# Patient Record
Sex: Male | Born: 1940
Health system: Southern US, Community
[De-identification: ages and names within clinical notes are randomized; demographics above are authoritative.]

## PROBLEM LIST (undated history)

## (undated) DIAGNOSIS — I10 Essential (primary) hypertension: Secondary | ICD-10-CM

## (undated) DIAGNOSIS — IMO0001 Reserved for inherently not codable concepts without codable children: Secondary | ICD-10-CM

## (undated) DIAGNOSIS — C801 Malignant (primary) neoplasm, unspecified: Secondary | ICD-10-CM

## (undated) DIAGNOSIS — E119 Type 2 diabetes mellitus without complications: Secondary | ICD-10-CM

## (undated) DIAGNOSIS — E7800 Pure hypercholesterolemia, unspecified: Secondary | ICD-10-CM

## (undated) HISTORY — DX: Reserved for inherently not codable concepts without codable children: IMO0001

---

## 1998-03-21 ENCOUNTER — Encounter: Payer: Self-pay | Admitting: Emergency Medicine

## 1998-03-21 ENCOUNTER — Emergency Department (HOSPITAL_COMMUNITY): Admission: EM | Admit: 1998-03-21 | Discharge: 1998-03-21 | Payer: Self-pay | Admitting: Emergency Medicine

## 1998-04-09 ENCOUNTER — Emergency Department (HOSPITAL_COMMUNITY): Admission: EM | Admit: 1998-04-09 | Discharge: 1998-04-09 | Payer: Self-pay | Admitting: Emergency Medicine

## 1998-04-09 ENCOUNTER — Encounter: Payer: Self-pay | Admitting: Emergency Medicine

## 1998-04-29 ENCOUNTER — Emergency Department (HOSPITAL_COMMUNITY): Admission: EM | Admit: 1998-04-29 | Discharge: 1998-04-29 | Payer: Self-pay | Admitting: Emergency Medicine

## 2003-01-29 HISTORY — PX: KNEE SURGERY: SHX244

## 2003-04-18 ENCOUNTER — Ambulatory Visit (HOSPITAL_BASED_OUTPATIENT_CLINIC_OR_DEPARTMENT_OTHER): Admission: RE | Admit: 2003-04-18 | Discharge: 2003-04-18 | Payer: Self-pay | Admitting: Orthopedic Surgery

## 2003-05-02 ENCOUNTER — Ambulatory Visit (HOSPITAL_COMMUNITY): Admission: RE | Admit: 2003-05-02 | Discharge: 2003-05-02 | Payer: Self-pay | Admitting: Orthopedic Surgery

## 2003-05-02 ENCOUNTER — Ambulatory Visit (HOSPITAL_BASED_OUTPATIENT_CLINIC_OR_DEPARTMENT_OTHER): Admission: RE | Admit: 2003-05-02 | Discharge: 2003-05-02 | Payer: Self-pay | Admitting: Orthopedic Surgery

## 2004-01-29 HISTORY — PX: CATARACT EXTRACTION, BILATERAL: SHX1313
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
## 2004-05-09 ENCOUNTER — Inpatient Hospital Stay (HOSPITAL_COMMUNITY): Admission: EM | Admit: 2004-05-09 | Discharge: 2004-05-11 | Payer: Self-pay | Admitting: Emergency Medicine
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
## 2008-01-29 DIAGNOSIS — IMO0001 Reserved for inherently not codable concepts without codable children: Secondary | ICD-10-CM

## 2008-01-29 HISTORY — DX: Reserved for inherently not codable concepts without codable children: IMO0001

## 2009-09-18 ENCOUNTER — Encounter: Payer: Self-pay | Admitting: Cardiology

## 2009-09-22 ENCOUNTER — Ambulatory Visit: Payer: Self-pay | Admitting: Cardiology

## 2009-09-22 DIAGNOSIS — E119 Type 2 diabetes mellitus without complications: Secondary | ICD-10-CM | POA: Insufficient documentation

## 2009-09-22 DIAGNOSIS — E785 Hyperlipidemia, unspecified: Secondary | ICD-10-CM | POA: Insufficient documentation

## 2009-09-22 DIAGNOSIS — I10 Essential (primary) hypertension: Secondary | ICD-10-CM | POA: Insufficient documentation

## 2009-09-22 DIAGNOSIS — E669 Obesity, unspecified: Secondary | ICD-10-CM | POA: Insufficient documentation

## 2009-09-25 ENCOUNTER — Ambulatory Visit: Payer: Self-pay | Admitting: Cardiology

## 2009-09-28 ENCOUNTER — Telehealth: Payer: Self-pay | Admitting: Cardiology

## 2009-09-29 LAB — CONVERTED CEMR LAB
ALT: 22 units/L (ref 0–53)
Alkaline Phosphatase: 59 units/L (ref 39–117)
Basophils Absolute: 0 10*3/uL (ref 0.0–0.1)
Cholesterol: 172 mg/dL (ref 0–200)
Eosinophils Absolute: 0.2 10*3/uL (ref 0.0–0.7)
Eosinophils Relative: 2 % (ref 0.0–5.0)
HCT: 44.5 % (ref 39.0–52.0)
HDL: 38.8 mg/dL — ABNORMAL LOW (ref >39.00)
Hgb A1c MFr Bld: 7.8 % — ABNORMAL HIGH (ref 4.6–6.5)
Lymphocytes Relative: 26.2 % (ref 12.0–46.0)
Monocytes Relative: 10.3 % (ref 3.0–12.0)
Platelets: 294 10*3/uL (ref 150.0–400.0)
RDW: 18.5 % — ABNORMAL HIGH (ref 11.5–14.6)
Total Bilirubin: 0.6 mg/dL (ref 0.3–1.2)
Total Protein: 7.5 g/dL (ref 6.0–8.3)
VLDL: 40 mg/dL (ref 0.0–40.0)

## 2009-10-24 ENCOUNTER — Ambulatory Visit: Payer: Self-pay | Admitting: Cardiology

## 2009-11-01 ENCOUNTER — Telehealth (INDEPENDENT_AMBULATORY_CARE_PROVIDER_SITE_OTHER): Payer: Self-pay | Admitting: *Deleted

## 2009-11-06 ENCOUNTER — Ambulatory Visit: Payer: Self-pay | Admitting: Family Medicine

## 2009-11-06 DIAGNOSIS — M109 Gout, unspecified: Secondary | ICD-10-CM | POA: Insufficient documentation

## 2009-12-27 ENCOUNTER — Ambulatory Visit: Payer: Self-pay | Admitting: Family Medicine

## 2009-12-27 LAB — CONVERTED CEMR LAB: Hgb A1c MFr Bld: 6.8 %

## 2010-02-21 ENCOUNTER — Encounter (INDEPENDENT_AMBULATORY_CARE_PROVIDER_SITE_OTHER): Payer: Self-pay | Admitting: *Deleted

## 2010-02-26 ENCOUNTER — Ambulatory Visit
Admission: RE | Admit: 2010-02-26 | Discharge: 2010-02-26 | Payer: Self-pay | Source: Home / Self Care | Attending: Family Medicine | Admitting: Family Medicine

## 2010-02-26 DIAGNOSIS — M1990 Unspecified osteoarthritis, unspecified site: Secondary | ICD-10-CM | POA: Insufficient documentation

## 2010-03-01 NOTE — Assessment & Plan Note (Signed)
Summary: Arthur [name]   Vital Signs:  Patient profile:   70 year old male Height:      71 inches Weight:      201.8 pounds BMI:     28.25 Temp:     98.4 degrees F oral Pulse rate:   81 / minute BP sitting:   174 / 85  (left arm) Cuff size:   regular  Vitals Entered By: Levert Feinstein LPN (November 30, 624THL 11:12 AM) CC: f/u Is Patient Diabetic? Yes Did you bring your meter with you today? No Pain Assessment Patient in pain? no        Primary Care Provider:  Mariana Arn MD   CC:  f/u.  History of Present Illness: Current Problems:    DYSLIPIDEMIA (ICD-272.4) taking pravastatin.  No diffuse muscle aches or right upper quadrant pain  DM (ICD-250.00) Disease Monitoring Blood Sugar ranges:no fasting > 130   Polyuria:N   Visual problems:N Medications Compliance:knows all meds   Hypoglycemic symptoms:N  ESSENTIAL HYPERTENSION, BENIGN (ICD-401.1) Disease Monitoring   Blood pressure range:has machine at home but not using       Chest pain: N     Dyspnea:N Medications   Compliance: brings in meds and seems to know names and dosages   Lightheadedness: N (see dizzynes beloe     Edema:N  Dizzyness has brief episodes 1-2 min of vertigo especially when sits up from bed or moves head rapidly.  No visioin changes or focal weakness or loss of consciosness.  Has had on and off for years and has an old rx for meclizine.  ROS - as above PMH - Medications reviewed and updated in medication list.  Smoking Status noted in VS form    Habits & Providers  Alcohol-Tobacco-Diet     Tobacco Status: never  Current Medications (verified): 1)  Omeprazole 20 Mg Cpdr (Omeprazole) .Marland Kitchen.. 1 By Mouth Daily As Needed For Heartburn 2)  Pravastatin Sodium 40 Mg Tabs (Pravastatin Sodium) .Marland Kitchen.. 1 By Mouth Daily 3)  Metformin Hcl 500 Mg Tabs (Metformin Hcl) .Marland Kitchen.. 1 By Mouth Bid 4)  Glipizide 5 Mg Tabs (Glipizide) .Marland Kitchen.. 1 By Mouth Daily 5)  Cozaar 50 Mg Tabs (Losartan Potassium) .... Take One Tablet By Mouth  Daily 6)  Amlodipine Besylate 10 Mg Tabs (Amlodipine Besylate) .... One Daily 7)  Atenolol 50 Mg Tabs (Atenolol) .... 1/2 Tab Two Times A Day 8)  Meclizine Hcl 25 Mg Tabs (Meclizine Hcl) .Marland Kitchen.. 1 Three Times A Day As Needed  Allergies: 1)  ! Ace Inhibitors  Physical Exam  General:  Well-developed,well-nourished,in no acute distress; alert,appropriate and cooperative throughout examination Ears:  External ear exam shows no significant lesions or deformities.  Otoscopic examination reveals clear canals, tympanic membranes are intact bilaterally without bulging, retraction, inflammation or discharge. Hearing is grossly normal bilaterally. Lungs:  Normal respiratory effort, chest expands symmetrically. Lungs are clear to auscultation, no crackles or wheezes. Heart:  Normal rate and regular rhythm. S1 and S2 normal without gallop, murmur, click, rub or other extra sounds. Extremities:  no edema  Neurologic:  moves all extremities equally.  Normal gait and movments    Impression & Recommendations:  Problem # 1:  INTERMITTENT VERTIGO (ICD-780.4)  consistent with BPPV.  No signs of intracranial disease.  Can use meclizine as needed   His updated medication list for this problem includes:    Meclizine Hcl 25 Mg Tabs (Meclizine hcl) .Marland Kitchen... 1 three times a day as needed  Orders: [place]- Est  Level  4 [id number])  Problem # 2:  DM (ICD-250.00) well controlled   His updated medication list for this problem includes:    Metformin Hcl 500 Mg Tabs (Metformin hcl) .Marland Kitchen... 1 by mouth bid    Glipizide 5 Mg Tabs (Glipizide) .Marland Kitchen... 1 by mouth daily    Cozaar 50 Mg Tabs (Losartan potassium) .Marland Kitchen... Take one tablet by mouth daily  Orders: A1C-[facility] [id number]) [place]- Est  Level 4 [id number])  Reviewed HgBA1c results: 6.8 (12/27/2009)  7.8 (09/25/2009)  Problem # 3:  ESSENTIAL HYPERTENSION, BENIGN (ICD-401.1)  not controlled today but with dizzyness and perhaps white coat hypertension recommend them follow it at home.  If  remains high would increase cozaar.  Needs BMET next visit   His updated medication list for this problem includes:    Cozaar 50 Mg Tabs (Losartan potassium) .Marland Kitchen... Take one tablet by mouth daily    Amlodipine Besylate 10 Mg Tabs (Amlodipine besylate) ..... One daily    Atenolol 50 Mg Tabs (Atenolol) .Marland Kitchen... 1/2 tab two times a day  BP today: 174/85 Prior BP: 176/74 (11/06/2009)  Labs Reviewed: Chol: 172 (09/25/2009)   HDL: 38.80 (09/25/2009)   LDL: 93 (09/25/2009)   TG: 200.0 (09/25/2009)  Orders: [place]- Est  Level 4 [id number])  Problem # 4:  GOUT, UNSPECIFIED (ICD-274.9)  no attacks for years and was taking allopurinol irregularly will stop  The following medications were removed from the medication list:    Allopurinol 300 Mg Tabs (Allopurinol) ..... One tab by mouth qday  Problem # 5:  Preventive Health Care (ICD-V70.0) next visit needs to discuss colon cancer screen and immunizations   Complete Medication List: 1)  Omeprazole 20 Mg Cpdr (Omeprazole) .Marland Kitchen.. 1 by mouth daily as needed for heartburn 2)  Pravastatin Sodium 40 Mg Tabs (Pravastatin sodium) .Marland Kitchen.. 1 by mouth daily 3)  Metformin Hcl 500 Mg Tabs (Metformin hcl) .Marland Kitchen.. 1 by mouth bid 4)  Glipizide 5 Mg Tabs (Glipizide) .Marland Kitchen.. 1 by mouth daily 5)  Cozaar 50 Mg Tabs (Losartan potassium) .... Take one tablet by mouth daily 6)  Amlodipine Besylate 10 Mg Tabs (Amlodipine besylate) .... One daily 7)  Atenolol 50 Mg Tabs (Atenolol) .... 1/2 tab two times a day 8)  Meclizine Hcl 25 Mg Tabs (Meclizine hcl) .Marland Kitchen.. 1 three times a day as needed  Patient Instructions: 1)  Come back in 2 months to see Dr Sherilyn Cooter 2)  Write down your blood pressure at home every other day and bring in your readings to Dr Sherilyn Cooter next visit 3)  If your blood pressure is regularly > 140/90 then call us 4)  Your A1c is 6.8 which is very good  5)  Stop taking allopurinol 6)  Take meclizine as needed for dizzyness   Orders Added: 1)  A1C-[facility] [83036] 2)  [facility]- Est   Level 4 [id number]    Laboratory Results   Blood Tests   Date/Time Received: December 27, 2009 11:16 AM  Date/Time Reported: December 27, 2009 11:43 AM   HGBA1C: 6.8%   (Normal Range: Non-Diabetic - 3-6%   Control Diabetic - 6-8%)  Comments: .......test performed by........Marland Kitchen Levert Feinstein, LPN entered by Hedy Camara, CMA         Prevention & Chronic Care Immunizations   Influenza vaccine: Not documented    Tetanus booster: Not documented    Pneumococcal vaccine: Not documented    H. zoster vaccine: Not documented  Colorectal Screening   Hemoccult: Not documented    Colonoscopy: Not documented  Other  Screening   PSA: Not documented   Smoking status: never  (12/27/2009)  Diabetes Mellitus   HgbA1C: 6.8  (12/27/2009)    Eye exam: Not documented    Foot exam: yes  (11/06/2009)   High risk foot: Not documented   Foot care education: Not documented    Urine microalbumin/creatinine ratio: Not documented    Diabetes flowsheet reviewed?: Yes   Progress toward A1C goal: At goal  Lipids   Total Cholesterol: 172  (09/25/2009)   LDL: 93  (09/25/2009)   LDL Direct: Not documented   HDL: 38.80  (09/25/2009)   Triglycerides: 200.0  (09/25/2009)    SGOT (AST): 24  (09/25/2009)   SGPT (ALT): 22  (09/25/2009)   Alkaline phosphatase: 59  (09/25/2009)   Total bilirubin: 0.6  (09/25/2009)    Lipid flowsheet reviewed?: Yes   Progress toward LDL goal: At goal  Hypertension   Last Blood Pressure: 174 / 85  (12/27/2009)   Serum creatinine: Not documented   Serum potassium Not documented    Hypertension flowsheet reviewed?: Yes   Progress toward BP goal: Unchanged  Self-Management Support :   Personal Goals (by the next clinic visit) :     Personal A1C goal: 7  (11/06/2009)     Personal blood pressure goal: 130/80  (11/06/2009)     Personal LDL goal: 70  (11/06/2009)    Diabetes self-management support: Not documented    Hypertension self-management support:  Not documented    Lipid self-management support: Not documented

## 2010-03-01 NOTE — Assessment & Plan Note (Signed)
Summary: np6/ b/p issues / pt has medicare/ gd   Visit Type:  Follow-up  CC:  HTN.  History of Present Illness: The patient is referred for evaluation of difficult to control hypertension. He has had hypertension for years but it has been somewhat labile particularly recently. He says he has been watching it very closely and it has been consistently in the [id number] at least systolic. He has not altered his medications. He's had no other change in his diet or weight. He denies any new cardiovascular symptoms such as chest pressure, neck or arm discomfort. He has had no palpitations, presyncope or syncope. He has had no PND or orthopnea. He has had a persistently dry cough and it was suggested that he might have developed allergies. He has had no weight gain or edema.  He does describe a stress perfusion study apparently last year in [facility] which he said was negative.  Current Medications (verified): 1)  Omeprazole 20 Mg Cpdr (Omeprazole) .Marland Kitchen.. 1 By Mouth Daily 2)  Pravastatin Sodium 40 Mg Tabs (Pravastatin Sodium) .Marland Kitchen.. 1 By Mouth Daily 3)  Atenolol 50 Mg Tabs (Atenolol) .... 2 By Mouth Daily 4)  Metformin Hcl 500 Mg Tabs (Metformin Hcl) .Marland Kitchen.. 1 By Mouth Bid 5)  Glipizide 5 Mg Tabs (Glipizide) .Marland Kitchen.. 1 By Mouth Daily 6)  Lotensin Hct 20-12.5 Mg Tabs (Benazepril-Hydrochlorothiazide) .... 1/2 By Mouth Daily 7)  Allopurinol 300 Mg Tabs (Allopurinol) .Marland Kitchen.. 1 By Mouth Daily 8)  Nabumetone 500 Mg Tabs (Nabumetone) .... 2 By Mouth Daily 9)  Integra Plus  Caps (Fefum-Fepoly-Fa-B Cmp-C-Biot) .Marland Kitchen.. 1 By Mouth Daily  Allergies (verified): No Known Drug Allergies  Past History:  Past Medical History: Diabetes mellitus x18 years Hypertension x18 years Hyperlipidemia x18 years  Past Surgical History: past surgical history knee surgery  Family History: Positive for his father dying of a myocardial infarction at age 49.  His mother had an MI at age 24.  Social History: He works part-time as a  Doctor, general practice. He is married with 3 children. He's never smoked cigarettes and does not drink alcohol.  Review of Systems       Positive for occasional dizziness, reflux. Otherwise as stated in the history of present illness negative for all other systems.  Vital Signs:  Patient profile:   70 year old male Height:      71 inches Weight:      204 pounds BMI:     28.56 Pulse rate:   81 / minute Resp:     16 per minute BP sitting:   142 / 92  (right arm)  Vitals Entered By: Levora Angel, CNA (September 22, 2009 12:28 PM)  Physical Exam  General:  Well developed, well nourished, in no acute distress. Head:  normocephalic and atraumatic Eyes:  PERRLA/EOM intact; conjunctiva and lids normal. Mouth:  Teeth, gums and palate normal. Oral mucosa normal. Neck:  Neck supple, no JVD. No masses, thyromegaly or abnormal cervical nodes. Chest Wall:  no deformities or breast masses noted Lungs:  Clear bilaterally to auscultation and percussion. Abdomen:  Bowel sounds positive; abdomen soft and non-tender without masses, organomegaly, or hernias noted. No hepatosplenomegaly. Msk:  Back normal, normal gait. Muscle strength and tone normal. Extremities:  No clubbing or cyanosis. Neurologic:  Alert and oriented x 3. Skin:  Intact without lesions or rashes. Cervical Nodes:  no significant adenopathy Axillary Nodes:  no significant adenopathy Inguinal Nodes:  no significant adenopathy Psych:  Normal affect.   Detailed Cardiovascular Exam  Neck    Carotids: Carotids full and equal bilaterally without bruits.      Neck Veins: Normal, no JVD.    Heart    Inspection: no deformities or lifts noted.      Palpation: normal PMI with no thrills palpable.      Auscultation: regular rate and rhythm, S1, S2 without murmurs, rubs, gallops, or clicks.    Vascular    Abdominal Aorta: no palpable masses, pulsations, or audible bruits.      Femoral Pulses: normal femoral pulses bilaterally.      Pedal Pulses:  normal pedal pulses bilaterally.      Radial Pulses: normal radial pulses bilaterally.      Peripheral Circulation: no clubbing, cyanosis, or edema noted with normal capillary refill.     EKG  Procedure date:  09/18/2009  Findings:      Sinus rhythm, rate 73, axis within normal limits, intervals within normal limits, no acute ST-T wave changes  Impression & Recommendations:  Problem # 1:  ESSENTIAL HYPERTENSION, BENIGN (ICD-401.1)  Today I will change his Lotensin to Cozaar 50 mg daily.  We will see if this helps with his cough. I have given him instructions on how to keep a blood pressure diary. Further adjustments will be based on these readings. I also discussed with him therapeutic lifestyle changes to include weight loss, exercise and salt restriction.  His updated medication list for this problem includes:    Atenolol 50 Mg Tabs (Atenolol) .Marland Kitchen... 2 by mouth daily    Cozaar 50 Mg Tabs (Losartan potassium) .Marland Kitchen... Take one tablet by mouth daily  Problem # 2:  OBESITY, UNSPECIFIED (ICD-278.00) This was addressed as above. I gave him specific instructions on exercise.  Problem # 3:  DM (ICD-250.00)  I will check a hemoglobin A1c. For now he will continue his medications as listed.  His updated medication list for this problem includes:    Metformin Hcl 500 Mg Tabs (Metformin hcl) .Marland Kitchen... 1 by mouth bid    Glipizide 5 Mg Tabs (Glipizide) .Marland Kitchen... 1 by mouth daily    Cozaar 50 Mg Tabs (Losartan potassium) .Marland Kitchen... Take one tablet by mouth daily  Problem # 4:  DYSLIPIDEMIA (ICD-272.4)  I will check a fasting lipid profile and treat accordingly.  His updated medication list for this problem includes:    Pravastatin Sodium 40 Mg Tabs (Pravastatin sodium) .Marland Kitchen... 1 by mouth daily  Patient Instructions: 1)  Your physician recommends that you schedule a follow-up appointment in: 1 month with Dr. Percival Spanish 2)  Your physician recommends that you return for a FASTING lipid profile, liver, tsh,  cbc, HbA1C  on Monday 3)  Your physician has recommended you make the following change in your medication: STOP Lotensin  START Cozaar Prescriptions: COZAAR 50 MG TABS (LOSARTAN POTASSIUM) Take one tablet by mouth daily  #30 x 6   Entered by:   Alvis Lemmings, RN, BSN   Authorized by:   Minus Breeding, MD, [facility]   Signed by:   Alvis Lemmings, RN, BSN on 09/22/2009   Method used:   Electronically to        [facility] (retail)       [address] [place], [place]  [address]       Ph: [phone]       Fax: [phone]   [id number]   [phone]  I have reviewed and approved all prescriptions  at the time of this visit. Minus Breeding, MD, [facility]  September 22, 2009 1:33 PM

## 2010-03-01 NOTE — Letter (Signed)
Summary: Generic Letter  [facility]  [address]   [place], [place] [address]   Phone: [phone]  Fax: [phone]    02/21/2010  [address] [place], [place]  [address]  Dear Mr. [facility],  We are happy to let you know that since you are covered under Medicare you are able to have a FREE visit at the [facility] to discuss your HEALTH. This is a new benefit for Medicare.  There will be no co-payment.  At this visit you will meet with Lamont Dowdy an expert in wellness and the health coach at our clinic.  At this visit we will discuss ways to keep you healthy and feeling well.  This visit will not replace your regular doctor visit and we cannot refill medications.     You will need to plan to be here at least one hour to talk about your medical history, your current status, review all of your medications, and discuss your future plans for your health.  This information will be entered into your record for your doctor to have and review.  If you are interested in staying healthy, this type of visit can help.  Please call the office at: [phone], to schedule a "Medicare Wellness Visit".  The day of the visit you should bring in all of your medications, including any vitamins, herbs, over the counter products you take.  Make a list of all the other doctors that you see, so we know who they are. If you have any other health documents please bring them.  We look forward to helping you stay healthy.  Sincerely,   Suzanne Lineberry Donna

## 2010-03-01 NOTE — Progress Notes (Signed)
Summary: lab results  pt aware  Phone Note Call from Patient   Caller: Patient Reason for Call: Talk to Nurse, Lab or Test Results Summary of Call: pt calling for lab results and would like copy mailed to him for his pcp-pls call [phone] Initial call taken by: Lorenda Hatchet,  September 28, 2009 12:13 PM  Follow-up for Phone Call        called pt with results.  He is aware MD to review and we will call back if medication changes are indicated. Follow-up by: Sim Boast, RN,  September 28, 2009 12:22 PM

## 2010-03-01 NOTE — Assessment & Plan Note (Signed)
Summary: per check out.sf   Visit Type:  Follow-up  CC:  HTN.  History of Present Illness: The patient presents for evaluation of difficult to control hypertension. He was also complaining of cough recently. Therefore, I switched him off of Lotensin to Cozaar.  He reports that he has had resolution of his cough though there is some nasal drainage that started just the last couple of days. His blood pressures have been in the 140s or so at home though he doesn't bring a blood pressure diary. His blood pressures are elevated today. He very much wants to stop his atenolol as he thinks this is not working. He is not with any family members today and though he expresses understanding of our conversation and very concerned that he doesn't understand how to answer questions or what him explaining. He is denying any chest pressure, neck or arm discomfort. He is denying any palpitations, presyncope or syncope. He is not having any PND or orthopnea.  Current Medications (verified): 1)  Omeprazole 20 Mg Cpdr (Omeprazole) .Marland Kitchen.. 1 By Mouth Daily 2)  Pravastatin Sodium 40 Mg Tabs (Pravastatin Sodium) .Marland Kitchen.. 1 By Mouth Daily 3)  Atenolol 50 Mg Tabs (Atenolol) .... 2 By Mouth Daily 4)  Metformin Hcl 500 Mg Tabs (Metformin Hcl) .Marland Kitchen.. 1 By Mouth Bid 5)  Glipizide 5 Mg Tabs (Glipizide) .Marland Kitchen.. 1 By Mouth Daily 6)  Cozaar 50 Mg Tabs (Losartan Potassium) .... Take One Tablet By Mouth Daily 7)  Allopurinol 300 Mg Tabs (Allopurinol) .... Out 8)  Nabumetone 500 Mg Tabs (Nabumetone) .... 2 By Mouth Daily 9)  Integra Plus  Caps (Fefum-Fepoly-Fa-B Cmp-C-Biot) .Marland Kitchen.. 1 By Mouth Daily  Allergies (verified): No Known Drug Allergies  Past History:  Past Medical History: Reviewed history from 09/22/2009 and no changes required. Diabetes mellitus x18 years Hypertension x18 years Hyperlipidemia x18 years  Past Surgical History: Knee surgery  Review of Systems       As stated in the HPI and negative for all other  systems.   Vital Signs:  Patient profile:   70 year old male Height:      71 inches Weight:      206 pounds BMI:     28.84 Pulse rate:   73 / minute Resp:     16 per minute BP sitting:   201 / 97  (right arm)  Vitals Entered By: Levora Angel, CNA (October 24, 2009 10:40 AM)  Physical Exam  General:  Well developed, well nourished, in no acute distress. Head:  normocephalic and atraumatic Mouth:  Teeth, gums and palate normal. Oral mucosa normal. Neck:  Neck supple, no JVD. No masses, thyromegaly or abnormal cervical nodes. Chest Wall:  no deformities or breast masses noted Lungs:  Clear bilaterally to auscultation and percussion. Abdomen:  Bowel sounds positive; abdomen soft and non-tender without masses, organomegaly, or hernias noted. No hepatosplenomegaly. Msk:  Back normal, normal gait. Muscle strength and tone normal. Extremities:  No clubbing or cyanosis. Neurologic:  Alert and oriented x 3. Skin:  Intact without lesions or rashes. Cervical Nodes:  no significant adenopathy Psych:  Normal affect.   Detailed Cardiovascular Exam  Neck    Carotids: Carotids full and equal bilaterally without bruits.      Neck Veins: Normal, no JVD.    Heart    Inspection: no deformities or lifts noted.      Palpation: normal PMI with no thrills palpable.      Auscultation: regular rate and rhythm, S1, S2 without  murmurs, rubs, gallops, or clicks.    Vascular    Abdominal Aorta: no palpable masses, pulsations, or audible bruits.      Femoral Pulses: normal femoral pulses bilaterally.      Pedal Pulses: normal pedal pulses bilaterally.      Radial Pulses: normal radial pulses bilaterally.      Peripheral Circulation: no clubbing, cyanosis, or edema noted with normal capillary refill.     Impression & Recommendations:  Problem # 1:  ESSENTIAL HYPERTENSION, BENIGN (ICD-401.1) He does not have a cough he was having so I think the ARB is a better choice than the ACE inhibitor. He  is still not at target with his heart rate. He insists on coming off atenolol as he thinks it's not working. I think it is reasonable to switch to Norvasc 10 mg daily. He would not understand the instruction to wean the beta blocker so we will simply stop it. I will call his son to reiterate these changes and to emphasize side effects such as edema. He needs to keep and I am his blood pressure and bring a diary with him. He should have a family member with him when he is presently these appointments.  Problem # 2:  OBESITY, UNSPECIFIED (ICD-278.00) We discussed weight loss. He wanted a pill. I explained that this is not the way and that there are no medications that we'll offer long-term help. I reiterated diet and exercise.  Problem # 3:  COUGH (ICD-786.2) His cough is improved. He will continue the meds as listed.  Patient Instructions: 1)  Your physician recommends that you schedule a follow-up appointment in: 1 month with Dr Percival Spanish 2)  Your physician has recommended you make the following change in your medication: Stop Atenolol, start Norvasc (Amlodipine) 10 mg a day Prescriptions: AMLODIPINE BESYLATE 10 MG TABS (AMLODIPINE BESYLATE) one daily  #90 x 3   Entered by:   Sim Boast, RN   Authorized by:   Minus Breeding, MD, [facility]   Signed by:   Sim Boast, RN on 10/24/2009   Method used:   Electronically to        [facility] (retail)       [address] [place], [place]  [address]       Ph: [phone]       Fax: [phone]   [id number]  I have reviewed and approved all prescriptions at the time of this visit.Minus Breeding, MD, [facility]  October 24, 2009 11:32 AM

## 2010-03-01 NOTE — Letter (Signed)
Summary: [facility] Interpretation - CXR  [facility]ation - CXR   Imported By: Marilynne Drivers 12/19/2009 14:39:24  _____________________________________________________________________  External Attachment:    Type:   Image     Comment:   External Document

## 2010-03-01 NOTE — Progress Notes (Signed)
  Release of Information Sent Mailed to Pt  [facility]  November 01, 2009 9:32 AM

## 2010-03-01 NOTE — Assessment & Plan Note (Signed)
Summary: NP,tcb   Vital Signs:  Patient profile:   70 year old male Height:      71 inches Weight:      207 pounds BMI:     28.97 Temp:     99.9 degrees F oral Pulse rate:   98 / minute BP sitting:   176 / 74  (left arm) Cuff size:   large  Vitals Entered By: Enid Skeens, CMA (November 06, 2009 2:26 PM) CC: New patient visit Is Patient Diabetic? Yes Did you bring your meter with you today? No   Primary Care Provider:  Mariana Arn MD   CC:  New patient visit.  History of Present Illness: 1) HTN: BP 176/74 today. Started on Cozaar 50 mg daily as below in 8/11 secondary to chronic cough with ACE-I (now improved). Started on Norvasc 10 mg in place of Atenolol in 9/11 (though he brings in his bottle of Atenolol as a medication he is taking today, but it seems as though he is taking this intermittently). Denies chest pain, LE edema, chronic cough, dyspnea, headache, focal neurological signs.   2) HLD: Last lipid panel in 8/11 - trig = 200, HDL 39, LDL = 93. On Pravastatin as below without side effects. Family history of CAD as below.   3) DM2: A1c in 8/11 = 7.8. Taking metformin and glipizide as below. Denies polyuria, polydipsia, vision change, numbness  4) Gout: Started on allopurinol in [place] for presumed gout. No uric acid levels available. No tophi or current gouty arthropathy.   Med rec:   - Omperazole 20 mg daily   - Pravastatin 40 mg    - Metformin 500 mg two times a day   - Glipizide 5 mg daily   - Allopurinol 300 mg daily   - Iron polysaccharide one tab daily   - Losartan 50 mg daily   - Norvasc 10 mg  - Atenolol 25 mg intermittently  Habits & Providers  Alcohol-Tobacco-Diet     Tobacco Status: never  Allergies (verified): 1)  ! Ace Inhibitors  Past History:  Past Medical History: Diabetes mellitus x18 years Hypertension x18 years Hyperlipidemia x18 years Gout   Past Surgical History: Right knee surgery 2005 Cataract surgery (bilateral) 2006  Social  History: Smoking Status:  never  Physical Exam  General:  overweight, pleasant gentleman, occasionally difficult to understand / difficulty understanding my instructions and questions, NAD   Eyes:  pupils equal, round and reactive to light, extraoccular movements intact, limited funduscopy normal bilaterally  Mouth:  Teeth, gums and palate normal. Oral mucosa normal. Neck:  Neck supple, no JVD or carotid bruits No masses, thyromegaly or abnormal cervical nodes. Lungs:  Clear bilaterally to auscultation and percussion. Heart:  RRR, no mumrurs  Abdomen:  Bowel sounds positive; abdomen soft and non-tender without masses, organomegaly, or hernias noted. No hepatosplenomegaly. Pulses:  2+ radials  Extremities:  no edema  Neurologic:  Alert and oriented x 3; cranial nerves II-XII intact;  cranial nerves II-XII intact and sensation intact to light touch.    Diabetes Management Exam:    Foot Exam (with socks and/or shoes not present):       Sensory-Pinprick/Light touch:          Left medial foot (L-4): normal          Left dorsal foot (L-5): normal          Left lateral foot (S-1): normal          Right medial foot (  L-4): normal          Right dorsal foot (L-5): normal          Right lateral foot (S-1): normal       Sensory-Monofilament:          Left foot: normal          Right foot: normal       Inspection:          Left foot: normal          Right foot: normal       Nails:          Left foot: normal          Right foot: normal   Impression & Recommendations:  Problem # 1:  DYSLIPIDEMIA (ICD-272.4) Not at goal with DM2. Discussed diet and exercise modification. Will follow. Consider change vs. increase statin.   His updated medication list for this problem includes:    Pravastatin Sodium 40 Mg Tabs (Pravastatin sodium) .Marland Kitchen... 1 by mouth daily  Labs Reviewed: SGOT: 24 (09/25/2009)   SGPT: 22 (09/25/2009)   HDL:38.80 (09/25/2009)  LDL:93 (09/25/2009)  Chol:172 (09/25/2009)   Trig:200.0 (09/25/2009)  Problem # 2:  DM (ICD-250.00)  Continue medications as below. A1C in one month. Near goal. Discussed diet and exercise modification. I spent 30 minutes in the care and manamegent of this patient. Will self-refer for diabetic eye exam.   His updated medication list for this problem includes:    Metformin Hcl 500 Mg Tabs (Metformin hcl) .Marland Kitchen... 1 by mouth bid    Glipizide 5 Mg Tabs (Glipizide) .Marland Kitchen... 1 by mouth daily    Cozaar 50 Mg Tabs (Losartan potassium) .Marland Kitchen... Take one tablet by mouth daily  Reviewed HgBA1c results: 7.8 (09/25/2009)  Problem # 3:  ESSENTIAL HYPERTENSION, BENIGN (ICD-401.1) Advised patient to take atenolol as below. Not at goal of 130/70 with DM2. Follow up in one month. Continue amlodipine, losartan as below. Advised regarding increasing exercise, DASH diet. I spent 30 minutes in the care of this patient.   His updated medication list for this problem includes:    Cozaar 50 Mg Tabs (Losartan potassium) .Marland Kitchen... Take one tablet by mouth daily    Amlodipine Besylate 10 Mg Tabs (Amlodipine besylate) ..... One daily    Atenolol 50 Mg Tabs (Atenolol) ..... One tab by mouth qday  BP today: 176/74 Prior BP: 201/97 (10/24/2009)  Labs Reviewed: Chol: 172 (09/25/2009)   HDL: 38.80 (09/25/2009)   LDL: 93 (09/25/2009)   TG: 200.0 (09/25/2009)  Complete Medication List: 1)  Omeprazole 20 Mg Cpdr (Omeprazole) .Marland Kitchen.. 1 by mouth daily as needed for heartburn 2)  Pravastatin Sodium 40 Mg Tabs (Pravastatin sodium) .Marland Kitchen.. 1 by mouth daily 3)  Metformin Hcl 500 Mg Tabs (Metformin hcl) .Marland Kitchen.. 1 by mouth bid 4)  Glipizide 5 Mg Tabs (Glipizide) .Marland Kitchen.. 1 by mouth daily 5)  Cozaar 50 Mg Tabs (Losartan potassium) .... Take one tablet by mouth daily 6)  Allopurinol 300 Mg Tabs (Allopurinol) .... One tab by mouth qday 7)  Amlodipine Besylate 10 Mg Tabs (Amlodipine besylate) .... One daily 8)  Atenolol 50 Mg Tabs (Atenolol) .... One tab by mouth qday  Patient Instructions: 1)   It was great to see you today!  2)  Take one whole 50 mg tablet of your ATENOLOL each day 3)  STOP your Integra tablets  4)  STOP your nabumetone  5)  Follow up in one month. 6)  Take your Nexium as  needed  7)  Make an appointment with your eye doctor to have a [place]    Prevention & Chronic Care Immunizations   Influenza vaccine: Not documented    Tetanus booster: Not documented    Pneumococcal vaccine: Not documented    H. zoster vaccine: Not documented  Colorectal Screening   Hemoccult: Not documented    Colonoscopy: Not documented  Other Screening   PSA: Not documented   Smoking status: never  (11/06/2009)  Diabetes Mellitus   HgbA1C: 7.8  (09/25/2009)    Eye exam: Not documented    Foot exam: yes  (11/06/2009)   High risk foot: Not documented   Foot care education: Not documented    Urine microalbumin/creatinine ratio: Not documented    Diabetes flowsheet reviewed?: Yes   Progress toward A1C goal: Unchanged  Lipids   Total Cholesterol: 172  (09/25/2009)   LDL: 93  (09/25/2009)   LDL Direct: Not documented   HDL: 38.80  (09/25/2009)   Triglycerides: 200.0  (09/25/2009)    SGOT (AST): 24  (09/25/2009)   SGPT (ALT): 22  (09/25/2009)   Alkaline phosphatase: 59  (09/25/2009)   Total bilirubin: 0.6  (09/25/2009)    Lipid flowsheet reviewed?: Yes   Progress toward LDL goal: Unchanged  Hypertension   Last Blood Pressure: 176 / 74  (11/06/2009)   Serum creatinine: Not documented   Serum potassium Not documented    Hypertension flowsheet reviewed?: Yes   Progress toward BP goal: Improved  Self-Management Support :   Personal Goals (by the next clinic visit) :     Personal A1C goal: 7  (11/06/2009)     Personal blood pressure goal: 130/80  (11/06/2009)     Personal LDL goal: 70  (11/06/2009)    Patient will work on the following items until the next clinic visit to reach self-care goals:     Medications and monitoring: take my medicines  every day, check my blood sugar, check my blood pressure, bring all of my medications to every visit, weigh myself weekly, examine my feet every day  (11/06/2009)     Eating: drink diet soda or water instead of juice or soda, eat more vegetables, use fresh or frozen vegetables, eat foods that are low in salt, eat baked foods instead of fried foods, eat fruit for snacks and desserts  (11/06/2009)     Activity: take a 30 minute walk every day  (11/06/2009)    Diabetes self-management support: Not documented    Hypertension self-management support: Not documented    Lipid self-management support: Not documented

## 2010-03-07 NOTE — Assessment & Plan Note (Signed)
Summary: F/U   CAREW NOT AVAIL/KH   Vital Signs:  Patient profile:   70 year old male Height:      71 inches Weight:      202.8 pounds BMI:     28.39 Temp:     98.1 degrees F oral Pulse rate:   83 / minute BP sitting:   160 / 75  (left arm) Cuff size:   regular  Vitals Entered By: Levert Feinstein LPN (January 30, [id number] 2:23 PM) CC: f/u bp Is Patient Diabetic? Yes Did you bring your meter with you today? No Pain Assessment Patient in pain? no        Primary Care Provider:  Mariana Arn MD   CC:  f/u bp.  History of Present Illness: HYPERTENSION Disease Monitoring Blood pressure range:his checks imtermittently but did not bring any readings   Chest pain:N   Dyspnea:N Medications Compliance:brings in meds and knows doses   Lightheadedness:N   Edema:N   DIABETES Disease Monitoring Blood Sugar ranges:highest is 120 no low onews   Polyuria:n   Visual problems:n Medications Compliance:daily   Hypoglycemic symptoms:N   HYPERLIPIDEMIA Disease Monitoring See symptoms for Hypertension Medications Compliance:daily pravastatin   RUQ pain:N   Muscle aches:n  ROS - as above PMH - Medications reviewed and updated in medication list.  Smoking Status noted in VS form    Habits & Providers  Alcohol-Tobacco-Diet     Tobacco Status: never  Current Medications (verified): 1)  Omeprazole 20 Mg Cpdr (Omeprazole) .Marland Kitchen.. 1 By Mouth Daily As Needed For Heartburn 2)  Pravastatin Sodium 40 Mg Tabs (Pravastatin Sodium) .Marland Kitchen.. 1 By Mouth Daily 3)  Metformin Hcl 1000 Mg Tabs (Metformin Hcl) .Marland Kitchen.. 1 Two Times A Day 4)  Glipizide 5 Mg Tabs (Glipizide) .... 1/2  By Mouth Daily 5)  Cozaar 50 Mg Tabs (Losartan Potassium) .... Take One Tablet By Mouth Daily 6)  Amlodipine Besylate 10 Mg Tabs (Amlodipine Besylate) .... One Daily 7)  Atenolol 50 Mg Tabs (Atenolol) .Marland Kitchen.. 1 Tab Two Times A Day 8)  Nabumetone 500 Mg Tabs (Nabumetone) .Marland Kitchen.. 1 As Needed  Allergies: 1)  ! Ace Inhibitors  Physical  Exam  General:  Well-developed,well-nourished,in no acute distress; alert,appropriate and cooperative throughout examination Lungs:  Normal respiratory effort, chest expands symmetrically. Lungs are clear to auscultation, no crackles or wheezes. Heart:  Normal rate and regular rhythm. S1 and S2 normal without gallop, murmur, click, rub or other extra sounds. Extremities:  no edema    Impression & Recommendations:  Problem # 1:  DM (ICD-250.00)  well controlled.  stop glipizide His updated medication list for this problem includes:    Metformin Hcl 1000 Mg Tabs (Metformin hcl) .Marland Kitchen... 1 two times a day    Glipizide 5 Mg Tabs (Glipizide) .Marland Kitchen... 1/2  by mouth daily    Cozaar 50 Mg Tabs (Losartan potassium) .Marland Kitchen... Take one tablet by mouth daily  Reviewed HgBA1c results: 6.8 (12/27/2009)  7.8 (09/25/2009)  Orders: [place]- Est  Level 4 [id number])  Problem # 2:  DYSLIPIDEMIA (ICD-272.4)  At goal  His updated medication list for this problem includes:    Pravastatin Sodium 40 Mg Tabs (Pravastatin sodium) .Marland Kitchen... 1 by mouth daily  Labs Reviewed: SGOT: 24 (09/25/2009)   SGPT: 22 (09/25/2009)   HDL:38.80 (09/25/2009)  LDL:93 (09/25/2009)  Chol:172 (09/25/2009)  Trig:200.0 (09/25/2009)  Orders: [place]- Est  Level 4 [id number])  Problem # 3:  ESSENTIAL HYPERTENSION, BENIGN (ICD-401.1)  elevated today but hard to tell baseline.  Will monitor may want increase cozaar His updated medication list for this problem includes:    Cozaar 50 Mg Tabs (Losartan potassium) .Marland Kitchen... Take one tablet by mouth daily    Amlodipine Besylate 10 Mg Tabs (Amlodipine besylate) ..... One daily    Atenolol 50 Mg Tabs (Atenolol) .Marland Kitchen... 1 tab two times a day  BP today: 160/75 Prior BP: 174/85 (12/27/2009)  Labs Reviewed: Chol: 172 (09/25/2009)   HDL: 38.80 (09/25/2009)   LDL: 93 (09/25/2009)   TG: 200.0 (09/25/2009)  Orders: [place]- Est  Level 4 [id number])  Problem # 4:  DEGENERATIVE JOINT DISEASE (ICD-715.90) Prn nsaid  His  updated medication list for this problem includes:    Nabumetone 500 Mg Tabs (Nabumetone) .Marland Kitchen... 1 as needed  Complete Medication List: 1)  Omeprazole 20 Mg Cpdr (Omeprazole) .Marland Kitchen.. 1 by mouth daily as needed for heartburn 2)  Pravastatin Sodium 40 Mg Tabs (Pravastatin sodium) .Marland Kitchen.. 1 by mouth daily 3)  Metformin Hcl 1000 Mg Tabs (Metformin hcl) .Marland Kitchen.. 1 two times a day 4)  Glipizide 5 Mg Tabs (Glipizide) .... 1/2  by mouth daily 5)  Cozaar 50 Mg Tabs (Losartan potassium) .... Take one tablet by mouth daily 6)  Amlodipine Besylate 10 Mg Tabs (Amlodipine besylate) .... One daily 7)  Atenolol 50 Mg Tabs (Atenolol) .Marland Kitchen.. 1 tab two times a day 8)  Nabumetone 500 Mg Tabs (Nabumetone) .Marland Kitchen.. 1 as needed  Patient Instructions: 1)  Please schedule a follow-up appointment in 1-2 month.  2)  Bring your blood pressure machine 3)  Write down your blood pressure every day or every other day and bring in your record 4)  Stop the Glipizide 5)  Consider a colonoscopy and the [place] Shot 6)  Your diabetes is doing very well  Prescriptions: PRAVASTATIN SODIUM 40 MG TABS (PRAVASTATIN SODIUM) 1 by mouth daily  #30 x 11   Entered and Authorized by:   Talbert Cage MD   Signed by:   Talbert Cage MD on 02/26/2010   Method used:   Electronically to        [facility] (retail)       [address] [place], [place]  [address]       Ph: [phone]       Fax: [phone]   RxID:   [id number] OMEPRAZOLE 20 MG CPDR (OMEPRAZOLE) 1 by mouth daily as needed for heartburn  #30 x 6   Entered and Authorized by:   Talbert Cage MD   Signed by:   Talbert Cage MD on 02/26/2010   Method used:   Electronically to        [facility] (retail)       [address] [place], [place]  [address]       Ph: [phone]       Fax: [phone]   RxID:   [id number]    Orders Added: 1)  [place]- Est  Level 4 [id number]     Prevention  & Chronic Care Immunizations   Influenza vaccine: Not documented    Tetanus booster: Not documented    Pneumococcal vaccine: Not documented    H. zoster vaccine: Not documented  Colorectal Screening   Hemoccult: Not documented    Colonoscopy: Not documented  Other Screening   PSA: Not documented   Smoking status: never  (02/26/2010)  Diabetes Mellitus   HgbA1C: 6.8  (12/27/2009)    Eye exam: Not documented    Foot exam: yes  (11/06/2009)   High risk foot: Not documented   Foot care education: Not documented    Urine microalbumin/creatinine ratio: Not documented    Diabetes flowsheet reviewed?: Yes   Progress toward A1C goal: At goal  Lipids   Total Cholesterol: 172  (09/25/2009)   LDL: 93  (09/25/2009)   LDL Direct: Not documented   HDL: 38.80  (09/25/2009)   Triglycerides: 200.0  (09/25/2009)    SGOT (AST): 24  (09/25/2009)   SGPT (ALT): 22  (09/25/2009)   Alkaline phosphatase: 59  (09/25/2009)   Total bilirubin: 0.6  (09/25/2009)    Lipid flowsheet reviewed?: Yes   Progress toward LDL goal: At goal  Hypertension   Last Blood Pressure: 160 / 75  (02/26/2010)   Serum creatinine: Not documented   Serum potassium Not documented    Hypertension flowsheet reviewed?: Yes   Progress toward BP goal: Unchanged  Self-Management Support :   Personal Goals (by the next clinic visit) :     Personal A1C goal: 7  (11/06/2009)     Personal blood pressure goal: 130/80  (11/06/2009)     Personal LDL goal: 70  (11/06/2009)    Diabetes self-management support: Not documented    Hypertension self-management support: Not documented    Lipid self-management support: Not documented

## 2010-03-12 ENCOUNTER — Telehealth: Payer: Self-pay | Admitting: Family Medicine

## 2010-03-13 ENCOUNTER — Other Ambulatory Visit: Payer: Self-pay | Admitting: Family Medicine

## 2010-03-13 MED ORDER — GLIPIZIDE 5 MG PO TABS: 5.0000 mg | ORAL_TABLET | Freq: Every day | ORAL | Status: DC

## 2010-03-13 NOTE — Telephone Encounter (Signed)
Spoke with patient and he states  MD stopped glipizide 02/26/2010. his BS prior to stopping were 110-115. Since stopping BS have been running 125-140. He called his pharmacy to refill the glipizide , Dr. Erin Hearing sent in refill. Patient wants to make sure he is to restart this med. Dr. Erin Hearing notified and he will talk with patient.

## 2010-03-13 NOTE — Telephone Encounter (Signed)
Spoke with pt.  Will take 1/2 tablet daily of glipizide Arthur [name]

## 2010-04-11 ENCOUNTER — Ambulatory Visit (INDEPENDENT_AMBULATORY_CARE_PROVIDER_SITE_OTHER): Payer: Medicare Other | Admitting: Family Medicine

## 2010-04-11 ENCOUNTER — Encounter: Payer: Self-pay | Admitting: Family Medicine

## 2010-04-11 DIAGNOSIS — E119 Type 2 diabetes mellitus without complications: Secondary | ICD-10-CM

## 2010-04-11 DIAGNOSIS — I10 Essential (primary) hypertension: Secondary | ICD-10-CM

## 2010-04-11 DIAGNOSIS — E785 Hyperlipidemia, unspecified: Secondary | ICD-10-CM

## 2010-04-11 LAB — POCT GLYCOSYLATED HEMOGLOBIN (HGB A1C): Hemoglobin A1C: 6.7

## 2010-04-11 MED ORDER — METFORMIN HCL 1000 MG PO TABS: 1000.0000 mg | ORAL_TABLET | Freq: Two times a day (BID) | ORAL | Status: DC

## 2010-04-11 MED ORDER — LOSARTAN POTASSIUM 50 MG PO TABS: 50.0000 mg | ORAL_TABLET | Freq: Every day | ORAL | Status: DC

## 2010-04-11 MED ORDER — PRAVASTATIN SODIUM 40 MG PO TABS: 40.0000 mg | ORAL_TABLET | Freq: Every day | ORAL | Status: DC

## 2010-04-11 MED ORDER — NABUMETONE 500 MG PO TABS: 500.0000 mg | ORAL_TABLET | Freq: Two times a day (BID) | ORAL | Status: DC | PRN

## 2010-04-11 MED ORDER — ZOSTER VACCINE LIVE 19400 UNT/0.65ML ~~LOC~~ SOLR: 0.6500 mL | Freq: Once | SUBCUTANEOUS | Status: AC

## 2010-04-11 MED ORDER — GLIPIZIDE 5 MG PO TABS: ORAL_TABLET | ORAL | Status: DC

## 2010-04-11 NOTE — Patient Instructions (Signed)
Your blood pressure and diabetes are doing well Continue to work on your weight - try to lose about 1-2 lb a week You can try the fish oil tablet 1 each evening before bed  Take the omeprazole for heart burn only when you need it Take the namebutone for leg pain only when you need it

## 2010-04-11 NOTE — Assessment & Plan Note (Signed)
Well controlled without symptoms Will need to check about eye exam next visit

## 2010-04-11 NOTE — Assessment & Plan Note (Signed)
At goal.  Will recheck in August.  Agreed trying fishoil a good idea

## 2010-04-11 NOTE — Assessment & Plan Note (Signed)
On recheck after resting was at goal.  Continue current regimen.  Discussed weight loss

## 2010-04-11 NOTE — Progress Notes (Signed)
  Subjective:    Patient ID: Arthur [name], male    DOB: January 30, 1940, 70 y.o.   MRN: [id number]  HPI HYPERTENSION Disease Monitoring Blood pressure range-not checking Chest pain- no      Dyspnea- no Medications Compliance- knows all meds and brings in Lightheadedness- no   Edema- no   DIABETES Disease Monitoring Blood Sugar ranges-less than 150 Polyuria- no New Visual problems- no Medications Compliance- every day Hypoglycemic symptoms- no   HYPERLIPIDEMIA Disease Monitoring See symptoms for Hypertension Medications Compliance- pravastatin daily is interested in taking fish oil like his wife RUQ pain- no  Muscle aches- no  ROS See HPI above   PMH Smoking Status noted  Updated History section    Review of Systems      Objective:   Physical Exam    healthy appearing    Assessment & Plan:

## 2010-05-15 ENCOUNTER — Telehealth: Payer: Self-pay | Admitting: Family Medicine

## 2010-05-15 NOTE — Telephone Encounter (Signed)
I filled out an put in to be faxed I thnk yesterday.  He should wait a week and contact us again if still a problem

## 2010-05-15 NOTE — Telephone Encounter (Signed)
Arthur [name] is calling about the forms the diabetic supply company sent for his glucometer and blood pressure machine.  They were suppose to have been faxed on April 8th, but the company has not received back as of yet.  Please inform if every received and if not will have pt contact company to resend documents.

## 2010-05-15 NOTE — Telephone Encounter (Signed)
To admin

## 2010-06-04 ENCOUNTER — Emergency Department (HOSPITAL_COMMUNITY): Payer: Medicare Other

## 2010-06-04 ENCOUNTER — Emergency Department (HOSPITAL_COMMUNITY)
Admission: EM | Admit: 2010-06-04 | Discharge: 2010-06-05 | Disposition: A | Discharge status: Home / Self Care | Payer: Medicare Other | Attending: Emergency Medicine | Admitting: Emergency Medicine

## 2010-06-04 DIAGNOSIS — E119 Type 2 diabetes mellitus without complications: Secondary | ICD-10-CM | POA: Insufficient documentation

## 2010-06-04 DIAGNOSIS — I10 Essential (primary) hypertension: Secondary | ICD-10-CM | POA: Insufficient documentation

## 2010-06-04 DIAGNOSIS — M25476 Effusion, unspecified foot: Secondary | ICD-10-CM | POA: Insufficient documentation

## 2010-06-04 DIAGNOSIS — S82409A Unspecified fracture of shaft of unspecified fibula, initial encounter for closed fracture: Secondary | ICD-10-CM | POA: Insufficient documentation

## 2010-06-04 DIAGNOSIS — E785 Hyperlipidemia, unspecified: Secondary | ICD-10-CM | POA: Insufficient documentation

## 2010-06-04 DIAGNOSIS — IMO0002 Reserved for concepts with insufficient information to code with codable children: Secondary | ICD-10-CM | POA: Insufficient documentation

## 2010-06-04 DIAGNOSIS — M25579 Pain in unspecified ankle and joints of unspecified foot: Secondary | ICD-10-CM | POA: Insufficient documentation

## 2010-06-04 DIAGNOSIS — M25473 Effusion, unspecified ankle: Secondary | ICD-10-CM | POA: Insufficient documentation
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
## 2010-06-15 NOTE — Op Note (Signed)
NAMEELVY, BUDNY                       ACCOUNT NO.:  [id number]   MEDICAL RECORD NO.:  [id number]                   PATIENT TYPE:  OUT   LOCATION:  [facility]                                 FACILITY:  [facility]   PHYSICIAN:  Robert A. Noemi Chapel, M.D.              DATE OF BIRTH:  1941/01/27   DATE OF PROCEDURE:  05/02/2003  DATE OF DISCHARGE:                                 OPERATIVE REPORT   PREOPERATIVE DIAGNOSIS:  Right knee medial meniscal tear with chondromalacia  and loose bodies.   POSTOPERATIVE DIAGNOSIS:  Right knee medial meniscal tear with  chondromalacia and loose bodies.   OPERATION PERFORMED:  1. Right knee examination under anesthesia followed by arthroscopic partial     medial meniscectomy.  2. Right knee chondroplasty with loose body excisions and partial     synovectomy.   SURGEON:  Audree Camel. Noemi Chapel, M.D.   ASSISTANT:  Matthew Saras, P.A.   ANESTHESIA:  General.   OPERATIVE TIME:  30 minutes.   COMPLICATIONS:  None.   INDICATIONS FOR PROCEDURE:  Mr. Wimpey is a 71 year old gentleman who has had  significant right knee pain for the past six months.  The pain is increasing  in nature with exam and MRI documenting loose bodies.  With medial meniscal  tear, chondromalacia and synovitis.  He has failed conservative care and is  now to undergo arthroscopy.   DESCRIPTION OF PROCEDURE:  Mr. Leedom was brought to the operating room on  May 02, 2003, placed on the operating table in supine position.  After an  adequate level of general anesthesia was obtained, his right knee was  examined.  Range of motion 0 to 125 degrees, 1 to 2+ crepitation.  Knee  stable to ligamentous exam with normal patellar tracking.  The right leg was  prepped using sterile DuraPrep and draped using sterile technique.  Originally, through an anterior and lateral portal the arthroscope with a  pump attached was placed and through and through an anterior medial portal,  an arthroscopic  probe was placed.  On initial inspection of the medial  compartment, he was found to have 25 to 30% grade 4 and the rest grade 3  chondromalacia which was debrided.  The medial meniscus showed tearing of  the posterior medial horn of which 30 to 40% was resected back to a stable  rim.  The ACL and the PCL were intact.  There were loose bodies floating in  the anterior compartment which were removed.  The lateral compartment had  25% grade 3 chondromalacia and the rest grade 1 and 2 changes.  The lateral  meniscus was intact.  Patellofemoral joint showed 75% grade 3 chondromalacia  on the patellofemoral groove and this was debrided.  The patella tracked  normally.  Moderate synovitis in the medial and lateral gutters was  debrided.  Otherwise they were free of pathology.  After this was done, it  was felt  that all pathology had satisfactorily addressed.  Instruments were  removed.  Portals were closed with 3-0 nylon suture and injected with 0.25%  Marcaine with epinephrine and 4 mg of morphine.  Sterile dressings applied  and the patient awakened and taken to the recovery room in stable condition.   FOLLOW UP:  Mr. Kmetz will be followed as an outpatient on Vicodin and  Naprosyn.  See him back in the office in a week for suture removal and  follow-up.                                               Robert A. Noemi Chapel, M.D.    RAW/MEDQ  D:  05/02/2003  T:  05/03/2003  Job:  [id number]

## 2010-07-26 ENCOUNTER — Ambulatory Visit: Payer: Medicare Other | Attending: Family Medicine | Admitting: Physical Therapy

## 2010-07-26 DIAGNOSIS — R262 Difficulty in walking, not elsewhere classified: Secondary | ICD-10-CM | POA: Insufficient documentation

## 2010-07-26 DIAGNOSIS — IMO0001 Reserved for inherently not codable concepts without codable children: Secondary | ICD-10-CM | POA: Insufficient documentation

## 2010-07-26 DIAGNOSIS — M25579 Pain in unspecified ankle and joints of unspecified foot: Secondary | ICD-10-CM | POA: Insufficient documentation

## 2010-07-30 ENCOUNTER — Ambulatory Visit: Payer: Medicare Other | Attending: Family Medicine | Admitting: Physical Therapy

## 2010-07-30 DIAGNOSIS — R262 Difficulty in walking, not elsewhere classified: Secondary | ICD-10-CM | POA: Insufficient documentation

## 2010-07-30 DIAGNOSIS — M25579 Pain in unspecified ankle and joints of unspecified foot: Secondary | ICD-10-CM | POA: Insufficient documentation

## 2010-07-30 DIAGNOSIS — IMO0001 Reserved for inherently not codable concepts without codable children: Secondary | ICD-10-CM | POA: Insufficient documentation

## 2010-08-03 ENCOUNTER — Ambulatory Visit: Payer: Medicare Other | Admitting: Physical Therapy

## 2010-08-07 ENCOUNTER — Ambulatory Visit: Payer: Medicare Other | Admitting: Physical Therapy

## 2010-08-10 ENCOUNTER — Ambulatory Visit: Payer: Medicare Other | Admitting: Physical Therapy

## 2010-08-14 ENCOUNTER — Ambulatory Visit: Payer: Medicare Other | Admitting: Physical Therapy

## 2010-08-16 ENCOUNTER — Encounter: Payer: Medicare Other | Admitting: Physical Therapy

## 2010-08-20 ENCOUNTER — Ambulatory Visit: Payer: Medicare Other | Admitting: Physical Therapy

## 2010-08-24 ENCOUNTER — Ambulatory Visit (HOSPITAL_COMMUNITY): Payer: No Typology Code available for payment source

## 2010-08-27 ENCOUNTER — Ambulatory Visit (HOSPITAL_COMMUNITY)
Admission: RE | Admit: 2010-08-27 | Discharge: 2010-08-27 | Disposition: A | Discharge status: Home / Self Care | Payer: Medicare Other | Source: Ambulatory Visit | Attending: Family Medicine | Admitting: Family Medicine

## 2010-08-27 DIAGNOSIS — M79609 Pain in unspecified limb: Secondary | ICD-10-CM

## 2010-08-27 DIAGNOSIS — M7989 Other specified soft tissue disorders: Secondary | ICD-10-CM | POA: Insufficient documentation

## 2010-08-29 ENCOUNTER — Ambulatory Visit (INDEPENDENT_AMBULATORY_CARE_PROVIDER_SITE_OTHER): Payer: Medicare Other | Admitting: Family Medicine

## 2010-08-29 ENCOUNTER — Encounter: Payer: Self-pay | Admitting: Family Medicine

## 2010-08-29 VITALS — BP 184/91 | HR 84 | Temp 98.4°F | Ht 71.0 in | Wt 208.7 lb

## 2010-08-29 DIAGNOSIS — E119 Type 2 diabetes mellitus without complications: Secondary | ICD-10-CM

## 2010-08-29 DIAGNOSIS — E785 Hyperlipidemia, unspecified: Secondary | ICD-10-CM

## 2010-08-29 DIAGNOSIS — I10 Essential (primary) hypertension: Secondary | ICD-10-CM

## 2010-08-29 MED ORDER — HYDROCHLOROTHIAZIDE 25 MG PO TABS: 25.0000 mg | ORAL_TABLET | Freq: Every day | ORAL | Status: DC

## 2010-08-29 NOTE — Assessment & Plan Note (Signed)
Lab Results  Component Value Date   HGBA1C 6.4 08/29/2010   HGBA1C 6.8 12/27/2009   CHOL 172 09/25/2009   HDL 38.80* 09/25/2009   TRIG 200.0* 09/25/2009    Last eye exam and foot exam: No results found for this basename: HMDIABEYEEXA, HMDIABFOOTEX    Assessment: Diabetes control: controlled Progress toward goals: at goal  Plan: Diabetes treatment: continue current medications Refer to: none Instruction/counseling given: reminded to get eye exam
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
## 2010-08-29 NOTE — Assessment & Plan Note (Signed)
No results found for this basename: NA, K, CL, CO2, BUN, CREATININE    BP Readings from Last 3 Encounters:  08/29/10 184/91  04/11/10 138/78  02/26/10 160/75    Assessment: Hypertension control:  mildly elevated  Progress toward goals:  deteriorated Barriers to meeting goals:  None  Plan: Hypertension treatment:  will add HCTZ and ask him not to take the Lasix and K that he was prescribed by Ortho for edema

## 2010-08-29 NOTE — Assessment & Plan Note (Signed)
Unsure of control will check FLP.   He needs to work on his weight

## 2010-08-29 NOTE — Patient Instructions (Signed)
Your blood pressure too high It should be < 140/90 all the time.   Take your blood pressure and write down and bring in next visit  Come in for a fasting cholesterol - No food except water for 8 hours before.    Do Not take the furosemide  Or Klor yet instead take the new medicine Hydrochlorthiazide  Work on your weight.    See your eye doctor for diabetes eye check

## 2010-09-12 ENCOUNTER — Other Ambulatory Visit: Payer: Self-pay | Admitting: Cardiology

## 2010-09-12 ENCOUNTER — Other Ambulatory Visit: Payer: Self-pay | Admitting: Family Medicine

## 2010-09-12 NOTE — Telephone Encounter (Signed)
Refill request

## 2010-12-26 ENCOUNTER — Encounter: Payer: Self-pay | Admitting: Family Medicine

## 2010-12-26 ENCOUNTER — Ambulatory Visit (INDEPENDENT_AMBULATORY_CARE_PROVIDER_SITE_OTHER): Payer: Medicare Other | Admitting: Family Medicine

## 2010-12-26 VITALS — BP 151/80 | HR 71 | Temp 98.1°F | Ht 71.0 in | Wt 198.0 lb

## 2010-12-26 DIAGNOSIS — E119 Type 2 diabetes mellitus without complications: Secondary | ICD-10-CM

## 2010-12-26 DIAGNOSIS — Z23 Encounter for immunization: Secondary | ICD-10-CM

## 2010-12-26 DIAGNOSIS — I10 Essential (primary) hypertension: Secondary | ICD-10-CM

## 2010-12-26 DIAGNOSIS — E785 Hyperlipidemia, unspecified: Secondary | ICD-10-CM

## 2010-12-26 LAB — COMPREHENSIVE METABOLIC PANEL
Albumin: 4.4 g/dL (ref 3.5–5.2)
CO2: 26 mEq/L (ref 19–32)
Calcium: 10.1 mg/dL (ref 8.4–10.5)
Chloride: 99 mEq/L (ref 96–112)
Glucose, Bld: 84 mg/dL (ref 70–99)
Sodium: 138 mEq/L (ref 135–145)
Total Bilirubin: 0.7 mg/dL (ref 0.3–1.2)
Total Protein: 7.7 g/dL (ref 6.0–8.3)

## 2010-12-26 LAB — LIPID PANEL
Cholesterol: 181 mg/dL (ref 0–200)
Triglycerides: 177 mg/dL — ABNORMAL HIGH (ref <150)
VLDL: 35 mg/dL (ref 0–40)

## 2010-12-26 NOTE — Assessment & Plan Note (Signed)
Well controlled continue current medications  

## 2010-12-26 NOTE — Assessment & Plan Note (Signed)
Check labs.  Tolerating statin well

## 2010-12-26 NOTE — Assessment & Plan Note (Signed)
Not controlled in office and is on multiple medications which he seems to be taking.  Will refer for ambulatory BP monitoring

## 2010-12-26 NOTE — Progress Notes (Signed)
  Subjective:    Patient ID: Arthur [name], male    DOB: Jan 30, 1940, 70 y.o.   MRN: [id number]  HPI HYPERTENSION Disease Monitoring: Blood pressure range-Thinks is usually well controlled.  Feels his readings here are due to his anxiety Chest pain, palpitations- no      Dyspnea- no  Medications: Compliance- has all his medication bottles, knows his dosages Lightheadedness,Syncope- no   Edema- no   DIABETES Disease Monitoring: Blood Sugar ranges-90-140s  Polyuria/phagia/dipsia- no      Visual problems- no  Medications: Compliance- has all his medications  Hypoglycemic symptoms- no    HYPERLIPIDEMIA Disease Monitoring: See symptoms for Hypertension  Medications: Compliance- daily pravastatin Right upper quadrant pain- no  Muscle aches- no   ROS See HPI above   PMH Smoking Status noted       Review of Systems     Objective:   Physical Exam  Heart - Regular rate and rhythm.  No murmurs, gallops or rubs.    Lungs:  Normal respiratory effort, chest expands symmetrically. Lungs are clear to auscultation, no crackles or wheezes. Extremities:  No cyanosis, edema, or deformity noted with good range of motion of all major joints.        Assessment & Plan:

## 2010-12-26 NOTE — Patient Instructions (Addendum)
I will call you if your tests are not good.  Otherwise I will send you a letter.  If you do not hear from me with in 2 weeks please call our office.     Take the nabumetone only a needed not regularly  Dr Valentina Lucks will call you about the home blood pressure monitor   Your goal is less than 140/90  130/80 is good

## 2010-12-27 ENCOUNTER — Encounter: Payer: Self-pay | Admitting: Family Medicine

## 2011-01-04 ENCOUNTER — Telehealth: Payer: Self-pay | Admitting: Pharmacist

## 2011-01-04 NOTE — Progress Notes (Signed)
Patient scheduled for BP Monitor set up on 12/20 at 9:00 AM AND return on 12/21

## 2011-01-04 NOTE — Telephone Encounter (Signed)
Message copied by Leavy Cella on Fri Jan 04, 2011  9:18 AM ------      Message from: CHAMBLISS, LEE L      Created: Wed Dec 26, 2010 12:37 PM       If you think he is a good candidate can you call him to set up Amb BP monitor?      thanks

## 2011-01-04 NOTE — Telephone Encounter (Signed)
Set up BP monitor visit on 12/20 and return on 12/21

## 2011-01-07 ENCOUNTER — Ambulatory Visit (INDEPENDENT_AMBULATORY_CARE_PROVIDER_SITE_OTHER): Payer: Medicare Other | Admitting: Family Medicine

## 2011-01-07 DIAGNOSIS — J069 Acute upper respiratory infection, unspecified: Secondary | ICD-10-CM | POA: Insufficient documentation

## 2011-01-07 NOTE — Patient Instructions (Signed)
If you are not better in one week or if you get high fever again or cough up sputum or have bad chest pain then come back  Use tylenol for muscle aches

## 2011-01-07 NOTE — Progress Notes (Signed)
  Subjective:    Patient ID: Arthur [name], male    DOB: 07-Dec-1940, 70 y.o.   MRN: [id number]  HPI  URI Sick for 5 days with aches, chills, fever, dry cough and runny nose.  Diagnosed as flu at [facility] and given tamiflu which he finished.  Feels some better.  No chest pain or dysuria or skin lesions or face pain  Review of Symptoms - see HPI  PMH - Smoking status noted.     Review of Systems     Objective:   Physical Exam  Alert mildy fatigued Lungs:  Normal respiratory effort, chest expands symmetrically. Lungs are clear to auscultation, no crackles or wheezes. Heart - Regular rate and rhythm.  No murmurs, gallops or rubs.    Abdomen: soft and non-tender without masses, organomegaly or hernias noted.  No guarding or rebound Extremities:  No cyanosis, edema, or deformity noted with good range of motion of all major joints.   Skin:  Intact without suspicious lesions or rashes       Assessment & Plan:

## 2011-01-07 NOTE — Assessment & Plan Note (Signed)
Likely flu like illness without signs of focal bacterial infection.  Seems to be gradually improving.  [place] for any complications

## 2011-01-16 ENCOUNTER — Ambulatory Visit: Payer: No Typology Code available for payment source | Admitting: Family Medicine

## 2011-02-04 ENCOUNTER — Encounter: Payer: Self-pay | Admitting: Pharmacist

## 2011-02-04 ENCOUNTER — Ambulatory Visit (INDEPENDENT_AMBULATORY_CARE_PROVIDER_SITE_OTHER): Payer: Medicare Other | Admitting: Pharmacist

## 2011-02-04 VITALS — BP 140/72 | Ht 68.0 in | Wt 193.7 lb

## 2011-02-04 DIAGNOSIS — I10 Essential (primary) hypertension: Secondary | ICD-10-CM
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
## 2011-02-05 ENCOUNTER — Ambulatory Visit: Payer: Medicare Other | Admitting: Pharmacist

## 2011-02-05 MED ORDER — ATENOLOL 50 MG PO TABS: 50.0000 mg | ORAL_TABLET | Freq: Two times a day (BID) | ORAL | Status: DC

## 2011-02-05 MED ORDER — PRAVASTATIN SODIUM 40 MG PO TABS: 40.0000 mg | ORAL_TABLET | Freq: Every day | ORAL | Status: DC

## 2011-02-05 MED ORDER — AMLODIPINE BESYLATE 10 MG PO TABS: 10.0000 mg | ORAL_TABLET | Freq: Every day | ORAL | Status: DC

## 2011-02-05 MED ORDER — GLIPIZIDE 5 MG PO TABS: ORAL_TABLET | ORAL | Status: DC

## 2011-02-05 MED ORDER — METFORMIN HCL 1000 MG PO TABS: 1000.0000 mg | ORAL_TABLET | Freq: Two times a day (BID) | ORAL | Status: DC

## 2011-02-05 MED ORDER — LOSARTAN POTASSIUM 100 MG PO TABS: 100.0000 mg | ORAL_TABLET | Freq: Every day | ORAL | Status: DC

## 2011-02-05 MED ORDER — OMEPRAZOLE 20 MG PO CPDR: 20.0000 mg | DELAYED_RELEASE_CAPSULE | Freq: Every day | ORAL | Status: DC | PRN

## 2011-02-05 MED ORDER — HYDROCHLOROTHIAZIDE 25 MG PO TABS: 25.0000 mg | ORAL_TABLET | Freq: Every day | ORAL | Status: DC

## 2011-02-05 NOTE — Patient Instructions (Addendum)
1. Move your Norvasc (Amlodipine) tablet in the evening (bedtime) starting tomorrow.  2. We are increasing your Cozaar (losartan) to 100mg  daily. Start this new dose tomorrow. You can take two of your 50mg  tablets until you run out, then switch to the new 100mg  tablets. The new prescription was sent to your pharmacy. 3. New prescriptions for 2013 for your other medications were sent to your pharmacy. Take them as they were prescribed by Dr. Erin Hearing. 4. Call the family medicine clinic if you have any problems.   Come back to the clinic to see Dr. Erin Hearing in February to recheck your blood pressure.

## 2011-02-07 ENCOUNTER — Ambulatory Visit: Payer: No Typology Code available for payment source | Admitting: Pharmacist

## 2011-02-07 NOTE — Assessment & Plan Note (Signed)
[name] is a 28yoM with resistant hypertension not currently controlled on his 4 medications despite good compliance. His overall 24-hour ambulatory blood pressure was 152/64mmHg without the normal dipping pattern. We have increased his losartan dose to 100mg  daily and have moved his amlodipine tablet to bedtime administration. Recommended that [name] follows up with Dr. Erin Hearing in February for his annual physical and to follow-up his blood pressure in the clinic. Would also recommend following up with a new fasting lipid panel and adjust statin therapy as appropriate. New prescriptions for 2013 were sent electronically to his pharmacy.  Patient seen with Woodroe Chen, PharmD Resident, Laurey Morale, PharmD Resident, James Ivanoff , Pharmacy Student.

## 2011-02-08 NOTE — Progress Notes (Signed)
  Subjective:    Patient ID: Arthur [name], male    DOB: Aug 20, 1940, 71 y.o.   MRN: [id number]  HPI Reviewed and agree with Dr. Graylin Shiver management     Review of Systems     Objective:   Physical Exam        Assessment & Plan:

## 2011-03-13 ENCOUNTER — Ambulatory Visit (INDEPENDENT_AMBULATORY_CARE_PROVIDER_SITE_OTHER): Payer: Medicare Other | Admitting: Family Medicine

## 2011-03-13 ENCOUNTER — Encounter: Payer: Self-pay | Admitting: Family Medicine

## 2011-03-13 VITALS — BP 128/74 | HR 84 | Temp 98.6°F | Ht 71.0 in | Wt 197.6 lb

## 2011-03-13 DIAGNOSIS — E119 Type 2 diabetes mellitus without complications: Secondary | ICD-10-CM

## 2011-03-13 DIAGNOSIS — E785 Hyperlipidemia, unspecified: Secondary | ICD-10-CM

## 2011-03-13 DIAGNOSIS — I10 Essential (primary) hypertension: Secondary | ICD-10-CM

## 2011-03-13 DIAGNOSIS — J069 Acute upper respiratory infection, unspecified: Secondary | ICD-10-CM

## 2011-03-13 MED ORDER — BENZONATATE 200 MG PO CAPS: 200.0000 mg | ORAL_CAPSULE | Freq: Two times a day (BID) | ORAL | Status: DC | PRN

## 2011-03-13 NOTE — Assessment & Plan Note (Signed)
Good control. Continue current medications.

## 2011-03-13 NOTE — Progress Notes (Signed)
  Subjective:    Patient ID: Arthur [name], male    DOB: [birth year], 71 y.o.   MRN: [id number]  HPI  HYPERTENSION Disease Monitoring: Blood pressure range-usually below 140/90  Chest pain, palpitations- no      Dyspnea- no  Medications: Compliance- brings all in  Lightheadedness,Syncope- no   Edema- no   DIABETES Disease Monitoring: Blood Sugar ranges-less than 130 Polyuria/phagia/dipsia- no      Visual problems- no  Medications: Compliance- good Hypoglycemic symptoms- no    HYPERLIPIDEMIA Disease Monitoring: See symptoms for Hypertension  Medications: Compliance- daily pravastatin Right upper quadrant pain- no  Muscle aches- no Recognizes he has gained weight and needs to work on his diet  Cough For last few days assoc with URI symptoms.  No fever or sputum but mild left sided back pain when he coughs or lifts.  No shortness of breath or bleeding  ROS See HPI above   PMH Smoking Status noted      Review of Systems     Objective:   Physical Exam Heart - Regular rate and rhythm.  No murmurs, gallops or rubs.    Lungs:  Normal respiratory effort, chest expands symmetrically. Lungs are clear to auscultation, no crackles or wheezes. Extremities:  No cyanosis, edema, or deformity noted with good range of motion of all major joints.   Nose - clear discharge      Assessment & Plan:

## 2011-03-13 NOTE — Assessment & Plan Note (Signed)
Not at goal but close.  Will have him go for Wellness visit and to work on diet and weight control

## 2011-03-13 NOTE — Assessment & Plan Note (Addendum)
No signs of pneumonia or lower respiratory tract involvement.  Treat symptomatically

## 2011-03-13 NOTE — Assessment & Plan Note (Signed)
Good control  Stop glipizide and rechck A1c in 3 months

## 2011-03-13 NOTE — Patient Instructions (Signed)
You should get a colonoscopy to prevent colon cancer.  Call the number to see how much it might be  Take the medicine for cough twice daily If the cough is not gone in 10 days or you get fever or shortness of breath the come back  Stop the glipizide medicine  Your diabetes and blood pressure are doing very well  You should see our health coach for a wellness visit

## 2011-03-19 ENCOUNTER — Encounter: Payer: Self-pay | Admitting: Family Medicine

## 2011-03-19 ENCOUNTER — Ambulatory Visit (INDEPENDENT_AMBULATORY_CARE_PROVIDER_SITE_OTHER): Payer: Medicare Other | Admitting: Family Medicine

## 2011-03-19 VITALS — BP 150/82 | HR 83 | Temp 98.6°F | Ht 71.0 in | Wt 192.0 lb

## 2011-03-19 DIAGNOSIS — R05 Cough: Secondary | ICD-10-CM

## 2011-03-19 DIAGNOSIS — R059 Cough, unspecified: Secondary | ICD-10-CM | POA: Insufficient documentation

## 2011-03-19 MED ORDER — GUAIFENESIN ER 600 MG PO TB12: 1200.0000 mg | ORAL_TABLET | Freq: Two times a day (BID) | ORAL | Status: DC

## 2011-03-19 MED ORDER — CETIRIZINE HCL 10 MG PO TABS: 10.0000 mg | ORAL_TABLET | Freq: Every day | ORAL | Status: DC

## 2011-03-19 NOTE — Patient Instructions (Signed)
It was a pleasure to see you today.  I believe your cough is likely related to your taking the benzonatate for cough; please stop this medication.  I believe your cough may have an allergic component; I prescribed you Zyrtec 10mg  tablet one time daily.  Also, guiafenesin 600mg  take 1 tablet twice daily to loosen any mucus in your airway.   I am ordering a chest x-ray to be done if you are continuing to cough without relief from the new medication regimen.   I ask that you see your primary doctor, Dr. Erin Hearing, in the coming 2 weeks.   If your nausea and vomiting does not get better after stopping the benzonatate, please call me back to discuss future plans.

## 2011-03-20 NOTE — Progress Notes (Signed)
  Subjective:    Patient ID: Arthur [name], male    DOB: Jul 28, 1940, 71 y.o.   MRN: [id number]  HPI Patient here for acute visit, complaint of nausea and vomiting that began the day after starting Tessalon perles last week for cough.  Continues to have cough, with white/yellow sputum, no blood.  No fevers.  Feels "weak all over", generalized, since onset of symptoms.  Has not eaten in 2 days, taking water and Gatorade sports drinks (orange). Denies fevers or chills; no diarrhea, no sick contacts.  No abdominal pain.  Patient is strict vegetarian, eating no meat, oils/butter/ghee. Has had some clear watery nasal secretion, no sore throat or ear pain.   Has continued to take the Tessalon perles since onset of illness.    Review of Systems Reports that he gets the same coughing episodes every 2 months, last for a few days and then spontaneously resolve.  No fevers, no hemoptysis.  No reported weight loss.  Otherwise feels well.  No abdominal surgical history.     Objective:   Physical Exam  Well appearing, no apparent distress. HEENT Neck supple, no cervical adenopathy.  Conjunctivae clear.  TMs clear bilaterally. No frontal or maxillary sinus tenderness. NAsal mucosa with watery discharge, no purulence. Clear oropharynx  COR S1S2, no extra sounds PULM Clear bilaterally, no rales or wheezes ABD Soft, nontender, nondistended. No masses.       Assessment & Plan:

## 2011-03-20 NOTE — Assessment & Plan Note (Signed)
Cough, episodic, I am most suspicious of allergic rhinitis with postnasal drip as cause.  He looks very well today.  No smoking history, no other symptoms or findings to suggest infectious or neoplastic etiology.  I have discussed with him the possibility of doing a CXR if not resolved with cessation of the Tessalon, use of antihistamine.  Further workup per primary physician in follow up.  He agrees to this plan. Will get CXR if no better in the coming 2 days.

## 2011-04-16 ENCOUNTER — Other Ambulatory Visit: Payer: Self-pay | Admitting: Family Medicine

## 2011-04-24 ENCOUNTER — Ambulatory Visit: Payer: Medicare Other | Admitting: Family Medicine

## 2011-05-08 ENCOUNTER — Ambulatory Visit (INDEPENDENT_AMBULATORY_CARE_PROVIDER_SITE_OTHER): Payer: Medicare Other | Admitting: Family Medicine

## 2011-05-08 ENCOUNTER — Encounter: Payer: Self-pay | Admitting: Family Medicine

## 2011-05-08 VITALS — BP 157/75 | HR 78 | Temp 98.3°F | Ht 71.0 in | Wt 195.0 lb

## 2011-05-08 DIAGNOSIS — E119 Type 2 diabetes mellitus without complications: Secondary | ICD-10-CM

## 2011-05-08 DIAGNOSIS — I10 Essential (primary) hypertension: Secondary | ICD-10-CM

## 2011-05-08 MED ORDER — LOSARTAN POTASSIUM 100 MG PO TABS: 100.0000 mg | ORAL_TABLET | Freq: Every day | ORAL | Status: DC

## 2011-05-08 MED ORDER — PRAVASTATIN SODIUM 40 MG PO TABS: 40.0000 mg | ORAL_TABLET | Freq: Every day | ORAL | Status: DC

## 2011-05-08 MED ORDER — AMLODIPINE BESYLATE 10 MG PO TABS: 10.0000 mg | ORAL_TABLET | Freq: Every day | ORAL | Status: DC

## 2011-05-08 MED ORDER — METFORMIN HCL 1000 MG PO TABS: 1000.0000 mg | ORAL_TABLET | Freq: Two times a day (BID) | ORAL | Status: DC

## 2011-05-08 MED ORDER — HYDROCHLOROTHIAZIDE 25 MG PO TABS: 25.0000 mg | ORAL_TABLET | Freq: Every day | ORAL | Status: DC

## 2011-05-08 NOTE — Assessment & Plan Note (Signed)
Not controlled today due to not taking HCTZ.  Went over his medications in detail

## 2011-05-08 NOTE — Progress Notes (Signed)
  Subjective:    Patient ID: Arthur [name], male    DOB: 07-18-1940, 71 y.o.   MRN: [id number]  HPI  He is concerned he is confused about which medications to take.  He brings in a large bag of current and old medications  HYPERTENSION Disease Monitoring Home BP Monitoring not doing Chest pain- no     Dyspnea-  no  Medications Compliance: Medications - did not take HCTZ this am since was unsure if current. Lightheadedness-  Yes Edema-  no    DIABETES Disease Monitoring: Blood Sugar ranges-not checkign Polyuria/phagia/dipsia- no      Visual problems- no  Medications: Compliance- had old bottle for glipizide that is not taking Hypoglycemic symptoms- no   PMH Lab Review   Potassium  Date Value Range Status  12/26/2010 4.3  3.5-5.3 (mEq/L) Final     Sodium  Date Value Range Status  12/26/2010 138  135-145 (mEq/L) Final     Review of Symptoms - see HPI      Review of Systems     Objective:   Physical Exam  no apparent distress       Assessment & Plan:

## 2011-05-08 NOTE — Patient Instructions (Signed)
Check your blood pressure if it is regularly > 140/90 then call us

## 2011-05-08 NOTE — Assessment & Plan Note (Signed)
Well controlled on metformin does not need sulfnylurea

## 2011-06-26 ENCOUNTER — Ambulatory Visit (INDEPENDENT_AMBULATORY_CARE_PROVIDER_SITE_OTHER): Payer: Medicare Other | Admitting: Family Medicine

## 2011-06-26 ENCOUNTER — Encounter: Payer: Self-pay | Admitting: Family Medicine

## 2011-06-26 VITALS — BP 153/84 | HR 74 | Temp 97.9°F | Ht 71.0 in | Wt 189.3 lb

## 2011-06-26 DIAGNOSIS — E119 Type 2 diabetes mellitus without complications: Secondary | ICD-10-CM

## 2011-06-26 DIAGNOSIS — I10 Essential (primary) hypertension: Secondary | ICD-10-CM

## 2011-06-26 DIAGNOSIS — E669 Obesity, unspecified: Secondary | ICD-10-CM

## 2011-06-26 DIAGNOSIS — E785 Hyperlipidemia, unspecified: Secondary | ICD-10-CM

## 2011-06-26 LAB — HEPATIC FUNCTION PANEL
Albumin: 4.3 g/dL (ref 3.5–5.2)
Alkaline Phosphatase: 59 U/L (ref 39–117)
Total Bilirubin: 0.9 mg/dL (ref 0.3–1.2)
Total Protein: 7.4 g/dL (ref 6.0–8.3)

## 2011-06-26 LAB — LIPID PANEL
HDL: 37 mg/dL — ABNORMAL LOW (ref >39)
LDL Cholesterol: 101 mg/dL — ABNORMAL HIGH (ref 0–99)
Total CHOL/HDL Ratio: 4.6 Ratio
VLDL: 34 mg/dL (ref 0–40)

## 2011-06-26 NOTE — Assessment & Plan Note (Signed)
Improved - lost 6 lb since last visit

## 2011-06-26 NOTE — Assessment & Plan Note (Signed)
Good control continue medications

## 2011-06-26 NOTE — Assessment & Plan Note (Signed)
Stable check labs  

## 2011-06-26 NOTE — Patient Instructions (Addendum)
See our health coach - Lamont Dowdy We will check your blood pressure again then  Take all your medication before   I will call you if your tests are not good.  Otherwise I will send you a letter.  If you do not hear from me with in 2 weeks please call our office.     You can stop checking your blood sugars  See me in September

## 2011-06-26 NOTE — Assessment & Plan Note (Signed)
Not at goal today but did not take medications this am.  Will recheck when he sees our health coach

## 2011-06-26 NOTE — Progress Notes (Signed)
  Subjective:    Patient ID: Arthur [name], male    DOB: 22-Jun-1940, 71 y.o.   MRN: [id number]  HPI  HYPERTENSION Disease Monitoring: Blood pressure range-130-150/80s Chest pain, palpitations- no      Dyspnea- no Medications: Compliance- usually did not take this am since fasting Lightheadedness,Syncope- no   Edema- no   DIABETES Disease Monitoring: Blood Sugar ranges-110s Polyuria/phagia/dipsia- no      Visual problems- no Medications: Compliance- daily Hypoglycemic symptoms- no    HYPERLIPIDEMIA Disease Monitoring: See symptoms for Hypertension Medications: Compliance- daily pravastatin Right upper quadrant pain- no  Muscle aches- no    ROS See HPI above   PMH Smoking Status noted      Review of Systems     Objective:   Physical Exam  Heart - Regular rate and rhythm.  No murmurs, gallops or rubs.    Lungs:  Normal respiratory effort, chest expands symmetrically. Lungs are clear to auscultation, no crackles or wheezes. Diabetic Foot Check -  Appearance - no lesions, ulcers or calluses Skin - no unusual pallor or redness Monofilament testing -  Right - Great toe, medial, central, lateral ball and posterior foot intact Left - Great toe, medial, central, lateral ball and posterior foot intact       Assessment & Plan:

## 2011-06-27 ENCOUNTER — Encounter: Payer: Self-pay | Admitting: Family Medicine

## 2011-10-02 ENCOUNTER — Other Ambulatory Visit: Payer: Self-pay | Admitting: *Deleted

## 2011-10-02 ENCOUNTER — Ambulatory Visit (INDEPENDENT_AMBULATORY_CARE_PROVIDER_SITE_OTHER): Payer: Medicare Other | Admitting: Family Medicine

## 2011-10-02 ENCOUNTER — Other Ambulatory Visit: Payer: Self-pay | Admitting: Family Medicine

## 2011-10-02 VITALS — BP 141/69 | HR 73 | Temp 98.5°F | Ht 71.0 in | Wt 192.2 lb

## 2011-10-02 DIAGNOSIS — E785 Hyperlipidemia, unspecified: Secondary | ICD-10-CM

## 2011-10-02 DIAGNOSIS — E119 Type 2 diabetes mellitus without complications: Secondary | ICD-10-CM

## 2011-10-02 DIAGNOSIS — I10 Essential (primary) hypertension: Secondary | ICD-10-CM

## 2011-10-02 LAB — POCT GLYCOSYLATED HEMOGLOBIN (HGB A1C): Hemoglobin A1C: 7.1

## 2011-10-02 MED ORDER — NABUMETONE 500 MG PO TABS: 500.0000 mg | ORAL_TABLET | Freq: Two times a day (BID) | ORAL | Status: DC | PRN

## 2011-10-02 MED ORDER — AMLODIPINE BESYLATE 10 MG PO TABS: 10.0000 mg | ORAL_TABLET | Freq: Every day | ORAL | Status: DC

## 2011-10-02 MED ORDER — OMEPRAZOLE 20 MG PO CPDR: 20.0000 mg | DELAYED_RELEASE_CAPSULE | Freq: Every day | ORAL | Status: DC | PRN

## 2011-10-02 MED ORDER — LOSARTAN POTASSIUM 100 MG PO TABS: 100.0000 mg | ORAL_TABLET | Freq: Every day | ORAL | Status: DC

## 2011-10-02 MED ORDER — HYDROCHLOROTHIAZIDE 25 MG PO TABS: 25.0000 mg | ORAL_TABLET | Freq: Every day | ORAL | Status: DC

## 2011-10-02 MED ORDER — METFORMIN HCL 1000 MG PO TABS: 1000.0000 mg | ORAL_TABLET | Freq: Two times a day (BID) | ORAL | Status: DC

## 2011-10-02 MED ORDER — PRAVASTATIN SODIUM 40 MG PO TABS: 40.0000 mg | ORAL_TABLET | Freq: Every day | ORAL | Status: DC

## 2011-10-02 NOTE — Assessment & Plan Note (Signed)
Improved control.  Check labs next visit

## 2011-10-02 NOTE — Progress Notes (Signed)
  Subjective:    Patient ID: Arthur [name], male    DOB: [birth year], 71 y.o.   MRN: [id number]  HPI  HYPERTENSION Disease Monitoring: Blood pressure range-not checking Chest pain, palpitations- no      Dyspnea- no Medications: Compliance- daily Lightheadedness,Syncope- no   Edema- no   DIABETES Disease Monitoring: Blood Sugar ranges-not checking Polyuria/phagia/dipsia- no      Visual problems- no Medications: Compliance- daily Hypoglycemic symptoms- no    HYPERLIPIDEMIA Disease Monitoring: See symptoms for Hypertension Medications: Compliance- daily pravastatin Right upper quadrant pain- no  Muscle aches- no    ROS See HPI above   PMH Smoking Status noted       Review of Systems     Objective:   Physical Exam Heart - Regular rate and rhythm.  No murmurs, gallops or rubs.    Lungs:  Normal respiratory effort, chest expands symmetrically. Lungs are clear to auscultation, no crackles or wheezes. Extremities:  No cyanosis, edema, or deformity noted with good range of motion of all major joints.          Assessment & Plan:

## 2011-10-02 NOTE — Assessment & Plan Note (Signed)
Reasonable control. 

## 2011-10-02 NOTE — Patient Instructions (Addendum)
Keep taking all medications as you are  Send in the stool bowel movement test for colon cancer  Ask your insurance how to get the Shingles Zostavax shot  Come back in December to check your blood again

## 2011-10-02 NOTE — Assessment & Plan Note (Signed)
Well controlled continue current medications  

## 2011-12-21 ENCOUNTER — Other Ambulatory Visit: Payer: Self-pay | Admitting: Family Medicine

## 2012-01-01 ENCOUNTER — Ambulatory Visit (INDEPENDENT_AMBULATORY_CARE_PROVIDER_SITE_OTHER): Payer: Medicare (Managed Care) | Admitting: Family Medicine

## 2012-01-01 ENCOUNTER — Encounter: Payer: Self-pay | Admitting: Family Medicine

## 2012-01-01 VITALS — BP 120/70 | HR 76 | Temp 98.9°F | Ht 71.0 in | Wt 192.0 lb

## 2012-01-01 DIAGNOSIS — E119 Type 2 diabetes mellitus without complications: Secondary | ICD-10-CM

## 2012-01-01 DIAGNOSIS — Z23 Encounter for immunization: Secondary | ICD-10-CM

## 2012-01-01 DIAGNOSIS — I10 Essential (primary) hypertension: Secondary | ICD-10-CM

## 2012-01-01 DIAGNOSIS — E785 Hyperlipidemia, unspecified: Secondary | ICD-10-CM

## 2012-01-01 LAB — BASIC METABOLIC PANEL
BUN: 20 mg/dL (ref 6–23)
Chloride: 97 mEq/L (ref 96–112)
Creat: 1.16 mg/dL (ref 0.50–1.35)
Glucose, Bld: 126 mg/dL — ABNORMAL HIGH (ref 70–99)
Potassium: 4.2 mEq/L (ref 3.5–5.3)

## 2012-01-01 NOTE — Patient Instructions (Addendum)
Check to see if your new insurance will pay for  1. Colonoscopy to prevent colon cancer  2. Zostavax Shingles shot to prevent Shingles  Your weight should be 180 lbs.   Try to lose 2 lbs every week  I will call you if your tests are not good.  Otherwise I will send you a letter.  If you do not hear from me with in 2 weeks please call our office.     Try Tylenol only if you have pain but try before you use the Nabumetone  Come back in 3 months unless any problems

## 2012-01-01 NOTE — Assessment & Plan Note (Signed)
Stable control.  Continue current medications Cautioned to try to lose weight as A1c is creeping up

## 2012-01-01 NOTE — Assessment & Plan Note (Signed)
Good control on recheck - had been rushing to get to this visit due to fog

## 2012-01-01 NOTE — Progress Notes (Signed)
  Subjective:    Patient ID: RHONALD SAELENS, male    DOB: 11-Nov-1940, 71 y.o.   MRN: [id number]  HPI HYPERTENSION Disease Monitoring: Blood pressure range- usually good when he checks Chest pain, palpitations- no      Dyspnea- no Medications: Compliance- every day Lightheadedness,Syncope- no   Edema- no   DIABETES Disease Monitoring: Blood Sugar ranges- usually in 120s Polyuria/phagia/dipsia- no      Visual problems- no Medications: Compliance- daily meds Hypoglycemic symptoms- no    HYPERLIPIDEMIA Disease Monitoring: See symptoms for Hypertension Medications: Compliance- pravastatin daily Right upper quadrant pain- no  Muscle aches- no   ROS See HPI above   PMH Smoking Status noted   Review of Systems     Objective:   Physical Exam  Alert healthy appearing Heart - Regular rate and rhythm.  No murmurs, gallops or rubs.    Lungs:  Normal respiratory effort, chest expands symmetrically. Lungs are clear to auscultation, no crackles or wheezes. Extremities:  No cyanosis, edema, or deformity noted with good range of motion of all major joints.         Assessment & Plan:

## 2012-01-01 NOTE — Assessment & Plan Note (Signed)
Very near goal - work on diet

## 2012-01-07 ENCOUNTER — Other Ambulatory Visit: Payer: Self-pay | Admitting: *Deleted

## 2012-01-07 ENCOUNTER — Encounter: Payer: Self-pay | Admitting: Family Medicine

## 2012-01-07 DIAGNOSIS — I10 Essential (primary) hypertension: Secondary | ICD-10-CM

## 2012-01-07 MED ORDER — ATENOLOL 50 MG PO TABS: 50.0000 mg | ORAL_TABLET | Freq: Two times a day (BID) | ORAL | Status: DC

## 2012-01-16 ENCOUNTER — Other Ambulatory Visit: Payer: Self-pay | Admitting: Family Medicine

## 2012-01-20 ENCOUNTER — Encounter: Payer: Self-pay | Admitting: Family Medicine

## 2012-01-20 ENCOUNTER — Ambulatory Visit (INDEPENDENT_AMBULATORY_CARE_PROVIDER_SITE_OTHER): Payer: Medicare (Managed Care) | Admitting: Family Medicine

## 2012-01-20 VITALS — BP 136/95 | HR 73 | Temp 98.5°F | Ht 71.0 in | Wt 189.0 lb

## 2012-01-20 DIAGNOSIS — R531 Weakness: Secondary | ICD-10-CM | POA: Insufficient documentation

## 2012-01-20 DIAGNOSIS — R5381 Other malaise: Secondary | ICD-10-CM

## 2012-01-20 DIAGNOSIS — R5383 Other fatigue: Secondary | ICD-10-CM

## 2012-01-20 LAB — CBC
MCH: 27.6 pg (ref 26.0–34.0)
MCHC: 33.1 g/dL (ref 30.0–36.0)
MCV: 83.5 fL (ref 78.0–100.0)
Platelets: 347 10*3/uL (ref 150–400)
RDW: 14 % (ref 11.5–15.5)

## 2012-01-20 NOTE — Progress Notes (Signed)
  Subjective:    Patient ID: Arthur [name], male    DOB: December 02, 1940, 71 y.o.   MRN: [id number]  HPI  Dizziness, Cough, Nausea Approximately 4-5 days ago had episode of nausea with vomiting once associated with cough and white phelgm.  This has all resolved and now he only feels somewhat fatigued.  He feels dizzy momentarily when he first sits up in the am but this goes way in a few seconds and does not have again during the day.  He has occasional chills but no fever or chest pain or abdomen pain or rash or shortness of breath or leg swelling  He is also very concerned and insistent about his insurance having changed and wanting his rx faxed to his Research scientist (life sciences).  He dialed his broker and had me discuss on the phone.  Review of Systems     Objective:   Physical Exam  Alert no acute distress Heart - Regular rate and rhythm.  No murmurs, gallops or rubs.    Lungs:  Normal respiratory effort, chest expands symmetrically. Lungs are clear to auscultation, no crackles or wheezes. Abdomen: soft and non-tender without masses, organomegaly or hernias noted.  No guarding or rebound Skin:  Intact without suspicious lesions or rashes Neck:  No deformities, thyromegaly, masses, or tenderness noted.   Supple with full range of motion without pain. Extremities:  No cyanosis, edema, or deformity noted with good range of motion of all major joints.   Mouth - no lesions, mucous membranes are moist, no decaying teeth  Ears:  External ear exam shows no significant lesions or deformities.  Otoscopic examination reveals clear canals, tympanic membranes are intact bilaterally without bulging, retraction, inflammation or discharge. Hearing is grossly normal bilaterall      Assessment & Plan:

## 2012-01-20 NOTE — Patient Instructions (Addendum)
I think you have a virus we will check your blood test.  If it is bad I will call you.  Other wise you should feel better in 1-2 weeks  If you have fever over 101 or are not better in 2 weeks then come back  Ask you insurance agent to have their Mail Order pharmacy either send me a request for medications or call me and leave the name of the pharmacy I can not send medications to anyone other than a pharmacy.

## 2012-01-20 NOTE — Assessment & Plan Note (Signed)
Most consistent with with post viral syndrome without and specific focal pain or vs changes.  However given the vagueness of his symptoms will check labs and follow closely

## 2012-01-27 ENCOUNTER — Telehealth: Payer: Self-pay | Admitting: Family Medicine

## 2012-01-27 NOTE — Telephone Encounter (Signed)
Advised pt of normal CBC, pt states he still has dizziness x 2-3 min in the AM and would like something called into [facility] for it. Will forward to Dr Erin Hearing.

## 2012-01-27 NOTE — Telephone Encounter (Signed)
Please call patient back with lab results

## 2012-01-27 NOTE — Telephone Encounter (Signed)
Was dizzy yesterday but feeling better this AM - back to normal.  No chest pain or nausea or vomiting  Told if recurrs should be seen

## 2012-02-03 ENCOUNTER — Other Ambulatory Visit: Payer: Self-pay | Admitting: Family Medicine

## 2012-02-03 DIAGNOSIS — I10 Essential (primary) hypertension: Secondary | ICD-10-CM

## 2012-02-03 MED ORDER — METFORMIN HCL 1000 MG PO TABS: 1000.0000 mg | ORAL_TABLET | Freq: Two times a day (BID) | ORAL | Status: DC

## 2012-02-03 MED ORDER — ATENOLOL 50 MG PO TABS: 50.0000 mg | ORAL_TABLET | Freq: Two times a day (BID) | ORAL | Status: DC

## 2012-02-03 MED ORDER — HYDROCHLOROTHIAZIDE 25 MG PO TABS: 25.0000 mg | ORAL_TABLET | Freq: Every day | ORAL | Status: DC

## 2012-02-03 MED ORDER — AMLODIPINE BESYLATE 10 MG PO TABS: 10.0000 mg | ORAL_TABLET | Freq: Every day | ORAL | Status: DC

## 2012-02-03 MED ORDER — OMEPRAZOLE 20 MG PO CPDR: 20.0000 mg | DELAYED_RELEASE_CAPSULE | Freq: Every day | ORAL | Status: DC | PRN

## 2012-02-03 MED ORDER — LOSARTAN POTASSIUM 100 MG PO TABS: 100.0000 mg | ORAL_TABLET | Freq: Every day | ORAL | Status: DC

## 2012-02-03 MED ORDER — PRAVASTATIN SODIUM 40 MG PO TABS: 40.0000 mg | ORAL_TABLET | Freq: Every day | ORAL | Status: DC

## 2012-02-03 MED ORDER — NABUMETONE 500 MG PO TABS: 500.0000 mg | ORAL_TABLET | Freq: Two times a day (BID) | ORAL | Status: DC | PRN

## 2012-02-03 NOTE — Telephone Encounter (Signed)
Pt states that he dropped off a mail order form and wants to know if the MD has finished it yet.  States that he will be out of med on Jan 20th and per his insurance company they will not cover anything from [facility], they only cover mail order.  Advised I would send to MD to check the progress of form completion. Fleeger, Salome Spotted

## 2012-02-03 NOTE — Telephone Encounter (Signed)
Sent via eRx.  Will check to see if they will ship to him tomorrow

## 2012-02-03 NOTE — Telephone Encounter (Signed)
Patient is very difficult to understand.  He is calling about his medication with his insurance change.

## 2012-02-04 NOTE — Telephone Encounter (Signed)
Called [facility] [phone].  They report rec'd the Rxs and should ship in 2 business days  Please inform Mr Cejas  Thanks  [facility]

## 2012-02-04 NOTE — Telephone Encounter (Signed)
LMOVM informing patient. Arthur [name], Arthur [name] Spotted

## 2012-03-11 ENCOUNTER — Ambulatory Visit: Payer: Medicare Other | Admitting: Family Medicine

## 2012-03-30 ENCOUNTER — Ambulatory Visit: Payer: Medicare Other | Admitting: Family Medicine

## 2012-04-15 ENCOUNTER — Ambulatory Visit: Payer: Self-pay | Admitting: Family Medicine

## 2012-04-27 ENCOUNTER — Encounter: Payer: Self-pay | Admitting: Family Medicine

## 2012-04-27 ENCOUNTER — Ambulatory Visit (INDEPENDENT_AMBULATORY_CARE_PROVIDER_SITE_OTHER): Payer: Medicare Other | Admitting: Family Medicine

## 2012-04-27 VITALS — BP 127/68 | HR 85 | Temp 99.0°F | Ht 71.0 in | Wt 189.0 lb

## 2012-04-27 DIAGNOSIS — E119 Type 2 diabetes mellitus without complications: Secondary | ICD-10-CM

## 2012-04-27 DIAGNOSIS — M25511 Pain in right shoulder: Secondary | ICD-10-CM

## 2012-04-27 DIAGNOSIS — M25519 Pain in unspecified shoulder: Secondary | ICD-10-CM

## 2012-04-27 DIAGNOSIS — I10 Essential (primary) hypertension: Secondary | ICD-10-CM

## 2012-04-27 NOTE — Patient Instructions (Addendum)
Take Tylenol acetaminophen 325 mg tabs - 2 tabs three times daily as needed but especially take at night.  If the shoulder is not better in 2-3 weeks then come back and one of my partners can do an injection  Contact your insurance agent about getting the colonoscopy as a prevention test  Come back in 3 months for diabetes check

## 2012-04-27 NOTE — Assessment & Plan Note (Signed)
Well controlled 

## 2012-04-27 NOTE — Assessment & Plan Note (Signed)
Most consistent with mild rotator cuff tendonitis.  Will try tylenol if not helpign he would like an injection

## 2012-04-27 NOTE — Progress Notes (Signed)
  Subjective:    Patient ID: Arthur [name], male    DOB: 1940-08-21, 72 y.o.   MRN: [id number]  HPI  Shoulder pain R shoulder posterior aches after activity but mainly at night when he lies on it.  No injury.  Is somewhat better with relafen.  Had arthritis in his knees but resolved after athroscopy.  No other joint pains.  No rashes  HYPERTENSION Disease Monitoring: Blood pressure range-not checkign Chest pain, palpitations- no      Dyspnea- no Medications: Compliance- daily Lightheadedness,Syncope- no   Edema- no  DIABETES Disease Monitoring: Blood Sugar ranges-not checkign Polyuria/phagia/dipsia- no      Visual problems- no Medications: Compliance- daily metformin Hypoglycemic symptoms- no  HYPERLIPIDEMIA Disease Monitoring: See symptoms for Hypertension Medications: Compliance- daily pravachol Right upper quadrant pain- no  Muscle aches- no  ROS See HPI above   PMH Smoking Status noted  Lab Review   Potassium  Date Value Range Status  01/01/2012 4.2  3.5 - 5.3 mEq/L Final     Sodium  Date Value Range Status  01/01/2012 133* 135 - 145 mEq/L Final     Creat  Date Value Range Status  01/01/2012 1.16  0.50 - 1.35 mg/dL Final         Review of Systems     Objective:   Physical Exam Alert no acute distress R shoulder - FROM with mild pain.  Distal strength and senation and pulse intact.  Mild pain with empty can sign.   No deformity mildly tender post gl hum joint area Neck - FROM no pain Heart - Regular rate and rhythm.  No murmurs, gallops or rubs.    Lungs:  Normal respiratory effort, chest expands symmetrically. Lungs are clear to auscultation, no crackles or wheezes. Extremities:  No cyanosis, edema, or deformity noted with good range of motion of all major joints.          Assessment & Plan:

## 2012-04-27 NOTE — Assessment & Plan Note (Signed)
Well controlled.  - continue current therapy

## 2012-05-08 ENCOUNTER — Encounter: Payer: Self-pay | Admitting: Family Medicine

## 2012-05-08 ENCOUNTER — Ambulatory Visit (INDEPENDENT_AMBULATORY_CARE_PROVIDER_SITE_OTHER): Payer: Medicare Other | Admitting: Family Medicine

## 2012-05-08 VITALS — BP 128/79 | HR 71 | Temp 98.2°F | Ht 71.0 in | Wt 184.0 lb

## 2012-05-08 DIAGNOSIS — J209 Acute bronchitis, unspecified: Secondary | ICD-10-CM

## 2012-05-08 MED ORDER — BENZONATATE 100 MG PO CAPS: 100.0000 mg | ORAL_CAPSULE | Freq: Three times a day (TID) | ORAL | Status: DC | PRN

## 2012-05-08 NOTE — Patient Instructions (Addendum)
It was a pleasure to see you today.  I am requesting records from the [place] branch of [facility] about your recent ED visit there.   For the cough, I recommend you use a cough suppressant called benzonatate 100mg  capsules, take 1 cap three times a day as needed for cough. This has been sent to your [facility] pharmacy.  Mucinex 600mg , take 2 tablets (1200mg ) by mouth every 12 hours, will thin out mucus and make it easier to bring up.  Plenty of water.   Zyrtec (cetirizine) 10mg  tablets, over-the-counter, as an antihistamine that will not make you sleepy.   Please call if you begin to have fevers again, if you continue to feel weak and fatigued, or with other new problems.  The cough may take several weeks to completely resolve.

## 2012-05-08 NOTE — Assessment & Plan Note (Signed)
Patient treated recently for bronchitis at [facility] with azithromycin; has completed the course of abx now.  Continues with cough, which I have explained may linger for several weeks.  No further fevers, with normal lung exam today.  His fatigue may be partially medication-related (chlorpheniramine) as well as recuperation from this illness.  Overall he reports that he is feeling somewhat better.  No worsening respir status. Plan for guaifenesin mucolytic, as well as non-sedating antihistamine for symptom relief. He is being prescribed benzonatate for cough as well as needed.  To call or come back if recurrent fever, if develops dyspnea (whcih would be new), or other concerns/problems.  Records request from [facility] for results of studies/imaging.

## 2012-05-08 NOTE — Progress Notes (Signed)
  Subjective:    Patient ID: Arthur [name], male    DOB: [birth year], 72 y.o.   MRN: [id number]  HPI Patient here with son for follow up after recent ED visit at [facility] on Sunday, April 6th.  Patient and son say he was diagnosed and treated for "pneumonia"; say he had a CT scan there and some blood work.  These records are not available to me during this visit.  The discharge paperwork he received lists a diagnosis of "bronchitis", and "cough".  He was given azithromycin to take for 5 days (500mg  first day, then 250mg  daily for 4 more days, completed yesterday).  Also chlorpheniramine with codeine, which made his excessively sleepy.  Stopped this two days ago. Ventolin as well, which he has been using.  Worst symptom has been the continued cough, white sputum non-bloody.    Before ED visit had become ill over the course of 1 day, with cough, fever to 103F, malaise.  Since the ED visit and treatment, he has had no more fever. Continues with fatigue and dysgeusia.  Poor appetite.  NO nasal discharge or congestion, no seasonal allergies.  No facial pain. No nausea, vomiting or diarrhea.  No leg swelling or pain. No shortness of breath.  Has had normal oxygenation today and at the ED, per patient's son.   Review of SystemsSee above HPI/ROS.     Objective:   Physical Exam Well appearing, no apparent distress.  Conversant without dyspnea. HEENT Neck supple. Conjunctivae not injected.  Normal nasal mucosa, no sinus tenderness. TMs clear. Clear oropharynx. No cervical adenopathy COR Regular S1S2, no extra sounds PULM Clear bilaterally, no rales or wheezes.  CHEST WALL: some tenderness along left anterior chest with palpation over lower rib (around Rib #10). No step-off, no ecchymosis.  LEs; no calf tenderness or disparate calf girth on exam. No erythema or warmth.       Assessment & Plan:

## 2012-07-22 ENCOUNTER — Ambulatory Visit (INDEPENDENT_AMBULATORY_CARE_PROVIDER_SITE_OTHER): Payer: Medicare Other | Admitting: Family Medicine

## 2012-07-22 ENCOUNTER — Encounter: Payer: Self-pay | Admitting: Family Medicine

## 2012-07-22 VITALS — BP 144/78 | HR 74 | Temp 98.3°F | Wt 194.0 lb

## 2012-07-22 DIAGNOSIS — E785 Hyperlipidemia, unspecified: Secondary | ICD-10-CM

## 2012-07-22 DIAGNOSIS — E119 Type 2 diabetes mellitus without complications: Secondary | ICD-10-CM

## 2012-07-22 DIAGNOSIS — I10 Essential (primary) hypertension: Secondary | ICD-10-CM

## 2012-07-22 LAB — POCT GLYCOSYLATED HEMOGLOBIN (HGB A1C): Hemoglobin A1C: 7.1

## 2012-07-22 NOTE — Assessment & Plan Note (Signed)
Stable check flp next visit

## 2012-07-22 NOTE — Assessment & Plan Note (Signed)
Well controlled continue current medications  

## 2012-07-22 NOTE — Progress Notes (Signed)
  Subjective:    Patient ID: Arthur [name], male    DOB: Feb 13, 1940, 72 y.o.   MRN: [id number]  HPI Pneumonia No further symptoms  HYPERTENSION Disease Monitoring: Blood pressure range-not checking regularly  Chest pain, palpitations- no      Dyspnea- no Medications: Compliance- all medications daily Lightheadedness,Syncope- no   Edema- no  DIABETES Disease Monitoring: Blood Sugar ranges-not checkng  Polyuria/phagia/dipsia- no      Visual problems-no Medications: Compliance- daily Hypoglycemic symptoms- no  HYPERLIPIDEMIA Disease Monitoring: See symptoms for Hypertension Medications: Compliance- daily pravastatin Right upper quadrant pain- no  Muscle aches- no  ROS See HPI above   PMH Smoking Status noted  Lab Review   Potassium  Date Value Range Status  01/01/2012 4.2  3.5 - 5.3 mEq/L Final     Sodium  Date Value Range Status  01/01/2012 133* 135 - 145 mEq/L Final     Creat  Date Value Range Status  01/01/2012 1.16  0.50 - 1.35 mg/dL Final    Review of Symptoms - see HPI  PMH - Smoking status noted.       Review of Systems     Objective:   Physical Exam Alert no acute distress See foot exam form Heart - Regular rate and rhythm.  No murmurs, gallops or rubs.    Lungs:  Normal respiratory effort, chest expands symmetrically. Lungs are clear to auscultation, no crackles or wheezes. Extremities:  No cyanosis, edema, or deformity noted with good range of motion of all major joints.          Assessment & Plan:

## 2012-07-22 NOTE — Patient Instructions (Addendum)
  Keep taking all the medications as your are  Check your feet each night to make sure no sores or red areas  Consider a colonoscopy to look for colon cancer  Come back in October - come in fasting no food for 8 hours

## 2012-07-22 NOTE — Assessment & Plan Note (Signed)
Reasonable control given his risks and that he is on 4 drugs

## 2012-08-06 ENCOUNTER — Other Ambulatory Visit: Payer: Self-pay

## 2012-08-07 ENCOUNTER — Telehealth: Payer: Self-pay | Admitting: *Deleted

## 2012-08-07 NOTE — Telephone Encounter (Signed)
Verbal order given for lancets and strips for glucometer testing to pharmacy. Tildon Husky, RN-BSN

## 2012-09-16 ENCOUNTER — Other Ambulatory Visit: Payer: Self-pay | Admitting: *Deleted

## 2012-09-18 MED ORDER — NABUMETONE 500 MG PO TABS: 500.0000 mg | ORAL_TABLET | Freq: Two times a day (BID) | ORAL | Status: DC | PRN

## 2012-09-24 ENCOUNTER — Telehealth: Payer: Self-pay | Admitting: Family Medicine

## 2012-09-24 NOTE — Telephone Encounter (Signed)
Will forward to Dr. McDiarmid since he is covering for Dr. Erin Hearing.  Nianna Igo,CMA

## 2012-09-24 NOTE — Telephone Encounter (Signed)
Pt called because we sent the refill for nabumetone to the wrong pharmacy. He would like it to go to [facility] [place], [address] Blima Rich

## 2012-09-25 MED ORDER — NABUMETONE 500 MG PO TABS: 500.0000 mg | ORAL_TABLET | Freq: Two times a day (BID) | ORAL | Status: DC | PRN

## 2012-09-25 NOTE — Telephone Encounter (Signed)
Pt is aware.  Jazmin Hartsell,CMA  

## 2012-09-25 NOTE — Telephone Encounter (Signed)
Refill to [facility] completed. Please let pt know.

## 2012-09-29 ENCOUNTER — Other Ambulatory Visit: Payer: Self-pay | Admitting: *Deleted

## 2012-09-29 MED ORDER — NABUMETONE 500 MG PO TABS: 500.0000 mg | ORAL_TABLET | Freq: Two times a day (BID) | ORAL | Status: DC | PRN

## 2012-09-29 NOTE — Telephone Encounter (Signed)
Please let patient know that his refill for 90 days of nabumetone was sent into his mail order pharmacy.

## 2012-10-20 ENCOUNTER — Encounter: Payer: Self-pay | Admitting: Family Medicine

## 2012-10-20 ENCOUNTER — Ambulatory Visit (INDEPENDENT_AMBULATORY_CARE_PROVIDER_SITE_OTHER): Payer: Medicare Other | Admitting: Family Medicine

## 2012-10-20 VITALS — BP 124/71 | HR 80 | Temp 98.4°F | Wt 189.0 lb

## 2012-10-20 DIAGNOSIS — J069 Acute upper respiratory infection, unspecified: Secondary | ICD-10-CM

## 2012-10-20 DIAGNOSIS — Z23 Encounter for immunization: Secondary | ICD-10-CM

## 2012-10-20 NOTE — Progress Notes (Signed)
Subjective:     Patient ID: ARSHAAN HOSTLER, male   DOB: 1940-05-27, 72 y.o.   MRN: [id number]  HPI 72 y.o. M presents with cough that started on firday associated fever and chills. Fevers and chills have resolved. At night time he is cough and same pain on sides. Pt has had decreased appetite.  No sick contacts.  Review of Systems Fever/chills on firday. Persistent cough productive. Tolerating PO, no urinary symptoms or bowel symptoms.     Objective:   Physical Exam  Constitutional: He appears well-developed and well-nourished. No distress.  HENT:  Head: Normocephalic.  Eyes: Conjunctivae and EOM are normal. Right eye exhibits no discharge. Left eye exhibits no discharge.  Neck: Normal range of motion. Neck supple. No tracheal deviation present. No thyromegaly present.  Cardiovascular: Normal rate, regular rhythm, normal heart sounds and intact distal pulses.  Exam reveals no gallop and no friction rub.   No murmur heard. Pulmonary/Chest: Effort normal and breath sounds normal. No respiratory distress. He has no wheezes. He has no rales. He exhibits no tenderness.  Lymphadenopathy:    He has no cervical adenopathy.  Skin: He is not diaphoretic.       Assessment:     72 y.o. M with viral respiratory infection.     Plan:     Encourage fluid intake Conitinue cough syrup Flu shot today Good hygene Return for fever or worsening symptoms Cautioned cough may persist for up to 18days.

## 2012-10-20 NOTE — Patient Instructions (Signed)

## 2012-10-28 ENCOUNTER — Ambulatory Visit (INDEPENDENT_AMBULATORY_CARE_PROVIDER_SITE_OTHER): Payer: Medicare Other | Admitting: Family Medicine

## 2012-10-28 ENCOUNTER — Encounter: Payer: Self-pay | Admitting: Family Medicine

## 2012-10-28 VITALS — BP 140/78 | HR 72 | Temp 98.4°F | Ht 71.0 in | Wt 184.0 lb

## 2012-10-28 DIAGNOSIS — E785 Hyperlipidemia, unspecified: Secondary | ICD-10-CM

## 2012-10-28 DIAGNOSIS — I10 Essential (primary) hypertension: Secondary | ICD-10-CM

## 2012-10-28 DIAGNOSIS — E119 Type 2 diabetes mellitus without complications: Secondary | ICD-10-CM

## 2012-10-28 DIAGNOSIS — E669 Obesity, unspecified: Secondary | ICD-10-CM

## 2012-10-28 LAB — POCT GLYCOSYLATED HEMOGLOBIN (HGB A1C): Hemoglobin A1C: 6.7

## 2012-10-28 NOTE — Progress Notes (Addendum)
  Subjective:    Patient ID: Arthur [name], male    DOB: 1940/11/06, 72 y.o.   MRN: [id number]  HPI HYPERTENSION Disease Monitoring: Blood pressure range-not cheking at home Chest pain, palpitations- no      Dyspnea- no Medications: Compliance- did not bring bottles.  Not sure if he is taking losartan or not Lightheadedness,Syncope- no   Edema- no  DIABETES Disease Monitoring: Blood Sugar ranges-not checking Polyuria/phagia/dipsia- no      Visual problems- no Medications: Compliance- metformin Hypoglycemic symptoms- no  HYPERLIPIDEMIA Disease Monitoring: See symptoms for Hypertension Medications: Compliance- pravastatin Right upper quadrant pain- no  Muscle aches- no  URI better.  Cough medicine makes him sleepy.  No fever or shortness of breath   ROS See HPI above   PMH Smoking Status noted  Lab Review   Potassium  Date Value Range Status  01/01/2012 4.2  3.5 - 5.3 mEq/L Final     Sodium  Date Value Range Status  01/01/2012 133* 135 - 145 mEq/L Final     Creat  Date Value Range Status  01/01/2012 1.16  0.50 - 1.35 mg/dL Final         Review of Systems     Objective:   Physical Exam  Alert no acute distress Heart - Regular rate and rhythm.  No murmurs, gallops or rubs.    Lungs:  Normal respiratory effort, chest expands symmetrically. Lungs are clear to auscultation, no crackles or wheezes. Diabetic Foot Check -  Appearance - no lesions, ulcers or calluses Skin - no unusual pallor or redness Monofilament testing -  Right - Great toe, medial, central, lateral ball and posterior foot intact Left - Great toe, medial, central, lateral ball and posterior foot intact     Assessment & Plan:

## 2012-10-28 NOTE — Assessment & Plan Note (Signed)
Improved today.

## 2012-10-28 NOTE — Assessment & Plan Note (Signed)
Fair control.  Asked to check to make sure he is taking all 4 antihtn Blood work next visit

## 2012-10-28 NOTE — Patient Instructions (Addendum)
Good to see you today!  Thanks for coming in.  Call the gastroenterolgist to get a Colonoscopy  Check with your insurance they should pay for a colonoscopy to prevent colon cancer   Check all the medication bottles at home with this list to make sure you are taking  Your diabetes is going very well  Come back the end of January  Call if you need refills in December

## 2012-10-28 NOTE — Assessment & Plan Note (Signed)
Good control

## 2012-10-28 NOTE — Assessment & Plan Note (Signed)
Stable - check labs next visit

## 2012-11-06 ENCOUNTER — Encounter (HOSPITAL_COMMUNITY): Payer: Self-pay | Admitting: Emergency Medicine

## 2012-11-06 ENCOUNTER — Emergency Department (HOSPITAL_COMMUNITY)
Admission: EM | Admit: 2012-11-06 | Discharge: 2012-11-06 | Disposition: A | Discharge status: Home / Self Care | Payer: Medicare Other | Source: Home / Self Care | Attending: Family Medicine | Admitting: Family Medicine

## 2012-11-06 DIAGNOSIS — L250 Unspecified contact dermatitis due to cosmetics: Secondary | ICD-10-CM

## 2012-11-06 MED ORDER — FLUTICASONE PROPIONATE 0.05 % EX CREA: TOPICAL_CREAM | Freq: Two times a day (BID) | CUTANEOUS | Status: DC

## 2012-11-06 NOTE — ED Notes (Signed)
Rash on left ear /face x 1 week

## 2012-11-06 NOTE — ED Provider Notes (Signed)
CSN: [id number]     Arrival date & time 11/06/12  1044 History   First MD Initiated Contact with Patient 11/06/12 1104     Chief Complaint  Patient presents with  . Rash   (Consider location/radiation/quality/duration/timing/severity/associated sxs/prior Treatment) Patient is a 72 y.o. male presenting with rash. The history is provided by the patient.  Rash Pain severity:  No pain Onset quality:  Gradual Duration:  1 week Progression:  Unchanged Chronicity:  New Context comment:  Used new cream on ear and rash developed.   Past Medical History  Diagnosis Date  . Normal nuclear stress test 2010    stress perfusion study apparently in 2010 in [facility] which he said was negative.   Past Surgical History  Procedure Laterality Date  . Knee surgery  2005  . Cataract extraction, bilateral  2006   Family History  Problem Relation Age of Onset  . Coronary artery disease Father   . Coronary artery disease Mother    History  Substance Use Topics  . Smoking status: Never Smoker   . Smokeless tobacco: Never Used  . Alcohol Use: Not on file    Review of Systems  Constitutional: Negative.   Skin: Positive for rash.    Allergies  Ace inhibitors  Home Medications   Current Outpatient Rx  Name  Route  Sig  Dispense  Refill  . acetaminophen (TYLENOL) 325 MG tablet   Oral   Take 650 mg by mouth every 6 (six) hours as needed.         Marland Kitchen amLODipine (NORVASC) 10 MG tablet   Oral   Take 1 tablet (10 mg total) by mouth at bedtime.   90 tablet   3   . atenolol (TENORMIN) 50 MG tablet   Oral   Take 1 tablet (50 mg total) by mouth 2 (two) times daily.   180 tablet   3   . fluticasone (CUTIVATE) 0.05 % cream   Topical   Apply topically 2 (two) times daily.   30 g   0   . hydrochlorothiazide (HYDRODIURIL) 25 MG tablet   Oral   Take 1 tablet (25 mg total) by mouth daily.   90 tablet   3   . losartan (COZAAR) 100 MG tablet   Oral   Take 1 tablet (100 mg  total) by mouth daily.   90 tablet   3   . metFORMIN (GLUCOPHAGE) 1000 MG tablet   Oral   Take 1 tablet (1,000 mg total) by mouth 2 (two) times daily with a meal.   180 tablet   3   . nabumetone (RELAFEN) 500 MG tablet   Oral   Take 1 tablet (500 mg total) by mouth 2 (two) times daily as needed.   90 tablet   1   . omeprazole (PRILOSEC) 20 MG capsule   Oral   Take 1 capsule (20 mg total) by mouth daily as needed.   60 capsule   3   . pravastatin (PRAVACHOL) 40 MG tablet   Oral   Take 1 tablet (40 mg total) by mouth daily.   90 tablet   3    BP 131/72  Pulse 80  Temp(Src) 98.2 F (36.8 C) (Oral)  SpO2 97% Physical Exam  Nursing note and vitals reviewed. Constitutional: He is oriented to person, place, and time. He appears well-developed and well-nourished. No distress.  Neurological: He is alert and oriented to person, place, and time.  Skin: Skin is warm  and dry. Rash noted.  Hyperemia, dry crusting patch to left post auricular skin. No infection.    ED Course  Procedures (including critical care time) Labs Review Labs Reviewed - No data to display Imaging Review No results found.  EKG Interpretation     Ventricular Rate:    PR Interval:    QRS Duration:   QT Interval:    QTC Calculation:   R Axis:     Text Interpretation:              MDM      Billy Fischer, MD 11/06/12 1115

## 2012-11-09 ENCOUNTER — Ambulatory Visit: Payer: Medicare Other | Admitting: Family Medicine

## 2012-11-23 ENCOUNTER — Encounter: Payer: Self-pay | Admitting: Family Medicine

## 2012-11-23 ENCOUNTER — Ambulatory Visit (INDEPENDENT_AMBULATORY_CARE_PROVIDER_SITE_OTHER): Payer: Medicare Other | Admitting: Family Medicine

## 2012-11-23 VITALS — BP 136/78 | HR 74 | Temp 98.1°F | Wt 190.0 lb

## 2012-11-23 DIAGNOSIS — I10 Essential (primary) hypertension: Secondary | ICD-10-CM

## 2012-11-23 NOTE — Progress Notes (Signed)
  Subjective:    Patient ID: Arthur [name], male    DOB: [birth year], 72 y.o.   MRN: [id number]  HPI  Rash on Face Had rash on left ear - treated in ER -see note for details Used cream (flourinated steroid) on left ear and right ear and maybe face. Now has irritated rash on right face.  Ears improved.  No fever or other rash or oral lesions  HYPERTENSION Disease Monitoring Home BP Monitoring Not checking but normal elsewhere Chest pain- no    Dyspnea- no Medications Compliance-  Taking all medications and brought in bottles. Lightheadedness-  no  Edema- no ROS - See HPI  PMH Lab Review   Potassium  Date Value Range Status  01/01/2012 4.2  3.5 - 5.3 mEq/L Final     Sodium  Date Value Range Status  01/01/2012 133* 135 - 145 mEq/L Final     Creat  Date Value Range Status  01/01/2012 1.16  0.50 - 1.35 mg/dL Final        Review of Symptoms - see HPI  PMH - Smoking status noted.       Review of Systems     Objective:   Physical Exam  Face - slightly reddish slightly excoriated rash on right face poorly demarcated.  Non tender Ears - hyperpigmented with dry skin       Assessment & Plan:   Rash - likely contact or related to high potency steroid - asked him to stop and use antibacterial soap for any secondary infection

## 2012-11-23 NOTE — Patient Instructions (Addendum)
Good to see you today!  Thanks for coming in.  Use Lever 2000 soap (antibacterial soap) on your face and do not shave for one week   If the rash is not better in one week or if getting worse then come back  Call about the [employer]

## 2012-11-23 NOTE — Assessment & Plan Note (Signed)
Controlled.  On Recheck

## 2012-12-21 ENCOUNTER — Telehealth: Payer: Self-pay

## 2012-12-21 NOTE — Telephone Encounter (Signed)
Refill request for all patient's medicines sent to [facility] before beginning of 2015. Insurance may possibly be changing.

## 2013-01-05 ENCOUNTER — Encounter: Payer: Self-pay | Admitting: Family Medicine

## 2013-01-05 ENCOUNTER — Ambulatory Visit (INDEPENDENT_AMBULATORY_CARE_PROVIDER_SITE_OTHER): Payer: Medicare Other | Admitting: Family Medicine

## 2013-01-05 VITALS — BP 135/71 | HR 86 | Temp 98.6°F | Ht 71.0 in | Wt 189.0 lb

## 2013-01-05 DIAGNOSIS — R059 Cough, unspecified: Secondary | ICD-10-CM

## 2013-01-05 DIAGNOSIS — R05 Cough: Secondary | ICD-10-CM | POA: Insufficient documentation

## 2013-01-05 DIAGNOSIS — I10 Essential (primary) hypertension: Secondary | ICD-10-CM

## 2013-01-05 DIAGNOSIS — E119 Type 2 diabetes mellitus without complications: Secondary | ICD-10-CM

## 2013-01-05 LAB — BASIC METABOLIC PANEL
BUN: 21 mg/dL (ref 6–23)
CO2: 30 mEq/L (ref 19–32)
Calcium: 9.8 mg/dL (ref 8.4–10.5)
Chloride: 99 mEq/L (ref 96–112)
Creat: 1.27 mg/dL (ref 0.50–1.35)
Glucose, Bld: 153 mg/dL — ABNORMAL HIGH (ref 70–99)
Potassium: 4 mEq/L (ref 3.5–5.3)
Sodium: 138 mEq/L (ref 135–145)

## 2013-01-05 LAB — CBC
HCT: 40.2 % (ref 39.0–52.0)
Hemoglobin: 12.9 g/dL — ABNORMAL LOW (ref 13.0–17.0)
MCH: 26.7 pg (ref 26.0–34.0)
Platelets: 347 10*3/uL (ref 150–400)
RBC: 4.83 MIL/uL (ref 4.22–5.81)
RDW: 15.1 % (ref 11.5–15.5)
WBC: 9 10*3/uL (ref 4.0–10.5)

## 2013-01-05 MED ORDER — BENZONATATE 200 MG PO CAPS: 200.0000 mg | ORAL_CAPSULE | Freq: Two times a day (BID) | ORAL | Status: DC | PRN

## 2013-01-05 MED ORDER — ATENOLOL 50 MG PO TABS: 50.0000 mg | ORAL_TABLET | Freq: Two times a day (BID) | ORAL | Status: DC

## 2013-01-05 MED ORDER — ONETOUCH ULTRASOFT LANCETS MISC: Status: DC

## 2013-01-05 MED ORDER — PRAVASTATIN SODIUM 40 MG PO TABS: 40.0000 mg | ORAL_TABLET | Freq: Every day | ORAL | Status: DC

## 2013-01-05 MED ORDER — LEVOCETIRIZINE DIHYDROCHLORIDE 5 MG PO TABS: 5.0000 mg | ORAL_TABLET | Freq: Every evening | ORAL | Status: DC

## 2013-01-05 MED ORDER — GLUCOSE BLOOD VI STRP: ORAL_STRIP | Status: DC

## 2013-01-05 MED ORDER — NABUMETONE 500 MG PO TABS: 500.0000 mg | ORAL_TABLET | Freq: Two times a day (BID) | ORAL | Status: DC | PRN

## 2013-01-05 MED ORDER — METFORMIN HCL 1000 MG PO TABS: 1000.0000 mg | ORAL_TABLET | Freq: Two times a day (BID) | ORAL | Status: DC

## 2013-01-05 MED ORDER — LOSARTAN POTASSIUM 100 MG PO TABS: 100.0000 mg | ORAL_TABLET | Freq: Every day | ORAL | Status: DC

## 2013-01-05 MED ORDER — AMLODIPINE BESYLATE 10 MG PO TABS: 10.0000 mg | ORAL_TABLET | Freq: Every day | ORAL | Status: DC

## 2013-01-05 MED ORDER — HYDROCHLOROTHIAZIDE 25 MG PO TABS: 25.0000 mg | ORAL_TABLET | Freq: Every day | ORAL | Status: DC

## 2013-01-05 MED ORDER — OMEPRAZOLE 20 MG PO CPDR: 20.0000 mg | DELAYED_RELEASE_CAPSULE | Freq: Every day | ORAL | Status: DC | PRN

## 2013-01-05 NOTE — Progress Notes (Signed)
  Patient name: Arthur [name] MRN [id number]  Date of birth: [birth year]  CC & HPI:  Arthur [name] is a 72 y.o. male presenting today for chronic disease management. He also wants to discuss his cough.  Cough He reports nonproductive cough that has been present for several months. He says that it has occurred in the wintertime for the last few years. It is present throughout the day and night, but is most bothersome when he keeps him awake.  He denies any fever, sore throat or symptoms of URI.  Denies any history of asthma or allergies, does not smoke, and denies symptoms of heartburn. He has tried OTC medications without relief, and was previously getting a prescription of Tussionex, which helps but also makes him drowsy. He is not currently on an ACE inhibitor.  DIABETES  Symptoms of Hyperglycemia? no  Comorbid Symptoms: No Chest pain; no SOB; no Neuropathy: no Vision problems  Medication Compliance: yes   Medication Side Effects: no  Disease Monitoring  A1c: recently checked within normal limits  Lipids: currently on statin  BP: well-controlled  Renal fcn: within normal limits  Preventative Health Care  Eye Exam: up-to-date  Foot Exam: up-to-date  Vaccinations: up to date on flu   CHRONIC HYPERTENSION BP Readings from Last 3 Encounters:  01/05/13 135/71  11/23/12 136/78  11/06/12 131/72    Disease Monitoring  Chest pain: no   Dyspnea: no   Claudication: no  Medication compliance: yes  Medication Side Effects: No  Hyperlipidemia  Medication Compliance: yes  Side Effects?: no   ROS: See HPI above otherwise negative.  Medical & Surgical Hx:  Reviewed.  Medications & Allergies: Reviewed & Updated - see associated section Social History: Reviewed:   Objective Findings:  Vitals: BP 135/71  Pulse 86  Temp(Src) 98.6 F (37 C) (Oral)  Ht 5\' 11"  (1.803 m)  Wt 189 lb (85.73 kg)  BMI 26.37 kg/m2  Gen: NAD HEENT: nasal turbinate swollen; pharyngeal erythema;  PERRLA; EOMI CV: RRR w/o m/r/g, pulses +2 b/l Resp: CTAB w/ normal respiratory effort  Assessment & Plan:   Please See Problem Focused Assessment & Plan

## 2013-01-05 NOTE — Patient Instructions (Signed)
It was great seeing you today.   1. We will check some labs today and notify you of the results 2. Start Antihistamine daily to help with your cough, and use Tessalon pearls at night 3. Call you insurance company to determine the cost of the colonscopy    Please bring all your medications to every doctors visit  Sign up for My Chart to have easy access to your labs results, and communication with your Primary care physician.   Please check-out at the front desk before leaving the clinic.   I look forward to talking with you again at our next visit. If you have any questions or concerns before then, please call the clinic at [phone].  Take Care,   Dr Phill Myron

## 2013-01-05 NOTE — Assessment & Plan Note (Signed)
Pertinent S&O  Chronic, recurring mostly during the wintertime, Nonproductive, without fever or URI symptoms   No symptoms of GERD on PPI occasionally when he takes NSAIDs   No history of asthma/allergies; pharyngeal cobblestoning, erythematous nasal turbinates Assessment  DDx: postnasal drip from allergies, silent GERD Plan  start antihistamine, Tessalon Perles  Stop Tussionex due to drowsiness  Reassess for GERD if cough persists

## 2013-01-05 NOTE — Assessment & Plan Note (Signed)
Well controlled, continue current medications - Check bmet today

## 2013-01-05 NOTE — Assessment & Plan Note (Signed)
Well-controlled continue current medications - check microalbuminuria today

## 2013-01-06 LAB — MICROALBUMIN, URINE: Microalb, Ur: 0.79 mg/dL (ref 0.00–1.89)

## 2013-01-11 ENCOUNTER — Other Ambulatory Visit: Payer: Self-pay | Admitting: Family Medicine

## 2013-01-11 MED ORDER — ONETOUCH ULTRA 2 W/DEVICE KIT: 1.0000 | PACK | Freq: Once | Status: DC

## 2013-01-12 NOTE — Telephone Encounter (Signed)
Done during 12-9 visit

## 2013-01-18 ENCOUNTER — Telehealth: Payer: Self-pay | Admitting: Family Medicine

## 2013-01-18 DIAGNOSIS — E119 Type 2 diabetes mellitus without complications: Secondary | ICD-10-CM

## 2013-01-18 MED ORDER — ONETOUCH ULTRASOFT LANCETS MISC: Status: DC

## 2013-01-18 MED ORDER — GLUCOSE BLOOD VI STRP: ORAL_STRIP | Status: DC

## 2013-01-18 MED ORDER — ONETOUCH ULTRA 2 W/DEVICE KIT: 1.0000 | PACK | Freq: Once | Status: DC

## 2013-01-18 NOTE — Telephone Encounter (Signed)
Rx for One Touch Ultra, glucose strips, and lancets sent to [facility] on [address].

## 2013-01-18 NOTE — Telephone Encounter (Signed)
Pt called because the mail order does not cover his Ultra One Touch meter, needles, and lancets. Can we send prescriptions for all three of those to the [facility] on [address]. jw

## 2013-01-19 ENCOUNTER — Telehealth: Payer: Self-pay | Admitting: Family Medicine

## 2013-01-19 DIAGNOSIS — E119 Type 2 diabetes mellitus without complications: Secondary | ICD-10-CM

## 2013-01-19 NOTE — Telephone Encounter (Signed)
Spoke with pharmacy and they stated that rx needs to have dx code as well as how many times to check glucose.  Spoke with pt and let him know that he needs to take his red, white and blue card when he picks up his strips.  He also checks his sugar once a day.  [facility] rewrite [facility] [address]. Evetta Renner,CMA

## 2013-01-19 NOTE — Telephone Encounter (Signed)
Pt called and needs [place] to send his prescription to [facility] on [address]. The [facility] on [address] said that they do not have the medications there. jw

## 2013-01-20 MED ORDER — GLUCOSE BLOOD VI STRP: ORAL_STRIP | Status: DC

## 2013-01-20 MED ORDER — ONETOUCH ULTRA 2 W/DEVICE KIT: 1.0000 | PACK | Freq: Once | Status: DC

## 2013-01-20 MED ORDER — ONETOUCH ULTRASOFT LANCETS MISC: Status: DC

## 2013-01-25 MED ORDER — GLUCOSE BLOOD VI STRP: ORAL_STRIP | Status: DC

## 2013-01-25 MED ORDER — ONETOUCH ULTRASOFT LANCETS MISC: Status: DC

## 2013-01-25 MED ORDER — ONETOUCH ULTRA 2 W/DEVICE KIT: 1.0000 | PACK | Freq: Once | Status: DC

## 2013-01-25 NOTE — Telephone Encounter (Signed)
Pt is aware that rx's were resent to pharmacy. Wakisha Alberts,CMA

## 2013-01-25 NOTE — Telephone Encounter (Signed)
One Touch Ultra device, lancets, and test strips were sent to [facility] on [address] again but they need to be sent [facility] [address]. Once rx's have been sent, please call patient to make aware.

## 2013-04-29 ENCOUNTER — Telehealth: Payer: Self-pay | Admitting: *Deleted

## 2013-04-29 NOTE — Telephone Encounter (Signed)
Received fax from [facility] regarding Tessalon 200 mg Cap are on backorder.  Can this Rx be changed to the 100 mg cap.  If so please send in a new Rx for this medication. Fax number [phone].  Derl Barrow, RN

## 2013-04-30 ENCOUNTER — Other Ambulatory Visit: Payer: Self-pay | Admitting: Family Medicine

## 2013-04-30 MED ORDER — BENZONATATE 100 MG PO CAPS: 100.0000 mg | ORAL_CAPSULE | Freq: Two times a day (BID) | ORAL | Status: DC | PRN

## 2013-05-03 ENCOUNTER — Encounter: Payer: Self-pay | Admitting: Family Medicine

## 2013-05-03 ENCOUNTER — Ambulatory Visit (INDEPENDENT_AMBULATORY_CARE_PROVIDER_SITE_OTHER): Payer: Medicare Other | Admitting: Family Medicine

## 2013-05-03 VITALS — BP 136/73 | HR 77 | Temp 98.1°F | Wt 192.0 lb

## 2013-05-03 DIAGNOSIS — R05 Cough: Secondary | ICD-10-CM

## 2013-05-03 DIAGNOSIS — E119 Type 2 diabetes mellitus without complications: Secondary | ICD-10-CM

## 2013-05-03 DIAGNOSIS — R059 Cough, unspecified: Secondary | ICD-10-CM

## 2013-05-03 LAB — POCT GLYCOSYLATED HEMOGLOBIN (HGB A1C): Hemoglobin A1C: 6.9

## 2013-05-03 MED ORDER — LEVOCETIRIZINE DIHYDROCHLORIDE 5 MG PO TABS: 5.0000 mg | ORAL_TABLET | Freq: Every evening | ORAL | Status: DC

## 2013-05-03 MED ORDER — GLUCOSE BLOOD VI STRP: ORAL_STRIP | Status: DC

## 2013-05-03 MED ORDER — ONETOUCH ULTRA 2 W/DEVICE KIT: 1.0000 | PACK | Freq: Once | Status: DC

## 2013-05-03 MED ORDER — BENZONATATE 100 MG PO CAPS: 100.0000 mg | ORAL_CAPSULE | Freq: Two times a day (BID) | ORAL | Status: DC | PRN

## 2013-05-03 NOTE — Assessment & Plan Note (Signed)
A: A1c well controlled. Higher than before P: New Rx for meter and strips sent to preferred pharmacy. RTC 3-6 months for comprehensive  DM2 f/u visit with PCP.

## 2013-05-03 NOTE — Patient Instructions (Signed)
Arthur [name],  Thank you for coming in today.  I have sent in tessalon [facility] mg to [facility].  I sent glucose meter and test strips to [facility] [facility].  Excellent A1c.  F/u with Dr. Erin Hearing in 3-6 months.  Dr. Adrian Blackwater

## 2013-05-03 NOTE — Assessment & Plan Note (Signed)
A: chronic. Improved with tessalon. Now likely allergy mediated. P: refilled tessalon and sent to preferred pharmacy.

## 2013-05-03 NOTE — Progress Notes (Signed)
   Subjective:    Patient ID: Arthur [name], male    DOB: [birth year], 73 y.o.   MRN: [id number]  HPI 73 yo M presents for SD visit to discuss the following:  1. Cough: chronic. Last seen in office 12/14. Occurs throughout the day. Worsened with exposure to outside air. Patient compliant with levocetirizine.  No associated CP or SOB. No associated heartburn or emesis. Improved with tessalon. Request refill of 100 mg tablets sent to [facility] on [address].   2. DM2: compliant low carb diet. Needs a new meter with strips. No subjective hypoglycemia. Compliant with metformin.   Review of Systems As per HPI     Objective:   Physical Exam BP 136/73  Pulse 77  Temp(Src) 98.1 F (36.7 C) (Oral)  Wt 192 lb (87.091 kg) Wt Readings from Last 3 Encounters:  05/03/13 192 lb (87.091 kg)  01/05/13 189 lb (85.73 kg)  11/23/12 190 lb (86.183 kg)  General appearance: alert, cooperative and no distress Nose: Nares normal. Septum midline. Mucosa normal. No drainage or sinus tenderness. Throat: lips, mucosa, and tongue normal; teeth and gums normal Lungs: clear to auscultation bilaterally Heart: regular rate and rhythm, S1, S2 normal, no murmur, click, rub or gallop  Lab Results  Component Value Date   HGBA1C 6.9 05/03/2013          Assessment & Plan:

## 2013-05-05 NOTE — Telephone Encounter (Signed)
Medication changed to 100 mg tab on 05/03/2013.  Derl Barrow, RN

## 2013-05-27 ENCOUNTER — Encounter (HOSPITAL_COMMUNITY): Payer: Self-pay | Admitting: Emergency Medicine

## 2013-05-27 ENCOUNTER — Emergency Department (HOSPITAL_COMMUNITY)
Admission: EM | Admit: 2013-05-27 | Discharge: 2013-05-27 | Disposition: A | Discharge status: Home / Self Care | Payer: Medicare Other | Source: Home / Self Care | Attending: Emergency Medicine | Admitting: Emergency Medicine

## 2013-05-27 ENCOUNTER — Emergency Department (INDEPENDENT_AMBULATORY_CARE_PROVIDER_SITE_OTHER): Payer: Medicare Other

## 2013-05-27 DIAGNOSIS — R112 Nausea with vomiting, unspecified: Secondary | ICD-10-CM

## 2013-05-27 LAB — CBC
HEMATOCRIT: 38.2 % — AB (ref 39.0–52.0)
Hemoglobin: 12.6 g/dL — ABNORMAL LOW (ref 13.0–17.0)
MCH: 26.4 pg (ref 26.0–34.0)
MCHC: 33 g/dL (ref 30.0–36.0)
MCV: 79.9 fL (ref 78.0–100.0)
Platelets: 320 10*3/uL (ref 150–400)
RBC: 4.78 MIL/uL (ref 4.22–5.81)
RDW: 15 % (ref 11.5–15.5)
WBC: 9.7 10*3/uL (ref 4.0–10.5)

## 2013-05-27 LAB — POCT URINALYSIS DIP (DEVICE)
BILIRUBIN URINE: NEGATIVE
GLUCOSE, UA: NEGATIVE mg/dL
HGB URINE DIPSTICK: NEGATIVE
Ketones, ur: NEGATIVE mg/dL
LEUKOCYTES UA: NEGATIVE
NITRITE: NEGATIVE
Protein, ur: NEGATIVE mg/dL
Specific Gravity, Urine: 1.015 (ref 1.005–1.030)
Urobilinogen, UA: 0.2 mg/dL (ref 0.0–1.0)
pH: 6.5 (ref 5.0–8.0)

## 2013-05-27 LAB — COMPREHENSIVE METABOLIC PANEL
ALT: 13 U/L (ref 0–53)
AST: 16 U/L (ref 0–37)
Albumin: 3.7 g/dL (ref 3.5–5.2)
Alkaline Phosphatase: 67 U/L (ref 39–117)
BILIRUBIN TOTAL: 0.6 mg/dL (ref 0.3–1.2)
BUN: 15 mg/dL (ref 6–23)
CHLORIDE: 94 meq/L — AB (ref 96–112)
CO2: 25 mEq/L (ref 19–32)
CREATININE: 1.14 mg/dL (ref 0.50–1.35)
Calcium: 9.5 mg/dL (ref 8.4–10.5)
GFR calc Af Amer: 72 mL/min — ABNORMAL LOW (ref >90)
GFR calc non Af Amer: 62 mL/min — ABNORMAL LOW (ref >90)
Glucose, Bld: 159 mg/dL — ABNORMAL HIGH (ref 70–99)
Potassium: 4 mEq/L (ref 3.7–5.3)
Sodium: 136 mEq/L — ABNORMAL LOW (ref 137–147)
TOTAL PROTEIN: 7.7 g/dL (ref 6.0–8.3)

## 2013-05-27 LAB — LIPASE, BLOOD: Lipase: 43 U/L (ref 11–59)

## 2013-05-27 IMAGING — CR DG ABDOMEN 2V
2 series · 2 of 2 positions shown · non-contrast
Comparison: [facility] image for CT scan dated [DATE]

CLINICAL DATA: Vomiting.

EXAM:
ABDOMEN - 2 VIEW

[view not recorded (1 of 2)]
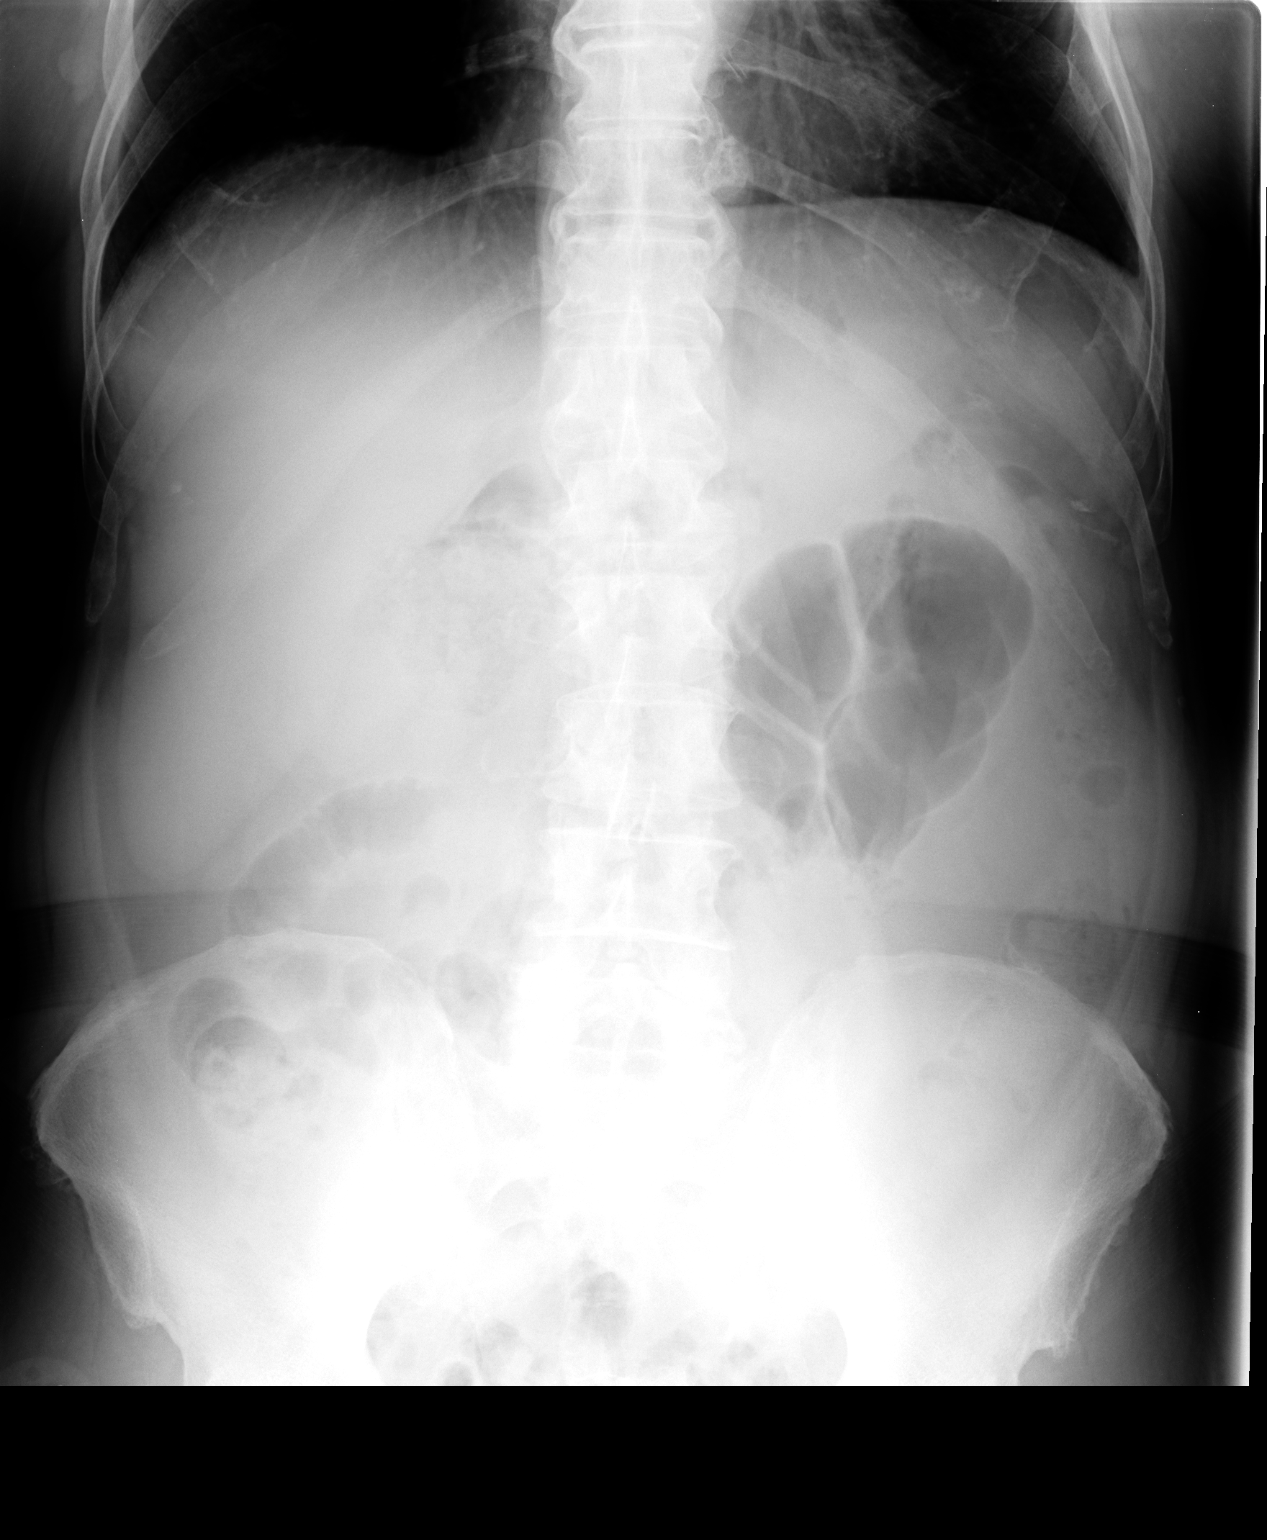

[view not recorded (2 of 2)]
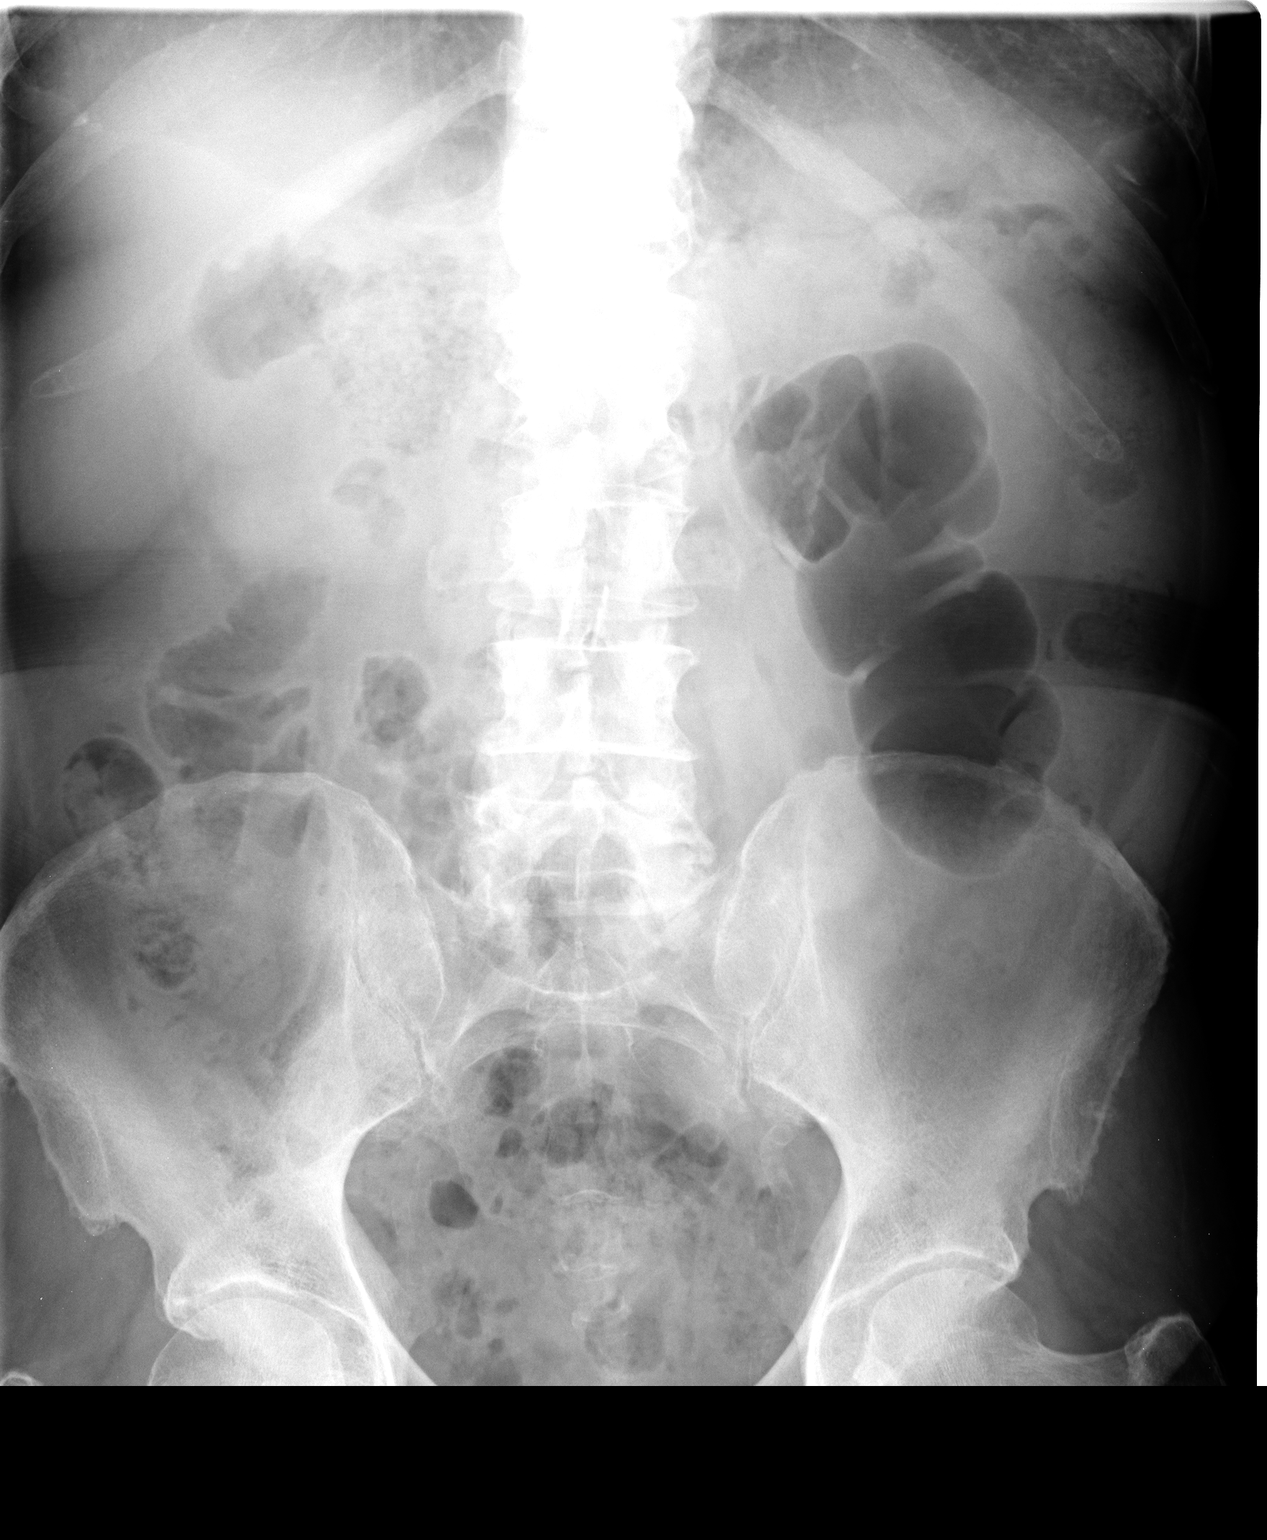

[2 of 2 positions shown; findings below may reference images not displayed]

FINDINGS: There is no free air or free fluid in the abdomen. Bowel gas pattern
is normal. No significant osseous abnormality. No abnormal abdominal
calcifications.
IMPRESSION: Benign appearing abdomen.

## 2013-05-27 MED ORDER — ONDANSETRON HCL 4 MG/2ML IJ SOLN: 8.0000 mg | Freq: Three times a day (TID) | INTRAMUSCULAR | Status: DC | PRN

## 2013-05-27 MED ORDER — ONDANSETRON HCL 4 MG PO TABS: 4.0000 mg | ORAL_TABLET | Freq: Three times a day (TID) | ORAL | Status: DC | PRN

## 2013-05-27 MED ORDER — ONDANSETRON HCL 4 MG/2ML IJ SOLN
INTRAMUSCULAR | Status: AC
  Filled 2013-05-27: qty 4

## 2013-05-27 NOTE — ED Notes (Signed)
Pt c/o abd pain onset yest night Sx include vomiting and fevers Pain increases w/pressure Denies diarrhea, bladder problems; normal BM Alert w/no signs of acute distress.

## 2013-05-27 NOTE — Discharge Instructions (Signed)
Your labs and xrays were all normal. Please use medication as prescribed for your nausea and contact your doctor if symptoms do not improve. If symptoms become worse despite medication, please seek treatment as soon as possible at your nearest emergency room.   Nausea and Vomiting Nausea is a sick feeling that often comes before throwing up (vomiting). Vomiting is a reflex where stomach contents come out of your mouth. Vomiting can cause severe loss of body fluids (dehydration). Children and elderly adults can become dehydrated quickly, especially if they also have diarrhea. Nausea and vomiting are symptoms of a condition or disease. It is important to find the cause of your symptoms. CAUSES   Direct irritation of the stomach lining. This irritation can result from increased acid production (gastroesophageal reflux disease), infection, food poisoning, taking certain medicines (such as nonsteroidal anti-inflammatory drugs), alcohol use, or tobacco use.  Signals from the brain.These signals could be caused by a headache, heat exposure, an inner ear disturbance, increased pressure in the brain from injury, infection, a tumor, or a concussion, pain, emotional stimulus, or metabolic problems.  An obstruction in the gastrointestinal tract (bowel obstruction).  Illnesses such as diabetes, hepatitis, gallbladder problems, appendicitis, kidney problems, cancer, sepsis, atypical symptoms of a heart attack, or eating disorders.  Medical treatments such as chemotherapy and radiation.  Receiving medicine that makes you sleep (general anesthetic) during surgery. DIAGNOSIS Your caregiver may ask for tests to be done if the problems do not improve after a few days. Tests may also be done if symptoms are severe or if the reason for the nausea and vomiting is not clear. Tests may include:  Urine tests.  Blood tests.  Stool tests.  Cultures (to look for evidence of infection).  X-rays or other imaging  studies. Test results can help your caregiver make decisions about treatment or the need for additional tests. TREATMENT You need to stay well hydrated. Drink frequently but in small amounts.You may wish to drink water, sports drinks, clear broth, or eat frozen ice pops or gelatin dessert to help stay hydrated.When you eat, eating slowly may help prevent nausea.There are also some antinausea medicines that may help prevent nausea. HOME CARE INSTRUCTIONS   Take all medicine as directed by your caregiver.  If you do not have an appetite, do not force yourself to eat. However, you must continue to drink fluids.  If you have an appetite, eat a normal diet unless your caregiver tells you differently.  Eat a variety of complex carbohydrates (rice, wheat, potatoes, bread), lean meats, yogurt, fruits, and vegetables.  Avoid high-fat foods because they are more difficult to digest.  Drink enough water and fluids to keep your urine clear or pale yellow.  If you are dehydrated, ask your caregiver for specific rehydration instructions. Signs of dehydration may include:  Severe thirst.  Dry lips and mouth.  Dizziness.  Dark urine.  Decreasing urine frequency and amount.  Confusion.  Rapid breathing or pulse. SEEK IMMEDIATE MEDICAL CARE IF:   You have blood or brown flecks (like coffee grounds) in your vomit.  You have black or bloody stools.  You have a severe headache or stiff neck.  You are confused.  You have severe abdominal pain.  You have chest pain or trouble breathing.  You do not urinate at least once every 8 hours.  You develop cold or clammy skin.  You continue to vomit for longer than 24 to 48 hours.  You have a fever. MAKE SURE YOU:  Understand these instructions.  Will watch your condition.  Will get help right away if you are not doing well or get worse. Document Released: 01/14/2005 Document Revised: 04/08/2011 Document Reviewed:  06/13/2010 [facility] Patient Information 2014 [place], [place].

## 2013-05-27 NOTE — ED Provider Notes (Signed)
CSN: [id number]     Arrival date & time 05/27/13  1742 History   None    Chief Complaint  Patient presents with  . Abdominal Pain   (Consider location/radiation/quality/duration/timing/severity/associated sxs/prior Treatment) HPI Comments: Patient presents with 24 hours of nausea and vomiting with anorexia and general malaise. Denies chest pain, dyspnea, URI sx., diarrhea, fever, abdominal pain, constipation, abdominal pain. States stomach just feels "heavy." No hematemesis of melena. No previous abdominal surgeries. No known ill contacts.  The history is provided by the patient.    Past Medical History  Diagnosis Date  . Normal nuclear stress test 2010    stress perfusion study apparently in 2010 in [facility] which he said was negative.   Past Surgical History  Procedure Laterality Date  . Knee surgery  2005  . Cataract extraction, bilateral  2006   Family History  Problem Relation Age of Onset  . Coronary artery disease Father   . Coronary artery disease Mother    History  Substance Use Topics  . Smoking status: Never Smoker   . Smokeless tobacco: Never Used  . Alcohol Use: Not on file    Review of Systems  Constitutional: Positive for appetite change. Negative for fever, chills and fatigue.  HENT: Negative.   Eyes: Negative.   Respiratory: Negative.   Cardiovascular: Negative.   Gastrointestinal: Positive for nausea and vomiting. Negative for abdominal pain, diarrhea, constipation, blood in stool, abdominal distention, anal bleeding and rectal pain.  Endocrine: Negative for polydipsia, polyphagia and polyuria.  Genitourinary: Negative.   Musculoskeletal: Negative.   Skin: Negative.   Neurological: Negative.     Allergies  Ace inhibitors  Home Medications   Prior to Admission medications   Medication Sig Start Date End Date Taking? Authorizing Provider  amLODipine (NORVASC) 10 MG tablet Take 1 tablet (10 mg total) by mouth at bedtime. 01/05/13  Yes  Phill Myron, MD  atenolol (TENORMIN) 50 MG tablet Take 1 tablet (50 mg total) by mouth 2 (two) times daily. 01/05/13  Yes Phill Myron, MD  hydrochlorothiazide (HYDRODIURIL) 25 MG tablet Take 1 tablet (25 mg total) by mouth daily. 01/05/13 01/05/14 Yes Phill Myron, MD  levocetirizine (XYZAL) 5 MG tablet Take 1 tablet (5 mg total) by mouth every evening. 05/03/13  Yes Josalyn C Funches, MD  losartan (COZAAR) 100 MG tablet Take 1 tablet (100 mg total) by mouth daily. 01/05/13 01/05/14 Yes Phill Myron, MD  metFORMIN (GLUCOPHAGE) 1000 MG tablet Take 1 tablet (1,000 mg total) by mouth 2 (two) times daily with a meal. 01/05/13  Yes Phill Myron, MD  nabumetone (RELAFEN) 500 MG tablet Take 1 tablet (500 mg total) by mouth 2 (two) times daily as needed. 01/05/13  Yes Phill Myron, MD  omeprazole (PRILOSEC) 20 MG capsule Take 1 capsule (20 mg total) by mouth daily as needed. 01/05/13  Yes Phill Myron, MD  pravastatin (PRAVACHOL) 40 MG tablet Take 1 tablet (40 mg total) by mouth daily. 01/05/13  Yes Phill Myron, MD  acetaminophen (TYLENOL) 325 MG tablet Take 650 mg by mouth every 6 (six) hours as needed.    Historical Provider, MD  benzonatate (TESSALON) 100 MG capsule Take 1-2 capsules (100-200 mg total) by mouth 2 (two) times daily as needed for cough. 05/03/13   Josalyn C Funches, MD  Blood Glucose Monitoring Suppl (ONE TOUCH ULTRA 2) W/DEVICE KIT 1 Device by Does not apply route once. 05/03/13   Josalyn C Funches, MD  glucose blood test strip Use as instructed 05/03/13  Minerva Ends, MD  Lancets (ONETOUCH ULTRASOFT) lancets Use as instructed 01/25/13   Blane Ohara McDiarmid, MD   BP 154/81  Pulse 72  Temp(Src) 97.3 F (36.3 C) (Oral)  Resp 18  SpO2 96% Physical Exam  Nursing note and vitals reviewed. Constitutional: He is oriented to person, place, and time. He appears well-developed and well-nourished. No distress.  HENT:  Head: Normocephalic and atraumatic.  Eyes: Conjunctivae are normal. No scleral  icterus.  Neck: Normal range of motion. Neck supple.  Cardiovascular: Normal rate, regular rhythm and normal heart sounds.   Pulmonary/Chest: Effort normal and breath sounds normal. No respiratory distress. He has no wheezes.  Abdominal: Soft. Bowel sounds are normal. He exhibits no ascites and no mass. There is no hepatosplenomegaly. There is no tenderness. There is no rigidity, no guarding, no CVA tenderness, no tenderness at McBurney's point and negative Murphy's sign. No hernia. Hernia confirmed negative in the ventral area.  Musculoskeletal: Normal range of motion.  Neurological: He is alert and oriented to person, place, and time.  Skin: Skin is warm and dry. No rash noted. No erythema.  Psychiatric: He has a normal mood and affect. His behavior is normal.    ED Course  Procedures (including critical care time) Labs Review Labs Reviewed  CBC - Abnormal; Notable for the following:    Hemoglobin 12.6 (*)    HCT 38.2 (*)    All other components within normal limits  COMPREHENSIVE METABOLIC PANEL - Abnormal; Notable for the following:    Sodium 136 (*)    Chloride 94 (*)    Glucose, Bld 159 (*)    GFR calc non Af Amer 62 (*)    GFR calc Af Amer 72 (*)    All other components within normal limits  LIPASE, BLOOD  POCT URINALYSIS DIP (DEVICE)    Imaging Review Dg Abd 2 Views  05/27/2013   CLINICAL DATA:  Vomiting.  EXAM: ABDOMEN - 2 VIEW  COMPARISON:  [facility] image for CT scan dated 05/08/2004  FINDINGS: There is no free air or free fluid in the abdomen. Bowel gas pattern is normal. No significant osseous abnormality. No abnormal abdominal calcifications.  IMPRESSION: Benign appearing abdomen.   Electronically Signed   By: Rozetta Nunnery M.D.   On: 05/27/2013 20:12     MDM   1. Nausea and vomiting in adult    NSR with 1st degree AV block at rate of 69 bpm. No ectopy or ST/T wave changes. CBC: only with mild anemia. Note sent to PCP @ [facility] via EP[facility] for anemia follow up. CMET:  grossly normal for known T2DM UA: normal Lipase normal 2 view abdomen: normal.  vomting resolved following 8 mg zofran IM at [facility]. Tolerating oral fluids after treatment. Will discharge home with recommendations for viral gastroenteritis and Rx for zofran and advise follow up with PCP if symptoms do not improve.     [place], [place] 05/27/13 2050

## 2013-05-28 ENCOUNTER — Telehealth (HOSPITAL_COMMUNITY): Payer: Self-pay

## 2013-05-28 NOTE — ED Provider Notes (Signed)
Medical screening examination/treatment/procedure(s) were performed by non-physician practitioner and as supervising physician I was immediately available for consultation/collaboration.  Philipp Deputy, M.D.  Harden Mo, MD 05/28/13 Dyann Kief

## 2013-05-28 NOTE — ED Notes (Signed)
Patient called stating that Zofran required prior authorization.  Chart reviewed by Dr Georgina Snell.  Patient states no vomiting in 24 hours and is feeling much better.  Given change in patient condition no additional medication was called in.  Patient expressed understanding

## 2013-06-16 ENCOUNTER — Ambulatory Visit: Payer: Self-pay | Admitting: Family Medicine

## 2013-06-30 ENCOUNTER — Ambulatory Visit: Payer: Self-pay | Admitting: Family Medicine

## 2013-07-05 ENCOUNTER — Encounter: Payer: Self-pay | Admitting: Family Medicine

## 2013-07-05 ENCOUNTER — Ambulatory Visit (INDEPENDENT_AMBULATORY_CARE_PROVIDER_SITE_OTHER): Payer: Medicare Other | Admitting: Family Medicine

## 2013-07-05 VITALS — BP 136/76 | HR 77 | Temp 98.4°F | Wt 192.0 lb

## 2013-07-05 DIAGNOSIS — M1990 Unspecified osteoarthritis, unspecified site: Secondary | ICD-10-CM

## 2013-07-05 DIAGNOSIS — I10 Essential (primary) hypertension: Secondary | ICD-10-CM

## 2013-07-05 DIAGNOSIS — K219 Gastro-esophageal reflux disease without esophagitis: Secondary | ICD-10-CM

## 2013-07-05 MED ORDER — OMEPRAZOLE 20 MG PO CPDR: 20.0000 mg | DELAYED_RELEASE_CAPSULE | Freq: Every day | ORAL | Status: DC | PRN

## 2013-07-05 MED ORDER — NABUMETONE 500 MG PO TABS: 500.0000 mg | ORAL_TABLET | Freq: Every day | ORAL | Status: DC

## 2013-07-05 NOTE — Assessment & Plan Note (Signed)
Mainly of knees.  Stable with current regimen.  Cautioned to take tylenol first then nsaids only if needed

## 2013-07-05 NOTE — Assessment & Plan Note (Signed)
Stable  Sounds like having mild hypotension when stands - cautioned to do so slowly before walking immeditately

## 2013-07-05 NOTE — Progress Notes (Signed)
   Subjective:    Patient ID: Arthur [name], male    DOB: 06-22-1940, 73 y.o.   MRN: [id number]  HPI  KNEE PAIN DJD About the same.  Hurts with moving around sometiems.  Still able to walk well.  Uses as needed tylenol relafen.  No soft tissue swelling or redness  HYPERTENSION Disease Monitoring Home BP Monitoring not checking Chest pain- no    Dyspnea- no Medications Compliance-  regularly. Lightheadedness-  Has feeling might fall when standing and immediately walking gets better after a few minutes walking - has not fallen  Edema- no ROS - See HPI  GERD  Disease Monitoring Typical Symptoms:  Stomach burning sometimes with relafen    Bleeding (hematemesis/melena):  no  Food sticking:  no  Weight Loss:  no   Medication Monitoring Compliance:  omeprzole as needed    Recent Pneumonia  no    Chief Complaint noted Review of Symptoms - see HPI PMH - Smoking status noted.     PMH Lab Review   Potassium  Date Value Ref Range Status  05/27/2013 4.0  3.7 - 5.3 mEq/L Final     Sodium  Date Value Ref Range Status  05/27/2013 136* 137 - 147 mEq/L Final     Creat  Date Value Ref Range Status  01/05/2013 1.27  0.50 - 1.35 mg/dL Final     Creatinine, Ser  Date Value Ref Range Status  05/27/2013 1.14  0.50 - 1.35 mg/dL Final            Review of Systems     Objective:   Physical Exam Heart - Regular rate and rhythm.  No murmurs, gallops or rubs.    Lungs:  Normal respiratory effort, chest expands symmetrically. Lungs are clear to auscultation, no crackles or wheezes. Extremities:  No cyanosis, edema, or deformity noted with good range of motion of all major joints.          Assessment & Plan:

## 2013-07-05 NOTE — Assessment & Plan Note (Signed)
Stable without red flags.  Recommend taking ppi intermittently

## 2013-07-05 NOTE — Patient Instructions (Signed)
Good to see you today!  Thanks for coming in.  When walking start by standing for a minute or so then start slowly That should make the falling feeling better  You need to lose weight. Aim to lose 1-2 lbs a week by eating smaller portions.  Aim to be around 180 lbs  I sent in your medicines to [facility]  Come back in October

## 2013-08-24 ENCOUNTER — Telehealth: Payer: Self-pay | Admitting: Family Medicine

## 2013-08-24 MED ORDER — NABUMETONE 500 MG PO TABS: 500.0000 mg | ORAL_TABLET | Freq: Every day | ORAL | Status: DC

## 2013-08-24 NOTE — Telephone Encounter (Signed)
Refill request for nabumetone. Please send to [facility].

## 2013-09-09 ENCOUNTER — Ambulatory Visit (INDEPENDENT_AMBULATORY_CARE_PROVIDER_SITE_OTHER): Payer: Medicare Other | Admitting: Family Medicine

## 2013-09-09 ENCOUNTER — Encounter: Payer: Self-pay | Admitting: Family Medicine

## 2013-09-09 VITALS — BP 120/83 | HR 77 | Temp 98.0°F | Resp 14 | Ht 71.0 in | Wt 193.0 lb

## 2013-09-09 DIAGNOSIS — T148XXA Other injury of unspecified body region, initial encounter: Secondary | ICD-10-CM | POA: Insufficient documentation

## 2013-09-09 DIAGNOSIS — R05 Cough: Secondary | ICD-10-CM

## 2013-09-09 DIAGNOSIS — R059 Cough, unspecified: Secondary | ICD-10-CM

## 2013-09-09 MED ORDER — LEVOCETIRIZINE DIHYDROCHLORIDE 5 MG PO TABS: 5.0000 mg | ORAL_TABLET | Freq: Every evening | ORAL | Status: DC

## 2013-09-09 NOTE — Assessment & Plan Note (Addendum)
Acute on chronic. Thought related to GERD vs postnasal drip from allergies in the past. Same impression today. No signs on hx or exam of pneumonia, COPD, or fluid overload. Improved with tessalon perles. - Continue tessalon perles PRN. - Restart levocetirizine. - Return precautions reviewed. - If no improvement in 1 month, f/u. At that time could consider CXR and contribution of ARB.

## 2013-09-09 NOTE — Progress Notes (Signed)
Patient ID: Arthur [name], male   DOB: 1940/06/12, 73 y.o.   MRN: [id number]  Subjective:   CC: Side pain  HPI:   Side pain Arthur [name] is a 73 y.o. male with h/o GERD who has had 4-5 days of left side/flank pain. He thinks it started when he lifted something heavy at work last week, because at that time he had a sharp pain. He took PRN tylenol. After 3-4 days it got mostly better and is present but mild now. He denies chest pain. He also denies dysuria or back pain.  Cough He also reports starting to cough a week ago, relating this to "weather changes." He denies fevers, chills, dyspnea, chest pain, rhinorrhea, or sore throat. He has not had PND, orthopnea, or leg swelling. Cough has whitish sputum. It is not related to food and he denies bad taste in his mouth or gas. He does think cough is worst in the morning. Tessalon perles which he had from prior issue with cough helps. Has had this issue in the past. Per notes, thought due to GERD vs allergic rhinitis.  Review of Systems - Per HPI. Reports cruise in near future and wants to know what immunizations he needs and recommendation for antidiarrheal, antiemetic, and medication for dizziness.  SH: Denies tobacco use, drugs, or alcohol    Objective:  Physical Exam BP 120/83  Pulse 77  Temp(Src) 98 F (36.7 C)  Resp 14  Ht 5\' 11"  (1.803 m)  Wt 193 lb (87.544 kg)  BMI 26.93 kg/m2 GEN: NAD, pleasant CV: RRR, no m/r/g PULM: CTAB, normal effort ABD: Soft, nontender, nondistended MSK: Left flank not swollen, no erythema or tenderness.  BACK: No CVA tenderness.    Assessment:     Arthur [name] is a 73 y.o. male with h/o GERD here for side pain and cough.    Plan:     Side pain Recent heavy lifting and persistent cough, likely pulled muscle and repeated irritation with cough. Improved over past few days. No erythema, inflammation, or point tenderness on exam. Lungs clear. No CVA tenderness, dysuria, or fevers. - Rest. Work note  provided. - Heat.  - Tylenol PRN. - F/u 2 weeks if no better.  Cough Acute on chronic. Thought related to GERD vs postnasal drip from allergies in the past. Same impression today. No signs on hx or exam of pneumonia, COPD, or fluid overload. Improved with tessalon perles. - Continue tessalon perles PRN. - Restart levocetirizine. - Return precautions reviewed. - If no improvement in 1 month, f/u. At that time could consider CXR and contribution of ARB.  Cruise in near future In about 1 month. Did not evaluate due to sameday for other issues.  - Can use imodium PRN diarrhea as long as no infective symptoms like fever, vomiting, purulent or bloody stool, or abdominal cramping.  Arthur Sinclair, MD [place]

## 2013-09-09 NOTE — Assessment & Plan Note (Signed)
Recent heavy lifting and persistent cough, likely pulled muscle and repeated irritation with cough. Improved over past few days. No erythema, inflammation, or point tenderness on exam. Lungs clear. No CVA tenderness, dysuria, or fevers. - Rest. Work note provided. - Heat.  - Tylenol PRN. - F/u 2 weeks if no better.

## 2013-09-09 NOTE — Patient Instructions (Addendum)
Good to see you.  If you think of that other medication you wanted, let us know. In the meantime, for your cough, start taking levocetirizine again.  Follow up in 1 month if no improvement. Continue tessalon perles as needed but do not take if you do not need. Seek immediate care if you have fevers, trouble breathing, chest pain, or other concerns.  For your side pain, this is likely muscle strain from lifting heavy objects and then coughing. Rest for 2-3 days without lifting heavy objects, use heat therapy, and come see [place] again for reevaluation in 2 weeks if no better. You can take tylenol every 6 hours as needed but do not take more than 3g per day.  Follow up with any provider when convenient with your question about evaluation before a cruise. If you get diarrhea with no fever or signs of infection (nausea/vomiting), you can take imodium (loperamide).  Best,  [name], MD

## 2013-09-10 ENCOUNTER — Encounter: Payer: Self-pay | Admitting: *Deleted

## 2013-09-10 NOTE — Progress Notes (Unsigned)
Prior Authorization form completed by provider for Nabumetone and faxed to [facility] for review.  Derl Barrow, RN

## 2013-09-24 ENCOUNTER — Encounter: Payer: Self-pay | Admitting: Family Medicine

## 2013-09-24 NOTE — Progress Notes (Signed)
Patient is needing medication to use for motion sickness when he goes on a cruise next Saturday.  [facility] on [address] and [address].

## 2013-09-24 NOTE — Progress Notes (Signed)
Given his age, would not recommend usual medications like Dramamine.  Sea-bands (accupressure wrist bands) have fair evidence of efficacy. Ginger lozengers taken as needed can be helpful for motion sickness. .  Both are available over-the-counter at most drug stores.  They are both safe in older adults.

## 2013-09-24 NOTE — Progress Notes (Signed)
Pt informed. Arthur [name]  

## 2013-09-24 NOTE — Progress Notes (Signed)
Spoke with patient and he would like recommendations on what he can buy over the counter.  He has experience motion sickness and diarrhea before while on a cruise.  Please advise. Miraj Truss,CMA

## 2013-10-12 ENCOUNTER — Other Ambulatory Visit: Payer: Self-pay | Admitting: *Deleted

## 2013-10-12 DIAGNOSIS — I10 Essential (primary) hypertension: Secondary | ICD-10-CM

## 2013-10-12 MED ORDER — METFORMIN HCL 1000 MG PO TABS: 1000.0000 mg | ORAL_TABLET | Freq: Two times a day (BID) | ORAL | Status: DC

## 2013-10-12 MED ORDER — AMLODIPINE BESYLATE 10 MG PO TABS
10.0000 mg | ORAL_TABLET | Freq: Every day | ORAL | Status: DC
Start: 2013-10-12 — End: 2013-11-17

## 2013-10-12 MED ORDER — HYDROCHLOROTHIAZIDE 25 MG PO TABS: 25.0000 mg | ORAL_TABLET | Freq: Every day | ORAL | Status: DC

## 2013-10-12 MED ORDER — ATENOLOL 50 MG PO TABS: 50.0000 mg | ORAL_TABLET | Freq: Two times a day (BID) | ORAL | Status: DC

## 2013-10-12 MED ORDER — LOSARTAN POTASSIUM 100 MG PO TABS: 100.0000 mg | ORAL_TABLET | Freq: Every day | ORAL | Status: DC

## 2013-10-25 ENCOUNTER — Ambulatory Visit: Payer: Medicare Other | Admitting: Family Medicine

## 2013-11-17 ENCOUNTER — Encounter: Payer: Self-pay | Admitting: Family Medicine

## 2013-11-17 ENCOUNTER — Ambulatory Visit (INDEPENDENT_AMBULATORY_CARE_PROVIDER_SITE_OTHER): Payer: Medicare Other | Admitting: Family Medicine

## 2013-11-17 VITALS — BP 144/70 | HR 86 | Temp 98.4°F | Wt 195.0 lb

## 2013-11-17 DIAGNOSIS — I10 Essential (primary) hypertension: Secondary | ICD-10-CM

## 2013-11-17 MED ORDER — HYDROCHLOROTHIAZIDE 25 MG PO TABS: 25.0000 mg | ORAL_TABLET | Freq: Every day | ORAL | Status: DC

## 2013-11-17 MED ORDER — AMLODIPINE BESYLATE 10 MG PO TABS: 10.0000 mg | ORAL_TABLET | Freq: Every day | ORAL | Status: DC

## 2013-11-17 MED ORDER — METFORMIN HCL 1000 MG PO TABS: 1000.0000 mg | ORAL_TABLET | Freq: Two times a day (BID) | ORAL | Status: DC

## 2013-11-17 MED ORDER — LOSARTAN POTASSIUM 100 MG PO TABS: 100.0000 mg | ORAL_TABLET | Freq: Every day | ORAL | Status: DC

## 2013-11-17 MED ORDER — ATENOLOL 50 MG PO TABS: 50.0000 mg | ORAL_TABLET | Freq: Two times a day (BID) | ORAL | Status: DC

## 2013-11-17 NOTE — Patient Instructions (Addendum)
Good to see you today!  Thanks for coming in.  Use Afrin or neosynephrine nasal spray twice daily a for 3-4 days  Use nasal saline 5-6 times a day  Gargle with salt water several times a day  If you are not better in 10 days or if you get a fever and chest pain then come back  Come back in November for a diabetes and blood pressure check

## 2013-11-17 NOTE — Progress Notes (Signed)
   Subjective:    Patient ID: Arthur [name], male    DOB: 1940-11-28, 73 y.o.   MRN: [id number]  HPI  URI  Has been sick for 2 day with runny nose and cough dry no sputum Nasal discharge: yes Medications tried: tessalon - not helped Sick contacts: Unsure  Symptoms Fever: no Headache or face pain: no Tooth pain: no Sneezing: yes Scratchy throat: yes Allergies: no Muscle aches: mild Severe fatigue: No Stiff neck: no Shortness of breath: no Rash: no Sore throat or swollen glands: no   ROS see HPI Smoking Status noted    Review of Systems     Objective:   Physical Exam  Alert no acute distress Heart - Regular rate and rhythm.  No murmurs, gallops or rubs.    Lungs:  Normal respiratory effort, chest expands symmetrically. Lungs are clear to auscultation, no crackles or wheezes. Nose - clear discharge Mouth - no lesions, mucous membranes are moist, no decaying teeth  Throat: normal mucosa, no exudate, uvula midline, no redness Extremities:  No cyanosis, edema, or deformity noted with good range of motion of all major joints.         Assessment & Plan:   URI - no signs of pneumonia or sinustis.  Treat symptomatically

## 2013-11-24 ENCOUNTER — Telehealth: Payer: Self-pay | Admitting: Family Medicine

## 2013-11-24 MED ORDER — BENZONATATE 100 MG PO CAPS: 100.0000 mg | ORAL_CAPSULE | Freq: Two times a day (BID) | ORAL | Status: DC | PRN

## 2013-11-24 NOTE — Telephone Encounter (Signed)
Pt called and would like the cough syrup that has been called in previously. He said that he has had the cough for a while and it will not go away. Please call him when this is sent to [facility] on [address]. jw

## 2013-11-24 NOTE — Telephone Encounter (Signed)
i could not find any syrp that he had for cough so wrote for tessalon to be called in since he had before  Not sure which drugstore he wants   Please tell him if not better in 1 week i should see him  Thanks

## 2013-11-25 ENCOUNTER — Telehealth: Payer: Self-pay | Admitting: Family Medicine

## 2013-11-25 NOTE — Telephone Encounter (Signed)
Will forward to MD to see if he will approve this medication. Jazmin Hartsell,CMA

## 2013-11-25 NOTE — Telephone Encounter (Signed)
Pt called back and wanted the medication called in to the [facility] on [address]. He also wanted to know if the capsule come in a syrup form if so can we change it. Please call him when we call in so he knows to go pick it up. jw

## 2013-11-25 NOTE — Telephone Encounter (Signed)
Pt called again and would like the medication called Tussionex syrup called in to the [facility] on [address]. jw

## 2013-11-25 NOTE — Telephone Encounter (Signed)
LM for patient that medication was called into pharmacy. Jazmin Hartsell,CMA

## 2013-11-25 NOTE — Telephone Encounter (Signed)
tussinex is a narcotic and cant be called in  He would need to be seen  Sorry  Thanks  [place]

## 2013-11-26 NOTE — Telephone Encounter (Signed)
LMOVM for pt to return call .[place]eger, [name]  

## 2013-12-08 ENCOUNTER — Ambulatory Visit (INDEPENDENT_AMBULATORY_CARE_PROVIDER_SITE_OTHER): Payer: Medicare Other | Admitting: Family Medicine

## 2013-12-08 ENCOUNTER — Other Ambulatory Visit (INDEPENDENT_AMBULATORY_CARE_PROVIDER_SITE_OTHER): Payer: Medicare Other

## 2013-12-08 DIAGNOSIS — E119 Type 2 diabetes mellitus without complications: Secondary | ICD-10-CM

## 2013-12-08 LAB — POCT GLYCOSYLATED HEMOGLOBIN (HGB A1C): Hemoglobin A1C: 7.4

## 2013-12-08 NOTE — Progress Notes (Signed)
Drew A1c = 7.4%   BAJORDAN, MLS

## 2013-12-15 ENCOUNTER — Ambulatory Visit: Payer: Medicare Other | Admitting: Family Medicine

## 2013-12-22 ENCOUNTER — Ambulatory Visit (INDEPENDENT_AMBULATORY_CARE_PROVIDER_SITE_OTHER): Payer: Medicare Other | Admitting: Family Medicine

## 2013-12-22 ENCOUNTER — Encounter: Payer: Self-pay | Admitting: Family Medicine

## 2013-12-22 ENCOUNTER — Ambulatory Visit (INDEPENDENT_AMBULATORY_CARE_PROVIDER_SITE_OTHER): Payer: Medicare Other | Admitting: *Deleted

## 2013-12-22 VITALS — BP 134/62 | HR 85 | Temp 98.5°F | Ht 71.0 in | Wt 196.0 lb

## 2013-12-22 DIAGNOSIS — E785 Hyperlipidemia, unspecified: Secondary | ICD-10-CM

## 2013-12-22 DIAGNOSIS — I10 Essential (primary) hypertension: Secondary | ICD-10-CM

## 2013-12-22 DIAGNOSIS — Z23 Encounter for immunization: Secondary | ICD-10-CM

## 2013-12-22 DIAGNOSIS — E119 Type 2 diabetes mellitus without complications: Secondary | ICD-10-CM

## 2013-12-22 NOTE — Assessment & Plan Note (Signed)
At gaol continue current medication. Discussed weight control

## 2013-12-22 NOTE — Patient Instructions (Signed)
Good to see you today!  Thanks for coming in.  Take 2 40 mg pravastatin until you run out then get new prescription of 80 mg take one daily  Get an eye exam - ask your eye doctor to send me a report  Get a colonoscopy in 2016    Come back in Feb for a diabetes check  Try to lose about 5 lbs

## 2013-12-22 NOTE — Progress Notes (Signed)
   Subjective:    Patient ID: Arthur [name], male    DOB: November 23, 1940, 73 y.o.   MRN: [id number]  HPI  Cough URI - feels well and is back to normal.  Only occasional mild cough no pain or sputum  HYPERTENSION Disease Monitoring: Blood pressure range-not checking Chest pain, palpitations- no      Dyspnea- no Medications: Compliance- knows medications  Lightheadedness,Syncope- no   Edema- no  DIABETES Disease Monitoring: Blood Sugar ranges-not checking regularly Polyuria/phagia/dipsia- no      Visual problems- no Medications: Compliance- twice daily metformin Hypoglycemic symptoms- no  HYPERLIPIDEMIA Disease Monitoring: See symptoms for Hypertension Medications: Compliance- prav 40 mg daily Right upper quadrant pain- no  Muscle aches- no  Monitoring Labs and Parameters Last A1C:  Lab Results  Component Value Date   HGBA1C 7.4 12/08/2013    Last Lipid:     Component Value Date/Time   CHOL 172 06/26/2011 0917   HDL 37* 06/26/2011 0917    Last Bmet  POTASSIUM  Date Value Ref Range Status  05/27/2013 4.0 3.7 - 5.3 mEq/L Final   SODIUM  Date Value Ref Range Status  05/27/2013 136* 137 - 147 mEq/L Final   CREAT  Date Value Ref Range Status  01/05/2013 1.27 0.50 - 1.35 mg/dL Final   CREATININE, SER  Date Value Ref Range Status  05/27/2013 1.14 0.50 - 1.35 mg/dL Final      Last BPs:  BP Readings from Last 3 Encounters:  12/22/13 134/62  11/17/13 144/70  09/09/13 120/83    Chief Complaint noted Review of Symptoms - see HPI PMH - Smoking status noted.   Vital Signs reviewed   Review of Systems     Objective:   Physical Exam Alert no acute distress Heart - Regular rate and rhythm.  No murmurs, gallops or rubs.    Lungs:  Normal respiratory effort, chest expands symmetrically. Lungs are clear to auscultation, no crackles or wheezes. Extremities:  No cyanosis, edema, or deformity noted with good range of motion of all major joints.   Diabetic Foot  Check -  Appearance - no lesions, ulcers or calluses - has 6 toes on right foot  Skin - no unusual pallor or redness Monofilament testing -  Right - Great toe, medial, central, lateral ball and posterior foot intact Left - Great toe, medial, central, lateral ball and posterior foot intact        Assessment & Plan:

## 2013-12-22 NOTE — Assessment & Plan Note (Signed)
Well controlled on multiple medications

## 2013-12-22 NOTE — Assessment & Plan Note (Signed)
Not quite at goal.  Increase pravastatin

## 2014-01-04 ENCOUNTER — Encounter: Payer: Self-pay | Admitting: Family Medicine

## 2014-01-04 ENCOUNTER — Ambulatory Visit (INDEPENDENT_AMBULATORY_CARE_PROVIDER_SITE_OTHER): Payer: Medicare Other | Admitting: Family Medicine

## 2014-01-04 VITALS — BP 149/71 | HR 71 | Temp 98.4°F | Resp 18 | Wt 196.0 lb

## 2014-01-04 DIAGNOSIS — M79604 Pain in right leg: Secondary | ICD-10-CM

## 2014-01-04 DIAGNOSIS — W19XXXA Unspecified fall, initial encounter: Principal | ICD-10-CM

## 2014-01-04 DIAGNOSIS — W010XXA Fall on same level from slipping, tripping and stumbling without subsequent striking against object, initial encounter: Secondary | ICD-10-CM

## 2014-01-04 DIAGNOSIS — M79605 Pain in left leg: Secondary | ICD-10-CM

## 2014-01-04 DIAGNOSIS — IMO0001 Reserved for inherently not codable concepts without codable children: Secondary | ICD-10-CM

## 2014-01-04 NOTE — Patient Instructions (Signed)
Good to see you.  You are just bruised from your fall. Your knees do not feel unstable. I do not see any signs of bony problem.  Take ibuprofen or tylenol every 4-6 hours as needed for pain. Ibuprofen max is 2400mg  daily. Tylenol max is 3 grams daily. Stop ibuprofen (advil) if you are having stomach irritation. You should not need these regularly after 2-3 days.  Use heating pad if needed for pain relief.  Use ice if needed for pain relief.  Follow up in 1 week if not feeling better.  You can return to work as soon as you feel up to it. I will write a note for returning Monday.  Best,  [name], MD

## 2014-01-04 NOTE — Progress Notes (Signed)
Patient ID: Arthur [name], male   DOB: 24-Dec-1940, 73 y.o.   MRN: [id number] Subjective:   CC: Leg pain  HPI:   This is a 73 y.o. male here after fall yesterday. He reports tripping over a wire at work. He denies preceding chest pain, dizziness, dyspnea, or other symptoms. He has not fallen any other time this year. 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 Denies head trauma. States fall was "not forceful." Took 2 advil last night due to mild knee pain and slept well. This morning, he has developed knee, shoulder, back, upper thigh, and rib pain bilaterally that has been 4-5/10 and achy. He has been walking normally and not had any difficulty with speech or using arms or legs. Denies redness, swelling, or other concern.  Review of Systems - Per HPI.  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    Objective:  Physical Exam BP 149/71 mmHg  Pulse 71  Temp(Src) 98.4 F (36.9 C) (Oral)  Resp 18  Wt 196 lb (88.905 kg)  SpO2 97% GEN: NAD MSK: Mild tenderness bilateral arms with no erythema or swelling; mild left anterior knee tenderness with no swelling or erythema bilateral knees or legs; bilateral neg lachman's and good endpoint to valgus and varus strain; no redness or tenderness bilateral ribs; normal gait NEURO: Awake, alert, no focal deficit    Assessment:     Arthur [name] is a 73 y.o. male here for follow up after fall yesterday.    Plan:     # See problem list and after visit summary for problem-specific plans.   # Health Maintenance: Has already gotten flu shot  Follow-up: Follow up 1 week PRN for worsening pain or any swelling or difficulty with gait.   [name], MD [place]

## 2014-01-05 ENCOUNTER — Ambulatory Visit: Payer: Medicare Other | Admitting: Family Medicine

## 2014-01-05 DIAGNOSIS — R296 Repeated falls: Secondary | ICD-10-CM | POA: Insufficient documentation

## 2014-01-05 NOTE — Assessment & Plan Note (Signed)
Likely bruising from fall yesterday with no focal neuro deficit or severe pain, normal gait, and no head trauma. Cause: tripping over wire. - Ibuprofen or tylenol q4-6 hours. - Heating pad, ice pack PRN. - Work note given. - F/u 1 week PRN not improving.

## 2014-01-26 ENCOUNTER — Other Ambulatory Visit: Payer: Self-pay | Admitting: Family Medicine

## 2014-01-26 DIAGNOSIS — I10 Essential (primary) hypertension: Secondary | ICD-10-CM

## 2014-01-26 NOTE — Telephone Encounter (Signed)
Pt requesting a refill on all his meds to be sent to his mail order pharmacy, needs 3 month supply sent to [facility] at [phone]

## 2014-01-27 MED ORDER — LOSARTAN POTASSIUM 100 MG PO TABS: 100.0000 mg | ORAL_TABLET | Freq: Every day | ORAL | Status: DC

## 2014-01-27 MED ORDER — NABUMETONE 500 MG PO TABS: 500.0000 mg | ORAL_TABLET | Freq: Every day | ORAL | Status: DC | PRN

## 2014-01-27 MED ORDER — PRAVASTATIN SODIUM 40 MG PO TABS: 40.0000 mg | ORAL_TABLET | Freq: Every day | ORAL | Status: DC

## 2014-01-27 MED ORDER — ACETAMINOPHEN 325 MG PO TABS: 650.0000 mg | ORAL_TABLET | Freq: Four times a day (QID) | ORAL | Status: DC | PRN

## 2014-01-27 MED ORDER — ATENOLOL 50 MG PO TABS: 50.0000 mg | ORAL_TABLET | Freq: Two times a day (BID) | ORAL | Status: DC

## 2014-01-27 MED ORDER — HYDROCHLOROTHIAZIDE 25 MG PO TABS: 25.0000 mg | ORAL_TABLET | Freq: Every day | ORAL | Status: DC

## 2014-01-27 MED ORDER — METFORMIN HCL 1000 MG PO TABS: 1000.0000 mg | ORAL_TABLET | Freq: Two times a day (BID) | ORAL | Status: DC

## 2014-01-27 MED ORDER — OMEPRAZOLE 20 MG PO CPDR: 20.0000 mg | DELAYED_RELEASE_CAPSULE | Freq: Every day | ORAL | Status: DC | PRN

## 2014-01-27 MED ORDER — AMLODIPINE BESYLATE 10 MG PO TABS: 10.0000 mg | ORAL_TABLET | Freq: Every day | ORAL | Status: DC

## 2014-02-24 ENCOUNTER — Encounter: Payer: Self-pay | Admitting: Family Medicine

## 2014-02-24 ENCOUNTER — Ambulatory Visit (INDEPENDENT_AMBULATORY_CARE_PROVIDER_SITE_OTHER): Payer: PPO | Admitting: Family Medicine

## 2014-02-24 VITALS — BP 135/78 | HR 77 | Temp 97.8°F | Resp 18 | Wt 190.0 lb

## 2014-02-24 DIAGNOSIS — J209 Acute bronchitis, unspecified: Secondary | ICD-10-CM | POA: Diagnosis not present

## 2014-02-24 MED ORDER — DEXTROMETHORPHAN-GUAIFENESIN 20-200 MG/15ML PO SYRP: 15.0000 mL | ORAL_SOLUTION | ORAL | Status: DC | PRN

## 2014-02-24 NOTE — Assessment & Plan Note (Signed)
Currently taking doxycycline and using albuterol as needed with improvement. Requests cough syrup which was prescribed in the past by Dr. Erin Hearing. and sent to [facility] on [address]. They do not have this on their records and Dr. Erin Hearing is unavailable so will prescribe guaifenesin syrup and recommend honey. Advised that cough can linger after illness for several weeks.

## 2014-02-24 NOTE — Patient Instructions (Signed)
I could not find the Rx that you are referring to so I sent in for another cough syrup that I often prescribe. You will be able to go to work while taking it. I also recommend a tablespoon of honey as needed. If this is not helping just call the clinic and we can try something else.   - Dr. Bonner Puna

## 2014-02-24 NOTE — Progress Notes (Signed)
Subjective: Oney R Velazquez is a 74 y.o. male presenting for cough.  He is being treated with abx for bronchitis and taking albuterol as needed. Was given IM steroid injection earlier this week as well. Was given benzonatate and says these don't help at all. Requests cough syrup previously prescribed by Dr. Erin Hearing and sent to [facility] on [address]. He reports overall improvement in all symptoms but the coughing is interfering with work and sleep.   - Review of Systems: Per HPI.  - Nonsmoker Objective: BP 135/78 mmHg  Pulse 77  Temp(Src) 97.8 F (36.6 C) (Oral)  Resp 18  Wt 190 lb (86.183 kg)  SpO2 100% Gen:  74 y.o. male in no distress Pulm: Non-labored breathing ambient air; CTAB, no wheezes or crackles  Assessment/Plan: Quang R Romack is a 74 y.o. male here for cough.  1. Acute bronchitis, unspecified organism - Dextromethorphan-Guaifenesin 20-200 MG/15ML SYRP; Take 15 mLs by mouth every 4 (four) hours as needed (cough).  Dispense: 240 mL; Refill: 0

## 2014-03-03 ENCOUNTER — Telehealth: Payer: Self-pay | Admitting: Family Medicine

## 2014-03-03 NOTE — Telephone Encounter (Signed)
Needs letter showing he has a appt on Feb 24. Mail it home address

## 2014-03-04 NOTE — Telephone Encounter (Signed)
Letter mailed to patient. Lateia Fraser,CMA  

## 2014-03-15 ENCOUNTER — Ambulatory Visit (INDEPENDENT_AMBULATORY_CARE_PROVIDER_SITE_OTHER): Payer: PPO | Admitting: Family Medicine

## 2014-03-15 ENCOUNTER — Ambulatory Visit
Admission: RE | Admit: 2014-03-15 | Discharge: 2014-03-15 | Disposition: A | Discharge status: Home / Self Care | Payer: PPO | Source: Ambulatory Visit | Attending: Family Medicine | Admitting: Family Medicine

## 2014-03-15 VITALS — BP 147/79 | HR 75 | Temp 97.6°F

## 2014-03-15 DIAGNOSIS — R42 Dizziness and giddiness: Secondary | ICD-10-CM | POA: Diagnosis not present

## 2014-03-15 DIAGNOSIS — R112 Nausea with vomiting, unspecified: Secondary | ICD-10-CM

## 2014-03-15 DIAGNOSIS — R059 Cough, unspecified: Secondary | ICD-10-CM

## 2014-03-15 DIAGNOSIS — I10 Essential (primary) hypertension: Secondary | ICD-10-CM

## 2014-03-15 DIAGNOSIS — R05 Cough: Secondary | ICD-10-CM | POA: Diagnosis not present

## 2014-03-15 DIAGNOSIS — R058 Other specified cough: Secondary | ICD-10-CM | POA: Insufficient documentation

## 2014-03-15 LAB — BASIC METABOLIC PANEL
BUN: 15 mg/dL (ref 6–23)
CALCIUM: 9.3 mg/dL (ref 8.4–10.5)
CO2: 25 meq/L (ref 19–32)
CREATININE: 1.11 mg/dL (ref 0.50–1.35)
Chloride: 101 mEq/L (ref 96–112)
Glucose, Bld: 141 mg/dL — ABNORMAL HIGH (ref 70–99)
Potassium: 4.4 mEq/L (ref 3.5–5.3)
SODIUM: 137 meq/L (ref 135–145)

## 2014-03-15 LAB — GLUCOSE, CAPILLARY: GLUCOSE-CAPILLARY: 139 mg/dL — AB (ref 70–99)

## 2014-03-15 IMAGING — CR DG CHEST 2V
2 series · 2 of 2 positions shown · non-contrast
Comparison: Chest x-ray of [DATE]

CLINICAL DATA: Cough for 3 weeks, mild shortness of breath

EXAM:
CHEST  2 VIEW

[view not recorded (1 of 2)]
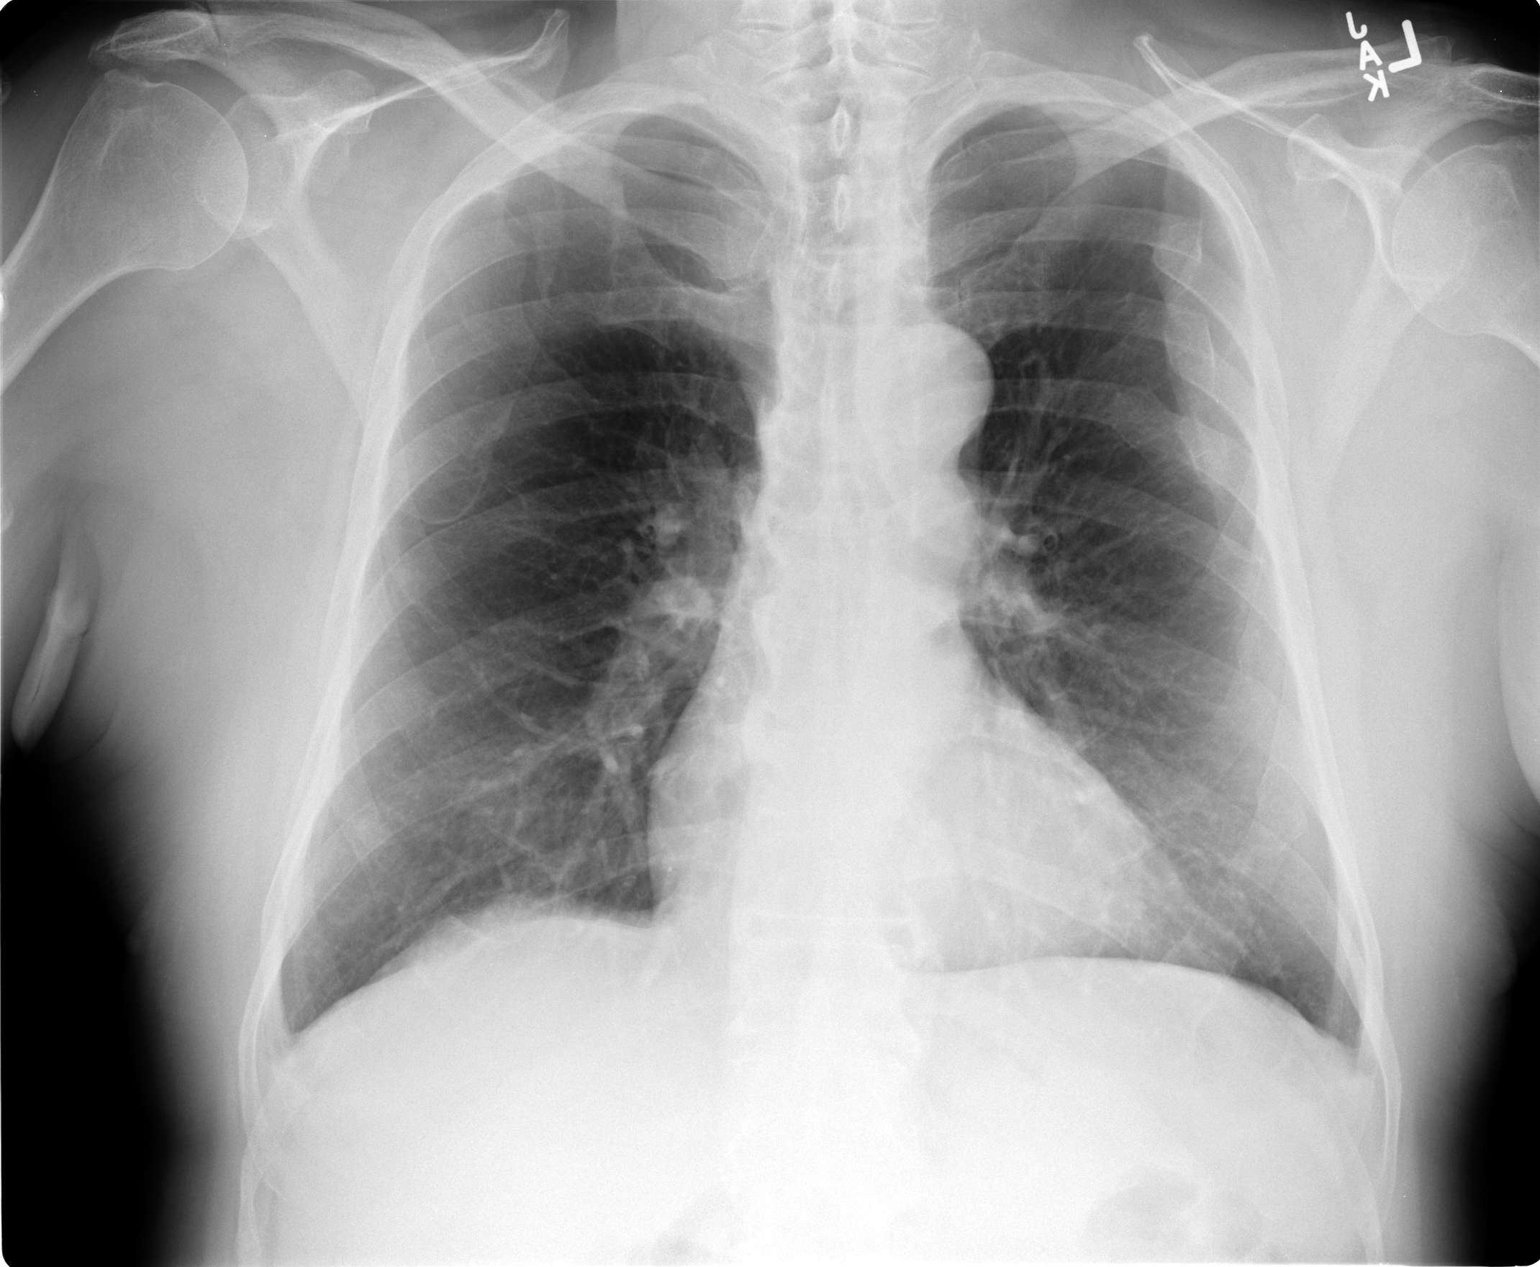

[view not recorded (2 of 2)]
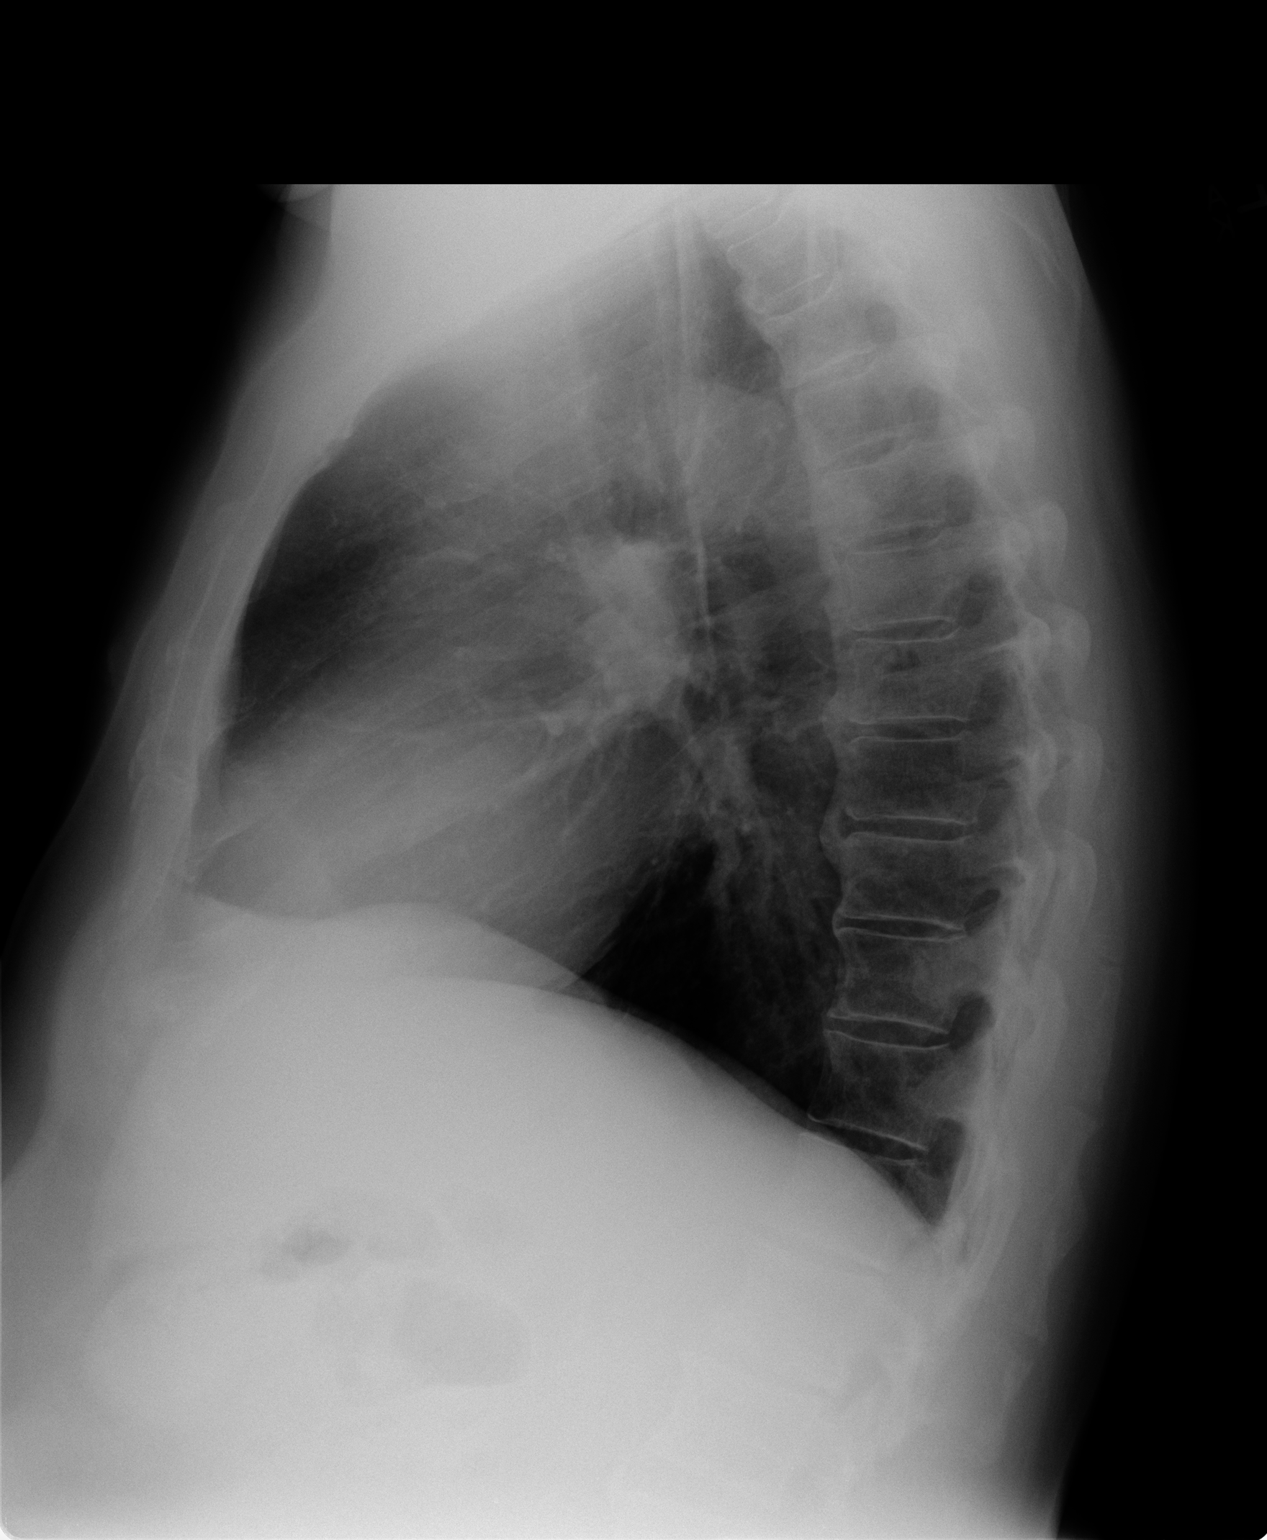

[2 of 2 positions shown; findings below may reference images not displayed]

FINDINGS: No active infiltrate or effusion is seen. Mediastinal and hilar
contours are unremarkable. The heart is within normal limits in
size. Old left posterolateral rib fractures are noted involving the
left fourth, fifth, and sixth ribs with adjacent pleural thickening
being stable. Additional old rib fractures are noted on the left as
well.
IMPRESSION: 1. No active cardiopulmonary disease.
2. Pleural thickening associated with old left rib fractures.

## 2014-03-15 MED ORDER — ONDANSETRON HCL 4 MG PO TABS
4.0000 mg | ORAL_TABLET | Freq: Once | ORAL | Status: AC
  Administered 2014-03-15: 4 mg via ORAL

## 2014-03-15 MED ORDER — MECLIZINE HCL 25 MG PO TABS: 25.0000 mg | ORAL_TABLET | Freq: Three times a day (TID) | ORAL | Status: DC | PRN

## 2014-03-15 NOTE — Assessment & Plan Note (Signed)
Sudden onset dizziness with sitting up this morning, no other concerning neurological symptoms or signs this morning on history or exam. CBG in the office in the 130s.  Better after vomiting. Consider viral gastro, BPPV (although Dix Hallpike negative). Given Zofran 4mg  dissolvable tablet in office today. Antivert as needed at home for dizziness. Will call with results of labs today (CBC, BMet) and to assess how he is doing.

## 2014-03-15 NOTE — Addendum Note (Signed)
Addended by: Willeen Niece on: 03/15/2014 12:30 PM   Modules accepted: Orders

## 2014-03-15 NOTE — Patient Instructions (Addendum)
It was a pleasure to see you today.   For the cough you have had for over two weeks, I am ordering a chest x-ray, which can be done as a walk-in basis.  We will give you a map of the location of the radiology site.   Regarding the sudden onset of dizziness with vomiting this morning, I am checking some blood work (metabolic panel, blood count), and I will call with the results tomorrow [phone].  If you have recurrent dizziness, you may want to check your fingerstick blood sugar to be sure it is not low.  Try to sip on liquids slowly throughout the day.  I sent in a medicine for dizziness, meclizine 25mg , which can be taken one tablet by mouth every 8 hours as needed for dizziness. It can also make you sleepy, so be mindful of this.  We can discuss any further workup or treatment as needed, based on the results of today's orders.

## 2014-03-15 NOTE — Progress Notes (Signed)
   Subjective:    Patient ID: Arthur [name], male    DOB: January 31, 1940, 74 y.o.   MRN: [id number]  HPI Patient here for SDA visit for sudden-onset dizziness that began when he sat up in bed this morning around 7am.  Felt dizzy (makes motion of spinning aroudn head with hand), which abated after he laid back down.  Tried to sit up several times with same result.  Patient reports he has had a persistent cough productive of white/yellow sputum for over 2 weeks, began with a coughing paroxysm this mornign and promptly vomited.  He had his usual breakfast of bread with tea and his morning medicines, reports that he did not have nausea this morning or last night.  He had a typical formed, non-bloody bowel movement this morning as is his custom.  This morning after arriving at the [facility] he began to have more dizziness while being brought to a room.  Began with cough paroxysm and began to have heavy emesis.  Felt better after vomiting, reports resolution of the dizziness as well. He denies nausea/vomiting yesterday, denies fevers or chills, denies urinary symptoms. Denies focal weakness or visual changes.  Has had some sinus congestion recently with watery rhinorrhea, no known sick contacts.  Denies chest pain or dyspnea with the cough. Took some Tussinex which made him dizzy after an [facility] visit on Jan 28th.   Seen at [facility] on 1/24 and diagnosed with sinus congestion, given SoluMedrol 125mg  IM at that time.   Social Hx; Never-smoker. Vegetarian [place] diet.  No alcohol.  Ambulates independently, without assistive devices at baseline.   Surgical Hx No abdominal surgeries.   Review of Systems See above     Objective:   Physical Exam Generally well appearing, no apparent distress HEENT Neck supple. No nystagmus noted. TMs clear. Nasal mucosa with slight watery discharge. No frontal/maxillary sinus tenderness. Neck supple without adenopathy. Clear oropharynx. Marye Round testing does not elicit dizziness or  provoke nystagmus.  COR Regular S1S2 PULM Clear bilaterally, no rales or wheezes ABD Soft, nontender, nondistended. Audible bowel sounds heard on auscultation.  EXTS No lower ext edema. NEURO Patietn is able to ambulate independently. Shoulder shrug symmetric, handgrip symmetric and full; tongue in midline. Able to maintain eyelids tightly closed bilat. Forehead wrinkles symmetrically. DTRs (patellar) 1+ symmetric bilat. LE strength 5/5 full symmetric bilaterally.  Dorsi/plantar flexion symmetric and full bilaterally.        Assessment & Plan:

## 2014-03-15 NOTE — Addendum Note (Signed)
Addended by: Junious Dresser on: 03/15/2014 12:24 PM   Modules accepted: Orders

## 2014-03-15 NOTE — Assessment & Plan Note (Signed)
Productive cough with post-tussive emesis. Suspect possible atypical pneumonia, XR to rule out lobar pneumonia, given duration of cough for over two weeks. Vaccinated against flu and pneumovax.

## 2014-03-16 ENCOUNTER — Telehealth: Payer: Self-pay | Admitting: Family Medicine

## 2014-03-16 LAB — CBC
HCT: 38.7 % — ABNORMAL LOW (ref 39.0–52.0)
HEMOGLOBIN: 12.1 g/dL — AB (ref 13.0–17.0)
MCH: 24.6 pg — ABNORMAL LOW (ref 26.0–34.0)
MCHC: 31.3 g/dL (ref 30.0–36.0)
MCV: 78.8 fL (ref 78.0–100.0)
MPV: 8.9 fL (ref 8.6–12.4)
Platelets: 392 10*3/uL (ref 150–400)
RBC: 4.91 MIL/uL (ref 4.22–5.81)
RDW: 17.3 % — ABNORMAL HIGH (ref 11.5–15.5)
WBC: 9.2 10*3/uL (ref 4.0–10.5)

## 2014-03-16 NOTE — Telephone Encounter (Signed)
Called patient at home to report results of CXR and laboratory studies.  No evidence of lobar pneumonia; CBC and metabolic panel consistent with prior.  This morning he states he continues with intermittent dizziness that comes on with position change (mostly laying to sitting/standing); no tinnitus or hearing loss. No emesis since yesterday. His cough is improved since yesterday as well. I suspect peripheral cause of vertiginous symptoms (likely BPPV); recommend he try meclizine as we discussed yesterday (has not tried yet).  Note for work to remain out of work until his next appointment with Dr Erin Hearing next week. Discussed changes that should prompt more emergent evaluation, including a review of stroke symptoms that should prompt 9-1-1 call rather than self-transport.  I emphasized that I do not believe there is a central cause to his symptoms, which overall are milder today than at evaluation yesterday.   Letter for patient's work to be mailed to his house. Written and routed to [employer] Team.  Dalbert Mayotte, MD

## 2014-03-16 NOTE — Telephone Encounter (Signed)
Mailed to pt. Deseree Kennon Holter, CMA

## 2014-03-23 ENCOUNTER — Ambulatory Visit (INDEPENDENT_AMBULATORY_CARE_PROVIDER_SITE_OTHER): Payer: PPO | Admitting: Family Medicine

## 2014-03-23 ENCOUNTER — Encounter: Payer: Self-pay | Admitting: Family Medicine

## 2014-03-23 VITALS — BP 134/72 | HR 81 | Temp 98.7°F | Wt 190.3 lb

## 2014-03-23 DIAGNOSIS — R05 Cough: Secondary | ICD-10-CM | POA: Diagnosis not present

## 2014-03-23 DIAGNOSIS — E119 Type 2 diabetes mellitus without complications: Secondary | ICD-10-CM

## 2014-03-23 DIAGNOSIS — R42 Dizziness and giddiness: Secondary | ICD-10-CM | POA: Diagnosis not present

## 2014-03-23 DIAGNOSIS — I10 Essential (primary) hypertension: Secondary | ICD-10-CM

## 2014-03-23 DIAGNOSIS — R059 Cough, unspecified: Secondary | ICD-10-CM

## 2014-03-23 LAB — POCT GLYCOSYLATED HEMOGLOBIN (HGB A1C): HEMOGLOBIN A1C: 7.1

## 2014-03-23 MED ORDER — BENZONATATE 100 MG PO CAPS: 100.0000 mg | ORAL_CAPSULE | Freq: Two times a day (BID) | ORAL | Status: DC | PRN

## 2014-03-23 NOTE — Progress Notes (Signed)
   Subjective:    Patient ID: Arthur [name], male    DOB: Jun 03, 1940, 74 y.o.   MRN: [id number]  HPI  Vertigo and Nausea vomiting  Resolved has had none since last visit.  No abdomen pain.  Took meclizine once and has not taken again  Cough Has has several "colds" this year with prolonged cough.  Did seem to recover back to normal between them but still has a mild cough since last one.  No fever or chills or sputum or shortness of breath Chest xray last week was normal  HYPERTENSION Disease Monitoring Home BP Monitoring not checking Chest pain- no    Dyspnea- no Medications Compliance-  daily. Lightheadedness-  no  Edema- no ROS - See HPI  PMH Lab Review   POTASSIUM  Date Value Ref Range Status  03/15/2014 4.4 3.5 - 5.3 mEq/L Final   SODIUM  Date Value Ref Range Status  03/15/2014 137 135 - 145 mEq/L Final   CREAT  Date Value Ref Range Status  03/15/2014 1.11 0.50 - 1.35 mg/dL Final   CREATININE, SER  Date Value Ref Range Status  05/27/2013 1.14 0.50 - 1.35 mg/dL Final           DIABETES Disease Monitoring: Blood Sugar ranges-not checking Polyuria/phagia/dipsia- no      Visual problems- no Medications: Compliance- every day Hypoglycemic symptoms- none  Chief Complaint noted Review of Symptoms - see HPI PMH - Smoking status noted.   Vital Signs reviewed    Review of Systems     Objective:   Physical Exam  Alert no acute distress Heart - Regular rate and rhythm.  No murmurs, gallops or rubs.    Lungs:  Normal respiratory effort, chest expands symmetrically. Lungs are clear to auscultation, no crackles or wheezes. Extremities:  No cyanosis, edema, or deformity noted with good range of motion of all major joints.         Assessment & Plan:

## 2014-03-23 NOTE — Patient Instructions (Addendum)
Good to see you today!  Thanks for coming in.  All your blood tests and xray looks good  Your diabetes and blood pressure are well controlled  I would take a multivitamin once a day - like Centrum Silver - generic  Regular exercise  If you have chest pain or shortness of breath or bad cough comes back call me  Other wise come back in 3 months for blood pressure and diabetes and weight check

## 2014-03-23 NOTE — Assessment & Plan Note (Signed)
Essentially resolved.  Wrote an rx for [employer] for him to use as needed.  Chest xray reassuring for any chronic disease

## 2014-03-23 NOTE — Assessment & Plan Note (Signed)
Well controlled 

## 2014-03-23 NOTE — Assessment & Plan Note (Signed)
resolved 

## 2014-08-03 ENCOUNTER — Encounter: Payer: Self-pay | Admitting: Family Medicine

## 2014-08-03 ENCOUNTER — Ambulatory Visit (INDEPENDENT_AMBULATORY_CARE_PROVIDER_SITE_OTHER): Payer: PPO | Admitting: Family Medicine

## 2014-08-03 VITALS — BP 143/66 | HR 73 | Temp 98.3°F | Ht 71.0 in | Wt 189.0 lb

## 2014-08-03 DIAGNOSIS — I10 Essential (primary) hypertension: Secondary | ICD-10-CM

## 2014-08-03 DIAGNOSIS — E119 Type 2 diabetes mellitus without complications: Secondary | ICD-10-CM | POA: Diagnosis not present

## 2014-08-03 DIAGNOSIS — E785 Hyperlipidemia, unspecified: Secondary | ICD-10-CM | POA: Diagnosis not present

## 2014-08-03 DIAGNOSIS — M153 Secondary multiple arthritis: Secondary | ICD-10-CM

## 2014-08-03 LAB — POCT GLYCOSYLATED HEMOGLOBIN (HGB A1C): Hemoglobin A1C: 6.9

## 2014-08-03 MED ORDER — OMEPRAZOLE 20 MG PO CPDR: 20.0000 mg | DELAYED_RELEASE_CAPSULE | Freq: Every day | ORAL | Status: DC | PRN

## 2014-08-03 MED ORDER — LOSARTAN POTASSIUM 100 MG PO TABS: 100.0000 mg | ORAL_TABLET | Freq: Every day | ORAL | Status: DC

## 2014-08-03 MED ORDER — HYDROCHLOROTHIAZIDE 25 MG PO TABS: 25.0000 mg | ORAL_TABLET | Freq: Every day | ORAL | Status: DC

## 2014-08-03 MED ORDER — METFORMIN HCL 1000 MG PO TABS: 1000.0000 mg | ORAL_TABLET | Freq: Two times a day (BID) | ORAL | Status: DC

## 2014-08-03 MED ORDER — ATENOLOL 50 MG PO TABS: 50.0000 mg | ORAL_TABLET | Freq: Two times a day (BID) | ORAL | Status: DC

## 2014-08-03 MED ORDER — PRAVASTATIN SODIUM 40 MG PO TABS: 40.0000 mg | ORAL_TABLET | Freq: Every day | ORAL | Status: DC

## 2014-08-03 MED ORDER — AMLODIPINE BESYLATE 10 MG PO TABS: 10.0000 mg | ORAL_TABLET | Freq: Every day | ORAL | Status: DC

## 2014-08-03 NOTE — Assessment & Plan Note (Signed)
Likely of shoulder.  Seems mild try otc medications and tylenol

## 2014-08-03 NOTE — Assessment & Plan Note (Signed)
Stable continue statin

## 2014-08-03 NOTE — Assessment & Plan Note (Addendum)
BP Readings from Last 3 Encounters:  08/03/14 143/66  03/23/14 134/72  03/15/14 147/79    Well controlled on 4 medications

## 2014-08-03 NOTE — Assessment & Plan Note (Signed)
Well controlled 

## 2014-08-03 NOTE — Patient Instructions (Signed)
Good to see you today!  Thanks for coming in.  Your diabetes is doing very well   Your blood pressure is well  Work on losing weight.  Try to get to 180 lbs   Use your creme on your shoulder for the arthritis and then acetominophen.    Call and make an appt for a colonoscopy   Come back in 3 months

## 2014-08-03 NOTE — Progress Notes (Signed)
   Subjective:    Patient ID: KOSEI RHODES, male    DOB: 07/11/1940, 74 y.o.   MRN: [id number]  HPI  Shoulder Pain - right shoulder hurts him occasionally.  No weakness or change in sensation.  Has old nsaid at home not  Helping.  Mainly only when lifting.  No other joint pain  HYPERTENSION Disease Monitoring: Blood pressure range-not checking Chest pain, palpitations- no      Dyspnea- no Medications: Compliance- daily Lightheadedness,Syncope- no   Edema- no  DIABETES Disease Monitoring: Blood Sugar ranges-not checking Polyuria/phagia/dipsia- no      Visual problems- no Medications: Compliance- daily Hypoglycemic symptoms- no  HYPERLIPIDEMIA Disease Monitoring: See symptoms for Hypertension Medications: Compliance- daily Right upper quadrant pain- no  Muscle aches- no  Monitoring Labs and Parameters Last A1C:  Lab Results  Component Value Date   HGBA1C 6.9 08/03/2014    Last Lipid:     Component Value Date/Time   CHOL 172 06/26/2011 0917   HDL 37* 06/26/2011 0917    Last Bmet  POTASSIUM  Date Value Ref Range Status  03/15/2014 4.4 3.5 - 5.3 mEq/L Final   SODIUM  Date Value Ref Range Status  03/15/2014 137 135 - 145 mEq/L Final   CREAT  Date Value Ref Range Status  03/15/2014 1.11 0.50 - 1.35 mg/dL Final   CREATININE, SER  Date Value Ref Range Status  05/27/2013 1.14 0.50 - 1.35 mg/dL Final      Last BPs:  BP Readings from Last 3 Encounters:  08/03/14 143/66  03/23/14 134/72  03/15/14 147/79    Chief Complaint noted Review of Symptoms - see HPI PMH - Smoking status noted.   Vital Signs reviewed     Review of Systems     Objective:   Physical Exam  Alert no acute distress R shoulder - FROM without pain.  No empty can weakness.  Distal strength and sensatino and pulses normal       Assessment & Plan:

## 2014-09-27 ENCOUNTER — Telehealth: Payer: Self-pay | Admitting: Family Medicine

## 2014-09-27 ENCOUNTER — Other Ambulatory Visit: Payer: Self-pay | Admitting: Family Medicine

## 2014-09-27 MED ORDER — NABUMETONE 500 MG PO TABS: 500.0000 mg | ORAL_TABLET | Freq: Two times a day (BID) | ORAL | Status: DC | PRN

## 2014-09-27 NOTE — Telephone Encounter (Signed)
Will forward to MD.  Patient doesn't have this medication on his current list and was last written 12/2013.  Jazmin Hartsell,CMA

## 2014-09-27 NOTE — Telephone Encounter (Signed)
Needs refill on nabumetone. He is experiencing pain in his knee again Uses [facility] order pharmacy

## 2014-09-27 NOTE — Telephone Encounter (Signed)
Ordered for him  He should only take prn and not regularly

## 2014-09-29 ENCOUNTER — Telehealth: Payer: Self-pay | Admitting: Family Medicine

## 2014-09-29 NOTE — Telephone Encounter (Signed)
Will forward to MD. Jazmin Hartsell,CMA  

## 2014-09-29 NOTE — Telephone Encounter (Signed)
Hope from [facility] is calling to request a QTY change on the pt's rx, Relafen, to 180 (qty) for 90 days. Sadie Reynolds, ASA

## 2014-09-30 NOTE — Telephone Encounter (Signed)
This medication is prn not to be taken twice daily regularly so do not want to Rx 180  Thanks  LC

## 2014-09-30 NOTE — Telephone Encounter (Signed)
I spoke with Hope at the pharmacy and she states that if patient ever has to take the medication for the 2nd time in a day and possibly again that he would be out of medication before the 90 days are up.  She is requesting the quantity change just as a back up so he doesn't run out before the 3 months are up.  Tullio Chausse,CMA

## 2014-10-04 ENCOUNTER — Telehealth: Payer: Self-pay | Admitting: *Deleted

## 2014-10-04 NOTE — Telephone Encounter (Signed)
[facility] call to verify the quantity of Relafen.  Provider ordered #90, per Dr. Erin Hearing the #90 was ordered due patient not take medication everyday. #90 is patient's 90 day supply.  Derl Barrow, RN

## 2014-10-05 ENCOUNTER — Encounter: Payer: Self-pay | Admitting: Family Medicine

## 2014-10-05 ENCOUNTER — Ambulatory Visit (INDEPENDENT_AMBULATORY_CARE_PROVIDER_SITE_OTHER): Payer: PPO | Admitting: Family Medicine

## 2014-10-05 VITALS — BP 130/63 | HR 72 | Temp 98.5°F | Ht 71.0 in | Wt 190.1 lb

## 2014-10-05 DIAGNOSIS — Z23 Encounter for immunization: Secondary | ICD-10-CM | POA: Diagnosis not present

## 2014-10-05 DIAGNOSIS — Z7189 Other specified counseling: Secondary | ICD-10-CM | POA: Diagnosis not present

## 2014-10-05 DIAGNOSIS — E785 Hyperlipidemia, unspecified: Secondary | ICD-10-CM

## 2014-10-05 DIAGNOSIS — I10 Essential (primary) hypertension: Secondary | ICD-10-CM

## 2014-10-05 DIAGNOSIS — Z7184 Encounter for health counseling related to travel: Secondary | ICD-10-CM

## 2014-10-05 MED ORDER — BENZONATATE 100 MG PO CAPS: 100.0000 mg | ORAL_CAPSULE | Freq: Two times a day (BID) | ORAL | Status: DC | PRN

## 2014-10-05 NOTE — Patient Instructions (Addendum)
Good to see you today!  Thanks for coming in.  Please see your Eye doctor and ask him to send me a letter  Call your insurance to get your Shingles Shot  Come back in mid October  I will contact you about medications for [place]

## 2014-10-06 NOTE — Assessment & Plan Note (Signed)
Stable   Plan to change to lipitor when he returns from [place]

## 2014-10-06 NOTE — Assessment & Plan Note (Signed)
Stable at goal  BP Readings from Last 3 Encounters:  10/05/14 130/63  08/03/14 143/66  03/23/14 134/72

## 2014-10-06 NOTE — Progress Notes (Signed)
   Subjective:    Patient ID: Arthur [name], male    DOB: 12-13-1940, 75 y.o.   MRN: [id number]  HPI  HYPERTENSION Disease Monitoring Home BP Monitoring not checking but interested in a monitor Chest pain- no    Dyspnea- no Medications Compliance-  Has all his meds. Lightheadedness-  no  Edema- no ROS - See HPI  PMH Lab Review   POTASSIUM  Date Value Ref Range Status  03/15/2014 4.4 3.5 - 5.3 mEq/L Final   SODIUM  Date Value Ref Range Status  03/15/2014 137 135 - 145 mEq/L Final   CREAT  Date Value Ref Range Status  03/15/2014 1.11 0.50 - 1.35 mg/dL Final   CREATININE, SER  Date Value Ref Range Status  05/27/2013 1.14 0.50 - 1.35 mg/dL Final      HYPERLIPIDEMIA Symptoms Chest pain on exertion:  no   Leg claudication:   no Medications: Compliance- daily simva Right upper quadrant pain- no  Muscle aches- no     Component Value Date/Time   CHOL 172 06/26/2011 0917   TRIG 169* 06/26/2011 0917   HDL 37* 06/26/2011 0917   VLDL 34 06/26/2011 0917   CHOLHDL 4.6 06/26/2011 0917    Travel Will be travaeling to [place] in November.  Interested in immunizations and prophylaxis  Chief Complaint noted Review of Symptoms - see HPI PMH - Smoking status noted.   Vital Signs reviewed    Review of Systems     Objective:   Physical Exam Alert nad        Assessment & Plan:

## 2014-10-21 ENCOUNTER — Encounter: Payer: PPO | Admitting: Internal Medicine

## 2014-10-25 ENCOUNTER — Other Ambulatory Visit: Payer: Self-pay | Admitting: *Deleted

## 2014-10-25 MED ORDER — PRAVASTATIN SODIUM 40 MG PO TABS: 40.0000 mg | ORAL_TABLET | Freq: Every day | ORAL | Status: DC

## 2014-10-25 MED ORDER — AMLODIPINE BESYLATE 10 MG PO TABS: 10.0000 mg | ORAL_TABLET | Freq: Every day | ORAL | Status: DC

## 2014-10-25 MED ORDER — ACETAMINOPHEN 325 MG PO TABS: 650.0000 mg | ORAL_TABLET | Freq: Four times a day (QID) | ORAL | Status: DC | PRN

## 2014-10-25 MED ORDER — LOSARTAN POTASSIUM 100 MG PO TABS: 100.0000 mg | ORAL_TABLET | Freq: Every day | ORAL | Status: DC

## 2014-10-25 MED ORDER — HYDROCHLOROTHIAZIDE 25 MG PO TABS: 25.0000 mg | ORAL_TABLET | Freq: Every day | ORAL | Status: DC

## 2014-10-25 MED ORDER — ATENOLOL 50 MG PO TABS: 50.0000 mg | ORAL_TABLET | Freq: Two times a day (BID) | ORAL | Status: DC

## 2014-10-25 MED ORDER — NABUMETONE 500 MG PO TABS: 500.0000 mg | ORAL_TABLET | Freq: Two times a day (BID) | ORAL | Status: DC | PRN

## 2014-10-25 MED ORDER — OMEPRAZOLE 20 MG PO CPDR: 20.0000 mg | DELAYED_RELEASE_CAPSULE | Freq: Every day | ORAL | Status: DC | PRN

## 2014-10-25 MED ORDER — METFORMIN HCL 1000 MG PO TABS: ORAL_TABLET | ORAL | Status: DC

## 2014-10-25 NOTE — Telephone Encounter (Signed)
Per Dr. Erin Hearing ok to refill all meds early (pt was seen on 10/05/14 in clinic with PCP) as pt is going out of town to [place] for 3 mths and needed ok to refill early. Sent all meds to [facility] order pharmacy with note stating ok to refill early per Dr. Erin Hearing. Adams,Latoya, CMA.

## 2014-11-09 ENCOUNTER — Telehealth: Payer: Self-pay | Admitting: Family Medicine

## 2014-11-09 ENCOUNTER — Ambulatory Visit (INDEPENDENT_AMBULATORY_CARE_PROVIDER_SITE_OTHER): Payer: PPO | Admitting: Family Medicine

## 2014-11-09 ENCOUNTER — Encounter: Payer: Self-pay | Admitting: Family Medicine

## 2014-11-09 VITALS — BP 116/64 | HR 74 | Temp 98.2°F | Ht 71.0 in | Wt 188.0 lb

## 2014-11-09 DIAGNOSIS — Z23 Encounter for immunization: Secondary | ICD-10-CM

## 2014-11-09 DIAGNOSIS — E785 Hyperlipidemia, unspecified: Secondary | ICD-10-CM

## 2014-11-09 DIAGNOSIS — E119 Type 2 diabetes mellitus without complications: Secondary | ICD-10-CM | POA: Diagnosis not present

## 2014-11-09 DIAGNOSIS — I10 Essential (primary) hypertension: Secondary | ICD-10-CM | POA: Diagnosis not present

## 2014-11-09 LAB — POCT GLYCOSYLATED HEMOGLOBIN (HGB A1C): HEMOGLOBIN A1C: 6.9

## 2014-11-09 NOTE — Progress Notes (Signed)
   Subjective:    Patient ID: Arthur [name], male    DOB: 29-Aug-1940, 74 y.o.   MRN: [id number]  HPI  HYPERTENSION Disease Monitoring: Blood pressure range-not checking  Chest pain, palpitations- no      Dyspnea- no Medications: Compliance- knows meds Lightheadedness,Syncope- no   Edema- no  DIABETES Disease Monitoring: Blood Sugar ranges-not checking Polyuria/phagia/dipsia- no      Visual problems- no Medications: Compliance- knows meds Hypoglycemic symptoms- no  HYPERLIPIDEMIA Disease Monitoring: See symptoms for Hypertension Medications: Compliance- daily pravastatin Right upper quadrant pain- no  Muscle aches- no  Left KNEE Pain For last week or so.  No injury or overuse.  Focal pain on upper medial knee.   No swelling or redness or fever  Monitoring Labs and Parameters Last A1C:  Lab Results  Component Value Date   HGBA1C 6.9 11/09/2014    Last Lipid:     Component Value Date/Time   CHOL 172 06/26/2011 0917   HDL 37* 06/26/2011 0917    Last Bmet  POTASSIUM  Date Value Ref Range Status  03/15/2014 4.4 3.5 - 5.3 mEq/L Final   SODIUM  Date Value Ref Range Status  03/15/2014 137 135 - 145 mEq/L Final   CREAT  Date Value Ref Range Status  03/15/2014 1.11 0.50 - 1.35 mg/dL Final   CREATININE, SER  Date Value Ref Range Status  05/27/2013 1.14 0.50 - 1.35 mg/dL Final      Last BPs:  BP Readings from Last 3 Encounters:  11/09/14 116/64  10/05/14 130/63  08/03/14 143/66    Chief Complaint noted Review of Symptoms - see HPI PMH - Smoking status noted.   Vital Signs reviewed     Review of Systems     Objective:   Physical Exam  Alert nad Heart - Regular rate and rhythm.  No murmurs, gallops or rubs.    Left Knee - FROM.  No effusion. Focal tenderness medial distal femur.  Pain worsened with extension resisted No laxity       Assessment & Plan:   Left Knee Pain Likely overuse with tendinitis.  No signs of intra-articular disease.   Symptomatic treatment

## 2014-11-09 NOTE — Patient Instructions (Signed)
Good to see you today!  Thanks for coming in.  Use Aspercreme three times daily for your knee  Your diabetes is doing well  I will contact you about [place] medication  Come back in Jan - Feb  Get your colonscopy after you return

## 2014-11-09 NOTE — Assessment & Plan Note (Signed)
Stable.. Consider change to lipitor once back from [place] trip

## 2014-11-09 NOTE — Assessment & Plan Note (Signed)
Well controlled.  No signs of complications

## 2014-11-09 NOTE — Telephone Encounter (Signed)
Spoke with [place] with pharmacy and she states that they have put a request in with the insurance for an extra 90day supply and explained the patient's situation.  They will go ahead and ship this if it approved and if not then they will plan to ship patient an extra 30 days.  I did explain to him what was going on while he was in clinic and he voiced understanding and will just be glad to have more medication to last him during his trip. Riot Waterworth,CMA

## 2014-11-09 NOTE — Assessment & Plan Note (Signed)
BP Readings from Last 3 Encounters:  11/09/14 116/64  10/05/14 130/63  08/03/14 143/66    Good control

## 2014-11-09 NOTE — Telephone Encounter (Signed)
[facility]: Insurance will only allow 30 day refill but is trying to get a 90 day exception because he is going out of town. No answer yet from insurance company

## 2014-11-10 ENCOUNTER — Telehealth: Payer: Self-pay | Admitting: Family Medicine

## 2014-11-14 NOTE — Telephone Encounter (Signed)
Called told him to call the [facility] travel [place] at [phone] ask for Maudie Mercury get an apt as he will likely need immunizations that we can not give  He agrees  Asked to call back if he has any problems being seen

## 2015-03-15 ENCOUNTER — Ambulatory Visit: Payer: PPO | Admitting: Family Medicine

## 2015-03-20 ENCOUNTER — Encounter: Payer: Self-pay | Admitting: Family Medicine

## 2015-03-20 ENCOUNTER — Ambulatory Visit (INDEPENDENT_AMBULATORY_CARE_PROVIDER_SITE_OTHER): Payer: PPO | Admitting: Family Medicine

## 2015-03-20 VITALS — BP 131/66 | HR 76 | Temp 98.9°F | Ht 71.0 in | Wt 187.0 lb

## 2015-03-20 DIAGNOSIS — E1169 Type 2 diabetes mellitus with other specified complication: Secondary | ICD-10-CM | POA: Diagnosis not present

## 2015-03-20 DIAGNOSIS — I10 Essential (primary) hypertension: Secondary | ICD-10-CM | POA: Diagnosis not present

## 2015-03-20 DIAGNOSIS — E785 Hyperlipidemia, unspecified: Secondary | ICD-10-CM

## 2015-03-20 LAB — POCT GLYCOSYLATED HEMOGLOBIN (HGB A1C): Hemoglobin A1C: 6.8

## 2015-03-20 MED ORDER — BENZONATATE 100 MG PO CAPS: 100.0000 mg | ORAL_CAPSULE | Freq: Two times a day (BID) | ORAL | Status: DC | PRN

## 2015-03-20 NOTE — Patient Instructions (Signed)
Good to see you today!  Thanks for coming in.  Your diabetes and your blood pressure is very good  We will check your blood tests - I will call you if your tests are not good.  Otherwise I will send you a letter.  If you do not hear from me with in 2 weeks please call our office.     Send in the stool cards to check for colon cancer  Use the tessalon for cough - If it is not better in 2 weeks then come back  Come back in 6 months for a regular diabetes check

## 2015-03-20 NOTE — Assessment & Plan Note (Signed)
Well controlled check labs

## 2015-03-20 NOTE — Progress Notes (Signed)
   Subjective:    Patient ID: Arthur [name], male    DOB: Jun 10, 1940, 75 y.o.   MRN: [id number]  HPI  Pain right foot - thinks may have stepped wrong.  Over mid foot.  Mild soft tissue swelling able to walk without much pain.  Taking advil which helps  Cough - dry for last few days.  No fever or sputum or shortness of breath or rash.  HYPERTENSION Disease Monitoring: Blood pressure range-not chcking Chest pain, palpitations- no      Dyspnea- no Medications: Compliance- brought all medications and knows them Lightheadedness,Syncope- no   Edema- no  DIABETES Disease Monitoring: Blood Sugar ranges-not checking Polyuria/phagia/dipsia- no      Visual problems- no Medications: Compliance- good Hypoglycemic symptoms- no  HYPERLIPIDEMIA Disease Monitoring: See symptoms for Hypertension Medications: Compliance- daily pravastatin Right upper quadrant pain- no  Muscle aches- no  Monitoring Labs and Parameters Last A1C:  Lab Results  Component Value Date   HGBA1C 6.8 03/20/2015    Last Lipid:     Component Value Date/Time   CHOL 172 06/26/2011 0917   HDL 37* 06/26/2011 0917    Last Bmet  POTASSIUM  Date Value Ref Range Status  03/15/2014 4.4 3.5 - 5.3 mEq/L Final   SODIUM  Date Value Ref Range Status  03/15/2014 137 135 - 145 mEq/L Final   CREAT  Date Value Ref Range Status  03/15/2014 1.11 0.50 - 1.35 mg/dL Final   CREATININE, SER  Date Value Ref Range Status  05/27/2013 1.14 0.50 - 1.35 mg/dL Final      Last BPs:  BP Readings from Last 3 Encounters:  03/20/15 131/66  11/09/14 116/64  10/05/14 130/63    Chief Complaint noted Review of Symptoms - see HPI PMH - Smoking status noted.   Vital Signs reviewed     Review of Systems     Objective:   Physical Exam  Healthy looking Heart - Regular rate and rhythm.  No murmurs, gallops or rubs.    Lungs:  Normal respiratory effort, chest expands symmetrically. Lungs are clear to auscultation, no crackles  or wheezes. Extremities:  No cyanosis, edema, or deformity noted with good range of motion of all major joints.   R foot - mild soft tissue swelling mid foot with slight tendernes - no deformity or redness small amount of brusing no toe pain with range of motion No calf tenderness       Assessment & Plan:   Foot pain - seems to be mild mid foot sprain.  No signs of fracture or infection will monitor  Cough - seems to be mainly URI.  No signs of bacterial infection or chf  trial of tessalon

## 2015-03-20 NOTE — Assessment & Plan Note (Signed)
Check labs 

## 2015-03-20 NOTE — Assessment & Plan Note (Signed)
Well controlled 

## 2015-03-21 ENCOUNTER — Encounter: Payer: Self-pay | Admitting: Family Medicine

## 2015-03-21 LAB — LIPID PANEL
CHOL/HDL RATIO: 3.8 ratio (ref <=5.0)
CHOLESTEROL: 163 mg/dL (ref 125–200)
HDL: 43 mg/dL (ref >=40)
LDL Cholesterol: 90 mg/dL (ref <130)
TRIGLYCERIDES: 152 mg/dL — AB (ref <150)
VLDL: 30 mg/dL (ref <30)

## 2015-03-21 LAB — COMPREHENSIVE METABOLIC PANEL
ALK PHOS: 65 U/L (ref 40–115)
ALT: 8 U/L — ABNORMAL LOW (ref 9–46)
AST: 11 U/L (ref 10–35)
Albumin: 4 g/dL (ref 3.6–5.1)
BILIRUBIN TOTAL: 0.6 mg/dL (ref 0.2–1.2)
BUN: 18 mg/dL (ref 7–25)
CALCIUM: 9.5 mg/dL (ref 8.6–10.3)
CO2: 25 mmol/L (ref 20–31)
Chloride: 99 mmol/L (ref 98–110)
Creat: 1.23 mg/dL — ABNORMAL HIGH (ref 0.70–1.18)
GLUCOSE: 137 mg/dL — AB (ref 65–99)
POTASSIUM: 4.3 mmol/L (ref 3.5–5.3)
SODIUM: 136 mmol/L (ref 135–146)
Total Protein: 7.6 g/dL (ref 6.1–8.1)

## 2015-04-24 ENCOUNTER — Other Ambulatory Visit: Payer: Self-pay | Admitting: Family Medicine

## 2015-04-24 NOTE — Telephone Encounter (Signed)
Patient would like all his meds transferred to [facility] on [facility]. Please let pt know when this has been done.

## 2015-04-25 MED ORDER — LOSARTAN POTASSIUM 100 MG PO TABS: 100.0000 mg | ORAL_TABLET | Freq: Every day | ORAL | Status: DC

## 2015-04-25 MED ORDER — PRAVASTATIN SODIUM 40 MG PO TABS: 40.0000 mg | ORAL_TABLET | Freq: Every day | ORAL | Status: DC

## 2015-04-25 MED ORDER — ATENOLOL 50 MG PO TABS: 50.0000 mg | ORAL_TABLET | Freq: Two times a day (BID) | ORAL | Status: DC

## 2015-04-25 MED ORDER — HYDROCHLOROTHIAZIDE 25 MG PO TABS: 25.0000 mg | ORAL_TABLET | Freq: Every day | ORAL | Status: DC

## 2015-04-25 MED ORDER — METFORMIN HCL 1000 MG PO TABS: ORAL_TABLET | ORAL | Status: DC

## 2015-04-25 MED ORDER — AMLODIPINE BESYLATE 10 MG PO TABS: 10.0000 mg | ORAL_TABLET | Freq: Every day | ORAL | Status: DC

## 2015-04-25 MED ORDER — OMEPRAZOLE 20 MG PO CPDR: 20.0000 mg | DELAYED_RELEASE_CAPSULE | Freq: Every day | ORAL | Status: DC | PRN

## 2015-04-25 NOTE — Telephone Encounter (Signed)
Patient is aware that scripts have been sent to the pharmacy. Jazmin Hartsell,CMA

## 2015-05-18 ENCOUNTER — Ambulatory Visit (HOSPITAL_COMMUNITY)
Admission: RE | Admit: 2015-05-18 | Discharge: 2015-05-18 | Disposition: A | Discharge status: Home / Self Care | Payer: PPO | Source: Ambulatory Visit | Attending: Family Medicine | Admitting: Family Medicine

## 2015-05-18 ENCOUNTER — Ambulatory Visit (INDEPENDENT_AMBULATORY_CARE_PROVIDER_SITE_OTHER): Payer: PPO | Admitting: Family Medicine

## 2015-05-18 ENCOUNTER — Encounter (HOSPITAL_COMMUNITY): Payer: Self-pay | Admitting: Emergency Medicine

## 2015-05-18 ENCOUNTER — Inpatient Hospital Stay (HOSPITAL_COMMUNITY)
Admission: EM | Admit: 2015-05-18 | Discharge: 2015-05-21 | DRG: 176 | Disposition: A | Discharge status: Home / Self Care | Payer: PPO | Attending: Family Medicine | Admitting: Family Medicine

## 2015-05-18 ENCOUNTER — Inpatient Hospital Stay (HOSPITAL_COMMUNITY): Payer: PPO

## 2015-05-18 ENCOUNTER — Other Ambulatory Visit: Payer: Self-pay

## 2015-05-18 ENCOUNTER — Emergency Department (HOSPITAL_COMMUNITY): Payer: PPO

## 2015-05-18 ENCOUNTER — Ambulatory Visit: Payer: PPO | Admitting: Family Medicine

## 2015-05-18 ENCOUNTER — Encounter: Payer: Self-pay | Admitting: Family Medicine

## 2015-05-18 VITALS — BP 139/70 | HR 83 | Temp 97.9°F | Ht 71.0 in | Wt 181.6 lb

## 2015-05-18 DIAGNOSIS — R0602 Shortness of breath: Secondary | ICD-10-CM | POA: Diagnosis not present

## 2015-05-18 DIAGNOSIS — E119 Type 2 diabetes mellitus without complications: Secondary | ICD-10-CM | POA: Diagnosis not present

## 2015-05-18 DIAGNOSIS — Z7984 Long term (current) use of oral hypoglycemic drugs: Secondary | ICD-10-CM | POA: Diagnosis not present

## 2015-05-18 DIAGNOSIS — Z6825 Body mass index (BMI) 25.0-25.9, adult: Secondary | ICD-10-CM

## 2015-05-18 DIAGNOSIS — Z86711 Personal history of pulmonary embolism: Secondary | ICD-10-CM | POA: Diagnosis not present

## 2015-05-18 DIAGNOSIS — I824Y1 Acute embolism and thrombosis of unspecified deep veins of right proximal lower extremity: Secondary | ICD-10-CM | POA: Insufficient documentation

## 2015-05-18 DIAGNOSIS — E785 Hyperlipidemia, unspecified: Secondary | ICD-10-CM | POA: Diagnosis not present

## 2015-05-18 DIAGNOSIS — Z9842 Cataract extraction status, left eye: Secondary | ICD-10-CM | POA: Diagnosis not present

## 2015-05-18 DIAGNOSIS — I2692 Saddle embolus of pulmonary artery without acute cor pulmonale: Secondary | ICD-10-CM | POA: Diagnosis not present

## 2015-05-18 DIAGNOSIS — R0609 Other forms of dyspnea: Secondary | ICD-10-CM | POA: Diagnosis not present

## 2015-05-18 DIAGNOSIS — R079 Chest pain, unspecified: Secondary | ICD-10-CM | POA: Diagnosis not present

## 2015-05-18 DIAGNOSIS — M1990 Unspecified osteoarthritis, unspecified site: Secondary | ICD-10-CM | POA: Diagnosis not present

## 2015-05-18 DIAGNOSIS — I10 Essential (primary) hypertension: Secondary | ICD-10-CM | POA: Diagnosis not present

## 2015-05-18 DIAGNOSIS — R7989 Other specified abnormal findings of blood chemistry: Secondary | ICD-10-CM | POA: Diagnosis not present

## 2015-05-18 DIAGNOSIS — M109 Gout, unspecified: Secondary | ICD-10-CM | POA: Diagnosis present

## 2015-05-18 DIAGNOSIS — I82441 Acute embolism and thrombosis of right tibial vein: Secondary | ICD-10-CM | POA: Diagnosis present

## 2015-05-18 DIAGNOSIS — E669 Obesity, unspecified: Secondary | ICD-10-CM | POA: Diagnosis not present

## 2015-05-18 DIAGNOSIS — I2609 Other pulmonary embolism with acute cor pulmonale: Secondary | ICD-10-CM | POA: Diagnosis not present

## 2015-05-18 DIAGNOSIS — R0600 Dyspnea, unspecified: Secondary | ICD-10-CM

## 2015-05-18 DIAGNOSIS — Z9841 Cataract extraction status, right eye: Secondary | ICD-10-CM

## 2015-05-18 DIAGNOSIS — N179 Acute kidney failure, unspecified: Secondary | ICD-10-CM | POA: Diagnosis not present

## 2015-05-18 DIAGNOSIS — I2699 Other pulmonary embolism without acute cor pulmonale: Secondary | ICD-10-CM

## 2015-05-18 DIAGNOSIS — K219 Gastro-esophageal reflux disease without esophagitis: Secondary | ICD-10-CM | POA: Diagnosis not present

## 2015-05-18 DIAGNOSIS — E7800 Pure hypercholesterolemia, unspecified: Secondary | ICD-10-CM | POA: Diagnosis present

## 2015-05-18 HISTORY — DX: Pure hypercholesterolemia, unspecified: E78.00

## 2015-05-18 HISTORY — DX: Essential (primary) hypertension: I10

## 2015-05-18 HISTORY — DX: Type 2 diabetes mellitus without complications: E11.9

## 2015-05-18 LAB — TROPONIN I
TROPONIN I: 0.23 ng/mL — AB (ref <0.031)
Troponin I: 0.16 ng/mL — ABNORMAL HIGH (ref <0.031)

## 2015-05-18 LAB — CBC
HEMATOCRIT: 36 % — AB (ref 39.0–52.0)
HEMOGLOBIN: 11.2 g/dL — AB (ref 13.0–17.0)
MCH: 25.7 pg — AB (ref 26.0–34.0)
MCHC: 31.1 g/dL (ref 30.0–36.0)
MCV: 82.8 fL (ref 78.0–100.0)
Platelets: 362 10*3/uL (ref 150–400)
RBC: 4.35 MIL/uL (ref 4.22–5.81)
RDW: 17.2 % — ABNORMAL HIGH (ref 11.5–15.5)
WBC: 10.4 10*3/uL (ref 4.0–10.5)

## 2015-05-18 LAB — COMPREHENSIVE METABOLIC PANEL
ALK PHOS: 60 U/L (ref 38–126)
ALT: 10 U/L — ABNORMAL LOW (ref 17–63)
ANION GAP: 13 (ref 5–15)
AST: 18 U/L (ref 15–41)
Albumin: 3.4 g/dL — ABNORMAL LOW (ref 3.5–5.0)
BILIRUBIN TOTAL: 1.4 mg/dL — AB (ref 0.3–1.2)
BUN: 15 mg/dL (ref 6–20)
CALCIUM: 9.2 mg/dL (ref 8.9–10.3)
CO2: 24 mmol/L (ref 22–32)
CREATININE: 1.44 mg/dL — AB (ref 0.61–1.24)
Chloride: 100 mmol/L — ABNORMAL LOW (ref 101–111)
GFR calc non Af Amer: 46 mL/min — ABNORMAL LOW (ref >60)
GFR, EST AFRICAN AMERICAN: 54 mL/min — AB (ref >60)
Glucose, Bld: 146 mg/dL — ABNORMAL HIGH (ref 65–99)
Potassium: 4 mmol/L (ref 3.5–5.1)
SODIUM: 137 mmol/L (ref 135–145)
TOTAL PROTEIN: 7.6 g/dL (ref 6.5–8.1)

## 2015-05-18 LAB — BRAIN NATRIURETIC PEPTIDE: B Natriuretic Peptide: 421.1 pg/mL — ABNORMAL HIGH (ref 0.0–100.0)

## 2015-05-18 LAB — ECHOCARDIOGRAM COMPLETE
Height: 71 in
WEIGHTICAEL: 2896 [oz_av]

## 2015-05-18 LAB — PROTIME-INR
INR: 1.2 (ref 0.00–1.49)
PROTHROMBIN TIME: 15.4 s — AB (ref 11.6–15.2)

## 2015-05-18 LAB — I-STAT TROPONIN, ED: TROPONIN I, POC: 0.21 ng/mL — AB (ref 0.00–0.08)

## 2015-05-18 LAB — D-DIMER, QUANTITATIVE: D-Dimer, Quant: 1.76 ug/mL-FEU — ABNORMAL HIGH (ref 0.00–0.50)

## 2015-05-18 LAB — GLUCOSE, CAPILLARY
GLUCOSE-CAPILLARY: 166 mg/dL — AB (ref 65–99)
Glucose-Capillary: 109 mg/dL — ABNORMAL HIGH (ref 65–99)

## 2015-05-18 LAB — MRSA PCR SCREENING: MRSA BY PCR: NEGATIVE

## 2015-05-18 LAB — HEPARIN LEVEL (UNFRACTIONATED): HEPARIN UNFRACTIONATED: 0.79 [IU]/mL — AB (ref 0.30–0.70)

## 2015-05-18 LAB — APTT: aPTT: 33 seconds (ref 24–37)

## 2015-05-18 IMAGING — CR DG CHEST 2V
2 series · 2 of 2 positions shown · non-contrast
Comparison: [DATE].

CLINICAL DATA: Shortness of breath.

EXAM:
CHEST  2 VIEW

[chest pa]
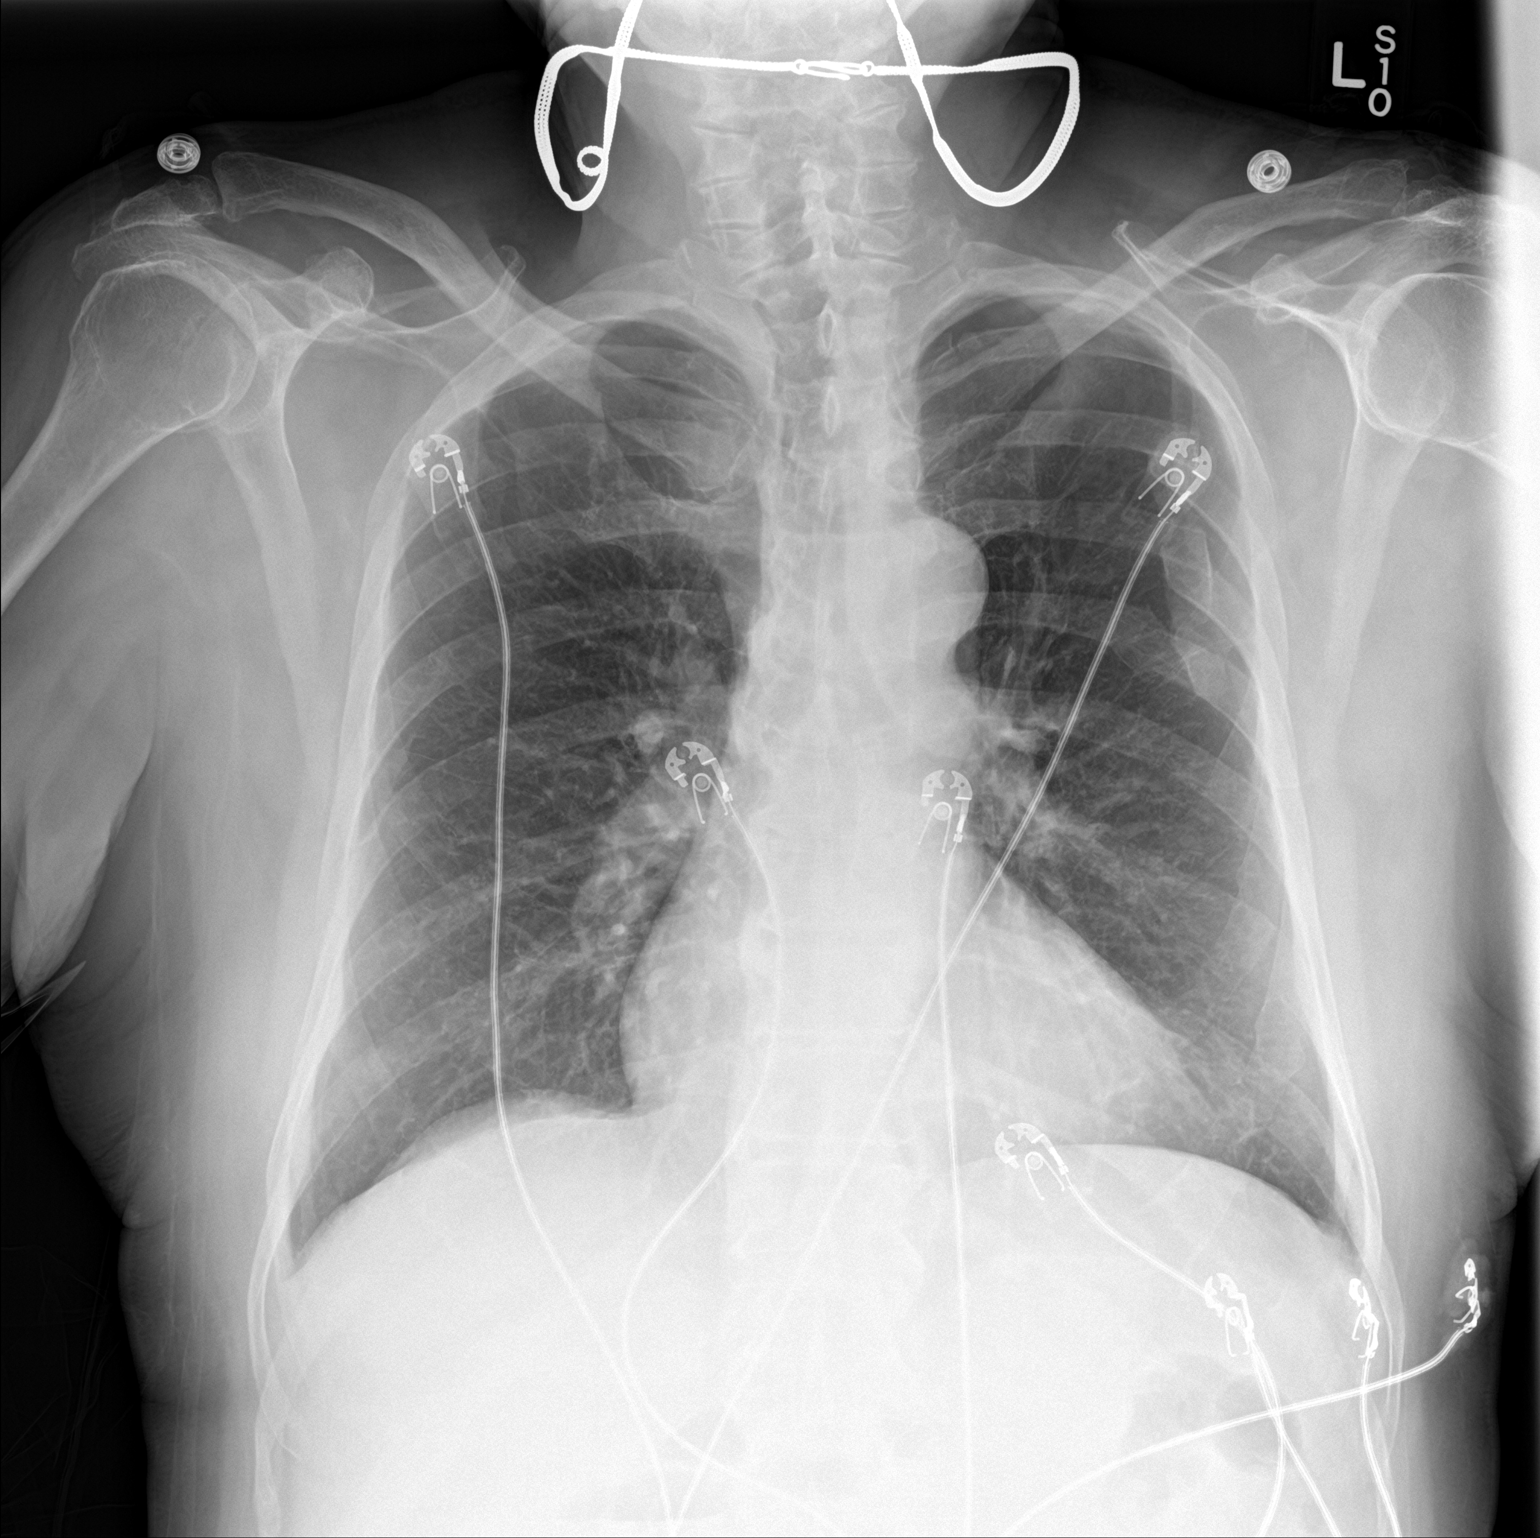

[chest lat]
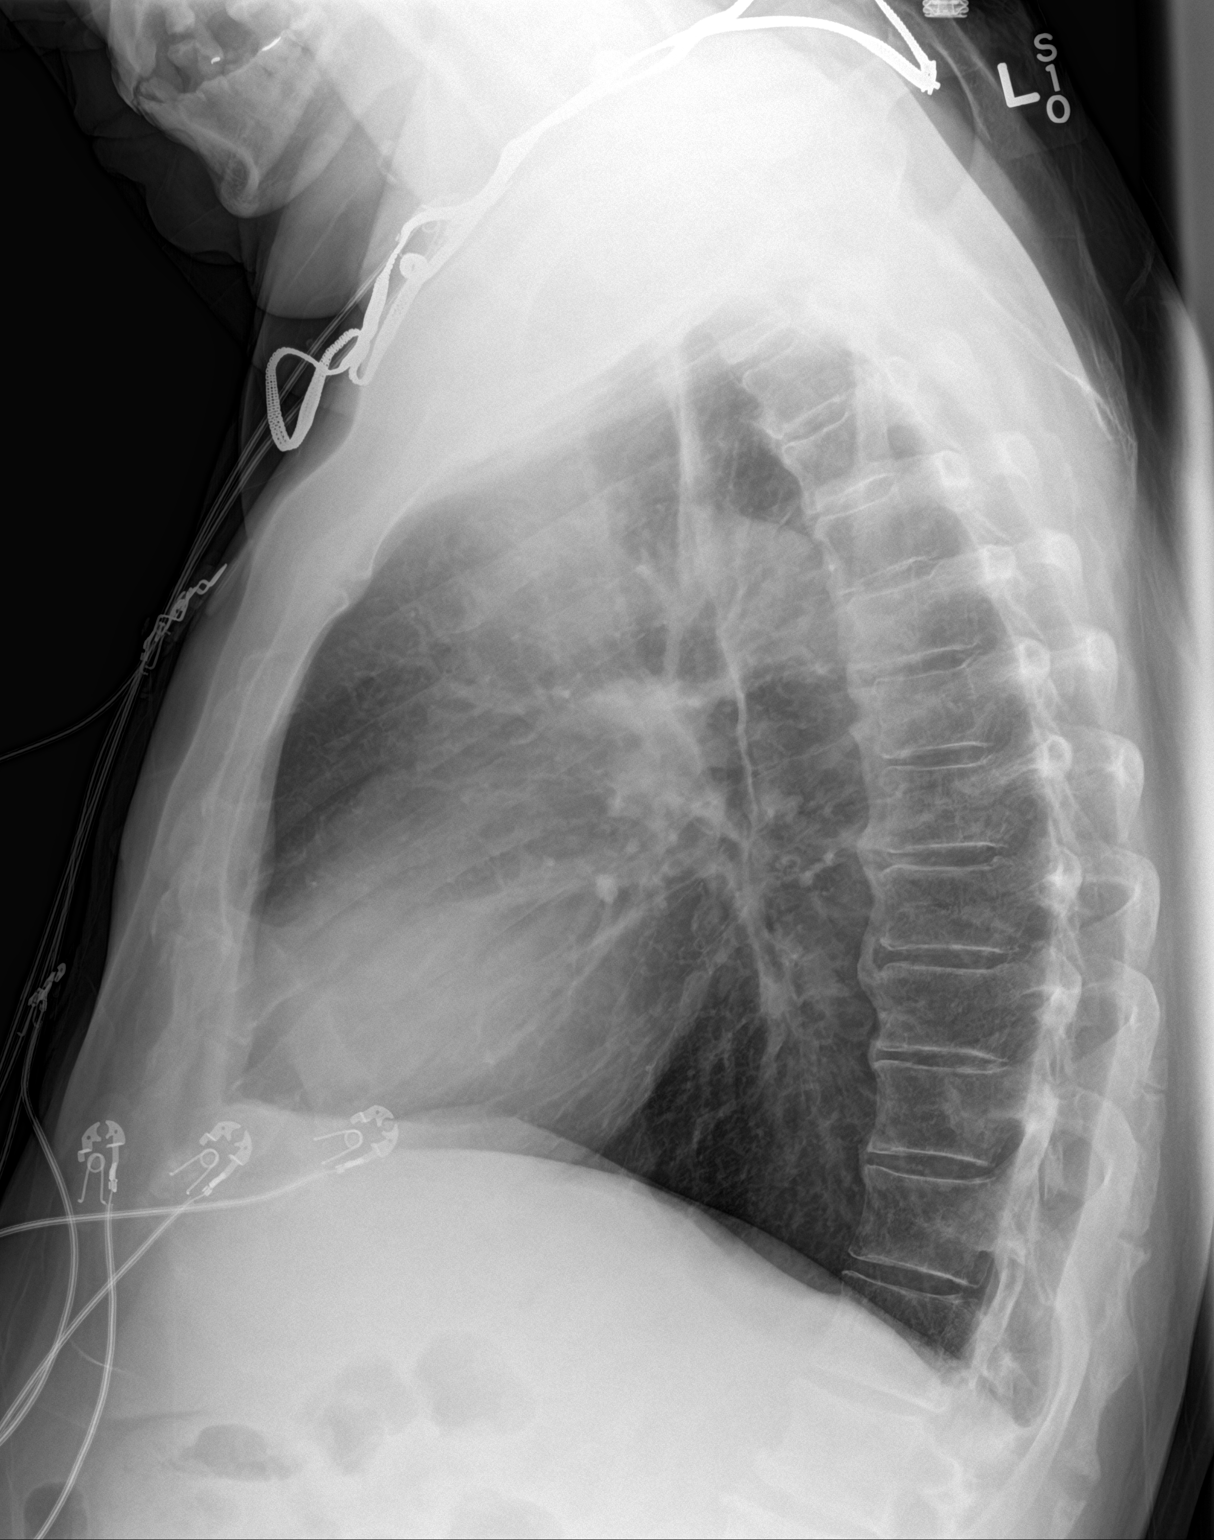

[2 of 2 positions shown; findings below may reference images not displayed]

FINDINGS: The heart size and mediastinal contours are within normal limits.
Both lungs are clear. No pneumothorax or pleural effusion is noted.
Old left rib fractures are noted.
IMPRESSION: No active cardiopulmonary disease.

## 2015-05-18 IMAGING — CT CT ANGIO CHEST
2 of 8 series · 18 of 46 positions shown · IV contrast (OMNI)
Comparison: [DATE]

CLINICAL DATA: Shortness of breath and dyspnea on exertion for 2-3
days.

EXAM:
CT ANGIOGRAPHY CHEST WITH CONTRAST
TECHNIQUE: Multidetector CT imaging of the chest was performed using the
standard protocol during bolus administration of intravenous
contrast. Multiplanar CT image reconstructions and MIPs were
obtained to evaluate the vascular anatomy.
CONTRAST:  100 cc Isovue 370 intravenous

[Series 5: thins · axial · 0.68mm/px · z∈[+1150,+1399]mm · 15 of 275 slices shown]
[im 13/275  lung]
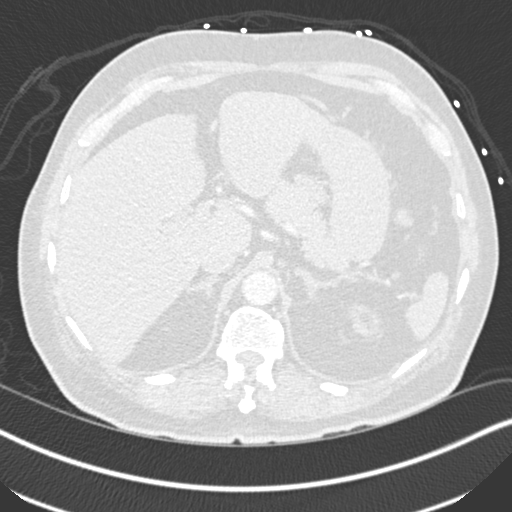
[im 38/275  soft-tissue]
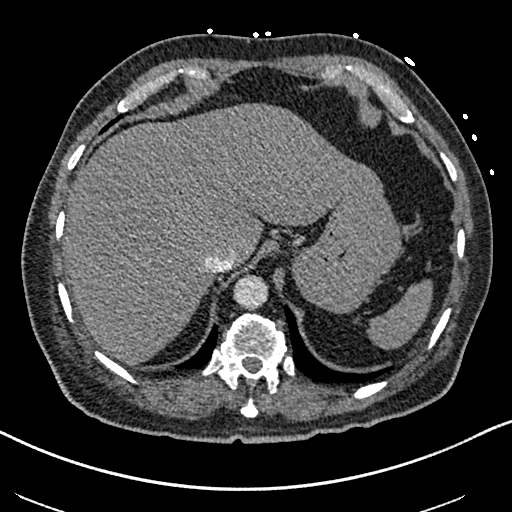
[im 50/275  lung]
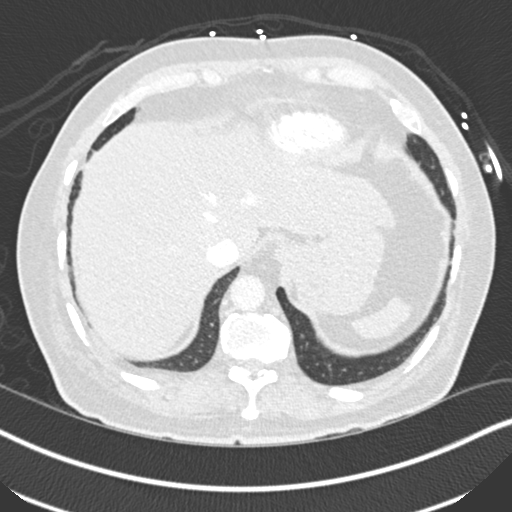
[im 63/275  soft-tissue]
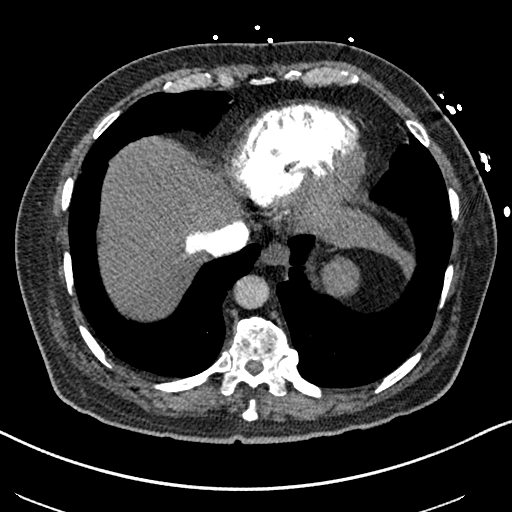
[im 88/275  lung]
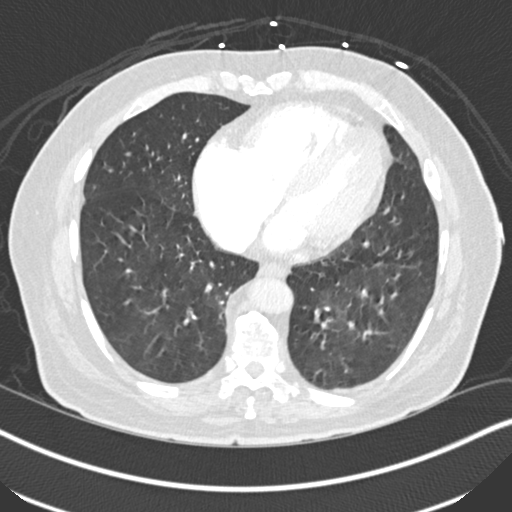
[im 100/275  soft-tissue]
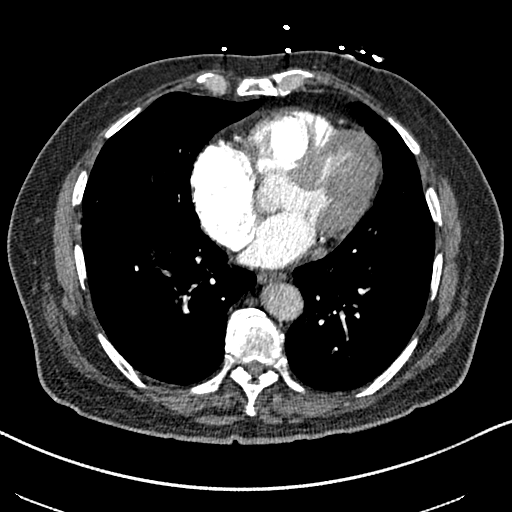
[im 125/275  lung]
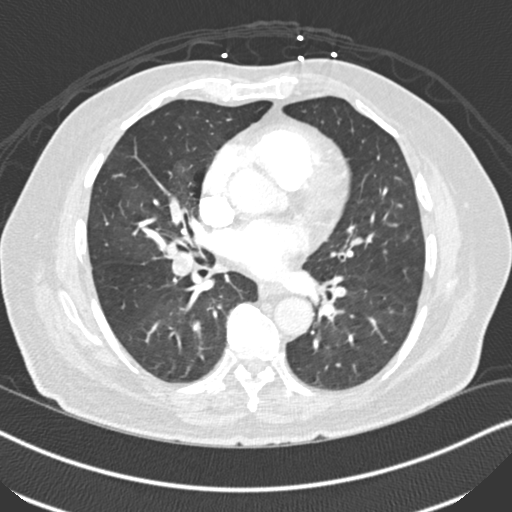
[im 138/275  soft-tissue]
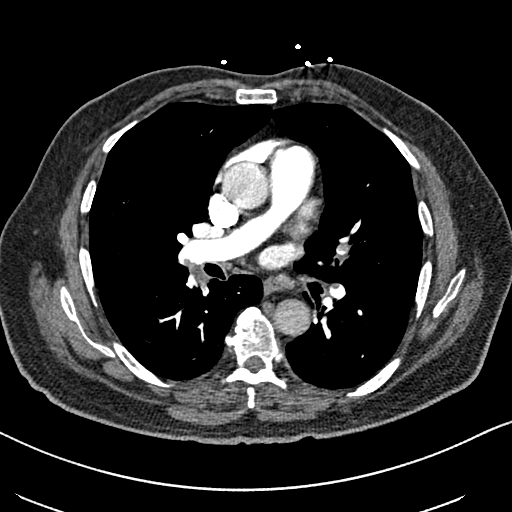
[im 150/275  lung]
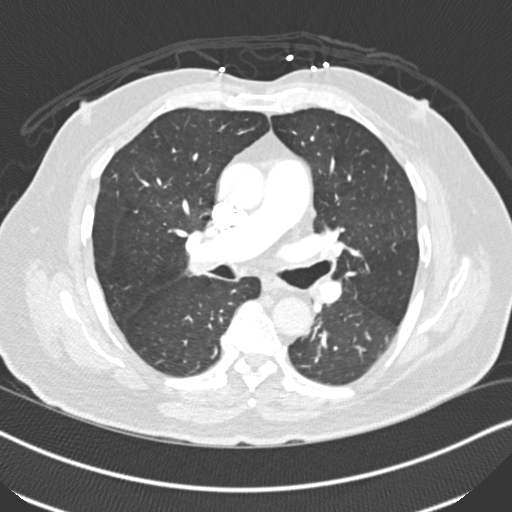
[im 175/275  soft-tissue]
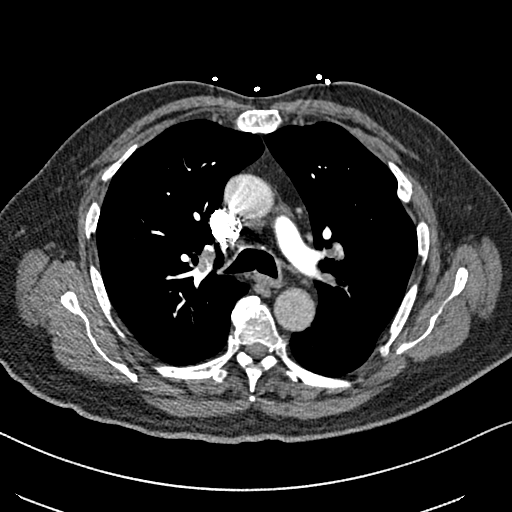
[im 187/275  lung]
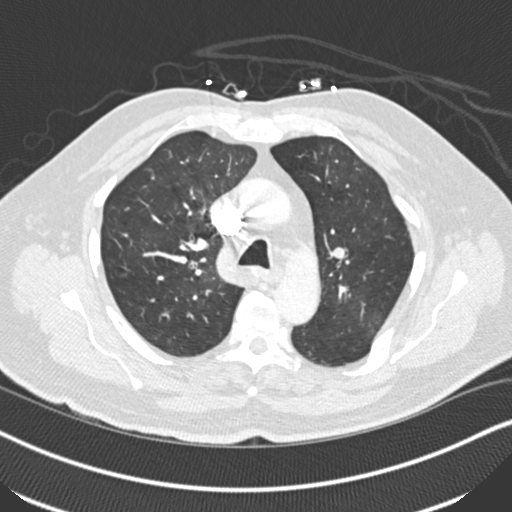
[im 212/275  soft-tissue]
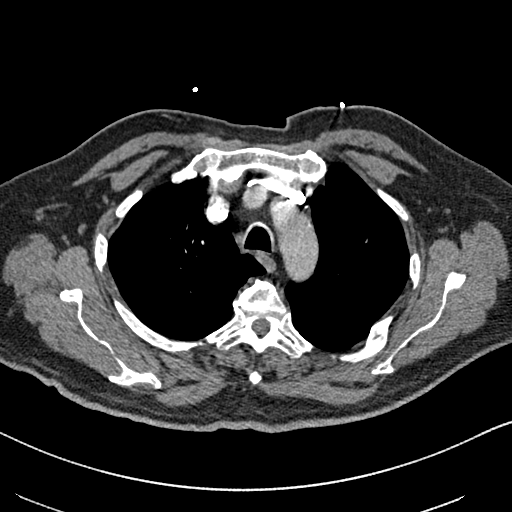
[im 225/275  lung]
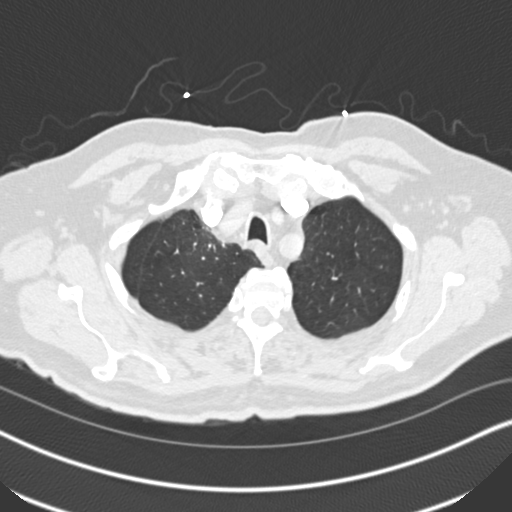
[im 237/275  soft-tissue]
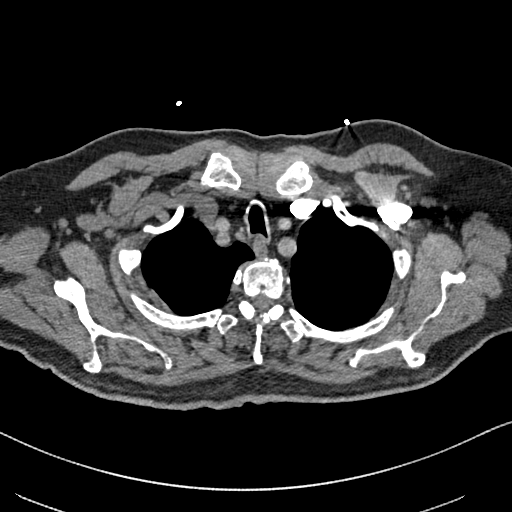
[im 262/275  lung]
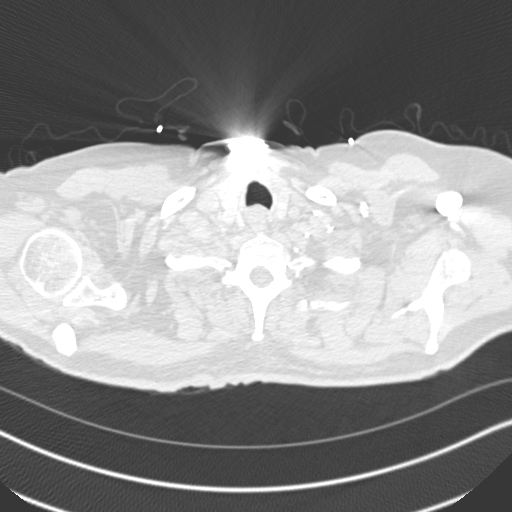

[Series 7: coronal mpr · coronal · 0.56mm/px · 3 of 151 slices shown]
[im 38/151  soft-tissue]
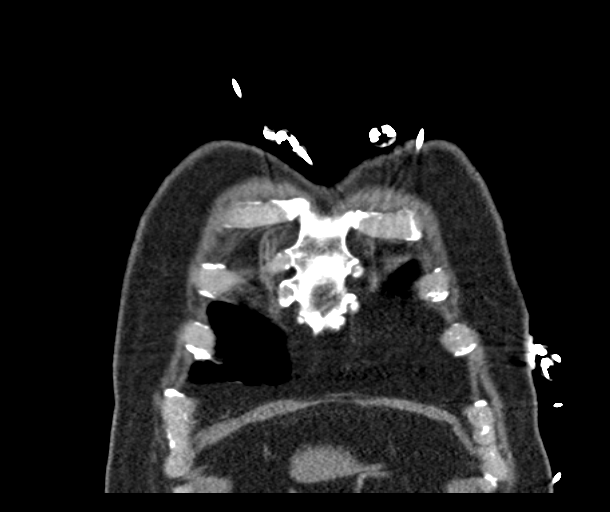
[im 76/151  soft-tissue]
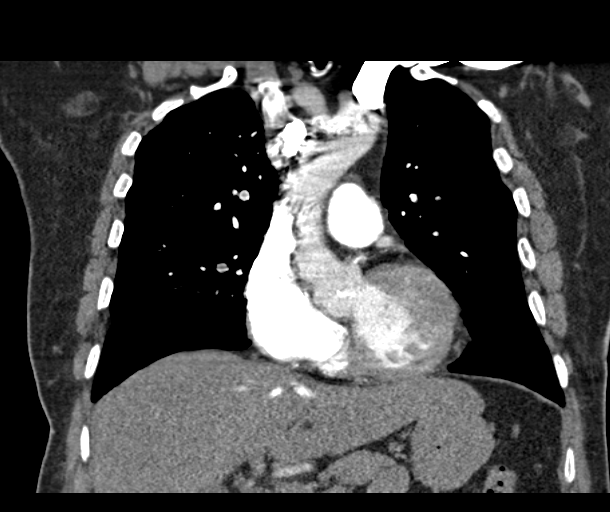
[im 113/151  soft-tissue]
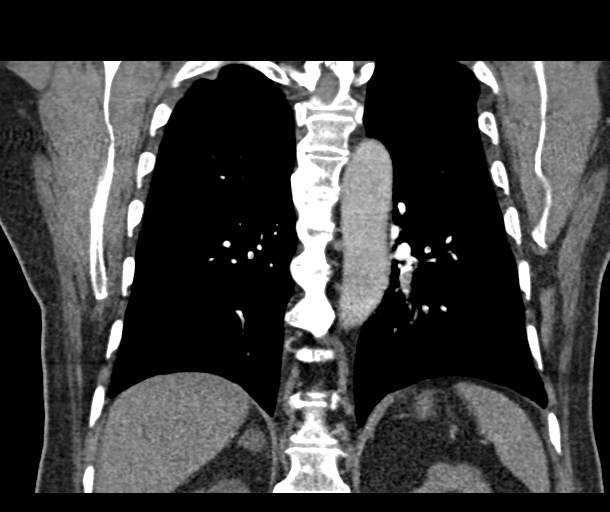

[18 of 46 positions shown; findings below may reference images not displayed]

FINDINGS: THORACIC INLET/BODY WALL:

No acute abnormality.

MEDIASTINUM:

Bilateral acute pulmonary embolism with clot branching at the left
main pulmonary artery and lobar/interlobar level on the right.
Occlusive segmental thrombus is seen at multiple levels. Right
ventricular enlargement with RV to LV ratio of 1.9. Intrahepatic
venous reflux. Limited systemic arterial enhancement with no acute
findings in the aorta great vessels.

LUNG WINDOWS:

Negative for lung infarct. Mild diffuse bronchial wall thickening in
saber trachea, possible COPD.

UPPER ABDOMEN:

No acute finding.  Stable 18 mm right adrenal adenoma.

OSSEOUS:

Remote left rib fractures. Spondylosis with mid thoracic ankylosis.
No acute osseous finding.

Critical Value/emergent results were called by telephone at the time
of interpretation on [DATE] at [DATE] to Dr. SADIQ , who
verbally acknowledged these results.

Review of the MIP images confirms the above findings.
IMPRESSION: Acute pulmonary embolism with CT evidence of right heart strain
(RV/LV Ratio = 1.9), at least submassive and intermediate risk PE.
The presence of right heart strain has been associated with an
increased risk of morbidity and mortality. Please activate Code PE
by paging [[email or web address]].

## 2015-05-18 MED ORDER — ACETAMINOPHEN 650 MG RE SUPP: 650.0000 mg | Freq: Four times a day (QID) | RECTAL | Status: DC | PRN

## 2015-05-18 MED ORDER — POLYETHYLENE GLYCOL 3350 17 G PO PACK
17.0000 g | PACK | Freq: Every day | ORAL | Status: DC | PRN
  Administered 2015-05-21: 17 g via ORAL
  Filled 2015-05-18: qty 1

## 2015-05-18 MED ORDER — ACETAMINOPHEN 325 MG PO TABS: 650.0000 mg | ORAL_TABLET | Freq: Four times a day (QID) | ORAL | Status: DC | PRN

## 2015-05-18 MED ORDER — HYDROCHLOROTHIAZIDE 25 MG PO TABS
25.0000 mg | ORAL_TABLET | Freq: Every day | ORAL | Status: DC
  Administered 2015-05-19 – 2015-05-21 (×3): 25 mg via ORAL
  Filled 2015-05-18 (×3): qty 1

## 2015-05-18 MED ORDER — IOPAMIDOL (ISOVUE-370) INJECTION 76%
INTRAVENOUS | Status: AC
  Administered 2015-05-18: 100 mL via INTRAVENOUS
  Filled 2015-05-18: qty 100

## 2015-05-18 MED ORDER — AMLODIPINE BESYLATE 10 MG PO TABS
10.0000 mg | ORAL_TABLET | Freq: Every day | ORAL | Status: DC
  Administered 2015-05-18 – 2015-05-20 (×3): 10 mg via ORAL
  Filled 2015-05-18 (×3): qty 1

## 2015-05-18 MED ORDER — HEPARIN BOLUS VIA INFUSION
4500.0000 [IU] | Freq: Once | INTRAVENOUS | Status: AC
  Administered 2015-05-18: 4500 [IU] via INTRAVENOUS
  Filled 2015-05-18: qty 4500

## 2015-05-18 MED ORDER — ASPIRIN 81 MG PO CHEW
324.0000 mg | CHEWABLE_TABLET | Freq: Once | ORAL | Status: AC
  Administered 2015-05-18: 324 mg via ORAL
  Filled 2015-05-18: qty 4

## 2015-05-18 MED ORDER — SODIUM CHLORIDE 0.9% FLUSH
3.0000 mL | Freq: Two times a day (BID) | INTRAVENOUS | Status: DC
Start: 2015-05-18 — End: 2015-05-21
  Administered 2015-05-19 – 2015-05-20 (×2): 3 mL via INTRAVENOUS

## 2015-05-18 MED ORDER — PRAVASTATIN SODIUM 40 MG PO TABS: 40.0000 mg | ORAL_TABLET | Freq: Every day | ORAL | Status: DC

## 2015-05-18 MED ORDER — SODIUM CHLORIDE 0.9 % IV BOLUS (SEPSIS)
250.0000 mL | Freq: Once | INTRAVENOUS | Status: AC
  Administered 2015-05-18: 250 mL via INTRAVENOUS

## 2015-05-18 MED ORDER — PRAVASTATIN SODIUM 40 MG PO TABS
40.0000 mg | ORAL_TABLET | Freq: Every day | ORAL | Status: DC
  Administered 2015-05-18: 40 mg via ORAL
  Filled 2015-05-18: qty 1

## 2015-05-18 MED ORDER — HEPARIN (PORCINE) IN NACL 100-0.45 UNIT/ML-% IJ SOLN
1200.0000 [IU]/h | INTRAMUSCULAR | Status: DC
  Administered 2015-05-18: 1300 [IU]/h via INTRAVENOUS
  Administered 2015-05-19: 1200 [IU]/h via INTRAVENOUS
  Filled 2015-05-18 (×3): qty 250

## 2015-05-18 MED ORDER — INSULIN ASPART 100 UNIT/ML ~~LOC~~ SOLN
0.0000 [IU] | Freq: Three times a day (TID) | SUBCUTANEOUS | Status: DC
  Administered 2015-05-19: 1 [IU] via SUBCUTANEOUS
  Administered 2015-05-20: 2 [IU] via SUBCUTANEOUS
  Administered 2015-05-20: 1 [IU] via SUBCUTANEOUS
  Administered 2015-05-20: 2 [IU] via SUBCUTANEOUS
  Administered 2015-05-21: 1 [IU] via SUBCUTANEOUS

## 2015-05-18 MED ORDER — LOSARTAN POTASSIUM 50 MG PO TABS
100.0000 mg | ORAL_TABLET | Freq: Every day | ORAL | Status: DC
  Administered 2015-05-19 – 2015-05-21 (×3): 100 mg via ORAL
  Filled 2015-05-18 (×3): qty 2

## 2015-05-18 MED ORDER — SODIUM CHLORIDE 0.9 % IV SOLN: INTRAVENOUS | Status: AC

## 2015-05-18 MED ORDER — ATENOLOL 25 MG PO TABS
50.0000 mg | ORAL_TABLET | Freq: Two times a day (BID) | ORAL | Status: DC
  Administered 2015-05-18 – 2015-05-21 (×6): 50 mg via ORAL
  Filled 2015-05-18 (×6): qty 2

## 2015-05-18 NOTE — H&P (Signed)
[facility] Admission History and Physical Service Pager: [phone]  Patient name: Arthur [name] Medical record number: [id number] Date of birth: 1940-11-05 Age: 75 y.o. Gender: male  Primary Care Provider: Lind Covert, MD Consultants: CCM  Code Status: FULL (discussed at admission)   Chief Complaint: DOE  Assessment and Plan: Arthur [name] is a 75 y.o. male presenting with dyspnea on exertion. PMH is significant for HTN, T2DM, GERD, gout, OA, and HLD.   Dyspnea, PE: Patient presenting with DOE without any associated symptoms. CTA chest showed acute pulmonary embolism with evidence of right heart strain. EKG with new T wave inversions in the lateral leads, V3-6. iStat Troponin elevated to 0.21 in the ED.  Will proceed with ACS r/o as potential cause of dyspnea. Patient has a negative stress test from 7 years ago. Patient started on heparin drip per ED. BNP elevated to 421.1. CXR with some hilar prominence present on previous imaging. Patient does not have a known history of CHF. Patient without leukocytosis, afebrile and clear CXR for infectious process so doubt PNA as cause. Hemodynamically stable.  -admit to telemetry, vital signs per unit  -CCM evaluating patient per code PE protocol, will f/u recommendations   -continue heparin gtt -EKG in AM  -trend troponins  -consider cardiology consult given elevate troponin  -echo to evaluate RV  -LE dopplers -f/u regarding if patient is UTD on cancer screenings  -May need tpa or EKOS if becomes unstable - CCM following along -Will discuss options for oral anticoagulation  AKI: Cr mildly elevated to 1.44 (bl 1.1). Likely pre-renal as BUN:Cr <20.  -Will give small 250cc bolus since given contrast load -IVF NS at 125 cc/hr for 12 hours  -BMET in AM   HTN: Normotensive at admission.  -continue home amlodipine 10 mg, atenolol 50 mg BID, HCTZ 25 mg daily, and Losartan 100 mg daily   T2DM: Well  controlled. Last HgB A1c 6.8 (2/17).  -hold metformin given AKI  -sensitive SSI   HLD: Last lipid panel (2/17): Cholesterol 163, Triglycerides 152, HDL 43, LDL 90.  -continue pravastatin 40 mg daily   FEN/GI: Carb Modified Diet, NS at 125 cc/hr for 12 hours  Prophylaxis: Heparin for VTE treatment   Disposition: Admit to FPTS; Attending [place]  History of Present Illness:  Arthur [name] is a 75 y.o. male presenting with dyspnea on exertion. Patient reports SOB with exertion for past 2-3 days. SOB resolves with rest. He denies cough or cold symptoms. Denies chest pain or pressure. Denies diaphoresis, pain in jaw/neck/arms/back, N/V, and dizziness. Denies orthopnea and LE edema.   Patient denies ever having SOB in the past. Denies recent car travel or air travel. No history of cancer.   He initially presented to the [facility] and had an EKG with lateral T-wave inversions in lateral leads, concerning for cardiac etiology. He was sent to the ED for further work up.  In the ED, patient was found to have elevated troponin and D-dimer. He was given ASA and started on heparin gtt.   Review Of Systems: Per HPI with the following additions: None  Otherwise the remainder of the systems were negative.  Patient Active Problem List   Diagnosis Date Noted  . Dyspnea on exertion 05/18/2015  . GERD (gastroesophageal reflux disease) 07/05/2013  . Osteoarthritis 02/26/2010  . GOUT, UNSPECIFIED 11/06/2009  . Diabetes mellitus, type II ([place]) 09/22/2009  . Hyperlipidemia 09/22/2009  . OBESITY, UNSPECIFIED 09/22/2009  . Essential hypertension, benign 09/22/2009  Past Medical History: Past Medical History  Diagnosis Date  . Normal nuclear stress test 2010    stress perfusion study apparently in 2010 in [facility] which he said was negative.  . Hypertension   . Diabetes mellitus without complication ([place])   . High cholesterol     Past Surgical History: Past Surgical History  Procedure  Laterality Date  . Knee surgery  2005  . Cataract extraction, bilateral  2006    Social History: Social History  Substance Use Topics  . Smoking status: Never Smoker   . Smokeless tobacco: Never Used  . Alcohol Use: No   Additional social history: None   Please also refer to relevant sections of EMR.  Family History: Family History  Problem Relation Age of Onset  . Coronary artery disease Father   . Coronary artery disease Mother     Allergies and Medications: Allergies  Allergen Reactions  . Ace Inhibitors     REACTION: Cough   No current facility-administered medications on file prior to encounter.   Current Outpatient Prescriptions on File Prior to Encounter  Medication Sig Dispense Refill  . amLODipine (NORVASC) 10 MG tablet Take 1 tablet (10 mg total) by mouth at bedtime. 90 tablet 3  . atenolol (TENORMIN) 50 MG tablet Take 1 tablet (50 mg total) by mouth 2 (two) times daily. 180 tablet 3  . hydrochlorothiazide (HYDRODIURIL) 25 MG tablet Take 1 tablet (25 mg total) by mouth daily. 90 tablet 3  . losartan (COZAAR) 100 MG tablet Take 1 tablet (100 mg total) by mouth daily. 90 tablet 3  . metFORMIN (GLUCOPHAGE) 1000 MG tablet Take 1 tablet by mouth twice a day with a meal 180 tablet 3  . omeprazole (PRILOSEC) 20 MG capsule Take 1 capsule (20 mg total) by mouth daily as needed. 90 capsule 1  . pravastatin (PRAVACHOL) 40 MG tablet Take 1 tablet (40 mg total) by mouth daily. 90 tablet 3  . benzonatate (TESSALON) 100 MG capsule Take 1 capsule (100 mg total) by mouth 2 (two) times daily as needed for cough. (Patient not taking: Reported on 05/18/2015) 30 capsule 0    Objective: BP 133/76 mmHg  Pulse 76  Temp(Src) 98.9 F (37.2 C) (Oral)  Resp 18  Ht '5\' 11"'$  (1.803 m)  Wt 181 lb (82.101 kg)  BMI 25.26 kg/m2  SpO2 96% Exam: General: lying in bed, NAD, non-toxic  Eyes: EOMI. PERRL.  ENTM: Oropharynx clear.  Neck: Full ROM.  Cardiovascular: RRR. No murmurs appreciated.  2+ pedal pulses. No LE edema.  Respiratory: CTAB. Normal WOB. No respiratory distress.  Abdomen: +BS, soft, NTND  MSK: Moves all extremities spontaneously.  Skin: Warm and dry.  Neuro: CN II-XII grossly intact. Strength 5/5 in all extremities.  Psych: Appropriate mood and affect.   Labs and Imaging: CBC BMET   Recent Labs Lab 05/18/15 1050  WBC 10.4  HGB 11.2*  HCT 36.0*  PLT 362    Recent Labs Lab 05/18/15 1050  NA 137  K 4.0  CL 100*  CO2 24  BUN 15  CREATININE 1.44*  GLUCOSE 146*  CALCIUM 9.2     Dg Chest 2 View  05/18/2015  CLINICAL DATA:  Shortness of breath. EXAM: CHEST  2 VIEW COMPARISON:  March 15, 2014. FINDINGS: The heart size and mediastinal contours are within normal limits. Both lungs are clear. No pneumothorax or pleural effusion is noted. Old left rib fractures are noted. IMPRESSION: No active cardiopulmonary disease. Electronically Signed  By: Marijo Conception, M.D.   On: 05/18/2015 11:36   Ct Angio Chest Pe W/cm &/or Wo Cm  05/18/2015  CLINICAL DATA:  Shortness of breath and dyspnea on exertion for 2-3 days. EXAM: CT ANGIOGRAPHY CHEST WITH CONTRAST TECHNIQUE: Multidetector CT imaging of the chest was performed using the standard protocol during bolus administration of intravenous contrast. Multiplanar CT image reconstructions and MIPs were obtained to evaluate the vascular anatomy. CONTRAST:  100 cc Isovue 370 intravenous COMPARISON:  05/09/2014 FINDINGS: THORACIC INLET/BODY WALL: No acute abnormality. MEDIASTINUM: Bilateral acute pulmonary embolism with clot branching at the left main pulmonary artery and lobar/interlobar level on the right. Occlusive segmental thrombus is seen at multiple levels. Right ventricular enlargement with RV to LV ratio of 1.9. Intrahepatic venous reflux. Limited systemic arterial enhancement with no acute findings in the aorta great vessels. LUNG WINDOWS: Negative for lung infarct. Mild diffuse bronchial wall thickening in saber  trachea, possible COPD. UPPER ABDOMEN: No acute finding.  Stable 18 mm right adrenal adenoma. OSSEOUS: Remote left rib fractures. Spondylosis with mid thoracic ankylosis. No acute osseous finding. Critical Value/emergent results were called by telephone at the time of interpretation on 05/18/2015 at 12:38 pm to Dr. Dorie Rank , who verbally acknowledged these results. Review of the MIP images confirms the above findings. IMPRESSION: Acute pulmonary embolism with CT evidence of right heart strain (RV/LV Ratio = 1.9), at least submassive and intermediate risk PE. The presence of right heart strain has been associated with an increased risk of morbidity and mortality. Please activate Code PE by paging [phone]. Electronically Signed   By: Monte Fantasia M.D.   On: 05/18/2015 12:40   Nicolette Bang, DO 05/18/2015, 12:04 PM PGY-1, [place] Intern pager: [phone], text pages welcome  Upper Level Resident Addendum:  I have read the above note and made revisions highlighted in blue.  Algis Greenhouse. Jerline Pain, [place] [facility] Resident PGY-2 05/18/2015 2:34 PM

## 2015-05-18 NOTE — ED Notes (Signed)
ECHO at bedside.

## 2015-05-18 NOTE — Progress Notes (Signed)
[place] for heparin Indication: pulmonary embolus  Allergies  Allergen Reactions  . Ace Inhibitors     REACTION: Cough    Patient Measurements: Height: '5\' 11"'$  (180.3 cm) Weight: 180 lb 5.4 oz (81.8 kg) IBW/kg (Calculated) : 75.3 Heparin Dosing Weight: 82kg  Vital Signs: Temp: 98.1 F (36.7 C) (04/20 1947) Temp Source: Oral (04/20 1947) BP: 120/72 mmHg (04/20 2137) Pulse Rate: 84 (04/20 2137)  Labs:  Recent Labs  05/18/15 1050 05/18/15 1704 05/18/15 2100  HGB 11.2*  --   --   HCT 36.0*  --   --   PLT 362  --   --   APTT 33  --   --   LABPROT 15.4*  --   --   INR 1.20  --   --   HEPARINUNFRC  --   --  0.79*  CREATININE 1.44*  --   --   TROPONINI  --  0.23*  --     Estimated Creatinine Clearance: 47.9 mL/min (by C-G formula based on Cr of 1.44).   Medical History: Past Medical History  Diagnosis Date  . Normal nuclear stress test 2010    stress perfusion study apparently in 2010 in [facility] which he said was negative.  . Hypertension   . Diabetes mellitus without complication ([place])   . High cholesterol     Medications:  Infusions:  . sodium chloride 125 mL/hr at 05/18/15 2100  . heparin 1,300 Units/hr (05/18/15 2100)    Assessment: 22 yom presented to the ED with SOB. Baseline H/H is slightly low and platelets are WNL. He is not on anticoagulation PTA. CT showed an acute PE with right heart strain. To start IV heparin.   Initial HL slightly elevated at 0.79  Goal of Therapy:  Heparin level 0.3-0.7 units/ml Monitor platelets by anticoagulation protocol: Yes   Plan:  - Heparin to 1200 units / hr  - Daily heparin level and CBC - F/u plans for oral anticoag  Tad Moore 05/18/2015,9:48 PM

## 2015-05-18 NOTE — Progress Notes (Signed)
ANTICOAGULATION CONSULT NOTE - Initial Consult  Pharmacy Consult for heparin Indication: pulmonary embolus  Allergies  Allergen Reactions  . Ace Inhibitors     REACTION: Cough    Patient Measurements: Height: '5\' 11"'$  (180.3 cm) Weight: 181 lb (82.101 kg) IBW/kg (Calculated) : 75.3 Heparin Dosing Weight: 82kg  Vital Signs: Temp: 98.9 F (37.2 C) (04/20 0943) Temp Source: Oral (04/20 0943) BP: 131/76 mmHg (04/20 1200) Pulse Rate: 77 (04/20 1200)  Labs:  Recent Labs  05/18/15 1050  HGB 11.2*  HCT 36.0*  PLT 362  APTT 33  LABPROT 15.4*  INR 1.20  CREATININE 1.44*    Estimated Creatinine Clearance: 47.9 mL/min (by C-G formula based on Cr of 1.44).   Medical History: Past Medical History  Diagnosis Date  . Normal nuclear stress test 2010    stress perfusion study apparently in 2010 in [facility] which he said was negative.  . Hypertension   . Diabetes mellitus without complication ([place])   . High cholesterol     Medications:  Infusions:  . heparin      Assessment: 24 yom presented to the ED with SOB. Baseline H/H is slightly low and platelets are WNL. He is not on anticoagulation PTA. CT showed an acute PE with right heart strain. To start IV heparin.   Goal of Therapy:  Heparin level 0.3-0.7 units/ml Monitor platelets by anticoagulation protocol: Yes   Plan:  - Heparin bolus 4500 units IV x 1 - Heparin gtt 1300 units/hr - Check an 8 hour heparin level - Daily heparin level and CBC - F/u plans for oral anticoag  Barry Culverhouse, Rande Lawman 05/18/2015,12:45 PM

## 2015-05-18 NOTE — ED Notes (Signed)
ECHO completed at bedside. Paged admitting Doctor regarding IV fluid bolus.

## 2015-05-18 NOTE — Consult Note (Signed)
PULMONARY / CRITICAL CARE MEDICINE   Name: Arthur [name] MRN: [id number] DOB: 09-Aug-1940    ADMISSION DATE:  05/18/2015 CONSULTATION DATE:  05/18/15  REFERRING MD:  ED  CHIEF COMPLAINT:  Dyspnea on exertion, submassive PE.  HISTORY OF PRESENT ILLNESS:   Arthur [name] is a 75 year old with history of hypertension, diabetes, hyperlipidemia. He complains of acute onset of dyspnea on exertion for the past 2 days. He denies any lower extremity swelling, chest pain, palpitation, dyspnea, wheezing. He gets short of breath with minimal exertion. This is relieved on sitting down for a minute or 2. He was seen by his primary care doctor today and noted to have T-wave inversions on EKG and sent to ED for further evaluation.  In ED he remained hemodynamically stable with good sats on room air and HR in 70s. He had a positive d-dimer (1.76) and mildly elevated troponin (0.21). CT of the chest shows bilateral submassive PE with RV/LV ratio of 1.9. He traveled to [place] from November 2016 to January 2017. Otherwise he has no recent immobilization, travel history, no family history of blood clots.  PAST MEDICAL HISTORY :  He  has a past medical history of Normal nuclear stress test (2010); Hypertension; Diabetes mellitus without complication ([place]); and High cholesterol.  PAST SURGICAL HISTORY: He  has past surgical history that includes Knee surgery (2005) and Cataract extraction, bilateral (2006).  Allergies  Allergen Reactions  . Ace Inhibitors     REACTION: Cough    No current facility-administered medications on file prior to encounter.   Current Outpatient Prescriptions on File Prior to Encounter  Medication Sig  . amLODipine (NORVASC) 10 MG tablet Take 1 tablet (10 mg total) by mouth at bedtime.  Marland Kitchen atenolol (TENORMIN) 50 MG tablet Take 1 tablet (50 mg total) by mouth 2 (two) times daily.  . hydrochlorothiazide (HYDRODIURIL) 25 MG tablet Take 1 tablet (25 mg total) by mouth daily.  Marland Kitchen losartan  (COZAAR) 100 MG tablet Take 1 tablet (100 mg total) by mouth daily.  . metFORMIN (GLUCOPHAGE) 1000 MG tablet Take 1 tablet by mouth twice a day with a meal  . omeprazole (PRILOSEC) 20 MG capsule Take 1 capsule (20 mg total) by mouth daily as needed.  . pravastatin (PRAVACHOL) 40 MG tablet Take 1 tablet (40 mg total) by mouth daily.  . benzonatate (TESSALON) 100 MG capsule Take 1 capsule (100 mg total) by mouth 2 (two) times daily as needed for cough. (Patient not taking: Reported on 05/18/2015)    FAMILY HISTORY:  His has no family status information on file.   SOCIAL HISTORY: He  reports that he has never smoked. He has never used smokeless tobacco. He reports that he does not drink alcohol or use illicit drugs.  REVIEW OF SYSTEMS:   Dyspnea on exertion, no chest pain, palpitation, cough, wheezing. No lower extremity edema, tenderness, swelling. No fevers, chills. All other review of systems are negative.  SUBJECTIVE:   VITAL SIGNS: BP 130/73 mmHg  Pulse 88  Temp(Src) 98.9 F (37.2 C) (Oral)  Resp 16  Ht '5\' 11"'$  (1.803 m)  Wt 181 lb (82.101 kg)  BMI 25.26 kg/m2  SpO2 96%  HEMODYNAMICS:    VENTILATOR SETTINGS:    INTAKE / OUTPUT:    PHYSICAL EXAMINATION: General:  Awake, alert, oriented, no distress. Neuro:  No gross focal deficits. HEENT:  Moist mucous membranes, no thyromegaly, JVD. Cardiovascular:  Regular rate and rhythm, no MRG Lungs:  Clear, no wheeze, crackles Abdomen:  Soft, positive bowel sounds Musculoskeletal:  Normal tone and bulk, no lower extremity swelling, tenderness. Skin: Intact.  LABS:  BMET  Recent Labs Lab 05/18/15 1050  NA 137  K 4.0  CL 100*  CO2 24  BUN 15  CREATININE 1.44*  GLUCOSE 146*    Electrolytes  Recent Labs Lab 05/18/15 1050  CALCIUM 9.2    CBC  Recent Labs Lab 05/18/15 1050  WBC 10.4  HGB 11.2*  HCT 36.0*  PLT 362    Coag's  Recent Labs Lab 05/18/15 1050  APTT 33  INR 1.20    Sepsis  Markers No results for input(s): LATICACIDVEN, PROCALCITON, O2SATVEN in the last 168 hours.  ABG No results for input(s): PHART, PCO2ART, PO2ART in the last 168 hours.  Liver Enzymes  Recent Labs Lab 05/18/15 1050  AST 18  ALT 10*  ALKPHOS 60  BILITOT 1.4*  ALBUMIN 3.4*    Cardiac Enzymes No results for input(s): TROPONINI, PROBNP in the last 168 hours.  Glucose No results for input(s): GLUCAP in the last 168 hours.  Imaging Dg Chest 2 View  05/18/2015  CLINICAL DATA:  Shortness of breath. EXAM: CHEST  2 VIEW COMPARISON:  March 15, 2014. FINDINGS: The heart size and mediastinal contours are within normal limits. Both lungs are clear. No pneumothorax or pleural effusion is noted. Old left rib fractures are noted. IMPRESSION: No active cardiopulmonary disease. Electronically Signed   By: Marijo Conception, M.D.   On: 05/18/2015 11:36   Ct Angio Chest Pe W/cm &/or Wo Cm  05/18/2015  CLINICAL DATA:  Shortness of breath and dyspnea on exertion for 2-3 days. EXAM: CT ANGIOGRAPHY CHEST WITH CONTRAST TECHNIQUE: Multidetector CT imaging of the chest was performed using the standard protocol during bolus administration of intravenous contrast. Multiplanar CT image reconstructions and MIPs were obtained to evaluate the vascular anatomy. CONTRAST:  100 cc Isovue 370 intravenous COMPARISON:  05/09/2014 FINDINGS: THORACIC INLET/BODY WALL: No acute abnormality. MEDIASTINUM: Bilateral acute pulmonary embolism with clot branching at the left main pulmonary artery and lobar/interlobar level on the right. Occlusive segmental thrombus is seen at multiple levels. Right ventricular enlargement with RV to LV ratio of 1.9. Intrahepatic venous reflux. Limited systemic arterial enhancement with no acute findings in the aorta great vessels. LUNG WINDOWS: Negative for lung infarct. Mild diffuse bronchial wall thickening in saber trachea, possible COPD. UPPER ABDOMEN: No acute finding.  Stable 18 mm right adrenal  adenoma. OSSEOUS: Remote left rib fractures. Spondylosis with mid thoracic ankylosis. No acute osseous finding. Critical Value/emergent results were called by telephone at the time of interpretation on 05/18/2015 at 12:38 pm to Dr. Dorie Rank , who verbally acknowledged these results. Review of the MIP images confirms the above findings. IMPRESSION: Acute pulmonary embolism with CT evidence of right heart strain (RV/LV Ratio = 1.9), at least submassive and intermediate risk PE. The presence of right heart strain has been associated with an increased risk of morbidity and mortality. Please activate Code PE by paging [phone]. Electronically Signed   By: Monte Fantasia M.D.   On: 05/18/2015 12:40     STUDIES:  CTA 4/20 > images reviewed. Bilateral PE, RV/LV ratio 1.9  CULTURES:  ANTIBIOTICS:  SIGNIFICANT EVENTS: 4/20 Admit with submassive PE  LINES/TUBES:  DISCUSSION:  ASSESSMENT / PLAN: 75 year old with apparently unprovoked submassive PE. sPESi score is 0 so he is at low risk of death. He has already been started on heparin anticoagulation and is hemodynamically stable with no resp distress,  on room air. We will hold off on EKOS for now unless the echo shows significant RV dysfunction or there is a deterioration in his situation.   Issues: Submassive PE Elevated TnI, BNP and EKG changes.  Reccs: - Continue heparin anticoagulation - Trend TnI and repeat EKG - Stat echocardiogram to evaluate RV - Check LE dopplers. - Hypercoagulable work up. He may need indefinite anticoagulation of no precipitating factor is identified.  FAMILY  - Updates: Patient and daughter in law updated at bedside. - Inter-disciplinary family meet or Palliative Care meeting due by:  4/27  Marshell Garfinkel MD [facility] Pager [phone] If no answer or after 3pm call: [phone] 05/18/2015, 1:37 PM

## 2015-05-18 NOTE — ED Notes (Signed)
Patient states "i had been having some breathing problems x 2 days and went to family practice to be seen".   Patient states MD sent him to ED to be checked out.   Patient denies dizziness, weakness, chest pain, shortness of breath.   Patient states he feels fine now, but is supposed to get blood work done.   Patient has no paperwork from the doctors office.

## 2015-05-18 NOTE — Progress Notes (Signed)
  Echocardiogram 2D Echocardiogram has been performed.  Darlina Sicilian M 05/18/2015, 3:02 PM

## 2015-05-18 NOTE — Progress Notes (Signed)
Date of Visit: 05/18/2015   HPI:  Patient presents for a same day appointment to discuss dyspnea.  Reports that for the last 2 days he's had shortness of breath whenever he walks around. Denies any associated chest pain, dizziness, vomiting, swelling, diaphoresis, fever, or cough. Has not seen cardiologist recently, may have seen one a long time ago. No history of stress testing in the past. Did not want to eat much yesterday. Whenever he walks, gets shortness of breath. After sitting down for 2-3 mins, it gets better.   ROS: See HPI  [name]: history of obesity, GERD, osteoarthritis, type 2 diabetes, hyperlipidemia, hypertension, gout  PHYSICAL EXAM: BP 139/70 mmHg  Pulse 83  Temp(Src) 97.9 F (36.6 C) (Oral)  Ht '5\' 11"'$  (1.803 m)  Wt 181 lb 9.6 oz (82.373 kg)  BMI 25.34 kg/m2  SpO2 92% Gen: NAD, pleasant, cooperative HEENT: normocephalic, atraumatic, moist mucous membranes, oropharynx clear and moist. No anterior cervical or supraclavicular lymphadenopathy.  Heart: regular rate and rhythm, no murmur Lungs: clear to auscultation bilaterally, normal work of breathing, no crackles or wheezes Abdomen: soft nontender to palpation  Neuro: alert grossly nonfocal, speech normal Extremities: No appreciable lower extremity edema bilaterally   ASSESSMENT/PLAN:  75 yo M with multiple cardiac risk factors (age, diabetes, hyperlipidemia, hypertension) and no prior cardiac testing presenting with acute onset of exertional dyspnea that resolves with rest. EKG shows new T wave inversions continuously in the lateral leads (v3-v6), not seen on last EKG. No hypoxia at rest or ambulating (95-96% while ambulating).   Patient is at risk for coronary artery disease and warrants cardiac evaluation. Recommend going to the ED for labs, CXR, and monitoring. Patient and son are agreeable. Anticipate he will require admission to the hospital for further workup. Offered carelink transport to the ED but patient  preferred to be escorted by our staff in wheelchair. He denies having chest pain at present and is asymptomatic at rest, so I will hold on nitroglycerin & aspirin at this time. ED charge nurse notified and patient wheeled over to ED in wheelchair by clinic staff.    FOLLOW UP: Transporting to ED via wheelchair  [name], [place]

## 2015-05-18 NOTE — ED Notes (Signed)
[place] [phone] son

## 2015-05-18 NOTE — Progress Notes (Signed)
FPTS Interim Progress Note Patient with massive PE. He is on heparin gtt. Seen by CCM this afternoon. Per CCM note, "We will hold off on EKOS for now unless the echo shows significant RV dysfunction or there is a deterioration in his situation". Echo done and showed severely reduced RV function with PAPP of 54 mmHg. Patient hemodynamically stable.  Paged e-link and discussed this with Dr. Jimmy Footman, who suggested calling IR. So, call IR and talked with Dr. Barbie Banner. He said he will put the patient on the list for procedure and see him in the morning as he is hemodynamically stable now.  Mercy Riding, MD 05/18/2015, 11:57 PM PGY-1, [place] Medicine Service pager [phone]

## 2015-05-18 NOTE — ED Provider Notes (Addendum)
CSN: [id number]     Arrival date & time 05/18/15  1093 History   First MD Initiated Contact with Patient 05/18/15 1023     Chief Complaint  Patient presents with  . sent by MD    HPI Pt presented to the ED for evaluation of dyspnea on exertion.  He went to his doctors office today.  According to their notes which the patient confirms is accurate "Patient presents for a same day appointment to discuss dyspnea. Reports that for the last 2 days he's had shortness of breath whenever he walks around. Denies any associated chest pain, dizziness, vomiting, swelling, diaphoresis, fever, or cough. Has not seen cardiologist recently, may have seen one a long time ago. No history of stress testing in the past. Did not want to eat much yesterday. Whenever he walks, gets shortness of breath. After sitting down for 2-3 mins, it gets better." Right now in the ED he feels fine and does not feel short of breath.  He has not had any pain or discomfort with any of these episodes.   Past Medical History  Diagnosis Date  . Normal nuclear stress test 2010    stress perfusion study apparently in 2010 in [facility] which he said was negative.  . Hypertension   . Diabetes mellitus without complication ([place])   . High cholesterol    Past Surgical History  Procedure Laterality Date  . Knee surgery  2005  . Cataract extraction, bilateral  2006   Family History  Problem Relation Age of Onset  . Coronary artery disease Father   . Coronary artery disease Mother    Social History  Substance Use Topics  . Smoking status: Never Smoker   . Smokeless tobacco: Never Used  . Alcohol Use: No    Review of Systems  All other systems reviewed and are negative.     Allergies  Ace inhibitors  Home Medications   Prior to Admission medications   Medication Sig Start Date End Date Taking? Authorizing Provider  amLODipine (NORVASC) 10 MG tablet Take 1 tablet (10 mg total) by mouth at bedtime. 04/25/15  Yes  Lind Covert, MD  atenolol (TENORMIN) 50 MG tablet Take 1 tablet (50 mg total) by mouth 2 (two) times daily. 04/25/15  Yes Lind Covert, MD  hydrochlorothiazide (HYDRODIURIL) 25 MG tablet Take 1 tablet (25 mg total) by mouth daily. 04/25/15  Yes Lind Covert, MD  losartan (COZAAR) 100 MG tablet Take 1 tablet (100 mg total) by mouth daily. 04/25/15  Yes Lind Covert, MD  metFORMIN (GLUCOPHAGE) 1000 MG tablet Take 1 tablet by mouth twice a day with a meal 04/25/15  Yes Lind Covert, MD  omeprazole (PRILOSEC) 20 MG capsule Take 1 capsule (20 mg total) by mouth daily as needed. 04/25/15  Yes Lind Covert, MD  pravastatin (PRAVACHOL) 40 MG tablet Take 1 tablet (40 mg total) by mouth daily. 04/25/15  Yes Lind Covert, MD  benzonatate (TESSALON) 100 MG capsule Take 1 capsule (100 mg total) by mouth 2 (two) times daily as needed for cough. Patient not taking: Reported on 05/18/2015 03/20/15   Lind Covert, MD   BP 142/73 mmHg  Pulse 77  Temp(Src) 98.9 F (37.2 C) (Oral)  Resp 18  Ht '5\' 11"'$  (1.803 m)  Wt 82.101 kg  BMI 25.26 kg/m2  SpO2 97% Physical Exam  Constitutional: He appears well-developed and well-nourished. No distress.  HENT:  Head: Normocephalic and atraumatic.  Right  Ear: External ear normal.  Left Ear: External ear normal.  Eyes: Conjunctivae are normal. Right eye exhibits no discharge. Left eye exhibits no discharge. No scleral icterus.  Neck: Neck supple. No tracheal deviation present.  Cardiovascular: Normal rate, regular rhythm and intact distal pulses.   Pulmonary/Chest: Effort normal and breath sounds normal. No stridor. No respiratory distress. He has no wheezes. He has no rales.  Abdominal: Soft. Bowel sounds are normal. He exhibits no distension. There is no tenderness. There is no rebound and no guarding.  Musculoskeletal: He exhibits no edema or tenderness.  Neurological: He is alert. He has normal strength. No  cranial nerve deficit (no facial droop, extraocular movements intact, no slurred speech) or sensory deficit. He exhibits normal muscle tone. He displays no seizure activity. Coordination normal.  Skin: Skin is warm and dry. No rash noted.  Psychiatric: He has a normal mood and affect.  Nursing note and vitals reviewed.   ED Course  Procedures  CRITICAL CARE Performed by: WUJWJ,XBJ Total critical care time: 30 minutes Critical care time was exclusive of separately billable procedures and treating other patients. Critical care was necessary to treat or prevent imminent or life-threatening deterioration. Critical care was time spent personally by me on the following activities: development of treatment plan with patient and/or surrogate as well as nursing, discussions with consultants, evaluation of patient's response to treatment, examination of patient, obtaining history from patient or surrogate, ordering and performing treatments and interventions, ordering and review of laboratory studies, ordering and review of radiographic studies, pulse oximetry and re-evaluation of patient's condition.   Medications  aspirin chewable tablet 324 mg (324 mg Oral Given 05/18/15 1122)  IV heparin ordered at [place] Reviewed  CBC - Abnormal; Notable for the following:    Hemoglobin 11.2 (*)    HCT 36.0 (*)    MCH 25.7 (*)    RDW 17.2 (*)    All other components within normal limits  COMPREHENSIVE METABOLIC PANEL - Abnormal; Notable for the following:    Chloride 100 (*)    Glucose, Bld 146 (*)    Creatinine, Ser 1.44 (*)    Albumin 3.4 (*)    ALT 10 (*)    Total Bilirubin 1.4 (*)    GFR calc non Af Amer 46 (*)    GFR calc Af Amer 54 (*)    All other components within normal limits  PROTIME-INR - Abnormal; Notable for the following:    Prothrombin Time 15.4 (*)    All other components within normal limits  D-DIMER, QUANTITATIVE (NOT AT [facility]) - Abnormal; Notable for the  following:    D-Dimer, Quant 1.76 (*)    All other components within normal limits  BRAIN NATRIURETIC PEPTIDE - Abnormal; Notable for the following:    B Natriuretic Peptide 421.1 (*)    All other components within normal limits  I-STAT TROPOININ, ED - Abnormal; Notable for the following:    Troponin i, poc 0.21 (*)    All other components within normal limits  APTT    Imaging Review Dg Chest 2 View  05/18/2015  CLINICAL DATA:  Shortness of breath. EXAM: CHEST  2 VIEW COMPARISON:  March 15, 2014. FINDINGS: The heart size and mediastinal contours are within normal limits. Both lungs are clear. No pneumothorax or pleural effusion is noted. Old left rib fractures are noted. IMPRESSION: No active cardiopulmonary disease. Electronically Signed   By: Marijo Conception, M.D.   On: 05/18/2015 11:36  I have personally reviewed and evaluated these images and lab results as part of my medical decision-making.   EKG Interpretation   Date/Time:  Thursday May 18 2015 10:00:58 EDT Ventricular Rate:  77 PR Interval:  254 QRS Duration: 88 QT Interval:  411 QTC Calculation: 465 R Axis:   28 Text Interpretation:  Sinus rhythm Prolonged PR interval RSR' in V1 or V2,  right VCD or RVH Abnormal T, consider ischemia, diffuse leads t wave  inversion lateral leaeds resolved since last tracing Confirmed by Sheniah Supak   MD-J, Vina Byrd ([id number]) on 05/18/2015 10:10:57 AM     EKG is improved from earlier today but the t wave changes are new since prior EKGs dated a couple of years ago MDM   Final diagnoses:  Dyspnea on exertion   Patient presented to the emergency room with complaints of dyspnea on exertion.  He does have EKG changes concerning for the possibility of cardiac ischemia. He is currently not having any chest pain and he denies any shortness of breath at rest. The patient's troponin is mildly elevated. This could be associated with heart strain or possibly ischemia.  Patient also has an elevated  d-dimer. It is possible pulmonary embolism could be the source of his sx as well.  Plan on CT angiogram of the chest to evaluate for pulmonary embolism. I have ordered IV heparin to cover for possible acute coronary syndrome and/or pulmonary embolism.  I will consult with the family practice service for admission.  If the CT angio of the chest is negative cardiac consultation may be indicated.      Dorie Rank, MD 05/18/15 1156  CT scan confirms PE.  Discussed with radiology and PCCM.  Code PE activated.  VSS are stable.  Continue heparin for now.  Pulm will see the patient.  Dorie Rank, MD 05/18/15 1245

## 2015-05-18 NOTE — ED Notes (Signed)
Patient transported to X-ray 

## 2015-05-18 NOTE — ED Notes (Signed)
EKG completed and given to MD

## 2015-05-18 NOTE — Progress Notes (Signed)
Second troponin + .21, pt asymptomatic, no complaints of pain. Family medicine paged, obtaining 12-lead EKG

## 2015-05-18 NOTE — ED Notes (Signed)
Doctor at bedside.

## 2015-05-18 NOTE — ED Notes (Signed)
Attempted report x1. 

## 2015-05-19 ENCOUNTER — Inpatient Hospital Stay (HOSPITAL_COMMUNITY): Payer: PPO

## 2015-05-19 ENCOUNTER — Other Ambulatory Visit: Payer: Self-pay | Admitting: Interventional Radiology

## 2015-05-19 DIAGNOSIS — N179 Acute kidney failure, unspecified: Secondary | ICD-10-CM | POA: Diagnosis not present

## 2015-05-19 DIAGNOSIS — I2699 Other pulmonary embolism without acute cor pulmonale: Secondary | ICD-10-CM

## 2015-05-19 DIAGNOSIS — I2692 Saddle embolus of pulmonary artery without acute cor pulmonale: Secondary | ICD-10-CM

## 2015-05-19 DIAGNOSIS — I824Y1 Acute embolism and thrombosis of unspecified deep veins of right proximal lower extremity: Secondary | ICD-10-CM | POA: Diagnosis not present

## 2015-05-19 DIAGNOSIS — R0609 Other forms of dyspnea: Secondary | ICD-10-CM | POA: Diagnosis not present

## 2015-05-19 LAB — HEPARIN LEVEL (UNFRACTIONATED)
Heparin Unfractionated: 0.54 IU/mL (ref 0.30–0.70)
Heparin Unfractionated: 0.66 IU/mL (ref 0.30–0.70)

## 2015-05-19 LAB — CBC
HEMATOCRIT: 31.7 % — AB (ref 39.0–52.0)
HEMOGLOBIN: 9.9 g/dL — AB (ref 13.0–17.0)
MCH: 25.8 pg — ABNORMAL LOW (ref 26.0–34.0)
MCHC: 31.2 g/dL (ref 30.0–36.0)
MCV: 82.8 fL (ref 78.0–100.0)
Platelets: 347 10*3/uL (ref 150–400)
RBC: 3.83 MIL/uL — ABNORMAL LOW (ref 4.22–5.81)
RDW: 17.3 % — AB (ref 11.5–15.5)
WBC: 9.9 10*3/uL (ref 4.0–10.5)

## 2015-05-19 LAB — BASIC METABOLIC PANEL
ANION GAP: 12 (ref 5–15)
BUN: 16 mg/dL (ref 6–20)
CHLORIDE: 105 mmol/L (ref 101–111)
CO2: 22 mmol/L (ref 22–32)
Calcium: 8.3 mg/dL — ABNORMAL LOW (ref 8.9–10.3)
Creatinine, Ser: 1.38 mg/dL — ABNORMAL HIGH (ref 0.61–1.24)
GFR calc Af Amer: 57 mL/min — ABNORMAL LOW (ref >60)
GFR, EST NON AFRICAN AMERICAN: 49 mL/min — AB (ref >60)
Glucose, Bld: 128 mg/dL — ABNORMAL HIGH (ref 65–99)
POTASSIUM: 3.5 mmol/L (ref 3.5–5.1)
SODIUM: 139 mmol/L (ref 135–145)

## 2015-05-19 LAB — GLUCOSE, CAPILLARY
GLUCOSE-CAPILLARY: 134 mg/dL — AB (ref 65–99)
GLUCOSE-CAPILLARY: 123 mg/dL — AB (ref 65–99)
GLUCOSE-CAPILLARY: 112 mg/dL — AB (ref 65–99)
GLUCOSE-CAPILLARY: 127 mg/dL — AB (ref 65–99)

## 2015-05-19 LAB — TROPONIN I: Troponin I: 0.13 ng/mL — ABNORMAL HIGH (ref <0.031)

## 2015-05-19 MED ORDER — ATORVASTATIN CALCIUM 40 MG PO TABS
40.0000 mg | ORAL_TABLET | Freq: Every day | ORAL | Status: DC
  Administered 2015-05-19 – 2015-05-20 (×2): 40 mg via ORAL
  Filled 2015-05-19 (×2): qty 1

## 2015-05-19 MED ORDER — SODIUM CHLORIDE 0.9 % IV SOLN: 12.0000 mg | Freq: Once | INTRAVENOUS | Status: DC

## 2015-05-19 MED ORDER — HEPARIN (PORCINE) IN NACL 100-0.45 UNIT/ML-% IJ SOLN
1150.0000 [IU]/h | INTRAMUSCULAR | Status: DC
  Administered 2015-05-20: 1150 [IU]/h via INTRAVENOUS
  Filled 2015-05-19: qty 250

## 2015-05-19 NOTE — Progress Notes (Signed)
PULMONARY / CRITICAL CARE MEDICINE   Name: Arthur [name] MRN: [id number] DOB: Nov 06, 1940    ADMISSION DATE:  05/18/2015 CONSULTATION DATE:  05/18/15  REFERRING MD:  ED  CHIEF COMPLAINT:  Dyspnea on exertion, submassive PE.  HISTORY OF PRESENT ILLNESS:   Arthur [name] is a 75 year old with history of hypertension, diabetes, hyperlipidemia. He complains of acute onset of dyspnea on exertion for the past 2 days. He denies any lower extremity swelling, chest pain, palpitation, dyspnea, wheezing. He gets short of breath with minimal exertion. This is relieved on sitting down for a minute or 2. He was seen by his primary care doctor today and noted to have T-wave inversions on EKG and sent to ED for further evaluation.  In ED he remained hemodynamically stable with good sats on room air and HR in 70s. He had a positive d-dimer (1.76) and mildly elevated troponin (0.21). CT of the chest shows bilateral submassive PE with RV/LV ratio of 1.9. He traveled to [place] from November 2016 to January 2017. Otherwise he has no recent immobilization, travel history, no family history of blood clots.    SUBJECTIVE:  Alert and Oriented, Resting in bed comfortably. States dyspnea on exertion, otherwise no complaints.   VITAL SIGNS: BP 130/74 mmHg  Pulse 78  Temp(Src) 98.4 F (36.9 C) (Oral)  Resp 18  Ht '5\' 11"'$  (1.803 m)  Wt 183 lb 3.2 oz (83.099 kg)  BMI 25.56 kg/m2  SpO2 96%  HEMODYNAMICS:    VENTILATOR SETTINGS:    INTAKE / OUTPUT: I/O last 3 completed shifts: In: 480.8 [P.O.:360; I.V.:120.8] Out: 250 [Urine:250]  PHYSICAL EXAMINATION: General:  Awake, alert, oriented, no distress. Neuro:  Normocephalic, atraumatic, No gross focal deficits. HEENT:  Moist mucous membranes, no thyromegaly, JVD. Cardiovascular:  Regular rate and rhythm, no MRG Lungs:  Clear, no wheeze, crackles Abdomen:  Soft, positive bowel sounds Musculoskeletal:  Normal tone and bulk, no lower extremity swelling,  tenderness. Skin: Warm, Dry, Intact.  LABS:  BMET  Recent Labs Lab 05/18/15 1050 05/19/15 0407  NA 137 139  K 4.0 3.5  CL 100* 105  CO2 24 22  BUN 15 16  CREATININE 1.44* 1.38*  GLUCOSE 146* 128*    Electrolytes  Recent Labs Lab 05/18/15 1050 05/19/15 0407  CALCIUM 9.2 8.3*    CBC  Recent Labs Lab 05/18/15 1050 05/19/15 0407  WBC 10.4 9.9  HGB 11.2* 9.9*  HCT 36.0* 31.7*  PLT 362 347    Coag's  Recent Labs Lab 05/18/15 1050  APTT 33  INR 1.20    Sepsis Markers No results for input(s): LATICACIDVEN, PROCALCITON, O2SATVEN in the last 168 hours.  ABG No results for input(s): PHART, PCO2ART, PO2ART in the last 168 hours.  Liver Enzymes  Recent Labs Lab 05/18/15 1050  AST 18  ALT 10*  ALKPHOS 60  BILITOT 1.4*  ALBUMIN 3.4*    Cardiac Enzymes  Recent Labs Lab 05/18/15 1704 05/18/15 2311 05/19/15 0407  TROPONINI 0.23* 0.16* 0.13*    Glucose  Recent Labs Lab 05/18/15 1711 05/18/15 2015 05/19/15 0824  GLUCAP 109* 166* 134*    Imaging Dg Chest 2 View  05/18/2015  CLINICAL DATA:  Shortness of breath. EXAM: CHEST  2 VIEW COMPARISON:  March 15, 2014. FINDINGS: The heart size and mediastinal contours are within normal limits. Both lungs are clear. No pneumothorax or pleural effusion is noted. Old left rib fractures are noted. IMPRESSION: No active cardiopulmonary disease. Electronically Signed   By: Sabino Dick  Brooke Bonito, M.D.   On: 05/18/2015 11:36   Ct Angio Chest Pe W/cm &/or Wo Cm  05/18/2015  CLINICAL DATA:  Shortness of breath and dyspnea on exertion for 2-3 days. EXAM: CT ANGIOGRAPHY CHEST WITH CONTRAST TECHNIQUE: Multidetector CT imaging of the chest was performed using the standard protocol during bolus administration of intravenous contrast. Multiplanar CT image reconstructions and MIPs were obtained to evaluate the vascular anatomy. CONTRAST:  100 cc Isovue 370 intravenous COMPARISON:  05/09/2014 FINDINGS: THORACIC INLET/BODY  WALL: No acute abnormality. MEDIASTINUM: Bilateral acute pulmonary embolism with clot branching at the left main pulmonary artery and lobar/interlobar level on the right. Occlusive segmental thrombus is seen at multiple levels. Right ventricular enlargement with RV to LV ratio of 1.9. Intrahepatic venous reflux. Limited systemic arterial enhancement with no acute findings in the aorta great vessels. LUNG WINDOWS: Negative for lung infarct. Mild diffuse bronchial wall thickening in saber trachea, possible COPD. UPPER ABDOMEN: No acute finding.  Stable 18 mm right adrenal adenoma. OSSEOUS: Remote left rib fractures. Spondylosis with mid thoracic ankylosis. No acute osseous finding. Critical Value/emergent results were called by telephone at the time of interpretation on 05/18/2015 at 12:38 pm to Dr. Dorie Rank , who verbally acknowledged these results. Review of the MIP images confirms the above findings. IMPRESSION: Acute pulmonary embolism with CT evidence of right heart strain (RV/LV Ratio = 1.9), at least submassive and intermediate risk PE. The presence of right heart strain has been associated with an increased risk of morbidity and mortality. Please activate Code PE by paging [phone]. Electronically Signed   By: Monte Fantasia M.D.   On: 05/18/2015 12:40     STUDIES:  CTA 4/20 > images reviewed. Bilateral PE, RV/LV ratio 1.9 Echo 4/20 > EF 55-60%, right ventricle mildly dilated with severely reduced function, PA pressure 54, systolic pressure moderately to severely increased   CULTURES:  ANTIBIOTICS:  SIGNIFICANT EVENTS: 4/20 Admit with submassive PE 4/21 IR for EKOS   LINES/TUBES:  DISCUSSION:  ASSESSMENT / PLAN: Submassive PE w/ RV strain by ECHO DVT 74 year old with apparently unprovoked submassive PE. sPESi score is 0 so he is at low risk of death but ECHO revealed severe right ventricle dilation with severely reduced systolic function; Elevated TnI, BNP and EKG changes BUT he  feels "100% better" and is hemodymanic and clinically stable. We think would be best to wait on intervention given his improvement. This was discussed at length w/ the pt and family who agree  Recs: - cont heparin  - cont watch in SDU; if decompensates will call IR back  - Follow up EKG in am  - LE dopplers pending   - He may need indefinite anticoagulation of no precipitating factor is identified.  FAMILY  - Updates: Patient and daughter in law updated at bedside on 4/20  - Inter-disciplinary family meet or Palliative Care meeting due by:  4/27  Erick Colace ACNP-BC [employer] Pager # [phone] OR # [phone] if no answer

## 2015-05-19 NOTE — Progress Notes (Addendum)
Preliminary results by tech - Venous Duplex Lower Ext. Completed. Positive for acute deep vein thrombosis involving the proximal posterior tibial veins and soleal vein in the right leg. Left leg, negative for deep and superficial vein thrombosis. Results given to [facility], patient's nurse.  Oda Cogan, BS, RDMS, RVT

## 2015-05-19 NOTE — Consult Note (Signed)
Chief Complaint: Patient was seen in consultation today for PE thrombolysis Chief Complaint  Patient presents with  . sent by MD    at the request of Dr Benjamine Mola Deterding And Dr Marshell Garfinkel  Referring Physician(s): Dr Marshell Garfinkel  Supervising Physician: Arne Cleveland  History of Present Illness: Arthur [name] is a 75 y.o. male   Dyspnea x few days Difficult to breathe just after walking short way To ED 4/20 Work up reveals  CTA: IMPRESSION: Acute pulmonary embolism with CT evidence of right heart strain (RV/LV Ratio = 1.9), at least submassive and intermediate risk PE. The presence of right heart strain has been associated with an increased risk of morbidity and mortality. Please activate Code PE by paging [phone].  Echo: Impressions:  - Technically difficult; normal LV systolic function; grade 1  diastolic dysfunction; mild RAE/RVE; severely reduced RV  function; mild TR with moderate to severe elevation in pulmonary  pressure. EKG: Sinus rhythm Prolonged PR interval RSR' in V1 or V2, right VCD or RVH Abnormal T, consider ischemia, diffuse leads t wave inversion lateral leaeds resolved since last tracing Confirmed by KNAPP MD-J, JON ([id number]) on 05/18/2015   Now request for EKOS thrombolysis Chart and imaging reviewed with Dr Valentina Shaggy procedure  Pt denies CVA; or recent surgery Troponin slight elevation-- trending down Heparin drip started last pm   Past Medical History  Diagnosis Date  . Normal nuclear stress test 2010    stress perfusion study apparently in 2010 in [facility] which he said was negative.  . Hypertension   . Diabetes mellitus without complication ([place])   . High cholesterol     Past Surgical History  Procedure Laterality Date  . Knee surgery  2005  . Cataract extraction, bilateral  2006    Allergies: Ace inhibitors  Medications: Prior to Admission medications   Medication Sig Start Date  End Date Taking? Authorizing Provider  amLODipine (NORVASC) 10 MG tablet Take 1 tablet (10 mg total) by mouth at bedtime. 04/25/15  Yes Lind Covert, MD  atenolol (TENORMIN) 50 MG tablet Take 1 tablet (50 mg total) by mouth 2 (two) times daily. 04/25/15  Yes Lind Covert, MD  hydrochlorothiazide (HYDRODIURIL) 25 MG tablet Take 1 tablet (25 mg total) by mouth daily. 04/25/15  Yes Lind Covert, MD  losartan (COZAAR) 100 MG tablet Take 1 tablet (100 mg total) by mouth daily. 04/25/15  Yes Lind Covert, MD  metFORMIN (GLUCOPHAGE) 1000 MG tablet Take 1 tablet by mouth twice a day with a meal 04/25/15  Yes Lind Covert, MD  omeprazole (PRILOSEC) 20 MG capsule Take 1 capsule (20 mg total) by mouth daily as needed. 04/25/15  Yes Lind Covert, MD  pravastatin (PRAVACHOL) 40 MG tablet Take 1 tablet (40 mg total) by mouth daily. 04/25/15  Yes Lind Covert, MD  benzonatate (TESSALON) 100 MG capsule Take 1 capsule (100 mg total) by mouth 2 (two) times daily as needed for cough. Patient not taking: Reported on 05/18/2015 03/20/15   Lind Covert, MD     Family History  Problem Relation Age of Onset  . Coronary artery disease Father   . Coronary artery disease Mother     Social History   Social History  . Marital Status: Single    Spouse Name: N/A  . Number of Children: N/A  . Years of Education: N/A   Social History Main Topics  . Smoking status: Never Smoker   .  Smokeless tobacco: Never Used  . Alcohol Use: No  . Drug Use: No  . Sexual Activity: Not Asked   Other Topics Concern  . None   Social History Narrative   Social History:   He works part-time as a Doctor, general practice. He is married with 3 children. He's never smoked cigarettes and does not drink alcohol.              Review of Systems: A 12 point ROS discussed and pertinent positives are indicated in the HPI above.  All other systems are negative.  Review of Systems  Constitutional:  Positive for activity change and fatigue. Negative for fever, diaphoresis and appetite change.  Respiratory: Positive for chest tightness and shortness of breath.   Cardiovascular: Negative for chest pain and leg swelling.  Gastrointestinal: Negative for abdominal pain.  Musculoskeletal: Negative for gait problem.  Skin: Negative for color change and pallor.  Neurological: Negative for dizziness, weakness and light-headedness.  Psychiatric/Behavioral: Negative for behavioral problems and confusion.    Vital Signs: BP 130/74 mmHg  Pulse 78  Temp(Src) 98.1 F (36.7 C) (Oral)  Resp 19  Ht '5\' 11"'$  (1.803 m)  Wt 183 lb 3.2 oz (83.099 kg)  BMI 25.56 kg/m2  SpO2 95%  Physical Exam  Constitutional: He is oriented to person, place, and time. He appears well-nourished.  Cardiovascular: Normal rate, regular rhythm and normal heart sounds.   Pulmonary/Chest: Effort normal and breath sounds normal. He has no wheezes.  Abdominal: Bowel sounds are normal.  Musculoskeletal: Normal range of motion.  Neurological: He is alert and oriented to person, place, and time.  Skin: Skin is warm and dry.  Psychiatric: He has a normal mood and affect. His behavior is normal. Judgment and thought content normal.  Nursing note and vitals reviewed.   Mallampati Score:  MD Evaluation Airway: WNL Heart: WNL Abdomen: WNL Chest/ Lungs: WNL ASA  Classification: 2 Mallampati/Airway Score: Two  Imaging: Dg Chest 2 View  05/18/2015  CLINICAL DATA:  Shortness of breath. EXAM: CHEST  2 VIEW COMPARISON:  March 15, 2014. FINDINGS: The heart size and mediastinal contours are within normal limits. Both lungs are clear. No pneumothorax or pleural effusion is noted. Old left rib fractures are noted. IMPRESSION: No active cardiopulmonary disease. Electronically Signed   By: Marijo Conception, M.D.   On: 05/18/2015 11:36   Ct Angio Chest Pe W/cm &/or Wo Cm  05/18/2015  CLINICAL DATA:  Shortness of breath and dyspnea  on exertion for 2-3 days. EXAM: CT ANGIOGRAPHY CHEST WITH CONTRAST TECHNIQUE: Multidetector CT imaging of the chest was performed using the standard protocol during bolus administration of intravenous contrast. Multiplanar CT image reconstructions and MIPs were obtained to evaluate the vascular anatomy. CONTRAST:  100 cc Isovue 370 intravenous COMPARISON:  05/09/2014 FINDINGS: THORACIC INLET/BODY WALL: No acute abnormality. MEDIASTINUM: Bilateral acute pulmonary embolism with clot branching at the left main pulmonary artery and lobar/interlobar level on the right. Occlusive segmental thrombus is seen at multiple levels. Right ventricular enlargement with RV to LV ratio of 1.9. Intrahepatic venous reflux. Limited systemic arterial enhancement with no acute findings in the aorta great vessels. LUNG WINDOWS: Negative for lung infarct. Mild diffuse bronchial wall thickening in saber trachea, possible COPD. UPPER ABDOMEN: No acute finding.  Stable 18 mm right adrenal adenoma. OSSEOUS: Remote left rib fractures. Spondylosis with mid thoracic ankylosis. No acute osseous finding. Critical Value/emergent results were called by telephone at the time of interpretation on 05/18/2015 at 12:38 pm to  Dr. Dorie Rank , who verbally acknowledged these results. Review of the MIP images confirms the above findings. IMPRESSION: Acute pulmonary embolism with CT evidence of right heart strain (RV/LV Ratio = 1.9), at least submassive and intermediate risk PE. The presence of right heart strain has been associated with an increased risk of morbidity and mortality. Please activate Code PE by paging [phone]. Electronically Signed   By: Monte Fantasia M.D.   On: 05/18/2015 12:40    Labs:  CBC:  Recent Labs  05/18/15 1050 05/19/15 0407  WBC 10.4 9.9  HGB 11.2* 9.9*  HCT 36.0* 31.7*  PLT 362 347    COAGS:  Recent Labs  05/18/15 1050  INR 1.20  APTT 33    BMP:  Recent Labs  03/20/15 1033 05/18/15 1050  05/19/15 0407  NA 136 137 139  K 4.3 4.0 3.5  CL 99 100* 105  CO2 '25 24 22  '$ GLUCOSE 137* 146* 128*  BUN '18 15 16  '$ CALCIUM 9.5 9.2 8.3*  CREATININE 1.23* 1.44* 1.38*  GFRNONAA  --  46* 49*  GFRAA  --  54* 57*    LIVER FUNCTION TESTS:  Recent Labs  03/20/15 1033 05/18/15 1050  BILITOT 0.6 1.4*  AST 11 18  ALT 8* 10*  ALKPHOS 65 60  PROT 7.6 7.6  ALBUMIN 4.0 3.4*    TUMOR MARKERS: No results for input(s): AFPTM, CEA, CA199, CHROMGRNA in the last 8760 hours.  Assessment and Plan:  B PE with right heart strain Scheduled for Pulmonary embolus EKOS thrombolysis Cr 1.38 today Risks and Benefits discussed with the patient and family including, but not limited to bleeding, possible life threatening bleeding and need for blood product transfusion, vascular injury, stroke, contrast induced renal failure and infection. All of the patient's questions were answered, patient and family are agreeable to proceed. Consent signed and in chart.    Thank you for this interesting consult.  I greatly enjoyed meeting Arthur [name] and look forward to participating in their care.  A copy of this report was sent to the requesting provider on this date.  Electronically Signed: Monia Sabal A 05/19/2015, 8:46 AM   I spent a total of 40 Minutes    in face to face in clinical consultation, greater than 50% of which was counseling/coordinating care for PE thrombolysis

## 2015-05-19 NOTE — Progress Notes (Addendum)
ANTICOAGULATION CONSULT NOTE - Follow Up Consult  Pharmacy Consult:  Heparin Indication:  New PE + DVT  Allergies  Allergen Reactions  . Ace Inhibitors     REACTION: Cough    Patient Measurements: Height: '5\' 11"'$  (180.3 cm) Weight: 183 lb 3.2 oz (83.099 kg) IBW/kg (Calculated) : 75.3 Heparin Dosing Weight: 82 kg  Vital Signs: Temp: 98.4 F (36.9 C) (04/21 0844) Temp Source: Oral (04/21 0844) BP: 130/74 mmHg (04/21 0844) Pulse Rate: 78 (04/21 0844)  Labs:  Recent Labs  05/18/15 1050 05/18/15 1704 05/18/15 2100 05/18/15 2311 05/19/15 0407 05/19/15 1303  HGB 11.2*  --   --   --  9.9*  --   HCT 36.0*  --   --   --  31.7*  --   PLT 362  --   --   --  347  --   APTT 33  --   --   --   --   --   LABPROT 15.4*  --   --   --   --   --   INR 1.20  --   --   --   --   --   HEPARINUNFRC  --   --  0.79*  --  0.54 0.66  CREATININE 1.44*  --   --   --  1.38*  --   TROPONINI  --  0.23*  --  0.16* 0.13*  --     Estimated Creatinine Clearance: 50 mL/min (by C-G formula based on Cr of 1.38).     Assessment: 74 YOM continues on IV heparin for new PE and DVT.  Heparin level remains therapeutic but trending up; no bleeding reported.   Goal of Therapy:  Heparin level 0.3-0.7 units/ml Monitor platelets by anticoagulation protocol: Yes    Plan:  - Reduce heparin gtt slightly to 1150 units/hr - Daily HL / CBC - F/U with long-term [facility] plan   Samanatha Brammer D. Mina Marble, PharmD, BCPS Pager:  [phone] 05/19/2015, 2:13 PM

## 2015-05-19 NOTE — Progress Notes (Signed)
[place] for heparin Indication: pulmonary embolus  Allergies  Allergen Reactions  . Ace Inhibitors     REACTION: Cough    Patient Measurements: Height: '5\' 11"'$  (180.3 cm) Weight: 183 lb 3.2 oz (83.099 kg) IBW/kg (Calculated) : 75.3 Heparin Dosing Weight: 82kg  Vital Signs: Temp: 98.1 F (36.7 C) (04/21 0334) Temp Source: Oral (04/20 2319) BP: 128/72 mmHg (04/20 2355) Pulse Rate: 82 (04/20 2355)  Labs:  Recent Labs  05/18/15 1050 05/18/15 1704 05/18/15 2100 05/18/15 2311 05/19/15 0407  HGB 11.2*  --   --   --  9.9*  HCT 36.0*  --   --   --  31.7*  PLT 362  --   --   --  347  APTT 33  --   --   --   --   LABPROT 15.4*  --   --   --   --   INR 1.20  --   --   --   --   HEPARINUNFRC  --   --  0.79*  --  0.54  CREATININE 1.44*  --   --   --  1.38*  TROPONINI  --  0.23*  --  0.16* 0.13*    Estimated Creatinine Clearance: 50 mL/min (by C-G formula based on Cr of 1.38).   Assessment: 47 yom on heparin for acute PE with right heart strain. Heparin level therapeutic on 1200 units/hr. Hgb trending down, plt ok. No bleeding noted.  Goal of Therapy:  Heparin level 0.3-0.7 units/ml Monitor platelets by anticoagulation protocol: Yes   Plan:  Continue heparin to 1200 units / hr  F/u 6 hr confirmatory heparin level  Sherlon Handing, PharmD, BCPS Clinical pharmacist, pager [phone] 05/19/2015,5:48 AM

## 2015-05-19 NOTE — Care Management Note (Signed)
Case Management Note  Patient Details  Name: Arthur [name] MRN: [id number] Date of Birth: Jun 25, 1940  Subjective/Objective:  Pt presenting with dyspnea on exertion. Positive for PE. Initiated on IV heparin gtt.                 Action/Plan: CM will continue to monitor for disposition needs.    Expected Discharge Date:                  Expected Discharge Plan:  Home/Self Care  In-House Referral:  NA  Discharge planning Services  CM Consult  Post Acute Care Choice:    Choice offered to:     DME Arranged:    DME Agency:     HH Arranged:    HH Agency:     Status of Service:  In process, will continue to follow  Medicare Important Message Given:    Date Medicare IM Given:    Medicare IM give by:    Date Additional Medicare IM Given:    Additional Medicare Important Message give by:     If discussed at [place] of Stay Meetings, dates discussed:    Additional Comments:  Bethena Roys, RN 05/19/2015, 10:05 AM

## 2015-05-19 NOTE — Progress Notes (Signed)
Family Medicine Teaching Service Daily Progress Note Intern Pager: [phone]  Patient name: Arthur [name] Medical record number: [id number] Date of birth: 1940-03-22 Age: 75 y.o. Gender: male  Primary Care Provider: Lind Covert, MD Consultants: CCM, IR  Code Status: FULL   Pt Overview and Major Events to Date:  4/20: Patient admitted with PE, Echo showed severely reduced RV function  4/21: IR planning to do EKOS   Assessment and Plan: Arthur [name] is a 75 y.o. male presenting with dyspnea on exertion. PMH is significant for HTN, T2DM, GERD, gout, OA, and HLD.   Dyspnea/ PE: Patient presenting with DOE without any associated symptoms. CTA chest showed acute pulmonary embolism with evidence of right heart strain. EKG with new T wave inversions in the lateral leads, V3-6. iStat Troponin elevated to 0.21 in the ED. Will proceed with ACS r/o as potential cause of dyspnea.   -CCM consulted, appreciate recs  -continue heparin gtt -EKG in AM  -trend troponins  -consider cardiology consult given elevate troponin  -echo with severely reduced RV function, IR planning for EKOS today  -LE dopplers pending  -Will discuss options for oral anticoagulation  AKI: Cr mildly elevated to 1.44 (bl 1.1). Likely pre-renal as BUN:Cr <20.  -IVF NS at 125 cc/hr for 12 hours  -BMET in AM : Cr 1.38   HTN: Normotensive at admission.  -continue home amlodipine 10 mg, atenolol 50 mg BID, HCTZ 25 mg daily, and Losartan 100 mg daily   T2DM: Well controlled. Last HgB A1c 6.8 (2/17).  -hold metformin given AKI  -sensitive SSI   HLD: Last lipid panel (2/17): Cholesterol 163, Triglycerides 152, HDL 43, LDL 90.  -continue pravastatin 40 mg daily   FEN/GI: Carb Modified Diet, NS at 125 cc/hr for 12 hours  Prophylaxis: Heparin for VTE treatment   Disposition: Pending clinical improvement   Subjective:  Doing well this morning. No issues with breathing. Good spirits.    Objective: Temp:  [97.9 F (36.6 C)-98.9 F (37.2 C)] 98.1 F (36.7 C) (04/21 0334) Pulse Rate:  [72-88] 82 (04/20 2355) Resp:  [16-24] 19 (04/20 2355) BP: (101-149)/(70-79) 128/72 mmHg (04/20 2355) SpO2:  [91 %-100 %] 95 % (04/20 2355) Weight:  [180 lb 5.4 oz (81.8 kg)-183 lb 3.2 oz (83.099 kg)] 183 lb 3.2 oz (83.099 kg) (04/21 0334) Physical Exam: General: appears younger than stated age, lying in bed, NAD  Cardiovascular: RRR. No murmurs appreciated.  Respiratory: CTAB. Normal WOB.  Abdomen: +BS, soft, NTND  Extremities: No LE edema.   Laboratory:  Recent Labs Lab 05/18/15 1050 05/19/15 0407  WBC 10.4 9.9  HGB 11.2* 9.9*  HCT 36.0* 31.7*  PLT 362 347    Recent Labs Lab 05/18/15 1050 05/19/15 0407  NA 137 139  K 4.0 3.5  CL 100* 105  CO2 24 22  BUN 15 16  CREATININE 1.44* 1.38*  CALCIUM 9.2 8.3*  PROT 7.6  --   BILITOT 1.4*  --   ALKPHOS 60  --   ALT 10*  --   AST 18  --   GLUCOSE 146* 128*     Imaging/Diagnostic Tests: Dg Chest 2 View  05/18/2015  CLINICAL DATA:  Shortness of breath. EXAM: CHEST  2 VIEW COMPARISON:  March 15, 2014. FINDINGS: The heart size and mediastinal contours are within normal limits. Both lungs are clear. No pneumothorax or pleural effusion is noted. Old left rib fractures are noted. IMPRESSION: No active cardiopulmonary disease. Electronically Signed   By: Jeneen Rinks  Murlean Caller, M.D.   On: 05/18/2015 11:36   Ct Angio Chest Pe W/cm &/or Wo Cm  05/18/2015  CLINICAL DATA:  Shortness of breath and dyspnea on exertion for 2-3 days. EXAM: CT ANGIOGRAPHY CHEST WITH CONTRAST TECHNIQUE: Multidetector CT imaging of the chest was performed using the standard protocol during bolus administration of intravenous contrast. Multiplanar CT image reconstructions and MIPs were obtained to evaluate the vascular anatomy. CONTRAST:  100 cc Isovue 370 intravenous COMPARISON:  05/09/2014 FINDINGS: THORACIC INLET/BODY WALL: No acute abnormality. MEDIASTINUM:  Bilateral acute pulmonary embolism with clot branching at the left main pulmonary artery and lobar/interlobar level on the right. Occlusive segmental thrombus is seen at multiple levels. Right ventricular enlargement with RV to LV ratio of 1.9. Intrahepatic venous reflux. Limited systemic arterial enhancement with no acute findings in the aorta great vessels. LUNG WINDOWS: Negative for lung infarct. Mild diffuse bronchial wall thickening in saber trachea, possible COPD. UPPER ABDOMEN: No acute finding.  Stable 18 mm right adrenal adenoma. OSSEOUS: Remote left rib fractures. Spondylosis with mid thoracic ankylosis. No acute osseous finding. Critical Value/emergent results were called by telephone at the time of interpretation on 05/18/2015 at 12:38 pm to Dr. Dorie Rank , who verbally acknowledged these results. Review of the MIP images confirms the above findings. IMPRESSION: Acute pulmonary embolism with CT evidence of right heart strain (RV/LV Ratio = 1.9), at least submassive and intermediate risk PE. The presence of right heart strain has been associated with an increased risk of morbidity and mortality. Please activate Code PE by paging [phone]. Electronically Signed   By: Monte Fantasia M.D.   On: 05/18/2015 12:40    Nicolette Bang, DO 05/19/2015, 7:28 AM PGY-1, [place] Intern pager: [phone], text pages welcome

## 2015-05-20 DIAGNOSIS — I2699 Other pulmonary embolism without acute cor pulmonale: Secondary | ICD-10-CM | POA: Diagnosis not present

## 2015-05-20 DIAGNOSIS — N179 Acute kidney failure, unspecified: Secondary | ICD-10-CM | POA: Diagnosis not present

## 2015-05-20 DIAGNOSIS — I2692 Saddle embolus of pulmonary artery without acute cor pulmonale: Secondary | ICD-10-CM | POA: Diagnosis not present

## 2015-05-20 DIAGNOSIS — I824Y1 Acute embolism and thrombosis of unspecified deep veins of right proximal lower extremity: Secondary | ICD-10-CM | POA: Insufficient documentation

## 2015-05-20 DIAGNOSIS — R0609 Other forms of dyspnea: Secondary | ICD-10-CM | POA: Diagnosis not present

## 2015-05-20 LAB — GLUCOSE, CAPILLARY
GLUCOSE-CAPILLARY: 131 mg/dL — AB (ref 65–99)
GLUCOSE-CAPILLARY: 155 mg/dL — AB (ref 65–99)
GLUCOSE-CAPILLARY: 198 mg/dL — AB (ref 65–99)
Glucose-Capillary: 143 mg/dL — ABNORMAL HIGH (ref 65–99)

## 2015-05-20 LAB — HEPARIN LEVEL (UNFRACTIONATED): Heparin Unfractionated: 0.48 IU/mL (ref 0.30–0.70)

## 2015-05-20 MED ORDER — RIVAROXABAN 15 MG PO TABS: 15.0000 mg | ORAL_TABLET | Freq: Two times a day (BID) | ORAL | Status: DC

## 2015-05-20 MED ORDER — RIVAROXABAN 15 MG PO TABS
15.0000 mg | ORAL_TABLET | Freq: Two times a day (BID) | ORAL | Status: DC
  Administered 2015-05-20 – 2015-05-21 (×3): 15 mg via ORAL
  Filled 2015-05-20 (×3): qty 1

## 2015-05-20 MED ORDER — RIVAROXABAN (XARELTO) EDUCATION KIT FOR DVT/PE PATIENTS
PACK | Freq: Once | Status: AC
  Administered 2015-05-20: 11:00:00
  Filled 2015-05-20: qty 1

## 2015-05-20 NOTE — Progress Notes (Signed)
Patient ambulated in hall on [place] remained in the 90's.  Patient complained of shortness of breath and stated he did not feel well enough to go home to day.  I notified MD, discharge order cancelled.

## 2015-05-20 NOTE — Discharge Summary (Signed)
[facility] Discharge Summary  Patient name: Arthur [name] Medical record number: [id number] Date of birth: 01-09-1941 Age: 75 y.o. Gender: male Date of Admission: 05/18/2015  Date of Discharge: 05/20/2015 Admitting Physician: Lupita Dawn, MD  Primary Care Provider: Lind Covert, MD Consultants: Pulmonology, interventional radiology  Indication for Hospitalization: dyspnea on exertion found to have submassive PE with right heart strain  Discharge Diagnoses/Problem List:  Submassive PE with right heart strain (RV/LV 1.9) acute right posterior tibial/soleal vein DVT AKI T2DM, well-controlled HTN: well controlled on 4 drug regimen HLD on pravastatin  Disposition: Discharged home  Discharge Condition: Stable, improved  Discharge Exam:  BP 138/71 mmHg  Pulse 74  Temp(Src) 98.1 F (36.7 C) (Oral)  Resp 20  Ht '5\' 11"'$  (1.803 m)  Wt 181 lb 3.2 oz (82.192 kg)  BMI 25.28 kg/m2  SpO2 100% Gen: Very well-appearing elderly gentleman ambulating CV: Regular rate, no murmur; radial, no LE edema, no JVD, neg homan's Pulm: Non-labored breathing ambient air, clear breath sounds  Brief Hospital Course:  Arthur [name] is a 75 y.o. male presenting on 4/20 with dyspnea on exertion found to have pulmonary embolus on CTA chest.   Aside from 13-hour flight 3 months prior, there are no provoking factors or history of clot. He had evidence of acute RLE DVTs as well. Pulmonology was consulted due to radiologic evidence of right heart strain (RV/LV 1.9). They consulted IR for evaluation for EKOS (intra-arterial thrombolysis), though the patient's dramatic clinical improvement on heparin alone suggested limited benefit, and this was not performed. He continued to improve over the following days, ambulating regularly with only mild dyspnea on day of discharge. Heparin was transitioned to xarelto '20mg'$  BID prior to discharge. Pulmonology plans to follow up in the  office following discharge.   Issues for Follow Up:  1. f/u xarelto compliance. Will switch to '20mg'$  daily dose on 5/13 (after 21 days of '15mg'$  BID).  2. Recommend follow up BMP to assess resolution of AKI. CrCl was estimated 46m/min in setting of AKI, so full dose xarelto was prescribed. The Beers Criteria recommends to reduce the dose in older adults ?65 years with a CrCl between 30 to 50 mL/minute (specific dosage adjustment not provided).  Significant Procedures: None  Significant Labs and Imaging:   Recent Labs Lab 05/18/15 1050 05/19/15 0407  WBC 10.4 9.9  HGB 11.2* 9.9*  HCT 36.0* 31.7*  PLT 362 347    Recent Labs Lab 05/18/15 1050 05/19/15 0407  NA 137 139  K 4.0 3.5  CL 100* 105  CO2 24 22  GLUCOSE 146* 128*  BUN 15 16  CREATININE 1.44* 1.38*  CALCIUM 9.2 8.3*  ALKPHOS 60  --   AST 18  --   ALT 10*  --   ALBUMIN 3.4*  --    CTA Chest 4/20: Acute pulmonary embolism with CT evidence of right heart strain (RV/LV Ratio = 1.9), at least submassive and intermediate risk PE.  Vascular U/S lower extremities 4/21:  - Findings consistent with acute deep vein thrombosis involving the  right posterial tibial vein and right soleal vein. - No evidence of deep vein thrombosis involving the left lower  extremity.  ECHO 4/20: - Left ventricle: The cavity size was normal. There was mild focal  basal hypertrophy of the septum. Systolic function was normal.  The estimated ejection fraction was in the range of 55% to 60%.  Wall motion was normal; there were no regional wall motion  abnormalities. Doppler parameters are consistent with abnormal  left ventricular relaxation (grade 1 diastolic dysfunction). - Ventricular septum: The contour showed diastolic flattening and  systolic flattening. - Right ventricle: The cavity size was mildly dilated. Systolic  function was severely reduced. - Right atrium: The atrium was mildly dilated. - Pulmonary arteries: Systolic  pressure was moderately to severely  increased. PA peak pressure: 54 mm Hg (S).  Results/Tests Pending at Time of Discharge: None  Discharge Medications:    Medication List    ASK your doctor about these medications        amLODipine 10 MG tablet  Commonly known as:  NORVASC  Take 1 tablet (10 mg total) by mouth at bedtime.     atenolol 50 MG tablet  Commonly known as:  TENORMIN  Take 1 tablet (50 mg total) by mouth 2 (two) times daily.     benzonatate 100 MG capsule  Commonly known as:  TESSALON  Take 1 capsule (100 mg total) by mouth 2 (two) times daily as needed for cough.     hydrochlorothiazide 25 MG tablet  Commonly known as:  HYDRODIURIL  Take 1 tablet (25 mg total) by mouth daily.     losartan 100 MG tablet  Commonly known as:  COZAAR  Take 1 tablet (100 mg total) by mouth daily.     metFORMIN 1000 MG tablet  Commonly known as:  GLUCOPHAGE  Take 1 tablet by mouth twice a day with a meal     omeprazole 20 MG capsule  Commonly known as:  PRILOSEC  Take 1 capsule (20 mg total) by mouth daily as needed.     pravastatin 40 MG tablet  Commonly known as:  PRAVACHOL  Take 1 tablet (40 mg total) by mouth daily.        Discharge Instructions: Please refer to Patient Instructions section of EMR for full details.  Patient was counseled important signs and symptoms that should prompt return to medical care, changes in medications, dietary instructions, activity restrictions, and follow up appointments.   Follow-Up Appointments: Follow-up Information    Follow up with Lind Covert, MD On 05/24/2015.   Specialty:  Family Medicine   Why:  10:45am   Contact information:   [place] [address] [phone]       [name], MD 05/20/2015, 9:44 AM PGY-3, [place]

## 2015-05-20 NOTE — Care Management Note (Signed)
Case Management Note  Patient Details  Name: Arthur [name] MRN: [id number] Date of Birth: 07/12/1940  Subjective/Objective:                  PE  Action/Plan: 30 day free Xarelto card given to patient.   Expected Discharge Date:                  Expected Discharge Plan:  Home/Self Care  In-House Referral:  NA  Discharge planning Services  CM Consult  Post Acute Care Choice:    Choice offered to:     DME Arranged:    DME Agency:     HH Arranged:    HH Agency:     Status of Service:  In process, will continue to follow  Medicare Important Message Given:    Date Medicare IM Given:    Medicare IM give by:    Date Additional Medicare IM Given:    Additional Medicare Important Message give by:     If discussed at [place] of Stay Meetings, dates discussed:    Additional Comments:  Apolonio Schneiders, RN 05/20/2015, 12:44 PM

## 2015-05-20 NOTE — Progress Notes (Addendum)
PULMONARY / CRITICAL CARE MEDICINE   Name: Arthur [name] MRN: [id number] DOB: 05-Jul-1940    ADMISSION DATE:  05/18/2015 CONSULTATION DATE:  05/18/15  REFERRING MD:  ED  CHIEF COMPLAINT:  Dyspnea on exertion, submassive PE.  HISTORY OF PRESENT ILLNESS:   Arthur [name] is a 75 year old with history of hypertension, diabetes, hyperlipidemia. He complains of acute onset of dyspnea on exertion for  2 days pta. He denies any lower extremity swelling, chest pain, palpitation, dyspnea, wheezing. He gets short of breath with minimal exertion. This is relieved on sitting down for a minute or 2. He was seen by his primary care doctor today and noted to have T-wave inversions on EKG and sent to ED for further evaluation.  In ED he remained hemodynamically stable with good sats on room air and HR in 70s. He had a positive d-dimer (1.76) and mildly elevated troponin (0.21). CT of the chest showed bilateral submassive PE with RV/LV ratio of 1.9. He traveled to [place] from November 2016 to January 2017. Otherwise he has no recent immobilization, travel history, no family history of blood clots.    SUBJECTIVE:  Alert and Oriented, Resting in bed comfortably. States dyspnea on exertion, otherwise no complaints.   VITAL SIGNS: BP 138/60 mmHg  Pulse 74  Temp(Src) 98.1 F (36.7 C) (Oral)  Resp 20  Ht '5\' 11"'$  (1.803 m)  Wt 181 lb 3.2 oz (82.192 kg)  BMI 25.28 kg/m2  SpO2 100% RA HEMODYNAMICS:    VENTILATOR SETTINGS:    INTAKE / OUTPUT: I/O last 3 completed shifts: In: 1266.5 [P.O.:840; I.V.:426.5] Out: 1300 [Urine:1300]  PHYSICAL EXAMINATION: General:  Awake, alert, oriented, no distress. Neuro:  Normocephalic, atraumatic, No gross focal deficits. HEENT:  Moist mucous membranes, no thyromegaly, JVD. Cardiovascular:  Regular rate and rhythm, no MRG Lungs:  Clear, no wheeze, crackles Abdomen:  Soft, positive bowel sounds Musculoskeletal:  Normal tone and bulk, no lower extremity swelling,  tenderness. Skin: Warm, Dry, Intact.  LABS:  BMET  Recent Labs Lab 05/18/15 1050 05/19/15 0407  NA 137 139  K 4.0 3.5  CL 100* 105  CO2 24 22  BUN 15 16  CREATININE 1.44* 1.38*  GLUCOSE 146* 128*    Electrolytes  Recent Labs Lab 05/18/15 1050 05/19/15 0407  CALCIUM 9.2 8.3*    CBC  Recent Labs Lab 05/18/15 1050 05/19/15 0407  WBC 10.4 9.9  HGB 11.2* 9.9*  HCT 36.0* 31.7*  PLT 362 347    Coag's  Recent Labs Lab 05/18/15 1050  APTT 33  INR 1.20    Sepsis Markers No results for input(s): LATICACIDVEN, PROCALCITON, O2SATVEN in the last 168 hours.  ABG No results for input(s): PHART, PCO2ART, PO2ART in the last 168 hours.  Liver Enzymes  Recent Labs Lab 05/18/15 1050  AST 18  ALT 10*  ALKPHOS 60  BILITOT 1.4*  ALBUMIN 3.4*    Cardiac Enzymes  Recent Labs Lab 05/18/15 1704 05/18/15 2311 05/19/15 0407  TROPONINI 0.23* 0.16* 0.13*    Glucose  Recent Labs Lab 05/19/15 0824 05/19/15 1148 05/19/15 1623 05/19/15 2116 05/20/15 0806 05/20/15 1207  GLUCAP 134* 123* 112* 127* 131* 155*    Imaging No results found.   STUDIES:  CTA 4/20 > images reviewed. Bilateral PE, RV/LV ratio 1.9 Echo 4/20 > EF 55-60%, right ventricle mildly dilated with severely reduced function, PA pressure 54, systolic pressure moderately to severely increased Venous doppler 4/21 Pos on R DVT R post tib/ R soleal vein  SIGNIFICANT EVENTS: 4/20 Admit with submassive PE 4/21 IR for EKOS > not done      ASSESSMENT / PLAN: Submassive PE w/ RV strain by ECHO DVT 75 year old with apparently unprovoked submassive PE. sPESi score is 0 so he is at low risk of death but ECHO revealed severe right ventricle dilation with severely reduced systolic function; Elevated TnI, BNP and EKG changes BUT he feels "100% better" and is hemodymanic and clinically stable. And improved on Hep since admit   Recs: - cont heparin   - He may need indefinite anticoagulation  of no precipitating factor is identified.     Discussed with fm at bedside one of whom is RN - he took coumadin previously peri op from ortho and they are still deciding whether to restart this vs xarelo .  He looks great and ok to start mobilizing as you plan  Discussed in detail all the  indications, usual  risks and alternatives  relative to the benefits with patient/fma who agree  to proceed with conservative f/u as outlined above and f/u in pulmonary clinic prn     Christinia Gully, MD Pulmonary and [place] [phone] After 5:30 PM or weekends, call [phone]

## 2015-05-20 NOTE — Discharge Instructions (Signed)
You were admitted because of blood clots in your leg which traveled to your lungs and caused shortness of breath. These blood clots will eventually go away if you take a blood thinner. You were given heparin in the hospital and will take xarelto for this when you return home. This is a very important medication to take to ensure that the clots don't get worse, which could be fatal.   - TAKE XARELTO '15mg'$  TWICE A DAY WITH MEALS for the next 21 days - After 21 days, you will switch to '20mg'$  once daily - Follow up with Dr. Erin Hearing at [facility] on Wednesday at 10:45am (you already had this appointment) - You can call our clinic at [phone] if you have any questions or concerns - Return to the hospital if your trouble breathing returns or you have chest pain    Pulmonary Embolism A pulmonary embolism (PE) is a sudden blockage or decrease of blood flow in one lung or both lungs. Most blockages come from a blood clot that travels from the legs or the pelvis to the lungs. PE is a dangerous and potentially life-threatening condition if it is not treated right away. CAUSES A pulmonary embolism occurs most commonly when a blood clot travels from one of your veins to your lungs. Rarely, PE is caused by air, fat, amniotic fluid, or part of a tumor traveling through your veins to your lungs. RISK FACTORS A PE is more likely to develop in:  People who smoke.  People who areolder, especially over 33 years of age.  People who are overweight (obese).  People who sit or lie still for a long time, such as during long-distance travel (over 4 hours), bed rest, hospitalization, or during recovery from certain medical conditions like a stroke.  People who do not engage in much physical activity (sedentary lifestyle).  People who have chronic breathing disorders.  People whohave a personal or family history of blood clots or blood clotting disease.  People whohave peripheral vascular  disease (PVD), diabetes, or some types of cancer.  People who haveheart disease, especially if the person had a recent heart attack or has congestive heart failure.  People who have neurological diseases that affect the legs (leg paresis).  People who have had a traumatic injury, such as breaking a hip or leg.  People whohave recently had major or lengthy surgery, especially on the hip, knee, or abdomen.  People who have hada central line placed inside a large vein.  People who takemedicines that contain the hormone estrogen. These include birth control pills and hormone replacement therapy.  Pregnancy or during childbirth or the postpartum period. SIGNS AND SYMPTOMS  The symptoms of a PE usually start suddenly and include:  Shortness of breath while active or at rest.  Coughing or coughing up blood or blood-tinged mucus.  Chest pain that is often worse with deep breaths.  Rapid or irregular heartbeat.  Feeling light-headed or dizzy.  Fainting.  Feelinganxious.  Sweating. There may also be pain and swelling in a leg if that is where the blood clot started. These symptoms may represent a serious problem that is an emergency. Do not wait to see if the symptoms will go away. Get medical help right away. Call your local emergency services (911 in the [place] Do not drive yourself to the hospital. DIAGNOSIS Your health care provider will take a medical history and perform a physical exam. You may also have other tests, including:  Blood tests  to assess the clotting properties of your blood, assess oxygen levels in your blood, and find blood clots.  Imaging tests, such as CT, ultrasound, MRI, X-ray, and other tests to see if you have clots anywhere in your body.  An electrocardiogram (ECG) to look for heart strain from blood clots in the lungs. TREATMENT The main goals of PE treatment are:  To stop a blood clot from growing larger.  To stop new blood clots from  forming. The type of treatment that you receive depends on many factors, such as the cause of your PE, your risk for bleeding or developing more clots, and other medical conditions that you have. Sometimes, a combination of treatments is necessary. This condition may be treated with:  Medicines, including newer oral blood thinners (anticoagulants), warfarin, low molecular weight heparins, thrombolytics, or heparins.  Wearing compression stockings or using different types of devices.  Surgery (rare) to remove the blood clot or to place a filter in your abdomen to stop the blood clot from traveling to your lungs. Treatments for a PE are often divided into immediate treatment, long-term treatment (up to 3 months after PE), and extended treatment (more than 3 months after PE). Your treatment may continue for several months. This is called maintenance therapy, and it is used to prevent the forming of new blood clots. You can work with your health care provider to choose the treatment program that is best for you. What are anticoagulants? Anticoagulants are medicines that treat PEs. They can stop current blood clots from growing and stop new clots from forming. They cannot dissolve existing clots. Your body dissolves clots by itself over time. Anticoagulants are given by mouth, by injection, or through an IV tube. What are thrombolytics? Thrombolytics are clot-dissolving medicines that are used to dissolve a PE. They carry a high risk of bleeding, so they tend to be used only in severe cases or if you have very low blood pressure. HOME CARE INSTRUCTIONS If you are taking a newer oral anticoagulant:  Take the medicine every single day at the same time each day.  Understand what foods and drugs interact with this medicine.  Understand that there are no regular blood tests required when using this medicine.  Understandthe side effects of this medicine, including excessive bruising or bleeding. Ask  your health care provider or pharmacist about other possible side effects. If you are taking warfarin:  Understand how to take warfarin and know which foods can affect how warfarin works in Veterinary surgeon.  Understand that it is dangerous to taketoo much or too little warfarin. Too much warfarin increases the risk of bleeding. Too little warfarin continues to allow the risk for blood clots.  Follow your PT and INR blood testing schedule. The PT and INR results allow your health care provider to adjust your dose of warfarin. It is very important that you have your PT and INR tested as often as told by your health care provider.  Avoid major changes in your diet, or tell your health care provider before you change your diet. Arrange a visit with a registered dietitian to answer your questions. Many foods, especially foods that are high in vitamin K, can interfere with warfarin and affect the PT and INR results. Eat a consistent amount of foods that are high in vitamin K, such as:  Spinach, kale, broccoli, cabbage, collard greens, turnip greens, Brussels sprouts, peas, cauliflower, seaweed, and parsley.  Beef liver and pork liver.  Green tea.  Soybean  oil.  Tell your health care provider about any and all medicines, vitamins, and supplements that you take, including aspirin and other over-the-counter anti-inflammatory medicines. Be especially cautious with aspirin and anti-inflammatory medicines. Do not take those before you ask your health care provider if it is safe to do so. This is important because many medicines can interfere with warfarin and affect the PT and INR results.  Do not start or stop taking any over-the-counter or prescription medicine unless your health care provider or pharmacist tells you to do so. If you take warfarin, you will also need to do these things:  Hold pressure over cuts for longer than usual.  Tell your dentist and other health care providers that you are taking  warfarin before you have any procedures in which bleeding may occur.  Avoid alcohol or drink very small amounts. Tell your health care provider if you change your alcohol intake.  Do not use tobacco products, including cigarettes, chewing tobacco, and e-cigarettes. If you need help quitting, ask your health care provider.  Avoid contact sports. General Instructions  Take over-the-counter and prescription medicines only as told by your health care provider. Anticoagulant medicines can have side effects, including easy bruising and difficulty stopping bleeding. If you are prescribed an anticoagulant, you will also need to do these things:  Hold pressure over cuts for longer than usual.  Tell your dentist and other health care providers that you are taking anticoagulants before you have any procedures in which bleeding may occur.  Avoid contact sports.  Wear a medical alert bracelet or carry a medical alert card that says you have had a PE.  Ask your health care provider how soon you can go back to your normal activities. Stay active to prevent new blood clots from forming.  Make sure to exercise while traveling or when you have been sitting or standing for a long period of time. It is very important to exercise. Exercise your legs by walking or by tightening and relaxing your leg muscles often. Take frequent walks.  Wear compression stockings as told by your health care provider to help prevent more blood clots from forming.  Do not use tobacco products, including cigarettes, chewing tobacco, and e-cigarettes. If you need help quitting, ask your health care provider.  Keep all follow-up appointments with your health care provider. This is important. PREVENTION Take these actions to decrease your risk of developing another PE:  Exercise regularly. For at least 30 minutes every day, engage in:  Activity that involves moving your arms and legs.  Activity that encourages good blood flow  through your body by increasing your heart rate.  Exercise your arms and legs every hour during long-distance travel (over 4 hours). Drink plenty of water and avoid drinking alcohol while traveling.  Avoid sitting or lying in bed for long periods of time without moving your legs.  Maintain a weight that is appropriate for your height. Ask your health care provider what weight is healthy for you.  If you are a woman who is over 69 years of age, avoid unnecessary use of medicines that contain estrogen. These include birth control pills.  Do not smoke, especially if you take estrogen medicines. If you need help quitting, ask your health care provider.  If you are at very high risk for PE, wear compression stockings.  If you recently had a PE, have regularly scheduled ultrasound testing on your legs to check for new blood clots. If you are hospitalized, prevention  measures may include:  Early walking after surgery, as soon as your health care provider says that it is safe.  Receiving anticoagulants to prevent blood clots. If you cannot take anticoagulants, other options may be available, such as wearing compression stockings or using different types of devices. SEEK IMMEDIATE MEDICAL CARE IF:  You have new or increased pain, swelling, or redness in an arm or leg.  You have numbness or tingling in an arm or leg.  You have shortness of breath while active or at rest.  You have chest pain.  You have a rapid or irregular heartbeat.  You feel light-headed or dizzy.  You cough up blood.  You notice blood in your vomit, bowel movement, or urine.  You have a fever. These symptoms may represent a serious problem that is an emergency. Do not wait to see if the symptoms will go away. Get medical help right away. Call your local emergency services (911 in the [place] Do not drive yourself to the hospital.   Heart-Healthy Eating Plan Many factors influence your heart health, including  eating and exercise habits. Heart (coronary) risk increases with abnormal blood fat (lipid) levels. Heart-healthy meal planning includes limiting unhealthy fats, increasing healthy fats, and making other small dietary changes. This includes maintaining a healthy body weight to help keep lipid levels within a normal range. WHAT IS MY PLAN?  Your health care provider recommends that you:  Get no more than _________% of the total calories in your daily diet from fat.  Limit your intake of saturated fat to less than _________% of your total calories each day.  Limit the amount of cholesterol in your diet to less than _________ mg per day. WHAT TYPES OF FAT SHOULD I CHOOSE?  Choose healthy fats more often. Choose monounsaturated and polyunsaturated fats, such as olive oil and canola oil, flaxseeds, walnuts, almonds, and seeds.  Eat more omega-3 fats. Good choices include salmon, mackerel, sardines, tuna, flaxseed oil, and ground flaxseeds. Aim to eat fish at least two times each week.  Limit saturated fats. Saturated fats are primarily found in animal products, such as meats, butter, and cream. Plant sources of saturated fats include palm oil, palm kernel oil, and coconut oil.  Avoid foods with partially hydrogenated oils in them. These contain trans fats. Examples of foods that contain trans fats are stick margarine, some tub margarines, cookies, crackers, and other baked goods. WHAT GENERAL GUIDELINES DO I NEED TO FOLLOW?  Check food labels carefully to identify foods with trans fats or high amounts of saturated fat.  Fill one half of your plate with vegetables and green salads. Eat 4-5 servings of vegetables per day. A serving of vegetables equals 1 cup of raw leafy vegetables,  cup of raw or cooked cut-up vegetables, or  cup of vegetable juice.  Fill one fourth of your plate with whole grains. Look for the word "whole" as the first word in the ingredient list.  Fill one fourth of your  plate with lean protein foods.  Eat 4-5 servings of fruit per day. A serving of fruit equals one medium whole fruit,  cup of dried fruit,  cup of fresh, frozen, or canned fruit, or  cup of 100% fruit juice.  Eat more foods that contain soluble fiber. Examples of foods that contain this type of fiber are apples, broccoli, carrots, beans, peas, and barley. Aim to get 20-30 g of fiber per day.  Eat more home-cooked food and less restaurant, buffet, and fast  food.  Limit or avoid alcohol.  Limit foods that are high in starch and sugar.  Avoid fried foods.  Cook foods by using methods other than frying. Baking, boiling, grilling, and broiling are all great options. Other fat-reducing suggestions include:  Removing the skin from poultry.  Removing all visible fats from meats.  Skimming the fat off of stews, soups, and gravies before serving them.  Steaming vegetables in water or broth.  Lose weight if you are overweight. Losing just 5-10% of your initial body weight can help your overall health and prevent diseases such as diabetes and heart disease.  Increase your consumption of nuts, legumes, and seeds to 4-5 servings per week. One serving of dried beans or legumes equals  cup after being cooked, one serving of nuts equals 1 ounces, and one serving of seeds equals  ounce or 1 tablespoon.  You may need to monitor your salt (sodium) intake, especially if you have high blood pressure. Talk with your health care provider or dietitian to get more information about reducing sodium. WHAT FOODS CAN I EAT? Grains Breads, including [place], white, pita, wheat, raisin, rye, oatmeal, and [place]. Tortillas that are neither fried nor made with lard or trans fat. Low-fat rolls, including hotdog and hamburger buns and English muffins. Biscuits. Muffins. Waffles. Pancakes. Light popcorn. Whole-grain cereals. Flatbread. Melba toast. Pretzels. Breadsticks. Rusks. Low-fat snacks and crackers, including  oyster, saltine, matzo, graham, animal, and rye. Rice and pasta, including brown rice and those that are made with whole wheat. Vegetables All vegetables. Fruits All fruits, but limit coconut. Meats and Other Protein Sources Lean, well-trimmed beef, veal, pork, and lamb. Chicken and [place] without skin. All fish and shellfish. Wild duck, rabbit, pheasant, and venison. Egg whites or low-cholesterol egg substitutes. Dried beans, peas, lentils, and tofu.Seeds and most nuts. Dairy Low-fat or nonfat cheeses, including ricotta, string, and mozzarella. Skim or 1% milk that is liquid, powdered, or evaporated. Buttermilk that is made with low-fat milk. Nonfat or low-fat yogurt. Beverages Mineral water. Diet carbonated beverages. Sweets and Desserts Sherbets and fruit ices. Honey, jam, marmalade, jelly, and syrups. Meringues and gelatins. Pure sugar candy, such as hard candy, jelly beans, gumdrops, mints, marshmallows, and small amounts of dark chocolate. [employer] Eat all sweets and desserts in moderation. Fats and Oils Nonhydrogenated (trans-free) margarines. Vegetable oils, including soybean, sesame, sunflower, olive, peanut, safflower, corn, canola, and cottonseed. Salad dressings or mayonnaise that are made with a vegetable oil. Limit added fats and oils that you use for cooking, baking, salads, and as spreads. Other Cocoa powder. Coffee and tea. All seasonings and condiments. The items listed above may not be a complete list of recommended foods or beverages. Contact your dietitian for more options. WHAT FOODS ARE NOT RECOMMENDED? Grains Breads that are made with saturated or trans fats, oils, or whole milk. Croissants. Butter rolls. Cheese breads. Sweet rolls. Donuts. Buttered popcorn. Chow mein noodles. High-fat crackers, such as cheese or butter crackers. Meats and Other Protein Sources Fatty meats, such as hotdogs, short ribs, sausage, spareribs, bacon, ribeye roast or steak, and  mutton. High-fat deli meats, such as salami and bologna. Caviar. Domestic duck and goose. Organ meats, such as kidney, liver, sweetbreads, brains, gizzard, chitterlings, and heart. Dairy Cream, sour cream, cream cheese, and creamed cottage cheese. Whole milk cheeses, including blue (bleu), Monterey Jack, [place], [place], American, [place], Swiss, [place], [place], and [place]. Whole or 2% milk that is liquid, evaporated, or condensed. Whole buttermilk. Cream sauce or high-fat cheese  sauce. Yogurt that is made from whole milk. Beverages Regular sodas and drinks with added sugar. Sweets and Desserts Frosting. Pudding. Cookies. Cakes other than angel food cake. Candy that has milk chocolate or white chocolate, hydrogenated fat, butter, coconut, or unknown ingredients. Buttered syrups. Full-fat ice cream or ice cream drinks. Fats and Oils Gravy that has suet, meat fat, or shortening. Cocoa butter, hydrogenated oils, palm oil, coconut oil, palm kernel oil. These can often be found in baked products, candy, fried foods, nondairy creamers, and whipped toppings. Solid fats and shortenings, including bacon fat, salt pork, lard, and butter. Nondairy cream substitutes, such as coffee creamers and sour cream substitutes. Salad dressings that are made of unknown oils, cheese, or sour cream. The items listed above may not be a complete list of foods and beverages to avoid. Contact your dietitian for more information.   This information is not intended to replace advice given to you by your health care provider. Make sure you discuss any questions you have with your health care provider.   Document Released: 10/24/2007 Document Revised: 02/04/2014 Document Reviewed: 07/08/2013 [employer]vier Interactive Patient Education [employer].

## 2015-05-21 DIAGNOSIS — N179 Acute kidney failure, unspecified: Secondary | ICD-10-CM | POA: Diagnosis not present

## 2015-05-21 DIAGNOSIS — R0609 Other forms of dyspnea: Secondary | ICD-10-CM | POA: Diagnosis not present

## 2015-05-21 DIAGNOSIS — I824Y1 Acute embolism and thrombosis of unspecified deep veins of right proximal lower extremity: Secondary | ICD-10-CM | POA: Diagnosis not present

## 2015-05-21 DIAGNOSIS — I2699 Other pulmonary embolism without acute cor pulmonale: Secondary | ICD-10-CM | POA: Diagnosis not present

## 2015-05-21 LAB — GLUCOSE, CAPILLARY: GLUCOSE-CAPILLARY: 135 mg/dL — AB (ref 65–99)

## 2015-05-21 NOTE — Discharge Summary (Signed)
[facility] Discharge Summary  Patient name: Arthur [name] Medical record number: [id number] Date of birth: Oct 16, 1940 Age: 75 y.o. Gender: male Date of Admission: 05/18/2015  Date of Discharge: 05/21/2015 Admitting Physician: Lupita Dawn, MD  Primary Care Provider: Lind Covert, MD Consultants: Pulmonology, interventional radiology  Indication for Hospitalization: dyspnea on exertion found to have submassive PE with R heart strain  Discharge Diagnoses/Problem List:  Submassive PE with right heart strain (RV/LV 1.9) acute right posterior tibial/soleal vein DVT AKI T2DM, well-controlled HTN: well controlled on 4 drug regimen HLD on pravastatin  Disposition: home  Discharge Condition: stable  Discharge Exam:  Gen: Very well-appearing elderly gentleman sitting in bedside chair in NAD CV: Regular rate, no murmur; no LE edema Pulm: Non-labored breathing ambient air, CTAB Abd: Soft, non-tender, non-distended, +BS Neuro: A&Ox3, no focal deficits Psych: Appropriate mood and affect  Brief Hospital Course:  Arthur [name] is a 75 y.o. male presenting on 4/20 with dyspnea on exertion found to have pulmonary embolus on CTA chest.   Aside from 13-hour flight 3 months prior, there are no provoking factors or history of clot. He had evidence of acute RLE DVTs as well. Pulmonology was consulted due to radiologic evidence of right heart strain (RV/LV 1.9). They consulted IR for evaluation for EKOS (intra-arterial thrombolysis), though the patient's dramatic clinical improvement on heparin alone suggested limited benefit, and this was not performed. He continued to improve over the following days, ambulating regularly with only mild dyspnea on day of discharge. Heparin was transitioned to xarelto '20mg'$  BID prior to discharge. Pulmonology plans to follow up in the office following discharge. Patient was originally scheduled for discharged on 4/22, however  developed SOB and dizziness when walking. He stayed for one additional night, then was discharged the next day once symptoms improved.   Issues for Follow Up:  1. F/u xarelto compliance. Will switch to '20mg'$  daily dose on 5/13 (after 21 days of '15mg'$  BID).  2. Recommend follow up BMP to assess resolution of AKI. CrCl was estimated 47m/min in setting of AKI, so full dose xarelto was prescribed. The Beers Criteria recommends to reduce the dose in older adults ?65 years with a CrCl between 30 to 50 mL/minute (specific dosage adjustment not provided).  Significant Procedures: none  Significant Labs and Imaging:   Recent Labs Lab 05/18/15 1050 05/19/15 0407  WBC 10.4 9.9  HGB 11.2* 9.9*  HCT 36.0* 31.7*  PLT 362 347    Recent Labs Lab 05/18/15 1050 05/19/15 0407  NA 137 139  K 4.0 3.5  CL 100* 105  CO2 24 22  GLUCOSE 146* 128*  BUN 15 16  CREATININE 1.44* 1.38*  CALCIUM 9.2 8.3*  ALKPHOS 60  --   AST 18  --   ALT 10*  --   ALBUMIN 3.4*  --    CTA Chest 4/20: Acute pulmonary embolism with CT evidence of right heart strain (RV/LV Ratio = 1.9), at least submassive and intermediate risk PE.  Vascular U/S lower extremities 4/21:  - Findings consistent with acute deep vein thrombosis involving the  right posterial tibial vein and right soleal vein. - No evidence of deep vein thrombosis involving the left lower  extremity.  ECHO 4/20: - Left ventricle: The cavity size was normal. There was mild focal  basal hypertrophy of the septum. Systolic function was normal.  The estimated ejection fraction was in the range of 55% to 60%.  Wall motion was normal; there were  no regional wall motion  abnormalities. Doppler parameters are consistent with abnormal  left ventricular relaxation (grade 1 diastolic dysfunction). - Ventricular septum: The contour showed diastolic flattening and  systolic flattening. - Right ventricle: The cavity size was mildly dilated. Systolic   function was severely reduced. - Right atrium: The atrium was mildly dilated. - Pulmonary arteries: Systolic pressure was moderately to severely  increased. PA peak pressure: 54 mm Hg (S).  Results/Tests Pending at Time of Discharge: None  Discharge Medications:    Medication List    TAKE these medications        amLODipine 10 MG tablet  Commonly known as:  NORVASC  Take 1 tablet (10 mg total) by mouth at bedtime.  Notes to Patient:  TAKE TONIGHT     atenolol 50 MG tablet  Commonly known as:  TENORMIN  Take 1 tablet (50 mg total) by mouth 2 (two) times daily.  Notes to Patient:  TAKE ONE DOSE TONIGHT     benzonatate 100 MG capsule  Commonly known as:  TESSALON  Take 1 capsule (100 mg total) by mouth 2 (two) times daily as needed for cough.     hydrochlorothiazide 25 MG tablet  Commonly known as:  HYDRODIURIL  Take 1 tablet (25 mg total) by mouth daily.     losartan 100 MG tablet  Commonly known as:  COZAAR  Take 1 tablet (100 mg total) by mouth daily.     metFORMIN 1000 MG tablet  Commonly known as:  GLUCOPHAGE  Take 1 tablet by mouth twice a day with a meal     omeprazole 20 MG capsule  Commonly known as:  PRILOSEC  Take 1 capsule (20 mg total) by mouth daily as needed.     pravastatin 40 MG tablet  Commonly known as:  PRAVACHOL  Take 1 tablet (40 mg total) by mouth daily.  Notes to Patient:  TAKE TONIGHT     Rivaroxaban 15 MG Tabs tablet  Commonly known as:  XARELTO  Take 1 tablet (15 mg total) by mouth 2 (two) times daily before a meal.        Discharge Instructions: Please refer to Patient Instructions section of EMR for full details.  Patient was counseled important signs and symptoms that should prompt return to medical care, changes in medications, dietary instructions, activity restrictions, and follow up appointments.   Follow-Up Appointments:     Follow-up Information    Follow up with Lind Covert, MD On 05/24/2015.   Specialty:  Family  Medicine   Why:  10:45am   Contact information:   [place] [address] [phone]       Verner Mould, MD 05/21/2015, 10:40 AM PGY-1, [place]

## 2015-05-24 ENCOUNTER — Ambulatory Visit (INDEPENDENT_AMBULATORY_CARE_PROVIDER_SITE_OTHER): Payer: PPO | Admitting: Family Medicine

## 2015-05-24 ENCOUNTER — Encounter: Payer: Self-pay | Admitting: Family Medicine

## 2015-05-24 ENCOUNTER — Ambulatory Visit: Payer: PPO | Admitting: Family Medicine

## 2015-05-24 VITALS — BP 127/66 | HR 73 | Temp 98.7°F | Ht 71.0 in | Wt 181.4 lb

## 2015-05-24 DIAGNOSIS — I10 Essential (primary) hypertension: Secondary | ICD-10-CM

## 2015-05-24 DIAGNOSIS — I2699 Other pulmonary embolism without acute cor pulmonale: Secondary | ICD-10-CM | POA: Diagnosis not present

## 2015-05-24 MED ORDER — RIVAROXABAN 20 MG PO TABS
20.0000 mg | ORAL_TABLET | Freq: Every day | ORAL | Status: DC
Start: 2015-05-24 — End: 2016-01-31

## 2015-05-24 NOTE — Assessment & Plan Note (Signed)
BP Readings from Last 3 Encounters:  05/24/15 127/66  05/21/15 122/69  05/18/15 139/70   Well controlled by readings and asymptomatic so continue current medications

## 2015-05-24 NOTE — Patient Instructions (Addendum)
Good to see you today!  Thanks for coming in.  After a total of 21 days of xarelto 15 mg twice a day change to 20 mg once every morning.  If you are having troulbe with insurance cost then let me know  Come back in one month sooner if any problems

## 2015-05-24 NOTE — Assessment & Plan Note (Signed)
Stable.  Continue treatment dose of xrleto then change to daily.  No signs of cardiopulmonary compromise.  Will need follow up echo in several months and screening colonoscopy once out of acute phase of PE

## 2015-05-24 NOTE — Progress Notes (Signed)
   Subjective:    Patient ID: Arthur [name], male    DOB: 1940/08/12, 75 y.o.   MRN: [id number]  HPI  Follow up Pulmonary Emboli Recently hospitalize for submassive pe of unknown cause.   He is taking medications without problem.  No bleeding or edema.  Does not have shortness of breath or fatigue with usual activities.  No leg swelling or pain.  HYPERTENSION Disease Monitoring Home BP Monitoring (Severity) not checking Symptoms - Chest pain- no    Dyspnea- no Medications(Modifying factors) Compliance-  Taking all usual meds. Lightheadedness-  no  Edema- no Timing - continuous  Duration - years ROS - See HPI  PMH Lab Review   POTASSIUM  Date Value Ref Range Status  05/19/2015 3.5 3.5 - 5.1 mmol/L Final   SODIUM  Date Value Ref Range Status  05/19/2015 139 135 - 145 mmol/L Final   CREAT  Date Value Ref Range Status  03/20/2015 1.23* 0.70 - 1.18 mg/dL Final   CREATININE, SER  Date Value Ref Range Status  05/19/2015 1.38* 0.61 - 1.24 mg/dL Final       Chief Complaint noted Review of Symptoms - see HPI PMH - Smoking status noted.   Vital Signs reviewed    Review of Systems     Objective:   Physical Exam  Alert nad Lungs:  Normal respiratory effort, chest expands symmetrically. Lungs are clear to auscultation, no crackles or wheezes. Heart - Regular rate and rhythm.  No murmurs, gallops or rubs.    Extremities:  No cyanosis, edema, or deformity noted with good range of motion of all major joints.         Assessment & Plan:

## 2015-05-30 ENCOUNTER — Telehealth: Payer: Self-pay | Admitting: Family Medicine

## 2015-05-30 NOTE — Telephone Encounter (Signed)
Will forward to MD to see if patient can get this letter to cover his time out of work since he was in the hospital. Patient stated that he has already spoken with his pcp regarding this letter. Janee Ureste,CMA

## 2015-05-30 NOTE — Telephone Encounter (Signed)
Printed Letter and given to blue team

## 2015-05-30 NOTE — Telephone Encounter (Signed)
Pt called and needs a letter stating that he can be out of work for 2 weeks. Please call when ready to pick up. jw

## 2015-05-30 NOTE — Telephone Encounter (Signed)
Contacted pt and he would like for me to mail his letter. I will mail this afternoon.

## 2015-05-31 ENCOUNTER — Other Ambulatory Visit: Payer: Self-pay | Admitting: Family Medicine

## 2015-06-07 ENCOUNTER — Ambulatory Visit (INDEPENDENT_AMBULATORY_CARE_PROVIDER_SITE_OTHER): Payer: PPO | Admitting: Family Medicine

## 2015-06-07 ENCOUNTER — Encounter: Payer: Self-pay | Admitting: Family Medicine

## 2015-06-07 VITALS — BP 140/80 | HR 76 | Temp 98.3°F | Ht 71.0 in | Wt 182.0 lb

## 2015-06-07 DIAGNOSIS — I2699 Other pulmonary embolism without acute cor pulmonale: Secondary | ICD-10-CM

## 2015-06-07 DIAGNOSIS — R05 Cough: Secondary | ICD-10-CM

## 2015-06-07 DIAGNOSIS — R059 Cough, unspecified: Secondary | ICD-10-CM

## 2015-06-07 MED ORDER — LORATADINE 10 MG PO TABS: 10.0000 mg | ORAL_TABLET | Freq: Every day | ORAL | Status: DC

## 2015-06-07 NOTE — Patient Instructions (Addendum)
Good to see you today!  Thanks for coming in.  Take Robitussin Dexamethoraphan (DM) take every 8 hours as needed for cough  Loratadine 10 mg take one a day  The cough should be better in 1 week if not then call me and we will get a chest xray  Come back next week or in one month

## 2015-06-08 DIAGNOSIS — R058 Other specified cough: Secondary | ICD-10-CM | POA: Insufficient documentation

## 2015-06-08 DIAGNOSIS — R05 Cough: Secondary | ICD-10-CM | POA: Insufficient documentation

## 2015-06-08 NOTE — Progress Notes (Signed)
05/31/2015  Subjective:    Patient ID: Arthur [name], male    DOB: March 03, 1940, 75 y.o.   MRN: [id number]  HPI  COUGH  Has been coughing for 3-4 days. Cough is: dry hacky Sputum production: no Medications tried: tessalon did not help Taking blood pressure medications: yes  Symptoms Runny nose: no Mucous in back of throat: no Throat burning or reflux: no Wheezing or asthma: no Fever: no Chest Pain: no Shortness of breath: no - no symptoms like he had with recent pe Leg swelling: no Hemoptysis: no Weight loss: no   Pulmonary embolism Taking medications as prescribed (brought them) Knows date to switch to 20 mg.  No shortness of breath or hemoptysis or leg swelling. He does not feel quite ready to go back to work.  No bleeding in bowel movement or gums or nose  Chief Complaint noted Review of Symptoms - see HPI PMH - Smoking status noted.   Vital Signs reviewed       Review of Systems     Objective:   Physical Exam Alert nad Heart - Regular rate and rhythm.  No murmurs, gallops or rubs.    Lungs - scant crackles at deep bases with deep inspiration.  No coughing during vist.  No wheeze Extremities:  No cyanosis, edema, or deformity noted with good range of motion of all major joints.          Assessment & Plan:

## 2015-06-08 NOTE — Assessment & Plan Note (Signed)
Stable.  Continue long term anicoagulation Needs colonoscopy

## 2015-06-08 NOTE — Assessment & Plan Note (Signed)
New episode but this is recurrent.  Will treat with supresant and antihistamine.   If persists may need further work up -  Had recent April 17 chest ct for pe diagnosis.  Would start with Cxr and perhaps PFTs

## 2015-06-14 ENCOUNTER — Encounter: Payer: Self-pay | Admitting: Family Medicine

## 2015-06-14 ENCOUNTER — Ambulatory Visit: Payer: PPO | Admitting: Family Medicine

## 2015-06-14 ENCOUNTER — Ambulatory Visit (INDEPENDENT_AMBULATORY_CARE_PROVIDER_SITE_OTHER): Payer: PPO | Admitting: Family Medicine

## 2015-06-14 VITALS — BP 147/94 | HR 74 | Temp 97.7°F | Wt 180.0 lb

## 2015-06-14 DIAGNOSIS — R05 Cough: Secondary | ICD-10-CM

## 2015-06-14 DIAGNOSIS — I2699 Other pulmonary embolism without acute cor pulmonale: Secondary | ICD-10-CM

## 2015-06-14 DIAGNOSIS — R059 Cough, unspecified: Secondary | ICD-10-CM

## 2015-06-14 MED ORDER — FLUTICASONE PROPIONATE 50 MCG/ACT NA SUSP: 2.0000 | Freq: Every day | NASAL | Status: DC

## 2015-06-14 MED ORDER — GUAIFENESIN 100 MG/5ML PO LIQD: 200.0000 mg | Freq: Three times a day (TID) | ORAL | Status: DC | PRN

## 2015-06-14 NOTE — Progress Notes (Signed)
   Subjective:    Patient ID: Arthur [name], male    DOB: November 23, 1940, 75 y.o.   MRN: [id number]  HPI  Cough Continues without improvement except when he went to [place] and did not cough for 3 days.  No fever or  sputum or shortness of breath or chest pain or leg swelling or pain.  Has been using prior prescriptions  PE Taking xarelto once per day now.  No bleeding.  No shortness of breath or chest pain.  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   Vital Signs reviewed   Review of Systems     Objective:   Physical Exam  Alert nad Lungs - few scattered rales at bases no wheeze Heart - Regular rate and rhythm.  No murmurs, gallops or rubs.    Extremities:  No cyanosis, edema, or deformity noted with good range of motion of all major joints.   Nose - no significant edema or redness      Assessment & Plan:

## 2015-06-14 NOTE — Assessment & Plan Note (Addendum)
Stable continue Xarelto for at least 6 months likely longer since did not have an identified cause.  Will continue to suggest colonoscopy

## 2015-06-14 NOTE — Assessment & Plan Note (Signed)
No improvement.  Given was better when he was away this suggests allergies.  Will add flonase and follow.  If persists may need imaging. Xarelto seems to have a low incidence of cough

## 2015-06-14 NOTE — Patient Instructions (Signed)
Good to see you today!  Thanks for coming in.  Come back in one month We will check your lungs and diabetes  Call for an appointment tomorrow

## 2015-06-27 ENCOUNTER — Telehealth: Payer: Self-pay | Admitting: *Deleted

## 2015-06-27 NOTE — Telephone Encounter (Signed)
Will forward to MD.  

## 2015-06-27 NOTE — Telephone Encounter (Signed)
Patient states he needs a letter stating he can resume work on 6/10 on light duty. Please call with any questions.

## 2015-06-28 ENCOUNTER — Other Ambulatory Visit: Payer: Self-pay | Admitting: Family Medicine

## 2015-06-28 NOTE — Telephone Encounter (Signed)
Will forward to MD to give ok for returning to work. Khing Belcher,CMA

## 2015-06-28 NOTE — Telephone Encounter (Signed)
Needs return to work letter

## 2015-06-28 NOTE — Telephone Encounter (Signed)
Pt is calling and really needs this letter today to take to his work. Please leave up front and let him know so he can pick this up. jw

## 2015-07-12 ENCOUNTER — Ambulatory Visit (INDEPENDENT_AMBULATORY_CARE_PROVIDER_SITE_OTHER): Payer: PPO | Admitting: Family Medicine

## 2015-07-12 ENCOUNTER — Encounter: Payer: Self-pay | Admitting: Family Medicine

## 2015-07-12 VITALS — BP 141/63 | HR 78 | Temp 98.2°F | Ht 71.0 in | Wt 184.0 lb

## 2015-07-12 DIAGNOSIS — E1169 Type 2 diabetes mellitus with other specified complication: Secondary | ICD-10-CM

## 2015-07-12 DIAGNOSIS — E785 Hyperlipidemia, unspecified: Secondary | ICD-10-CM | POA: Diagnosis not present

## 2015-07-12 DIAGNOSIS — R059 Cough, unspecified: Secondary | ICD-10-CM

## 2015-07-12 DIAGNOSIS — Z1211 Encounter for screening for malignant neoplasm of colon: Secondary | ICD-10-CM

## 2015-07-12 DIAGNOSIS — I10 Essential (primary) hypertension: Secondary | ICD-10-CM

## 2015-07-12 DIAGNOSIS — R05 Cough: Secondary | ICD-10-CM

## 2015-07-12 DIAGNOSIS — I2699 Other pulmonary embolism without acute cor pulmonale: Secondary | ICD-10-CM

## 2015-07-12 LAB — POCT GLYCOSYLATED HEMOGLOBIN (HGB A1C): HEMOGLOBIN A1C: 6.7

## 2015-07-12 MED ORDER — ZOSTER VACCINE LIVE 19400 UNT/0.65ML ~~LOC~~ SUSR: 0.6500 mL | Freq: Once | SUBCUTANEOUS | Status: DC

## 2015-07-12 NOTE — Assessment & Plan Note (Signed)
Lab Results  Component Value Date   [place] 90 03/20/2015    At goal

## 2015-07-12 NOTE — Progress Notes (Signed)
S  Cough - totally gone.  No shortness of breath or edema.  Stopped all the allergy medications  HYPERTENSION Disease Monitoring: Blood pressure range-not checking Chest pain, palpitations- no      Dyspnea- no Medications: Compliance- knows all meds Lightheadedness,Syncope- no   Edema- no  DIABETES Disease Monitoring: Blood Sugar ranges-not checking Polyuria/phagia/dipsia- no      Visual problems- no Medications: Compliance- metformin Hypoglycemic symptoms- no  HYPERLIPIDEMIA Disease Monitoring: See symptoms for Hypertension Medications: Compliance- pravachol Right upper quadrant pain- no  Muscle aches- no  Monitoring Labs and Parameters Last A1C:  Lab Results  Component Value Date   HGBA1C 6.7 07/12/2015    Last Lipid:     Component Value Date/Time   CHOL 163 03/20/2015 1033   HDL 43 03/20/2015 1033    Last Bmet  POTASSIUM  Date Value Ref Range Status  05/19/2015 3.5 3.5 - 5.1 mmol/L Final   SODIUM  Date Value Ref Range Status  05/19/2015 139 135 - 145 mmol/L Final   CREAT  Date Value Ref Range Status  03/20/2015 1.23* 0.70 - 1.18 mg/dL Final   CREATININE, SER  Date Value Ref Range Status  05/19/2015 1.38* 0.61 - 1.24 mg/dL Final      Last BPs:  BP Readings from Last 3 Encounters:  07/12/15 141/63  06/14/15 147/94  06/07/15 140/80    Chief Complaint noted Review of Symptoms - see HPI PMH - Smoking status noted.   Vital Signs reviewed   O  Alert nad Heart - Regular rate and rhythm.  No murmurs, gallops or rubs.    Lungs:  Normal respiratory effort, chest expands symmetrically. Lungs are clear to auscultation, no crackles or wheezes. Extremities:  No cyanosis, edema, or deformity noted with good range of motion of all major joints.

## 2015-07-12 NOTE — Assessment & Plan Note (Signed)
Resolved

## 2015-07-12 NOTE — Assessment & Plan Note (Signed)
Stable on anticoagulation. Recommend colonoscopy -

## 2015-07-12 NOTE — Assessment & Plan Note (Signed)
Essentially at goal on 3 medications will observe

## 2015-07-12 NOTE — Assessment & Plan Note (Signed)
At goal. Saw eye doctor in [place]

## 2015-07-12 NOTE — Patient Instructions (Addendum)
Nice to see you today.  Glad you are feeling very well  You need a colonoscopy to make sure you do not have colon cancer.  I will refer you and they should call you for an appointment  You should see your eye doctor in the fall for a vision check   You should get a shingles shot from your pharmacy.  Take the prescription to them   Come back in 3 months  I will call you with your diabetes test result

## 2015-07-21 NOTE — Progress Notes (Signed)
Subjective  Patient is presenting with the following illnesses     Chief Complaint noted Review of Symptoms - see HPI PMH - Smoking status noted.     Objective Vital Signs reviewed     Assessments/Plans  See Encounter view if individual problem A/Ps not visible See after visit summary for details of patient instuctions

## 2015-08-11 ENCOUNTER — Encounter: Payer: Self-pay | Admitting: Gastroenterology

## 2015-09-06 ENCOUNTER — Ambulatory Visit (INDEPENDENT_AMBULATORY_CARE_PROVIDER_SITE_OTHER): Payer: PPO | Admitting: Family Medicine

## 2015-09-06 ENCOUNTER — Encounter: Payer: Self-pay | Admitting: Family Medicine

## 2015-09-06 DIAGNOSIS — I10 Essential (primary) hypertension: Secondary | ICD-10-CM

## 2015-09-06 DIAGNOSIS — D6489 Other specified anemias: Secondary | ICD-10-CM | POA: Diagnosis not present

## 2015-09-06 DIAGNOSIS — I2699 Other pulmonary embolism without acute cor pulmonale: Secondary | ICD-10-CM | POA: Diagnosis not present

## 2015-09-06 DIAGNOSIS — D649 Anemia, unspecified: Secondary | ICD-10-CM | POA: Insufficient documentation

## 2015-09-06 DIAGNOSIS — D539 Nutritional anemia, unspecified: Secondary | ICD-10-CM | POA: Diagnosis not present

## 2015-09-06 LAB — CBC
HEMATOCRIT: 33.7 % — AB (ref 38.5–50.0)
HEMOGLOBIN: 10 g/dL — AB (ref 13.2–17.1)
MCH: 22.8 pg — ABNORMAL LOW (ref 27.0–33.0)
MCHC: 29.7 g/dL — ABNORMAL LOW (ref 32.0–36.0)
MCV: 76.9 fL — AB (ref 80.0–100.0)
MPV: 8.5 fL (ref 7.5–12.5)
Platelets: 409 10*3/uL — ABNORMAL HIGH (ref 140–400)
RBC: 4.38 MIL/uL (ref 4.20–5.80)
RDW: 18.1 % — AB (ref 11.0–15.0)
WBC: 7.6 10*3/uL (ref 3.8–10.8)

## 2015-09-06 LAB — FERRITIN: Ferritin: 8 ng/mL — ABNORMAL LOW (ref 20–380)

## 2015-09-06 NOTE — Patient Instructions (Signed)
Good to see you today!  Thanks for coming in.  I will call you if your tests are not good.  Otherwise I will send you a letter.  If you do not hear from me with in 2 weeks please call our office.     Come back in 3 months for diabetes check  I would do some light weight lifting for your strength

## 2015-09-06 NOTE — Assessment & Plan Note (Signed)
BP Readings from Last 3 Encounters:  09/06/15 133/62  07/12/15 (!) 141/63  06/14/15 (!) 147/94   At goal

## 2015-09-06 NOTE — Progress Notes (Signed)
Subjective  Patient is presenting with the following illnesses  HYPERTENSION Disease Monitoring: Blood pressure range-not checking Chest pain, palpitations- no      Dyspnea- no Medications: Compliance- taking all his meds Lightheadedness,Syncope- no   Edema- no  DIABETES Disease Monitoring: Blood Sugar ranges-not checking Polyuria/phagia/dipsia- no      Visual problems- no Medications: Compliance- metformin Hypoglycemic symptoms- no  HYPERLIPIDEMIA Disease Monitoring: See symptoms for Hypertension Medications: Compliance- pravastatin Right upper quadrant pain- no  Muscle aches- no  Pulmonary Emboli Taking xarelto regularly.  No bleeding No shortness of breath or chest pain.    Anemia Last hgb 9.  Has colonoscopy appointment scheduled.  No bleeding or sigsn of anemia    Monitoring Labs and Parameters Last A1C:  Lab Results  Component Value Date   HGBA1C 6.7 07/12/2015    Last Lipid:     Component Value Date/Time   CHOL 163 03/20/2015 1033   HDL 43 03/20/2015 1033    Last Bmet  Potassium  Date Value Ref Range Status  05/19/2015 3.5 3.5 - 5.1 mmol/L Final   Sodium  Date Value Ref Range Status  05/19/2015 139 135 - 145 mmol/L Final   Creat  Date Value Ref Range Status  03/20/2015 1.23 (H) 0.70 - 1.18 mg/dL Final   Creatinine, Ser  Date Value Ref Range Status  05/19/2015 1.38 (H) 0.61 - 1.24 mg/dL Final      Last BPs:  BP Readings from Last 3 Encounters:  09/06/15 133/62  07/12/15 (!) 141/63  06/14/15 (!) 147/94    Chief Complaint noted Review of Symptoms - see HPI PMH - Smoking status noted.   Vital Signs reviewed   Objective Vital Signs reviewed nad able to get up and down from exam table without assistance Heart - Regular rate and rhythm.  No murmurs, gallops or rubs.    Lungs:  Normal respiratory effort, chest expands symmetrically. Lungs are clear to auscultation, no crackles or wheezes. No edema    Assessments/Plans  No  problem-specific Assessment & Plan notes found for this encounter.   See Encounter view if individual problem A/Ps not visible See after visit summary for details of patient instuctions

## 2015-09-06 NOTE — Assessment & Plan Note (Signed)
Asymptomatic.  Check labs. 

## 2015-09-06 NOTE — Assessment & Plan Note (Signed)
No signs of residual or of complications from treatment

## 2015-09-08 ENCOUNTER — Encounter: Payer: Self-pay | Admitting: Family Medicine

## 2015-09-08 NOTE — Progress Notes (Deleted)
Subjective  s

## 2015-10-09 ENCOUNTER — Encounter: Payer: Self-pay | Admitting: Family Medicine

## 2015-10-09 ENCOUNTER — Ambulatory Visit (INDEPENDENT_AMBULATORY_CARE_PROVIDER_SITE_OTHER): Payer: PPO | Admitting: Family Medicine

## 2015-10-09 VITALS — BP 152/67 | HR 73 | Temp 98.7°F | Wt 187.0 lb

## 2015-10-09 DIAGNOSIS — I10 Essential (primary) hypertension: Secondary | ICD-10-CM

## 2015-10-09 DIAGNOSIS — Z23 Encounter for immunization: Secondary | ICD-10-CM

## 2015-10-09 DIAGNOSIS — E1169 Type 2 diabetes mellitus with other specified complication: Secondary | ICD-10-CM

## 2015-10-09 DIAGNOSIS — I2699 Other pulmonary embolism without acute cor pulmonale: Secondary | ICD-10-CM

## 2015-10-09 DIAGNOSIS — D6489 Other specified anemias: Secondary | ICD-10-CM

## 2015-10-09 LAB — CBC
HEMATOCRIT: 37.7 % — AB (ref 38.5–50.0)
Hemoglobin: 11.4 g/dL — ABNORMAL LOW (ref 13.2–17.1)
MCH: 23.9 pg — ABNORMAL LOW (ref 27.0–33.0)
MCHC: 30.2 g/dL — AB (ref 32.0–36.0)
MCV: 79.2 fL — ABNORMAL LOW (ref 80.0–100.0)
MPV: 8.7 fL (ref 7.5–12.5)
PLATELETS: 385 10*3/uL (ref 140–400)
RBC: 4.76 MIL/uL (ref 4.20–5.80)
RDW: 19.6 % — AB (ref 11.0–15.0)
WBC: 9.2 10*3/uL (ref 3.8–10.8)

## 2015-10-09 LAB — POCT GLYCOSYLATED HEMOGLOBIN (HGB A1C): HEMOGLOBIN A1C: 6.3

## 2015-10-09 NOTE — Assessment & Plan Note (Signed)
No signs of recurrence.  Continue anticoagulation for at least a year given was unprovoked

## 2015-10-09 NOTE — Patient Instructions (Signed)
Good to see you today!  Thanks for coming in.  I will call you if your blood test is not good  Come back in 3 months for a diabetes test  If any shortness of breath or chest pain call me  Take your iron tablets three times a day

## 2015-10-09 NOTE — Assessment & Plan Note (Addendum)
Improved clinically. Check labs.  Iron three times a day.  Has appointment scheduled with GI for evaluation

## 2015-10-09 NOTE — Progress Notes (Signed)
Subjective  Patient is presenting with the following illnesses  Feels over all well  PULMONARY EMBOLI No shortness of breath or chest pain.  Taking DOAC regularly.  No bleeding  ANEMIA Taking iron once a day.  Has not noted any bleeding.  No lightheadness or edema or shortness of breath   HYPERTENSION Disease Monitoring: Blood pressure range-not checking Chest pain, palpitations- no      Dyspnea- no Medications: Compliance- every day Lightheadedness,Syncope- no   Edema- no  DIABETES Disease Monitoring: Blood Sugar ranges-not checking Polyuria/phagia/dipsia- no      Visual problems- no Medications: Compliance- daily Hypoglycemic symptoms- no   Monitoring Labs and Parameters Last A1C:  Lab Results  Component Value Date   HGBA1C 6.3 10/09/2015    Last Lipid:     Component Value Date/Time   CHOL 163 03/20/2015 1033   HDL 43 03/20/2015 1033    Last Bmet  Potassium  Date Value Ref Range Status  05/19/2015 3.5 3.5 - 5.1 mmol/L Final   Sodium  Date Value Ref Range Status  05/19/2015 139 135 - 145 mmol/L Final   Creat  Date Value Ref Range Status  03/20/2015 1.23 (H) 0.70 - 1.18 mg/dL Final   Creatinine, Ser  Date Value Ref Range Status  05/19/2015 1.38 (H) 0.61 - 1.24 mg/dL Final      Last BPs:  BP Readings from Last 3 Encounters:  10/09/15 (!) 152/67  09/06/15 133/62  07/12/15 (!) 141/63     Chief Complaint noted Review of Symptoms - see HPI PMH - Smoking status noted.     Objective Vital Signs reviewed Heart - Regular rate and rhythm.  No murmurs, gallops or rubs.    Lungs:  Normal respiratory effort, chest expands symmetrically. Lungs are clear to auscultation, no crackles or wheezes. Extremities:  No cyanosis, edema, or deformity noted with good range of motion of all major joints.       Assessments/Plans  No problem-specific Assessment & Plan notes found for this encounter.   See Encounter view if individual problem A/Ps not  visible See after visit summary for details of patient instuctions

## 2015-10-09 NOTE — Assessment & Plan Note (Signed)
At Goal.  Continue medications

## 2015-10-09 NOTE — Assessment & Plan Note (Signed)
BP Readings from Last 3 Encounters:  10/09/15 (!) 152/67  09/06/15 133/62  07/12/15 (!) 141/63   Not at goal today but on 3 medications and seems to be taking regularly .  Will follow

## 2015-10-15 ENCOUNTER — Encounter: Payer: Self-pay | Admitting: Family Medicine

## 2015-10-15 NOTE — Progress Notes (Signed)
Subjective  Patient is presenting with the following illnesses     Chief Complaint noted Review of Symptoms - see HPI PMH - Smoking status noted.     Objective Vital Signs reviewed     Assessments/Plans  No problem-specific Assessment & Plan notes found for this encounter.   See Encounter view if individual problem A/Ps not visible See after visit summary for details of patient instuctions 

## 2015-10-18 ENCOUNTER — Encounter: Payer: Self-pay | Admitting: Gastroenterology

## 2015-10-18 ENCOUNTER — Telehealth: Payer: Self-pay

## 2015-10-18 ENCOUNTER — Ambulatory Visit (INDEPENDENT_AMBULATORY_CARE_PROVIDER_SITE_OTHER): Payer: PPO | Admitting: Gastroenterology

## 2015-10-18 VITALS — BP 130/58 | HR 72 | Ht 71.0 in | Wt 183.0 lb

## 2015-10-18 DIAGNOSIS — I2699 Other pulmonary embolism without acute cor pulmonale: Secondary | ICD-10-CM | POA: Diagnosis not present

## 2015-10-18 DIAGNOSIS — K219 Gastro-esophageal reflux disease without esophagitis: Secondary | ICD-10-CM

## 2015-10-18 DIAGNOSIS — D509 Iron deficiency anemia, unspecified: Secondary | ICD-10-CM

## 2015-10-18 MED ORDER — NA SULFATE-K SULFATE-MG SULF 17.5-3.13-1.6 GM/177ML PO SOLN: 1.0000 | Freq: Once | ORAL | 0 refills | Status: AC

## 2015-10-18 NOTE — Patient Instructions (Signed)
You have been scheduled for an endoscopy and colonoscopy. Please follow the written instructions given to you at your visit today. Please pick up your prep supplies at the pharmacy within the next 1-3 days. If you use inhalers (even only as needed), please bring them with you on the day of your procedure. Your physician has requested that you go to www.startemmi.com and enter the access code given to you at your visit today. This web site gives a general overview about your procedure. However, you should still follow specific instructions given to you by our office regarding your preparation for the procedure.  If you are age 37 or older, your body mass index should be between 23-30. Your Body mass index is 25.52 kg/m. If this is out of the aforementioned range listed, please consider follow up with your Primary Care Provider.  If you are age 48 or younger, your body mass index should be between 19-25. Your Body mass index is 25.52 kg/m. If this is out of the aformentioned range listed, please consider follow up with your Primary Care Provider.   We have sent the following medications to your pharmacy for you to pick up at your convenience: Suprep  We will contact your cardiologist about your Xarelto. We will then give your directions.  Thank you for choosing [facility]  Dr Wilfrid Lund III

## 2015-10-18 NOTE — Progress Notes (Signed)
[facility] Consult Note:  History: Arthur [name] 10/18/2015  Referring physician: Lind Covert, MD  Reason for consult/chief complaint: Colonoscopy (pt has never had a colonoscopy; on blood thinner; no GI complaints at this time)   Subjective  HPI:  This is a 75 year old man initially referred to [place] for screening colonoscopy. This referral was sent in June of this year, however the patient had had a "submassive pulmonary embolism with right heart strain" according to a discharge summary in April of this year. He remains on Xarelto since then, and as near as he and his son no, he is to be on it indefinitely. Review of the discharge summary indicates that he was placed on heparin and thankfully had considerable improvement from that, and thus did not need surgery or interventional radiology. His hemoglobin was 11.2 upon that hospital admission, then dropped to 9.9 the next day. MCV was normal at that time. He then developed a microcytic anemia in the following months and was placed on iron supplements. His ferritin was 8 on August 9. He denies abdominal pain, altered bowel habits, rectal bleeding or melena, dysphagia, odynophagia, nausea or vomiting or weight loss.   ROS:  Review of Systems  Constitutional: Negative for appetite change and unexpected weight change.  HENT: Negative for mouth sores and voice change.   Eyes: Negative for pain and redness.  Respiratory: Negative for cough and shortness of breath.   Cardiovascular: Negative for chest pain and palpitations.  Genitourinary: Negative for dysuria and hematuria.  Musculoskeletal: Negative for arthralgias and myalgias.  Skin: Negative for pallor and rash.  Neurological: Negative for weakness and headaches.  Hematological: Negative for adenopathy.    He is very active, working part-time, denies chest pain or dyspnea with exertion.  Past Medical History: Past Medical History:  Diagnosis Date  .  Diabetes mellitus without complication ([place])   . High cholesterol   . Hypertension   . Normal nuclear stress test 2010   stress perfusion study apparently in 2010 in [facility] which he said was negative.     Past Surgical History: Past Surgical History:  Procedure Laterality Date  . CATARACT EXTRACTION, BILATERAL  2006  . KNEE SURGERY  2005     Family History: Family History  Problem Relation Age of Onset  . Coronary artery disease Father   . Coronary artery disease Mother     Social History: Social History   Social History  . Marital status: Single    Spouse name: N/A  . Number of children: N/A  . Years of education: N/A   Social History Main Topics  . Smoking status: Never Smoker  . Smokeless tobacco: Never Used  . Alcohol use No  . Drug use: No  . Sexual activity: Not Asked   Other Topics Concern  . None   Social History Narrative   Social History:   He works part-time as a Doctor, general practice. He is married with 3 children. He's never smoked cigarettes and does not drink alcohol.             Allergies: Allergies  Allergen Reactions  . Ace Inhibitors     REACTION: Cough    Outpatient Meds: Current Outpatient Prescriptions  Medication Sig Dispense Refill  . amLODipine (NORVASC) 10 MG tablet Take 1 tablet (10 mg total) by mouth at bedtime. 90 tablet 3  . atenolol (TENORMIN) 50 MG tablet Take 1 tablet (50 mg total) by mouth 2 (two) times daily. 180 tablet 3  .  ferrous sulfate 325 (65 FE) MG tablet Take 325 mg by mouth 3 (three) times daily with meals.    . hydrochlorothiazide (HYDRODIURIL) 25 MG tablet Take 1 tablet (25 mg total) by mouth daily. 90 tablet 3  . losartan (COZAAR) 100 MG tablet Take 1 tablet (100 mg total) by mouth daily. 90 tablet 3  . metFORMIN (GLUCOPHAGE) 1000 MG tablet Take 1 tablet by mouth twice a day with a meal 180 tablet 3  . omeprazole (PRILOSEC) 20 MG capsule Take 1 capsule (20 mg total) by mouth daily as needed. 90 capsule 1  .  pravastatin (PRAVACHOL) 40 MG tablet Take 1 tablet (40 mg total) by mouth daily. 90 tablet 3  . rivaroxaban (XARELTO) 20 MG TABS tablet Take 1 tablet (20 mg total) by mouth daily with supper. 90 tablet 3  . Na Sulfate-K Sulfate-Mg Sulf 17.5-3.13-1.6 GM/180ML SOLN Take 1 kit by mouth once. 354 mL 0   No current facility-administered medications for this visit.       ___________________________________________________________________ Objective   Exam:  BP (!) 130/58   Pulse 72   Ht 5' 11" (1.803 m)   Wt 183 lb (83 kg)   BMI 25.52 kg/m    General: this is a(n) Well-appearing elderly man . His son is with him today.  Eyes: sclera anicteric, no redness  ENT: oral mucosa moist without lesions, no cervical or supraclavicular lymphadenopathy, good dentition  CV: RRR without murmur, S1/S2, no JVD, no peripheral edema  Resp: clear to auscultation bilaterally, normal RR and effort noted  GI: soft, no tenderness, with active bowel sounds. No guarding or palpable organomegaly noted.  Skin; warm and dry, no rash or jaundice noted  Neuro: awake, alert and oriented x 3. Normal gross motor function and fluent speech  Labs:  Hgb 10.0   mcv 77 09/06/15.   Hgb 11.4  mcv 79   9/11  Radiologic Studies:  2-D echocardiogram during the April admission shows right heart strain with poor RV ejection fraction but normal LVEF. This was in the setting of the acute PE.  Assessment: Encounter Diagnoses  Name Primary?  Marland Kitchen Anemia, iron deficiency Yes  . Other pulmonary embolism without acute cor pulmonale, unspecified chronicity ([place])   . Gastroesophageal reflux disease, esophagitis presence not specified     While initially referred to [place] for screening, this patient actually has unexplained iron deficiency anemia. We discussed the need for a diagnostic workup.  Plan: Upper endoscopy and colonoscopy was recommended, and the patient is agreeable after a thorough discussion of procedure and risks.  He will need to be off Xarelto 2 days prior and perhaps up to several days afterwards as well if any polyps are removed. We will schedule in 3 or 4 weeks from now since he will then be at about the 6 month mark from his PE diagnosis. In addition, we will ask for an opinion from his primary care physician regarding this plan with the Xarelto.  Continue iron supplements in the meantime, as they appear to be improving his blood counts.  Thank you for the courtesy of this consult.  Please call me with any questions or concerns.  Nelida Meuse III  CC: Lind Covert, MD

## 2015-10-18 NOTE — Telephone Encounter (Signed)
10/18/2015   RE: Arthur [name] DOB: April 10, 1940 MRN: [id number]   Dear Dr Erin Hearing,    We have scheduled the above patient for an endoscopic procedure (colonoscopy). Our records show that he is on anticoagulation therapy.   Please advise as to how long the patient may come off his therapy of Xarelto prior to the procedure, which is scheduled for 10-30-2015.  Please fax back/ or route the completed form to Magdalene River, [place] at [phone].   Sincerely,    Magdalene River, CMA

## 2015-10-23 ENCOUNTER — Encounter: Payer: Self-pay | Admitting: Gastroenterology

## 2015-10-23 NOTE — Telephone Encounter (Signed)
Pt has been notified and aware of the directions from Dr Erin Hearing.He sates understanding.

## 2015-10-23 NOTE — Telephone Encounter (Signed)
Understood.  Thanks very much. - HD

## 2015-10-23 NOTE — Telephone Encounter (Signed)
He is almost 6 months out from his PE.  He should skip 2 days before the procedure.  I assume you will tell him when to resume based on what you find  Let me know if you need more information  Thanks for seeing him  [place]

## 2015-10-30 ENCOUNTER — Ambulatory Visit (AMBULATORY_SURGERY_CENTER): Payer: PPO | Admitting: Gastroenterology

## 2015-10-30 ENCOUNTER — Encounter: Payer: Self-pay | Admitting: Gastroenterology

## 2015-10-30 VITALS — BP 132/69 | HR 69 | Temp 98.4°F | Resp 17 | Ht 71.0 in | Wt 183.0 lb

## 2015-10-30 DIAGNOSIS — D122 Benign neoplasm of ascending colon: Secondary | ICD-10-CM

## 2015-10-30 DIAGNOSIS — I10 Essential (primary) hypertension: Secondary | ICD-10-CM | POA: Diagnosis not present

## 2015-10-30 DIAGNOSIS — E119 Type 2 diabetes mellitus without complications: Secondary | ICD-10-CM | POA: Diagnosis not present

## 2015-10-30 DIAGNOSIS — D500 Iron deficiency anemia secondary to blood loss (chronic): Secondary | ICD-10-CM | POA: Diagnosis not present

## 2015-10-30 DIAGNOSIS — D509 Iron deficiency anemia, unspecified: Secondary | ICD-10-CM | POA: Diagnosis not present

## 2015-10-30 DIAGNOSIS — K219 Gastro-esophageal reflux disease without esophagitis: Secondary | ICD-10-CM | POA: Diagnosis not present

## 2015-10-30 LAB — GLUCOSE, CAPILLARY
GLUCOSE-CAPILLARY: 126 mg/dL — AB (ref 65–99)
Glucose-Capillary: 155 mg/dL — ABNORMAL HIGH (ref 65–99)

## 2015-10-30 MED ORDER — SODIUM CHLORIDE 0.9 % IV SOLN: 500.0000 mL | INTRAVENOUS | Status: DC

## 2015-10-30 NOTE — Progress Notes (Signed)
Called to room to assist during endoscopic procedure.  Patient ID and intended procedure confirmed with present staff. Received instructions for my participation in the procedure from the performing physician.  

## 2015-10-30 NOTE — Progress Notes (Signed)
To PACU, vss patent aw report to rn 

## 2015-10-30 NOTE — Patient Instructions (Signed)
YOU HAD AN ENDOSCOPIC PROCEDURE TODAY AT [facility]:   Refer to the procedure report that was given to you for any specific questions about what was found during the examination.  If the procedure report does not answer your questions, please call your gastroenterologist to clarify.  If you requested that your care partner not be given the details of your procedure findings, then the procedure report has been included in a sealed envelope for you to review at your convenience later.  YOU SHOULD EXPECT: Some feelings of bloating in the abdomen. Passage of more gas than usual.  Walking can help get rid of the air that was put into your GI tract during the procedure and reduce the bloating. If you had a lower endoscopy (such as a colonoscopy or flexible sigmoidoscopy) you may notice spotting of blood in your stool or on the toilet paper. If you underwent a bowel prep for your procedure, you may not have a normal bowel movement for a few days.  Please Note:  You might notice some irritation and congestion in your nose or some drainage.  This is from the oxygen used during your procedure.  There is no need for concern and it should clear up in a day or so.  SYMPTOMS TO REPORT IMMEDIATELY:   Following lower endoscopy (colonoscopy or flexible sigmoidoscopy):  Excessive amounts of blood in the stool  Significant tenderness or worsening of abdominal pains  Swelling of the abdomen that is new, acute  Fever of 100F or higher   Following upper endoscopy (EGD)  Vomiting of blood or coffee ground material  New chest pain or pain under the shoulder blades  Painful or persistently difficult swallowing  New shortness of breath  Fever of 100F or higher  Black, tarry-looking stools  For urgent or emergent issues, a gastroenterologist can be reached at any hour by calling [phone].   DIET:  We do recommend a small meal at first, but then you may proceed to your regular diet.  Drink  plenty of fluids but you should avoid alcoholic beverages for 24 hours.  ACTIVITY:  You should plan to take it easy for the rest of today and you should NOT DRIVE or use heavy machinery until tomorrow (because of the sedation medicines used during the test).    FOLLOW UP: Our staff will call the number listed on your records the next business day following your procedure to check on you and address any questions or concerns that you may have regarding the information given to you following your procedure. If we do not reach you, we will leave a message.  However, if you are feeling well and you are not experiencing any problems, there is no need to return our call.  We will assume that you have returned to your regular daily activities without incident.  If any biopsies were taken you will be contacted by phone or by letter within the next 1-3 weeks.  Please call us at [phone] if you have not heard about the biopsies in 3 weeks.    SIGNATURES/CONFIDENTIALITY: You and/or your care partner have signed paperwork which will be entered into your electronic medical record.  These signatures attest to the fact that that the information above on your After Visit Summary has been reviewed and is understood.  Full responsibility of the confidentiality of this discharge information lies with you and/or your care-partner.  Polyps, diverticulosis, hemorrhoids-handouts given  Resume xarelto tomorrow 10/31/15.  Continue  iron supplements and follow-up with primary care for lab work.  Video capsule endoscopy will be scheduled at next available appointment, office will call with this appointment.

## 2015-10-30 NOTE — Op Note (Signed)
[place] Patient Name: Arthur [name] Procedure Date: 10/30/2015 2:41 PM MRN: [id number] Endoscopist: [place]. Loletha Carrow , MD Age: 75 Referring MD:  Date of Birth: Jan 27, 1941 Gender: Male Account #: [id number] Procedure:                Upper GI endoscopy Indications:              Unexplained iron deficiency anemia Medicines:                Monitored Anesthesia Care Procedure:                Pre-Anesthesia Assessment:                           - Prior to the procedure, a History and Physical                            was performed, and patient medications and                            allergies were reviewed. The patient's tolerance of                            previous anesthesia was also reviewed. The risks                            and benefits of the procedure and the sedation                            options and risks were discussed with the patient.                            All questions were answered, and informed consent                            was obtained. Prior Anticoagulants: The patient has                            taken Xarelto (rivaroxaban), last dose was 2 days                            prior to procedure. ASA Grade Assessment: III - A                            patient with severe systemic disease. After                            reviewing the risks and benefits, the patient was                            deemed in satisfactory condition to undergo the                            procedure.  After obtaining informed consent, the endoscope was                            passed under direct vision. Throughout the                            procedure, the patient's blood pressure, pulse, and                            oxygen saturations were monitored continuously. The                            Model GIF-HQ190 [id number]) scope was introduced                            through the mouth, and advanced to the second part                           of duodenum. The upper GI endoscopy was                            accomplished without difficulty. The patient                            tolerated the procedure well. Scope In: Scope Out: Findings:                 The esophagus was normal.                           The stomach was normal.                           The cardia and gastric fundus were normal on                            retroflexion.                           A large diverticulum was found in the second                            portion of the duodenum.                           The exam of the duodenum was otherwise normal. Complications:            No immediate complications. Estimated Blood Loss:     Estimated blood loss: none. Impression:               - Normal esophagus.                           - Normal stomach.                           - Normal examined duodenum.                           -  No specimens collected. Recommendation:           - Patient has a contact number available for                            emergencies. The signs and symptoms of potential                            delayed complications were discussed with the                            patient. Return to normal activities tomorrow.                            Written discharge instructions were provided to the                            patient.                           - Resume previous diet.                           - Resume Xarelto (rivaroxaban) at prior dose                            tomorrow.                           - See the other procedure note for documentation of                            additional recommendations. Ogden Handlin L. Loletha Carrow, MD 10/30/2015 3:12:43 PM This report has been signed electronically.

## 2015-10-30 NOTE — Op Note (Signed)
[place] Patient Name: Arthur [name] Procedure Date: 10/30/2015 2:40 PM MRN: [id number] Endoscopist: [place] Loletha Carrow , MD Age: 75 Referring MD:  Date of Birth: Jan 01, 1941 Gender: Male Account #: [id number] Procedure:                Colonoscopy Indications:              Unexplained iron deficiency anemia Medicines:                Monitored Anesthesia Care Procedure:                Pre-Anesthesia Assessment:                           - Prior to the procedure, a History and Physical                            was performed, and patient medications and                            allergies were reviewed. The patient's tolerance of                            previous anesthesia was also reviewed. The risks                            and benefits of the procedure and the sedation                            options and risks were discussed with the patient.                            All questions were answered, and informed consent                            was obtained. Prior Anticoagulants: The patient has                            taken Xarelto (rivaroxaban), last dose was 2 days                            prior to procedure. ASA Grade Assessment: III - A                            patient with severe systemic disease. After                            reviewing the risks and benefits, the patient was                            deemed in satisfactory condition to undergo the                            procedure.  After obtaining informed consent, the colonoscope                            was passed under direct vision. Throughout the                            procedure, the patient's blood pressure, pulse, and                            oxygen saturations were monitored continuously. The                            Model CF-[id number]) scope was introduced                            through the anus and advanced to the the cecum,                   identified by appendiceal orifice and ileocecal                            valve. The colonoscopy was performed without                            difficulty. The patient tolerated the procedure                            well. The quality of the bowel preparation was                            excellent. The ileocecal valve, appendiceal                            orifice, and rectum were photographed. The bowel                            preparation used was SUPREP. Scope In: 2:54:15 PM Scope Out: 3:08:59 PM Scope Withdrawal Time: 0 hours 10 minutes 12 seconds  Total Procedure Duration: 0 hours 14 minutes 44 seconds  Findings:                 The perianal and digital rectal examinations were                            normal.                           Multiple diverticula were found in the left colon                            and right colon.                           A 2 mm polyp was found in the proximal ascending  colon. The polyp was sessile. The polyp was removed                            with a cold biopsy forceps. Resection and retrieval                            were complete.                           Internal hemorrhoids were found during anoscopy.                            The hemorrhoids were medium-sized and Grade I                            (internal hemorrhoids that do not prolapse).                           The exam was otherwise without abnormality. Complications:            No immediate complications. Estimated Blood Loss:     Estimated blood loss: none. Impression:               - Diverticulosis in the left colon and in the right                            colon.                           - One 2 mm polyp in the proximal ascending colon,                            removed with a cold biopsy forceps. Resected and                            retrieved.                           - Internal hemorrhoids.                            - The examination was otherwise normal.                           No cause for iron deficiency anemia found on either                            EGD or colonoscopy. Recommendation:           - Patient has a contact number available for                            emergencies. The signs and symptoms of potential                            delayed complications were discussed with the  patient. Return to normal activities tomorrow.                            Written discharge instructions were provided to the                            patient.                           - Resume previous diet.                           - Continue present medications.                           - Resume Xarelto (rivaroxaban) at prior dose                            tomorrow.                           - Await pathology results.                           - Repeat colonoscopy is recommended for                            surveillance. The colonoscopy date will be                            determined after pathology results from today's                            exam become available for review.                           - Continue iron tablets and follow up with primary                            care provider as scheduled to check blood counts.                           - To visualize the small bowel, perform video                            capsule endoscopy at the next available appointment. Rianne Degraaf L. Loletha Carrow, MD 10/30/2015 3:18:15 PM This report has been signed electronically.

## 2015-10-31 ENCOUNTER — Telehealth: Payer: Self-pay | Admitting: *Deleted

## 2015-10-31 NOTE — Telephone Encounter (Signed)
  Follow up Call-  Call back number 10/30/2015  Post procedure Call Back phone  # [phone]  home  Permission to leave phone message Yes  Some recent data might be hidden     Patient questions:  Do you have a fever, pain , or abdominal swelling? No. Pain Score  0 *  Have you tolerated food without any problems? Yes.    Have you been able to return to your normal activities? Yes.    Do you have any questions about your discharge instructions: Diet   No. Medications  No. Follow up visit  No.  Do you have questions or concerns about your Care? No.  Actions: * If pain score is 4 or above: No action needed, pain <4.

## 2015-11-03 ENCOUNTER — Encounter: Payer: Self-pay | Admitting: Student

## 2015-11-03 ENCOUNTER — Ambulatory Visit (INDEPENDENT_AMBULATORY_CARE_PROVIDER_SITE_OTHER): Payer: PPO | Admitting: Student

## 2015-11-03 VITALS — BP 138/68 | HR 72 | Temp 98.3°F | Ht 71.0 in | Wt 183.2 lb

## 2015-11-03 DIAGNOSIS — E1169 Type 2 diabetes mellitus with other specified complication: Secondary | ICD-10-CM | POA: Diagnosis not present

## 2015-11-03 DIAGNOSIS — R42 Dizziness and giddiness: Secondary | ICD-10-CM | POA: Diagnosis not present

## 2015-11-03 LAB — GLUCOSE, CAPILLARY: Glucose-Capillary: 130 mg/dL — ABNORMAL HIGH (ref 65–99)

## 2015-11-03 MED ORDER — MECLIZINE HCL 12.5 MG PO TABS: 12.5000 mg | ORAL_TABLET | Freq: Two times a day (BID) | ORAL | 0 refills | Status: DC | PRN

## 2015-11-03 NOTE — Assessment & Plan Note (Signed)
Unclear etiology. Symptom has resolved now. Differential diagnosis are vestibular neuritis, BPPV, labyrinthitis or Mnire's disease. She is not on any ototoxic medication. Isolated vertigo without hearing loss is likely vestibular neuritis. However, he has no nausea or vomiting. Negative Dix-Hallpike test makes a BPPV unlikely. Labyrinthitis and Mnire's disease are also unlikely without hearing loss. CBG is 130. Vital signs are within normal limits. The nature of his vertigo doesn't suggest syncope.  -Gave paper prescription for meclizine 12.5 mg as needed for vertigo. Discussed about the side effects and advised to fill the prescription only if he has symptom again. Discussed return precautions.

## 2015-11-03 NOTE — Patient Instructions (Signed)
It was great seeing you today! We have addressed the following issues today  1. Dizziness (vertigo): Unclear what is causing this at this time. This unlikely to be life-threatening given you exam. I recommend trying meclizine 12.5 mg up to twice a day only as needed for dizziness. This medicine might make her sleepy and drowsy. Please be cautious if you're drive or operate machinery. Come back and see [place] if your symptoms won't improve or if you have other symptoms concerning to you.     If we did any lab work today, and the results require attention, either me or my nurse will get in touch with you. If everything is normal, you will get a letter in mail. If you don't hear from [place] in two weeks, please give [place] a call. Otherwise, I look forward to talking with you again at our next visit. If you have any questions or concerns before then, please call the clinic at [phone].  Please bring all your medications to every doctors visit   Sign up for My Chart to have easy access to your labs results, and communication with your Primary care physician.    Please check-out at the front desk before leaving the clinic.   Take Care,

## 2015-11-03 NOTE — Progress Notes (Signed)
   Subjective:    Patient ID: Arthur [name] is a 75 y.o. old male. Patient here accompanied by his daughter-in-law who has also contributed to history.  HPI #Dizziness: since this morning. About 6:30 AM this morning, he got up to sit in bed and felt dizzy. He describes its as  things spinning around. He is scared he might fall. Denies head trauma or fall. His last symptom was when he arrived at our clinic. His symptom has resolved now. Denies changes to his hearing. Reports chills this morning. Denies runny nose, congestion, cough, shortness of breath, chest pain, fever, nasuea, emesis, diarrhea or urinary changes. No neurologic symptoms. No new med or sick contact.  Patient has similar symptom about 2 years ago and he was given meclizine and his symptom resolved.   PMH: reviewed  SH: Denies smoking, EtOH, drug use  Review of Systems Per HPI Objective:   Vitals:   11/03/15 0925  BP: 138/68  Pulse: 72  Temp: 98.3 F (36.8 C)  TempSrc: Oral  Weight: 183 lb 3.2 oz (83.1 kg)  Height: '5\' 11"'$  (1.803 m)    GEN: appears well well, no apparent distress. Able to get on examination table by himself HEENT:   Head: normocephalic and atraumatic   Eyes: without conjunctival injection, sclera anicteric  Ears: normal TM and ear canal   Nares: no rhinorrhea. Mild erythema and congestion of turbinates on the right compared to left  Oropharynx: mmm without erythema or exudation CVS: RRR, normal s1 and s2, no murmurs, no edema RESP: no increased work of breathing, good air movement bilaterally, no crackles or wheeze GI: Bowel sounds present and normal, soft, non-tender,non-distended HEM: No cervical lymphadenopathy NEURO: alert and oriented appropriately, normal speech, chronic left 2-12 intact, motor 5 out of 5 in all extremities, sensation intact in all dermatomes, biceps and patellar reflex 2+ bilaterally, finger-to-nose within normal limits, no nystagmus. Dix-Hallpike test is negative.    PSYCH: appropriate mood and affect     Assessment & Plan:  Vertigo Unclear etiology. Symptom has resolved now. Differential diagnosis are vestibular neuritis, BPPV, labyrinthitis or Mnire's disease. She is not on any ototoxic medication. Isolated vertigo without hearing loss is likely vestibular neuritis. However, he has no nausea or vomiting. Negative Dix-Hallpike test makes a BPPV unlikely. Labyrinthitis and Mnire's disease are also unlikely without hearing loss. CBG is 130. Vital signs are within normal limits. The nature of his vertigo doesn't suggest syncope.  -Gave paper prescription for meclizine 12.5 mg as needed for vertigo. Discussed about the side effects and advised to fill the prescription only if he has symptom again. Discussed return precautions.

## 2015-11-03 NOTE — Progress Notes (Deleted)
   Subjective:    Patient ID: Arthur [name] is a 75 y.o. old male.  HPI #  #  PMH: reviewed  [place]: ***  SH: ***  Review of Systems Per HPI Objective:   Vitals:   11/03/15 0925  BP: 138/68  Pulse: 72  Temp: 98.3 F (36.8 C)  TempSrc: Oral  Weight: 183 lb 3.2 oz (83.1 kg)  Height: '5\' 11"'$  (1.803 m)    GEN: appears well***, no apparent distress. HEENT:   Head: normocephalic and atraumatic   Eyes: without conjunctival injection, sclera anicteric  Ears: normal TM and ear canal,   Nares: ***rhinorrhea, congestion or erythema,   Oropharynx: mmm without erythema or exudation CVS: RRR, normal s1 and s2, no murmurs, no edema RESP: no increased work of breathing, good air movement bilaterally, no crackles or wheeze GI: Bowel sounds present and normal, soft, non-tender,non-distended GU: *** MSK:*** SKIN: *** ENDO: *** HEM: *** NEURO: alert and oriented appropriately, no gross defecits  PSYCH: appropriate mood and affect       Assessment & Plan:  No problem-specific Assessment & Plan notes found for this encounter.

## 2015-11-07 ENCOUNTER — Telehealth: Payer: Self-pay | Admitting: Family Medicine

## 2015-11-07 NOTE — Telephone Encounter (Signed)
Feeling better Ok to take [employer] as needed

## 2015-11-07 NOTE — Telephone Encounter (Signed)
Pt states meclizine isn't working and would like to try something else. Please advise. Thanks! ep

## 2015-11-08 ENCOUNTER — Encounter: Payer: Self-pay | Admitting: Gastroenterology

## 2015-11-09 ENCOUNTER — Other Ambulatory Visit: Payer: Self-pay | Admitting: Student

## 2015-11-09 DIAGNOSIS — R42 Dizziness and giddiness: Secondary | ICD-10-CM

## 2015-11-15 ENCOUNTER — Encounter: Payer: Self-pay | Admitting: Family Medicine

## 2015-11-15 ENCOUNTER — Ambulatory Visit (INDEPENDENT_AMBULATORY_CARE_PROVIDER_SITE_OTHER): Payer: PPO | Admitting: Family Medicine

## 2015-11-15 VITALS — BP 141/65 | HR 73 | Temp 97.8°F | Wt 183.0 lb

## 2015-11-15 DIAGNOSIS — E538 Deficiency of other specified B group vitamins: Secondary | ICD-10-CM

## 2015-11-15 DIAGNOSIS — I10 Essential (primary) hypertension: Secondary | ICD-10-CM

## 2015-11-15 DIAGNOSIS — R42 Dizziness and giddiness: Secondary | ICD-10-CM | POA: Diagnosis not present

## 2015-11-15 DIAGNOSIS — D649 Anemia, unspecified: Secondary | ICD-10-CM

## 2015-11-15 DIAGNOSIS — Z79899 Other long term (current) drug therapy: Secondary | ICD-10-CM | POA: Diagnosis not present

## 2015-11-15 DIAGNOSIS — I2699 Other pulmonary embolism without acute cor pulmonale: Secondary | ICD-10-CM

## 2015-11-15 DIAGNOSIS — Z125 Encounter for screening for malignant neoplasm of prostate: Secondary | ICD-10-CM

## 2015-11-15 LAB — BASIC METABOLIC PANEL WITH GFR
BUN: 16 mg/dL (ref 7–25)
CHLORIDE: 100 mmol/L (ref 98–110)
CO2: 25 mmol/L (ref 20–31)
Calcium: 9.2 mg/dL (ref 8.6–10.3)
Creat: 1.18 mg/dL (ref 0.70–1.18)
GFR, EST AFRICAN AMERICAN: 69 mL/min (ref >=60)
GFR, EST NON AFRICAN AMERICAN: 60 mL/min (ref >=60)
Glucose, Bld: 127 mg/dL — ABNORMAL HIGH (ref 65–99)
POTASSIUM: 4.3 mmol/L (ref 3.5–5.3)
SODIUM: 136 mmol/L (ref 135–146)

## 2015-11-15 MED ORDER — MECLIZINE HCL 12.5 MG PO TABS: ORAL_TABLET | ORAL | 0 refills | Status: DC

## 2015-11-15 NOTE — Assessment & Plan Note (Signed)
Unsure of cause.  Improving on iron.  Upper and lower endo unrevealing.   He is reluctant to have capsule endoscopy.  Will recheck

## 2015-11-15 NOTE — Progress Notes (Signed)
Subjective  Patient is presenting with the following illnesses  Pulmonary Emboli No symptoms.  No bleeding and taking anticoagulation regularly.  GI work up unrevealing.  No weight loss or fever   Dizziness Has intermittently when awakening.  Resolves after a few minutes of head movement.  Does not have during the day.  No visual changes or weakness.  Takes meclizine rarely  Anemia No bleeding or melena or lightheadness or chest pain.  Taking iron daily   Chief Complaint noted Review of Symptoms - see HPI PMH - Smoking status noted.     Objective Vital Signs reviewed Heart - Regular rate and rhythm.  No murmurs, gallops or rubs.    Lungs:  Normal respiratory effort, chest expands symmetrically. Lungs are clear to auscultation, no crackles or wheezes. Moves around room and clnic normally    Assessments/Plans  No problem-specific Assessment & Plan notes found for this encounter.   See Encounter view if individual problem A/Ps not visible See after visit summary for details of patient instuctions

## 2015-11-15 NOTE — Assessment & Plan Note (Signed)
Stable.  Discussed with he and son that would treat for 1 year then reassess

## 2015-11-15 NOTE — Assessment & Plan Note (Signed)
Intermittent.  Sounds consistent with BPPV.  Discussed exercises

## 2015-11-15 NOTE — Patient Instructions (Signed)
Good to see you today!  Thanks for coming in.  Keep taking all the medications as you are  I will talk with your stomach doctor about the capsule test  Come back in January  I will call you if your tests are not good.  Otherwise I will send you a letter.  If you do not hear from me with in 2 weeks please call our office.

## 2015-11-16 ENCOUNTER — Encounter: Payer: Self-pay | Admitting: Family Medicine

## 2015-11-16 DIAGNOSIS — E538 Deficiency of other specified B group vitamins: Secondary | ICD-10-CM | POA: Insufficient documentation

## 2015-11-16 LAB — VITAMIN B12: VITAMIN B 12: 182 pg/mL — AB (ref 200–1100)

## 2015-11-16 LAB — PSA: PSA: 0.8 ng/mL (ref <=4.0)

## 2015-11-16 NOTE — Progress Notes (Signed)
Subjective  Patient is presenting with the following illnesses     Chief Complaint noted Review of Symptoms - see HPI PMH - Smoking status noted.     Objective Vital Signs reviewed     Assessments/Plans  No problem-specific Assessment & Plan notes found for this encounter.   See Encounter view if individual problem A/Ps not visible See after visit summary for details of patient instuctions 

## 2015-11-16 NOTE — Assessment & Plan Note (Signed)
Likely due to meformin.  Sent letter to take otc supplement.

## 2015-12-05 NOTE — Progress Notes (Signed)
Thanks for your note. Small bowel malignancy is uncommon, but it is one of the things worth looking for on a SB capsule study.  More common are distal SB ulcers and AVMs.  My advice to him is to proceed with capsule study. Hope that is helpful.  Mallie Mussel

## 2015-12-07 ENCOUNTER — Encounter: Payer: Self-pay | Admitting: Family Medicine

## 2015-12-19 ENCOUNTER — Telehealth: Payer: Self-pay | Admitting: Family Medicine

## 2015-12-19 NOTE — Telephone Encounter (Signed)
Will forward to PCP.  Saki Legore L, RN  

## 2015-12-19 NOTE — Telephone Encounter (Signed)
If he needs something stronger than tylenol he would have to be seen to see if he needs and xray  Thanks  Clymer

## 2015-12-19 NOTE — Telephone Encounter (Signed)
Pt hurt his foot and would like something for pain. Offered to schedule a same day, pt declined. Please advise. Thanks! ep

## 2015-12-25 NOTE — Telephone Encounter (Signed)
Called pt and he is feeling much better, he is just going to keep his scheduled apt with Dr. Erin Hearing.

## 2015-12-27 ENCOUNTER — Other Ambulatory Visit: Payer: Self-pay | Admitting: Family Medicine

## 2015-12-27 ENCOUNTER — Ambulatory Visit (INDEPENDENT_AMBULATORY_CARE_PROVIDER_SITE_OTHER): Payer: PPO | Admitting: Family Medicine

## 2015-12-27 DIAGNOSIS — M25511 Pain in right shoulder: Secondary | ICD-10-CM

## 2015-12-27 MED ORDER — DICLOFENAC SODIUM 1 % TD GEL: 2.0000 g | Freq: Two times a day (BID) | TRANSDERMAL | 1 refills | Status: DC | PRN

## 2015-12-27 NOTE — Assessment & Plan Note (Signed)
DJD flare.  See after visit summary for plan

## 2015-12-27 NOTE — Progress Notes (Signed)
Subjective  Patient is presenting with the following illnesses  Right Shoulder pain Comes and goes mainly after he has slept on that side.  Better in the afternoon.  Not taking any medications. No weakness or redness or injury   Chief Complaint noted Review of Symptoms - see HPI PMH - Smoking status noted.     Objective Vital Signs reviewed Alert nad R shoulder - FROM.  No focal tenderness or deformity.  Strenght is normal as are distal pulses and grip     Assessments/Plans  No problem-specific Assessment & Plan notes found for this encounter.   See Encounter view if individual problem A/Ps not visible See after visit summary for details of patient instuctions

## 2015-12-27 NOTE — Patient Instructions (Signed)
Good to see you today!  Thanks for coming in.  Use tylenol extra strength 2 tabs up to three times a day as needed for pain  Can also use Apercreme rub over the counter twice a day as needed  If not helping enough I sent in a prescription for Diclofenac rub twice a day as needed  Come back in January for a diabetes check

## 2016-01-08 ENCOUNTER — Other Ambulatory Visit: Payer: Self-pay | Admitting: Family Medicine

## 2016-01-31 ENCOUNTER — Ambulatory Visit (INDEPENDENT_AMBULATORY_CARE_PROVIDER_SITE_OTHER): Payer: PPO | Admitting: Family Medicine

## 2016-01-31 ENCOUNTER — Encounter: Payer: Self-pay | Admitting: Family Medicine

## 2016-01-31 VITALS — BP 130/60 | HR 74 | Temp 97.9°F | Ht 71.0 in | Wt 185.0 lb

## 2016-01-31 DIAGNOSIS — I2699 Other pulmonary embolism without acute cor pulmonale: Secondary | ICD-10-CM

## 2016-01-31 DIAGNOSIS — E782 Mixed hyperlipidemia: Secondary | ICD-10-CM

## 2016-01-31 DIAGNOSIS — I10 Essential (primary) hypertension: Secondary | ICD-10-CM

## 2016-01-31 DIAGNOSIS — E119 Type 2 diabetes mellitus without complications: Secondary | ICD-10-CM | POA: Diagnosis not present

## 2016-01-31 LAB — POCT GLYCOSYLATED HEMOGLOBIN (HGB A1C): HEMOGLOBIN A1C: 6.9

## 2016-01-31 MED ORDER — RIVAROXABAN 20 MG PO TABS: 20.0000 mg | ORAL_TABLET | Freq: Every day | ORAL | 3 refills | Status: DC

## 2016-01-31 NOTE — Patient Instructions (Addendum)
Good to see you today!  Thanks for coming in  I would aim to lose about 5-10 pounds  Your diabetes is good  I will look for the coupon for the Xarelto for [facility]  Come back in 3 months

## 2016-01-31 NOTE — Progress Notes (Signed)
Subjective  Patient is presenting with the following illnesses     Chief Complaint noted Review of Symptoms - see HPI PMH - Smoking status noted.     Objective Vital Signs reviewed     Assessments/Plans  No problem-specific Assessment & Plan notes found for this encounter.   See Encounter view if individual problem A/Ps not visible See after visit summary for details of patient instuctions 

## 2016-01-31 NOTE — Assessment & Plan Note (Signed)
BP Readings from Last 3 Encounters:  01/31/16 130/60  12/27/15 (!) 142/62  11/15/15 (!) 141/65    At goal continue medications

## 2016-01-31 NOTE — Progress Notes (Signed)
Subjective  Patient is presenting with the following illnesses  HYPERTENSION Disease Monitoring: Blood pressure range-not checking Chest pain, palpitations- no      Dyspnea- no Medications: Compliance- daily meds Lightheadedness,Syncope- no   Edema- no  DIABETES Disease Monitoring: Blood Sugar ranges-not checking Polyuria/phagia/dipsia- no      Visual problems- no Medications: Compliance- daily Hypoglycemic symptoms- no  HYPERLIPIDEMIA Disease Monitoring: See symptoms for Hypertension Medications: Compliance- pravachol daily Right upper quadrant pain- no  Muscle aches- no  Monitoring Labs and Parameters Last A1C:  Lab Results  Component Value Date   HGBA1C 6.9 01/31/2016    Last Lipid:     Component Value Date/Time   CHOL 163 03/20/2015 1033   HDL 43 03/20/2015 1033    Last Bmet  Potassium  Date Value Ref Range Status  11/15/2015 4.3 3.5 - 5.3 mmol/L Final   Sodium  Date Value Ref Range Status  11/15/2015 136 135 - 146 mmol/L Final   Creat  Date Value Ref Range Status  11/15/2015 1.18 0.70 - 1.18 mg/dL Final    Comment:      For patients > or = 76 years of age: The upper reference limit for Creatinine is approximately 13% higher for people identified as African-American.         Last BPs:  BP Readings from Last 3 Encounters:  01/31/16 130/60  12/27/15 (!) 142/62  11/15/15 (!) 141/65        Chief Complaint noted Review of Symptoms - see HPI PMH - Smoking status noted.     Objective Vital Signs reviewed Psych:  Cognition and judgment appear intact. Alert, communicative  and cooperative with normal attention span and concentration. No apparent delusions, illusions, hallucinations     Assessments/Plans  No problem-specific Assessment & Plan notes found for this encounter.   See Encounter view if individual problem A/Ps not visible See after visit summary for details of patient instuctions

## 2016-01-31 NOTE — Assessment & Plan Note (Signed)
At goal tolerating medications well

## 2016-01-31 NOTE — Assessment & Plan Note (Signed)
Stable on anticoagulation.  No signs of cancer.   Will revisit anticoagulation after one year of therapy

## 2016-01-31 NOTE — Assessment & Plan Note (Signed)
Well controlled 

## 2016-03-27 ENCOUNTER — Ambulatory Visit (INDEPENDENT_AMBULATORY_CARE_PROVIDER_SITE_OTHER): Payer: PPO | Admitting: Family Medicine

## 2016-03-27 DIAGNOSIS — M25561 Pain in right knee: Secondary | ICD-10-CM

## 2016-03-27 NOTE — Progress Notes (Signed)
Subjective  Patient is presenting with the following illnesses  Right Knee Pain Has had on and off with history of arthroscopy in past. Worse after waling.  Better with voltaren gel.  No locking or swelllig or fever or rash.  No recent trauma.  Wonders about a Programmer, multimedia Complaint noted Review of Symptoms - see HPI PMH - Smoking status noted.     Objective Vital Signs reviewed alet nad R Knee - FROM, moderate crepitus.  No effusion or warmth or redness.  Well healed scars Good ROM without pain or hip and ankle on R    Assessments/Plans  No problem-specific Assessment & Plan notes found for this encounter.   See Encounter view if individual problem A/Ps not visible See after visit summary for details of patient instuctions

## 2016-03-27 NOTE — Patient Instructions (Addendum)
Good to see you today!  Thanks for coming in.  For your knee - use the voltaren gel before you walk a lot and then after about 6 hours later  You can also take Tylenol as needed for pain   Come back the end of April or early May for a diabetes check and to discuss your blood thinner

## 2016-03-28 DIAGNOSIS — M25561 Pain in right knee: Secondary | ICD-10-CM | POA: Insufficient documentation

## 2016-03-28 NOTE — Assessment & Plan Note (Signed)
Flare of OA.  No signs of infection or gout or fracture.  Treat with voltaren gel before and after exercise and tylenol as needed

## 2016-05-15 ENCOUNTER — Encounter: Payer: Self-pay | Admitting: Family Medicine

## 2016-05-15 ENCOUNTER — Ambulatory Visit (INDEPENDENT_AMBULATORY_CARE_PROVIDER_SITE_OTHER): Payer: PPO | Admitting: Family Medicine

## 2016-05-15 VITALS — BP 132/62 | HR 77 | Temp 97.7°F | Ht 71.0 in | Wt 179.2 lb

## 2016-05-15 DIAGNOSIS — E782 Mixed hyperlipidemia: Secondary | ICD-10-CM

## 2016-05-15 DIAGNOSIS — I2699 Other pulmonary embolism without acute cor pulmonale: Secondary | ICD-10-CM

## 2016-05-15 DIAGNOSIS — E1169 Type 2 diabetes mellitus with other specified complication: Secondary | ICD-10-CM | POA: Diagnosis not present

## 2016-05-15 DIAGNOSIS — I10 Essential (primary) hypertension: Secondary | ICD-10-CM | POA: Diagnosis not present

## 2016-05-15 LAB — POCT GLYCOSYLATED HEMOGLOBIN (HGB A1C): Hemoglobin A1C: 6.6

## 2016-05-15 MED ORDER — BENZONATATE 100 MG PO CAPS: 100.0000 mg | ORAL_CAPSULE | Freq: Two times a day (BID) | ORAL | 0 refills | Status: DC | PRN

## 2016-05-15 MED ORDER — LORATADINE 10 MG PO TABS: 10.0000 mg | ORAL_TABLET | Freq: Every day | ORAL | 3 refills | Status: DC

## 2016-05-15 NOTE — Patient Instructions (Addendum)
Good to see you today!  Thanks for coming in.   For Allergies  Take Loratadine once a day - should improve in 1-2 weeks   Finish the all the xarelto you have then stat Asprin 81 mg every day.  Do not take together  If you have any chest pain or shortness of breath or leg swelling call immediately   Come back in 3 months   For Mrs Arthur [name]  She has anemia  She needs to ask her gastroenterologist to send me records  She should come in for blood tests tomorrow    If she feels very tired or lightheadness she should come in immediately  She should take iron tablets twice a day Ferrous Sulfate 325 mg and colace stool  softner every day

## 2016-05-15 NOTE — Assessment & Plan Note (Signed)
Well controlled 

## 2016-05-15 NOTE — Progress Notes (Signed)
Subjective  Patient is presenting with the following illnesses  HYPERTENSION Disease Monitoring: Blood pressure range-not checking Chest pain, palpitations- no      Dyspnea- no Medications: Compliance- daily Lightheadedness,Syncope- no   Edema- no  DIABETES Disease Monitoring: Blood Sugar ranges-not checking Polyuria/phagia/dipsia- no      Visual problems- no Medications: Compliance- daily Hypoglycemic symptoms- no  HYPERLIPIDEMIA Disease Monitoring: See symptoms for Hypertension Medications: Compliance- pravastatin daily  Right upper quadrant pain- no  Muscle aches- no  Monitoring Labs and Parameters Last A1C:  Lab Results  Component Value Date   HGBA1C 6.6 05/15/2016    Last Lipid:     Component Value Date/Time   CHOL 163 03/20/2015 1033   HDL 43 03/20/2015 1033    Last Bmet  Potassium  Date Value Ref Range Status  11/15/2015 4.3 3.5 - 5.3 mmol/L Final   Sodium  Date Value Ref Range Status  11/15/2015 136 135 - 146 mmol/L Final   Creat  Date Value Ref Range Status  11/15/2015 1.18 0.70 - 1.18 mg/dL Final    Comment:      For patients > or = 76 years of age: The upper reference limit for Creatinine is approximately 13% higher for people identified as African-American.         Last BPs:  BP Readings from Last 3 Encounters:  05/15/16 132/62  03/27/16 138/72  01/31/16 130/60   RHINITIS Disease Monitoring Main symptoms:   Nasal stuffiness cough  Worse during:  spring Dyspnea: no   Nasal Bleeding:  no  Medication Monitoring Compliance:  Not taking any antihistamine   Chief Complaint noted Review of Symptoms - see HPI PMH - Smoking status noted.     Objective Vital Signs reviewed Lungs:  Normal respiratory effort, chest expands symmetrically. Lungs are clear to auscultation, no crackles or wheezes. Heart - Regular rate and rhythm.  No murmurs, gallops or rubs.    Diabetic Foot Check -  Appearance - no lesions, ulcers or calluses  6 toes on  right Skin - no unusual pallor or redness Monofilament testing -  Right - Great toe, medial, central, lateral ball and posterior foot intact Left - Great toe, medial, central, lateral ball and posterior foot intact     Assessments/Plans  No problem-specific Assessment & Plan notes found for this encounter.   See Encounter view if individual problem A/Ps not visible See after visit summary for details of patient instuctions

## 2016-05-15 NOTE — Assessment & Plan Note (Signed)
Discussed with him and his daughter who is an Therapist, sports.  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  See after visit summary

## 2016-05-17 ENCOUNTER — Other Ambulatory Visit: Payer: Self-pay | Admitting: Family Medicine

## 2016-06-15 ENCOUNTER — Other Ambulatory Visit: Payer: Self-pay | Admitting: Family Medicine

## 2016-06-17 ENCOUNTER — Other Ambulatory Visit: Payer: Self-pay | Admitting: Family Medicine

## 2016-07-24 ENCOUNTER — Ambulatory Visit (INDEPENDENT_AMBULATORY_CARE_PROVIDER_SITE_OTHER): Payer: PPO | Admitting: Family Medicine

## 2016-07-24 DIAGNOSIS — D649 Anemia, unspecified: Secondary | ICD-10-CM | POA: Diagnosis not present

## 2016-07-24 DIAGNOSIS — E538 Deficiency of other specified B group vitamins: Secondary | ICD-10-CM | POA: Diagnosis not present

## 2016-07-24 DIAGNOSIS — K219 Gastro-esophageal reflux disease without esophagitis: Secondary | ICD-10-CM

## 2016-07-24 DIAGNOSIS — E782 Mixed hyperlipidemia: Secondary | ICD-10-CM

## 2016-07-24 NOTE — Assessment & Plan Note (Signed)
Stable no red flags Using ppi as needed

## 2016-07-24 NOTE — Assessment & Plan Note (Signed)
Asymptomatic on iron - check labs

## 2016-07-24 NOTE — Assessment & Plan Note (Signed)
Mildly low a year ago.  Stop B12 and will follow up in 6 months

## 2016-07-24 NOTE — Patient Instructions (Addendum)
Good to see you today!  Thanks for coming in.  Stop taking the iron and the B12 vitamins  Take the omeprazole only as needed for heart burn  Start lifting hand weights - do about 10 repetitions twice every other day.     I will call you if your tests are not good.  Otherwise I will send you a letter.  If you do not hear from me with in 2 weeks please call our office.

## 2016-07-24 NOTE — Progress Notes (Signed)
Subjective  Patient is presenting with the following illnesses  ANEMIA Takes iron daily.  No seen bleeding.  No lightheadness or shortness of breath   GERD Disease Monitoring Typical Symptoms:  Mild heart burn sometimes    Timing - intermittent Duration - years  Bleeding (hematemesis/melena):  no  Food sticking (severity):  no  Weight Loss:  no  Medication Monitoring (modifying factors) Compliance:  Takes as needed   Recent Pneumonia  no    Have attempted taking only as needed (context) yes   HYPERLIPIDEMIA Symptoms Chest pain on exertion:  no   Leg claudication:   no Medications (modifying factor): Compliance- daily pravastatin  Right upper quadrant pain- no  Muscle aches- no Duration - years   Timing - continuous    Component Value Date/Time   CHOL 163 03/20/2015 1033   TRIG 152 (H) 03/20/2015 1033   HDL 43 03/20/2015 1033   VLDL 30 03/20/2015 1033   CHOLHDL 3.8 03/20/2015 1033     Chief Complaint noted Review of Symptoms - see HPI PMH - Smoking status noted.     Objective Vital Signs reviewed Heart - Regular rate and rhythm.  No murmurs, gallops or rubs.    Lungs:  Normal respiratory effort, chest expands symmetrically. Lungs are clear to auscultation, no crackles or wheezes. Extremities:  No cyanosis, edema, or deformity noted with good range of motion of all major joints.       Assessments/Plans  No problem-specific Assessment & Plan notes found for this encounter.   See Encounter view if individual problem A/Ps not visible See after visit summary for details of patient instuctions

## 2016-07-24 NOTE — Assessment & Plan Note (Signed)
Stable Will check lipids

## 2016-07-25 ENCOUNTER — Encounter: Payer: Self-pay | Admitting: Family Medicine

## 2016-07-25 LAB — LIPID PANEL
CHOL/HDL RATIO: 3.4 ratio (ref 0.0–5.0)
Cholesterol, Total: 145 mg/dL (ref 100–199)
HDL: 43 mg/dL (ref >39)
LDL CALC: 69 mg/dL (ref 0–99)
TRIGLYCERIDES: 163 mg/dL — AB (ref 0–149)
VLDL CHOLESTEROL CAL: 33 mg/dL (ref 5–40)

## 2016-07-25 LAB — CBC
HEMATOCRIT: 42.1 % (ref 37.5–51.0)
HEMOGLOBIN: 13.7 g/dL (ref 13.0–17.7)
MCH: 28.5 pg (ref 26.6–33.0)
MCHC: 32.5 g/dL (ref 31.5–35.7)
MCV: 88 fL (ref 79–97)
Platelets: 283 10*3/uL (ref 150–379)
RBC: 4.81 x10E6/uL (ref 4.14–5.80)
RDW: 13.6 % (ref 12.3–15.4)
WBC: 8.1 10*3/uL (ref 3.4–10.8)

## 2016-08-22 ENCOUNTER — Other Ambulatory Visit: Payer: Self-pay | Admitting: Family Medicine

## 2016-09-04 ENCOUNTER — Encounter: Payer: Self-pay | Admitting: Family Medicine

## 2016-09-04 ENCOUNTER — Ambulatory Visit (INDEPENDENT_AMBULATORY_CARE_PROVIDER_SITE_OTHER): Payer: PPO | Admitting: Family Medicine

## 2016-09-04 VITALS — BP 130/78 | HR 70 | Temp 98.1°F | Ht 71.0 in | Wt 177.0 lb

## 2016-09-04 DIAGNOSIS — I10 Essential (primary) hypertension: Secondary | ICD-10-CM

## 2016-09-04 DIAGNOSIS — R059 Cough, unspecified: Secondary | ICD-10-CM

## 2016-09-04 DIAGNOSIS — R05 Cough: Secondary | ICD-10-CM | POA: Diagnosis not present

## 2016-09-04 DIAGNOSIS — E1169 Type 2 diabetes mellitus with other specified complication: Secondary | ICD-10-CM

## 2016-09-04 LAB — POCT GLYCOSYLATED HEMOGLOBIN (HGB A1C): HEMOGLOBIN A1C: 6.4

## 2016-09-04 MED ORDER — LORATADINE 10 MG PO TABS: 10.0000 mg | ORAL_TABLET | Freq: Every day | ORAL | 0 refills | Status: DC

## 2016-09-04 NOTE — Progress Notes (Signed)
Subjective  Patient is presenting with the following illnesses  COUGH  Has been coughing for 10-12 days. Cough is: worse with going in and outside Sputum production: white no change in color Medications tried: tessalon did not help Taking blood pressure medications: yes  Symptoms Runny nose: mild Mucous in back of throat: mild Throat burning or reflux: no Wheezing or asthma: no Fever: no Chest Pain: no Shortness of breath: no Leg swelling: no Hemoptysis: no Weight loss: has lost 5 lbs has been trying with die  DIABETES Disease Monitoring: Blood Sugar ranges(Severity) -not checking  Associated Symptoms- Polyuria/phagia/dipsia- no      Visual problems- no Medications: Compliance(Modifying factor) - twice a day metformin Hypoglycemic symptoms- no Timing - continuous  HYPERTENSION Disease Monitoring  Home BP Monitoring (Severity) not checking Symptoms - Chest pain- no    Dyspnea- no Medications (Modifying factors) Compliance-  daily. Lightheadedness-  no  Edema- no Timing - continuous  Duration - years ROS - See HPI  PMH Lab Review   Potassium  Date Value Ref Range Status  11/15/2015 4.3 3.5 - 5.3 mmol/L Final   Sodium  Date Value Ref Range Status  11/15/2015 136 135 - 146 mmol/L Final   Creat  Date Value Ref Range Status  11/15/2015 1.18 0.70 - 1.18 mg/dL Final    Comment:      For patients > or = 76 years of age: The upper reference limit for Creatinine is approximately 13% higher for people identified as African-American.          Monitoring Labs and Parameters Last A1C:  Lab Results  Component Value Date   HGBA1C 6.4 09/04/2016   Last Lipid:     Component Value Date/Time   CHOL 145 07/24/2016 1010   HDL 43 07/24/2016 1010   Last Bmet  Potassium  Date Value Ref Range Status  11/15/2015 4.3 3.5 - 5.3 mmol/L Final   Sodium  Date Value Ref Range Status  11/15/2015 136 135 - 146 mmol/L Final   Creat  Date Value Ref Range Status  11/15/2015  1.18 0.70 - 1.18 mg/dL Final    Comment:      For patients > or = 76 years of age: The upper reference limit for Creatinine is approximately 13% higher for people identified as African-American.               Chief Complaint noted Review of Symptoms - see HPI PMH - Smoking status noted.     Objective Vital Signs reviewed Alert nad Lungs:  Normal respiratory effort, chest expands symmetrically. Lungs are clear to auscultation, no crackles or wheezes. Heart - Regular rate and rhythm.  No murmurs, gallops or rubs.    Extremities:  No cyanosis, edema, or deformity noted with good range of motion of all major joints.   Skin:  Intact without suspicious lesions or rashes     Assessments/Plans  No problem-specific Assessment & Plan notes found for this encounter.   See Encounter view if individual problem A/Ps not visible See after visit summary for details of patient instuctions

## 2016-09-04 NOTE — Patient Instructions (Addendum)
Good to see you today!  Thanks for coming in.  For the neck pain -  use Tylenol arthritis every 8 hours as needed Use heat on it 3-4 times a day  For the cough  Take Loratadine 10 mg every day Should be better in 1-2 weeks If fever or shortness of breath then call me  Get the flu shot in September  Come back in December for blood tests

## 2016-09-04 NOTE — Assessment & Plan Note (Signed)
BP Readings from Last 3 Encounters:  09/04/16 130/78  07/24/16 130/60  05/15/16 132/62   Well controlled

## 2016-09-04 NOTE — Assessment & Plan Note (Signed)
Acute.  Seems most likely due to viral bronchitis or allergies with PND.  No signs of pneumonia or PE or bronchospasm.  Trial of antihistamine.  Has history of recurrent cough.  Last CT in spring 2018 when diagnosed as PE but no other findings

## 2016-09-04 NOTE — Assessment & Plan Note (Signed)
Well controlled 

## 2016-09-23 ENCOUNTER — Telehealth: Payer: Self-pay | Admitting: *Deleted

## 2016-09-23 NOTE — Telephone Encounter (Signed)
Patient left message on nurse line requesting a prescription for blood pressure machine. Please give him a call at [phone].  Derl Barrow, RN

## 2016-09-27 ENCOUNTER — Encounter: Payer: Self-pay | Admitting: Family Medicine

## 2016-09-27 NOTE — Progress Notes (Signed)
S 

## 2016-09-27 NOTE — Telephone Encounter (Signed)
Sent letter

## 2016-10-29 DIAGNOSIS — H353131 Nonexudative age-related macular degeneration, bilateral, early dry stage: Secondary | ICD-10-CM | POA: Diagnosis not present

## 2016-10-29 DIAGNOSIS — H26493 Other secondary cataract, bilateral: Secondary | ICD-10-CM | POA: Diagnosis not present

## 2016-10-29 DIAGNOSIS — E119 Type 2 diabetes mellitus without complications: Secondary | ICD-10-CM | POA: Diagnosis not present

## 2016-10-29 DIAGNOSIS — H52223 Regular astigmatism, bilateral: Secondary | ICD-10-CM | POA: Diagnosis not present

## 2016-10-29 DIAGNOSIS — H524 Presbyopia: Secondary | ICD-10-CM | POA: Diagnosis not present

## 2016-10-29 LAB — HM DIABETES EYE EXAM

## 2016-11-04 ENCOUNTER — Ambulatory Visit (INDEPENDENT_AMBULATORY_CARE_PROVIDER_SITE_OTHER): Payer: PPO | Admitting: *Deleted

## 2016-11-04 DIAGNOSIS — Z23 Encounter for immunization: Secondary | ICD-10-CM | POA: Diagnosis not present

## 2016-11-27 ENCOUNTER — Ambulatory Visit (INDEPENDENT_AMBULATORY_CARE_PROVIDER_SITE_OTHER): Payer: PPO | Admitting: Family Medicine

## 2016-11-27 ENCOUNTER — Encounter: Payer: Self-pay | Admitting: Family Medicine

## 2016-11-27 DIAGNOSIS — M153 Secondary multiple arthritis: Secondary | ICD-10-CM

## 2016-11-27 NOTE — Patient Instructions (Addendum)
Good to see you today!  Thanks for coming in.  For the Knee  Take arthritis Tylenol  1-2 twice a day as needed  Apercreme use three times a day a day as needed  Come back in February for a diabetes check

## 2016-11-27 NOTE — Progress Notes (Signed)
Subjective  Patient is presenting with the following illnesses  EXTREMITY PAIN  Location: Left knee Pain started: on and off for months Pain is: moderate worse with motion on some days Medications tried: none Recent trauma: no Similar pain previously: yes in other joints   Symptoms Redness:no Swelling:no Fever: no Weakness: no Weight loss: no Rash: no   Review of Symptoms - see HPI PMH - Smoking status noted.       Chief Complaint noted Review of Symptoms - see HPI PMH - Smoking status noted.     Objective Vital Signs reviewed  L Knee - FROM with crepitus.  No effusion or redness Good muscle tone    Assessments/Plans  Osteoarthritis Worsened in L knee.  See after visit summary.  Will treat with analgesics as needed.  If not improving consider xray or injection.  He is active and his weight is reasonable    See after visit summary for details of patient instuctions

## 2016-11-27 NOTE — Assessment & Plan Note (Signed)
Worsened in L knee.  See after visit summary.  Will treat with analgesics as needed.  If not improving consider xray or injection.  He is active and his weight is reasonable

## 2016-11-28 ENCOUNTER — Other Ambulatory Visit: Payer: Self-pay | Admitting: Family Medicine

## 2016-12-16 ENCOUNTER — Other Ambulatory Visit: Payer: Self-pay | Admitting: Family Medicine

## 2017-01-10 ENCOUNTER — Other Ambulatory Visit: Payer: Self-pay | Admitting: Student

## 2017-01-10 DIAGNOSIS — R42 Dizziness and giddiness: Secondary | ICD-10-CM

## 2017-01-15 ENCOUNTER — Ambulatory Visit: Payer: PPO | Admitting: Family Medicine

## 2017-01-28 ENCOUNTER — Other Ambulatory Visit: Payer: Self-pay | Admitting: Family Medicine

## 2017-01-29 ENCOUNTER — Other Ambulatory Visit: Payer: Self-pay

## 2017-01-29 ENCOUNTER — Encounter: Payer: Self-pay | Admitting: Family Medicine

## 2017-01-29 ENCOUNTER — Ambulatory Visit (INDEPENDENT_AMBULATORY_CARE_PROVIDER_SITE_OTHER): Payer: Medicare HMO | Admitting: Family Medicine

## 2017-01-29 VITALS — BP 130/72 | HR 75 | Temp 98.7°F | Wt 180.6 lb

## 2017-01-29 DIAGNOSIS — E1169 Type 2 diabetes mellitus with other specified complication: Secondary | ICD-10-CM

## 2017-01-29 DIAGNOSIS — M153 Secondary multiple arthritis: Secondary | ICD-10-CM | POA: Diagnosis not present

## 2017-01-29 DIAGNOSIS — E782 Mixed hyperlipidemia: Secondary | ICD-10-CM

## 2017-01-29 DIAGNOSIS — I10 Essential (primary) hypertension: Secondary | ICD-10-CM | POA: Diagnosis not present

## 2017-01-29 DIAGNOSIS — E538 Deficiency of other specified B group vitamins: Secondary | ICD-10-CM | POA: Diagnosis not present

## 2017-01-29 LAB — POCT GLYCOSYLATED HEMOGLOBIN (HGB A1C): HEMOGLOBIN A1C: 6.7

## 2017-01-29 MED ORDER — OMEPRAZOLE 20 MG PO CPDR: DELAYED_RELEASE_CAPSULE | ORAL | 2 refills | Status: DC

## 2017-01-29 NOTE — Assessment & Plan Note (Signed)
Well controlled 

## 2017-01-29 NOTE — Assessment & Plan Note (Signed)
Check labs Has been taking supplement

## 2017-01-29 NOTE — Patient Instructions (Addendum)
Good to see you today!  Thanks for coming in.  Your diabetes is doing great   Take Arthritis Tylenol as needed for the knee pain   Do some mild weight lifting for exercise  I will call you if your tests are not good.  Otherwise I will send you a letter.  If you do not hear from me with in 2 weeks please call our office.     Come back in 6 months

## 2017-01-29 NOTE — Progress Notes (Signed)
Subjective  Patient is presenting with the following illnesses  JOINT PAIN Location: both knees.   Course:  Had for years comes and goes Worse with:  walking Better with:  Rest and tylenol - he takes this only a few times a week Trauma: no Swelling:  no Locking:  no Other Joints involved:  no Rash: no Fever:  no   HYPERTENSION Disease Monitoring: Blood pressure range-not checking Chest pain, palpitations- no      Dyspnea- no Medications: Compliance- daily Lightheadedness,Syncope- no   Edema- no  DIABETES Disease Monitoring: Blood Sugar ranges-not checking Polyuria/phagia/dipsia- no      Visual problems- no Medications: Compliance- daily metformin Hypoglycemic symptoms- no  HYPERLIPIDEMIA Disease Monitoring: See symptoms for Hypertension Medications: Compliance- daily  Right upper quadrant pain- no  Muscle aches- no  Monitoring Labs and Parameters Last A1C:  Lab Results  Component Value Date   HGBA1C 6.7 01/29/2017    Last Lipid:     Component Value Date/Time   CHOL 145 07/24/2016 1010   HDL 43 07/24/2016 1010    Last Bmet  Potassium  Date Value Ref Range Status  11/15/2015 4.3 3.5 - 5.3 mmol/L Final   Sodium  Date Value Ref Range Status  11/15/2015 136 135 - 146 mmol/L Final   Creat  Date Value Ref Range Status  11/15/2015 1.18 0.70 - 1.18 mg/dL Final    Comment:      For patients > or = 77 years of age: The upper reference limit for Creatinine is approximately 13% higher for people identified as African-American.         Last BPs:  BP Readings from Last 3 Encounters:  01/29/17 130/72  11/27/16 130/68  09/04/16 130/78         Chief Complaint noted Review of Symptoms - see HPI PMH - Smoking status noted.     Objective Vital Signs reviewed Knees - bilateral crepitus with good range of motion no effusion   Assessments/Plans  Diabetes mellitus, type II Well controlled   Essential hypertension, benign BP Readings from Last 3  Encounters:  01/29/17 130/72  11/27/16 130/68  09/04/16 130/78   Well controlled   Hyperlipidemia Lab Results  Component Value Date   [place] 69 07/24/2016   Well controlled   Vitamin B12 deficiency Check labs Has been taking supplement   Osteoarthritis Particularly bad in his knees.  Encourage taking tylenol as needed up to three times a day and to continue moderate activity    HM - Sees Dr Posey Pronto for eye exams   See after visit summary for details of patient instuctions

## 2017-01-29 NOTE — Assessment & Plan Note (Signed)
Lab Results  Component Value Date   LDLCALC 69 07/24/2016   Well controlled

## 2017-01-29 NOTE — Assessment & Plan Note (Signed)
Particularly bad in his knees.  Encourage taking tylenol as needed up to three times a day and to continue moderate activity

## 2017-01-29 NOTE — Assessment & Plan Note (Signed)
BP Readings from Last 3 Encounters:  01/29/17 130/72  11/27/16 130/68  09/04/16 130/78   Well controlled

## 2017-01-30 LAB — CMP14+EGFR
ALT: 15 IU/L (ref 0–44)
AST: 13 IU/L (ref 0–40)
Albumin/Globulin Ratio: 1.3 (ref 1.2–2.2)
Albumin: 4 g/dL (ref 3.5–4.8)
Alkaline Phosphatase: 70 IU/L (ref 39–117)
BILIRUBIN TOTAL: 0.5 mg/dL (ref 0.0–1.2)
BUN/Creatinine Ratio: 16 (ref 10–24)
BUN: 18 mg/dL (ref 8–27)
CALCIUM: 9.9 mg/dL (ref 8.6–10.2)
CHLORIDE: 100 mmol/L (ref 96–106)
CO2: 19 mmol/L — ABNORMAL LOW (ref 20–29)
Creatinine, Ser: 1.14 mg/dL (ref 0.76–1.27)
GFR calc Af Amer: 72 mL/min/{1.73_m2} (ref >59)
GFR calc non Af Amer: 62 mL/min/{1.73_m2} (ref >59)
Globulin, Total: 3.1 g/dL (ref 1.5–4.5)
Glucose: 166 mg/dL — ABNORMAL HIGH (ref 65–99)
POTASSIUM: 4.3 mmol/L (ref 3.5–5.2)
Sodium: 140 mmol/L (ref 134–144)
Total Protein: 7.1 g/dL (ref 6.0–8.5)

## 2017-01-30 LAB — CBC
HEMOGLOBIN: 13.8 g/dL (ref 13.0–17.7)
Hematocrit: 41.5 % (ref 37.5–51.0)
MCH: 28.2 pg (ref 26.6–33.0)
MCHC: 33.3 g/dL (ref 31.5–35.7)
MCV: 85 fL (ref 79–97)
Platelets: 332 10*3/uL (ref 150–379)
RBC: 4.9 x10E6/uL (ref 4.14–5.80)
RDW: 12.7 % (ref 12.3–15.4)
WBC: 9.1 10*3/uL (ref 3.4–10.8)

## 2017-01-30 LAB — VITAMIN B12: VITAMIN B 12: 1265 pg/mL — AB (ref 232–1245)

## 2017-02-21 ENCOUNTER — Ambulatory Visit (INDEPENDENT_AMBULATORY_CARE_PROVIDER_SITE_OTHER): Payer: Medicare HMO | Admitting: Family Medicine

## 2017-02-21 ENCOUNTER — Other Ambulatory Visit: Payer: Self-pay

## 2017-02-21 VITALS — BP 142/60 | HR 69 | Temp 98.3°F | Ht 71.0 in | Wt 180.0 lb

## 2017-02-21 DIAGNOSIS — R058 Other specified cough: Secondary | ICD-10-CM

## 2017-02-21 DIAGNOSIS — R05 Cough: Secondary | ICD-10-CM | POA: Diagnosis not present

## 2017-02-21 MED ORDER — BENZONATATE 100 MG PO CAPS: ORAL_CAPSULE | ORAL | 0 refills | Status: DC

## 2017-02-21 NOTE — Assessment & Plan Note (Addendum)
Acute.  Likely viral URI.  Does have history of PE though well score 1.5.  No signs of hemoptysis, tachycardia, or hypoxia on presentation also making PE less likely.  Afebrile with clear lung sounds making pneumonia less likely. - Given Tessalon Perles for cough - Reviewed return precautions

## 2017-02-21 NOTE — Patient Instructions (Signed)
Thank you for coming in to see [place] today. Please see below to review our plan for today's visit.  I have sent in a prescription for the [facility].  You can take these twice daily as needed for cough.  If you continue having the cough for more than 2 weeks, please return to the clinic for evaluation.  Other reasons to return include fever (greater than 100.3 F), green yellow or bloody sputum production, shortness of breath, chest pain.  Please call the clinic at [phone] if your symptoms worsen or you have any concerns. It was our pleasure to serve you.  Harriet Butte, [place], PGY-2

## 2017-02-21 NOTE — Progress Notes (Signed)
   Subjective   Patient ID: Arthur [name]    DOB: [birth year], 77 y.o. male   MRN: [id number]  CC: "Cough"  HPI: Arthur [name] is a 77 y.o. male who presents for a same day appointment for the following:  URI  Has been sick for 2 days. Non-productive cough. Nasal discharge: No Medications tried: Tessalon 100 mg BID, pearls with relief, honey in tea too Sick contacts: No  Symptoms Fever: No Headache or face pain: No Tooth pain: No Sneezing: No Scratchy throat: No Allergies: No Muscle aches: No Severe fatigue: No Stiff neck: No Shortness of breath: No Rash: No Sore throat or swollen glands: No  Had PE 2017. [place] score 1.5 today.  ROS: see HPI for pertinent.  [place]: H/o submassive PE 2017 (completed Xarelto), DM, HTN, GERD, gout. Smoking status reviewed. Medications reviewed.  Objective   BP (!) 142/60 (BP Location: Left Arm, Patient Position: Sitting, Cuff Size: Normal)   Pulse 69   Temp 98.3 F (36.8 C) (Oral)   Ht 5\' 11"  (1.803 m)   Wt 180 lb (81.6 kg)   SpO2 97%   BMI 25.10 kg/m  Vitals and nursing note reviewed.  General: well nourished, well developed, NAD with non-toxic appearance HEENT: normocephalic, atraumatic, moist mucous membranes Cardiovascular: regular rate and rhythm without murmurs, rubs, or gallops Lungs: mild expiratory wheeze on right lung fields improved with cough with normal work of breathing on room air, no egophony, absent rales and rhonchi Skin: warm, dry, no rashes or lesions, cap refill < 2 seconds Extremities: warm and well perfused, normal tone, no edema  Assessment & Plan   Non-productive cough Acute.  Likely viral URI.  Does have history of PE though well score 1.5.  No signs of hemoptysis, tachycardia, or hypoxia on presentation also making PE less likely.  Afebrile with clear lung sounds making pneumonia less likely. - Given Tessalon Perles for cough - Reviewed return precautions  No orders of the defined types were  placed in this encounter.  Meds ordered this encounter  Medications  . benzonatate (TESSALON) 100 MG capsule    Sig: TAKE 1 CAPSULE BY MOUTH TWICE DAILY AS NEEDED FOR COUGH    Dispense:  20 capsule    Refill:  0    Please consider 90 day supplies to promote better adherence    Harriet Butte, [place], PGY-2 02/22/2017, 6:57 PM

## 2017-02-24 ENCOUNTER — Other Ambulatory Visit: Payer: Self-pay | Admitting: *Deleted

## 2017-02-24 DIAGNOSIS — R42 Dizziness and giddiness: Secondary | ICD-10-CM

## 2017-02-24 MED ORDER — MECLIZINE HCL 12.5 MG PO TABS: ORAL_TABLET | ORAL | 0 refills | Status: DC

## 2017-02-24 NOTE — Telephone Encounter (Signed)
Patient left message on nurse line requesting refill on "dizziness medication." Pt thought this was to be refilled at [facility] appt on 02/21/2017

## 2017-02-27 ENCOUNTER — Encounter: Payer: Self-pay | Admitting: Family Medicine

## 2017-03-05 ENCOUNTER — Ambulatory Visit (INDEPENDENT_AMBULATORY_CARE_PROVIDER_SITE_OTHER): Payer: Medicare HMO | Admitting: Family Medicine

## 2017-03-05 ENCOUNTER — Encounter: Payer: Self-pay | Admitting: Family Medicine

## 2017-03-05 ENCOUNTER — Ambulatory Visit: Payer: Medicare HMO | Admitting: Family Medicine

## 2017-03-05 DIAGNOSIS — M79671 Pain in right foot: Secondary | ICD-10-CM

## 2017-03-05 DIAGNOSIS — R05 Cough: Secondary | ICD-10-CM

## 2017-03-05 DIAGNOSIS — R058 Other specified cough: Secondary | ICD-10-CM

## 2017-03-05 NOTE — Assessment & Plan Note (Signed)
I think most consistent with DJD perhaps overuse. Doubt gout given the mild nature.  No signs of infection

## 2017-03-05 NOTE — Assessment & Plan Note (Signed)
Improved.  Cautioned could take several more weeks to fully resolve

## 2017-03-05 NOTE — Patient Instructions (Signed)
Good to see you today!  Thanks for coming in.  For the foot I think it is arthritis.  Take Tylenol arthritis as needed for pain.  If not getting better in 1-2 weeks or if gets much worse call me  Go back to work on Friday  Take the tessalon as needed for cough  Come back in 5 months

## 2017-03-05 NOTE — Progress Notes (Signed)
Subjective  Patient is presenting with the following illnesses  RIGHT FOOT PAIN  Pain is: achy mild - Is able to walk Location: distal mid foot Pain started: 2 days ago slowly Pain prevents: nothing Medications tried: tylenol Recent trauma: no Similar pain previously: no  Symptoms Redness:mild Swelling:mild Fever: no Weakness: no Weight loss: no Rash: no   Review of Symptoms - see HPI PMH - Smoking status noted.    COUGH Is improving since seen on 1/25.  Feels he is able to go back to work.  No fever or sputum or leg swelling or shortness of breath   Chief Complaint noted Review of Symptoms - see HPI PMH - Smoking status noted.     Objective Vital Signs reviewed Lungs:  Normal respiratory effort, chest expands symmetrically. Lungs are clear to auscultation, no crackles or wheezes. Foot - R mildly tender to palpation with mild diffuse soft tissue swelling and slight redness compared to L.  Able to walk without problems No calf pain or edema   Assessments/Plans  Non-productive cough Improved.  Cautioned could take several more weeks to fully resolve   Right foot pain I think most consistent with DJD perhaps overuse. Doubt gout given the mild nature.  No signs of infection     See after visit summary for details of patient instuctions

## 2017-03-21 DIAGNOSIS — E119 Type 2 diabetes mellitus without complications: Secondary | ICD-10-CM | POA: Diagnosis not present

## 2017-03-21 DIAGNOSIS — M25561 Pain in right knee: Secondary | ICD-10-CM | POA: Diagnosis not present

## 2017-03-21 DIAGNOSIS — M129 Arthropathy, unspecified: Secondary | ICD-10-CM | POA: Diagnosis not present

## 2017-03-21 DIAGNOSIS — M21611 Bunion of right foot: Secondary | ICD-10-CM | POA: Diagnosis not present

## 2017-05-05 ENCOUNTER — Other Ambulatory Visit: Payer: Self-pay | Admitting: Family Medicine

## 2017-05-07 ENCOUNTER — Encounter (HOSPITAL_COMMUNITY): Payer: Self-pay

## 2017-05-07 ENCOUNTER — Emergency Department (HOSPITAL_COMMUNITY)
Admission: EM | Admit: 2017-05-07 | Discharge: 2017-05-07 | Disposition: A | Discharge status: Home / Self Care | Payer: Medicare HMO | Attending: Emergency Medicine | Admitting: Emergency Medicine

## 2017-05-07 ENCOUNTER — Emergency Department (HOSPITAL_COMMUNITY): Payer: Medicare HMO

## 2017-05-07 DIAGNOSIS — Z7982 Long term (current) use of aspirin: Secondary | ICD-10-CM | POA: Insufficient documentation

## 2017-05-07 DIAGNOSIS — Z7984 Long term (current) use of oral hypoglycemic drugs: Secondary | ICD-10-CM | POA: Insufficient documentation

## 2017-05-07 DIAGNOSIS — E119 Type 2 diabetes mellitus without complications: Secondary | ICD-10-CM | POA: Insufficient documentation

## 2017-05-07 DIAGNOSIS — Z79899 Other long term (current) drug therapy: Secondary | ICD-10-CM | POA: Insufficient documentation

## 2017-05-07 DIAGNOSIS — M545 Low back pain, unspecified: Secondary | ICD-10-CM

## 2017-05-07 DIAGNOSIS — I10 Essential (primary) hypertension: Secondary | ICD-10-CM | POA: Diagnosis not present

## 2017-05-07 DIAGNOSIS — M5489 Other dorsalgia: Secondary | ICD-10-CM | POA: Diagnosis not present

## 2017-05-07 DIAGNOSIS — M546 Pain in thoracic spine: Secondary | ICD-10-CM | POA: Diagnosis not present

## 2017-05-07 LAB — CBC
HEMATOCRIT: 42.1 % (ref 39.0–52.0)
HEMOGLOBIN: 13.7 g/dL (ref 13.0–17.0)
MCH: 28.7 pg (ref 26.0–34.0)
MCHC: 32.5 g/dL (ref 30.0–36.0)
MCV: 88.1 fL (ref 78.0–100.0)
Platelets: 277 10*3/uL (ref 150–400)
RBC: 4.78 MIL/uL (ref 4.22–5.81)
RDW: 13.9 % (ref 11.5–15.5)
WBC: 10.9 10*3/uL — ABNORMAL HIGH (ref 4.0–10.5)

## 2017-05-07 LAB — BASIC METABOLIC PANEL
ANION GAP: 11 (ref 5–15)
BUN: 25 mg/dL — ABNORMAL HIGH (ref 6–20)
CHLORIDE: 106 mmol/L (ref 101–111)
CO2: 22 mmol/L (ref 22–32)
Calcium: 9.6 mg/dL (ref 8.9–10.3)
Creatinine, Ser: 1.33 mg/dL — ABNORMAL HIGH (ref 0.61–1.24)
GFR calc non Af Amer: 50 mL/min — ABNORMAL LOW (ref >60)
GFR, EST AFRICAN AMERICAN: 58 mL/min — AB (ref >60)
GLUCOSE: 150 mg/dL — AB (ref 65–99)
POTASSIUM: 4.6 mmol/L (ref 3.5–5.1)
Sodium: 139 mmol/L (ref 135–145)

## 2017-05-07 LAB — URINALYSIS, ROUTINE W REFLEX MICROSCOPIC
Bilirubin Urine: NEGATIVE
Glucose, UA: NEGATIVE mg/dL
HGB URINE DIPSTICK: NEGATIVE
Ketones, ur: NEGATIVE mg/dL
Leukocytes, UA: NEGATIVE
NITRITE: NEGATIVE
PH: 5 (ref 5.0–8.0)
Protein, ur: NEGATIVE mg/dL
SPECIFIC GRAVITY, URINE: 1.01 (ref 1.005–1.030)

## 2017-05-07 IMAGING — CR DG THORACIC SPINE 2V
3 series · 3 of 3 positions shown · non-contrast
Comparison: CT, [DATE]

CLINICAL DATA: Back pain.

EXAM:
THORACIC SPINE 2 VIEWS

[t thoracic spine lat]
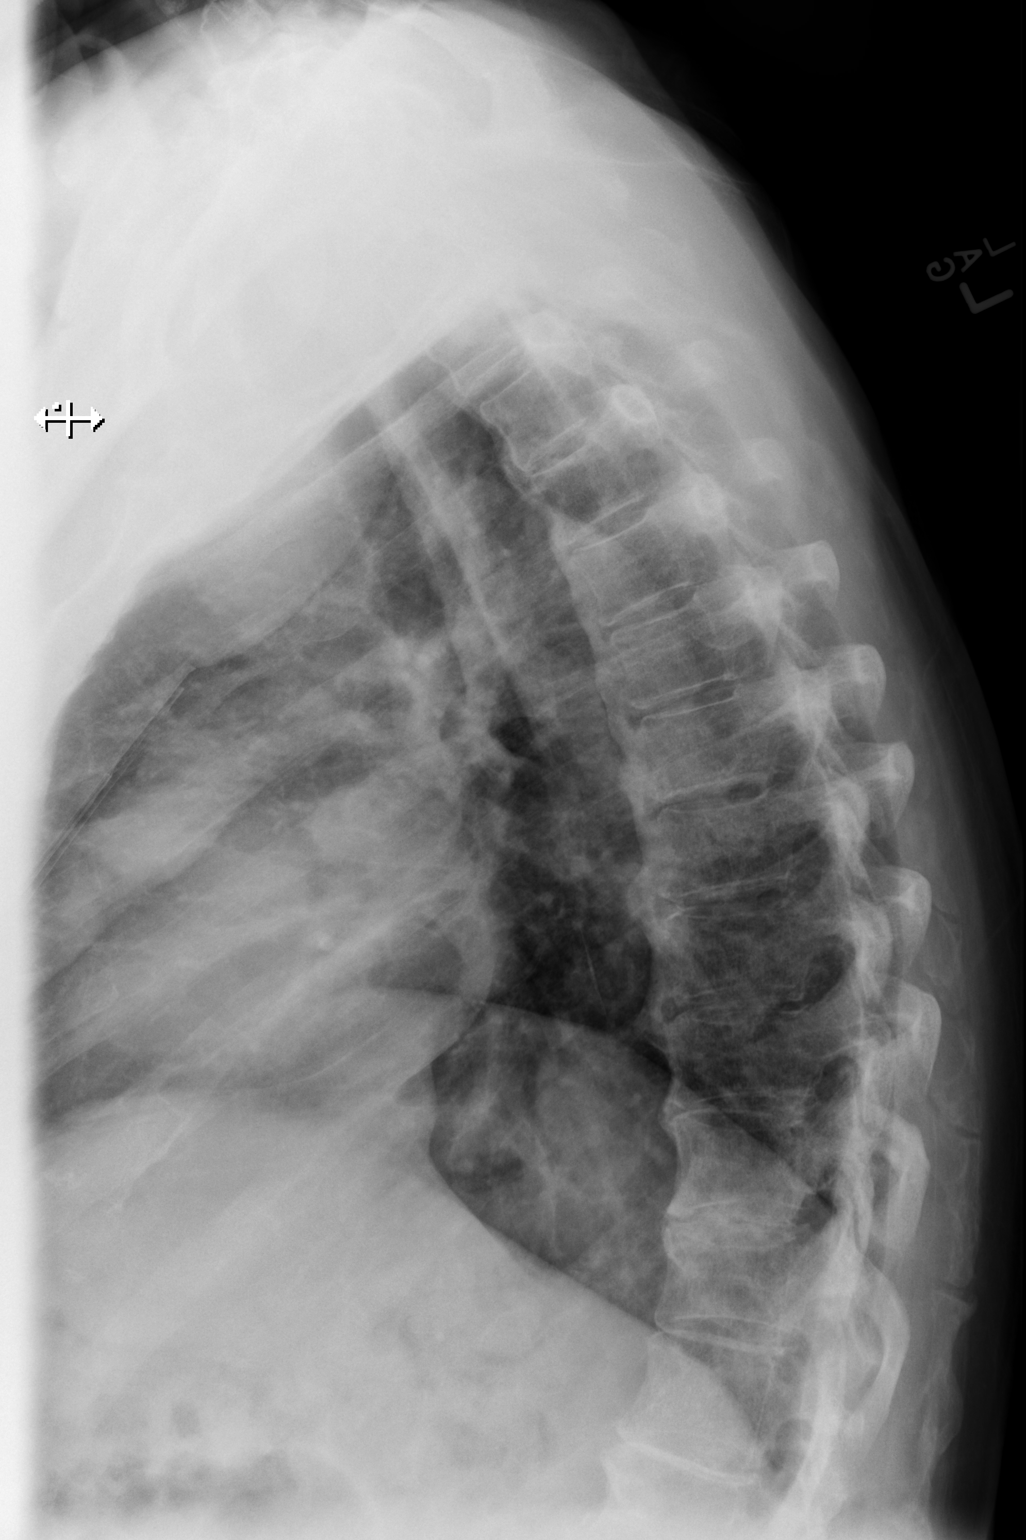

[t thoracic swimmers]
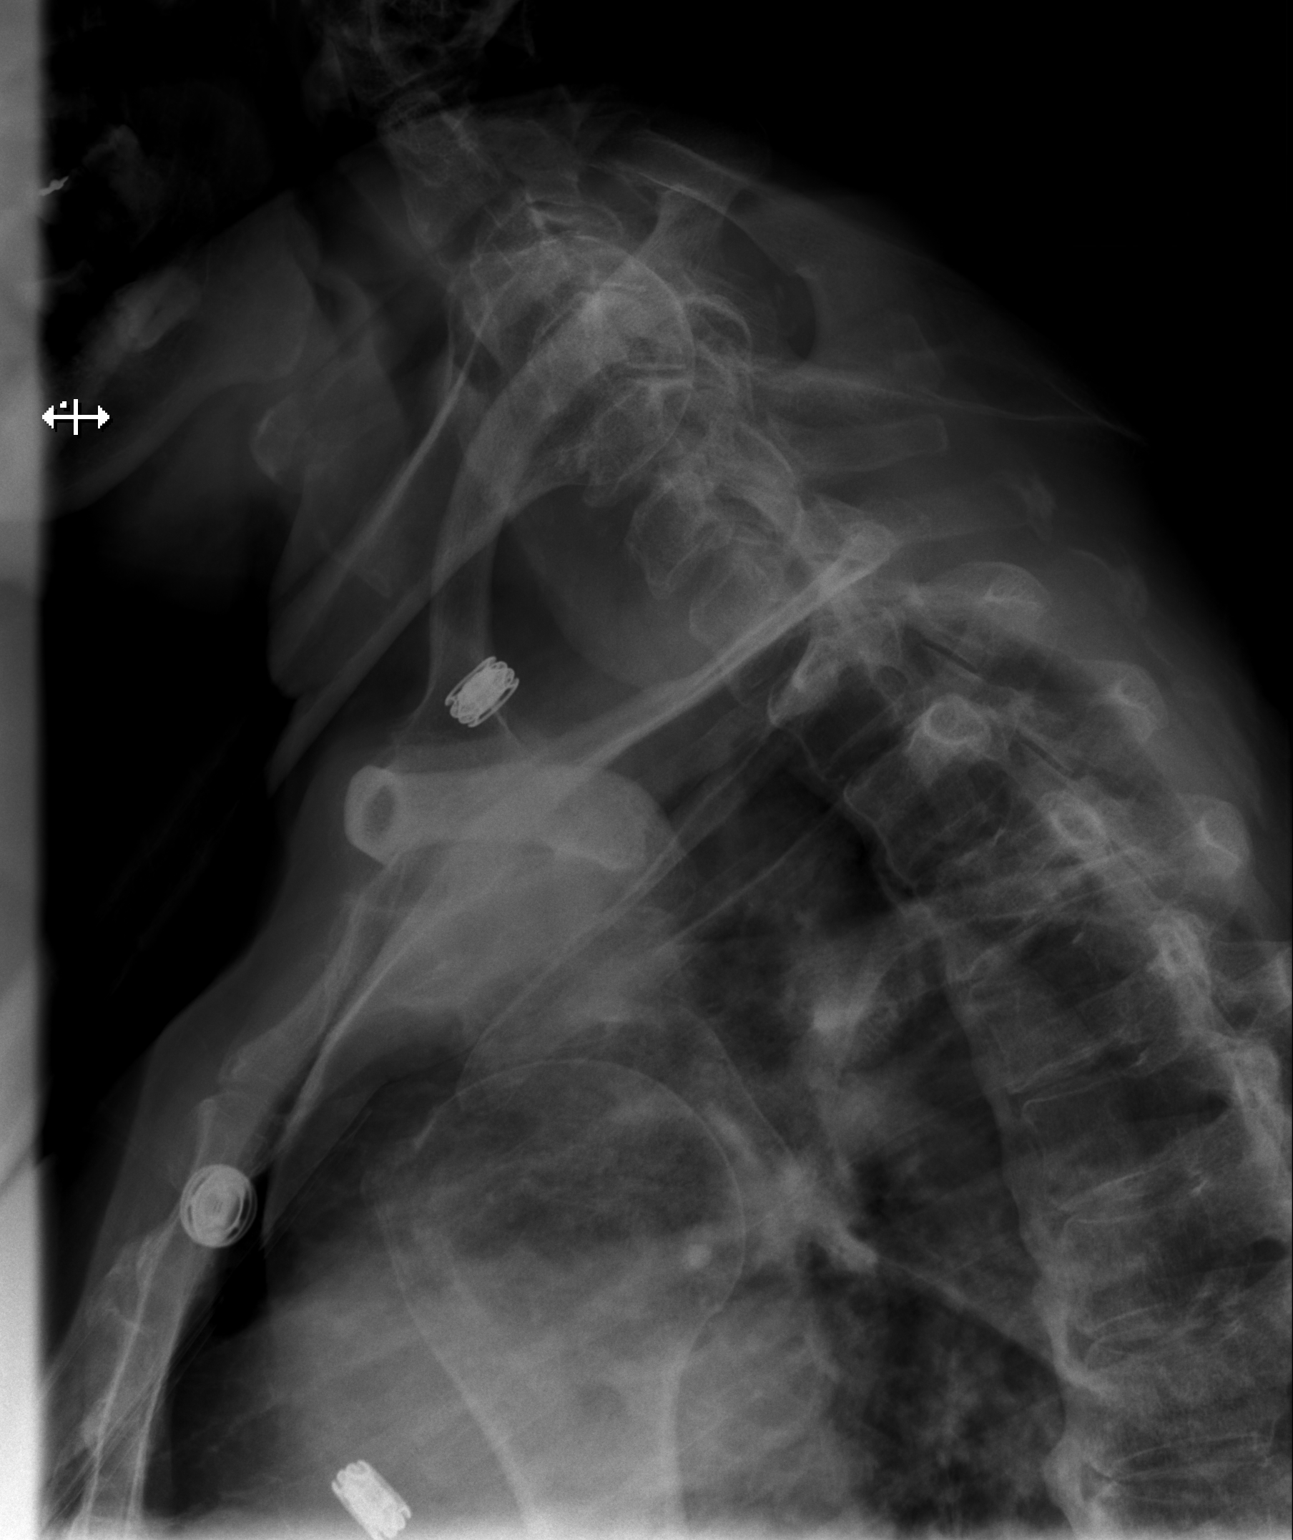

[t thoracic spine ap]
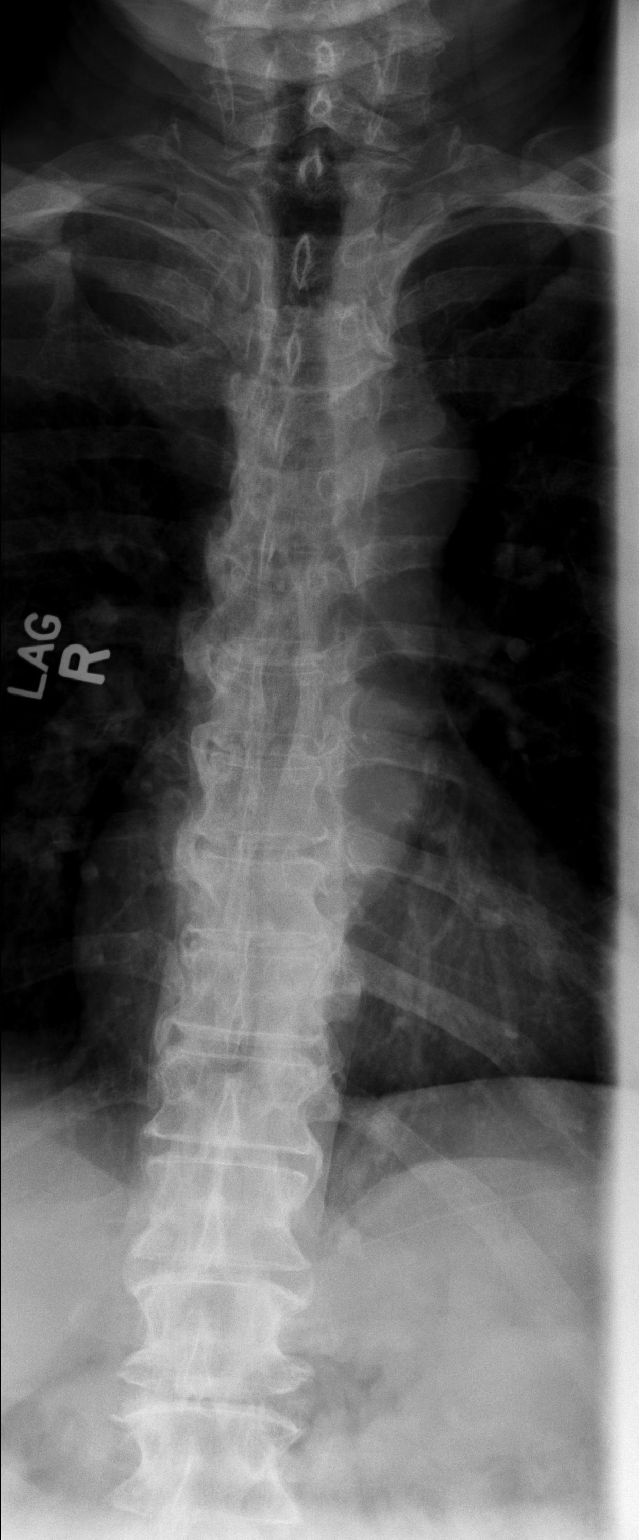

[3 of 3 positions shown; findings below may reference images not displayed]

FINDINGS: No fracture.  No bone lesion.  No spondylolisthesis.

There are bridging anterior osteophytes extending throughout the
thoracic spine. Mild loss of disc height in the lower thoracic
spine. No other degenerative changes.

Soft tissues are unremarkable.
IMPRESSION: 1. No fracture, spondylolisthesis or acute finding.
2. Disc degenerative changes and anterior bridging osteophytes,
stable compared to the prior chest CTA.

## 2017-05-07 IMAGING — CR DG LUMBAR SPINE COMPLETE 4+V
6 series · 6 of 6 positions shown · non-contrast
Comparison: None.

CLINICAL DATA: Patient was getting up from a chair about [NO] this
pm, states his back began to hurt, pain low back that radiates to
upper back, very limited movement

EXAM:
LUMBAR SPINE - COMPLETE 4+ VIEW

[t lumbar spine lat]
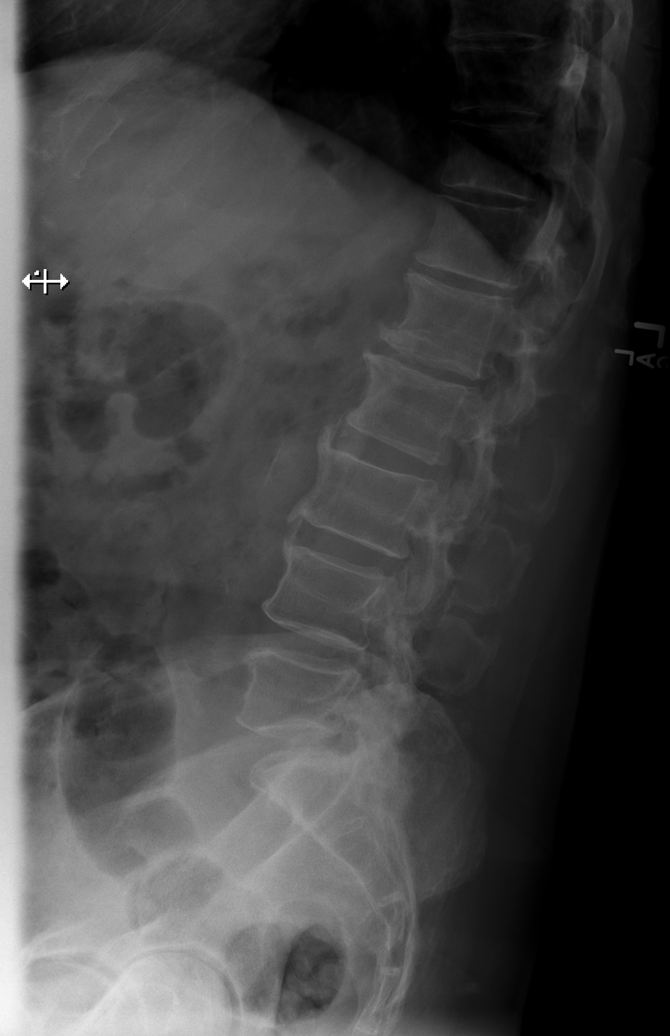

[t lumbar l-5 s-1 spot]
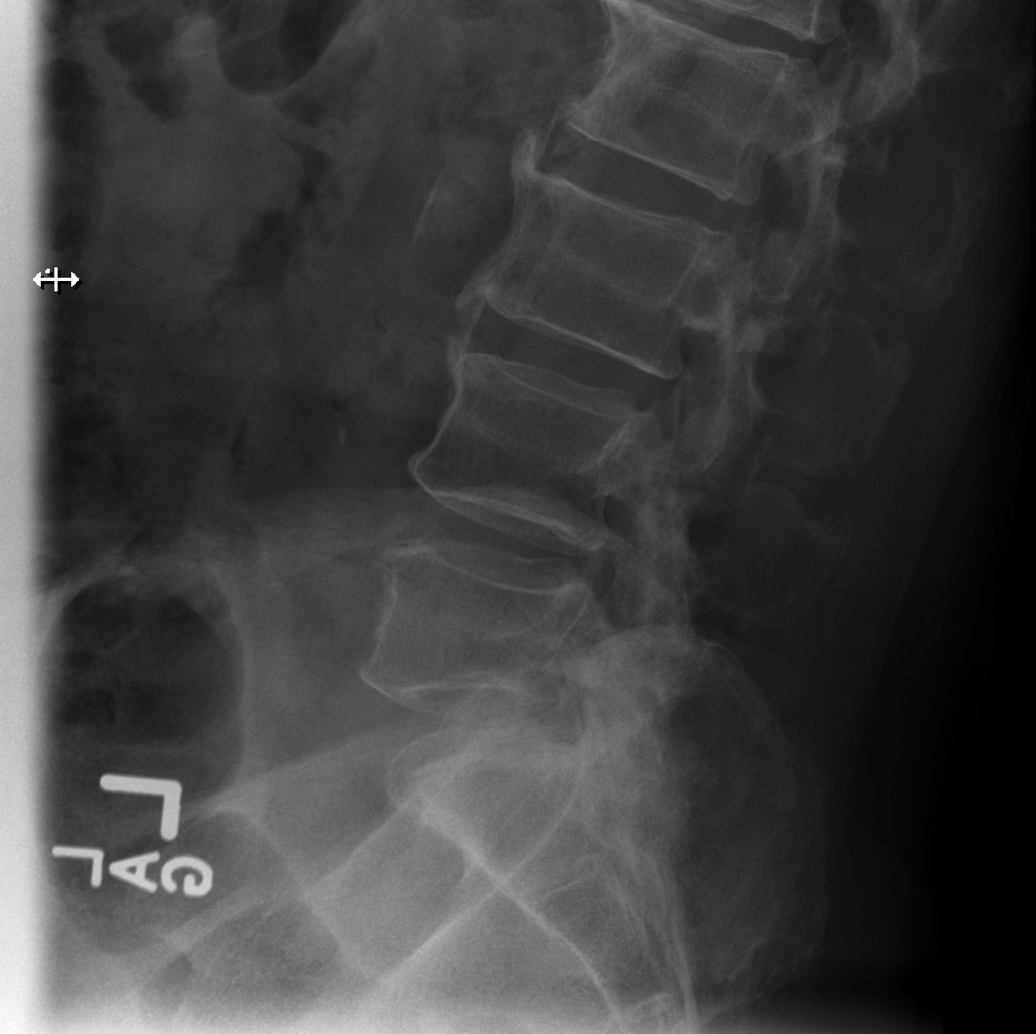

[t lumbar spine obl (1 of 3)]
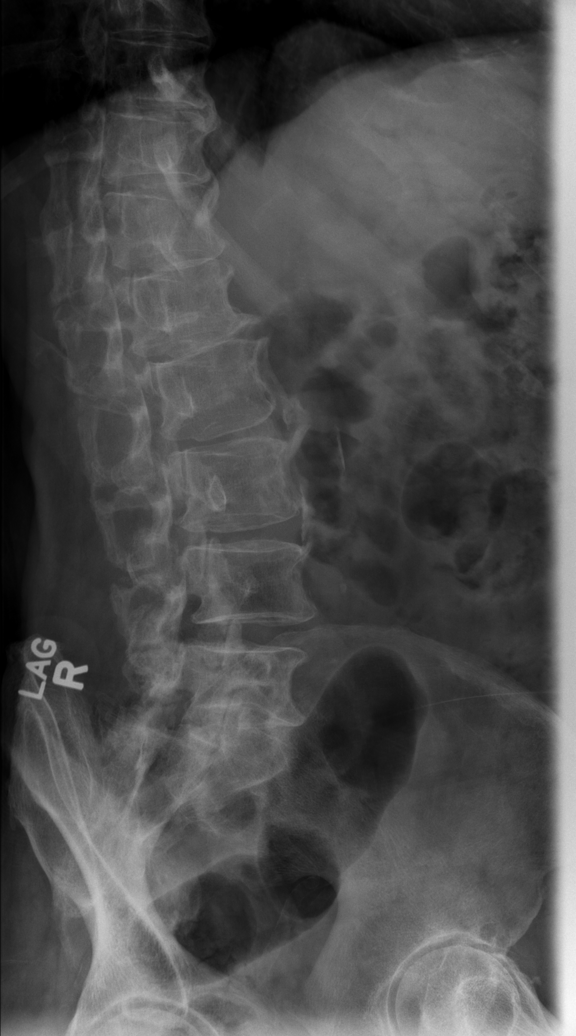

[t lumbar spine ap]
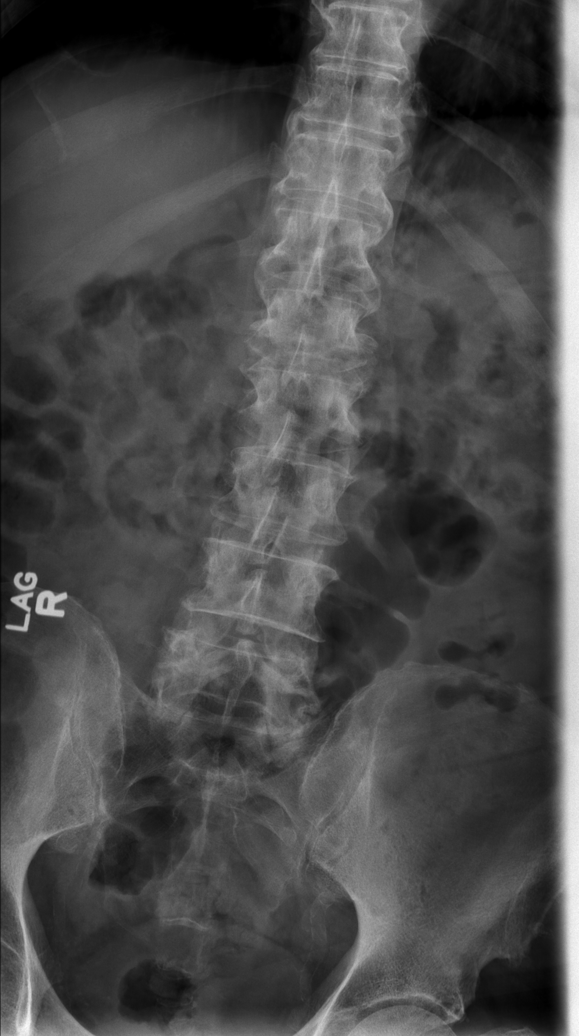

[t lumbar spine obl (2 of 3)]
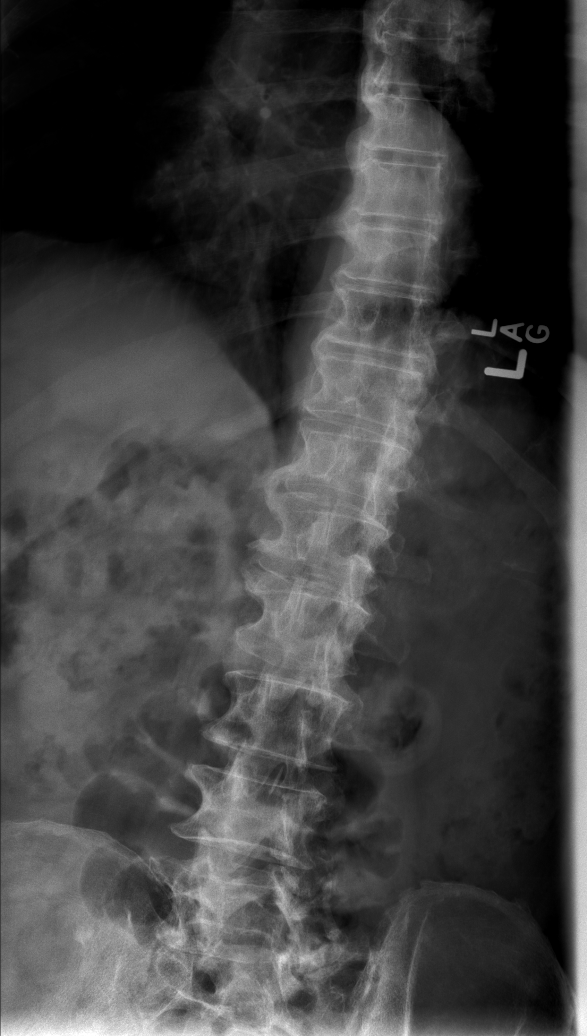

[t lumbar spine obl (3 of 3)]
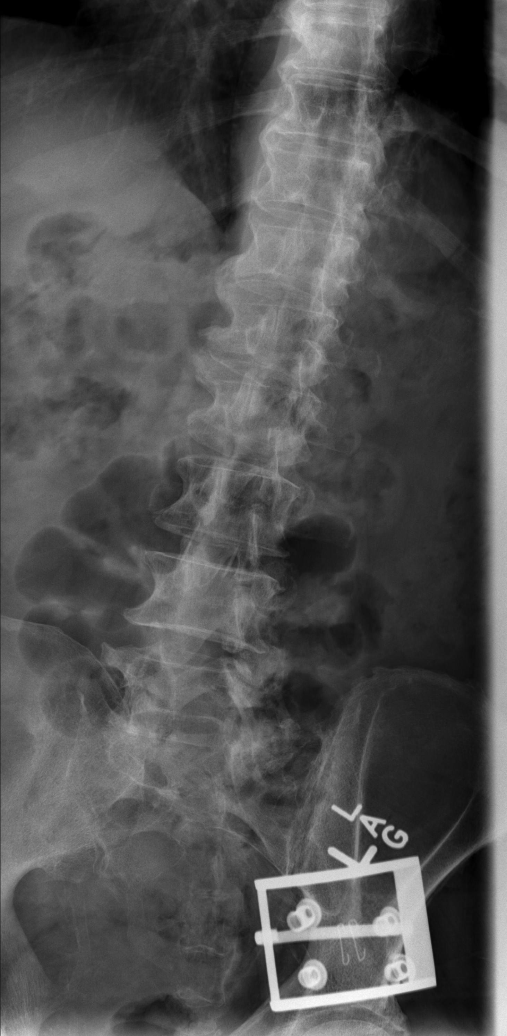

[6 of 6 positions shown; findings below may reference images not displayed]

FINDINGS: No acute fracture. There is a grade 1 anterolisthesis of L5 on S1.
No other spondylolisthesis. Mild generalized straightening of the
normal lumbar lordosis. No bone lesions.

Mild loss of disc height at T12-L1 and L1-L2. Remaining lumbar discs
are relatively well preserved in height. Osteophytes at all lumbar
levels.

There facet degenerative changes at L5-S1.

Bones are demineralized.

There are scattered aortic vascular calcifications. Soft tissues are
otherwise unremarkable.
IMPRESSION: 1. No fracture or acute finding.
2. Degenerative changes as detailed.  Diffuse bony demineralization.

## 2017-05-07 MED ORDER — METHOCARBAMOL 500 MG PO TABS
500.0000 mg | ORAL_TABLET | Freq: Once | ORAL | Status: AC
  Administered 2017-05-07: 500 mg via ORAL
  Filled 2017-05-07: qty 1

## 2017-05-07 MED ORDER — SODIUM CHLORIDE 0.9 % IV BOLUS
1000.0000 mL | Freq: Once | INTRAVENOUS | Status: AC
  Administered 2017-05-07: 1000 mL via INTRAVENOUS

## 2017-05-07 MED ORDER — METHOCARBAMOL 500 MG PO TABS: 500.0000 mg | ORAL_TABLET | Freq: Two times a day (BID) | ORAL | 0 refills | Status: DC

## 2017-05-07 NOTE — ED Notes (Signed)
Bed: WK08 Expected date:  Expected time:  Means of arrival:  Comments: Back pain

## 2017-05-07 NOTE — ED Provider Notes (Signed)
[facility] DEPT Provider Note   CSN: [id number] Arrival date & time: 05/07/17  1931     History   Chief Complaint Chief Complaint  Patient presents with  . Back Pain    HPI Arthur [name] is a 77 y.o. male.  Arthur [name] is a 77 y.o. Male with a history of diabetes, hypertension and hyperlipidemia, who presents to the ED for evaluation of left-sided lower back pain that started around 630 this evening.  Patient reports he went to stand up abruptly from the kitchen table and noticed a pain in his back.  This pain has been intermittent, but is most definitely worse with movement and walking.  Patient denies radiation of pain anywhere, no numbness or tingling down in the legs, no weakness, no loss of bowel or bladder control or saddle anesthesia.  Patient denies fevers or chills, no associated abdominal pain, chest pain or shortness of breath.  He denies any urinary symptoms.  He reports history of prior rib fractures on this side, reports pain is lower and feels different.  He took a dose of Tylenol just prior to arrival has not tried anything else to treat his symptoms.  Denies prior episodes of similar pain.     Past Medical History:  Diagnosis Date  . Diabetes mellitus without complication ([place])   . High cholesterol   . Hypertension   . Normal nuclear stress test 2010   stress perfusion study apparently in 2010 in [facility] which he said was negative.    Patient Active Problem List   Diagnosis Date Noted  . Right foot pain 03/05/2017  . Non-productive cough 06/08/2015  . Pulmonary embolism ([place])   . GERD (gastroesophageal reflux disease) 07/05/2013  . Osteoarthritis 02/26/2010  . GOUT, UNSPECIFIED 11/06/2009  . Diabetes mellitus, type II ([place]) 09/22/2009  . Hyperlipidemia 09/22/2009  . OBESITY, UNSPECIFIED 09/22/2009  . Essential hypertension, benign 09/22/2009    Past Surgical History:  Procedure Laterality Date  .  CATARACT EXTRACTION, BILATERAL  2006  . KNEE SURGERY  2005        Home Medications    Prior to Admission medications   Medication Sig Start Date End Date Taking? Authorizing Provider  acetaminophen (TYLENOL) 500 MG tablet Take 500 mg by mouth every 6 (six) hours as needed for mild pain.   Yes [provider]  amLODipine (NORVASC) 10 MG tablet Take 10 mg by mouth daily.  06/17/16  Yes [provider]  aspirin EC 81 MG tablet Take 81 mg by mouth daily.   Yes [provider]  atenolol (TENORMIN) 50 MG tablet TAKE ONE TABLET BY MOUTH TWICE DAILY 06/17/16  Yes Chambliss, Jeb Levering, MD  hydrochlorothiazide (HYDRODIURIL) 25 MG tablet TAKE ONE TABLET BY MOUTH ONCE DAILY 06/17/16  Yes Chambliss, Jeb Levering, MD  loratadine (CLARITIN) 10 MG tablet Take 1 tablet (10 mg total) by mouth daily. 09/04/16  Yes Lind Covert, MD  losartan (COZAAR) 100 MG tablet TAKE 1 TABLET BY MOUTH ONCE DAILY 05/05/17  Yes Chambliss, Jeb Levering, MD  metFORMIN (GLUCOPHAGE) 1000 MG tablet TAKE ONE TABLET BY MOUTH TWICE DAILY WITH A MEAL 06/17/16  Yes Chambliss, Jeb Levering, MD  omeprazole (PRILOSEC) 20 MG capsule TAKE 1 CAPSULE BY MOUTH ONCE DAILY AS NEEDED 01/29/17  Yes Chambliss, Jeb Levering, MD  pravastatin (PRAVACHOL) 40 MG tablet TAKE 1 TABLET BY MOUTH ONCE DAILY 01/29/17  Yes Chambliss, Jeb Levering, MD  benzonatate (TESSALON) 100 MG capsule TAKE 1  CAPSULE BY MOUTH TWICE DAILY AS NEEDED FOR COUGH Patient not taking: Reported on 05/07/2017 05/06/17   Lind Covert, MD  meclizine (ANTIVERT) 12.5 MG tablet TAKE ONE TABLET BY MOUTH TWICE DAILY AS NEEDED FOR DIZZINESS Patient not taking: Reported on 03/05/2017 02/24/17   Lind Covert, MD    Family History Family History  Problem Relation Age of Onset  . Coronary artery disease Father   . Coronary artery disease Mother     Social History Social History   Tobacco Use  . Smoking status: Never Smoker  . Smokeless tobacco: Never Used    Substance Use Topics  . Alcohol use: No  . Drug use: No     Allergies   Ace inhibitors   Review of Systems Review of Systems  Constitutional: Negative for chills and fever.  HENT: Negative for congestion, postnasal drip and sore throat.   Eyes: Negative for visual disturbance.  Respiratory: Negative for cough and shortness of breath.   Cardiovascular: Negative for chest pain and leg swelling.  Gastrointestinal: Negative for abdominal pain, constipation, diarrhea, nausea and vomiting.  Genitourinary: Negative for dysuria, flank pain and hematuria.  Musculoskeletal: Positive for back pain. Negative for arthralgias, joint swelling, neck pain and neck stiffness.  Skin: Negative for color change and rash.  Neurological: Negative for dizziness, facial asymmetry, weakness, light-headedness and numbness.     Physical Exam Updated Vital Signs BP 136/71 (BP Location: Left Arm)   Pulse 78   Temp 98.3 F (36.8 C) (Oral)   Resp 18   Ht 5\' 10"  (1.778 m)   Wt 82.6 kg (182 lb)   SpO2 100%   BMI 26.11 kg/m   Physical Exam  Constitutional: He appears well-developed and well-nourished. No distress.  HENT:  Head: Normocephalic and atraumatic.  Mouth/Throat: Oropharynx is clear and moist.  Eyes: Pupils are equal, round, and reactive to light. EOM are normal. Right eye exhibits no discharge. Left eye exhibits no discharge.  No nystagmus  Neck: Normal range of motion. Neck supple.  C-spine nontender to palpation at midline or paraspinally, full active range of motion of the neck  Cardiovascular: Normal rate, regular rhythm, normal heart sounds and intact distal pulses.  Pulmonary/Chest: Effort normal and breath sounds normal. No stridor. No respiratory distress. He has no wheezes. He has no rales.  Respirations equal and unlabored, patient able to speak in full sentences, lungs clear to auscultation bilaterally  Abdominal: Soft. Bowel sounds are normal. He exhibits no distension and no  mass. There is no tenderness. There is no rebound and no guarding.  Abdomen soft, nondistended, bowel sounds present throughout, nontender to palpation in all quadrants without peritoneal signs, no pulsatile abdominal masses noted.  Musculoskeletal: He exhibits no edema or deformity.  No midline tenderness of the thoracic or lumbar spine, there is mild tenderness over the musculature of the left side of the lower back, no overlying erythema, rashes or warmth, no palpable deformity.  Neurological: He is alert. Coordination normal.  Speech is clear, able to follow commands CN III-XII intact Normal strength in upper and lower extremities bilaterally including dorsiflexion and plantar flexion, strong and equal grip strength 2+ DTRs bilaterally in lower extremities. Sensation normal to light and sharp touch Moves extremities without ataxia, coordination intact  Skin: Skin is warm and dry. He is not diaphoretic.  Psychiatric: He has a normal mood and affect. His behavior is normal.  Nursing note and vitals reviewed.    ED Treatments / Results  Labs (  all labs ordered are listed, but only abnormal results are displayed) Labs Reviewed  BASIC METABOLIC PANEL - Abnormal; Notable for the following components:      Result Value   Glucose, Bld 150 (*)    BUN 25 (*)    Creatinine, Ser 1.33 (*)    GFR calc non Af Amer 50 (*)    GFR calc Af Amer 58 (*)    All other components within normal limits  CBC - Abnormal; Notable for the following components:   WBC 10.9 (*)    All other components within normal limits  URINALYSIS, ROUTINE W REFLEX MICROSCOPIC    EKG None  Radiology Dg Thoracic Spine 2 View  Result Date: 05/07/2017 CLINICAL DATA:  Back pain. EXAM: THORACIC SPINE 2 VIEWS COMPARISON:  CT, 05/18/2015 FINDINGS: No fracture.  No bone lesion.  No spondylolisthesis. There are bridging anterior osteophytes extending throughout the thoracic spine. Mild loss of disc height in the lower thoracic  spine. No other degenerative changes. Soft tissues are unremarkable. IMPRESSION: 1. No fracture, spondylolisthesis or acute finding. 2. Disc degenerative changes and anterior bridging osteophytes, stable compared to the prior chest CTA. Electronically Signed   By: Lajean Manes M.D.   On: 05/07/2017 20:51   Dg Lumbar Spine Complete  Result Date: 05/07/2017 CLINICAL DATA:  Patient was getting up from a chair about 1830 this pm, states his back began to hurt, pain low back that radiates to upper back, very limited movement EXAM: LUMBAR SPINE - COMPLETE 4+ VIEW COMPARISON:  None. FINDINGS: No acute fracture. There is a grade 1 anterolisthesis of L5 on S1. No other spondylolisthesis. Mild generalized straightening of the normal lumbar lordosis. No bone lesions. Mild loss of disc height at T12-L1 and L1-L2. Remaining lumbar discs are relatively well preserved in height. Osteophytes at all lumbar levels. There facet degenerative changes at L5-S1. Bones are demineralized. There are scattered aortic vascular calcifications. Soft tissues are otherwise unremarkable. IMPRESSION: 1. No fracture or acute finding. 2. Degenerative changes as detailed.  Diffuse bony demineralization. Electronically Signed   By: Lajean Manes M.D.   On: 05/07/2017 20:50    Procedures Procedures (including critical care time)  Medications Ordered in ED Medications  methocarbamol (ROBAXIN) tablet 500 mg (500 mg Oral Given 05/07/17 2051)  sodium chloride 0.9 % bolus 1,000 mL (1,000 mLs Intravenous New Bag/Given 05/07/17 2150)     Initial Impression / Assessment and Plan / ED Course  I have reviewed the triage vital signs and the nursing notes.  Pertinent labs & imaging results that were available during my care of the patient were reviewed by me and considered in my medical decision making (see chart for details).  Presents to the ED for evaluation of acute onset left-sided back pain after standing up abruptly from the dinner table.   No associated abdominal pain, urinary symptoms, fevers or chills.  No chest pain or shortness of breath.  No associated numbness, weakness or tingling in the lower extremities.  On initial evaluation vitals normal and patient is overall well-appearing, abdomen benign, there is no midline spinal tenderness and normal neurologic exam, mild tenderness over the left lower back musculature without deformity.  Given patient's age will get urinalysis and basic labs as well as plain films of the lumbar and thoracic spine.  Patient took Tylenol just prior to arrival will give Robaxin in addition.  Mild leukocytosis of 10.9, normal hemoglobin, creatinine and BUN are slightly elevated at 1.33 and 25 suggesting mild dehydration,  will give a liter of fluids, no other electrolyte derangements requiring intervention.  UA without any signs of infection, no protein, no blood.  X-rays of lumbar and thoracic spine show chronic degenerative changes but no acute fracture or malalignment.  Patient ambulatory in the department, reports improvement in pain with Robaxin.  At this time patient is stable for discharge home.  Provided short prescription for Robaxin, counseled patient on cautions with drowsiness and not to use before work or driving.  Patient to follow-up with his primary care doctor in the next few days.  Strict return precautions discussed.  Patient and family members expressed understanding and are in agreement with plan.  Pt discussed with Dr. Leonette Monarch who is in agreement with plan.  Final Clinical Impressions(s) / ED Diagnoses   Final diagnoses:  Acute left-sided low back pain without sciatica    ED Discharge Orders        Ordered    methocarbamol (ROBAXIN) 500 MG tablet  2 times daily     05/07/17 2312       Jacqlyn Larsen, [place] 05/07/17 2333    Fatima Blank, MD 05/08/17 [phone]

## 2017-05-07 NOTE — ED Triage Notes (Addendum)
Patient BIB EMS for left sided back pain that started about 1830. Pain increased with movement and stable at rest. Patient states he was sitting when it started.

## 2017-05-07 NOTE — Discharge Instructions (Addendum)
Your workup today is very reassuring no evidence of urinary tract infection and lab work overall looks good, x-ray show no evidence of acute fractures, just some chronic degenerative changes.  Please continue to use Robaxin and Tylenol as well as ice and heat.  Please follow-up closely with your primary care doctor.  Return to the emergency department if you have numbness, weakness, loss of bowel or bladder control, abdominal pain, fevers or chills or any other new or concerning symptoms.

## 2017-05-07 NOTE — ED Notes (Signed)
Pt. Ambulated in the room without difficulty. Pt. Gait steady on his feet. Family at bedside.

## 2017-05-21 ENCOUNTER — Ambulatory Visit: Payer: Medicare HMO | Admitting: Family Medicine

## 2017-05-27 ENCOUNTER — Other Ambulatory Visit: Payer: Self-pay

## 2017-05-27 ENCOUNTER — Ambulatory Visit (INDEPENDENT_AMBULATORY_CARE_PROVIDER_SITE_OTHER): Payer: Medicare HMO | Admitting: Family Medicine

## 2017-05-27 ENCOUNTER — Encounter: Payer: Self-pay | Admitting: Family Medicine

## 2017-05-27 VITALS — BP 130/78 | HR 75 | Temp 98.7°F | Wt 182.0 lb

## 2017-05-27 DIAGNOSIS — M79671 Pain in right foot: Secondary | ICD-10-CM

## 2017-05-27 DIAGNOSIS — E1143 Type 2 diabetes mellitus with diabetic autonomic (poly)neuropathy: Secondary | ICD-10-CM | POA: Diagnosis not present

## 2017-05-27 LAB — POCT GLYCOSYLATED HEMOGLOBIN (HGB A1C): Hemoglobin A1C: 6.4

## 2017-05-27 NOTE — Assessment & Plan Note (Signed)
Well controlled 

## 2017-05-27 NOTE — Patient Instructions (Addendum)
Good to see you today!  Thanks for coming in.  For the back pain take the Methocarbamol only if severe pain  If mild pain take tylenol arthritis   If you need another muscle relaxer I will prescribe Flexaril which should be cheaper and work as well  If the R great toe gets red and very tender then come back to see [place] right away.  If mild pain then use tylenol.    Come back in 3 months Bring all your medicine bottles

## 2017-05-27 NOTE — Assessment & Plan Note (Signed)
Unsure if this is gout given not very severe.  Normal exam today so do not need xray.  Will follow

## 2017-05-27 NOTE — Progress Notes (Signed)
Subjective  Arthur [name] is a 77 y.o. male is presenting with the following  DIABETES Disease Monitoring: Blood Sugar ranges(Severity) -not checking  Associated Symptoms- Polyuria/phagia/dipsia- no      Visual problems- no Medications: Compliance(Modifying factor) - daily Hypoglycemic symptoms- no Timing - continuous  BACK PAIN Had acute severe spasm went to ER.  Better now.  Taking muscle relaxer rarely.  No weakness or incontinence or fever.    R Large Toe Pain Intermittently has mild pain in great toe.  No redness but some swelling.  Thinks happens after drinking lemonade.  Does not take any medications for it  Monitoring Labs and Parameters Last A1C:  Lab Results  Component Value Date   HGBA1C 6.4 05/27/2017   Last Lipid:     Component Value Date/Time   CHOL 145 07/24/2016 1010   HDL 43 07/24/2016 1010   Last Bmet  Potassium  Date Value Ref Range Status  05/07/2017 4.6 3.5 - 5.1 mmol/L Final   Sodium  Date Value Ref Range Status  05/07/2017 139 135 - 145 mmol/L Final  01/29/2017 140 134 - 144 mmol/L Final   Creat  Date Value Ref Range Status  11/15/2015 1.18 0.70 - 1.18 mg/dL Final    Comment:      For patients > or = 77 years of age: The upper reference limit for Creatinine is approximately 13% higher for people identified as African-American.      Creatinine, Ser  Date Value Ref Range Status  05/07/2017 1.33 (H) 0.61 - 1.24 mg/dL Final           Chief Complaint noted Review of Symptoms - see HPI PMH - Smoking status noted.    Objective Vital Signs reviewed BP 130/78   Pulse 75   Temp 98.7 F (37.1 C) (Oral)   Wt 182 lb (82.6 kg)   SpO2 98%   BMI 26.11 kg/m    Back - no focal tenderness FROM R great toe - no soft tissue swelling or redness or pain or deformity Diabetic Foot Check -  Appearance - no lesions, ulcers or calluses Skin - no unusual pallor or redness Monofilament testing -  Right - Great toe, medial, central, lateral  ball and posterior foot intact Left - Great toe, medial, central, lateral ball and posterior foot intact   Assessments/Plans  See after visit summary for details of patient instuctions  Right foot pain Unsure if this is gout given not very severe.  Normal exam today so do not need xray.  Will follow   Diabetes mellitus, type II Well controlled  BACK PAIN - improved

## 2017-06-05 ENCOUNTER — Other Ambulatory Visit: Payer: Self-pay

## 2017-06-06 MED ORDER — METHOCARBAMOL 500 MG PO TABS: 500.0000 mg | ORAL_TABLET | Freq: Two times a day (BID) | ORAL | 0 refills | Status: DC

## 2017-06-11 ENCOUNTER — Other Ambulatory Visit: Payer: Self-pay

## 2017-06-11 MED ORDER — METHOCARBAMOL 500 MG PO TABS: 500.0000 mg | ORAL_TABLET | Freq: Two times a day (BID) | ORAL | 0 refills | Status: DC

## 2017-06-11 NOTE — Telephone Encounter (Signed)
Pt requesting refill of methocarbam to [facility] [address]. Call bak [phone] Wallace Cullens, RN

## 2017-06-11 NOTE — Telephone Encounter (Signed)
Pt calling again for refill on methocarbam. [facility] on [address]

## 2017-06-16 ENCOUNTER — Other Ambulatory Visit: Payer: Self-pay | Admitting: Family Medicine

## 2017-06-16 NOTE — Telephone Encounter (Signed)
Pt calling a third time trying to get this medication filled. Looks like it was filled on the 15th of May, but it was printed instead of done electronically. I also checked up front to see if it was put up there just in case and it is not there. Pt has been without this medication for a while now and would like to get it ASAP. Please call pt once this has been definitely sent to the pharmacy. Please advise

## 2017-06-17 MED ORDER — METHOCARBAMOL 500 MG PO TABS: 500.0000 mg | ORAL_TABLET | Freq: Two times a day (BID) | ORAL | 0 refills | Status: DC

## 2017-06-17 NOTE — Telephone Encounter (Signed)
Please let him know I sent it in and apologize the last Rx did not go through  Thanks  [facility]

## 2017-06-17 NOTE — Telephone Encounter (Signed)
Pt informed. Mandeep Kiser, CMA  

## 2017-07-02 ENCOUNTER — Other Ambulatory Visit: Payer: Self-pay | Admitting: Family Medicine

## 2017-07-14 ENCOUNTER — Other Ambulatory Visit: Payer: Self-pay | Admitting: *Deleted

## 2017-07-14 MED ORDER — METFORMIN HCL 1000 MG PO TABS: 1000.0000 mg | ORAL_TABLET | Freq: Two times a day (BID) | ORAL | 3 refills | Status: DC

## 2017-07-14 MED ORDER — ATENOLOL 50 MG PO TABS: 50.0000 mg | ORAL_TABLET | Freq: Two times a day (BID) | ORAL | 3 refills | Status: DC

## 2017-07-18 ENCOUNTER — Other Ambulatory Visit: Payer: Self-pay | Admitting: Family Medicine

## 2017-08-05 ENCOUNTER — Ambulatory Visit (INDEPENDENT_AMBULATORY_CARE_PROVIDER_SITE_OTHER): Payer: Medicare HMO | Admitting: Family Medicine

## 2017-08-05 ENCOUNTER — Other Ambulatory Visit: Payer: Self-pay

## 2017-08-05 ENCOUNTER — Encounter: Payer: Self-pay | Admitting: Family Medicine

## 2017-08-05 DIAGNOSIS — R05 Cough: Secondary | ICD-10-CM

## 2017-08-05 DIAGNOSIS — I10 Essential (primary) hypertension: Secondary | ICD-10-CM | POA: Diagnosis not present

## 2017-08-05 DIAGNOSIS — E1169 Type 2 diabetes mellitus with other specified complication: Secondary | ICD-10-CM

## 2017-08-05 DIAGNOSIS — E782 Mixed hyperlipidemia: Secondary | ICD-10-CM | POA: Diagnosis not present

## 2017-08-05 DIAGNOSIS — R058 Other specified cough: Secondary | ICD-10-CM

## 2017-08-05 NOTE — Assessment & Plan Note (Signed)
No evidence of cardiopulmonary cause.  Probable mild PND.  Try nasal saline

## 2017-08-05 NOTE — Progress Notes (Signed)
Subjective  Arthur [name] is a 77 y.o. male is presenting with the following  HYPERTENSION Disease Monitoring: Blood pressure range-not checking Chest pain, palpitations- no      Dyspnea- no Medications: Compliance- brings all in Lightheadedness,Syncope- no   Edema- no  DIABETES Disease Monitoring: Blood Sugar ranges-not checking Polyuria/phagia/dipsia- no      Visual problems- no Medications: Compliance- good Hypoglycemic symptoms- no  HYPERLIPIDEMIA Disease Monitoring: See symptoms for Hypertension Medications: Compliance- good Right upper quadrant pain- no  Muscle aches- no  COUGH Has occsl dry cough.  No fever or chest pain or shortness of breath.  Not taking antihistamine.  Has had this on and off for years.  Does not interfere with activity   Monitoring Labs and Parameters Last A1C:  Lab Results  Component Value Date   HGBA1C 6.4 05/27/2017    Last Lipid:     Component Value Date/Time   CHOL 145 07/24/2016 1010   HDL 43 07/24/2016 1010    Last Bmet  Potassium  Date Value Ref Range Status  05/07/2017 4.6 3.5 - 5.1 mmol/L Final   Sodium  Date Value Ref Range Status  05/07/2017 139 135 - 145 mmol/L Final  01/29/2017 140 134 - 144 mmol/L Final   Creat  Date Value Ref Range Status  11/15/2015 1.18 0.70 - 1.18 mg/dL Final    Comment:      For patients > or = 77 years of age: The upper reference limit for Creatinine is approximately 13% higher for people identified as African-American.      Creatinine, Ser  Date Value Ref Range Status  05/07/2017 1.33 (H) 0.61 - 1.24 mg/dL Final      Last BPs:  BP Readings from Last 3 Encounters:  08/05/17 140/72  05/27/17 130/78  05/07/17 (!) 157/68      Chief Complaint noted Review of Symptoms - see HPI PMH - Smoking status noted.    Objective Vital Signs reviewed BP 140/72   Pulse 75   Temp 98.6 F (37 C) (Oral)   Ht 5\' 10"  (1.778 m)   Wt 180 lb 3.2 oz (81.7 kg)   SpO2 96%   BMI 25.86 kg/m   Heart - Regular rate and rhythm.  No murmurs, gallops or rubs.     Lungs are clear to auscultation, no crackles or wheezes. Extremities:  No cyanosis, edema, or deformity noted with good range of motion of all major joints.   Mouth - no lesions, mucous membranes are moist, no decaying teeth   Nose:  External nasal examination shows no deformity or inflammation. Nasal mucosa are pink and moist without lesions or exudates. No septal dislocation or dislocation.No obstruction to airflow. Throat: normal mucosa, no exudate, uvula midline, no redness  Assessments/Plans  See after visit summary for details of patient instuctions  Diabetes mellitus, type II Well controlled  Essential hypertension, benign BP Readings from Last 3 Encounters:  08/05/17 140/72  05/27/17 130/78  05/07/17 (!) 157/68   At goal   Hyperlipidemia Lab Results  Component Value Date   LDLCALC 69 07/24/2016   At goal   Recurrent cough No evidence of cardiopulmonary cause.  Probable mild PND.  Try nasal saline

## 2017-08-05 NOTE — Assessment & Plan Note (Signed)
Lab Results  Component Value Date   LDLCALC 69 07/24/2016   At goal

## 2017-08-05 NOTE — Assessment & Plan Note (Signed)
Well controlled 

## 2017-08-05 NOTE — Patient Instructions (Signed)
Good to see you today!  Thanks for coming in.  Everything looks good  Nasal Saline spray - 2 sprays three times a day  Come back in 3 months for a diabetes check

## 2017-08-05 NOTE — Assessment & Plan Note (Signed)
BP Readings from Last 3 Encounters:  08/05/17 140/72  05/27/17 130/78  05/07/17 (!) 157/68   At goal

## 2017-09-16 ENCOUNTER — Other Ambulatory Visit: Payer: Self-pay

## 2017-09-16 ENCOUNTER — Ambulatory Visit (INDEPENDENT_AMBULATORY_CARE_PROVIDER_SITE_OTHER): Payer: Medicare HMO | Admitting: Family Medicine

## 2017-09-16 VITALS — BP 124/74 | HR 69 | Temp 98.5°F | Wt 180.0 lb

## 2017-09-16 DIAGNOSIS — R1084 Generalized abdominal pain: Secondary | ICD-10-CM

## 2017-09-16 MED ORDER — SIMETHICONE 80 MG PO CHEW: 80.0000 mg | CHEWABLE_TABLET | Freq: Four times a day (QID) | ORAL | 0 refills | Status: DC | PRN

## 2017-09-16 NOTE — Assessment & Plan Note (Addendum)
Patient presents with 3-day abdominal pain and bloating.  No signs of constipation but reports 2 episodes of loose stools.  Continues to take omeprazole for epigastric pain.  On exam patient is diffusely tender to palpation and mildly distended with normal bowel sounds.  Bloating, distention and pain likely secondary to increase bowel gas in the setting of recent dietary changes.  No concerning signs or symptoms for obstruction, ileus.  Could consider constipation although last bowel movement was yesterday.  Patient is well-appearing.  Will prescribe simethicone 80 mg every 6 as needed.  If symptoms do not improve in the next 3 days patient will return to clinic for further evaluation.

## 2017-09-16 NOTE — Patient Instructions (Signed)
Abdominal Bloating °When you have abdominal bloating, your abdomen may feel full, tight, or painful. It may also look bigger than normal or swollen (distended). Common causes of abdominal bloating include: °· Swallowing air. °· Constipation. °· Problems digesting food. °· Eating too much. °· Irritable bowel syndrome. This is a condition that affects the large intestine. °· Lactose intolerance. This is an inability to digest lactose, a natural sugar in dairy products. °· Celiac disease. This is a condition that affects the ability to digest gluten, a protein found in some grains. °· Gastroparesis. This is a condition that slows down the movement of food in the stomach and small intestine. It is more common in people with diabetes mellitus. °· Gastroesophageal reflux disease (GERD). This is a digestive condition that makes stomach acid flow back into the esophagus. °· Urinary retention. This means that the body is holding onto urine, and the bladder cannot be emptied all the way. ° °Follow these instructions at home: °Eating and drinking °· Avoid eating too much. °· Try not to swallow air while talking or eating. °· Avoid eating while lying down. °· Avoid these foods and drinks: °? Foods that cause gas, such as broccoli, cabbage, cauliflower, and baked beans. °? Carbonated drinks. °? Hard candy. °? Chewing gum. °Medicines °· Take over-the-counter and prescription medicines only as told by your health care provider. °· Take probiotic medicines. These medicines contain live bacteria or yeasts that can help digestion. °· Take coated peppermint oil capsules. °Activity °· Try to exercise regularly. Exercise may help to relieve bloating that is caused by gas and relieve constipation. °General instructions °· Keep all follow-up visits as told by your health care provider. This is important. °Contact a health care provider if: °· You have nausea and vomiting. °· You have diarrhea. °· You have abdominal pain. °· You have  unusual weight loss or weight gain. °· You have severe pain, and medicines do not help. °Get help right away if: °· You have severe chest pain. °· You have trouble breathing. °· You have shortness of breath. °· You have trouble urinating. °· You have darker urine than normal. °· You have blood in your stools or have dark, tarry stools. °Summary °· Abdominal bloating means that the abdomen is swollen. °· Common causes of abdominal bloating are swallowing air, constipation, and problems digesting food. °· Avoid eating too much and avoid swallowing air. °· Avoid foods that cause gas, carbonated drinks, hard candy, and chewing gum. °This information is not intended to replace advice given to you by your health care provider. Make sure you discuss any questions you have with your health care provider. °Document Released: 02/16/2016 Document Revised: 02/16/2016 Document Reviewed: 02/16/2016 °Elsevier Interactive Patient Education © 2018 Elsevier Inc. ° °

## 2017-09-16 NOTE — Progress Notes (Signed)
   Subjective:    Patient ID: Arthur [name], male    DOB: 11-13-1940, 77 y.o.   MRN: [id number]   CC: Abdominal pain  HPI: Patient is a 77 yo male who presents today complaining of abdominal pain for the past 3 days. Patient report that pain is dull and diffuse. He reports bloating and feels like he has a lot of gas. He reports that he has not had a bowel movement today but usually has 2 daily. Last BM was yesterday, he reports two episodes of watery stools in the past few days.  Patient denies any constipation.  Patient reports that he has been eating a lot of potatoes.  He continues to take his omeprazole as needed for heartburn.  Patient denies any nausea, vomiting, blood in stool.  Patient also denies any chest pain or shortness of breath.   Smoking status reviewed   ROS: all other systems were reviewed and are negative other than in the HPI   Past Medical History:  Diagnosis Date  . Diabetes mellitus without complication ([place])   . High cholesterol   . Hypertension   . Normal nuclear stress test 2010   stress perfusion study apparently in 2010 in [facility] which he said was negative.    Past Surgical History:  Procedure Laterality Date  . CATARACT EXTRACTION, BILATERAL  2006  . KNEE SURGERY  2005    Past medical history, surgical, family, and social history reviewed and updated in the EMR as appropriate.  Objective:  BP 124/74   Pulse 69   Temp 98.5 F (36.9 C) (Oral)   Wt 180 lb (81.6 kg)   SpO2 95%   BMI 25.83 kg/m   Vitals and nursing note reviewed  General: NAD, pleasant, able to participate in exam Cardiac: RRR, normal heart sounds, no murmurs. 2+ radial and PT pulses bilaterally Respiratory: CTAB, normal effort, No wheezes, rales or rhonchi Abdomen: soft, nontender, mildly distended, no hepatic or splenomegaly, +BS Extremities: no edema or cyanosis. WWP. Skin: warm and dry, no rashes noted Neuro: alert and oriented x4, no focal deficits Psych:  Normal affect and mood   Assessment & Plan:    Generalized abdominal pain Patient presents with 3-day abdominal pain and bloating.  No signs of constipation but reports 2 episodes of loose stools.  Continues to take omeprazole for epigastric pain.  On exam patient is diffusely tender to palpation and mildly distended with normal bowel sounds.  Bloating, distention and pain likely secondary to increase bowel gas in the setting of recent dietary changes.  No concerning signs or symptoms for obstruction, ileus.  Could consider constipation although last bowel movement was yesterday.  Patient is well-appearing.  Will prescribe simethicone 80 mg every 6 as needed.  If symptoms do not improve in the next 3 days patient will return to clinic for further evaluation.   Marjie Skiff, MD [place] PGY-3

## 2017-09-26 ENCOUNTER — Encounter: Payer: Self-pay | Admitting: Family Medicine

## 2017-09-26 ENCOUNTER — Ambulatory Visit (INDEPENDENT_AMBULATORY_CARE_PROVIDER_SITE_OTHER): Payer: Medicare HMO | Admitting: Family Medicine

## 2017-09-26 ENCOUNTER — Other Ambulatory Visit: Payer: Self-pay

## 2017-09-26 VITALS — BP 122/72 | HR 69 | Temp 98.4°F | Ht 70.0 in | Wt 180.0 lb

## 2017-09-26 DIAGNOSIS — R05 Cough: Secondary | ICD-10-CM

## 2017-09-26 DIAGNOSIS — I10 Essential (primary) hypertension: Secondary | ICD-10-CM | POA: Diagnosis not present

## 2017-09-26 DIAGNOSIS — E1169 Type 2 diabetes mellitus with other specified complication: Secondary | ICD-10-CM | POA: Diagnosis not present

## 2017-09-26 DIAGNOSIS — R1084 Generalized abdominal pain: Secondary | ICD-10-CM | POA: Diagnosis not present

## 2017-09-26 DIAGNOSIS — R058 Other specified cough: Secondary | ICD-10-CM

## 2017-09-26 LAB — POCT GLYCOSYLATED HEMOGLOBIN (HGB A1C): HbA1c, POC (controlled diabetic range): 6.3 % (ref 0.0–7.0)

## 2017-09-26 MED ORDER — BENZONATATE 100 MG PO CAPS: 100.0000 mg | ORAL_CAPSULE | Freq: Two times a day (BID) | ORAL | 1 refills | Status: DC | PRN

## 2017-09-26 NOTE — Assessment & Plan Note (Signed)
Improved

## 2017-09-26 NOTE — Assessment & Plan Note (Signed)
No signs of infection - treat with tessalon

## 2017-09-26 NOTE — Progress Notes (Signed)
Subjective  Arthur [name] is a 77 y.o. male is presenting with the following  ABDOMINAL PAIN Feeling much better.  His bowel movement are back to normal.  Still mild epigastric pain when he coughs.  No fever or bleeding  COUGH Recurrence of his intermittent cough.  No change in sputum or fever or chest pain or shortness of breath   HYPERTENSION Disease Monitoring: Blood pressure range-has not been checking Chest pain, palpitations- no      Dyspnea- no Medications: Compliance- brings all in  Lightheadedness,Syncope- no   Edema- no  DIABETES Disease Monitoring: Blood Sugar ranges-not checking Polyuria/phagia/dipsia- no      Visual problems- no Medications: Compliance- metformin bid Hypoglycemic symptoms- no  HYPERLIPIDEMIA Disease Monitoring: See symptoms for Hypertension Medications: Compliance- pravastatin daily  Right upper quadrant pain- no  Muscle aches- no  Monitoring Labs and Parameters Last A1C:  Lab Results  Component Value Date   HGBA1C 6.3 09/26/2017    Last Lipid:     Component Value Date/Time   CHOL 145 07/24/2016 1010   HDL 43 07/24/2016 1010    Last Bmet  Potassium  Date Value Ref Range Status  05/07/2017 4.6 3.5 - 5.1 mmol/L Final   Sodium  Date Value Ref Range Status  05/07/2017 139 135 - 145 mmol/L Final  01/29/2017 140 134 - 144 mmol/L Final   Creat  Date Value Ref Range Status  11/15/2015 1.18 0.70 - 1.18 mg/dL Final    Comment:      For patients > or = 77 years of age: The upper reference limit for Creatinine is approximately 13% higher for people identified as African-American.      Creatinine, Ser  Date Value Ref Range Status  05/07/2017 1.33 (H) 0.61 - 1.24 mg/dL Final      Last BPs:  BP Readings from Last 3 Encounters:  09/26/17 122/72  09/16/17 124/74  08/05/17 140/72    Chief Complaint noted Review of Symptoms - see HPI PMH - Smoking status noted.    Objective Vital Signs reviewed BP 122/72   Pulse 69    Temp 98.4 F (36.9 C) (Oral)   Ht 5\' 10"  (1.778 m)   Wt 180 lb (81.6 kg)   SpO2 96%   BMI 25.83 kg/m  Heart - Regular rate and rhythm.  No murmurs, gallops or rubs.    Lungs:  Normal respiratory effort, chest expands symmetrically. Lungs are clear to auscultation, no crackles or wheezes. Abdomen: soft and non-tender without masses, organomegaly or hernias noted.  No guarding or rebound Extremities:  No cyanosis, edema, or deformity noted with good range of motion of all major joints.    Assessments/Plans  See after visit summary for details of patient instuctions  Diabetes mellitus, type II Well controlled cut down on metformin to 500 mg twice a day   Essential hypertension, benign Well controlled   Generalized abdominal pain Improved   Recurrent cough No signs of infection - treat with tessalon

## 2017-09-26 NOTE — Assessment & Plan Note (Signed)
Well controlled cut down on metformin to 500 mg twice a day

## 2017-09-26 NOTE — Assessment & Plan Note (Signed)
Well controlled 

## 2017-09-26 NOTE — Patient Instructions (Addendum)
Good to see you today!  Thanks for coming in.  If the abdominal pain comes back or if you have fever or shortness of breath  Take the tessalon perles twice a day as needed for cough  Take 1/2 metformin twice a day   Come back in 3 months for an diabetes check

## 2017-11-06 ENCOUNTER — Ambulatory Visit (INDEPENDENT_AMBULATORY_CARE_PROVIDER_SITE_OTHER): Payer: Medicare HMO | Admitting: Family Medicine

## 2017-11-06 ENCOUNTER — Other Ambulatory Visit: Payer: Self-pay

## 2017-11-06 VITALS — BP 112/60 | HR 71 | Temp 97.6°F | Wt 185.0 lb

## 2017-11-06 DIAGNOSIS — S93402A Sprain of unspecified ligament of left ankle, initial encounter: Secondary | ICD-10-CM | POA: Diagnosis not present

## 2017-11-06 MED ORDER — NAPROXEN 500 MG PO TABS: 500.0000 mg | ORAL_TABLET | Freq: Two times a day (BID) | ORAL | 0 refills | Status: DC

## 2017-11-06 NOTE — Progress Notes (Signed)
  Subjective:    Patient ID: Arthur [name], male    DOB: April 08, 1940, 77 y.o.   MRN: [id number]   CC: twisted ankle  HPI: L Ankle injury  Patient injured ankle on 10/08 with a  Eversion while walking. He since has taken naproxen and used BenGay.  Patient reports that his swelling and pain have improved greatly since he twisted his ankle.  He reports that his swelling has gone down.  Patient does report that he is still having pain in the bottom of his heel after he walks for long time.  Patient reports that he works where he walks at least 10 hours per night and is requesting that he could have a note for missing work the past couple of days and also would like to walk less given his ankle.  Patient reports the pain is a 6 out of 10 when it is at its worst.  Patient is currently not having any pain.  Smoking status reviewed  ROS: 10 point ROS is otherwise negative, except as mentioned in HPI  Patient Active Problem List   Diagnosis Date Noted  . Sprain of left ankle 11/06/2017  . Generalized abdominal pain 09/16/2017  . History of pulmonary embolism   . Recurrent cough 03/15/2014  . GERD (gastroesophageal reflux disease) 07/05/2013  . Osteoarthritis 02/26/2010  . GOUT, UNSPECIFIED 11/06/2009  . Diabetes mellitus, type II ([place]) 09/22/2009  . Hyperlipidemia 09/22/2009  . OBESITY, UNSPECIFIED 09/22/2009  . Essential hypertension, benign 09/22/2009     Objective:  BP 112/60   Pulse 71   Temp 97.6 F (36.4 C) (Oral)   Wt 185 lb (83.9 kg)   SpO2 98%   BMI 26.54 kg/m  Vitals and nursing note reviewed  General: NAD, pleasant Respiratory:  normal effort Left ankle: Patient with some tenderness over her back heel, no bone tenderness at the posterior edge or tip of the lateral or medial malleolus Extremities: mild edema of L anle or no cyanosis. WWP. Skin: warm and dry, no rashes noted Neuro: alert and oriented, no focal deficits Psych: normal affect  Assessment & Plan:    Sprain of left ankle Patient given note for days missed work and encouraged to take it easy at work given that he walks 10 hours a night.  Patient to continue naproxen 500 mg twice daily for next couple of days as needed for swelling in his ankle.  Encouraged not to continue this long-term given kidney function.  Patient instructed to use ice for the swelling as needed. Given strict return precautions.  [place] Ronda Rajkumar, DO Family Medicine Resident PGY-2

## 2017-11-06 NOTE — Patient Instructions (Signed)
Thank you for coming to see me today. It was a pleasure! Today we talked about:   Your ankle sprain. You may alternate ice and heat on the area. You can take naproxen 500 mg twice daily as needed for pain. You ankle should feel better by 4 weeks. Please return if it gets worse.   Please follow-up with your primary care provider as needed.  If you have any questions or concerns, please do not hesitate to call the office at [phone].  Take Care,   [place] Iara Monds, DO Ankle Sprain An ankle sprain is a stretch or tear in one of the tough tissues (ligaments) in your ankle. Follow these instructions at home:  Rest your ankle.  Take over-the-counter and prescription medicines only as told by your doctor.  For 2-3 days, keep your ankle higher than the level of your heart (elevated) as much as possible.  If directed, put ice on the area: ? Put ice in a plastic bag. ? Place a towel between your skin and the bag. ? Leave the ice on for 20 minutes, 2-3 times a day.  If you were given a brace: ? Wear it as told. ? Take it off to shower or bathe. ? Try not to move your ankle much, but wiggle your toes from time to time. This helps to prevent swelling.  If you were given an elastic bandage (dressing): ? Take it off when you shower or bathe. ? Try not to move your ankle much, but wiggle your toes from time to time. This helps to prevent swelling. ? Adjust the bandage to make it more comfortable if it feels too tight. ? Loosen the bandage if you lose feeling in your foot, your foot tingles, or your foot gets cold and blue.  If you have crutches, use them as told by your doctor. Continue to use them until you can walk without feeling pain in your ankle. Contact a doctor if:  Your bruises or swelling are quickly getting worse.  Your pain does not get better after you take medicine. Get help right away if:  You cannot feel your toes or foot.  Your toes or your foot looks blue.  You  have very bad pain that gets worse. This information is not intended to replace advice given to you by your health care provider. Make sure you discuss any questions you have with your health care provider. Document Released: 07/03/2007 Document Revised: 06/22/2015 Document Reviewed: 08/16/2014 Elsevier Interactive Patient Education  [employer].

## 2017-11-06 NOTE — Assessment & Plan Note (Signed)
Patient given note for days missed work and encouraged to take it easy at work given that he walks 10 hours a night.  Patient to continue naproxen 500 mg twice daily for next couple of days as needed for swelling in his ankle.  Encouraged not to continue this long-term given kidney function.  Patient instructed to use ice for the swelling as needed. Given strict return precautions.

## 2017-11-07 ENCOUNTER — Telehealth: Payer: Self-pay | Admitting: Family Medicine

## 2017-11-07 NOTE — Telephone Encounter (Signed)
Pt was seen by Dr. Enid Derry on 11/06/17 and has a sprained ankle. He states he was only written out of work until 11/07/17. He would like to know if Dr. Enid Derry can write him out for an extra week. Please call pt if this is possible.

## 2017-11-10 ENCOUNTER — Encounter: Payer: Self-pay | Admitting: Family Medicine

## 2017-11-10 NOTE — Telephone Encounter (Signed)
Pt is calling to check on the status of getting a note written for work. Please call pt when the note is ready to be picked up.

## 2017-11-11 ENCOUNTER — Encounter: Payer: Self-pay | Admitting: Family Medicine

## 2017-11-11 DIAGNOSIS — E119 Type 2 diabetes mellitus without complications: Secondary | ICD-10-CM | POA: Diagnosis not present

## 2017-11-11 DIAGNOSIS — Z7984 Long term (current) use of oral hypoglycemic drugs: Secondary | ICD-10-CM | POA: Diagnosis not present

## 2017-11-11 DIAGNOSIS — R609 Edema, unspecified: Secondary | ICD-10-CM | POA: Diagnosis not present

## 2017-11-11 DIAGNOSIS — K219 Gastro-esophageal reflux disease without esophagitis: Secondary | ICD-10-CM | POA: Diagnosis not present

## 2017-11-11 DIAGNOSIS — I10 Essential (primary) hypertension: Secondary | ICD-10-CM | POA: Diagnosis not present

## 2017-11-11 DIAGNOSIS — Z833 Family history of diabetes mellitus: Secondary | ICD-10-CM | POA: Diagnosis not present

## 2017-11-11 DIAGNOSIS — E785 Hyperlipidemia, unspecified: Secondary | ICD-10-CM | POA: Diagnosis not present

## 2017-11-11 DIAGNOSIS — Z8249 Family history of ischemic heart disease and other diseases of the circulatory system: Secondary | ICD-10-CM | POA: Diagnosis not present

## 2017-11-11 NOTE — Telephone Encounter (Signed)
Covering for Dr. Bonnita Levan inbox. Reviewed notes and think an extra week is appropriate. Have placed an excuse under the "s" folder at the front desk. Please let the patient know this is available for pickup.  Guadalupe Dawn MD PGY-2 Family Medicine Resident

## 2017-11-11 NOTE — Telephone Encounter (Signed)
Pt informed. Deseree Blount, CMA  

## 2017-11-12 ENCOUNTER — Other Ambulatory Visit: Payer: Self-pay

## 2017-11-12 ENCOUNTER — Encounter: Payer: Self-pay | Admitting: Family Medicine

## 2017-11-12 ENCOUNTER — Ambulatory Visit (INDEPENDENT_AMBULATORY_CARE_PROVIDER_SITE_OTHER): Payer: Medicare HMO | Admitting: Family Medicine

## 2017-11-12 DIAGNOSIS — Z23 Encounter for immunization: Secondary | ICD-10-CM | POA: Diagnosis not present

## 2017-11-12 DIAGNOSIS — S93402A Sprain of unspecified ligament of left ankle, initial encounter: Secondary | ICD-10-CM | POA: Diagnosis not present

## 2017-11-12 NOTE — Progress Notes (Signed)
Subjective  Arthur [name] is a 77 y.o. male is presenting with the following  ANKLE SPRAIN FOLLOW UP Feels is some better but still sore when walks much.  No giving way or fever or worsening swelling.  Taking naprosyn or tylenol for pain.  Not using any brace or wrap   Chief Complaint noted Review of Symptoms - see HPI PMH - Smoking status noted.    Objective Vital Signs reviewed BP 126/68   Pulse 70   Temp 98.6 F (37 C) (Oral)   Ht 5\' 10"  (1.778 m)   Wt 183 lb 6.4 oz (83.2 kg)   SpO2 99%   BMI 26.32 kg/m   Ankle L - mild soft tissue swelling laterally with good range of motion.  No deformity and walking with mild limp  Assessments/Plans  See after visit summary for details of patient instuctions  Sprain of left ankle Improving.  See letter for restrictions

## 2017-11-12 NOTE — Assessment & Plan Note (Signed)
Improving.  See letter for restrictions

## 2017-11-12 NOTE — Patient Instructions (Signed)
Good to see you today!  Thanks for coming in.  Your ankle should slowly get better over the next few months  Wear the compression bandage when you are walking a lot  Come back in 3 months for diabetes check

## 2017-12-06 DIAGNOSIS — R69 Illness, unspecified: Secondary | ICD-10-CM | POA: Diagnosis not present

## 2018-01-04 ENCOUNTER — Other Ambulatory Visit: Payer: Self-pay | Admitting: Family Medicine

## 2018-01-06 ENCOUNTER — Telehealth: Payer: Self-pay | Admitting: *Deleted

## 2018-01-06 NOTE — Telephone Encounter (Signed)
Received fax from pharmacy, PA needed on diclofenac gel.  Clinical questions submitted via Cover My Meds.  Waiting on response, could take up to 72 hours.  Cover My Meds info: Key: [id number]  [name], Arthur Spotted, Arthur [name]

## 2018-01-07 ENCOUNTER — Ambulatory Visit (INDEPENDENT_AMBULATORY_CARE_PROVIDER_SITE_OTHER): Payer: Medicare HMO | Admitting: Family Medicine

## 2018-01-07 ENCOUNTER — Encounter: Payer: Self-pay | Admitting: Family Medicine

## 2018-01-07 VITALS — BP 110/60 | HR 72 | Temp 98.8°F | Wt 181.0 lb

## 2018-01-07 DIAGNOSIS — E1169 Type 2 diabetes mellitus with other specified complication: Secondary | ICD-10-CM | POA: Diagnosis not present

## 2018-01-07 DIAGNOSIS — M153 Secondary multiple arthritis: Secondary | ICD-10-CM

## 2018-01-07 DIAGNOSIS — I10 Essential (primary) hypertension: Secondary | ICD-10-CM | POA: Diagnosis not present

## 2018-01-07 DIAGNOSIS — E782 Mixed hyperlipidemia: Secondary | ICD-10-CM | POA: Diagnosis not present

## 2018-01-07 LAB — POCT GLYCOSYLATED HEMOGLOBIN (HGB A1C): HBA1C, POC (CONTROLLED DIABETIC RANGE): 6.4 % (ref 0.0–7.0)

## 2018-01-07 NOTE — Assessment & Plan Note (Signed)
Well controlled continue current regimen.  

## 2018-01-07 NOTE — Telephone Encounter (Signed)
Approved until 01/28/19.  LMOVM of pharmacy. Jack Mineau, Salome Spotted, CMA

## 2018-01-07 NOTE — Patient Instructions (Addendum)
Good to see you today!  Thanks for coming in.  Take 1/2 of 1000 mg metformin twice a day  Use Tylenol first for arthritis pain before using Ibuprofen or Naprosyn   See your Eye Dr - Dr Posey Pronto   Come back in 3-6  months

## 2018-01-07 NOTE — Assessment & Plan Note (Signed)
Mild in foot See after visit summary

## 2018-01-07 NOTE — Assessment & Plan Note (Signed)
Lab Results  Component Value Date   LDLCALC 69 07/24/2016   Well controlled continue current regimen

## 2018-01-07 NOTE — Progress Notes (Signed)
Subjective  Arthur [name] is a 77 y.o. male is presenting with the following  FOOT PAIN Having intermittent pain in left mid lateral foot.  Better with ibuprofen.  No recent injury or redness or soft tissue swelling  HYPERTENSION Disease Monitoring: Blood pressure range-not checking Chest pain, palpitations- no      Dyspnea- no Medications: Compliance- brings all meds Lightheadedness,Syncope- no   Edema- no  DIABETES Disease Monitoring: Blood Sugar ranges-not chcking Polyuria/phagia/dipsia- no      Visual problems- no Medications: Compliance- metformin 500 mg daily Hypoglycemic symptoms- no  HYPERLIPIDEMIA Disease Monitoring: See symptoms for Hypertension Medications: Compliance- brings meds Right upper quadrant pain- no  Muscle aches- no  Monitoring Labs and Parameters Last A1C:  Lab Results  Component Value Date   HGBA1C 6.4 01/07/2018    Last Lipid:     Component Value Date/Time   CHOL 145 07/24/2016 1010   HDL 43 07/24/2016 1010    Last Bmet  Potassium  Date Value Ref Range Status  05/07/2017 4.6 3.5 - 5.1 mmol/L Final   Sodium  Date Value Ref Range Status  05/07/2017 139 135 - 145 mmol/L Final  01/29/2017 140 134 - 144 mmol/L Final   Creat  Date Value Ref Range Status  11/15/2015 1.18 0.70 - 1.18 mg/dL Final    Comment:      For patients > or = 77 years of age: The upper reference limit for Creatinine is approximately 13% higher for people identified as African-American.      Creatinine, Ser  Date Value Ref Range Status  05/07/2017 1.33 (H) 0.61 - 1.24 mg/dL Final      Last BPs:  BP Readings from Last 3 Encounters:  01/07/18 110/60  11/12/17 126/68  11/06/17 112/60       Chief Complaint noted Review of Symptoms - see HPI PMH - Smoking status noted.    Objective Vital Signs reviewed BP 110/60   Pulse 72   Temp 98.8 F (37.1 C) (Oral)   Wt 181 lb (82.1 kg)   SpO2 97%   BMI 25.97 kg/m  Heart - Regular rate and rhythm.  No  murmurs, gallops or rubs.    Lungs:  Normal respiratory effort, chest expands symmetrically. Lungs are clear to auscultation, no crackles or wheezes. Feet - no deformity or soft tissue swelling or redness.  Mild pain over mild lateral Left foot  Assessments/Plans  See after visit summary for details of patient instuctions  Diabetes mellitus, type II Well controlled continue current regimen  Essential hypertension, benign Well controlled continue current regimen  Hyperlipidemia Lab Results  Component Value Date   [place] 69 07/24/2016   Well controlled continue current regimen  Osteoarthritis Mild in foot See after visit summary

## 2018-01-12 ENCOUNTER — Telehealth: Payer: Self-pay

## 2018-01-12 NOTE — Telephone Encounter (Signed)
Pt called to let Dr. Erin Hearing know that the insurance won't cover the gel. Pt states He was told to call Dr. Erin Hearing if it wasn't covered. Pt would like the Rx sent to [facility] . Ottis Stain, CMA

## 2018-01-30 ENCOUNTER — Ambulatory Visit (INDEPENDENT_AMBULATORY_CARE_PROVIDER_SITE_OTHER): Payer: Medicare HMO | Admitting: Family Medicine

## 2018-01-30 ENCOUNTER — Other Ambulatory Visit: Payer: Self-pay

## 2018-01-30 VITALS — BP 110/48 | HR 74 | Temp 98.9°F | Wt 183.5 lb

## 2018-01-30 DIAGNOSIS — R05 Cough: Secondary | ICD-10-CM

## 2018-01-30 DIAGNOSIS — R058 Other specified cough: Secondary | ICD-10-CM

## 2018-01-30 NOTE — Progress Notes (Signed)
   Subjective:    Arthur [name] - 78 y.o. male MRN [id number]  Date of birth: 1940-07-17  CC:  Arthur [name] is here for a cough.  HPI: Cough - started December 30 - has been taking Tessalon Perles every 6 hours  - also has Mucinex DM, which he has not been taking  - his cough is wet and has improved by about 70% - entire family also has the same cough - denies other symptoms, including congestion, rhinorrhea,   Health Maintenance:  Health Maintenance Due  Topic Date Due  . DTaP/Tdap/Td (1 - Tdap) 09/05/1951  . TETANUS/TDAP  09/05/1959  . OPHTHALMOLOGY EXAM  10/29/2017    -  reports that he has never smoked. He has never used smokeless tobacco. - Review of Systems: Per HPI. - Past Medical History: Patient Active Problem List   Diagnosis Date Noted  . Sprain of left ankle 11/06/2017  . History of pulmonary embolism   . Recurrent cough 03/15/2014  . GERD (gastroesophageal reflux disease) 07/05/2013  . Osteoarthritis 02/26/2010  . GOUT, UNSPECIFIED 11/06/2009  . Diabetes mellitus, type II ([place]) 09/22/2009  . Hyperlipidemia 09/22/2009  . OBESITY, UNSPECIFIED 09/22/2009  . Essential hypertension, benign 09/22/2009   - Medications: reviewed and updated   Objective:   Physical Exam BP (!) 110/48   Pulse 74   Temp 98.9 F (37.2 C) (Oral)   Wt 183 lb 8 oz (83.2 kg)   SpO2 94%   BMI 26.33 kg/m  Gen: NAD, alert, cooperative with exam, well-appearing HEENT: NCAT, clear conjunctiva, oropharynx clear, supple neck CV: RRR, good S1/S2, no murmur Resp: CTABL, no wheezes, non-labored    Assessment & Plan:   Recurrent cough Appears to be improving.  Advised patient that he can take Tessalon Perles and Mucinex at the same time if he finds them helpful, but his cough may linger for a few more weeks.  Lung exam was reassuring.  Encouraged him to drink hot tea with honey, which she is already doing.  Patient was happy with this plan and already has a follow-up for next  Wednesday.    Maia Breslow, M.D. 01/30/2018, 12:05 PM PGY-2, [place]

## 2018-01-30 NOTE — Patient Instructions (Signed)
It was nice meeting you today Arthur [name]!  For your cough, you can continue taking the Tessalon as you have been and take Mucinex DM every 12 hours as needed.  Please continue drinking hot liquids with honey to soothe your cough.  I am glad you are starting to feel better, and you should continue to improve, but your cough may linger for a few more weeks.  If you develop chills, fevers, shortness of breath, or worsening cough, please call our clinic or come in to be seen.  If you have any questions or concerns, please feel free to call the clinic.   Be well,  Dr. Shan Levans

## 2018-01-30 NOTE — Assessment & Plan Note (Signed)
Appears to be improving.  Advised patient that he can take Tessalon Perles and Mucinex at the same time if he finds them helpful, but his cough may linger for a few more weeks.  Lung exam was reassuring.  Encouraged him to drink hot tea with honey, which she is already doing.  Patient was happy with this plan and already has a follow-up for next Wednesday.

## 2018-02-03 ENCOUNTER — Other Ambulatory Visit: Payer: Self-pay | Admitting: Family Medicine

## 2018-02-04 ENCOUNTER — Encounter: Payer: Self-pay | Admitting: Family Medicine

## 2018-02-04 ENCOUNTER — Other Ambulatory Visit: Payer: Self-pay

## 2018-02-04 ENCOUNTER — Ambulatory Visit (INDEPENDENT_AMBULATORY_CARE_PROVIDER_SITE_OTHER): Payer: Medicare HMO | Admitting: Family Medicine

## 2018-02-04 DIAGNOSIS — R05 Cough: Secondary | ICD-10-CM

## 2018-02-04 DIAGNOSIS — M153 Secondary multiple arthritis: Secondary | ICD-10-CM

## 2018-02-04 DIAGNOSIS — R058 Other specified cough: Secondary | ICD-10-CM

## 2018-02-04 NOTE — Patient Instructions (Signed)
Good to see you today!  Thanks for coming in.  Robitussion DexMeth - syrup liquid every 6 hours instead of the pills  If not better in 2 weeks or if you have fever or shortness of breath    Try Aspercreme over the counter instead of diclofenac get

## 2018-02-04 NOTE — Progress Notes (Signed)
Subjective  Arthur [name] is a 78 y.o. male is presenting with the following  COUGH Follow up seen 4 days ago.  Is a little better.   No shortness of breath or fever or yellow sputum or chest pain or leg swelling.  Taking tessalon and Mucinex.   Would like a stronger cough syryp  ARTHRITIS Disease monitoring Most involved joints: knees and elbow.   Joint swelling: no    Joint Redness: no  Limitations: keeps active  Medication Management Compliance:  Could not get diclofenac gel    Abdominal pain: no   GI Bleeding:  no  Chief Complaint noted Review of Symptoms - see HPI PMH - Smoking status noted.    Objective Vital Signs reviewed BP 122/78   Pulse 73   Temp 99.1 F (37.3 C) (Oral)   Ht 5\' 10"  (1.778 m)   Wt 184 lb 6.4 oz (83.6 kg)   SpO2 93%   BMI 26.46 kg/m  Alert nad Lungs:  Normal respiratory effort, chest expands symmetrically. Lungs are clear to auscultation, no crackles or wheezes. Heart - Regular rate and rhythm.  No murmurs, gallops or rubs.    Extremities:  No cyanosis, edema, or deformity noted with good range of motion of all major joints.    Assessments/Plans  See after visit summary for details of patient instuctions  Recurrent cough Stable.   Discussed taking a narcotic cough syrup would be dangerous and he agrees.  Suggested could try liquid dexamethophan which might help his symptoms a little.  See after visit summary   Osteoarthritis Stable.  Recommend otc aspercreme since can not get diclofenac gel

## 2018-02-04 NOTE — Assessment & Plan Note (Signed)
Stable.  Recommend otc aspercreme since can not get diclofenac gel

## 2018-02-04 NOTE — Assessment & Plan Note (Signed)
Stable.   Discussed taking a narcotic cough syrup would be dangerous and he agrees.  Suggested could try liquid dexamethophan which might help his symptoms a little.  See after visit summary

## 2018-02-05 ENCOUNTER — Other Ambulatory Visit: Payer: Self-pay | Admitting: Family Medicine

## 2018-03-03 ENCOUNTER — Other Ambulatory Visit: Payer: Self-pay

## 2018-03-03 ENCOUNTER — Ambulatory Visit (INDEPENDENT_AMBULATORY_CARE_PROVIDER_SITE_OTHER): Payer: Medicare HMO | Admitting: Family Medicine

## 2018-03-03 ENCOUNTER — Ambulatory Visit: Payer: Medicare HMO | Admitting: Family Medicine

## 2018-03-03 ENCOUNTER — Encounter: Payer: Self-pay | Admitting: Family Medicine

## 2018-03-03 DIAGNOSIS — M153 Secondary multiple arthritis: Secondary | ICD-10-CM

## 2018-03-03 NOTE — Patient Instructions (Signed)
Good to see you today!  Thanks for coming in.  Try Aspercreme over the counter - 4 x a day   Come back in April for a diabetes check and blood test

## 2018-03-03 NOTE — Progress Notes (Signed)
Subjective  Arthur [name] is a 78 y.o. male is presenting with the following  LEG PAIN Has bilateral lower leg aching after being on his feet at work.  Is better when rests.  Using tylenol as needed. Does not have topical NSAID.   No edema or redness or weakness  Chief Complaint noted Review of Symptoms - see HPI PMH - Smoking status noted.    Objective Vital Signs reviewed BP 116/70   Pulse 70   Temp 98.7 F (37.1 C) (Oral)   Ht 5\' 10"  (1.778 m)   Wt 183 lb (83 kg)   SpO2 97%   BMI 26.26 kg/m  Mobility:able to get up and down from exam table without assistance or distress Extremities:  No cyanosis, edema, or deformity noted with good range of motion of all major joints.   No focal pain or swelling in calves  Knees - no edema but significant crepitus bilaterally   Assessments/Plans  See after visit summary for details of patient instuctions  Osteoarthritis Leg pain consistent with mild arthritis No signs of dvt or infection.  See after visit summary

## 2018-03-03 NOTE — Assessment & Plan Note (Signed)
Leg pain consistent with mild arthritis No signs of dvt or infection.  See after visit summary

## 2018-03-04 ENCOUNTER — Ambulatory Visit: Payer: Medicare HMO | Admitting: Family Medicine

## 2018-04-08 ENCOUNTER — Encounter: Payer: Self-pay | Admitting: Family Medicine

## 2018-04-08 ENCOUNTER — Ambulatory Visit (INDEPENDENT_AMBULATORY_CARE_PROVIDER_SITE_OTHER): Payer: Medicare HMO | Admitting: Family Medicine

## 2018-04-08 ENCOUNTER — Other Ambulatory Visit: Payer: Self-pay

## 2018-04-08 VITALS — BP 130/72 | HR 61 | Temp 98.8°F | Ht 70.0 in | Wt 183.0 lb

## 2018-04-08 DIAGNOSIS — E1169 Type 2 diabetes mellitus with other specified complication: Secondary | ICD-10-CM | POA: Diagnosis not present

## 2018-04-08 DIAGNOSIS — M153 Secondary multiple arthritis: Secondary | ICD-10-CM

## 2018-04-08 DIAGNOSIS — M65319 Trigger thumb, unspecified thumb: Secondary | ICD-10-CM | POA: Insufficient documentation

## 2018-04-08 DIAGNOSIS — M65312 Trigger thumb, left thumb: Secondary | ICD-10-CM | POA: Diagnosis not present

## 2018-04-08 LAB — POCT GLYCOSYLATED HEMOGLOBIN (HGB A1C): HbA1c, POC (controlled diabetic range): 6.5 % (ref 0.0–7.0)

## 2018-04-08 MED ORDER — TROLAMINE SALICYLATE 10 % EX CREA: 1.0000 "application " | TOPICAL_CREAM | CUTANEOUS | 0 refills | Status: DC | PRN

## 2018-04-08 NOTE — Patient Instructions (Signed)
Good to see you today!  Thanks for coming in.  Make an appointment for a diabetes eye check  Use the aspercreme on the thumb and foot for pain and swelling.   If the thumb is still bothering you a lot in 6 weeks then call me and we can refer you to hand surgeon   Come back in 6-8 weeks

## 2018-04-08 NOTE — Assessment & Plan Note (Signed)
I think has a mild flare in L foot from overuse.  Try aspercreme

## 2018-04-08 NOTE — Assessment & Plan Note (Signed)
Try conservative therapy and monitor.

## 2018-04-08 NOTE — Progress Notes (Signed)
Subjective  Arthur [name] is a 78 y.o. male is presenting with the following  THUMB CLICKING Left thumb is sticking and clicking when straightening for the last week or so.  No remembered trauma or change in activity.  No pain or soft tissue swelling   FOOT PAIN Left mid foot pain when has been standing and walking all day.  Is better when rests.  No trauma or fever or soft tissue swelling.  Not taking any medication  DIABETES Disease Monitoring: Blood Sugar ranges(Severity) -Not checking  Associated Symptoms- Polyuria/phagia/dipsia- no      Visual problems- no Medications: Compliance(Modifying factor) - brings all his meds Hypoglycemic symptoms- no Timing - continuous  Monitoring Labs and Parameters Last A1C:  Lab Results  Component Value Date   HGBA1C 6.5 04/08/2018   Last Lipid:     Component Value Date/Time   CHOL 145 07/24/2016 1010   HDL 43 07/24/2016 1010   Last Bmet  Potassium  Date Value Ref Range Status  05/07/2017 4.6 3.5 - 5.1 mmol/L Final   Sodium  Date Value Ref Range Status  05/07/2017 139 135 - 145 mmol/L Final  01/29/2017 140 134 - 144 mmol/L Final   Creat  Date Value Ref Range Status  11/15/2015 1.18 0.70 - 1.18 mg/dL Final    Comment:      For patients > or = 78 years of age: The upper reference limit for Creatinine is approximately 13% higher for people identified as African-American.      Creatinine, Ser  Date Value Ref Range Status  05/07/2017 1.33 (H) 0.61 - 1.24 mg/dL Final           Chief Complaint noted Review of Symptoms - see HPI PMH - Smoking status noted.    Objective Vital Signs reviewed BP 130/72   Pulse 61   Temp 98.8 F (37.1 C) (Oral)   Ht 5\' 10"  (1.778 m)   Wt 183 lb (83 kg)   SpO2 97%   BMI 26.26 kg/m  L thumb - visible catch with flex extension of IP joint.  No soft tissue swelling or redness or weakness L Foot - tender lateral dorsum of mid foot to deep palpation.  No deformity or soft tissue swelling  FROM of all foot joints   Assessments/Plans  See after visit summary for details of patient instuctions  Trigger thumb Try conservative therapy and monitor.    Diabetes mellitus, type II Well controlled   Osteoarthritis I think has a mild flare in L foot from overuse.  Try aspercreme

## 2018-04-08 NOTE — Assessment & Plan Note (Signed)
Well controlled 

## 2018-04-16 ENCOUNTER — Other Ambulatory Visit: Payer: Self-pay | Admitting: Family Medicine

## 2018-04-22 ENCOUNTER — Telehealth: Payer: Self-pay

## 2018-04-22 NOTE — Telephone Encounter (Signed)
Pt called nurse line stating his foot is still really bothering him and causing a lot of pain. Pt stated he has been using tylenol and the cream, however with no relief. Pt is requesting something stronger for pain. Pt denies doing any worse, just hasn't improved. Please advise.

## 2018-04-23 MED ORDER — NAPROXEN 250 MG PO TABS: 250.0000 mg | ORAL_TABLET | Freq: Two times a day (BID) | ORAL | 0 refills | Status: DC | PRN

## 2018-04-23 NOTE — Telephone Encounter (Signed)
Having intermittent pain and mild swelling in L foot.  No trauma.  No pain or swelling in leg.  Able to walk around just sometimes pain.  Tylenol not helping Recommend Naprosyn twice a day as needed  Call back in not better

## 2018-04-23 NOTE — Addendum Note (Signed)
Addended by: Talbert Cage L on: 04/23/2018 03:27 PM   Modules accepted: Orders

## 2018-05-02 ENCOUNTER — Other Ambulatory Visit: Payer: Self-pay | Admitting: Family Medicine

## 2018-06-16 ENCOUNTER — Encounter: Payer: Self-pay | Admitting: Family Medicine

## 2018-06-16 ENCOUNTER — Other Ambulatory Visit: Payer: Self-pay

## 2018-06-16 ENCOUNTER — Ambulatory Visit (INDEPENDENT_AMBULATORY_CARE_PROVIDER_SITE_OTHER): Payer: Medicare HMO | Admitting: Family Medicine

## 2018-06-16 DIAGNOSIS — I10 Essential (primary) hypertension: Secondary | ICD-10-CM

## 2018-06-16 DIAGNOSIS — E782 Mixed hyperlipidemia: Secondary | ICD-10-CM | POA: Diagnosis not present

## 2018-06-16 DIAGNOSIS — M150 Primary generalized (osteo)arthritis: Secondary | ICD-10-CM

## 2018-06-16 DIAGNOSIS — M159 Polyosteoarthritis, unspecified: Secondary | ICD-10-CM

## 2018-06-16 DIAGNOSIS — M8949 Other hypertrophic osteoarthropathy, multiple sites: Secondary | ICD-10-CM

## 2018-06-16 NOTE — Progress Notes (Signed)
Subjective  Arthur [name] is a 78 y.o. male is presenting with the following  L FOOT PAIN Pain over his mid lateral foot.  Describes as intermittent and relatively mild.  No trauma or redness.  Does seem worse after he eats tomatoes?  Uses tylenol for pain rarely.  No fever or weight loss or rash  HYPERTENSION Disease Monitoring  Home BP Monitoring (Severity) not checking Symptoms - Chest pain- no    Dyspnea- no Medications (Modifying factors) Compliance-  Brings in all meds. Lightheadedness-  no  Edema- no Timing - continuous  Duration - years ROS - See HPI  PMH Lab Review   Potassium  Date Value Ref Range Status  05/07/2017 4.6 3.5 - 5.1 mmol/L Final   Sodium  Date Value Ref Range Status  05/07/2017 139 135 - 145 mmol/L Final  01/29/2017 140 134 - 144 mmol/L Final   Creat  Date Value Ref Range Status  11/15/2015 1.18 0.70 - 1.18 mg/dL Final    Comment:      For patients > or = 78 years of age: The upper reference limit for Creatinine is approximately 13% higher for people identified as African-American.      Creatinine, Ser  Date Value Ref Range Status  05/07/2017 1.33 (H) 0.61 - 1.24 mg/dL Final          Chief Complaint noted Review of Symptoms - see HPI PMH - Smoking status noted.    Objective Vital Signs reviewed BP 123/80   Pulse 79   Wt 186 lb 9.6 oz (84.6 kg)   SpO2 95%   BMI 26.77 kg/m  L Foot - mild tenderness without deformity over mid lateral dorsal foot.  FROM of foot and ankle without pain.  Normal skin Heart - Regular rate and rhythm.  No murmurs, gallops or rubs.    Lungs:  Normal respiratory effort, chest expands symmetrically. Lungs are clear to auscultation, no crackles or wheezes. Abdomen: soft and non-tender without masses, organomegaly or hernias noted.  No guarding or rebound   Assessments/Plans  See after visit summary for details of patient instuctions  Essential hypertension, benign Well controlled.  Check labs    Osteoarthritis I think his foot pain is related to mild DJD.  Doubt gout or fracture.   He did not want an xray today.  Will try aspercreme

## 2018-06-16 NOTE — Assessment & Plan Note (Signed)
I think his foot pain is related to mild DJD.  Doubt gout or fracture.   He did not want an xray today.  Will try aspercreme

## 2018-06-16 NOTE — Assessment & Plan Note (Signed)
Well controlled.  Check labs

## 2018-06-16 NOTE — Patient Instructions (Addendum)
Good to see you today!  Thanks for coming in.  We will check blood tests I will send you a message in My Chart  Use the Aspercreme four times a day in the thumb and the foot If not helping enough then send me a message and I will order and Xray  Walk outside every day using a mask  Come back in 3 months

## 2018-06-17 ENCOUNTER — Encounter: Payer: Self-pay | Admitting: Family Medicine

## 2018-06-17 LAB — CMP14+EGFR
ALT: 14 IU/L (ref 0–44)
AST: 18 IU/L (ref 0–40)
Albumin/Globulin Ratio: 1.6 (ref 1.2–2.2)
Albumin: 4.5 g/dL (ref 3.7–4.7)
Alkaline Phosphatase: 79 IU/L (ref 39–117)
BUN/Creatinine Ratio: 14 (ref 10–24)
BUN: 18 mg/dL (ref 8–27)
Bilirubin Total: 0.7 mg/dL (ref 0.0–1.2)
CO2: 24 mmol/L (ref 20–29)
Calcium: 9.9 mg/dL (ref 8.6–10.2)
Chloride: 101 mmol/L (ref 96–106)
Creatinine, Ser: 1.31 mg/dL — ABNORMAL HIGH (ref 0.76–1.27)
GFR calc Af Amer: 60 mL/min/{1.73_m2} (ref >59)
GFR calc non Af Amer: 52 mL/min/{1.73_m2} — ABNORMAL LOW (ref >59)
Globulin, Total: 2.9 g/dL (ref 1.5–4.5)
Glucose: 145 mg/dL — ABNORMAL HIGH (ref 65–99)
Potassium: 4.6 mmol/L (ref 3.5–5.2)
Sodium: 138 mmol/L (ref 134–144)
Total Protein: 7.4 g/dL (ref 6.0–8.5)

## 2018-06-17 LAB — CBC
Hematocrit: 42.5 % (ref 37.5–51.0)
Hemoglobin: 14 g/dL (ref 13.0–17.7)
MCH: 27.7 pg (ref 26.6–33.0)
MCHC: 32.9 g/dL (ref 31.5–35.7)
MCV: 84 fL (ref 79–97)
Platelets: 296 10*3/uL (ref 150–450)
RBC: 5.05 x10E6/uL (ref 4.14–5.80)
RDW: 12.7 % (ref 11.6–15.4)
WBC: 8.8 10*3/uL (ref 3.4–10.8)

## 2018-06-17 LAB — LDL CHOLESTEROL, DIRECT: LDL Direct: 96 mg/dL (ref 0–99)

## 2018-07-06 ENCOUNTER — Other Ambulatory Visit: Payer: Self-pay | Admitting: Family Medicine

## 2018-08-12 ENCOUNTER — Ambulatory Visit (INDEPENDENT_AMBULATORY_CARE_PROVIDER_SITE_OTHER): Payer: Medicare HMO | Admitting: Family Medicine

## 2018-08-12 ENCOUNTER — Encounter: Payer: Self-pay | Admitting: Family Medicine

## 2018-08-12 ENCOUNTER — Other Ambulatory Visit: Payer: Self-pay

## 2018-08-12 VITALS — BP 122/68 | HR 76 | Wt 189.8 lb

## 2018-08-12 DIAGNOSIS — E1169 Type 2 diabetes mellitus with other specified complication: Secondary | ICD-10-CM

## 2018-08-12 DIAGNOSIS — E782 Mixed hyperlipidemia: Secondary | ICD-10-CM

## 2018-08-12 DIAGNOSIS — I10 Essential (primary) hypertension: Secondary | ICD-10-CM | POA: Diagnosis not present

## 2018-08-12 LAB — POCT GLYCOSYLATED HEMOGLOBIN (HGB A1C): HbA1c, POC (controlled diabetic range): 6.3 % (ref 0.0–7.0)

## 2018-08-12 MED ORDER — ATENOLOL 25 MG PO TABS: 25.0000 mg | ORAL_TABLET | Freq: Two times a day (BID) | ORAL | 1 refills | Status: DC

## 2018-08-12 NOTE — Assessment & Plan Note (Signed)
Well controlled 

## 2018-08-12 NOTE — Progress Notes (Signed)
Subjective  Arthur [name] is a 78 y.o. male is presenting with the following  HYPERTENSION Disease Monitoring: Blood pressure range-not checking Chest pain, palpitations- no      Dyspnea- no Medications: Compliance- brings all meds Lightheadedness,Syncope- no   Edema- mild worse at end of day better in AM  DIABETES Disease Monitoring: Blood Sugar ranges-not checking Polyuria/phagia/dipsia- no      Visual problems- no Medications: Compliance- good Hypoglycemic symptoms- no  HYPERLIPIDEMIA Disease Monitoring: See symptoms for Hypertension Medications: Compliance- daily pravastatin Right upper quadrant pain- no  Muscle aches- no  Monitoring Labs and Parameters Last A1C:  Lab Results  Component Value Date   HGBA1C 6.3 08/12/2018    Last Lipid:     Component Value Date/Time   CHOL 145 07/24/2016 1010   HDL 43 07/24/2016 1010   LDLDIRECT 96 06/16/2018 1041    Last Bmet  Potassium  Date Value Ref Range Status  06/16/2018 4.6 3.5 - 5.2 mmol/L Final   Sodium  Date Value Ref Range Status  06/16/2018 138 134 - 144 mmol/L Final   Creat  Date Value Ref Range Status  11/15/2015 1.18 0.70 - 1.18 mg/dL Final    Comment:      For patients > or = 78 years of age: The upper reference limit for Creatinine is approximately 13% higher for people identified as African-American.      Creatinine, Ser  Date Value Ref Range Status  06/16/2018 1.31 (H) 0.76 - 1.27 mg/dL Final      Last BPs:  BP Readings from Last 3 Encounters:  08/12/18 122/68  06/16/18 123/80  04/08/18 130/72    Chief Complaint noted Review of Symptoms - see HPI PMH - Smoking status noted.    Objective Vital Signs reviewed BP 122/68   Pulse 76   Wt 189 lb 12.8 oz (86.1 kg)   SpO2 97%   BMI 27.23 kg/m  Heart - Regular rate and rhythm.  No murmurs, gallops or rubs.    Lungs:  Normal respiratory effort, chest expands symmetrically. Lungs are clear to auscultation, no crackles or  wheezes. Extremities:  No cyanosis, trace edema, or deformity noted with good range of motion of all major joints.   Diabetic Foot Check -  Appearance - no lesions, ulcers or calluses Skin - no unusual pallor or redness extra toe on right Monofilament testing -  Right - Great toe, medial, central, lateral ball and posterior foot intact Left - Great toe, medial, central, lateral ball and posterior foot intact  Assessments/Plans  See after visit summary for details of patient instuctions  Essential hypertension, benign Well controlled will reduce atenolol to 25 mg twice a day   Diabetes mellitus, type II Well controlled    Hyperlipidemia Lab Results  Component Value Date   CHOL 145 07/24/2016   HDL 43 07/24/2016   LDLCALC 69 07/24/2016   LDLDIRECT 96 06/16/2018   TRIG 163 (H) 07/24/2016   CHOLHDL 3.4 07/24/2016   At goal

## 2018-08-12 NOTE — Assessment & Plan Note (Signed)
Lab Results  Component Value Date   CHOL 145 07/24/2016   HDL 43 07/24/2016   LDLCALC 69 07/24/2016   LDLDIRECT 96 06/16/2018   TRIG 163 (H) 07/24/2016   CHOLHDL 3.4 07/24/2016   At goal

## 2018-08-12 NOTE — Patient Instructions (Addendum)
Good to see you today!  Thanks for coming in.  For the leg pain - walk every day about 15-20 minutes - wear support socks when up - sitting and walking  See Dr Posey Pronto and ask him to send [place] a report   Come back 3 months for blood pressure and diabetes check

## 2018-08-12 NOTE — Assessment & Plan Note (Signed)
Well controlled will reduce atenolol to 25 mg twice a day

## 2018-08-20 ENCOUNTER — Other Ambulatory Visit: Payer: Self-pay | Admitting: Family Medicine

## 2018-08-20 DIAGNOSIS — R42 Dizziness and giddiness: Secondary | ICD-10-CM

## 2018-08-25 ENCOUNTER — Other Ambulatory Visit: Payer: Self-pay | Admitting: Family Medicine

## 2018-09-08 ENCOUNTER — Ambulatory Visit
Admission: RE | Admit: 2018-09-08 | Discharge: 2018-09-08 | Disposition: A | Discharge status: Home / Self Care | Payer: Medicare HMO | Source: Ambulatory Visit | Attending: Family Medicine | Admitting: Family Medicine

## 2018-09-08 ENCOUNTER — Encounter: Payer: Self-pay | Admitting: Family Medicine

## 2018-09-08 ENCOUNTER — Other Ambulatory Visit: Payer: Self-pay

## 2018-09-08 ENCOUNTER — Ambulatory Visit (INDEPENDENT_AMBULATORY_CARE_PROVIDER_SITE_OTHER): Payer: Medicare HMO | Admitting: Family Medicine

## 2018-09-08 DIAGNOSIS — R42 Dizziness and giddiness: Secondary | ICD-10-CM | POA: Diagnosis not present

## 2018-09-08 DIAGNOSIS — M549 Dorsalgia, unspecified: Secondary | ICD-10-CM | POA: Insufficient documentation

## 2018-09-08 DIAGNOSIS — G8929 Other chronic pain: Secondary | ICD-10-CM

## 2018-09-08 DIAGNOSIS — M79672 Pain in left foot: Secondary | ICD-10-CM | POA: Insufficient documentation

## 2018-09-08 DIAGNOSIS — M545 Low back pain, unspecified: Secondary | ICD-10-CM | POA: Insufficient documentation

## 2018-09-08 DIAGNOSIS — M19072 Primary osteoarthritis, left ankle and foot: Secondary | ICD-10-CM | POA: Diagnosis not present

## 2018-09-08 MED ORDER — DICLOFENAC SODIUM 1 % TD GEL: TRANSDERMAL | 6 refills | Status: DC

## 2018-09-08 NOTE — Assessment & Plan Note (Signed)
Chronic lower back pain relived with tylenol and dicofinac gel and with no radiation, fever or incontinence. Patient has been more sedentary due to COVID. Needs to work on building strengthen in back muscles with walking to help stabilize spine. Continue Tylenol and Diclofenac gel as needed. Follow up if pain gets worse in severity.

## 2018-09-08 NOTE — Progress Notes (Signed)
Date of Visit: 09/08/2018   CC: left foot swelling  HPI:  Raffaele Remer is a 78 year old male with history of essential HTN, T2DM, hyperlipidemia and osteoarthritis presenting with a 3 day history of first left MTP joint swelling and pain. The pain is non-radiating and is relived with tylenol and diclofenac gel. He is able to bare weight on the feet. No redness, numbness, ulcerations, or itiching at the joint. Denies trauma to the feet and wears shoes when outside. He is concerned that the swelling might be related to what he eats.   Long history of dizziness when waking up in the morning that lasts less than a minute. No dizziness thorough out the day and no orthostatic dizziness. Controlled with meclizine 2 times a day.   Chronic lower back pain that is non-radiating and relived by tylenol and diclofenac gel. No fevers, incontinence or history of trauma. Patient reports less pain during [place] season since he has been not been working, picking up heavy boxes at his job.   Generalized weakness that he thinks could be related to aging. Has not been walking as much as he would like because of COVID.   ROS: See HPI.  [place]: Essential HTN, Hyperlipidemia, T2DM, Osteoarthritis   PHYSICAL EXAM: BP 138/72   Pulse 77   Wt 188 lb 12.8 oz (85.6 kg)   SpO2 97%   BMI 27.09 kg/m  Gen: Well-appearing male not in acute distress HEENT: No cervical LAD. Pupils round and reactive to light. Non eythmatous , non-bulging TM and ear canal.   Heart: S1S2 RRR with out murmurs or gallops. Good capillary refill.  Lungs: CTAB. No wheezing or crackles. Good work of breathing.  Abd: Soft, non-distended abdomen with normoactive bowel sounds.  Ext: Trace lower extremity edema. 2+ pedal pulses. Good sensation and range of motion in right feet. Polydactyly in right feet. First left MTP joint slightly swollen and tender to palpation. No redness or ulceration over the feet. Good range of motion in rest of joints.  Decreased sensation to monofilament prick over left plantar surface of feet on diabetic foot exam.    ASSESSMENT/PLAN:  Left foot pain 3-day history of swelling and tenderness over the 1st left MTP joint. Joint not red, or warm to the touch. Good range of motion in all metatarsophalangeal joints. On physical exam, area tender to palpation and decreased sensation to monofilament prick over entire plantar surface of left feet. Arthritic pain is more likely but gout and joint pain secondary trauma/ infection are still on my differential. To confirm an arteritic etiology, x-ray of the joint has been ordered. Patient should continue to use the diclofenac gel and tylenol for pain as needed. Follow up in 1 weeks if pain is non-resolving and if patients starts to develop fever.   Low back pain Chronic lower back pain relived with tylenol and dicofinac gel and with no radiation, fever or incontinence. Patient has been more sedentary due to COVID. Needs to work on building strengthen in back muscles with walking to help stabilize spine. Continue Tylenol and Diclofenac gel as needed. Follow up if pain gets worse in severity.   Dizziness History of on and off morning dizziness that lasts seconds. Not assodiated with nausea, tinnitus, vertigo or orthostasis. Patient counciled on decreasing the use of meclizine as it can be sedating for older adults. Should instead continue exsersie as it seems to be helping and follow up if problem gets worse.    FOLLOW UP: Follow up in October  to check HgA1C.   Vernice Jefferson MS3, Medical Student

## 2018-09-08 NOTE — Assessment & Plan Note (Signed)
3-day history of swelling and tenderness over the 1st left MTP joint. Joint not red, or warm to the touch. Good range of motion in all metatarsophalangeal joints. On physical exam, area tender to palpation and decreased sensation to monofilament prick over entire plantar surface of left feet. Arthritic pain is more likely but gout and joint pain secondary trauma/ infection are still on my differential. To confirm an arteritic etiology, x-ray of the joint has been ordered. Patient should continue to use the diclofenac gel and tylenol for pain as needed. Follow up in 1 weeks if pain is non-resolving and if patients starts to develop fever.

## 2018-09-08 NOTE — Assessment & Plan Note (Addendum)
History of on and off morning dizziness that lasts seconds. Not assodiated with nausea, tinnitus, vertigo or orthostasis. Patient counciled on decreasing the use of meclizine as it can be sedating for older adults. Should instead continue exsersie as it seems to be helping and follow up if problem gets worse.

## 2018-09-08 NOTE — Patient Instructions (Signed)
Good to see you today!  Thanks for coming in.  I will call you with the results of the xray  Do the neck exercises when you are dizzy  Walk every day to help with your back pain and to build up strength  Come back in 3 months for a diabetes check

## 2018-09-17 IMAGING — CR LEFT FOOT - COMPLETE 3+ VIEW
3 series · 3 of 3 positions shown · non-contrast
Comparison: None.

CLINICAL DATA: Left foot pain without known injury.

EXAM:
LEFT FOOT - COMPLETE 3+ VIEW

[t foot ap left]
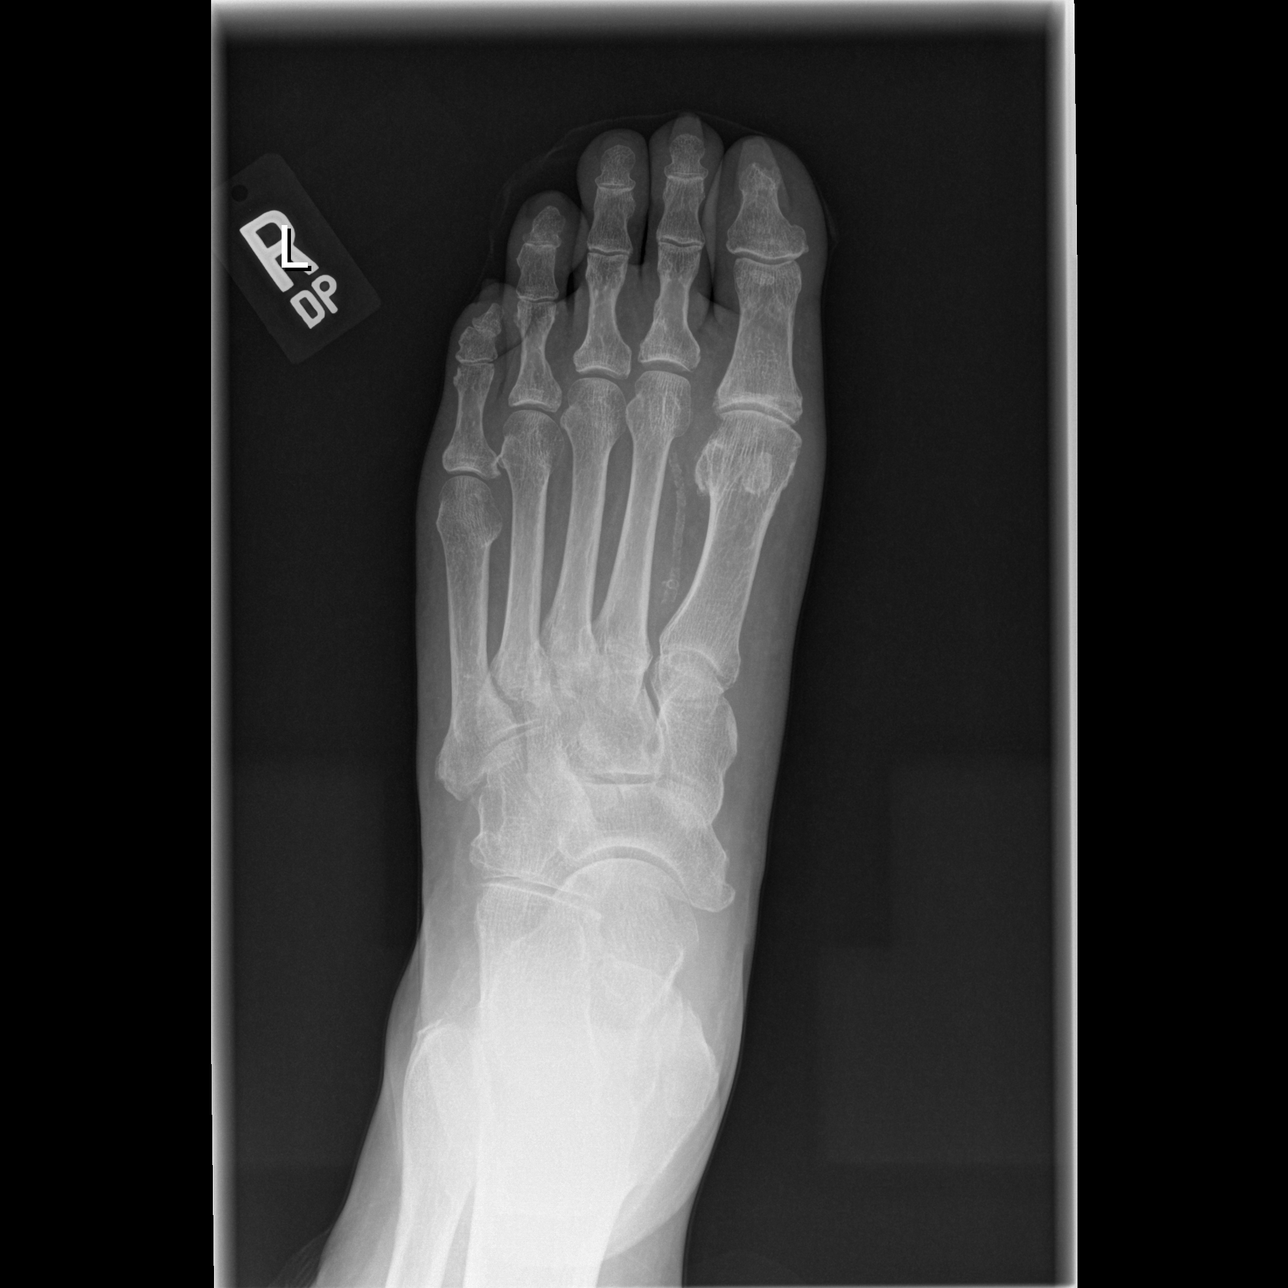

[t foot oblique left]
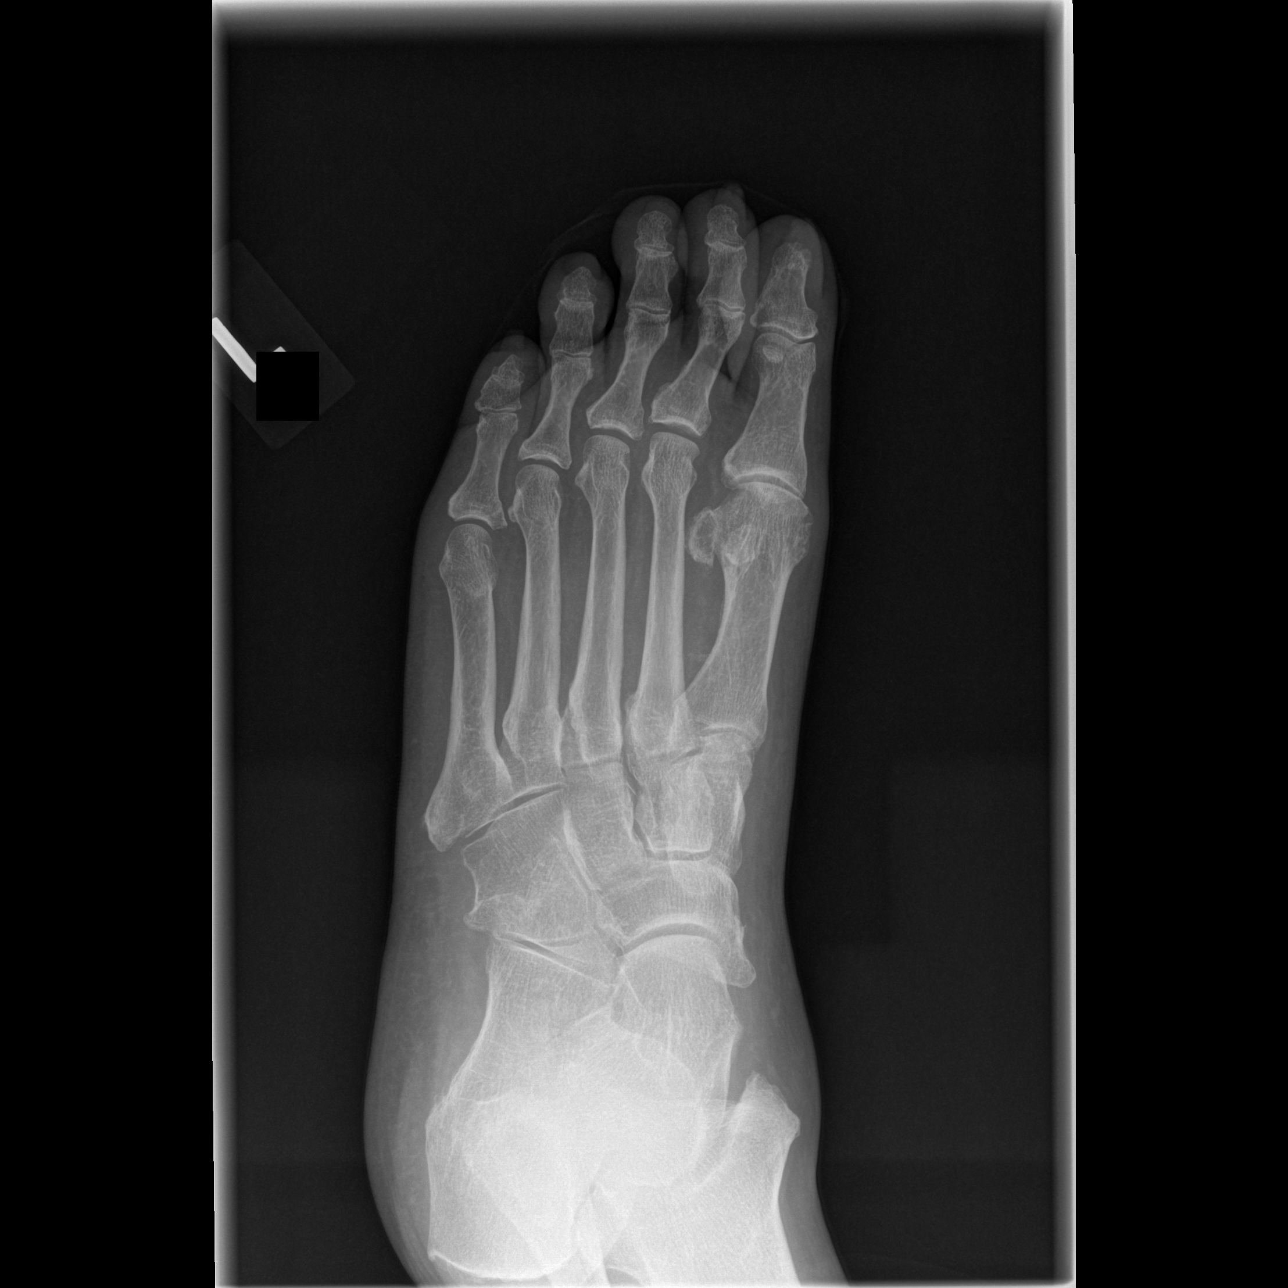

[t foot lat left]
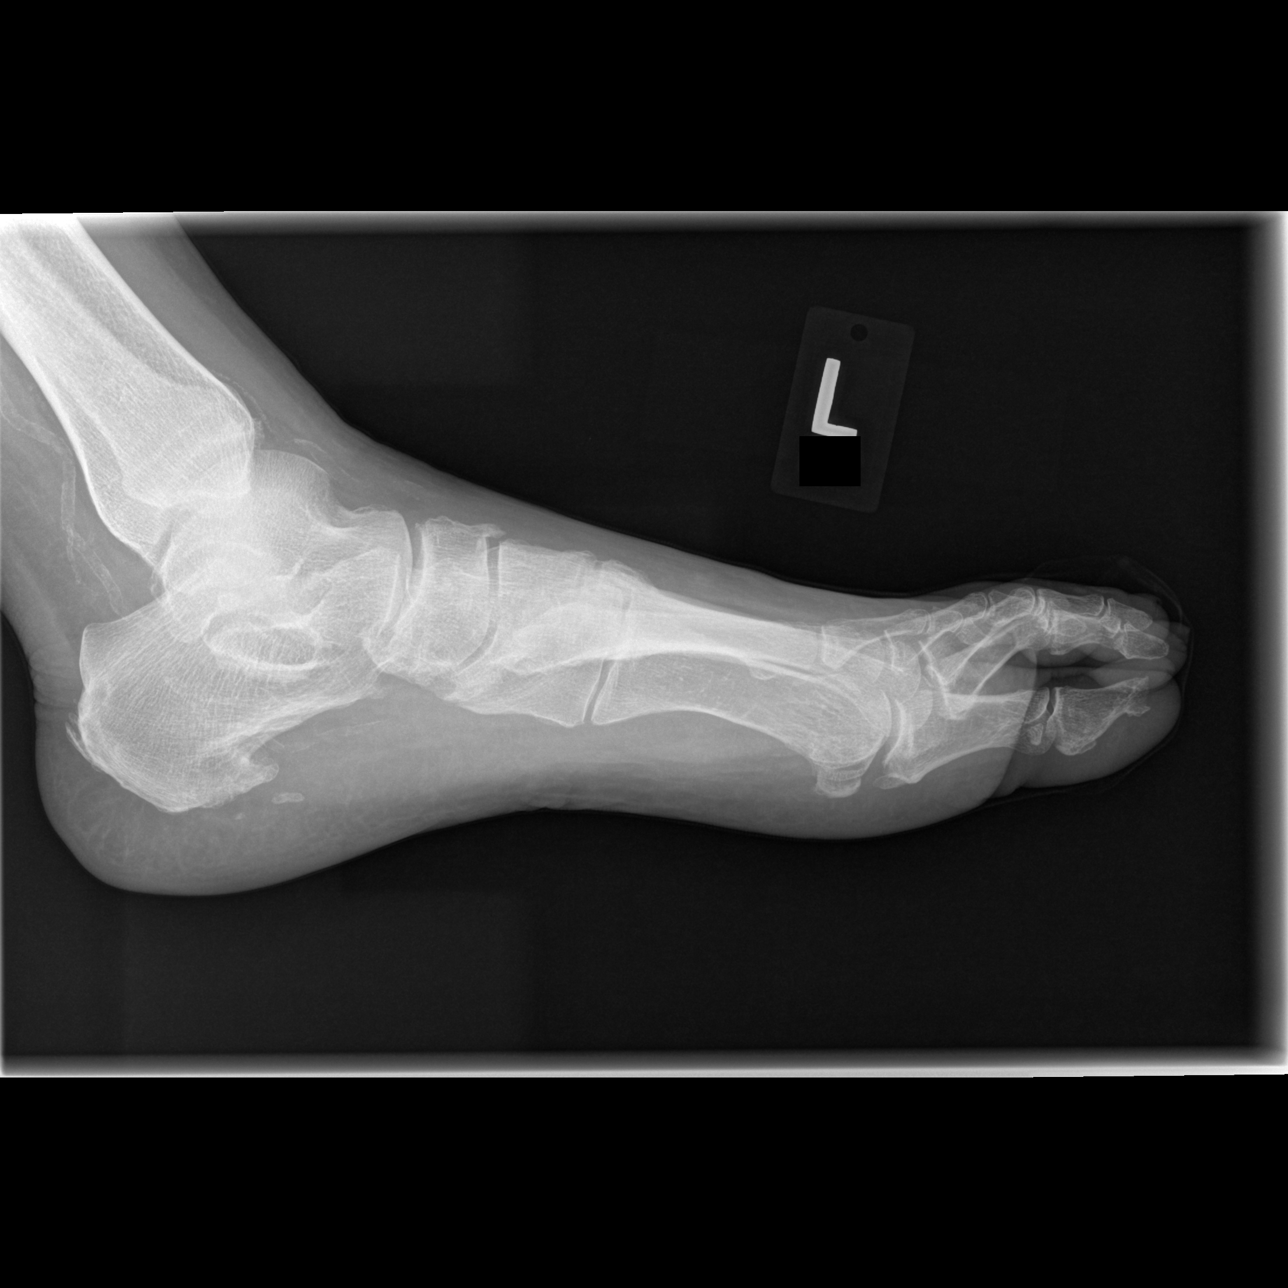

[3 of 3 positions shown; findings below may reference images not displayed]

FINDINGS: There is no evidence of fracture or dislocation. Moderate
degenerative changes seen involving the first metatarsophalangeal
joint. Vascular calcifications are noted.
IMPRESSION: Moderate osteoarthritis of first metatarsophalangeal joint. No acute
abnormality seen in the left foot.

## 2018-10-27 ENCOUNTER — Ambulatory Visit (INDEPENDENT_AMBULATORY_CARE_PROVIDER_SITE_OTHER): Payer: Medicare HMO | Admitting: Family Medicine

## 2018-10-27 ENCOUNTER — Encounter: Payer: Self-pay | Admitting: Family Medicine

## 2018-10-27 ENCOUNTER — Other Ambulatory Visit: Payer: Self-pay

## 2018-10-27 DIAGNOSIS — M79672 Pain in left foot: Secondary | ICD-10-CM | POA: Diagnosis not present

## 2018-10-27 DIAGNOSIS — Z23 Encounter for immunization: Secondary | ICD-10-CM | POA: Diagnosis not present

## 2018-10-27 DIAGNOSIS — M545 Low back pain: Secondary | ICD-10-CM

## 2018-10-27 DIAGNOSIS — I10 Essential (primary) hypertension: Secondary | ICD-10-CM

## 2018-10-27 DIAGNOSIS — G8929 Other chronic pain: Secondary | ICD-10-CM

## 2018-10-27 NOTE — Assessment & Plan Note (Addendum)
Patient has experienced right-sided, intermittent lower back pain for 3-4 days. Pain improves with light massaging and Trolamine cream application. Pain is likely muscular in origin given localized quality and improvement with massage/cream. Radiculopathy also considered but less likely given intermittent quality and brief duration of pain, lack of radiation, and improvement with massage/cream. Advised to continue applying Trolamine as needed and to use tylenol for pain management. Told to contact clinic if pain worsens significantly and impairs function.

## 2018-10-27 NOTE — Assessment & Plan Note (Addendum)
Well-controlled; BP was 128/72 in clinic today. Patient denies dizziness, light-headedness. Will monitor patient for symptoms of orthostatic hypotension and consider reducing medication load if they arise. Patient advised to continue taking medication as prescribed at this time and to continue his exercise (walking) routine.

## 2018-10-27 NOTE — Progress Notes (Signed)
      Subjective  CC: Back Pain and Flu Vaccine  Arthur [name] is a 78 y.o. male who presents today with the following problems:  BACK PAIN  Patient reports intermittent, right-sided lower back pain for 3-4 days. Pain does not radiate. Episodes of pain last a few seconds and patient states the pain is bearable. He states that the pain improves with light massaging and application of Trolamine cream. No weakness or incontinence  HYPERTENSION  Disease Monitoring - not checking at home Chest pain- no; Dyspnea- no Medications: amlodipine, atenolol, HCTZ, Losartan Compliance- good; Lightheadedness- no; Edema- no Duration - many years    DIABETES  Disease Monitoring  Blood Sugar ranges- not checking  Polyuria- noNew Visual problems- no; Medications- Metformin Compliance- good Hypoglycemic symptoms- no   FOOT PAIN Has resolved not bothering him now   ROS See HPI above   PMH Smoking Status noted   Pertinent PMFSHx: none ROS: Pertinent ROS included in HPI.     Objective  Physical Exam:  BP 128/72   Pulse 77   Wt 188 lb 3.2 oz (85.4 kg)   SpO2 97%   BMI 27.00 kg/m   Gen: NAD, resting comfortably CV: RRR with no murmurs appreciated Pulm: NWOB, CTAB with no crackles, wheezes, or rhonchi MSK: no edema, cyanosis, or clubbing noted Neuro: grossly normal, moves all extremities Psych: Normal affect and thought content  Diabetic foot exam: 2+ DP pulses bilat, normal monofilament testing bilaterally. No lesions or significant calluses.    Assessment & Plan    Problem List Items Addressed This Visit      Unprioritized   Essential hypertension, benign    Well-controlled; BP was 128/72 in clinic today. Patient denies dizziness, light-headedness. Will monitor patient for symptoms of orthostatic hypotension and consider reducing medication load if they arise. Patient advised to continue taking medication as prescribed at this time and to continue his exercise (walking)  routine.       Left foot pain    Resolved       Low back pain    Patient has experienced right-sided, intermittent lower back pain for 3-4 days. Pain improves with light massaging and Trolamine cream application. Pain is likely muscular in origin given localized quality and improvement with massage/cream. Radiculopathy also considered but less likely given intermittent quality and brief duration of pain, lack of radiation, and improvement with massage/cream. Advised to continue applying Trolamine as needed and to use tylenol for pain management. Told to contact clinic if pain worsens significantly and impairs function.        Other Visit Diagnoses    Need for immunization against influenza       Relevant Orders   Flu Vaccine QUAD 36+ mos IM (Completed)     Health Maintenance Due  Topic Date Due  . DTaP/Tdap/Td (1 - Tdap) 09/05/1959  . TETANUS/TDAP  09[birth year]05/1959  . OPHTHALMOLOGY EXAM  10/29/2017  . FOOT EXAM  05/28/2018   Health Maintenance discussed with patient and patient agrees to address when able. Patient received flu vaccine today and has an ophthalmology appointment on Friday (10/2) with Dr. Posey Pronto.   I was present during history and exam Lind Covert

## 2018-10-27 NOTE — Patient Instructions (Signed)
Good to see you today!  Thanks for coming in.  For the back pain you can try Tylenol regular up to three times a day   Let me know if getting worse then will get xray  Come back in 3 months

## 2018-10-28 ENCOUNTER — Encounter: Payer: Self-pay | Admitting: Family Medicine

## 2018-10-28 NOTE — Assessment & Plan Note (Signed)
Resolved

## 2018-10-30 DIAGNOSIS — E119 Type 2 diabetes mellitus without complications: Secondary | ICD-10-CM | POA: Diagnosis not present

## 2018-10-30 DIAGNOSIS — H26493 Other secondary cataract, bilateral: Secondary | ICD-10-CM | POA: Diagnosis not present

## 2018-10-30 DIAGNOSIS — H353131 Nonexudative age-related macular degeneration, bilateral, early dry stage: Secondary | ICD-10-CM | POA: Diagnosis not present

## 2018-11-06 ENCOUNTER — Other Ambulatory Visit: Payer: Self-pay | Admitting: Family Medicine

## 2018-11-13 ENCOUNTER — Other Ambulatory Visit: Payer: Self-pay | Admitting: Family Medicine

## 2018-12-02 ENCOUNTER — Other Ambulatory Visit: Payer: Self-pay

## 2018-12-02 ENCOUNTER — Encounter: Payer: Self-pay | Admitting: Family Medicine

## 2018-12-02 ENCOUNTER — Ambulatory Visit (INDEPENDENT_AMBULATORY_CARE_PROVIDER_SITE_OTHER): Payer: Medicare HMO | Admitting: Family Medicine

## 2018-12-02 DIAGNOSIS — R42 Dizziness and giddiness: Secondary | ICD-10-CM | POA: Diagnosis not present

## 2018-12-02 DIAGNOSIS — M8949 Other hypertrophic osteoarthropathy, multiple sites: Secondary | ICD-10-CM

## 2018-12-02 DIAGNOSIS — M159 Polyosteoarthritis, unspecified: Secondary | ICD-10-CM

## 2018-12-02 NOTE — Progress Notes (Signed)
Subjective  Arthur [name] is a 78 y.o. male is presenting with the following  DIZZINESS Brief several seconds of vertigo when first wakes up and moves his head. Thinks is related to cold weather.  No visual changes or hearing loss or falls or weakness or speaking problems.  Takes meclizine sometimes but is not sure if helps.   Never has symptoms during the day. No lightheadness when he stands or walks  FOOT PAIN R great toe hurts sometimes with mild swelling.  He is able to walk without much pain.  Using aspercreme sometimes  Chief Complaint noted Review of Symptoms - see HPI PMH - Smoking status noted.    Objective Vital Signs reviewed BP 120/80   Pulse 78   Wt 190 lb 3.2 oz (86.3 kg)   SpO2 97%   BMI 27.29 kg/m  Neurologic exam : Cn 2-7 intact Strength equal & normal in upper & lower extremities Able to walk on heels and toes.   Balance normal  Romberg normal, finger to nose normal Eye - Pupils Equal Round Reactive to light, Extraocular movements intact, Fundi without hemorrhage or visible lesions, Conjunctiva without redness or discharge TMs - R normal L obscured with wax but appears clear Mobility:able to get up and down from exam table without assistance or distress Foot - mild soft tissue swelling around first MTP mild crepitus. FROM with minimal pain Assessments/Plans  Dizziness Recurrent episodic consistent with  BPPV.  No evidence of CVA or hypotension or ear infection.   Recommend use meclizine only if severe.  Let me know if any change in symptoms   Osteoarthritis His foot pain seems consistent with DJD.  Had before on L and xray indicated this.  Seems to mild to be gout.  See after visit summary for treatment

## 2018-12-02 NOTE — Patient Instructions (Signed)
Good to see you today!  Thanks for coming in.  For Dizzyness - take the meclizine only at night and when really bad  For the arthritis - use the Aspercreme first then tylenol is not helping the can use Naprosyn if really bad  Come back in 3 months

## 2018-12-02 NOTE — Assessment & Plan Note (Signed)
His foot pain seems consistent with DJD.  Had before on L and xray indicated this.  Seems to mild to be gout.  See after visit summary for treatment

## 2018-12-02 NOTE — Assessment & Plan Note (Signed)
Recurrent episodic consistent with  BPPV.  No evidence of CVA or hypotension or ear infection.   Recommend use meclizine only if severe.  Let me know if any change in symptoms

## 2019-01-11 ENCOUNTER — Other Ambulatory Visit: Payer: Self-pay | Admitting: Family Medicine

## 2019-01-12 ENCOUNTER — Telehealth: Payer: Self-pay

## 2019-01-12 NOTE — Telephone Encounter (Signed)
Spoke to pt. Pt states he needs all 8 medications refilled. Pt could not tell me the names of the medications. Pt states that Dr. Erin Hearing sends in his medicaitons every 3 months. Ottis Stain, CMA

## 2019-01-13 ENCOUNTER — Encounter: Payer: Self-pay | Admitting: Family Medicine

## 2019-02-03 ENCOUNTER — Other Ambulatory Visit: Payer: Self-pay | Admitting: Family Medicine

## 2019-02-05 ENCOUNTER — Other Ambulatory Visit: Payer: Self-pay | Admitting: Family Medicine

## 2019-02-10 ENCOUNTER — Ambulatory Visit (INDEPENDENT_AMBULATORY_CARE_PROVIDER_SITE_OTHER): Payer: Medicare HMO | Admitting: Family Medicine

## 2019-02-10 ENCOUNTER — Other Ambulatory Visit: Payer: Self-pay

## 2019-02-10 ENCOUNTER — Encounter: Payer: Self-pay | Admitting: Family Medicine

## 2019-02-10 VITALS — BP 132/62 | HR 74 | Wt 186.6 lb

## 2019-02-10 DIAGNOSIS — E119 Type 2 diabetes mellitus without complications: Secondary | ICD-10-CM

## 2019-02-10 DIAGNOSIS — I10 Essential (primary) hypertension: Secondary | ICD-10-CM

## 2019-02-10 DIAGNOSIS — E782 Mixed hyperlipidemia: Secondary | ICD-10-CM | POA: Diagnosis not present

## 2019-02-10 LAB — POCT GLYCOSYLATED HEMOGLOBIN (HGB A1C): HbA1c, POC (controlled diabetic range): 7.1 % — AB (ref 0.0–7.0)

## 2019-02-10 MED ORDER — PRAVASTATIN SODIUM 40 MG PO TABS: 40.0000 mg | ORAL_TABLET | Freq: Every day | ORAL | 3 refills | Status: DC

## 2019-02-10 MED ORDER — ATENOLOL 25 MG PO TABS: 25.0000 mg | ORAL_TABLET | Freq: Two times a day (BID) | ORAL | 3 refills | Status: DC

## 2019-02-10 MED ORDER — AMLODIPINE BESYLATE 10 MG PO TABS: 10.0000 mg | ORAL_TABLET | Freq: Every day | ORAL | 3 refills | Status: DC

## 2019-02-10 MED ORDER — METFORMIN HCL 1000 MG PO TABS: ORAL_TABLET | ORAL | 3 refills | Status: DC

## 2019-02-10 MED ORDER — HYDROCHLOROTHIAZIDE 25 MG PO TABS: 25.0000 mg | ORAL_TABLET | Freq: Every day | ORAL | 3 refills | Status: DC

## 2019-02-10 MED ORDER — LOSARTAN POTASSIUM 100 MG PO TABS: 100.0000 mg | ORAL_TABLET | Freq: Every day | ORAL | 3 refills | Status: DC

## 2019-02-10 NOTE — Assessment & Plan Note (Signed)
Tolerating treatment well

## 2019-02-10 NOTE — Patient Instructions (Signed)
Good to see you today!  Thanks for coming in.  Keep taking all the medications as your are  Congratulations on the weight loss keep going!  Call Covid vaccination [phone]  If Full we will let you know when vaccines are available here  Come back in 3 months   Stay healthy!

## 2019-02-10 NOTE — Assessment & Plan Note (Signed)
At goal. Continue medications. 

## 2019-02-10 NOTE — Progress Notes (Signed)
Subjective  Arthur [name] is a 79 y.o. male is presenting with the following  HYPERTENSION  Disease Monitoring  Blood pressure range-not checking Chest pain- no      Dyspnea- no Medications  Compliance- brings in all his meds Lightheadedness- no   Edema- no      DIABETES  Disease Monitoring  Blood Sugar ranges-not checking Polyuria- no New Visual problems- no  Medications  Compliance- has all meds Hypoglycemic symptoms- no      HYPERLIPIDEMIA  Disease Monitoring  See symptoms for Hypertension  Medications  Compliance- pravastatin daily RUQ pain- no  Muscle aches- no      Objective Vital Signs reviewed BP 132/62   Pulse 74   Wt 186 lb 9.6 oz (84.6 kg)   SpO2 98%   BMI 26.77 kg/m  Heart - Regular rate and rhythm.  No murmurs, gallops or rubs.    Lungs:  Normal respiratory effort, chest expands symmetrically. Lungs are clear to auscultation, no crackles or wheezes. Mobility:able to get up and down from exam table without assistance or distress Extremities:  No cyanosis, edema, or deformity noted with good range of motion of all major joints.  Assessments/Plans  Essential hypertension, benign At goal   Hyperlipidemia Tolerating treatment well   Diabetes mellitus, type II At goal Continue medications    See after visit summary for details of patient instructions

## 2019-02-10 NOTE — Assessment & Plan Note (Signed)
At goal.  

## 2019-02-24 ENCOUNTER — Other Ambulatory Visit: Payer: Self-pay | Admitting: Family Medicine

## 2019-02-24 ENCOUNTER — Telehealth: Payer: Self-pay

## 2019-02-24 NOTE — Telephone Encounter (Signed)
Called patient and he states that he has used a thick syrup in the past that was red and white in color. He states that he took it 3 times and that his cough was gone. It was very sweet in taste.   The only thing that I could see that he had been given in the past was Dextromethorphan Guaifenesin back in 2016 by Dr. Bonner Puna.   Not sure if that it what he is referring to but he says he would rather try that than Robitussin.     Ozella Almond, [place]

## 2019-02-24 NOTE — Telephone Encounter (Signed)
Patient calls nurse line requesting the "syrup" form of tessalon. Patient stated the syrup form work better for him. Please advise.

## 2019-02-24 NOTE — Telephone Encounter (Signed)
Please let him know we do not have any liquid tessalon.  He can try RobitussinDM otc that is the safest medication for his cough  Thanks

## 2019-03-11 ENCOUNTER — Other Ambulatory Visit: Payer: Self-pay

## 2019-03-11 ENCOUNTER — Encounter: Payer: Self-pay | Admitting: Family Medicine

## 2019-03-11 ENCOUNTER — Ambulatory Visit (INDEPENDENT_AMBULATORY_CARE_PROVIDER_SITE_OTHER): Payer: Medicare HMO | Admitting: Family Medicine

## 2019-03-11 DIAGNOSIS — R058 Other specified cough: Secondary | ICD-10-CM

## 2019-03-11 DIAGNOSIS — M545 Low back pain: Secondary | ICD-10-CM | POA: Diagnosis not present

## 2019-03-11 DIAGNOSIS — G8929 Other chronic pain: Secondary | ICD-10-CM | POA: Diagnosis not present

## 2019-03-11 DIAGNOSIS — R05 Cough: Secondary | ICD-10-CM

## 2019-03-11 NOTE — Progress Notes (Signed)
   CHIEF COMPLAINT / HPI:  COUGH Has been taking tessalon.  Cough is now gone.  No fever or shortness of breath   DJD Has pain in feet and sometimes back that comes and goes.  Wonders what he should take.  Has Rx for naprosyn and robaxin that he brings in    OBJECTIVE: BP 120/72   Pulse 76   Wt 184 lb 3.2 oz (83.6 kg)   SpO2 98%   BMI 26.43 kg/m   Alert no distress Lungs:  Normal respiratory effort, chest expands symmetrically. Lungs are clear to auscultation, no crackles or wheezes. Heart - Regular rate and rhythm.  No murmurs, gallops or rubs.      ASSESSMENT / PLAN:  Low back pain Mild intermittent.  Likely DJD.  Recommend use Tylenol then naproxen as needed.  Suggested not using Robaxin which I kept   Recurrent cough Improved.  Discussed when to use what medications for cough      Lind Covert, MD [place]

## 2019-03-11 NOTE — Patient Instructions (Signed)
Good to see you today!  Thanks for coming in.  If you get fever or bad chills or bad headache after the Covid shot then take Tylenol - two 500 mg tabs once.  You can take again in 8 hours if still feel bad  Glad your cough is better   Take the Naproxen on rarely for joint pain.  Start with the 250mg  first   Benzonate - take as needed for cough.  Take only twice a day  Come back in 2 months for diabetes check

## 2019-03-11 NOTE — Assessment & Plan Note (Signed)
Mild intermittent.  Likely DJD.  Recommend use Tylenol then naproxen as needed.  Suggested not using Robaxin which I kept

## 2019-03-11 NOTE — Assessment & Plan Note (Signed)
Improved.  Discussed when to use what medications for cough

## 2019-04-10 ENCOUNTER — Other Ambulatory Visit: Payer: Self-pay | Admitting: Family Medicine

## 2019-05-11 ENCOUNTER — Ambulatory Visit (INDEPENDENT_AMBULATORY_CARE_PROVIDER_SITE_OTHER): Payer: Medicare HMO | Admitting: Family Medicine

## 2019-05-11 ENCOUNTER — Other Ambulatory Visit: Payer: Self-pay

## 2019-05-11 VITALS — BP 125/70 | HR 72 | Ht 70.0 in | Wt 183.2 lb

## 2019-05-11 DIAGNOSIS — R05 Cough: Secondary | ICD-10-CM

## 2019-05-11 DIAGNOSIS — I10 Essential (primary) hypertension: Secondary | ICD-10-CM | POA: Diagnosis not present

## 2019-05-11 DIAGNOSIS — E1169 Type 2 diabetes mellitus with other specified complication: Secondary | ICD-10-CM

## 2019-05-11 DIAGNOSIS — R058 Other specified cough: Secondary | ICD-10-CM

## 2019-05-11 LAB — POCT GLYCOSYLATED HEMOGLOBIN (HGB A1C): HbA1c, POC (controlled diabetic range): 7.3 % — AB (ref 0.0–7.0)

## 2019-05-11 NOTE — Patient Instructions (Signed)
Good to see you today!  Thanks for coming in.  Keep taking all the medications the same  Try to lose another 3-4 lbs of weight by eating less  Come back in 3 months and we will check blood tests

## 2019-05-11 NOTE — Assessment & Plan Note (Signed)
No coughing now

## 2019-05-11 NOTE — Progress Notes (Signed)
    SUBJECTIVE:   CHIEF COMPLAINT / HPI:   HYPERTENSION  Disease Monitoring  Blood pressure range-not checking Chest pain- no      Dyspnea- no Medications  Compliance- brings all meds Lightheadedness- no   Edema- no      DIABETES  Disease Monitoring  Blood Sugar ranges-not checking Polyuria- no New Visual problems- no  Medications  Compliance- daily Hypoglycemic symptoms- no    ROS See HPI above   PMH Smoking Status noted    PERTINENT  PMH / PSH: history of PE in past  OBJECTIVE:   BP 125/70   Pulse 72   Ht 5\' 10"  (1.778 m)   Wt 183 lb 3.2 oz (83.1 kg)   SpO2 96%   BMI 26.29 kg/m   Heart - Regular rate and rhythm.  No murmurs, gallops or rubs.    Lungs:  Normal respiratory effort, chest expands symmetrically. Lungs are clear to auscultation, no crackles or wheezes. Abdomen: soft and non-tender without masses, organomegaly or hernias noted.  No guarding or rebound Extremities:  No cyanosis, edema, or deformity noted with good range of motion of all major joints.     ASSESSMENT/PLAN:   No problem-specific Assessment & Plan notes found for this encounter.     Lind Covert, MD [place]

## 2019-05-11 NOTE — Assessment & Plan Note (Signed)
Stable continue his current 3 drugs for hypertension

## 2019-05-11 NOTE — Assessment & Plan Note (Signed)
Not at goal.  Encouraged continued weight loss

## 2019-05-17 ENCOUNTER — Other Ambulatory Visit: Payer: Self-pay | Admitting: Family Medicine

## 2019-07-07 ENCOUNTER — Ambulatory Visit (INDEPENDENT_AMBULATORY_CARE_PROVIDER_SITE_OTHER): Payer: Medicare HMO | Admitting: Family Medicine

## 2019-07-07 ENCOUNTER — Encounter: Payer: Self-pay | Admitting: Family Medicine

## 2019-07-07 ENCOUNTER — Other Ambulatory Visit: Payer: Self-pay

## 2019-07-07 DIAGNOSIS — M25561 Pain in right knee: Secondary | ICD-10-CM

## 2019-07-07 MED ORDER — NAPROXEN 250 MG PO TABS: 250.0000 mg | ORAL_TABLET | Freq: Two times a day (BID) | ORAL | 1 refills | Status: DC | PRN

## 2019-07-07 NOTE — Progress Notes (Signed)
    SUBJECTIVE:   CHIEF COMPLAINT / HPI:   R KNEE PAIN On and off for last week.  When he walks a lot for hours a day his R knee becomes swollen and sore.  Goes back to normal when does not walk a lot.  Using aspercreme and sometimes tylenol.   No trauma or fever.  No other joints hurt.  He had arthroscopy in that knee about 15 years ago  PERTINENT  PMH / PSH: Dm  OBJECTIVE:   BP 120/70   Pulse 73   Ht 5\' 11"  (1.803 m)   Wt 181 lb 6.4 oz (82.3 kg)   SpO2 96%   BMI 25.30 kg/m   R Knee - FROM with significant crepitus but no pain.  No pain in R hip or ankle  Mild effusion No laxity  L knee - no effusion significant crepitus   ASSESSMENT/PLAN:   Right knee pain Consistent with  DJD flare.  He has a lot of crepitus but good range of motion and no pain today.  See after visit summary for plan to take analgesics.  Discussed long term plans if worsens a lot      Lind Covert, MD [place]

## 2019-07-07 NOTE — Patient Instructions (Addendum)
Good to see you today!  Thanks for coming in.  For the Knee  If no pain do not taking anything  For mild pain - Aspercreme rub  For more pain - Tylenol and Aspercreme  If a lot of pain - Naprosyn and Tylenol and Aspercreme  If is interfering with your life a lot we can consider an injection  Come back in August for a diabetes check

## 2019-07-07 NOTE — Assessment & Plan Note (Signed)
Consistent with  DJD flare.  He has a lot of crepitus but good range of motion and no pain today.  See after visit summary for plan to take analgesics.  Discussed long term plans if worsens a lot

## 2019-09-08 ENCOUNTER — Other Ambulatory Visit: Payer: Self-pay

## 2019-09-08 ENCOUNTER — Encounter: Payer: Self-pay | Admitting: Family Medicine

## 2019-09-08 ENCOUNTER — Ambulatory Visit (INDEPENDENT_AMBULATORY_CARE_PROVIDER_SITE_OTHER): Payer: Medicare HMO | Admitting: Family Medicine

## 2019-09-08 VITALS — BP 110/70 | HR 72 | Wt 184.2 lb

## 2019-09-08 DIAGNOSIS — E1169 Type 2 diabetes mellitus with other specified complication: Secondary | ICD-10-CM | POA: Diagnosis not present

## 2019-09-08 DIAGNOSIS — I10 Essential (primary) hypertension: Secondary | ICD-10-CM

## 2019-09-08 DIAGNOSIS — E6609 Other obesity due to excess calories: Secondary | ICD-10-CM

## 2019-09-08 DIAGNOSIS — E782 Mixed hyperlipidemia: Secondary | ICD-10-CM

## 2019-09-08 LAB — POCT GLYCOSYLATED HEMOGLOBIN (HGB A1C): HbA1c, POC (controlled diabetic range): 6.9 % (ref 0.0–7.0)

## 2019-09-08 NOTE — Assessment & Plan Note (Signed)
Good control continue current medications

## 2019-09-08 NOTE — Assessment & Plan Note (Signed)
No symptoms.  Will check lipids

## 2019-09-08 NOTE — Assessment & Plan Note (Signed)
Good control without any symptoms.  Continue current medications

## 2019-09-08 NOTE — Assessment & Plan Note (Signed)
Discussed weight gain and would be healthier to weigh less

## 2019-09-08 NOTE — Progress Notes (Signed)
    SUBJECTIVE:   CHIEF COMPLAINT / HPI:   HYPERTENSION Blood pressure range-not checking Chest pain, palpitations- no       Medications Adherence- brings in all meds Lightheadedness,Syncope- no   Edema- no  DIABETES Blood Sugar ranges-not checking  Medication Adherence good Hypoglycemic symptoms- no  HYPERLIPIDEMIA Medication Adherence pravastatin dialy Right upper quadrant pain- no  Severe Muscle aches- no  PERTINENT  PMH / PSH: still working part time  OBJECTIVE:   BP 110/70   Pulse 72   Wt 184 lb 3.2 oz (83.6 kg)   SpO2 96%   BMI 25.69 kg/m   Heart - Regular rate and rhythm.  No murmurs, gallops or rubs.    Lungs:  Normal respiratory effort, chest expands symmetrically. Lungs are clear to auscultation, no crackles or wheezes. Extremities:  No cyanosis, edema, or deformity noted with good range of motion of all major joints.     ASSESSMENT/PLAN:   Essential hypertension, benign Good control without any symptoms.  Continue current medications   Diabetes mellitus, type II Good control continue current medications   Hyperlipidemia No symptoms.  Will check lipids   OBESITY, UNSPECIFIED Discussed weight gain and would be healthier to weigh less      Lind Covert, [place]

## 2019-09-08 NOTE — Patient Instructions (Signed)
Good to see you today - Thanks for coming in  Keep taking all the medication as you are  Please always bring your medication bottles  I will call you if your tests are not good.  Otherwise I will send you a letter.  If you do not hear from me with in 2 weeks please call our office.      Come back to see me in 3 months

## 2019-09-09 LAB — CMP14+EGFR
ALT: 12 IU/L (ref 0–44)
AST: 15 IU/L (ref 0–40)
Albumin/Globulin Ratio: 1.2 (ref 1.2–2.2)
Albumin: 3.8 g/dL (ref 3.7–4.7)
Alkaline Phosphatase: 75 IU/L (ref 48–121)
BUN/Creatinine Ratio: 18 (ref 10–24)
BUN: 24 mg/dL (ref 8–27)
Bilirubin Total: 0.5 mg/dL (ref 0.0–1.2)
CO2: 21 mmol/L (ref 20–29)
Calcium: 9.7 mg/dL (ref 8.6–10.2)
Chloride: 102 mmol/L (ref 96–106)
Creatinine, Ser: 1.34 mg/dL — ABNORMAL HIGH (ref 0.76–1.27)
GFR calc Af Amer: 58 mL/min/{1.73_m2} — ABNORMAL LOW (ref >59)
GFR calc non Af Amer: 50 mL/min/{1.73_m2} — ABNORMAL LOW (ref >59)
Globulin, Total: 3.1 g/dL (ref 1.5–4.5)
Glucose: 145 mg/dL — ABNORMAL HIGH (ref 65–99)
Potassium: 4.7 mmol/L (ref 3.5–5.2)
Sodium: 136 mmol/L (ref 134–144)
Total Protein: 6.9 g/dL (ref 6.0–8.5)

## 2019-09-09 LAB — LIPID PANEL
Chol/HDL Ratio: 3.8 ratio (ref 0.0–5.0)
Cholesterol, Total: 157 mg/dL (ref 100–199)
HDL: 41 mg/dL (ref >39)
LDL Chol Calc (NIH): 82 mg/dL (ref 0–99)
Triglycerides: 199 mg/dL — ABNORMAL HIGH (ref 0–149)
VLDL Cholesterol Cal: 34 mg/dL (ref 5–40)

## 2019-10-27 ENCOUNTER — Other Ambulatory Visit: Payer: Self-pay | Admitting: Family Medicine

## 2019-11-02 DIAGNOSIS — E119 Type 2 diabetes mellitus without complications: Secondary | ICD-10-CM | POA: Diagnosis not present

## 2019-11-02 DIAGNOSIS — H353131 Nonexudative age-related macular degeneration, bilateral, early dry stage: Secondary | ICD-10-CM | POA: Diagnosis not present

## 2019-11-02 DIAGNOSIS — H26493 Other secondary cataract, bilateral: Secondary | ICD-10-CM | POA: Diagnosis not present

## 2019-11-02 LAB — HM DIABETES EYE EXAM

## 2019-11-03 ENCOUNTER — Ambulatory Visit (INDEPENDENT_AMBULATORY_CARE_PROVIDER_SITE_OTHER): Payer: Medicare HMO | Admitting: Family Medicine

## 2019-11-03 ENCOUNTER — Other Ambulatory Visit: Payer: Self-pay

## 2019-11-03 VITALS — BP 120/70 | HR 71 | Ht 71.0 in | Wt 179.0 lb

## 2019-11-03 DIAGNOSIS — Z23 Encounter for immunization: Secondary | ICD-10-CM

## 2019-11-03 DIAGNOSIS — M25572 Pain in left ankle and joints of left foot: Secondary | ICD-10-CM

## 2019-11-03 NOTE — Progress Notes (Signed)
    SUBJECTIVE:   CHIEF COMPLAINT / HPI:   L ANKLE PAIN Tender over insertion of achilles tendon where he struck it at work.  Hurst him only when he massages it.  Able to walk without problems.  No redness or swelling  PERTINENT  PMH / PSH: brings all his medications which are accurate  OBJECTIVE:   BP 120/70   Pulse 71   Ht 5\' 11"  (1.803 m)   Wt 179 lb (81.2 kg)   SpO2 95%   BMI 24.97 kg/m   L Ankle - mildly tender over achilles insertion.   No bruising or soft tissue swelling.  Good strength with ankle flexion extension without pain.  Normal gait  Normal thompson test   ASSESSMENT/PLAN:   No problem-specific Assessment & Plan notes found for this encounter.   L Ankle pain - soft tissue trauma without joint or tendon dysfunction.  Local measures See after visit summary    Lind Covert, [place]

## 2019-11-03 NOTE — Patient Instructions (Addendum)
Good to see you today!  Thanks for coming in.  Your heel should be better in 2 weeks  If you have any swelling or pain in your calves back of leg then let me know right away  Always get up and walk every 2 hours when traveling  Come back in one month for an A1c and we can give your Covid booster then

## 2019-11-10 ENCOUNTER — Ambulatory Visit: Payer: Medicare HMO | Admitting: Family Medicine

## 2019-11-17 ENCOUNTER — Encounter: Payer: Self-pay | Admitting: Family Medicine

## 2019-11-17 ENCOUNTER — Ambulatory Visit (INDEPENDENT_AMBULATORY_CARE_PROVIDER_SITE_OTHER): Payer: Medicare Other | Admitting: Family Medicine

## 2019-11-17 ENCOUNTER — Other Ambulatory Visit: Payer: Self-pay

## 2019-11-17 VITALS — BP 120/72 | HR 70 | Ht 71.0 in | Wt 176.8 lb

## 2019-11-17 DIAGNOSIS — M159 Polyosteoarthritis, unspecified: Secondary | ICD-10-CM

## 2019-11-17 DIAGNOSIS — M8949 Other hypertrophic osteoarthropathy, multiple sites: Secondary | ICD-10-CM | POA: Diagnosis not present

## 2019-11-17 DIAGNOSIS — E1169 Type 2 diabetes mellitus with other specified complication: Secondary | ICD-10-CM

## 2019-11-17 DIAGNOSIS — Z23 Encounter for immunization: Secondary | ICD-10-CM

## 2019-11-17 LAB — POCT GLYCOSYLATED HEMOGLOBIN (HGB A1C): Hemoglobin A1C: 6.9 % — AB (ref 4.0–5.6)

## 2019-11-17 NOTE — Assessment & Plan Note (Signed)
Foot pain likely due to DJD and prolonged standing.  Recommend arch supports - showed him picture

## 2019-11-17 NOTE — Patient Instructions (Addendum)
Good to see you today!  Thanks for coming in.  For the foot pain - get some insole arch support from drug store or [employer].  The Super sport arch support  Take naproxen as needed but not every day

## 2019-11-17 NOTE — Progress Notes (Signed)
    SUBJECTIVE:   CHIEF COMPLAINT / HPI:   Here for Covid booster shot other wise feels well except for pain in his feet when standing for along time.  His foot swelling has resolved  PERTINENT  PMH / PSH:   OBJECTIVE:   BP 120/72   Pulse 70   Ht 5\' 11"  (1.803 m)   Wt 176 lb 12.8 oz (80.2 kg)   SpO2 96%   BMI 24.66 kg/m   Good spirtis   ASSESSMENT/PLAN:   Osteoarthritis Foot pain likely due to DJD and prolonged standing.  Recommend arch supports - showed him picture      Lind Covert, [place]

## 2019-12-01 ENCOUNTER — Other Ambulatory Visit: Payer: Medicare HMO

## 2019-12-25 ENCOUNTER — Other Ambulatory Visit: Payer: Self-pay | Admitting: Family Medicine

## 2020-01-09 ENCOUNTER — Other Ambulatory Visit: Payer: Self-pay | Admitting: Family Medicine

## 2020-02-01 ENCOUNTER — Other Ambulatory Visit: Payer: Self-pay

## 2020-02-01 ENCOUNTER — Encounter: Payer: Self-pay | Admitting: Family Medicine

## 2020-02-01 ENCOUNTER — Ambulatory Visit (INDEPENDENT_AMBULATORY_CARE_PROVIDER_SITE_OTHER): Payer: Medicare HMO | Admitting: Family Medicine

## 2020-02-01 VITALS — BP 118/74 | HR 70 | Wt 177.0 lb

## 2020-02-01 DIAGNOSIS — E782 Mixed hyperlipidemia: Secondary | ICD-10-CM | POA: Diagnosis not present

## 2020-02-01 DIAGNOSIS — E1169 Type 2 diabetes mellitus with other specified complication: Secondary | ICD-10-CM | POA: Diagnosis not present

## 2020-02-01 DIAGNOSIS — I10 Essential (primary) hypertension: Secondary | ICD-10-CM

## 2020-02-01 LAB — POCT GLYCOSYLATED HEMOGLOBIN (HGB A1C): Hemoglobin A1C: 6.7 % — AB (ref 4.0–5.6)

## 2020-02-01 NOTE — Assessment & Plan Note (Signed)
Well controlled on metformin alone

## 2020-02-01 NOTE — Assessment & Plan Note (Signed)
At goal.  Labs good.  Continue current medications

## 2020-02-01 NOTE — Assessment & Plan Note (Signed)
Lab Results  Component Value Date   LDLCALC 82 09/08/2019   Well controlled

## 2020-02-01 NOTE — Progress Notes (Signed)
    SUBJECTIVE:   CHIEF COMPLAINT / HPI:   HYPERTENSION Blood pressure range-not checking at home Chest pain, palpitations- no       Medications Adherence- brings in all his meds Lightheadedness,Syncope- no   Edema- no  DIABETES Blood Sugar ranges-not checking  Medication Adherence good Hypoglycemic symptoms- no  HYPERLIPIDEMIA Medication Adherence good Right upper quadrant pain- no  Severe Muscle aches- no   PERTINENT  PMH / PSH: son would like to be my patient.  He has completely retired.     OBJECTIVE:   BP 118/74   Pulse 70   Wt 177 lb (80.3 kg)   SpO2 95%   BMI 24.69 kg/m   Heart - Regular rate and rhythm.  No murmurs, gallops or rubs.    Lungs:  Normal respiratory effort, chest expands symmetrically. Lungs are clear to auscultation, no crackles or wheezes. Extremities:  No cyanosis, edema, or deformity noted with good range of motion of all major joints.     ASSESSMENT/PLAN:   Essential hypertension, benign At goal.  Labs good.  Continue current medications   Diabetes mellitus, type II Well controlled on metformin alone  Hyperlipidemia Lab Results  Component Value Date   LDLCALC 82 09/08/2019   Well controlled      Lind Covert, MD [place]

## 2020-02-01 NOTE — Patient Instructions (Signed)
Good to see you today!  Thanks for coming in.  Keep taking all the medications as you are  Get some weights and exercise lifting to improve your muscles.  Walk every day  Ask the ladies in the front for forms for your son and daughter in law  Come back in 3-6  months for a diabetes check

## 2020-02-10 DIAGNOSIS — Z20828 Contact with and (suspected) exposure to other viral communicable diseases: Secondary | ICD-10-CM | POA: Diagnosis not present

## 2020-02-10 NOTE — Progress Notes (Signed)
    SUBJECTIVE:   CHIEF COMPLAINT / HPI:    Left soulder pain: Patient describes pain more in the left trapezius muscle.  Does not radiate up his neck or down his shoulder.  It has been gone going for 2 weeks.  Has some decreased range of motion when turning to the left.  Right calf pain: Patient has been experiencing pain in the proximal right calf for the past 2 weeks.  Started around the same time as his neck pain.  Has not noticed any swelling or bruising in the leg.  No pain in the knee.  He has been putting ice on it.  He takes Tylenol and Advil which make the pain better.  He also has tried using icy hot.  Patient had a history of DVT with PE but started in his leg a few years ago.  Was on blood thinner for 1 year but not currently.  Cough: Patient complaining of a cough that he gets this time every year.  Not productive.  No fevers.  No difficulty breathing.  Gets a cough every hyear this time of year.  He had his COVID booster shot in October.  PERTINENT  PMH / PSH: PE, pneumonia  OBJECTIVE:   BP 132/60   Pulse 77   Ht 5\' 11"  (1.803 m)   Wt 172 lb 6.4 oz (78.2 kg)   SpO2 94%   BMI 24.04 kg/m   General: Alert, oriented elderly male.  Appears younger than stated age.  Accompanied by his son.  No acute distress. CV: Regular rate and rhythm, no murmurs Pulmonary: Rhonchi appreciated bilaterally but worse in the right lower lobe. MSK: Reduced range of motion in the neck when laterally rotating to the left.  Mild tenderness to palpation in the left trapezius.  No tenderness to palpation over the cervical spinal processes. Tenderness to palpation over the right posterior lateral calf proximally, just distal to the fibular head.  No bony tenderness.  No swelling of the calf or popliteal area.  ASSESSMENT/PLAN:   Right calf pain Most likely due to strain of the lateral head of the gastrocnemius muscle based on physical exam.  Although without a reason for the pain (injury or overuse)  along with the patient's somewhat recent history of DVT with PE we will get a D-dimer.  If that is elevated will get DVT ultrasound.  Patient advised to seek medical attention if he has difficulty breathing or chest pain.  Cough [name] cough is seasonal per his history and therefore most likely related to allergies, but with patient's rhonchi on exam we will get chest x-ray to rule out infectious cause.  If negative will prescribe second GEN antihistamine  Trapezius muscle spasm Mild.  Patient states he has had these issues in the past.  Does not appear to be related to vertebral disease based on his physical exam.  Unsure what triggered it.  Advised patient to continue the medications he is using and apply heat if needed.     Benay Pike, MD [place]

## 2020-02-11 ENCOUNTER — Ambulatory Visit (HOSPITAL_COMMUNITY): Payer: Medicare HMO

## 2020-02-11 ENCOUNTER — Telehealth: Payer: Self-pay

## 2020-02-11 ENCOUNTER — Other Ambulatory Visit: Payer: Self-pay

## 2020-02-11 ENCOUNTER — Encounter: Payer: Self-pay | Admitting: Family Medicine

## 2020-02-11 ENCOUNTER — Ambulatory Visit (INDEPENDENT_AMBULATORY_CARE_PROVIDER_SITE_OTHER): Payer: Medicare HMO | Admitting: Family Medicine

## 2020-02-11 ENCOUNTER — Ambulatory Visit
Admission: RE | Admit: 2020-02-11 | Discharge: 2020-02-11 | Disposition: A | Discharge status: Home / Self Care | Payer: Medicare HMO | Source: Ambulatory Visit | Attending: Family Medicine | Admitting: Family Medicine

## 2020-02-11 VITALS — BP 132/60 | HR 77 | Ht 71.0 in | Wt 172.4 lb

## 2020-02-11 DIAGNOSIS — M62838 Other muscle spasm: Secondary | ICD-10-CM

## 2020-02-11 DIAGNOSIS — M79661 Pain in right lower leg: Secondary | ICD-10-CM | POA: Diagnosis not present

## 2020-02-11 DIAGNOSIS — R059 Cough, unspecified: Secondary | ICD-10-CM | POA: Diagnosis not present

## 2020-02-11 LAB — D-DIMER, QUANTITATIVE: D-DIMER: 0.55 mg/L FEU — ABNORMAL HIGH (ref 0.00–0.49)

## 2020-02-11 IMAGING — CR DG CHEST 2V
2 series · 2 of 2 positions shown · non-contrast
Comparison: [DATE] chest radiograph.

CLINICAL DATA: Nonproductive cough for 1 day

EXAM:
CHEST - 2 VIEW

[w chest pa]
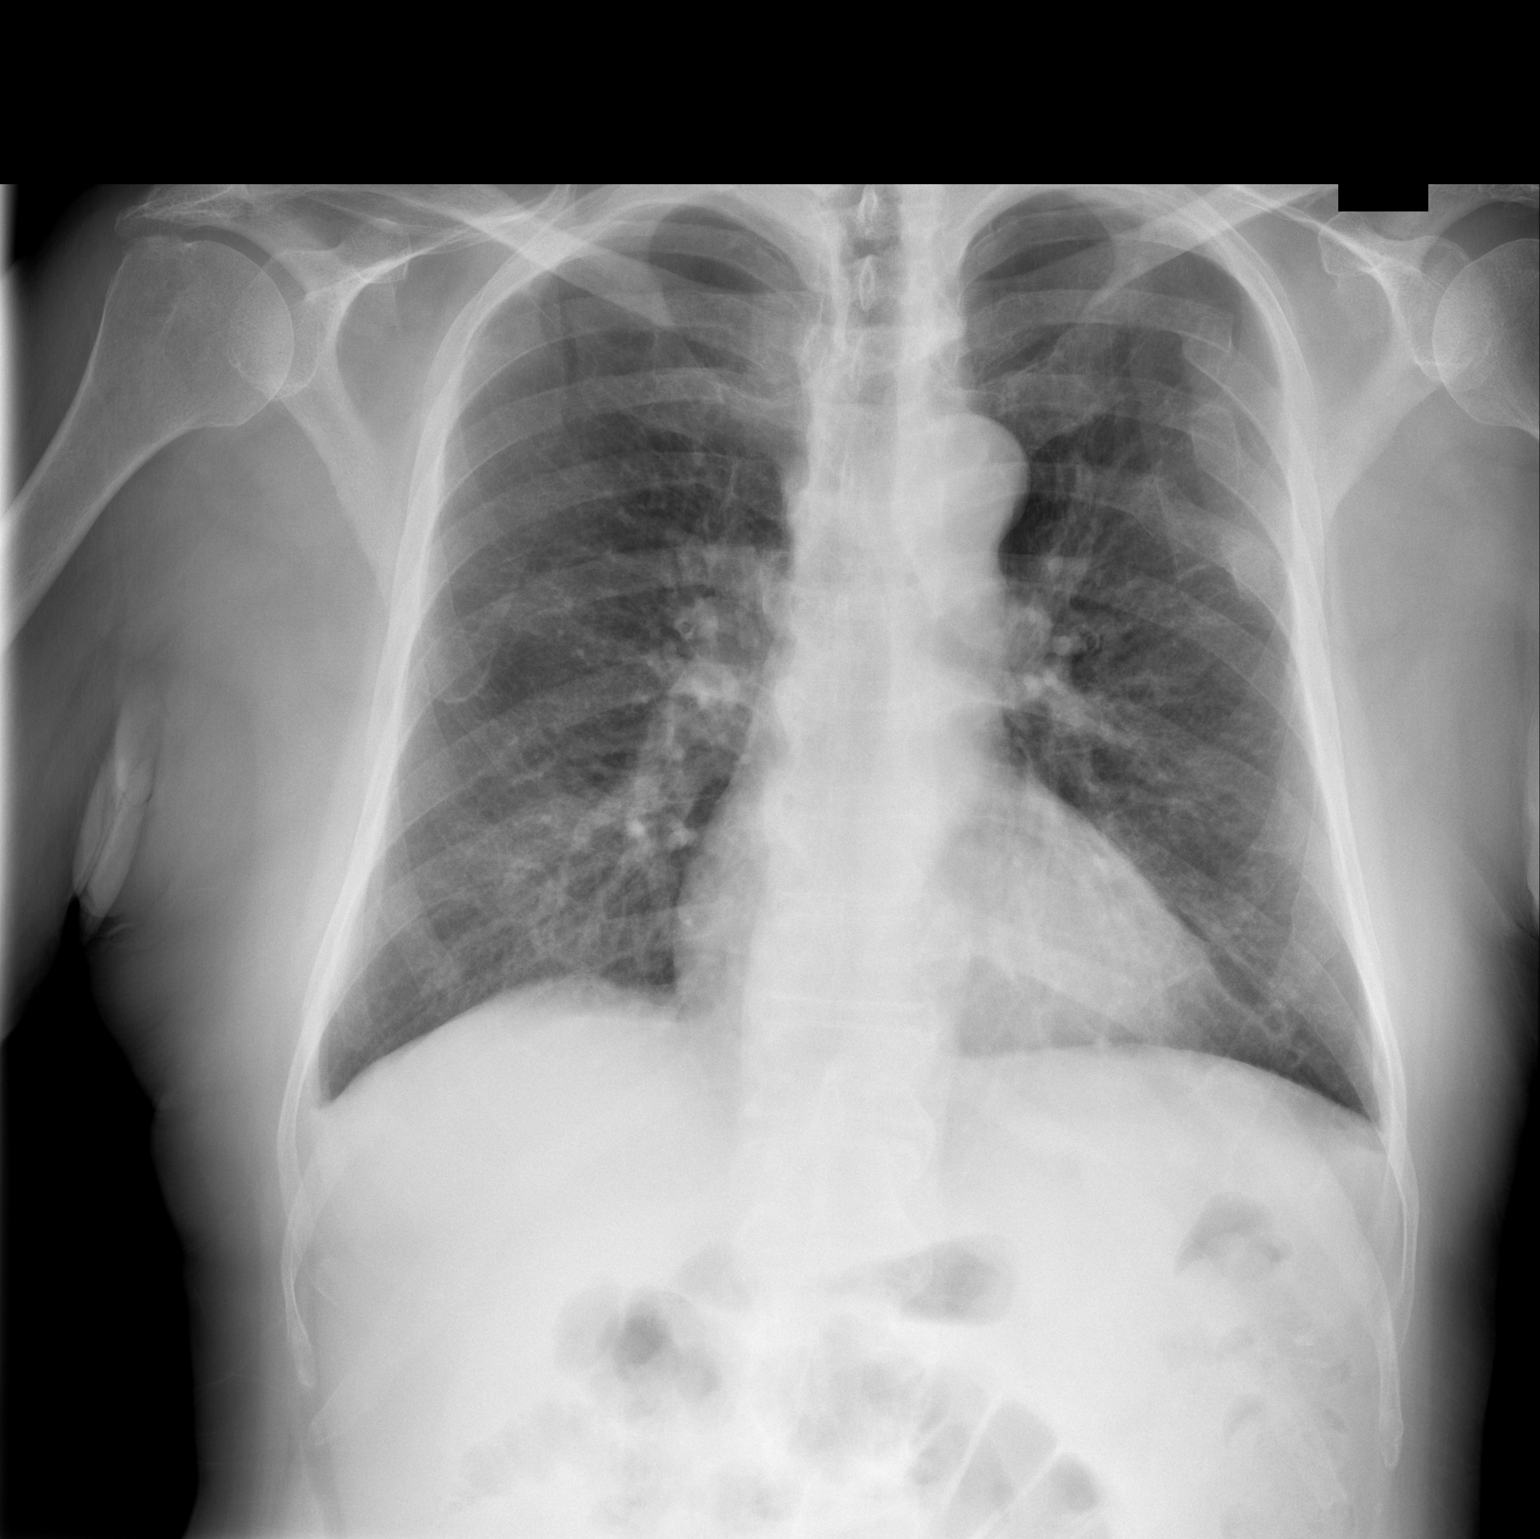

[w chest lat]
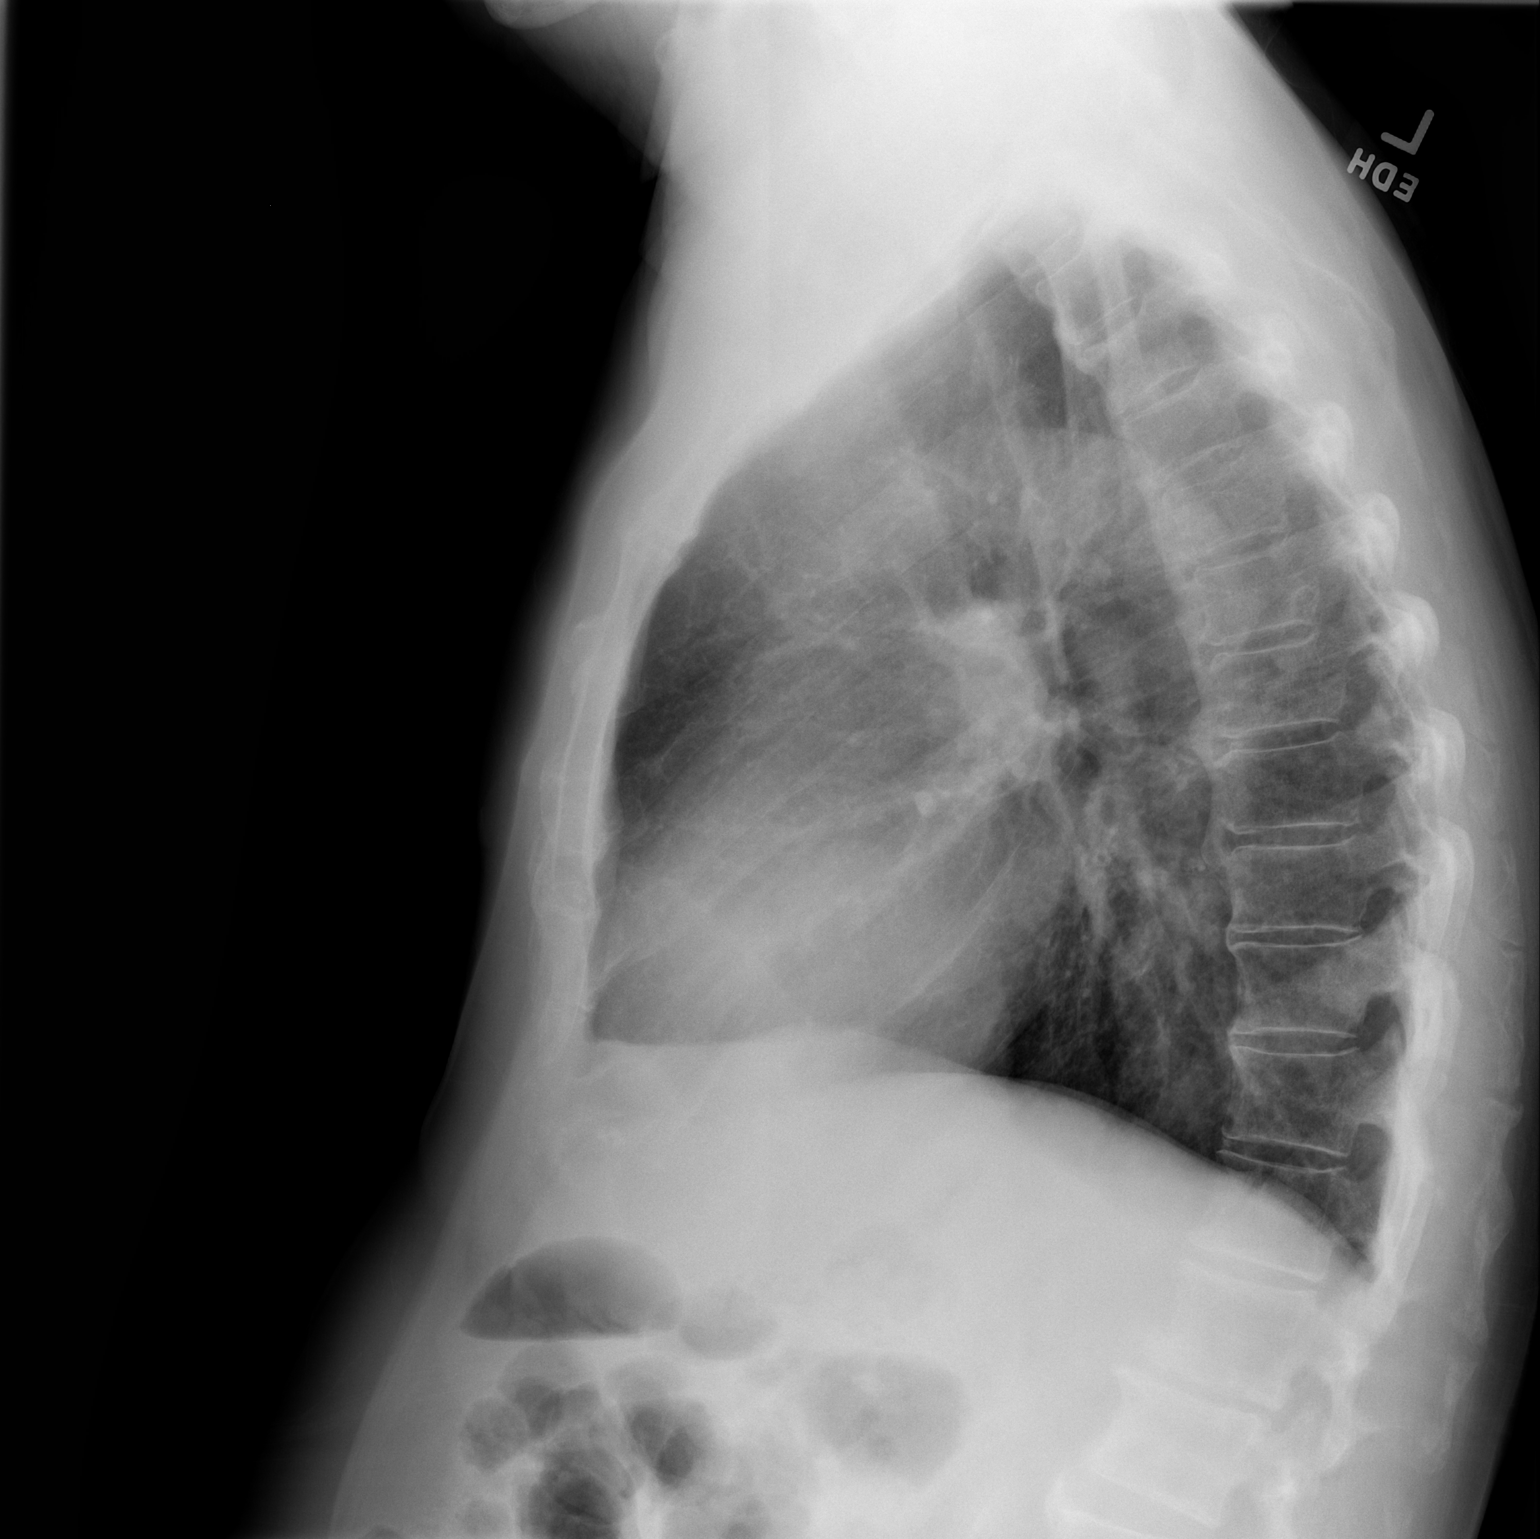

[2 of 2 positions shown; findings below may reference images not displayed]

FINDINGS: Stable cardiomediastinal silhouette with normal heart size. No
pneumothorax. No pleural effusion. Lungs appear clear, with no acute
consolidative airspace disease and no pulmonary edema. Healed
deformities in the posterior upper left ribs.
IMPRESSION: No active cardiopulmonary disease.

## 2020-02-11 NOTE — Patient Instructions (Signed)
It was nice to see you today,   I will get an x ray of your lungs.  If that is normal I will prescribe some allergy medication.  If it looks like a possible infection we will discuss antibiotics.    I will get a blood test to help rule out a blood clot in your leg.  If this is positive we will need to do an ultrasound. If negative it is likely due to a muscle strain.    Keep using the medications and gels you are using for your muscle pains.  These should improve over the next month.    Have a great day,   Clemetine Marker, MD

## 2020-02-11 NOTE — Telephone Encounter (Signed)
Patient LVM on nurse line stating he was seen today and was told a medication was going to be sent to his pharmacy. Will forward to provider who saw patient.

## 2020-02-12 MED ORDER — LORATADINE 10 MG PO TABS: 10.0000 mg | ORAL_TABLET | Freq: Every day | ORAL | 1 refills | Status: DC

## 2020-02-12 NOTE — Telephone Encounter (Signed)
I sent it in 

## 2020-02-13 DIAGNOSIS — M62838 Other muscle spasm: Secondary | ICD-10-CM | POA: Insufficient documentation

## 2020-02-13 DIAGNOSIS — M79661 Pain in right lower leg: Secondary | ICD-10-CM | POA: Insufficient documentation

## 2020-02-13 NOTE — Assessment & Plan Note (Signed)
[name] cough is seasonal per his history and therefore most likely related to allergies, but with patient's rhonchi on exam we will get chest x-ray to rule out infectious cause.  If negative will prescribe second GEN antihistamine

## 2020-02-13 NOTE — Assessment & Plan Note (Signed)
Most likely due to strain of the lateral head of the gastrocnemius muscle based on physical exam.  Although without a reason for the pain (injury or overuse) along with the patient's somewhat recent history of DVT with PE we will get a D-dimer.  If that is elevated will get DVT ultrasound.  Patient advised to seek medical attention if he has difficulty breathing or chest pain.

## 2020-02-13 NOTE — Assessment & Plan Note (Signed)
Mild.  Patient states he has had these issues in the past.  Does not appear to be related to vertebral disease based on his physical exam.  Unsure what triggered it.  Advised patient to continue the medications he is using and apply heat if needed.

## 2020-02-15 ENCOUNTER — Other Ambulatory Visit: Payer: Self-pay

## 2020-02-15 ENCOUNTER — Ambulatory Visit (HOSPITAL_COMMUNITY)
Admission: RE | Admit: 2020-02-15 | Discharge: 2020-02-15 | Disposition: A | Discharge status: Home / Self Care | Payer: Medicare HMO | Source: Ambulatory Visit | Attending: Family Medicine | Admitting: Family Medicine

## 2020-02-15 DIAGNOSIS — M79661 Pain in right lower leg: Secondary | ICD-10-CM | POA: Diagnosis not present

## 2020-02-15 NOTE — CV Procedure (Signed)
RLE venous duplex completed. Preliminary results given to Page, RN at [phone].  Results can be found under chart review under CV PROC. 02/15/2020 3:44 PM Leveta Wahab RVT, RDMS

## 2020-02-21 ENCOUNTER — Telehealth: Payer: Self-pay

## 2020-02-21 DIAGNOSIS — J9801 Acute bronchospasm: Secondary | ICD-10-CM | POA: Diagnosis not present

## 2020-02-21 DIAGNOSIS — R051 Acute cough: Secondary | ICD-10-CM | POA: Diagnosis not present

## 2020-02-21 NOTE — Telephone Encounter (Signed)
Patient and daughter in law call nurse line regarding continued cough and back pain since visit on 1/14. Patient had negative COVID test on 1/20. Patient is requesting additional cough medication to be sent to pharmacy. Patient has scheduled follow up with Dr. Erin Hearing on 2/9.   Patient is agreeable to seeing another provider if necessary, however, would like to see if Dr. Erin Hearing would be willing to send another medication to pharmacy first.   To PCP  Please advise  Talbot Grumbling, RN

## 2020-02-21 NOTE — Telephone Encounter (Signed)
Please let patient know he can try honey with tea for cough.   I recommend being seen before 2/9 given ongoing symptoms. Please let patient know and schedule.  Let me know if any questions.  Dorris Singh, MD  Family Medicine Teaching Service

## 2020-02-24 ENCOUNTER — Other Ambulatory Visit: Payer: Self-pay | Admitting: Family Medicine

## 2020-02-24 DIAGNOSIS — R42 Dizziness and giddiness: Secondary | ICD-10-CM

## 2020-03-08 ENCOUNTER — Other Ambulatory Visit: Payer: Self-pay

## 2020-03-08 ENCOUNTER — Encounter: Payer: Self-pay | Admitting: Family Medicine

## 2020-03-08 ENCOUNTER — Ambulatory Visit (INDEPENDENT_AMBULATORY_CARE_PROVIDER_SITE_OTHER): Payer: Medicare HMO | Admitting: Family Medicine

## 2020-03-08 DIAGNOSIS — R059 Cough, unspecified: Secondary | ICD-10-CM | POA: Diagnosis not present

## 2020-03-08 DIAGNOSIS — I10 Essential (primary) hypertension: Secondary | ICD-10-CM

## 2020-03-08 DIAGNOSIS — R634 Abnormal weight loss: Secondary | ICD-10-CM | POA: Insufficient documentation

## 2020-03-08 NOTE — Patient Instructions (Addendum)
Good to see you today!  Thanks for coming in.  For the weight - exercise every day One day walk about 30 minutes Every other day lift weights - start with low weight and build up in your arms and legs  Cut back on the amlodipine to 1/2 tab = 5 mg daily  Come back in one month for a check

## 2020-03-08 NOTE — Progress Notes (Signed)
    SUBJECTIVE:   CHIEF COMPLAINT / HPI:  Right calf pain Feels better.  Very mild swelling.  No pain  Cough Improved.  He went to an UC and got prednisone all finished now.  No shortness of breath or fever.  Still mild occaisional cough  Weight Loss Has lost about 10 lbs over last few months.  Is not longer working at a physical job.  Eating about the same.  No nausea and vomiting or abdomen pain or change in stools   PERTINENT  PMH / PSH: history of dvt in past.  Ca screen current  OBJECTIVE:   BP 110/60   Pulse 79   Wt 171 lb (77.6 kg)   SpO2 97%   BMI 23.85 kg/m   Heart - Regular rate and rhythm.  No murmurs, gallops or rubs.    Lungs:  Normal respiratory effort, chest expands symmetrically. Lungs are clear to auscultation, no crackles or wheezes. Neck:  No deformities, thyromegaly, masses, or tenderness noted.   Supple with full range of motion without pain. Extremities:  No cyanosis, edema, or deformity noted with good range of motion of all major joints.  , Mouth - no lesions, mucous membranes are moist, no decaying teeth     ASSESSMENT/PLAN:   Cough Improved.  No signs of infection.  Likely post viral.  Monitor  Essential hypertension, benign Blood pressure low today.  Will cut back on amlodipine to 5 mg daily.   He is asymptomatic  Weight loss About 15 lbs since in last year unintentional.  Less physically active since stopped work and has has resp viral infection.  Suggest regular exercise and increase his diet (his is vegetarian)  Recheck in one month.  Cxr in Jan was normal,      Lind Covert, MD [place]

## 2020-03-08 NOTE — Assessment & Plan Note (Signed)
About 15 lbs since in last year unintentional.  Less physically active since stopped work and has has resp viral infection.  Suggest regular exercise and increase his diet (his is vegetarian)  Recheck in one month.  Cxr in Jan was normal,

## 2020-03-08 NOTE — Assessment & Plan Note (Signed)
Improved.  No signs of infection.  Likely post viral.  Monitor

## 2020-03-08 NOTE — Assessment & Plan Note (Signed)
Blood pressure low today.  Will cut back on amlodipine to 5 mg daily.   He is asymptomatic

## 2020-03-31 ENCOUNTER — Other Ambulatory Visit: Payer: Self-pay | Admitting: Family Medicine

## 2020-04-03 ENCOUNTER — Other Ambulatory Visit: Payer: Self-pay | Admitting: Family Medicine

## 2020-04-04 ENCOUNTER — Other Ambulatory Visit: Payer: Self-pay | Admitting: Family Medicine

## 2020-04-11 ENCOUNTER — Other Ambulatory Visit: Payer: Self-pay

## 2020-04-11 ENCOUNTER — Encounter: Payer: Self-pay | Admitting: Family Medicine

## 2020-04-11 ENCOUNTER — Ambulatory Visit (INDEPENDENT_AMBULATORY_CARE_PROVIDER_SITE_OTHER): Payer: Medicare HMO | Admitting: Family Medicine

## 2020-04-11 DIAGNOSIS — R634 Abnormal weight loss: Secondary | ICD-10-CM

## 2020-04-11 DIAGNOSIS — G8929 Other chronic pain: Secondary | ICD-10-CM | POA: Diagnosis not present

## 2020-04-11 DIAGNOSIS — M545 Low back pain, unspecified: Secondary | ICD-10-CM | POA: Diagnosis not present

## 2020-04-11 DIAGNOSIS — I10 Essential (primary) hypertension: Secondary | ICD-10-CM | POA: Diagnosis not present

## 2020-04-11 MED ORDER — METHOCARBAMOL 500 MG PO TABS: 250.0000 mg | ORAL_TABLET | Freq: Every evening | ORAL | 0 refills | Status: DC | PRN

## 2020-04-11 NOTE — Assessment & Plan Note (Signed)
Stabilized with no symptoms.  Is exercising and eating more.  Continue to follow

## 2020-04-11 NOTE — Assessment & Plan Note (Signed)
Consistent with musculoskeletal  Pain.  No red flags.  See after visit summary for analgesic regimen.  He was insistent to try muscle relaxer.  Will do limited low dose robaxin which he took before.

## 2020-04-11 NOTE — Assessment & Plan Note (Signed)
Perhaps overtreated.  Will decrease amlodipine to 5 mg daily

## 2020-04-11 NOTE — Patient Instructions (Addendum)
Good to see you today - Thank you for coming in  Things we discussed today:  FOR THE BACK PAIN Take tylenol arthritis 1-2 tabs up to three times a day as needed If not helping then take Naproxen 1 tab once a day as needed If pain at night take Robaxin 1/2 tab as needed  For the BP Take 1/2 of Amlodipine once a day  Please always bring your medication bottles  Come back to see me in 2 months

## 2020-04-11 NOTE — Progress Notes (Signed)
    SUBJECTIVE:   CHIEF COMPLAINT / HPI:   Weight Loss - he has been exercising by walking and eating more.  Does not feel tire  Hypertension   Was to cut back on amlodipine to 5 mg daily.  Got 10 mg tabs from drug store and has been taking a whole tablet.  No falls  Back Pain  Has focal R sided flank lower back pain for about a week.   No falls or trauma or urine symptoms or fever or weakness.  Feels like pulled a muscle similar to several years ago when he got a muscle relaxer that really helped.  PERTINENT  PMH / PSH: Labs Aug 2021 stable, Cxr Jan 2022 normal   OBJECTIVE:   BP 112/72   Pulse 82   Wt 172 lb 9.6 oz (78.3 kg)   SpO2 96%   BMI 24.07 kg/m   Mobility:able to get up and down from exam table without assistance or distress Neurologic exam : Able to walk on heels and toes.   Balance normal  Romberg normal, finger to nose normal Back - focally mildly tender R flank above iliac crest.  No deformity or mass.  Can touch toes without pain  ASSESSMENT/PLAN:   Weight loss Stabilized with no symptoms.  Is exercising and eating more.  Continue to follow   Back pain Consistent with musculoskeletal  Pain.  No red flags.  See after visit summary for analgesic regimen.  He was insistent to try muscle relaxer.  Will do limited low dose robaxin which he took before.    Essential hypertension, benign Perhaps overtreated.  Will decrease amlodipine to 5 mg daily      Lind Covert, MD [place]

## 2020-04-17 ENCOUNTER — Telehealth: Payer: Self-pay

## 2020-04-17 MED ORDER — AMLODIPINE BESYLATE 5 MG PO TABS: 5.0000 mg | ORAL_TABLET | Freq: Every day | ORAL | 3 refills | Status: DC

## 2020-04-17 NOTE — Telephone Encounter (Signed)
Patient calls nurse line requesting new rx for amlodipine 5 mg tablets. Patient reports that he is having difficulty cutting pills in half. Advised patient that due to recent amlodipine refill, insurance may not cover this. Called pharmacist. Pharmacist requested that we go ahead and send over rx and they may be able to provide override due to change in dosage. Will forward to Dr. Erin Hearing.   Talbot Grumbling, RN

## 2020-05-18 ENCOUNTER — Other Ambulatory Visit: Payer: Self-pay | Admitting: Family Medicine

## 2020-06-03 DIAGNOSIS — J3089 Other allergic rhinitis: Secondary | ICD-10-CM | POA: Diagnosis not present

## 2020-06-06 ENCOUNTER — Encounter: Payer: Self-pay | Admitting: Family Medicine

## 2020-06-06 ENCOUNTER — Ambulatory Visit (INDEPENDENT_AMBULATORY_CARE_PROVIDER_SITE_OTHER): Payer: Medicare HMO | Admitting: Family Medicine

## 2020-06-06 ENCOUNTER — Other Ambulatory Visit: Payer: Self-pay

## 2020-06-06 ENCOUNTER — Ambulatory Visit (INDEPENDENT_AMBULATORY_CARE_PROVIDER_SITE_OTHER): Payer: Medicare HMO

## 2020-06-06 VITALS — BP 130/62 | HR 83 | Wt 175.0 lb

## 2020-06-06 DIAGNOSIS — J301 Allergic rhinitis due to pollen: Secondary | ICD-10-CM | POA: Diagnosis not present

## 2020-06-06 DIAGNOSIS — Z23 Encounter for immunization: Secondary | ICD-10-CM

## 2020-06-06 DIAGNOSIS — M5440 Lumbago with sciatica, unspecified side: Secondary | ICD-10-CM

## 2020-06-06 DIAGNOSIS — G8929 Other chronic pain: Secondary | ICD-10-CM

## 2020-06-06 DIAGNOSIS — E1169 Type 2 diabetes mellitus with other specified complication: Secondary | ICD-10-CM

## 2020-06-06 DIAGNOSIS — R634 Abnormal weight loss: Secondary | ICD-10-CM | POA: Diagnosis not present

## 2020-06-06 DIAGNOSIS — I10 Essential (primary) hypertension: Secondary | ICD-10-CM | POA: Diagnosis not present

## 2020-06-06 DIAGNOSIS — J309 Allergic rhinitis, unspecified: Secondary | ICD-10-CM | POA: Insufficient documentation

## 2020-06-06 LAB — POCT GLYCOSYLATED HEMOGLOBIN (HGB A1C): HbA1c, POC (controlled diabetic range): 6.9 % (ref 0.0–7.0)

## 2020-06-06 NOTE — Assessment & Plan Note (Signed)
Improved.  Consistent with musculoskeletal pain.  No signs of infection or fracture or tumor

## 2020-06-06 NOTE — Assessment & Plan Note (Signed)
Improved with antihistamine from UC.  See after visit summary for follow up

## 2020-06-06 NOTE — Assessment & Plan Note (Signed)
Well controlled.  Continue metformin

## 2020-06-06 NOTE — Assessment & Plan Note (Signed)
Stable weight without any red flags

## 2020-06-06 NOTE — Patient Instructions (Signed)
Good to see you today - Thank you for coming in  Things we discussed today:  I would lift light weights every other day to keep your strength   The wheezing is in your nose and upper airway not in your lungs.  Keep taking the antihistamine.   If you have shortness of breath or fever or lots sputum let me know  Please always bring your medication bottles  Come back to see me in 3 months we will check blood tests

## 2020-06-06 NOTE — Assessment & Plan Note (Signed)
At goal.  Continue current medications .  Check labs next visit

## 2020-06-06 NOTE — Progress Notes (Signed)
    SUBJECTIVE:   CHIEF COMPLAINT / HPI:   Weight Loss He is eating well.  No nausea and vomiting  Diabetes Brings in all his medications.  Weight is stable  Hypertension Brings in all medications.  No lightheadness or edema  Back Pain  Minimal now.  Takes as needed tylenol or naprosyn or robaxin (1/2 tab) but rarely.  No weakness  Allergies  His nose has been running and feels like he is wheezing or making noise when he breathes.  Seen in UC and given zyrtec which helps a lot.  No fever or shortness of breath or sputum or chest pain   PERTINENT  PMH / PSH: spoke with his daughter on the phone about the above   OBJECTIVE:   BP 130/62   Pulse 83   Wt 175 lb (79.4 kg)   SpO2 95%   BMI 24.41 kg/m   Heart - Regular rate and rhythm.  No murmurs, gallops or rubs.    Lungs:  Normal respiratory effort, chest expands symmetrically. Lungs are clear to auscultation, no crackles or wheezes. Do hear upper airway sounds - mild stridor with inspiration.  Improves when he breaths through his mouth Throat: normal mucosa, no exudate, uvula midline, no redness Neck:  No deformities, thyromegaly, masses, or tenderness noted.   Supple with full range of motion without pain. Extremities:  No cyanosis, edema, or deformity noted with good range of motion of all major joints.   Mobility:able to get up and down from exam table without assistance or distress    ASSESSMENT/PLAN:   Essential hypertension, benign At goal.  Continue current medications .  Check labs next visit   Diabetes mellitus, type II Well controlled.  Continue metformin  Back pain Improved.  Consistent with musculoskeletal pain.  No signs of infection or fracture or tumor  Weight loss Stable weight without any red flags   Allergic rhinitis Improved with antihistamine from UC.  See after visit summary for follow up      Lind Covert, [place]

## 2020-06-07 ENCOUNTER — Telehealth: Payer: Self-pay

## 2020-06-07 NOTE — Telephone Encounter (Signed)
Patient calls nurse line requesting chest X-ray. Patient reports that this was discussed at [facility] yesterday. I am unable to find this in office note. Will send to PCP for further clarification.   Talbot Grumbling, RN

## 2020-06-08 NOTE — Telephone Encounter (Signed)
Spoke w him Overall feeling better Still some nose congestion and intermittent breathing sounds No fever or shortness of breath  Wants to be seen next week I am out of town  Will call him again tomorrow

## 2020-06-09 MED ORDER — BENZONATATE 100 MG PO CAPS: ORAL_CAPSULE | ORAL | 0 refills | Status: DC

## 2020-06-09 NOTE — Telephone Encounter (Signed)
Feeling better.  Minimal sputum  Would like more tessalon  Will follow up

## 2020-06-09 NOTE — Addendum Note (Signed)
Addended by: Talbert Cage L on: 06/09/2020 04:58 PM   Modules accepted: Orders

## 2020-06-15 ENCOUNTER — Other Ambulatory Visit: Payer: Self-pay

## 2020-06-15 ENCOUNTER — Ambulatory Visit: Payer: Medicare HMO | Admitting: Family Medicine

## 2020-06-15 ENCOUNTER — Ambulatory Visit (INDEPENDENT_AMBULATORY_CARE_PROVIDER_SITE_OTHER): Payer: Medicare HMO | Admitting: Family Medicine

## 2020-06-15 VITALS — BP 97/82 | HR 75 | Ht 71.0 in | Wt 173.0 lb

## 2020-06-15 DIAGNOSIS — R2242 Localized swelling, mass and lump, left lower limb: Secondary | ICD-10-CM | POA: Diagnosis not present

## 2020-06-15 MED ORDER — DICLOFENAC SODIUM 1 % EX GEL: 2.0000 g | Freq: Four times a day (QID) | CUTANEOUS | 1 refills | Status: DC

## 2020-06-15 NOTE — Patient Instructions (Addendum)
Thank you for coming in to see [place] today! Please see below to review our plan for today's visit:  1. Stop taking Aspirin because this can make your stomach hurt.  2. You can take Tylenol 500mg  3-4 times daily for foot pain. Do not take much advil because of your kidney function/stomach pain. Take for 3 days.  3. Keep your foot elevated and apply a cold compress (frozen peas, ice in a bag, etc). Wear your sock on your foot when you are applying the cold compress. 4. We are checking your Uric Acid level to make sure you are not having gout.  5. Apply Voltaren Gel 1% to your foot 4 times daily.   If you develop worsening redness, foot pain, drainage, bleeding, fevers, body aches, nausea/vomiting, or other concerning symptoms please let us know or go be seen in the Emergency Department.    Please call the clinic at [phone] if your symptoms worsen or you have any concerns. It was our pleasure to serve you!   Dr. Milus Banister [facility]

## 2020-06-16 LAB — URIC ACID: Uric Acid: 8.1 mg/dL (ref 3.8–8.4)

## 2020-06-20 DIAGNOSIS — R2242 Localized swelling, mass and lump, left lower limb: Secondary | ICD-10-CM | POA: Insufficient documentation

## 2020-06-20 NOTE — Assessment & Plan Note (Addendum)
Patient experiencing localized swelling of left foot.  Reports the swelling is recurrent and occurs every 3-4 months.  Previous imaging shows mild-moderate degenerative changes of patient's left foot.  Patient with history of gout from many years ago (2011/12) but is not on any suppressive medications.  Of note patient is on HCTZ which could potentially exacerbate it.  Patient does have history of pulmonary embolism, however is only experiencing swelling in his left foot and not in his left calf, denies shortness of breath, chest pain, satting well, breath sounds normal. -We will check uric acid level at today's visit (result normal) -Take Tylenol 500mg  3-4 times daily for foot pain.  Advised against taking Advil due to kidney function/stomach pain.   -Keep foot elevated and apply a cold compress (frozen peas, ice in a bag, etc).  Do not apply cool compress directly to skin. -Apply Voltaren Gel 1% to foot 4 times daily.  -Strict return precautions provided: If patient develops worsening redness, foot pain, drainage, bleeding, fevers, body aches, nausea/vomiting, or other concerning symptoms please be seen in the Emergency Department.

## 2020-06-20 NOTE — Progress Notes (Signed)
    SUBJECTIVE:   CHIEF COMPLAINT / HPI:   Left foot pain: very pleasant patient reports to clinic with concerns for left foot swelling and mild tenderness.  Patient reports that every 3-4 months he experiences a few days of left foot tenderness/swelling that only stays in the left foot.  He denies any swelling/edema of his left lower extremity/calf.  He also denies any shortness of breath, tachycardia, tachypnea, or chest pain.  He denies any falls/injuries/traumas that could have contributed to his pain.  When he has this pain/sensation he tries to rest his leg, keep it elevated and he will occasionally apply ice to it as well as take over-the-counter pain reliever.  He is also recently been applying Aspercreme to his left foot couple times daily.  He has distant history of gout (2011) however no uric acid level is appreciated in his chart/records.  He denies fevers, body aches, chills, shortness of breath, chest pain.  Vitals are stable today.  PERTINENT  PMH / PSH:  Patient Active Problem List   Diagnosis Date Noted  . Localized swelling of left foot 06/20/2020  . Allergic rhinitis 06/06/2020  . Back pain 09/08/2018  . Trigger thumb 04/08/2018  . History of pulmonary embolism   . Recurrent cough 03/15/2014  . GERD (gastroesophageal reflux disease) 07/05/2013  . Osteoarthritis 02/26/2010  . GOUT, UNSPECIFIED 11/06/2009  . Diabetes mellitus, type II ([place]) 09/22/2009  . Hyperlipidemia 09/22/2009  . OBESITY, UNSPECIFIED 09/22/2009  . Essential hypertension, benign 09/22/2009     OBJECTIVE:   BP 97/82   Pulse 75   Ht 5\' 11"  (1.803 m)   Wt 173 lb (78.5 kg)   SpO2 95%   BMI 24.13 kg/m    Physical exam: General: Pleasant patient, nontoxic-appearing, no apparent distress Respiratory: CTA bilaterally, comfortable work of breathing on room air Cardio: RRR, S1-S2 present, no murmurs appreciated Left foot: -Inspection: 1+ nonpitting edema appreciated diffusely to patient's left foot  (right foot without swelling), no gross deformity appreciated -Palpation: Mild tenderness appreciated to base of third and fourth metatarsals without crepitus -Range of motion: Normal throughout foot although mildly limited secondary to tenderness -Strength: Patient able to ambulate with normal gait -Sensation/neuro: Neurovascularly intact, 2+ DP pulses, sensation intact  ASSESSMENT/PLAN:   Localized swelling of left foot Patient experiencing localized swelling of left foot.  Reports the swelling is recurrent and occurs every 3-4 months.  Previous imaging shows mild-moderate degenerative changes of patient's left foot.  Patient with history of gout from many years ago (2011/12) but is not on any suppressive medications.  Of note patient is on HCTZ which could potentially exacerbate it.  Patient does have history of pulmonary embolism, however is only experiencing swelling in his left foot and not in his left calf, denies shortness of breath, chest pain, satting well, breath sounds normal. -We will check uric acid level at today's visit (result normal) -Take Tylenol 500mg  3-4 times daily for foot pain.  Advised against taking Advil due to kidney function/stomach pain.   -Keep foot elevated and apply a cold compress (frozen peas, ice in a bag, etc).  Do not apply cool compress directly to skin. -Apply Voltaren Gel 1% to foot 4 times daily.  -Strict return precautions provided: If patient develops worsening redness, foot pain, drainage, bleeding, fevers, body aches, nausea/vomiting, or other concerning symptoms please be seen in the Emergency Department.      [place]

## 2020-06-21 ENCOUNTER — Ambulatory Visit: Payer: Medicare HMO | Admitting: Family Medicine

## 2020-06-21 ENCOUNTER — Ambulatory Visit
Admission: RE | Admit: 2020-06-21 | Discharge: 2020-06-21 | Disposition: A | Discharge status: Home / Self Care | Payer: Medicare HMO | Source: Ambulatory Visit | Attending: Family Medicine | Admitting: Family Medicine

## 2020-06-21 ENCOUNTER — Other Ambulatory Visit: Payer: Self-pay

## 2020-06-21 ENCOUNTER — Ambulatory Visit (INDEPENDENT_AMBULATORY_CARE_PROVIDER_SITE_OTHER): Payer: Medicare HMO | Admitting: Family Medicine

## 2020-06-21 ENCOUNTER — Encounter: Payer: Self-pay | Admitting: Family Medicine

## 2020-06-21 DIAGNOSIS — R2242 Localized swelling, mass and lump, left lower limb: Secondary | ICD-10-CM

## 2020-06-21 DIAGNOSIS — R058 Other specified cough: Secondary | ICD-10-CM

## 2020-06-21 DIAGNOSIS — R059 Cough, unspecified: Secondary | ICD-10-CM | POA: Diagnosis not present

## 2020-06-21 IMAGING — CR DG CHEST 2V
2 series · 2 of 2 positions shown · non-contrast
Comparison: Chest x-ray [DATE].

CLINICAL DATA: 79-year-old male with history of recurrent cough.

EXAM:
CHEST - 2 VIEW

[w chest pa]
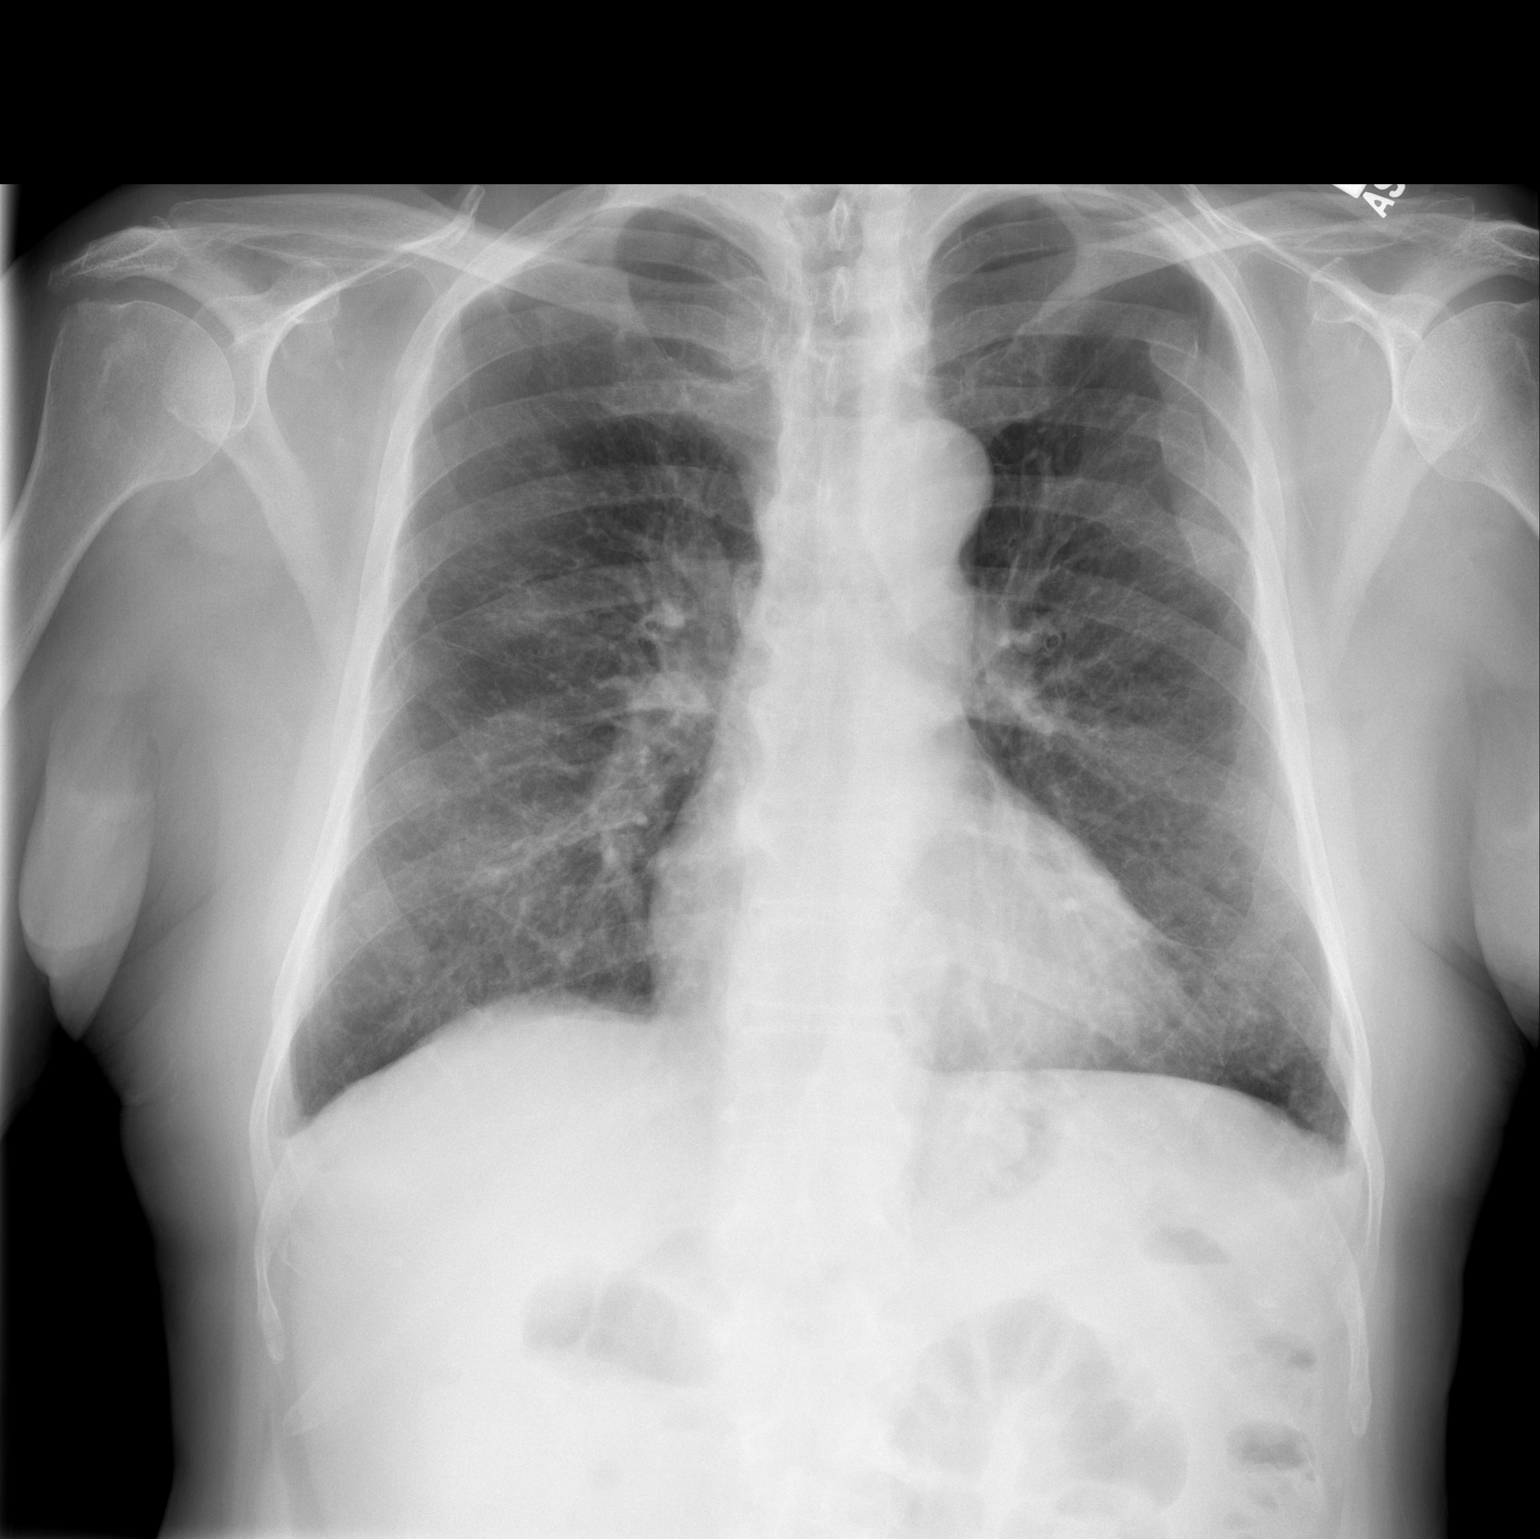

[w chest lat]
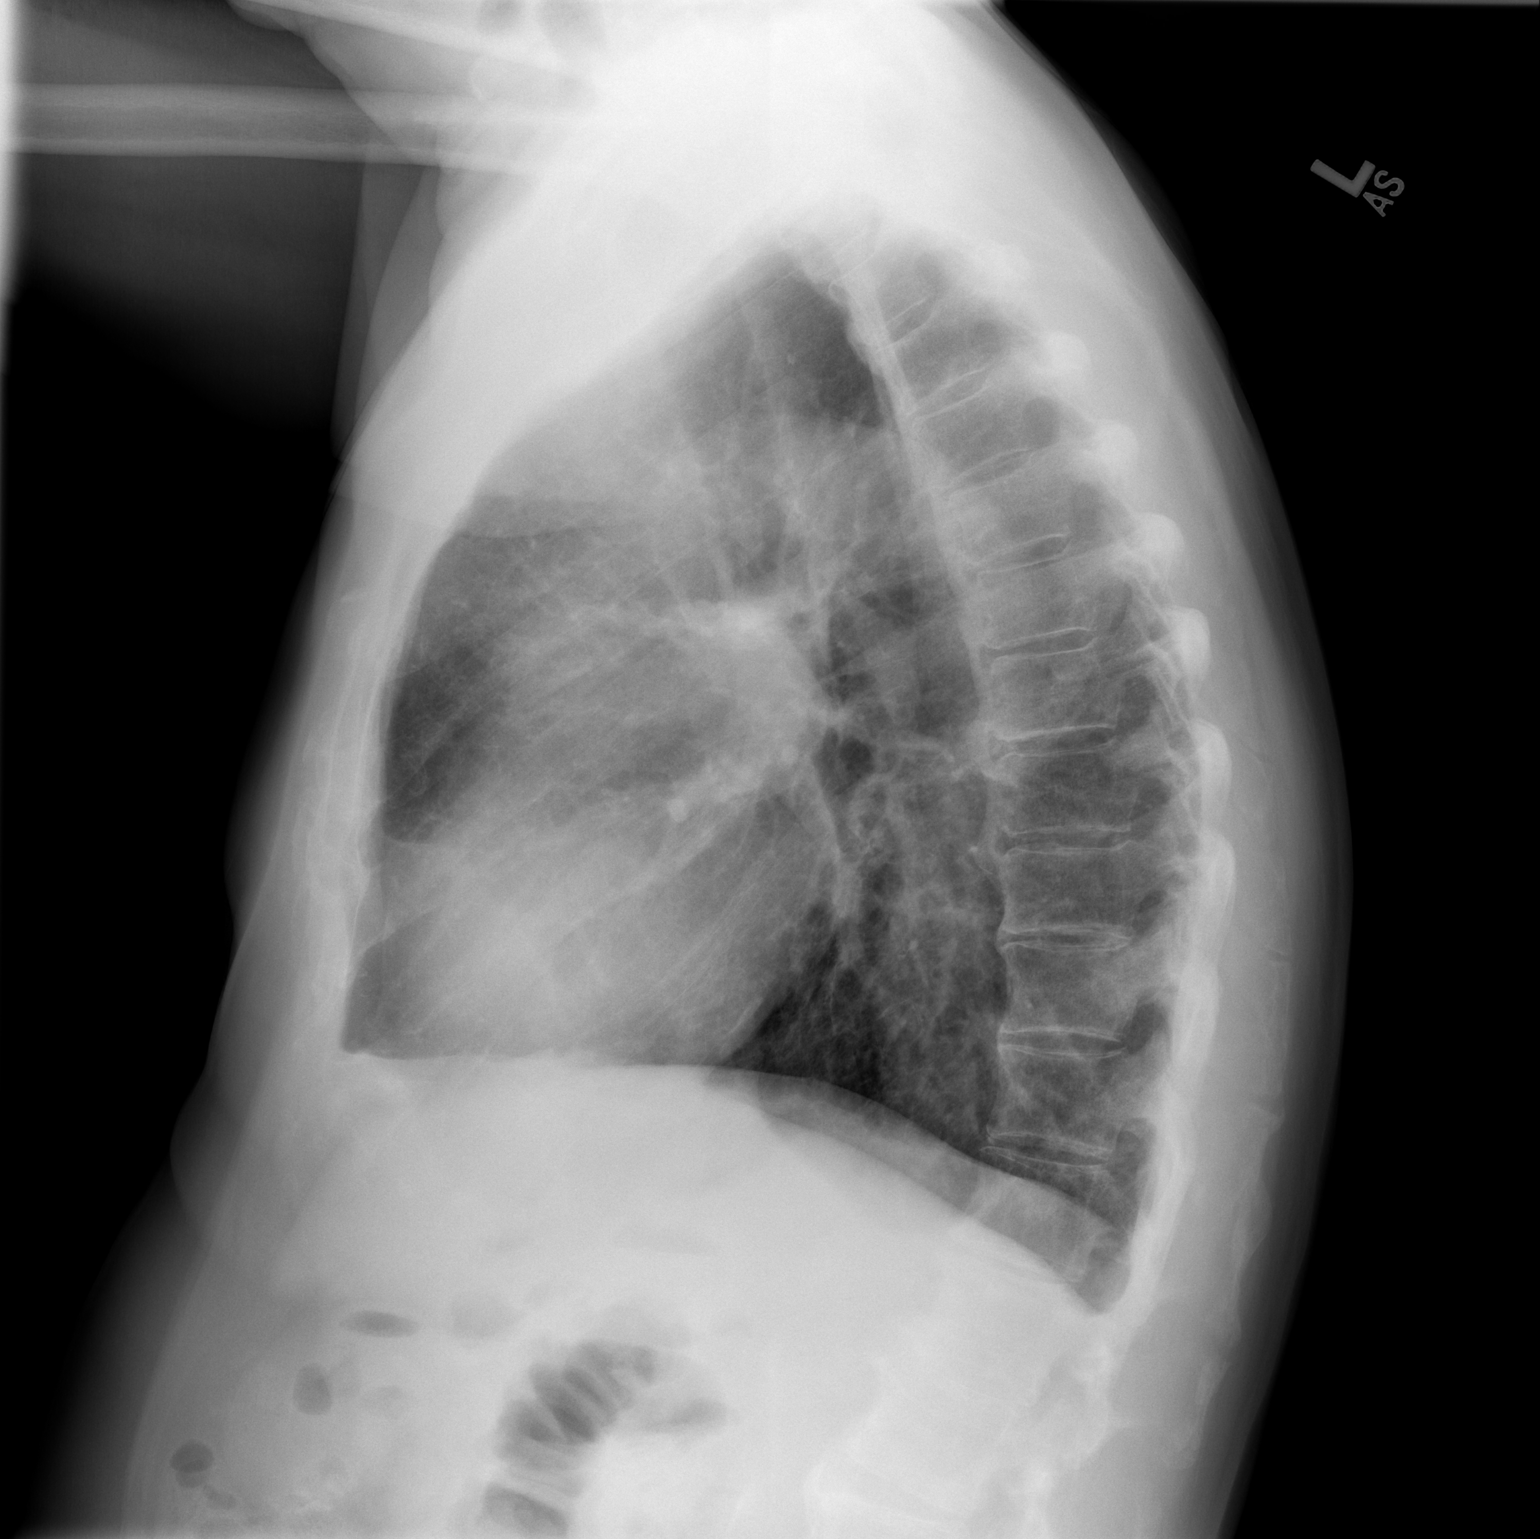

[2 of 2 positions shown; findings below may reference images not displayed]

FINDINGS: Patchy ill-defined opacities and areas of interstitial prominence
are noted throughout the mid to lower lungs bilaterally. Lung
volumes are normal. No confluent consolidative airspace disease. No
pleural effusions. No pneumothorax. No pulmonary nodule or mass
noted. Pulmonary vasculature and the cardiomediastinal silhouette
are within normal limits. Multiple old healed left-sided rib
fractures.
IMPRESSION: 1. Ill-defined opacities and areas of interstitial prominence in the
mid to lower lungs bilaterally, which could suggest an underlying
viral infection.

## 2020-06-21 NOTE — Progress Notes (Signed)
    SUBJECTIVE:   CHIEF COMPLAINT / HPI:   L Foot Swelling Has improved.  Never had pain nor redness.  Not using compression stockings  recent UA was 8.1  Cough Persistent on and off.  No shortness of breath or chest pain or fever or calf swelliing Uses tessalon as needed.  Spoke with daughter who is concerned about wants to check a cxr.  No reflux symptoms or wheezing  PERTINENT  PMH / PSH: history of PE  OBJECTIVE:   BP 125/80   Pulse 82   Ht 5\' 11"  (1.803 m)   Wt 171 lb 12.8 oz (77.9 kg)   SpO2 96%   BMI 23.96 kg/m   L foot - no edema or redness FROM Lungs:  Normal respiratory effort, chest expands symmetrically. Lungs are clear to auscultation, no crackles or wheezes. Mobility:able to get up and down from exam table without assistance or distress Abdomen: soft and non-tender without masses, organomegaly or hernias noted.  No guarding or rebound   ASSESSMENT/PLAN:   Localized swelling of left foot Improved.  Unlikely to be gout without pain or redness.  Likely localized venous insuff.  No signs of dvt.  Suggest compression stockings   Recurrent cough Will check cxr.  Likely post viral.  No weight loss or signs of cancer or infection     Lind Covert, MD [place]

## 2020-06-21 NOTE — Assessment & Plan Note (Signed)
Improved.  Unlikely to be gout without pain or redness.  Likely localized venous insuff.  No signs of dvt.  Suggest compression stockings

## 2020-06-21 NOTE — Patient Instructions (Addendum)
Good to see you today - Thank you for coming in  Things we discussed today:  Your weight is good  I will send reslts for cxr to My Chart   Wear compression stockings when you have leg swelling   Please always bring your medication bottles  Come back to see me in 3 months

## 2020-06-21 NOTE — Assessment & Plan Note (Signed)
Will check cxr.  Likely post viral.  No weight loss or signs of cancer or infection

## 2020-06-30 ENCOUNTER — Other Ambulatory Visit: Payer: Self-pay | Admitting: Family Medicine

## 2020-08-01 ENCOUNTER — Ambulatory Visit: Payer: Medicare HMO | Admitting: Family Medicine

## 2020-08-09 ENCOUNTER — Encounter: Payer: Self-pay | Admitting: Family Medicine

## 2020-08-09 ENCOUNTER — Ambulatory Visit (INDEPENDENT_AMBULATORY_CARE_PROVIDER_SITE_OTHER): Payer: Medicare Other | Admitting: Family Medicine

## 2020-08-09 ENCOUNTER — Other Ambulatory Visit: Payer: Self-pay

## 2020-08-09 VITALS — BP 128/72 | HR 76 | Wt 172.2 lb

## 2020-08-09 DIAGNOSIS — M1990 Unspecified osteoarthritis, unspecified site: Secondary | ICD-10-CM

## 2020-08-09 DIAGNOSIS — R058 Other specified cough: Secondary | ICD-10-CM | POA: Diagnosis not present

## 2020-08-09 DIAGNOSIS — K219 Gastro-esophageal reflux disease without esophagitis: Secondary | ICD-10-CM | POA: Diagnosis not present

## 2020-08-09 DIAGNOSIS — Z1159 Encounter for screening for other viral diseases: Secondary | ICD-10-CM | POA: Diagnosis not present

## 2020-08-09 DIAGNOSIS — E1169 Type 2 diabetes mellitus with other specified complication: Secondary | ICD-10-CM | POA: Diagnosis not present

## 2020-08-09 NOTE — Progress Notes (Signed)
    SUBJECTIVE:   CHIEF COMPLAINT / HPI:   Swollen Joints Has swelling with mild pain in RIGHT wrist and RIGHT ankle.  No known injury.  Came on gradually. Mild pain for which he takes naprosyn which helps.  Walking and using R hand without problems.  No fever or rash or fatigue.  Has had similar episodes in Left foot.    Cough Much improved.  No shortness of breath or chest pain with exertion.  Back to his regular exercise regimen of walking.  No edema    PERTINENT  PMH / PSH: Recent UA - 8   OBJECTIVE:   BP 128/72   Pulse 76   Wt 172 lb 3.2 oz (78.1 kg)   SpO2 95%   BMI 24.02 kg/m   R wrist - diffuse mild swelling.  FROM with very mild tenderness R Ankle - diffuse swelling.  Able to walk without limp or pain  Skin - no rash Throat: normal mucosa, no exudate, uvula midline, no redness Lungs:  Normal respiratory effort, chest expands symmetrically. Lungs are clear to auscultation, no crackles or wheezes. Heart - Regular rate and rhythm.  No murmurs, gallops or rubs.      ASSESSMENT/PLAN:   Arthritis Has recurrent episodes of joint swelling mostly in feet or wrists.  No systemic symptoms.  Mildly painful so I think gout is less likely than DJD. (UA was 8 in May)   Xray of left foot in past has shown DJD.  Will check ESR and continue to treat with as needed naprosyn.  If systemic signs may need further lab evaluation.  If persists TC gout therapy   Recurrent cough Improved.  Normal lung exam.  Cxr had shown likely viral bronchitis.    GERD (gastroesophageal reflux disease) Stable Taking PPI as needed.  No warning signs of trouble swallowing or weight loss.       Lind Covert, MD [place]

## 2020-08-09 NOTE — Assessment & Plan Note (Addendum)
Has recurrent episodes of joint swelling mostly in feet or wrists.  No systemic symptoms.  Mildly painful so I think gout is less likely than DJD. (UA was 8 in May)   Xray of left foot in past has shown DJD.  Will check ESR and continue to treat with as needed naprosyn.  If systemic signs may need further lab evaluation.  If persists TC gout therapy

## 2020-08-09 NOTE — Assessment & Plan Note (Signed)
Stable Taking PPI as needed.  No warning signs of trouble swallowing or weight loss.

## 2020-08-09 NOTE — Patient Instructions (Addendum)
Good to see you today - Thank you for coming in  Things we discussed today:  Call me if any fever or worsening arthritis or any rash  I will call you if your tests are not good.  Otherwise, I will send you a message on MyChart (if it is active) or a letter in the mail..  If you do not hear from me with in 2 weeks please call our office.      Please always bring your medication bottles  Come back to see me in 2 months - mid September

## 2020-08-09 NOTE — Assessment & Plan Note (Signed)
Improved.  Normal lung exam.  Cxr had shown likely viral bronchitis.

## 2020-08-10 LAB — CMP14+EGFR
ALT: 7 IU/L (ref 0–44)
AST: 10 IU/L (ref 0–40)
Albumin/Globulin Ratio: 1.2 (ref 1.2–2.2)
Albumin: 3.8 g/dL (ref 3.7–4.7)
Alkaline Phosphatase: 85 IU/L (ref 44–121)
BUN/Creatinine Ratio: 14 (ref 10–24)
BUN: 20 mg/dL (ref 8–27)
Bilirubin Total: 0.7 mg/dL (ref 0.0–1.2)
CO2: 23 mmol/L (ref 20–29)
Calcium: 9.3 mg/dL (ref 8.6–10.2)
Chloride: 100 mmol/L (ref 96–106)
Creatinine, Ser: 1.4 mg/dL — ABNORMAL HIGH (ref 0.76–1.27)
Globulin, Total: 3.3 g/dL (ref 1.5–4.5)
Glucose: 150 mg/dL — ABNORMAL HIGH (ref 65–99)
Potassium: 4.3 mmol/L (ref 3.5–5.2)
Sodium: 140 mmol/L (ref 134–144)
Total Protein: 7.1 g/dL (ref 6.0–8.5)
eGFR: 51 mL/min/{1.73_m2} — ABNORMAL LOW (ref >59)

## 2020-08-10 LAB — CBC
Hematocrit: 37.3 % — ABNORMAL LOW (ref 37.5–51.0)
Hemoglobin: 12 g/dL — ABNORMAL LOW (ref 13.0–17.7)
MCH: 25.1 pg — ABNORMAL LOW (ref 26.6–33.0)
MCHC: 32.2 g/dL (ref 31.5–35.7)
MCV: 78 fL — ABNORMAL LOW (ref 79–97)
Platelets: 297 10*3/uL (ref 150–450)
RBC: 4.78 x10E6/uL (ref 4.14–5.80)
RDW: 14.1 % (ref 11.6–15.4)
WBC: 11.7 10*3/uL — ABNORMAL HIGH (ref 3.4–10.8)

## 2020-08-10 LAB — HEPATITIS C ANTIBODY: Hep C Virus Ab: <0.1 s/co ratio (ref 0.0–0.9)

## 2020-08-10 LAB — SEDIMENTATION RATE: Sed Rate: 74 mm/hr — ABNORMAL HIGH (ref 0–30)

## 2020-08-16 ENCOUNTER — Telehealth: Payer: Self-pay | Admitting: Family Medicine

## 2020-08-16 DIAGNOSIS — D649 Anemia, unspecified: Secondary | ICD-10-CM

## 2020-08-16 NOTE — Telephone Encounter (Signed)
[name] daughter returns call to nurse line. Informed of below. Scheduled patient for lab appointment tomorrow morning at 8:45.  Talbot Grumbling, RN

## 2020-08-16 NOTE — Assessment & Plan Note (Signed)
Anemic on recent cbc.   Have attempted to call him and send mychart message

## 2020-08-16 NOTE — Telephone Encounter (Signed)
Left vm to call our office  Needs to come in for labs After should start iron Will refer to GI for evaluation

## 2020-08-17 ENCOUNTER — Other Ambulatory Visit: Payer: Medicare Other

## 2020-08-17 ENCOUNTER — Encounter: Payer: Self-pay | Admitting: Family Medicine

## 2020-08-17 ENCOUNTER — Other Ambulatory Visit: Payer: Self-pay

## 2020-08-17 DIAGNOSIS — D649 Anemia, unspecified: Secondary | ICD-10-CM | POA: Diagnosis not present

## 2020-08-17 DIAGNOSIS — R700 Elevated erythrocyte sedimentation rate: Secondary | ICD-10-CM | POA: Insufficient documentation

## 2020-08-17 NOTE — Telephone Encounter (Signed)
Spoke w daughter  He is feeling well except for mild intermittent cough Foot swelling is improved No shortness of breath or lightheadness  Recommend - Stop Naproxen or ibuprofen, ok to use voltaren gel and Tylenol as needed - Start ferrous sulfate 325 mg one daily - make an appointment to see me in 1 month - Call if any visible bleeding or lightheadness shortness of breath   - I will be in touch about blood tests today

## 2020-08-18 ENCOUNTER — Encounter: Payer: Self-pay | Admitting: Family Medicine

## 2020-08-18 LAB — CBC WITH DIFFERENTIAL/PLATELET
Basophils Absolute: 0.1 10*3/uL (ref 0.0–0.2)
Basos: 1 %
EOS (ABSOLUTE): 0.4 10*3/uL (ref 0.0–0.4)
Eos: 5 %
Hematocrit: 38.1 % (ref 37.5–51.0)
Hemoglobin: 12.2 g/dL — ABNORMAL LOW (ref 13.0–17.7)
Immature Grans (Abs): 0 10*3/uL (ref 0.0–0.1)
Immature Granulocytes: 0 %
Lymphocytes Absolute: 2.2 10*3/uL (ref 0.7–3.1)
Lymphs: 25 %
MCH: 25.4 pg — ABNORMAL LOW (ref 26.6–33.0)
MCHC: 32 g/dL (ref 31.5–35.7)
MCV: 79 fL (ref 79–97)
Monocytes Absolute: 0.9 10*3/uL (ref 0.1–0.9)
Monocytes: 10 %
Neutrophils Absolute: 5.4 10*3/uL (ref 1.4–7.0)
Neutrophils: 59 %
Platelets: 345 10*3/uL (ref 150–450)
RBC: 4.8 x10E6/uL (ref 4.14–5.80)
RDW: 14.4 % (ref 11.6–15.4)
WBC: 9.1 10*3/uL (ref 3.4–10.8)

## 2020-08-18 LAB — IRON AND TIBC
Iron Saturation: 18 % (ref 15–55)
Iron: 57 ug/dL (ref 38–169)
Total Iron Binding Capacity: 316 ug/dL (ref 250–450)
UIBC: 259 ug/dL (ref 111–343)

## 2020-08-18 LAB — FERRITIN: Ferritin: 27 ng/mL — ABNORMAL LOW (ref 30–400)

## 2020-08-21 ENCOUNTER — Telehealth: Payer: Self-pay

## 2020-08-21 NOTE — Telephone Encounter (Signed)
Patient calls nurse line requesting to speak to Dr. Erin Hearing regarding arthritic pain. Patient reports that pain disrupted his sleep last night. Advised that he could take tylenol Q6 as needed. Patient is asking if he can take advil.   Will refer to PCP as patient's last creatinine levels were slightly elevated.   Talbot Grumbling, RN

## 2020-08-21 NOTE — Telephone Encounter (Signed)
Called patient. Discussed hand pain. This is not new, no redness, swelling, edema. No injury. Recommended voltaren gel, follow up immediately if worsens. Also answered questions about labs, iron. Reviewed Dr. Erin Hearing' recommendations. Patient appreciative of Dr. Erin Hearing' care.  Dorris Singh, MD  Family Medicine Teaching Service

## 2020-08-22 DIAGNOSIS — T560X1A Toxic effect of lead and its compounds, accidental (unintentional), initial encounter: Secondary | ICD-10-CM | POA: Diagnosis not present

## 2020-08-30 ENCOUNTER — Other Ambulatory Visit: Payer: Self-pay | Admitting: Family Medicine

## 2020-09-05 ENCOUNTER — Other Ambulatory Visit: Payer: Self-pay

## 2020-09-05 ENCOUNTER — Ambulatory Visit (INDEPENDENT_AMBULATORY_CARE_PROVIDER_SITE_OTHER): Payer: Medicare Other | Admitting: Family Medicine

## 2020-09-05 DIAGNOSIS — R058 Other specified cough: Secondary | ICD-10-CM | POA: Diagnosis not present

## 2020-09-05 DIAGNOSIS — D649 Anemia, unspecified: Secondary | ICD-10-CM

## 2020-09-05 DIAGNOSIS — M1990 Unspecified osteoarthritis, unspecified site: Secondary | ICD-10-CM

## 2020-09-05 NOTE — Assessment & Plan Note (Signed)
Mild likely due to GI loss.  Had upper and lower endo in 2017 for similar presentation.  Will stop NSAIDs and take iron and monitor.  Will not send for further gi work up unless does not respond

## 2020-09-05 NOTE — Patient Instructions (Signed)
Good to see you today - Thank you for coming in  Things we discussed today:  For the groin leg pain - Take Tylenol Arthritis up to three times a day as needed. If gets worse then call me and we will get an xray  Do NOT take - Advil, Naprosyn or Asprin or Ibuprofen, Goody powders   Please always bring your medication bottles  Come back to see me in in September

## 2020-09-05 NOTE — Progress Notes (Signed)
    SUBJECTIVE:   CHIEF COMPLAINT / HPI:   Anemia Found on last blood test.  Mild decrease from Hgb 14->12.  No symptoms of shortness of breath or chest pain or lightheadness.  Has stopped all NSAIDs.  Taking iron daily.  No melena or heart burn  Arthritis His swollen foot joints have resolved as has his R wrist pain.  He saw ortho and was diagnosed as gout and given colchicine which he finished  R Hip pain Has pain in R upper leg.  Worse with some movements. No leg or knee swelling or redness.  Not severe enough to prevent walking  Cough Completely resolved.  No cough or sputum or chest pain or shortness of breath   PERTINENT  PMH / PSH: history of anemia in past had GI evaluation in 2017 without findings.  Was recommended to have capsule study.  Anemia resolved on own and iron was stopped  OBJECTIVE:   BP 125/80   Pulse 75   Ht 5\' 11"  (1.803 m)   Wt 171 lb 12.8 oz (77.9 kg)   SpO2 95%   BMI 23.96 kg/m   Alert nad Mobility:able to get up and down from exam table without assistance or distress R leg - pain that radiates to his groin reproduced with internal external rotation of hip. No knee pain or redness or effusion.  No edema  Lungs:  Normal respiratory effort, chest expands symmetrically. Lungs are clear to auscultation, no crackles or wheezes. Heart - Regular rate and rhythm.  No murmurs, gallops or rubs.      ASSESSMENT/PLAN:   Recurrent cough Resolved.  If recurs will need to follow up recent Cxr that showed bronchitis   Arthritis Improved.  May have gout although most recent UA was normal.  Will monitor.  Asked him to stop all NSAIDs given his anemia.  Use topicals and tylenol arthritis.  If his R hip pain does not improve will check xray   Anemia Mild likely due to GI loss.  Had upper and lower endo in 2017 for similar presentation.  Will stop NSAIDs and take iron and monitor.  Will not send for further gi work up unless does not respond      Lind Covert, [place]

## 2020-09-05 NOTE — Assessment & Plan Note (Signed)
Improved.  May have gout although most recent UA was normal.  Will monitor.  Asked him to stop all NSAIDs given his anemia.  Use topicals and tylenol arthritis.  If his R hip pain does not improve will check xray

## 2020-09-05 NOTE — Assessment & Plan Note (Signed)
Resolved.  If recurs will need to follow up recent Cxr that showed bronchitis

## 2020-09-12 DIAGNOSIS — M10031 Idiopathic gout, right wrist: Secondary | ICD-10-CM | POA: Diagnosis not present

## 2020-09-14 ENCOUNTER — Other Ambulatory Visit: Payer: Self-pay | Admitting: Family Medicine

## 2020-10-10 ENCOUNTER — Ambulatory Visit: Payer: Medicare Other | Admitting: Family Medicine

## 2020-10-18 ENCOUNTER — Encounter: Payer: Self-pay | Admitting: Family Medicine

## 2020-10-18 ENCOUNTER — Ambulatory Visit (INDEPENDENT_AMBULATORY_CARE_PROVIDER_SITE_OTHER): Payer: Medicare Other | Admitting: Family Medicine

## 2020-10-18 ENCOUNTER — Other Ambulatory Visit: Payer: Self-pay

## 2020-10-18 VITALS — BP 142/68 | HR 82 | Wt 171.8 lb

## 2020-10-18 DIAGNOSIS — R2242 Localized swelling, mass and lump, left lower limb: Secondary | ICD-10-CM | POA: Diagnosis not present

## 2020-10-18 DIAGNOSIS — E1169 Type 2 diabetes mellitus with other specified complication: Secondary | ICD-10-CM | POA: Diagnosis not present

## 2020-10-18 DIAGNOSIS — D649 Anemia, unspecified: Secondary | ICD-10-CM | POA: Diagnosis not present

## 2020-10-18 DIAGNOSIS — K219 Gastro-esophageal reflux disease without esophagitis: Secondary | ICD-10-CM | POA: Diagnosis not present

## 2020-10-18 DIAGNOSIS — I10 Essential (primary) hypertension: Secondary | ICD-10-CM

## 2020-10-18 DIAGNOSIS — M109 Gout, unspecified: Secondary | ICD-10-CM

## 2020-10-18 DIAGNOSIS — Z23 Encounter for immunization: Secondary | ICD-10-CM

## 2020-10-18 LAB — POCT GLYCOSYLATED HEMOGLOBIN (HGB A1C): HbA1c, POC (controlled diabetic range): 6.4 % (ref 0.0–7.0)

## 2020-10-18 MED ORDER — ZOSTER VAC RECOMB ADJUVANTED 50 MCG/0.5ML IM SUSR: INTRAMUSCULAR | 1 refills | Status: DC

## 2020-10-18 NOTE — Assessment & Plan Note (Signed)
Well-controlled.  Asymptomatic. -Continue omeprazole

## 2020-10-18 NOTE — Progress Notes (Signed)
SUBJECTIVE:   CHIEF COMPLAINT:  HPI:   Anemia Last hemoglobin on July 2022 was 12.2, which was a decrease from 14 2 years prior.  He is currently taking 325 mg ferrous sulfate tablet per day, as he was seen to have low ferritin and a low normal iron.  He is currently avoiding all NSAID use.  He denies any symptoms including fatigue, shortness of breath, exertional dyspnea, chest pain, dizziness, lightheadedness.  Acute gout right wrist Saw Dr. Burney Gauze orthopaedics for suspected gout flare of his right wrist.  He does not have common risk factors for gout as he is vegetarian and does not drink.  He does have risk factors of diabetes and hypertension, but these are well controlled.  He is on hydrochlorothiazide, however he has been on this for many years and has been stable.  Orthopedics gave celecoxib and colchine which he takes only as needed. Last took colchicine 3 days, when pain occurs. Celecoxib only when pain is not controlled by colchicine, last took 1 month ago.  His right wrist is mildly swollen, without tenderness.  He has full range of motion with his right wrist.  GERD Well-controlled with omeprazole as needed.  Remains asymptomatic at this time.  Essential hypertension Blood pressure was slightly elevated this morning at 132/68.  Typically runs in the 120s over 80s.  He is compliant with his blood pressure medications as listed in the medication section, and is without side effects.  Remains in symptomatic at this time.  ROS: As listed above.  All other ROS negative  Arthur [name] is a 80 y.o. yo with history notable for anemia, gout flare, GERD, hypertension presenting for health maintenance and management of his chronic medical conditions.   PERTINENT  PMH / PSH/Family/Social History : Reviewed and updated.  OBJECTIVE:   BP (!) 142/68   Pulse 82   Wt 171 lb 12.8 oz (77.9 kg)   SpO2 96%   BMI 23.96 kg/m   Today's weight:  Last Weight  Most recent update:  10/18/2020  8:53 AM    Weight  77.9 kg (171 lb 12.8 oz)            Review of prior weights: [employer]   10/18/20 0853  Weight: 171 lb 12.8 oz (77.9 kg)   Physical Exam HENT:     Head: Normocephalic and atraumatic.  Cardiovascular:     Rate and Rhythm: Normal rate and regular rhythm.     Pulses: Normal pulses.     Heart sounds: Normal heart sounds.  Pulmonary:     Effort: Pulmonary effort is normal.     Breath sounds: Normal breath sounds.  Abdominal:     General: Abdomen is flat. Bowel sounds are normal.     Palpations: Abdomen is soft.  Musculoskeletal:     Right wrist: Swelling (Mild swelling on lateral side.) present. No deformity, tenderness, bony tenderness or crepitus. Normal range of motion. Normal pulse.     Cervical back: Normal range of motion and neck supple.     Comments: Full ROM of right wrist.   Skin:    General: Skin is warm and dry.     Findings: No erythema or rash.      ASSESSMENT/PLAN:   Anemia Asymptomatic anemia.  Currently taking iron supplementation. -Recheck hemoglobin  GERD (gastroesophageal reflux disease) Well-controlled.  Asymptomatic. -Continue omeprazole  Gout flare He has seen orthopedics.  Takes colchicine as needed, and celecoxib only when pain is intractable.  Has been without a flare for 1 month.  Mild swelling on the lateral right wrist today, not tender, good ROM. -Check uric acid -Continue colchicine and celecoxib as needed  Diabetes mellitus, type II (HCC) Recheck A1c today.  Has been well controlled on metformin. -Continue metformin  Essential hypertension, benign Well-controlled.  Slight elevation in blood pressure today. -Recheck next visit -Continue amlodipine, hydrochlorothiazide, losartan, atenolol       Arthur [name], [place]

## 2020-10-18 NOTE — Assessment & Plan Note (Addendum)
Asymptomatic anemia.  Currently taking iron supplementation. -Recheck hemoglobin  Is improved.  Continue iron replacement.

## 2020-10-18 NOTE — Assessment & Plan Note (Signed)
Well-controlled.  Slight elevation in blood pressure today. -Recheck next visit -Continue amlodipine, hydrochlorothiazide, losartan, atenolol

## 2020-10-18 NOTE — Assessment & Plan Note (Signed)
Recheck A1c today.  Has been well controlled on metformin. -Continue metformin

## 2020-10-18 NOTE — Assessment & Plan Note (Addendum)
He has seen orthopedics.  Takes colchicine as needed, and celecoxib only when pain is intractable.  Has been without a flare for 1 month.  Mild swelling on the lateral right wrist today, not tender, good ROM. -Check uric acid -Continue colchicine and celecoxib as needed  Uric acid is high normal.  Will discuss with him going on a daily medication to prevent gout if he has more flares

## 2020-10-18 NOTE — Patient Instructions (Signed)
Good to see you today - Thank you for coming in  Things we discussed today:  Keep taking all your medications as you are.  Your diabetes is doing very well.    I will call you if your tests are not good.  Otherwise, I will send you a message on MyChart (if it is active) or a letter in the mail..  If you do not hear from me with in 2 weeks please call our office.     I sent a prescription to your pharmacy for your Zoster vaccines to help prevent Shingles.  The shot may cause a sore arm and mild flu like symptoms for a few days.  You will need a second shot 2 months after your first.    Please always bring your medication bottles  Come back to see me in 3 months

## 2020-10-19 LAB — CBC
Hematocrit: 39.5 % (ref 37.5–51.0)
Hemoglobin: 12.6 g/dL — ABNORMAL LOW (ref 13.0–17.7)
MCH: 26 pg — ABNORMAL LOW (ref 26.6–33.0)
MCHC: 31.9 g/dL (ref 31.5–35.7)
MCV: 82 fL (ref 79–97)
Platelets: 267 10*3/uL (ref 150–450)
RBC: 4.84 x10E6/uL (ref 4.14–5.80)
RDW: 14.5 % (ref 11.6–15.4)
WBC: 9.9 10*3/uL (ref 3.4–10.8)

## 2020-10-19 LAB — URIC ACID: Uric Acid: 7.9 mg/dL (ref 3.8–8.4)

## 2020-11-02 ENCOUNTER — Encounter: Payer: Self-pay | Admitting: Family Medicine

## 2020-11-02 ENCOUNTER — Other Ambulatory Visit: Payer: Self-pay

## 2020-11-02 ENCOUNTER — Ambulatory Visit (INDEPENDENT_AMBULATORY_CARE_PROVIDER_SITE_OTHER): Payer: Medicare Other | Admitting: Family Medicine

## 2020-11-02 VITALS — BP 136/63 | HR 73 | Wt 169.6 lb

## 2020-11-02 DIAGNOSIS — M79661 Pain in right lower leg: Secondary | ICD-10-CM

## 2020-11-02 DIAGNOSIS — I10 Essential (primary) hypertension: Secondary | ICD-10-CM | POA: Diagnosis not present

## 2020-11-02 DIAGNOSIS — M1A0310 Idiopathic chronic gout, right wrist, without tophus (tophi): Secondary | ICD-10-CM

## 2020-11-02 DIAGNOSIS — Z23 Encounter for immunization: Secondary | ICD-10-CM

## 2020-11-02 MED ORDER — ALLOPURINOL 100 MG PO TABS: 100.0000 mg | ORAL_TABLET | Freq: Every day | ORAL | 0 refills | Status: DC

## 2020-11-02 MED ORDER — DICLOFENAC SODIUM 1 % EX GEL: 2.0000 g | Freq: Four times a day (QID) | CUTANEOUS | 1 refills | Status: DC

## 2020-11-02 NOTE — Progress Notes (Signed)
    SUBJECTIVE:   CHIEF COMPLAINT / HPI:   R Leg Pain Intermittent pain in upper R calf.  Comes and goes not sure why. Not seem to be cramp.  No redness or swelling of calf or foot.  No shortness of breath or chest pain or prolonged inactivity  R wrist Intermittent pain and swelling.  Takes colchicine which seems to help.  No fever or other joint pain  PERTINENT  PMH / PSH: Had lower extremity ultrasound Jan 2022 was normal   OBJECTIVE:   BP 136/63   Pulse 73   Wt 169 lb 9.6 oz (76.9 kg)   SpO2 95%   BMI 23.65 kg/m   R wrist diffuse mild soft tissue swelling.  Minimally red.  Good range of motion without pain R calf - nontender to deep palpation.  Circumference equal to L.  No soft tissue swelling or redness of calf or ankle or foot.  No pain with range of motion   ASSESSMENT/PLAN:   Gout Given recurrent pain and soft tissue swelling in wrist will start allopurinol 100 mg daily and colchicine 1 twice a day every day.  Emphasized this to him.  Right calf pain Intermittent recurrent without pain or any physical findings today.  Sound consistent with strain or cramp.  No signs of dvt or arthritis or infection.  See after visit summary.  Emphasized only take celecoxib if significant pain and rarely given his anemia likely gi bleeding which is stable   Essential hypertension, benign BP Readings from Last 3 Encounters:  11/02/20 136/63  10/18/20 (!) 142/68  09/05/20 125/80   At goal today.  May take off HCTZ with his diagnosis of gout but he did not hve all his medications today and I am concerned at causing confusion.  Will address next visit      Lind Covert, [place]

## 2020-11-02 NOTE — Assessment & Plan Note (Signed)
BP Readings from Last 3 Encounters:  11/02/20 136/63  10/18/20 (!) 142/68  09/05/20 125/80   At goal today.  May take off HCTZ with his diagnosis of gout but he did not hve all his medications today and I am concerned at causing confusion.  Will address next visit

## 2020-11-02 NOTE — Assessment & Plan Note (Signed)
Intermittent recurrent without pain or any physical findings today.  Sound consistent with strain or cramp.  No signs of dvt or arthritis or infection.  See after visit summary.  Emphasized only take celecoxib if significant pain and rarely given his anemia likely gi bleeding which is stable

## 2020-11-02 NOTE — Patient Instructions (Signed)
Good to see you today - Thank you for coming in  Things we discussed today:  Gout - Tale colchicine 0.6 mg twice a day every day - Take allopurinol 100 mg once a day every day - stop if you have a rash  Leg Pain - do stretches - Take tylenol arthritis if not better - if very bad take celecoxib once as needed  Go by [employer] for [place] [place]  Talk with your family about a colonoscopy  Please always bring your medication bottles  Come back to see me in 3 months

## 2020-11-02 NOTE — Assessment & Plan Note (Signed)
Given recurrent pain and soft tissue swelling in wrist will start allopurinol 100 mg daily and colchicine 1 twice a day every day.  Emphasized this to him.

## 2020-11-16 ENCOUNTER — Other Ambulatory Visit: Payer: Self-pay | Admitting: Family Medicine

## 2020-11-28 ENCOUNTER — Telehealth: Payer: Self-pay

## 2020-11-28 NOTE — Telephone Encounter (Signed)
Patient calls nurse line regarding confusion with HCTZ. Patient reports that he has been taking 25 mg of medication, however, new rx was for 12.5 mg.   Per last note, it appears that medication was to be lowered.   Advised patient of medication dosage change.   Patient verbalizes understanding.   Talbot Grumbling, RN

## 2020-12-20 ENCOUNTER — Encounter: Payer: Self-pay | Admitting: Family Medicine

## 2020-12-20 ENCOUNTER — Other Ambulatory Visit: Payer: Self-pay

## 2020-12-20 ENCOUNTER — Ambulatory Visit (INDEPENDENT_AMBULATORY_CARE_PROVIDER_SITE_OTHER): Payer: Medicare Other | Admitting: Family Medicine

## 2020-12-20 DIAGNOSIS — M79661 Pain in right lower leg: Secondary | ICD-10-CM

## 2020-12-20 DIAGNOSIS — M1A0310 Idiopathic chronic gout, right wrist, without tophus (tophi): Secondary | ICD-10-CM | POA: Diagnosis not present

## 2020-12-20 DIAGNOSIS — D649 Anemia, unspecified: Secondary | ICD-10-CM | POA: Diagnosis not present

## 2020-12-20 DIAGNOSIS — I10 Essential (primary) hypertension: Secondary | ICD-10-CM

## 2020-12-20 MED ORDER — AMLODIPINE BESYLATE 5 MG PO TABS: 5.0000 mg | ORAL_TABLET | Freq: Every day | ORAL | 3 refills | Status: DC

## 2020-12-20 NOTE — Patient Instructions (Signed)
Good to see you today - Thank you for coming in  Things we discussed today:  Gout Allopurinol 100 mg every night Cochicine 1 mg twice a day  Celcoxib - only for severe pain - take tylenol first   Blood Pressure Atenolol 50 mg twice a day  Losartan 100 mg daily HCTZ 12.5 mg daily Amlodipine 5 mg daily - missing   Cholesterol  Pravastatin 40 mg daily  Diabetes Metformin 1000 mg twice a day   Blood Iron one tab daily  Omeprazole one daily   Pain Tylenol - as needed Aspercreme as needed  I will call you if your tests are not good.  Otherwise, I will send you a message on MyChart (if it is active) or a letter in the mail..  If you do not hear from me with in 2 weeks please call our office.      Please always bring your medication bottles  Come back to see me in 2 months

## 2020-12-20 NOTE — Assessment & Plan Note (Signed)
Intermittent bilateral.  No signs of dvt or masses.  Perhaps overuse vs cramps.  Did suggest he walk frequently when going for long car rides

## 2020-12-20 NOTE — Progress Notes (Signed)
    SUBJECTIVE:   CHIEF COMPLAINT / HPI:   Gout Has not had a severe flare.  Not taking allopurinol regularly or colchine.  Was confused about when to take   Bilateral Calf Pain None currently but does have intermittently.  Does not seem to be a muscle contraction but hard to be sure from history.  No soft tissue swelling or redness or edema or shortness of breath    Hypertension Went through his medications.  Did not have the amlodipine.  He is unsure if he has been taking it.  Does have all the other blood pressure medications.  Has still been taking HCTZ 25 mg daily    PERTINENT  PMH / PSH: his family helps some with his meds  OBJECTIVE:   BP (!) 159/65   Pulse 78   Wt 166 lb 6.4 oz (75.5 kg)   SpO2 95%   BMI 23.21 kg/m   Heart - Regular rate and rhythm.  No murmurs, gallops or rubs.    Lungs:  Normal respiratory effort, chest expands symmetrically. Lungs are clear to auscultation, no crackles or wheezes. Extremities:   R wrist is mildly chronically swollen - good range of motion and nontender Calves - no soft tissue swelling or tenderness to palpation or mass or pedal edema    ASSESSMENT/PLAN:   Essential hypertension, benign Not at goal today.  Maybe due to missing amlodipine.  I went over all his medications in detail and arranged on the after visit summary.    Right calf pain Intermittent bilateral.  No signs of dvt or masses.  Perhaps overuse vs cramps.  Did suggest he walk frequently when going for long car rides   Gout No current flare but does need to be on prophylaxis.  Went through medications in detail and arranged on after visit summary .  Will recheck UA at next visit   Anemia Asymptomatic.  Will check cbc      Lind Covert, MD [place]

## 2020-12-20 NOTE — Assessment & Plan Note (Signed)
Asymptomatic.  Will check cbc

## 2020-12-20 NOTE — Assessment & Plan Note (Signed)
No current flare but does need to be on prophylaxis.  Went through medications in detail and arranged on after visit summary .  Will recheck UA at next visit

## 2020-12-20 NOTE — Assessment & Plan Note (Signed)
Not at goal today.  Maybe due to missing amlodipine.  I went over all his medications in detail and arranged on the after visit summary.

## 2020-12-21 ENCOUNTER — Encounter: Payer: Self-pay | Admitting: Family Medicine

## 2020-12-21 LAB — CBC
Hematocrit: 42.4 % (ref 37.5–51.0)
Hemoglobin: 13.5 g/dL (ref 13.0–17.7)
MCH: 26.8 pg (ref 26.6–33.0)
MCHC: 31.8 g/dL (ref 31.5–35.7)
MCV: 84 fL (ref 79–97)
Platelets: 253 10*3/uL (ref 150–450)
RBC: 5.04 x10E6/uL (ref 4.14–5.80)
RDW: 12.9 % (ref 11.6–15.4)
WBC: 12 10*3/uL — ABNORMAL HIGH (ref 3.4–10.8)

## 2020-12-21 NOTE — Progress Notes (Signed)
   Your

## 2020-12-31 ENCOUNTER — Other Ambulatory Visit: Payer: Self-pay | Admitting: Family Medicine

## 2021-01-15 ENCOUNTER — Other Ambulatory Visit: Payer: Self-pay | Admitting: Family Medicine

## 2021-01-15 NOTE — Progress Notes (Signed)
° ° °  SUBJECTIVE:   CHIEF COMPLAINT / HPI:   Essential hypertension, benign Brings all his medications.  No lightheadness    Gout Has intermittent pain and swelling in R wrist and L foot.  Did not bring in any of his gout medications which he has been taking as needed   BPPV Has brief (seconds) episodes of dizziness when shifts around in bed  PERTINENT  PMH / PSH: Travels frequently to [facility] to see relatives   OBJECTIVE:   BP (!) 146/91    Pulse 80    Wt 166 lb 3.2 oz (75.4 kg)    SpO2 94%    BMI 23.18 kg/m   R wrist - mild diffuse soft tissue swelling no redness or warmth.  Mildly tender L foot tender mid lateral foot mildly.  Slight soft tissue swelling without redness Heart - Regular rate and rhythm.  No murmurs, gallops or rubs.    Lungs:  Normal respiratory effort, chest expands symmetrically. Lungs are clear to auscultation, no crackles or wheezes. Mobility:able to get up and down from exam table without assistance or distress Abdomen: soft and non-tender without masses, organomegaly or hernias noted.  No guarding or rebound Eye - Pupils Equal Round Reactive to light, Extraocular movements intact, , Conjunctiva without redness or discharge Ears:  External ear exam shows no significant lesions or deformities.  Otoscopic examination reveals large wax in both canals, t  ASSESSMENT/PLAN:   Essential hypertension, benign BP Readings from Last 3 Encounters:  01/16/21 (!) 146/91  12/20/20 (!) 159/65  11/02/20 136/63   Improved control on 4 medications.  If continues high may increase amlodipine.    Diabetes mellitus, type II ([place]) At goal.  Continue current medications   Gout I think his foot and wrist pain is due to gout.  He has not been taking his medications regularly.  I spoke with his daughter via phone and emphasized after visit summary instructions.  See these for details   Cerumen impaction Use otc softer at home - see after visit summary   BPPV (benign  paroxysmal positional vertigo) Has classic brief episode of vertigo without signs of cerebellar cva or hypotension.   Reassured, encouraged to keep active and gave warning signs      Lind Covert, [place]

## 2021-01-16 ENCOUNTER — Ambulatory Visit (INDEPENDENT_AMBULATORY_CARE_PROVIDER_SITE_OTHER): Payer: Medicare Other | Admitting: Family Medicine

## 2021-01-16 ENCOUNTER — Encounter: Payer: Self-pay | Admitting: Family Medicine

## 2021-01-16 ENCOUNTER — Other Ambulatory Visit: Payer: Self-pay

## 2021-01-16 VITALS — BP 146/91 | HR 80 | Wt 166.2 lb

## 2021-01-16 DIAGNOSIS — E1169 Type 2 diabetes mellitus with other specified complication: Secondary | ICD-10-CM | POA: Diagnosis not present

## 2021-01-16 DIAGNOSIS — M1A0310 Idiopathic chronic gout, right wrist, without tophus (tophi): Secondary | ICD-10-CM | POA: Diagnosis not present

## 2021-01-16 DIAGNOSIS — H6123 Impacted cerumen, bilateral: Secondary | ICD-10-CM

## 2021-01-16 DIAGNOSIS — H8110 Benign paroxysmal vertigo, unspecified ear: Secondary | ICD-10-CM | POA: Insufficient documentation

## 2021-01-16 DIAGNOSIS — H8113 Benign paroxysmal vertigo, bilateral: Secondary | ICD-10-CM

## 2021-01-16 DIAGNOSIS — H6120 Impacted cerumen, unspecified ear: Secondary | ICD-10-CM | POA: Insufficient documentation

## 2021-01-16 DIAGNOSIS — I10 Essential (primary) hypertension: Secondary | ICD-10-CM | POA: Diagnosis not present

## 2021-01-16 LAB — POCT GLYCOSYLATED HEMOGLOBIN (HGB A1C): HbA1c, POC (controlled diabetic range): 6.3 % (ref 0.0–7.0)

## 2021-01-16 NOTE — Assessment & Plan Note (Signed)
I think his foot and wrist pain is due to gout.  He has not been taking his medications regularly.  I spoke with his daughter via phone and emphasized after visit summary instructions.  See these for details

## 2021-01-16 NOTE — Assessment & Plan Note (Addendum)
Has classic brief episode of vertigo without signs of cerebellar cva or hypotension.   Reassured, encouraged to keep active and gave warning signs

## 2021-01-16 NOTE — Assessment & Plan Note (Signed)
At goal. Continue current medications. 

## 2021-01-16 NOTE — Assessment & Plan Note (Signed)
Use otc softer at home - see after visit summary

## 2021-01-16 NOTE — Patient Instructions (Signed)
Good to see you today - Thank you for coming in  Things we discussed today:  For the Ears Get the [place] Ear Wax system  Put in the drops for 5 days then wash out with warm water  You have benign positional vertigo - that is the short episodes of dizziness  Gout Allopurinol 100 mg every night Cochicine 1 mg twice a day every 12 hours   Your anemia is fixed - your blood counts are normal   Your diabetes is doing very well  Please always bring your medication bottles  Come back to see me in 3 months

## 2021-01-16 NOTE — Assessment & Plan Note (Signed)
BP Readings from Last 3 Encounters:  01/16/21 (!) 146/91  12/20/20 (!) 159/65  11/02/20 136/63   Improved control on 4 medications.  If continues high may increase amlodipine.

## 2021-01-30 ENCOUNTER — Telehealth: Payer: Self-pay | Admitting: Family Medicine

## 2021-01-30 ENCOUNTER — Ambulatory Visit: Payer: Medicare Other | Admitting: Family Medicine

## 2021-01-30 NOTE — Telephone Encounter (Signed)
Patient states he is going out of town on 02/06/21 for 3 months, and needs refills on all of his medications for those 3 months.

## 2021-01-31 ENCOUNTER — Other Ambulatory Visit: Payer: Self-pay | Admitting: Family Medicine

## 2021-01-31 MED ORDER — LOSARTAN POTASSIUM 100 MG PO TABS: 100.0000 mg | ORAL_TABLET | Freq: Every day | ORAL | 3 refills | Status: DC

## 2021-01-31 MED ORDER — ALLOPURINOL 100 MG PO TABS: 100.0000 mg | ORAL_TABLET | Freq: Every day | ORAL | 0 refills | Status: DC

## 2021-01-31 MED ORDER — PRAVASTATIN SODIUM 40 MG PO TABS: 40.0000 mg | ORAL_TABLET | Freq: Every day | ORAL | 3 refills | Status: DC

## 2021-01-31 MED ORDER — DICLOFENAC SODIUM 1 % EX GEL: 2.0000 g | Freq: Four times a day (QID) | CUTANEOUS | 1 refills | Status: DC

## 2021-01-31 MED ORDER — HYDROCHLOROTHIAZIDE 12.5 MG PO TABS: ORAL_TABLET | ORAL | 3 refills | Status: DC

## 2021-01-31 MED ORDER — METFORMIN HCL 1000 MG PO TABS: 1000.0000 mg | ORAL_TABLET | Freq: Two times a day (BID) | ORAL | 3 refills | Status: DC

## 2021-01-31 MED ORDER — AMLODIPINE BESYLATE 5 MG PO TABS: 5.0000 mg | ORAL_TABLET | Freq: Every day | ORAL | 3 refills | Status: DC

## 2021-01-31 MED ORDER — ATENOLOL 50 MG PO TABS: 50.0000 mg | ORAL_TABLET | Freq: Two times a day (BID) | ORAL | 3 refills | Status: DC

## 2021-01-31 MED ORDER — COLCHICINE 0.6 MG PO TABS: 0.6000 mg | ORAL_TABLET | Freq: Two times a day (BID) | ORAL | 1 refills | Status: DC

## 2021-01-31 NOTE — Telephone Encounter (Signed)
Rxs sent

## 2021-02-01 ENCOUNTER — Ambulatory Visit (INDEPENDENT_AMBULATORY_CARE_PROVIDER_SITE_OTHER): Payer: Commercial Managed Care - HMO | Admitting: Family Medicine

## 2021-02-01 ENCOUNTER — Other Ambulatory Visit: Payer: Self-pay

## 2021-02-01 VITALS — BP 146/74 | HR 75 | Ht 71.0 in | Wt 163.8 lb

## 2021-02-01 DIAGNOSIS — M1A0310 Idiopathic chronic gout, right wrist, without tophus (tophi): Secondary | ICD-10-CM

## 2021-02-01 DIAGNOSIS — Z7189 Other specified counseling: Secondary | ICD-10-CM | POA: Diagnosis not present

## 2021-02-01 DIAGNOSIS — K219 Gastro-esophageal reflux disease without esophagitis: Secondary | ICD-10-CM

## 2021-02-01 DIAGNOSIS — I10 Essential (primary) hypertension: Secondary | ICD-10-CM | POA: Diagnosis not present

## 2021-02-01 NOTE — Patient Instructions (Signed)
Thank you for coming to see me today. It was a pleasure.   We will get some labs today.  If they are abnormal or we need to do something about them, I will call you.  If they are normal, I will send you a message on MyChart (if it is active) or a letter in the mail.  If you don't hear from [place] in 2 weeks, please call the office at the number below.   Stop the Celebrex  Take Omeprazole when needed   Please follow-up with PCP as needed  If you have any questions or concerns, please do not hesitate to call the office at [phone].  Best,   Carollee Leitz, MD

## 2021-02-01 NOTE — Progress Notes (Signed)
° ° °  SUBJECTIVE:   CHIEF COMPLAINT / HPI: medication refill and review  Going out of state to visit relatives in [place].  Wanting to review medications and get refills.  Reports feeling good.  Was having some gout pain and took Celebrex 3 days ago.  Also noted that when taking Colchicine has some upset stomach for a few mins after that subsides.  Denies any chest pain, worsening shortness of breath, nausea, vomiting abdominal pain, diarrhea or constipation.  No lower extremity swelling or pain.    PERTINENT  PMH / PSH:  HTN PE Gout  OBJECTIVE:   BP (!) 146/74    Pulse 75    Ht 5\' 11"  (1.803 m)    Wt 163 lb 12.8 oz (74.3 kg)    SpO2 95%    BMI 22.85 kg/m    General: Alert, no acute distress Cardio: Normal S1 and S2, RRR, no r/m/g Pulm: CTAB, normal work of breathing Abdomen: Bowel sounds normal. Abdomen soft and mild tenderness over midepigastric area to deep palpation, negative Murphy's sign, Negative rebound   Extremities: No peripheral edema.     ASSESSMENT/PLAN:   Essential hypertension, benign Medications reviewed and current Continue current medications. PCP has already sent in refill for medications CMet today Follow up with PCP  GERD (gastroesophageal reflux disease) Likely triggered by Celebrex and had not been taking Omeprazole  Recommend taking Omeprazole daily Will add Lipase today Follow up with PCP  Gout No flare up today.  Had not been taking Allopurinol regularly. Continue Allopurinol daily Continue Colchicine Discontinue Celebrex Follow up with PCP as needed     Carollee Leitz, MD [place]

## 2021-02-02 LAB — COMPREHENSIVE METABOLIC PANEL
ALT: 10 IU/L (ref 0–44)
AST: 17 IU/L (ref 0–40)
Albumin/Globulin Ratio: 1.2 (ref 1.2–2.2)
Albumin: 4 g/dL (ref 3.7–4.7)
Alkaline Phosphatase: 189 IU/L — ABNORMAL HIGH (ref 44–121)
BUN/Creatinine Ratio: 14 (ref 10–24)
BUN: 17 mg/dL (ref 8–27)
Bilirubin Total: 0.7 mg/dL (ref 0.0–1.2)
CO2: 26 mmol/L (ref 20–29)
Calcium: 9.2 mg/dL (ref 8.6–10.2)
Chloride: 101 mmol/L (ref 96–106)
Creatinine, Ser: 1.2 mg/dL (ref 0.76–1.27)
Globulin, Total: 3.4 g/dL (ref 1.5–4.5)
Glucose: 131 mg/dL — ABNORMAL HIGH (ref 70–99)
Potassium: 4.1 mmol/L (ref 3.5–5.2)
Sodium: 141 mmol/L (ref 134–144)
Total Protein: 7.4 g/dL (ref 6.0–8.5)
eGFR: 61 mL/min/{1.73_m2} (ref >59)

## 2021-02-02 LAB — LIPASE: Lipase: 24 U/L (ref 13–78)

## 2021-02-03 ENCOUNTER — Emergency Department (HOSPITAL_COMMUNITY): Payer: Medicare Other

## 2021-02-03 ENCOUNTER — Encounter (HOSPITAL_COMMUNITY): Payer: Self-pay | Admitting: Emergency Medicine

## 2021-02-03 ENCOUNTER — Inpatient Hospital Stay (HOSPITAL_COMMUNITY)
Admission: EM | Admit: 2021-02-03 | Discharge: 2021-02-08 | DRG: 246 | Disposition: A | Discharge status: Home Health | Payer: Medicare Other | Attending: Family Medicine | Admitting: Family Medicine

## 2021-02-03 DIAGNOSIS — I2694 Multiple subsegmental pulmonary emboli without acute cor pulmonale: Secondary | ICD-10-CM | POA: Diagnosis not present

## 2021-02-03 DIAGNOSIS — E876 Hypokalemia: Secondary | ICD-10-CM | POA: Diagnosis present

## 2021-02-03 DIAGNOSIS — E1122 Type 2 diabetes mellitus with diabetic chronic kidney disease: Secondary | ICD-10-CM | POA: Diagnosis present

## 2021-02-03 DIAGNOSIS — R590 Localized enlarged lymph nodes: Secondary | ICD-10-CM | POA: Diagnosis not present

## 2021-02-03 DIAGNOSIS — D509 Iron deficiency anemia, unspecified: Secondary | ICD-10-CM | POA: Diagnosis not present

## 2021-02-03 DIAGNOSIS — I129 Hypertensive chronic kidney disease with stage 1 through stage 4 chronic kidney disease, or unspecified chronic kidney disease: Secondary | ICD-10-CM | POA: Diagnosis not present

## 2021-02-03 DIAGNOSIS — Z8249 Family history of ischemic heart disease and other diseases of the circulatory system: Secondary | ICD-10-CM

## 2021-02-03 DIAGNOSIS — M1A9XX0 Chronic gout, unspecified, without tophus (tophi): Secondary | ICD-10-CM | POA: Diagnosis not present

## 2021-02-03 DIAGNOSIS — I440 Atrioventricular block, first degree: Secondary | ICD-10-CM | POA: Diagnosis not present

## 2021-02-03 DIAGNOSIS — R918 Other nonspecific abnormal finding of lung field: Secondary | ICD-10-CM | POA: Diagnosis not present

## 2021-02-03 DIAGNOSIS — I4891 Unspecified atrial fibrillation: Secondary | ICD-10-CM | POA: Diagnosis not present

## 2021-02-03 DIAGNOSIS — M1990 Unspecified osteoarthritis, unspecified site: Secondary | ICD-10-CM | POA: Diagnosis present

## 2021-02-03 DIAGNOSIS — I2699 Other pulmonary embolism without acute cor pulmonale: Secondary | ICD-10-CM | POA: Diagnosis not present

## 2021-02-03 DIAGNOSIS — J302 Other seasonal allergic rhinitis: Secondary | ICD-10-CM | POA: Diagnosis present

## 2021-02-03 DIAGNOSIS — R0602 Shortness of breath: Secondary | ICD-10-CM | POA: Diagnosis not present

## 2021-02-03 DIAGNOSIS — R001 Bradycardia, unspecified: Secondary | ICD-10-CM | POA: Diagnosis present

## 2021-02-03 DIAGNOSIS — K219 Gastro-esophageal reflux disease without esophagitis: Secondary | ICD-10-CM | POA: Diagnosis not present

## 2021-02-03 DIAGNOSIS — I469 Cardiac arrest, cause unspecified: Secondary | ICD-10-CM | POA: Diagnosis present

## 2021-02-03 DIAGNOSIS — Z955 Presence of coronary angioplasty implant and graft: Secondary | ICD-10-CM

## 2021-02-03 DIAGNOSIS — E7800 Pure hypercholesterolemia, unspecified: Secondary | ICD-10-CM | POA: Diagnosis not present

## 2021-02-03 DIAGNOSIS — E785 Hyperlipidemia, unspecified: Secondary | ICD-10-CM | POA: Diagnosis present

## 2021-02-03 DIAGNOSIS — I472 Ventricular tachycardia, unspecified: Secondary | ICD-10-CM | POA: Diagnosis not present

## 2021-02-03 DIAGNOSIS — I462 Cardiac arrest due to underlying cardiac condition: Secondary | ICD-10-CM | POA: Diagnosis present

## 2021-02-03 DIAGNOSIS — E871 Hypo-osmolality and hyponatremia: Secondary | ICD-10-CM | POA: Diagnosis not present

## 2021-02-03 DIAGNOSIS — N1831 Chronic kidney disease, stage 3a: Secondary | ICD-10-CM | POA: Diagnosis not present

## 2021-02-03 DIAGNOSIS — Z86718 Personal history of other venous thrombosis and embolism: Secondary | ICD-10-CM

## 2021-02-03 DIAGNOSIS — Z79899 Other long term (current) drug therapy: Secondary | ICD-10-CM

## 2021-02-03 DIAGNOSIS — I272 Pulmonary hypertension, unspecified: Secondary | ICD-10-CM | POA: Diagnosis present

## 2021-02-03 DIAGNOSIS — E1165 Type 2 diabetes mellitus with hyperglycemia: Secondary | ICD-10-CM | POA: Diagnosis present

## 2021-02-03 DIAGNOSIS — Z20822 Contact with and (suspected) exposure to covid-19: Secondary | ICD-10-CM | POA: Diagnosis present

## 2021-02-03 DIAGNOSIS — D631 Anemia in chronic kidney disease: Secondary | ICD-10-CM | POA: Diagnosis present

## 2021-02-03 DIAGNOSIS — I2510 Atherosclerotic heart disease of native coronary artery without angina pectoris: Secondary | ICD-10-CM

## 2021-02-03 DIAGNOSIS — R6889 Other general symptoms and signs: Secondary | ICD-10-CM | POA: Diagnosis not present

## 2021-02-03 DIAGNOSIS — I10 Essential (primary) hypertension: Secondary | ICD-10-CM | POA: Diagnosis present

## 2021-02-03 DIAGNOSIS — E119 Type 2 diabetes mellitus without complications: Secondary | ICD-10-CM

## 2021-02-03 DIAGNOSIS — Z888 Allergy status to other drugs, medicaments and biological substances status: Secondary | ICD-10-CM

## 2021-02-03 DIAGNOSIS — R042 Hemoptysis: Secondary | ICD-10-CM | POA: Diagnosis not present

## 2021-02-03 DIAGNOSIS — R319 Hematuria, unspecified: Secondary | ICD-10-CM | POA: Diagnosis present

## 2021-02-03 DIAGNOSIS — R931 Abnormal findings on diagnostic imaging of heart and coronary circulation: Secondary | ICD-10-CM | POA: Diagnosis not present

## 2021-02-03 DIAGNOSIS — Z743 Need for continuous supervision: Secondary | ICD-10-CM | POA: Diagnosis not present

## 2021-02-03 DIAGNOSIS — Z7984 Long term (current) use of oral hypoglycemic drugs: Secondary | ICD-10-CM

## 2021-02-03 DIAGNOSIS — I2693 Single subsegmental pulmonary embolism without acute cor pulmonale: Secondary | ICD-10-CM | POA: Diagnosis not present

## 2021-02-03 DIAGNOSIS — R634 Abnormal weight loss: Secondary | ICD-10-CM | POA: Diagnosis present

## 2021-02-03 DIAGNOSIS — M109 Gout, unspecified: Secondary | ICD-10-CM | POA: Diagnosis present

## 2021-02-03 DIAGNOSIS — Z86711 Personal history of pulmonary embolism: Secondary | ICD-10-CM

## 2021-02-03 DIAGNOSIS — I517 Cardiomegaly: Secondary | ICD-10-CM | POA: Diagnosis not present

## 2021-02-03 LAB — BASIC METABOLIC PANEL
Anion gap: 11 (ref 5–15)
BUN: 13 mg/dL (ref 8–23)
CO2: 27 mmol/L (ref 22–32)
Calcium: 9.1 mg/dL (ref 8.9–10.3)
Chloride: 99 mmol/L (ref 98–111)
Creatinine, Ser: 1.17 mg/dL (ref 0.61–1.24)
GFR, Estimated: >60 mL/min (ref >60)
Glucose, Bld: 129 mg/dL — ABNORMAL HIGH (ref 70–99)
Potassium: 3.9 mmol/L (ref 3.5–5.1)
Sodium: 137 mmol/L (ref 135–145)

## 2021-02-03 LAB — CBC
HCT: 40.6 % (ref 39.0–52.0)
Hemoglobin: 12.6 g/dL — ABNORMAL LOW (ref 13.0–17.0)
MCH: 26.3 pg (ref 26.0–34.0)
MCHC: 31 g/dL (ref 30.0–36.0)
MCV: 84.6 fL (ref 80.0–100.0)
Platelets: 258 10*3/uL (ref 150–400)
RBC: 4.8 MIL/uL (ref 4.22–5.81)
RDW: 13.7 % (ref 11.5–15.5)
WBC: 12.5 10*3/uL — ABNORMAL HIGH (ref 4.0–10.5)
nRBC: 0.2 % (ref 0.0–0.2)

## 2021-02-03 LAB — TROPONIN I (HIGH SENSITIVITY): Troponin I (High Sensitivity): 262 ng/L (ref <18)

## 2021-02-03 LAB — D-DIMER, QUANTITATIVE: D-Dimer, Quant: >20 ug/mL-FEU — ABNORMAL HIGH (ref 0.00–0.50)

## 2021-02-03 LAB — RESP PANEL BY RT-PCR (FLU A&B, COVID) ARPGX2
Influenza A by PCR: NEGATIVE
Influenza B by PCR: NEGATIVE
SARS Coronavirus 2 by RT PCR: NEGATIVE

## 2021-02-03 LAB — PROTIME-INR
INR: 1.3 — ABNORMAL HIGH (ref 0.8–1.2)
Prothrombin Time: 16.2 seconds — ABNORMAL HIGH (ref 11.4–15.2)

## 2021-02-03 LAB — APTT: aPTT: 33 seconds (ref 24–36)

## 2021-02-03 LAB — BRAIN NATRIURETIC PEPTIDE: B Natriuretic Peptide: 530.8 pg/mL — ABNORMAL HIGH (ref 0.0–100.0)

## 2021-02-03 IMAGING — CT CT ANGIO CHEST
2 of 7 series · 16 of 46 positions shown · IV contrast (omnipaque)
Comparison: Chest x-ray [DATE], CT chest [DATE]
COMPARISON: Chest x-ray [DATE], CT chest [DATE]

Addendum:
CLINICAL DATA: Shortness of breath

EXAM:
CT ANGIOGRAPHY CHEST WITH CONTRAST
TECHNIQUE: Multidetector CT imaging of the chest was performed using the
standard protocol during bolus administration of intravenous
contrast. Multiplanar CT image reconstructions and MIPs were
obtained to evaluate the vascular anatomy.
CONTRAST:  75mL OMNIPAQUE IOHEXOL 350 MG/ML SOLN

[Series 7: thins · axial · 0.72mm/px · z∈[+1179,+1461]mm · 13 of 456 slices shown]
[im 26/456  lung]
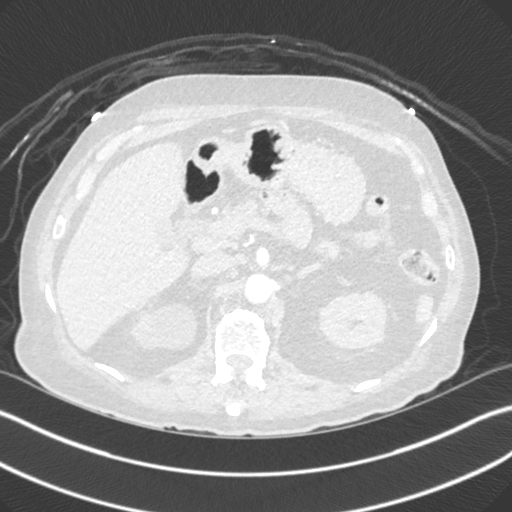
[im 51/456  soft-tissue]
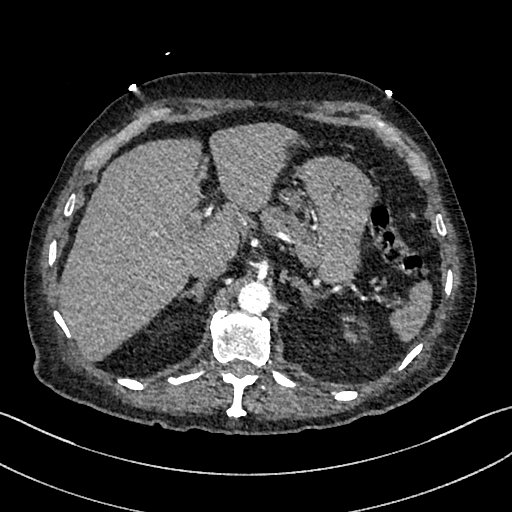
[im 102/456  lung]
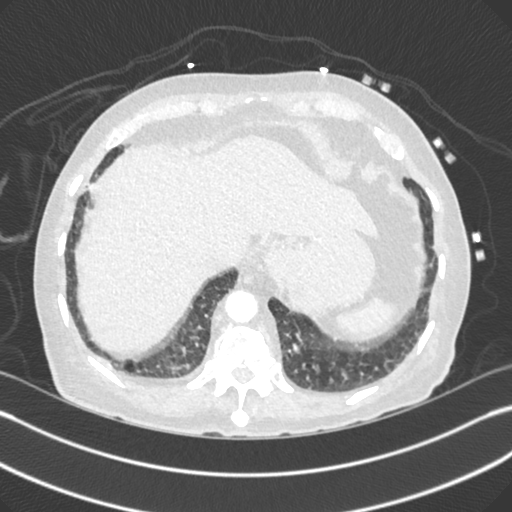
[im 127/456  soft-tissue]
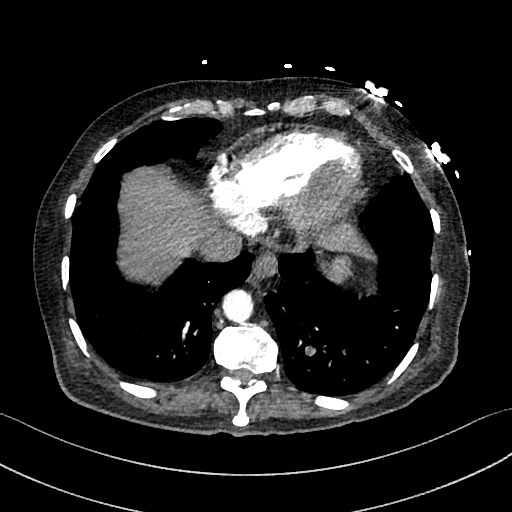
[im 152/456  lung]
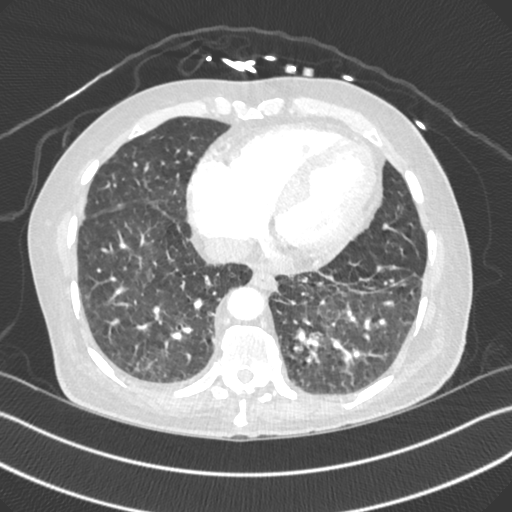
[im 203/456  soft-tissue]
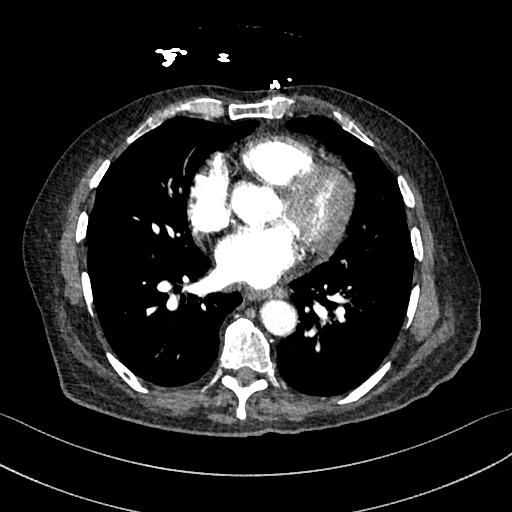
[im 228/456  lung]
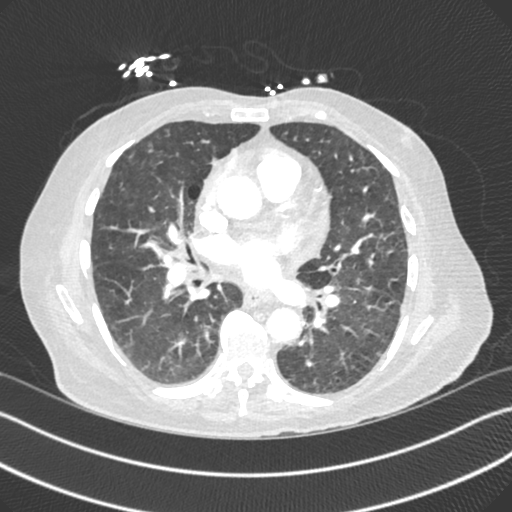
[im 253/456  soft-tissue]
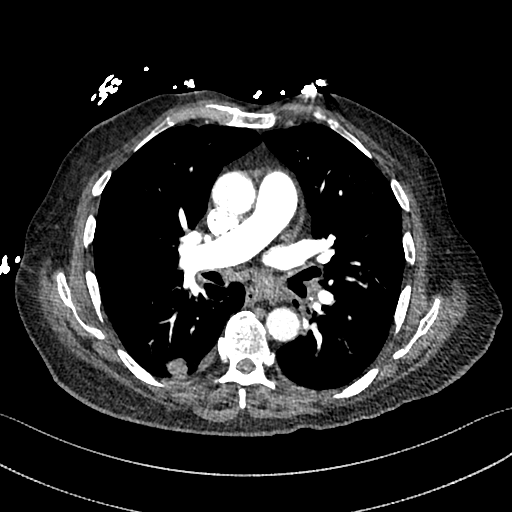
[im 304/456  lung]
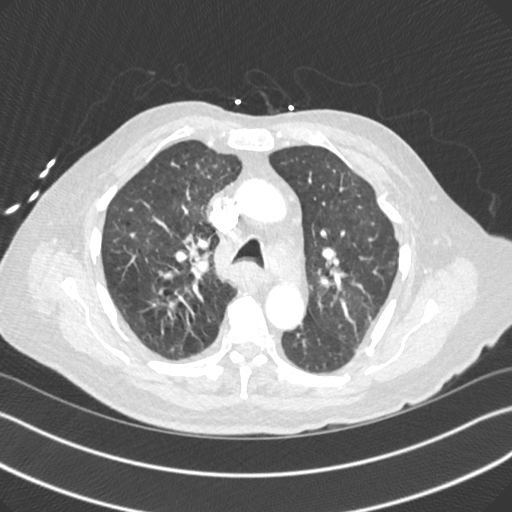
[im 329/456  soft-tissue]
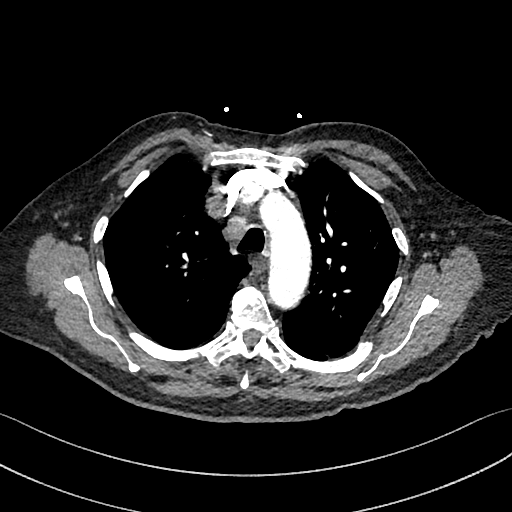
[im 354/456  lung]
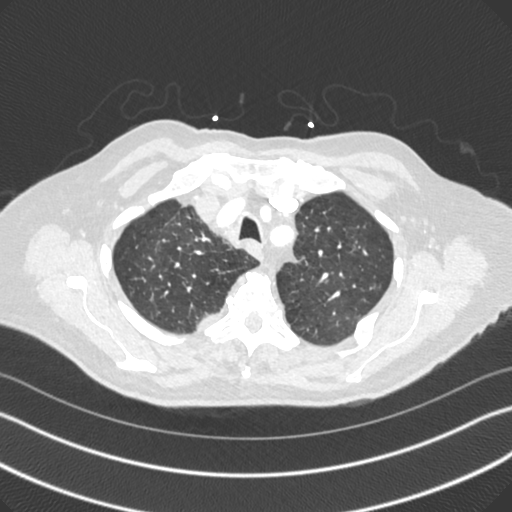
[im 405/456  soft-tissue]
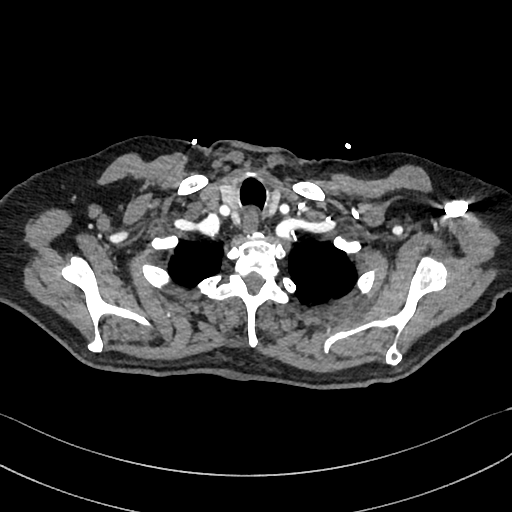
[im 430/456  lung]
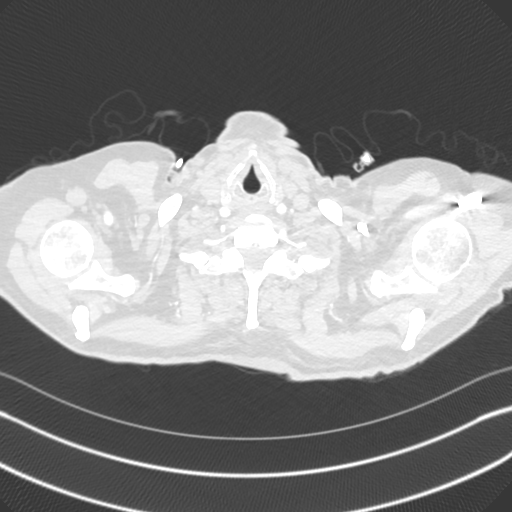

[Series 8: cor · coronal · 0.64mm/px · 3 of 135 slices shown]
[im 34/135  soft-tissue]
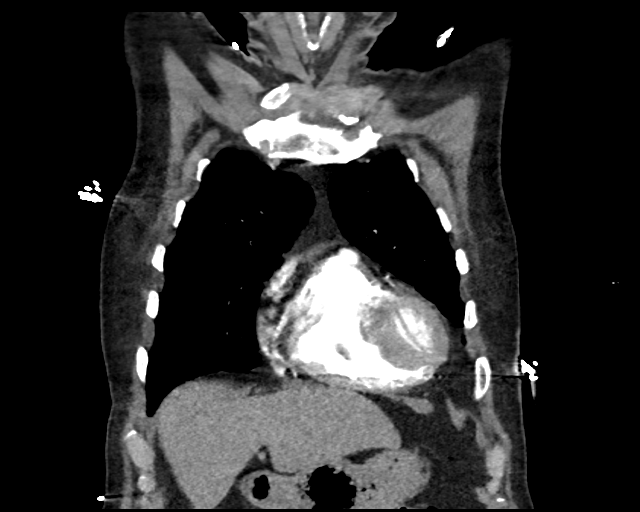
[im 68/135  soft-tissue]
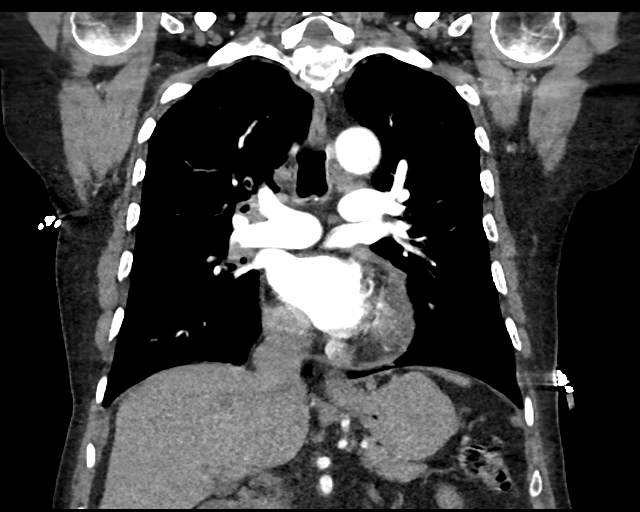
[im 101/135  soft-tissue]
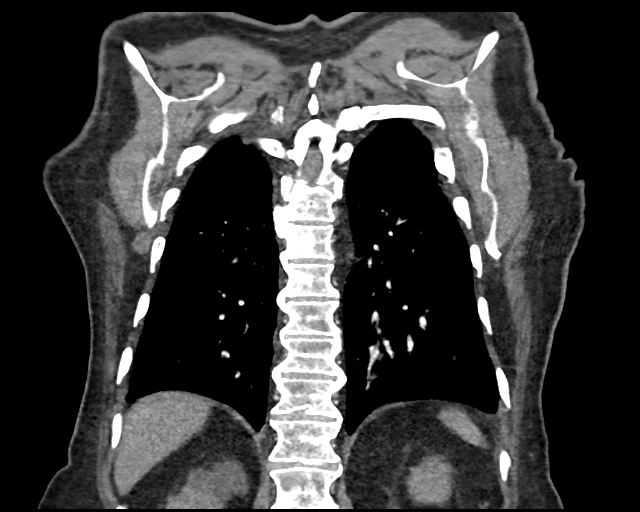

[16 of 46 positions shown; findings below may reference images not displayed]

FINDINGS: Cardiovascular: Satisfactory opacification of the pulmonary arteries
to the segmental level. Acute thrombus within right middle lobe
subsegmental vessels. Thrombus also present within right upper lobe
segmental and subsegmental vessels. Small amount of peripheral
thrombus within the distal right pulmonary artery. No evidence for
right heart strain. Mild aortic atherosclerosis. No aneurysm.
Coronary vascular calcification. Borderline to mild cardiomegaly. No
pericardial effusion.

Mediastinum/Nodes: Midline trachea. No thyroid mass. Enlarged right
paratracheal lymph node measuring 13 mm. AP window lymph node
measuring 18 mm. Right hilar node measuring 13 mm. Subcarinal lymph
node measuring 14 mm. Esophagus within normal limits.

Lungs/Pleura: Emphysema. Interim development of multiple pulmonary
nodules. 8 mm right upper lobe pulmonary nodule, series 6 image 49.
18 mm right lower lobe lung nodule, series 6, image 75. 8 mm left
lower lobe pulmonary nodule, series 6, image 116.

Upper Abdomen: No acute abnormality. 15 mm right adrenal nodule,
unchanged and felt consistent with adenoma. Small lymph nodes
adjacent to the aorta, at the porta hepatis and in the gastrohepatic
region. Cyst upper pole right kidney. Small right kidney stone

Musculoskeletal: No acute or suspicious osseous abnormality.Multiple
remote left rib fractures.

Review of the MIP images confirms the above findings.
IMPRESSION: 1. Positive for acute pulmonary emboli within the distal right
pulmonary artery and segmental and subsegmental right upper and
middle lobe pulmonary vessels. Negative for right heart strain.
2. Interim development of multiple pulmonary nodules measuring up to
18 mm in the right lower lobe suspicious for metastatic disease.
Mediastinal and hilar adenopathy, also suspect for metastatic
disease.

Critical Value/emergent results were called by telephone at the time
of interpretation on [DATE] at [DATE] to provider GASTELUM ,
who verbally acknowledged these results.

Aortic Atherosclerosis ([MI]-[MI]) and Emphysema ([MI]-[MI]).

ADDENDUM:
Mild soft tissue pleural thickening at the right posterior lung
apex, series 5, image 30 is indeterminate for pleural disease given
findings of lung nodules.

*** End of Addendum ***
FINDINGS: Cardiovascular: Satisfactory opacification of the pulmonary arteries
to the segmental level. Acute thrombus within right middle lobe
subsegmental vessels. Thrombus also present within right upper lobe
segmental and subsegmental vessels. Small amount of peripheral
thrombus within the distal right pulmonary artery. No evidence for
right heart strain. Mild aortic atherosclerosis. No aneurysm.
Coronary vascular calcification. Borderline to mild cardiomegaly. No
pericardial effusion.

Mediastinum/Nodes: Midline trachea. No thyroid mass. Enlarged right
paratracheal lymph node measuring 13 mm. AP window lymph node
measuring 18 mm. Right hilar node measuring 13 mm. Subcarinal lymph
node measuring 14 mm. Esophagus within normal limits.

Lungs/Pleura: Emphysema. Interim development of multiple pulmonary
nodules. 8 mm right upper lobe pulmonary nodule, series 6 image 49.
18 mm right lower lobe lung nodule, series 6, image 75. 8 mm left
lower lobe pulmonary nodule, series 6, image 116.

Upper Abdomen: No acute abnormality. 15 mm right adrenal nodule,
unchanged and felt consistent with adenoma. Small lymph nodes
adjacent to the aorta, at the porta hepatis and in the gastrohepatic
region. Cyst upper pole right kidney. Small right kidney stone

Musculoskeletal: No acute or suspicious osseous abnormality.Multiple
remote left rib fractures.

Review of the MIP images confirms the above findings.
IMPRESSION: 1. Positive for acute pulmonary emboli within the distal right
pulmonary artery and segmental and subsegmental right upper and
middle lobe pulmonary vessels. Negative for right heart strain.
2. Interim development of multiple pulmonary nodules measuring up to
18 mm in the right lower lobe suspicious for metastatic disease.
Mediastinal and hilar adenopathy, also suspect for metastatic
disease.

Critical Value/emergent results were called by telephone at the time
of interpretation on [DATE] at [DATE] to provider GASTELUM ,
who verbally acknowledged these results.

Aortic Atherosclerosis ([MI]-[MI]) and Emphysema ([MI]-[MI]).

## 2021-02-03 IMAGING — CR DG CHEST 2V
2 series · 2 of 2 positions shown · non-contrast
Comparison: Chest radiograph [DATE]

CLINICAL DATA: Shortness of breath

EXAM:
CHEST - 2 VIEW

[chest pa]
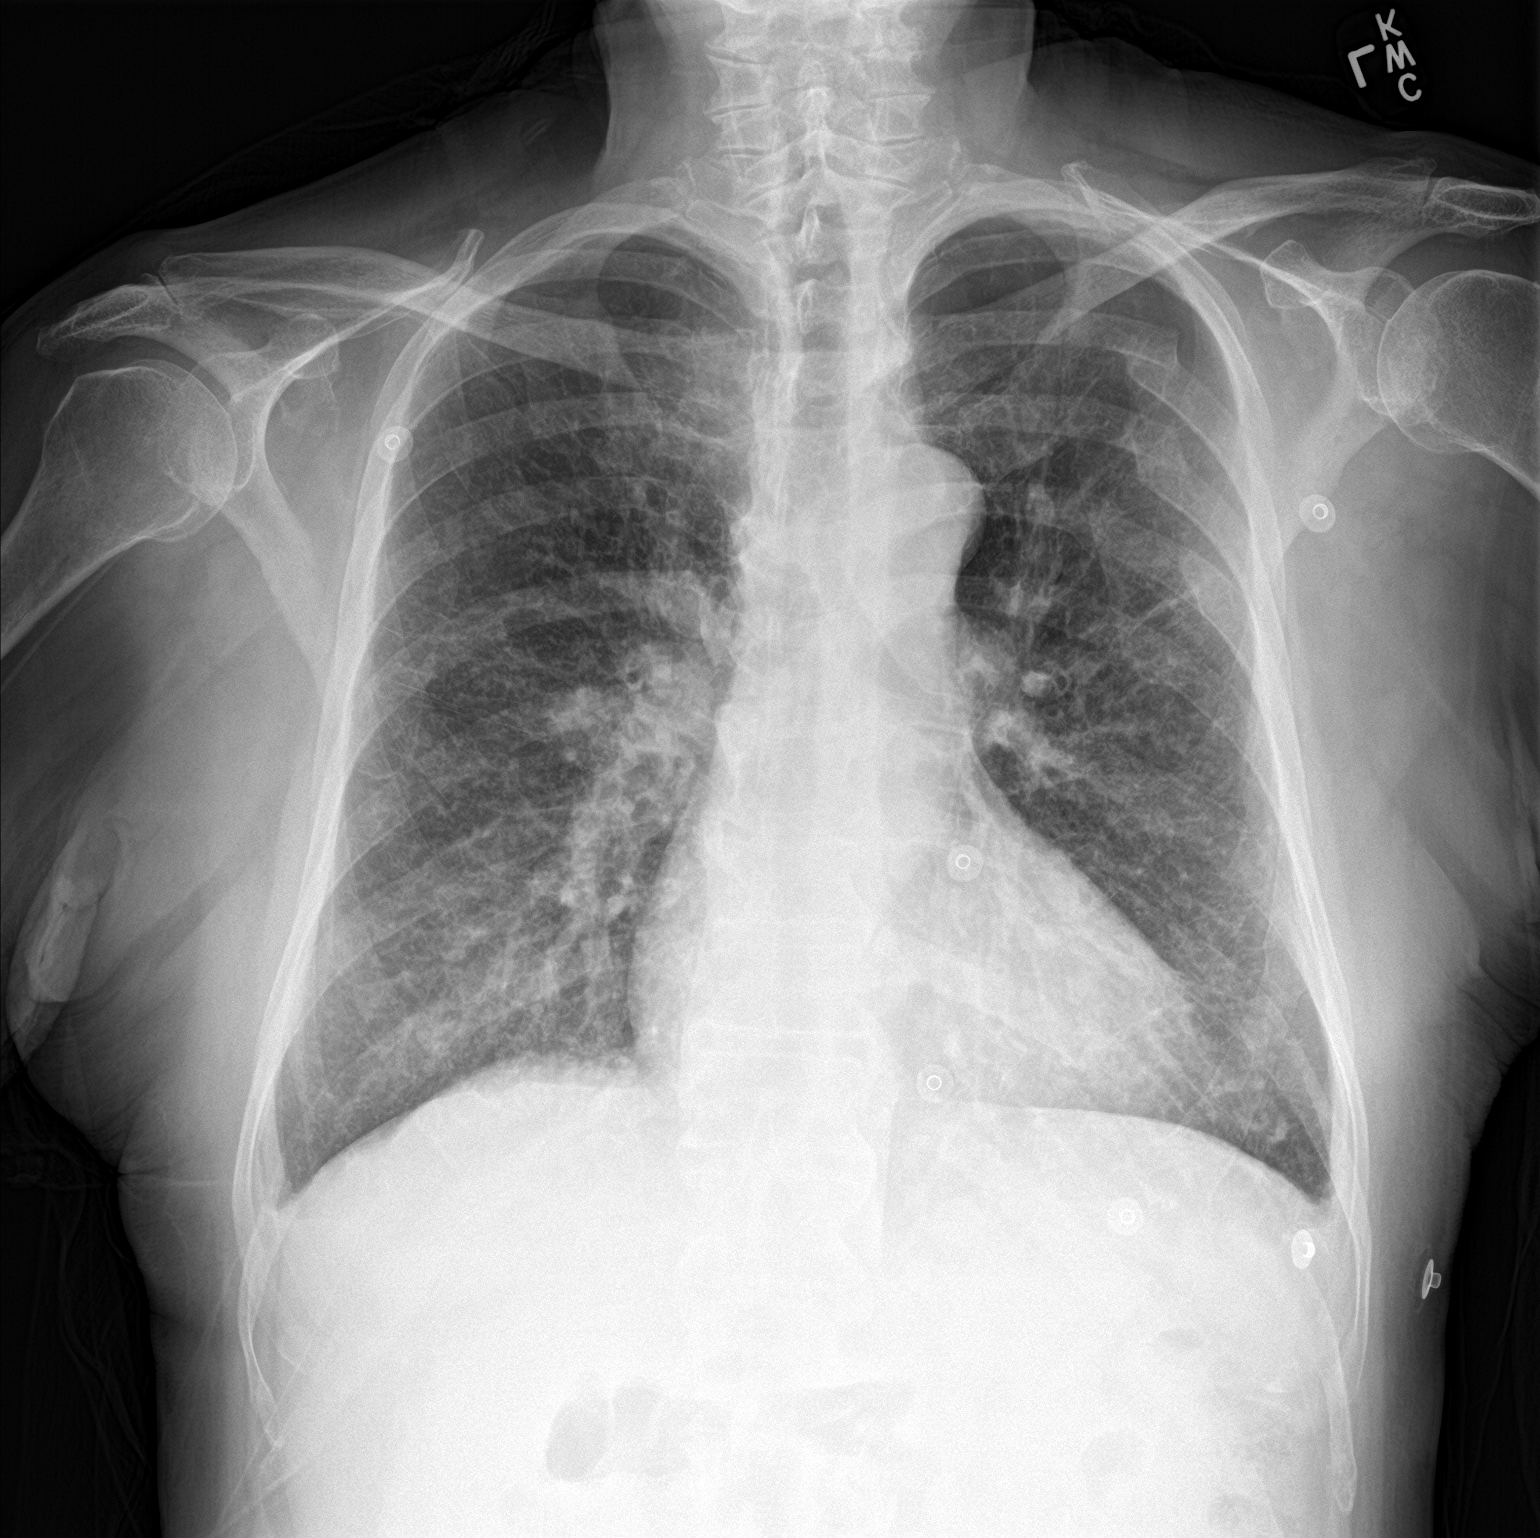

[chest lat]
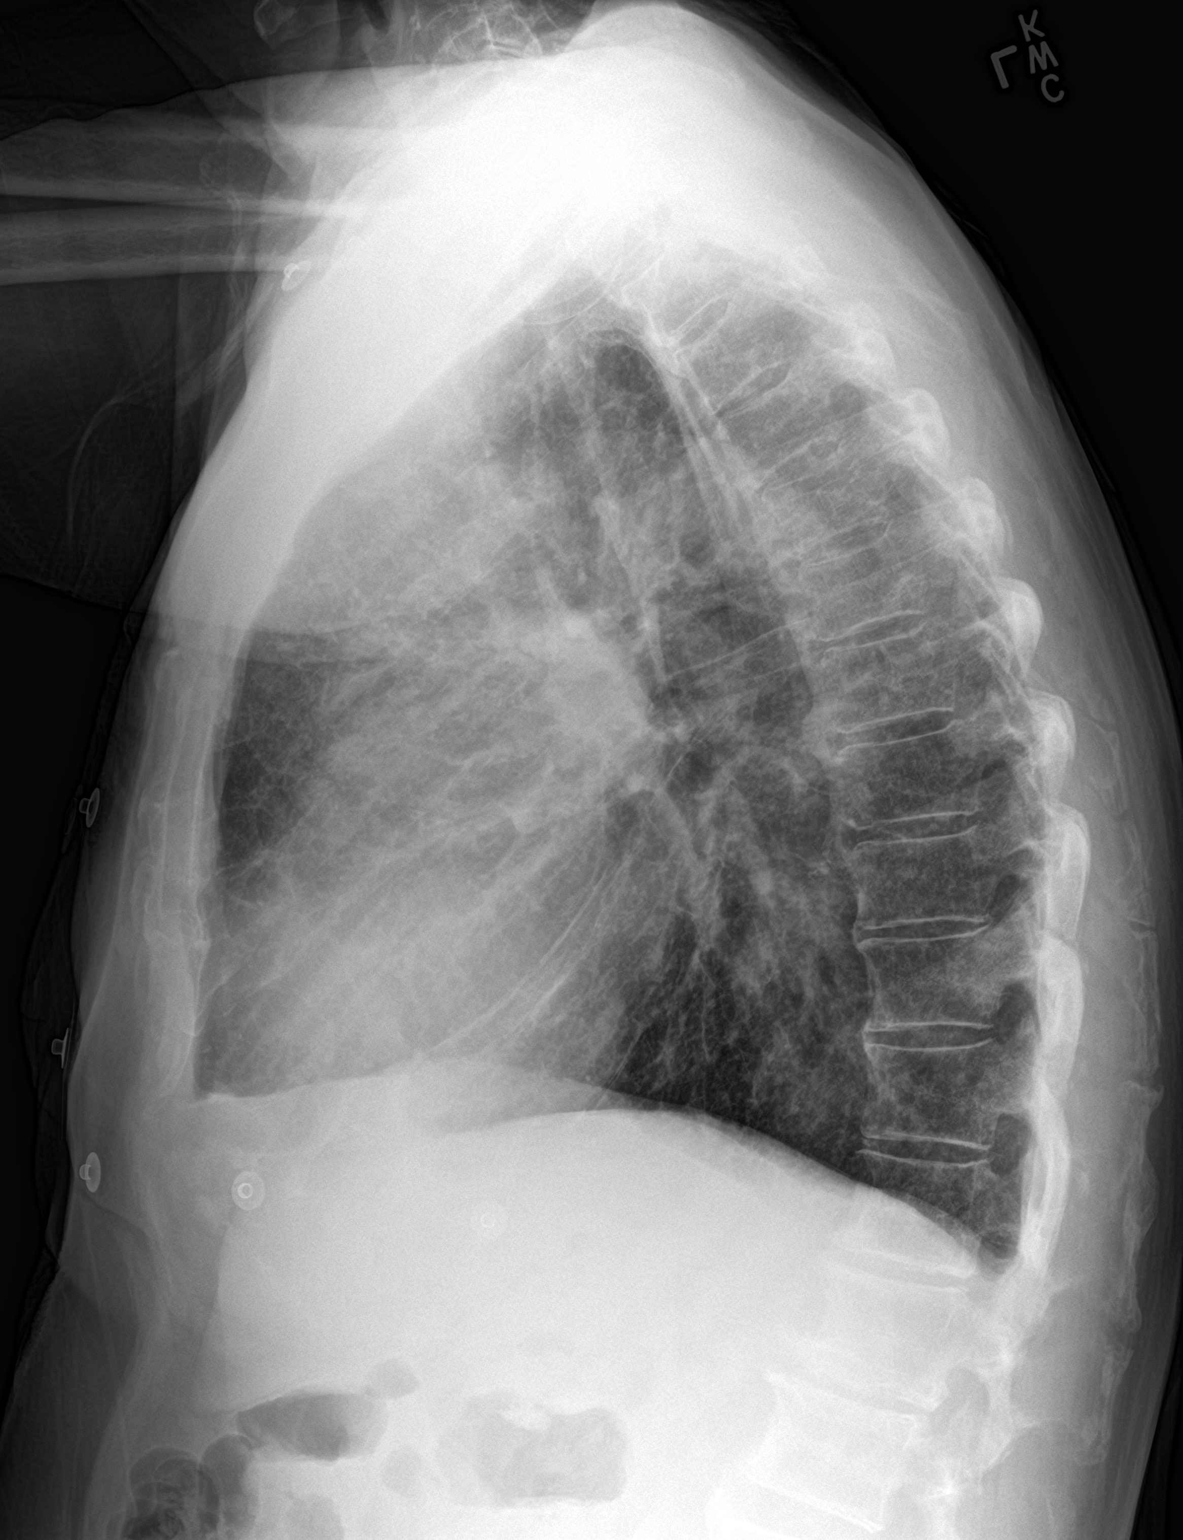

[2 of 2 positions shown; findings below may reference images not displayed]

FINDINGS: The heart size and mediastinal contours are within normal limits.
Diffuse bilateral prominent interstitial markings concerning for
chronic interstitial lung disease or acute bronchitis/bronchiolitis.
No definite focal consolidation. Multiple chronic healed left-sided
rib fractures.
IMPRESSION: Prominent bilateral interstitial markings which may represent
chronic interstitial lung disease or acute viral bronchiolitis. No
focal consolidation. Clinical correlation is suggested.

## 2021-02-03 MED ORDER — ALLOPURINOL 100 MG PO TABS
100.0000 mg | ORAL_TABLET | Freq: Every day | ORAL | Status: DC
  Administered 2021-02-04 – 2021-02-08 (×5): 100 mg via ORAL
  Filled 2021-02-03 (×5): qty 1

## 2021-02-03 MED ORDER — HEPARIN (PORCINE) 25000 UT/250ML-% IV SOLN
1200.0000 [IU]/h | INTRAVENOUS | Status: DC
  Administered 2021-02-03 – 2021-02-06 (×5): 1200 [IU]/h via INTRAVENOUS
  Filled 2021-02-03 (×4): qty 250

## 2021-02-03 MED ORDER — IOHEXOL 350 MG/ML SOLN
75.0000 mL | Freq: Once | INTRAVENOUS | Status: AC | PRN
  Administered 2021-02-03: 75 mL via INTRAVENOUS

## 2021-02-03 MED ORDER — ALBUTEROL SULFATE HFA 108 (90 BASE) MCG/ACT IN AERS
2.0000 | INHALATION_SPRAY | RESPIRATORY_TRACT | Status: DC | PRN
  Filled 2021-02-03: qty 6.7

## 2021-02-03 MED ORDER — PRAVASTATIN SODIUM 40 MG PO TABS: 40.0000 mg | ORAL_TABLET | Freq: Every day | ORAL | Status: DC

## 2021-02-03 MED ORDER — LORATADINE 10 MG PO TABS: 10.0000 mg | ORAL_TABLET | Freq: Every day | ORAL | Status: DC

## 2021-02-03 MED ORDER — ATENOLOL 25 MG PO TABS
50.0000 mg | ORAL_TABLET | Freq: Two times a day (BID) | ORAL | Status: DC
  Administered 2021-02-03 – 2021-02-08 (×9): 50 mg via ORAL
  Filled 2021-02-03: qty 2
  Filled 2021-02-03 (×2): qty 1
  Filled 2021-02-03: qty 2
  Filled 2021-02-03: qty 1
  Filled 2021-02-03: qty 2
  Filled 2021-02-03: qty 1
  Filled 2021-02-03 (×3): qty 2
  Filled 2021-02-03 (×2): qty 1
  Filled 2021-02-03: qty 2
  Filled 2021-02-03: qty 1

## 2021-02-03 MED ORDER — ALBUTEROL SULFATE (2.5 MG/3ML) 0.083% IN NEBU
5.0000 mg | INHALATION_SOLUTION | Freq: Once | RESPIRATORY_TRACT | Status: AC
  Administered 2021-02-03: 5 mg via RESPIRATORY_TRACT
  Filled 2021-02-03: qty 6

## 2021-02-03 MED ORDER — COLCHICINE 0.6 MG PO TABS
0.6000 mg | ORAL_TABLET | Freq: Two times a day (BID) | ORAL | Status: DC
Start: 2021-02-04 — End: 2021-02-08
  Administered 2021-02-04 – 2021-02-08 (×8): 0.6 mg via ORAL
  Filled 2021-02-03 (×13): qty 1

## 2021-02-03 MED ORDER — HEPARIN BOLUS VIA INFUSION
4500.0000 [IU] | Freq: Once | INTRAVENOUS | Status: AC
  Administered 2021-02-03: 4500 [IU] via INTRAVENOUS
  Filled 2021-02-03: qty 4500

## 2021-02-03 MED ORDER — HYDROCHLOROTHIAZIDE 12.5 MG PO TABS
12.5000 mg | ORAL_TABLET | Freq: Every day | ORAL | Status: DC
  Administered 2021-02-04: 12.5 mg via ORAL
  Filled 2021-02-03: qty 1

## 2021-02-03 MED ORDER — PRAVASTATIN SODIUM 40 MG PO TABS
40.0000 mg | ORAL_TABLET | Freq: Every day | ORAL | Status: DC
  Administered 2021-02-03 – 2021-02-06 (×4): 40 mg via ORAL
  Filled 2021-02-03 (×4): qty 1

## 2021-02-03 MED ORDER — LORATADINE 10 MG PO TABS: 10.0000 mg | ORAL_TABLET | Freq: Every day | ORAL | Status: DC | PRN

## 2021-02-03 MED ORDER — FUROSEMIDE 10 MG/ML IJ SOLN
40.0000 mg | Freq: Once | INTRAMUSCULAR | Status: AC
  Administered 2021-02-03: 40 mg via INTRAVENOUS
  Filled 2021-02-03: qty 4

## 2021-02-03 MED ORDER — AMLODIPINE BESYLATE 5 MG PO TABS
5.0000 mg | ORAL_TABLET | Freq: Every day | ORAL | Status: DC
  Administered 2021-02-03 – 2021-02-04 (×2): 5 mg via ORAL
  Filled 2021-02-03 (×2): qty 1

## 2021-02-03 MED ORDER — LOSARTAN POTASSIUM 50 MG PO TABS
100.0000 mg | ORAL_TABLET | Freq: Every day | ORAL | Status: DC
  Administered 2021-02-04: 100 mg via ORAL
  Filled 2021-02-03: qty 2

## 2021-02-03 NOTE — H&P (Signed)
[facility] Admission History and Physical Service Pager: [phone]  Patient name: Arthur [name] Medical record number: [id number] Date of birth: [birth year] Age: 81 y.o. Gender: male  Primary Care Provider: Lind Covert, MD Consultants: None Code Status: Full Preferred Emergency Contact:  Velora Mediate (daughter) [phone] Vipul (decision maker per daughter if unable to speak for himself) [phone]  Chief Complaint: Shortness of breath  Assessment and Plan: Arthur [name] is a 81 y.o. male presenting with shortness of breath x2 days . PMH is significant for HTN, T2DM, HLD, gout, GERD, Hx of pulmonary embolus and DVT  Multiple PE Patient presented with 2 days of increased exertional SOB. Per EMS, patient was hypoxic with ambulation. In the ER, CXR showed some increased interstitial markings. CTA PE showed acute emboli within the distal right pulmonary artery and segmental right upper and middle lobe pulmonary vessels without right heart strain.  CT chest also noted multiple pulmonary nodules in the RLL suspicious for metastatic disease with mediastinal and hilar adenopathy.  Patient has not had any signs or symptoms of peripheral DVT and physical exam is largely unremarkable.  Patient is able to maintain oxygen saturation without supplemental O2. EKG is normal and vitals are stable.  Patient has a history of provoked DVT and PE in the setting of prior surgery in 2017.  Patient has not had any recent risk factors for DVT including immobilization, air travel, surgery, and is not a smoker. At this time, no obvious source for clotting.  In the setting of pulmonary nodules noted on CT scan, consideration for hypercoagulable state with a possible cancer.   - Admit to FPTS, attending Dr. Andria Frames - Continuous cardiac monitoring - Heparin GTT per pharmacy - Vitals per routine - Continuous O2 monitoring - Supplemental O2 to maintain O2 saturations > 92% -  PT/INR, APTT - Echocardiogram - Ambulation with pulse ox prior to discharge - Consider lower extremity Dopplers - Obtain troponin  Pulmonary nodules   Unintentional weight loss CTA skin noted emphysema in the lungs as well as interim development of multiple pulmonary nodules present in the RUL RLL and LLL.  There is also noted mediastinal and hilar adenopathy with suspicion for metastatic disease.  Patient does not have any known history of cancer, but does have iron deficiency anemia and unintentional weight loss of about 15 pounds in the last year.  Colonoscopy 2017 showed tubular adenoma without high-grade dysplasia or invasive malignancy, patient is due for follow-up.  Patient does not have any urinary symptoms, but we will collect a UA to evaluate for hematuria.  Consideration to collect tumor markers such as CEA or abdominal imaging to further evaluate, we will not order at this time we will continue discussion. - Urinalysis - Patient will need colonoscopy outpatient - Consider further evaluation and inpatient versus outpatient - CBC with differential in the a.m.  G1DD BNP today elevated to 530.8.  Echocardiogram in 2017 (during evaluation of prior pulmonary embolus) showed G1DD, LVEF 55-60%, RV mildly dilated with severely reduced systolic function, pulmonary artery pressures increased.  Patient does not appear to be fluid overloaded at this time but will continue to monitor.  We will repeat echocardiogram, if concerning findings and we will consider adding on strict I's and O's and daily weights. - Repeat echocardiogram - Continue to monitor volume status  Leukocytosis WBC mildly elevated to 12.5. Patient does not have any infectious symptoms and his respiratory testing was negative. Patient does have pulmonary emboli that may be  related, will obtain a CBC with differential in the morning to further evaluate - CBC with differential in the AM  Anemia, iron deficiency Per chart  documentation, anemia was noted last year.  Hgb today is 12.6, which appears to be around his baseline in the last 6 months. - Continue to monitor with CBC  HTN Chronic, stable.  Home medications include amlodipine 5 mg nightly, atenolol 50 mg twice daily, HCTZ 12.5 mg daily, losartan 100 mg daily.  Patient notes that he did take his morning doses of his medications. - Continue home medications - Continue to monitor blood pressure  Gout Chronic, stable.  Home medications include colchicine 0.6 mg twice daily and allopurinol 100 mg daily.  - Continue home medication  T2DM Chronic, stable.  A1c collected on 1/4 was 6.7. Home medications include metformin social history 1000 mg twice daily. - Hold metformin - Monitor via glucose on BMP  Hyperlipidemia Last lipid panel 09/08/2019 with LDL 82, total cholesterol 157.  Home medications include pravastatin 40 mg nightly. - Continue home pravastatin 40 mg nightly  GERD Chronic, stable.  Home medications include: Omeprazole 20 mg daily as needed - Holding omeprazole  Seasonal allergies Chronic, stable.  Home medications include Claritin 10 mg as needed - Continue Claritin as needed    FEN/GI: Regular diet Prophylaxis: Heparin drip per pharmacy  Disposition: Med telemetry  History of Present Illness:  Arthur [name] is a 81 y.o. male presenting with acute shortness of breath.  History is provided by patient and daughter.  In the last several days, patient has noticed a progressive onset of shortness of breath with activity.  This morning he noticed a more acute shortness of breath with even turning over in bed.  EMS was called and patient's blood pressure was significantly elevated and his oxygen dropped whenever he was ambulated and he was brought to the hospital.  Patient notes he has not had any swelling of his legs, chest pain, palpitations, pain anywhere in his body.  He does notes that he has had increasing weakness that is  generalized along with his shortness of breath.  Patient family do note a weight loss of 10-15lbsin the last year that has been unintentional.  They report that it is previously been mentioned but was thought likely secondary to decreasing muscle mass.  He does also note that he has dark stools that have been present since starting his iron supplementation that has not changed recently he does not have any history of rectal bleeding that he is aware of.   Review Of Systems: Per HPI with the following additions:   Review of Systems  Constitutional:  Positive for activity change and unexpected weight change. Negative for chills, diaphoresis, fatigue and fever.  HENT:  Negative for congestion, rhinorrhea, sore throat and trouble swallowing.   Eyes:  Negative for visual disturbance.  Respiratory:  Positive for shortness of breath. Negative for choking and wheezing.   Cardiovascular:  Negative for chest pain, palpitations and leg swelling.  Gastrointestinal:  Negative for abdominal pain, blood in stool, constipation, diarrhea, nausea and vomiting.  Genitourinary:  Negative for decreased urine volume, difficulty urinating, frequency and hematuria.  Musculoskeletal:  Negative for arthralgias and myalgias.  Skin:  Negative for rash.  Neurological:  Positive for weakness (generalized). Negative for dizziness, light-headedness and headaches.  Psychiatric/Behavioral:  Negative for confusion.     Patient Active Problem List   Diagnosis Date Noted   Pulmonary embolus ([place]) 02/03/2021   Cerumen impaction  01/16/2021   BPPV (benign paroxysmal positional vertigo) 01/16/2021   Localized swelling of left foot 06/20/2020   Allergic rhinitis 06/06/2020   Right calf pain 02/13/2020   Trigger thumb 04/08/2018   Anemia 09/06/2015   History of pulmonary embolism    Recurrent cough 03/15/2014   GERD (gastroesophageal reflux disease) 07/05/2013   Arthritis 02/26/2010   Gout 11/06/2009   Diabetes mellitus,  type II ([place]) 09/22/2009   Hyperlipidemia 09/22/2009   Essential hypertension, benign 09/22/2009    Past Medical History: Past Medical History:  Diagnosis Date   Diabetes mellitus without complication ([place])    High cholesterol    Hypertension    Normal nuclear stress test 2010   stress perfusion study apparently in 2010 in [facility] which he said was negative.    Past Surgical History: Past Surgical History:  Procedure Laterality Date   CATARACT EXTRACTION, BILATERAL  2006   KNEE SURGERY  2005    Social History: Social History   Tobacco Use   Smoking status: Never   Smokeless tobacco: Never  Substance Use Topics   Alcohol use: No   Drug use: No   Additional social history: None Please also refer to relevant sections of EMR.  Family History: Family History  Problem Relation Age of Onset   Coronary artery disease Father    Coronary artery disease Mother     Allergies and Medications: Allergies  Allergen Reactions   Ace Inhibitors     REACTION: Cough   No current facility-administered medications on file prior to encounter.   Current Outpatient Medications on File Prior to Encounter  Medication Sig Dispense Refill   acetaminophen (TYLENOL) 650 MG CR tablet Take 650 mg by mouth every 8 (eight) hours as needed for pain.     allopurinol (ZYLOPRIM) 100 MG tablet Take 1 tablet (100 mg total) by mouth daily. 90 tablet 0   amLODipine (NORVASC) 5 MG tablet Take 1 tablet (5 mg total) by mouth at bedtime. 90 tablet 3   atenolol (TENORMIN) 50 MG tablet Take 1 tablet (50 mg total) by mouth 2 (two) times daily. 180 tablet 3   benzonatate (TESSALON) 100 MG capsule TAKE 1 CAPSULE BY MOUTH TWICE DAILY AS NEEDED FOR COUGH (Patient taking differently: Take 100 mg by mouth 3 (three) times daily as needed for cough.) 15 capsule 0   colchicine 0.6 MG tablet Take 1 tablet (0.6 mg total) by mouth 2 (two) times daily. 180 tablet 1   diclofenac Sodium (VOLTAREN) 1 % GEL Apply 2  g topically 4 (four) times daily. (Patient taking differently: Apply 2-4 g topically 4 (four) times daily as needed (knees/legs).) 50 g 1   ferrous sulfate 324 (65 Fe) MG TBEC Take 324 mg by mouth daily. 30 tablet    hydrochlorothiazide (HYDRODIURIL) 12.5 MG tablet TAKE 1 TABLET BY MOUTH ONCE DAILY **NEW  LOWER  STRENGTH** (Patient taking differently: Take 12.5 mg by mouth daily.) 90 tablet 3   loratadine (CLARITIN) 10 MG tablet Take 1 tablet (10 mg total) by mouth daily. (Patient taking differently: Take 10 mg by mouth daily as needed for allergies.) 30 tablet 1   losartan (COZAAR) 100 MG tablet Take 1 tablet (100 mg total) by mouth daily. 90 tablet 3   metFORMIN (GLUCOPHAGE) 1000 MG tablet Take 1 tablet (1,000 mg total) by mouth 2 (two) times daily with a meal. 180 tablet 3   Multiple Vitamins-Minerals (CENTRUM SILVER 50+MEN) TABS Take 1 tablet by mouth daily.  omeprazole (PRILOSEC) 20 MG capsule TAKE 1 CAPSULE BY MOUTH ONCE DAILY AS NEEDED (Patient taking differently: Take 20 mg by mouth daily as needed (heartburn).) 90 capsule 0   pravastatin (PRAVACHOL) 40 MG tablet Take 1 tablet (40 mg total) by mouth daily. (Patient taking differently: Take 40 mg by mouth at bedtime.) 90 tablet 3   celecoxib (CELEBREX) 200 MG capsule Take 200 mg by mouth daily as needed. (Patient not taking: Reported on 12/20/2020)     trolamine salicylate (ASPER-FLEX) 10 % cream Apply 1 application topically as needed for muscle pain. (Patient not taking: Reported on 03/08/2020) 85 g 0    Objective: BP 140/71    Pulse 78    Temp 98.4 F (36.9 C)    Resp 17    SpO2 93%  Exam: General -- oriented x3, pleasant and cooperative. Family at bedside HEENT -- Head is normocephalic. PERRLA. EOMI. Ears, nose and throat were benign. Neck -- supple; no bruits. Integument -- intact. No rash, erythema, or ecchymoses.  Chest -- good expansion. Lungs clear to auscultation. Cardiac -- RRR. No murmurs noted.  Abdomen -- soft, nontender.  No masses palpable. Bowel sounds present. Extremities - no tenderness or effusions noted. Full ROM. Dorsalis pedis pulses present and symmetrical.    Labs and Imaging: CBC BMET  Recent Labs  Lab 02/03/21 1412  WBC 12.5*  HGB 12.6*  HCT 40.6  PLT 258   Recent Labs  Lab 02/03/21 1412  NA 137  K 3.9  CL 99  CO2 27  BUN 13  CREATININE 1.17  GLUCOSE 129*  CALCIUM 9.1     EKG: Sinus rhythm, no ST elevations or T wave inversions at this time.   DG Chest 2 View  Result Date: 02/03/2021 CLINICAL DATA:  Shortness of breath EXAM: CHEST - 2 VIEW COMPARISON:  Chest radiograph Jun 21, 2020 FINDINGS: The heart size and mediastinal contours are within normal limits. Diffuse bilateral prominent interstitial markings concerning for chronic interstitial lung disease or acute bronchitis/bronchiolitis. No definite focal consolidation. Multiple chronic healed left-sided rib fractures. IMPRESSION: Prominent bilateral interstitial markings which may represent chronic interstitial lung disease or acute viral bronchiolitis. No focal consolidation. Clinical correlation is suggested. Electronically Signed   By: Keane Police D.O.   On: 02/03/2021 15:04   CT Angio Chest PE W and/or Wo Contrast  Result Date: 02/03/2021 CLINICAL DATA:  Shortness of breath EXAM: CT ANGIOGRAPHY CHEST WITH CONTRAST TECHNIQUE: Multidetector CT imaging of the chest was performed using the standard protocol during bolus administration of intravenous contrast. Multiplanar CT image reconstructions and MIPs were obtained to evaluate the vascular anatomy. CONTRAST:  10mL OMNIPAQUE IOHEXOL 350 MG/ML SOLN COMPARISON:  Chest x-ray 02/03/2021, CT chest 05/18/2015 FINDINGS: Cardiovascular: Satisfactory opacification of the pulmonary arteries to the segmental level. Acute thrombus within right middle lobe subsegmental vessels. Thrombus also present within right upper lobe segmental and subsegmental vessels. Small amount of peripheral thrombus  within the distal right pulmonary artery. No evidence for right heart strain. Mild aortic atherosclerosis. No aneurysm. Coronary vascular calcification. Borderline to mild cardiomegaly. No pericardial effusion. Mediastinum/Nodes: Midline trachea. No thyroid mass. Enlarged right paratracheal lymph node measuring 13 mm. AP window lymph node measuring 18 mm. Right hilar node measuring 13 mm. Subcarinal lymph node measuring 14 mm. Esophagus within normal limits. Lungs/Pleura: Emphysema. Interim development of multiple pulmonary nodules. 8 mm right upper lobe pulmonary nodule, series 6 image 49. 18 mm right lower lobe lung nodule, series 6, image 75. 8 mm left  lower lobe pulmonary nodule, series 6, image 116. Upper Abdomen: No acute abnormality. 15 mm right adrenal nodule, unchanged and felt consistent with adenoma. Small lymph nodes adjacent to the aorta, at the porta hepatis and in the gastrohepatic region. Cyst upper pole right kidney. Small right kidney stone Musculoskeletal: No acute or suspicious osseous abnormality.Multiple remote left rib fractures. Review of the MIP images confirms the above findings. IMPRESSION: 1. Positive for acute pulmonary emboli within the distal right pulmonary artery and segmental and subsegmental right upper and middle lobe pulmonary vessels. Negative for right heart strain. 2. Interim development of multiple pulmonary nodules measuring up to 18 mm in the right lower lobe suspicious for metastatic disease. Mediastinal and hilar adenopathy, also suspect for metastatic disease. Critical Value/emergent results were called by telephone at the time of interpretation on 02/03/2021 at 7:25 pm to provider [place] RAY , who verbally acknowledged these results. Aortic Atherosclerosis (ICD10-I70.0) and Emphysema (ICD10-J43.9). Electronically Signed   By: Donavan Foil M.D.   On: 02/03/2021 19:26      Rise Patience, DO 02/03/2021, 9:38 PM PGY-2, [place] Intern pager:  [phone], text pages welcome

## 2021-02-03 NOTE — ED Provider Notes (Signed)
[facility] EMERGENCY DEPARTMENT Provider Note   CSN: [id number] Arrival date & time: 02/03/21  1406     History  No chief complaint on file.   Arthur [name] is a 81 y.o. male.  HPI  81 year old male history of type 2 diabetes, CHF, VTE and PE, distant past, presents today of increasing dyspnea over the past 2 days.  Dyspnea increased with exertion and lying flat.  Had some cough productive of clear sputum.  He denies fever or chills.  He denies any chest pain, abdominal pain, nausea, vomiting, diarrhea, peripheral swelling, recent immobilization.  First DVT was provoked with immobilization after surgery.  Home Medications Prior to Admission medications   Medication Sig Start Date End Date Taking? Authorizing Provider  acetaminophen (TYLENOL) 650 MG CR tablet Take 650 mg by mouth every 8 (eight) hours as needed for pain.    [provider]  allopurinol (ZYLOPRIM) 100 MG tablet Take 1 tablet (100 mg total) by mouth daily. 01/31/21   Carney Living, MD  amLODipine (NORVASC) 5 MG tablet Take 1 tablet (5 mg total) by mouth at bedtime. 01/31/21   Carney Living, MD  atenolol (TENORMIN) 50 MG tablet Take 1 tablet (50 mg total) by mouth 2 (two) times daily. 01/31/21   Chambliss, Estill Batten, MD  benzonatate (TESSALON) 100 MG capsule TAKE 1 CAPSULE BY MOUTH TWICE DAILY AS NEEDED FOR COUGH 02/01/21   Carney Living, MD  celecoxib (CELEBREX) 200 MG capsule Take 200 mg by mouth daily as needed. Patient not taking: Reported on 12/20/2020    [provider]  colchicine 0.6 MG tablet Take 1 tablet (0.6 mg total) by mouth 2 (two) times daily. 01/31/21   Carney Living, MD  diclofenac Sodium (VOLTAREN) 1 % GEL Apply 2 g topically 4 (four) times daily. 01/31/21   Carney Living, MD  ferrous sulfate 324 (65 Fe) MG TBEC Take by mouth daily. 08/17/20   Carney Living, MD  hydrochlorothiazide (HYDRODIURIL) 12.5 MG tablet TAKE 1 TABLET BY  MOUTH ONCE DAILY **NEW  LOWER  STRENGTH** 01/31/21   Carney Living, MD  loratadine (CLARITIN) 10 MG tablet Take 1 tablet (10 mg total) by mouth daily. 02/12/20   Sandre Kitty, MD  losartan (COZAAR) 100 MG tablet Take 1 tablet (100 mg total) by mouth daily. 01/31/21   Carney Living, MD  metFORMIN (GLUCOPHAGE) 1000 MG tablet Take 1 tablet (1,000 mg total) by mouth 2 (two) times daily with a meal. 01/31/21   Chambliss, Estill Batten, MD  omeprazole (PRILOSEC) 20 MG capsule TAKE 1 CAPSULE BY MOUTH ONCE DAILY AS NEEDED 02/01/21   Carney Living, MD  pravastatin (PRAVACHOL) 40 MG tablet Take 1 tablet (40 mg total) by mouth daily. 01/31/21   Carney Living, MD  trolamine salicylate (ASPER-FLEX) 10 % cream Apply 1 application topically as needed for muscle pain. Patient not taking: Reported on 03/08/2020 04/08/18   Carney Living, MD      Allergies    Ace inhibitors    Review of Systems   Review of Systems  All other systems reviewed and are negative.  Physical Exam Updated Vital Signs BP (!) 161/80    Pulse 76    Temp 98.4 F (36.9 C)    Resp (!) 26    SpO2 96%  Physical Exam Vitals and nursing note reviewed.  Constitutional:      General: He is not in acute distress.  Appearance: Normal appearance. He is not ill-appearing.     Comments: Patient with some dyspnea moving around in bed oxygen saturations 90 to 93% while resting has dipped down to 89-90 with movement  HENT:     Head: Normocephalic.     Right Ear: External ear normal.     Left Ear: External ear normal.     Nose: Nose normal.     Mouth/Throat:     Pharynx: Oropharynx is clear.  Eyes:     Extraocular Movements: Extraocular movements intact.     Pupils: Pupils are equal, round, and reactive to light.  Neck:     Vascular: No carotid bruit.  Cardiovascular:     Rate and Rhythm: Normal rate and regular rhythm.     Pulses: Normal pulses.     Heart sounds: Normal heart sounds.  Pulmonary:     Effort:  Pulmonary effort is normal.     Breath sounds: Normal breath sounds.  Abdominal:     General: Bowel sounds are normal.     Palpations: Abdomen is soft.  Musculoskeletal:        General: No swelling or tenderness. Normal range of motion.     Cervical back: Normal range of motion and neck supple. No rigidity or tenderness.     Right lower leg: No edema.     Left lower leg: No edema.  Lymphadenopathy:     Cervical: No cervical adenopathy.  Skin:    General: Skin is warm and dry.     Capillary Refill: Capillary refill takes less than 2 seconds.  Neurological:     Mental Status: He is alert.  Psychiatric:        Mood and Affect: Mood normal.        Behavior: Behavior normal.    ED Results / Procedures / Treatments   Labs (all labs ordered are listed, but only abnormal results are displayed) Labs Reviewed  CBC - Abnormal; Notable for the following components:      Result Value   WBC 12.5 (*)    Hemoglobin 12.6 (*)    All other components within normal limits  BASIC METABOLIC PANEL - Abnormal; Notable for the following components:   Glucose, Bld 129 (*)    All other components within normal limits  D-DIMER, QUANTITATIVE - Abnormal; Notable for the following components:   D-Dimer, Quant >20.00 (*)    All other components within normal limits  BRAIN NATRIURETIC PEPTIDE - Abnormal; Notable for the following components:   B Natriuretic Peptide 530.8 (*)    All other components within normal limits  RESP PANEL BY RT-PCR (FLU A&B, COVID) [id number]  HEPARIN LEVEL (UNFRACTIONATED)  CBC    EKG EKG Interpretation  Date/Time:  Saturday February 03 2021 15:50:46 EST Ventricular Rate:  81 PR Interval:  244 QRS Duration: 75 QT Interval:  380 QTC Calculation: 442 R Axis:   13 Text Interpretation: Sinus rhythm Prolonged PR interval Confirmed by Pattricia Boss [phone]) on 02/03/2021 3:53:26 PM  Radiology DG Chest 2 View  Result Date: 02/03/2021 CLINICAL DATA:  Shortness of breath EXAM:  CHEST - 2 VIEW COMPARISON:  Chest radiograph Jun 21, 2020 FINDINGS: The heart size and mediastinal contours are within normal limits. Diffuse bilateral prominent interstitial markings concerning for chronic interstitial lung disease or acute bronchitis/bronchiolitis. No definite focal consolidation. Multiple chronic healed left-sided rib fractures. IMPRESSION: Prominent bilateral interstitial markings which may represent chronic interstitial lung disease or acute viral bronchiolitis. No focal consolidation. Clinical correlation is  suggested. Electronically Signed   By: Keane Police D.O.   On: 02/03/2021 15:04   CT Angio Chest PE W and/or Wo Contrast  Result Date: 02/03/2021 CLINICAL DATA:  Shortness of breath EXAM: CT ANGIOGRAPHY CHEST WITH CONTRAST TECHNIQUE: Multidetector CT imaging of the chest was performed using the standard protocol during bolus administration of intravenous contrast. Multiplanar CT image reconstructions and MIPs were obtained to evaluate the vascular anatomy. CONTRAST:  63mL OMNIPAQUE IOHEXOL 350 MG/ML SOLN COMPARISON:  Chest x-Wonda Goodgame 02/03/2021, CT chest 05/18/2015 FINDINGS: Cardiovascular: Satisfactory opacification of the pulmonary arteries to the segmental level. Acute thrombus within right middle lobe subsegmental vessels. Thrombus also present within right upper lobe segmental and subsegmental vessels. Small amount of peripheral thrombus within the distal right pulmonary artery. No evidence for right heart strain. Mild aortic atherosclerosis. No aneurysm. Coronary vascular calcification. Borderline to mild cardiomegaly. No pericardial effusion. Mediastinum/Nodes: Midline trachea. No thyroid mass. Enlarged right paratracheal lymph node measuring 13 mm. AP window lymph node measuring 18 mm. Right hilar node measuring 13 mm. Subcarinal lymph node measuring 14 mm. Esophagus within normal limits. Lungs/Pleura: Emphysema. Interim development of multiple pulmonary nodules. 8 mm right upper  lobe pulmonary nodule, series 6 image 49. 18 mm right lower lobe lung nodule, series 6, image 75. 8 mm left lower lobe pulmonary nodule, series 6, image 116. Upper Abdomen: No acute abnormality. 15 mm right adrenal nodule, unchanged and felt consistent with adenoma. Small lymph nodes adjacent to the aorta, at the porta hepatis and in the gastrohepatic region. Cyst upper pole right kidney. Small right kidney stone Musculoskeletal: No acute or suspicious osseous abnormality.Multiple remote left rib fractures. Review of the MIP images confirms the above findings. IMPRESSION: 1. Positive for acute pulmonary emboli within the distal right pulmonary artery and segmental and subsegmental right upper and middle lobe pulmonary vessels. Negative for right heart strain. 2. Interim development of multiple pulmonary nodules measuring up to 18 mm in the right lower lobe suspicious for metastatic disease. Mediastinal and hilar adenopathy, also suspect for metastatic disease. Critical Value/emergent results were called by telephone at the time of interpretation on 02/03/2021 at 7:25 pm to provider [facility] Katarzyna Wolven , who verbally acknowledged these results. Aortic Atherosclerosis (ICD10-I70.0) and Emphysema (ICD10-J43.9). Electronically Signed   By: Donavan Foil M.D.   On: 02/03/2021 19:26    Procedures .Critical Care Performed by: Pattricia Boss, MD Authorized by: Pattricia Boss, MD   Critical care provider statement:    Critical care time (minutes):  30   Critical care was necessary to treat or prevent imminent or life-threatening deterioration of the following conditions:  Respiratory failure   Critical care was time spent personally by me on the following activities:  Development of treatment plan with patient or surrogate, discussions with consultants, evaluation of patient's response to treatment, examination of patient, ordering and review of laboratory studies, ordering and review of radiographic studies, ordering and  performing treatments and interventions, pulse oximetry, re-evaluation of patient's condition and review of old charts    Medications Ordered in ED Medications  albuterol (VENTOLIN HFA) 108 (90 Base) MCG/ACT inhaler 2 puff (has no administration in time range)  heparin bolus via infusion 4,500 Units (has no administration in time range)  heparin ADULT infusion 100 units/mL (25000 units/259mL) (has no administration in time range)  albuterol (PROVENTIL) (2.5 MG/3ML) 0.083% nebulizer solution 5 mg (5 mg Nebulization Given 02/03/21 1748)  furosemide (LASIX) injection 40 mg (40 mg Intravenous Given 02/03/21 1749)  iohexol (OMNIPAQUE) 350 MG/ML  injection 75 mL (75 mLs Intravenous Contrast Given 02/03/21 1844)    ED Course/ Medical Decision Making/ A&P Clinical Course as of 02/03/21 1954  Sat Feb 03, 2021  1646 CBC reviewed and personally interpreted with mild leukocytosis of 12,500 Be met personally reviewed and interpreted with mild hyperglycemia 129 otherwise normal [DR]  1647 Chest x-Vila Dory personally reviewed and interpreted with increased bilateral interstitial markings which could represent infection, edema, bronchitic changes Radiologist interpretation reviewed [DR]  1924 Received call from radiologist that patient has positive PE on CTA and also multiple nodules consistent with metastatic disease. [DR]    Clinical Course User Index [DR] Pattricia Boss, MD                           Medical Decision Making Patient presents with increasing dyspnea over the past 2 days worse with exertion and with laying flat.  He does have some cough but it is Fudim is clear. Differential diagnosis includes inflammation infection of the lung such as pneumonia, bronchitis Patient with known history of DVT and PE increases likelihood of current thromboembolic events Patient with some history of CHF, some symptoms are worse with laying flat and he has some diffuse increased markings on his EKG. EKG is clear will  add BNP   Amount and/or Complexity of Data Reviewed Independent Historian:     Details: Daughter assists withHistory.  She is at bedside Labs: ordered. Decision-making details documented in ED Course. Radiology: ordered and independent interpretation performed. Decision-making details documented in ED Course. ECG/medicine tests: ordered and independent interpretation performed. Decision-making details documented in ED Course. Discussion of management or test interpretation with external provider(s): Presents today with new onset of dyspnea Differential diagnosis including PE, CHF, infection, CTA with multisegmental PE as well as nodules noted Heparin started in consult with pharmacy Discussed with family practice team where patient has primary care       Final Clinical Impression(s) / ED Diagnoses Final diagnoses:  Multiple subsegmental pulmonary emboli without acute cor pulmonale ([place])    Rx / DC Orders ED Discharge Orders     None         Pattricia Boss, MD 02/03/21 1954

## 2021-02-03 NOTE — ED Provider Triage Note (Signed)
Emergency Medicine Provider Triage Evaluation Note  PASQUAL FARIAS , a 81 y.o. male  was evaluated in triage.  Pt complains of cough and congestion with exertional SOB for 2 days. Arrives with EMS who states that patient desatted into the 80s after walking. Denies fevers or chills, chest pain, n/v/d  Review of Systems  Positive:  Negative: See above  Physical Exam  BP (!) 158/75    Pulse 76    Temp 99 F (37.2 C)    Resp 18    SpO2 99%  Gen:   Awake, no distress   Resp:  Normal effort  MSK:   Moves extremities without difficulty  Other:    Medical Decision Making  Medically screening exam initiated at 2:44 PM.  Appropriate orders placed.  Keedan R Mannor was informed that the remainder of the evaluation will be completed by another provider, this initial triage assessment does not replace that evaluation, and the importance of remaining in the ED until their evaluation is complete.     Bud Face, PA-C 02/03/21 1445

## 2021-02-03 NOTE — ED Notes (Addendum)
Pt and daughter updated on POC. Heparin gtt to be started.  Pt aware that urine is needed for sample. Urinal at [facility]

## 2021-02-03 NOTE — ED Notes (Signed)
Patient is resting comfortably. Daughter at [facility]

## 2021-02-03 NOTE — Hospital Course (Addendum)
Arthur [name] is a 81 y.o. male presenting for worsening shortness of breath, found to have multiple pulmonary emboli. PMH significant for HTN, T2DM, HLD, gout, GERD, Hx of pulmonary embolus and DVT.  Cardiac arrest   CAD On 1/8, while awaiting admission in the ED, patient got up to use the bathroom and then upon return, complained of SOB/chest tightness. He became unresponsive/pulseless, and received CPR, and epi. ROSC was obtained after 5 minutes. EKG done just prior to arrest showed WCT concerning for VT. Once regained pulse, he was awake and protecting his airway. Started on IV amiodarone with bolus. CCM and cardiology were consulted and patient went to ICU. Troponin's were elevated at 262>283,  but trended down by discharge. TTE showed LVEF 50-55%, RVSP 66 mmHg, severely elevated PA pressure, RV systolic function mildly reduced, RA mild dilated. Patient had LHC performed showing multi-vessel disease, with 80% stenosis of mid LAD lesion. PCI with DES to LAD; balloon angioplasty 1st diagonal Patient was later started on plavix, to continue for 3 months.    Multiple PE Patient presented with 2 days of increased exertional SOB. Per EMS, patient was hypoxic with ambulation. In the ER, CXR showed some increased interstitial markings. CTA PE showed acute emboli within the distal right pulmonary artery and segmental right upper and middle lobe pulmonary vessels without right heart strain. Patient was offered TPA, but family elected to use heparin. Patient was started on heparin GTT, in preparation for heart cath procedure, and was later switched to [place]. By discharge patient was stable sating well on RA.   Pulmonary HTN Patient had very elevated PA pressures on echo, with RVF mildly reduced  Pulmonary nodules   Unintentional weight loss CTA PE imaging was notable for multiple pulmonary nodules measuring up to 18 mm in the RUL, RLL, and LLL, with mediastinal and hilar adenopathy, concerning for  metastatic disease.  Notable history of unintentional weight loss and known iron deficiency anemia.   All other chronic conditions were stable and treated with home medications as appropriate.   Issues for follow-up: Recommend outpatient colonoscopy. Lifelong anticoagulation for PE hx Repeat Echo for PA pressures and RVF, s/p anticoagulation PET CT outpatient for pulm nodules Consider restarting HCTZ and Losartan

## 2021-02-03 NOTE — Progress Notes (Signed)
ANTICOAGULATION CONSULT NOTE - Initial Consult  Pharmacy Consult for heparin Indication: pulmonary embolus  Allergies  Allergen Reactions   Ace Inhibitors     REACTION: Cough    Patient Measurements:   Heparin Dosing Weight: TBW  Vital Signs: Temp: 98.4 F (36.9 C) (01/07 1541) BP: 161/80 (01/07 1745) Pulse Rate: 76 (01/07 1745)  Labs: Recent Labs    02/01/21 1100 02/03/21 1412  HGB  --  12.6*  HCT  --  40.6  PLT  --  258  CREATININE 1.20 1.17    Estimated Creatinine Clearance: 52.9 mL/min (by C-G formula based on SCr of 1.17 mg/dL).   Medical History: Past Medical History:  Diagnosis Date   Diabetes mellitus without complication ([place])    High cholesterol    Hypertension    Normal nuclear stress test 2010   stress perfusion study apparently in 2010 in [facility] which he said was negative.     Assessment: 32 YOM presenting with cough and SOB, CT angio chest with PE, no RHS.  He is not on anticoagulation PTA, chronic anemia stable, plts wnl  Goal of Therapy:  Heparin level 0.3-0.7 units/ml Monitor platelets by anticoagulation protocol: Yes   Plan:  Heparin 4500 units IV x 1, and gtt at 1200 units/hr F/u 8 hour heparin level  Bertis Ruddy, PharmD Clinical Pharmacist ED Pharmacist Phone # [phone] 02/03/2021 7:42 PM

## 2021-02-03 NOTE — ED Triage Notes (Signed)
Pt arrives via EMS from home with cough and exertional SOB for 2 days. Per EMS, pt O2 sats 93% RA and after walking 20-30 ft, O2 dropped to 80%.

## 2021-02-04 ENCOUNTER — Observation Stay (HOSPITAL_COMMUNITY): Payer: Medicare Other

## 2021-02-04 ENCOUNTER — Encounter: Payer: Self-pay | Admitting: Family Medicine

## 2021-02-04 ENCOUNTER — Encounter (HOSPITAL_COMMUNITY): Payer: Self-pay | Admitting: Pulmonary Disease

## 2021-02-04 ENCOUNTER — Other Ambulatory Visit: Payer: Self-pay

## 2021-02-04 DIAGNOSIS — M1A9XX0 Chronic gout, unspecified, without tophus (tophi): Secondary | ICD-10-CM | POA: Diagnosis present

## 2021-02-04 DIAGNOSIS — I472 Ventricular tachycardia, unspecified: Secondary | ICD-10-CM | POA: Diagnosis not present

## 2021-02-04 DIAGNOSIS — E1122 Type 2 diabetes mellitus with diabetic chronic kidney disease: Secondary | ICD-10-CM | POA: Diagnosis not present

## 2021-02-04 DIAGNOSIS — E876 Hypokalemia: Secondary | ICD-10-CM | POA: Diagnosis present

## 2021-02-04 DIAGNOSIS — R042 Hemoptysis: Secondary | ICD-10-CM | POA: Diagnosis not present

## 2021-02-04 DIAGNOSIS — N1831 Chronic kidney disease, stage 3a: Secondary | ICD-10-CM | POA: Diagnosis not present

## 2021-02-04 DIAGNOSIS — R634 Abnormal weight loss: Secondary | ICD-10-CM | POA: Diagnosis not present

## 2021-02-04 DIAGNOSIS — I4891 Unspecified atrial fibrillation: Secondary | ICD-10-CM | POA: Diagnosis present

## 2021-02-04 DIAGNOSIS — E7800 Pure hypercholesterolemia, unspecified: Secondary | ICD-10-CM | POA: Diagnosis present

## 2021-02-04 DIAGNOSIS — I2694 Multiple subsegmental pulmonary emboli without acute cor pulmonale: Secondary | ICD-10-CM | POA: Diagnosis not present

## 2021-02-04 DIAGNOSIS — I10 Essential (primary) hypertension: Secondary | ICD-10-CM

## 2021-02-04 DIAGNOSIS — E1165 Type 2 diabetes mellitus with hyperglycemia: Secondary | ICD-10-CM | POA: Diagnosis present

## 2021-02-04 DIAGNOSIS — R918 Other nonspecific abnormal finding of lung field: Secondary | ICD-10-CM | POA: Diagnosis not present

## 2021-02-04 DIAGNOSIS — R931 Abnormal findings on diagnostic imaging of heart and coronary circulation: Secondary | ICD-10-CM

## 2021-02-04 DIAGNOSIS — M1990 Unspecified osteoarthritis, unspecified site: Secondary | ICD-10-CM | POA: Diagnosis present

## 2021-02-04 DIAGNOSIS — I2609 Other pulmonary embolism with acute cor pulmonale: Secondary | ICD-10-CM | POA: Diagnosis not present

## 2021-02-04 DIAGNOSIS — I272 Pulmonary hypertension, unspecified: Secondary | ICD-10-CM | POA: Diagnosis not present

## 2021-02-04 DIAGNOSIS — I2699 Other pulmonary embolism without acute cor pulmonale: Secondary | ICD-10-CM | POA: Diagnosis not present

## 2021-02-04 DIAGNOSIS — E782 Mixed hyperlipidemia: Secondary | ICD-10-CM | POA: Diagnosis not present

## 2021-02-04 DIAGNOSIS — I97121 Postprocedural cardiac arrest following other surgery: Secondary | ICD-10-CM | POA: Diagnosis not present

## 2021-02-04 DIAGNOSIS — E1169 Type 2 diabetes mellitus with other specified complication: Secondary | ICD-10-CM

## 2021-02-04 DIAGNOSIS — D509 Iron deficiency anemia, unspecified: Secondary | ICD-10-CM | POA: Diagnosis present

## 2021-02-04 DIAGNOSIS — I462 Cardiac arrest due to underlying cardiac condition: Secondary | ICD-10-CM | POA: Diagnosis not present

## 2021-02-04 DIAGNOSIS — I469 Cardiac arrest, cause unspecified: Secondary | ICD-10-CM | POA: Diagnosis not present

## 2021-02-04 DIAGNOSIS — I129 Hypertensive chronic kidney disease with stage 1 through stage 4 chronic kidney disease, or unspecified chronic kidney disease: Secondary | ICD-10-CM | POA: Diagnosis not present

## 2021-02-04 DIAGNOSIS — I2693 Single subsegmental pulmonary embolism without acute cor pulmonale: Secondary | ICD-10-CM

## 2021-02-04 DIAGNOSIS — E871 Hypo-osmolality and hyponatremia: Secondary | ICD-10-CM | POA: Diagnosis not present

## 2021-02-04 DIAGNOSIS — D631 Anemia in chronic kidney disease: Secondary | ICD-10-CM | POA: Diagnosis not present

## 2021-02-04 DIAGNOSIS — Z20822 Contact with and (suspected) exposure to covid-19: Secondary | ICD-10-CM | POA: Diagnosis not present

## 2021-02-04 DIAGNOSIS — Z955 Presence of coronary angioplasty implant and graft: Secondary | ICD-10-CM | POA: Diagnosis not present

## 2021-02-04 DIAGNOSIS — R2242 Localized swelling, mass and lump, left lower limb: Secondary | ICD-10-CM | POA: Diagnosis not present

## 2021-02-04 DIAGNOSIS — I2510 Atherosclerotic heart disease of native coronary artery without angina pectoris: Secondary | ICD-10-CM | POA: Diagnosis not present

## 2021-02-04 DIAGNOSIS — K219 Gastro-esophageal reflux disease without esophagitis: Secondary | ICD-10-CM | POA: Diagnosis present

## 2021-02-04 DIAGNOSIS — R590 Localized enlarged lymph nodes: Secondary | ICD-10-CM | POA: Diagnosis present

## 2021-02-04 LAB — CBC WITH DIFFERENTIAL/PLATELET
Abs Immature Granulocytes: 0.06 10*3/uL (ref 0.00–0.07)
Basophils Absolute: 0.2 10*3/uL — ABNORMAL HIGH (ref 0.0–0.1)
Basophils Relative: 2 %
Eosinophils Absolute: 1.6 10*3/uL — ABNORMAL HIGH (ref 0.0–0.5)
Eosinophils Relative: 13 %
HCT: 38.6 % — ABNORMAL LOW (ref 39.0–52.0)
Hemoglobin: 12.3 g/dL — ABNORMAL LOW (ref 13.0–17.0)
Immature Granulocytes: 1 %
Lymphocytes Relative: 15 %
Lymphs Abs: 1.9 10*3/uL (ref 0.7–4.0)
MCH: 26.4 pg (ref 26.0–34.0)
MCHC: 31.9 g/dL (ref 30.0–36.0)
MCV: 82.8 fL (ref 80.0–100.0)
Monocytes Absolute: 1.4 10*3/uL — ABNORMAL HIGH (ref 0.1–1.0)
Monocytes Relative: 11 %
Neutro Abs: 7.6 10*3/uL (ref 1.7–7.7)
Neutrophils Relative %: 58 %
Platelets: 280 10*3/uL (ref 150–400)
RBC: 4.66 MIL/uL (ref 4.22–5.81)
RDW: 14 % (ref 11.5–15.5)
WBC: 12.7 10*3/uL — ABNORMAL HIGH (ref 4.0–10.5)
nRBC: 0 % (ref 0.0–0.2)

## 2021-02-04 LAB — URINALYSIS, COMPLETE (UACMP) WITH MICROSCOPIC
Bilirubin Urine: NEGATIVE
Glucose, UA: NEGATIVE mg/dL
Ketones, ur: NEGATIVE mg/dL
Leukocytes,Ua: NEGATIVE
Nitrite: NEGATIVE
Protein, ur: NEGATIVE mg/dL
Specific Gravity, Urine: <1.005 — ABNORMAL LOW (ref 1.005–1.030)
pH: 5.5 (ref 5.0–8.0)

## 2021-02-04 LAB — ECHOCARDIOGRAM COMPLETE
AR max vel: 2.07 cm2
AV Area VTI: 1.92 cm2
AV Area mean vel: 1.9 cm2
AV Mean grad: 6 mmHg
AV Peak grad: 11.6 mmHg
Ao pk vel: 1.7 m/s
Area-P 1/2: 4.21 cm2

## 2021-02-04 LAB — MAGNESIUM: Magnesium: 1.3 mg/dL — ABNORMAL LOW (ref 1.7–2.4)

## 2021-02-04 LAB — GLUCOSE, CAPILLARY
Glucose-Capillary: 156 mg/dL — ABNORMAL HIGH (ref 70–99)
Glucose-Capillary: 165 mg/dL — ABNORMAL HIGH (ref 70–99)
Glucose-Capillary: 183 mg/dL — ABNORMAL HIGH (ref 70–99)

## 2021-02-04 LAB — BASIC METABOLIC PANEL
Anion gap: 13 (ref 5–15)
BUN: 13 mg/dL (ref 8–23)
CO2: 27 mmol/L (ref 22–32)
Calcium: 8.9 mg/dL (ref 8.9–10.3)
Chloride: 95 mmol/L — ABNORMAL LOW (ref 98–111)
Creatinine, Ser: 1.33 mg/dL — ABNORMAL HIGH (ref 0.61–1.24)
GFR, Estimated: 54 mL/min — ABNORMAL LOW (ref >60)
Glucose, Bld: 124 mg/dL — ABNORMAL HIGH (ref 70–99)
Potassium: 3.4 mmol/L — ABNORMAL LOW (ref 3.5–5.1)
Sodium: 135 mmol/L (ref 135–145)

## 2021-02-04 LAB — TROPONIN I (HIGH SENSITIVITY)
Troponin I (High Sensitivity): 283 ng/L (ref <18)
Troponin I (High Sensitivity): 260 ng/L (ref <18)
Troponin I (High Sensitivity): 212 ng/L (ref <18)
Troponin I (High Sensitivity): 388 ng/L (ref <18)

## 2021-02-04 LAB — HEPARIN LEVEL (UNFRACTIONATED)
Heparin Unfractionated: 0.59 IU/mL (ref 0.30–0.70)
Heparin Unfractionated: 0.43 IU/mL (ref 0.30–0.70)

## 2021-02-04 IMAGING — CT CT HEAD W/O CM
4 series · 17 of 47 positions shown, 19 images · non-contrast
Comparison: None.

CLINICAL DATA: Altered mental status. Shortness of breath and chest
tightness. Status post fall.

EXAM:
CT HEAD WITHOUT CONTRAST
TECHNIQUE: Contiguous axial images were obtained from the base of the skull
through the vertex without intravenous contrast.

[Series 3: head wo · axial · 0.46mm/px · z∈[-148,-24]mm · 7 of 35 slices shown, 9 images]
[im 5/35  brain]
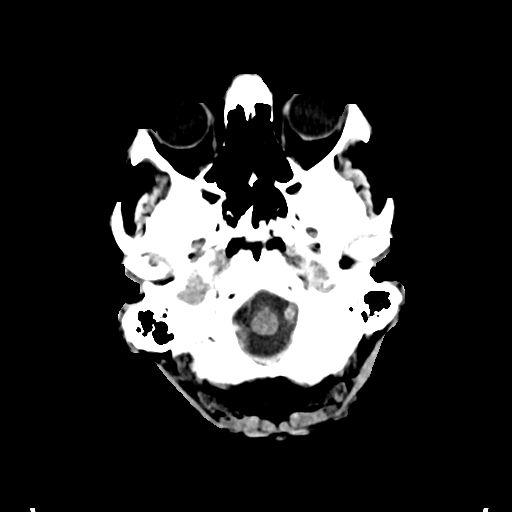
[im 5/35  bone]
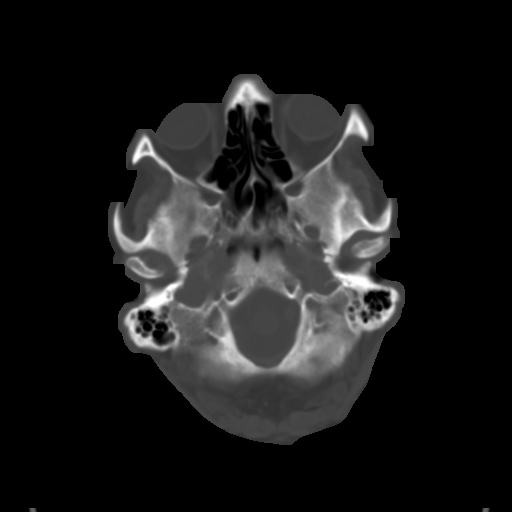
[im 9/35  brain]
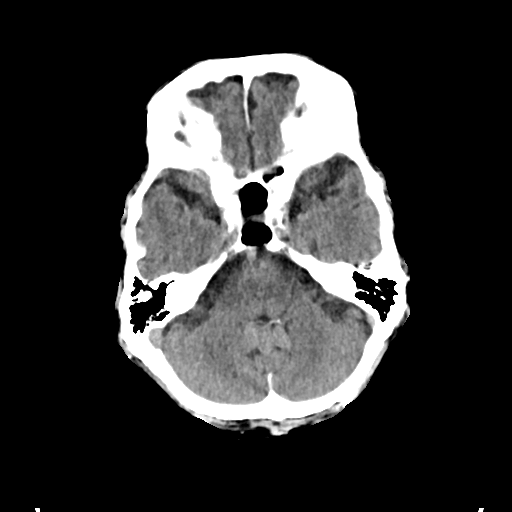
[im 13/35  brain]
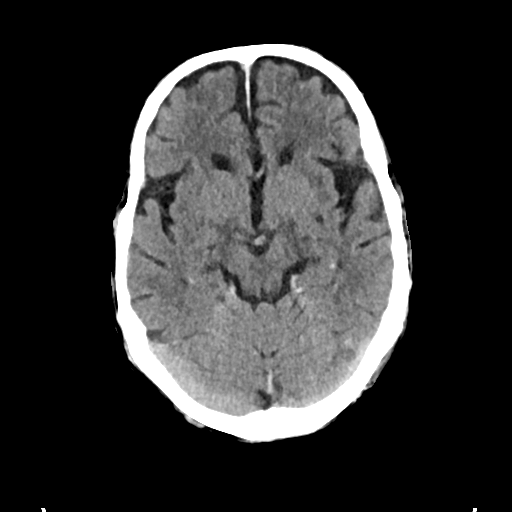
[im 18/35  brain]
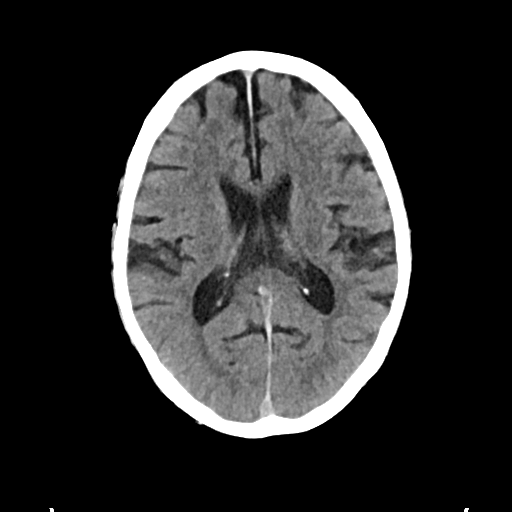
[im 22/35  brain]
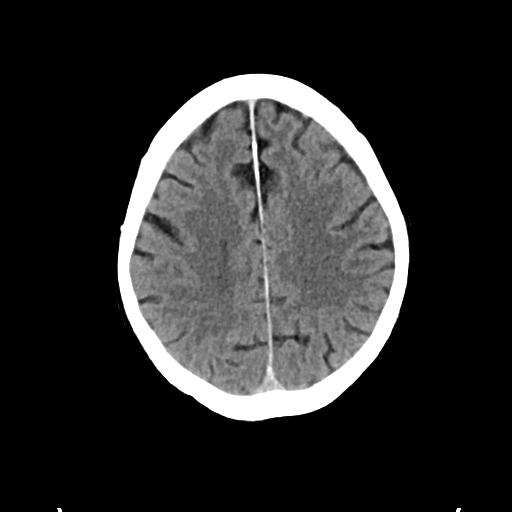
[im 22/35  bone]
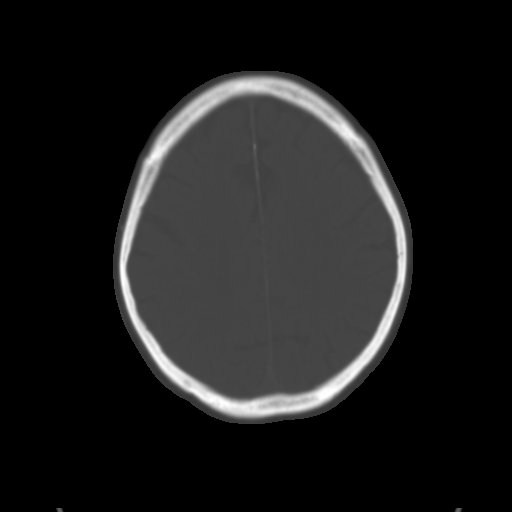
[im 26/35  brain]
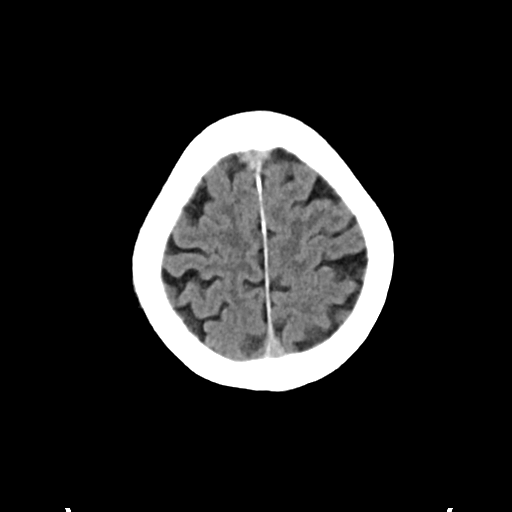
[im 30/35  brain]
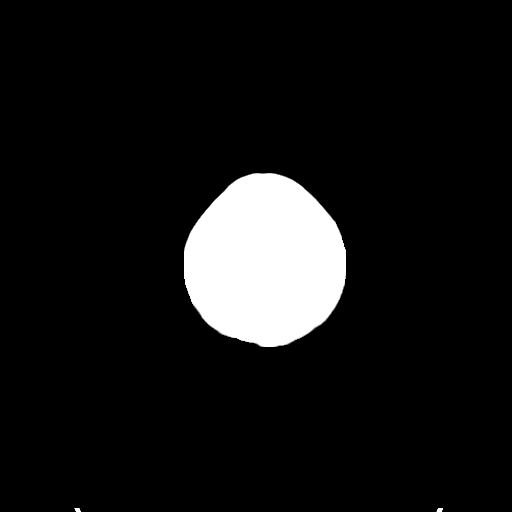

[Series 4: head bone · axial · 0.46mm/px · z∈[-152,-92]mm · 4 of 86 slices shown]
[im 9/86  bone]
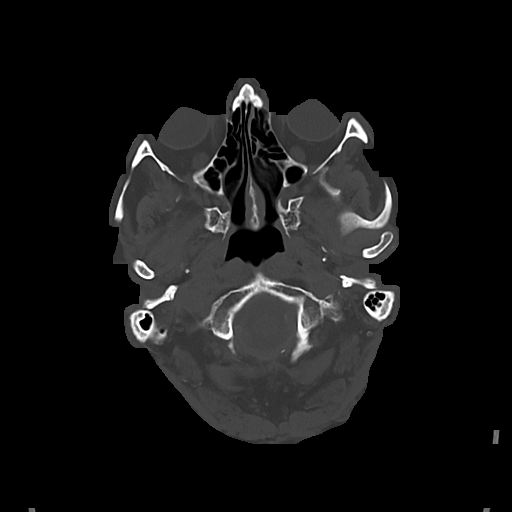
[im 18/86  bone]
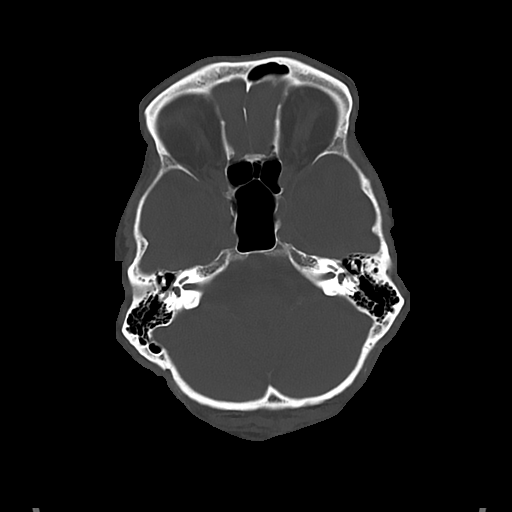
[im 26/86  bone]
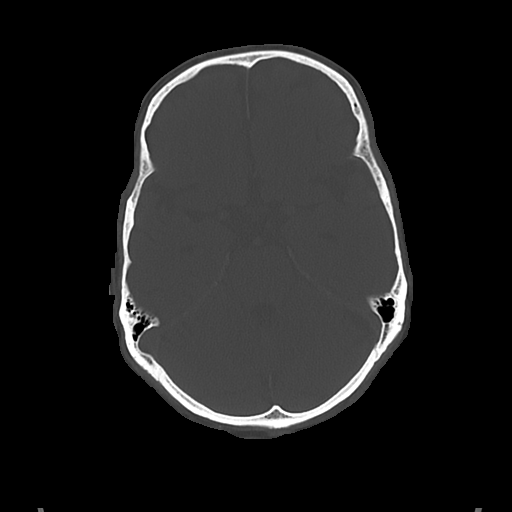
[im 39/86  bone]
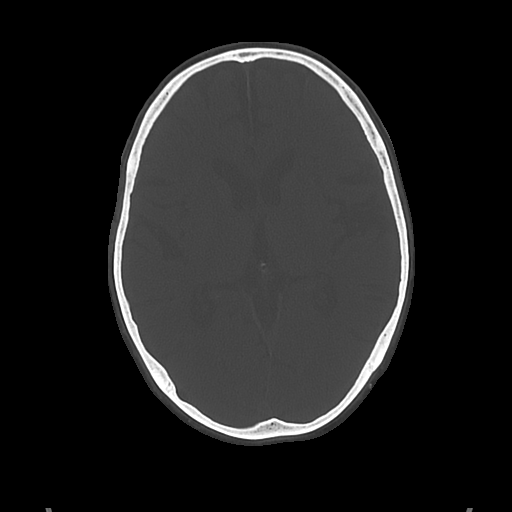

[Series 5: cor soft · coronal · 0.33mm/px · 3 of 73 slices shown]
[im 25/73  brain]
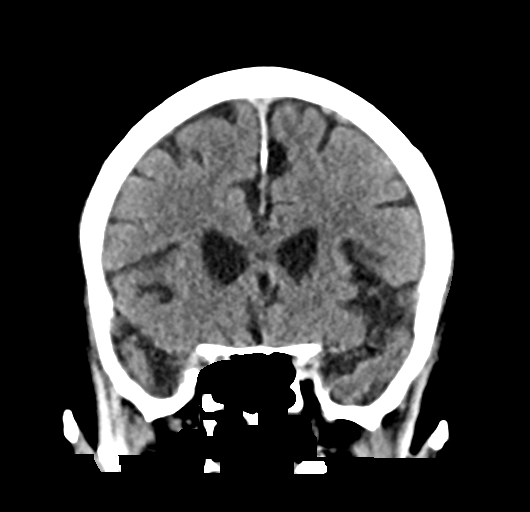
[im 33/73  brain]
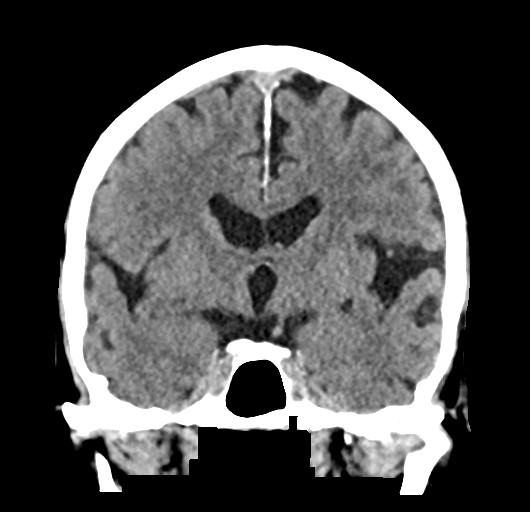
[im 41/73  brain]
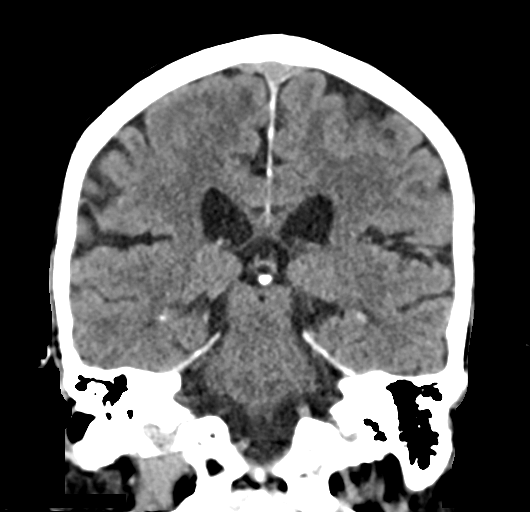

[Series 6: sag soft · sagittal · 0.33mm/px · 3 of 59 slices shown]
[im 20/59  brain]
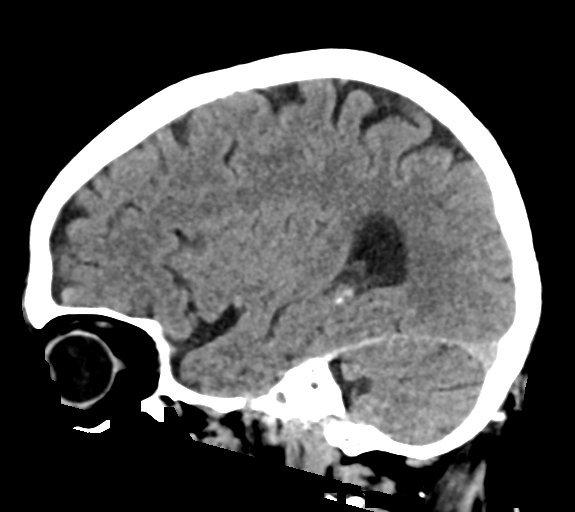
[im 30/59  brain]
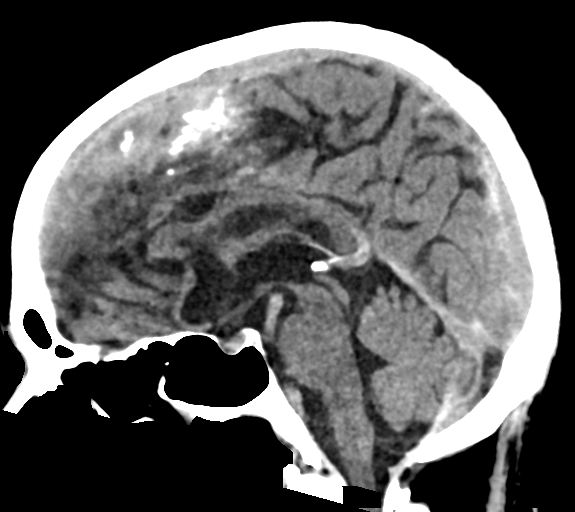
[im 39/59  brain]
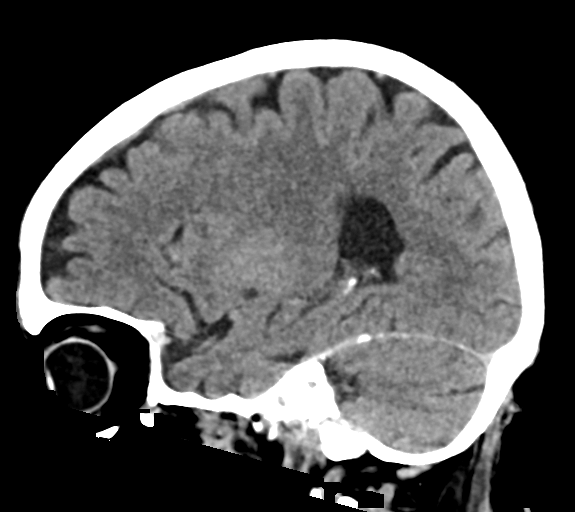

[17 of 47 positions shown; findings below may reference images not displayed]

FINDINGS: Brain: No evidence of acute infarction, hemorrhage, hydrocephalus,
extra-axial collection or mass lesion/mass effect. Thickening with
scattered calcifications identified along the falx and tentorium.
Prominence of sulci and ventricles compatible with brain atrophy.
There is mild diffuse low-attenuation within the subcortical and
periventricular white matter compatible with chronic microvascular
disease.

Vascular: No hyperdense vessel or unexpected calcification.

Skull: Normal. Negative for fracture or focal lesion.

Sinuses/Orbits: No acute finding.

Other: None
IMPRESSION: 1. No acute intracranial abnormalities.
2. Chronic small vessel ischemic change and brain atrophy.

## 2021-02-04 IMAGING — DX DG CHEST 1V PORT
1 series · 1 of 1 positions shown · non-contrast
Comparison: [DATE] and older studies.
COMPARISON: [DATE] and older studies.

Addendum:
CLINICAL DATA: Post CPR.

EXAM:
PORTABLE CHEST 1 VIEW

[chest ap]
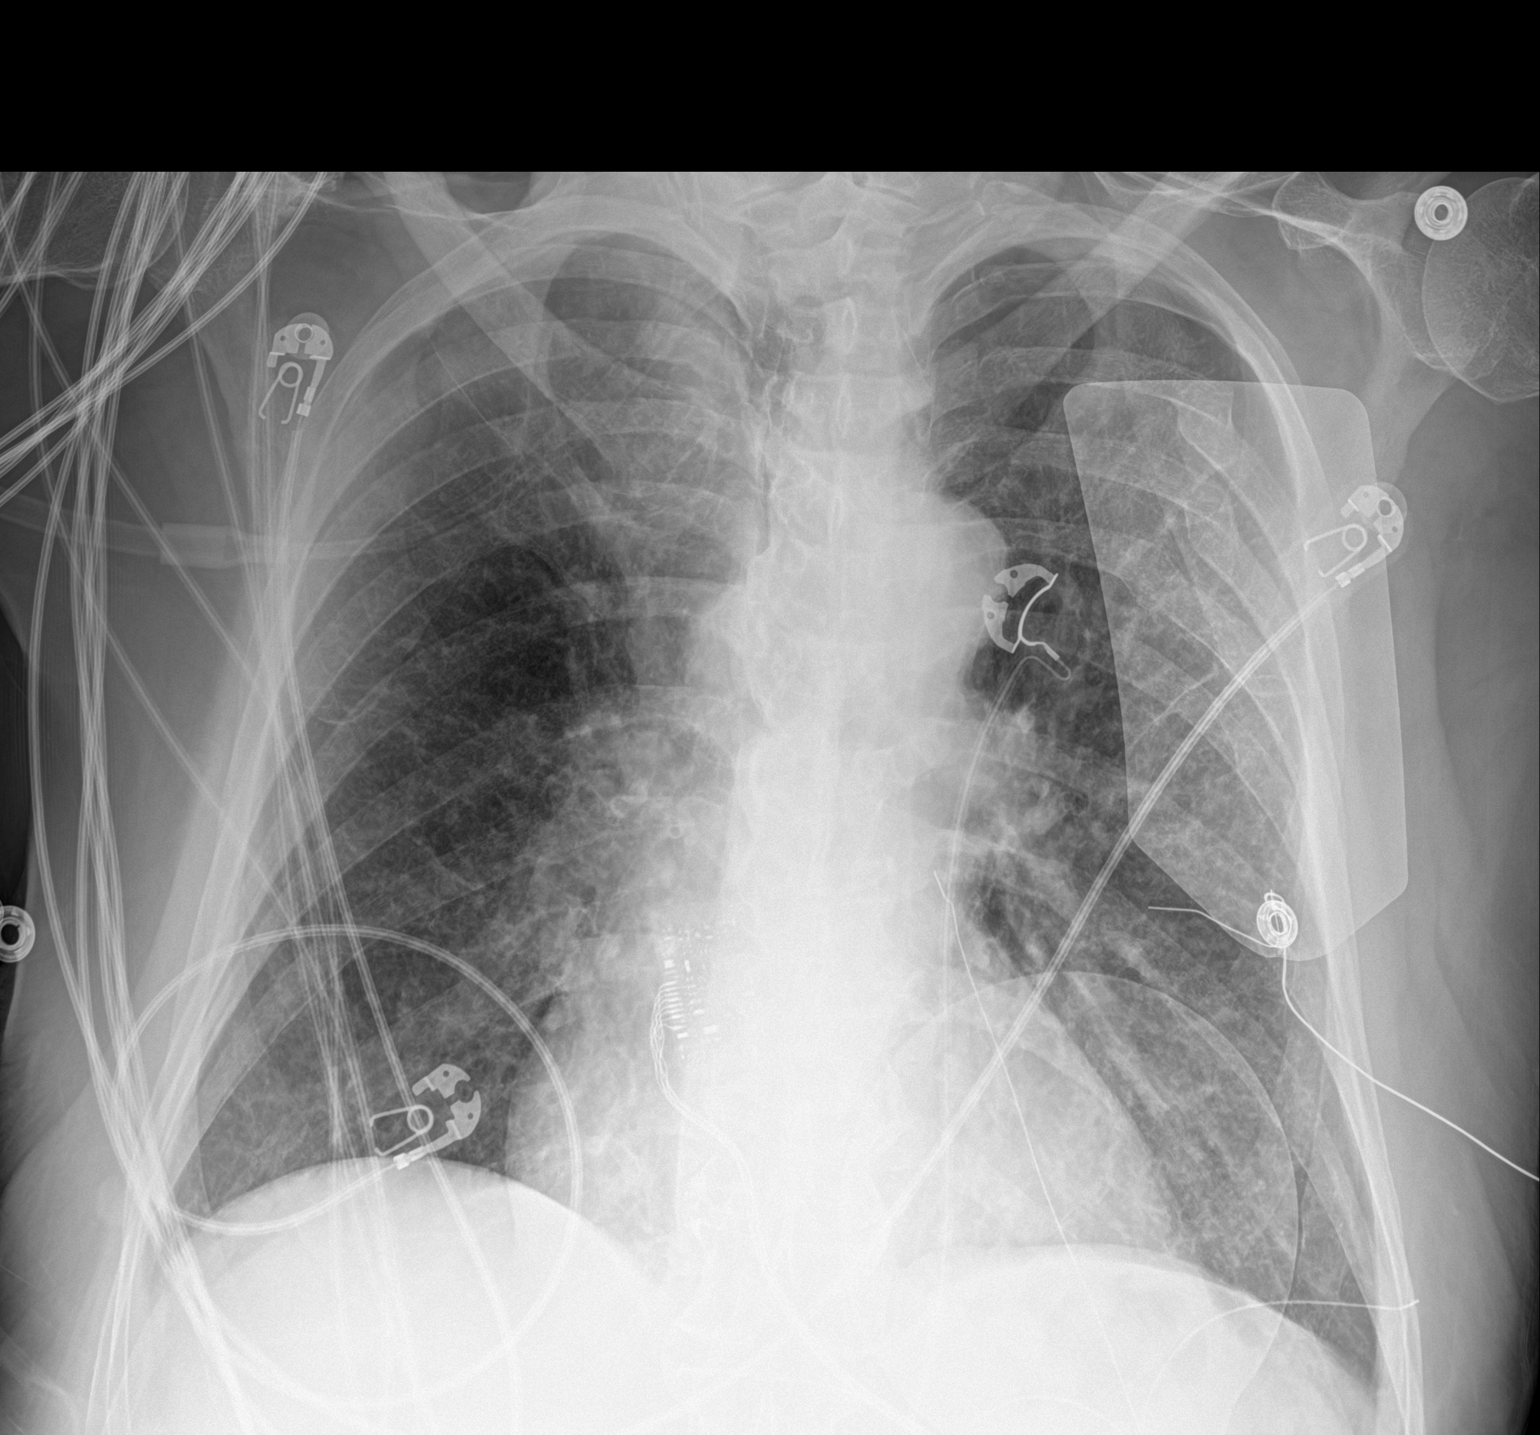

[1 of 1 positions shown; findings below may reference images not displayed]

FINDINGS: Cardiac silhouette is normal in size. No mediastinal or hilar
masses.

Prominent bronchovascular/interstitial markings, stable. No lung
consolidation or edema.

No convincing pleural effusion and no pneumothorax. Right lower lobe
nodule noted on the previous day's CT is not defined.

Skeletal structures are grossly intact.
IMPRESSION: No acute cardiopulmonary disease.

ADDENDUM:
There are old, healed posterior left fourth, fifth and sixth rib
fractures. No convincing acute rib fractures.

*** End of Addendum ***
FINDINGS: Cardiac silhouette is normal in size. No mediastinal or hilar
masses.

Prominent bronchovascular/interstitial markings, stable. No lung
consolidation or edema.

No convincing pleural effusion and no pneumothorax. Right lower lobe
nodule noted on the previous day's CT is not defined.

Skeletal structures are grossly intact.
IMPRESSION: No acute cardiopulmonary disease.

## 2021-02-04 MED ORDER — AMIODARONE HCL IN DEXTROSE 360-4.14 MG/200ML-% IV SOLN
60.0000 mg/h | INTRAVENOUS | Status: DC
  Filled 2021-02-04: qty 200

## 2021-02-04 MED ORDER — MORPHINE SULFATE (PF) 2 MG/ML IV SOLN
1.0000 mg | INTRAVENOUS | Status: DC | PRN
  Administered 2021-02-04 – 2021-02-05 (×3): 1 mg via INTRAVENOUS
  Filled 2021-02-04 (×3): qty 1

## 2021-02-04 MED ORDER — EPINEPHRINE 1 MG/10ML IJ SOSY
PREFILLED_SYRINGE | INTRAMUSCULAR | Status: AC | PRN
Start: 2021-02-04 — End: 2021-02-04
  Administered 2021-02-04: 1 mg via INTRAVENOUS

## 2021-02-04 MED ORDER — CHLORHEXIDINE GLUCONATE CLOTH 2 % EX PADS
6.0000 | MEDICATED_PAD | Freq: Every day | CUTANEOUS | Status: DC
  Administered 2021-02-04 – 2021-02-08 (×3): 6 via TOPICAL

## 2021-02-04 MED ORDER — ONDANSETRON HCL 4 MG/2ML IJ SOLN
4.0000 mg | Freq: Once | INTRAMUSCULAR | Status: AC
  Administered 2021-02-04: 4 mg via INTRAVENOUS

## 2021-02-04 MED ORDER — AMIODARONE HCL IN DEXTROSE 360-4.14 MG/200ML-% IV SOLN
30.0000 mg/h | INTRAVENOUS | Status: DC
  Administered 2021-02-04: 30 mg/h via INTRAVENOUS

## 2021-02-04 MED ORDER — AMIODARONE HCL IN DEXTROSE 360-4.14 MG/200ML-% IV SOLN
60.0000 mg/h | INTRAVENOUS | Status: AC
  Administered 2021-02-04 (×2): 60 mg/h via INTRAVENOUS
  Filled 2021-02-04: qty 200

## 2021-02-04 MED ORDER — AMIODARONE HCL IN DEXTROSE 360-4.14 MG/200ML-% IV SOLN: 30.0000 mg/h | INTRAVENOUS | Status: DC

## 2021-02-04 MED ORDER — AMIODARONE HCL 150 MG/3ML IV SOLN
INTRAVENOUS | Status: AC | PRN
Start: 2021-02-04 — End: 2021-02-04
  Administered 2021-02-04: 150 mg via INTRAVENOUS

## 2021-02-04 MED ORDER — MORPHINE SULFATE (PF) 2 MG/ML IV SOLN
1.0000 mg | Freq: Once | INTRAVENOUS | Status: AC
  Administered 2021-02-04: 1 mg via INTRAVENOUS
  Filled 2021-02-04: qty 1

## 2021-02-04 NOTE — ED Notes (Addendum)
Ambulated pt to restroom and back without issue. Upon arrival back to room, pt sat on edge of bed with legs dangling. As this RN started to get pt situated, pt leaned back and went unresponsive. EKG performed, pulses checked, CPR initiated. Dr Rennis Petty PA, nursing staff immediately to bedside.

## 2021-02-04 NOTE — Assessment & Plan Note (Signed)
Likely triggered by Celebrex and had not been taking Omeprazole  Recommend taking Omeprazole daily Will add Lipase today Follow up with PCP

## 2021-02-04 NOTE — Progress Notes (Signed)
PCCM note  CT head reviewed with no acute abnormality Right ventricle on bedside echo looks okay.  Formal read is pending  Discussed options for treatment in detail with patient and son, daughter.  We could consider full dose lytics but family wants to avoid and would prefer treatment with just heparin.  Reconsider if he deteriorates but he looks great now. Observe him in the ICU for 24 hours  Marshell Garfinkel MD  Pulmonary & Critical care See Amion for pager  If no response to pager , please call [phone] until 7pm After 7:00 pm call Elink  [phone] 02/04/2021, 2:46 PM

## 2021-02-04 NOTE — ED Provider Notes (Signed)
Blood pressure (!) 118/59, pulse 76, temperature 98.8 F (37.1 C), temperature source Oral, resp. rate (!) 32, SpO2 91 %.    In short, Arthur [name] is a 81 y.o. male with a chief complaint of No chief complaint on file. Marland Kitchen  Refer to the original H&P for additional details.  10:27 AM  Called to the patient's bedside with COVID alarm.  The bedside nurse is actively performing CPR on my arrival.  ACLS initiated with quality compressions.  Patient was placed on the ZOLL monitor and was given epinephrine.  Compressions resumed, but patient becoming more responsive and grabbing up into the air.  After pause for pulse check, the patient has a strong pulse and is up and talking.  He does look diaphoretic but is able to carry on a brief conversation with staff and his wife at bedside.  In discussion with the nurse, this apparently occurred just after returning from the bathroom where he urinated.  EKG, apparently performed just prior to losing pulses, showed ventricular tachycardia.  I initiated amiodarone bolus and infusion.  Patient is awake and protecting his airway.  He is not requiring intubation.  The patient's primary admit team came immediately to bedside and will consult cardiology as well as ICU.   Cardiopulmonary Resuscitation (CPR) Procedure Note Directed/Performed by: Margette Fast I personally directed ancillary staff and/or performed CPR in an effort to regain return of spontaneous circulation and to maintain cardiac, neuro and systemic perfusion.   CRITICAL CARE Performed by: Margette Fast Total critical care time: 35 minutes Critical care time was exclusive of separately billable procedures and treating other patients. Critical care was necessary to treat or prevent imminent or life-threatening deterioration. Critical care was time spent personally by me on the following activities: development of treatment plan with patient and/or surrogate as well as nursing, discussions with  consultants, evaluation of patient's response to treatment, examination of patient, obtaining history from patient or surrogate, ordering and performing treatments and interventions, ordering and review of laboratory studies, ordering and review of radiographic studies, pulse oximetry and re-evaluation of patient's condition.  Nanda Quinton, MD Emergency Medicine    Bayne Fosnaugh, [facility], MD 02/04/21 [phone]

## 2021-02-04 NOTE — Assessment & Plan Note (Signed)
No flare up today.  Had not been taking Allopurinol regularly. Continue Allopurinol daily Continue Colchicine Discontinue Celebrex Follow up with PCP as needed

## 2021-02-04 NOTE — Progress Notes (Signed)
FPTS Interim Progress Note  S: Paged to patient's room from ED staff.  ED staff reports the patient ambulated to the restroom and on the way back became short of breath, noticed chest tightness and fell to the floor.  No pulses were palpable so CPR was initiated.  He received a dose of epi and began to cough.  A week carotid pulses palpated but then this was lost.  CPR was restarted and carotid pulse returned.  On evaluation in the room the patient reports he is okay but the left side of his chest hurts which she thinks is probably from the CPR.  He is diaphoretic.    O: BP 121/65    Pulse 87    Temp 98.8 F (37.1 C) (Oral)    Resp (!) 26    SpO2 96%   General: 81 year old male looking uncomfortable in bed Cardiac: Tachycardic in the 130s on evaluation, appears to be in atrial fibrillation Respiratory: Tachypneic, oxygen saturation within normal limits  A/P: Cardiac arrest Saw patient with attending at bedside.  Spoke with the ED provider.  Appreciate emergency department staff for assistance in this patient's care.  Telemetry monitoring showing sustained ventricular tachycardia prior to episode.  Follow-up EKG showing atrial fibrillation.  Contacted cardiology as well as CCM for evaluation. - Chest x-ray showing old healed posterior fourth fifth and sixth rib fractures but no acute rib fractures -Continuing to trend troponins at this time - Continue amiodarone drip which was initiated by ED provider - Cardiology consulted, appreciate recommendations - CCM consulted, appreciate their assistance in this patient's care - Continue cardiac monitoring  Gifford Shave, MD 02/04/2021, 11:10 AM PGY-3, [place] [facility] Service pager [phone]

## 2021-02-04 NOTE — Consult Note (Addendum)
NAME:  Arthur [name], MRN:  [id number], DOB:  1940/07/23, LOS: 0 ADMISSION DATE:  02/03/2021, CONSULTATION DATE: 02/04/2021 REFERRING MD: Madison Hickman, MD, CHIEF COMPLAINT: Pulmonary embolism, pulmonary nodule, cardiac arrest  History of Present Illness:  81 year old with history of hypertension, diabetes, hyperlipidemia, gout, GERD Admitted with shortness of breath found to have right lung pulmonary embolism and lung nodules, lymphadenopathy.  He was started on heparin drip for anticoagulation.  Today morning he had syncope and cardiac arrest after he got up to use the toilet.  Initial rhythm is unclear but there is atrial fibrillation and vtach on telemetry  He is now neurologically intact, hemodynamically stable.  Does complain of chest tightness, shortness of breath and chest pain. Of note he had an episode of DVT PE in 2017 which seemed unprovoked. He was treated with anticoagulation at that time which was not continued long-term.  He is a non-smoker with no malignancy history  Pertinent  Medical History    has a past medical history of Diabetes mellitus without complication ([place]), High cholesterol, Hypertension, and Normal nuclear stress test (2010).   Significant Hospital Events: Including procedures, antibiotic start and stop dates in addition to other pertinent events   1/7 admit 1/8 brief cardiac arrest  Interim History / Subjective:    Objective   Blood pressure 117/63, pulse 72, temperature 98.8 F (37.1 C), temperature source Oral, resp. rate (!) 21, SpO2 97 %.       No intake or output data in the 24 hours ending 02/04/21 1245 There were no vitals filed for this visit.  Examination: Blood pressure 117/63, pulse 72, temperature 98.8 F (37.1 C), temperature source Oral, resp. rate (!) 21, SpO2 97 %. Gen:      No acute distress HEENT:  EOMI, sclera anicteric Neck:     No masses; no thyromegaly Lungs:    Clear to auscultation bilaterally; normal respiratory  effort CV:         Regular rate and rhythm; no murmurs Abd:      + bowel sounds; soft, non-tender; no palpable masses, no distension Ext:    No edema; adequate peripheral perfusion Skin:      Warm and dry; no rash Neuro: alert and oriented x 3 Psych: normal mood and affect   Resolved Hospital Problem list     Assessment & Plan:  PE Cardiac arrest Currently is hemodynamically stable and neurologically intact Although the clot does not appear to be large he does meet the criteria for massive PE due to syncope, cardiac arrest though it is rare get VT arrest from pulmonary embolism Ordered CT head.  If no abnormalities then consider systemic lytics Stat echo to evaluate RV function He is not a candidate for IR interventions given peripheral location of the clot  Lung nodules, lymphadenopathy Concerning for malignancy.  Will need work-up as an outpatient once he is stabilized  Admit to ICU.  Best Practice (right click and "Reselect all SmartList Selections" daily)   Diet/type: NPO DVT prophylaxis: systemic heparin GI prophylaxis: N/A Lines: N/A Foley:  N/A Code Status:  full code Last date of multidisciplinary goals of care discussion [1/8].  Discussed with patient and family.  Full code   Labs   CBC: Recent Labs  Lab 02/03/21 1412 02/04/21 0415  WBC 12.5* 12.7*  NEUTROABS  --  7.6  HGB 12.6* 12.3*  HCT 40.6 38.6*  MCV 84.6 82.8  PLT 258 600    Basic Metabolic Panel: Recent Labs  Lab  02/01/21 1100 02/03/21 1412 02/04/21 0415  NA 141 137 135  K 4.1 3.9 3.4*  CL 101 99 95*  CO2 26 27 27   GLUCOSE 131* 129* 124*  BUN 17 13 13   CREATININE 1.20 1.17 1.33*  CALCIUM 9.2 9.1 8.9   GFR: Estimated Creatinine Clearance: 46.6 mL/min (A) (by C-G formula based on SCr of 1.33 mg/dL (H)). Recent Labs  Lab 02/03/21 1412 02/04/21 0415  WBC 12.5* 12.7*    Liver Function Tests: Recent Labs  Lab 02/01/21 1100  AST 17  ALT 10  ALKPHOS 189*  BILITOT 0.7  PROT 7.4   ALBUMIN 4.0   Recent Labs  Lab 02/01/21 1100  LIPASE 24   No results for input(s): AMMONIA in the last 168 hours.  ABG No results found for: PHART, PCO2ART, PO2ART, HCO3, TCO2, ACIDBASEDEF, O2SAT   Coagulation Profile: Recent Labs  Lab 02/03/21 2119  INR 1.3*    Cardiac Enzymes: No results for input(s): CKTOTAL, CKMB, CKMBINDEX, TROPONINI in the last 168 hours.  HbA1C: HbA1c, POC (controlled diabetic range)  Date/Time Value Ref Range Status  01/16/2021 08:59 AM 6.3 0.0 - 7.0 % Final  10/18/2020 08:50 AM 6.4 0.0 - 7.0 % Final    CBG: No results for input(s): GLUCAP in the last 168 hours.  Review of Systems:   REVIEW OF SYSTEMS:   All negative; except for those that are bolded, which indicate positives.  Constitutional: weight loss, weight gain, night sweats, fevers, chills, fatigue, weakness.  HEENT: headaches, sore throat, sneezing, nasal congestion, post nasal drip, difficulty swallowing, tooth/dental problems, visual complaints, visual changes, ear aches. Neuro: difficulty with speech, weakness, numbness, ataxia. CV:  chest pain, orthopnea, PND, swelling in lower extremities, dizziness, palpitations, syncope.  Resp: cough, hemoptysis, dyspnea, wheezing. GI: heartburn, indigestion, abdominal pain, nausea, vomiting, diarrhea, constipation, change in bowel habits, loss of appetite, hematemesis, melena, hematochezia.  GU: dysuria, change in color of urine, urgency or frequency, flank pain, hematuria. MSK: joint pain or swelling, decreased range of motion. Psych: change in mood or affect, depression, anxiety, suicidal ideations, homicidal ideations. Skin: rash, itching, bruising.   Past Medical History:  He,  has a past medical history of Diabetes mellitus without complication ([place]), High cholesterol, Hypertension, and Normal nuclear stress test (2010).   Surgical History:   Past Surgical History:  Procedure Laterality Date   CATARACT EXTRACTION, BILATERAL  2006    KNEE SURGERY  2005     Social History:   reports that he has never smoked. He has never used smokeless tobacco. He reports that he does not drink alcohol and does not use drugs.   Family History:  His family history includes Coronary artery disease in his father and mother.   Allergies Allergies  Allergen Reactions   Ace Inhibitors     REACTION: Cough     Home Medications  Prior to Admission medications   Medication Sig Start Date End Date Taking? Authorizing Provider  acetaminophen (TYLENOL) 650 MG CR tablet Take 650 mg by mouth every 8 (eight) hours as needed for pain.   Yes [provider]  allopurinol (ZYLOPRIM) 100 MG tablet Take 1 tablet (100 mg total) by mouth daily. 01/31/21  Yes Chambliss, Jeb Levering, MD  amLODipine (NORVASC) 5 MG tablet Take 1 tablet (5 mg total) by mouth at bedtime. 01/31/21  Yes Chambliss, Jeb Levering, MD  atenolol (TENORMIN) 50 MG tablet Take 1 tablet (50 mg total) by mouth 2 (two) times daily. 01/31/21  Yes Chambliss, Jeb Levering,  MD  benzonatate (TESSALON) 100 MG capsule TAKE 1 CAPSULE BY MOUTH TWICE DAILY AS NEEDED FOR COUGH Patient taking differently: Take 100 mg by mouth 3 (three) times daily as needed for cough. 02/01/21  Yes Chambliss, Jeb Levering, MD  colchicine 0.6 MG tablet Take 1 tablet (0.6 mg total) by mouth 2 (two) times daily. 01/31/21  Yes Chambliss, Jeb Levering, MD  diclofenac Sodium (VOLTAREN) 1 % GEL Apply 2 g topically 4 (four) times daily. Patient taking differently: Apply 2-4 g topically 4 (four) times daily as needed (knees/legs). 01/31/21  Yes Chambliss, Jeb Levering, MD  ferrous sulfate 324 (65 Fe) MG TBEC Take 324 mg by mouth daily. 08/17/20  Yes Chambliss, Jeb Levering, MD  hydrochlorothiazide (HYDRODIURIL) 12.5 MG tablet TAKE 1 TABLET BY MOUTH ONCE DAILY **NEW  LOWER  STRENGTH** Patient taking differently: Take 12.5 mg by mouth daily. 01/31/21  Yes Lind Covert, MD  loratadine (CLARITIN) 10 MG tablet Take 1 tablet (10 mg total) by  mouth daily. Patient taking differently: Take 10 mg by mouth daily as needed for allergies. 02/12/20  Yes Benay Pike, MD  losartan (COZAAR) 100 MG tablet Take 1 tablet (100 mg total) by mouth daily. 01/31/21  Yes Lind Covert, MD  metFORMIN (GLUCOPHAGE) 1000 MG tablet Take 1 tablet (1,000 mg total) by mouth 2 (two) times daily with a meal. 01/31/21  Yes Chambliss, Jeb Levering, MD  Multiple Vitamins-Minerals (CENTRUM SILVER 50+MEN) TABS Take 1 tablet by mouth daily.   Yes [provider]  omeprazole (PRILOSEC) 20 MG capsule TAKE 1 CAPSULE BY MOUTH ONCE DAILY AS NEEDED Patient taking differently: Take 20 mg by mouth daily as needed (heartburn). 02/01/21  Yes Lind Covert, MD  pravastatin (PRAVACHOL) 40 MG tablet Take 1 tablet (40 mg total) by mouth daily. Patient taking differently: Take 40 mg by mouth at bedtime. 01/31/21  Yes Chambliss, Jeb Levering, MD  celecoxib (CELEBREX) 200 MG capsule Take 200 mg by mouth daily as needed. Patient not taking: Reported on 12/20/2020    [provider]  trolamine salicylate (ASPER-FLEX) 10 % cream Apply 1 application topically as needed for muscle pain. Patient not taking: Reported on 03/08/2020 04/08/18   Lind Covert, MD     Critical care time:    The patient is critically ill with multiple organ system failure and requires high complexity decision making for assessment and support, frequent evaluation and titration of therapies, advanced monitoring, review of radiographic studies and interpretation of complex data.   Critical Care Time devoted to patient care services, exclusive of separately billable procedures, described in this note is 45 minutes.   Marshell Garfinkel MD [facility] See Amion for pager  If no response to pager , please call [phone] until 7pm After 7:00 pm call Elink  [phone] 02/04/2021, 12:58 PM

## 2021-02-04 NOTE — Progress Notes (Signed)
Family Medicine Teaching Service Daily Progress Note Intern Pager: [phone]  Patient name: Arthur [name] Medical record number: [id number] Date of birth: 05/31/1940 Age: 81 y.o. Gender: male  Primary Care Provider: Lind Covert, MD Consultants: None Code Status: Full  Pt Overview and Major Events to Date:  1/7 - Admitted with pulmonary emboli  Assessment and Plan: Arthur [name] is a 81 y.o. male presenting with shortness of breath found to have pulmonary emboli and nodules. PMH is significant for HTN, T2DM, HLD, gout, GERD, Hx of pulmonary embolus and DVT  Multiple pulmonary emboli [name] vitals remain stable and he has not required any oxygen since admission. Troponin's were elevated at 262>283, likely elevated in the setting of the emboli but would like to see a down-trending pattern. Given stability and grossly normal EKG, do not feel that cardiology consult is necessary at this time. - Continuous cardiac and O2 monitoring - Supplemental O2 as needed - Heparin GTT per pharmacy  - Trend troponin  Pulmonary nodules   Unintentional weight loss Urine studies showed hematuria with 6-10 RBCs. May need to consider further outpatient evaluation with urology if persistent for cystoscopy. Will want to evaluate further for a source with CT abdomen/pelvis with contrast, but defer this to avoid repeated close exposure to contrast.  - Consider CT abdomen/pelvis in the coming days  Leukocytosis, stable WBC stable at 12.5>12.7, likely elevated in the setting of pulmonary emboli. Have not obtained further infectious work-up due to lack of symptoms. - Continue to monitor with CBC  HTN Currently normotensive. On home medications of amlodipine, atenolol, HCTZ, and losartan - Continue home medications - Closely monitor blood pressures  T2DM Remains stable. Glucose on BMP was 124.  - Continue to hold metformin   FEN/GI: Regular PPx: Heparin gtt Dispo:Home in 2-3 days.  Barriers include further medical work-up.   Subjective:  Patient reports that the stretchers in the ER are uncomfortable and thinks that is causing some of his abdominal discomfort. He does not have any shortness of breath at this time and no chest pain.   Objective: Temp:  [98.4 F (36.9 C)-99 F (37.2 C)] 98.4 F (36.9 C) (01/07 1541) Pulse Rate:  [65-85] 72 (01/08 0530) Resp:  [15-26] 21 (01/08 0530) BP: (105-168)/(60-81) 130/70 (01/08 0530) SpO2:  [90 %-99 %] 93 % (01/08 0530) Physical Exam: General: NAD, laying in bed, daughter at bedside Cardiovascular: RRR Respiratory: breathing comfortably on room air, saturations >92% persistently, able to speak in full sentences Abdomen: soft,  non-distended, bowel sounds present Extremities: no peripheral edema  Laboratory: Recent Labs  Lab 02/03/21 1412 02/04/21 0415  WBC 12.5* 12.7*  HGB 12.6* 12.3*  HCT 40.6 38.6*  PLT 258 280   Recent Labs  Lab 02/01/21 1100 02/03/21 1412 02/04/21 0415  NA 141 137 135  K 4.1 3.9 3.4*  CL 101 99 95*  CO2 26 27 27   BUN 17 13 13   CREATININE 1.20 1.17 1.33*  CALCIUM 9.2 9.1 8.9  PROT 7.4  --   --   BILITOT 0.7  --   --   ALKPHOS 189*  --   --   ALT 10  --   --   AST 17  --   --   GLUCOSE 131* 129* 124*      Imaging/Diagnostic Tests: DG Chest 2 View  Result Date: 02/03/2021 CLINICAL DATA:  Shortness of breath EXAM: CHEST - 2 VIEW COMPARISON:  Chest radiograph Jun 21, 2020 FINDINGS: The heart size and  mediastinal contours are within normal limits. Diffuse bilateral prominent interstitial markings concerning for chronic interstitial lung disease or acute bronchitis/bronchiolitis. No definite focal consolidation. Multiple chronic healed left-sided rib fractures. IMPRESSION: Prominent bilateral interstitial markings which may represent chronic interstitial lung disease or acute viral bronchiolitis. No focal consolidation. Clinical correlation is suggested. Electronically Signed   By: Keane Police D.O.   On: 02/03/2021 15:04   CT Angio Chest PE W and/or Wo Contrast  Addendum Date: 02/03/2021   ADDENDUM REPORT: 02/03/2021 23:07 ADDENDUM: Mild soft tissue pleural thickening at the right posterior lung apex, series 5, image 30 is indeterminate for pleural disease given findings of lung nodules. Electronically Signed   By: Donavan Foil M.D.   On: 02/03/2021 23:07   Result Date: 02/03/2021 CLINICAL DATA:  Shortness of breath EXAM: CT ANGIOGRAPHY CHEST WITH CONTRAST TECHNIQUE: Multidetector CT imaging of the chest was performed using the standard protocol during bolus administration of intravenous contrast. Multiplanar CT image reconstructions and MIPs were obtained to evaluate the vascular anatomy. CONTRAST:  36mL OMNIPAQUE IOHEXOL 350 MG/ML SOLN COMPARISON:  Chest x-ray 02/03/2021, CT chest 05/18/2015 FINDINGS: Cardiovascular: Satisfactory opacification of the pulmonary arteries to the segmental level. Acute thrombus within right middle lobe subsegmental vessels. Thrombus also present within right upper lobe segmental and subsegmental vessels. Small amount of peripheral thrombus within the distal right pulmonary artery. No evidence for right heart strain. Mild aortic atherosclerosis. No aneurysm. Coronary vascular calcification. Borderline to mild cardiomegaly. No pericardial effusion. Mediastinum/Nodes: Midline trachea. No thyroid mass. Enlarged right paratracheal lymph node measuring 13 mm. AP window lymph node measuring 18 mm. Right hilar node measuring 13 mm. Subcarinal lymph node measuring 14 mm. Esophagus within normal limits. Lungs/Pleura: Emphysema. Interim development of multiple pulmonary nodules. 8 mm right upper lobe pulmonary nodule, series 6 image 49. 18 mm right lower lobe lung nodule, series 6, image 75. 8 mm left lower lobe pulmonary nodule, series 6, image 116. Upper Abdomen: No acute abnormality. 15 mm right adrenal nodule, unchanged and felt consistent with adenoma. Small lymph  nodes adjacent to the aorta, at the porta hepatis and in the gastrohepatic region. Cyst upper pole right kidney. Small right kidney stone Musculoskeletal: No acute or suspicious osseous abnormality.Multiple remote left rib fractures. Review of the MIP images confirms the above findings. IMPRESSION: 1. Positive for acute pulmonary emboli within the distal right pulmonary artery and segmental and subsegmental right upper and middle lobe pulmonary vessels. Negative for right heart strain. 2. Interim development of multiple pulmonary nodules measuring up to 18 mm in the right lower lobe suspicious for metastatic disease. Mediastinal and hilar adenopathy, also suspect for metastatic disease. Critical Value/emergent results were called by telephone at the time of interpretation on 02/03/2021 at 7:25 pm to provider [facility] RAY , who verbally acknowledged these results. Aortic Atherosclerosis (ICD10-I70.0) and Emphysema (ICD10-J43.9). Electronically Signed: By: Donavan Foil M.D. On: 02/03/2021 19:26     Rise Patience, DO 02/04/2021, 6:08 AM PGY-2, [place] Intern pager: [phone], text pages welcome

## 2021-02-04 NOTE — Assessment & Plan Note (Addendum)
Medications reviewed and current Continue current medications. PCP has already sent in refill for medications CMet today Follow up with PCP

## 2021-02-04 NOTE — Consult Note (Signed)
Cardiology Consultation:   Patient ID: Arthur [name] MRN: [id number]; DOB: [birth year]  Admit date: 02/03/2021 Date of Consult: 02/04/2021  PCP:  Lind Covert, MD   [facility] Providers Cardiologist:  None   New, Dr. Harrell Gave }   Patient Profile:   Arthur [name] is a 81 y.o. male with a hx of DVT/PE, pulmonary nodules, HTN, DM, HLD, GERD who is being seen 02/04/2021 for the evaluation of cardiac arrest/WCT at the request of Dr. Andria Frames.  History of Present Illness:   [name] is an 81 yo male with PMH noted above. Reports being seen by cardiology remotely in the past. Does not recall any prior work up with cath, maybe a stress test. Did have an echo in 2017 in the setting of admission of PE with LVEF of 55-60%, G1DD, severely reduced RV, PA pressure of 54 mmHg. Was discharged on Xarelto  Presented to the ED 1/7 with several days of increased dyspnea on exertion. In the ED labs showed NA+ 137, K+ 3.9>>3.4, Cr 1.17, BNP 530, hsTn 262>>282>>260, WBC 12.5, Hgb 12.6, Ddimer >20. CT angio with PE within the distal right pulmonary artery, segmental and subsegmental right upper and middle lobe. Negative for right heart strain. Increased right lower lobe nodule of 19mm concerning for metastatic disease. He was started IV heparin and admitted to IM for further management.   While waiting for admission in the ED, he got up to ambulate to the bathroom. Once getting back to the room he c/o shortness of breath, chest tightness and became unresponsive/pulseless. Received CPR, epi and obtained ROSC after 5 minutes. EKG done just prior to arrest showed WCT concerning for VT. Once regained pulse, he was awake and protecting his airway. Started on IV amiodarone with bolus. Cardiology called for further evaluation.   In talking with patient he denies any chest pain prior to admission. Non-smoker. Complaint with home medications. Has been diabetic for the past 20 years, but only needed metformin.    Review of telemetry noted WCT consistent with VT, then into a PEA rhythm, asystole followed by a narrow complex tachycardia with ST depressions, then back to SR.   At the time of interview he is sitting up in bed, denies any specific complaint. Family at bedside.    Past Medical History:  Diagnosis Date   Diabetes mellitus without complication ([place])    High cholesterol    Hypertension    Normal nuclear stress test 2010   stress perfusion study apparently in 2010 in [facility] which he said was negative.    Past Surgical History:  Procedure Laterality Date   CATARACT EXTRACTION, BILATERAL  2006   KNEE SURGERY  2005     Home Medications:  Prior to Admission medications   Medication Sig Start Date End Date Taking? Authorizing Provider  acetaminophen (TYLENOL) 650 MG CR tablet Take 650 mg by mouth every 8 (eight) hours as needed for pain.   Yes [provider]  allopurinol (ZYLOPRIM) 100 MG tablet Take 1 tablet (100 mg total) by mouth daily. 01/31/21  Yes Chambliss, Jeb Levering, MD  amLODipine (NORVASC) 5 MG tablet Take 1 tablet (5 mg total) by mouth at bedtime. 01/31/21  Yes Chambliss, Jeb Levering, MD  atenolol (TENORMIN) 50 MG tablet Take 1 tablet (50 mg total) by mouth 2 (two) times daily. 01/31/21  Yes Chambliss, Jeb Levering, MD  benzonatate (TESSALON) 100 MG capsule TAKE 1 CAPSULE BY MOUTH TWICE DAILY AS NEEDED FOR COUGH Patient taking differently:  Take 100 mg by mouth 3 (three) times daily as needed for cough. 02/01/21  Yes Chambliss, Jeb Levering, MD  colchicine 0.6 MG tablet Take 1 tablet (0.6 mg total) by mouth 2 (two) times daily. 01/31/21  Yes Chambliss, Jeb Levering, MD  diclofenac Sodium (VOLTAREN) 1 % GEL Apply 2 g topically 4 (four) times daily. Patient taking differently: Apply 2-4 g topically 4 (four) times daily as needed (knees/legs). 01/31/21  Yes Chambliss, Jeb Levering, MD  ferrous sulfate 324 (65 Fe) MG TBEC Take 324 mg by mouth daily. 08/17/20  Yes Chambliss,  Jeb Levering, MD  hydrochlorothiazide (HYDRODIURIL) 12.5 MG tablet TAKE 1 TABLET BY MOUTH ONCE DAILY **NEW  LOWER  STRENGTH** Patient taking differently: Take 12.5 mg by mouth daily. 01/31/21  Yes Lind Covert, MD  loratadine (CLARITIN) 10 MG tablet Take 1 tablet (10 mg total) by mouth daily. Patient taking differently: Take 10 mg by mouth daily as needed for allergies. 02/12/20  Yes Benay Pike, MD  losartan (COZAAR) 100 MG tablet Take 1 tablet (100 mg total) by mouth daily. 01/31/21  Yes Lind Covert, MD  metFORMIN (GLUCOPHAGE) 1000 MG tablet Take 1 tablet (1,000 mg total) by mouth 2 (two) times daily with a meal. 01/31/21  Yes Chambliss, Jeb Levering, MD  Multiple Vitamins-Minerals (CENTRUM SILVER 50+MEN) TABS Take 1 tablet by mouth daily.   Yes [provider]  omeprazole (PRILOSEC) 20 MG capsule TAKE 1 CAPSULE BY MOUTH ONCE DAILY AS NEEDED Patient taking differently: Take 20 mg by mouth daily as needed (heartburn). 02/01/21  Yes Lind Covert, MD  pravastatin (PRAVACHOL) 40 MG tablet Take 1 tablet (40 mg total) by mouth daily. Patient taking differently: Take 40 mg by mouth at bedtime. 01/31/21  Yes Chambliss, Jeb Levering, MD  celecoxib (CELEBREX) 200 MG capsule Take 200 mg by mouth daily as needed. Patient not taking: Reported on 12/20/2020    [provider]  trolamine salicylate (ASPER-FLEX) 10 % cream Apply 1 application topically as needed for muscle pain. Patient not taking: Reported on 03/08/2020 04/08/18   Lind Covert, MD    Inpatient Medications: Scheduled Meds:  allopurinol  100 mg Oral Daily   amLODipine  5 mg Oral QHS   atenolol  50 mg Oral BID   colchicine  0.6 mg Oral BID   hydrochlorothiazide  12.5 mg Oral Daily   losartan  100 mg Oral Daily   pravastatin  40 mg Oral Daily   Continuous Infusions:  heparin 1,200 Units/hr (02/04/21 0942)   PRN Meds: albuterol, loratadine, morphine injection  Allergies:    Allergies  Allergen  Reactions   Ace Inhibitors     REACTION: Cough    Social History:   Social History   Socioeconomic History   Marital status: Married    Spouse name: Not on file   Number of children: Not on file   Years of education: Not on file   Highest education level: Not on file  Occupational History   Not on file  Tobacco Use   Smoking status: Never   Smokeless tobacco: Never  Substance and Sexual Activity   Alcohol use: No   Drug use: No   Sexual activity: Not on file  Other Topics Concern   Not on file  Social History Narrative   Social History:   He works part-time as a Doctor, general practice. He is married with 3 children. He's never smoked cigarettes and does not drink alcohol.  Social Determinants of Health   Financial Resource Strain: Not on file  Food Insecurity: Not on file  Transportation Needs: Not on file  Physical Activity: Not on file  Stress: Not on file  Social Connections: Not on file  Intimate Partner Violence: Not on file    Family History:    Family History  Problem Relation Age of Onset   Coronary artery disease Father    Coronary artery disease Mother      ROS:  Please see the history of present illness.   All other ROS reviewed and negative.     Physical Exam/Data:   Vitals:   02/04/21 1100 02/04/21 1115 02/04/21 1130 02/04/21 1200  BP: 121/65 116/62 113/63 117/67  Pulse: 87 83 81 77  Resp: (!) 26 (!) 26 (!) 24 (!) 22  Temp:      TempSrc:      SpO2: 96% 95% 96% 96%   No intake or output data in the 24 hours ending 02/04/21 1217 Last 3 Weights 02/01/2021 01/16/2021 12/20/2020  Weight (lbs) 163 lb 12.8 oz 166 lb 3.2 oz 166 lb 6.4 oz  Weight (kg) 74.299 kg 75.388 kg 75.479 kg     There is no height or weight on file to calculate BMI.  General:  Well nourished, well developed, in no acute distress HEENT: normal Neck: no JVD Vascular: No carotid bruits; Distal pulses 2+ bilaterally Cardiac:  normal S1, S2; RRR; no murmur  Lungs: wheezing  bilaterally Abd: soft, nontender, no hepatomegaly  Ext: no edema Musculoskeletal:  No deformities, BUE and BLE strength normal and equal Skin: warm and dry  Neuro:  CNs 2-12 intact, no focal abnormalities noted Psych:  Normal affect   EKG:  The EKG was personally reviewed and demonstrates:   02/03/2021: SR with 1st degree AVB 02/04/2021: WCT consistent with VT Telemetry:  Telemetry was personally reviewed and demonstrates:   WCT consistent with VT, then into a PEA rhythm, asystole followed by a narrow complex tachycardia with ST depressions, then back to SR.   Relevant CV Studies:  Echo: 04/2015  Study Conclusions   - Left ventricle: The cavity size was normal. There was mild focal    basal hypertrophy of the septum. Systolic function was normal.    The estimated ejection fraction was in the range of 55% to 60%.    Wall motion was normal; there were no regional wall motion    abnormalities. Doppler parameters are consistent with abnormal    left ventricular relaxation (grade 1 diastolic dysfunction).  - Ventricular septum: The contour showed diastolic flattening and    systolic flattening.  - Right ventricle: The cavity size was mildly dilated. Systolic    function was severely reduced.  - Right atrium: The atrium was mildly dilated.  - Pulmonary arteries: Systolic pressure was moderately to severely    increased. PA peak pressure: 54 mm Hg (S).   Impressions:   - Technically difficult; normal LV systolic function; grade 1    diastolic dysfunction; mild RAE/RVE; severely reduced RV    function; mild TR with moderate to severe elevation in pulmonary    pressure.   Laboratory Data:  High Sensitivity Troponin:   Recent Labs  Lab 02/03/21 2119 02/04/21 0130 02/04/21 0620 02/04/21 1039  TROPONINIHS 262* 283* 260* 212*     Chemistry Recent Labs  Lab 02/01/21 1100 02/03/21 1412 02/04/21 0415  NA 141 137 135  K 4.1 3.9 3.4*  CL 101 99 95*  CO2 26 27  27  GLUCOSE 131*  129* 124*  BUN 17 13 13   CREATININE 1.20 1.17 1.33*  CALCIUM 9.2 9.1 8.9  GFRNONAA  --  >60 54*  ANIONGAP  --  11 13    Recent Labs  Lab 02/01/21 1100  PROT 7.4  ALBUMIN 4.0  AST 17  ALT 10  ALKPHOS 189*  BILITOT 0.7   Lipids No results for input(s): CHOL, TRIG, HDL, LABVLDL, LDLCALC, CHOLHDL in the last 168 hours.  Hematology Recent Labs  Lab 02/03/21 1412 02/04/21 0415  WBC 12.5* 12.7*  RBC 4.80 4.66  HGB 12.6* 12.3*  HCT 40.6 38.6*  MCV 84.6 82.8  MCH 26.3 26.4  MCHC 31.0 31.9  RDW 13.7 14.0  PLT 258 280   Thyroid No results for input(s): TSH, FREET4 in the last 168 hours.  BNP Recent Labs  Lab 02/03/21 1643  BNP 530.8*    DDimer  Recent Labs  Lab 02/03/21 1642  DDIMER >20.00*     Radiology/Studies:  DG Chest 2 View  Result Date: 02/03/2021 CLINICAL DATA:  Shortness of breath EXAM: CHEST - 2 VIEW COMPARISON:  Chest radiograph Jun 21, 2020 FINDINGS: The heart size and mediastinal contours are within normal limits. Diffuse bilateral prominent interstitial markings concerning for chronic interstitial lung disease or acute bronchitis/bronchiolitis. No definite focal consolidation. Multiple chronic healed left-sided rib fractures. IMPRESSION: Prominent bilateral interstitial markings which may represent chronic interstitial lung disease or acute viral bronchiolitis. No focal consolidation. Clinical correlation is suggested. Electronically Signed   By: Keane Police D.O.   On: 02/03/2021 15:04   CT Angio Chest PE W and/or Wo Contrast  Addendum Date: 02/03/2021   ADDENDUM REPORT: 02/03/2021 23:07 ADDENDUM: Mild soft tissue pleural thickening at the right posterior lung apex, series 5, image 30 is indeterminate for pleural disease given findings of lung nodules. Electronically Signed   By: Donavan Foil M.D.   On: 02/03/2021 23:07   Result Date: 02/03/2021 CLINICAL DATA:  Shortness of breath EXAM: CT ANGIOGRAPHY CHEST WITH CONTRAST TECHNIQUE: Multidetector CT imaging  of the chest was performed using the standard protocol during bolus administration of intravenous contrast. Multiplanar CT image reconstructions and MIPs were obtained to evaluate the vascular anatomy. CONTRAST:  58mL OMNIPAQUE IOHEXOL 350 MG/ML SOLN COMPARISON:  Chest x-ray 02/03/2021, CT chest 05/18/2015 FINDINGS: Cardiovascular: Satisfactory opacification of the pulmonary arteries to the segmental level. Acute thrombus within right middle lobe subsegmental vessels. Thrombus also present within right upper lobe segmental and subsegmental vessels. Small amount of peripheral thrombus within the distal right pulmonary artery. No evidence for right heart strain. Mild aortic atherosclerosis. No aneurysm. Coronary vascular calcification. Borderline to mild cardiomegaly. No pericardial effusion. Mediastinum/Nodes: Midline trachea. No thyroid mass. Enlarged right paratracheal lymph node measuring 13 mm. AP window lymph node measuring 18 mm. Right hilar node measuring 13 mm. Subcarinal lymph node measuring 14 mm. Esophagus within normal limits. Lungs/Pleura: Emphysema. Interim development of multiple pulmonary nodules. 8 mm right upper lobe pulmonary nodule, series 6 image 49. 18 mm right lower lobe lung nodule, series 6, image 75. 8 mm left lower lobe pulmonary nodule, series 6, image 116. Upper Abdomen: No acute abnormality. 15 mm right adrenal nodule, unchanged and felt consistent with adenoma. Small lymph nodes adjacent to the aorta, at the porta hepatis and in the gastrohepatic region. Cyst upper pole right kidney. Small right kidney stone Musculoskeletal: No acute or suspicious osseous abnormality.Multiple remote left rib fractures. Review of the MIP images confirms the above findings. IMPRESSION: 1.  Positive for acute pulmonary emboli within the distal right pulmonary artery and segmental and subsegmental right upper and middle lobe pulmonary vessels. Negative for right heart strain. 2. Interim development of  multiple pulmonary nodules measuring up to 18 mm in the right lower lobe suspicious for metastatic disease. Mediastinal and hilar adenopathy, also suspect for metastatic disease. Critical Value/emergent results were called by telephone at the time of interpretation on 02/03/2021 at 7:25 pm to provider [facility] , who verbally acknowledged these results. Aortic Atherosclerosis (ICD10-I70.0) and Emphysema (ICD10-J43.9). Electronically Signed: By: Donavan Foil M.D. On: 02/03/2021 19:26   DG Chest Portable 1 View  Addendum Date: 02/04/2021   ADDENDUM REPORT: 02/04/2021 11:15 ADDENDUM: There are old, healed posterior left fourth, fifth and sixth rib fractures. No convincing acute rib fractures. Electronically Signed   By: Lajean Manes M.D.   On: 02/04/2021 11:15   Result Date: 02/04/2021 CLINICAL DATA:  Post CPR. EXAM: PORTABLE CHEST 1 VIEW COMPARISON:  02/03/2021 and older studies. FINDINGS: Cardiac silhouette is normal in size. No mediastinal or hilar masses. Prominent bronchovascular/interstitial markings, stable. No lung consolidation or edema. No convincing pleural effusion and no pneumothorax. Right lower lobe nodule noted on the previous day's CT is not defined. Skeletal structures are grossly intact. IMPRESSION: No acute cardiopulmonary disease. Electronically Signed: By: Lajean Manes M.D. On: 02/04/2021 10:46     Assessment and Plan:   Eldredge R Sobecki is a 81 y.o. male with a hx of DVT/PE, pulmonary nodules, HTN, DM, HLD, GERD who is being seen 02/04/2021 for the evaluation of cardiac arrest/WCT at the request of Dr. Andria Frames.  Cardiac Arrest: in the setting of VT requiring several minutes of CPR, epi but no defibs. Denies any anginal symptoms prior to admission. Does have CRFs for CAD, >20 yrs DM, HTN, HLD therefore will need further work up with ischemic evaluation. Briefly reviewed cardiac cath and indication for such.  -- check echo -- lipids, Hgb A1c  -- IV heparin, add IV amiodarone  PE  with hx of same in 2017: treated with Xarelto for 6 months then stopped. No issues since that time -- CT angio with multiple PEs -- IV heparin -- PCCM consulted via primary  Hx of enlarged RV/elevated PA pressures: noted on echo back 2017 in setting of PE. No follow up echo since that time -- echo pending  Pulmonary nodules: 51mm right lower lobe suspicious for metastatic disease  -- PCCM eval  HTN: stable -- on atenolol 50mg  BID, losartan 100mg  daily ( will hold with increased Cr this morning)  DM: metformin PTA -- SSI while inpatient   HLD: on pravastatin PTA -- check lipids  Hypokalemia: K+ 3.4 this morning -- hold HCTZ -- check mag  Renal insufficieny vs AKI: Cr 1.17>>1.33 -- hold HCTZ, losartan for now  Risk Assessment/Risk Scores:          For questions or updates, please contact [facility] Please consult [email or web address] for contact info under    Signed, Reino Bellis, NP  02/04/2021 12:17 PM

## 2021-02-04 NOTE — Progress Notes (Signed)
ANTICOAGULATION CONSULT NOTE - Follow Up Consult  Pharmacy Consult for heparin Indication: pulmonary embolus  Labs: Recent Labs    02/01/21 1100 02/03/21 1412 02/03/21 2119 02/04/21 0130 02/04/21 0415  HGB  --  12.6*  --   --  12.3*  HCT  --  40.6  --   --  38.6*  PLT  --  258  --   --  280  APTT  --   --  33  --   --   LABPROT  --   --  16.2*  --   --   INR  --   --  1.3*  --   --   HEPARINUNFRC  --   --   --   --  0.59  CREATININE 1.20 1.17  --   --  1.33*  TROPONINIHS  --   --  262* 283*  --     Assessment/Plan:  81yo male therapeutic on heparin with initial dosing for PE. Will continue infusion at current rate of 1200 units/hr and confirm stable with additional level.   Wynona Neat, PharmD, BCPS  02/04/2021,5:36 AM

## 2021-02-04 NOTE — ED Notes (Signed)
Provided pt water.

## 2021-02-04 NOTE — Progress Notes (Signed)
ANTICOAGULATION CONSULT NOTE   Pharmacy Consult for heparin Indication: pulmonary embolus  Allergies  Allergen Reactions   Ace Inhibitors     REACTION: Cough    Patient Measurements:   Heparin Dosing Weight: TBW  Vital Signs: Temp: 98.8 F (37.1 C) (01/08 0752) Temp Source: Oral (01/08 0752) BP: 117/63 (01/08 1230) Pulse Rate: 72 (01/08 1230)  Labs: Recent Labs    02/03/21 1412 02/03/21 2119 02/03/21 2119 02/04/21 0130 02/04/21 0415 02/04/21 0620 02/04/21 1039 02/04/21 1103  HGB 12.6*  --   --   --  12.3*  --   --   --   HCT 40.6  --   --   --  38.6*  --   --   --   PLT 258  --   --   --  280  --   --   --   APTT  --  33  --   --   --   --   --   --   LABPROT  --  16.2*  --   --   --   --   --   --   INR  --  1.3*  --   --   --   --   --   --   HEPARINUNFRC  --   --   --   --  0.59  --   --  0.43  CREATININE 1.17  --   --   --  1.33*  --   --   --   TROPONINIHS  --  262*   < > 283*  --  260* 212*  --    < > = values in this interval not displayed.     Estimated Creatinine Clearance: 46.6 mL/min (A) (by C-G formula based on SCr of 1.33 mg/dL (H)).   Medical History: Past Medical History:  Diagnosis Date   Diabetes mellitus without complication ([place])    High cholesterol    Hypertension    Normal nuclear stress test 2010   stress perfusion study apparently in 2010 in [facility] which he said was negative.     Assessment: 19 YOM presenting with cough and SOB, CT angio chest with PE, no RHS.  He is not on anticoagulation PTA, chronic anemia stable, plts wnl  1/8 s/p brief CPR, no acute rib fx on x-ray post, heparin gtt only held briefly during CPR, afib on telemetry, heparin level post remains therapeutic  Goal of Therapy:  Heparin level 0.3-0.7 units/ml Monitor platelets by anticoagulation protocol: Yes   Plan:  Continue heparin gtt at 1200 units/hr Daily heparin level, CBC, s/s bleeding F/u CCM eval and recs, long term [facility] plan  Bertis Ruddy, PharmD Clinical Pharmacist ED Pharmacist Phone # [phone] 02/04/2021 12:52 PM

## 2021-02-04 NOTE — ED Notes (Signed)
Critical troponin lab results communicated to provider

## 2021-02-04 NOTE — Progress Notes (Addendum)
[place] Progress Note Patient Name: DONDRE CATALFAMO DOB: 09-29-1940 MRN: [id number]   Date of Service  02/04/2021  HPI/Events of Note  pt has been brady 57-59 with 1st degree block, having ECG done now but pt is on Amio 30   97/49. CTA: no central PE, segmental, lobar Pe's. On heparin gtt as per RN discussion. V tach. Now brady. EKG stat is pending. Not on vent. Possible metastatic cancer on CT chest.   eICU Interventions  - hold amiodarone for now. Follow EKG. Watch for arythmias.  Follow K/mag/ion calcium and po4 levels, if low replace. Last K was 3.4, mag at 1.4 in noon. Troponin rise is stable at 388. Follow once more.        Intervention Category Intermediate Interventions: Other: (bradycardia)  Elmer Sow 02/04/2021, 11:19 PM  01:51 K/Mag replacement ordered. Cr 1.4 Follow levels in AM. EKG: a fib, has base line artifact. Already on heparin gtt. Cards on board.

## 2021-02-04 NOTE — Progress Notes (Signed)
EKG performed and placed in paper chart. EKG reads Sinus Rhythm with 1st degree AV block, Inferior infarct, age undetermined, abnormal EKG.  61BPM

## 2021-02-04 NOTE — ED Notes (Signed)
1009 Pt ambulated to BR, upon returning began having SOB, chest tightness, and went limp. No pulses.  CPR started by primary RN Meghan.  1010 Epi given per MAR.  1012 Pt began coughing, weak carotid pulse palpated.  1013 Pulses lost, CPR resumed.  1014 Pt began coughing, vomiting. Carotid pulse palpated.

## 2021-02-05 ENCOUNTER — Other Ambulatory Visit (HOSPITAL_COMMUNITY): Payer: Self-pay

## 2021-02-05 DIAGNOSIS — I2609 Other pulmonary embolism with acute cor pulmonale: Secondary | ICD-10-CM | POA: Diagnosis not present

## 2021-02-05 DIAGNOSIS — I469 Cardiac arrest, cause unspecified: Secondary | ICD-10-CM | POA: Diagnosis not present

## 2021-02-05 LAB — CBC
HCT: 36 % — ABNORMAL LOW (ref 39.0–52.0)
Hemoglobin: 11.8 g/dL — ABNORMAL LOW (ref 13.0–17.0)
MCH: 27.1 pg (ref 26.0–34.0)
MCHC: 32.8 g/dL (ref 30.0–36.0)
MCV: 82.6 fL (ref 80.0–100.0)
Platelets: 260 10*3/uL (ref 150–400)
RBC: 4.36 MIL/uL (ref 4.22–5.81)
RDW: 14 % (ref 11.5–15.5)
WBC: 11.7 10*3/uL — ABNORMAL HIGH (ref 4.0–10.5)
nRBC: 0 % (ref 0.0–0.2)

## 2021-02-05 LAB — BASIC METABOLIC PANEL
Anion gap: 9 (ref 5–15)
BUN: 17 mg/dL (ref 8–23)
CO2: 27 mmol/L (ref 22–32)
Calcium: 7.7 mg/dL — ABNORMAL LOW (ref 8.9–10.3)
Chloride: 101 mmol/L (ref 98–111)
Creatinine, Ser: 1.46 mg/dL — ABNORMAL HIGH (ref 0.61–1.24)
GFR, Estimated: 48 mL/min — ABNORMAL LOW (ref >60)
Glucose, Bld: 141 mg/dL — ABNORMAL HIGH (ref 70–99)
Potassium: 3.6 mmol/L (ref 3.5–5.1)
Sodium: 137 mmol/L (ref 135–145)

## 2021-02-05 LAB — GLUCOSE, CAPILLARY
Glucose-Capillary: 119 mg/dL — ABNORMAL HIGH (ref 70–99)
Glucose-Capillary: 128 mg/dL — ABNORMAL HIGH (ref 70–99)
Glucose-Capillary: 139 mg/dL — ABNORMAL HIGH (ref 70–99)
Glucose-Capillary: 168 mg/dL — ABNORMAL HIGH (ref 70–99)
Glucose-Capillary: 186 mg/dL — ABNORMAL HIGH (ref 70–99)
Glucose-Capillary: 197 mg/dL — ABNORMAL HIGH (ref 70–99)

## 2021-02-05 LAB — HEPARIN LEVEL (UNFRACTIONATED): Heparin Unfractionated: 0.52 IU/mL (ref 0.30–0.70)

## 2021-02-05 LAB — PHOSPHORUS: Phosphorus: 4.8 mg/dL — ABNORMAL HIGH (ref 2.5–4.6)

## 2021-02-05 LAB — MAGNESIUM: Magnesium: 1.2 mg/dL — ABNORMAL LOW (ref 1.7–2.4)

## 2021-02-05 LAB — TROPONIN I (HIGH SENSITIVITY): Troponin I (High Sensitivity): 410 ng/L (ref <18)

## 2021-02-05 MED ORDER — ASPIRIN 81 MG PO CHEW
81.0000 mg | CHEWABLE_TABLET | ORAL | Status: AC
  Administered 2021-02-06: 81 mg via ORAL
  Filled 2021-02-05: qty 1

## 2021-02-05 MED ORDER — POTASSIUM CHLORIDE 10 MEQ/100ML IV SOLN
10.0000 meq | INTRAVENOUS | Status: AC
  Administered 2021-02-05 (×3): 10 meq via INTRAVENOUS
  Filled 2021-02-05: qty 100

## 2021-02-05 MED ORDER — MAGNESIUM SULFATE 4 GM/100ML IV SOLN
4.0000 g | Freq: Once | INTRAVENOUS | Status: AC
  Administered 2021-02-05: 4 g via INTRAVENOUS
  Filled 2021-02-05: qty 100

## 2021-02-05 MED ORDER — SODIUM CHLORIDE 0.9% FLUSH
3.0000 mL | Freq: Two times a day (BID) | INTRAVENOUS | Status: DC
  Administered 2021-02-05 – 2021-02-08 (×4): 3 mL via INTRAVENOUS

## 2021-02-05 MED ORDER — BACITRACIN-NEOMYCIN-POLYMYXIN OINTMENT TUBE
TOPICAL_OINTMENT | CUTANEOUS | Status: DC | PRN
  Filled 2021-02-05: qty 14

## 2021-02-05 NOTE — Progress Notes (Signed)
°   02/05/21 1545  Clinical Encounter Type  Visited With Patient and family together  Visit Type Initial;Spiritual support (Advanced Directive)  Referral From Nurse   I visited with patient, we reviewed the requested Advanced directive paperwork. The patient and his family was appreciative of the information. I also provided emotional support to the patient and family.

## 2021-02-05 NOTE — Progress Notes (Signed)
ANTICOAGULATION CONSULT NOTE   Pharmacy Consult for heparin Indication: pulmonary embolus  Allergies  Allergen Reactions   Ace Inhibitors     REACTION: Cough    Patient Measurements: Height: 5\' 11"  (180.3 cm) Weight: 75 kg (165 lb 5.5 oz) IBW/kg (Calculated) : 75.3 Heparin Dosing Weight: TBW  Vital Signs: Temp: 97.5 F (36.4 C) (01/09 0700) Temp Source: Oral (01/09 0700) BP: 119/51 (01/09 0800) Pulse Rate: 62 (01/09 0800)  Labs: Recent Labs    02/03/21 1412 02/03/21 2119 02/04/21 0130 02/04/21 0415 02/04/21 0620 02/04/21 1039 02/04/21 1103 02/04/21 1259 02/05/21 0018  HGB 12.6*  --   --  12.3*  --   --   --   --  11.8*  HCT 40.6  --   --  38.6*  --   --   --   --  36.0*  PLT 258  --   --  280  --   --   --   --  260  APTT  --  33  --   --   --   --   --   --   --   LABPROT  --  16.2*  --   --   --   --   --   --   --   INR  --  1.3*  --   --   --   --   --   --   --   HEPARINUNFRC  --   --   --  0.59  --   --  0.43  --  0.52  CREATININE 1.17  --   --  1.33*  --   --   --   --  1.46*  TROPONINIHS  --  262*   < >  --    < > 212*  --  388* 410*   < > = values in this interval not displayed.     Estimated Creatinine Clearance: 42.8 mL/min (A) (by C-G formula based on SCr of 1.46 mg/dL (H)).   Medical History: Past Medical History:  Diagnosis Date   Diabetes mellitus without complication ([place])    High cholesterol    Hypertension    Normal nuclear stress test 2010   stress perfusion [facility] apparently in 2010 [facility] which he said was negative.     Assessment: 65 YOM presenting with cough and SOB, CT angio chest with PE, no RHS.  He is not on anticoagulation PTA, chronic anemia stable, plts wnl  Heparin level: 0.52 therapeutic. (On Heparin 1200 units/hr) No signs/sx of bleeding.  Plan to Cath this admit  Goal of Therapy:  Heparin level 0.3-0.7 units/ml Monitor platelets by anticoagulation protocol: Yes   Plan:  Continue heparin gtt at  1200 units/hr Daily heparin level, CBC, s/s bleeding F/u CCM eval and recs, long term [facility] plan Consider transitioning to [place], PharmD Candidate 02/05/2021 9:42 AM

## 2021-02-05 NOTE — Progress Notes (Addendum)
Progress Note  Patient Name: Arthur [name] Date of Encounter: 02/05/2021  Primary Cardiologist: Buford Dresser, MD  Subjective   Awake, alert, no CP or SOB. Some chest wall soreness. Daughter also at bedside.  Inpatient Medications    Scheduled Meds:  allopurinol  100 mg Oral Daily   atenolol  50 mg Oral BID   Chlorhexidine Gluconate Cloth  6 each Topical Daily   colchicine  0.6 mg Oral BID   pravastatin  40 mg Oral Daily   Continuous Infusions:  amiodarone Stopped (02/04/21 2320)   heparin 1,200 Units/hr (02/05/21 0827)   PRN Meds: albuterol, loratadine, morphine injection   Vital Signs    Vitals:   02/05/21 0500 02/05/21 0600 02/05/21 0700 02/05/21 0800  BP: (!) 108/56 126/62 (!) 100/48 (!) 119/51  Pulse: 63 72 60 62  Resp: 14 19 15 19   Temp:   (!) 97.5 F (36.4 C)   TempSrc:   Oral   SpO2: 95% 95% 94% 98%  Weight:      Height:        Intake/Output Summary (Last 24 hours) at 02/05/2021 0848 Last data filed at 02/05/2021 0827 Gross per 24 hour  Intake 759.21 ml  Output 400 ml  Net 359.21 ml   Last 3 Weights 02/04/2021 02/01/2021 01/16/2021  Weight (lbs) 165 lb 5.5 oz 163 lb 12.8 oz 166 lb 3.2 oz  Weight (kg) 75 kg 74.299 kg 75.388 kg     Telemetry    SB 50s-NSR 60s with first degree AVB - Personally Reviewed  ECG    NSR 61bpm first degree AVB, nonspecfic TW changes, prior inferior infarct, no acute change from admission - Personally Reviewed  Physical Exam   GEN: No acute distress.  HEENT: Normocephalic, atraumatic, sclera non-icteric. Neck: No JVD or bruits. Cardiac: RRR no murmurs, rubs, or gallops.  Respiratory: Mild end expiratory wheezing otherwise no rales or rhonchi. Breathing is unlabored. GI: Soft, nontender, non-distended, BS +x 4. MS: no deformity. Extremities: No clubbing or cyanosis. No edema. Distal pedal pulses are 2+ and equal bilaterally. Neuro:  AAOx3. Follows commands. Psych:  Responds to questions appropriately with a  normal affect.  Labs    High Sensitivity Troponin:   Recent Labs  Lab 02/04/21 0130 02/04/21 0620 02/04/21 1039 02/04/21 1259 02/05/21 0018  TROPONINIHS 283* 260* 212* 388* 410*      Cardiac EnzymesNo results for input(s): TROPONINI in the last 168 hours. No results for input(s): TROPIPOC in the last 168 hours.   Chemistry Recent Labs  Lab 02/01/21 1100 02/03/21 1412 02/04/21 0415 02/05/21 0018  NA 141 137 135 137  K 4.1 3.9 3.4* 3.6  CL 101 99 95* 101  CO2 26 27 27 27   GLUCOSE 131* 129* 124* 141*  BUN 17 13 13 17   CREATININE 1.20 1.17 1.33* 1.46*  CALCIUM 9.2 9.1 8.9 7.7*  PROT 7.4  --   --   --   ALBUMIN 4.0  --   --   --   AST 17  --   --   --   ALT 10  --   --   --   ALKPHOS 189*  --   --   --   BILITOT 0.7  --   --   --   GFRNONAA  --  >60 54* 48*  ANIONGAP  --  11 13 9      Hematology Recent Labs  Lab 02/03/21 1412 02/04/21 0415 02/05/21 0018  WBC 12.5* 12.7* 11.7*  RBC 4.80 4.66 4.36  HGB 12.6* 12.3* 11.8*  HCT 40.6 38.6* 36.0*  MCV 84.6 82.8 82.6  MCH 26.3 26.4 27.1  MCHC 31.0 31.9 32.8  RDW 13.7 14.0 14.0  PLT 258 280 260    BNP Recent Labs  Lab 02/03/21 1643  BNP 530.8*     DDimer  Recent Labs  Lab 02/03/21 1642  DDIMER >20.00*     Radiology    DG Chest 2 View  Result Date: 02/03/2021 CLINICAL DATA:  Shortness of breath EXAM: CHEST - 2 VIEW COMPARISON:  Chest radiograph Jun 21, 2020 FINDINGS: The heart size and mediastinal contours are within normal limits. Diffuse bilateral prominent interstitial markings concerning for chronic interstitial lung disease or acute bronchitis/bronchiolitis. No definite focal consolidation. Multiple chronic healed left-sided rib fractures. IMPRESSION: Prominent bilateral interstitial markings which may represent chronic interstitial lung disease or acute viral bronchiolitis. No focal consolidation. Clinical correlation is suggested. Electronically Signed   By: Keane Police D.O.   On: 02/03/2021 15:04    CT HEAD WO CONTRAST (5MM)  Result Date: 02/04/2021 CLINICAL DATA:  Altered mental status. Shortness of breath and chest tightness. Status post fall. EXAM: CT HEAD WITHOUT CONTRAST TECHNIQUE: Contiguous axial images were obtained from the base of the skull through the vertex without intravenous contrast. COMPARISON:  None. FINDINGS: Brain: No evidence of acute infarction, hemorrhage, hydrocephalus, extra-axial collection or mass lesion/mass effect. Thickening with scattered calcifications identified along the falx and tentorium. Prominence of sulci and ventricles compatible with brain atrophy. There is mild diffuse low-attenuation within the subcortical and periventricular white matter compatible with chronic microvascular disease. Vascular: No hyperdense vessel or unexpected calcification. Skull: Normal. Negative for fracture or focal lesion. Sinuses/Orbits: No acute finding. Other: None IMPRESSION: 1. No acute intracranial abnormalities. 2. Chronic small vessel ischemic change and brain atrophy. Electronically Signed   By: Kerby Moors M.D.   On: 02/04/2021 13:13   CT Angio Chest PE W and/or Wo Contrast  Addendum Date: 02/03/2021   ADDENDUM REPORT: 02/03/2021 23:07 ADDENDUM: Mild soft tissue pleural thickening at the right posterior lung apex, series 5, image 30 is indeterminate for pleural disease given findings of lung nodules. Electronically Signed   By: Donavan Foil M.D.   On: 02/03/2021 23:07   Result Date: 02/03/2021 CLINICAL DATA:  Shortness of breath EXAM: CT ANGIOGRAPHY CHEST WITH CONTRAST TECHNIQUE: Multidetector CT imaging of the chest was performed using the standard protocol during bolus administration of intravenous contrast. Multiplanar CT image reconstructions and MIPs were obtained to evaluate the vascular anatomy. CONTRAST:  63mL OMNIPAQUE IOHEXOL 350 MG/ML SOLN COMPARISON:  Chest x-ray 02/03/2021, CT chest 05/18/2015 FINDINGS: Cardiovascular: Satisfactory opacification of the  pulmonary arteries to the segmental level. Acute thrombus within right middle lobe subsegmental vessels. Thrombus also present within right upper lobe segmental and subsegmental vessels. Small amount of peripheral thrombus within the distal right pulmonary artery. No evidence for right heart strain. Mild aortic atherosclerosis. No aneurysm. Coronary vascular calcification. Borderline to mild cardiomegaly. No pericardial effusion. Mediastinum/Nodes: Midline trachea. No thyroid mass. Enlarged right paratracheal lymph node measuring 13 mm. AP window lymph node measuring 18 mm. Right hilar node measuring 13 mm. Subcarinal lymph node measuring 14 mm. Esophagus within normal limits. Lungs/Pleura: Emphysema. Interim development of multiple pulmonary nodules. 8 mm right upper lobe pulmonary nodule, series 6 image 49. 18 mm right lower lobe lung nodule, series 6, image 75. 8 mm left lower lobe pulmonary nodule, series 6, image 116. Upper Abdomen: No acute abnormality. 15  mm right adrenal nodule, unchanged and felt consistent with adenoma. Small lymph nodes adjacent to the aorta, at the porta hepatis and in the gastrohepatic region. Cyst upper pole right kidney. Small right kidney stone Musculoskeletal: No acute or suspicious osseous abnormality.Multiple remote left rib fractures. Review of the MIP images confirms the above findings. IMPRESSION: 1. Positive for acute pulmonary emboli within the distal right pulmonary artery and segmental and subsegmental right upper and middle lobe pulmonary vessels. Negative for right heart strain. 2. Interim development of multiple pulmonary nodules measuring up to 18 mm in the right lower lobe suspicious for metastatic disease. Mediastinal and hilar adenopathy, also suspect for metastatic disease. Critical Value/emergent results were called by telephone at the time of interpretation on 02/03/2021 at 7:25 pm to provider [facility] RAY , who verbally acknowledged these results. Aortic  Atherosclerosis (ICD10-I70.0) and Emphysema (ICD10-J43.9). Electronically Signed: By: Donavan Foil M.D. On: 02/03/2021 19:26   DG Chest Portable 1 View  Addendum Date: 02/04/2021   ADDENDUM REPORT: 02/04/2021 11:15 ADDENDUM: There are old, healed posterior left fourth, fifth and sixth rib fractures. No convincing acute rib fractures. Electronically Signed   By: Lajean Manes M.D.   On: 02/04/2021 11:15   Result Date: 02/04/2021 CLINICAL DATA:  Post CPR. EXAM: PORTABLE CHEST 1 VIEW COMPARISON:  02/03/2021 and older studies. FINDINGS: Cardiac silhouette is normal in size. No mediastinal or hilar masses. Prominent bronchovascular/interstitial markings, stable. No lung consolidation or edema. No convincing pleural effusion and no pneumothorax. Right lower lobe nodule noted on the previous day's CT is not defined. Skeletal structures are grossly intact. IMPRESSION: No acute cardiopulmonary disease. Electronically Signed: By: Lajean Manes M.D. On: 02/04/2021 10:46   ECHOCARDIOGRAM COMPLETE  Result Date: 02/04/2021    ECHOCARDIOGRAM REPORT   Patient Name:   Arthur [name] [facility] Date of Exam: 02/04/2021 Medical Rec #:  [id number]         Height:       71.0 in Accession #:    [id number]        Weight:       163.8 lb Date of Birth:  April 30, 1940          BSA:          1.937 m Patient Age:    37 years          BP:           117/63 mmHg Patient Gender: M                 HR:           70 bpm. Exam Location:  Inpatient Procedure: 2D Echo, Cardiac Doppler and Color Doppler STAT ECHO Indications:    Pulmonary Embolus  History:        Patient has prior history of Echocardiogram examinations, most                 recent 05/18/2015. Risk Factors:Diabetes and Hypertension.  Sonographer:    Glo Herring Referring Phys: [id number] [place]  1. Left ventricular ejection fraction, by estimation, is 50 to 55%. The left ventricle has low normal function. The left ventricle demonstrates regional wall motion abnormalities (see  scoring diagram/findings for description). There is moderate asymmetric left ventricular hypertrophy of the basal-septal segment. Left ventricular diastolic parameters are consistent with Grade II diastolic dysfunction (pseudonormalization). There is the interventricular septum is flattened in systole, consistent with right ventricular pressure overload.  2. Right ventricular systolic function is mildly reduced. The right  ventricular size is normal. There is severely elevated pulmonary artery systolic pressure. The estimated right ventricular systolic pressure is 17.7 mmHg.  3. Right atrial size was mildly dilated.  4. There is no evidence of cardiac tamponade.  5. The mitral valve is grossly normal. Mild mitral valve regurgitation. No evidence of mitral stenosis.  6. The aortic valve was not well visualized. There is moderate calcification of the aortic valve. There is moderate thickening of the aortic valve. Aortic valve regurgitation is not visualized. Mild aortic valve stenosis.  7. The inferior vena cava is normal in size with <50% respiratory variability, suggesting right atrial pressure of 8 mmHg. Comparison(s): Changes from prior study are noted. Conclusion(s)/Recommendation(s): RV with mildly reduced function and normal size. Severely elevated RVSP of 66 mmHg. There is a focal wall motion abnormality in the basal to mid septum and the basal inferior wall. Overall EF is low normal at 50-55%. FINDINGS  Left Ventricle: Left ventricular ejection fraction, by estimation, is 50 to 55%. The left ventricle has low normal function. The left ventricle demonstrates regional wall motion abnormalities. The left ventricular internal cavity size was normal in size. There is moderate asymmetric left ventricular hypertrophy of the basal-septal segment. The interventricular septum is flattened in systole, consistent with right ventricular pressure overload. Left ventricular diastolic parameters are consistent with Grade II  diastolic dysfunction (pseudonormalization).  LV Wall Scoring: The anterior septum, mid inferoseptal segment, basal inferior segment, and basal inferoseptal segment are hypokinetic. The entire anterior wall, entire lateral wall, entire apex, and mid and distal inferior wall are normal. Right Ventricle: The right ventricular size is normal. Right vetricular wall thickness was not well visualized. Right ventricular systolic function is mildly reduced. There is severely elevated pulmonary artery systolic pressure. The tricuspid regurgitant velocity is 3.82 m/s, and with an assumed right atrial pressure of 8 mmHg, the estimated right ventricular systolic pressure is 93.9 mmHg. Left Atrium: Left atrial size was normal in size. Right Atrium: Right atrial size was mildly dilated. Pericardium: Trivial pericardial effusion is present. There is no evidence of cardiac tamponade. Mitral Valve: The mitral valve is grossly normal. Mild mitral valve regurgitation. No evidence of mitral valve stenosis. Tricuspid Valve: The tricuspid valve is normal in structure. Tricuspid valve regurgitation is mild . No evidence of tricuspid stenosis. Aortic Valve: The aortic valve was not well visualized. There is moderate calcification of the aortic valve. There is moderate thickening of the aortic valve. Aortic valve regurgitation is not visualized. Mild aortic stenosis is present. Aortic valve mean gradient measures 6.0 mmHg. Aortic valve peak gradient measures 11.6 mmHg. Aortic valve area, by VTI measures 1.92 cm. Pulmonic Valve: The pulmonic valve was grossly normal. Pulmonic valve regurgitation is trivial. No evidence of pulmonic stenosis. Aorta: The aortic root, ascending aorta and aortic arch are all structurally normal, with no evidence of dilitation or obstruction. Venous: The inferior vena cava is normal in size with less than 50% respiratory variability, suggesting right atrial pressure of 8 mmHg. IAS/Shunts: The atrial septum is  grossly normal.  LEFT VENTRICLE PLAX 2D LVOT diam:     2.00 cm   Diastology LV SV:         73        LV e' medial:    4.95 cm/s LV SV Index:   37        LV E/e' medial:  18.6 LVOT Area:     3.14 cm  LV e' lateral:   6.30 cm/s  LV E/e' lateral: 14.6  RIGHT VENTRICLE            IVC RV Basal diam:  3.60 cm    IVC diam: 1.80 cm RV Mid diam:    2.30 cm RV S prime:     8.76 cm/s LEFT ATRIUM           Index        RIGHT ATRIUM           Index LA Vol (A4C): 39.8 ml 20.55 ml/m  RA Area:     19.80 cm                                    RA Volume:   56.80 ml  29.32 ml/m  AORTIC VALVE                     PULMONIC VALVE AV Area (Vmax):    2.07 cm      PV Vmax:       0.76 m/s AV Area (Vmean):   1.90 cm      PV Peak grad:  2.3 mmHg AV Area (VTI):     1.92 cm AV Vmax:           170.00 cm/s AV Vmean:          117.000 cm/s AV VTI:            0.377 m AV Peak Grad:      11.6 mmHg AV Mean Grad:      6.0 mmHg LVOT Vmax:         112.00 cm/s LVOT Vmean:        70.700 cm/s LVOT VTI:          0.231 m LVOT/AV VTI ratio: 0.61  AORTA Ao Root diam: 3.20 cm Ao Asc diam:  3.30 cm MITRAL VALVE               TRICUSPID VALVE MV Area (PHT): 4.21 cm    TR Peak grad:   58.4 mmHg MV Decel Time: 180 msec    TR Vmax:        382.00 cm/s MV E velocity: 92.10 cm/s MV A velocity: 68.10 cm/s  SHUNTS MV E/A ratio:  1.35        Systemic VTI:  0.23 m                            Systemic Diam: 2.00 cm Buford Dresser MD Electronically signed by Buford Dresser MD Signature Date/Time: 02/04/2021/3:33:24 PM    Final     Cardiac Studies   2D echo 02/04/21    1. Left ventricular ejection fraction, by estimation, is 50 to 55%. The  left ventricle has low normal function. The left ventricle demonstrates  regional wall motion abnormalities (see scoring diagram/findings for  description). There is moderate  asymmetric left ventricular hypertrophy of the basal-septal segment. Left  ventricular diastolic parameters are  consistent with Grade II diastolic  dysfunction (pseudonormalization). There is the interventricular septum is  flattened in systole, consistent  with right ventricular pressure overload.   2. Right ventricular systolic function is mildly reduced. The right  ventricular size is normal. There is severely elevated pulmonary artery  systolic pressure. The estimated right ventricular systolic pressure is  93.9 mmHg.   3. Right atrial size was mildly dilated.  4. There is no evidence of cardiac tamponade.   5. The mitral valve is grossly normal. Mild mitral valve regurgitation.  No evidence of mitral stenosis.   6. The aortic valve was not well visualized. There is moderate  calcification of the aortic valve. There is moderate thickening of the  aortic valve. Aortic valve regurgitation is not visualized. Mild aortic  valve stenosis.   7. The inferior vena cava is normal in size with <50% respiratory  variability, suggesting right atrial pressure of 8 mmHg.   Comparison(s): Changes from prior study are noted.   Conclusion(s)/Recommendation(s): RV with mildly reduced function and  normal size. Severely elevated RVSP of 66 mmHg. There is a focal wall  motion abnormality in the basal to mid septum and the basal inferior wall.  Overall EF is low normal at 50-55%.   Patient Profile     81 y.o. male with hx of DVT/PE 2017, pulmonary nodules, HTN, DM, HLD, GERD who presented 1/7 with worsening dyspnea on exertion. Labs showed mildly elevated flat troponin in the 200s. CT angio showed  with PE within the distal right pulmonary artery, segmental and subsegmental right upper and middle lobe, negative for right heart strain, increased RLL nodule 58mm concerning for metastatic disease. While awaiting admission in the ED, got up to use the bathroom then upon return complained of SOB/chest tightness and became unresponsive/pulseless. Received CPR, epi and obtained ROSC after 5 minutes. EKG done just prior  to arrest showed WCT concerning for VT. Once regained pulse, he was awake and protecting his airway. Started on IV amiodarone with bolus. Cardiology called for further evaluation.   Assessment & Plan    1. Recurrent DVT/PE - anticoag per primary team/PCCM -> per notes they have considered full dose lytics but family wanted to avoid and would prefer treatment with just heparin; plan to reconsider if he deteriorates - anticipate lifelong [place] at DC  2. Cardiac arrest with VT -> PEA -> asystole - unclear if this is related to primary problem or secondary issue I.e. related to coronary ischemia - last troponin was 410 - per Dr. Judeth Cornfield notes, would consider coronary angiography this admission but did not have to be urgently - family had requested Dr. Burt Knack - remains on atenolol, pravastatin -> consider switching atenolol to more selective BB given mild wheezing - IV amiodarone stopped overnight due to sinus bradycardia 50s - I will discuss further recs/cath plan with Dr. Johnsie Cancel - will defer rx of colchicine to primary team - multiple drug interactions with this medicine so would limit use if able  3. Pulmonary HTN - PA pressures very elevated in severe rate and RVF is mildly reduced- this plus VT is concerning - per prior recs, recommend rechecking echo as outpatient after being on anticoagulation to see if pressures improved - also needs w/u of lung malignancy - if not, needs consideration of RHC + pulm HTN w/u contingent on medical trajectory  4. Pulmonary nodules - CT on admission showed interim development of multiple pulmonary nodules measuring up to 18 mm in the right lower lobe suspicious for metastatic disease. Mediastinal and hilar adenopathy, also suspect for metastatic disease - further management per primary teams - notes indicate plan for workup as outpatient once stabilized  5. Essential HTN - BP soft at times overnight, hold amlodipine - otherwise manage in context of  above  6. Hyperlipidemia - maintained on pravastatin 40mg  daily - check lipids in AM  7. Hypokalemia/hypomagnsemia/hypocalcemia - primary team managing electrolytes -  given VT hx, keep K 4.0 or greater and Mg 2.0 or greater  8. 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  Signed, Charlie Pitter, PA-C 02/05/2021, 8:48 AM    Patient examined chart reviewed. Exam with elderly [place] male. Lungs clear no murmur abdomen soft Amiodarone held yesterday due to bradycardia. In NSR this am Patient and daughter aware of likely lung cancer diagnosis. Discussed cath given PEA arrest. Have arranged for am with Dr Irish Lack. 7:30 case Can hold heparin on call to cath lab and resume ASAP after TR band removed post case. Risks including stroke, MI, intubation and bleeding discussed Willing to proceed Discussed with Dr Irish Lack as well   Jenkins Rouge MD [facility]

## 2021-02-05 NOTE — TOC Benefit Eligibility Note (Signed)
Patient Advocate Encounter ° °Insurance verification completed.   ° °The patient is currently admitted and upon discharge could be taking Eliquis 5 mg. ° °The current 30 day co-pay is, $0.00.  ° °The patient is insured through [employer] Medicare Part D  ° ° ° °Tyeisha Dinan, CPhT °Pharmacy Patient Advocate Specialist °[facility] Patient Advocate Team °Direct Number: [phone]  Fax: [phone] ° ° ° ° ° °  °

## 2021-02-05 NOTE — Progress Notes (Signed)
MD made aware of stat lab values per E-Link at this time.

## 2021-02-05 NOTE — Progress Notes (Addendum)
NAME:  Arthur [name], MRN:  [id number], DOB:  11-Feb-1940, LOS: 1 ADMISSION DATE:  02/03/2021, CONSULTATION DATE:  1/8 REFERRING MD:  Andria Frames, CHIEF COMPLAINT:  Cardiac arrest   History of Present Illness:  81 y/o male admitted 1/7 with dyspnea, found to have a PE.  Initially treated with heparin, then had a cardiac arrest on the floor after he got up to go to the bathroom.  He had an uprovoked PE in 2017.    Pertinent  Medical History  DM2 Hyperlipidemia Hypertension History of PE in 2017  Significant Hospital Events: Including procedures, antibiotic start and stop dates in addition to other pertinent events   1/7 admission 1/8 brief cardiac arrest  Imaging: 1/7 CT angiogram chest > acute PE right upper and right middle lobes, both segmental PE, no RV strain; 21mm RLL nodule 1/8 TTE LVEF 50-55%, RVSP 66 mmHg, severely elevated PA pressure, RV systolic function mildly reduced, RA mild dilated   Interim History / Subjective:  Feels OK  For heart cath tmorrow  Objective   Blood pressure 126/62, pulse 72, temperature (!) 97.5 F (36.4 C), temperature source Oral, resp. rate 19, height 5\' 11"  (1.803 m), weight 75 kg, SpO2 95 %.        Intake/Output Summary (Last 24 hours) at 02/05/2021 0815 Last data filed at 02/05/2021 0600 Gross per 24 hour  Intake 729.66 ml  Output 400 ml  Net 329.66 ml   Filed Weights   02/04/21 1642  Weight: 75 kg    Examination:  General:  Resting comfortably in bed HENT: NCAT OP clear PULM: CTA B, normal effort CV: RRR, no mgr GI: BS+, soft, nontender MSK: normal bulk and tone Neuro: awake, alert, no distress, MAEW   Resolved Hospital Problem list     Assessment & Plan:  Syncope after pulmonary embolism, had VT> unclear if this was due to pulmonary hypertension/PE or not Tele Left heart cath tomorrow F/u cardiology recommendations If LHC normal, would consider repeat CT angiogram chest to see if clot burden increased after admission  study (ie. Did he have DVT embolize as cause of syncope?) because the clot burden in the 1/7 images is not very impressive  PE, 2nd lifetime Heparin per pharmacy, can go to [place] [facility] after cath Will need lifelong anticoagulation  Pulmonary nodules Needs PET CT as outpatient  Move to floor, TRH service   Best Practice (right click and "Reselect all SmartList Selections" daily)   Diet/type: Regular consistency (see orders) DVT prophylaxis: systemic heparin GI prophylaxis: N/A Lines: N/A Foley:  N/A Code Status:  full code Last date of multidisciplinary goals of care discussion [1/8]  Labs   CBC: Recent Labs  Lab 02/03/21 1412 02/04/21 0415 02/05/21 0018  WBC 12.5* 12.7* 11.7*  NEUTROABS  --  7.6  --   HGB 12.6* 12.3* 11.8*  HCT 40.6 38.6* 36.0*  MCV 84.6 82.8 82.6  PLT 258 280 287    Basic Metabolic Panel: Recent Labs  Lab 02/01/21 1100 02/03/21 1412 02/04/21 0415 02/04/21 1400 02/05/21 0018  NA 141 137 135  --  137  K 4.1 3.9 3.4*  --  3.6  CL 101 99 95*  --  101  CO2 26 27 27   --  27  GLUCOSE 131* 129* 124*  --  141*  BUN 17 13 13   --  17  CREATININE 1.20 1.17 1.33*  --  1.46*  CALCIUM 9.2 9.1 8.9  --  7.7*  MG  --   --   --  1.3* 1.2*  PHOS  --   --   --   --  4.8*   GFR: Estimated Creatinine Clearance: 42.8 mL/min (A) (by C-G formula based on SCr of 1.46 mg/dL (H)). Recent Labs  Lab 02/03/21 1412 02/04/21 0415 02/05/21 0018  WBC 12.5* 12.7* 11.7*    Liver Function Tests: Recent Labs  Lab 02/01/21 1100  AST 17  ALT 10  ALKPHOS 189*  BILITOT 0.7  PROT 7.4  ALBUMIN 4.0   Recent Labs  Lab 02/01/21 1100  LIPASE 24   No results for input(s): AMMONIA in the last 168 hours.  ABG No results found for: PHART, PCO2ART, PO2ART, HCO3, TCO2, ACIDBASEDEF, O2SAT   Coagulation Profile: Recent Labs  Lab 02/03/21 2119  INR 1.3*    Cardiac Enzymes: No results for input(s): CKTOTAL, CKMB, CKMBINDEX, TROPONINI in the last 168  hours.  HbA1C: HbA1c, POC (controlled diabetic range)  Date/Time Value Ref Range Status  01/16/2021 08:59 AM 6.3 0.0 - 7.0 % Final  10/18/2020 08:50 AM 6.4 0.0 - 7.0 % Final    CBG: Recent Labs  Lab 02/04/21 1436 02/04/21 1918 02/04/21 2335 02/05/21 0319 02/05/21 0715  GLUCAP 156* 165* 183* 119* 128*   Critical care time: n/a    Roselie Awkward, MD [facility] Pager: [phone] Cell: [phone] After 7:00 pm call Elink  [phone]

## 2021-02-06 ENCOUNTER — Encounter (HOSPITAL_COMMUNITY)
Admission: EM | Disposition: A | Discharge status: Home Health | Payer: Self-pay | Source: Home / Self Care | Attending: Family Medicine

## 2021-02-06 ENCOUNTER — Encounter (HOSPITAL_COMMUNITY): Payer: Self-pay | Admitting: Interventional Cardiology

## 2021-02-06 DIAGNOSIS — I2699 Other pulmonary embolism without acute cor pulmonale: Secondary | ICD-10-CM | POA: Diagnosis not present

## 2021-02-06 DIAGNOSIS — I469 Cardiac arrest, cause unspecified: Secondary | ICD-10-CM

## 2021-02-06 DIAGNOSIS — E782 Mixed hyperlipidemia: Secondary | ICD-10-CM | POA: Diagnosis not present

## 2021-02-06 DIAGNOSIS — I2510 Atherosclerotic heart disease of native coronary artery without angina pectoris: Secondary | ICD-10-CM | POA: Diagnosis not present

## 2021-02-06 DIAGNOSIS — I10 Essential (primary) hypertension: Secondary | ICD-10-CM | POA: Diagnosis not present

## 2021-02-06 DIAGNOSIS — E1169 Type 2 diabetes mellitus with other specified complication: Secondary | ICD-10-CM | POA: Diagnosis not present

## 2021-02-06 HISTORY — PX: CORONARY STENT INTERVENTION: CATH118234

## 2021-02-06 HISTORY — PX: LEFT HEART CATH AND CORONARY ANGIOGRAPHY: CATH118249

## 2021-02-06 HISTORY — PX: INTRAVASCULAR ULTRASOUND/IVUS: CATH118244

## 2021-02-06 LAB — COMPREHENSIVE METABOLIC PANEL
ALT: 28 U/L (ref 0–44)
AST: 28 U/L (ref 15–41)
Albumin: 2.6 g/dL — ABNORMAL LOW (ref 3.5–5.0)
Alkaline Phosphatase: 137 U/L — ABNORMAL HIGH (ref 38–126)
Anion gap: 10 (ref 5–15)
BUN: 25 mg/dL — ABNORMAL HIGH (ref 8–23)
CO2: 24 mmol/L (ref 22–32)
Calcium: 8.1 mg/dL — ABNORMAL LOW (ref 8.9–10.3)
Chloride: 98 mmol/L (ref 98–111)
Creatinine, Ser: 1.52 mg/dL — ABNORMAL HIGH (ref 0.61–1.24)
GFR, Estimated: 46 mL/min — ABNORMAL LOW (ref >60)
Glucose, Bld: 179 mg/dL — ABNORMAL HIGH (ref 70–99)
Potassium: 3.9 mmol/L (ref 3.5–5.1)
Sodium: 132 mmol/L — ABNORMAL LOW (ref 135–145)
Total Bilirubin: 0.8 mg/dL (ref 0.3–1.2)
Total Protein: 6.2 g/dL — ABNORMAL LOW (ref 6.5–8.1)

## 2021-02-06 LAB — POCT ACTIVATED CLOTTING TIME
Activated Clotting Time: 305 seconds
Activated Clotting Time: 287 seconds
Activated Clotting Time: 389 seconds

## 2021-02-06 LAB — CALCIUM, IONIZED: Calcium, Ionized, Serum: 4.4 mg/dL — ABNORMAL LOW (ref 4.5–5.6)

## 2021-02-06 LAB — GLUCOSE, CAPILLARY
Glucose-Capillary: 139 mg/dL — ABNORMAL HIGH (ref 70–99)
Glucose-Capillary: 152 mg/dL — ABNORMAL HIGH (ref 70–99)
Glucose-Capillary: 179 mg/dL — ABNORMAL HIGH (ref 70–99)

## 2021-02-06 LAB — CBC
HCT: 35.3 % — ABNORMAL LOW (ref 39.0–52.0)
Hemoglobin: 11.2 g/dL — ABNORMAL LOW (ref 13.0–17.0)
MCH: 26.5 pg (ref 26.0–34.0)
MCHC: 31.7 g/dL (ref 30.0–36.0)
MCV: 83.6 fL (ref 80.0–100.0)
Platelets: 271 10*3/uL (ref 150–400)
RBC: 4.22 MIL/uL (ref 4.22–5.81)
RDW: 13.9 % (ref 11.5–15.5)
WBC: 11.7 10*3/uL — ABNORMAL HIGH (ref 4.0–10.5)
nRBC: 0 % (ref 0.0–0.2)

## 2021-02-06 LAB — LIPID PANEL
Cholesterol: 133 mg/dL (ref 0–200)
HDL: 43 mg/dL (ref >40)
LDL Cholesterol: 71 mg/dL (ref 0–99)
Total CHOL/HDL Ratio: 3.1 RATIO
Triglycerides: 97 mg/dL (ref <150)
VLDL: 19 mg/dL (ref 0–40)

## 2021-02-06 LAB — MAGNESIUM: Magnesium: 2.2 mg/dL (ref 1.7–2.4)

## 2021-02-06 LAB — HEPARIN LEVEL (UNFRACTIONATED): Heparin Unfractionated: 0.37 IU/mL (ref 0.30–0.70)

## 2021-02-06 SURGERY — LEFT HEART CATH AND CORONARY ANGIOGRAPHY
Anesthesia: LOCAL

## 2021-02-06 MED ORDER — VERAPAMIL HCL 2.5 MG/ML IV SOLN
INTRAVENOUS | Status: DC | PRN
  Administered 2021-02-06 (×2): 10 mL via INTRA_ARTERIAL

## 2021-02-06 MED ORDER — SODIUM CHLORIDE 0.9% FLUSH: 3.0000 mL | INTRAVENOUS | Status: DC | PRN

## 2021-02-06 MED ORDER — CLOPIDOGREL BISULFATE 300 MG PO TABS
ORAL_TABLET | ORAL | Status: AC
  Filled 2021-02-06: qty 2

## 2021-02-06 MED ORDER — LIDOCAINE HCL (PF) 1 % IJ SOLN
INTRAMUSCULAR | Status: AC
  Filled 2021-02-06: qty 30

## 2021-02-06 MED ORDER — HEPARIN (PORCINE) IN NACL 1000-0.9 UT/500ML-% IV SOLN
INTRAVENOUS | Status: AC
  Filled 2021-02-06: qty 1000

## 2021-02-06 MED ORDER — SODIUM CHLORIDE 0.9 % WEIGHT BASED INFUSION
3.0000 mL/kg/h | INTRAVENOUS | Status: AC
  Administered 2021-02-06: 3 mL/kg/h via INTRAVENOUS

## 2021-02-06 MED ORDER — NITROGLYCERIN 1 MG/10 ML FOR IR/CATH LAB
INTRA_ARTERIAL | Status: DC | PRN
  Administered 2021-02-06: 200 ug
  Administered 2021-02-06: 500 ug

## 2021-02-06 MED ORDER — CLOPIDOGREL BISULFATE 75 MG PO TABS
75.0000 mg | ORAL_TABLET | Freq: Every day | ORAL | Status: DC
  Administered 2021-02-07 – 2021-02-08 (×2): 75 mg via ORAL
  Filled 2021-02-06 (×2): qty 1

## 2021-02-06 MED ORDER — INSULIN ASPART 100 UNIT/ML IJ SOLN: 0.0000 [IU] | Freq: Three times a day (TID) | INTRAMUSCULAR | Status: DC

## 2021-02-06 MED ORDER — ACETAMINOPHEN 325 MG PO TABS
650.0000 mg | ORAL_TABLET | ORAL | Status: DC | PRN
  Administered 2021-02-07: 650 mg via ORAL
  Filled 2021-02-06: qty 2

## 2021-02-06 MED ORDER — LIDOCAINE HCL (PF) 1 % IJ SOLN
INTRAMUSCULAR | Status: DC | PRN
  Administered 2021-02-06: 2 mL

## 2021-02-06 MED ORDER — HEPARIN (PORCINE) IN NACL 1000-0.9 UT/500ML-% IV SOLN
INTRAVENOUS | Status: DC | PRN
  Administered 2021-02-06 (×2): 500 mL

## 2021-02-06 MED ORDER — SODIUM CHLORIDE 0.9% FLUSH
3.0000 mL | Freq: Two times a day (BID) | INTRAVENOUS | Status: DC
  Administered 2021-02-06 – 2021-02-07 (×2): 3 mL via INTRAVENOUS

## 2021-02-06 MED ORDER — HEPARIN SODIUM (PORCINE) 1000 UNIT/ML IJ SOLN
INTRAMUSCULAR | Status: DC | PRN
  Administered 2021-02-06: 2000 [IU] via INTRAVENOUS
  Administered 2021-02-06: 3500 [IU] via INTRAVENOUS
  Administered 2021-02-06: 5000 [IU] via INTRAVENOUS

## 2021-02-06 MED ORDER — MIDAZOLAM HCL 2 MG/2ML IJ SOLN
INTRAMUSCULAR | Status: DC | PRN
  Administered 2021-02-06 (×2): 1 mg via INTRAVENOUS

## 2021-02-06 MED ORDER — HEPARIN (PORCINE) 25000 UT/250ML-% IV SOLN
1200.0000 [IU]/h | INTRAVENOUS | Status: AC
  Administered 2021-02-06 – 2021-02-07 (×2): 1200 [IU]/h via INTRAVENOUS
  Filled 2021-02-06 (×2): qty 250

## 2021-02-06 MED ORDER — SODIUM CHLORIDE 0.9 % IV SOLN: INTRAVENOUS | Status: AC

## 2021-02-06 MED ORDER — HYDRALAZINE HCL 20 MG/ML IJ SOLN: 10.0000 mg | INTRAMUSCULAR | Status: AC | PRN

## 2021-02-06 MED ORDER — SODIUM CHLORIDE 0.9 % WEIGHT BASED INFUSION
1.0000 mL/kg/h | INTRAVENOUS | Status: DC
  Administered 2021-02-06: 1 mL/kg/h via INTRAVENOUS

## 2021-02-06 MED ORDER — FENTANYL CITRATE (PF) 100 MCG/2ML IJ SOLN
INTRAMUSCULAR | Status: DC | PRN
  Administered 2021-02-06: 25 ug via INTRAVENOUS

## 2021-02-06 MED ORDER — SODIUM CHLORIDE 0.9 % IV SOLN: 250.0000 mL | INTRAVENOUS | Status: DC | PRN

## 2021-02-06 MED ORDER — SODIUM CHLORIDE 0.9 % WEIGHT BASED INFUSION: 3.0000 mL/kg/h | INTRAVENOUS | Status: DC

## 2021-02-06 MED ORDER — ASPIRIN 81 MG PO CHEW
81.0000 mg | CHEWABLE_TABLET | Freq: Every day | ORAL | Status: DC
  Administered 2021-02-07: 81 mg via ORAL
  Filled 2021-02-06: qty 1

## 2021-02-06 MED ORDER — ONDANSETRON HCL 4 MG/2ML IJ SOLN: 4.0000 mg | Freq: Four times a day (QID) | INTRAMUSCULAR | Status: DC | PRN

## 2021-02-06 MED ORDER — CLOPIDOGREL BISULFATE 300 MG PO TABS
ORAL_TABLET | ORAL | Status: DC | PRN
  Administered 2021-02-06: 600 mg via ORAL

## 2021-02-06 MED ORDER — MIDAZOLAM HCL 2 MG/2ML IJ SOLN
INTRAMUSCULAR | Status: AC
  Filled 2021-02-06: qty 2

## 2021-02-06 MED ORDER — ROSUVASTATIN CALCIUM 20 MG PO TABS
20.0000 mg | ORAL_TABLET | Freq: Every day | ORAL | Status: DC
  Administered 2021-02-07 – 2021-02-08 (×2): 20 mg via ORAL
  Filled 2021-02-06 (×2): qty 1

## 2021-02-06 MED ORDER — NITROGLYCERIN 1 MG/10 ML FOR IR/CATH LAB
INTRA_ARTERIAL | Status: AC
  Filled 2021-02-06: qty 10

## 2021-02-06 MED ORDER — IOHEXOL 350 MG/ML SOLN
INTRAVENOUS | Status: DC | PRN
  Administered 2021-02-06: 155 mL

## 2021-02-06 MED ORDER — HEPARIN SODIUM (PORCINE) 1000 UNIT/ML IJ SOLN
INTRAMUSCULAR | Status: AC
  Filled 2021-02-06: qty 10

## 2021-02-06 MED ORDER — SODIUM CHLORIDE 0.9 % WEIGHT BASED INFUSION: 1.0000 mL/kg/h | INTRAVENOUS | Status: DC

## 2021-02-06 MED ORDER — VERAPAMIL HCL 2.5 MG/ML IV SOLN
INTRAVENOUS | Status: AC
  Filled 2021-02-06: qty 2

## 2021-02-06 MED ORDER — FENTANYL CITRATE (PF) 100 MCG/2ML IJ SOLN
INTRAMUSCULAR | Status: AC
  Filled 2021-02-06: qty 2

## 2021-02-06 MED ORDER — LABETALOL HCL 5 MG/ML IV SOLN: 10.0000 mg | INTRAVENOUS | Status: AC | PRN

## 2021-02-06 SURGICAL SUPPLY — 25 items
BALLN SAPPHIRE 2.0X12 (BALLOONS) ×2
BALLN SAPPHIRE ~~LOC~~ 4.0X10 (BALLOONS) ×1 IMPLANT
BALLN SCOREFLEX 2.75X10 (BALLOONS) ×2
BALLOON SAPPHIRE 2.0X12 (BALLOONS) IMPLANT
BALLOON SCOREFLEX 2.75X10 (BALLOONS) IMPLANT
CATH INFINITI 5 FR JL3.5 (CATHETERS) ×1 IMPLANT
CATH INFINITI JR4 5F (CATHETERS) ×1 IMPLANT
CATH LAUNCHER 6FR EBU 3 (CATHETERS) ×1 IMPLANT
CATH LAUNCHER 6FR EBU3.5 (CATHETERS) ×1 IMPLANT
CATH OPTICROSS HD (CATHETERS) ×1 IMPLANT
DEVICE RAD COMP TR BAND LRG (VASCULAR PRODUCTS) ×1 IMPLANT
GLIDESHEATH SLEND SS 6F .021 (SHEATH) ×1 IMPLANT
GUIDEWIRE INQWIRE 1.5J.035X260 (WIRE) IMPLANT
INQWIRE 1.5J .035X260CM (WIRE) ×2
KIT ENCORE 26 ADVANTAGE (KITS) ×1 IMPLANT
KIT HEART LEFT (KITS) ×2 IMPLANT
PACK CARDIAC CATHETERIZATION (CUSTOM PROCEDURE TRAY) ×2 IMPLANT
SHEATH PROBE COVER 6X72 (BAG) ×1 IMPLANT
SLED PULL BACK IVUS (MISCELLANEOUS) ×1 IMPLANT
STENT ONYX FRONTIER 3.5X15 (Permanent Stent) ×1 IMPLANT
TRANSDUCER W/STOPCOCK (MISCELLANEOUS) ×2 IMPLANT
TUBING CIL FLEX 10 FLL-RA (TUBING) ×2 IMPLANT
VALVE COPILOT STAT (MISCELLANEOUS) ×1 IMPLANT
WIRE ASAHI PROWATER 180CM (WIRE) ×1 IMPLANT
WIRE HI TORQ BMW 190CM (WIRE) ×1 IMPLANT

## 2021-02-06 NOTE — Progress Notes (Signed)
[place] assisted patient with completion of Advance Directive in presence of his wife and two-sons. Scheduled Notary and witnesses for Wednesday, 02/07/2021 at 1330 in patient room.

## 2021-02-06 NOTE — Interval H&P Note (Signed)
Cath Lab Visit (complete for each Cath Lab visit)  Clinical Evaluation Leading to the Procedure:   ACS: Yes.    Non-ACS:    Anginal Classification: CCS IV  Anti-ischemic medical therapy: Minimal Therapy (1 class of medications)  Non-Invasive Test Results: No non-invasive testing performed  Prior CABG: No previous CABG      History and Physical Interval Note:  02/06/2021 7:34 AM  Arthur [name]  has presented today for surgery, with the diagnosis of chest pain.  The various methods of treatment have been discussed with the patient and family. After consideration of risks, benefits and other options for treatment, the patient has consented to  Procedure(s): LEFT HEART CATH AND CORONARY ANGIOGRAPHY (N/A) as a surgical intervention.  The patient's history has been reviewed, patient examined, no change in status, stable for surgery.  I have reviewed the patient's chart and labs.  Questions were answered to the patient's satisfaction.     Larae Grooms

## 2021-02-06 NOTE — Progress Notes (Signed)
ANTICOAGULATION CONSULT NOTE - Follow Up Consult  Pharmacy Consult for IV heparin Indication: pulmonary embolus  Allergies  Allergen Reactions   Ace Inhibitors     REACTION: Cough    Patient Measurements: Height: 5\' 11"  (180.3 cm) Weight: 73.3 kg (161 lb 9.6 oz) IBW/kg (Calculated) : 75.3 Heparin Dosing Weight: 75 kg  Vital Signs: Temp: 97.9 F (36.6 C) (01/10 1127) Temp Source: Oral (01/10 1127) BP: 113/62 (01/10 1257) Pulse Rate: 64 (01/10 1257)  Labs: Recent Labs    02/03/21 2119 02/04/21 0130 02/04/21 0415 02/04/21 0620 02/04/21 1039 02/04/21 1103 02/04/21 1259 02/05/21 0018 02/06/21 0202  HGB  --   --  12.3*  --   --   --   --  11.8* 11.2*  HCT  --   --  38.6*  --   --   --   --  36.0* 35.3*  PLT  --   --  280  --   --   --   --  260 271  APTT 33  --   --   --   --   --   --   --   --   LABPROT 16.2*  --   --   --   --   --   --   --   --   INR 1.3*  --   --   --   --   --   --   --   --   HEPARINUNFRC  --    < > 0.59  --   --  0.43  --  0.52 0.37  CREATININE  --   --  1.33*  --   --   --   --  1.46* 1.52*  TROPONINIHS 262*   < >  --    < > 212*  --  388* 410*  --    < > = values in this interval not displayed.    Estimated Creatinine Clearance: 40.2 mL/min (A) (by C-G formula based on SCr of 1.52 mg/dL (H)).  Assessment: 81 yo male admitted 02/03/21 for shortness of breath. Patient has had a previous single episode of provoked PE and is no longer on anticoagulation. Pharmacy has now been consulted for heparin. Heparin was initially running at 1200 units/hr and patient was therapeutic. The drip was stopped 1/10 prior to cath procedure.   Heparin is to be resumed 6 hours after sheath removal (removed at 09:22). No bolus because patient received additional heparin during cath procedure. We will resume heparin drip at prior therapeutic rate and will check heparin level in ~8 hours.   Goal of Therapy:  Heparin level 0.3-0.7 units/ml Monitor platelets by  anticoagulation protocol: Yes   Plan:  Restart heparin 1200 units/hr at 15:30 Check heparin level in ~8 hours Daily heparin level, CBC, s/sx of bleeding  Thank you for including pharmacy in the care of this patient.  Zenaida Deed, PharmD PGY1 Acute Care Pharmacy Resident  Phone: [phone] 02/06/2021  4:07 PM  Please check [email or web address] for unit-specific pharmacy phone numbers.

## 2021-02-06 NOTE — Progress Notes (Signed)
Progress Note  Patient Name: Arthur [name] Date of Encounter: 02/06/2021  Primary Cardiologist: Buford Dresser, MD  Subjective  No chest pain rhythm stable   Inpatient Medications    Scheduled Meds:  allopurinol  100 mg Oral Daily   atenolol  50 mg Oral BID   Chlorhexidine Gluconate Cloth  6 each Topical Daily   colchicine  0.6 mg Oral BID   pravastatin  40 mg Oral Daily   sodium chloride flush  3 mL Intravenous Q12H   Continuous Infusions:  sodium chloride     sodium chloride 1 mL/kg/hr (02/06/21 0545)   heparin 1,200 Units/hr (02/06/21 0300)   PRN Meds: sodium chloride, albuterol, loratadine, morphine injection, neomycin-bacitracin-polymyxin, sodium chloride flush   Vital Signs    Vitals:   02/06/21 0545 02/06/21 0600 02/06/21 0615 02/06/21 0630  BP:  135/64    Pulse: 65 63 61 64  Resp: 20 20 16 20   Temp:      TempSrc:      SpO2: 97% 99% 98% 97%  Weight:      Height:        Intake/Output Summary (Last 24 hours) at 02/06/2021 0708 Last data filed at 02/06/2021 8563 Gross per 24 hour  Intake 778.22 ml  Output 625 ml  Net 153.22 ml   Last 3 Weights 02/06/2021 02/04/2021 02/01/2021  Weight (lbs) 161 lb 9.6 oz 165 lb 5.5 oz 163 lb 12.8 oz  Weight (kg) 73.3 kg 75 kg 74.299 kg     Telemetry    SB 50s-NSR 60s with first degree AVB - Personally Reviewed  ECG    NSR 61bpm first degree AVB, nonspecfic TW changes, prior inferior infarct, no acute change from admission - Personally Reviewed  Physical Exam   GEN: No acute distress.  HEENT: Normocephalic, atraumatic, sclera non-icteric. Neck: No JVD or bruits. Cardiac: RRR no murmurs, rubs, or gallops.  Respiratory: Mild end expiratory wheezing otherwise no rales or rhonchi. Breathing is unlabored. GI: Soft, nontender, non-distended, BS +x 4. MS: no deformity. Extremities: No clubbing or cyanosis. No edema. Distal pedal pulses are 2+ and equal bilaterally. Neuro:  AAOx3. Follows commands. Psych:   Responds to questions appropriately with a normal affect.  Labs    High Sensitivity Troponin:   Recent Labs  Lab 02/04/21 0130 02/04/21 0620 02/04/21 1039 02/04/21 1259 02/05/21 0018  TROPONINIHS 283* 260* 212* 388* 410*      Cardiac EnzymesNo results for input(s): TROPONINI in the last 168 hours. No results for input(s): TROPIPOC in the last 168 hours.   Chemistry Recent Labs  Lab 02/01/21 1100 02/03/21 1412 02/04/21 0415 02/05/21 0018 02/06/21 0202  NA 141   < > 135 137 132*  K 4.1   < > 3.4* 3.6 3.9  CL 101   < > 95* 101 98  CO2 26   < > 27 27 24   GLUCOSE 131*   < > 124* 141* 179*  BUN 17   < > 13 17 25*  CREATININE 1.20   < > 1.33* 1.46* 1.52*  CALCIUM 9.2   < > 8.9 7.7* 8.1*  PROT 7.4  --   --   --  6.2*  ALBUMIN 4.0  --   --   --  2.6*  AST 17  --   --   --  28  ALT 10  --   --   --  28  ALKPHOS 189*  --   --   --  137*  BILITOT 0.7  --   --   --  0.8  GFRNONAA  --    < > 54* 48* 46*  ANIONGAP  --    < > 13 9 10    < > = values in this interval not displayed.     Hematology Recent Labs  Lab 02/04/21 0415 02/05/21 0018 02/06/21 0202  WBC 12.7* 11.7* 11.7*  RBC 4.66 4.36 4.22  HGB 12.3* 11.8* 11.2*  HCT 38.6* 36.0* 35.3*  MCV 82.8 82.6 83.6  MCH 26.4 27.1 26.5  MCHC 31.9 32.8 31.7  RDW 14.0 14.0 13.9  PLT 280 260 271    BNP Recent Labs  Lab 02/03/21 1643  BNP 530.8*     DDimer  Recent Labs  Lab 02/03/21 1642  DDIMER >20.00*     Radiology    CT HEAD WO CONTRAST (5MM)  Result Date: 02/04/2021 CLINICAL DATA:  Altered mental status. Shortness of breath and chest tightness. Status post fall. EXAM: CT HEAD WITHOUT CONTRAST TECHNIQUE: Contiguous axial images were obtained from the base of the skull through the vertex without intravenous contrast. COMPARISON:  None. FINDINGS: Brain: No evidence of acute infarction, hemorrhage, hydrocephalus, extra-axial collection or mass lesion/mass effect. Thickening with scattered calcifications identified  along the falx and tentorium. Prominence of sulci and ventricles compatible with brain atrophy. There is mild diffuse low-attenuation within the subcortical and periventricular white matter compatible with chronic microvascular disease. Vascular: No hyperdense vessel or unexpected calcification. Skull: Normal. Negative for fracture or focal lesion. Sinuses/Orbits: No acute finding. Other: None IMPRESSION: 1. No acute intracranial abnormalities. 2. Chronic small vessel ischemic change and brain atrophy. Electronically Signed   By: Kerby Moors M.D.   On: 02/04/2021 13:13   DG Chest Portable 1 View  Addendum Date: 02/04/2021   ADDENDUM REPORT: 02/04/2021 11:15 ADDENDUM: There are old, healed posterior left fourth, fifth and sixth rib fractures. No convincing acute rib fractures. Electronically Signed   By: Lajean Manes M.D.   On: 02/04/2021 11:15   Result Date: 02/04/2021 CLINICAL DATA:  Post CPR. EXAM: PORTABLE CHEST 1 VIEW COMPARISON:  02/03/2021 and older studies. FINDINGS: Cardiac silhouette is normal in size. No mediastinal or hilar masses. Prominent bronchovascular/interstitial markings, stable. No lung consolidation or edema. No convincing pleural effusion and no pneumothorax. Right lower lobe nodule noted on the previous day's CT is not defined. Skeletal structures are grossly intact. IMPRESSION: No acute cardiopulmonary disease. Electronically Signed: By: Lajean Manes M.D. On: 02/04/2021 10:46   ECHOCARDIOGRAM COMPLETE  Result Date: 02/04/2021    ECHOCARDIOGRAM REPORT   Patient Name:   Arthur [name] [facility] Date of Exam: 02/04/2021 Medical Rec #:  [id number]         Height:       71.0 in Accession #:    [id number]        Weight:       163.8 lb Date of Birth:  [birth year]          BSA:          1.937 m Patient Age:    81 years          BP:           117/63 mmHg Patient Gender: M                 HR:           70 bpm. Exam Location:  Inpatient Procedure: 2D Echo, Cardiac Doppler and Color Doppler STAT ECHO  Indications:  Pulmonary Embolus  History:        Patient has prior history of Echocardiogram examinations, most                 recent 05/18/2015. Risk Factors:Diabetes and Hypertension.  Sonographer:    Glo Herring Referring Phys: [id number] [place]  1. Left ventricular ejection fraction, by estimation, is 50 to 55%. The left ventricle has low normal function. The left ventricle demonstrates regional wall motion abnormalities (see scoring diagram/findings for description). There is moderate asymmetric left ventricular hypertrophy of the basal-septal segment. Left ventricular diastolic parameters are consistent with Grade II diastolic dysfunction (pseudonormalization). There is the interventricular septum is flattened in systole, consistent with right ventricular pressure overload.  2. Right ventricular systolic function is mildly reduced. The right ventricular size is normal. There is severely elevated pulmonary artery systolic pressure. The estimated right ventricular systolic pressure is 45.4 mmHg.  3. Right atrial size was mildly dilated.  4. There is no evidence of cardiac tamponade.  5. The mitral valve is grossly normal. Mild mitral valve regurgitation. No evidence of mitral stenosis.  6. The aortic valve was not well visualized. There is moderate calcification of the aortic valve. There is moderate thickening of the aortic valve. Aortic valve regurgitation is not visualized. Mild aortic valve stenosis.  7. The inferior vena cava is normal in size with <50% respiratory variability, suggesting right atrial pressure of 8 mmHg. Comparison(s): Changes from prior study are noted. Conclusion(s)/Recommendation(s): RV with mildly reduced function and normal size. Severely elevated RVSP of 66 mmHg. There is a focal wall motion abnormality in the basal to mid septum and the basal inferior wall. Overall EF is low normal at 50-55%. FINDINGS  Left Ventricle: Left ventricular ejection fraction, by  estimation, is 50 to 55%. The left ventricle has low normal function. The left ventricle demonstrates regional wall motion abnormalities. The left ventricular internal cavity size was normal in size. There is moderate asymmetric left ventricular hypertrophy of the basal-septal segment. The interventricular septum is flattened in systole, consistent with right ventricular pressure overload. Left ventricular diastolic parameters are consistent with Grade II diastolic dysfunction (pseudonormalization).  LV Wall Scoring: The anterior septum, mid inferoseptal segment, basal inferior segment, and basal inferoseptal segment are hypokinetic. The entire anterior wall, entire lateral wall, entire apex, and mid and distal inferior wall are normal. Right Ventricle: The right ventricular size is normal. Right vetricular wall thickness was not well visualized. Right ventricular systolic function is mildly reduced. There is severely elevated pulmonary artery systolic pressure. The tricuspid regurgitant velocity is 3.82 m/s, and with an assumed right atrial pressure of 8 mmHg, the estimated right ventricular systolic pressure is 09.8 mmHg. Left Atrium: Left atrial size was normal in size. Right Atrium: Right atrial size was mildly dilated. Pericardium: Trivial pericardial effusion is present. There is no evidence of cardiac tamponade. Mitral Valve: The mitral valve is grossly normal. Mild mitral valve regurgitation. No evidence of mitral valve stenosis. Tricuspid Valve: The tricuspid valve is normal in structure. Tricuspid valve regurgitation is mild . No evidence of tricuspid stenosis. Aortic Valve: The aortic valve was not well visualized. There is moderate calcification of the aortic valve. There is moderate thickening of the aortic valve. Aortic valve regurgitation is not visualized. Mild aortic stenosis is present. Aortic valve mean gradient measures 6.0 mmHg. Aortic valve peak gradient measures 11.6 mmHg. Aortic valve area,  by VTI measures 1.92 cm. Pulmonic Valve: The pulmonic valve was grossly normal. Pulmonic valve regurgitation  is trivial. No evidence of pulmonic stenosis. Aorta: The aortic root, ascending aorta and aortic arch are all structurally normal, with no evidence of dilitation or obstruction. Venous: The inferior vena cava is normal in size with less than 50% respiratory variability, suggesting right atrial pressure of 8 mmHg. IAS/Shunts: The atrial septum is grossly normal.  LEFT VENTRICLE PLAX 2D LVOT diam:     2.00 cm   Diastology LV SV:         73        LV e' medial:    4.95 cm/s LV SV Index:   37        LV E/e' medial:  18.6 LVOT Area:     3.14 cm  LV e' lateral:   6.30 cm/s                          LV E/e' lateral: 14.6  RIGHT VENTRICLE            IVC RV Basal diam:  3.60 cm    IVC diam: 1.80 cm RV Mid diam:    2.30 cm RV S prime:     8.76 cm/s LEFT ATRIUM           Index        RIGHT ATRIUM           Index LA Vol (A4C): 39.8 ml 20.55 ml/m  RA Area:     19.80 cm                                    RA Volume:   56.80 ml  29.32 ml/m  AORTIC VALVE                     PULMONIC VALVE AV Area (Vmax):    2.07 cm      PV Vmax:       0.76 m/s AV Area (Vmean):   1.90 cm      PV Peak grad:  2.3 mmHg AV Area (VTI):     1.92 cm AV Vmax:           170.00 cm/s AV Vmean:          117.000 cm/s AV VTI:            0.377 m AV Peak Grad:      11.6 mmHg AV Mean Grad:      6.0 mmHg LVOT Vmax:         112.00 cm/s LVOT Vmean:        70.700 cm/s LVOT VTI:          0.231 m LVOT/AV VTI ratio: 0.61  AORTA Ao Root diam: 3.20 cm Ao Asc diam:  3.30 cm MITRAL VALVE               TRICUSPID VALVE MV Area (PHT): 4.21 cm    TR Peak grad:   58.4 mmHg MV Decel Time: 180 msec    TR Vmax:        382.00 cm/s MV E velocity: 92.10 cm/s MV A velocity: 68.10 cm/s  SHUNTS MV E/A ratio:  1.35        Systemic VTI:  0.23 m                            Systemic Diam: 2.00  cm Buford Dresser MD Electronically signed by Buford Dresser MD Signature  Date/Time: 02/04/2021/3:33:24 PM    Final     Cardiac Studies   2D echo 02/04/21    1. Left ventricular ejection fraction, by estimation, is 50 to 55%. The  left ventricle has low normal function. The left ventricle demonstrates  regional wall motion abnormalities (see scoring diagram/findings for  description). There is moderate  asymmetric left ventricular hypertrophy of the basal-septal segment. Left  ventricular diastolic parameters are consistent with Grade II diastolic  dysfunction (pseudonormalization). There is the interventricular septum is  flattened in systole, consistent  with right ventricular pressure overload.   2. Right ventricular systolic function is mildly reduced. The right  ventricular size is normal. There is severely elevated pulmonary artery  systolic pressure. The estimated right ventricular systolic pressure is  16.1 mmHg.   3. Right atrial size was mildly dilated.   4. There is no evidence of cardiac tamponade.   5. The mitral valve is grossly normal. Mild mitral valve regurgitation.  No evidence of mitral stenosis.   6. The aortic valve was not well visualized. There is moderate  calcification of the aortic valve. There is moderate thickening of the  aortic valve. Aortic valve regurgitation is not visualized. Mild aortic  valve stenosis.   7. The inferior vena cava is normal in size with <50% respiratory  variability, suggesting right atrial pressure of 8 mmHg.   Comparison(s): Changes from prior study are noted.   Conclusion(s)/Recommendation(s): RV with mildly reduced function and  normal size. Severely elevated RVSP of 66 mmHg. There is a focal wall  motion abnormality in the basal to mid septum and the basal inferior wall.  Overall EF is low normal at 50-55%.   Patient Profile     82 y.o. male with hx of DVT/PE 2017, pulmonary nodules, HTN, DM, HLD, GERD who presented 1/7 with worsening dyspnea on exertion. Labs showed mildly elevated flat troponin  in the 200s. CT angio showed  with PE within the distal right pulmonary artery, segmental and subsegmental right upper and middle lobe, negative for right heart strain, increased RLL nodule 28mm concerning for metastatic disease. While awaiting admission in the ED, got up to use the bathroom then upon return complained of SOB/chest tightness and became unresponsive/pulseless. Received CPR, epi and obtained ROSC after 5 minutes. EKG done just prior to arrest showed WCT concerning for VT. Once regained pulse, he was awake and protecting his airway. Started on IV amiodarone with bolus. Cardiology called for further evaluation.   Assessment & Plan    1. Recurrent DVT/PE - anticoag per primary team/PCCM -> per notes they have considered full dose lytics but family wanted to avoid and would prefer treatment with just heparin; plan to reconsider if - start oral anticoagulation pending cath this am   2. Cardiac arrest with VT -> PEA -> asystole - unclear if this is related to primary problem or secondary issue I.e. related to coronary ischemia - last troponin was 410 - Radial cath this am with Dr Irish Lack Heparin to be held on call to cath lab and resumed ASAP post after radial band removed   3. Pulmonary HTN - PA pressures very elevated in severe rate and RVF is mildly reduced- this plus VT is concerning - right heart cath today   4. Pulmonary nodules - CT on admission showed interim development of multiple pulmonary nodules measuring up to 18 mm in the right lower lobe suspicious for metastatic  disease. Mediastinal and hilar adenopathy, also suspect for metastatic disease - Daughter and patient aware of diagnosis cancer   5. Essential HTN - norvasc held on atenolol   6. Hyperlipidemia - maintained on pravastatin 40mg  daily - LDL 71   7. Hypokalemia/hypomagnsemia/hypocalcemia - primary team managing electrolytes - K 3.9 Cr 1.52 BUN 25 Mg 2.2    For questions or updates, please contact [facility] Please consult [email or web address] for contact info under Cardiology/STEMI.  Signed, Jenkins Rouge, MD 02/06/2021, 7:08 AM

## 2021-02-06 NOTE — Progress Notes (Signed)
FPTS Brief Progress Note  S:Denies any pain after heart cath today. Resting comfortably in bed.  O: BP (!) 146/71 (BP Location: Left Arm)    Pulse 68    Temp 97.9 F (36.6 C) (Oral)    Resp 17    Ht 5\' 11"  (1.803 m)    Wt 73.3 kg    SpO2 94%    BMI 22.54 kg/m   General: NAD, awake, alert, responsive to questions CV: Regular rate and rhythm no murmurs rubs or gallops Respiratory: no increased work of breathing Abdomen: Soft, non-tender, non-distended, normoactive bowel sounds  Extremities: Moves upper and lower extremities freely, no edema in LE  A/P: Cardiac arrest with VT -> PEA -> asystole -Pain control with morphine (severe pain) and tylenol -family had questions regarding imaging and whether possibility of getting it done in hospital rather than outpatient. Will relay to day team - Orders reviewed. Labs for AM ordered, which was adjusted as needed.   Gerrit Heck, MD 02/06/2021, 8:06 PM PGY-1, [facility] Family Medicine Night Resident  Please page [phone] with questions.

## 2021-02-06 NOTE — Progress Notes (Signed)
Chaplain Melvenia Beam asked to bring patient a new Advanced Directive packet for completion, as previous one was misplaced. Arrived at bedside of Arthur [name] and introduced myself. His wife and one of his sons were present in the room at this time. Patient asked me to explain the process to his daughter over the telephone and handed me the phone, and she was on speaker. She advised that second brother who traveled from out of state was in route, and asked if I could explain the A.D. process to both brothers and facilitate its completion. [name] lunch tray arrived, so advised him to go ahead and eat and when both sons were present I would assist. Spoke with patient's nurse and advised her that I would be available to assist.

## 2021-02-06 NOTE — Progress Notes (Signed)
Family Medicine Teaching Service Daily Progress Note Intern Pager: [phone]  Patient name: Arthur [name] Medical record number: [id number] Date of birth: Nov 07, 1940 Age: 81 y.o. Gender: male  Primary Care Provider: Lind Covert, MD Consultants: Cardiology, CCM Code Status: Full  Pt Overview and Major Events to Date:  1/7 - Admission 1/8 -  Brief Cardiac Arrest 1/10 - Left Heart Cath  Assessment and Plan:  81 y/o male admitted 1/7 with dyspnea, found to have a multiple PE, hx of PE in 2017.  Initially treated with heparin, then had a cardiac arrest on the floor.  Cardiac arrest with VT -> PEA -> asystole Patient arrested  in the setting of VT requiring several minutes of CPR and epi but no defib, before achieving ROSC. Troponin 410 yesterday, increased from 388 the day before, and 212 previously. Given cardiac risk factors: CAD > 20 years, DM, HTN, HLD thus heart cath today. Consider repeat CT angiogream if LHC normal. Echo with low normal LVEF. -Telemetry -Cardiology following, appreciate recs -Continuous Cardiac monitoring -Supplemental O2 as needed -F/u heart cath  Recurrent DVT/PE   PE in 2017   Pulm HTN Acute pulmonary emboli within the distal right pulmonary artery, segmental, and subsegmental right upper and middle lobe pulmonary vessels. Family requesting treatment with heparin instead of lytics. History of PE in 2017,  treated with Xarelto for 6 months then stopped. PA pressures elevated and RVF is mildly reduced. Consider outpatient echo after starting anticoagulation. -Heparin per pharmacy, consider switch to apixiban after cath -Consider RHC + pulm HTN w/u  Pulmonary nodules  Multiple pulmonary nodules measuring up to 18 mm located in RUL, RLL, LLL suspicious for metastatic disease. Mediastinal and hilar adenopathy also concerning for metastatic disease.  -PET CT outpatient  CKD IIIa GFR 46 today, from 71 yesterday. Cr 1.52 today from 1.46 yesterday.  Continues to rise. BUN/CR ratio 25/1.52 = 16.4, less than 20:1, etiology not likely pre-renal. UOP 0.4 ml/kg/hr, patient may benefit from fluid bolus.  -Hold HCTZ and Losartan  Leukocytosis WBC stable from yesterday at 11.7, overall trending down.  Likely elevated in setting of pulmonary emboli. No further infectious work up due to lack of symptoms -Continue to monitor CBC  DM2 Metformin 1000 mg BID as home med, Glc 179, (186, 197, 139, 168). Sugars well controlled. -SSI while inpatient  Hyperlipidemia Lipid panel normal, goal LDL less than 70, patient slightly above goal    Component Value Date/Time   CHOL 133 02/06/2021 0202   CHOL 157 09/08/2019 1011   TRIG 97 02/06/2021 0202   HDL 43 02/06/2021 0202   HDL 41 09/08/2019 1011   CHOLHDL 3.1 02/06/2021 0202   VLDL 19 02/06/2021 0202   LDLCALC 71 02/06/2021 0202   LDLCALC 82 09/08/2019 1011   LDLDIRECT 96 06/16/2018 1041   LABVLDL 34 09/08/2019 1011  -discontinue pravastatin, start Crestor 20 mg daily.    Hypertension BP's have been 99-135/57-64. Home meds: atenolol 50 mg BID, Losartan 100 mg daily, HCTZ 12.5 mg daily, amlodipine 5 mg daily -Hold Amlodipine, Losartan, HCTZ -Continue atenolol 50 mg BID   Hyponatremia  132 today, from 137 yesterday. May be 2/2 renal dysfunction s/p arrest. -continue to monitor   FEN/GI: Regular PPx: Systemic heparin Dispo:Home pending clinical improvement .    Subjective:  No complaints, patient is in good spirits and had LHC today. No complaints of pain.   Objective: Temp:  [97.8 F (36.6 C)-98.6 F (37 C)] 97.8 F (36.6 C) (01/10 0322) Pulse  Rate:  [57-67] 64 (01/10 0630) Resp:  [14-24] 20 (01/10 0630) BP: (99-138)/(44-65) 135/64 (01/10 0600) SpO2:  [90 %-99 %] 97 % (01/10 0630) Weight:  [73.3 kg] 73.3 kg (01/10 0338) Physical Exam: General: Well appearing, sitting up in bed, NAD, male Cardiovascular: RRR, [place] Respiratory: CTABL Abdomen: Soft, NTTP,  non-distended Extremities: Moving all extremities independently, no edema  Laboratory: Recent Labs  Lab 02/04/21 0415 02/05/21 0018 02/06/21 0202  WBC 12.7* 11.7* 11.7*  HGB 12.3* 11.8* 11.2*  HCT 38.6* 36.0* 35.3*  PLT 280 260 271   Recent Labs  Lab 02/01/21 1100 02/03/21 1412 02/04/21 0415 02/05/21 0018 02/06/21 0202  NA 141   < > 135 137 132*  K 4.1   < > 3.4* 3.6 3.9  CL 101   < > 95* 101 98  CO2 26   < > 27 27 24   BUN 17   < > 13 17 25*  CREATININE 1.20   < > 1.33* 1.46* 1.52*  CALCIUM 9.2   < > 8.9 7.7* 8.1*  PROT 7.4  --   --   --  6.2*  BILITOT 0.7  --   --   --  0.8  ALKPHOS 189*  --   --   --  137*  ALT 10  --   --   --  28  AST 17  --   --   --  28  GLUCOSE 131*   < > 124* 141* 179*   < > = values in this interval not displayed.      Imaging/Diagnostic Tests:   Holley Bouche, MD 02/06/2021, 7:42 AM PGY-1, [place] Intern pager: [phone], text pages welcome

## 2021-02-06 NOTE — Care Management (Signed)
°  Transition of Care [facility]) Screening Note   Patient Details  Name: Arthur [name] Date of Birth: 02-28-1940   [facility]) CM/SW Contact:    Bethena Roys, RN Phone Number: 02/06/2021, 4:18 PM    Transition of Care Department [facility]) has reviewed the patient. Patient is a transfer from [facility]. Case Manager received a call from the nurse that the patient would like a hospital bed for home. Patient will benefit from PT/OT for recommendations and durable medical equipment.

## 2021-02-06 NOTE — H&P (View-Only) (Signed)
Progress Note  Patient Name: Arthur [name] Date of Encounter: 02/06/2021  Primary Cardiologist: Buford Dresser, MD  Subjective  No chest pain rhythm stable   Inpatient Medications    Scheduled Meds:  allopurinol  100 mg Oral Daily   atenolol  50 mg Oral BID   Chlorhexidine Gluconate Cloth  6 each Topical Daily   colchicine  0.6 mg Oral BID   pravastatin  40 mg Oral Daily   sodium chloride flush  3 mL Intravenous Q12H   Continuous Infusions:  sodium chloride     sodium chloride 1 mL/kg/hr (02/06/21 0545)   heparin 1,200 Units/hr (02/06/21 0300)   PRN Meds: sodium chloride, albuterol, loratadine, morphine injection, neomycin-bacitracin-polymyxin, sodium chloride flush   Vital Signs    Vitals:   02/06/21 0545 02/06/21 0600 02/06/21 0615 02/06/21 0630  BP:  135/64    Pulse: 65 63 61 64  Resp: 20 20 16 20   Temp:      TempSrc:      SpO2: 97% 99% 98% 97%  Weight:      Height:        Intake/Output Summary (Last 24 hours) at 02/06/2021 0708 Last data filed at 02/06/2021 0277 Gross per 24 hour  Intake 778.22 ml  Output 625 ml  Net 153.22 ml   Last 3 Weights 02/06/2021 02/04/2021 02/01/2021  Weight (lbs) 161 lb 9.6 oz 165 lb 5.5 oz 163 lb 12.8 oz  Weight (kg) 73.3 kg 75 kg 74.299 kg     Telemetry    SB 50s-NSR 60s with first degree AVB - Personally Reviewed  ECG    NSR 61bpm first degree AVB, nonspecfic TW changes, prior inferior infarct, no acute change from admission - Personally Reviewed  Physical Exam   GEN: No acute distress.  HEENT: Normocephalic, atraumatic, sclera non-icteric. Neck: No JVD or bruits. Cardiac: RRR no murmurs, rubs, or gallops.  Respiratory: Mild end expiratory wheezing otherwise no rales or rhonchi. Breathing is unlabored. GI: Soft, nontender, non-distended, BS +x 4. MS: no deformity. Extremities: No clubbing or cyanosis. No edema. Distal pedal pulses are 2+ and equal bilaterally. Neuro:  AAOx3. Follows commands. Psych:   Responds to questions appropriately with a normal affect.  Labs    High Sensitivity Troponin:   Recent Labs  Lab 02/04/21 0130 02/04/21 0620 02/04/21 1039 02/04/21 1259 02/05/21 0018  TROPONINIHS 283* 260* 212* 388* 410*      Cardiac EnzymesNo results for input(s): TROPONINI in the last 168 hours. No results for input(s): TROPIPOC in the last 168 hours.   Chemistry Recent Labs  Lab 02/01/21 1100 02/03/21 1412 02/04/21 0415 02/05/21 0018 02/06/21 0202  NA 141   < > 135 137 132*  K 4.1   < > 3.4* 3.6 3.9  CL 101   < > 95* 101 98  CO2 26   < > 27 27 24   GLUCOSE 131*   < > 124* 141* 179*  BUN 17   < > 13 17 25*  CREATININE 1.20   < > 1.33* 1.46* 1.52*  CALCIUM 9.2   < > 8.9 7.7* 8.1*  PROT 7.4  --   --   --  6.2*  ALBUMIN 4.0  --   --   --  2.6*  AST 17  --   --   --  28  ALT 10  --   --   --  28  ALKPHOS 189*  --   --   --  137*  BILITOT 0.7  --   --   --  0.8  GFRNONAA  --    < > 54* 48* 46*  ANIONGAP  --    < > 13 9 10    < > = values in this interval not displayed.     Hematology Recent Labs  Lab 02/04/21 0415 02/05/21 0018 02/06/21 0202  WBC 12.7* 11.7* 11.7*  RBC 4.66 4.36 4.22  HGB 12.3* 11.8* 11.2*  HCT 38.6* 36.0* 35.3*  MCV 82.8 82.6 83.6  MCH 26.4 27.1 26.5  MCHC 31.9 32.8 31.7  RDW 14.0 14.0 13.9  PLT 280 260 271    BNP Recent Labs  Lab 02/03/21 1643  BNP 530.8*     DDimer  Recent Labs  Lab 02/03/21 1642  DDIMER >20.00*     Radiology    CT HEAD WO CONTRAST (5MM)  Result Date: 02/04/2021 CLINICAL DATA:  Altered mental status. Shortness of breath and chest tightness. Status post fall. EXAM: CT HEAD WITHOUT CONTRAST TECHNIQUE: Contiguous axial images were obtained from the base of the skull through the vertex without intravenous contrast. COMPARISON:  None. FINDINGS: Brain: No evidence of acute infarction, hemorrhage, hydrocephalus, extra-axial collection or mass lesion/mass effect. Thickening with scattered calcifications identified  along the falx and tentorium. Prominence of sulci and ventricles compatible with brain atrophy. There is mild diffuse low-attenuation within the subcortical and periventricular white matter compatible with chronic microvascular disease. Vascular: No hyperdense vessel or unexpected calcification. Skull: Normal. Negative for fracture or focal lesion. Sinuses/Orbits: No acute finding. Other: None IMPRESSION: 1. No acute intracranial abnormalities. 2. Chronic small vessel ischemic change and brain atrophy. Electronically Signed   By: Kerby Moors M.D.   On: 02/04/2021 13:13   DG Chest Portable 1 View  Addendum Date: 02/04/2021   ADDENDUM REPORT: 02/04/2021 11:15 ADDENDUM: There are old, healed posterior left fourth, fifth and sixth rib fractures. No convincing acute rib fractures. Electronically Signed   By: Lajean Manes M.D.   On: 02/04/2021 11:15   Result Date: 02/04/2021 CLINICAL DATA:  Post CPR. EXAM: PORTABLE CHEST 1 VIEW COMPARISON:  02/03/2021 and older studies. FINDINGS: Cardiac silhouette is normal in size. No mediastinal or hilar masses. Prominent bronchovascular/interstitial markings, stable. No lung consolidation or edema. No convincing pleural effusion and no pneumothorax. Right lower lobe nodule noted on the previous day's CT is not defined. Skeletal structures are grossly intact. IMPRESSION: No acute cardiopulmonary disease. Electronically Signed: By: Lajean Manes M.D. On: 02/04/2021 10:46   ECHOCARDIOGRAM COMPLETE  Result Date: 02/04/2021    ECHOCARDIOGRAM REPORT   Patient Name:   Arthur [name] [facility] Date of Exam: 02/04/2021 Medical Rec #:  [id number]         Height:       71.0 in Accession #:    [id number]        Weight:       163.8 lb Date of Birth:  10/20/1940          BSA:          1.937 m Patient Age:    81 years          BP:           117/63 mmHg Patient Gender: M                 HR:           70 bpm. Exam Location:  Inpatient Procedure: 2D Echo, Cardiac Doppler and Color Doppler STAT ECHO  Indications:  Pulmonary Embolus  History:        Patient has prior history of Echocardiogram examinations, most                 recent 05/18/2015. Risk Factors:Diabetes and Hypertension.  Sonographer:    Glo Herring Referring Phys: [id number] [place]  1. Left ventricular ejection fraction, by estimation, is 50 to 55%. The left ventricle has low normal function. The left ventricle demonstrates regional wall motion abnormalities (see scoring diagram/findings for description). There is moderate asymmetric left ventricular hypertrophy of the basal-septal segment. Left ventricular diastolic parameters are consistent with Grade II diastolic dysfunction (pseudonormalization). There is the interventricular septum is flattened in systole, consistent with right ventricular pressure overload.  2. Right ventricular systolic function is mildly reduced. The right ventricular size is normal. There is severely elevated pulmonary artery systolic pressure. The estimated right ventricular systolic pressure is 46.2 mmHg.  3. Right atrial size was mildly dilated.  4. There is no evidence of cardiac tamponade.  5. The mitral valve is grossly normal. Mild mitral valve regurgitation. No evidence of mitral stenosis.  6. The aortic valve was not well visualized. There is moderate calcification of the aortic valve. There is moderate thickening of the aortic valve. Aortic valve regurgitation is not visualized. Mild aortic valve stenosis.  7. The inferior vena cava is normal in size with <50% respiratory variability, suggesting right atrial pressure of 8 mmHg. Comparison(s): Changes from prior study are noted. Conclusion(s)/Recommendation(s): RV with mildly reduced function and normal size. Severely elevated RVSP of 66 mmHg. There is a focal wall motion abnormality in the basal to mid septum and the basal inferior wall. Overall EF is low normal at 50-55%. FINDINGS  Left Ventricle: Left ventricular ejection fraction, by  estimation, is 50 to 55%. The left ventricle has low normal function. The left ventricle demonstrates regional wall motion abnormalities. The left ventricular internal cavity size was normal in size. There is moderate asymmetric left ventricular hypertrophy of the basal-septal segment. The interventricular septum is flattened in systole, consistent with right ventricular pressure overload. Left ventricular diastolic parameters are consistent with Grade II diastolic dysfunction (pseudonormalization).  LV Wall Scoring: The anterior septum, mid inferoseptal segment, basal inferior segment, and basal inferoseptal segment are hypokinetic. The entire anterior wall, entire lateral wall, entire apex, and mid and distal inferior wall are normal. Right Ventricle: The right ventricular size is normal. Right vetricular wall thickness was not well visualized. Right ventricular systolic function is mildly reduced. There is severely elevated pulmonary artery systolic pressure. The tricuspid regurgitant velocity is 3.82 m/s, and with an assumed right atrial pressure of 8 mmHg, the estimated right ventricular systolic pressure is 70.3 mmHg. Left Atrium: Left atrial size was normal in size. Right Atrium: Right atrial size was mildly dilated. Pericardium: Trivial pericardial effusion is present. There is no evidence of cardiac tamponade. Mitral Valve: The mitral valve is grossly normal. Mild mitral valve regurgitation. No evidence of mitral valve stenosis. Tricuspid Valve: The tricuspid valve is normal in structure. Tricuspid valve regurgitation is mild . No evidence of tricuspid stenosis. Aortic Valve: The aortic valve was not well visualized. There is moderate calcification of the aortic valve. There is moderate thickening of the aortic valve. Aortic valve regurgitation is not visualized. Mild aortic stenosis is present. Aortic valve mean gradient measures 6.0 mmHg. Aortic valve peak gradient measures 11.6 mmHg. Aortic valve area,  by VTI measures 1.92 cm. Pulmonic Valve: The pulmonic valve was grossly normal. Pulmonic valve regurgitation  is trivial. No evidence of pulmonic stenosis. Aorta: The aortic root, ascending aorta and aortic arch are all structurally normal, with no evidence of dilitation or obstruction. Venous: The inferior vena cava is normal in size with less than 50% respiratory variability, suggesting right atrial pressure of 8 mmHg. IAS/Shunts: The atrial septum is grossly normal.  LEFT VENTRICLE PLAX 2D LVOT diam:     2.00 cm   Diastology LV SV:         73        LV e' medial:    4.95 cm/s LV SV Index:   37        LV E/e' medial:  18.6 LVOT Area:     3.14 cm  LV e' lateral:   6.30 cm/s                          LV E/e' lateral: 14.6  RIGHT VENTRICLE            IVC RV Basal diam:  3.60 cm    IVC diam: 1.80 cm RV Mid diam:    2.30 cm RV S prime:     8.76 cm/s LEFT ATRIUM           Index        RIGHT ATRIUM           Index LA Vol (A4C): 39.8 ml 20.55 ml/m  RA Area:     19.80 cm                                    RA Volume:   56.80 ml  29.32 ml/m  AORTIC VALVE                     PULMONIC VALVE AV Area (Vmax):    2.07 cm      PV Vmax:       0.76 m/s AV Area (Vmean):   1.90 cm      PV Peak grad:  2.3 mmHg AV Area (VTI):     1.92 cm AV Vmax:           170.00 cm/s AV Vmean:          117.000 cm/s AV VTI:            0.377 m AV Peak Grad:      11.6 mmHg AV Mean Grad:      6.0 mmHg LVOT Vmax:         112.00 cm/s LVOT Vmean:        70.700 cm/s LVOT VTI:          0.231 m LVOT/AV VTI ratio: 0.61  AORTA Ao Root diam: 3.20 cm Ao Asc diam:  3.30 cm MITRAL VALVE               TRICUSPID VALVE MV Area (PHT): 4.21 cm    TR Peak grad:   58.4 mmHg MV Decel Time: 180 msec    TR Vmax:        382.00 cm/s MV E velocity: 92.10 cm/s MV A velocity: 68.10 cm/s  SHUNTS MV E/A ratio:  1.35        Systemic VTI:  0.23 m                            Systemic Diam: 2.00  cm Buford Dresser MD Electronically signed by Buford Dresser MD Signature  Date/Time: 02/04/2021/3:33:24 PM    Final     Cardiac Studies   2D echo 02/04/21    1. Left ventricular ejection fraction, by estimation, is 50 to 55%. The  left ventricle has low normal function. The left ventricle demonstrates  regional wall motion abnormalities (see scoring diagram/findings for  description). There is moderate  asymmetric left ventricular hypertrophy of the basal-septal segment. Left  ventricular diastolic parameters are consistent with Grade II diastolic  dysfunction (pseudonormalization). There is the interventricular septum is  flattened in systole, consistent  with right ventricular pressure overload.   2. Right ventricular systolic function is mildly reduced. The right  ventricular size is normal. There is severely elevated pulmonary artery  systolic pressure. The estimated right ventricular systolic pressure is  85.6 mmHg.   3. Right atrial size was mildly dilated.   4. There is no evidence of cardiac tamponade.   5. The mitral valve is grossly normal. Mild mitral valve regurgitation.  No evidence of mitral stenosis.   6. The aortic valve was not well visualized. There is moderate  calcification of the aortic valve. There is moderate thickening of the  aortic valve. Aortic valve regurgitation is not visualized. Mild aortic  valve stenosis.   7. The inferior vena cava is normal in size with <50% respiratory  variability, suggesting right atrial pressure of 8 mmHg.   Comparison(s): Changes from prior study are noted.   Conclusion(s)/Recommendation(s): RV with mildly reduced function and  normal size. Severely elevated RVSP of 66 mmHg. There is a focal wall  motion abnormality in the basal to mid septum and the basal inferior wall.  Overall EF is low normal at 50-55%.   Patient Profile     81 y.o. male with hx of DVT/PE 2017, pulmonary nodules, HTN, DM, HLD, GERD who presented 1/7 with worsening dyspnea on exertion. Labs showed mildly elevated flat troponin  in the 200s. CT angio showed  with PE within the distal right pulmonary artery, segmental and subsegmental right upper and middle lobe, negative for right heart strain, increased RLL nodule 106mm concerning for metastatic disease. While awaiting admission in the ED, got up to use the bathroom then upon return complained of SOB/chest tightness and became unresponsive/pulseless. Received CPR, epi and obtained ROSC after 5 minutes. EKG done just prior to arrest showed WCT concerning for VT. Once regained pulse, he was awake and protecting his airway. Started on IV amiodarone with bolus. Cardiology called for further evaluation.   Assessment & Plan    1. Recurrent DVT/PE - anticoag per primary team/PCCM -> per notes they have considered full dose lytics but family wanted to avoid and would prefer treatment with just heparin; plan to reconsider if - start oral anticoagulation pending cath this am   2. Cardiac arrest with VT -> PEA -> asystole - unclear if this is related to primary problem or secondary issue I.e. related to coronary ischemia - last troponin was 410 - Radial cath this am with Dr Irish Lack Heparin to be held on call to cath lab and resumed ASAP post after radial band removed   3. Pulmonary HTN - PA pressures very elevated in severe rate and RVF is mildly reduced- this plus VT is concerning - right heart cath today   4. Pulmonary nodules - CT on admission showed interim development of multiple pulmonary nodules measuring up to 18 mm in the right lower lobe suspicious for metastatic  disease. Mediastinal and hilar adenopathy, also suspect for metastatic disease - Daughter and patient aware of diagnosis cancer   5. Essential HTN - norvasc held on atenolol   6. Hyperlipidemia - maintained on pravastatin 40mg  daily - LDL 71   7. Hypokalemia/hypomagnsemia/hypocalcemia - primary team managing electrolytes - K 3.9 Cr 1.52 BUN 25 Mg 2.2    For questions or updates, please contact [facility] Please consult [email or web address] for contact info under Cardiology/STEMI.  Signed, Jenkins Rouge, MD 02/06/2021, 7:08 AM

## 2021-02-07 DIAGNOSIS — I2699 Other pulmonary embolism without acute cor pulmonale: Secondary | ICD-10-CM | POA: Diagnosis not present

## 2021-02-07 DIAGNOSIS — I469 Cardiac arrest, cause unspecified: Secondary | ICD-10-CM | POA: Diagnosis not present

## 2021-02-07 DIAGNOSIS — I2510 Atherosclerotic heart disease of native coronary artery without angina pectoris: Secondary | ICD-10-CM

## 2021-02-07 DIAGNOSIS — I2694 Multiple subsegmental pulmonary emboli without acute cor pulmonale: Secondary | ICD-10-CM | POA: Diagnosis not present

## 2021-02-07 DIAGNOSIS — Z955 Presence of coronary angioplasty implant and graft: Secondary | ICD-10-CM

## 2021-02-07 DIAGNOSIS — I10 Essential (primary) hypertension: Secondary | ICD-10-CM | POA: Diagnosis not present

## 2021-02-07 LAB — CBC
HCT: 36.3 % — ABNORMAL LOW (ref 39.0–52.0)
HCT: 36.3 % — ABNORMAL LOW (ref 39.0–52.0)
Hemoglobin: 11.6 g/dL — ABNORMAL LOW (ref 13.0–17.0)
Hemoglobin: 11.8 g/dL — ABNORMAL LOW (ref 13.0–17.0)
MCH: 26.5 pg (ref 26.0–34.0)
MCH: 26.8 pg (ref 26.0–34.0)
MCHC: 32 g/dL (ref 30.0–36.0)
MCHC: 32.5 g/dL (ref 30.0–36.0)
MCV: 83.1 fL (ref 80.0–100.0)
MCV: 82.3 fL (ref 80.0–100.0)
Platelets: 274 10*3/uL (ref 150–400)
Platelets: 318 10*3/uL (ref 150–400)
RBC: 4.37 MIL/uL (ref 4.22–5.81)
RBC: 4.41 MIL/uL (ref 4.22–5.81)
RDW: 13.8 % (ref 11.5–15.5)
RDW: 13.7 % (ref 11.5–15.5)
WBC: 11.5 10*3/uL — ABNORMAL HIGH (ref 4.0–10.5)
WBC: 11.8 10*3/uL — ABNORMAL HIGH (ref 4.0–10.5)
nRBC: 0 % (ref 0.0–0.2)
nRBC: 0 % (ref 0.0–0.2)

## 2021-02-07 LAB — GLUCOSE, CAPILLARY
Glucose-Capillary: 125 mg/dL — ABNORMAL HIGH (ref 70–99)
Glucose-Capillary: 126 mg/dL — ABNORMAL HIGH (ref 70–99)
Glucose-Capillary: 181 mg/dL — ABNORMAL HIGH (ref 70–99)

## 2021-02-07 LAB — BASIC METABOLIC PANEL
Anion gap: 9 (ref 5–15)
Anion gap: 8 (ref 5–15)
BUN: 19 mg/dL (ref 8–23)
BUN: 20 mg/dL (ref 8–23)
CO2: 23 mmol/L (ref 22–32)
CO2: 23 mmol/L (ref 22–32)
Calcium: 8.4 mg/dL — ABNORMAL LOW (ref 8.9–10.3)
Calcium: 8.5 mg/dL — ABNORMAL LOW (ref 8.9–10.3)
Chloride: 102 mmol/L (ref 98–111)
Chloride: 101 mmol/L (ref 98–111)
Creatinine, Ser: 1.26 mg/dL — ABNORMAL HIGH (ref 0.61–1.24)
Creatinine, Ser: 1.08 mg/dL (ref 0.61–1.24)
GFR, Estimated: 58 mL/min — ABNORMAL LOW (ref >60)
GFR, Estimated: >60 mL/min (ref >60)
Glucose, Bld: 138 mg/dL — ABNORMAL HIGH (ref 70–99)
Glucose, Bld: 151 mg/dL — ABNORMAL HIGH (ref 70–99)
Potassium: 3.9 mmol/L (ref 3.5–5.1)
Potassium: 4.2 mmol/L (ref 3.5–5.1)
Sodium: 134 mmol/L — ABNORMAL LOW (ref 135–145)
Sodium: 132 mmol/L — ABNORMAL LOW (ref 135–145)

## 2021-02-07 LAB — HEPARIN LEVEL (UNFRACTIONATED)
Heparin Unfractionated: 0.38 IU/mL (ref 0.30–0.70)
Heparin Unfractionated: 0.46 IU/mL (ref 0.30–0.70)

## 2021-02-07 LAB — MAGNESIUM: Magnesium: 2.1 mg/dL (ref 1.7–2.4)

## 2021-02-07 MED ORDER — GUAIFENESIN-DM 100-10 MG/5ML PO SYRP
5.0000 mL | ORAL_SOLUTION | ORAL | Status: DC | PRN
  Administered 2021-02-07 – 2021-02-08 (×2): 5 mL via ORAL
  Filled 2021-02-07 (×2): qty 5

## 2021-02-07 MED ORDER — APIXABAN 5 MG PO TABS
5.0000 mg | ORAL_TABLET | Freq: Two times a day (BID) | ORAL | Status: DC
  Administered 2021-02-07 – 2021-02-08 (×3): 5 mg via ORAL
  Filled 2021-02-07 (×3): qty 1

## 2021-02-07 MED ORDER — APIXABAN 5 MG PO TABS: 10.0000 mg | ORAL_TABLET | Freq: Two times a day (BID) | ORAL | Status: DC

## 2021-02-07 MED ORDER — APIXABAN 5 MG PO TABS: 5.0000 mg | ORAL_TABLET | Freq: Two times a day (BID) | ORAL | Status: DC

## 2021-02-07 NOTE — Progress Notes (Signed)
Progress Note  Patient Name: Arthur [name] Date of Encounter: 02/07/2021  Primary Cardiologist: Buford Dresser, MD  Subjective  No cardiac complaints   Inpatient Medications    Scheduled Meds:  allopurinol  100 mg Oral Daily   aspirin  81 mg Oral Daily   atenolol  50 mg Oral BID   Chlorhexidine Gluconate Cloth  6 each Topical Daily   clopidogrel  75 mg Oral Q breakfast   colchicine  0.6 mg Oral BID   insulin aspart  0-9 Units Subcutaneous TID WC   rosuvastatin  20 mg Oral Daily   sodium chloride flush  3 mL Intravenous Q12H   sodium chloride flush  3 mL Intravenous Q12H   Continuous Infusions:  sodium chloride     heparin 1,200 Units/hr (02/06/21 1548)   PRN Meds: sodium chloride, acetaminophen, albuterol, loratadine, morphine injection, neomycin-bacitracin-polymyxin, ondansetron (ZOFRAN) IV, sodium chloride flush   Vital Signs    Vitals:   02/06/21 1457 02/06/21 2004 02/07/21 0637 02/07/21 0826  BP: 127/74 (!) 146/71  (!) 142/56  Pulse: 64 68 67 73  Resp:  17 18 18   Temp:   97.7 F (36.5 C) 98 F (36.7 C)  TempSrc:  Oral Oral Oral  SpO2: 92% 94% 95% 92%  Weight:      Height:        Intake/Output Summary (Last 24 hours) at 02/07/2021 0844 Last data filed at 02/07/2021 0413 Gross per 24 hour  Intake 1616 ml  Output 215 ml  Net 1401 ml   Last 3 Weights 02/06/2021 02/04/2021 02/01/2021  Weight (lbs) 161 lb 9.6 oz 165 lb 5.5 oz 163 lb 12.8 oz  Weight (kg) 73.3 kg 75 kg 74.299 kg     Telemetry    SB 50s-NSR 60s with first degree AVB - Personally Reviewed  ECG    NSR 61bpm first degree AVB, nonspecfic TW changes, prior inferior infarct, no acute change from admission - Personally Reviewed  Physical Exam   No distress Elderly [place] male Lungs clear No murmur Right radial cath site A  Labs    High Sensitivity Troponin:   Recent Labs  Lab 02/04/21 0130 02/04/21 0620 02/04/21 1039 02/04/21 1259 02/05/21 0018  TROPONINIHS 283* 260*  212* 388* 410*      Cardiac EnzymesNo results for input(s): TROPONINI in the last 168 hours. No results for input(s): TROPIPOC in the last 168 hours.   Chemistry Recent Labs  Lab 02/01/21 1100 02/03/21 1412 02/05/21 0018 02/06/21 0202 02/07/21 0049  NA 141   < > 137 132* 134*  K 4.1   < > 3.6 3.9 3.9  CL 101   < > 101 98 102  CO2 26   < > 27 24 23   GLUCOSE 131*   < > 141* 179* 138*  BUN 17   < > 17 25* 19  CREATININE 1.20   < > 1.46* 1.52* 1.26*  CALCIUM 9.2   < > 7.7* 8.1* 8.4*  PROT 7.4  --   --  6.2*  --   ALBUMIN 4.0  --   --  2.6*  --   AST 17  --   --  28  --   ALT 10  --   --  28  --   ALKPHOS 189*  --   --  137*  --   BILITOT 0.7  --   --  0.8  --   GFRNONAA  --    < > 48* 46*  84*  ANIONGAP  --    < > 9 10 9    < > = values in this interval not displayed.     Hematology Recent Labs  Lab 02/05/21 0018 02/06/21 0202 02/07/21 0049  WBC 11.7* 11.7* 11.5*  RBC 4.36 4.22 4.37  HGB 11.8* 11.2* 11.6*  HCT 36.0* 35.3* 36.3*  MCV 82.6 83.6 83.1  MCH 27.1 26.5 26.5  MCHC 32.8 31.7 32.0  RDW 14.0 13.9 13.8  PLT 260 271 274    BNP Recent Labs  Lab 02/03/21 1643  BNP 530.8*     DDimer  Recent Labs  Lab 02/03/21 1642  DDIMER >20.00*     Radiology    CARDIAC CATHETERIZATION  Addendum Date: 02/06/2021     Ost LAD to Prox LAD lesion is 80% stenosed.   A drug-eluting stent was successfully placed using a STENT ONYX FRONTIER 3.5X15, postdailted to > 4 mm and optimized with IVUS.   Post intervention, there is a 0% residual stenosis.   1st Diag lesion is 90% stenosed.   Balloon angioplasty was performed through the stent struts to the jailed 1st diagonal using a BALLN SAPPHIRE 2.0X12.   Post intervention, there is a 40% residual stenosis.   Mid Cx lesion is 25% stenosed.   LV end diastolic pressure is normal.   There is no aortic valve stenosis.   From the right radial, it was difficult to not selectively engage the circumflex due to the left main being short.  EBU  3 guide catheter was used by anchoring the guide using circumflex wire, pulling back the guide, and then wiring the LAD. Complex, proximal LAD lesion.  Due to high risk location, this needed to be treated.  Successful IVUS guided stenting of the proximal LAD.  PTCA of the diagonal which was jailed.  Patient tolerated the procedure well. Resume heparin 6 hours after sheath removal.  Clopidogrel was used since he will likely be going home on Eliquis or Xarelto.  No aspirin to reduce bleeding risk.  Result Date: 02/06/2021   Ost LAD to Prox LAD lesion is 80% stenosed.   A drug-eluting stent was successfully placed using a STENT ONYX FRONTIER 3.5X15, postdailted to > 4 mm and optimized with IVUS.   Post intervention, there is a 0% residual stenosis.   1st Diag lesion is 90% stenosed.   Balloon angioplasty was performed through the stent struts to the jailed 1st diagonal using a BALLN SAPPHIRE 2.0X12.   Post intervention, there is a 40% residual stenosis.   Mid Cx lesion is 25% stenosed.   LV end diastolic pressure is normal.   There is no aortic valve stenosis.   From the right radial, it was difficult to not selectively engage the circumflex due to the left main being short.  EBU 3 guide catheter was used by anchoring the guide using circumflex wire, pulling back the guide, and then wiring the LAD. Complex, proximal LAD lesion.  Due to high risk location, this needed to be treated.  Successful IVUS guided stenting of the proximal LAD.  PTCA of the diagonal which was jailed.  Patient tolerated the procedure well. Resume heparin 6 hours after sheath removal.  Clopidogrel was used since he will likely be going home on Eliquis or Xarelto.  No aspirin to reduce bleeding risk.    Cardiac Studies   2D echo 02/04/21    1. Left ventricular ejection fraction, by estimation, is 50 to 55%. The  left ventricle has low normal  function. The left ventricle demonstrates  regional wall motion abnormalities (see scoring  diagram/findings for  description). There is moderate  asymmetric left ventricular hypertrophy of the basal-septal segment. Left  ventricular diastolic parameters are consistent with Grade II diastolic  dysfunction (pseudonormalization). There is the interventricular septum is  flattened in systole, consistent  with right ventricular pressure overload.   2. Right ventricular systolic function is mildly reduced. The right  ventricular size is normal. There is severely elevated pulmonary artery  systolic pressure. The estimated right ventricular systolic pressure is  36.1 mmHg.   3. Right atrial size was mildly dilated.   4. There is no evidence of cardiac tamponade.   5. The mitral valve is grossly normal. Mild mitral valve regurgitation.  No evidence of mitral stenosis.   6. The aortic valve was not well visualized. There is moderate  calcification of the aortic valve. There is moderate thickening of the  aortic valve. Aortic valve regurgitation is not visualized. Mild aortic  valve stenosis.   7. The inferior vena cava is normal in size with <50% respiratory  variability, suggesting right atrial pressure of 8 mmHg.   Comparison(s): Changes from prior study are noted.   Conclusion(s)/Recommendation(s): RV with mildly reduced function and  normal size. Severely elevated RVSP of 66 mmHg. There is a focal wall  motion abnormality in the basal to mid septum and the basal inferior wall.  Overall EF is low normal at 50-55%.   Patient Profile     81 y.o. male with hx of DVT/PE 2017, pulmonary nodules, HTN, DM, HLD, GERD who presented 1/7 with worsening dyspnea on exertion. Labs showed mildly elevated flat troponin in the 200s. CT angio showed  with PE within the distal right pulmonary artery, segmental and subsegmental right upper and middle lobe, negative for right heart strain, increased RLL nodule 56mm concerning for metastatic disease. While awaiting admission in the ED, got up to use the  bathroom then upon return complained of SOB/chest tightness and became unresponsive/pulseless. Received CPR, epi and obtained ROSC after 5 minutes. EKG done just prior to arrest showed WCT concerning for VT. Once regained pulse, he was awake and protecting his airway. Started on IV amiodarone with bolus. Cardiology called for further evaluation.   Assessment & Plan    1. Recurrent DVT/PE - anticoag per primary team/PCCM -> per notes they have considered full dose lytics but family wanted to avoid and would prefer treatment with just heparin; plan to reconsider if - start oral anticoagulation pending cath this am   2. Cardiac arrest with VT -> PEA -> asystole - cath yesterday showed ostial LAD stenosis post stenting -given need for anticoagulation only mono Rx with plavix - Long discussion about no interruption of plavix for at least 3 months If he needs bronch/lung biopsy will have to discuss with cardiology first   3. Pulmonary HTN - PA pressures very elevated in severe rate and RVF is mildly reduced- this plus VT is concerning - right heart cath today   4. Pulmonary nodules - CT on admission showed interim development of multiple pulmonary nodules measuring up to 18 mm in the right lower lobe suspicious for metastatic disease. Mediastinal and hilar adenopathy, also suspect for metastatic disease - Daughter and patient aware of diagnosis cancer   5. Essential HTN - norvasc held on atenolol   6. Hyperlipidemia - maintained on pravastatin 40mg  daily - LDL 71    For questions or updates, please contact [place] Please consult  [email or web address] for contact info under Cardiology/STEMI.  Signed, Jenkins Rouge, MD 02/07/2021, 8:44 AM

## 2021-02-07 NOTE — TOC Initial Note (Signed)
Transition of Care [facility]) - Initial/Assessment Note    Patient Details  Name: Arthur [name] MRN: [id number] Date of Birth: 1940-02-09  Transition of Care [facility]) CM/SW Contact:    Bethena Roys, RN Phone Number: 02/07/2021, 4:36 PM  Clinical Narrative:  Patient transferred from [place]. Patient is from home and plans to return home with home health services. Medicare.gov list provided to patient and son. Family chose [facility]-unable to service due to staffing. Case Manager called [phone]- the office can service the patient and start of care to begin within 24-48 hours post transition home. Patient in need of hospital bed and rolling walker with seat-both ordered from [employer]. DME Hospital bed should be delivered to the home on 02-08-21. No further needs identified at this time. Case Manager will continue to follow for additional needs.                  Expected Discharge Plan: [place] Barriers to Discharge: Continued Medical Work up   Patient Goals and CMS Choice Patient states their goals for this hospitalization and ongoing recovery are:: to return home CMS Medicare.gov Compare Post Acute Care list provided to:: Patient Choice offered to / list presented to : Patient, Adult Children  Expected Discharge Plan and Services Expected Discharge Plan: [place] In-house Referral: NA Discharge Planning Services: CM Consult Post Acute Care Choice: Home Health, Durable Medical Equipment Living arrangements for the past 2 months: [place]                 DME Arranged: Hospital bed, Walker rolling with seat DME Agency: [employer] Date DME Agency Contacted: 02/07/21 Time DME Agency Contacted: 1000 Representative spoke with at DME Agency: Freda Munro HH Arranged: PT, OT HH Agency: [place] Date [place]: 02/07/21 Time [place]: 75 Representative spoke with at [place]: [place]  Arrangements/Services Living arrangements for the past 2 months: [place] with:: Self, Relatives, Spouse Patient language and need for interpreter reviewed:: Yes Do you feel safe going back to the place where you live?: Yes      Need for Family Participation in Patient Care: Yes (Comment) Care giver support system in place?: Yes (comment)   Criminal Activity/Legal Involvement Pertinent to Current Situation/Hospitalization: No - Comment as needed  Activities of Daily Living Home Assistive Devices/Equipment: None ADL Screening (condition at time of admission) [name] cognitive ability adequate to safely complete daily activities?: Yes Is the patient deaf or have difficulty hearing?: No Does the patient have difficulty seeing, even when wearing glasses/contacts?: No Does the patient have difficulty concentrating, remembering, or making decisions?: No Patient able to express need for assistance with ADLs?: Yes Does the patient have difficulty dressing or bathing?: Yes Independently performs ADLs?: Yes (appropriate for developmental age) Does the patient have difficulty walking or climbing stairs?: No Weakness of Legs: None Weakness of Arms/Hands: None  Permission Sought/Granted Permission sought to share information with : Family Supports, Customer service manager, Case Optician, dispensing granted to share information with : Yes, Verbal Permission Granted     Permission granted to share info w AGENCY: [facility], [employer].        Emotional Assessment   Attitude/Demeanor/Rapport: Engaged Affect (typically observed): Appropriate Orientation: : Oriented to Situation, Oriented to  Time, Oriented to Place, Oriented to Self Alcohol / Substance Use: Not Applicable Psych Involvement: No (comment)  Admission diagnosis:  Pulmonary embolus ([place]) [I26.99] Multiple subsegmental  pulmonary emboli without acute cor pulmonale (HCC) [I26.94] Cardiac arrest [facility]) [I46.9] Patient  Active Problem List   Diagnosis Date Noted   Status post coronary artery stent placement    Coronary artery disease involving native heart    Cardiac arrest ([place]) 02/04/2021   Pulmonary nodules/lesions, multiple    Pulmonary embolus ([place]) 02/03/2021   Cerumen impaction 01/16/2021   BPPV (benign paroxysmal positional vertigo) 01/16/2021   Localized swelling of left foot 06/20/2020   Allergic rhinitis 06/06/2020   Right calf pain 02/13/2020   Trigger thumb 04/08/2018   Anemia 09/06/2015   History of pulmonary embolism    Recurrent cough 03/15/2014   GERD (gastroesophageal reflux disease) 07/05/2013   Arthritis 02/26/2010   Gout 11/06/2009   Diabetes mellitus, type II ([place]) 09/22/2009   Hyperlipidemia 09/22/2009   Essential hypertension, benign 09/22/2009   PCP:  Lind Covert, MD Pharmacy:   [facility], [address] [place] [address] Phone: [phone] Fax: [phone]  [facility] [place], [address] [place] [address] Phone: [phone] Fax: [phone]     Social Determinants of Health (SDOH) Interventions    Readmission Risk Interventions No flowsheet data found.

## 2021-02-07 NOTE — Care Management (Signed)
°  °  Durable Medical Equipment  (From admission, onward)           Start     Ordered   02/07/21 1452  For home use only DME 4 wheeled rolling walker with seat  Once       Question:  Patient needs a walker to treat with the following condition  Answer:  General weakness   02/07/21 1452   02/07/21 1444  For home use only DME Hospital bed  Once       Question Answer Comment  Length of Need 6 Months   Patient has (list medical condition): dyspnea   Bed type Semi-electric   Support Surface: Gel Overlay      02/07/21 1444   02/07/21 1353  For home use only DME Walker rolling  Once       Question Answer Comment  Walker: With [place]   Patient needs a walker to treat with the following condition Dyspnea      02/07/21 1412

## 2021-02-07 NOTE — Progress Notes (Signed)
ANTICOAGULATION CONSULT NOTE - Follow Up Consult  Pharmacy Consult for IV heparin Indication: pulmonary embolus  Allergies  Allergen Reactions   Ace Inhibitors     REACTION: Cough    Patient Measurements: Height: 5\' 11"  (180.3 cm) Weight: 73.3 kg (161 lb 9.6 oz) IBW/kg (Calculated) : 75.3 Heparin Dosing Weight: 75 kg  Vital Signs: Temp Source: Oral (01/10 2004) BP: 146/71 (01/10 2004) Pulse Rate: 68 (01/10 2004)  Labs: Recent Labs    02/04/21 0415 02/04/21 0620 02/04/21 1039 02/04/21 1103 02/04/21 1259 02/05/21 0018 02/06/21 0202 02/07/21 0049  HGB 12.3*  --   --   --   --  11.8* 11.2* 11.6*  HCT 38.6*  --   --   --   --  36.0* 35.3* 36.3*  PLT 280  --   --   --   --  260 271 274  HEPARINUNFRC 0.59  --   --    < >  --  0.52 0.37 0.38  CREATININE 1.33*  --   --   --   --  1.46* 1.52*  --   TROPONINIHS  --    < > 212*  --  388* 410*  --   --    < > = values in this interval not displayed.     Estimated Creatinine Clearance: 40.2 mL/min (A) (by C-G formula based on SCr of 1.52 mg/dL (H)).  Assessment: 81 yo male admitted 02/03/21 for shortness of breath. Patient has had a previous single episode of provoked PE and is no longer on anticoagulation. Pharmacy has now been consulted for heparin.  Heparin was resumed 6 hours after sheath removal on 1/10. Currently on IV heparin at 1200 units/hr. Heparin level is therapeutic. H/H low stable. Plt wnl. RN reports no s/s of bleeding   Goal of Therapy:  Heparin level 0.3-0.7 units/ml Monitor platelets by anticoagulation protocol: Yes   Plan:  Continue heparin 1200 units/hr  Check heparin level in ~8 hours Daily heparin level, CBC, s/sx of bleeding  Thank you for including pharmacy in the care of this patient.  Albertina Parr, PharmD., BCPS, BCCCP Clinical Pharmacist Please refer to [facility] for unit-specific pharmacist

## 2021-02-07 NOTE — Progress Notes (Signed)
Chaplain Melvenia Beam facilitated Forensic scientist signing with patient, Notary, and volunteer witnesses. Met patient and daughter in patient room. Patient was sitting up in chair. Confirmed with patient that he wanted to complete his A.D. at this time. Patient and daughter answered in the affirmative that this was his wish at this time. Patient and daughter stated that they both reviewed the document and understood its contents and that it accurately reflected patient's wishes.   Patient:  Terrie R. [facility], d.o.b: 1940/02/03    Patient named his two sons as his health care agentsHarvel Ricks [facility], [phone], and JESUA TAMBLYN [facility], [phone]  PART A: Patient reviewed, read aloud, and confirmed his identity and those agents listed above on the HCPOA.  PART B: Patient reviewed, read aloud, and confirmed by placing his initials his wishes regarding his Living Will.   In the presences of the Palliative Care Notary, Wynell Balloon, and two volunteer witnesses the patient printed his name, signed, and dated PART C of his Advance Directive.  The witnesses then printed and signed their names, and dated on Part C. Notary Wynell Balloon then printed and signed her name and affixed her official stamp upon the document.  I then made four copies of the document. Copy one was placed into the patient's hard chart at the nurse's station. The original and three copies were given to the patient: One copy for each of his agents and one copy for his daughter.  I then scanned a copy into the patient's electronic medical record.   Chaplain Morene Crocker is available for any follow up with the patient relating to this matter at [phone].

## 2021-02-07 NOTE — Evaluation (Signed)
Occupational Therapy Evaluation Patient Details Name: Arthur [name] MRN: [id number] DOB: 28-May-1940 Today's Date: 02/07/2021   History of Present Illness 81 y/o male admitted 02/03/21 with dyspnea, found to have a multiple PE,  lung nodules, lymphadenopathy. Started on heparin. 1/8  and after getting up to go to bathroom experienced cardiac arrest, received CPR, epi and obtained RoSC after 5 min,  PMH:  PE in 2017,  hypertension, diabetes, hyperlipidemia, gout, GERD   Clinical Impression   Pt independent with ADLs and mobility prior to admission. Pt currently min-guard - min A for ADLs, with increased assistance for LB ADLs as pt reports fatigue when reaching down. Per discussion with PT, pt unable to lay flat in bed due to SOB, requires HOB elevated. Pt SpO2 at 86-87% upon arrival after hallway ambulation with PT, however recovered quickly to mid 90's on RA. Pt min guard for transfers, with cues to remain close to Rollator. Educated pt and daughter about showering seated at d/c for safety and until activity tolerance improves, pt/daughter verbalized understanding. Pt presenting with impairments listed below, will continue to follow acutely. Recommend d/c home with [facility].     Recommendations for follow up therapy are one component of a multi-disciplinary discharge planning process, led by the attending physician.  Recommendations may be updated based on patient status, additional functional criteria and insurance authorization.   Follow Up Recommendations  Home health OT    Assistance Recommended at Discharge Set up Supervision/Assistance  Patient can return home with the following A little help with walking and/or transfers;A little help with bathing/dressing/bathroom;Assistance with cooking/housework;Help with stairs or ramp for entrance;Assist for transportation    Functional Status Assessment  Patient has had a recent decline in their functional status and demonstrates the ability to make  significant improvements in function in a reasonable and predictable amount of time.  Equipment Recommendations  Hospital bed;Other (comment) Agricultural consultant)    Recommendations for Other Services PT consult     Precautions / Restrictions Precautions Precautions: None Restrictions Weight Bearing Restrictions: No      Mobility Bed Mobility Overal bed mobility: Needs Assistance Bed Mobility: Supine to Sit     Supine to sit: Min assist;HOB elevated     General bed mobility comments: Handoff from PT, pt up in chair upon arrival    Transfers Overall transfer level: Needs assistance Equipment used: None Transfers: Sit to/from Stand Sit to Stand: Min guard           General transfer comment: cues needed to lock brakes on rollator prior to standing      Balance Overall balance assessment: Needs assistance Sitting-balance support: Feet supported;Feet unsupported;No upper extremity supported Sitting balance-Leahy Scale: Good     Standing balance support: During functional activity;Single extremity supported Standing balance-Leahy Scale: Poor Standing balance comment: can stand statically without support, however needs at least single UE support for dynamic balance                           ADL either performed or assessed with clinical judgement   ADL Overall ADL's : Needs assistance/impaired Eating/Feeding: Independent;Sitting   Grooming: Wash/dry hands;Wash/dry face;Sitting   Upper Body Bathing: Supervision/ safety;Sitting   Lower Body Bathing: Supervison/ safety;Sitting/lateral leans   Upper Body Dressing : Minimal assistance;Sitting Upper Body Dressing Details (indicate cue type and reason): min A to manage IV/telemetery Lower Body Dressing: Min guard;Sitting/lateral leans Lower Body Dressing Details (indicate cue type and reason): dons/doffs socks sitting  up in chair Toilet Transfer: Engineer, manufacturing (4 wheels);Regular Toilet;Ambulation    Toileting- Clothing Manipulation and Hygiene: Supervision/safety;Sitting/lateral lean       Functional mobility during ADLs: Min guard       Vision Baseline Vision/History: 1 Wears glasses Vision Assessment?: No apparent visual deficits     Perception     Praxis      Pertinent Vitals/Pain Pain Assessment: Faces Faces Pain Scale: Hurts little more Pain Location: chest with increased inspiration volume with ambulation at rest R upper back, chest from CPR, reports R sided back pain as well Pain Descriptors / Indicators: Aching;Sore Pain Intervention(s): Limited activity within patient's tolerance;Monitored during session;Repositioned;Relaxation     Hand Dominance     Extremity/Trunk Assessment Upper Extremity Assessment Upper Extremity Assessment: Generalized weakness   Lower Extremity Assessment Lower Extremity Assessment: Defer to PT evaluation   Cervical / Trunk Assessment Cervical / Trunk Assessment: Normal   Communication Communication Communication: HOH;Prefers language other than English (pt understands English well prefers for daughter to translate answers in [place])   Cognition Arousal/Alertness: Awake/alert Behavior During Therapy: WFL for tasks assessed/performed Overall Cognitive Status: Within Functional Limits for tasks assessed                                       General Comments  Pt SpO2 dropped to 86-87% upon entering room after ambulation hallway distnace with PT, quickly elevated back into mid 90's on RA    Exercises     Shoulder Instructions      Home Living Family/patient expects to be discharged to:: Private residence Living Arrangements: Spouse/significant other;Children Available Help at Discharge: Family;Available 24 hours/day Type of Home: House Home Access: Stairs to enter [employer] of Steps: 1   Home Layout: Able to live on main level with bedroom/bathroom;Two level     Bathroom Shower/Tub:  Occupational psychologist: Standard Bathroom Accessibility: No   Home Equipment: Shower seat;Hand held shower head          Prior Functioning/Environment Prior Level of Function : Driving;Independent/Modified Independent                        OT Problem List: Decreased strength;Decreased range of motion;Impaired balance (sitting and/or standing);Decreased activity tolerance;Cardiopulmonary status limiting activity      OT Treatment/Interventions: Self-care/ADL training;Therapeutic exercise;Energy conservation;DME and/or AE instruction;Patient/family education;Balance training;Cognitive remediation/compensation    OT Goals(Current goals can be found in the care plan section) Acute Rehab OT Goals Patient Stated Goal: none stated OT Goal Formulation: With patient Time For Goal Achievement: 02/21/21 Potential to Achieve Goals: Good ADL Goals Pt Will Perform Lower Body Dressing: with modified independence;sit to/from stand Pt Will Transfer to Toilet: with modified independence;ambulating;regular height toilet Pt Will Perform Tub/Shower Transfer: with modified independence;rolling walker;shower seat  OT Frequency: Min 2X/week    Co-evaluation              AM-PAC OT "6 Clicks" Daily Activity     Outcome Measure Help from another person eating meals?: None Help from another person taking care of personal grooming?: A Little Help from another person toileting, which includes using toliet, bedpan, or urinal?: A Little Help from another person bathing (including washing, rinsing, drying)?: A Little Help from another person to put on and taking off regular upper body clothing?: A Little Help from another person to put on and taking  off regular lower body clothing?: A Little 6 Click Score: 19   End of Session Equipment Utilized During Treatment: Gait belt;Rollator (4 wheels) Nurse Communication: Mobility status  Activity Tolerance: Patient tolerated treatment  well Patient left: in chair;with call bell/phone within reach;with chair alarm set;with family/visitor present  OT Visit Diagnosis: Unsteadiness on feet (R26.81);Other abnormalities of gait and mobility (R26.89);Muscle weakness (generalized) (M62.81)                Time: 4451-4604 OT Time Calculation (min): 27 min Charges:  OT General Charges $OT Visit: 1 Visit OT Evaluation $OT Eval Low Complexity: 1 Low OT Treatments $Self Care/Home Management : 8-22 mins  Lynnda Child, OTD, OTR/L Acute Rehab [phone]) 832 - [place] 02/07/2021, 10:42 AM

## 2021-02-07 NOTE — Progress Notes (Signed)
Family Medicine Teaching Service Daily Progress Note Intern Pager: [phone]  Patient name: Arthur [name] Medical record number: [id number] Date of birth: 1941/01/21 Age: 81 y.o. Gender: male  Primary Care Provider: Lind Covert, MD Consultants: Cardiology, CCM Code Status: Full  Pt Overview and Major Events to Date:  1/7 - Admission 1/8 -  Brief Cardiac Arrest 1/10 - Left Heart Cath   Assessment and Plan:   81 y/o male admitted 1/7 with dyspnea, found to have a multiple PE, hx of PE in 2017.  Initially treated with heparin, then had a cardiac arrest and ICU admission, now admitted for management of his multiple PEs.   Cardiac arrest with VT -> PEA -> asystole Patient had left heart cath with stent placement of LAD and balloon angioplasty of other vessels. Patient tolerated the procedure well and has no complaints today. -Telemetry -Cardiology following, appreciate recs -Supplemental O2 as needed  Recurrent DVT/PE   PE in 2017   Pulm HTN Heparin in range today .38, range 0.3-0.7. Will switch to apixiban today -d/c Heparin -Start apixiban 5 mg BID -Consider outpatient echo s/p anticoagulation  CKD IIIa (improving) GFR 58 today, improved from 46 yesterday. Cr. 1.26, from 1.52 yesterday. -Continue to monitor -Hold HCTZ and Losartan  Hyponatremia (improving) Na 134 today, from 132 yesterday.  HTN BP range 113-147/56-74, mostly normotensive with some episodes of mild HTN -Hold Losartan and HCTZ  All other conditions chronic and stable Pulmonary nodules  Leukocytosis DM2 Hyperlipidemia   FEN/GI: Regular PPx: Systemic Heparin?? Dispo:Home with home health  tomorrow.   Subjective:  Patient with no complaints, although notes that he feels a little short of breath when standing in walking. Notes that he get's tired quickly, but the sensation doesn't last long.  Objective: Temp:  [97.9 F (36.6 C)] 97.9 F (36.6 C) (01/10 1127) Pulse Rate:  [59-275] 68  (01/10 2004) Resp:  [0-24] 17 (01/10 2004) BP: (111-154)/(40-74) 146/71 (01/10 2004) SpO2:  [92 %-100 %] 94 % (01/10 2004) Physical Exam: General: Well appearing, good mood, NAD, sitting comfortably in bed, male Cardiovascular: RRR, [place] Respiratory: CTABL Abdomen: Soft, NTTP, non-distended Extremities: Moving all extremities independently.   Laboratory: Recent Labs  Lab 02/05/21 0018 02/06/21 0202 02/07/21 0049  WBC 11.7* 11.7* 11.5*  HGB 11.8* 11.2* 11.6*  HCT 36.0* 35.3* 36.3*  PLT 260 271 274   Recent Labs  Lab 02/01/21 1100 02/03/21 1412 02/05/21 0018 02/06/21 0202 02/07/21 0049  NA 141   < > 137 132* 134*  K 4.1   < > 3.6 3.9 3.9  CL 101   < > 101 98 102  CO2 26   < > 27 24 23   BUN 17   < > 17 25* 19  CREATININE 1.20   < > 1.46* 1.52* 1.26*  CALCIUM 9.2   < > 7.7* 8.1* 8.4*  PROT 7.4  --   --  6.2*  --   BILITOT 0.7  --   --  0.8  --   ALKPHOS 189*  --   --  137*  --   ALT 10  --   --  28  --   AST 17  --   --  28  --   GLUCOSE 131*   < > 141* 179* 138*   < > = values in this interval not displayed.      Imaging/Diagnostic Tests:   Holley Bouche, MD 02/07/2021, 5:31 AM PGY-1, [place] Intern pager: [phone],  text pages welcome

## 2021-02-07 NOTE — Progress Notes (Signed)
CARDIAC REHAB PHASE I   Stent education completed with pt and daughter. Pt educated on importance of Plavix. Pt given heart healthy and diabetic diets. Reviewed site care, restrictions, and exercise guidelines. Pt states his doctor had stopped ASA in the past due to gastric issues. Pt educated on signs and symptoms of bleeding. Stressed importance of safety. Will refer to CRP II [place]. Pt getting DME at PT's recommendation.  [id number] Rufina Falco, RN BSN 02/07/2021 11:52 AM

## 2021-02-07 NOTE — Evaluation (Signed)
Physical Therapy Evaluation Patient Details Name: Arthur [name] MRN: [id number] DOB: 02/28/1940 Today's Date: 02/07/2021  History of Present Illness  81 y/o male admitted 02/03/21 with dyspnea, found to have a multiple PE,  lung nodules, lymphadenopathy. Started on heparin. 1/8  and after getting up to go to bathroom experienced cardiac arrest, received CPR, epi and obtained RoSC after 5 min,  PMH:  PE in 2017,  hypertension, diabetes, hyperlipidemia, gout, GERD  Clinical Impression  PTA pt living with wife, adult son and daughter in law in multistory home with bed and bath down one step. Daughter in room and assist with home set up and PLOF secondary to pt understanding English however easier for daughter to translate answers. Pt reports independence with mobility and iADLs, and driving prior to hospitalization. Pt is currently limited in safe mobility by SoB with activity (likely due to increased pain with deep inspiration after CPR) as well as generalized weakness and associated decreased balance and endurance. Pt is min A for bed mobility, min guard for transfers and min guard for ambulation of 200 feet with Rollator. PT recommending short bout of HHPT at discharge to regain PLOF. PT will continue to follow acutely.      Recommendations for follow up therapy are one component of a multi-disciplinary discharge planning process, led by the attending physician.  Recommendations may be updated based on patient status, additional functional criteria and insurance authorization.  Follow Up Recommendations Home health PT    Assistance Recommended at Discharge Frequent or constant Supervision/Assistance  Patient can return home with the following  A little help with walking and/or transfers;A little help with bathing/dressing/bathroom;Assistance with cooking/housework;Assist for transportation;Help with stairs or ramp for entrance    Equipment Recommendations Rollator (4 wheels);Hospital bed      Functional Status Assessment Patient has had a recent decline in their functional status and demonstrates the ability to make significant improvements in function in a reasonable and predictable amount of time.     Precautions / Restrictions Precautions Precautions: None Restrictions Weight Bearing Restrictions: No      Mobility  Bed Mobility Overal bed mobility: Needs Assistance Bed Mobility: Supine to Sit     Supine to sit: Min assist;HOB elevated     General bed mobility comments: pt unable to tolerate flattening of bed due to SoB and R back pain, with HoB elevated pt able to manage LE off bed and bring trunk to upright, despite maximal effort pt requires min A for scooting hips to EoB    Transfers Overall transfer level: Needs assistance Equipment used: None Transfers: Sit to/from Stand Sit to Stand: Min guard           General transfer comment: min guard for safety, good power up and self steadying, prefers UE support for dynamic balance    Ambulation/Gait Ambulation/Gait assistance: Min assist;Min guard Gait Distance (Feet): 200 Feet Assistive device: Rollator (4 wheels) Gait Pattern/deviations: Step-through pattern;Decreased step length - right;Decreased step length - left Gait velocity: slowed Gait velocity interpretation: <1.31 ft/sec, indicative of household ambulator   General Gait Details: min A for steadying especially in tight spaces like bathroom, able to progress to min guard for ambulation in hallway      Balance Overall balance assessment: Needs assistance Sitting-balance support: Feet supported;Feet unsupported;No upper extremity supported Sitting balance-Leahy Scale: Good     Standing balance support: During functional activity;Single extremity supported Standing balance-Leahy Scale: Poor Standing balance comment: can stand statically without support, however needs at least  single UE support for dynamic balance                              Pertinent Vitals/Pain Pain Assessment: Faces Faces Pain Scale: Hurts little more Pain Location: chest with increased inspiration volume with ambulation at rest R upper back, chest from CPR Pain Descriptors / Indicators: Aching;Sore Pain Intervention(s): Limited activity within patient's tolerance;Monitored during session;Repositioned    Home Living Family/patient expects to be discharged to:: Private residence Living Arrangements: Spouse/significant other;Children Available Help at Discharge: Family;Available 24 hours/day Type of Home: House Home Access: Stairs to enter   [employer] of Steps: 1   Home Layout: Able to live on main level with bedroom/bathroom;Two level Home Equipment: Shower seat;Hand held shower head      Prior Function Prior Level of Function : Driving;Independent/Modified Independent                        Extremity/Trunk Assessment   Upper Extremity Assessment Upper Extremity Assessment: Defer to OT evaluation    Lower Extremity Assessment Lower Extremity Assessment: Overall WFL for tasks assessed       Communication   Communication: HOH;Prefers language other than [place] (pt understands English well prefers for daughter to translate answers in [place])  Cognition Arousal/Alertness: Awake/alert Behavior During Therapy: WFL for tasks assessed/performed Overall Cognitive Status: Within Functional Limits for tasks assessed                                          General Comments General comments (skin integrity, edema, etc.): at rest BP 147/90, HR 73 bpm, SpO2 on RA 96%O2, with ambulation HR in low 80sbpm, SpO2 >90%O2, with return to seated SpO2 dropped momentarily to 87%O2 but quickly rebounded to 90%O2 with cues for deeper breathing,        Assessment/Plan    PT Assessment Patient needs continued PT services  PT Problem List Decreased strength;Decreased activity tolerance;Decreased  balance;Decreased mobility;Decreased range of motion;Cardiopulmonary status limiting activity       PT Treatment Interventions DME instruction;Gait training;Stair training;Functional mobility training;Therapeutic activities;Therapeutic exercise;Balance training;Cognitive remediation;Patient/family education    PT Goals (Current goals can be found in the Care Plan section)  Acute Rehab PT Goals Patient Stated Goal: get back to wife PT Goal Formulation: With patient/family Time For Goal Achievement: 02/21/21 Potential to Achieve Goals: Fair    Frequency Min 3X/week        AM-PAC PT "6 Clicks" Mobility  Outcome Measure Help needed turning from your back to your side while in a flat bed without using bedrails?: None Help needed moving from lying on your back to sitting on the side of a flat bed without using bedrails?: A Little Help needed moving to and from a bed to a chair (including a wheelchair)?: A Little Help needed standing up from a chair using your arms (e.g., wheelchair or bedside chair)?: None Help needed to walk in hospital room?: A Little Help needed climbing 3-5 steps with a railing? : A Little 6 Click Score: 20    End of Session Equipment Utilized During Treatment: Gait belt Activity Tolerance: Patient tolerated treatment well Patient left: in chair;with family/visitor present;Other (comment) (OT in room for assessment) Nurse Communication: Mobility status;Other (comment) (no need for supplemental O2 at d/c) PT Visit Diagnosis: Unsteadiness on feet (R26.81);Other abnormalities  of gait and mobility (R26.89);Muscle weakness (generalized) (M62.81);Difficulty in walking, not elsewhere classified (R26.2);Pain Pain - part of body:  (chest with increased inspiration volume)    Time: 6144-3154 PT Time Calculation (min) (ACUTE ONLY): 26 min   Charges:   PT Evaluation $PT Eval Moderate Complexity: 1 Mod PT Treatments $Gait Training: 8-22 mins        Remonia Otte B. Migdalia Dk PT, DPT Acute Rehabilitation Services Pager [phone] Office [phone]   [place] 02/07/2021, 10:17 AM

## 2021-02-07 NOTE — Progress Notes (Signed)
ANTICOAGULATION CONSULT NOTE - Follow Up Consult  Pharmacy Consult for transition from IV heparin to apixaban Indication: pulmonary embolus  Allergies  Allergen Reactions   Ace Inhibitors     REACTION: Cough    Patient Measurements: Height: 5\' 11"  (180.3 cm) Weight: 73.3 kg (161 lb 9.6 oz) IBW/kg (Calculated) : 75.3  Vital Signs: Temp: 98 F (36.7 C) (01/11 0826) Temp Source: Oral (01/11 0826) BP: 142/56 (01/11 0826) Pulse Rate: 73 (01/11 0826)  Labs: Recent Labs    02/04/21 1259 02/05/21 0018 02/05/21 0018 02/06/21 0202 02/07/21 0049 02/07/21 0905  HGB  --  11.8*   < > 11.2* 11.6*  --   HCT  --  36.0*  --  35.3* 36.3*  --   PLT  --  260  --  271 274  --   HEPARINUNFRC  --  0.52   < > 0.37 0.38 0.46  CREATININE  --  1.46*  --  1.52* 1.26*  --   TROPONINIHS 388* 410*  --   --   --   --    < > = values in this interval not displayed.    Estimated Creatinine Clearance: 48.5 mL/min (A) (by C-G formula based on SCr of 1.26 mg/dL (H)).  Assessment: 81 yo male admitted 02/03/21 for shortness of breath. Patient has had a previous single episode of provoked PE and is no longer on anticoagulation. Pharmacy has now been consulted for heparin. Heparin was initially running at 1200 units/hr and patient was therapeutic. The drip was stopped 1/10 prior to cath procedure. Heparin was resumed at prior therapeutic rate 6 hours after sheath removal. Patient was therapeutic with the most recent level at 0.46. Pharmacy has now been consulted to transition to apixaban.   Per cardiology, patient will also be started on clopidogrel. Aspirin has been discontinued. Due to the patient's age and recent aspirin use, we will not load with apixaban and instead will start at the maintenance dose of 5 mg BID.  Goal of Therapy:  Monitor platelets by anticoagulation protocol: Yes   Plan:  Discontinue heparin Discontinue aspirin Start clopidogrel Start apixaban 5 mg BID  Thank you for including  pharmacy in the care of this patient.  Zenaida Deed, PharmD PGY1 Acute Care Pharmacy Resident  Phone: [phone] 02/07/2021  3:38 PM  Please check [email or web address] for unit-specific pharmacy phone numbers.

## 2021-02-08 ENCOUNTER — Telehealth: Payer: Self-pay

## 2021-02-08 DIAGNOSIS — I2699 Other pulmonary embolism without acute cor pulmonale: Secondary | ICD-10-CM | POA: Diagnosis not present

## 2021-02-08 DIAGNOSIS — R918 Other nonspecific abnormal finding of lung field: Secondary | ICD-10-CM

## 2021-02-08 DIAGNOSIS — Z955 Presence of coronary angioplasty implant and graft: Secondary | ICD-10-CM | POA: Diagnosis not present

## 2021-02-08 DIAGNOSIS — I469 Cardiac arrest, cause unspecified: Secondary | ICD-10-CM | POA: Diagnosis not present

## 2021-02-08 DIAGNOSIS — I2510 Atherosclerotic heart disease of native coronary artery without angina pectoris: Secondary | ICD-10-CM | POA: Diagnosis not present

## 2021-02-08 LAB — BASIC METABOLIC PANEL
Anion gap: 10 (ref 5–15)
BUN: 19 mg/dL (ref 8–23)
CO2: 25 mmol/L (ref 22–32)
Calcium: 8.9 mg/dL (ref 8.9–10.3)
Chloride: 100 mmol/L (ref 98–111)
Creatinine, Ser: 1.22 mg/dL (ref 0.61–1.24)
GFR, Estimated: 60 mL/min — ABNORMAL LOW (ref >60)
Glucose, Bld: 133 mg/dL — ABNORMAL HIGH (ref 70–99)
Potassium: 3.9 mmol/L (ref 3.5–5.1)
Sodium: 135 mmol/L (ref 135–145)

## 2021-02-08 LAB — CBC
HCT: 35.3 % — ABNORMAL LOW (ref 39.0–52.0)
Hemoglobin: 11.5 g/dL — ABNORMAL LOW (ref 13.0–17.0)
MCH: 27.1 pg (ref 26.0–34.0)
MCHC: 32.6 g/dL (ref 30.0–36.0)
MCV: 83.1 fL (ref 80.0–100.0)
Platelets: 320 10*3/uL (ref 150–400)
RBC: 4.25 MIL/uL (ref 4.22–5.81)
RDW: 13.9 % (ref 11.5–15.5)
WBC: 11.7 10*3/uL — ABNORMAL HIGH (ref 4.0–10.5)
nRBC: 0 % (ref 0.0–0.2)

## 2021-02-08 LAB — GLUCOSE, CAPILLARY
Glucose-Capillary: 142 mg/dL — ABNORMAL HIGH (ref 70–99)
Glucose-Capillary: 178 mg/dL — ABNORMAL HIGH (ref 70–99)

## 2021-02-08 LAB — MAGNESIUM: Magnesium: 1.8 mg/dL (ref 1.7–2.4)

## 2021-02-08 MED ORDER — ALBUTEROL SULFATE HFA 108 (90 BASE) MCG/ACT IN AERS: 2.0000 | INHALATION_SPRAY | RESPIRATORY_TRACT | 0 refills | Status: DC | PRN

## 2021-02-08 MED ORDER — APIXABAN 5 MG PO TABS: 5.0000 mg | ORAL_TABLET | Freq: Two times a day (BID) | ORAL | 0 refills | Status: DC

## 2021-02-08 MED ORDER — CLOPIDOGREL BISULFATE 75 MG PO TABS: 75.0000 mg | ORAL_TABLET | Freq: Every day | ORAL | 0 refills | Status: DC

## 2021-02-08 MED ORDER — BENZONATATE 100 MG PO CAPS
100.0000 mg | ORAL_CAPSULE | Freq: Three times a day (TID) | ORAL | Status: DC | PRN
  Administered 2021-02-08 (×3): 100 mg via ORAL
  Filled 2021-02-08 (×3): qty 1

## 2021-02-08 MED ORDER — ROSUVASTATIN CALCIUM 20 MG PO TABS: 20.0000 mg | ORAL_TABLET | Freq: Every day | ORAL | 0 refills | Status: DC

## 2021-02-08 NOTE — TOC Transition Note (Signed)
Transition of Care [facility]) - CM/SW Discharge Note   Patient Details  Name: MADELINE BEBOUT MRN: [id number] Date of Birth: 05/20/1940  [facility] CM/SW Contact:  Bethena Roys, RN Phone Number: 02/08/2021, 1:24 PM   Clinical Narrative:  Case Manager notified MD on yesterday that the Agency will be delayed until Monday to see the patient. Case Manager also spoke with the patients son and daughter to make them aware of the delay in service. Family was fine with the start day. No further needs from Case Manager at this time.     Final next level of care: [place] Barriers to Discharge: Continued Medical Work up   Patient Goals and CMS Choice Patient states their goals for this hospitalization and ongoing recovery are:: to return home CMS Medicare.gov Compare Post Acute Care list provided to:: Patient Choice offered to / list presented to : Patient, Adult Children   Discharge Plan and Services In-house Referral: NA Discharge Planning Services: CM Consult Post Acute Care Choice: Home Health, Durable Medical Equipment          DME Arranged: Hospital bed, Walker rolling with seat DME Agency: [employer] Date DME Agency Contacted: 02/07/21 Time DME Agency Contacted: 1000 Representative spoke with at DME Agency: Freda Munro HH Arranged: PT, OT [facility] Agency: [place] Date [place]: 02/07/21 Time [place]: 36 Representative spoke with at [place]: Amy     Readmission Risk Interventions No flowsheet data found.

## 2021-02-08 NOTE — Care Management Important Message (Signed)
Important Message  Patient Details  Name: Arthur [name] MRN: [id number] Date of Birth: 03/12/1940   Medicare Important Message Given:  Yes     [name] 02/08/2021, 8:13 AM

## 2021-02-08 NOTE — Discharge Summary (Signed)
[facility] Discharge Summary  Patient name: Arthur [name] Medical record number: [id number] Date of birth: 09-Mar-1940 Age: 81 y.o. Gender: male Date of Admission: 02/03/2021  Date of Discharge: 02/08/21 Admitting Physician: Marshell Garfinkel, MD  Primary Care Provider: Lind Covert, MD Consultants: Cardiology, Pulmonology, CCM  Indication for Hospitalization: Dyspnea  Discharge Diagnoses/Problem List:  Cardiac arrest with VT   PEA  Multiple PE   Recurrent DVT/PE   Pulm HTN Pulmonary nodules   Unintentional weight loss CKD IIIa  HTN Anemia DM2 HLD  Disposition: Home  Discharge Condition: Improved  Discharge Exam:  Temp:  [97.6 F (36.4 C)-98.2 F (36.8 C)] 97.6 F (36.4 C) (01/12 0500) Pulse Rate:  [61-73] 70 (01/12 0500) Resp:  [18-20] 20 (01/12 0500) BP: (135-157)/(56-69) 157/63 (01/12 0500) SpO2:  [92 %-97 %] 96 % (01/12 0500) Physical Exam: General: Sleeping, comfortable, NAD, male Cardiovascular: RRR, [place] Respiratory: CTABL Abdomen: Soft, NTTP, non-distended Extremities: Moving all extremities independently  Brief Hospital Course:  Arthur [name] is a 81 y.o. male presenting for worsening shortness of breath, found to have multiple pulmonary emboli. PMH significant for HTN, T2DM, HLD, gout, GERD, Hx of pulmonary embolus and DVT.  Cardiac arrest   CAD On 1/8, while awaiting admission in the ED, patient got up to use the bathroom and then upon return, complained of SOB/chest tightness. He became unresponsive/pulseless, and received CPR, and epi. ROSC was obtained after 5 minutes. EKG done just prior to arrest showed WCT concerning for VT. Once regained pulse, he was awake and protecting his airway. Started on IV amiodarone with bolus. CCM and cardiology were consulted and patient went to ICU. Troponin's were elevated at 262>283,  but trended down by discharge. TTE showed LVEF 50-55%, RVSP 66 mmHg, severely elevated PA pressure,  RV systolic function mildly reduced, RA mild dilated. Patient had LHC performed showing multi-vessel disease, with 80% stenosis of mid LAD lesion. PCI with DES to LAD; balloon angioplasty 1st diagonal Patient was later started on plavix, to continue for 3 months.    Multiple PE Patient presented with 2 days of increased exertional SOB. Per EMS, patient was hypoxic with ambulation. In the ER, CXR showed some increased interstitial markings. CTA PE showed acute emboli within the distal right pulmonary artery and segmental right upper and middle lobe pulmonary vessels without right heart strain. Patient was offered TPA, but family elected to use heparin. Patient was started on heparin GTT, in preparation for heart cath procedure, and was later switched to [place]. By discharge patient was stable sating well on RA.   Pulmonary HTN Patient had very elevated PA pressures on echo, with RVF mildly reduced  Pulmonary nodules   Unintentional weight loss CTA PE imaging was notable for multiple pulmonary nodules measuring up to 18 mm in the RUL, RLL, and LLL, with mediastinal and hilar adenopathy, concerning for metastatic disease.  Notable history of unintentional weight loss and known iron deficiency anemia.   All other chronic conditions were stable and treated with home medications as appropriate.   Issues for follow-up: Recommend outpatient colonoscopy. Lifelong anticoagulation for PE hx Repeat Echo for PA pressures and RVF, s/p anticoagulation PET CT outpatient for pulm nodules Consider restarting HCTZ and Losartan    Significant Procedures:  Left Heart Cath   Significant Labs and Imaging:  Recent Labs  Lab 02/07/21 0049 02/07/21 2211 02/08/21 0553  WBC 11.5* 11.8* 11.7*  HGB 11.6* 11.8* 11.5*  HCT 36.3* 36.3* 35.3*  PLT 274  318 320   Recent Labs  Lab 02/06/21 0202 02/07/21 0049 02/07/21 2211 02/08/21 0553  NA 132* 134* 132* 135  K 3.9 3.9 4.2 3.9  CL 98 102 101 100  CO2 24  23 23 25   GLUCOSE 179* 138* 151* 133*  BUN 25* 19 20 19   CREATININE 1.52* 1.26* 1.08 1.22  CALCIUM 8.1* 8.4* 8.5* 8.9  MG 2.2 2.1  --  1.8  ALKPHOS 137*  --   --   --   AST 28  --   --   --   ALT 28  --   --   --   ALBUMIN 2.6*  --   --   --       Results/Tests Pending at Time of Discharge: None  Discharge Medications:  Allergies as of 02/08/2021       Reactions   Ace Inhibitors    REACTION: Cough        Medication List     STOP taking these medications    allopurinol 100 MG tablet Commonly known as: ZYLOPRIM   amLODipine 5 MG tablet Commonly known as: NORVASC   celecoxib 200 MG capsule Commonly known as: CELEBREX   colchicine 0.6 MG tablet   hydrochlorothiazide 12.5 MG tablet Commonly known as: HYDRODIURIL   losartan 100 MG tablet Commonly known as: COZAAR   pravastatin 40 MG tablet Commonly known as: PRAVACHOL       TAKE these medications    acetaminophen 650 MG CR tablet Commonly known as: TYLENOL Take 650 mg by mouth every 8 (eight) hours as needed for pain.   albuterol 108 (90 Base) MCG/ACT inhaler Commonly known as: ProAir HFA Inhale 2 puffs into the lungs every 4 (four) hours as needed for wheezing or shortness of breath.   apixaban 5 MG Tabs tablet Commonly known as: ELIQUIS Take 1 tablet (5 mg total) by mouth 2 (two) times daily.   atenolol 50 MG tablet Commonly known as: TENORMIN Take 1 tablet (50 mg total) by mouth 2 (two) times daily.   benzonatate 100 MG capsule Commonly known as: TESSALON TAKE 1 CAPSULE BY MOUTH TWICE DAILY AS NEEDED FOR COUGH What changed: See the new instructions.   Centrum Silver 50+Men Tabs Take 1 tablet by mouth daily.   clopidogrel 75 MG tablet Commonly known as: PLAVIX Take 1 tablet (75 mg total) by mouth daily with breakfast.   diclofenac Sodium 1 % Gel Commonly known as: Voltaren Apply 2 g topically 4 (four) times daily. What changed:  how much to take when to take this reasons to take  this   ferrous sulfate 324 (65 Fe) MG Tbec Take 324 mg by mouth daily.   loratadine 10 MG tablet Commonly known as: CLARITIN Take 1 tablet (10 mg total) by mouth daily. What changed:  when to take this reasons to take this   metFORMIN 1000 MG tablet Commonly known as: GLUCOPHAGE Take 1 tablet (1,000 mg total) by mouth 2 (two) times daily with a meal.   omeprazole 20 MG capsule Commonly known as: PRILOSEC TAKE 1 CAPSULE BY MOUTH ONCE DAILY AS NEEDED What changed: reasons to take this   rosuvastatin 20 MG tablet Commonly known as: CRESTOR Take 1 tablet (20 mg total) by mouth daily.   trolamine salicylate 10 % cream Commonly known as: Asper-Flex Apply 1 application topically as needed for muscle pain.        Discharge Instructions: Please refer to Patient Instructions section of EMR for full details.  Patient was counseled important signs and symptoms that should prompt return to medical care, changes in medications, dietary instructions, activity restrictions, and follow up appointments.   Follow-Up Appointments:  Follow-up Information     [facility] Follow up.   Why: Hospital Bed and rolling walker. Contact information: [place] [address] [phone]         [facility] Follow up.   Specialty: Home Health Services Why: Now known as [facility]. Home Health Physical Therapy and Occupational Therapy-office to call with a visit time. Contact information: [place] [address] [phone]                 Holley Bouche, MD 02/12/2021, 12:32 AM PGY-1, [place]

## 2021-02-08 NOTE — Discharge Instructions (Addendum)
Dear Armandina Gemma,  Thank you for letting us participate in your care.   POST-HOSPITAL & CARE INSTRUCTIONS Follow up with Pulmonology and Cardiology Go to your follow up appointments (listed below)   DOCTOR'S APPOINTMENT   Future Appointments  Date Time Provider [place]  03/27/2021 10:10 AM Lind Covert, MD [facility] Follow up.   Why: Hospital Bed and rolling walker. Contact information: [place] [address] [phone]         [facility] Follow up.   Specialty: Home Health Services Why: Now known as [facility]. Home Health Physical Therapy and Occupational Therapy-office to call with a visit time. Contact information: [place] [address] [phone]                 Take care and be well!  [facility]  [place], [place] [address] [phone]     Information on my medicine - ELIQUIS (apixaban)  This medication education was reviewed with me or my healthcare representative as part of my discharge preparation.    Why was Eliquis prescribed for you? Eliquis was prescribed to treat blood clots that may have been found in the veins of your legs (deep vein thrombosis) or in your lungs (pulmonary embolism) and to reduce the risk of them occurring again.  What do You need to know about Eliquis ? The dose is ONE 5 mg tablet taken TWICE daily. Eliquis may be taken with or without food.   Try to take the dose about the same time in the morning and in the evening. If you have difficulty swallowing the tablet whole please discuss with your pharmacist how to take the medication safely.  Take Eliquis exactly as prescribed and DO NOT stop taking Eliquis without talking to the doctor who prescribed the medication.  Stopping may increase your risk  of developing a new blood clot.  Refill your prescription before you run out.  After discharge, you should have regular check-up appointments with your healthcare provider that is prescribing your Eliquis.    What do you do if you miss a dose? If a dose of ELIQUIS is not taken at the scheduled time, take it as soon as possible on the same day and twice-daily administration should be resumed. The dose should not be doubled to make up for a missed dose.  Important Safety Information A possible side effect of Eliquis is bleeding. You should call your healthcare provider right away if you experience any of the following: Bleeding from an injury or your nose that does not stop. Unusual colored urine (red or dark brown) or unusual colored stools (red or black). Unusual bruising for unknown reasons. A serious fall or if you hit your head (even if there is no bleeding).  Some medicines may interact with Eliquis and might increase your risk of bleeding or clotting while on Eliquis. To help avoid this, consult your healthcare provider or pharmacist prior to using any new prescription or non-prescription medications, including herbals, vitamins, non-steroidal anti-inflammatory drugs (NSAIDs) and supplements.  This website has more information on Eliquis (apixaban): http://www.eliquis.com/eliquis/home

## 2021-02-08 NOTE — Telephone Encounter (Signed)
-----   Message from Juanito Doom, MD sent at 02/08/2021  3:41 PM EST ----- Hi,  Can we make arrangements for outpatient follow up in 2-4 weeks for a pulmonary nodule and PE? He needs to have an outpatient PET CT performed after hospital discharge (today) and before his first clinic visit with [place] for the nodule.  F/u with either Drs. Byrum or Icard or APP.  Thanks, Ruby Cola

## 2021-02-08 NOTE — Progress Notes (Signed)
Family Medicine Teaching Service Daily Progress Note Intern Pager: [phone]  Patient name: Arthur [name] Medical record number: [id number] Date of birth: 12-09-1940 Age: 81 y.o. Gender: male  Primary Care Provider: Lind Covert, MD Consultants: Cardiology, CCM Code Status: Full  Pt Overview and Major Events to Date:  1/7 - Admission 1/8 -  Brief Cardiac Arrest 1/10 - Left Heart Cath   Assessment and Plan:   81 y/o male admitted 1/7 with dyspnea, found to have a multiple PE, hx of PE in 2017.  Initially treated with heparin, then had a cardiac arrest and ICU admission, now admitted for management of his multiple PEs.   Cardiac arrest with VT -> PEA -> asystole (resolved) Patient s/p LHC and doing well. Didn't sleep well last night, 2/2 coughing. Patient was coughing up blood tinged sputum for most of the night. Started on plavix, to be on for 3 months. Patient, chaplain, and daughter completed patient's advance directive signing yesterday.  -Continue plavix -Continue telemetry -Cardiology following, appreciate recs -Supplemental O2 as needed  Recurrent DVT/PE   PE in 2017   Pulm HTN Patient started on [place] and doing well -Continue Apixiban -Consider outpatient echo  Pulmonary nodules  May consider getting imaging or tissue biopsy, while patient is admitted -PET CT outpatient -Pulm consult  CKD IIIa (stable) GFR 60, stable from yesterday > 60. Cr 1.22, worse from yesterday 1.08 -Hold Losartan and HCTZ -Continue to monitor  Hyponatremia (stable) Na 135 today, from 134 yesterday.  -Continue to monitor  HTN BP stable, range 130's-150's/60's -Hold Losartan and HCTZ   FEN/GI: Regular PPx: Apixiban Dispo:Home with home health  today.   Subjective:  Patient had coughing episode last night, with slight blood tinged sputum. Patient also had right sided pain that responded to tylenol.  Objective: Temp:  [97.6 F (36.4 C)-98.2 F (36.8 C)] 97.6 F (36.4  C) (01/12 0500) Pulse Rate:  [61-73] 70 (01/12 0500) Resp:  [18-20] 20 (01/12 0500) BP: (135-157)/(56-69) 157/63 (01/12 0500) SpO2:  [92 %-97 %] 96 % (01/12 0500) Physical Exam: General: Sleeping, comfortable, NAD, male Cardiovascular: RRR, [facility] Respiratory: CTABL Abdomen: Soft, NTTP, non-distended Extremities: Moving all extremities independently  Laboratory: Recent Labs  Lab 02/06/21 0202 02/07/21 0049 02/07/21 2211  WBC 11.7* 11.5* 11.8*  HGB 11.2* 11.6* 11.8*  HCT 35.3* 36.3* 36.3*  PLT 271 274 318   Recent Labs  Lab 02/01/21 1100 02/03/21 1412 02/06/21 0202 02/07/21 0049 02/07/21 2211  NA 141   < > 132* 134* 132*  K 4.1   < > 3.9 3.9 4.2  CL 101   < > 98 102 101  CO2 26   < > 24 23 23   BUN 17   < > 25* 19 20  CREATININE 1.20   < > 1.52* 1.26* 1.08  CALCIUM 9.2   < > 8.1* 8.4* 8.5*  PROT 7.4  --  6.2*  --   --   BILITOT 0.7  --  0.8  --   --   ALKPHOS 189*  --  137*  --   --   ALT 10  --  28  --   --   AST 17  --  28  --   --   GLUCOSE 131*   < > 179* 138* 151*   < > = values in this interval not displayed.      Imaging/Diagnostic Tests:   Holley Bouche, MD 02/08/2021, 5:41 AM PGY-1, [place] Intern  pager: [phone], text pages welcome

## 2021-02-08 NOTE — Telephone Encounter (Signed)
Called and spoke with patient. I was able to get him scheduled to see Dr. Valeta Harms on 03/01/21 at 1030am. He is aware of our office location.   Order for PET scan has been placed.   Nothing further needed at time of call.

## 2021-02-08 NOTE — Progress Notes (Signed)
Occupational Therapy Treatment Patient Details Name: Arthur [name] MRN: [id number] DOB: 18-Jan-1941 Today's Date: 02/08/2021   History of present illness 81 y/o male admitted 02/03/21 with dyspnea, found to have a multiple PE,  lung nodules, lymphadenopathy. Started on heparin. 1/8  and after getting up to go to bathroom experienced cardiac arrest, received CPR, epi and obtained RoSC after 5 min,  PMH:  PE in 2017,  hypertension, diabetes, hyperlipidemia, gout, GERD   OT comments  Pt making good progress with functional goals. Pt eager to d/c home today with family assist as needed.    Recommendations for follow up therapy are one component of a multi-disciplinary discharge planning process, led by the attending physician.  Recommendations may be updated based on patient status, additional functional criteria and insurance authorization.    Follow Up Recommendations  Home health OT    Assistance Recommended at Discharge Set up Supervision/Assistance  Patient can return home with the following  A little help with walking and/or transfers;A little help with bathing/dressing/bathroom;Assistance with cooking/housework;Help with stairs or ramp for entrance;Assist for transportation   Equipment Recommendations  Other (comment);Tub/shower seat Armed forces training and education officer)    Recommendations for Other Services      Precautions / Restrictions Precautions Precautions: Fall Restrictions Weight Bearing Restrictions: No       Mobility Bed Mobility               General bed mobility comments: pt in recliner upon arrival    Transfers Overall transfer level: Needs assistance Equipment used: None;Rolling walker (2 wheels) Transfers: Sit to/from Stand Sit to Stand: Min guard                 Balance Overall balance assessment: Needs assistance Sitting-balance support: Feet supported;Feet unsupported;No upper extremity supported Sitting balance-Leahy Scale: Good     Standing balance support:  Single extremity supported;No upper extremity supported;During functional activity Standing balance-Leahy Scale: Poor                             ADL either performed or assessed with clinical judgement   ADL Overall ADL's : Needs assistance/impaired     Grooming: Wash/dry hands;Wash/dry face;Min guard;Standing           Upper Body Dressing : Min guard;Standing;Cueing for safety       Toilet Transfer: Engineer, manufacturing (4 wheels);Regular Toilet;Ambulation;Grab bars   Toileting- Clothing Manipulation and Hygiene: Min guard;Sit to/from stand       Functional mobility during ADLs: Min guard;Cueing for safety;Rolling walker (2 wheels)      Extremity/Trunk Assessment Upper Extremity Assessment Upper Extremity Assessment: Generalized weakness   Lower Extremity Assessment Lower Extremity Assessment: Defer to PT evaluation   Cervical / Trunk Assessment Cervical / Trunk Assessment: Normal    Vision Baseline Vision/History: 1 Wears glasses Ability to See in Adequate Light: 0 Adequate Patient Visual Report: No change from baseline     Perception     Praxis      Cognition Arousal/Alertness: Awake/alert Behavior During Therapy: WFL for tasks assessed/performed Overall Cognitive Status: Within Functional Limits for tasks assessed                                            Exercises     Shoulder Instructions       General Comments  Pertinent Vitals/ Pain       Pain Assessment: No/denies pain Faces Pain Scale: No hurt Facial Expression: Relaxed, neutral Body Movements: Absence of movements Muscle Tension: Relaxed Pain Intervention(s): Monitored during session;Repositioned  Home Living                                          Prior Functioning/Environment              Frequency  Min 2X/week        Progress Toward Goals  OT Goals(current goals can now be found in the care plan section)   Progress towards OT goals: Progressing toward goals     Plan Discharge plan remains appropriate    Co-evaluation                 AM-PAC OT "6 Clicks" Daily Activity     Outcome Measure   Help from another person eating meals?: None Help from another person taking care of personal grooming?: A Little Help from another person toileting, which includes using toliet, bedpan, or urinal?: A Little Help from another person bathing (including washing, rinsing, drying)?: A Little Help from another person to put on and taking off regular upper body clothing?: None Help from another person to put on and taking off regular lower body clothing?: A Little 6 Click Score: 20    End of Session Equipment Utilized During Treatment: Gait belt;Rolling walker (2 wheels)  OT Visit Diagnosis: Unsteadiness on feet (R26.81);Other abnormalities of gait and mobility (R26.89);Muscle weakness (generalized) (M62.81)   Activity Tolerance Patient tolerated treatment well   Patient Left in chair;with call bell/phone within reach;with chair alarm set;with family/visitor present   Nurse Communication          Time: 2563-8937 OT Time Calculation (min): 20 min  Charges: OT General Charges $OT Visit: 1 Visit OT Treatments $Self Care/Home Management : 8-22 mins    Britt Bottom 02/08/2021, 1:31 PM

## 2021-02-08 NOTE — Progress Notes (Signed)
NAME:  Arthur [name], MRN:  [id number], DOB:  November 15, 1940, LOS: 4 ADMISSION DATE:  02/03/2021, CONSULTATION DATE:  1/8 REFERRING MD:  Andria Frames, CHIEF COMPLAINT:  Cardiac arrest   History of Present Illness:  81 y/o male admitted 1/7 with dyspnea, found to have a PE.  Initially treated with heparin, then had a cardiac arrest on the floor after he got up to go to the bathroom.  He had an uprovoked PE in 2017.    Pertinent  Medical History  DM2 Hyperlipidemia Hypertension History of PE in 2017  Significant Hospital Events: Including procedures, antibiotic start and stop dates in addition to other pertinent events   1/7 admission 1/8 brief cardiac arrest 1/10 LHC > multi-vessel disease, with 80% stenosis of mid LAD lesion, PCI with DES there; balloon angioplasty 1st diagonal, LVEDP normal  Imaging: 1/7 CT angiogram chest > acute PE right upper and right middle lobes, both segmental PE, no RV strain; 69mm RLL nodule 1/8 TTE LVEF 50-55%, RVSP 66 mmHg, severely elevated PA pressure, RV systolic function mildly reduced, RA mild dilated   Interim History / Subjective:   Had a DES to LAD earlier this week Called back for recommendations re management of the R LLL nodule He feels well  Objective   Blood pressure (!) 141/75, pulse 64, temperature 98.4 F (36.9 C), temperature source Oral, resp. rate 19, height 5\' 11"  (1.803 m), weight 73.3 kg, SpO2 97 %.        Intake/Output Summary (Last 24 hours) at 02/08/2021 1512 Last data filed at 02/08/2021 0500 Gross per 24 hour  Intake 243 ml  Output 200 ml  Net 43 ml   Filed Weights   02/04/21 1642 02/06/21 0338  Weight: 75 kg 73.3 kg    Examination:  General:  Resting comfortably in bed HENT: NCAT OP clear PULM: CTA B, normal effort CV: RRR, no mgr GI: BS+, soft, nontender MSK: normal bulk and tone Neuro: awake, alert, no distress, MAEW    Resolved Hospital Problem list     Assessment & Plan:  Acute pulmonary embolism,  second lifetime Needs life long anticoagulation Plan apixaban  VT with syncope CAD, s/p PCI Per cardiology plavix  Pulmonary nodule 2mm nodule; some mediastinal adenopathy PET CT as outpatient is the best approach given need for plavix/apix Needs PET CT as outpatient after discharge Will need outpatient pulmonary follow up after  OK for discharge  Plan discussed with the patient, his son, and Dr. Erin Hearing bedside   Best Practice (right click and "Reselect all SmartList Selections" daily)   Diet/type: Regular consistency (see orders) DVT prophylaxis: systemic heparin GI prophylaxis: N/A Lines: N/A Foley:  N/A Code Status:  full code Last date of multidisciplinary goals of care discussion [1/8]  Labs   CBC: Recent Labs  Lab 02/04/21 0415 02/05/21 0018 02/06/21 0202 02/07/21 0049 02/07/21 2211 02/08/21 0553  WBC 12.7* 11.7* 11.7* 11.5* 11.8* 11.7*  NEUTROABS 7.6  --   --   --   --   --   HGB 12.3* 11.8* 11.2* 11.6* 11.8* 11.5*  HCT 38.6* 36.0* 35.3* 36.3* 36.3* 35.3*  MCV 82.8 82.6 83.6 83.1 82.3 83.1  PLT 280 260 271 274 318 193    Basic Metabolic Panel: Recent Labs  Lab 02/04/21 1400 02/05/21 0018 02/06/21 0202 02/07/21 0049 02/07/21 2211 02/08/21 0553  NA  --  137 132* 134* 132* 135  K  --  3.6 3.9 3.9 4.2 3.9  CL  --  101 98  102 101 100  CO2  --  27 24 23 23 25   GLUCOSE  --  141* 179* 138* 151* 133*  BUN  --  17 25* 19 20 19   CREATININE  --  1.46* 1.52* 1.26* 1.08 1.22  CALCIUM  --  7.7* 8.1* 8.4* 8.5* 8.9  MG 1.3* 1.2* 2.2 2.1  --  1.8  PHOS  --  4.8*  --   --   --   --    GFR: Estimated Creatinine Clearance: 50.1 mL/min (by C-G formula based on SCr of 1.22 mg/dL). Recent Labs  Lab 02/06/21 0202 02/07/21 0049 02/07/21 2211 02/08/21 0553  WBC 11.7* 11.5* 11.8* 11.7*    Liver Function Tests: Recent Labs  Lab 02/06/21 0202  AST 28  ALT 28  ALKPHOS 137*  BILITOT 0.8  PROT 6.2*  ALBUMIN 2.6*   No results for input(s): LIPASE,  AMYLASE in the last 168 hours.  No results for input(s): AMMONIA in the last 168 hours.  ABG No results found for: PHART, PCO2ART, PO2ART, HCO3, TCO2, ACIDBASEDEF, O2SAT   Coagulation Profile: Recent Labs  Lab 02/03/21 2119  INR 1.3*    Cardiac Enzymes: No results for input(s): CKTOTAL, CKMB, CKMBINDEX, TROPONINI in the last 168 hours.  HbA1C: HbA1c, POC (controlled diabetic range)  Date/Time Value Ref Range Status  01/16/2021 08:59 AM 6.3 0.0 - 7.0 % Final  10/18/2020 08:50 AM 6.4 0.0 - 7.0 % Final    CBG: Recent Labs  Lab 02/07/21 0808 02/07/21 1141 02/07/21 2107 02/08/21 0842 02/08/21 1141  GLUCAP 125* 126* 181* 142* 178*   Critical care time: n/a    Roselie Awkward, MD [facility] Pager: [phone] Cell: [phone] After 7:00 pm call Elink  [phone]

## 2021-02-08 NOTE — Plan of Care (Signed)
°  Problem: Clinical Measurements: Goal: Ability to maintain clinical measurements within normal limits will improve Outcome: Progressing   Problem: Clinical Measurements: Goal: Respiratory complications will improve Outcome: Progressing   Problem: Clinical Measurements: Goal: Cardiovascular complication will be avoided Outcome: Progressing   Problem: Nutrition: Goal: Adequate nutrition will be maintained Outcome: Progressing   Problem: Pain Managment: Goal: General experience of comfort will improve Outcome: Progressing   Problem: Safety: Goal: Ability to remain free from injury will improve Outcome: Progressing   Problem: Skin Integrity: Goal: Risk for impaired skin integrity will decrease Outcome: Progressing

## 2021-02-08 NOTE — Progress Notes (Signed)
Progress Note  Patient Name: Arthur [name] Date of Encounter: 02/08/2021  Primary Cardiologist: Buford Dresser, MD  Subjective  Coughing and more congested over night   Inpatient Medications    Scheduled Meds:  allopurinol  100 mg Oral Daily   apixaban  5 mg Oral BID   atenolol  50 mg Oral BID   Chlorhexidine Gluconate Cloth  6 each Topical Daily   clopidogrel  75 mg Oral Q breakfast   colchicine  0.6 mg Oral BID   insulin aspart  0-9 Units Subcutaneous TID WC   rosuvastatin  20 mg Oral Daily   sodium chloride flush  3 mL Intravenous Q12H   sodium chloride flush  3 mL Intravenous Q12H   Continuous Infusions:  sodium chloride     PRN Meds: sodium chloride, acetaminophen, albuterol, benzonatate, guaiFENesin-dextromethorphan, loratadine, morphine injection, neomycin-bacitracin-polymyxin, ondansetron (ZOFRAN) IV, sodium chloride flush   Vital Signs    Vitals:   02/07/21 1731 02/07/21 2054 02/08/21 0007 02/08/21 0500  BP: 140/69 (!) 142/68 135/67 (!) 157/63  Pulse: 65 67 61 70  Resp: 18 18 19 20   Temp: 98.2 F (36.8 C) 98.1 F (36.7 C) 97.6 F (36.4 C) 97.6 F (36.4 C)  TempSrc:  Oral Oral Oral  SpO2: 96% 97% 96% 96%  Weight:      Height:        Intake/Output Summary (Last 24 hours) at 02/08/2021 0813 Last data filed at 02/08/2021 0500 Gross per 24 hour  Intake 266.9 ml  Output 200 ml  Net 66.9 ml   Last 3 Weights 02/06/2021 02/04/2021 02/01/2021  Weight (lbs) 161 lb 9.6 oz 165 lb 5.5 oz 163 lb 12.8 oz  Weight (kg) 73.3 kg 75 kg 74.299 kg     Telemetry    SB 50s-NSR 60s with first degree AVB - Personally Reviewed  ECG    NSR 61bpm first degree AVB, nonspecfic TW changes, prior inferior infarct, no acute change from admission - Personally Reviewed  Physical Exam   No distress Elderly [place] male Lungs basilar rhonchi  No murmur Right radial cath site A  Labs    High Sensitivity Troponin:   Recent Labs  Lab 02/04/21 0130 02/04/21 0620  02/04/21 1039 02/04/21 1259 02/05/21 0018  TROPONINIHS 283* 260* 212* 388* 410*      Cardiac EnzymesNo results for input(s): TROPONINI in the last 168 hours. No results for input(s): TROPIPOC in the last 168 hours.   Chemistry Recent Labs  Lab 02/01/21 1100 02/03/21 1412 02/06/21 0202 02/07/21 0049 02/07/21 2211 02/08/21 0553  NA 141   < > 132* 134* 132* 135  K 4.1   < > 3.9 3.9 4.2 3.9  CL 101   < > 98 102 101 100  CO2 26   < > 24 23 23 25   GLUCOSE 131*   < > 179* 138* 151* 133*  BUN 17   < > 25* 19 20 19   CREATININE 1.20   < > 1.52* 1.26* 1.08 1.22  CALCIUM 9.2   < > 8.1* 8.4* 8.5* 8.9  PROT 7.4  --  6.2*  --   --   --   ALBUMIN 4.0  --  2.6*  --   --   --   AST 17  --  28  --   --   --   ALT 10  --  28  --   --   --   ALKPHOS 189*  --  137*  --   --   --  BILITOT 0.7  --  0.8  --   --   --   GFRNONAA  --    < > 46* 58* >60 60*  ANIONGAP  --    < > 10 9 8 10    < > = values in this interval not displayed.     Hematology Recent Labs  Lab 02/07/21 0049 02/07/21 2211 02/08/21 0553  WBC 11.5* 11.8* 11.7*  RBC 4.37 4.41 4.25  HGB 11.6* 11.8* 11.5*  HCT 36.3* 36.3* 35.3*  MCV 83.1 82.3 83.1  MCH 26.5 26.8 27.1  MCHC 32.0 32.5 32.6  RDW 13.8 13.7 13.9  PLT 274 318 320    BNP Recent Labs  Lab 02/03/21 1643  BNP 530.8*     DDimer  Recent Labs  Lab 02/03/21 1642  DDIMER >20.00*     Radiology    No results found.  Cardiac Studies   2D echo 02/04/21    1. Left ventricular ejection fraction, by estimation, is 50 to 55%. The  left ventricle has low normal function. The left ventricle demonstrates  regional wall motion abnormalities (see scoring diagram/findings for  description). There is moderate  asymmetric left ventricular hypertrophy of the basal-septal segment. Left  ventricular diastolic parameters are consistent with Grade II diastolic  dysfunction (pseudonormalization). There is the interventricular septum is  flattened in systole,  consistent  with right ventricular pressure overload.   2. Right ventricular systolic function is mildly reduced. The right  ventricular size is normal. There is severely elevated pulmonary artery  systolic pressure. The estimated right ventricular systolic pressure is  62.9 mmHg.   3. Right atrial size was mildly dilated.   4. There is no evidence of cardiac tamponade.   5. The mitral valve is grossly normal. Mild mitral valve regurgitation.  No evidence of mitral stenosis.   6. The aortic valve was not well visualized. There is moderate  calcification of the aortic valve. There is moderate thickening of the  aortic valve. Aortic valve regurgitation is not visualized. Mild aortic  valve stenosis.   7. The inferior vena cava is normal in size with <50% respiratory  variability, suggesting right atrial pressure of 8 mmHg.   Comparison(s): Changes from prior study are noted.   Conclusion(s)/Recommendation(s): RV with mildly reduced function and  normal size. Severely elevated RVSP of 66 mmHg. There is a focal wall  motion abnormality in the basal to mid septum and the basal inferior wall.  Overall EF is low normal at 50-55%.   Patient Profile     81 y.o. male with hx of DVT/PE 2017, pulmonary nodules, HTN, DM, HLD, GERD who presented 1/7 with worsening dyspnea on exertion. Labs showed mildly elevated flat troponin in the 200s. CT angio showed  with PE within the distal right pulmonary artery, segmental and subsegmental right upper and middle lobe, negative for right heart strain, increased RLL nodule 91mm concerning for metastatic disease. While awaiting admission in the ED, got up to use the bathroom then upon return complained of SOB/chest tightness and became unresponsive/pulseless. Received CPR, epi and obtained ROSC after 5 minutes. EKG done just prior to arrest showed WCT concerning for VT. Once regained pulse, he was awake and protecting his airway. Started on IV amiodarone with bolus.  Cardiology called for further evaluation.   Assessment & Plan    1. Recurrent DVT/PE -  On eliquis ? Hypercoagulable from lung cancer   2. Cardiac arrest with VT -> PEA -> asystole - CAD with  stent to ostial/proximal LAD EF preserved 50-55%  - Plavix monoRx due to anticoagulation need   3. Pulmonary nodules - Likely lung cancer and baseline ILD/Bronchiolitis. Would have pulmonary see and will need PET/CT and further w/u Cannot stop plavix for bronch/biopsy Tessalon perlers for coughing Suggest repeating CXR   5. Essential HTN - norvasc held on atenolol   6. Hyperlipidemia - maintained on pravastatin 40mg  daily - LDL 71    For questions or updates, please contact [place] Please consult [email or web address] for contact info under Cardiology/STEMI.  Signed, Jenkins Rouge, MD 02/08/2021, 8:13 AM

## 2021-02-08 NOTE — Progress Notes (Addendum)
FPTS Brief Progress Note  S:Paged by Night RN about patient continued coughing. Patient says that cough started worsening around 8 pm and has been consistently coughing up thick mucus with blood tinge to it. Denies any acute shortness of breath currently and is saturating well on room air. Denies any chest pain. Has remained afebrile. Says in the past tessalon perles have helped his cough a lot. Does attest to some right sided muscle pain on his back  O: BP 135/67 (BP Location: Right Arm)    Pulse 61    Temp 97.6 F (36.4 C) (Oral)    Resp 19    Ht 5\' 11"  (1.803 m)    Wt 73.3 kg    SpO2 96%    BMI 22.54 kg/m   General: Awake, alert, responsive to questions, tired appearing Head: Normocephalic atraumatic CV: Regular rate and rhythm no murmurs rubs or gallops Respiratory: diffuse rhonchi and coarse crackles in posterior lung fields and anterior lung field. No focal findings. Productive sounding cough present. No significant WOB when not coughing. Saturating in high 90s on room air.  Back: Mild discomfort on R back  Neuro: No focal deficits  A/P: Persistent Cough Currently afebrile and not significantly short of breath, saturating well on room air. S/p robitussin dose that has not helped much. Lungs sounded more rhonchorous than yesterday night when I had seen him. Cough and some blood tinged sputum expected given multiple PE but will monitor. CXR showed prominent bilateral interstitial markings which may represent chronic interstitial lung disease or acute viral bronchiolitis without any focal consolidation. 1/8 no pleural effusion or pneumothorax.  focal consolidation -Advised honey+hot tea to loosen thick secretions -Added 100 mg tessalon perles tid prn -monitor cough/bloody sputum -Robitussin q4h prn -CBC, BMP tomorrow AM -Low threshold for repeat CXR if vital changes indicating hypoxia, worsening shortness of breath, or fever  Right sided pain On CTA Chest 1/7 multiple pulmonary nodules  measuring up to 18 mm in the right lower lobe suspicious for metastatic disease. Could be component of pleuritic pain with this. Also could be component of muscular strain from coughing given some tenderness while palpating. -Monitor -Tylenol - Orders reviewed. Labs for AM ordered, which was adjusted as needed.   Gerrit Heck, MD 02/08/2021, 1:35 AM PGY-1, [facility] Night Resident  Please page [phone] with questions.

## 2021-02-12 ENCOUNTER — Telehealth: Payer: Self-pay

## 2021-02-12 DIAGNOSIS — I2699 Other pulmonary embolism without acute cor pulmonale: Secondary | ICD-10-CM | POA: Diagnosis not present

## 2021-02-12 DIAGNOSIS — Z955 Presence of coronary angioplasty implant and graft: Secondary | ICD-10-CM | POA: Diagnosis not present

## 2021-02-12 DIAGNOSIS — I469 Cardiac arrest, cause unspecified: Secondary | ICD-10-CM | POA: Diagnosis not present

## 2021-02-12 DIAGNOSIS — I10 Essential (primary) hypertension: Secondary | ICD-10-CM | POA: Diagnosis not present

## 2021-02-12 DIAGNOSIS — Z7902 Long term (current) use of antithrombotics/antiplatelets: Secondary | ICD-10-CM | POA: Diagnosis not present

## 2021-02-12 DIAGNOSIS — Z7901 Long term (current) use of anticoagulants: Secondary | ICD-10-CM | POA: Diagnosis not present

## 2021-02-12 DIAGNOSIS — Z7984 Long term (current) use of oral hypoglycemic drugs: Secondary | ICD-10-CM | POA: Diagnosis not present

## 2021-02-12 DIAGNOSIS — E119 Type 2 diabetes mellitus without complications: Secondary | ICD-10-CM | POA: Diagnosis not present

## 2021-02-12 DIAGNOSIS — K219 Gastro-esophageal reflux disease without esophagitis: Secondary | ICD-10-CM | POA: Diagnosis not present

## 2021-02-12 DIAGNOSIS — I2510 Atherosclerotic heart disease of native coronary artery without angina pectoris: Secondary | ICD-10-CM | POA: Diagnosis not present

## 2021-02-12 NOTE — Telephone Encounter (Signed)
[facility] PT calls nurse line requesting [place] [facility] as follows.   2x a week for 3 weeks  1x a week for 1 week   Verbal order given per [facility] protocol.

## 2021-02-14 ENCOUNTER — Encounter: Payer: Self-pay | Admitting: Student

## 2021-02-14 ENCOUNTER — Telehealth (HOSPITAL_COMMUNITY): Payer: Self-pay

## 2021-02-14 ENCOUNTER — Ambulatory Visit (INDEPENDENT_AMBULATORY_CARE_PROVIDER_SITE_OTHER): Payer: Medicare Other | Admitting: Student

## 2021-02-14 ENCOUNTER — Other Ambulatory Visit: Payer: Self-pay

## 2021-02-14 VITALS — BP 162/85 | HR 74 | Wt 157.2 lb

## 2021-02-14 DIAGNOSIS — I469 Cardiac arrest, cause unspecified: Secondary | ICD-10-CM | POA: Diagnosis not present

## 2021-02-14 DIAGNOSIS — Z09 Encounter for follow-up examination after completed treatment for conditions other than malignant neoplasm: Secondary | ICD-10-CM

## 2021-02-14 DIAGNOSIS — Z955 Presence of coronary angioplasty implant and graft: Secondary | ICD-10-CM

## 2021-02-14 DIAGNOSIS — I10 Essential (primary) hypertension: Secondary | ICD-10-CM

## 2021-02-14 DIAGNOSIS — I2699 Other pulmonary embolism without acute cor pulmonale: Secondary | ICD-10-CM

## 2021-02-14 DIAGNOSIS — Z1211 Encounter for screening for malignant neoplasm of colon: Secondary | ICD-10-CM

## 2021-02-14 MED ORDER — APIXABAN 5 MG PO TABS: 5.0000 mg | ORAL_TABLET | Freq: Two times a day (BID) | ORAL | 11 refills | Status: DC

## 2021-02-14 MED ORDER — HYDROCHLOROTHIAZIDE 12.5 MG PO TABS: 12.5000 mg | ORAL_TABLET | Freq: Every day | ORAL | 3 refills | Status: DC

## 2021-02-14 MED ORDER — CLOPIDOGREL BISULFATE 75 MG PO TABS: 75.0000 mg | ORAL_TABLET | Freq: Every day | ORAL | 11 refills | Status: DC

## 2021-02-14 MED ORDER — LOSARTAN POTASSIUM 100 MG PO TABS: 100.0000 mg | ORAL_TABLET | Freq: Every day | ORAL | 3 refills | Status: DC

## 2021-02-14 NOTE — Telephone Encounter (Signed)
Pt insurance is active and benefits verified through [place], DED 0/0 met, out of pocket $8,300/$26.20 met, co-insurance 20%. no pre-authorization required. Passport, 02/14/2021_0 :44am, REF# [id number]   2ndary insurance is active and benefits verified through Medicaid. Co-pay 0, DED 0/0 met, out of pocket 0/0 met, co-insurance 0%. No pre-authorization required. Passport, 02/14/2021_1 :46am, REF# [id number]   Will fax medicaid form once pt is established with a cardiologist.

## 2021-02-14 NOTE — Assessment & Plan Note (Addendum)
Recommendations per discharge summary: 1. Recommend outpatient colonoscopy. Referral placed 2. Lifelong anticoagulation for PE hx Refill sent 3. Repeat Echo for PA pressures and RVF, s/p anticoagulation Scheduled for 2/28 4. PET CT outpatient for pulm nodules - scheduled for 1/25 5. Consider restarting HCTZ and Losartan - Restarting today due to elevated pressures  Encouraged participation in cardiac rehab given persistent weakness. Patient to call Cardiology office to set up follow-up appt.  Remain concerned that patient's PE was provoked in the setting of hypercoaguability 2/2 malignancy given imaging findings. PET Scan next week should be revealing.

## 2021-02-14 NOTE — Telephone Encounter (Signed)
Pt son stated that pt is not interested in the cardiac rehab at this time. Pt son stated that pt is still weak. Closed referral.

## 2021-02-14 NOTE — Progress Notes (Signed)
° ° °  SUBJECTIVE:   CHIEF COMPLAINT / HPI:   Hospital f/u Pt. Recently hospitalized for VT cardiac arrest thought to be 2/2 pulmonary embolism. Also found to have advanced CAD, now s/p PCI. Generally feeling well, though still with generalized weakness. Denies any SOB or CP today. No leg swelling. Feeling like himself, only weaker.  He received a call from cardiac rehab but deferred setting this up until he had a chance to follow-up in our office.  He is scheduled for a follow up with Pulmonology on Feb 2 for multiple pulmonary nodules suspicious for metastatic disease.  Recommendations per discharge summary: Recommend outpatient colonoscopy.  Lifelong anticoagulation for PE hx Repeat Echo for PA pressures and RVF, s/p anticoagulation PET CT outpatient for pulm nodules  Consider restarting HCTZ and Losartan  PERTINENT  PMH / PSH: Vtach arrest 1/8, multiple PE  OBJECTIVE:   BP (!) 162/85    Pulse 74    Wt 157 lb 3.2 oz (71.3 kg)    SpO2 97%    BMI 21.92 kg/m   Physical Exam Vitals reviewed.  Constitutional:      General: He is not in acute distress. HENT:     Mouth/Throat:     Mouth: Mucous membranes are moist.  Cardiovascular:     Rate and Rhythm: Normal rate and regular rhythm.     Pulses: Normal pulses.  Pulmonary:     Effort: Pulmonary effort is normal.     Breath sounds: Normal breath sounds.  Skin:    General: Skin is warm and dry.    ASSESSMENT/PLAN:    Hospital discharge follow-up Recommendations per discharge summary: Recommend outpatient colonoscopy. Referral placed Lifelong anticoagulation for PE hx Refill sent Repeat Echo for PA pressures and RVF, s/p anticoagulation Scheduled for 2/28 PET CT outpatient for pulm nodules - scheduled for 1/25 Consider restarting HCTZ and Losartan - Restarting today due to elevated pressures  Encouraged participation in cardiac rehab given persistent weakness. Patient to call Cardiology office to set up follow-up appt.   Remain concerned that patient's PE was provoked in the setting of hypercoaguability 2/2 malignancy given imaging findings. PET Scan next week should be revealing.     Pearla Dubonnet, MD [place]

## 2021-02-14 NOTE — Patient Instructions (Signed)
Mr. Beitler, It is such a joy to take care you! Thank you for coming in today.   As a reminder, here is a recap of what we talked about today:  -You will need to call your cardiologist to set up a follow-up appointment.  They can help you get into cardiac rehab.  Their phone number is [phone] -I am sending in some refills of your medications, please restart taking your blood pressure medicines, hydrochlorothiazide and losartan -You are scheduled for your PET scan on 1/25 at [facility] -We will repeat your echocardiogram on 2/28 at 8 AM and then see you here in our office at 10 AM  Take care and seek immediate care sooner if you develop any concerns.   Marnee Guarneri, MD [place]

## 2021-02-15 DIAGNOSIS — Z955 Presence of coronary angioplasty implant and graft: Secondary | ICD-10-CM | POA: Diagnosis not present

## 2021-02-15 DIAGNOSIS — I469 Cardiac arrest, cause unspecified: Secondary | ICD-10-CM | POA: Diagnosis not present

## 2021-02-15 DIAGNOSIS — Z7984 Long term (current) use of oral hypoglycemic drugs: Secondary | ICD-10-CM | POA: Diagnosis not present

## 2021-02-15 DIAGNOSIS — Z7902 Long term (current) use of antithrombotics/antiplatelets: Secondary | ICD-10-CM | POA: Diagnosis not present

## 2021-02-15 DIAGNOSIS — I2699 Other pulmonary embolism without acute cor pulmonale: Secondary | ICD-10-CM | POA: Diagnosis not present

## 2021-02-15 DIAGNOSIS — K219 Gastro-esophageal reflux disease without esophagitis: Secondary | ICD-10-CM | POA: Diagnosis not present

## 2021-02-15 DIAGNOSIS — I2510 Atherosclerotic heart disease of native coronary artery without angina pectoris: Secondary | ICD-10-CM | POA: Diagnosis not present

## 2021-02-15 DIAGNOSIS — I10 Essential (primary) hypertension: Secondary | ICD-10-CM | POA: Diagnosis not present

## 2021-02-15 DIAGNOSIS — E119 Type 2 diabetes mellitus without complications: Secondary | ICD-10-CM | POA: Diagnosis not present

## 2021-02-15 DIAGNOSIS — Z7901 Long term (current) use of anticoagulants: Secondary | ICD-10-CM | POA: Diagnosis not present

## 2021-02-19 ENCOUNTER — Ambulatory Visit (INDEPENDENT_AMBULATORY_CARE_PROVIDER_SITE_OTHER): Payer: Medicare Other | Admitting: Cardiology

## 2021-02-19 ENCOUNTER — Other Ambulatory Visit: Payer: Self-pay

## 2021-02-19 ENCOUNTER — Encounter (HOSPITAL_BASED_OUTPATIENT_CLINIC_OR_DEPARTMENT_OTHER): Payer: Self-pay | Admitting: Cardiology

## 2021-02-19 VITALS — BP 140/62 | HR 70 | Ht 71.0 in | Wt 157.4 lb

## 2021-02-19 DIAGNOSIS — I10 Essential (primary) hypertension: Secondary | ICD-10-CM | POA: Diagnosis not present

## 2021-02-19 DIAGNOSIS — Z955 Presence of coronary angioplasty implant and graft: Secondary | ICD-10-CM | POA: Diagnosis not present

## 2021-02-19 DIAGNOSIS — E782 Mixed hyperlipidemia: Secondary | ICD-10-CM | POA: Diagnosis not present

## 2021-02-19 DIAGNOSIS — I2699 Other pulmonary embolism without acute cor pulmonale: Secondary | ICD-10-CM | POA: Diagnosis not present

## 2021-02-19 DIAGNOSIS — Z8674 Personal history of sudden cardiac arrest: Secondary | ICD-10-CM | POA: Diagnosis not present

## 2021-02-19 DIAGNOSIS — Z09 Encounter for follow-up examination after completed treatment for conditions other than malignant neoplasm: Secondary | ICD-10-CM | POA: Diagnosis not present

## 2021-02-19 DIAGNOSIS — E119 Type 2 diabetes mellitus without complications: Secondary | ICD-10-CM | POA: Diagnosis not present

## 2021-02-19 DIAGNOSIS — I2510 Atherosclerotic heart disease of native coronary artery without angina pectoris: Secondary | ICD-10-CM

## 2021-02-19 NOTE — Progress Notes (Signed)
Cardiology Office Note:    Date:  02/19/2021   ID:  Arthur, [name] 04/03/1940, MRN [id number]  PCP:  Lind Covert, MD  Cardiologist:  Buford Dresser, MD  Referring MD: Lind Covert, *   CC: post hospital follow up  History of Present Illness:    Arthur [name] is a 81 y.o. male with a hx of CAD, hypertension, type II diabetes, hyperlipidemia PE/DVT who is seen as a 14 day transition of care follow up for recent admission. Discharge summary and hospital course reviewed, full medication reconciliation done today.  Hospital summary: He presented 02/04/21 with shortness of breath. Found to have multiple PE on evaluation in the ER. While in the ER, he had a cardiac arrest with ROSC obtained with CPR/Epi. ECG done prior concerning for VT. He was stabilized in the ICU. Cath done during the admission noted 80% mLAD lesion that underwent PCI. Notably CT also showed multiple lung nodules concerning for metastatic disease. He has a repeat echo scheduled for 03/27/21.  Today: Has no appetite, has lost about 30 lbs in the last month. He does try to get in an Ensure supplement daily. Has PET scan later this week. Sleep is good. No issues with bleeding. Chronic cough with white sputum.  No chest pain. Mild back pain, tender to palpation. Denies shortness of breath at rest or with normal exertion. No PND, orthopnea, LE edema or unexpected weight gain. No syncope or palpitations.   Past Medical History:  Diagnosis Date   Diabetes mellitus without complication ([place])    High cholesterol    Hypertension    Normal nuclear stress test 2010   stress perfusion study apparently in 2010 in [facility] which he said was negative.    Past Surgical History:  Procedure Laterality Date   CATARACT EXTRACTION, BILATERAL  2006   CORONARY STENT INTERVENTION N/A 02/06/2021   Procedure: CORONARY STENT INTERVENTION;  Surgeon: Jettie Booze, MD;  Location: [place] [facility];   Service: Cardiovascular;  Laterality: N/A;   INTRAVASCULAR ULTRASOUND/IVUS N/A 02/06/2021   Procedure: Intravascular Ultrasound/IVUS;  Surgeon: Jettie Booze, MD;  Location: [place] [facility];  Service: Cardiovascular;  Laterality: N/A;   KNEE SURGERY  2005   LEFT HEART CATH AND CORONARY ANGIOGRAPHY N/A 02/06/2021   Procedure: LEFT HEART CATH AND CORONARY ANGIOGRAPHY;  Surgeon: Jettie Booze, MD;  Location: [place] [facility];  Service: Cardiovascular;  Laterality: N/A;    Current Medications: Current Outpatient Medications on File Prior to Visit  Medication Sig   acetaminophen (TYLENOL) 650 MG CR tablet Take 650 mg by mouth every 8 (eight) hours as needed for pain.   albuterol (PROAIR HFA) 108 (90 Base) MCG/ACT inhaler Inhale 2 puffs into the lungs every 4 (four) hours as needed for wheezing or shortness of breath.   apixaban (ELIQUIS) 5 MG TABS tablet Take 1 tablet (5 mg total) by mouth 2 (two) times daily.   atenolol (TENORMIN) 50 MG tablet Take 1 tablet (50 mg total) by mouth 2 (two) times daily.   benzonatate (TESSALON) 100 MG capsule TAKE 1 CAPSULE BY MOUTH TWICE DAILY AS NEEDED FOR COUGH (Patient taking differently: Take 100 mg by mouth 3 (three) times daily as needed for cough.)   clopidogrel (PLAVIX) 75 MG tablet Take 1 tablet (75 mg total) by mouth daily with breakfast.   diclofenac Sodium (VOLTAREN) 1 % GEL Apply 2 g topically 4 (four) times daily. (Patient taking differently: Apply 2-4 g topically  4 (four) times daily as needed (knees/legs).)   ferrous sulfate 324 (65 Fe) MG TBEC Take 324 mg by mouth daily.   hydrochlorothiazide (HYDRODIURIL) 12.5 MG tablet Take 1 tablet (12.5 mg total) by mouth daily.   loratadine (CLARITIN) 10 MG tablet Take 1 tablet (10 mg total) by mouth daily.   losartan (COZAAR) 100 MG tablet Take 1 tablet (100 mg total) by mouth at bedtime.   metFORMIN (GLUCOPHAGE) 1000 MG tablet Take 1 tablet (1,000 mg total) by mouth 2 (two) times daily with a  meal.   Multiple Vitamins-Minerals (CENTRUM SILVER 50+MEN) TABS Take 1 tablet by mouth daily.   rosuvastatin (CRESTOR) 20 MG tablet Take 1 tablet (20 mg total) by mouth daily.   trolamine salicylate (ASPER-FLEX) 10 % cream Apply 1 application topically as needed for muscle pain.   No current facility-administered medications on file prior to visit.     Allergies:   Ace inhibitors   Social History   Tobacco Use   Smoking status: Never   Smokeless tobacco: Never  Substance Use Topics   Alcohol use: No   Drug use: No    Family History: family history includes Coronary artery disease in his father and mother.  ROS:   Please see the history of present illness.  Additional pertinent ROS otherwise unremarkable.  EKGs/Labs/Other Studies Reviewed:    The following studies were reviewed today: Echo 02/04/21 1. Left ventricular ejection fraction, by estimation, is 50 to 55%. The  left ventricle has low normal function. The left ventricle demonstrates  regional wall motion abnormalities (see scoring diagram/findings for  description). There is moderate  asymmetric left ventricular hypertrophy of the basal-septal segment. Left  ventricular diastolic parameters are consistent with Grade II diastolic  dysfunction (pseudonormalization). There is the interventricular septum is  flattened in systole, consistent  with right ventricular pressure overload.   2. Right ventricular systolic function is mildly reduced. The right  ventricular size is normal. There is severely elevated pulmonary artery  systolic pressure. The estimated right ventricular systolic pressure is  74.0 mmHg.   3. Right atrial size was mildly dilated.   4. There is no evidence of cardiac tamponade.   5. The mitral valve is grossly normal. Mild mitral valve regurgitation.  No evidence of mitral stenosis.   6. The aortic valve was not well visualized. There is moderate  calcification of the aortic valve. There is moderate  thickening of the  aortic valve. Aortic valve regurgitation is not visualized. Mild aortic  valve stenosis.   7. The inferior vena cava is normal in size with <50% respiratory  variability, suggesting right atrial pressure of 8 mmHg.   Comparison(s): Changes from prior study are noted.   Conclusion(s)/Recommendation(s): RV with mildly reduced function and  normal size. Severely elevated RVSP of 66 mmHg. There is a focal wall  motion abnormality in the basal to mid septum and the basal inferior wall.  Overall EF is low normal at 50-55%.   Cath 02/06/21 Ost LAD to Prox LAD lesion is 80% stenosed.   A drug-eluting stent was successfully placed using a STENT ONYX FRONTIER 3.5X15, postdailted to > 4 mm and optimized with IVUS.   Post intervention, there is a 0% residual stenosis.   1st Diag lesion is 90% stenosed.   Balloon angioplasty was performed through the stent struts to the jailed 1st diagonal using a BALLN SAPPHIRE 2.0X12.   Post intervention, there is a 40% residual stenosis.   Mid Cx lesion is 25%  stenosed.   LV end diastolic pressure is normal.   There is no aortic valve stenosis.   From the right radial, it was difficult to not selectively engage the circumflex due to the left main being short.  EBU 3 guide catheter was used by anchoring the guide using circumflex wire, pulling back the guide, and then wiring the LAD.   Complex, proximal LAD lesion.  Due to high risk location, this needed to be treated.  Successful IVUS guided stenting of the proximal LAD.  PTCA of the diagonal which was jailed.  Patient tolerated the procedure well.   Resume heparin 6 hours after sheath removal.  Clopidogrel was used since he will likely be going home on Eliquis or Xarelto.  No aspirin to reduce bleeding risk.  EKG:  EKG is personally reviewed.   02/19/21: SR with 1st degree AV block at 70 bpm  Recent Labs: 02/03/2021: B Natriuretic Peptide 530.8 02/06/2021: ALT 28 02/08/2021: BUN 19; Creatinine,  Ser 1.22; Hemoglobin 11.5; Magnesium 1.8; Platelets 320; Potassium 3.9; Sodium 135  Recent Lipid Panel    Component Value Date/Time   CHOL 133 02/06/2021 0202   CHOL 157 09/08/2019 1011   TRIG 97 02/06/2021 0202   HDL 43 02/06/2021 0202   HDL 41 09/08/2019 1011   CHOLHDL 3.1 02/06/2021 0202   VLDL 19 02/06/2021 0202   LDLCALC 71 02/06/2021 0202   LDLCALC 82 09/08/2019 1011   LDLDIRECT 96 06/16/2018 1041    Physical Exam:    VS:  BP 140/62 (BP Location: Left Arm, Patient Position: Sitting, Cuff Size: Normal)    Pulse 70    Ht 5\' 11"  (1.803 m)    Wt 157 lb 6.4 oz (71.4 kg)    BMI 21.95 kg/m     Wt Readings from Last 3 Encounters:  02/19/21 157 lb 6.4 oz (71.4 kg)  02/14/21 157 lb 3.2 oz (71.3 kg)  02/06/21 161 lb 9.6 oz (73.3 kg)    GEN: Well nourished, well developed in no acute distress HEENT: Normal, moist mucous membranes NECK: No JVD CARDIAC: regular rhythm, normal S1 and S2, no rubs or gallops. No murmur. VASCULAR: Radial and DP pulses 2+ bilaterally. No carotid bruits RESPIRATORY:  Clear to auscultation without rales, wheezing or rhonchi  ABDOMEN: Soft, non-tender, non-distended MUSCULOSKELETAL:  Ambulates independently SKIN: Warm and dry, no edema NEUROLOGIC:  Alert and oriented x 3. No focal neuro deficits noted. PSYCHIATRIC:  Normal affect    ASSESSMENT:    1. Coronary artery disease involving native coronary artery of native heart without angina pectoris   2. Hospital discharge follow-up   3. History of cardiac arrest   4. Status post coronary artery stent placement   5. Mixed hyperlipidemia   6. Pulmonary embolism, other, unspecified chronicity, unspecified whether acute cor pulmonale present ([place])   7. Essential hypertension   8. Type 2 diabetes mellitus without complication, without long-term current use of insulin ([facility])    PLAN:    CAD s/p PCI 02/06/21 History of cardiac arrest during recent hospitalization Pulmonary embolism -no chest pain -continue  apixaban and clopidogrel, monitor for bleeding issues  Hypertension -continue HCTZ, losartan, atenolol  Mixed hyperlipidemia Type II diabetes -continue rosuvastatin -on metformin -would hold on SGLT2i or GLP1RA given lack of appetite and unintentional weight loss  Cardiac risk counseling and prevention recommendations: -recommend heart healthy/Mediterranean diet, with whole grains, fruits, vegetable, fish, lean meats, nuts, and olive oil. Limit salt. -recommend moderate walking, 3-5 times/week for 30-50 minutes each session.  Aim for at least 150 minutes.week. Goal should be pace of 3 miles/hours, or walking 1.5 miles in 30 minutes -recommend avoidance of tobacco products. Avoid excess alcohol. -ASCVD risk score: The ASCVD Risk score (Arnett DK, et al., 2019) failed to calculate for the following reasons:   The 2019 ASCVD risk score is only valid for ages 19 to 61    Plan for follow up: 3 mos or sooner as needed  Buford Dresser, MD, PhD, [facility]    Medication Adjustments/Labs and Tests Ordered: Current medicines are reviewed at length with the patient today.  Concerns regarding medicines are outlined above.  Orders Placed This Encounter  Procedures   EKG 12-Lead   No orders of the defined types were placed in this encounter.   Patient Instructions  Medication Instructions:  Your Physician recommend you continue on your current medication as directed.    *If you need a refill on your cardiac medications before your next appointment, please call your pharmacy*   Lab Work: None ordered today   Testing/Procedures: None ordered today   Follow-Up: At [facility], you and your health needs are our priority.  As part of our continuing mission to provide you with exceptional heart care, we have created designated Provider Care Teams.  These Care Teams include your primary Cardiologist (physician) and Advanced Practice Providers (APPs -   Physician Assistants and Nurse Practitioners) who all work together to provide you with the care you need, when you need it.  We recommend signing up for the patient portal called "MyChart".  Sign up information is provided on this After Visit Summary.  MyChart is used to connect with patients for Virtual Visits (Telemedicine).  Patients are able to view lab/test results, encounter notes, upcoming appointments, etc.  Non-urgent messages can be sent to your provider as well.   To learn more about what you can do with MyChart, go to [email or web address].    Your next appointment:   3 month(s)  The format for your next appointment:   In Person  Provider:   Buford Dresser, MD      Signed, Buford Dresser, MD PhD 02/19/2021 9:49 AM    [place]

## 2021-02-19 NOTE — Patient Instructions (Signed)

## 2021-02-20 ENCOUNTER — Telehealth: Payer: Self-pay

## 2021-02-20 ENCOUNTER — Telehealth: Payer: Self-pay | Admitting: Pulmonary Disease

## 2021-02-20 DIAGNOSIS — K219 Gastro-esophageal reflux disease without esophagitis: Secondary | ICD-10-CM | POA: Diagnosis not present

## 2021-02-20 DIAGNOSIS — I2510 Atherosclerotic heart disease of native coronary artery without angina pectoris: Secondary | ICD-10-CM | POA: Diagnosis not present

## 2021-02-20 DIAGNOSIS — I10 Essential (primary) hypertension: Secondary | ICD-10-CM | POA: Diagnosis not present

## 2021-02-20 DIAGNOSIS — Z955 Presence of coronary angioplasty implant and graft: Secondary | ICD-10-CM | POA: Diagnosis not present

## 2021-02-20 DIAGNOSIS — Z7984 Long term (current) use of oral hypoglycemic drugs: Secondary | ICD-10-CM | POA: Diagnosis not present

## 2021-02-20 DIAGNOSIS — Z7902 Long term (current) use of antithrombotics/antiplatelets: Secondary | ICD-10-CM | POA: Diagnosis not present

## 2021-02-20 DIAGNOSIS — E119 Type 2 diabetes mellitus without complications: Secondary | ICD-10-CM | POA: Diagnosis not present

## 2021-02-20 DIAGNOSIS — Z7901 Long term (current) use of anticoagulants: Secondary | ICD-10-CM | POA: Diagnosis not present

## 2021-02-20 DIAGNOSIS — I469 Cardiac arrest, cause unspecified: Secondary | ICD-10-CM | POA: Diagnosis not present

## 2021-02-20 DIAGNOSIS — I2699 Other pulmonary embolism without acute cor pulmonale: Secondary | ICD-10-CM | POA: Diagnosis not present

## 2021-02-20 NOTE — Telephone Encounter (Signed)
Spoke to [place] showed her where to find the order so she could print it.

## 2021-02-20 NOTE — Telephone Encounter (Signed)
Called and spoke with Arthur [name] and she states that she needs the physical form and with providers signature on the order. Order is from [place].

## 2021-02-20 NOTE — Telephone Encounter (Signed)
I have called and left a message for Arthur [name] this order is in Epic we shouldn't have to fax it

## 2021-02-20 NOTE — Telephone Encounter (Signed)
[facility] OT calls nurse line requesting verbal orders as follows.  2x a week for 1 week   Verbal order given per [facility] protocol.

## 2021-02-21 ENCOUNTER — Other Ambulatory Visit: Payer: Self-pay

## 2021-02-21 ENCOUNTER — Encounter (HOSPITAL_COMMUNITY)
Admission: RE | Admit: 2021-02-21 | Discharge: 2021-02-21 | Disposition: A | Discharge status: Home / Self Care | Payer: Medicare Other | Source: Ambulatory Visit | Attending: Pulmonary Disease | Admitting: Pulmonary Disease

## 2021-02-21 DIAGNOSIS — R918 Other nonspecific abnormal finding of lung field: Secondary | ICD-10-CM | POA: Insufficient documentation

## 2021-02-21 LAB — GLUCOSE, CAPILLARY: Glucose-Capillary: 131 mg/dL — ABNORMAL HIGH (ref 70–99)

## 2021-02-21 MED ORDER — FLUDEOXYGLUCOSE F - 18 (FDG) INJECTION
7.8000 | Freq: Once | INTRAVENOUS | Status: AC
  Administered 2021-02-21: 09:00:00 7.73 via INTRAVENOUS

## 2021-02-22 ENCOUNTER — Telehealth (HOSPITAL_BASED_OUTPATIENT_CLINIC_OR_DEPARTMENT_OTHER): Payer: Self-pay

## 2021-02-22 DIAGNOSIS — I2699 Other pulmonary embolism without acute cor pulmonale: Secondary | ICD-10-CM | POA: Diagnosis not present

## 2021-02-22 DIAGNOSIS — E119 Type 2 diabetes mellitus without complications: Secondary | ICD-10-CM | POA: Diagnosis not present

## 2021-02-22 DIAGNOSIS — K219 Gastro-esophageal reflux disease without esophagitis: Secondary | ICD-10-CM | POA: Diagnosis not present

## 2021-02-22 DIAGNOSIS — I2510 Atherosclerotic heart disease of native coronary artery without angina pectoris: Secondary | ICD-10-CM | POA: Diagnosis not present

## 2021-02-22 DIAGNOSIS — Z7984 Long term (current) use of oral hypoglycemic drugs: Secondary | ICD-10-CM | POA: Diagnosis not present

## 2021-02-22 DIAGNOSIS — Z7901 Long term (current) use of anticoagulants: Secondary | ICD-10-CM | POA: Diagnosis not present

## 2021-02-22 DIAGNOSIS — I10 Essential (primary) hypertension: Secondary | ICD-10-CM | POA: Diagnosis not present

## 2021-02-22 DIAGNOSIS — Z7902 Long term (current) use of antithrombotics/antiplatelets: Secondary | ICD-10-CM | POA: Diagnosis not present

## 2021-02-22 DIAGNOSIS — I469 Cardiac arrest, cause unspecified: Secondary | ICD-10-CM | POA: Diagnosis not present

## 2021-02-22 DIAGNOSIS — Z955 Presence of coronary angioplasty implant and graft: Secondary | ICD-10-CM | POA: Diagnosis not present

## 2021-02-22 NOTE — Telephone Encounter (Signed)
Cardiac Rehab forms received and completed by Dr. Harrell Gave.  Forms faxed back to requesting facility

## 2021-02-26 DIAGNOSIS — K219 Gastro-esophageal reflux disease without esophagitis: Secondary | ICD-10-CM | POA: Diagnosis not present

## 2021-02-26 DIAGNOSIS — I2510 Atherosclerotic heart disease of native coronary artery without angina pectoris: Secondary | ICD-10-CM | POA: Diagnosis not present

## 2021-02-26 DIAGNOSIS — I10 Essential (primary) hypertension: Secondary | ICD-10-CM | POA: Diagnosis not present

## 2021-02-26 DIAGNOSIS — I2699 Other pulmonary embolism without acute cor pulmonale: Secondary | ICD-10-CM | POA: Diagnosis not present

## 2021-02-26 DIAGNOSIS — Z7902 Long term (current) use of antithrombotics/antiplatelets: Secondary | ICD-10-CM | POA: Diagnosis not present

## 2021-02-26 DIAGNOSIS — I469 Cardiac arrest, cause unspecified: Secondary | ICD-10-CM | POA: Diagnosis not present

## 2021-02-26 DIAGNOSIS — E119 Type 2 diabetes mellitus without complications: Secondary | ICD-10-CM | POA: Diagnosis not present

## 2021-02-26 DIAGNOSIS — Z7901 Long term (current) use of anticoagulants: Secondary | ICD-10-CM | POA: Diagnosis not present

## 2021-02-26 DIAGNOSIS — Z955 Presence of coronary angioplasty implant and graft: Secondary | ICD-10-CM | POA: Diagnosis not present

## 2021-02-26 DIAGNOSIS — Z7984 Long term (current) use of oral hypoglycemic drugs: Secondary | ICD-10-CM | POA: Diagnosis not present

## 2021-02-27 DIAGNOSIS — I2699 Other pulmonary embolism without acute cor pulmonale: Secondary | ICD-10-CM | POA: Diagnosis not present

## 2021-02-27 DIAGNOSIS — Z7901 Long term (current) use of anticoagulants: Secondary | ICD-10-CM | POA: Diagnosis not present

## 2021-02-27 DIAGNOSIS — I10 Essential (primary) hypertension: Secondary | ICD-10-CM | POA: Diagnosis not present

## 2021-02-27 DIAGNOSIS — K219 Gastro-esophageal reflux disease without esophagitis: Secondary | ICD-10-CM | POA: Diagnosis not present

## 2021-02-27 DIAGNOSIS — Z955 Presence of coronary angioplasty implant and graft: Secondary | ICD-10-CM | POA: Diagnosis not present

## 2021-02-27 DIAGNOSIS — Z7984 Long term (current) use of oral hypoglycemic drugs: Secondary | ICD-10-CM | POA: Diagnosis not present

## 2021-02-27 DIAGNOSIS — E119 Type 2 diabetes mellitus without complications: Secondary | ICD-10-CM | POA: Diagnosis not present

## 2021-02-27 DIAGNOSIS — I469 Cardiac arrest, cause unspecified: Secondary | ICD-10-CM | POA: Diagnosis not present

## 2021-02-27 DIAGNOSIS — I2510 Atherosclerotic heart disease of native coronary artery without angina pectoris: Secondary | ICD-10-CM | POA: Diagnosis not present

## 2021-02-27 DIAGNOSIS — Z7902 Long term (current) use of antithrombotics/antiplatelets: Secondary | ICD-10-CM | POA: Diagnosis not present

## 2021-03-01 ENCOUNTER — Encounter: Payer: Self-pay | Admitting: Pulmonary Disease

## 2021-03-01 ENCOUNTER — Ambulatory Visit (INDEPENDENT_AMBULATORY_CARE_PROVIDER_SITE_OTHER): Payer: Medicare Other | Admitting: Pulmonary Disease

## 2021-03-01 ENCOUNTER — Other Ambulatory Visit: Payer: Self-pay

## 2021-03-01 VITALS — BP 140/62 | HR 78 | Temp 98.0°F | Ht 71.0 in | Wt 156.4 lb

## 2021-03-01 DIAGNOSIS — Z789 Other specified health status: Secondary | ICD-10-CM | POA: Diagnosis not present

## 2021-03-01 DIAGNOSIS — R599 Enlarged lymph nodes, unspecified: Secondary | ICD-10-CM

## 2021-03-01 DIAGNOSIS — R918 Other nonspecific abnormal finding of lung field: Secondary | ICD-10-CM | POA: Diagnosis not present

## 2021-03-01 DIAGNOSIS — R942 Abnormal results of pulmonary function studies: Secondary | ICD-10-CM | POA: Diagnosis not present

## 2021-03-01 NOTE — Progress Notes (Signed)
Synopsis: Referred in February 2023 for abnormal PET scan, recent hospitalization by Lind Covert, *  Subjective:   PATIENT ID: Arthur [name]: male DOB: September 13, 1940, MRN: [id number]  Chief Complaint  Patient presents with   Consult    Patient is here to talk about nodules    This is an 81 year old gentleman, past medical history of type 2 diabetes, hypertension, hypercholesterolemia.Patient was recently admitted to the hospital on 02/03/2021 found to have a pulmonary embolism.  Ultimately was taken for heart catheterization with climbing troponins.  Prior to that during his hospitalization while in the emergency department suffered cardiac arrest required CPR and epinephrine.  ECG with VT.  He was taken to the Cath Lab found to have 80% stenosis of the mid LAD he underwent PCI with DES.  Also had balloon angioplasty to a separate lesion.  Was started on Plavix at discharge.  Here today for follow-up after recent nuclear medicine pet imaging which was completed on 02/21/2021.  Nuclear medicine pet imaging revealed hypermetabolic lesions within a right lower lobe lung nodule with associated adenopathy of the chest concerning for primary lung malignancy with associated metastatic disease potentially to the T11 spine as well as adenopathy.  This is concerning for advanced age bronchogenic carcinoma.  We reviewed CT pet imaging today in the office with patient's son and patient.    Past Medical History:  Diagnosis Date   Diabetes mellitus without complication ([place])    High cholesterol    Hypertension    Normal nuclear stress test 2010   stress perfusion study apparently in 2010 in [facility] which he said was negative.     Family History  Problem Relation Age of Onset   Coronary artery disease Father    Coronary artery disease Mother      Past Surgical History:  Procedure Laterality Date   CATARACT EXTRACTION, BILATERAL  2006   CORONARY STENT INTERVENTION N/A  02/06/2021   Procedure: CORONARY STENT INTERVENTION;  Surgeon: Jettie Booze, MD;  Location: [place] [facility];  Service: Cardiovascular;  Laterality: N/A;   INTRAVASCULAR ULTRASOUND/IVUS N/A 02/06/2021   Procedure: Intravascular Ultrasound/IVUS;  Surgeon: Jettie Booze, MD;  Location: [place] [facility];  Service: Cardiovascular;  Laterality: N/A;   KNEE SURGERY  2005   LEFT HEART CATH AND CORONARY ANGIOGRAPHY N/A 02/06/2021   Procedure: LEFT HEART CATH AND CORONARY ANGIOGRAPHY;  Surgeon: Jettie Booze, MD;  Location: [place] [facility];  Service: Cardiovascular;  Laterality: N/A;    Social History   Socioeconomic History   Marital status: Married    Spouse name: Not on file   Number of children: Not on file   Years of education: Not on file   Highest education level: Not on file  Occupational History   Not on file  Tobacco Use   Smoking status: Never   Smokeless tobacco: Never  Substance and Sexual Activity   Alcohol use: No   Drug use: No   Sexual activity: Not on file  Other Topics Concern   Not on file  Social History Narrative   Social History:   He works part-time as a Doctor, general practice. He is married with 3 children. He's never smoked cigarettes and does not drink alcohol.            Social Determinants of Health   Financial Resource Strain: Not on file  Food Insecurity: Not on file  Transportation Needs: Not on file  Physical Activity: Not on file  Stress: Not on file  Social Connections: Not on file  Intimate Partner Violence: Not on file     Allergies  Allergen Reactions   Ace Inhibitors     REACTION: Cough     Outpatient Medications Prior to Visit  Medication Sig Dispense Refill   acetaminophen (TYLENOL) 650 MG CR tablet Take 650 mg by mouth every 8 (eight) hours as needed for pain.     albuterol (PROAIR HFA) 108 (90 Base) MCG/ACT inhaler Inhale 2 puffs into the lungs every 4 (four) hours as needed for wheezing or shortness of breath. 18 g 0    apixaban (ELIQUIS) 5 MG TABS tablet Take 1 tablet (5 mg total) by mouth 2 (two) times daily. 60 tablet 11   atenolol (TENORMIN) 50 MG tablet Take 1 tablet (50 mg total) by mouth 2 (two) times daily. 180 tablet 3   benzonatate (TESSALON) 100 MG capsule TAKE 1 CAPSULE BY MOUTH TWICE DAILY AS NEEDED FOR COUGH (Patient taking differently: Take 100 mg by mouth 3 (three) times daily as needed for cough.) 15 capsule 0   clopidogrel (PLAVIX) 75 MG tablet Take 1 tablet (75 mg total) by mouth daily with breakfast. 30 tablet 11   diclofenac Sodium (VOLTAREN) 1 % GEL Apply 2 g topically 4 (four) times daily. (Patient taking differently: Apply 2-4 g topically 4 (four) times daily as needed (knees/legs).) 50 g 1   ferrous sulfate 324 (65 Fe) MG TBEC Take 324 mg by mouth daily. 30 tablet    hydrochlorothiazide (HYDRODIURIL) 12.5 MG tablet Take 1 tablet (12.5 mg total) by mouth daily. 90 tablet 3   loratadine (CLARITIN) 10 MG tablet Take 1 tablet (10 mg total) by mouth daily. 30 tablet 1   losartan (COZAAR) 100 MG tablet Take 1 tablet (100 mg total) by mouth at bedtime. 90 tablet 3   metFORMIN (GLUCOPHAGE) 1000 MG tablet Take 1 tablet (1,000 mg total) by mouth 2 (two) times daily with a meal. 180 tablet 3   Multiple Vitamins-Minerals (CENTRUM SILVER 50+MEN) TABS Take 1 tablet by mouth daily.     rosuvastatin (CRESTOR) 20 MG tablet Take 1 tablet (20 mg total) by mouth daily. 30 tablet 0   trolamine salicylate (ASPER-FLEX) 10 % cream Apply 1 application topically as needed for muscle pain. 85 g 0   No facility-administered medications prior to visit.    Review of Systems  Constitutional:  Negative for chills, fever, malaise/fatigue and weight loss.  HENT:  Negative for hearing loss, sore throat and tinnitus.   Eyes:  Negative for blurred vision and double vision.  Respiratory:  Negative for cough, hemoptysis, sputum production, shortness of breath, wheezing and stridor.   Cardiovascular:  Negative for chest  pain, palpitations, orthopnea, leg swelling and PND.  Gastrointestinal:  Negative for abdominal pain, constipation, diarrhea, heartburn, nausea and vomiting.  Genitourinary:  Negative for dysuria, hematuria and urgency.  Musculoskeletal:  Positive for back pain. Negative for joint pain and myalgias.  Skin:  Negative for itching and rash.  Neurological:  Negative for dizziness, tingling, weakness and headaches.  Endo/Heme/Allergies:  Negative for environmental allergies. Does not bruise/bleed easily.  Psychiatric/Behavioral:  Negative for depression. The patient is not nervous/anxious and does not have insomnia.   All other systems reviewed and are negative.   Objective:  Physical Exam Vitals reviewed.  Constitutional:      General: He is not in acute distress.    Appearance: He is well-developed.  HENT:     Head: Normocephalic and  atraumatic.  Eyes:     General: No scleral icterus.    Conjunctiva/sclera: Conjunctivae normal.     Pupils: Pupils are equal, round, and reactive to light.  Neck:     Vascular: No JVD.     Trachea: No tracheal deviation.  Cardiovascular:     Rate and Rhythm: Normal rate and regular rhythm.     Heart sounds: Normal heart sounds. No murmur heard. Pulmonary:     Effort: Pulmonary effort is normal. No tachypnea, accessory muscle usage or respiratory distress.     Breath sounds: No stridor. No wheezing, rhonchi or rales.  Abdominal:     General: There is no distension.     Palpations: Abdomen is soft.     Tenderness: There is no abdominal tenderness.  Musculoskeletal:        General: No tenderness.     Cervical back: Neck supple.  Lymphadenopathy:     Cervical: No cervical adenopathy.  Skin:    General: Skin is warm and dry.     Capillary Refill: Capillary refill takes less than 2 seconds.     Findings: No rash.  Neurological:     Mental Status: He is alert and oriented to person, place, and time.  Psychiatric:        Behavior: Behavior normal.      Vitals:   03/01/21 1038  BP: 140/62  Pulse: 78  Temp: 98 F (36.7 C)  TempSrc: Oral  SpO2: 94%  Weight: 156 lb 6.4 oz (70.9 kg)  Height: 5\' 11"  (1.803 m)   94% on RA BMI Readings from Last 3 Encounters:  03/01/21 21.81 kg/m  02/19/21 21.95 kg/m  02/14/21 21.92 kg/m   Wt Readings from Last 3 Encounters:  03/01/21 156 lb 6.4 oz (70.9 kg)  02/19/21 157 lb 6.4 oz (71.4 kg)  02/14/21 157 lb 3.2 oz (71.3 kg)     CBC    Component Value Date/Time   WBC 11.7 (H) 02/08/2021 0553   RBC 4.25 02/08/2021 0553   HGB 11.5 (L) 02/08/2021 0553   HGB 13.5 12/20/2020 1033   HCT 35.3 (L) 02/08/2021 0553   HCT 42.4 12/20/2020 1033   PLT 320 02/08/2021 0553   PLT 253 12/20/2020 1033   MCV 83.1 02/08/2021 0553   MCV 84 12/20/2020 1033   MCH 27.1 02/08/2021 0553   MCHC 32.6 02/08/2021 0553   RDW 13.9 02/08/2021 0553   RDW 12.9 12/20/2020 1033   LYMPHSABS 1.9 02/04/2021 0415   LYMPHSABS 2.2 08/17/2020 0848   MONOABS 1.4 (H) 02/04/2021 0415   EOSABS 1.6 (H) 02/04/2021 0415   EOSABS 0.4 08/17/2020 0848   BASOSABS 0.2 (H) 02/04/2021 0415   BASOSABS 0.1 08/17/2020 0848     Chest Imaging: Nuclear medicine pet imaging 9/76/7341: Hypermetabolic lung nodule with associated adenopathy concerning for an advanced age bronchogenic carcinoma. The patient's images have been independently reviewed by me.    Pulmonary Functions Testing Results: No flowsheet data found.  FeNO:   Pathology:   Echocardiogram:   Heart Catheterization:     Assessment & Plan:     ICD-10-CM   1. Pulmonary nodules/lesions, multiple  R91.8     2. Abnormal PET scan, lung  R94.2     3. Nonsmoker  Z78.9       Discussion:  This is an 81 year old gentleman, non-smoker, grew up in [place], here in the early 90s.  No family history of malignancy, no history of solid tumor malignancy.  Unfortunately had a recent cardiac  arrest, V. tach, left heart catheterization with DES to mid LAD on Plavix and  Eliquis.  Also found to have bilateral PE.  Plan: His nuclear medicine pet imaging is concerning for an advanced age bronchogenic carcinoma.   Overall, he needs to undergo tissue biopsy of the mediastinal adenopathy to confirm diagnosis. The tricky part is going to be coordinating his antiplatelet and anticoagulation need. I do not think that we can wait 3 months or 6 months before getting a tissue biopsy. I will try to coordinate with cardiology. I think the most appropriate steps would be admission to the hospital for a 5-day Plavix washout while on heparin and cangrelor. We will try to arrange this for mid February.  Tentative admission date on 17 February followed by bronchoscopy on the 22nd.    Current Outpatient Medications:    acetaminophen (TYLENOL) 650 MG CR tablet, Take 650 mg by mouth every 8 (eight) hours as needed for pain., Disp: , Rfl:    albuterol (PROAIR HFA) 108 (90 Base) MCG/ACT inhaler, Inhale 2 puffs into the lungs every 4 (four) hours as needed for wheezing or shortness of breath., Disp: 18 g, Rfl: 0   apixaban (ELIQUIS) 5 MG TABS tablet, Take 1 tablet (5 mg total) by mouth 2 (two) times daily., Disp: 60 tablet, Rfl: 11   atenolol (TENORMIN) 50 MG tablet, Take 1 tablet (50 mg total) by mouth 2 (two) times daily., Disp: 180 tablet, Rfl: 3   benzonatate (TESSALON) 100 MG capsule, TAKE 1 CAPSULE BY MOUTH TWICE DAILY AS NEEDED FOR COUGH (Patient taking differently: Take 100 mg by mouth 3 (three) times daily as needed for cough.), Disp: 15 capsule, Rfl: 0   clopidogrel (PLAVIX) 75 MG tablet, Take 1 tablet (75 mg total) by mouth daily with breakfast., Disp: 30 tablet, Rfl: 11   diclofenac Sodium (VOLTAREN) 1 % GEL, Apply 2 g topically 4 (four) times daily. (Patient taking differently: Apply 2-4 g topically 4 (four) times daily as needed (knees/legs).), Disp: 50 g, Rfl: 1   ferrous sulfate 324 (65 Fe) MG TBEC, Take 324 mg by mouth daily., Disp: 30 tablet, Rfl:     hydrochlorothiazide (HYDRODIURIL) 12.5 MG tablet, Take 1 tablet (12.5 mg total) by mouth daily., Disp: 90 tablet, Rfl: 3   loratadine (CLARITIN) 10 MG tablet, Take 1 tablet (10 mg total) by mouth daily., Disp: 30 tablet, Rfl: 1   losartan (COZAAR) 100 MG tablet, Take 1 tablet (100 mg total) by mouth at bedtime., Disp: 90 tablet, Rfl: 3   metFORMIN (GLUCOPHAGE) 1000 MG tablet, Take 1 tablet (1,000 mg total) by mouth 2 (two) times daily with a meal., Disp: 180 tablet, Rfl: 3   Multiple Vitamins-Minerals (CENTRUM SILVER 50+MEN) TABS, Take 1 tablet by mouth daily., Disp: , Rfl:    rosuvastatin (CRESTOR) 20 MG tablet, Take 1 tablet (20 mg total) by mouth daily., Disp: 30 tablet, Rfl: 0   trolamine salicylate (ASPER-FLEX) 10 % cream, Apply 1 application topically as needed for muscle pain., Disp: 85 g, Rfl: 0  I spent 42 minutes dedicated to the care of this patient on the date of this encounter to include pre-visit review of records, face-to-face time with the patient discussing conditions above, post visit ordering of testing, clinical documentation with the electronic health record, making appropriate referrals as documented, and communicating necessary findings to members of the patients care team.   Garner Nash, DO [facility] 03/01/2021 11:20 AM

## 2021-03-01 NOTE — Patient Instructions (Addendum)
Thank you for visiting Dr. Valeta Harms at [facility]. Today we recommend the following:  Planned bronchoscopy possibly on 22nd of Feb [place] prior on 17th of Feb  Before any of these plans I will need to coordinate with Cardiologist.   Return in about 2 months (around 04/29/2021).    Please do your part to reduce the spread of COVID-19.

## 2021-03-02 ENCOUNTER — Other Ambulatory Visit: Payer: Self-pay

## 2021-03-02 DIAGNOSIS — I10 Essential (primary) hypertension: Secondary | ICD-10-CM | POA: Diagnosis not present

## 2021-03-02 DIAGNOSIS — I2699 Other pulmonary embolism without acute cor pulmonale: Secondary | ICD-10-CM | POA: Diagnosis not present

## 2021-03-02 DIAGNOSIS — I469 Cardiac arrest, cause unspecified: Secondary | ICD-10-CM | POA: Diagnosis not present

## 2021-03-02 DIAGNOSIS — Z955 Presence of coronary angioplasty implant and graft: Secondary | ICD-10-CM | POA: Diagnosis not present

## 2021-03-02 DIAGNOSIS — K219 Gastro-esophageal reflux disease without esophagitis: Secondary | ICD-10-CM | POA: Diagnosis not present

## 2021-03-02 DIAGNOSIS — Z7984 Long term (current) use of oral hypoglycemic drugs: Secondary | ICD-10-CM | POA: Diagnosis not present

## 2021-03-02 DIAGNOSIS — Z7901 Long term (current) use of anticoagulants: Secondary | ICD-10-CM | POA: Diagnosis not present

## 2021-03-02 DIAGNOSIS — E119 Type 2 diabetes mellitus without complications: Secondary | ICD-10-CM | POA: Diagnosis not present

## 2021-03-02 DIAGNOSIS — I2510 Atherosclerotic heart disease of native coronary artery without angina pectoris: Secondary | ICD-10-CM | POA: Diagnosis not present

## 2021-03-02 DIAGNOSIS — R918 Other nonspecific abnormal finding of lung field: Secondary | ICD-10-CM | POA: Insufficient documentation

## 2021-03-02 DIAGNOSIS — Z7902 Long term (current) use of antithrombotics/antiplatelets: Secondary | ICD-10-CM | POA: Diagnosis not present

## 2021-03-02 MED ORDER — ROSUVASTATIN CALCIUM 20 MG PO TABS: 20.0000 mg | ORAL_TABLET | Freq: Every day | ORAL | 2 refills | Status: DC

## 2021-03-02 NOTE — Progress Notes (Signed)
PCCM:  I called and spoke with Dr. Harrell Gave. She agrees with plan for admission, IV heparin and cangrelor for plavix washout.   We will tentatively plan for admission on Feb 17th to start the process. I will see if the FPTS would like to direct admit on that day  Plans for procedure on Feb 22nd.   [facility] 03/02/2021 12:49 PM

## 2021-03-02 NOTE — Addendum Note (Signed)
Addended by: Garner Nash on: 03/02/2021 01:04 PM   Modules accepted: Orders

## 2021-03-05 ENCOUNTER — Telehealth (HOSPITAL_COMMUNITY): Payer: Self-pay

## 2021-03-05 NOTE — Telephone Encounter (Signed)
Returned pt daughter phone call and left a message for her to call back in reference to pt's cardiac rehab referral.

## 2021-03-06 DIAGNOSIS — Z7902 Long term (current) use of antithrombotics/antiplatelets: Secondary | ICD-10-CM | POA: Diagnosis not present

## 2021-03-06 DIAGNOSIS — Z7984 Long term (current) use of oral hypoglycemic drugs: Secondary | ICD-10-CM | POA: Diagnosis not present

## 2021-03-06 DIAGNOSIS — Z7901 Long term (current) use of anticoagulants: Secondary | ICD-10-CM | POA: Diagnosis not present

## 2021-03-06 DIAGNOSIS — I2510 Atherosclerotic heart disease of native coronary artery without angina pectoris: Secondary | ICD-10-CM | POA: Diagnosis not present

## 2021-03-06 DIAGNOSIS — K219 Gastro-esophageal reflux disease without esophagitis: Secondary | ICD-10-CM | POA: Diagnosis not present

## 2021-03-06 DIAGNOSIS — Z955 Presence of coronary angioplasty implant and graft: Secondary | ICD-10-CM | POA: Diagnosis not present

## 2021-03-06 DIAGNOSIS — I469 Cardiac arrest, cause unspecified: Secondary | ICD-10-CM | POA: Diagnosis not present

## 2021-03-06 DIAGNOSIS — E119 Type 2 diabetes mellitus without complications: Secondary | ICD-10-CM | POA: Diagnosis not present

## 2021-03-06 DIAGNOSIS — I10 Essential (primary) hypertension: Secondary | ICD-10-CM | POA: Diagnosis not present

## 2021-03-06 DIAGNOSIS — R599 Enlarged lymph nodes, unspecified: Secondary | ICD-10-CM | POA: Insufficient documentation

## 2021-03-06 DIAGNOSIS — I2699 Other pulmonary embolism without acute cor pulmonale: Secondary | ICD-10-CM | POA: Diagnosis not present

## 2021-03-11 ENCOUNTER — Encounter: Payer: Self-pay | Admitting: Family Medicine

## 2021-03-12 NOTE — Progress Notes (Signed)
° ° °  SUBJECTIVE:   CHIEF COMPLAINT / HPI:   Accompanied by his son  Foot Swelling Started about 4-5 days ago.   Much worse on the R foot - hot red swollen and painful.  Is like prior episodes. Has been off both colchicine and allopurinol since left the hospital No fevers or trauma  No leg swelling or shortness of breath or chest pain   PE Taking eliquis regularly.  Brings in today.  No bleeding   Lung Masses To have admission with IV medications and delayed biopsy    PERTINENT  PMH / PSH: has been having intermittently worsening appetite  OBJECTIVE:   BP 121/76    Pulse 68    Ht 5\' 11"  (1.803 m)    SpO2 95%    BMI 21.81 kg/m   R foot - diffuse lateral swelling with increased warmth and moderate edema.  Mildly tender to palpation L foot - slight edema and redness.  Minimal tenderness Heart - Regular rate and rhythm.  No murmurs, gallops or rubs.    Lungs:  Normal respiratory effort, chest expands symmetrically. Lungs are clear to auscultation, no crackles or wheezes. No calf tenderness   ASSESSMENT/PLAN:   Gout I think his foot pain today is due to gout since being off his medications.  Will restrart with mild increase in his colchicine to 1 twice a day.  No signs of infection or dvt   Lung mass Will need to be admitted early for reversible anticoagulant therapy      Lind Covert, MD [place]

## 2021-03-12 NOTE — Telephone Encounter (Signed)
Called and spoke with pt in regards to CR, pt stated he has to have a biopsy. And will like to hold off on CR at this time. Will call CR when he is ready to schedule.   Closed referral

## 2021-03-12 NOTE — Telephone Encounter (Signed)
[name] calls nurse line regarding mychart message. Patient has been having pain and swelling in right foot since Thursday or Friday of last week. Denies injury, redness, calf pain, SHOB.   Does report that he has had pain with weight bearing on right foot.   Offered appointment for today. Patient elected to be seen tomorrow with Dr. Erin Hearing. ED precautions given.   Talbot Grumbling, RN

## 2021-03-13 ENCOUNTER — Ambulatory Visit (INDEPENDENT_AMBULATORY_CARE_PROVIDER_SITE_OTHER): Payer: Medicare Other | Admitting: Family Medicine

## 2021-03-13 ENCOUNTER — Other Ambulatory Visit: Payer: Self-pay

## 2021-03-13 ENCOUNTER — Encounter: Payer: Self-pay | Admitting: Family Medicine

## 2021-03-13 DIAGNOSIS — M1A0310 Idiopathic chronic gout, right wrist, without tophus (tophi): Secondary | ICD-10-CM

## 2021-03-13 DIAGNOSIS — R918 Other nonspecific abnormal finding of lung field: Secondary | ICD-10-CM | POA: Diagnosis not present

## 2021-03-13 MED ORDER — COLCHICINE 0.6 MG PO TABS: 0.6000 mg | ORAL_TABLET | Freq: Every day | ORAL | 1 refills | Status: DC

## 2021-03-13 NOTE — Assessment & Plan Note (Signed)
I think his foot pain today is due to gout since being off his medications.  Will restrart with mild increase in his colchicine to 1 twice a day.  No signs of infection or dvt

## 2021-03-13 NOTE — Patient Instructions (Signed)
Good to see you today - Thank you for coming in  Things we discussed today:  Foot pain - I think this is gout  Take Colchicine - 1 tab twice a day for 4 days then take one daily  Take Allopurinol - 1 tab every day   Take tylenol for pain   You need to go into the hospital early on Thursday I will call you tomorrow about when to come  Do not take any medications on Thursday morning

## 2021-03-13 NOTE — Assessment & Plan Note (Signed)
Will need to be admitted early for reversible anticoagulant therapy

## 2021-03-14 ENCOUNTER — Telehealth: Payer: Self-pay | Admitting: Family Medicine

## 2021-03-14 NOTE — Telephone Encounter (Signed)
Spoke with daughter in law [phone] and bed control [facility]  Mr Makara will be called tomorrow 2/16 for admission to start IVs on Friday AM  He should not take his oral medications Thursday night other wise continue his current medications

## 2021-03-15 ENCOUNTER — Inpatient Hospital Stay (HOSPITAL_COMMUNITY)
Admission: AD | Admit: 2021-03-15 | Discharge: 2021-03-22 | DRG: 840 | Disposition: A | Discharge status: Home / Self Care | Payer: Medicare Other | Attending: Family Medicine | Admitting: Family Medicine

## 2021-03-15 DIAGNOSIS — M109 Gout, unspecified: Secondary | ICD-10-CM | POA: Diagnosis not present

## 2021-03-15 DIAGNOSIS — Z79899 Other long term (current) drug therapy: Secondary | ICD-10-CM

## 2021-03-15 DIAGNOSIS — Z9842 Cataract extraction status, left eye: Secondary | ICD-10-CM | POA: Diagnosis not present

## 2021-03-15 DIAGNOSIS — Z8674 Personal history of sudden cardiac arrest: Secondary | ICD-10-CM

## 2021-03-15 DIAGNOSIS — I471 Supraventricular tachycardia: Secondary | ICD-10-CM | POA: Diagnosis not present

## 2021-03-15 DIAGNOSIS — E7800 Pure hypercholesterolemia, unspecified: Secondary | ICD-10-CM | POA: Diagnosis present

## 2021-03-15 DIAGNOSIS — I82463 Acute embolism and thrombosis of calf muscular vein, bilateral: Secondary | ICD-10-CM | POA: Diagnosis present

## 2021-03-15 DIAGNOSIS — H53461 Homonymous bilateral field defects, right side: Secondary | ICD-10-CM | POA: Diagnosis not present

## 2021-03-15 DIAGNOSIS — I10 Essential (primary) hypertension: Secondary | ICD-10-CM | POA: Diagnosis not present

## 2021-03-15 DIAGNOSIS — K219 Gastro-esophageal reflux disease without esophagitis: Secondary | ICD-10-CM | POA: Diagnosis not present

## 2021-03-15 DIAGNOSIS — E1149 Type 2 diabetes mellitus with other diabetic neurological complication: Secondary | ICD-10-CM | POA: Diagnosis not present

## 2021-03-15 DIAGNOSIS — I2699 Other pulmonary embolism without acute cor pulmonale: Secondary | ICD-10-CM | POA: Diagnosis not present

## 2021-03-15 DIAGNOSIS — Z86718 Personal history of other venous thrombosis and embolism: Secondary | ICD-10-CM

## 2021-03-15 DIAGNOSIS — R918 Other nonspecific abnormal finding of lung field: Secondary | ICD-10-CM | POA: Diagnosis not present

## 2021-03-15 DIAGNOSIS — E119 Type 2 diabetes mellitus without complications: Secondary | ICD-10-CM | POA: Diagnosis present

## 2021-03-15 DIAGNOSIS — Z20822 Contact with and (suspected) exposure to covid-19: Secondary | ICD-10-CM | POA: Diagnosis not present

## 2021-03-15 DIAGNOSIS — C771 Secondary and unspecified malignant neoplasm of intrathoracic lymph nodes: Secondary | ICD-10-CM | POA: Diagnosis not present

## 2021-03-15 DIAGNOSIS — Z8249 Family history of ischemic heart disease and other diseases of the circulatory system: Secondary | ICD-10-CM

## 2021-03-15 DIAGNOSIS — R29701 NIHSS score 1: Secondary | ICD-10-CM | POA: Diagnosis not present

## 2021-03-15 DIAGNOSIS — I358 Other nonrheumatic aortic valve disorders: Secondary | ICD-10-CM | POA: Diagnosis not present

## 2021-03-15 DIAGNOSIS — I639 Cerebral infarction, unspecified: Secondary | ICD-10-CM | POA: Diagnosis not present

## 2021-03-15 DIAGNOSIS — J309 Allergic rhinitis, unspecified: Secondary | ICD-10-CM | POA: Diagnosis present

## 2021-03-15 DIAGNOSIS — R630 Anorexia: Secondary | ICD-10-CM | POA: Diagnosis present

## 2021-03-15 DIAGNOSIS — H5461 Unqualified visual loss, right eye, normal vision left eye: Secondary | ICD-10-CM | POA: Diagnosis not present

## 2021-03-15 DIAGNOSIS — I6389 Other cerebral infarction: Secondary | ICD-10-CM | POA: Diagnosis not present

## 2021-03-15 DIAGNOSIS — I6340 Cerebral infarction due to embolism of unspecified cerebral artery: Secondary | ICD-10-CM | POA: Insufficient documentation

## 2021-03-15 DIAGNOSIS — R29818 Other symptoms and signs involving the nervous system: Secondary | ICD-10-CM | POA: Diagnosis not present

## 2021-03-15 DIAGNOSIS — Z7984 Long term (current) use of oral hypoglycemic drugs: Secondary | ICD-10-CM | POA: Diagnosis not present

## 2021-03-15 DIAGNOSIS — I2510 Atherosclerotic heart disease of native coronary artery without angina pectoris: Secondary | ICD-10-CM | POA: Diagnosis not present

## 2021-03-15 DIAGNOSIS — Z86711 Personal history of pulmonary embolism: Secondary | ICD-10-CM

## 2021-03-15 DIAGNOSIS — I3139 Other pericardial effusion (noninflammatory): Secondary | ICD-10-CM | POA: Diagnosis not present

## 2021-03-15 DIAGNOSIS — I82453 Acute embolism and thrombosis of peroneal vein, bilateral: Secondary | ICD-10-CM | POA: Diagnosis not present

## 2021-03-15 DIAGNOSIS — Z9841 Cataract extraction status, right eye: Secondary | ICD-10-CM | POA: Diagnosis not present

## 2021-03-15 DIAGNOSIS — C801 Malignant (primary) neoplasm, unspecified: Secondary | ICD-10-CM | POA: Diagnosis not present

## 2021-03-15 DIAGNOSIS — E875 Hyperkalemia: Secondary | ICD-10-CM | POA: Diagnosis not present

## 2021-03-15 DIAGNOSIS — I82433 Acute embolism and thrombosis of popliteal vein, bilateral: Secondary | ICD-10-CM | POA: Diagnosis present

## 2021-03-15 DIAGNOSIS — Z6821 Body mass index (BMI) 21.0-21.9, adult: Secondary | ICD-10-CM

## 2021-03-15 DIAGNOSIS — Z7902 Long term (current) use of antithrombotics/antiplatelets: Secondary | ICD-10-CM

## 2021-03-15 DIAGNOSIS — I82443 Acute embolism and thrombosis of tibial vein, bilateral: Secondary | ICD-10-CM | POA: Diagnosis present

## 2021-03-15 DIAGNOSIS — Z5181 Encounter for therapeutic drug level monitoring: Secondary | ICD-10-CM | POA: Diagnosis not present

## 2021-03-15 DIAGNOSIS — G459 Transient cerebral ischemic attack, unspecified: Secondary | ICD-10-CM | POA: Diagnosis not present

## 2021-03-15 DIAGNOSIS — Z7901 Long term (current) use of anticoagulants: Secondary | ICD-10-CM

## 2021-03-15 DIAGNOSIS — Z888 Allergy status to other drugs, medicaments and biological substances status: Secondary | ICD-10-CM

## 2021-03-15 DIAGNOSIS — R599 Enlarged lymph nodes, unspecified: Secondary | ICD-10-CM | POA: Insufficient documentation

## 2021-03-15 DIAGNOSIS — R590 Localized enlarged lymph nodes: Secondary | ICD-10-CM | POA: Diagnosis not present

## 2021-03-15 DIAGNOSIS — Z955 Presence of coronary angioplasty implant and graft: Secondary | ICD-10-CM | POA: Diagnosis not present

## 2021-03-15 DIAGNOSIS — C3490 Malignant neoplasm of unspecified part of unspecified bronchus or lung: Secondary | ICD-10-CM | POA: Diagnosis present

## 2021-03-15 DIAGNOSIS — H547 Unspecified visual loss: Secondary | ICD-10-CM | POA: Diagnosis not present

## 2021-03-15 DIAGNOSIS — G936 Cerebral edema: Secondary | ICD-10-CM | POA: Diagnosis not present

## 2021-03-15 DIAGNOSIS — I63432 Cerebral infarction due to embolism of left posterior cerebral artery: Secondary | ICD-10-CM | POA: Diagnosis not present

## 2021-03-15 LAB — GLUCOSE, CAPILLARY
Glucose-Capillary: 138 mg/dL — ABNORMAL HIGH (ref 70–99)
Glucose-Capillary: 132 mg/dL — ABNORMAL HIGH (ref 70–99)

## 2021-03-15 MED ORDER — INSULIN ASPART 100 UNIT/ML IJ SOLN: 0.0000 [IU] | Freq: Every day | INTRAMUSCULAR | Status: DC

## 2021-03-15 MED ORDER — ROSUVASTATIN CALCIUM 20 MG PO TABS
20.0000 mg | ORAL_TABLET | Freq: Every day | ORAL | Status: DC
  Administered 2021-03-16 – 2021-03-18 (×3): 20 mg via ORAL
  Filled 2021-03-15 (×3): qty 1

## 2021-03-15 MED ORDER — LOSARTAN POTASSIUM 50 MG PO TABS
100.0000 mg | ORAL_TABLET | Freq: Every day | ORAL | Status: DC
  Administered 2021-03-15 – 2021-03-16 (×2): 100 mg via ORAL
  Filled 2021-03-15 (×2): qty 2

## 2021-03-15 MED ORDER — COLCHICINE 0.6 MG PO TABS
0.6000 mg | ORAL_TABLET | Freq: Two times a day (BID) | ORAL | Status: DC
  Administered 2021-03-15 – 2021-03-20 (×10): 0.6 mg via ORAL
  Filled 2021-03-15 (×10): qty 1

## 2021-03-15 MED ORDER — ACETAMINOPHEN 650 MG RE SUPP: 650.0000 mg | Freq: Four times a day (QID) | RECTAL | Status: DC | PRN

## 2021-03-15 MED ORDER — ATENOLOL 25 MG PO TABS
50.0000 mg | ORAL_TABLET | Freq: Two times a day (BID) | ORAL | Status: DC
  Administered 2021-03-15 – 2021-03-17 (×4): 50 mg via ORAL
  Filled 2021-03-15 (×4): qty 2

## 2021-03-15 MED ORDER — POLYETHYLENE GLYCOL 3350 17 G PO PACK: 17.0000 g | PACK | Freq: Every day | ORAL | Status: DC | PRN

## 2021-03-15 MED ORDER — ALBUTEROL SULFATE (2.5 MG/3ML) 0.083% IN NEBU: 3.0000 mL | INHALATION_SOLUTION | RESPIRATORY_TRACT | Status: DC | PRN

## 2021-03-15 MED ORDER — APIXABAN 5 MG PO TABS
5.0000 mg | ORAL_TABLET | Freq: Two times a day (BID) | ORAL | Status: DC
Start: 2021-03-15 — End: 2021-03-16
  Administered 2021-03-15 – 2021-03-16 (×2): 5 mg via ORAL
  Filled 2021-03-15 (×2): qty 1

## 2021-03-15 MED ORDER — INSULIN ASPART 100 UNIT/ML IJ SOLN
0.0000 [IU] | Freq: Three times a day (TID) | INTRAMUSCULAR | Status: DC
  Administered 2021-03-16 (×2): 2 [IU] via SUBCUTANEOUS
  Administered 2021-03-16: 1 [IU] via SUBCUTANEOUS

## 2021-03-15 MED ORDER — HYDROCHLOROTHIAZIDE 12.5 MG PO TABS
12.5000 mg | ORAL_TABLET | Freq: Every day | ORAL | Status: DC
  Administered 2021-03-16 – 2021-03-17 (×2): 12.5 mg via ORAL
  Filled 2021-03-15 (×2): qty 1

## 2021-03-15 MED ORDER — SODIUM CHLORIDE 0.9 % IV SOLN
0.7500 ug/kg/min | INTRAVENOUS | Status: AC
  Administered 2021-03-16 – 2021-03-20 (×7): 0.75 ug/kg/min via INTRAVENOUS
  Filled 2021-03-15 (×9): qty 50

## 2021-03-15 MED ORDER — ADULT MULTIVITAMIN W/MINERALS CH
1.0000 | ORAL_TABLET | Freq: Every day | ORAL | Status: DC
  Administered 2021-03-16 – 2021-03-22 (×7): 1 via ORAL
  Filled 2021-03-15 (×7): qty 1

## 2021-03-15 MED ORDER — ACETAMINOPHEN 325 MG PO TABS: 650.0000 mg | ORAL_TABLET | Freq: Four times a day (QID) | ORAL | Status: DC | PRN

## 2021-03-15 NOTE — Progress Notes (Signed)
ANTICOAGULATION/ANTIPLATELET CONSULT NOTE  Pharmacy Consult for Cangrelor Indication: Recent stent placement   Allergies  Allergen Reactions   Ace Inhibitors     REACTION: Cough    Patient Measurements:    Vital Signs: Temp: 98.4 F (36.9 C) (02/16 1716) Temp Source: Oral (02/16 1716) BP: 132/81 (02/16 1716) Pulse Rate: 72 (02/16 1716)  Labs: No results for input(s): HGB, HCT, PLT, APTT, LABPROT, INR, HEPARINUNFRC, HEPRLOWMOCWT, CREATININE, CKTOTAL, CKMB, TROPONINIHS in the last 72 hours.  CrCl cannot be calculated ([name] most recent lab result is older than the maximum 21 days allowed.).   Medical History: Past Medical History:  Diagnosis Date   Diabetes mellitus without complication ([place])    High cholesterol    Hypertension    Normal nuclear stress test 2010   stress perfusion study apparently in 2010 in [facility] which he said was negative.     Assessment: 81 yo male admitted on 03/15/2021. Patient with recent DES to mid LAD on 02/06/21. Plan for bronchoscopy on 03/21/2021 due to nuclear medicine pet imaging concerning for carcinoma. Prior to admission patient is on clopidogrel for recent DES and apixaban for PE. Pharmacy consulted to start cangrelor on 2/17 AM. Plan for IV heparin 2 days prior to bronch. Reported last dose 2/16 AM of apixaban and clopidogrel per patient's son.   Goal of Therapy:  Monitor platelets by anticoagulation protocol: Yes   Plan:  Start cangrelor 0.75 mcg/kg/min (no bolus) at 1000 on 2/17 Monitor CBC and s/s of bleeding  Follow AC plans - apixaban currently not ordered inpatient, will follow up with family medicine team  Follow up transition to heparin 2 days prior to [place], PharmD, BCPS Clinical Pharmacist 03/15/2021 7:19 PM

## 2021-03-15 NOTE — Progress Notes (Signed)
Notified resident on call that pt has arrived to unit. No orders at this time. Pt is awake and alert at baseline; vital signs stable on RA. No distress or discomfort noted.

## 2021-03-15 NOTE — Progress Notes (Signed)
Physician at bedside for admission assessment. RN request for cardiac monitoring; MD agrees. Will obtain a CBG at this time d/t pmhx of DM 2. IV team consult in place.  MD states pt able to eat; awaiting diet orders at this time.

## 2021-03-15 NOTE — Progress Notes (Addendum)
PCCM:  Patient to be admitted for plavix washout and cangrelor infusion due to recent DES placement and planned bronchoscopy on 03/21/2021.  Consult placed to pharmacy for transition.   I appreciate the help from Dr. Erin Hearing and the FPTS for admission and caring for this gentleman.   Please consult inpatient cardiology service tomorrow AM for any recs.   [facility] 03/15/2021 6:53 PM

## 2021-03-15 NOTE — Progress Notes (Signed)
Daughter requested bed alarm for pt. Bed alarm turned on and RN notified.

## 2021-03-15 NOTE — H&P (Signed)
[facility] Admission History and Physical Service Pager: [phone]  Patient name: Arthur [name] Medical record number: [id number] Date of birth: Jun 09, 1940 Age: 81 y.o. Gender: male  Primary Care Provider: Lind Covert, MD Consultants: PCCM Code Status: FULL  Chief Complaint: pulmonary nodules  Assessment and Plan: Arthur [name] is a 81 y.o. male presenting for planned admission for reversible anticoagulant therapy to have lung biopsy for suspicious pulmonary nodules. PMH is significant for cardiac arrest, CAD s/p PCI, PE on apixaban, gout, HTN, T2DM.  Pulmonary nodules Elective admission due to need for reversible anticoagulant therapy to have lung biopsy done for further workup of suspicious pulmonary nodules.  He has had a PET scan concerning for advanced age bronchogenic carcinoma.  He had a recent complicated hospitalization 1 month ago for pulmonary embolism complicated by cardiac arrest and ultimately underwent PCI with DES.  Therefore, he needs washout of clopidogrel and apixaban prior to procedure. - admit to [facility]PT[facility], med-tele, Dr. McDiarmid attending - pulmonology following, appreciate recommendations - plan for biopsy 2/22 - will involve cardiology, plan to consult in the AM - pharmacy consulted to assist with medication transition - hold clopidogrel, plan to start IV cangrelor tomorrow - continue apixaban, plan to transition to IV heparin 2 days prior to procedure  CAD S/p PCI with DES 01/2021 - antiplatelet plan as above - continue statin  History of recent PE 01/2021 - anticoagulation plan as above  Gout Diagnosed in office by PCP 2 days prior in the setting of medication nonadherence.  On colchicine, appears to be improving per patient. - continue colchicine 0.6 mg BID, will taper to 0.6 mg daily in the next 1-2 days - hold allopurinol  T2DM - hold metformin - sSSI - monitor CBG  HTN Well controlled currently. -  continue home atenolol, HCTZ, losartan  FEN/GI: regular diet Prophylaxis: apixaban  Disposition: med-tele  History of Present Illness:  Arthur [name] is a 81 y.o. male presenting for elective admission for antiplatelet and anticoagulation washout for elective lung biopsy for suspicious pulmonary nodules.  He reports no concerns at this time.  He has been treated for gout with colchicine which she states has been improving.  Review Of Systems: Per HPI with the following additions:   Review of Systems  Constitutional:  Negative for chills and fever.  Respiratory:  Negative for shortness of breath.    Patient Active Problem List   Diagnosis Date Noted   Adenopathy 03/06/2021   Lung mass 03/02/2021   History of cardiac arrest 02/19/2021   Status post coronary artery stent placement    Coronary artery disease involving native heart    Cardiac arrest ([place]) 02/04/2021   Pulmonary nodules/lesions, multiple    Pulmonary embolus ([place]) 02/03/2021   Cerumen impaction 01/16/2021   BPPV (benign paroxysmal positional vertigo) 01/16/2021   Allergic rhinitis 06/06/2020   Right calf pain 02/13/2020   Anemia 09/06/2015   History of pulmonary embolism    Recurrent cough 03/15/2014   GERD (gastroesophageal reflux disease) 07/05/2013   Arthritis 02/26/2010   Gout 11/06/2009   Diabetes mellitus, type II ([place]) 09/22/2009   Hyperlipidemia 09/22/2009   Essential hypertension, benign 09/22/2009    Past Medical History: Past Medical History:  Diagnosis Date   Diabetes mellitus without complication ([place])    High cholesterol    Hypertension    Normal nuclear stress test 2010   stress perfusion study apparently in 2010 in [facility] which he said was negative.  Past Surgical History: Past Surgical History:  Procedure Laterality Date   CATARACT EXTRACTION, BILATERAL  2006   CORONARY STENT INTERVENTION N/A 02/06/2021   Procedure: CORONARY STENT INTERVENTION;  Surgeon: Jettie Booze, MD;  Location: [facility];  Service: Cardiovascular;  Laterality: N/A;   INTRAVASCULAR ULTRASOUND/IVUS N/A 02/06/2021   Procedure: Intravascular Ultrasound/IVUS;  Surgeon: Jettie Booze, MD;  Location: [facility];  Service: Cardiovascular;  Laterality: N/A;   KNEE SURGERY  2005   LEFT HEART CATH AND CORONARY ANGIOGRAPHY N/A 02/06/2021   Procedure: LEFT HEART CATH AND CORONARY ANGIOGRAPHY;  Surgeon: Jettie Booze, MD;  Location: [facility];  Service: Cardiovascular;  Laterality: N/A;    Social History: Social History   Tobacco Use   Smoking status: Never   Smokeless tobacco: Never  Substance Use Topics   Alcohol use: No   Drug use: No   Additional social history:   Please also refer to relevant sections of EMR.  Family History: Family History  Problem Relation Age of Onset   Coronary artery disease Father    Coronary artery disease Mother     Allergies and Medications: Allergies  Allergen Reactions   Ace Inhibitors     REACTION: Cough   No current facility-administered medications on file prior to encounter.   Current Outpatient Medications on File Prior to Encounter  Medication Sig Dispense Refill   acetaminophen (TYLENOL) 650 MG CR tablet Take 650 mg by mouth every 8 (eight) hours as needed for pain.     albuterol (PROAIR HFA) 108 (90 Base) MCG/ACT inhaler Inhale 2 puffs into the lungs every 4 (four) hours as needed for wheezing or shortness of breath. 18 g 0   apixaban (ELIQUIS) 5 MG TABS tablet Take 1 tablet (5 mg total) by mouth 2 (two) times daily. 60 tablet 11   atenolol (TENORMIN) 50 MG tablet Take 1 tablet (50 mg total) by mouth 2 (two) times daily. 180 tablet 3   benzonatate (TESSALON) 100 MG capsule TAKE 1 CAPSULE BY MOUTH TWICE DAILY AS NEEDED FOR COUGH (Patient taking differently: Take 100 mg by mouth 3 (three) times daily as needed for cough.) 15 capsule 0   clopidogrel (PLAVIX) 75 MG tablet Take 1 tablet (75 mg  total) by mouth daily with breakfast. 30 tablet 11   diclofenac Sodium (VOLTAREN) 1 % GEL Apply 2 g topically 4 (four) times daily. (Patient taking differently: Apply 2-4 g topically 4 (four) times daily as needed (knees/legs).) 50 g 1   ferrous sulfate 324 (65 Fe) MG TBEC Take 324 mg by mouth daily. 30 tablet    hydrochlorothiazide (HYDRODIURIL) 12.5 MG tablet Take 1 tablet (12.5 mg total) by mouth daily. 90 tablet 3   losartan (COZAAR) 100 MG tablet Take 1 tablet (100 mg total) by mouth at bedtime. 90 tablet 3   metFORMIN (GLUCOPHAGE) 1000 MG tablet Take 1 tablet (1,000 mg total) by mouth 2 (two) times daily with a meal. 180 tablet 3   Multiple Vitamins-Minerals (CENTRUM SILVER 50+MEN) TABS Take 1 tablet by mouth daily.     rosuvastatin (CRESTOR) 20 MG tablet Take 1 tablet (20 mg total) by mouth daily. 90 tablet 2   trolamine salicylate (ASPER-FLEX) 10 % cream Apply 1 application topically as needed for muscle pain. 85 g 0    Objective: BP 133/61 (BP Location: Right Arm)    Pulse 74    Temp 98.1 F (36.7 C) (Oral)    Resp 18  SpO2 99%  Exam: General: Alert, healthy-appearing elderly male, NAD Eyes: PERRL, EOMI ENTM: MMM Neck: Supple Cardiovascular: RRR, no murmurs Respiratory: Clear to auscultation bilaterally, no respiratory distress Gastrointestinal: Soft, nontender MSK: Moves all extremities Derm: Warm, dry Neuro: Alert and interactive Psych: Affect appropriate  Labs and Imaging: CBC BMET  No results for input(s): WBC, HGB, HCT, PLT in the last 168 hours. No results for input(s): NA, K, CL, CO2, BUN, CREATININE, GLUCOSE, CALCIUM in the last 168 hours.     Zola Button, MD 03/15/2021, 7:59 PM PGY-2, [place] Intern pager: [phone], text pages welcome

## 2021-03-16 DIAGNOSIS — Z5181 Encounter for therapeutic drug level monitoring: Secondary | ICD-10-CM

## 2021-03-16 DIAGNOSIS — I471 Supraventricular tachycardia: Secondary | ICD-10-CM

## 2021-03-16 DIAGNOSIS — Z7901 Long term (current) use of anticoagulants: Secondary | ICD-10-CM

## 2021-03-16 DIAGNOSIS — R918 Other nonspecific abnormal finding of lung field: Secondary | ICD-10-CM

## 2021-03-16 DIAGNOSIS — Z86711 Personal history of pulmonary embolism: Secondary | ICD-10-CM

## 2021-03-16 LAB — CBC
HCT: 35.3 % — ABNORMAL LOW (ref 39.0–52.0)
Hemoglobin: 11.1 g/dL — ABNORMAL LOW (ref 13.0–17.0)
MCH: 25.9 pg — ABNORMAL LOW (ref 26.0–34.0)
MCHC: 31.4 g/dL (ref 30.0–36.0)
MCV: 82.5 fL (ref 80.0–100.0)
Platelets: 281 10*3/uL (ref 150–400)
RBC: 4.28 MIL/uL (ref 4.22–5.81)
RDW: 13.7 % (ref 11.5–15.5)
WBC: 12.6 10*3/uL — ABNORMAL HIGH (ref 4.0–10.5)
nRBC: 0 % (ref 0.0–0.2)

## 2021-03-16 LAB — GLUCOSE, CAPILLARY
Glucose-Capillary: 153 mg/dL — ABNORMAL HIGH (ref 70–99)
Glucose-Capillary: 132 mg/dL — ABNORMAL HIGH (ref 70–99)
Glucose-Capillary: 174 mg/dL — ABNORMAL HIGH (ref 70–99)
Glucose-Capillary: 154 mg/dL — ABNORMAL HIGH (ref 70–99)

## 2021-03-16 LAB — COMPREHENSIVE METABOLIC PANEL
ALT: 14 U/L (ref 0–44)
AST: 18 U/L (ref 15–41)
Albumin: 2.6 g/dL — ABNORMAL LOW (ref 3.5–5.0)
Alkaline Phosphatase: 109 U/L (ref 38–126)
Anion gap: 9 (ref 5–15)
BUN: 21 mg/dL (ref 8–23)
CO2: 23 mmol/L (ref 22–32)
Calcium: 9.1 mg/dL (ref 8.9–10.3)
Chloride: 103 mmol/L (ref 98–111)
Creatinine, Ser: 1.24 mg/dL (ref 0.61–1.24)
GFR, Estimated: 59 mL/min — ABNORMAL LOW (ref >60)
Glucose, Bld: 114 mg/dL — ABNORMAL HIGH (ref 70–99)
Potassium: 4.4 mmol/L (ref 3.5–5.1)
Sodium: 135 mmol/L (ref 135–145)
Total Bilirubin: 0.6 mg/dL (ref 0.3–1.2)
Total Protein: 6.6 g/dL (ref 6.5–8.1)

## 2021-03-16 MED ORDER — APIXABAN 5 MG PO TABS
5.0000 mg | ORAL_TABLET | Freq: Two times a day (BID) | ORAL | Status: AC
  Administered 2021-03-16 – 2021-03-18 (×5): 5 mg via ORAL
  Filled 2021-03-16 (×5): qty 1

## 2021-03-16 NOTE — Consult Note (Addendum)
Cardiology Consultation:   Patient ID: Arthur [name] MRN: [id number]; DOB: 1940/03/22  Admit date: 03/15/2021 Date of Consult: 03/16/2021  PCP:  Lind Covert, MD   [facility] Providers Cardiologist:  Buford Dresser, MD   {  Patient Profile:   Arthur [name] is a 81 y.o. male with a history of CAD with recent cardiac arrest (VT >> PEA >> asystole) in 01/2021 s/p DES to LAD, recurrent DVT/PE on Eliquis, pulmonary nodule (presumed lung cancer), hypertension, hyperlipidemia, type 2 diabetes mellitus, and GERD who is being seen for assistance with antiplatelet therapy at the request of Dr. Erin Hearing.  History of Present Illness:   Arthur [name] is a 81 year old male with the above history who is followed by Dr. Harrell Gave. Patient was recently admitted from 02/03/2021 to 02/08/2021 after presenting with increased dyspnea on exertion. CTA showed PE within the distal right pulmonary artery, segmental, and subsegmental right upper and middle lobe as well as increased right lower lobe nodule of 33mm concerning for metastatic disease. He was started on IV Heparin and admitted but while waiting in the ED, he had a cardiac arrest after getting up to ambulate to the bathroom. He received CPR and epinephrine and ROSC was achieved after 5 minutes. Per review of telemetry, it looked like he had VT, then PEA, and asystole. He was started on IV Amiodarone. Echo showed LVEF of 50-55% with hypokinesis of the anterior septum, mid inferoseptal segment, basal inferior segment, and basal inferoseptal segment as well as grade 2 diastolic dysfunction, mildly reduced RV systolic function, mild MR, and severely elevated PASP of 66.4 mmHg. LHC on 02/06/2021 showed 80% stenosis of ostial to proximal LAD, 90% stenosis of 1st Diag, and 25% stenosis of mid LCX. Patient underwent successful PCI with DES to LAD lesion and balloon angioplasty of the 1st Diag lesion. He was started on Plavix and Eliquis. He was seen by  Dr. Harrell Gave on 02/19/2021 at which time he was recovering well.  Outpatient PET scan showed hypermetabolic lesions within a right lower lobe lung nodule with associated adenopathy of the chest concerning for primary lung malignancy with associated metastatic disease potentially to the T11 spine. Findings were consistent with advanced age bronchogenic carcinoma. It was not felt that he could wait 3 months or 6 months before getting a tissue biopsy. Therefore, the plan was to admit him for Plavix washout and Heparin and Cangrelor bridge before bronchoscopy on 03/21/2021. Patient presented to [facility] today for planned admission and Cardiology consulted to assist with antiplatelet therapy.  Patient has done well since his last visit with Dr. Harrell Gave. No chest pain, shortness of breath, orthopnea, PND, lower extremity, palpitations, lightheadedness, dizziness, syncope. He has some right ankle swelling/pain due to a gout flair but this is improving on Colchicine and Allopurinol. Family also reports poor appetite. No recent fevers or illnesses. No GI symptoms. No abnormal bleeding on Eliquis and Plavix.  Past Medical History:  Diagnosis Date   Diabetes mellitus without complication ([place])    High cholesterol    Hypertension    Normal nuclear stress test 2010   stress perfusion study apparently in 2010 in [facility] which he said was negative.    Past Surgical History:  Procedure Laterality Date   CATARACT EXTRACTION, BILATERAL  2006   CORONARY STENT INTERVENTION N/A 02/06/2021   Procedure: CORONARY STENT INTERVENTION;  Surgeon: Jettie Booze, MD;  Location: [employer] CV [facility];  Service: Cardiovascular;  Laterality: N/A;   INTRAVASCULAR ULTRASOUND/IVUS  N/A 02/06/2021   Procedure: Intravascular Ultrasound/IVUS;  Surgeon: Jettie Booze, MD;  Location: [place] [facility];  Service: Cardiovascular;  Laterality: N/A;   KNEE SURGERY  2005   LEFT HEART CATH AND CORONARY ANGIOGRAPHY  N/A 02/06/2021   Procedure: LEFT HEART CATH AND CORONARY ANGIOGRAPHY;  Surgeon: Jettie Booze, MD;  Location: [place] [facility];  Service: Cardiovascular;  Laterality: N/A;     Home Medications:  Prior to Admission medications   Medication Sig Start Date End Date Taking? Authorizing Provider  acetaminophen (TYLENOL) 650 MG CR tablet Take 650 mg by mouth every 8 (eight) hours as needed for pain.   Yes [provider]  albuterol (PROAIR HFA) 108 (90 Base) MCG/ACT inhaler Inhale 2 puffs into the lungs every 4 (four) hours as needed for wheezing or shortness of breath. 02/08/21  Yes Autry-Lott, Naaman Plummer, DO  allopurinol (ZYLOPRIM) 100 MG tablet Take 1 tablet (100 mg total) by mouth daily. 03/13/21  Yes Chambliss, Jeb Levering, MD  apixaban (ELIQUIS) 5 MG TABS tablet Take 1 tablet (5 mg total) by mouth 2 (two) times daily. 02/14/21  Yes Eppie Gibson, MD  atenolol (TENORMIN) 50 MG tablet Take 1 tablet (50 mg total) by mouth 2 (two) times daily. 01/31/21  Yes Chambliss, Jeb Levering, MD  benzonatate (TESSALON) 100 MG capsule TAKE 1 CAPSULE BY MOUTH TWICE DAILY AS NEEDED FOR COUGH Patient taking differently: Take 100 mg by mouth 3 (three) times daily as needed for cough. 02/01/21  Yes Lind Covert, MD  clopidogrel (PLAVIX) 75 MG tablet Take 1 tablet (75 mg total) by mouth daily with breakfast. 02/14/21  Yes Eppie Gibson, MD  colchicine 0.6 MG tablet Take 1 tablet (0.6 mg total) by mouth daily. Patient taking differently: Take 0.6 mg by mouth every 12 (twelve) hours. 03/13/21  Yes Chambliss, Jeb Levering, MD  diclofenac Sodium (VOLTAREN) 1 % GEL Apply 2 g topically 4 (four) times daily. Patient taking differently: Apply 2-4 g topically 4 (four) times daily as needed (knees/legs). 01/31/21  Yes Chambliss, Jeb Levering, MD  ferrous sulfate 324 (65 Fe) MG TBEC Take 324 mg by mouth daily. 08/17/20  Yes Lind Covert, MD  metFORMIN (GLUCOPHAGE) 1000 MG tablet Take 1 tablet (1,000 mg total)  by mouth 2 (two) times daily with a meal. 01/31/21  Yes Chambliss, Jeb Levering, MD  Multiple Vitamins-Minerals (CENTRUM SILVER 50+MEN) TABS Take 1 tablet by mouth daily.   Yes [provider]  rosuvastatin (CRESTOR) 20 MG tablet Take 1 tablet (20 mg total) by mouth daily. 03/02/21  Yes Lind Covert, MD  trolamine salicylate (ASPER-FLEX) 10 % cream Apply 1 application topically as needed for muscle pain. 04/08/18  Yes Chambliss, Jeb Levering, MD  hydrochlorothiazide (HYDRODIURIL) 12.5 MG tablet Take 1 tablet (12.5 mg total) by mouth daily. Patient not taking: Reported on 03/16/2021 02/14/21   Eppie Gibson, MD  losartan (COZAAR) 100 MG tablet Take 1 tablet (100 mg total) by mouth at bedtime. Patient not taking: Reported on 03/16/2021 02/14/21   Eppie Gibson, MD    Inpatient Medications: Scheduled Meds:  apixaban  5 mg Oral BID   atenolol  50 mg Oral BID   colchicine  0.6 mg Oral BID   hydrochlorothiazide  12.5 mg Oral Daily   insulin aspart  0-5 Units Subcutaneous QHS   insulin aspart  0-9 Units Subcutaneous TID WC   losartan  100 mg Oral QHS   multivitamin with minerals  1 tablet  Oral Daily   rosuvastatin  20 mg Oral Daily   Continuous Infusions:  cangrelor 50 mg in NS 250 mL 0.75 mcg/kg/min (03/16/21 1017)   PRN Meds: acetaminophen **OR** acetaminophen, albuterol, polyethylene glycol  Allergies:    Allergies  Allergen Reactions   Ace Inhibitors     REACTION: Cough    Social History:   Social History   Socioeconomic History   Marital status: Married    Spouse name: Not on file   Number of children: Not on file   Years of education: Not on file   Highest education level: Not on file  Occupational History   Not on file  Tobacco Use   Smoking status: Never   Smokeless tobacco: Never  Substance and Sexual Activity   Alcohol use: No   Drug use: No   Sexual activity: Not on file  Other Topics Concern   Not on file  Social History Narrative   Social  History:   He works part-time as a Doctor, general practice. He is married with 3 children. He's never smoked cigarettes and does not drink alcohol.            Social Determinants of Health   Financial Resource Strain: Not on file  Food Insecurity: Not on file  Transportation Needs: Not on file  Physical Activity: Not on file  Stress: Not on file  Social Connections: Not on file  Intimate Partner Violence: Not on file    Family History:   Family History  Problem Relation Age of Onset   Coronary artery disease Father    Coronary artery disease Mother      ROS:  Please see the history of present illness.  Review of Systems  Constitutional:  Negative for chills and fever.  HENT:  Negative for congestion.   Respiratory:  Negative for cough and shortness of breath.   Cardiovascular:  Negative for chest pain, palpitations, orthopnea, leg swelling and PND.  Gastrointestinal:  Negative for nausea and vomiting.  Genitourinary:  Negative for hematuria.  Musculoskeletal:  Positive for joint pain (right ankel from gout).  Neurological:  Negative for dizziness and loss of consciousness.  Endo/Heme/Allergies:  Does not bruise/bleed easily.  Psychiatric/Behavioral:  Negative for substance abuse.    Physical Exam/Data:   Vitals:   03/15/21 1944 03/15/21 2119 03/16/21 0541 03/16/21 0832  BP: 133/61  (!) 142/74 (!) 139/56  Pulse: 74  74 70  Resp: 18  19 18   Temp: 98.1 F (36.7 C)  98.2 F (36.8 C) 98 F (36.7 C)  TempSrc: Oral  Oral Oral  SpO2: 99%  98% 100%  Weight:  69.5 kg    Height:  5\' 11"  (1.803 m)      Intake/Output Summary (Last 24 hours) at 03/16/2021 1407 Last data filed at 03/15/2021 2119 Gross per 24 hour  Intake 240 ml  Output --  Net 240 ml   Last 3 Weights 03/15/2021 03/13/2021 03/01/2021  Weight (lbs) 153 lb 4.8 oz (No Data) 156 lb 6.4 oz  Weight (kg) 69.536 kg (No Data) 70.943 kg     Body mass index is 21.38 kg/m.  General: 81 y.o. male resting comfortably in no acute  distress. HEENT: Normocephalic and atraumatic. Sclera clear.  Neck: Supple. No carotid bruits. No JVD. Heart: RRR. Distinct S1 and S2. No murmurs, gallops, or rubs. Radial pulses 2+ and equal bilaterally. Lungs: No increased work of breathing. Clear to ausculation bilaterally. No wheezes, rhonchi, or rales.  Abdomen: Soft, non-distended, and non-tender to  palpation.  MSK: Normal strength and tone for age. Extremities: No lower extremity edema.    Skin: Warm and dry. Neuro: Alert and oriented x3. No focal deficits. Psych: Normal affect. Responds appropriately.   EKG:  The EKG was personally reviewed and demonstrates:  No ECG this admission. Last EKG from 02/08/2021 personally reviewed and shows normal sinus rhythm, rate 69 bpm, with 1st degree AV block and 79mm ST elevation in leads V2-V3 (does not meet criteria for STEMI).  Telemetry:  Telemetry was personally reviewed and demonstrates: Normal sinus rhythm with rates in the 60s to 70s. One brief run of what looks like PAT with rates in the 120s.  Relevant CV Studies:  Echocardiogram 02/06/2021: Impressions:  1. Left ventricular ejection fraction, by estimation, is 50 to 55%. The  left ventricle has low normal function. The left ventricle demonstrates  regional wall motion abnormalities (see scoring diagram/findings for  description). There is moderate  asymmetric left ventricular hypertrophy of the basal-septal segment. Left  ventricular diastolic parameters are consistent with Grade II diastolic  dysfunction (pseudonormalization). There is the interventricular septum is  flattened in systole, consistent  with right ventricular pressure overload.   2. Right ventricular systolic function is mildly reduced. The right  ventricular size is normal. There is severely elevated pulmonary artery  systolic pressure. The estimated right ventricular systolic pressure is  45.4 mmHg.   3. Right atrial size was mildly dilated.   4. There is no  evidence of cardiac tamponade.   5. The mitral valve is grossly normal. Mild mitral valve regurgitation.  No evidence of mitral stenosis.   6. The aortic valve was not well visualized. There is moderate  calcification of the aortic valve. There is moderate thickening of the  aortic valve. Aortic valve regurgitation is not visualized. Mild aortic  valve stenosis.   7. The inferior vena cava is normal in size with <50% respiratory  variability, suggesting right atrial pressure of 8 mmHg.   Comparison(s): Changes from prior study are noted.  _______________  Left Cardiac Catheterization 02/06/2021:   Ost LAD to Prox LAD lesion is 80% stenosed.   A drug-eluting stent was successfully placed using a STENT ONYX FRONTIER 3.5X15, postdailted to > 4 mm and optimized with IVUS.   Post intervention, there is a 0% residual stenosis.   1st Diag lesion is 90% stenosed.   Balloon angioplasty was performed through the stent struts to the jailed 1st diagonal using a BALLN SAPPHIRE 2.0X12.   Post intervention, there is a 40% residual stenosis.   Mid Cx lesion is 25% stenosed.   LV end diastolic pressure is normal.   There is no aortic valve stenosis.   From the right radial, it was difficult to not selectively engage the circumflex due to the left main being short.  EBU 3 guide catheter was used by anchoring the guide using circumflex wire, pulling back the guide, and then wiring the LAD.   Complex, proximal LAD lesion.  Due to high risk location, this needed to be treated.  Successful IVUS guided stenting of the proximal LAD.  PTCA of the diagonal which was jailed.  Patient tolerated the procedure well.   Resume heparin 6 hours after sheath removal.  Clopidogrel was used since he will likely be going home on Eliquis or Xarelto.  No aspirin to reduce bleeding risk.  Diagnostic Dominance: Right    Laboratory Data:  High Sensitivity Troponin:  No results for input(s): TROPONINIHS in the last 720  hours.  Chemistry Recent Labs  Lab 03/16/21 0435  NA 135  K 4.4  CL 103  CO2 23  GLUCOSE 114*  BUN 21  CREATININE 1.24  CALCIUM 9.1  GFRNONAA 59*  ANIONGAP 9    Recent Labs  Lab 03/16/21 0435  PROT 6.6  ALBUMIN 2.6*  AST 18  ALT 14  ALKPHOS 109  BILITOT 0.6   Lipids No results for input(s): CHOL, TRIG, HDL, LABVLDL, LDLCALC, CHOLHDL in the last 168 hours.  Hematology Recent Labs  Lab 03/16/21 0435  WBC 12.6*  RBC 4.28  HGB 11.1*  HCT 35.3*  MCV 82.5  MCH 25.9*  MCHC 31.4  RDW 13.7  PLT 281   Thyroid No results for input(s): TSH, FREET4 in the last 168 hours.  BNPNo results for input(s): BNP, PROBNP in the last 168 hours.  DDimer No results for input(s): DDIMER in the last 168 hours.   Radiology/Studies:  No results found.   Assessment and Plan:   CAD with Recent Cardiac Arrest Patient was admitted last month with acute PE after presenting with progressive shortness of breath and then had a cardiac arrest (VT >> PEA >> asystole) while waiting for a bed in the ED. LHC showed 80% stenosis of ostial to proximal LAD, 90% stenosis of 1st Diag, and 25% stenosis of mid LCX. Patient underwent successful PCI with DES to LAD lesion and balloon angioplasty of the 1st Diag lesion. He was started on Plavix and Eliquis. Patient also found to have a lung nodule during that admission concerning for lung cancer. Outpatient PET raised concern for advanced age bronchogenic carcinoma. It was not felt that he could wait 3 months or 6 months before getting a tissue biopsy. Therefore, the plan was to admit him for Plavix washout and Heparin and Cangrelor bridge before bronchoscopy on 03/21/2021. Patient here for planned admission and Cardiology asked to assist with antiplatelet therapy. - Patient doing well from a cardiac standpoint with no symptoms.  - Will repeat EKG so we have an updated one this admission. - Plavix has already been stopped and he has been started on IV Cangrelor.  Appreciate Pharmacy's help with this. Will also discuss with MD. - Continue beta-blocker and high-intensity statin.  Recurrent PE/DVT PE diagnosed during recent admission. Felt to be secondary to hypercoagulable state from underlying malignancy. - Currently on Eliquis 5mg  twice daily but plan is to transition to IV Heparin 2 days prior to bronchoscopy.  Hypertension BP reasonably well controlled. - Continue current medications: Atenolol 50mg  twice daily, HCTZ 12.5mg  daily, Losartan 100mg  daily.  Hyperlipidemia Lipid panel on 02/06/2021: Total Cholesterol 133, Triglycerides 97, HDL 43, LDL 71.  - Patient was switched from Pravastatin to Crestor 20mg  at time of last check. - Will need repeat lipid panel and LFTs in 2-4 weeks.  Type 2 Diabetes Mellitus Hemoglobin A1c 6.3 in 12/2020. - Management per primary team.  Risk Assessment/Risk Scores:   For questions or updates, please contact [place] Please consult [email or web address] for contact info under    Signed, Darreld Mclean, PA-C  03/16/2021 2:07 PM  Patient seen and examined. Agree with assessment and plan.  Mr. Brigitte Pulse is an 81 year old gentleman who recently was diagnosed with acute pulmonary emboli on February 03, 2021 at the time was found to have a right lower lobe nodule of 18 mm concerning for metastatic disease.  While awaiting bed placement in the ED he had a cardiac arrest after getting up to ambulate and required CPR, epinephrine, and ROSC  was achieved after 5 minutes.  It was felt that he had ventricular tachycardia evolving into PEA and asystole.  He was successfully resuscitated and started on amiodarone intravenously.  EF was 50 to 55% on echo with hypokinesis of his anterior septum mid inferolateral septal, basal inferior and inferoseptal wall.  He had severely elevated pulmonary artery systolic pressure at 40.9 with mild reduction in RV function in the setting of his PE.  He was found to have high-grade ostial to proximal  LAD stenosis and underwent successful stenting with PTCA of his first diagonal vessel.  There was mild nonobstructive 25% stenosis in the circumflex.  He has been without anginal symptoms since.  He is in now need to undergo the following outpatient PET scan showing hypermetabolic lesions within the right lower lobe nodule with associated adenopathy and potential metastasis to T11 spine.  Since he has only been clopidogrel for 5 to 6 weeks in addition to Eliquis with his recent DES stent and PE, he is now brought into the hospital for medication transition.  His last dose of clopidogrel was yesterday.  He is now started on intravenous cangrelor to continue antiplatelet therapy.  He will continue Eliquis and hold for 2 days prior to his planned biopsies transition to heparin once Eliquis DC'd until his planned bronchoscopy on March 21, 2021.  On exam, he is in no acute distress.  Blood pressure is stable 139/56.  There is no JVD.  Lungs are clear without wheezing.  Rhythm is regular with a faint 1/6 systolic murmur.  Abdomen soft nontender.  There is no edema.  Neurologic exam is grossly nonfocal.  I reviewed his telemetry.  He is maintaining sinus rhythm but had a run of probable PAT at a rate of 120 bpm apparently his monitor was discontinued.  I would recommend he be placed back on telemetry.  He is on atenolol 50 mg twice a day, losartan 100 mg daily and HCTZ for hypertension.  He is on cangrelor drip with continued plans for Eliquis with holding 2 days prior to planned bronchoscopy.  If he develops recurrent PAT, may need slight titration of atenolol.    Troy Sine, MD, [facility] 03/16/2021 4:13 PM

## 2021-03-16 NOTE — Progress Notes (Signed)
[facility] admitting [name] for cangrelor infusion and plavix washout.  Let us know if PCCM can be of assistance.  Plan bronchoscopy on 2/22.  Roselie Awkward, MD [place] P[facility]M Pager: [phone] Cell: [phone] After 7:00 pm call [facility]  [phone]

## 2021-03-16 NOTE — Progress Notes (Signed)
FPTS Brief Note Reviewed patient's vitals, recent notes. On IV Cangrelor ahead of bronchoscopy on 2/22. PCCM and Cards are following. Appreciate their assistance.  Vitals:   03/16/21 1706 03/16/21 2058  BP: 138/60 134/72  Pulse: 72 71  Resp: 18 17  Temp: 98 F (36.7 C) 98.1 F (36.7 C)  SpO2: 99% 100%   At this time, no change in plan from day progress note.  Eppie Gibson, MD Page [phone] with questions about this patient.

## 2021-03-16 NOTE — Hospital Course (Addendum)
Arthur [name] is a 81 y.o. male presenting for planned admission for reversible anticoagulant therapy to have lung biopsy for suspicious pulmonary nodules. PMH is significant for cardiac arrest, CAD s/p PCI, PE on apixaban, gout, HTN, T2DM.  Pulmonary Nodules Patient presented for a planned admission for a Plavix washout and transition to reversible anticoagulation therapy for lung biopsy after a PET scan raised concern for metastatic lung disease. Patient underwent washout of clopidogrel and apixiban with close monitoring (due to recent PE complicated by cardiac arrest and subsequent PCI with DES). Patient transitioned to Heparin and Cangrelor. Shortly after the transition from Plavix to Cangrelor, and while the patient was still on Eliquis, he developed neurologic symptoms and was found to have an acute/subacute left PCA infarct as discussed below. He was stable on Cangrelor and Heparin drips and underwent lung biopsy on 2/22. He tolerated the procedure without issue.   Acute/subacute left PCA infarct Pt reported vision change in R eye, found to have loss of peripheral vision in R eye. CT head showed acute/subacute left PCA infarct, no hemorrhage, MRI/A showed acute left PCA infarct with other small infarcts (likely embolic), no LVO, no stenosis. DVT US was obtained which showed acute bilateral DVTs thought to be new since his last admission 6 weeks prior. Unfortunately, he did not have scans during that admission so we cannot say definitively that these were new clots. However, we are treating these clots as a failure of eliquis. His case was discussed with heme/onc who recommended transition to therapeutic Lovenox to treat VTE in the setting of likely cancer.    CAD   HTN S/p PCI with DES. Anticoagulation transitioned to reversible therapy for lung biopsy (as above). Patient discharged on Plavix and therapeutic Lovenox. [name] home antihypertensive regimen was atenolol 50mg  BID, HCTZ 12.5mg   daily, and Losartan 100mg  QHS. These were dose-reduced during this hospitalization and he was discharged on Atenolol 25mg  daily and Losartan 25mg  daily.   Gout Flare diagnosed prior to admission in the setting of nonadherence to allopurinol. He came in on colchicine which was dose-reduced during his stay first to 0.6mg  daily and then further to 0.3mg  daily prior to discharge.   Hyperkalemia Patient had a potassium of 5.6 on the day of discharge. We gave him a 1x dose of Lokelma prior to discharge and will have PCP follow this up closely.   Issues for follow-up: Lung biopsy will need to be followed up. His presentation raises concern for an aggressive malignancy and he may be appropriate for a palliative care consult. Patient was having some trouble with self-administering Lovenox dosing. Please ask if this has been an ongoing issue  Recommend that patient continue on BID Lovenox dosing until he has a chance to meet with heme/onc. Defer decision to adjust anticoagulation to them.  Recheck a BMP to assess K. Monitor gout symptoms, d/c colchicine when able.

## 2021-03-16 NOTE — Progress Notes (Signed)
Family Medicine Teaching Service Daily Progress Note Intern Pager: [phone]  Patient name: Arthur [name] Medical record number: [id number] Date of birth: 05/04/1940 Age: 81 y.o. Gender: male  Primary Care Provider: Lind Covert, MD Consultants: PCCM Code Status: Full  Pt Overview and Major Events to Date:  2/16-admitted  Assessment and Plan: Patient is an 81 year old male presenting for planned admission for reversible anticoagulant therapy to have lung biopsy for suspicious pulmonary nodules.  PMH is significant for cardiac arrest, CAD s/p PCI, PE on apixaban, gout, HTN, T2DM  Pulmonary nodules Patient is here for elective admission to get reversible anticoagulant therapy planning for lung biopsy to work-up suspicious pulmonary nodules.  PET scan was concerning for advanced stage bronchogenic carcinoma.  1 month ago patient was hospitalized for PE where he had cardiac arrest and underwent PCI w/ DES.  Home medications include clopidogrel and apixaban which need to be washed out prior to biopsy. -Pulmonology following and plan for biopsy on 2/22 -We will consult cardiology today -Pharmacology consulted -Hold clopidogrel and start IV cangrelor today -Continue apixaban and transition to IV heparin 2 days prior to procedure  Hx PE   CAD S/p PE, PCI with DES in January 2023 -Follow plan for antiplatelet therapy as mentioned above -Continue Crestor 20 mg daily  Gout -Colchicine 0.6 mg twice daily, taper 0.6 mg daily possibly tomorrow -Hold allopurinol  T2DM CBG today of 153 -Hold metformin -Sensitive SSI and monitor CBGs  HTN BP elevated to 142/74 today -Continue home atenolol 50 mg twice daily, HCTZ 12.5 mg daily, and losartan 100 mg daily   FEN/GI: Regular diet PPx: Apixaban Dispo: Pending lung biopsy  Subjective:  Patient was doing well and very pleasant to speak with.  No concerns or complaints at this time, does not make sure he gets his morning medications.     Objective: Temp:  [98.1 F (36.7 C)-98.4 F (36.9 C)] 98.2 F (36.8 C) (02/17 0541) Pulse Rate:  [72-74] 74 (02/17 0541) Resp:  [17-19] 19 (02/17 0541) BP: (132-142)/(61-81) 142/74 (02/17 0541) SpO2:  [98 %-100 %] 98 % (02/17 0541) Weight:  [69.5 kg] 69.5 kg (02/16 2119) Physical Exam: General: Patient sitting in his chair, smiling and pleasant, NAD Cardiovascular: RRR, normal S1/S2 Respiratory: CTAB, normal work of breathing   Laboratory: Recent Labs  Lab 03/16/21 0435  WBC 12.6*  HGB 11.1*  HCT 35.3*  PLT 281   Recent Labs  Lab 03/16/21 0435  NA 135  K 4.4  CL 103  CO2 23  BUN 21  CREATININE 1.24  CALCIUM 9.1  PROT 6.6  BILITOT 0.6  ALKPHOS 109  ALT 14  AST 18  GLUCOSE 114Precious Gilding, DO 03/16/2021, 8:06 AM PGY-1, [place] Intern pager: [phone], text pages welcome

## 2021-03-17 ENCOUNTER — Inpatient Hospital Stay (HOSPITAL_COMMUNITY): Payer: Medicare Other

## 2021-03-17 DIAGNOSIS — E1149 Type 2 diabetes mellitus with other diabetic neurological complication: Secondary | ICD-10-CM

## 2021-03-17 DIAGNOSIS — G459 Transient cerebral ischemic attack, unspecified: Secondary | ICD-10-CM

## 2021-03-17 DIAGNOSIS — R918 Other nonspecific abnormal finding of lung field: Secondary | ICD-10-CM | POA: Diagnosis not present

## 2021-03-17 DIAGNOSIS — H53461 Homonymous bilateral field defects, right side: Secondary | ICD-10-CM

## 2021-03-17 LAB — LDL CHOLESTEROL, DIRECT: Direct LDL: 75.9 mg/dL (ref 0–99)

## 2021-03-17 LAB — HEMOGLOBIN A1C
Hgb A1c MFr Bld: 6.2 % — ABNORMAL HIGH (ref 4.8–5.6)
Mean Plasma Glucose: 131.24 mg/dL

## 2021-03-17 LAB — GLUCOSE, CAPILLARY
Glucose-Capillary: 162 mg/dL — ABNORMAL HIGH (ref 70–99)
Glucose-Capillary: 149 mg/dL — ABNORMAL HIGH (ref 70–99)

## 2021-03-17 IMAGING — MR MR MRA HEAD W/O CM
4 series · 17 of 48 positions shown · IV contrast (gadavist)
Comparison: None.

CLINICAL DATA: Transient ischemic attack (TIA). Stroke, follow up.
Neuro deficit, acute, stroke. suspected R hemianopsia.

EXAM:
MRI HEAD WITHOUT AND WITH CONTRAST
MRA HEAD WITHOUT CONTRAST
MRA NECK WITHOUT AND WITH CONTRAST
TECHNIQUE: Multiplanar, multiecho pulse sequences of the brain and surrounding
structures were obtained without and with intravenous contrast.
Angiographic images of the Circle of Willis were obtained using MRA
technique without intravenous contrast. Angiographic images of the
neck were obtained using MRA technique without and with intravenous
contrast. Carotid stenosis measurements (when applicable) are
obtained utilizing NASCET criteria, using the distal internal
carotid diameter as the denominator.
CONTRAST:  7mL GADAVIST GADOBUTROL 1 MMOL/ML IV SOLN

[Series 2: ax (id) · axial · 1.0mm · 0.43mm/px · z∈[-103,-15]mm · 12 of 187 slices shown]
[im 1/187]
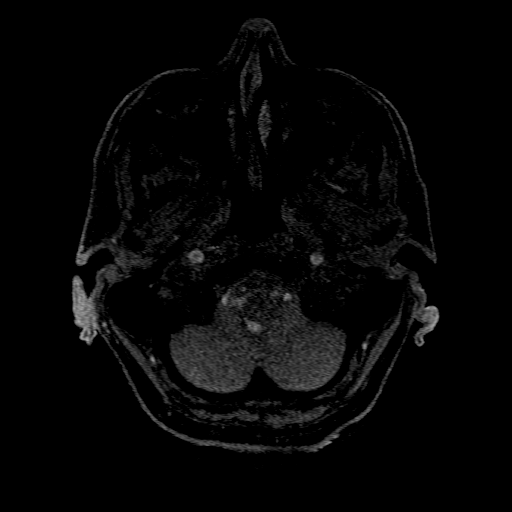
[im 9/187]
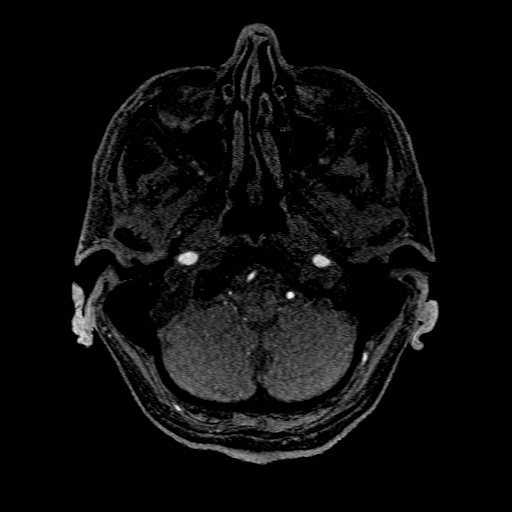
[im 32/187]
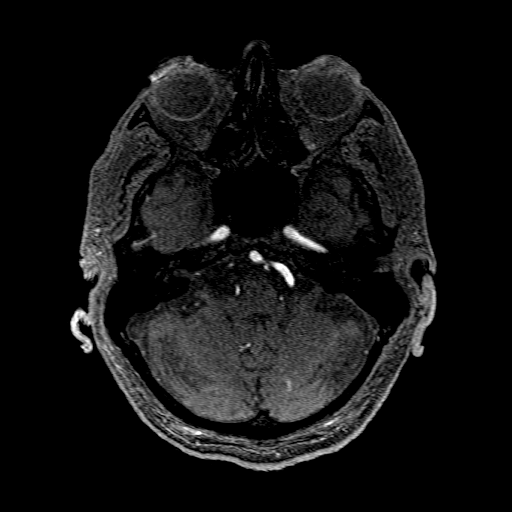
[im 36/187]
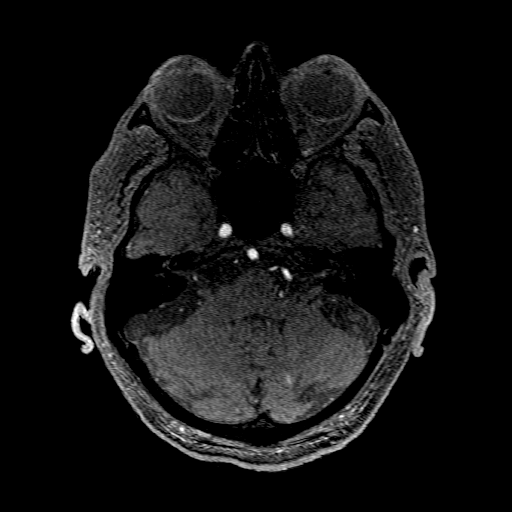
[im 58/187]
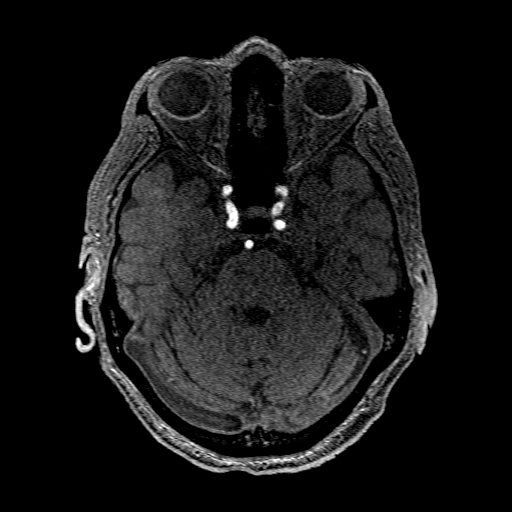
[im 80/187]
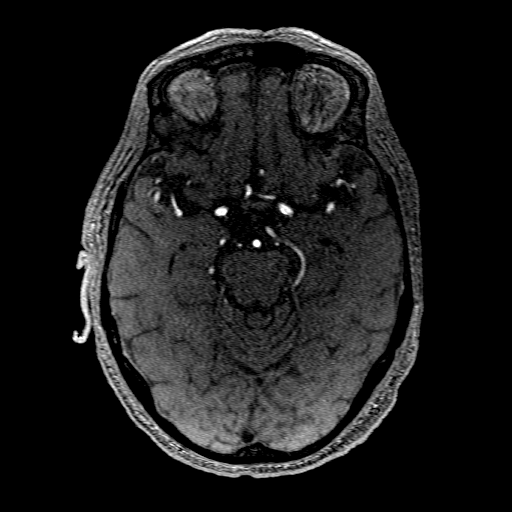
[im 94/187]
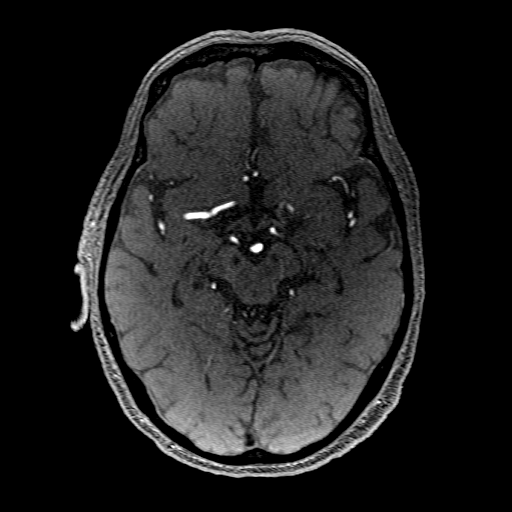
[im 107/187]
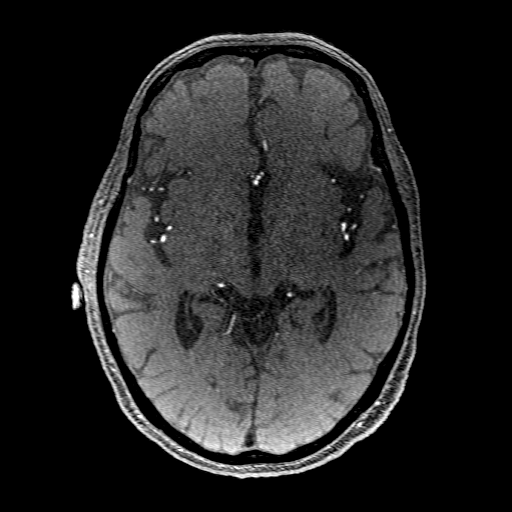
[im 129/187]
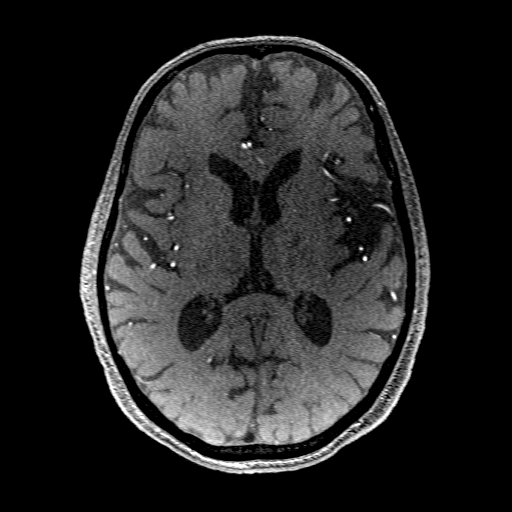
[im 151/187]
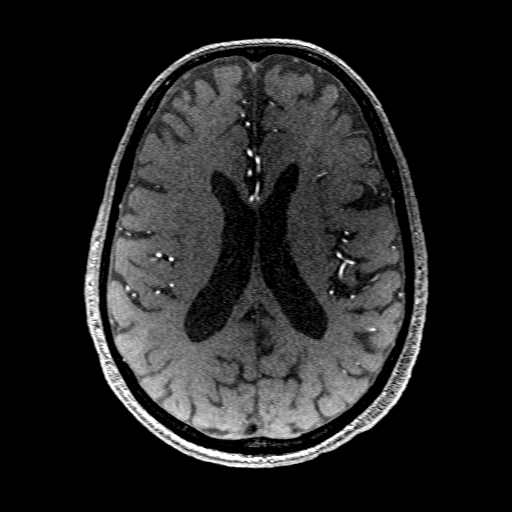
[im 156/187]
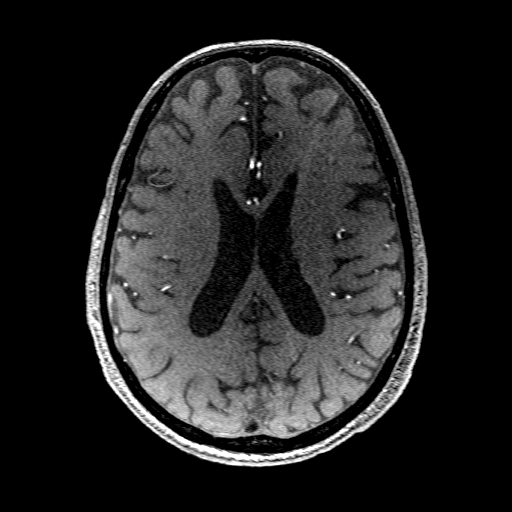
[im 178/187]
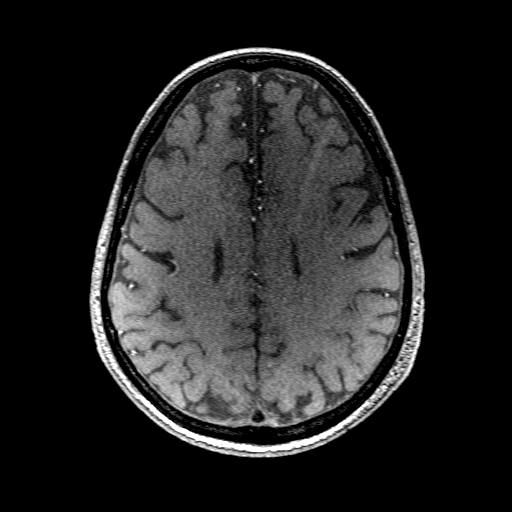

[Series 200: col:ax (id) · axial · 1.0mm · 0.43mm/px · 1 of 1 slices shown]
[im 1/1]
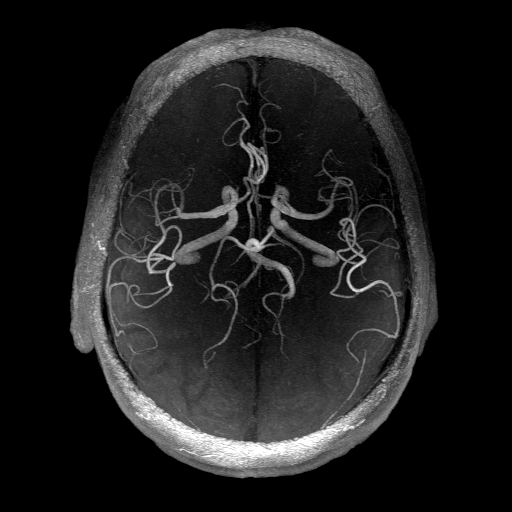

[Series 201: pjn:ax (id) · sagittal · 1.0mm · 0.43mm/px · 3 of 15 slices shown]
[im 1/15]
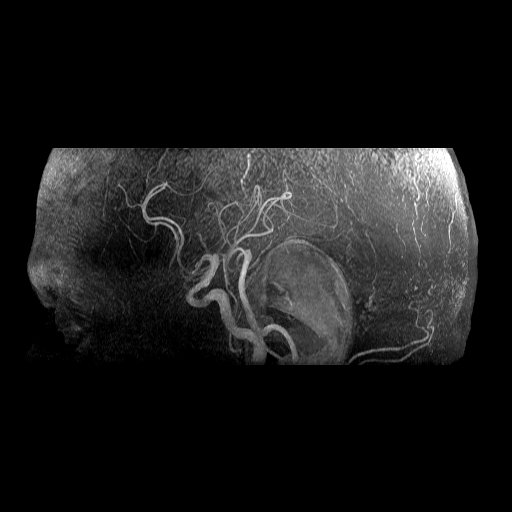
[im 8/15]
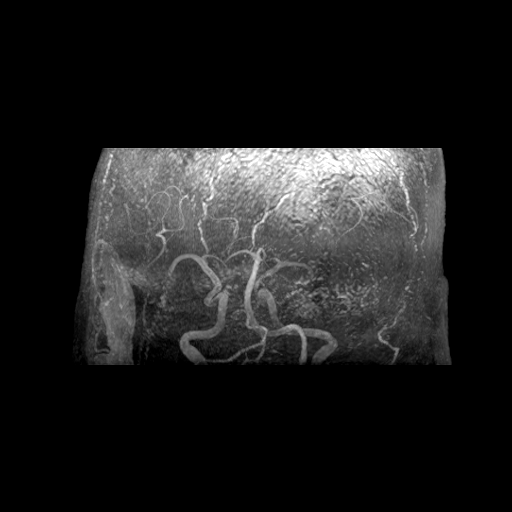
[im 15/15]
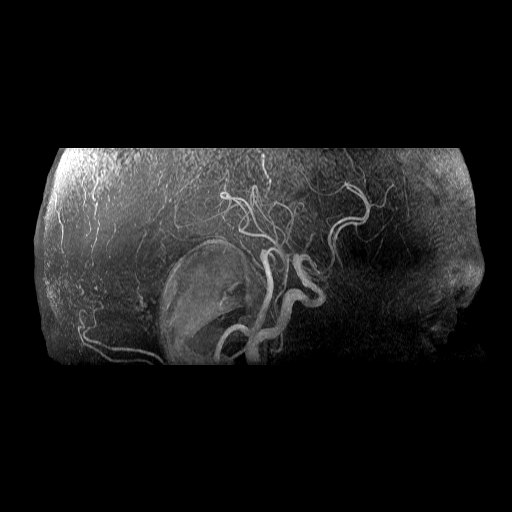

[Series 252: post. tumble · axial · 1.0mm · 0.39mm/px · 1 of 3 slices shown]
[im 1/3]
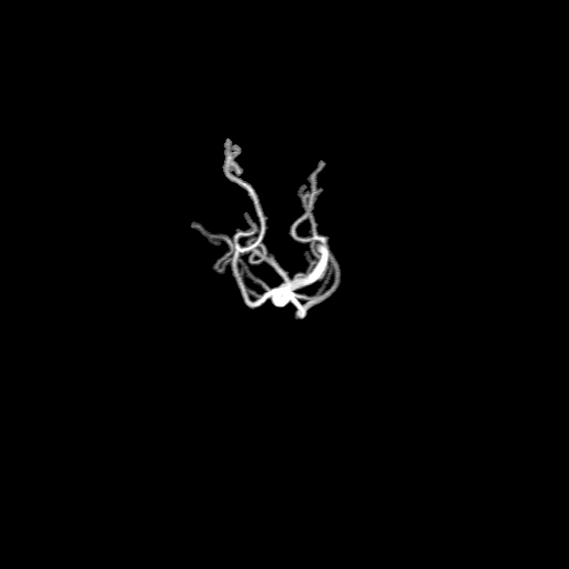

[17 of 48 positions shown; findings below may reference images not displayed]

FINDINGS: MRI HEAD FINDINGS

Brain: Acute infarcts within the left greater than right occipital
lobes, confluent in left. Additional punctate infarct in the right
temporal lobe and multiple punctate infarcts in bilateral frontal
white matter. Single punctate infarct in the right cerebellum. These
infarcts are associated with edema, greatest in the left occipital
lobe. No significant mass effect. No evidence of acute hemorrhage
mass lesion, midline shift, hydrocephalus, or extra-axial fluid
collection. Cerebral atrophy. A small infarct in the right occipital
lobe demonstrates mild enhancement. Also, linear enhancement in the
left cerebellum in a region of mild edema, probably subacute
infarct. Otherwise, no abnormal enhancement on motion limited
evaluation.

Vascular: Detailed below.

Skull and upper cervical spine: Normal marrow signal. Degenerative
pannus at the craniocervical junction.

Sinuses/Orbits: Mild paranasal sinus mucosal thickening with
inferior maxillary sinus retention cyst.

MRA HEAD FINDINGS

Anterior Circulation: Bilateral intracranial ICAs, MCAs, and ACAs
are patent without proximal hemodynamically significant stenosis. No
aneurysm identified.

Posterior circulation the visualized intradural vertebral arteries
basilar artery, and posterior cerebral arteries are patent without
proximal hemodynamically significant stenosis. No aneurysm
identified.

MRA NECK FINDINGS

Motion limited.

Aorta: Great vessel origins are incompletely imaged.

Carotid system: Bilateral common carotid internal carotid arteries
are patent. Atherosclerosis at the left carotid bifurcation without
greater than 50% stenosis relative to the distal vessel.

Vertebral arteries: Left dominant. No evidence of significant
(greater than 50%) stenosis
IMPRESSION: MRI:

1. Acute, confluent infarct in the left occipital lobe (left PCA
territory). Multiple additional small infarcts in the right
occipital lobe, bilateral frontal white matter, right temporal lobe,
and right cerebellum, as detailed above. Given involvement of
multiple vascular territories, consider embolic etiology.
2. Mild cortical right occipital and left cerebellar enhancement,
probably related to subacute infarcts given the above findings and
appearence.

MRA head:

No large vessel occlusion or proximal hemodynamically significant
stenosis.

MRA neck:

Motion limited without evidence of significant (greater than 50%)
stenosis

## 2021-03-17 IMAGING — MR MR MRA NECK WO/W CM
4 of 9 series · 17 of 48 positions shown · IV contrast (Yes GAD)
Comparison: None.

CLINICAL DATA: Transient ischemic attack (TIA). Stroke, follow up.
Neuro deficit, acute, stroke. suspected R hemianopsia.

EXAM:
MRI HEAD WITHOUT AND WITH CONTRAST
MRA HEAD WITHOUT CONTRAST
MRA NECK WITHOUT AND WITH CONTRAST
TECHNIQUE: Multiplanar, multiecho pulse sequences of the brain and surrounding
structures were obtained without and with intravenous contrast.
Angiographic images of the Circle of Willis were obtained using MRA
technique without intravenous contrast. Angiographic images of the
neck were obtained using MRA technique without and with intravenous
contrast. Carotid stenosis measurements (when applicable) are
obtained utilizing NASCET criteria, using the distal internal
carotid diameter as the denominator.
CONTRAST:  7mL GADAVIST GADOBUTROL 1 MMOL/ML IV SOLN

[Series 300: cor cemra ft · coronal · 1.2mm · 0.59mm/px · 6 of 109 slices shown]
[im 1/109]
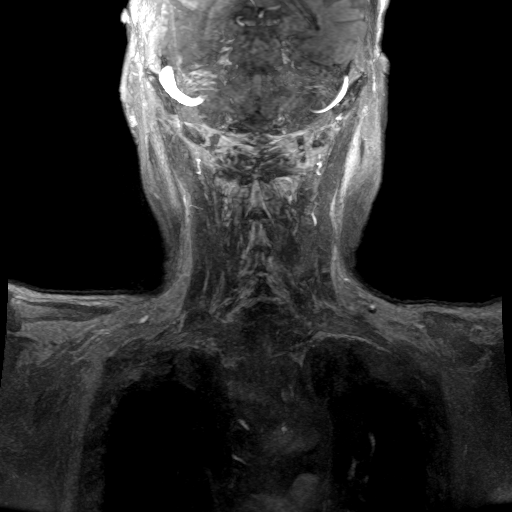
[im 22/109]
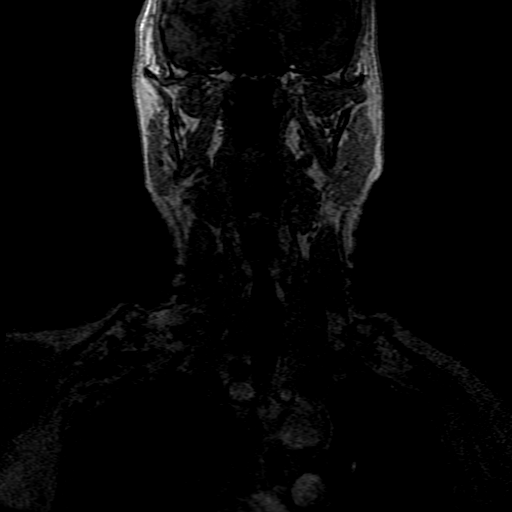
[im 44/109]
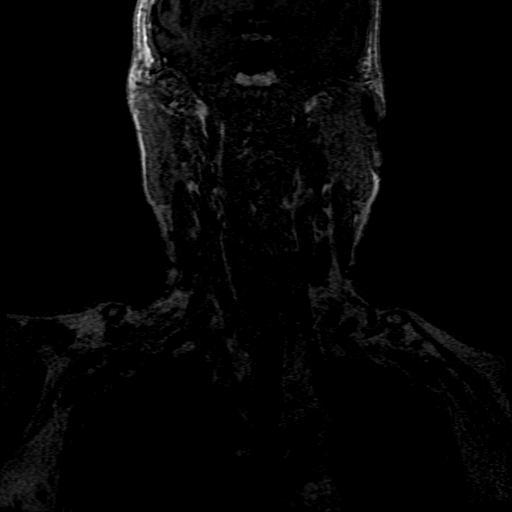
[im 65/109]
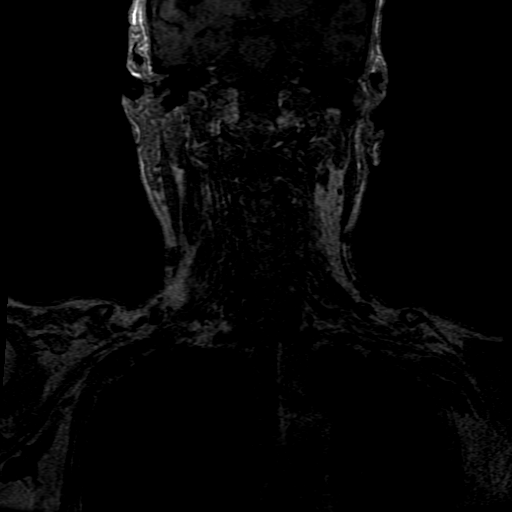
[im 87/109]
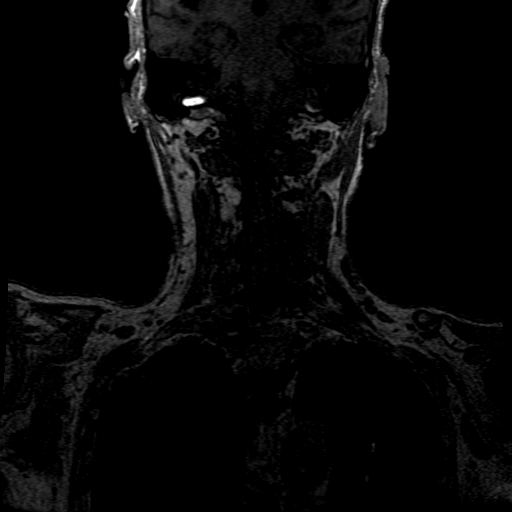
[im 109/109]
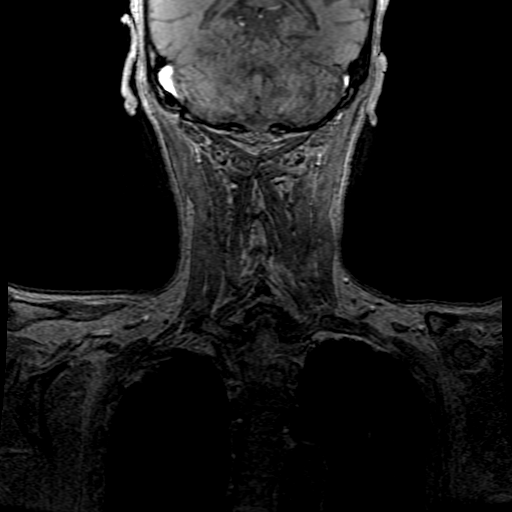

[Series 301: ph1/cor cemra ft · coronal · 1.2mm · 0.59mm/px · 5 of 109 slices shown]
[im 1/109]
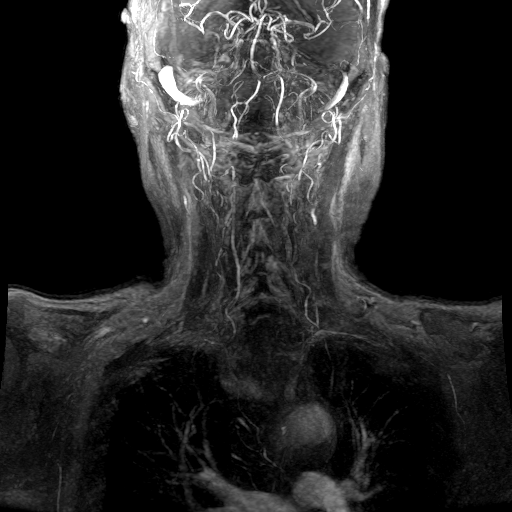
[im 22/109]
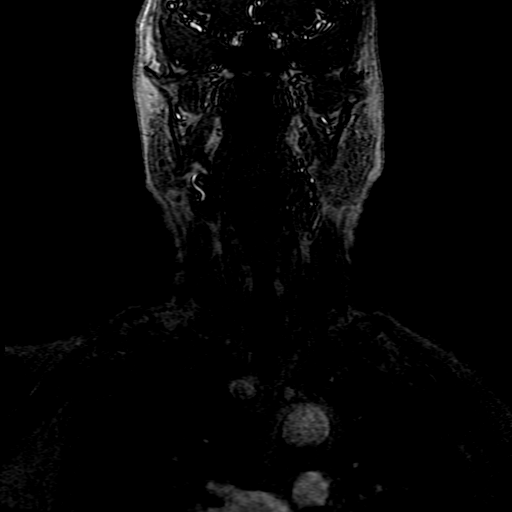
[im 44/109]
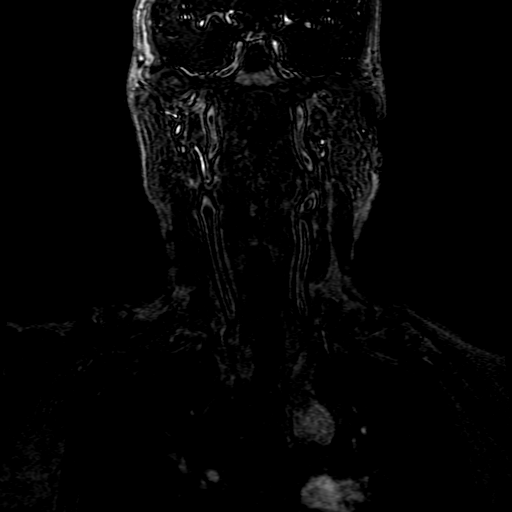
[im 65/109]
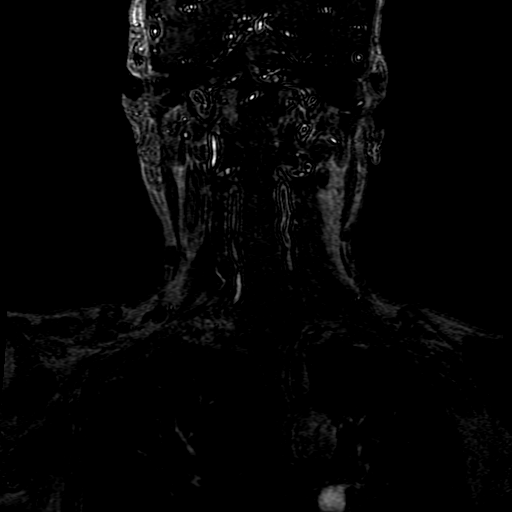
[im 109/109]
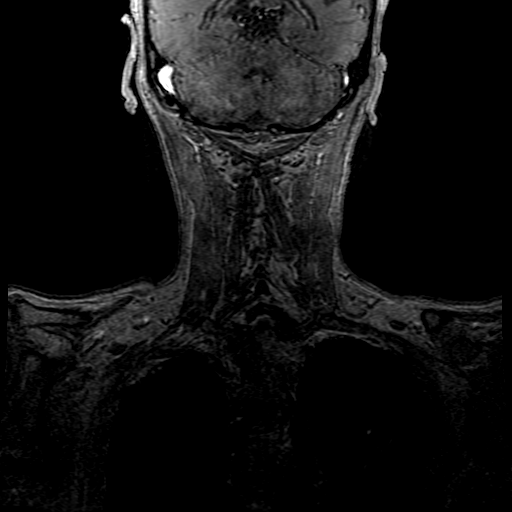

[Series 302: ph2/cor cemra ft · coronal · 1.2mm · 0.59mm/px · 3 of 109 slices shown]
[im 22/109]
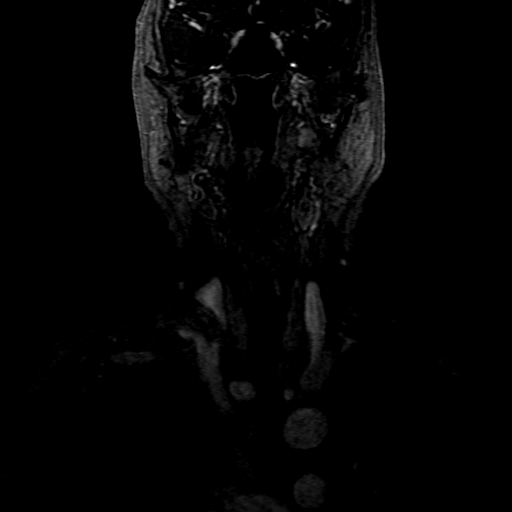
[im 65/109]
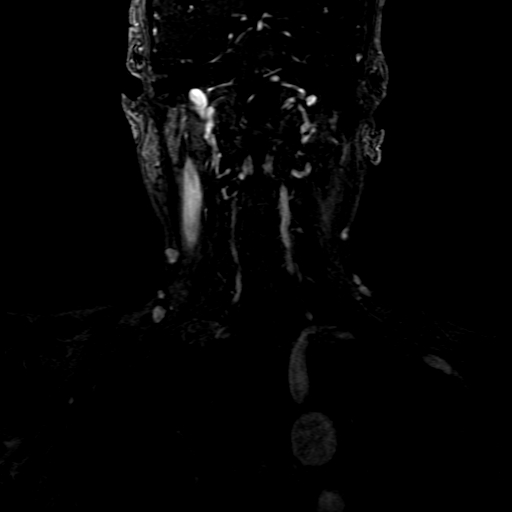
[im 109/109]
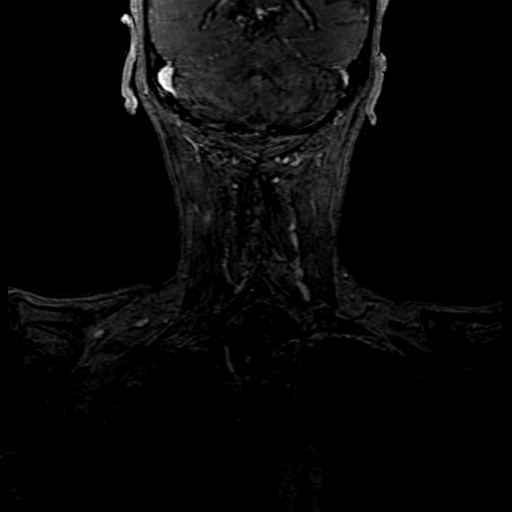

[((date))-((date)) · coronal · 1.2mm · 0.59mm/px · 3 of 109 slices shown]
[im 22/109]
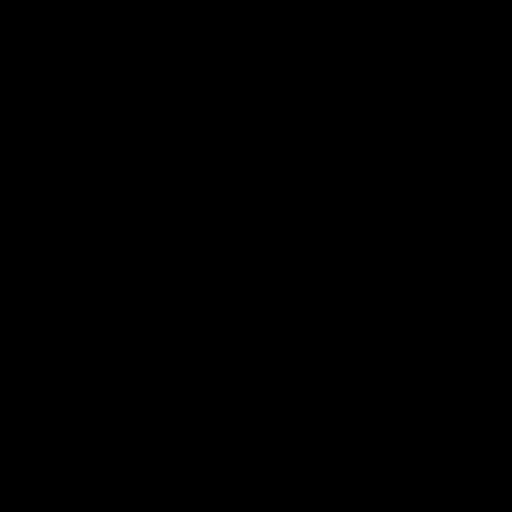
[im 65/109]
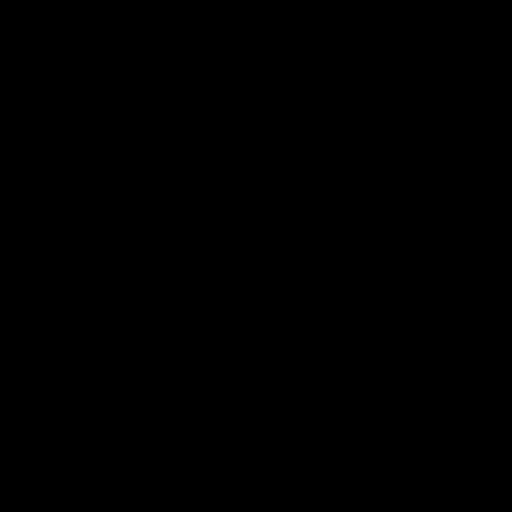
[im 109/109]
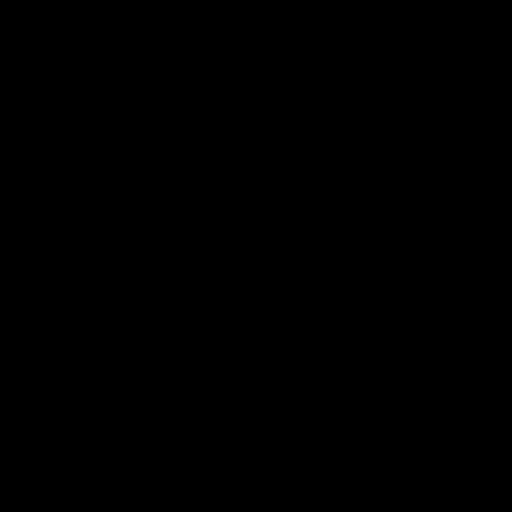

[17 of 48 positions shown; findings below may reference images not displayed]

FINDINGS: MRI HEAD FINDINGS

Brain: Acute infarcts within the left greater than right occipital
lobes, confluent in left. Additional punctate infarct in the right
temporal lobe and multiple punctate infarcts in bilateral frontal
white matter. Single punctate infarct in the right cerebellum. These
infarcts are associated with edema, greatest in the left occipital
lobe. No significant mass effect. No evidence of acute hemorrhage
mass lesion, midline shift, hydrocephalus, or extra-axial fluid
collection. Cerebral atrophy. A small infarct in the right occipital
lobe demonstrates mild enhancement. Also, linear enhancement in the
left cerebellum in a region of mild edema, probably subacute
infarct. Otherwise, no abnormal enhancement on motion limited
evaluation.

Vascular: Detailed below.

Skull and upper cervical spine: Normal marrow signal. Degenerative
pannus at the craniocervical junction.

Sinuses/Orbits: Mild paranasal sinus mucosal thickening with
inferior maxillary sinus retention cyst.

MRA HEAD FINDINGS

Anterior Circulation: Bilateral intracranial ICAs, MCAs, and ACAs
are patent without proximal hemodynamically significant stenosis. No
aneurysm identified.

Posterior circulation the visualized intradural vertebral arteries
basilar artery, and posterior cerebral arteries are patent without
proximal hemodynamically significant stenosis. No aneurysm
identified.

MRA NECK FINDINGS

Motion limited.

Aorta: Great vessel origins are incompletely imaged.

Carotid system: Bilateral common carotid internal carotid arteries
are patent. Atherosclerosis at the left carotid bifurcation without
greater than 50% stenosis relative to the distal vessel.

Vertebral arteries: Left dominant. No evidence of significant
(greater than 50%) stenosis
IMPRESSION: MRI:

1. Acute, confluent infarct in the left occipital lobe (left PCA
territory). Multiple additional small infarcts in the right
occipital lobe, bilateral frontal white matter, right temporal lobe,
and right cerebellum, as detailed above. Given involvement of
multiple vascular territories, consider embolic etiology.
2. Mild cortical right occipital and left cerebellar enhancement,
probably related to subacute infarcts given the above findings and
appearence.

MRA head:

No large vessel occlusion or proximal hemodynamically significant
stenosis.

MRA neck:

Motion limited without evidence of significant (greater than 50%)
stenosis

## 2021-03-17 IMAGING — MR MR HEAD WO/W CM
6 of 12 series · 26 of 48 positions shown · IV contrast (gadavist)
Comparison: None.

CLINICAL DATA: Transient ischemic attack (TIA). Stroke, follow up.
Neuro deficit, acute, stroke. suspected R hemianopsia.

EXAM:
MRI HEAD WITHOUT AND WITH CONTRAST
MRA HEAD WITHOUT CONTRAST
MRA NECK WITHOUT AND WITH CONTRAST
TECHNIQUE: Multiplanar, multiecho pulse sequences of the brain and surrounding
structures were obtained without and with intravenous contrast.
Angiographic images of the Circle of Willis were obtained using MRA
technique without intravenous contrast. Angiographic images of the
neck were obtained using MRA technique without and with intravenous
contrast. Carotid stenosis measurements (when applicable) are
obtained utilizing NASCET criteria, using the distal internal
carotid diameter as the denominator.
CONTRAST:  7mL GADAVIST GADOBUTROL 1 MMOL/ML IV SOLN

[Series 2: DWI · axial · 3.0mm · 0.94mm/px · z∈[-123,+35]mm · 8 of 107 slices shown (1 of 2)]
[im 1/107]
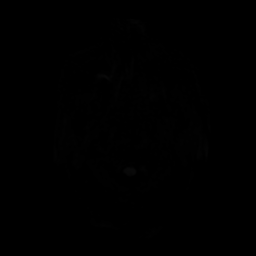
[im 16/107]
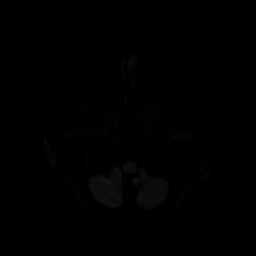
[im 31/107]
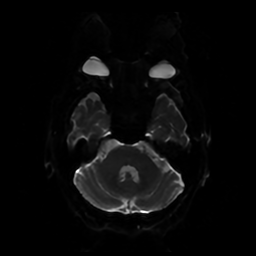
[im 46/107]
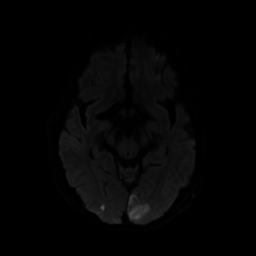
[im 61/107]
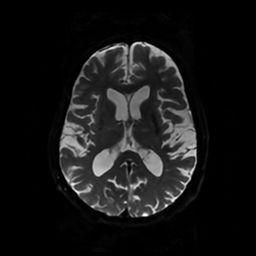
[im 76/107]
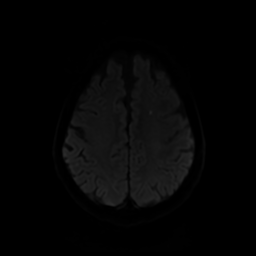
[im 91/107]
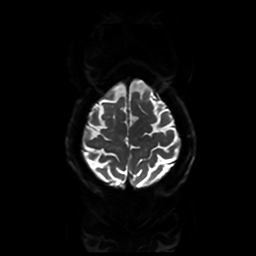
[im 107/107]
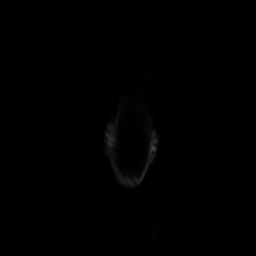

[Series 3: DWI · coronal · 4.0mm · 0.94mm/px · 6 of 76 slices shown (2 of 2)]
[im 1/76]
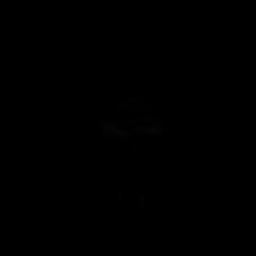
[im 16/76]
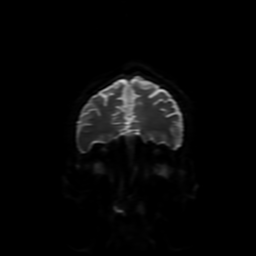
[im 31/76]
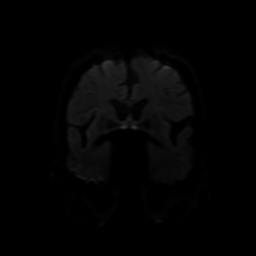
[im 46/76]
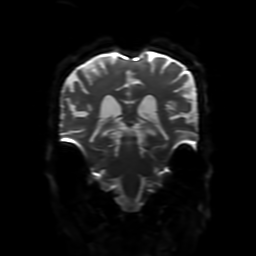
[im 61/76]
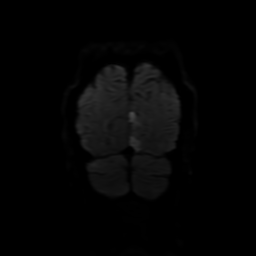
[im 76/76]
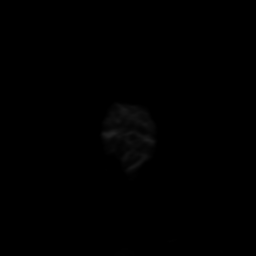

[Series 4: FLAIR · sagittal · 5.0mm · 0.23mm/px · 2 of 25 slices shown (1 of 2)]
[im 1/25]
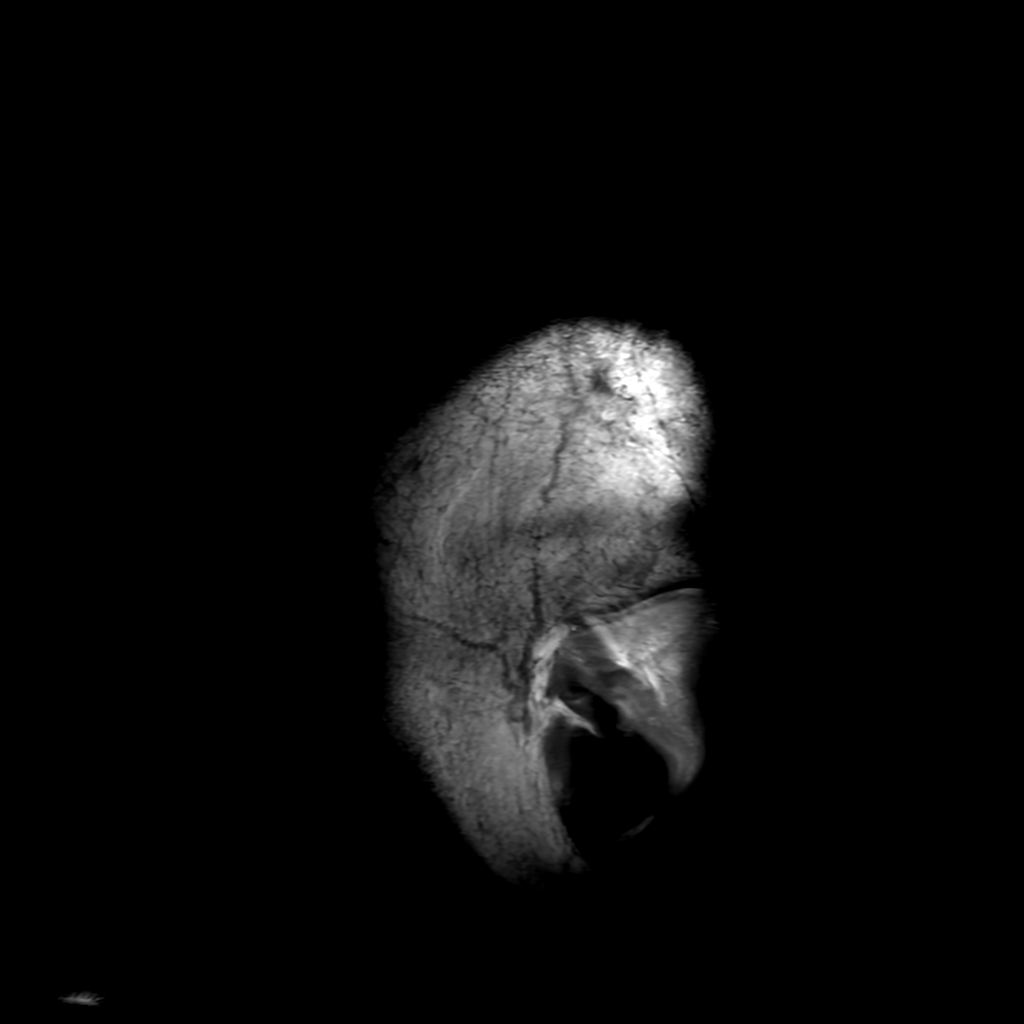
[im 25/25]
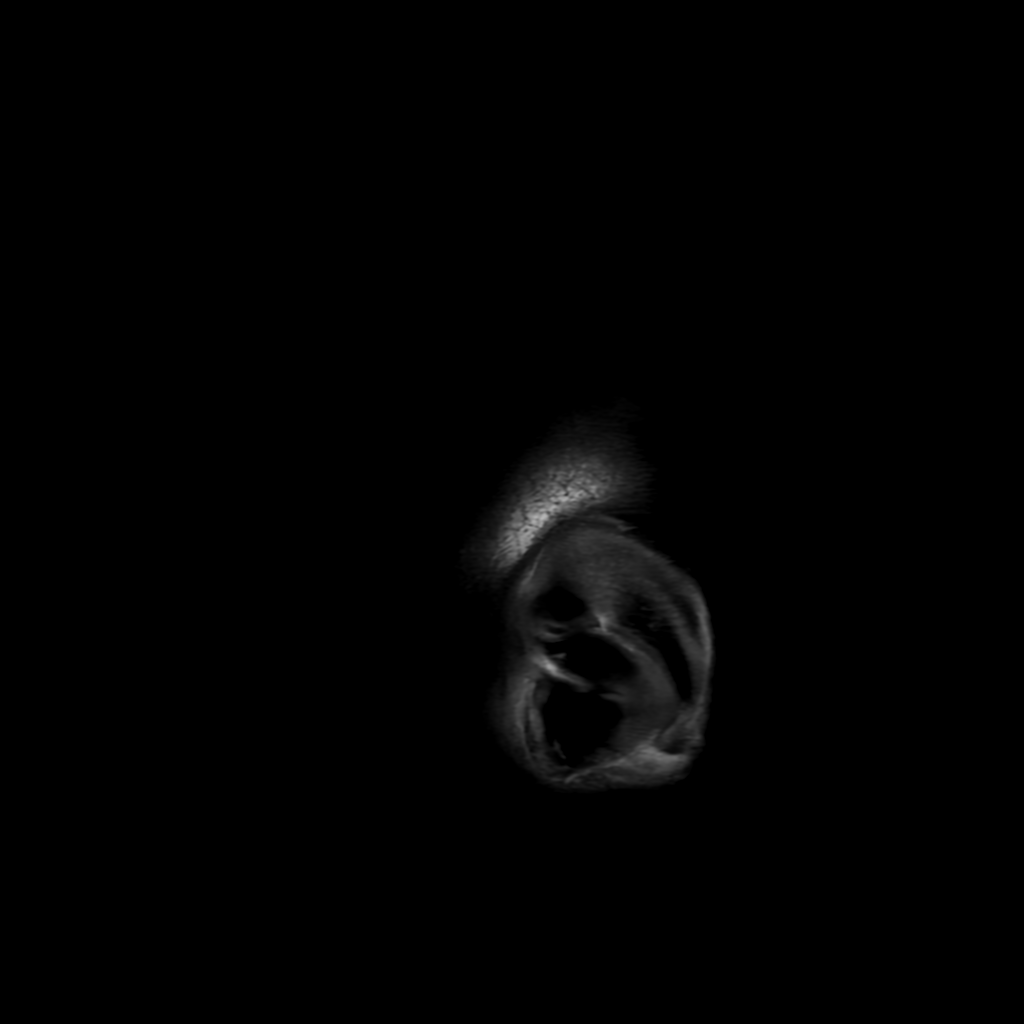

[Series 6: FLAIR · axial · 4.0mm · 0.45mm/px · z∈[-121,+37]mm · 3 of 37 slices shown (2 of 2)]
[im 1/37]
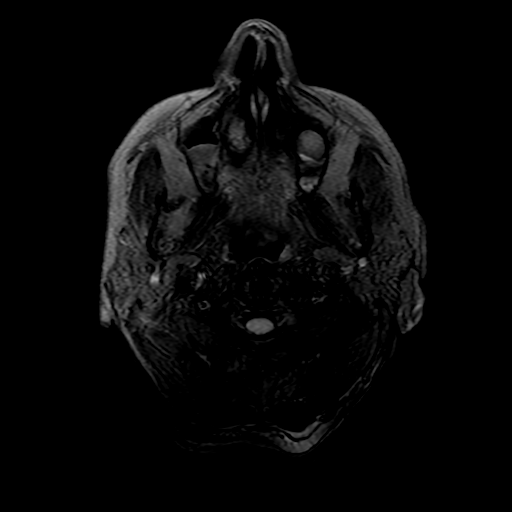
[im 19/37]
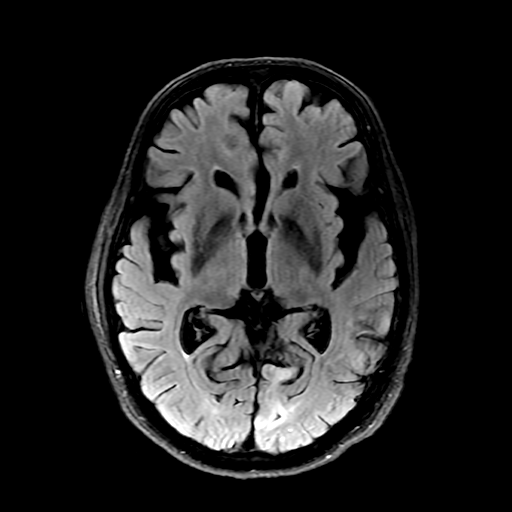
[im 37/37]
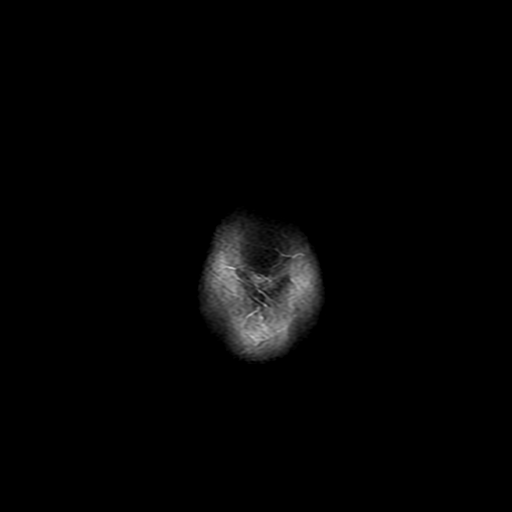

[Series 250: ADC · axial · 3.0mm · 0.94mm/px · z∈[-123,+35]mm · 4 of 54 slices shown (1 of 2)]
[im 1/54]
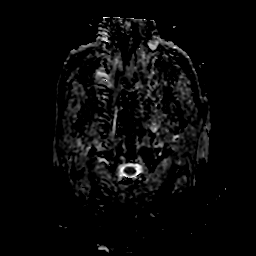
[im 18/54]
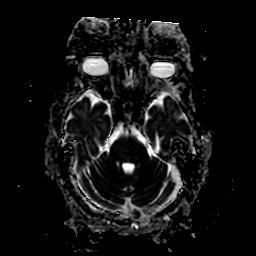
[im 36/54]
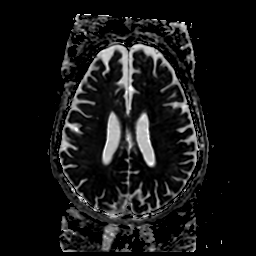
[im 54/54]
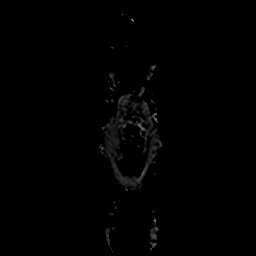

[Series 350: ADC · coronal · 4.0mm · 0.94mm/px · 3 of 37 slices shown (2 of 2)]
[im 1/37]
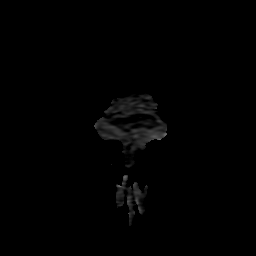
[im 19/37]
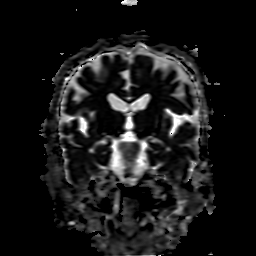
[im 37/37]
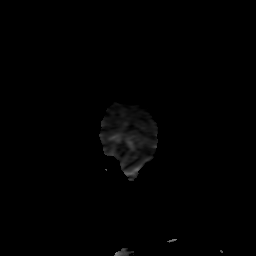

[26 of 48 positions shown; findings below may reference images not displayed]

FINDINGS: MRI HEAD FINDINGS

Brain: Acute infarcts within the left greater than right occipital
lobes, confluent in left. Additional punctate infarct in the right
temporal lobe and multiple punctate infarcts in bilateral frontal
white matter. Single punctate infarct in the right cerebellum. These
infarcts are associated with edema, greatest in the left occipital
lobe. No significant mass effect. No evidence of acute hemorrhage
mass lesion, midline shift, hydrocephalus, or extra-axial fluid
collection. Cerebral atrophy. A small infarct in the right occipital
lobe demonstrates mild enhancement. Also, linear enhancement in the
left cerebellum in a region of mild edema, probably subacute
infarct. Otherwise, no abnormal enhancement on motion limited
evaluation.

Vascular: Detailed below.

Skull and upper cervical spine: Normal marrow signal. Degenerative
pannus at the craniocervical junction.

Sinuses/Orbits: Mild paranasal sinus mucosal thickening with
inferior maxillary sinus retention cyst.

MRA HEAD FINDINGS

Anterior Circulation: Bilateral intracranial ICAs, MCAs, and ACAs
are patent without proximal hemodynamically significant stenosis. No
aneurysm identified.

Posterior circulation the visualized intradural vertebral arteries
basilar artery, and posterior cerebral arteries are patent without
proximal hemodynamically significant stenosis. No aneurysm
identified.

MRA NECK FINDINGS

Motion limited.

Aorta: Great vessel origins are incompletely imaged.

Carotid system: Bilateral common carotid internal carotid arteries
are patent. Atherosclerosis at the left carotid bifurcation without
greater than 50% stenosis relative to the distal vessel.

Vertebral arteries: Left dominant. No evidence of significant
(greater than 50%) stenosis
IMPRESSION: MRI:

1. Acute, confluent infarct in the left occipital lobe (left PCA
territory). Multiple additional small infarcts in the right
occipital lobe, bilateral frontal white matter, right temporal lobe,
and right cerebellum, as detailed above. Given involvement of
multiple vascular territories, consider embolic etiology.
2. Mild cortical right occipital and left cerebellar enhancement,
probably related to subacute infarcts given the above findings and
appearence.

MRA head:

No large vessel occlusion or proximal hemodynamically significant
stenosis.

MRA neck:

Motion limited without evidence of significant (greater than 50%)
stenosis

## 2021-03-17 MED ORDER — METFORMIN HCL 500 MG PO TABS
1000.0000 mg | ORAL_TABLET | Freq: Two times a day (BID) | ORAL | Status: DC
  Administered 2021-03-17 – 2021-03-22 (×10): 1000 mg via ORAL
  Filled 2021-03-17 (×10): qty 2

## 2021-03-17 MED ORDER — GADOBUTROL 1 MMOL/ML IV SOLN
7.0000 mL | Freq: Once | INTRAVENOUS | Status: AC | PRN
  Administered 2021-03-17: 7 mL via INTRAVENOUS

## 2021-03-17 NOTE — Progress Notes (Signed)
FPTS Interim Progress Note  S:was called to pts room by nurse due to complaint of vision loss in his right eye since he woke up this morning.  Went to see patient and determined that vision loss was of the peripheral right only.  Patient denies any headache, chest pain, shortness of breath, tinnitus or any other symptoms.  Patient states that it did happen when he woke up this morning and has never experienced anything like this before.  O: BP 127/75 (BP Location: Left Arm)    Pulse 70    Temp (!) 97.4 F (36.3 C) (Oral)    Resp 18    Ht 5\' 11"  (1.803 m)    Wt 69.5 kg    SpO2 98%    BMI 21.38 kg/m   General: Patient sitting up on commode, pleasant to speak with, NAD Cardio: RRR, normal S1/S2 Lungs: CTA B, normal work of breathing Neuro: Cranial nerves II through XII intact with exception of loss of peripheral vision in right eye only, sensation intact, finger-nose-finger normal with the exception of not being able to see my finger when it is in his right peripheral vision, alternating hand movements normal  A/P: Patient has significant cardiac history, was hospitalized last month for PE and had subsequent cardiac arrest and is status post PCI with DES.  Patient is currently here for reversible anticoagulation therapy to have lung biopsy for suspicious pulmonary nodules.   Right-sided peripheral vision loss occurred suddenly this morning and is evident on exam.  Patient cannot have tPA due to receiving Eliquis this morning. -Head CT ordered -If CT is negative we will plan for MRI -Neurology consulted   Precious Gilding, DO 03/17/2021, 10:13 AM PGY-1, [place] [facility] Service pager [phone]

## 2021-03-17 NOTE — Code Documentation (Addendum)
Stroke Response Nurse Documentation Code Documentation  Shown Arthur [name] is a 81 y.o. male admitted to [facility] 03/15/21 with past medical hx of diabetes, HLD, HTN. On Eliquis (apixaban) daily. Code stroke was activated by 6N RN  Patient was currently admitted to the hosptial when code stroke activated. He woke up and reported that he had new onset peripheral vision loss to his right eye. His LKW is 0830. No other stroke signs and symptoms.   Patient to CT with team. . Patient with NIHSS = 1 for visual field deficit on exam. The following imaging was completed:  CT (CT, CTA head and neck, CTP). Patient is not a candidate for IV Thrombolytic due to him currently receiving  Eliquis.   Care/Plan:  q30 NIHSS/VS until outside window and the q2 hours x 12  Bedside handoff with ED RN Motley  Rapid Response RN

## 2021-03-17 NOTE — Progress Notes (Addendum)
Family Medicine Teaching Service Daily Progress Note Intern Pager: [phone]  Patient name: Arthur [name] Medical record number: [id number] Date of birth: [birth year] Age: 81 y.o. Gender: male  Primary Care Provider: Lind Covert, MD Consultants: PCCM Code Status: Full  Pt Overview and Major Events to Date:  2/16-admitted 2/17-clopidogrel held and IV cangrelor started  Assessment and Plan: Arthur [name] is a 81 y.o. male admitted for reversible anticoagulation therapy to have lung biopsy for suspicious pulmonary nodules.  PMH significant for cardiac arrest, CAD s/p PCI, PE on apixaban, gout, HTN, T2DM.  Pulmonary nodules Currently undergoing clopidogrel and apixaban washout. - Pulmonology following, plan for biopsy on 2/22 - Pharmacy consulted - Continue IV cangrelor - Continue apixaban, transition to IV heparin 2 days prior to procedure  Hx PE   CAD s/p PCI with DES - Anticoagulation/antiplatelet therapy as above - Continue Crestor 20 mg daily - Continue atenolol 50 mg twice daily  HTN BP largely normotensive, most recently 114/82 - Continue atenolol 50 mg twice daily - Continue HCTZ 12.5 mg daily - Continue losartan 100 mg daily  T2DM CBGs <180, patient received 5 units NovoLog in the last 24 hours. - Restart home metformin - Stop  SSI - Stop CBGs  Gout - Tapering colchicine to 0.6 mg daily (from twice daily) - Continue to hold allopurinol  FEN/GI: Regular diet PPx: Apixaban Dispo: Pending lung biopsy  Subjective:   Patient has no complaints at this time. Denies leg swelling, chest pain, shortness of breath.  Objective: Temp:  [98 F (36.7 C)-98.2 F (36.8 C)] 98.2 F (36.8 C) (02/18 0533) Pulse Rate:  [70-74] 74 (02/18 0533) Resp:  [17-19] 19 (02/18 0533) BP: (114-139)/(56-82) 114/82 (02/18 0533) SpO2:  [98 %-100 %] 98 % (02/18 0533) Physical Exam: General: elderly male, NAD, sleeping initially but easily awoken Cardiovascular: RRR, no  murmur appreciated Respiratory: CTAB, no increased WOB, speaking in full sentences Abdomen: soft, non-tender, non-distended Extremities: no wounds on lower extremities, no peripheral edema, FROM  Laboratory: Recent Labs  Lab 03/16/21 0435  WBC 12.6*  HGB 11.1*  HCT 35.3*  PLT 281   Recent Labs  Lab 03/16/21 0435  NA 135  K 4.4  CL 103  CO2 23  BUN 21  CREATININE 1.24  CALCIUM 9.1  PROT 6.6  BILITOT 0.6  ALKPHOS 109  ALT 14  AST 18  GLUCOSE 114*    Imaging/Diagnostic Tests: No results found.   Arthur Patience, DO 03/17/2021, 5:48 AM PGY-2, [place] Intern pager: [phone], text pages welcome

## 2021-03-17 NOTE — Consult Note (Addendum)
NEUROLOGY CONSULTATION NOTE   Date of service: March 17, 2021 Patient Name: Arthur [name] MRN:  [id number] DOB:  1940/08/01 Reason for consult: code stroke Requesting physician: Dr. Talbert Cage _ _ _   _ __   _ __ _ _  __ __   _ __   __ _  History of Present Illness   This is a 81 yo gentleman with hx DM2, HL admitted for eliquis washout prior to biopsy of suspicious pulmonary nodules. Stroke code was called this AM after he reported new onset vision loss in his peripheral vision of his R eye only. LKW 0830. No other deficits on exam, NIHSS = 1. CTH NAICP (personal review). Patient was not a TNK candidate 2/2 mild and rapidly-resolving sx and last dose of eliquis this AM. Exam not c/w LVO therefore vascular imaging not performed as part of the stroke code.   ROS   Per HPI: all other systems reviewed and are negative  Past History   I have reviewed the following:  Past Medical History:  Diagnosis Date   Diabetes mellitus without complication ([place])    High cholesterol    Hypertension    Normal nuclear stress test 2010   stress perfusion study apparently in 2010 in [facility] which he said was negative.   Past Surgical History:  Procedure Laterality Date   CATARACT EXTRACTION, BILATERAL  2006   CORONARY STENT INTERVENTION N/A 02/06/2021   Procedure: CORONARY STENT INTERVENTION;  Surgeon: Jettie Booze, MD;  Location: [facility];  Service: Cardiovascular;  Laterality: N/A;   INTRAVASCULAR ULTRASOUND/IVUS N/A 02/06/2021   Procedure: Intravascular Ultrasound/IVUS;  Surgeon: Jettie Booze, MD;  Location: [facility];  Service: Cardiovascular;  Laterality: N/A;   KNEE SURGERY  2005   LEFT HEART CATH AND CORONARY ANGIOGRAPHY N/A 02/06/2021   Procedure: LEFT HEART CATH AND CORONARY ANGIOGRAPHY;  Surgeon: Jettie Booze, MD;  Location: [facility];  Service: Cardiovascular;  Laterality: N/A;   Family History  Problem Relation Age  of Onset   Coronary artery disease Father    Coronary artery disease Mother    Social History   Socioeconomic History   Marital status: Married    Spouse name: Not on file   Number of children: Not on file   Years of education: Not on file   Highest education level: Not on file  Occupational History   Not on file  Tobacco Use   Smoking status: Never   Smokeless tobacco: Never  Substance and Sexual Activity   Alcohol use: No   Drug use: No   Sexual activity: Not on file  Other Topics Concern   Not on file  Social History Narrative   Social History:   He works part-time as a Doctor, general practice. He is married with 3 children. He's never smoked cigarettes and does not drink alcohol.            Social Determinants of Health   Financial Resource Strain: Not on file  Food Insecurity: Not on file  Transportation Needs: Not on file  Physical Activity: Not on file  Stress: Not on file  Social Connections: Not on file   Allergies  Allergen Reactions   Ace Inhibitors     REACTION: Cough    Medications   Medications Prior to Admission  Medication Sig Dispense Refill Last Dose   acetaminophen (TYLENOL) 650 MG CR tablet Take 650 mg by mouth every 8 (eight) hours as needed  for pain.   unknown   albuterol (PROAIR HFA) 108 (90 Base) MCG/ACT inhaler Inhale 2 puffs into the lungs every 4 (four) hours as needed for wheezing or shortness of breath. 18 g 0 unknown   allopurinol (ZYLOPRIM) 100 MG tablet Take 1 tablet (100 mg total) by mouth daily. 30 tablet 6 03/15/2021   apixaban (ELIQUIS) 5 MG TABS tablet Take 1 tablet (5 mg total) by mouth 2 (two) times daily. 60 tablet 11 03/15/2021 at 0830   atenolol (TENORMIN) 50 MG tablet Take 1 tablet (50 mg total) by mouth 2 (two) times daily. 180 tablet 3 03/15/2021 at 0830   benzonatate (TESSALON) 100 MG capsule TAKE 1 CAPSULE BY MOUTH TWICE DAILY AS NEEDED FOR COUGH (Patient taking differently: Take 100 mg by mouth 3 (three) times daily as needed for  cough.) 15 capsule 0 unknown   clopidogrel (PLAVIX) 75 MG tablet Take 1 tablet (75 mg total) by mouth daily with breakfast. 30 tablet 11 03/15/2021 at 0830   colchicine 0.6 MG tablet Take 1 tablet (0.6 mg total) by mouth daily. (Patient taking differently: Take 0.6 mg by mouth every 12 (twelve) hours.) 30 tablet 1 03/15/2021   diclofenac Sodium (VOLTAREN) 1 % GEL Apply 2 g topically 4 (four) times daily. (Patient taking differently: Apply 2-4 g topically 4 (four) times daily as needed (knees/legs).) 50 g 1 unknown   ferrous sulfate 324 (65 Fe) MG TBEC Take 324 mg by mouth daily. 30 tablet  03/15/2021   metFORMIN (GLUCOPHAGE) 1000 MG tablet Take 1 tablet (1,000 mg total) by mouth 2 (two) times daily with a meal. 180 tablet 3 03/15/2021   Multiple Vitamins-Minerals (CENTRUM SILVER 50+MEN) TABS Take 1 tablet by mouth daily.   03/15/2021   rosuvastatin (CRESTOR) 20 MG tablet Take 1 tablet (20 mg total) by mouth daily. 90 tablet 2 05/06/8117   trolamine salicylate (ASPER-FLEX) 10 % cream Apply 1 application topically as needed for muscle pain. 85 g 0 unknown   hydrochlorothiazide (HYDRODIURIL) 12.5 MG tablet Take 1 tablet (12.5 mg total) by mouth daily. (Patient not taking: Reported on 03/16/2021) 90 tablet 3 Not Taking   losartan (COZAAR) 100 MG tablet Take 1 tablet (100 mg total) by mouth at bedtime. (Patient not taking: Reported on 03/16/2021) 90 tablet 3 Not Taking      Current Facility-Administered Medications:    acetaminophen (TYLENOL) tablet 650 mg, 650 mg, Oral, Q6H PRN **OR** acetaminophen (TYLENOL) suppository 650 mg, 650 mg, Rectal, Q6H PRN, Zola Button, MD   albuterol (PROVENTIL) (2.5 MG/3ML) 0.083% nebulizer solution 3 mL, 3 mL, Inhalation, Q4H PRN, Zola Button, MD   apixaban Arne [place]) tablet 5 mg, 5 mg, Oral, BID, Zola Button, MD, 5 mg at 03/17/21 0858   atenolol (TENORMIN) tablet 50 mg, 50 mg, Oral, BID, Zola Button, MD, 50 mg at 03/17/21 0858   cangrelor [facility]) 50 mg in sodium chloride  0.9 % 250 mL (0.2 mg/mL) infusion, 0.75 mcg/kg/min, Intravenous, Continuous, Henri Medal, RPH, Last Rate: 15.64 mL/hr at 03/17/21 0224, 0.75 mcg/kg/min at 03/17/21 0224   colchicine tablet 0.6 mg, 0.6 mg, Oral, BID, Zola Button, MD, 0.6 mg at 03/17/21 1478   hydrochlorothiazide (HYDRODIURIL) tablet 12.5 mg, 12.5 mg, Oral, Daily, Zola Button, MD, 12.5 mg at 03/17/21 0858   losartan (COZAAR) tablet 100 mg, 100 mg, Oral, QHS, Zola Button, MD, 100 mg at 03/16/21 2109   metFORMIN (GLUCOPHAGE) tablet 1,000 mg, 1,000 mg, Oral, BID WC, Lilland, Alana, DO, 1,000 mg at 03/17/21 2956  multivitamin with minerals tablet 1 tablet, 1 tablet, Oral, Daily, Zola Button, MD, 1 tablet at 03/17/21 0858   polyethylene glycol (MIRALAX / GLYCOLAX) packet 17 g, 17 g, Oral, Daily PRN, Zola Button, MD   rosuvastatin (CRESTOR) tablet 20 mg, 20 mg, Oral, Daily, Zola Button, MD, 20 mg at 03/17/21 0858  Vitals   Vitals:   03/16/21 1706 03/16/21 2058 03/17/21 0533 03/17/21 0847  BP: 138/60 134/72 114/82 127/75  Pulse: 72 71 74 70  Resp: 18 17 19 18   Temp: 98 F (36.7 C) 98.1 F (36.7 C) 98.2 F (36.8 C) (!) 97.4 F (36.3 C)  TempSrc: Oral Oral Oral Oral  SpO2: 99% 100% 98% 98%  Weight:      Height:         Body mass index is 21.38 kg/m.  Physical Exam   Physical Exam Gen: A&O x4, NAD HEENT: Atraumatic, normocephalic;mucous membranes moist; oropharynx clear, tongue without atrophy or fasciculations. Neck: Supple, trachea midline. Resp: CTAB, no w/r/r CV: RRR, no m/g/r; nml S1 and S2. 2+ symmetric peripheral pulses. Abd: soft/NT/ND; nabs x 4 quad Extrem: Nml bulk; no cyanosis, clubbing, or edema.  Neuro: *MS: A&O x4. Follows multi-step commands.  *Speech: fluid, nondysarthric, able to name and repeat *CN:    I: Deferred   II,III: PERRLA, R lateral hemifield deficit R eye only, optic discs unable to be visualized 2/2 pupillary constriction   III,IV,VI: EOMI w/o nystagmus, no ptosis   V:  Sensation intact from V1 to V3 to LT   VII: Eyelid closure was full.  Smile symmetric.   VIII: Hearing intact to voice   IX,X: Voice normal, palate elevates symmetrically    XI: SCM/trap 5/5 bilat   XII: Tongue protrudes midline, no atrophy or fasciculations   *Motor:   Normal bulk.  No tremor, rigidity or bradykinesia. No pronator drift.    Strength: Dlt Bic Tri WrE WrF FgS Gr HF KnF KnE PlF DoF    Left 5 5 5 5 5 5 5 5 5 5 5 5     Right 5 5 5 5 5 5 5 5 5 5 5 5     *Sensory: Intact to light touch, pinprick, temperature vibration throughout. Symmetric. Propioception intact bilat.  No double-simultaneous extinction.  *Coordination:  Finger-to-nose, heel-to-shin, rapid alternating motions were intact. *Reflexes:  2+ and symmetric throughout without clonus; toes down-going bilat *Gait: normal base, normal stride, normal turn.   NIHSS = 1 for visual field deficit   Premorbid mRS = 0   Labs   CBC:  Recent Labs  Lab 03/16/21 0435  WBC 12.6*  HGB 11.1*  HCT 35.3*  MCV 82.5  PLT 559    Basic Metabolic Panel:  Lab Results  Component Value Date   NA 135 03/16/2021   K 4.4 03/16/2021   CO2 23 03/16/2021   GLUCOSE 114 (H) 03/16/2021   BUN 21 03/16/2021   CREATININE 1.24 03/16/2021   CALCIUM 9.1 03/16/2021   GFRNONAA 59 (L) 03/16/2021   GFRAA 58 (L) 09/08/2019   Lipid Panel:  Lab Results  Component Value Date   LDLCALC 71 02/06/2021   HgbA1c:  Lab Results  Component Value Date   HGBA1C 6.3 01/16/2021   Urine Drug Screen: No results found for: LABOPIA, COCAINSCRNUR, LABBENZ, AMPHETMU, THCU, LABBARB  Alcohol Level No results found for: ETH   Impression   This is a 81 yo gentleman with hx DM2, HL admitted for eliquis washout prior to biopsy of suspicious pulmonary nodules. Stroke  code was called this AM after he reported new onset vision loss in his peripheral vision of his R eye only. It is unclear if potentially he had R homonymous hemianopsia earlier this AM, but  his sx are now resolving. Incident c/f small stroke vs TIA, workup below.   Recommendations   - Permissive HTN x48 hrs from sx onset or until stroke ruled out by MRI goal BP <220/110. PRN labetalol or hydralazine if BP above these parameters. Avoid oral antihypertensives. - MRI brain wwo contrast (wwo 2/2 hx suspicious pulm nodules) - MRA H&N - Continue cangrelor and eliquis for cardiac DES and recent PE, respectively - TTE w/ bubble - Check A1c and LDL + add statin per guidelines - q4 hr neuro checks - STAT head CT for any change in neuro exam - Tele - PT/OT/SLP - NPO until passes bedside swallow - Stroke education - Amb referral to neurology upon discharge   Stroke team will continue to follow.   ______________________________________________________________________   Thank you for the opportunity to take part in the care of this patient. If you have any further questions, please contact the neurology consultation attending.  Signed,  Su Monks, MD [facility] [phone]  If 7pm- 7am, please page neurology on call as listed in [place].  Addendum 1802: MRI/MRA still pending. Plan to continue eliquis and cangrelor. Patient has DES placed last mo and requires antiplatelet and also had cardiac arrest in setting of multiple PE last month and anticoagulation cannot be safely held. If he does have acute infarct it is small based on exam, and while there may be a small risk of hemorrhagic conversion in the setting of anticoagulation the risk of holding anticoagulation outweighs the benefit in this case.  Su Monks, MD [facility] [phone]  If 7pm- 7am, please page neurology on call as listed in [place].

## 2021-03-18 ENCOUNTER — Inpatient Hospital Stay (HOSPITAL_COMMUNITY): Payer: Medicare Other

## 2021-03-18 DIAGNOSIS — I6340 Cerebral infarction due to embolism of unspecified cerebral artery: Secondary | ICD-10-CM | POA: Insufficient documentation

## 2021-03-18 DIAGNOSIS — I6389 Other cerebral infarction: Secondary | ICD-10-CM

## 2021-03-18 DIAGNOSIS — I2699 Other pulmonary embolism without acute cor pulmonale: Secondary | ICD-10-CM

## 2021-03-18 DIAGNOSIS — R918 Other nonspecific abnormal finding of lung field: Secondary | ICD-10-CM | POA: Diagnosis not present

## 2021-03-18 DIAGNOSIS — I639 Cerebral infarction, unspecified: Secondary | ICD-10-CM

## 2021-03-18 LAB — ECHOCARDIOGRAM COMPLETE BUBBLE STUDY
AR max vel: 2.34 cm2
AV Area VTI: 2.6 cm2
AV Area mean vel: 2.48 cm2
AV Mean grad: 5 mmHg
AV Peak grad: 9.6 mmHg
Ao pk vel: 1.55 m/s
Area-P 1/2: 2.94 cm2
MV VTI: 2.69 cm2
S' Lateral: 2.1 cm

## 2021-03-18 LAB — BASIC METABOLIC PANEL
Anion gap: 12 (ref 5–15)
BUN: 19 mg/dL (ref 8–23)
CO2: 18 mmol/L — ABNORMAL LOW (ref 22–32)
Calcium: 8.7 mg/dL — ABNORMAL LOW (ref 8.9–10.3)
Chloride: 102 mmol/L (ref 98–111)
Creatinine, Ser: 1.37 mg/dL — ABNORMAL HIGH (ref 0.61–1.24)
GFR, Estimated: 52 mL/min — ABNORMAL LOW (ref >60)
Glucose, Bld: 140 mg/dL — ABNORMAL HIGH (ref 70–99)
Potassium: 4 mmol/L (ref 3.5–5.1)
Sodium: 132 mmol/L — ABNORMAL LOW (ref 135–145)

## 2021-03-18 LAB — CBC
HCT: 37.3 % — ABNORMAL LOW (ref 39.0–52.0)
Hemoglobin: 12.2 g/dL — ABNORMAL LOW (ref 13.0–17.0)
MCH: 26.5 pg (ref 26.0–34.0)
MCHC: 32.7 g/dL (ref 30.0–36.0)
MCV: 81.1 fL (ref 80.0–100.0)
Platelets: 294 10*3/uL (ref 150–400)
RBC: 4.6 MIL/uL (ref 4.22–5.81)
RDW: 13.6 % (ref 11.5–15.5)
WBC: 14.1 10*3/uL — ABNORMAL HIGH (ref 4.0–10.5)
nRBC: 0 % (ref 0.0–0.2)

## 2021-03-18 LAB — GLUCOSE, CAPILLARY
Glucose-Capillary: 147 mg/dL — ABNORMAL HIGH (ref 70–99)
Glucose-Capillary: 175 mg/dL — ABNORMAL HIGH (ref 70–99)
Glucose-Capillary: 152 mg/dL — ABNORMAL HIGH (ref 70–99)
Glucose-Capillary: 111 mg/dL — ABNORMAL HIGH (ref 70–99)

## 2021-03-18 MED ORDER — HEPARIN (PORCINE) 25000 UT/250ML-% IV SOLN
1150.0000 [IU]/h | INTRAVENOUS | Status: DC
  Filled 2021-03-18: qty 250

## 2021-03-18 MED ORDER — ALLOPURINOL 100 MG PO TABS
100.0000 mg | ORAL_TABLET | Freq: Every day | ORAL | Status: DC
  Administered 2021-03-18 – 2021-03-22 (×5): 100 mg via ORAL
  Filled 2021-03-18 (×5): qty 1

## 2021-03-18 MED ORDER — ROSUVASTATIN CALCIUM 20 MG PO TABS
40.0000 mg | ORAL_TABLET | Freq: Every day | ORAL | Status: DC
Start: 2021-03-19 — End: 2021-03-22
  Administered 2021-03-19 – 2021-03-22 (×4): 40 mg via ORAL
  Filled 2021-03-18 (×4): qty 2

## 2021-03-18 MED ORDER — LOSARTAN POTASSIUM 25 MG PO TABS
25.0000 mg | ORAL_TABLET | Freq: Every day | ORAL | Status: DC
Start: 2021-03-18 — End: 2021-03-22
  Administered 2021-03-18 – 2021-03-22 (×5): 25 mg via ORAL
  Filled 2021-03-18 (×5): qty 1

## 2021-03-18 MED ORDER — LOSARTAN POTASSIUM 50 MG PO TABS: 50.0000 mg | ORAL_TABLET | Freq: Every day | ORAL | Status: DC

## 2021-03-18 MED ORDER — LOSARTAN POTASSIUM 50 MG PO TABS
50.0000 mg | ORAL_TABLET | Freq: Every day | ORAL | Status: DC
Start: 2021-03-18 — End: 2021-03-18

## 2021-03-18 NOTE — Progress Notes (Addendum)
STROKE TEAM PROGRESS NOTE   SUBJECTIVE (INTERVAL HISTORY) No family is at the bedside.  Overall his condition is stable. He is sitting in bed, stating his vision getting better. However, on exam, he still has right hemianopia with more severe on the upper quadrant.    OBJECTIVE Temp:  [97.8 F (36.6 C)-98.2 F (36.8 C)] 98.2 F (36.8 C) (02/19 0751) Pulse Rate:  [72-107] 107 (02/19 0751) Cardiac Rhythm: Junctional rhythm;Other (Comment) (02/19 0700) Resp:  [16-18] 18 (02/19 0751) BP: (105-156)/(60-67) 111/64 (02/19 0751) SpO2:  [97 %-100 %] 97 % (02/19 0751)  Recent Labs  Lab 03/16/21 1712 03/16/21 2055 03/17/21 1126 03/17/21 2103 03/18/21 0754  GLUCAP 174* 154* 162* 149* 147*   Recent Labs  Lab 03/16/21 0435 03/18/21 0506  NA 135 132*  K 4.4 4.0  CL 103 102  CO2 23 18*  GLUCOSE 114* 140*  BUN 21 19  CREATININE 1.24 1.37*  CALCIUM 9.1 8.7*   Recent Labs  Lab 03/16/21 0435  AST 18  ALT 14  ALKPHOS 109  BILITOT 0.6  PROT 6.6  ALBUMIN 2.6*   Recent Labs  Lab 03/16/21 0435 03/18/21 0506  WBC 12.6* 14.1*  HGB 11.1* 12.2*  HCT 35.3* 37.3*  MCV 82.5 81.1  PLT 281 294   No results for input(s): CKTOTAL, CKMB, CKMBINDEX, TROPONINI in the last 168 hours. No results for input(s): LABPROT, INR in the last 72 hours. No results for input(s): COLORURINE, LABSPEC, [place], GLUCOSEU, HGBUR, BILIRUBINUR, KETONESUR, PROTEINUR, UROBILINOGEN, NITRITE, LEUKOCYTESUR in the last 72 hours.  Invalid input(s): APPERANCEUR     Component Value Date/Time   CHOL 133 02/06/2021 0202   CHOL 157 09/08/2019 1011   TRIG 97 02/06/2021 0202   HDL 43 02/06/2021 0202   HDL 41 09/08/2019 1011   CHOLHDL 3.1 02/06/2021 0202   VLDL 19 02/06/2021 0202   LDLCALC 71 02/06/2021 0202   LDLCALC 82 09/08/2019 1011   Lab Results  Component Value Date   HGBA1C 6.2 (H) 03/17/2021   No results found for: LABOPIA, COCAINSCRNUR, LABBENZ, AMPHETMU, THCU, LABBARB  No results for input(s): ETH  in the last 168 hours.  I have personally reviewed the radiological images below and agree with the radiology interpretations.  MR ANGIO HEAD WO CONTRAST  Result Date: 03/17/2021 CLINICAL DATA:  Transient ischemic attack (TIA). Stroke, follow up. Neuro deficit, acute, stroke. suspected R hemianopsia. EXAM: MRI HEAD WITHOUT AND WITH CONTRAST MRA HEAD WITHOUT CONTRAST MRA NECK WITHOUT AND WITH CONTRAST TECHNIQUE: Multiplanar, multiecho pulse sequences of the brain and surrounding structures were obtained without and with intravenous contrast. Angiographic images of the Circle of Willis were obtained using MRA technique without intravenous contrast. Angiographic images of the neck were obtained using MRA technique without and with intravenous contrast. Carotid stenosis measurements (when applicable) are obtained utilizing NASCET criteria, using the distal internal carotid diameter as the denominator. CONTRAST:  32mL GADAVIST GADOBUTROL 1 MMOL/ML IV SOLN COMPARISON:  None. FINDINGS: MRI HEAD FINDINGS Brain: Acute infarcts within the left greater than right occipital lobes, confluent in left. Additional punctate infarct in the right temporal lobe and multiple punctate infarcts in bilateral frontal white matter. Single punctate infarct in the right cerebellum. These infarcts are associated with edema, greatest in the left occipital lobe. No significant mass effect. No evidence of acute hemorrhage mass lesion, midline shift, hydrocephalus, or extra-axial fluid collection. Cerebral atrophy. A small infarct in the right occipital lobe demonstrates mild enhancement. Also, linear enhancement in the left cerebellum in a  region of mild edema, probably subacute infarct. Otherwise, no abnormal enhancement on motion limited evaluation. Vascular: Detailed below. Skull and upper cervical spine: Normal marrow signal. Degenerative pannus at the craniocervical junction. Sinuses/Orbits: Mild paranasal sinus mucosal thickening with  inferior maxillary sinus retention cyst. MRA HEAD FINDINGS Anterior Circulation: Bilateral intracranial ICAs, MCAs, and ACAs are patent without proximal hemodynamically significant stenosis. No aneurysm identified. Posterior circulation the visualized intradural vertebral arteries basilar artery, and posterior cerebral arteries are patent without proximal hemodynamically significant stenosis. No aneurysm identified. MRA NECK FINDINGS Motion limited. Aorta: Great vessel origins are incompletely imaged. Carotid system: Bilateral common carotid internal carotid arteries are patent. Atherosclerosis at the left carotid bifurcation without greater than 50% stenosis relative to the distal vessel. Vertebral arteries: Left dominant. No evidence of significant (greater than 50%) stenosis IMPRESSION: MRI: 1. Acute, confluent infarct in the left occipital lobe (left PCA territory). Multiple additional small infarcts in the right occipital lobe, bilateral frontal white matter, right temporal lobe, and right cerebellum, as detailed above. Given involvement of multiple vascular territories, consider embolic etiology. 2. Mild cortical right occipital and left cerebellar enhancement, probably related to subacute infarcts given the above findings and appearence. MRA head: No large vessel occlusion or proximal hemodynamically significant stenosis. MRA neck: Motion limited without evidence of significant (greater than 50%) stenosis Electronically Signed   By: Margaretha Sheffield M.D.   On: 03/17/2021 18:38   MR ANGIO NECK W WO CONTRAST  Result Date: 03/17/2021 CLINICAL DATA:  Transient ischemic attack (TIA). Stroke, follow up. Neuro deficit, acute, stroke. suspected R hemianopsia. EXAM: MRI HEAD WITHOUT AND WITH CONTRAST MRA HEAD WITHOUT CONTRAST MRA NECK WITHOUT AND WITH CONTRAST TECHNIQUE: Multiplanar, multiecho pulse sequences of the brain and surrounding structures were obtained without and with intravenous contrast. Angiographic  images of the Circle of Willis were obtained using MRA technique without intravenous contrast. Angiographic images of the neck were obtained using MRA technique without and with intravenous contrast. Carotid stenosis measurements (when applicable) are obtained utilizing NASCET criteria, using the distal internal carotid diameter as the denominator. CONTRAST:  73mL GADAVIST GADOBUTROL 1 MMOL/ML IV SOLN COMPARISON:  None. FINDINGS: MRI HEAD FINDINGS Brain: Acute infarcts within the left greater than right occipital lobes, confluent in left. Additional punctate infarct in the right temporal lobe and multiple punctate infarcts in bilateral frontal white matter. Single punctate infarct in the right cerebellum. These infarcts are associated with edema, greatest in the left occipital lobe. No significant mass effect. No evidence of acute hemorrhage mass lesion, midline shift, hydrocephalus, or extra-axial fluid collection. Cerebral atrophy. A small infarct in the right occipital lobe demonstrates mild enhancement. Also, linear enhancement in the left cerebellum in a region of mild edema, probably subacute infarct. Otherwise, no abnormal enhancement on motion limited evaluation. Vascular: Detailed below. Skull and upper cervical spine: Normal marrow signal. Degenerative pannus at the craniocervical junction. Sinuses/Orbits: Mild paranasal sinus mucosal thickening with inferior maxillary sinus retention cyst. MRA HEAD FINDINGS Anterior Circulation: Bilateral intracranial ICAs, MCAs, and ACAs are patent without proximal hemodynamically significant stenosis. No aneurysm identified. Posterior circulation the visualized intradural vertebral arteries basilar artery, and posterior cerebral arteries are patent without proximal hemodynamically significant stenosis. No aneurysm identified. MRA NECK FINDINGS Motion limited. Aorta: Great vessel origins are incompletely imaged. Carotid system: Bilateral common carotid internal carotid  arteries are patent. Atherosclerosis at the left carotid bifurcation without greater than 50% stenosis relative to the distal vessel. Vertebral arteries: Left dominant. No evidence of significant (greater than 50%) stenosis  IMPRESSION: MRI: 1. Acute, confluent infarct in the left occipital lobe (left PCA territory). Multiple additional small infarcts in the right occipital lobe, bilateral frontal white matter, right temporal lobe, and right cerebellum, as detailed above. Given involvement of multiple vascular territories, consider embolic etiology. 2. Mild cortical right occipital and left cerebellar enhancement, probably related to subacute infarcts given the above findings and appearence. MRA head: No large vessel occlusion or proximal hemodynamically significant stenosis. MRA neck: Motion limited without evidence of significant (greater than 50%) stenosis Electronically Signed   By: Margaretha Sheffield M.D.   On: 03/17/2021 18:38   MR BRAIN W WO CONTRAST  Result Date: 03/17/2021 CLINICAL DATA:  Transient ischemic attack (TIA). Stroke, follow up. Neuro deficit, acute, stroke. suspected R hemianopsia. EXAM: MRI HEAD WITHOUT AND WITH CONTRAST MRA HEAD WITHOUT CONTRAST MRA NECK WITHOUT AND WITH CONTRAST TECHNIQUE: Multiplanar, multiecho pulse sequences of the brain and surrounding structures were obtained without and with intravenous contrast. Angiographic images of the Circle of Willis were obtained using MRA technique without intravenous contrast. Angiographic images of the neck were obtained using MRA technique without and with intravenous contrast. Carotid stenosis measurements (when applicable) are obtained utilizing NASCET criteria, using the distal internal carotid diameter as the denominator. CONTRAST:  37mL GADAVIST GADOBUTROL 1 MMOL/ML IV SOLN COMPARISON:  None. FINDINGS: MRI HEAD FINDINGS Brain: Acute infarcts within the left greater than right occipital lobes, confluent in left. Additional punctate  infarct in the right temporal lobe and multiple punctate infarcts in bilateral frontal white matter. Single punctate infarct in the right cerebellum. These infarcts are associated with edema, greatest in the left occipital lobe. No significant mass effect. No evidence of acute hemorrhage mass lesion, midline shift, hydrocephalus, or extra-axial fluid collection. Cerebral atrophy. A small infarct in the right occipital lobe demonstrates mild enhancement. Also, linear enhancement in the left cerebellum in a region of mild edema, probably subacute infarct. Otherwise, no abnormal enhancement on motion limited evaluation. Vascular: Detailed below. Skull and upper cervical spine: Normal marrow signal. Degenerative pannus at the craniocervical junction. Sinuses/Orbits: Mild paranasal sinus mucosal thickening with inferior maxillary sinus retention cyst. MRA HEAD FINDINGS Anterior Circulation: Bilateral intracranial ICAs, MCAs, and ACAs are patent without proximal hemodynamically significant stenosis. No aneurysm identified. Posterior circulation the visualized intradural vertebral arteries basilar artery, and posterior cerebral arteries are patent without proximal hemodynamically significant stenosis. No aneurysm identified. MRA NECK FINDINGS Motion limited. Aorta: Great vessel origins are incompletely imaged. Carotid system: Bilateral common carotid internal carotid arteries are patent. Atherosclerosis at the left carotid bifurcation without greater than 50% stenosis relative to the distal vessel. Vertebral arteries: Left dominant. No evidence of significant (greater than 50%) stenosis IMPRESSION: MRI: 1. Acute, confluent infarct in the left occipital lobe (left PCA territory). Multiple additional small infarcts in the right occipital lobe, bilateral frontal white matter, right temporal lobe, and right cerebellum, as detailed above. Given involvement of multiple vascular territories, consider embolic etiology. 2. Mild  cortical right occipital and left cerebellar enhancement, probably related to subacute infarcts given the above findings and appearence. MRA head: No large vessel occlusion or proximal hemodynamically significant stenosis. MRA neck: Motion limited without evidence of significant (greater than 50%) stenosis Electronically Signed   By: Margaretha Sheffield M.D.   On: 03/17/2021 18:38   NM PET Image Initial (PI) Skull Base To Thigh  Result Date: 02/22/2021 CLINICAL DATA:  Initial treatment strategy for pulmonary nodules. EXAM: NUCLEAR MEDICINE PET SKULL BASE TO THIGH TECHNIQUE: 7.7 mCi F-18 FDG was  injected intravenously. Full-ring PET imaging was performed from the skull base to thigh after the radiotracer. CT data was obtained and used for attenuation correction and anatomic localization. Fasting blood glucose: 131 mg/dl COMPARISON:  CT chest 02/03/2021 FINDINGS: Mediastinal blood pool activity: SUV max 7.7 Liver activity: SUV max 131 NECK: No significant abnormal hypermetabolic activity in this region. Incidental CT findings: Chronic bilateral maxillary sinusitis. Left common carotid atherosclerotic calcification. CHEST: Hypermetabolic pulmonary nodules are present. Index right lower lobe nodule 1.9 by 1.5 cm on image 38 series 8, maximum SUV 9.6. A right upper lobe pulmonary nodule measuring 1.2 by 1.0 cm on image 23 series 8 has a maximum SUV of 4.8. A 0.9 by 0.7 cm left lower lobe nodule near the diaphragm on image 57 of series 8 has a maximum SUV of 2.0. Other smaller nodules are present. Hypermetabolic bilateral supraclavicular, right paratracheal, AP window, paraesophageal, subcarinal, right hilar, left hilar, and periaortic adenopathy is observed. Index right paratracheal node 1.2 cm in short axis on image 65 series 4, maximum SUV 7.3. Index conglomerate AP window adenopathy 1.8 cm in short axis on image 69 series 4, maximum SUV 15.7. Incidental CT findings: Suspected underlying emphysema. Coronary, aortic  arch, and branch vessel atherosclerotic vascular disease. The right apical pleural thickening shown on prior CT is no longer well seen. ABDOMEN/PELVIS: Hypermetabolic right gastric, porta hepatis, peripancreatic, portacaval, retroperitoneal, mesenteric, and right common iliac adenopathy. Index left periaortic node 1.1 cm in short axis on image 136 series 4, maximum SUV 19.2. Index portacaval node 1.1 cm in short axis on image 114 series 4, maximum SUV 17.5. Index right common iliac node 1.0 cm in short axis on image 151 series 4, maximum SUV 16.9. Scattered accentuated activity in bowel, likely physiologic. Incidental CT findings: Dilated dorsal pancreatic duct with a 1.3 by 0.9 cm calcification along the dorsal pancreatic duct in the pancreatic head. Photopenic right renal cysts. Nonobstructive right nephrolithiasis. Atherosclerosis is present, including aortoiliac atherosclerotic disease. Indirect right inguinal hernia containing loops of distal ileum extending down into the scrotum. SKELETON: A 5 mm sclerotic lesion posteriorly in the T11 vertebral body on image 103 of series 4 has mildly accentuated metabolic activity with maximum SUV of 3.4, concerning for oligometastasic osseous involvement. Accentuated activity at the site of what appears to be a healing fracture of the right anterior second rib, maximum SUV 3.1, probably benign. Similar findings in the right anterior third rib (maximum SUV 2.0) and right anterior fourth rib (maximum SUV 4.0 and right anterior fifth rib (maximum SUV 3.6) multiple old healed left-sided rib fractures are present. Incidental CT findings: none IMPRESSION: 1. The three largest pulmonary nodules including the 1.9 by 1.5 cm right lower lobe nodule are hypermetabolic, and there is substantial hypermetabolic adenopathy in the chest and upper abdomen. Likewise there is a small focus of hypermetabolic activity corresponding to sclerotic density in the T11 vertebral body. Appearance  compatible with active malignancy, primary suspicion is for metastatic lung cancer. 2. Other imaging findings of potential clinical significance: Aortic Atherosclerosis (ICD10-I70.0) and Emphysema (ICD10-J43.9). Coronary atherosclerosis. Nonobstructive right nephrolithiasis. Dilated dorsal pancreatic duct with a large stone or calcification along the pancreatic head. Indirect right inguinal hernia contains loops of terminal ileum extending down towards the scrotum. Healing right anterior rib fractures with old healed left rib fractures. Electronically Signed   By: Van Clines M.D.   On: 02/22/2021 08:22   CT HEAD CODE STROKE WO CONTRAST`  Result Date: 03/17/2021 CLINICAL DATA:  Code stroke.  81 year old male with right eye loss of vision upon waking. EXAM: CT HEAD WITHOUT CONTRAST TECHNIQUE: Contiguous axial images were obtained from the base of the skull through the vertex without intravenous contrast. RADIATION DOSE REDUCTION: This exam was performed according to the departmental dose-optimization program which includes automated exposure control, adjustment of the mA and/or kV according to patient size and/or use of iterative reconstruction technique. COMPARISON:  Head CT 02/04/2021. FINDINGS: Brain: Patchy cytotoxic edema in the left occipital pole on series 3 image 15. No hemorrhagic transformation or mass effect. Elsewhere Stable gray-white matter differentiation throughout the brain. No midline shift, ventriculomegaly, mass effect, evidence of mass lesion, intracranial hemorrhage or other cortically based acute infarction. Vascular: Extensive Calcified atherosclerosis at the skull base. No suspicious intracranial vascular hyperdensity. Skull: No acute osseous abnormality identified. Sinuses/Orbits: Visualized paranasal sinuses and mastoids are stable and well aerated. Other: Orbits soft tissues appears stable and negative. Visualized scalp soft tissues are within normal limits. ASPECTS [facility]  Stroke Program Early CT Score) Total score (0-10 with 10 being normal): 10 (left PCA territory) IMPRESSION: 1. Cytotoxic edema in the left occipital pole compatible with acute to subacute Left PCA territory infarct. No hemorrhagic transformation or mass effect. 2. Otherwise stable since 02/04/2021. 3. These results were communicated to Dr. Quinn Axe at 11:14 am on 03/17/2021 by text page via the [facility] messaging system. Electronically Signed   By: Genevie Ann M.D.   On: 03/17/2021 11:19     PHYSICAL EXAM  Temp:  [97.8 F (36.6 C)-98.2 F (36.8 C)] 98.2 F (36.8 C) (02/19 0751) Pulse Rate:  [72-107] 107 (02/19 0751) Resp:  [16-18] 18 (02/19 0751) BP: (105-156)/(60-67) 111/64 (02/19 0751) SpO2:  [97 %-100 %] 97 % (02/19 0751)  General - Well nourished, well developed, in no apparent distress.  Ophthalmologic - fundi not visualized due to noncooperation.  Cardiovascular - Regular rhythm and rate.  Mental Status -  Level of arousal and orientation to time, place, and person were intact. Language including expression, naming, repetition, comprehension was assessed and found intact. Fund of Knowledge was assessed and was intact.  Cranial Nerves II - XII - II - Visual field exam showed right hemianopia with exception of right lower quadrant can see hand waving but not counting fingers. Right upper quadrant not able to see either. III, IV, VI - Extraocular movements intact. V - Facial sensation intact bilaterally. VII - Facial movement intact bilaterally. VIII - Hearing & vestibular intact bilaterally. X - Palate elevates symmetrically. XI - Chin turning & shoulder shrug intact bilaterally. XII - Tongue protrusion intact.  Motor Strength - The patients strength was normal in all extremities and pronator drift was absent.  Bulk was normal and fasciculations were absent.   Motor Tone - Muscle tone was assessed at the neck and appendages and was normal.  Reflexes - The patients reflexes were  symmetrical in all extremities and he had no pathological reflexes.  Sensory - Light touch, temperature/pinprick were assessed and were symmetrical.    Coordination - The patient had normal movements in the hands with no ataxia or dysmetria.  Tremor was absent.  Gait and Station - deferred.   ASSESSMENT/PLAN Arthur [name] is a 81 y.o. male with history of HTN, HLD, DM, PE on eliquis, cardiac arrest and CAD s/p stent admitted for elective lung nodule biopsy. No tPA given due to on eliquis.    Stroke:  embolic shower with largest at left PCA infarct, concerning for cardioembolic source CT left  occipital hypodensity MRA head and neck unremarkable MRI  left PCA infarcts with b/l watershed punctate infarcts 2D Echo  pending LDL 75.9 HgbA1c 6.2 eliquis for VTE prophylaxis clopidogrel 75 mg daily and Eliquis (apixaban) daily prior to admission, now on Eliquis (apixaban) daily and Cangrelor IV .  Patient counseled to be compliant with his antithrombotic medications Ongoing aggressive stroke risk factor management Therapy recommendations:  pending  Disposition:  pending  Lung nodule  Pending biopsy Now on eliquis, plan to switch to lovenox pre procedure Plavix now switched to Cangrelor IV  Recent PE complicated by cardiac arrest and PCI On eliquis and cangrelor Given current embolic stroke, pt seems failed eliquis Further regimen may be considered.  LE venous doppler pending  Diabetes HgbA1c 6.2 goal < 7.0 Controlled Currently on metformin CBG monitoring SSI close PCP follow up  Hypertension Stable Long term BP goal normotensive  Hyperlipidemia Home meds:  crestor 20  LDL 75.9, goal < 70 Now on crestor 40 Continue statin at discharge  Other Stroke Risk Factors Advanced age  Other Active Problems Gout - doing well now  Hospital day # 3    Rosalin Hawking, MD PhD Stroke Neurology 03/18/2021 11:00 AM    To contact Stroke Continuity provider, please refer  to [email or web address] After hours, contact General Neurology

## 2021-03-18 NOTE — Progress Notes (Signed)
FPTS Brief Progress Note  S: Pt resting comfortably, trying to get to sleep.  No concerns at this time  O: BP (!) 121/51 (BP Location: Left Arm)    Pulse 77    Temp 98.1 F (36.7 C) (Oral)    Resp 18    Ht 5\' 11"  (1.803 m)    Wt 69.5 kg    SpO2 97%    BMI 21.38 kg/m   General: lying in bed, NAD Respiratory: breathing comfortably on room air  A/P: Continue to follow plan outlined in day teams progress note - Orders reviewed. Labs for AM ordered, which was adjusted as needed.    Precious Gilding, DO 03/18/2021, 11:28 PM PGY-1, [facility] Night Resident  Please page [phone] with questions.

## 2021-03-18 NOTE — Progress Notes (Signed)
Family Medicine Teaching Service Daily Progress Note Intern Pager: [phone]  Patient name: Arthur [name] Medical record number: [id number] Date of birth: 1940/11/07 Age: 81 y.o. Gender: male  Primary Care Provider: Lind Covert, MD Consultants: PCCM Code Status: Full  Pt Overview and Major Events to Date:  2/16-admitted 2/17-clopidogrel held and IV cangrelor started  Assessment and Plan: Arthur [name] is a 81 y.o. male admitted for reversible anticoagulation therapy to have lung biopsy for suspicious pulmonary nodules.  PMH significant for cardiac arrest, CAD s/p PCI, PE on apixaban, gout, HTN, T2DM.   Pulmonary nodules Currently undergoing Clopidogrel and Apixaban washout - Pulm following, biopsy planned for 2/22 - Transition to IV heparin 2 days prior to biopsy  - Continue IV cangrelor   Acute/subacute left PCA infarct CT head - acute/subacute left PCA infarct, no hemorrhage, MRI/A - acute left PCA infarct with other small infarcts (likely embolic), no LVO, no stenosis. This morning patient and daughter report his vision improved - continue current anticoagulation. No changes   Hx PE   CAD s/p PCI with DES - Anticoagulation/antiplatelet therapy as above - Continue Crestor 20 mg daily - Continue atenolol 50 mg twice daily  HTN BP 105/63. Will hold HCTZ given gout, continue to hold Allopurinol, and restart Lostartan at 50mg  daily   T2DM CBG 140 this am  - Continue home Metformin 1000 mg BID   Gout - Continue colchicine 0.6mg  daily, - restart Allopurinol    FEN/GI: Regular diet PPx: Apixaban   Disposition: Awaiting lung biopsy   Subjective:  No acute events overnight, feels well. Daughter asks about MRI results which I shared with her that it confirmed CT findings. She said they felt his vision improved s  Objective: Temp:  [97.4 F (36.3 C)-98.2 F (36.8 C)] 97.8 F (36.6 C) (02/18 2359) Pulse Rate:  [70-74] 72 (02/18 2359) Resp:  [16-19] 16  (02/18 2359) BP: (112-156)/(60-82) 127/64 (02/18 2359) SpO2:  [98 %-100 %] 100 % (02/18 2359) Physical Exam: General: alert, pleasant, NAD Cardiovascular: RRR no murmurs  Respiratory: CTAB normal WOB  Abdomen: soft, non tender, non distended  Extremities: no visible rashes or lesions. No Le edema   Laboratory: Recent Labs  Lab 03/16/21 0435  WBC 12.6*  HGB 11.1*  HCT 35.3*  PLT 281   Recent Labs  Lab 03/16/21 0435  NA 135  K 4.4  CL 103  CO2 23  BUN 21  CREATININE 1.24  CALCIUM 9.1  PROT 6.6  BILITOT 0.6  ALKPHOS 109  ALT 14  AST 18  GLUCOSE 114*    Imaging/Diagnostic Tests:  MR ANGIO HEAD WO CONTRAST  Result Date: 03/17/2021 CLINICAL DATA:  Transient ischemic attack (TIA). Stroke, follow up. Neuro deficit, acute, stroke. suspected R hemianopsia. EXAM: MRI HEAD WITHOUT AND WITH CONTRAST MRA HEAD WITHOUT CONTRAST MRA NECK WITHOUT AND WITH CONTRAST TECHNIQUE: Multiplanar, multiecho pulse sequences of the brain and surrounding structures were obtained without and with intravenous contrast. Angiographic images of the Circle of Willis were obtained using MRA technique without intravenous contrast. Angiographic images of the neck were obtained using MRA technique without and with intravenous contrast. Carotid stenosis measurements (when applicable) are obtained utilizing NASCET criteria, using the distal internal carotid diameter as the denominator. CONTRAST:  28mL GADAVIST GADOBUTROL 1 MMOL/ML IV SOLN COMPARISON:  None. FINDINGS: MRI HEAD FINDINGS Brain: Acute infarcts within the left greater than right occipital lobes, confluent in left. Additional punctate infarct in the right temporal lobe and multiple punctate infarcts  in bilateral frontal white matter. Single punctate infarct in the right cerebellum. These infarcts are associated with edema, greatest in the left occipital lobe. No significant mass effect. No evidence of acute hemorrhage mass lesion, midline shift,  hydrocephalus, or extra-axial fluid collection. Cerebral atrophy. A small infarct in the right occipital lobe demonstrates mild enhancement. Also, linear enhancement in the left cerebellum in a region of mild edema, probably subacute infarct. Otherwise, no abnormal enhancement on motion limited evaluation. Vascular: Detailed below. Skull and upper cervical spine: Normal marrow signal. Degenerative pannus at the craniocervical junction. Sinuses/Orbits: Mild paranasal sinus mucosal thickening with inferior maxillary sinus retention cyst. MRA HEAD FINDINGS Anterior Circulation: Bilateral intracranial ICAs, MCAs, and ACAs are patent without proximal hemodynamically significant stenosis. No aneurysm identified. Posterior circulation the visualized intradural vertebral arteries basilar artery, and posterior cerebral arteries are patent without proximal hemodynamically significant stenosis. No aneurysm identified. MRA NECK FINDINGS Motion limited. Aorta: Great vessel origins are incompletely imaged. Carotid system: Bilateral common carotid internal carotid arteries are patent. Atherosclerosis at the left carotid bifurcation without greater than 50% stenosis relative to the distal vessel. Vertebral arteries: Left dominant. No evidence of significant (greater than 50%) stenosis IMPRESSION: MRI: 1. Acute, confluent infarct in the left occipital lobe (left PCA territory). Multiple additional small infarcts in the right occipital lobe, bilateral frontal white matter, right temporal lobe, and right cerebellum, as detailed above. Given involvement of multiple vascular territories, consider embolic etiology. 2. Mild cortical right occipital and left cerebellar enhancement, probably related to subacute infarcts given the above findings and appearence. MRA head: No large vessel occlusion or proximal hemodynamically significant stenosis. MRA neck: Motion limited without evidence of significant (greater than 50%) stenosis  Electronically Signed   By: Margaretha Sheffield M.D.   On: 03/17/2021 18:38   MR ANGIO NECK W WO CONTRAST  Result Date: 03/17/2021 CLINICAL DATA:  Transient ischemic attack (TIA). Stroke, follow up. Neuro deficit, acute, stroke. suspected R hemianopsia. EXAM: MRI HEAD WITHOUT AND WITH CONTRAST MRA HEAD WITHOUT CONTRAST MRA NECK WITHOUT AND WITH CONTRAST TECHNIQUE: Multiplanar, multiecho pulse sequences of the brain and surrounding structures were obtained without and with intravenous contrast. Angiographic images of the Circle of Willis were obtained using MRA technique without intravenous contrast. Angiographic images of the neck were obtained using MRA technique without and with intravenous contrast. Carotid stenosis measurements (when applicable) are obtained utilizing NASCET criteria, using the distal internal carotid diameter as the denominator. CONTRAST:  67mL GADAVIST GADOBUTROL 1 MMOL/ML IV SOLN COMPARISON:  None. FINDINGS: MRI HEAD FINDINGS Brain: Acute infarcts within the left greater than right occipital lobes, confluent in left. Additional punctate infarct in the right temporal lobe and multiple punctate infarcts in bilateral frontal white matter. Single punctate infarct in the right cerebellum. These infarcts are associated with edema, greatest in the left occipital lobe. No significant mass effect. No evidence of acute hemorrhage mass lesion, midline shift, hydrocephalus, or extra-axial fluid collection. Cerebral atrophy. A small infarct in the right occipital lobe demonstrates mild enhancement. Also, linear enhancement in the left cerebellum in a region of mild edema, probably subacute infarct. Otherwise, no abnormal enhancement on motion limited evaluation. Vascular: Detailed below. Skull and upper cervical spine: Normal marrow signal. Degenerative pannus at the craniocervical junction. Sinuses/Orbits: Mild paranasal sinus mucosal thickening with inferior maxillary sinus retention cyst. MRA HEAD  FINDINGS Anterior Circulation: Bilateral intracranial ICAs, MCAs, and ACAs are patent without proximal hemodynamically significant stenosis. No aneurysm identified. Posterior circulation the visualized intradural vertebral arteries basilar artery,  and posterior cerebral arteries are patent without proximal hemodynamically significant stenosis. No aneurysm identified. MRA NECK FINDINGS Motion limited. Aorta: Great vessel origins are incompletely imaged. Carotid system: Bilateral common carotid internal carotid arteries are patent. Atherosclerosis at the left carotid bifurcation without greater than 50% stenosis relative to the distal vessel. Vertebral arteries: Left dominant. No evidence of significant (greater than 50%) stenosis IMPRESSION: MRI: 1. Acute, confluent infarct in the left occipital lobe (left PCA territory). Multiple additional small infarcts in the right occipital lobe, bilateral frontal white matter, right temporal lobe, and right cerebellum, as detailed above. Given involvement of multiple vascular territories, consider embolic etiology. 2. Mild cortical right occipital and left cerebellar enhancement, probably related to subacute infarcts given the above findings and appearence. MRA head: No large vessel occlusion or proximal hemodynamically significant stenosis. MRA neck: Motion limited without evidence of significant (greater than 50%) stenosis Electronically Signed   By: Margaretha Sheffield M.D.   On: 03/17/2021 18:38   MR BRAIN W WO CONTRAST  Result Date: 03/17/2021 CLINICAL DATA:  Transient ischemic attack (TIA). Stroke, follow up. Neuro deficit, acute, stroke. suspected R hemianopsia. EXAM: MRI HEAD WITHOUT AND WITH CONTRAST MRA HEAD WITHOUT CONTRAST MRA NECK WITHOUT AND WITH CONTRAST TECHNIQUE: Multiplanar, multiecho pulse sequences of the brain and surrounding structures were obtained without and with intravenous contrast. Angiographic images of the Circle of Willis were obtained using MRA  technique without intravenous contrast. Angiographic images of the neck were obtained using MRA technique without and with intravenous contrast. Carotid stenosis measurements (when applicable) are obtained utilizing NASCET criteria, using the distal internal carotid diameter as the denominator. CONTRAST:  9mL GADAVIST GADOBUTROL 1 MMOL/ML IV SOLN COMPARISON:  None. FINDINGS: MRI HEAD FINDINGS Brain: Acute infarcts within the left greater than right occipital lobes, confluent in left. Additional punctate infarct in the right temporal lobe and multiple punctate infarcts in bilateral frontal white matter. Single punctate infarct in the right cerebellum. These infarcts are associated with edema, greatest in the left occipital lobe. No significant mass effect. No evidence of acute hemorrhage mass lesion, midline shift, hydrocephalus, or extra-axial fluid collection. Cerebral atrophy. A small infarct in the right occipital lobe demonstrates mild enhancement. Also, linear enhancement in the left cerebellum in a region of mild edema, probably subacute infarct. Otherwise, no abnormal enhancement on motion limited evaluation. Vascular: Detailed below. Skull and upper cervical spine: Normal marrow signal. Degenerative pannus at the craniocervical junction. Sinuses/Orbits: Mild paranasal sinus mucosal thickening with inferior maxillary sinus retention cyst. MRA HEAD FINDINGS Anterior Circulation: Bilateral intracranial ICAs, MCAs, and ACAs are patent without proximal hemodynamically significant stenosis. No aneurysm identified. Posterior circulation the visualized intradural vertebral arteries basilar artery, and posterior cerebral arteries are patent without proximal hemodynamically significant stenosis. No aneurysm identified. MRA NECK FINDINGS Motion limited. Aorta: Great vessel origins are incompletely imaged. Carotid system: Bilateral common carotid internal carotid arteries are patent. Atherosclerosis at the left carotid  bifurcation without greater than 50% stenosis relative to the distal vessel. Vertebral arteries: Left dominant. No evidence of significant (greater than 50%) stenosis IMPRESSION: MRI: 1. Acute, confluent infarct in the left occipital lobe (left PCA territory). Multiple additional small infarcts in the right occipital lobe, bilateral frontal white matter, right temporal lobe, and right cerebellum, as detailed above. Given involvement of multiple vascular territories, consider embolic etiology. 2. Mild cortical right occipital and left cerebellar enhancement, probably related to subacute infarcts given the above findings and appearence. MRA head: No large vessel occlusion or proximal hemodynamically significant stenosis.  MRA neck: Motion limited without evidence of significant (greater than 50%) stenosis Electronically Signed   By: Margaretha Sheffield M.D.   On: 03/17/2021 18:38   CT HEAD CODE STROKE WO CONTRAST`  Result Date: 03/17/2021 CLINICAL DATA:  Code stroke. 81 year old male with right eye loss of vision upon waking. EXAM: CT HEAD WITHOUT CONTRAST TECHNIQUE: Contiguous axial images were obtained from the base of the skull through the vertex without intravenous contrast. RADIATION DOSE REDUCTION: This exam was performed according to the departmental dose-optimization program which includes automated exposure control, adjustment of the mA and/or kV according to patient size and/or use of iterative reconstruction technique. COMPARISON:  Head CT 02/04/2021. FINDINGS: Brain: Patchy cytotoxic edema in the left occipital pole on series 3 image 15. No hemorrhagic transformation or mass effect. Elsewhere Stable gray-white matter differentiation throughout the brain. No midline shift, ventriculomegaly, mass effect, evidence of mass lesion, intracranial hemorrhage or other cortically based acute infarction. Vascular: Extensive Calcified atherosclerosis at the skull base. No suspicious intracranial vascular  hyperdensity. Skull: No acute osseous abnormality identified. Sinuses/Orbits: Visualized paranasal sinuses and mastoids are stable and well aerated. Other: Orbits soft tissues appears stable and negative. Visualized scalp soft tissues are within normal limits. ASPECTS [facility] Stroke Program Early CT Score) Total score (0-10 with 10 being normal): 10 (left PCA territory) IMPRESSION: 1. Cytotoxic edema in the left occipital pole compatible with acute to subacute Left PCA territory infarct. No hemorrhagic transformation or mass effect. 2. Otherwise stable since 02/04/2021. 3. These results were communicated to Dr. Quinn Axe at 11:14 am on 03/17/2021 by text page via the [facility] messaging system. Electronically Signed   By: Genevie Ann M.D.   On: 03/17/2021 11:19     Shary Key, DO 03/18/2021, 12:12 AM PGY-2, [place] Intern pager: [phone], text pages welcome

## 2021-03-18 NOTE — Progress Notes (Signed)
°  Echocardiogram 2D Echocardiogram has been performed.  Arthur [name] 03/18/2021, 5:02 PM

## 2021-03-18 NOTE — Progress Notes (Signed)
ANTICOAGULATION CONSULT NOTE - Initial Consult  Pharmacy Consult for Heparin Indication:  history of pulmonary embolus  Allergies  Allergen Reactions   Ace Inhibitors     REACTION: Cough    Patient Measurements: Height: 5\' 11"  (180.3 cm) Weight: 69.5 kg (153 lb 4.8 oz) IBW/kg (Calculated) : 75.3 kg Heparin Dosing Weight: 69.5 kg  Vital Signs: Temp: 98.2 F (36.8 C) (02/19 0751) Temp Source: Oral (02/19 0751) BP: 111/64 (02/19 0751) Pulse Rate: 107 (02/19 0751)  Labs: Recent Labs    03/16/21 0435 03/18/21 0506  HGB 11.1* 12.2*  HCT 35.3* 37.3*  PLT 281 294  CREATININE 1.24 1.37*    Estimated Creatinine Clearance: 42.3 mL/min (A) (by C-G formula based on SCr of 1.37 mg/dL (H)).   Medical History: Past Medical History:  Diagnosis Date   Diabetes mellitus without complication ([place])    High cholesterol    Hypertension    Normal nuclear stress test 2010   stress perfusion study apparently in 2010 in [facility] which he said was negative.    Assessment: 81 yo male presents for planned lung biopsy due to suspicious pulmonary nodules. PTA the patient is on apixaban for history of pulmonary embolus in 01/2021. The patient is planned for biopsy on 2/22, and the plan is for the patient to stop apixaban and transition to IV heparin 2 days prior to procedure. Pharmacy is consulted to dose heparin.   The recent apixaban administration can falsely elevate heparin levels, therefore, will monitor therapy using aPTTs until the aPTT begins to correlate with the heparin level.   Goal of Therapy:  Heparin level 0.3-0.7 units/ml Monitor platelets by anticoagulation protocol: Yes   Plan:  Continue apixaban PO 5 mg BID until tonight (last dose 2/19 @ 2200) Initiate heparin IV at 1150 units/hr starting on 2/20 @ 1000 Obtain a 8-hr aPTT after the start of heparin Monitor for signs and symptoms of bleeding Obtain a daily aPTT, heparin level, and CBC  Shauna Hugh, PharmD,  [place]  PGY-2 Pharmacy Resident 03/18/2021 9:33 AM  Please check [email or web address] for unit-specific pharmacy phone numbers.

## 2021-03-18 NOTE — Progress Notes (Addendum)
VASCULAR LAB    Bilateral lower extremity venous duplex has been performed.  See CV proc for preliminary results.  Gave verbal report to [facility], RN and messaged Dr. Erlinda Hong via secure chat  Mauro Kaufmann, [facility], RVT 03/18/2021, 10:14 AM

## 2021-03-18 NOTE — Progress Notes (Signed)
FPTS Brief Progress Note  S:Patient sleeping when I came into the room   O: BP 127/64 (BP Location: Left Arm)    Pulse 72    Temp 97.8 F (36.6 C) (Oral)    Resp 16    Ht 5\' 11"  (1.803 m)    Wt 69.5 kg    SpO2 100%    BMI 21.38 kg/m     A/P: Pulmonary nodules Currently undergoing Clopidogrel and Apixaban washout - Pulm following, biopsy planned for 2/22 - Transition to IV heparin 2/20 - Continue IV cangrelor   Remainder of plan per day progress note   - Orders reviewed. Labs for AM ordered, which was adjusted as needed.    Shary Key, DO 03/18/2021, 12:54 AM PGY-2, [place] [facility] Night Resident  Please page [phone] with questions.

## 2021-03-19 ENCOUNTER — Inpatient Hospital Stay (HOSPITAL_COMMUNITY): Payer: Medicare Other

## 2021-03-19 ENCOUNTER — Other Ambulatory Visit (HOSPITAL_COMMUNITY): Payer: Self-pay

## 2021-03-19 DIAGNOSIS — I639 Cerebral infarction, unspecified: Secondary | ICD-10-CM

## 2021-03-19 DIAGNOSIS — I82433 Acute embolism and thrombosis of popliteal vein, bilateral: Secondary | ICD-10-CM

## 2021-03-19 LAB — GLUCOSE, CAPILLARY
Glucose-Capillary: 119 mg/dL — ABNORMAL HIGH (ref 70–99)
Glucose-Capillary: 166 mg/dL — ABNORMAL HIGH (ref 70–99)
Glucose-Capillary: 92 mg/dL (ref 70–99)

## 2021-03-19 LAB — HEPARIN LEVEL (UNFRACTIONATED): Heparin Unfractionated: >1.1 IU/mL — ABNORMAL HIGH (ref 0.30–0.70)

## 2021-03-19 LAB — BASIC METABOLIC PANEL
Anion gap: 11 (ref 5–15)
BUN: 20 mg/dL (ref 8–23)
CO2: 19 mmol/L — ABNORMAL LOW (ref 22–32)
Calcium: 8.9 mg/dL (ref 8.9–10.3)
Chloride: 104 mmol/L (ref 98–111)
Creatinine, Ser: 1.31 mg/dL — ABNORMAL HIGH (ref 0.61–1.24)
GFR, Estimated: 55 mL/min — ABNORMAL LOW (ref >60)
Glucose, Bld: 112 mg/dL — ABNORMAL HIGH (ref 70–99)
Potassium: 4.3 mmol/L (ref 3.5–5.1)
Sodium: 134 mmol/L — ABNORMAL LOW (ref 135–145)

## 2021-03-19 LAB — APTT
aPTT: 39 seconds — ABNORMAL HIGH (ref 24–36)
aPTT: 176 seconds (ref 24–36)

## 2021-03-19 LAB — CBC
HCT: 35.4 % — ABNORMAL LOW (ref 39.0–52.0)
Hemoglobin: 11.6 g/dL — ABNORMAL LOW (ref 13.0–17.0)
MCH: 26.7 pg (ref 26.0–34.0)
MCHC: 32.8 g/dL (ref 30.0–36.0)
MCV: 81.4 fL (ref 80.0–100.0)
Platelets: 297 10*3/uL (ref 150–400)
RBC: 4.35 MIL/uL (ref 4.22–5.81)
RDW: 13.5 % (ref 11.5–15.5)
WBC: 14.8 10*3/uL — ABNORMAL HIGH (ref 4.0–10.5)
nRBC: 0 % (ref 0.0–0.2)

## 2021-03-19 MED ORDER — EMPAGLIFLOZIN 10 MG PO TABS
10.0000 mg | ORAL_TABLET | Freq: Every day | ORAL | Status: DC
Start: 2021-03-19 — End: 2021-03-22
  Administered 2021-03-19 – 2021-03-22 (×4): 10 mg via ORAL
  Filled 2021-03-19 (×5): qty 1

## 2021-03-19 MED ORDER — HEPARIN (PORCINE) 25000 UT/250ML-% IV SOLN
700.0000 [IU]/h | INTRAVENOUS | Status: AC
  Administered 2021-03-20: 600 [IU]/h via INTRAVENOUS
  Filled 2021-03-19 (×2): qty 250

## 2021-03-19 MED ORDER — HEPARIN (PORCINE) 25000 UT/250ML-% IV SOLN
1150.0000 [IU]/h | INTRAVENOUS | Status: DC
  Administered 2021-03-19: 1150 [IU]/h via INTRAVENOUS
  Filled 2021-03-19: qty 250

## 2021-03-19 NOTE — Progress Notes (Addendum)
Progress Note  Patient Name: Arthur [name] Date of Encounter: 03/19/2021  [facility] Cardiologist: Buford Dresser, MD    Subjective   No acute events overnight.  No complaints.  Inpatient Medications    Scheduled Meds:  allopurinol  100 mg Oral Daily   colchicine  0.6 mg Oral BID   empagliflozin  10 mg Oral Daily   losartan  25 mg Oral Daily   metFORMIN  1,000 mg Oral BID WC   multivitamin with minerals  1 tablet Oral Daily   rosuvastatin  40 mg Oral Daily   Continuous Infusions:  cangrelor 50 mg in NS 250 mL 0.75 mcg/kg/min (03/19/21 0639)   heparin 1,150 Units/hr (03/19/21 0939)   PRN Meds: acetaminophen **OR** acetaminophen, albuterol, polyethylene glycol   Vital Signs    Vitals:   03/18/21 1759 03/18/21 2231 03/19/21 0528 03/19/21 0815  BP: 124/63 (!) 121/51 117/66 118/62  Pulse: 74 77 72 79  Resp: 19 18 18 16   Temp: 98.2 F (36.8 C) 98.1 F (36.7 C) 98.6 F (37 C) 98.1 F (36.7 C)  TempSrc: Oral Oral Oral Oral  SpO2: 98% 97% 97% 100%  Weight:      Height:        Intake/Output Summary (Last 24 hours) at 03/19/2021 1106 Last data filed at 03/19/2021 0116 Gross per 24 hour  Intake 600 ml  Output --  Net 600 ml   Last 3 Weights 03/15/2021 03/13/2021 03/01/2021  Weight (lbs) 153 lb 4.8 oz (No Data) 156 lb 6.4 oz  Weight (kg) 69.536 kg (No Data) 70.943 kg      Telemetry    SR occ PVCs - Personally Reviewed  ECG    None today  Physical Exam   GEN: No acute distress.   Neck: No JVD Cardiac: RRR, no murmurs, rubs, or gallops.  Respiratory: Clear to auscultation bilaterally. GI: Soft, nontender, non-distended  MS: No edema; No deformity. Neuro:  Nonfocal  Psych: Normal affect   Labs    High Sensitivity Troponin:  No results for input(s): TROPONINIHS in the last 720 hours.   Chemistry Recent Labs  Lab 03/16/21 0435 03/18/21 0506 03/19/21 0121  NA 135 132* 134*  K 4.4 4.0 4.3  CL 103 102 104  CO2 23 18* 19*  GLUCOSE 114*  140* 112*  BUN 21 19 20   CREATININE 1.24 1.37* 1.31*  CALCIUM 9.1 8.7* 8.9  PROT 6.6  --   --   ALBUMIN 2.6*  --   --   AST 18  --   --   ALT 14  --   --   ALKPHOS 109  --   --   BILITOT 0.6  --   --   GFRNONAA 59* 52* 55*  ANIONGAP 9 12 11     Lipids No results for input(s): CHOL, TRIG, HDL, LABVLDL, LDLCALC, CHOLHDL in the last 168 hours.  Hematology Recent Labs  Lab 03/16/21 0435 03/18/21 0506 03/19/21 0121  WBC 12.6* 14.1* 14.8*  RBC 4.28 4.60 4.35  HGB 11.1* 12.2* 11.6*  HCT 35.3* 37.3* 35.4*  MCV 82.5 81.1 81.4  MCH 25.9* 26.5 26.7  MCHC 31.4 32.7 32.8  RDW 13.7 13.6 13.5  PLT 281 294 297   Thyroid No results for input(s): TSH, FREET4 in the last 168 hours.  BNPNo results for input(s): BNP, PROBNP in the last 168 hours.  DDimer No results for input(s): DDIMER in the last 168 hours.   Radiology    TTE 2/19 1.  Left ventricular ejection fraction, by estimation, is 55 to 60%. The  left ventricle has normal function. The left ventricle demonstrates  regional wall motion abnormalities (see scoring diagram/findings for  description). There is severe asymmetric  left ventricular hypertrophy of the basal-septal segment. Left ventricular  diastolic parameters are consistent with Grade II diastolic dysfunction  (pseudonormalization).   2. Right ventricular systolic function is normal. The right ventricular  size is normal. Tricuspid regurgitation signal is inadequate for assessing  PA pressure.   3. A small pericardial effusion is present. The pericardial effusion is  anterior to the right ventricle.   4. The mitral valve is grossly normal. No evidence of mitral valve  regurgitation. No evidence of mitral stenosis.   5. The aortic valve is calcified. Aortic valve regurgitation is not  visualized. Aortic valve sclerosis is present, with no evidence of aortic  valve stenosis.   6. Agitated saline contrast bubble study was negative, with no evidence  of any interatrial  shunt.   Cath 1/23 LM/ostial LAD DES x 1 Ost LAD to Prox LAD lesion is 80% stenosed.   A drug-eluting stent was successfully placed using a STENT ONYX FRONTIER [id number], postdailted to > 4 mm and optimized with IVUS.  Patient Profile     81 y.o. 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  Once biopsy done, will load with plavix.  No need for ASA while on A/C for recurrent PE Recurrent DVT/PE: On hep gtt for now, will need A/C following biopsy. HTN:  Cont losartan HL:  Cont Crestor. T2DM:  Cont Jardiance, Crestor, losartan, will be on A/C in lieu of ASA later.     For questions or updates, please contact [place] Please consult [email or web address] for contact info under

## 2021-03-19 NOTE — Progress Notes (Signed)
TCD w/ bubble study completed.   Please see CV Proc for preliminary results.   Darlin Coco, RDMS, RVT

## 2021-03-19 NOTE — Progress Notes (Signed)
STROKE TEAM PROGRESS NOTE   SUBJECTIVE (INTERVAL HISTORY) One male family member is at the bedside, and son on the phone.  Pt no acute event overnight and neuro stable still has right visual field deficit but slowly improving. LE venous doppler showed b/l acute DVT, given his stroke and acute DVT, will do TCD bubble study to rule out PFO.    OBJECTIVE Temp:  [98.1 F (36.7 C)-98.6 F (37 C)] 98.1 F (36.7 C) (02/20 0815) Pulse Rate:  [72-79] 79 (02/20 0815) Cardiac Rhythm: Heart block (02/20 0900) Resp:  [16-19] 16 (02/20 0815) BP: (117-124)/(51-66) 118/62 (02/20 0815) SpO2:  [97 %-100 %] 100 % (02/20 0815)  Recent Labs  Lab 03/18/21 0754 03/18/21 1209 03/18/21 1804 03/18/21 2232 03/19/21 0835  GLUCAP 147* 175* 152* 111* 119*   Recent Labs  Lab 03/16/21 0435 03/18/21 0506 03/19/21 0121  NA 135 132* 134*  K 4.4 4.0 4.3  CL 103 102 104  CO2 23 18* 19*  GLUCOSE 114* 140* 112*  BUN 21 19 20   CREATININE 1.24 1.37* 1.31*  CALCIUM 9.1 8.7* 8.9   Recent Labs  Lab 03/16/21 0435  AST 18  ALT 14  ALKPHOS 109  BILITOT 0.6  PROT 6.6  ALBUMIN 2.6*   Recent Labs  Lab 03/16/21 0435 03/18/21 0506 03/19/21 0121  WBC 12.6* 14.1* 14.8*  HGB 11.1* 12.2* 11.6*  HCT 35.3* 37.3* 35.4*  MCV 82.5 81.1 81.4  PLT 281 294 297   No results for input(s): CKTOTAL, CKMB, CKMBINDEX, TROPONINI in the last 168 hours. No results for input(s): LABPROT, INR in the last 72 hours. No results for input(s): COLORURINE, LABSPEC, [place], GLUCOSEU, HGBUR, BILIRUBINUR, KETONESUR, PROTEINUR, UROBILINOGEN, NITRITE, LEUKOCYTESUR in the last 72 hours.  Invalid input(s): APPERANCEUR     Component Value Date/Time   CHOL 133 02/06/2021 0202   CHOL 157 09/08/2019 1011   TRIG 97 02/06/2021 0202   HDL 43 02/06/2021 0202   HDL 41 09/08/2019 1011   CHOLHDL 3.1 02/06/2021 0202   VLDL 19 02/06/2021 0202   LDLCALC 71 02/06/2021 0202   LDLCALC 82 09/08/2019 1011   Lab Results  Component Value Date    HGBA1C 6.2 (H) 03/17/2021   No results found for: LABOPIA, COCAINSCRNUR, LABBENZ, AMPHETMU, THCU, LABBARB  No results for input(s): ETH in the last 168 hours.  I have personally reviewed the radiological images below and agree with the radiology interpretations.  MR ANGIO HEAD WO CONTRAST  Result Date: 03/17/2021 CLINICAL DATA:  Transient ischemic attack (TIA). Stroke, follow up. Neuro deficit, acute, stroke. suspected R hemianopsia. EXAM: MRI HEAD WITHOUT AND WITH CONTRAST MRA HEAD WITHOUT CONTRAST MRA NECK WITHOUT AND WITH CONTRAST TECHNIQUE: Multiplanar, multiecho pulse sequences of the brain and surrounding structures were obtained without and with intravenous contrast. Angiographic images of the Circle of Willis were obtained using MRA technique without intravenous contrast. Angiographic images of the neck were obtained using MRA technique without and with intravenous contrast. Carotid stenosis measurements (when applicable) are obtained utilizing NASCET criteria, using the distal internal carotid diameter as the denominator. CONTRAST:  39mL GADAVIST GADOBUTROL 1 MMOL/ML IV SOLN COMPARISON:  None. FINDINGS: MRI HEAD FINDINGS Brain: Acute infarcts within the left greater than right occipital lobes, confluent in left. Additional punctate infarct in the right temporal lobe and multiple punctate infarcts in bilateral frontal white matter. Single punctate infarct in the right cerebellum. These infarcts are associated with edema, greatest in the left occipital lobe. No significant mass effect. No evidence of acute  hemorrhage mass lesion, midline shift, hydrocephalus, or extra-axial fluid collection. Cerebral atrophy. A small infarct in the right occipital lobe demonstrates mild enhancement. Also, linear enhancement in the left cerebellum in a region of mild edema, probably subacute infarct. Otherwise, no abnormal enhancement on motion limited evaluation. Vascular: Detailed below. Skull and upper cervical  spine: Normal marrow signal. Degenerative pannus at the craniocervical junction. Sinuses/Orbits: Mild paranasal sinus mucosal thickening with inferior maxillary sinus retention cyst. MRA HEAD FINDINGS Anterior Circulation: Bilateral intracranial ICAs, MCAs, and ACAs are patent without proximal hemodynamically significant stenosis. No aneurysm identified. Posterior circulation the visualized intradural vertebral arteries basilar artery, and posterior cerebral arteries are patent without proximal hemodynamically significant stenosis. No aneurysm identified. MRA NECK FINDINGS Motion limited. Aorta: Great vessel origins are incompletely imaged. Carotid system: Bilateral common carotid internal carotid arteries are patent. Atherosclerosis at the left carotid bifurcation without greater than 50% stenosis relative to the distal vessel. Vertebral arteries: Left dominant. No evidence of significant (greater than 50%) stenosis IMPRESSION: MRI: 1. Acute, confluent infarct in the left occipital lobe (left PCA territory). Multiple additional small infarcts in the right occipital lobe, bilateral frontal white matter, right temporal lobe, and right cerebellum, as detailed above. Given involvement of multiple vascular territories, consider embolic etiology. 2. Mild cortical right occipital and left cerebellar enhancement, probably related to subacute infarcts given the above findings and appearence. MRA head: No large vessel occlusion or proximal hemodynamically significant stenosis. MRA neck: Motion limited without evidence of significant (greater than 50%) stenosis Electronically Signed   By: Margaretha Sheffield M.D.   On: 03/17/2021 18:38   MR ANGIO NECK W WO CONTRAST  Result Date: 03/17/2021 CLINICAL DATA:  Transient ischemic attack (TIA). Stroke, follow up. Neuro deficit, acute, stroke. suspected R hemianopsia. EXAM: MRI HEAD WITHOUT AND WITH CONTRAST MRA HEAD WITHOUT CONTRAST MRA NECK WITHOUT AND WITH CONTRAST TECHNIQUE:  Multiplanar, multiecho pulse sequences of the brain and surrounding structures were obtained without and with intravenous contrast. Angiographic images of the Circle of Willis were obtained using MRA technique without intravenous contrast. Angiographic images of the neck were obtained using MRA technique without and with intravenous contrast. Carotid stenosis measurements (when applicable) are obtained utilizing NASCET criteria, using the distal internal carotid diameter as the denominator. CONTRAST:  79mL GADAVIST GADOBUTROL 1 MMOL/ML IV SOLN COMPARISON:  None. FINDINGS: MRI HEAD FINDINGS Brain: Acute infarcts within the left greater than right occipital lobes, confluent in left. Additional punctate infarct in the right temporal lobe and multiple punctate infarcts in bilateral frontal white matter. Single punctate infarct in the right cerebellum. These infarcts are associated with edema, greatest in the left occipital lobe. No significant mass effect. No evidence of acute hemorrhage mass lesion, midline shift, hydrocephalus, or extra-axial fluid collection. Cerebral atrophy. A small infarct in the right occipital lobe demonstrates mild enhancement. Also, linear enhancement in the left cerebellum in a region of mild edema, probably subacute infarct. Otherwise, no abnormal enhancement on motion limited evaluation. Vascular: Detailed below. Skull and upper cervical spine: Normal marrow signal. Degenerative pannus at the craniocervical junction. Sinuses/Orbits: Mild paranasal sinus mucosal thickening with inferior maxillary sinus retention cyst. MRA HEAD FINDINGS Anterior Circulation: Bilateral intracranial ICAs, MCAs, and ACAs are patent without proximal hemodynamically significant stenosis. No aneurysm identified. Posterior circulation the visualized intradural vertebral arteries basilar artery, and posterior cerebral arteries are patent without proximal hemodynamically significant stenosis. No aneurysm identified.  MRA NECK FINDINGS Motion limited. Aorta: Great vessel origins are incompletely imaged. Carotid system: Bilateral common carotid internal  carotid arteries are patent. Atherosclerosis at the left carotid bifurcation without greater than 50% stenosis relative to the distal vessel. Vertebral arteries: Left dominant. No evidence of significant (greater than 50%) stenosis IMPRESSION: MRI: 1. Acute, confluent infarct in the left occipital lobe (left PCA territory). Multiple additional small infarcts in the right occipital lobe, bilateral frontal white matter, right temporal lobe, and right cerebellum, as detailed above. Given involvement of multiple vascular territories, consider embolic etiology. 2. Mild cortical right occipital and left cerebellar enhancement, probably related to subacute infarcts given the above findings and appearence. MRA head: No large vessel occlusion or proximal hemodynamically significant stenosis. MRA neck: Motion limited without evidence of significant (greater than 50%) stenosis Electronically Signed   By: Margaretha Sheffield M.D.   On: 03/17/2021 18:38   MR BRAIN W WO CONTRAST  Result Date: 03/17/2021 CLINICAL DATA:  Transient ischemic attack (TIA). Stroke, follow up. Neuro deficit, acute, stroke. suspected R hemianopsia. EXAM: MRI HEAD WITHOUT AND WITH CONTRAST MRA HEAD WITHOUT CONTRAST MRA NECK WITHOUT AND WITH CONTRAST TECHNIQUE: Multiplanar, multiecho pulse sequences of the brain and surrounding structures were obtained without and with intravenous contrast. Angiographic images of the Circle of Willis were obtained using MRA technique without intravenous contrast. Angiographic images of the neck were obtained using MRA technique without and with intravenous contrast. Carotid stenosis measurements (when applicable) are obtained utilizing NASCET criteria, using the distal internal carotid diameter as the denominator. CONTRAST:  88mL GADAVIST GADOBUTROL 1 MMOL/ML IV SOLN COMPARISON:  None.  FINDINGS: MRI HEAD FINDINGS Brain: Acute infarcts within the left greater than right occipital lobes, confluent in left. Additional punctate infarct in the right temporal lobe and multiple punctate infarcts in bilateral frontal white matter. Single punctate infarct in the right cerebellum. These infarcts are associated with edema, greatest in the left occipital lobe. No significant mass effect. No evidence of acute hemorrhage mass lesion, midline shift, hydrocephalus, or extra-axial fluid collection. Cerebral atrophy. A small infarct in the right occipital lobe demonstrates mild enhancement. Also, linear enhancement in the left cerebellum in a region of mild edema, probably subacute infarct. Otherwise, no abnormal enhancement on motion limited evaluation. Vascular: Detailed below. Skull and upper cervical spine: Normal marrow signal. Degenerative pannus at the craniocervical junction. Sinuses/Orbits: Mild paranasal sinus mucosal thickening with inferior maxillary sinus retention cyst. MRA HEAD FINDINGS Anterior Circulation: Bilateral intracranial ICAs, MCAs, and ACAs are patent without proximal hemodynamically significant stenosis. No aneurysm identified. Posterior circulation the visualized intradural vertebral arteries basilar artery, and posterior cerebral arteries are patent without proximal hemodynamically significant stenosis. No aneurysm identified. MRA NECK FINDINGS Motion limited. Aorta: Great vessel origins are incompletely imaged. Carotid system: Bilateral common carotid internal carotid arteries are patent. Atherosclerosis at the left carotid bifurcation without greater than 50% stenosis relative to the distal vessel. Vertebral arteries: Left dominant. No evidence of significant (greater than 50%) stenosis IMPRESSION: MRI: 1. Acute, confluent infarct in the left occipital lobe (left PCA territory). Multiple additional small infarcts in the right occipital lobe, bilateral frontal white matter, right  temporal lobe, and right cerebellum, as detailed above. Given involvement of multiple vascular territories, consider embolic etiology. 2. Mild cortical right occipital and left cerebellar enhancement, probably related to subacute infarcts given the above findings and appearence. MRA head: No large vessel occlusion or proximal hemodynamically significant stenosis. MRA neck: Motion limited without evidence of significant (greater than 50%) stenosis Electronically Signed   By: Margaretha Sheffield M.D.   On: 03/17/2021 18:38   NM PET Image Initial (  PI) Skull Base To Thigh  Result Date: 02/22/2021 CLINICAL DATA:  Initial treatment strategy for pulmonary nodules. EXAM: NUCLEAR MEDICINE PET SKULL BASE TO THIGH TECHNIQUE: 7.7 mCi F-18 FDG was injected intravenously. Full-ring PET imaging was performed from the skull base to thigh after the radiotracer. CT data was obtained and used for attenuation correction and anatomic localization. Fasting blood glucose: 131 mg/dl COMPARISON:  CT chest 02/03/2021 FINDINGS: Mediastinal blood pool activity: SUV max 7.7 Liver activity: SUV max 131 NECK: No significant abnormal hypermetabolic activity in this region. Incidental CT findings: Chronic bilateral maxillary sinusitis. Left common carotid atherosclerotic calcification. CHEST: Hypermetabolic pulmonary nodules are present. Index right lower lobe nodule 1.9 by 1.5 cm on image 38 series 8, maximum SUV 9.6. A right upper lobe pulmonary nodule measuring 1.2 by 1.0 cm on image 23 series 8 has a maximum SUV of 4.8. A 0.9 by 0.7 cm left lower lobe nodule near the diaphragm on image 57 of series 8 has a maximum SUV of 2.0. Other smaller nodules are present. Hypermetabolic bilateral supraclavicular, right paratracheal, AP window, paraesophageal, subcarinal, right hilar, left hilar, and periaortic adenopathy is observed. Index right paratracheal node 1.2 cm in short axis on image 65 series 4, maximum SUV 7.3. Index conglomerate AP window  adenopathy 1.8 cm in short axis on image 69 series 4, maximum SUV 15.7. Incidental CT findings: Suspected underlying emphysema. Coronary, aortic arch, and branch vessel atherosclerotic vascular disease. The right apical pleural thickening shown on prior CT is no longer well seen. ABDOMEN/PELVIS: Hypermetabolic right gastric, porta hepatis, peripancreatic, portacaval, retroperitoneal, mesenteric, and right common iliac adenopathy. Index left periaortic node 1.1 cm in short axis on image 136 series 4, maximum SUV 19.2. Index portacaval node 1.1 cm in short axis on image 114 series 4, maximum SUV 17.5. Index right common iliac node 1.0 cm in short axis on image 151 series 4, maximum SUV 16.9. Scattered accentuated activity in bowel, likely physiologic. Incidental CT findings: Dilated dorsal pancreatic duct with a 1.3 by 0.9 cm calcification along the dorsal pancreatic duct in the pancreatic head. Photopenic right renal cysts. Nonobstructive right nephrolithiasis. Atherosclerosis is present, including aortoiliac atherosclerotic disease. Indirect right inguinal hernia containing loops of distal ileum extending down into the scrotum. SKELETON: A 5 mm sclerotic lesion posteriorly in the T11 vertebral body on image 103 of series 4 has mildly accentuated metabolic activity with maximum SUV of 3.4, concerning for oligometastasic osseous involvement. Accentuated activity at the site of what appears to be a healing fracture of the right anterior second rib, maximum SUV 3.1, probably benign. Similar findings in the right anterior third rib (maximum SUV 2.0) and right anterior fourth rib (maximum SUV 4.0 and right anterior fifth rib (maximum SUV 3.6) multiple old healed left-sided rib fractures are present. Incidental CT findings: none IMPRESSION: 1. The three largest pulmonary nodules including the 1.9 by 1.5 cm right lower lobe nodule are hypermetabolic, and there is substantial hypermetabolic adenopathy in the chest and upper  abdomen. Likewise there is a small focus of hypermetabolic activity corresponding to sclerotic density in the T11 vertebral body. Appearance compatible with active malignancy, primary suspicion is for metastatic lung cancer. 2. Other imaging findings of potential clinical significance: Aortic Atherosclerosis (ICD10-I70.0) and Emphysema (ICD10-J43.9). Coronary atherosclerosis. Nonobstructive right nephrolithiasis. Dilated dorsal pancreatic duct with a large stone or calcification along the pancreatic head. Indirect right inguinal hernia contains loops of terminal ileum extending down towards the scrotum. Healing right anterior rib fractures with old healed left rib  fractures. Electronically Signed   By: Van Clines M.D.   On: 02/22/2021 08:22   ECHOCARDIOGRAM COMPLETE BUBBLE STUDY  Result Date: 03/18/2021    ECHOCARDIOGRAM REPORT   Patient Name:   [name] Date of Exam: 03/18/2021 Medical Rec #:  [id number]         Height:       71.0 in Accession #:    [id number]        Weight:       153.3 lb Date of Birth:  08/29/1940          BSA:          1.883 m Patient Age:    81 years          BP:           111/64 mmHg Patient Gender: M                 HR:           78 bpm. Exam Location:  Inpatient Procedure: 2D Echo, Cardiac Doppler, Color Doppler and Saline Contrast Bubble            Study Indications:    Stroke  History:        Patient has prior history of Echocardiogram examinations, most                 recent 02/04/2021. Risk Factors:Diabetes, Hypertension and                 Dyslipidemia. Hx DVT and pulmonary embolus.  Sonographer:    Clayton Lefort RDCS (AE) Referring Phys: [place]  1. Left ventricular ejection fraction, by estimation, is 55 to 60%. The left ventricle has normal function. The left ventricle demonstrates regional wall motion abnormalities (see scoring diagram/findings for description). There is severe asymmetric left ventricular hypertrophy of the basal-septal  segment. Left ventricular diastolic parameters are consistent with Grade II diastolic dysfunction (pseudonormalization).  2. Right ventricular systolic function is normal. The right ventricular size is normal. Tricuspid regurgitation signal is inadequate for assessing PA pressure.  3. A small pericardial effusion is present. The pericardial effusion is anterior to the right ventricle.  4. The mitral valve is grossly normal. No evidence of mitral valve regurgitation. No evidence of mitral stenosis.  5. The aortic valve is calcified. Aortic valve regurgitation is not visualized. Aortic valve sclerosis is present, with no evidence of aortic valve stenosis.  6. Agitated saline contrast bubble study was negative, with no evidence of any interatrial shunt. Comparison(s): Slight improvementment in LV and RV function with persistent WMA. FINDINGS  Left Ventricle: Left ventricular ejection fraction, by estimation, is 55 to 60%. The left ventricle has normal function. The left ventricle demonstrates regional wall motion abnormalities. The left ventricular internal cavity size was small. There is severe asymmetric left ventricular hypertrophy of the basal-septal segment. Left ventricular diastolic parameters are consistent with Grade II diastolic dysfunction (pseudonormalization).  LV Wall Scoring: The inferior wall and basal inferolateral segment are hypokinetic. Right Ventricle: The right ventricular size is normal. No increase in right ventricular wall thickness. Right ventricular systolic function is normal. Tricuspid regurgitation signal is inadequate for assessing PA pressure. Left Atrium: Left atrial size was normal in size. Right Atrium: Right atrial size was normal in size. Pericardium: A small pericardial effusion is present. The pericardial effusion is anterior to the right ventricle. Mitral Valve: The mitral valve is grossly normal. No evidence of mitral valve regurgitation. No  evidence of mitral valve stenosis. MV  peak gradient, 3.0 mmHg. The mean mitral valve gradient is 2.0 mmHg. Tricuspid Valve: The tricuspid valve is normal in structure. Tricuspid valve regurgitation is trivial. No evidence of tricuspid stenosis. Aortic Valve: The aortic valve is calcified. Aortic valve regurgitation is not visualized. Aortic valve sclerosis is present, with no evidence of aortic valve stenosis. Aortic valve mean gradient measures 5.0 mmHg. Aortic valve peak gradient measures 9.6  mmHg. Aortic valve area, by VTI measures 2.60 cm. Pulmonic Valve: The pulmonic valve was normal in structure. Pulmonic valve regurgitation is not visualized. No evidence of pulmonic stenosis. Aorta: The aortic root and ascending aorta are structurally normal, with no evidence of dilitation. IAS/Shunts: No atrial level shunt detected by color flow Doppler. Agitated saline contrast was given intravenously to evaluate for intracardiac shunting. Agitated saline contrast bubble study was negative, with no evidence of any interatrial shunt.  LEFT VENTRICLE PLAX 2D LVIDd:         3.10 cm   Diastology LVIDs:         2.10 cm   LV e' medial:    4.68 cm/s LV PW:         1.60 cm   LV E/e' medial:  20.3 LV IVS:        1.40 cm   LV e' lateral:   6.42 cm/s LVOT diam:     1.90 cm   LV E/e' lateral: 14.8 LV SV:         75 LV SV Index:   40 LVOT Area:     2.84 cm  RIGHT VENTRICLE             IVC RV Basal diam:  3.90 cm     IVC diam: 1.50 cm RV Mid diam:    3.30 cm RV S prime:     16.00 cm/s TAPSE (M-mode): 2.1 cm LEFT ATRIUM             Index        RIGHT ATRIUM           Index LA diam:        3.30 cm 1.75 cm/m   RA Area:     17.60 cm LA Vol (A2C):   31.1 ml 16.51 ml/m  RA Volume:   49.30 ml  26.18 ml/m LA Vol (A4C):   27.4 ml 14.55 ml/m LA Biplane Vol: 30.4 ml 16.14 ml/m  AORTIC VALVE AV Area (Vmax):    2.34 cm AV Area (Vmean):   2.48 cm AV Area (VTI):     2.60 cm AV Vmax:           155.00 cm/s AV Vmean:          101.000 cm/s AV VTI:            0.288 m AV Peak Grad:       9.6 mmHg AV Mean Grad:      5.0 mmHg LVOT Vmax:         128.00 cm/s LVOT Vmean:        88.500 cm/s LVOT VTI:          0.264 m LVOT/AV VTI ratio: 0.92  AORTA Ao Root diam: 3.50 cm Ao Asc diam:  3.20 cm MITRAL VALVE MV Area (PHT): 2.94 cm    SHUNTS MV Area VTI:   2.69 cm    Systemic VTI:  0.26 m MV Peak grad:  3.0 mmHg    Systemic Diam: 1.90 cm  MV Mean grad:  2.0 mmHg MV Vmax:       0.87 m/s MV Vmean:      63.8 cm/s MV Decel Time: 258 msec MV E velocity: 95.00 cm/s MV A velocity: 79.50 cm/s MV E/A ratio:  1.19 Rudean Haskell MD Electronically signed by Rudean Haskell MD Signature Date/Time: 03/18/2021/5:24:54 PM    Final    CT HEAD CODE STROKE WO CONTRAST`  Result Date: 03/17/2021 CLINICAL DATA:  Code stroke. 81 year old male with right eye loss of vision upon waking. EXAM: CT HEAD WITHOUT CONTRAST TECHNIQUE: Contiguous axial images were obtained from the base of the skull through the vertex without intravenous contrast. RADIATION DOSE REDUCTION: This exam was performed according to the departmental dose-optimization program which includes automated exposure control, adjustment of the mA and/or kV according to patient size and/or use of iterative reconstruction technique. COMPARISON:  Head CT 02/04/2021. FINDINGS: Brain: Patchy cytotoxic edema in the left occipital pole on series 3 image 15. No hemorrhagic transformation or mass effect. Elsewhere Stable gray-white matter differentiation throughout the brain. No midline shift, ventriculomegaly, mass effect, evidence of mass lesion, intracranial hemorrhage or other cortically based acute infarction. Vascular: Extensive Calcified atherosclerosis at the skull base. No suspicious intracranial vascular hyperdensity. Skull: No acute osseous abnormality identified. Sinuses/Orbits: Visualized paranasal sinuses and mastoids are stable and well aerated. Other: Orbits soft tissues appears stable and negative. Visualized scalp soft tissues are within normal  limits. ASPECTS [facility] Stroke Program Early CT Score) Total score (0-10 with 10 being normal): 10 (left PCA territory) IMPRESSION: 1. Cytotoxic edema in the left occipital pole compatible with acute to subacute Left PCA territory infarct. No hemorrhagic transformation or mass effect. 2. Otherwise stable since 02/04/2021. 3. These results were communicated to Dr. Quinn Axe at 11:14 am on 03/17/2021 by text page via the [facility] messaging system. Electronically Signed   By: Genevie Ann M.D.   On: 03/17/2021 11:19   VAS [place] LOWER EXTREMITY VENOUS (DVT)  Result Date: 03/18/2021  Lower Venous DVT Study Patient Name:  BURT [facility]  Date of Exam:   03/18/2021 Medical Rec #: [id number]          Accession #:    [id number] Date of Birth: 10/02/1940           Patient Gender: M Patient Age:   58 years Exam Location:  [facility] Procedure:      VAS [place] LOWER EXTREMITY VENOUS (DVT) Referring Phys: Cornelius Moras Nainika Newlun --------------------------------------------------------------------------------  Indications: Stroke, and pulmonary embolism.  Risk Factors: DVT 2017 Probable new lung cancer, awaiting biopsy. Comparison Study: Prior bilateral LEV done 05/19/15 with DVT noted in the right                   PT and Soleal veins Performing Technologist: Sharion Dove RVS  Examination Guidelines: A complete evaluation includes B-mode imaging, spectral Doppler, color Doppler, and power Doppler as needed of all accessible portions of each vessel. Bilateral testing is considered an integral part of a complete examination. Limited examinations for reoccurring indications may be performed as noted. The reflux portion of the exam is performed with the patient in reverse Trendelenburg.  +---------+---------------+---------+-----------+----------+--------------+  RIGHT     Compressibility Phasicity Spontaneity Properties Thrombus Aging  +---------+---------------+---------+-----------+----------+--------------+  CFV       Full            Yes       Yes                                     +---------+---------------+---------+-----------+----------+--------------+  SFJ       Full                                                             +---------+---------------+---------+-----------+----------+--------------+  FV Prox   Full                                                             +---------+---------------+---------+-----------+----------+--------------+  FV Mid    Full                                                             +---------+---------------+---------+-----------+----------+--------------+  FV Distal Full                                                             +---------+---------------+---------+-----------+----------+--------------+  PFV       Full                                                             +---------+---------------+---------+-----------+----------+--------------+  POP       Partial         No        No                     Acute           +---------+---------------+---------+-----------+----------+--------------+  PTV       None                                             Acute           +---------+---------------+---------+-----------+----------+--------------+  PERO      None                                             Acute           +---------+---------------+---------+-----------+----------+--------------+  Soleal    None                                             Acute           +---------+---------------+---------+-----------+----------+--------------+   +---------+---------------+---------+-----------+----------+--------------+  LEFT      Compressibility Phasicity  Spontaneity Properties Thrombus Aging  +---------+---------------+---------+-----------+----------+--------------+  CFV       Full            Yes       Yes                                    +---------+---------------+---------+-----------+----------+--------------+  SFJ       Full                                                              +---------+---------------+---------+-----------+----------+--------------+  FV Prox   Full                                                             +---------+---------------+---------+-----------+----------+--------------+  FV Mid    Full                                                             +---------+---------------+---------+-----------+----------+--------------+  FV Distal Full                                                             +---------+---------------+---------+-----------+----------+--------------+  PFV       Full                                                             +---------+---------------+---------+-----------+----------+--------------+  POP       Partial         No        No                     Acute           +---------+---------------+---------+-----------+----------+--------------+  PTV       None                                             Acute           +---------+---------------+---------+-----------+----------+--------------+  PERO      None                                             Acute           +---------+---------------+---------+-----------+----------+--------------+  Soleal  None                                             Acute           +---------+---------------+---------+-----------+----------+--------------+    Summary: RIGHT: - Findings consistent with acute deep vein thrombosis involving the right popliteal vein, right posterior tibial veins, right peroneal veins, and right soleal veins.  LEFT: - Findings consistent with acute deep vein thrombosis involving the left posterior tibial veins, left popliteal vein, left peroneal veins, and left soleal veins.  *See table(s) above for measurements and observations.    Preliminary      PHYSICAL EXAM  Temp:  [98.1 F (36.7 C)-98.6 F (37 C)] 98.1 F (36.7 C) (02/20 0815) Pulse Rate:  [72-79] 79 (02/20 0815) Resp:  [16-19] 16 (02/20 0815) BP: (117-124)/(51-66) 118/62 (02/20 0815) SpO2:  [97 %-100 %]  100 % (02/20 0815)  General - Well nourished, well developed, in no apparent distress.  Ophthalmologic - fundi not visualized due to noncooperation.  Cardiovascular - Regular rhythm and rate.  Mental Status -  Level of arousal and orientation to time, place, and person were intact. Language including expression, naming, repetition, comprehension was assessed and found intact. Fund of Knowledge was assessed and was intact.  Cranial Nerves II - XII - II - Visual field exam showed right hemianopia with right lower quadrant can see hand waving and simple finger counting. Right upper quadrant not able to see either. III, IV, VI - Extraocular movements intact. V - Facial sensation intact bilaterally. VII - Facial movement intact bilaterally. VIII - Hearing & vestibular intact bilaterally. X - Palate elevates symmetrically. XI - Chin turning & shoulder shrug intact bilaterally. XII - Tongue protrusion intact.  Motor Strength - The patients strength was normal in all extremities and pronator drift was absent.  Bulk was normal and fasciculations were absent.   Motor Tone - Muscle tone was assessed at the neck and appendages and was normal.  Reflexes - The patients reflexes were symmetrical in all extremities and he had no pathological reflexes.  Sensory - Light touch, temperature/pinprick were assessed and were symmetrical.    Coordination - The patient had normal movements in the hands with no ataxia or dysmetria.  Tremor was absent.  Gait and Station - deferred.   ASSESSMENT/PLAN Mr. INIKO ROBLES is a 81 y.o. male with history of HTN, HLD, DM, PE on eliquis, cardiac arrest and CAD s/p stent admitted for elective lung nodule biopsy. No tPA given due to on eliquis.    Stroke:  embolic shower with largest at left PCA infarct, concerning for cardioembolic source CT left occipital hypodensity MRA head and neck unremarkable MRI  left PCA infarcts with b/l watershed punctate  infarcts 2D Echo  EF 55-60% LE venous doppler b/l popliteal, posterior tibial, peroneal and soleal acute DVT TCD bubble study pending to rule out PFO LDL 75.9 HgbA1c 6.2 eliquis for VTE prophylaxis clopidogrel 75 mg daily and Eliquis (apixaban) daily prior to admission, now on heparin IV and Cangrelor IV.  Patient counseled to be compliant with his antithrombotic medications Ongoing aggressive stroke risk factor management  Lung nodule  Pending biopsy on 03/21/21 Now on heparin IV. Given his clot burden and failed eliquis, will recommend coumadin with heparin IV bridge after biopsy and target INR 2.5-3.5.  Plavix now switched to Cangrelor IV  Recent PE complicated by cardiac arrest and PCI On eliquis and cangrelor Given current embolic stroke, pt seems failed eliquis Further regimen may be considered.  LE venous doppler b/l popliteal, posterior tibial, peroneal and soleal acute DVT Given his clot burden and failed eliquis, will recommend coumadin with heparin IV bridge after biopsy and target INR 2.5-3.5.   Diabetes HgbA1c 6.2 goal < 7.0 Controlled Currently on metformin CBG monitoring SSI close PCP follow up  Hypertension Stable Long term BP goal normotensive  Hyperlipidemia Home meds:  crestor 20  LDL 75.9, goal < 70 Now on crestor 40 Continue statin at discharge  Other Stroke Risk Factors Advanced age  Other Active Problems Gout - doing well now  Hospital day # 4    Rosalin Hawking, MD PhD Stroke Neurology 03/19/2021 11:48 AM    To contact Stroke Continuity provider, please refer to [email or web address] After hours, contact General Neurology

## 2021-03-19 NOTE — Care Management Important Message (Signed)
Important Message  Patient Details  Name: Arthur [name] MRN: [id number] Date of Birth: 1940-05-10   Medicare Important Message Given:  Yes     Hannah Beat 03/19/2021, 1:15 PM

## 2021-03-19 NOTE — Progress Notes (Signed)
ANTICOAGULATION CONSULT NOTE - Initial Consult  Pharmacy Consult for Heparin Indication:  history of pulmonary embolus  and DVTs  Allergies  Allergen Reactions   Ace Inhibitors     REACTION: Cough    Patient Measurements: Height: 5\' 11"  (180.3 cm) Weight: 69.5 kg (153 lb 4.8 oz) IBW/kg (Calculated) : 75.3 kg Heparin Dosing Weight: 69.5 kg  Vital Signs: Temp: 99.2 F (37.3 C) (02/20 2049) Temp Source: Oral (02/20 2049) BP: 124/61 (02/20 2049) Pulse Rate: 80 (02/20 2049)  Labs: Recent Labs    03/18/21 0506 03/19/21 0121 03/19/21 1841  HGB 12.2* 11.6*  --   HCT 37.3* 35.4*  --   PLT 294 297  --   APTT  --  39* 176*  HEPARINUNFRC  --  >1.10*  --   CREATININE 1.37* 1.31*  --     Estimated Creatinine Clearance: 44.2 mL/min (A) (by C-G formula based on SCr of 1.31 mg/dL (H)).   Medical History: Past Medical History:  Diagnosis Date   Diabetes mellitus without complication ([place])    High cholesterol    Hypertension    Normal nuclear stress test 2010   stress perfusion study apparently in 2010 in [facility] which he said was negative.    Assessment: 81 yo male presents for planned lung biopsy due to suspicious pulmonary nodules. PTA the patient is on apixaban for history of pulmonary embolus in 01/2021. The patient is planned for biopsy on 2/22, and the plan is for the patient to stop apixaban and transition to IV heparin 2 days prior to procedure. Pharmacy is consulted to dose heparin.   The recent apixaban administration can falsely elevate heparin levels, therefore, will monitor therapy using aPTTs until the aPTT begins to correlate with the heparin level. The patient is also on a Cangrelor infusion, which greatly increases the risk of bleeding. After discussion with team C, the target levels will be reduced to the lower end of therapeutic range to avoid bleeding. Will follow up with aPTT this evening after starting heparin.  APTT 176 is supratherapeutic on 1150  units/hr. Heparin is running in R arm, level was drawn from L arm.  No issues with infusion or bleeding per RN. Will hold heparin x1 hour and restart at lower rate.   Goal of Therapy:  aPTT 66-85 or Heparin level 0.3-0.5 after DOAC washout Monitor platelets by anticoagulation protocol: Yes   Plan:  Hold heparin for 1 hour and decrease to 900 units/hr   F/u aPTT until correlates with heparin level  Monitor daily aPTT, heparin level, CBC Monitor for signs/symptoms of bleeding   Thank you for allowing pharmacy to participate in this patient's care.   Benetta Spar, PharmD, BCPS, BCCP Clinical Pharmacist  Please check AMION for all [facility] phone numbers After 10:00 PM, call [place] [phone]

## 2021-03-19 NOTE — TOC Benefit Eligibility Note (Signed)
Patient Advocate Encounter ° °Insurance verification completed.   ° °The patient is currently admitted and upon discharge could be taking Farxiga 10 mg. ° °The current 30 day co-pay is, $0.00.  ° °The patient is currently admitted and upon discharge could be taking Jardiance 10 mg. ° °The current 30 day co-pay is, $0.00.  ° °The patient is insured through [employer] Medicare Part D  ° ° °Shanai Lartigue, CPhT °Pharmacy Patient Advocate Specialist °[facility] Patient Advocate Team °Direct Number: [phone]  Fax: [phone] ° ° ° ° ° °  °

## 2021-03-19 NOTE — Progress Notes (Signed)
°  Transition of Care [facility]) Screening Note   Patient Details  Name: Arthur [name] Date of Birth: 19-Apr-1940   Transition of Care [facility]) CM/SW Contact:    Ella Bodo, RN Phone Number: 03/19/2021, 10:55 AM    Transition of Care Department [facility]) has reviewed patient and no TOC needs have been identified at this time. We will continue to monitor patient advancement through interdisciplinary progression rounds. If new patient transition needs arise, please place a TOC consult.  Reinaldo Raddle, RN, BSN  Trauma/Neuro ICU Case Manager [phone]

## 2021-03-19 NOTE — Progress Notes (Addendum)
Family Medicine Teaching Service Daily Progress Note Intern Pager: [phone]  Patient name: Arthur [name] Medical record number: [id number] Date of birth: October 30, 1940 Age: 81 y.o. Gender: male  Primary Care Provider: Lind Covert, MD Consultants: Fatima Sanger, neuro Code Status: Full  Pt Overview and Major Events to Date:  2/16- admitted 2/17- clopidogrel held and IV cangrelor started 2/18- Code stroke called for vision loss, MRI idendified L PCA infarct  Assessment and Plan: Arthur [name] is a 81 y.o. male admitted for reversible anticoagulation therapy to have lung biopsy for suspicious pulmonary nodules now with Acute/Subacute L PCA Infarct.  PMH significant for cardiac arrest, CAD s/p PCI, PE on apixaban, gout, HTN, T2DM.  Acute L PCA infarct, presumed cardioembolic source   Acute DVT Hx PE   CAD s/p PCI with DES Preliminary read of LE doppler yesterday showing Bilateral acutre DVT involving R popliteal vein, R posterior tibial veins, R peroneal veins, and R soleal veins as well as L posterior tibial veins, L popliteal vein, L peroneal veins, and L soleal veins. Given that DVT and embolic stroke occurred while pt was on Eliquis, will treat as an Eliquis failure.  Echo obtained yesterday with LVEF 55-60%, severe asymmetric LVH of the basal-septal segment and G2DDF, stable from his past Echo from 02/04/2021. Also with new, small pericardial effusion and negative bubble study.  - Heparin gtt scheduled to start at 1000, will start now given Eliquis failure - Neurology following, appreciate their recs - Will need a new anticoagulation regimen s/p biopsy, as discussed below - Continue Crestor 40mg  daily - Continue atenolol 50mg  BID  Pulmonary Nodules suspicious for malignancy As patient is now two days out from his biopsy, he has been transitioned to heparin gtt off of Eliquis. Also on Cangrelor, off of Plavix.  - Continue with planned biopsy 2/22 - Continue Heparin and Cangrelor  for now  Hypertension BP is well controlled on Losartan 25mg  daily. - Continue losartan  T2DM Well-controlled. CBGs overnight 111-112.  - Continue metformin 1000mg  BID  Gout flare, much improved - Continue colchicine 0.6mg  daily - Continue allopurinol 100mg  daily  FEN/GI: Regular diet PPx: On heparin gtt Dispo:Pending lung biopsy  Subjective:  Arthur [name] reports feeling generally well this morning.  He says that his vision is almost back to baseline.  He is ambulatory and has no acute complaints at this time.  He denies any leg swelling or pain.  Objective: Temp:  [98.1 F (36.7 C)-98.6 F (37 C)] 98.6 F (37 C) (02/20 0528) [name] Rate:  [72-107] 72 (02/20 0528) Resp:  [18-19] 18 (02/20 0528) BP: (111-124)/(51-66) 117/66 (02/20 0528) SpO2:  [97 %-98 %] 97 % (02/20 0528) Physical Exam: General: Awake alert, NAD Cardiovascular: Regular rate, regular rhythm, no murmur Respiratory: Normal work of breathing on room air, lungs clear to auscultation Neuro: Right hemianopia, gait normal, speech fluent Extremities: BLE without edema or deformity  Laboratory: Recent Labs  Lab 03/16/21 0435 03/18/21 0506 03/19/21 0121  WBC 12.6* 14.1* 14.8*  HGB 11.1* 12.2* 11.6*  HCT 35.3* 37.3* 35.4*  PLT 281 294 297   Recent Labs  Lab 03/16/21 0435 03/18/21 0506 03/19/21 0121  NA 135 132* 134*  K 4.4 4.0 4.3  CL 103 102 104  CO2 23 18* 19*  BUN 21 19 20   CREATININE 1.24 1.37* 1.31*  CALCIUM 9.1 8.7* 8.9  PROT 6.6  --   --   BILITOT 0.6  --   --   ALKPHOS 109  --   --  ALT 14  --   --   AST 18  --   --   GLUCOSE 114* 140* 112*     Imaging/Diagnostic Tests: ECHOCARDIOGRAM COMPLETE BUBBLE STUDY    ECHOCARDIOGRAM REPORT       Patient Name:   Arthur [name] [facility] Date of Exam: 03/18/2021 Medical Rec #:  [id number]         Height:       71.0 in Accession #:    [id number]        Weight:       153.3 lb Date of Birth:  06/13/1940          BSA:          1.883 m Patient Age:    43  years          BP:           111/64 mmHg Patient Gender: M                 HR:           78 bpm. Exam Location:  Inpatient  Procedure: 2D Echo, Cardiac Doppler, Color Doppler and Saline Contrast Bubble            Study  Indications:    Stroke   History:        Patient has prior history of Echocardiogram examinations, most                 recent 02/04/2021. Risk Factors:Diabetes, Hypertension and                 Dyslipidemia. Hx DVT and pulmonary embolus.   Sonographer:    Clayton Lefort RDCS (AE) Referring Phys: [place]   1. Left ventricular ejection fraction, by estimation, is 55 to 60%. The left ventricle has normal function. The left ventricle demonstrates regional wall motion abnormalities (see scoring diagram/findings for description). There is severe asymmetric  left ventricular hypertrophy of the basal-septal segment. Left ventricular diastolic parameters are consistent with Grade II diastolic dysfunction (pseudonormalization).  2. Right ventricular systolic function is normal. The right ventricular size is normal. Tricuspid regurgitation signal is inadequate for assessing PA pressure.  3. A small pericardial effusion is present. The pericardial effusion is anterior to the right ventricle.  4. The mitral valve is grossly normal. No evidence of mitral valve regurgitation. No evidence of mitral stenosis.  5. The aortic valve is calcified. Aortic valve regurgitation is not visualized. Aortic valve sclerosis is present, with no evidence of aortic valve stenosis.  6. Agitated saline contrast bubble study was negative, with no evidence of any interatrial shunt.  Comparison(s): Slight improvementment in LV and RV function with persistent WMA.  FINDINGS  Left Ventricle: Left ventricular ejection fraction, by estimation, is 55 to 60%. The left ventricle has normal function. The left ventricle demonstrates regional wall motion abnormalities. The left ventricular  internal cavity size was small. There is  severe asymmetric left ventricular hypertrophy of the basal-septal segment. Left ventricular diastolic parameters are consistent with Grade II diastolic dysfunction (pseudonormalization).    LV Wall Scoring: The inferior wall and basal inferolateral segment are hypokinetic.  Right Ventricle: The right ventricular size is normal. No increase in right ventricular wall thickness. Right ventricular systolic function is normal. Tricuspid regurgitation signal is inadequate for assessing PA pressure.  Left Atrium: Left atrial size was normal in size.  Right Atrium: Right atrial size was normal in size.  Pericardium: A small  pericardial effusion is present. The pericardial effusion is anterior to the right ventricle.  Mitral Valve: The mitral valve is grossly normal. No evidence of mitral valve regurgitation. No evidence of mitral valve stenosis. MV peak gradient, 3.0 mmHg. The mean mitral valve gradient is 2.0 mmHg.  Tricuspid Valve: The tricuspid valve is normal in structure. Tricuspid valve regurgitation is trivial. No evidence of tricuspid stenosis.  Aortic Valve: The aortic valve is calcified. Aortic valve regurgitation is not visualized. Aortic valve sclerosis is present, with no evidence of aortic valve stenosis. Aortic valve mean gradient measures 5.0 mmHg. Aortic valve peak gradient measures 9.6  mmHg. Aortic valve area, by VTI measures 2.60 cm.  Pulmonic Valve: The pulmonic valve was normal in structure. Pulmonic valve regurgitation is not visualized. No evidence of pulmonic stenosis.  Aorta: The aortic root and ascending aorta are structurally normal, with no evidence of dilitation.  IAS/Shunts: No atrial level shunt detected by color flow Doppler. Agitated saline contrast was given intravenously to evaluate for intracardiac shunting. Agitated saline contrast bubble study was negative, with no evidence of any interatrial shunt.    LEFT  VENTRICLE PLAX 2D LVIDd:         3.10 cm   Diastology LVIDs:         2.10 cm   LV e' medial:    4.68 cm/s LV PW:         1.60 cm   LV E/e' medial:  20.3 LV IVS:        1.40 cm   LV e' lateral:   6.42 cm/s LVOT diam:     1.90 cm   LV E/e' lateral: 14.8 LV SV:         75 LV SV Index:   40 LVOT Area:     2.84 cm    RIGHT VENTRICLE             IVC RV Basal diam:  3.90 cm     IVC diam: 1.50 cm RV Mid diam:    3.30 cm RV S prime:     16.00 cm/s TAPSE (M-mode): 2.1 cm  LEFT ATRIUM             Index        RIGHT ATRIUM           Index LA diam:        3.30 cm 1.75 cm/m   RA Area:     17.60 cm LA Vol (A2C):   31.1 ml 16.51 ml/m  RA Volume:   49.30 ml  26.18 ml/m LA Vol (A4C):   27.4 ml 14.55 ml/m LA Biplane Vol: 30.4 ml 16.14 ml/m  AORTIC VALVE AV Area (Vmax):    2.34 cm AV Area (Vmean):   2.48 cm AV Area (VTI):     2.60 cm AV Vmax:           155.00 cm/s AV Vmean:          101.000 cm/s AV VTI:            0.288 m AV Peak Grad:      9.6 mmHg AV Mean Grad:      5.0 mmHg LVOT Vmax:         128.00 cm/s LVOT Vmean:        88.500 cm/s LVOT VTI:          0.264 m LVOT/AV VTI ratio: 0.92   AORTA Ao Root diam: 3.50 cm Ao Asc diam:  3.20 cm  MITRAL VALVE MV Area (PHT): 2.94 cm    SHUNTS MV Area VTI:   2.69 cm    Systemic VTI:  0.26 m MV Peak grad:  3.0 mmHg    Systemic Diam: 1.90 cm MV Mean grad:  2.0 mmHg MV Vmax:       0.87 m/s MV Vmean:      63.8 cm/s MV Decel Time: 258 msec MV E velocity: 95.00 cm/s MV A velocity: 79.50 cm/s MV E/A ratio:  1.19  Rudean Haskell MD Electronically signed by Rudean Haskell MD Signature Date/Time: 03/18/2021/5:24:54 PM      Final   VAS [place] LOWER EXTREMITY VENOUS (DVT)  Lower Venous DVT Study  Patient Name:  Arthur [name] [facility]  Date of Exam:   03/18/2021 Medical Rec #: [id number]          Accession #:    [id number] Date of Birth: 1940/10/26           Patient Gender: M Patient Age:   74 years Exam Location:  [facility] Procedure:      VAS [place] LOWER EXTREMITY VENOUS (DVT) Referring Phys: Cornelius Moras XU  --------------------------------------------------------------------------------   Indications: Stroke, and pulmonary embolism.   Risk Factors: DVT 2017 Probable new lung cancer, awaiting biopsy. Comparison Study: Prior bilateral LEV done 05/19/15 with DVT noted in the right                   PT and Soleal veins  Performing Technologist: Sharion Dove RVS    Examination Guidelines: A complete evaluation includes B-mode imaging, spectral Doppler, color Doppler, and power Doppler as needed of all accessible portions of each vessel. Bilateral testing is considered an integral part of a complete examination. Limited examinations for reoccurring indications may be performed as noted. The reflux portion of the exam is performed with the patient in reverse Trendelenburg.     +---------+---------------+---------+-----------+----------+--------------+  RIGHT     Compressibility Phasicity Spontaneity Properties Thrombus Aging  +---------+---------------+---------+-----------+----------+--------------+  CFV       Full            Yes       Yes                                    +---------+---------------+---------+-----------+----------+--------------+  SFJ       Full                                                             +---------+---------------+---------+-----------+----------+--------------+  FV Prox   Full                                                             +---------+---------------+---------+-----------+----------+--------------+  FV Mid    Full                                                             +---------+---------------+---------+-----------+----------+--------------+  FV Distal Full                                                             +---------+---------------+---------+-----------+----------+--------------+  PFV       Full                                                              +---------+---------------+---------+-----------+----------+--------------+  POP       Partial         No        No                     Acute           +---------+---------------+---------+-----------+----------+--------------+  PTV       None                                             Acute           +---------+---------------+---------+-----------+----------+--------------+  PERO      None                                             Acute           +---------+---------------+---------+-----------+----------+--------------+  Soleal    None                                             Acute           +---------+---------------+---------+-----------+----------+--------------+        +---------+---------------+---------+-----------+----------+--------------+  LEFT      Compressibility Phasicity Spontaneity Properties Thrombus Aging  +---------+---------------+---------+-----------+----------+--------------+  CFV       Full            Yes       Yes                                    +---------+---------------+---------+-----------+----------+--------------+  SFJ       Full                                                             +---------+---------------+---------+-----------+----------+--------------+  FV Prox   Full                                                             +---------+---------------+---------+-----------+----------+--------------+  FV Mid    Full                                                             +---------+---------------+---------+-----------+----------+--------------+  FV Distal Full                                                             +---------+---------------+---------+-----------+----------+--------------+  PFV       Full                                                             +---------+---------------+---------+-----------+----------+--------------+  POP       Partial         No        No                     Acute            +---------+---------------+---------+-----------+----------+--------------+  PTV       None                                             Acute           +---------+---------------+---------+-----------+----------+--------------+  PERO      None                                             Acute           +---------+---------------+---------+-----------+----------+--------------+  Soleal    None                                             Acute           +---------+---------------+---------+-----------+----------+--------------+          Summary: RIGHT: - Findings consistent with acute deep vein thrombosis involving the right popliteal vein, right posterior tibial veins, right peroneal veins, and right soleal veins.   LEFT: - Findings consistent with acute deep vein thrombosis involving the left posterior tibial veins, left popliteal vein, left peroneal veins, and left soleal veins.   *See table(s) above for measurements and observations.       Preliminary      Eppie Gibson, MD 03/19/2021, 6:18 AM PGY-1, [place] Intern pager: [phone], text pages welcome

## 2021-03-19 NOTE — Progress Notes (Signed)
ANTICOAGULATION CONSULT NOTE - Initial Consult  Pharmacy Consult for Heparin Indication:  history of pulmonary embolus  and DVTs  Allergies  Allergen Reactions   Ace Inhibitors     REACTION: Cough    Patient Measurements: Height: 5\' 11"  (180.3 cm) Weight: 69.5 kg (153 lb 4.8 oz) IBW/kg (Calculated) : 75.3 kg Heparin Dosing Weight: 69.5 kg  Vital Signs: Temp: 98.1 F (36.7 C) (02/20 0815) Temp Source: Oral (02/20 0815) BP: 118/62 (02/20 0815) Pulse Rate: 79 (02/20 0815)  Labs: Recent Labs    03/18/21 0506 03/19/21 0121  HGB 12.2* 11.6*  HCT 37.3* 35.4*  PLT 294 297  APTT  --  39*  HEPARINUNFRC  --  >1.10*  CREATININE 1.37* 1.31*     Estimated Creatinine Clearance: 44.2 mL/min (A) (by C-G formula based on SCr of 1.31 mg/dL (H)).   Medical History: Past Medical History:  Diagnosis Date   Diabetes mellitus without complication ([place])    High cholesterol    Hypertension    Normal nuclear stress test 2010   stress perfusion study apparently in 2010 in [facility] which he said was negative.    Assessment: 81 yo male presents for planned lung biopsy due to suspicious pulmonary nodules. PTA the patient is on apixaban for history of pulmonary embolus in 01/2021. The patient is planned for biopsy on 2/22, and the plan is for the patient to stop apixaban and transition to IV heparin 2 days prior to procedure. Pharmacy is consulted to dose heparin.   The recent apixaban administration can falsely elevate heparin levels, therefore, will monitor therapy using aPTTs until the aPTT begins to correlate with the heparin level. The patient is also on a Cangrelor infusion, which greatly increases the risk of bleeding. After discussion with team C, the target levels will be reduced to the lower end of therapeutic range to avoid bleeding. Will follow up with aPTT this evening after starting heparin.  Goal of Therapy:  aPTT 66-85 or Heparin level 0.3-0.5 after DOAC  washout Monitor platelets by anticoagulation protocol: Yes   Plan:  Continue heparin IV at 1150 units/hr  Obtain a 8-hr aPTT after the start of heparin Monitor for signs and symptoms of bleeding Obtain a daily aPTT, heparin level, and CBC  Thank you for allowing pharmacy to participate in this patient's care.  Reatha Harps, PharmD PGY1 Pharmacy Resident 03/19/2021 12:21 PM Check [email or web address] for unit specific pharmacy number

## 2021-03-20 ENCOUNTER — Encounter (HOSPITAL_COMMUNITY): Payer: Self-pay | Admitting: Pulmonary Disease

## 2021-03-20 ENCOUNTER — Encounter (HOSPITAL_COMMUNITY): Payer: Self-pay | Admitting: Family Medicine

## 2021-03-20 DIAGNOSIS — I2510 Atherosclerotic heart disease of native coronary artery without angina pectoris: Secondary | ICD-10-CM

## 2021-03-20 LAB — CBC
HCT: 33.6 % — ABNORMAL LOW (ref 39.0–52.0)
Hemoglobin: 10.6 g/dL — ABNORMAL LOW (ref 13.0–17.0)
MCH: 25.9 pg — ABNORMAL LOW (ref 26.0–34.0)
MCHC: 31.5 g/dL (ref 30.0–36.0)
MCV: 82.2 fL (ref 80.0–100.0)
Platelets: 285 10*3/uL (ref 150–400)
RBC: 4.09 MIL/uL — ABNORMAL LOW (ref 4.22–5.81)
RDW: 13.8 % (ref 11.5–15.5)
WBC: 13.2 10*3/uL — ABNORMAL HIGH (ref 4.0–10.5)
nRBC: 0 % (ref 0.0–0.2)

## 2021-03-20 LAB — BASIC METABOLIC PANEL
Anion gap: 9 (ref 5–15)
BUN: 22 mg/dL (ref 8–23)
CO2: 22 mmol/L (ref 22–32)
Calcium: 8.8 mg/dL — ABNORMAL LOW (ref 8.9–10.3)
Chloride: 103 mmol/L (ref 98–111)
Creatinine, Ser: 1.33 mg/dL — ABNORMAL HIGH (ref 0.61–1.24)
GFR, Estimated: 54 mL/min — ABNORMAL LOW (ref >60)
Glucose, Bld: 109 mg/dL — ABNORMAL HIGH (ref 70–99)
Potassium: 4.1 mmol/L (ref 3.5–5.1)
Sodium: 134 mmol/L — ABNORMAL LOW (ref 135–145)

## 2021-03-20 LAB — HEPARIN LEVEL (UNFRACTIONATED): Heparin Unfractionated: 0.88 IU/mL — ABNORMAL HIGH (ref 0.30–0.70)

## 2021-03-20 LAB — APTT
aPTT: 122 seconds — ABNORMAL HIGH (ref 24–36)
aPTT: 47 seconds — ABNORMAL HIGH (ref 24–36)
aPTT: 65 seconds — ABNORMAL HIGH (ref 24–36)

## 2021-03-20 LAB — GLUCOSE, CAPILLARY
Glucose-Capillary: 122 mg/dL — ABNORMAL HIGH (ref 70–99)
Glucose-Capillary: 141 mg/dL — ABNORMAL HIGH (ref 70–99)
Glucose-Capillary: 105 mg/dL — ABNORMAL HIGH (ref 70–99)

## 2021-03-20 MED ORDER — COLCHICINE 0.6 MG PO TABS
0.6000 mg | ORAL_TABLET | Freq: Every day | ORAL | Status: DC
  Administered 2021-03-21 – 2021-03-22 (×2): 0.6 mg via ORAL
  Filled 2021-03-20 (×3): qty 1

## 2021-03-20 MED ORDER — LOPERAMIDE HCL 2 MG PO CAPS
2.0000 mg | ORAL_CAPSULE | Freq: Once | ORAL | Status: AC
  Administered 2021-03-20: 2 mg via ORAL
  Filled 2021-03-20: qty 1

## 2021-03-20 MED ORDER — ATENOLOL 25 MG PO TABS
25.0000 mg | ORAL_TABLET | Freq: Every day | ORAL | Status: DC
  Administered 2021-03-20 – 2021-03-22 (×3): 25 mg via ORAL
  Filled 2021-03-20 (×5): qty 1

## 2021-03-20 NOTE — Progress Notes (Addendum)
Progress Note  Patient Name: Arthur [name] Date of Encounter: 03/20/2021  [facility] Cardiologist: Buford Dresser, MD    Subjective   No chest pain or SOB, no palpitations Very, very bored  Inpatient Medications    Scheduled Meds:  allopurinol  100 mg Oral Daily   colchicine  0.6 mg Oral BID   empagliflozin  10 mg Oral Daily   losartan  25 mg Oral Daily   metFORMIN  1,000 mg Oral BID WC   multivitamin with minerals  1 tablet Oral Daily   rosuvastatin  40 mg Oral Daily   Continuous Infusions:  cangrelor 50 mg in NS 250 mL 0.75 mcg/kg/min (03/20/21 0150)   heparin 600 Units/hr (03/20/21 0446)   PRN Meds: acetaminophen **OR** acetaminophen, albuterol, polyethylene glycol   Vital Signs    Vitals:   03/19/21 2048 03/19/21 2048 03/19/21 2049 03/20/21 0414  BP: 124/61 124/61 124/61 (!) 121/54  Pulse: 79 81 80 79  Resp: 17   18  Temp: 99.2 F (37.3 C)  99.2 F (37.3 C) 97.9 F (36.6 C)  TempSrc: Oral  Oral   SpO2: 98% 99% 99% 97%  Weight:      Height:        Intake/Output Summary (Last 24 hours) at 03/20/2021 0744 Last data filed at 03/20/2021 0300 Gross per 24 hour  Intake 1585.69 ml  Output --  Net 1585.69 ml   Last 3 Weights 03/15/2021 03/13/2021 03/01/2021  Weight (lbs) 153 lb 4.8 oz (No Data) 156 lb 6.4 oz  Weight (kg) 69.536 kg (No Data) 70.943 kg      Telemetry    SR, SVT vs NSVT - Personally Reviewed  ECG    None today  Physical Exam   General: Well developed, well nourished, male in no acute distress Head: Eyes PERRLA, Head normocephalic and atraumatic Lungs: Clear bilaterally to auscultation. Heart: HRRR S1 S2, without rub or gallop. No murmur. 4/4 extremity pulses are 2+ & equal. No JVD. Abdomen: Bowel sounds are present, abdomen soft and non-tender without masses or  hernias noted. Msk: Normal strength and tone for age. Extremities: No clubbing, cyanosis or edema.    Skin:  No rashes or lesions noted. Neuro: Alert and  oriented X 3. Psych:  Good affect, responds appropriately  Labs    High Sensitivity Troponin:  No results for input(s): TROPONINIHS in the last 720 hours.   Chemistry Recent Labs  Lab 03/16/21 0435 03/18/21 0506 03/19/21 0121 03/20/21 0223  NA 135 132* 134* 134*  K 4.4 4.0 4.3 4.1  CL 103 102 104 103  CO2 23 18* 19* 22  GLUCOSE 114* 140* 112* 109*  BUN 21 19 20 22   CREATININE 1.24 1.37* 1.31* 1.33*  CALCIUM 9.1 8.7* 8.9 8.8*  PROT 6.6  --   --   --   ALBUMIN 2.6*  --   --   --   AST 18  --   --   --   ALT 14  --   --   --   ALKPHOS 109  --   --   --   BILITOT 0.6  --   --   --   GFRNONAA 59* 52* 55* 54*  ANIONGAP 9 12 11 9     Lipids No results for input(s): CHOL, TRIG, HDL, LABVLDL, LDLCALC, CHOLHDL in the last 168 hours.  Hematology Recent Labs  Lab 03/18/21 0506 03/19/21 0121 03/20/21 0223  WBC 14.1* 14.8* 13.2*  RBC 4.60 4.35 4.09*  HGB 12.2* 11.6* 10.6*  HCT 37.3* 35.4* 33.6*  MCV 81.1 81.4 82.2  MCH 26.5 26.7 25.9*  MCHC 32.7 32.8 31.5  RDW 13.6 13.5 13.8  PLT 294 297 285   Thyroid No results for input(s): TSH, FREET4 in the last 168 hours.  BNPNo results for input(s): BNP, PROBNP in the last 168 hours.  DDimer No results for input(s): DDIMER in the last 168 hours.   Radiology    TTE 2/19 1. Left ventricular ejection fraction, by estimation, is 55 to 60%. The  left ventricle has normal function. The left ventricle demonstrates  regional wall motion abnormalities (see scoring diagram/findings for  description). There is severe asymmetric left ventricular hypertrophy of the basal-septal segment. Left ventricular diastolic parameters are consistent with Grade II diastolic dysfunction (pseudonormalization).   2. Right ventricular systolic function is normal. The right ventricular  size is normal. Tricuspid regurgitation signal is inadequate for assessing PA pressure.   3. A small pericardial effusion is present. The pericardial effusion is  anterior to  the right ventricle.   4. The mitral valve is grossly normal. No evidence of mitral valve  regurgitation. No evidence of mitral stenosis.   5. The aortic valve is calcified. Aortic valve regurgitation is not  visualized. Aortic valve sclerosis is present, with no evidence of aortic valve stenosis.   6. Agitated saline contrast bubble study was negative, with no evidence of any interatrial shunt.   Cath 1/23 LM/ostial LAD DES x 1 Ost LAD to Prox LAD lesion is 80% stenosed.   A drug-eluting stent was successfully placed using a STENT ONYX FRONTIER 3.5X15, postdailted to > 4 mm and optimized with IVUS.  Patient Profile     81 y.o. 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 Was on Atenolol pta, not on here. With arrhythmia, may need to restart. Load w/ Plavix after procedure, no ASA since on Eliquis Recurrent DVT/PE: heparin >> Eliquis HTN:  Cont losartan HL:  Cont Crestor. T2DM:  Cont Jardiance, Crestor, losartan.      For questions or updates, please contact [place] Please consult [email or web address] for contact info under   I have personally seen and examined this patient. I agree with the assessment and plan as outlined above.  He is admitted for bridging with IV anti-platelet therapy and IV heparin while awaiting Plavix washout and lung biopsy tomorrow.  No issues over last 24 hours Continue Cangrelor drip and heparin drip.  After biopsy, will need to resume Eliquis and Plavix when safe from bleeding perspective.  We will follow along with you.   Lauree Chandler 03/20/2021 12:18 PM

## 2021-03-20 NOTE — Progress Notes (Signed)
FPTS Brief Progress Note  S: Patient sleeping soundly   O: BP 124/61 (BP Location: Left Arm)    Pulse 80    Temp 99.2 F (37.3 C) (Oral)    Resp 17    Ht 5\' 11"  (1.803 m)    Wt 69.5 kg    SpO2 99%    BMI 21.38 kg/m   General: Patient sleeping soundly Respiratory: Patient breathing comfortably on room air  A/P: Acute L PCA infarct presumed cardioembolic source   acute DVT Pharmacology is consulted and held heparin for 1 hour and restarted at a lower rate earlier this evening due to APTT of 176 being supratherapeutic.  -Cardiology and neurology following -Continue to follow plan outlined in day team's progress note  Pulmonary nodule suspicious for malignancy -Continue with planned biopsy on 2/22  Precious Gilding, DO 03/20/2021, 1:32 AM PGY-1, [place] [facility] Night Resident  Please page [phone] with questions.

## 2021-03-20 NOTE — Anesthesia Preprocedure Evaluation (Addendum)
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 on medications and Pt. on home beta blockers + CAD, + Cardiac Stents and + DVT  Normal cardiovascular exam Rhythm:Regular Rate:Normal  Hx/o cardiac arrest 01/1021 after PTE  Cardiac Cath 02/06/21 LM/ostial LAD DES x 1 Ost LAD to Prox LAD lesion is 80% stenosed.  A drug-eluting stent was successfully placed using a STENT ONYX FRONTIER 3.5X15, postdailted to >4 mm and optimized with IVUS.   Multiple DVT's lower extremity   Neuro/Psych CVA, Residual Symptoms negative psych ROS   GI/Hepatic Neg liver ROS, GERD  Medicated,  Endo/Other  diabetes, Well Controlled, Type 2, Oral Hypoglycemic AgentsHyperlipidemia Gout  Renal/GU negative Renal ROS  negative genitourinary   Musculoskeletal  (+) Arthritis , Osteoarthritis,    Abdominal   Peds  Hematology  (+) Blood dyscrasia, anemia , Eliquis therapy- last dose  Heparin therapy   Anesthesia Other Findings   Reproductive/Obstetrics                            Anesthesia Physical Anesthesia Plan  ASA: 3  Anesthesia Plan: General   Post-op Pain Management: Minimal or no pain anticipated   Induction: Intravenous  PONV Risk Score and Plan: 2 and Treatment may vary due to age or medical condition and Ondansetron  Airway Management Planned: Oral ETT  Additional Equipment: None  Intra-op Plan:    Post-operative Plan: Extubation in OR  Informed Consent: I have reviewed the patients History and Physical, chart, labs and discussed the procedure including the risks, benefits and alternatives for the proposed anesthesia with the patient or authorized representative who has indicated his/her understanding and acceptance.     Dental advisory given  Plan Discussed with: CRNA and Anesthesiologist  Anesthesia Plan Comments:        Anesthesia Quick Evaluation

## 2021-03-20 NOTE — Progress Notes (Signed)
FPTS Brief Progress Note  S: Patient sleeping soundly   O: BP 122/65 (BP Location: Left Arm)    Pulse 72    Temp 98.2 F (36.8 C)    Resp 16    Ht 5\' 11"  (1.803 m)    Wt 69.5 kg    SpO2 96%    BMI 21.38 kg/m   General: Sleeping soundly, NAD Respiratory: Breathing comfortably on room air  A/P: Acute L PCA infarct   acute DVT   pulmonary nodule suspicious for malignancy Due to Eliquis failure, plan to bridge to warfarin after pulmonary biopsy tomorrow -Continue to follow plan as outlined in day team's, progress note  Precious Gilding, DO 03/20/2021, 10:49 PM PGY-1, [facility] Night Resident  Please page [phone] with questions.

## 2021-03-20 NOTE — Progress Notes (Signed)
ANTICOAGULATION CONSULT NOTE - Follow Up Consult  Pharmacy Consult for heparin Indication:  CAD and h/o VTE  Labs: Recent Labs    03/18/21 0506 03/19/21 0121 03/19/21 1841 03/20/21 0223  HGB 12.2* 11.6*  --  10.6*  HCT 37.3* 35.4*  --  33.6*  PLT 294 297  --  285  APTT  --  39* 176* 122*  HEPARINUNFRC  --  >1.10*  --  0.88*  CREATININE 1.37* 1.31*  --  1.33*    Assessment: 81yo male remains supratherapeutic on heparin after rate change; no infusion issues or signs of bleeding per RN.  Goal of Therapy:  aPTT 66-85 seconds   Plan:  Will decrease heparin infusion by 4 units/kg/hr to 600 units/hr and check PTT in 8 hours.    Wynona Neat, PharmD, BCPS  03/20/2021,4:45 AM

## 2021-03-20 NOTE — Progress Notes (Addendum)
STROKE TEAM PROGRESS NOTE   SUBJECTIVE (INTERVAL HISTORY) Wife and daughter are at the bedside. Pt sitting in chair, doing well, no complains, pending biopsy tomorrow. No acute event overnight.    OBJECTIVE Temp:  [97.4 F (36.3 C)-99.2 F (37.3 C)] 97.4 F (36.3 C) (02/21 0751) Pulse Rate:  [73-81] 81 (02/21 0751) Cardiac Rhythm: Other (Comment) (02/21 0958) Resp:  [17-19] 19 (02/21 0751) BP: (120-135)/(54-72) 120/64 (02/21 0751) SpO2:  [97 %-100 %] 100 % (02/21 0751)  Recent Labs  Lab 03/19/21 0835 03/19/21 1157 03/19/21 1654 03/20/21 0754 03/20/21 1128  GLUCAP 119* 166* 92 122* 141*   Recent Labs  Lab 03/16/21 0435 03/18/21 0506 03/19/21 0121 03/20/21 0223  NA 135 132* 134* 134*  K 4.4 4.0 4.3 4.1  CL 103 102 104 103  CO2 23 18* 19* 22  GLUCOSE 114* 140* 112* 109*  BUN 21 19 20 22   CREATININE 1.24 1.37* 1.31* 1.33*  CALCIUM 9.1 8.7* 8.9 8.8*   Recent Labs  Lab 03/16/21 0435  AST 18  ALT 14  ALKPHOS 109  BILITOT 0.6  PROT 6.6  ALBUMIN 2.6*   Recent Labs  Lab 03/16/21 0435 03/18/21 0506 03/19/21 0121 03/20/21 0223  WBC 12.6* 14.1* 14.8* 13.2*  HGB 11.1* 12.2* 11.6* 10.6*  HCT 35.3* 37.3* 35.4* 33.6*  MCV 82.5 81.1 81.4 82.2  PLT 281 294 297 285   No results for input(s): CKTOTAL, CKMB, CKMBINDEX, TROPONINI in the last 168 hours. No results for input(s): LABPROT, INR in the last 72 hours. No results for input(s): COLORURINE, LABSPEC, [place], GLUCOSEU, HGBUR, BILIRUBINUR, KETONESUR, PROTEINUR, UROBILINOGEN, NITRITE, LEUKOCYTESUR in the last 72 hours.  Invalid input(s): APPERANCEUR     Component Value Date/Time   CHOL 133 02/06/2021 0202   CHOL 157 09/08/2019 1011   TRIG 97 02/06/2021 0202   HDL 43 02/06/2021 0202   HDL 41 09/08/2019 1011   CHOLHDL 3.1 02/06/2021 0202   VLDL 19 02/06/2021 0202   LDLCALC 71 02/06/2021 0202   LDLCALC 82 09/08/2019 1011   Lab Results  Component Value Date   HGBA1C 6.2 (H) 03/17/2021   No results found  for: LABOPIA, COCAINSCRNUR, LABBENZ, AMPHETMU, THCU, LABBARB  No results for input(s): ETH in the last 168 hours.  I have personally reviewed the radiological images below and agree with the radiology interpretations.  MR ANGIO HEAD WO CONTRAST  Result Date: 03/17/2021 CLINICAL DATA:  Transient ischemic attack (TIA). Stroke, follow up. Neuro deficit, acute, stroke. suspected R hemianopsia. EXAM: MRI HEAD WITHOUT AND WITH CONTRAST MRA HEAD WITHOUT CONTRAST MRA NECK WITHOUT AND WITH CONTRAST TECHNIQUE: Multiplanar, multiecho pulse sequences of the brain and surrounding structures were obtained without and with intravenous contrast. Angiographic images of the Circle of Willis were obtained using MRA technique without intravenous contrast. Angiographic images of the neck were obtained using MRA technique without and with intravenous contrast. Carotid stenosis measurements (when applicable) are obtained utilizing NASCET criteria, using the distal internal carotid diameter as the denominator. CONTRAST:  23mL GADAVIST GADOBUTROL 1 MMOL/ML IV SOLN COMPARISON:  None. FINDINGS: MRI HEAD FINDINGS Brain: Acute infarcts within the left greater than right occipital lobes, confluent in left. Additional punctate infarct in the right temporal lobe and multiple punctate infarcts in bilateral frontal white matter. Single punctate infarct in the right cerebellum. These infarcts are associated with edema, greatest in the left occipital lobe. No significant mass effect. No evidence of acute hemorrhage mass lesion, midline shift, hydrocephalus, or extra-axial fluid collection. Cerebral atrophy. A small  infarct in the right occipital lobe demonstrates mild enhancement. Also, linear enhancement in the left cerebellum in a region of mild edema, probably subacute infarct. Otherwise, no abnormal enhancement on motion limited evaluation. Vascular: Detailed below. Skull and upper cervical spine: Normal marrow signal. Degenerative pannus at  the craniocervical junction. Sinuses/Orbits: Mild paranasal sinus mucosal thickening with inferior maxillary sinus retention cyst. MRA HEAD FINDINGS Anterior Circulation: Bilateral intracranial ICAs, MCAs, and ACAs are patent without proximal hemodynamically significant stenosis. No aneurysm identified. Posterior circulation the visualized intradural vertebral arteries basilar artery, and posterior cerebral arteries are patent without proximal hemodynamically significant stenosis. No aneurysm identified. MRA NECK FINDINGS Motion limited. Aorta: Great vessel origins are incompletely imaged. Carotid system: Bilateral common carotid internal carotid arteries are patent. Atherosclerosis at the left carotid bifurcation without greater than 50% stenosis relative to the distal vessel. Vertebral arteries: Left dominant. No evidence of significant (greater than 50%) stenosis IMPRESSION: MRI: 1. Acute, confluent infarct in the left occipital lobe (left PCA territory). Multiple additional small infarcts in the right occipital lobe, bilateral frontal white matter, right temporal lobe, and right cerebellum, as detailed above. Given involvement of multiple vascular territories, consider embolic etiology. 2. Mild cortical right occipital and left cerebellar enhancement, probably related to subacute infarcts given the above findings and appearence. MRA head: No large vessel occlusion or proximal hemodynamically significant stenosis. MRA neck: Motion limited without evidence of significant (greater than 50%) stenosis Electronically Signed   By: Margaretha Sheffield M.D.   On: 03/17/2021 18:38   MR ANGIO NECK W WO CONTRAST  Result Date: 03/17/2021 CLINICAL DATA:  Transient ischemic attack (TIA). Stroke, follow up. Neuro deficit, acute, stroke. suspected R hemianopsia. EXAM: MRI HEAD WITHOUT AND WITH CONTRAST MRA HEAD WITHOUT CONTRAST MRA NECK WITHOUT AND WITH CONTRAST TECHNIQUE: Multiplanar, multiecho pulse sequences of the brain  and surrounding structures were obtained without and with intravenous contrast. Angiographic images of the Circle of Willis were obtained using MRA technique without intravenous contrast. Angiographic images of the neck were obtained using MRA technique without and with intravenous contrast. Carotid stenosis measurements (when applicable) are obtained utilizing NASCET criteria, using the distal internal carotid diameter as the denominator. CONTRAST:  26mL GADAVIST GADOBUTROL 1 MMOL/ML IV SOLN COMPARISON:  None. FINDINGS: MRI HEAD FINDINGS Brain: Acute infarcts within the left greater than right occipital lobes, confluent in left. Additional punctate infarct in the right temporal lobe and multiple punctate infarcts in bilateral frontal white matter. Single punctate infarct in the right cerebellum. These infarcts are associated with edema, greatest in the left occipital lobe. No significant mass effect. No evidence of acute hemorrhage mass lesion, midline shift, hydrocephalus, or extra-axial fluid collection. Cerebral atrophy. A small infarct in the right occipital lobe demonstrates mild enhancement. Also, linear enhancement in the left cerebellum in a region of mild edema, probably subacute infarct. Otherwise, no abnormal enhancement on motion limited evaluation. Vascular: Detailed below. Skull and upper cervical spine: Normal marrow signal. Degenerative pannus at the craniocervical junction. Sinuses/Orbits: Mild paranasal sinus mucosal thickening with inferior maxillary sinus retention cyst. MRA HEAD FINDINGS Anterior Circulation: Bilateral intracranial ICAs, MCAs, and ACAs are patent without proximal hemodynamically significant stenosis. No aneurysm identified. Posterior circulation the visualized intradural vertebral arteries basilar artery, and posterior cerebral arteries are patent without proximal hemodynamically significant stenosis. No aneurysm identified. MRA NECK FINDINGS Motion limited. Aorta: Great vessel  origins are incompletely imaged. Carotid system: Bilateral common carotid internal carotid arteries are patent. Atherosclerosis at the left carotid bifurcation without greater than 50%  stenosis relative to the distal vessel. Vertebral arteries: Left dominant. No evidence of significant (greater than 50%) stenosis IMPRESSION: MRI: 1. Acute, confluent infarct in the left occipital lobe (left PCA territory). Multiple additional small infarcts in the right occipital lobe, bilateral frontal white matter, right temporal lobe, and right cerebellum, as detailed above. Given involvement of multiple vascular territories, consider embolic etiology. 2. Mild cortical right occipital and left cerebellar enhancement, probably related to subacute infarcts given the above findings and appearence. MRA head: No large vessel occlusion or proximal hemodynamically significant stenosis. MRA neck: Motion limited without evidence of significant (greater than 50%) stenosis Electronically Signed   By: Margaretha Sheffield M.D.   On: 03/17/2021 18:38   MR BRAIN W WO CONTRAST  Result Date: 03/17/2021 CLINICAL DATA:  Transient ischemic attack (TIA). Stroke, follow up. Neuro deficit, acute, stroke. suspected R hemianopsia. EXAM: MRI HEAD WITHOUT AND WITH CONTRAST MRA HEAD WITHOUT CONTRAST MRA NECK WITHOUT AND WITH CONTRAST TECHNIQUE: Multiplanar, multiecho pulse sequences of the brain and surrounding structures were obtained without and with intravenous contrast. Angiographic images of the Circle of Willis were obtained using MRA technique without intravenous contrast. Angiographic images of the neck were obtained using MRA technique without and with intravenous contrast. Carotid stenosis measurements (when applicable) are obtained utilizing NASCET criteria, using the distal internal carotid diameter as the denominator. CONTRAST:  35mL GADAVIST GADOBUTROL 1 MMOL/ML IV SOLN COMPARISON:  None. FINDINGS: MRI HEAD FINDINGS Brain: Acute infarcts  within the left greater than right occipital lobes, confluent in left. Additional punctate infarct in the right temporal lobe and multiple punctate infarcts in bilateral frontal white matter. Single punctate infarct in the right cerebellum. These infarcts are associated with edema, greatest in the left occipital lobe. No significant mass effect. No evidence of acute hemorrhage mass lesion, midline shift, hydrocephalus, or extra-axial fluid collection. Cerebral atrophy. A small infarct in the right occipital lobe demonstrates mild enhancement. Also, linear enhancement in the left cerebellum in a region of mild edema, probably subacute infarct. Otherwise, no abnormal enhancement on motion limited evaluation. Vascular: Detailed below. Skull and upper cervical spine: Normal marrow signal. Degenerative pannus at the craniocervical junction. Sinuses/Orbits: Mild paranasal sinus mucosal thickening with inferior maxillary sinus retention cyst. MRA HEAD FINDINGS Anterior Circulation: Bilateral intracranial ICAs, MCAs, and ACAs are patent without proximal hemodynamically significant stenosis. No aneurysm identified. Posterior circulation the visualized intradural vertebral arteries basilar artery, and posterior cerebral arteries are patent without proximal hemodynamically significant stenosis. No aneurysm identified. MRA NECK FINDINGS Motion limited. Aorta: Great vessel origins are incompletely imaged. Carotid system: Bilateral common carotid internal carotid arteries are patent. Atherosclerosis at the left carotid bifurcation without greater than 50% stenosis relative to the distal vessel. Vertebral arteries: Left dominant. No evidence of significant (greater than 50%) stenosis IMPRESSION: MRI: 1. Acute, confluent infarct in the left occipital lobe (left PCA territory). Multiple additional small infarcts in the right occipital lobe, bilateral frontal white matter, right temporal lobe, and right cerebellum, as detailed above.  Given involvement of multiple vascular territories, consider embolic etiology. 2. Mild cortical right occipital and left cerebellar enhancement, probably related to subacute infarcts given the above findings and appearence. MRA head: No large vessel occlusion or proximal hemodynamically significant stenosis. MRA neck: Motion limited without evidence of significant (greater than 50%) stenosis Electronically Signed   By: Margaretha Sheffield M.D.   On: 03/17/2021 18:38   NM PET Image Initial (PI) Skull Base To Thigh  Result Date: 02/22/2021 CLINICAL DATA:  Initial treatment  strategy for pulmonary nodules. EXAM: NUCLEAR MEDICINE PET SKULL BASE TO THIGH TECHNIQUE: 7.7 mCi F-18 FDG was injected intravenously. Full-ring PET imaging was performed from the skull base to thigh after the radiotracer. CT data was obtained and used for attenuation correction and anatomic localization. Fasting blood glucose: 131 mg/dl COMPARISON:  CT chest 02/03/2021 FINDINGS: Mediastinal blood pool activity: SUV max 7.7 Liver activity: SUV max 131 NECK: No significant abnormal hypermetabolic activity in this region. Incidental CT findings: Chronic bilateral maxillary sinusitis. Left common carotid atherosclerotic calcification. CHEST: Hypermetabolic pulmonary nodules are present. Index right lower lobe nodule 1.9 by 1.5 cm on image 38 series 8, maximum SUV 9.6. A right upper lobe pulmonary nodule measuring 1.2 by 1.0 cm on image 23 series 8 has a maximum SUV of 4.8. A 0.9 by 0.7 cm left lower lobe nodule near the diaphragm on image 57 of series 8 has a maximum SUV of 2.0. Other smaller nodules are present. Hypermetabolic bilateral supraclavicular, right paratracheal, AP window, paraesophageal, subcarinal, right hilar, left hilar, and periaortic adenopathy is observed. Index right paratracheal node 1.2 cm in short axis on image 65 series 4, maximum SUV 7.3. Index conglomerate AP window adenopathy 1.8 cm in short axis on image 69 series 4,  maximum SUV 15.7. Incidental CT findings: Suspected underlying emphysema. Coronary, aortic arch, and branch vessel atherosclerotic vascular disease. The right apical pleural thickening shown on prior CT is no longer well seen. ABDOMEN/PELVIS: Hypermetabolic right gastric, porta hepatis, peripancreatic, portacaval, retroperitoneal, mesenteric, and right common iliac adenopathy. Index left periaortic node 1.1 cm in short axis on image 136 series 4, maximum SUV 19.2. Index portacaval node 1.1 cm in short axis on image 114 series 4, maximum SUV 17.5. Index right common iliac node 1.0 cm in short axis on image 151 series 4, maximum SUV 16.9. Scattered accentuated activity in bowel, likely physiologic. Incidental CT findings: Dilated dorsal pancreatic duct with a 1.3 by 0.9 cm calcification along the dorsal pancreatic duct in the pancreatic head. Photopenic right renal cysts. Nonobstructive right nephrolithiasis. Atherosclerosis is present, including aortoiliac atherosclerotic disease. Indirect right inguinal hernia containing loops of distal ileum extending down into the scrotum. SKELETON: A 5 mm sclerotic lesion posteriorly in the T11 vertebral body on image 103 of series 4 has mildly accentuated metabolic activity with maximum SUV of 3.4, concerning for oligometastasic osseous involvement. Accentuated activity at the site of what appears to be a healing fracture of the right anterior second rib, maximum SUV 3.1, probably benign. Similar findings in the right anterior third rib (maximum SUV 2.0) and right anterior fourth rib (maximum SUV 4.0 and right anterior fifth rib (maximum SUV 3.6) multiple old healed left-sided rib fractures are present. Incidental CT findings: none IMPRESSION: 1. The three largest pulmonary nodules including the 1.9 by 1.5 cm right lower lobe nodule are hypermetabolic, and there is substantial hypermetabolic adenopathy in the chest and upper abdomen. Likewise there is a small focus of  hypermetabolic activity corresponding to sclerotic density in the T11 vertebral body. Appearance compatible with active malignancy, primary suspicion is for metastatic lung cancer. 2. Other imaging findings of potential clinical significance: Aortic Atherosclerosis (ICD10-I70.0) and Emphysema (ICD10-J43.9). Coronary atherosclerosis. Nonobstructive right nephrolithiasis. Dilated dorsal pancreatic duct with a large stone or calcification along the pancreatic head. Indirect right inguinal hernia contains loops of terminal ileum extending down towards the scrotum. Healing right anterior rib fractures with old healed left rib fractures. Electronically Signed   By: Van Clines M.D.   On: 02/22/2021  08:22   VAS [place] TRANSCRANIAL DOPPLER W BUBBLES  Result Date: 03/19/2021  Transcranial Doppler with Bubble Patient Name:  Arthur [name] [facility]  Date of Exam:   03/19/2021 Medical Rec #: [id number]          Accession #:    [id number] Date of Birth: [birth year]           Patient Gender: M Patient Age:   2 years Exam Location:  [facility] Procedure:      VAS [place] TRANSCRANIAL DOPPLER W BUBBLES Referring Phys: Cornelius Moras Bronislaus Verdell --------------------------------------------------------------------------------  Indications: Stroke. History: History of DVT, positive lower extremity venous study on 03-18-2021. Comparison Study: 03-18-2021 Echo w/ bubble study was negative for evidence of                   atrial level shunt. Performing Technologist: Darlin Coco RDMS, RVT  Examination Guidelines: A complete evaluation includes B-mode imaging, spectral Doppler, color Doppler, and power Doppler as needed of all accessible portions of each vessel. Bilateral testing is considered an integral part of a complete examination. Limited examinations for reoccurring indications may be performed as noted.  Summary:  A vascular evaluation was performed. The left middle cerebral artery was studied. An IV was inserted into the patient's right  forearm. Verbal informed consent was obtained.  Less than 10 latent high intensity transient signals (HITS) were observed at rest and with valsalva maneuver, indicating a possible intrapulmonary shunt. Today's examination was limited due to suboptimal valsalva maneuver. *See table(s) above for TCD measurements and observations.    Preliminary    ECHOCARDIOGRAM COMPLETE BUBBLE STUDY  Result Date: 03/18/2021    ECHOCARDIOGRAM REPORT   Patient Name:   Arthur [name] [facility] Date of Exam: 03/18/2021 Medical Rec #:  [id number]         Height:       71.0 in Accession #:    [id number]        Weight:       153.3 lb Date of Birth:  [birth year]          BSA:          1.883 m Patient Age:    43 years          BP:           111/64 mmHg Patient Gender: M                 HR:           78 bpm. Exam Location:  Inpatient Procedure: 2D Echo, Cardiac Doppler, Color Doppler and Saline Contrast Bubble            Study Indications:    Stroke  History:        Patient has prior history of Echocardiogram examinations, most                 recent 02/04/2021. Risk Factors:Diabetes, Hypertension and                 Dyslipidemia. Hx DVT and pulmonary embolus.  Sonographer:    Clayton Lefort RDCS (AE) Referring Phys: [place]  1. Left ventricular ejection fraction, by estimation, is 55 to 60%. The left ventricle has normal function. The left ventricle demonstrates regional wall motion abnormalities (see scoring diagram/findings for description). There is severe asymmetric left ventricular hypertrophy of the basal-septal segment. Left ventricular diastolic parameters are consistent with Grade II diastolic dysfunction (pseudonormalization).  2. Right ventricular systolic function is normal.  The right ventricular size is normal. Tricuspid regurgitation signal is inadequate for assessing PA pressure.  3. A small pericardial effusion is present. The pericardial effusion is anterior to the right ventricle.  4. The mitral valve is  grossly normal. No evidence of mitral valve regurgitation. No evidence of mitral stenosis.  5. The aortic valve is calcified. Aortic valve regurgitation is not visualized. Aortic valve sclerosis is present, with no evidence of aortic valve stenosis.  6. Agitated saline contrast bubble study was negative, with no evidence of any interatrial shunt. Comparison(s): Slight improvementment in LV and RV function with persistent WMA. FINDINGS  Left Ventricle: Left ventricular ejection fraction, by estimation, is 55 to 60%. The left ventricle has normal function. The left ventricle demonstrates regional wall motion abnormalities. The left ventricular internal cavity size was small. There is severe asymmetric left ventricular hypertrophy of the basal-septal segment. Left ventricular diastolic parameters are consistent with Grade II diastolic dysfunction (pseudonormalization).  LV Wall Scoring: The inferior wall and basal inferolateral segment are hypokinetic. Right Ventricle: The right ventricular size is normal. No increase in right ventricular wall thickness. Right ventricular systolic function is normal. Tricuspid regurgitation signal is inadequate for assessing PA pressure. Left Atrium: Left atrial size was normal in size. Right Atrium: Right atrial size was normal in size. Pericardium: A small pericardial effusion is present. The pericardial effusion is anterior to the right ventricle. Mitral Valve: The mitral valve is grossly normal. No evidence of mitral valve regurgitation. No evidence of mitral valve stenosis. MV peak gradient, 3.0 mmHg. The mean mitral valve gradient is 2.0 mmHg. Tricuspid Valve: The tricuspid valve is normal in structure. Tricuspid valve regurgitation is trivial. No evidence of tricuspid stenosis. Aortic Valve: The aortic valve is calcified. Aortic valve regurgitation is not visualized. Aortic valve sclerosis is present, with no evidence of aortic valve stenosis. Aortic valve mean gradient measures  5.0 mmHg. Aortic valve peak gradient measures 9.6  mmHg. Aortic valve area, by VTI measures 2.60 cm. Pulmonic Valve: The pulmonic valve was normal in structure. Pulmonic valve regurgitation is not visualized. No evidence of pulmonic stenosis. Aorta: The aortic root and ascending aorta are structurally normal, with no evidence of dilitation. IAS/Shunts: No atrial level shunt detected by color flow Doppler. Agitated saline contrast was given intravenously to evaluate for intracardiac shunting. Agitated saline contrast bubble study was negative, with no evidence of any interatrial shunt.  LEFT VENTRICLE PLAX 2D LVIDd:         3.10 cm   Diastology LVIDs:         2.10 cm   LV e' medial:    4.68 cm/s LV PW:         1.60 cm   LV E/e' medial:  20.3 LV IVS:        1.40 cm   LV e' lateral:   6.42 cm/s LVOT diam:     1.90 cm   LV E/e' lateral: 14.8 LV SV:         75 LV SV Index:   40 LVOT Area:     2.84 cm  RIGHT VENTRICLE             IVC RV Basal diam:  3.90 cm     IVC diam: 1.50 cm RV Mid diam:    3.30 cm RV S prime:     16.00 cm/s TAPSE (M-mode): 2.1 cm LEFT ATRIUM             Index  RIGHT ATRIUM           Index LA diam:        3.30 cm 1.75 cm/m   RA Area:     17.60 cm LA Vol (A2C):   31.1 ml 16.51 ml/m  RA Volume:   49.30 ml  26.18 ml/m LA Vol (A4C):   27.4 ml 14.55 ml/m LA Biplane Vol: 30.4 ml 16.14 ml/m  AORTIC VALVE AV Area (Vmax):    2.34 cm AV Area (Vmean):   2.48 cm AV Area (VTI):     2.60 cm AV Vmax:           155.00 cm/s AV Vmean:          101.000 cm/s AV VTI:            0.288 m AV Peak Grad:      9.6 mmHg AV Mean Grad:      5.0 mmHg LVOT Vmax:         128.00 cm/s LVOT Vmean:        88.500 cm/s LVOT VTI:          0.264 m LVOT/AV VTI ratio: 0.92  AORTA Ao Root diam: 3.50 cm Ao Asc diam:  3.20 cm MITRAL VALVE MV Area (PHT): 2.94 cm    SHUNTS MV Area VTI:   2.69 cm    Systemic VTI:  0.26 m MV Peak grad:  3.0 mmHg    Systemic Diam: 1.90 cm MV Mean grad:  2.0 mmHg MV Vmax:       0.87 m/s MV Vmean:       63.8 cm/s MV Decel Time: 258 msec MV E velocity: 95.00 cm/s MV A velocity: 79.50 cm/s MV E/A ratio:  1.19 Rudean Haskell MD Electronically signed by Rudean Haskell MD Signature Date/Time: 03/18/2021/5:24:54 PM    Final    CT HEAD CODE STROKE WO CONTRAST`  Result Date: 03/17/2021 CLINICAL DATA:  Code stroke. 81 year old male with right eye loss of vision upon waking. EXAM: CT HEAD WITHOUT CONTRAST TECHNIQUE: Contiguous axial images were obtained from the base of the skull through the vertex without intravenous contrast. RADIATION DOSE REDUCTION: This exam was performed according to the departmental dose-optimization program which includes automated exposure control, adjustment of the mA and/or kV according to patient size and/or use of iterative reconstruction technique. COMPARISON:  Head CT 02/04/2021. FINDINGS: Brain: Patchy cytotoxic edema in the left occipital pole on series 3 image 15. No hemorrhagic transformation or mass effect. Elsewhere Stable gray-white matter differentiation throughout the brain. No midline shift, ventriculomegaly, mass effect, evidence of mass lesion, intracranial hemorrhage or other cortically based acute infarction. Vascular: Extensive Calcified atherosclerosis at the skull base. No suspicious intracranial vascular hyperdensity. Skull: No acute osseous abnormality identified. Sinuses/Orbits: Visualized paranasal sinuses and mastoids are stable and well aerated. Other: Orbits soft tissues appears stable and negative. Visualized scalp soft tissues are within normal limits. ASPECTS [facility] Stroke Program Early CT Score) Total score (0-10 with 10 being normal): 10 (left PCA territory) IMPRESSION: 1. Cytotoxic edema in the left occipital pole compatible with acute to subacute Left PCA territory infarct. No hemorrhagic transformation or mass effect. 2. Otherwise stable since 02/04/2021. 3. These results were communicated to Dr. Quinn Axe at 11:14 am on 03/17/2021 by text page via  the [facility] messaging system. Electronically Signed   By: Genevie Ann M.D.   On: 03/17/2021 11:19   VAS [place] LOWER EXTREMITY VENOUS (DVT)  Result Date: 03/19/2021  Lower Venous DVT Study Patient Name:  Arthur [name] [facility]  Date of Exam:   03/18/2021 Medical Rec #: [id number]          Accession #:    [id number] Date of Birth: Feb 16, 1940           Patient Gender: M Patient Age:   23 years Exam Location:  [facility] Procedure:      VAS [place] LOWER EXTREMITY VENOUS (DVT) Referring Phys: Cornelius Moras Deema Juncaj --------------------------------------------------------------------------------  Indications: Stroke, and pulmonary embolism.  Risk Factors: DVT 2017 Probable new lung cancer, awaiting biopsy. Comparison Study: Prior bilateral LEV done 05/19/15 with DVT noted in the right                   PT and Soleal veins Performing Technologist: Sharion Dove RVS  Examination Guidelines: A complete evaluation includes B-mode imaging, spectral Doppler, color Doppler, and power Doppler as needed of all accessible portions of each vessel. Bilateral testing is considered an integral part of a complete examination. Limited examinations for reoccurring indications may be performed as noted. The reflux portion of the exam is performed with the patient in reverse Trendelenburg.  +---------+---------------+---------+-----------+----------+--------------+  RIGHT     Compressibility Phasicity Spontaneity Properties Thrombus Aging  +---------+---------------+---------+-----------+----------+--------------+  CFV       Full            Yes       Yes                                    +---------+---------------+---------+-----------+----------+--------------+  SFJ       Full                                                             +---------+---------------+---------+-----------+----------+--------------+  FV Prox   Full                                                             +---------+---------------+---------+-----------+----------+--------------+   FV Mid    Full                                                             +---------+---------------+---------+-----------+----------+--------------+  FV Distal Full                                                             +---------+---------------+---------+-----------+----------+--------------+  PFV       Full                                                             +---------+---------------+---------+-----------+----------+--------------+  POP       Partial         No        No                     Acute           +---------+---------------+---------+-----------+----------+--------------+  PTV       None                                             Acute           +---------+---------------+---------+-----------+----------+--------------+  PERO      None                                             Acute           +---------+---------------+---------+-----------+----------+--------------+  Soleal    None                                             Acute           +---------+---------------+---------+-----------+----------+--------------+   +---------+---------------+---------+-----------+----------+--------------+  LEFT      Compressibility Phasicity Spontaneity Properties Thrombus Aging  +---------+---------------+---------+-----------+----------+--------------+  CFV       Full            Yes       Yes                                    +---------+---------------+---------+-----------+----------+--------------+  SFJ       Full                                                             +---------+---------------+---------+-----------+----------+--------------+  FV Prox   Full                                                             +---------+---------------+---------+-----------+----------+--------------+  FV Mid    Full                                                             +---------+---------------+---------+-----------+----------+--------------+  FV Distal Full                                                              +---------+---------------+---------+-----------+----------+--------------+  PFV       Full                                                             +---------+---------------+---------+-----------+----------+--------------+  POP       Partial         No        No                     Acute           +---------+---------------+---------+-----------+----------+--------------+  PTV       None                                             Acute           +---------+---------------+---------+-----------+----------+--------------+  PERO      None                                             Acute           +---------+---------------+---------+-----------+----------+--------------+  Soleal    None                                             Acute           +---------+---------------+---------+-----------+----------+--------------+     Summary: RIGHT: - Findings consistent with acute deep vein thrombosis involving the right popliteal vein, right posterior tibial veins, right peroneal veins, and right soleal veins.  LEFT: - Findings consistent with acute deep vein thrombosis involving the left posterior tibial veins, left popliteal vein, left peroneal veins, and left soleal veins.  *See table(s) above for measurements and observations. Electronically signed by Monica Martinez MD on 03/19/2021 at 2:32:43 PM.    Final      PHYSICAL EXAM  Temp:  [97.4 F (36.3 C)-99.2 F (37.3 C)] 97.4 F (36.3 C) (02/21 0751) Pulse Rate:  [73-81] 81 (02/21 0751) Resp:  [17-19] 19 (02/21 0751) BP: (120-135)/(54-72) 120/64 (02/21 0751) SpO2:  [97 %-100 %] 100 % (02/21 0751)  General - Well nourished, well developed, in no apparent distress.  Ophthalmologic - fundi not visualized due to noncooperation.  Cardiovascular - Regular rhythm and rate.  Mental Status -  Level of arousal and orientation to time, place, and person were intact. Language including expression, naming, repetition,  comprehension was assessed and found intact. Fund of Knowledge was assessed and was intact.  Cranial Nerves II - XII - II - Visual field exam showed right hemianopia with right lower quadrant can see hand waving and simple finger counting. Right upper quadrant not able to see either. III, IV, VI - Extraocular movements intact. V - Facial sensation intact bilaterally. VII - Facial movement intact bilaterally. VIII - Hearing & vestibular intact bilaterally. X - Palate elevates symmetrically. XI - Chin turning & shoulder shrug intact bilaterally. XII - Tongue protrusion intact.  Motor Strength - The patients strength was normal  in all extremities and pronator drift was absent.  Bulk was normal and fasciculations were absent.   Motor Tone - Muscle tone was assessed at the neck and appendages and was normal.  Reflexes - The patients reflexes were symmetrical in all extremities and he had no pathological reflexes.  Sensory - Light touch, temperature/pinprick were assessed and were symmetrical.    Coordination - The patient had normal movements in the hands with no ataxia or dysmetria.  Tremor was absent.  Gait and Station - deferred.   ASSESSMENT/PLAN Arthur [name] is a 81 y.o. male with history of HTN, HLD, DM, PE on eliquis, cardiac arrest and CAD s/p stent admitted for elective lung nodule biopsy. No tPA given due to on eliquis.    Stroke:  embolic shower with largest at left PCA infarct, concerning for cardioembolic source CT left occipital hypodensity MRA head and neck unremarkable MRI  left PCA infarcts with b/l watershed punctate infarcts 2D Echo  EF 55-60% LE venous doppler b/l popliteal, posterior tibial, peroneal and soleal acute DVT TCD bubble study no PFO LDL 75.9 HgbA1c 6.2 eliquis for VTE prophylaxis clopidogrel 75 mg daily and Eliquis (apixaban) daily prior to admission, now on heparin IV and Cangrelor IV.  Patient counseled to be compliant with his  antithrombotic medications Ongoing aggressive stroke risk factor management  Lung nodule  Pending biopsy on 03/21/21 Now on heparin IV. Given his clot burden and failed eliquis, will recommend coumadin with heparin or lovenox bridge after biopsy with target INR 2.5-3.5.  Plavix now switched to Cangrelor IV  Recent PE complicated by cardiac arrest and PCI On eliquis and cangrelor Given current embolic stroke, pt seems failed eliquis Further regimen may be considered.  LE venous doppler b/l popliteal, posterior tibial, peroneal and soleal acute DVT Given his clot burden and failed eliquis, will recommend coumadin with heparin or lovenox bridge after biopsy with target INR 2.5-3.5.   Diabetes HgbA1c 6.2 goal < 7.0 Controlled Currently on metformin CBG monitoring SSI close PCP follow up  Hypertension Stable Long term BP goal normotensive  Hyperlipidemia Home meds:  crestor 20  LDL 75.9, goal < 70 Now on crestor 40 Continue statin at discharge  Other Stroke Risk Factors Advanced age  Other Active Problems Gout - doing well now  Hospital day # 5  Neurology will sign off. Please call with questions. Pt will follow up with stroke clinic Dr. Leonie Man at [facility] in about 4 weeks. Thanks for the consult.   Rosalin Hawking, MD PhD Stroke Neurology 03/20/2021 1:13 PM    To contact Stroke Continuity provider, please refer to [email or web address] After hours, contact General Neurology

## 2021-03-20 NOTE — Progress Notes (Signed)
Family Medicine Teaching Service Daily Progress Note Intern Pager: [phone]  Patient name: Arthur [name] Medical record number: [id number] Date of birth: 12-19-1940 Age: 81 y.o. Gender: male  Primary Care Provider: Lind Covert, MD Consultants: Fatima Sanger, neuro Code Status: Full  Pt Overview and Major Events to Date:  2/16- admitted 2/17- clopidogrel held and IV cangrelor started 2/18- Code stroke called for vision loss, MRI idendified L PCA infarct  Assessment and Plan: Arthur [name] is a 81 y.o. male admitted for reversible anticoagulation therapy to have lung biopsy for suspicious pulmonary nodules now with Acute/Subacute L PCA Infarct.  PMH significant for cardiac arrest, CAD s/p PCI, PE on apixaban, gout, HTN, T2DM.  Acute L PCA infarct, presumed cardioembolic source   Acute DVT Hx PE   CAD s/p PCI with DES TCD bubble study yesterday was negative for PFO but did show possible intrapulmonary shunt. Pt remains on IV heparin gtt given Eliquis failure. Discussed with radiology yesterday and determined that given the acute appearance of DVT on [place], these represent a new development since January and therefore we can say with greater certainty that this is an Eliquis failure. APPT 176 last night. Dose adjustment per pharmacy. - Cont Heparin gtt, will likely bridge to Coumadin s/p procedure - Cont IV Cangrelor - Neuro following, appreciate their recs - Crestor 40mg  daily - Atenolol 50mg  BID - Pharmacy assisting with heparin dosing  Pulmonary Nodules suspicious for malignancy Biopsy scheduled for tomorrow with Pulm. Confirmed with them yesterday that they will still move forward with biopsy despite acute PCA infarct. - NPO at [place] for procedure  Hypertension BP is well controlled on Losartan 25mg  daily. - Continue losartan  T2DM Well-controlled. CBG 109-122.  - Continue metformin  Gout flare, much improved - Continue colchicine 0.6mg  daily - Continue allopurinol 100mg   daily   FEN/GI: Regular diet PPx: On heparin gtt Dispo:Pending lung biopsy  Subjective:  Arthur [name] reports feeling well this morning.  He was somewhat out of breath when I saw him having just ambulated to and from the bathroom.  He says that his vision is much improved today.  He is able to ambulate.  No chest pain or shortness of breath at rest.  Objective: Temp:  [97.9 F (36.6 C)-99.2 F (37.3 C)] 97.9 F (36.6 C) (02/21 0414) Pulse Rate:  [73-81] 79 (02/21 0414) Resp:  [16-18] 18 (02/21 0414) BP: (118-135)/(54-72) 121/54 (02/21 0414) SpO2:  [97 %-100 %] 97 % (02/21 0414) Physical Exam: General: Well-nourished elderly male, NAD Cardiovascular: Regular rate and rhythm, no murmur Respiratory: Normal work of breathing on room air, lungs clear to auscultation Neuro: Right hemianopia improved today, gait stable, speech fluent Extremities: BLE without edema or deformity  Laboratory: Recent Labs  Lab 03/18/21 0506 03/19/21 0121 03/20/21 0223  WBC 14.1* 14.8* 13.2*  HGB 12.2* 11.6* 10.6*  HCT 37.3* 35.4* 33.6*  PLT 294 297 285   Recent Labs  Lab 03/16/21 0435 03/18/21 0506 03/19/21 0121 03/20/21 0223  NA 135 132* 134* 134*  K 4.4 4.0 4.3 4.1  CL 103 102 104 103  CO2 23 18* 19* 22  BUN 21 19 20 22   CREATININE 1.24 1.37* 1.31* 1.33*  CALCIUM 9.1 8.7* 8.9 8.8*  PROT 6.6  --   --   --   BILITOT 0.6  --   --   --   ALKPHOS 109  --   --   --   ALT 14  --   --   --  AST 18  --   --   --   GLUCOSE 114* 140* 112* 109*     Imaging/Diagnostic Tests: VAS [place] TRANSCRANIAL DOPPLER W BUBBLES  Transcranial Doppler with Bubble  Patient Name:  Arthur [name] [facility]  Date of Exam:   03/19/2021 Medical Rec #: [id number]          Accession #:    [id number] Date of Birth: 1940/07/06           Patient Gender: M Patient Age:   59 years Exam Location:  [facility] Procedure:      VAS [place] TRANSCRANIAL DOPPLER W BUBBLES Referring Phys: Cornelius Moras  XU  --------------------------------------------------------------------------------   Indications: Stroke. History: History of DVT, positive lower extremity venous study on 03-18-2021. Comparison Study: 03-18-2021 Echo w/ bubble study was negative for evidence of                   atrial level shunt.  Performing Technologist: Darlin Coco RDMS, RVT    Examination Guidelines: A complete evaluation includes B-mode imaging, spectral Doppler, color Doppler, and power Doppler as needed of all accessible portions of each vessel. Bilateral testing is considered an integral part of a complete examination. Limited examinations for reoccurring indications may be performed as noted.     Summary:    A vascular evaluation was performed. The left middle cerebral artery was studied. An IV was inserted into the patient's right forearm. Verbal informed consent was obtained.   Less than 10 latent high intensity transient signals (HITS) were observed at rest and with valsalva maneuver, indicating a possible intrapulmonary shunt. Today's examination was limited due to suboptimal valsalva maneuver. *See table(s) above for TCD measurements and observations.      Preliminary   VAS [place] LOWER EXTREMITY VENOUS (DVT)  Lower Venous DVT Study  Patient Name:  Arthur [name] [facility]  Date of Exam:   03/18/2021 Medical Rec #: [id number]          Accession #:    [id number] Date of Birth: 1940/12/30           Patient Gender: M Patient Age:   54 years Exam Location:  [facility] Procedure:      VAS [place] LOWER EXTREMITY VENOUS (DVT) Referring Phys: Cornelius Moras XU  --------------------------------------------------------------------------------   Indications: Stroke, and pulmonary embolism.   Risk Factors: DVT 2017 Probable new lung cancer, awaiting biopsy. Comparison Study: Prior bilateral LEV done 05/19/15 with DVT noted in the right                   PT and Soleal veins  Performing Technologist: Sharion Dove RVS    Examination Guidelines: A complete evaluation includes B-mode imaging, spectral Doppler, color Doppler, and power Doppler as needed of all accessible portions of each vessel. Bilateral testing is considered an integral part of a complete examination. Limited examinations for reoccurring indications may be performed as noted. The reflux portion of the exam is performed with the patient in reverse Trendelenburg.     +---------+---------------+---------+-----------+----------+--------------+  RIGHT     Compressibility Phasicity Spontaneity Properties Thrombus Aging  +---------+---------------+---------+-----------+----------+--------------+  CFV       Full            Yes       Yes                                    +---------+---------------+---------+-----------+----------+--------------+  SFJ  Full                                                             +---------+---------------+---------+-----------+----------+--------------+  FV Prox   Full                                                             +---------+---------------+---------+-----------+----------+--------------+  FV Mid    Full                                                             +---------+---------------+---------+-----------+----------+--------------+  FV Distal Full                                                             +---------+---------------+---------+-----------+----------+--------------+  PFV       Full                                                             +---------+---------------+---------+-----------+----------+--------------+  POP       Partial         No        No                     Acute           +---------+---------------+---------+-----------+----------+--------------+  PTV       None                                             Acute           +---------+---------------+---------+-----------+----------+--------------+  PERO      None                                              Acute           +---------+---------------+---------+-----------+----------+--------------+  Soleal    None                                             Acute           +---------+---------------+---------+-----------+----------+--------------+        +---------+---------------+---------+-----------+----------+--------------+  LEFT      Compressibility Phasicity Spontaneity  Properties Thrombus Aging  +---------+---------------+---------+-----------+----------+--------------+  CFV       Full            Yes       Yes                                    +---------+---------------+---------+-----------+----------+--------------+  SFJ       Full                                                             +---------+---------------+---------+-----------+----------+--------------+  FV Prox   Full                                                             +---------+---------------+---------+-----------+----------+--------------+  FV Mid    Full                                                             +---------+---------------+---------+-----------+----------+--------------+  FV Distal Full                                                             +---------+---------------+---------+-----------+----------+--------------+  PFV       Full                                                             +---------+---------------+---------+-----------+----------+--------------+  POP       Partial         No        No                     Acute           +---------+---------------+---------+-----------+----------+--------------+  PTV       None                                             Acute           +---------+---------------+---------+-----------+----------+--------------+  PERO      None                                             Acute           +---------+---------------+---------+-----------+----------+--------------+  Soleal  None                                              Acute           +---------+---------------+---------+-----------+----------+--------------+             Summary: RIGHT: - Findings consistent with acute deep vein thrombosis involving the right popliteal vein, right posterior tibial veins, right peroneal veins, and right soleal veins.   LEFT: - Findings consistent with acute deep vein thrombosis involving the left posterior tibial veins, left popliteal vein, left peroneal veins, and left soleal veins.    *See table(s) above for measurements and observations.  Electronically signed by Monica Martinez MD on 03/19/2021 at 2:32:43 PM.      Final      Eppie Gibson, MD 03/20/2021, 6:54 AM PGY-1, [place] Intern pager: [phone], text pages welcome

## 2021-03-20 NOTE — Progress Notes (Addendum)
ANTICOAGULATION CONSULT NOTE - Initial Consult  Pharmacy Consult for Heparin Indication:  history of pulmonary embolus  and Acute DVTs  Allergies  Allergen Reactions   Ace Inhibitors     REACTION: Cough    Patient Measurements: Height: 5\' 11"  (180.3 cm) Weight: 69.5 kg (153 lb 4.8 oz) IBW/kg (Calculated) : 75.3 kg Heparin Dosing Weight: 69.5 kg  Vital Signs: Temp: 97.4 F (36.3 C) (02/21 0751) Temp Source: Oral (02/21 0751) BP: 120/64 (02/21 0751) Pulse Rate: 81 (02/21 0751)  Labs: Recent Labs    03/18/21 0506 03/18/21 0506 03/19/21 0121 03/19/21 1841 03/20/21 0223 03/20/21 1237  HGB 12.2*  --  11.6*  --  10.6*  --   HCT 37.3*  --  35.4*  --  33.6*  --   PLT 294  --  297  --  285  --   APTT  --    < > 39* 176* 122* 47*  HEPARINUNFRC  --   --  >1.10*  --  0.88*  --   CREATININE 1.37*  --  1.31*  --  1.33*  --    < > = values in this interval not displayed.     Estimated Creatinine Clearance: 43.5 mL/min (A) (by C-G formula based on SCr of 1.33 mg/dL (H)).   Medical History: Past Medical History:  Diagnosis Date   Diabetes mellitus without complication ([place])    High cholesterol    Hypertension    Normal nuclear stress test 2010   stress perfusion study apparently in 2010 in [facility] which he said was negative.    Assessment: 81 yo male presents for planned lung biopsy due to suspicious pulmonary nodules. PTA the patient is on apixaban for history of pulmonary embolus in 01/2021. The patient is planned for biopsy on 2/22, and the plan is for the patient to stop apixaban and transition to IV heparin 2 days prior to procedure. Pharmacy is consulted to dose heparin.   The recent apixaban administration can falsely elevate heparin levels, therefore, will monitor therapy using aPTTs until the aPTT begins to correlate with the heparin level. The patient is also on a Cangrelor infusion, which greatly increases the risk of bleeding. After discussion with team  C, the target levels will be reduced to the lower end of therapeutic range to avoid bleeding. Patient has been supratherapeutic on 1150 and 900 units/hr. Patient is currently subtherapeutic on 600 units/hr with aPTT of 47.   Goal of Therapy:  aPTT 66-85 or Heparin level 0.3-0.5 after DOAC washout Monitor platelets by anticoagulation protocol: Yes   Plan:  Increase heparin IV to 700 units/hr  Obtain a 8-hr aPTT after the start of heparin Monitor for signs and symptoms of bleeding Obtain a daily aPTT, heparin level, and CBC Follow up CCM plans to stop heparin/cangrelor prior to bronchoscopy  Thank you for allowing pharmacy to participate in this patient's care.  Reatha Harps, PharmD PGY1 Pharmacy Resident 03/20/2021 2:00 PM Check [email or web address] for unit specific pharmacy number

## 2021-03-20 NOTE — Progress Notes (Signed)
ANTICOAGULATION CONSULT NOTE Pharmacy Consult for Heparin Indication:  history of pulmonary embolus  and Acute DVTs  Allergies  Allergen Reactions   Ace Inhibitors     REACTION: Cough    Patient Measurements: Height: 5\' 11"  (180.3 cm) Weight: 69.5 kg (153 lb 4.8 oz) IBW/kg (Calculated) : 75.3 kg Heparin Dosing Weight: 69.5 kg  Vital Signs: Temp: 98.2 F (36.8 C) (02/21 2206) BP: 122/65 (02/21 2206) Pulse Rate: 72 (02/21 2206)  Labs: Recent Labs    03/18/21 0506 03/19/21 0121 03/19/21 1841 03/20/21 0223 03/20/21 1237 03/20/21 2226  HGB 12.2* 11.6*  --  10.6*  --   --   HCT 37.3* 35.4*  --  33.6*  --   --   PLT 294 297  --  285  --   --   APTT  --  39*   < > 122* 47* 65*  HEPARINUNFRC  --  >1.10*  --  0.88*  --   --   CREATININE 1.37* 1.31*  --  1.33*  --   --    < > = values in this interval not displayed.     Estimated Creatinine Clearance: 43.5 mL/min (A) (by C-G formula based on SCr of 1.33 mg/dL (H)).   Medical History: Past Medical History:  Diagnosis Date   Diabetes mellitus without complication ([place])    High cholesterol    Hypertension    Normal nuclear stress test 2010   stress perfusion study apparently in 2010 in [facility] which he said was negative.    Assessment: 81 yo male presents for planned lung biopsy due to suspicious pulmonary nodules. PTA the patient is on apixaban for history of pulmonary embolus in 01/2021. The patient is planned for biopsy on 2/22, and the plan is for the patient to stop apixaban and transition to IV heparin 2 days prior to procedure. Pharmacy is consulted to dose heparin.   The recent apixaban administration can falsely elevate heparin levels, therefore, will monitor therapy using aPTTs until the aPTT begins to correlate with the heparin level. The patient is also on a Cangrelor infusion, which greatly increases the risk of bleeding. After discussion with team C, the target levels will be reduced to the lower end  of therapeutic range to avoid bleeding. Patient has been supratherapeutic on 1150 and 900 units/hr. Patient is currently subtherapeutic on 600 units/hr with aPTT of 47.   3rd shift: PTT 65 seconds, no adjustment for now, to stop at 1:30 am  Goal of Therapy:  aPTT 66-85 or Heparin level 0.3-0.5 after DOAC washout Monitor platelets by anticoagulation protocol: Yes   Plan:  Continue heparin at 700 units / hr Obtain a daily aPTT, heparin level, and CBC Follow up CCM plans to stop heparin/cangrelor prior to bronchoscopy  Thank you for allowing pharmacy to participate in this patient's care.  Anette Guarneri, PharmD 03/20/2021 11:13 PM Check [email or web address] for unit specific pharmacy number

## 2021-03-21 ENCOUNTER — Inpatient Hospital Stay (HOSPITAL_COMMUNITY): Admission: RE | Admit: 2021-03-21 | Payer: Medicare Other | Source: Home / Self Care | Admitting: Pulmonary Disease

## 2021-03-21 ENCOUNTER — Inpatient Hospital Stay (HOSPITAL_COMMUNITY): Payer: Medicare Other | Admitting: Anesthesiology

## 2021-03-21 ENCOUNTER — Encounter (HOSPITAL_COMMUNITY): Payer: Self-pay | Admitting: Family Medicine

## 2021-03-21 ENCOUNTER — Other Ambulatory Visit (HOSPITAL_COMMUNITY): Payer: Self-pay

## 2021-03-21 ENCOUNTER — Encounter (HOSPITAL_COMMUNITY)
Admission: AD | Disposition: A | Discharge status: Home / Self Care | Payer: Self-pay | Source: Home / Self Care | Attending: Family Medicine

## 2021-03-21 DIAGNOSIS — R590 Localized enlarged lymph nodes: Secondary | ICD-10-CM

## 2021-03-21 DIAGNOSIS — R918 Other nonspecific abnormal finding of lung field: Secondary | ICD-10-CM

## 2021-03-21 DIAGNOSIS — I2510 Atherosclerotic heart disease of native coronary artery without angina pectoris: Secondary | ICD-10-CM

## 2021-03-21 DIAGNOSIS — R599 Enlarged lymph nodes, unspecified: Secondary | ICD-10-CM | POA: Insufficient documentation

## 2021-03-21 DIAGNOSIS — I10 Essential (primary) hypertension: Secondary | ICD-10-CM

## 2021-03-21 HISTORY — PX: FINE NEEDLE ASPIRATION: SHX6590

## 2021-03-21 HISTORY — PX: VIDEO BRONCHOSCOPY WITH ENDOBRONCHIAL ULTRASOUND: SHX6177

## 2021-03-21 LAB — CBC
HCT: 33.9 % — ABNORMAL LOW (ref 39.0–52.0)
Hemoglobin: 10.6 g/dL — ABNORMAL LOW (ref 13.0–17.0)
MCH: 25.8 pg — ABNORMAL LOW (ref 26.0–34.0)
MCHC: 31.3 g/dL (ref 30.0–36.0)
MCV: 82.5 fL (ref 80.0–100.0)
Platelets: 260 10*3/uL (ref 150–400)
RBC: 4.11 MIL/uL — ABNORMAL LOW (ref 4.22–5.81)
RDW: 13.8 % (ref 11.5–15.5)
WBC: 13.7 10*3/uL — ABNORMAL HIGH (ref 4.0–10.5)
nRBC: 0 % (ref 0.0–0.2)

## 2021-03-21 LAB — BASIC METABOLIC PANEL
Anion gap: 13 (ref 5–15)
BUN: 21 mg/dL (ref 8–23)
CO2: 18 mmol/L — ABNORMAL LOW (ref 22–32)
Calcium: 9 mg/dL (ref 8.9–10.3)
Chloride: 103 mmol/L (ref 98–111)
Creatinine, Ser: 1.36 mg/dL — ABNORMAL HIGH (ref 0.61–1.24)
GFR, Estimated: 53 mL/min — ABNORMAL LOW (ref >60)
Glucose, Bld: 84 mg/dL (ref 70–99)
Potassium: 4.9 mmol/L (ref 3.5–5.1)
Sodium: 134 mmol/L — ABNORMAL LOW (ref 135–145)

## 2021-03-21 LAB — GLUCOSE, CAPILLARY
Glucose-Capillary: 107 mg/dL — ABNORMAL HIGH (ref 70–99)
Glucose-Capillary: 99 mg/dL (ref 70–99)
Glucose-Capillary: 88 mg/dL (ref 70–99)
Glucose-Capillary: 166 mg/dL — ABNORMAL HIGH (ref 70–99)
Glucose-Capillary: 195 mg/dL — ABNORMAL HIGH (ref 70–99)

## 2021-03-21 LAB — APTT: aPTT: 62 seconds — ABNORMAL HIGH (ref 24–36)

## 2021-03-21 LAB — SARS CORONAVIRUS 2 BY RT PCR (HOSPITAL ORDER, PERFORMED IN ~~LOC~~ HOSPITAL LAB): SARS Coronavirus 2: NEGATIVE

## 2021-03-21 LAB — HEPARIN LEVEL (UNFRACTIONATED): Heparin Unfractionated: 0.22 IU/mL — ABNORMAL LOW (ref 0.30–0.70)

## 2021-03-21 SURGERY — BRONCHOSCOPY, WITH EBUS
Anesthesia: General | Laterality: Bilateral

## 2021-03-21 MED ORDER — FENTANYL CITRATE (PF) 100 MCG/2ML IJ SOLN
INTRAMUSCULAR | Status: AC
  Filled 2021-03-21: qty 2

## 2021-03-21 MED ORDER — FENTANYL CITRATE (PF) 250 MCG/5ML IJ SOLN
INTRAMUSCULAR | Status: DC | PRN
  Administered 2021-03-21: 75 ug via INTRAVENOUS
  Administered 2021-03-21: 25 ug via INTRAVENOUS

## 2021-03-21 MED ORDER — PROPOFOL 10 MG/ML IV BOLUS
INTRAVENOUS | Status: DC | PRN
  Administered 2021-03-21: 100 mg via INTRAVENOUS

## 2021-03-21 MED ORDER — LACTATED RINGERS IV SOLN
INTRAVENOUS | Status: DC
Start: 2021-03-21 — End: 2021-03-22

## 2021-03-21 MED ORDER — WARFARIN - PHARMACIST DOSING INPATIENT
Freq: Every day | Status: DC
Start: 2021-03-21 — End: 2021-03-21

## 2021-03-21 MED ORDER — ROCURONIUM BROMIDE 10 MG/ML (PF) SYRINGE
PREFILLED_SYRINGE | INTRAVENOUS | Status: DC | PRN
  Administered 2021-03-21: 50 mg via INTRAVENOUS

## 2021-03-21 MED ORDER — FENTANYL CITRATE (PF) 100 MCG/2ML IJ SOLN: 25.0000 ug | INTRAMUSCULAR | Status: DC | PRN

## 2021-03-21 MED ORDER — SUGAMMADEX SODIUM 200 MG/2ML IV SOLN
INTRAVENOUS | Status: DC | PRN
Start: 2021-03-21 — End: 2021-03-21
  Administered 2021-03-21: 200 mg via INTRAVENOUS

## 2021-03-21 MED ORDER — PHENYLEPHRINE HCL-NACL 20-0.9 MG/250ML-% IV SOLN
INTRAVENOUS | Status: DC | PRN
Start: 2021-03-21 — End: 2021-03-21
  Administered 2021-03-21: 40 ug/min via INTRAVENOUS

## 2021-03-21 MED ORDER — CLOPIDOGREL BISULFATE 75 MG PO TABS
75.0000 mg | ORAL_TABLET | Freq: Every day | ORAL | Status: DC
  Administered 2021-03-22: 75 mg via ORAL
  Filled 2021-03-21: qty 1

## 2021-03-21 MED ORDER — LIDOCAINE 2% (20 MG/ML) 5 ML SYRINGE
INTRAMUSCULAR | Status: DC | PRN
  Administered 2021-03-21: 60 mg via INTRAVENOUS

## 2021-03-21 MED ORDER — ONDANSETRON HCL 4 MG/2ML IJ SOLN
INTRAMUSCULAR | Status: DC | PRN
  Administered 2021-03-21: 4 mg via INTRAVENOUS

## 2021-03-21 MED ORDER — DEXAMETHASONE SODIUM PHOSPHATE 10 MG/ML IJ SOLN
INTRAMUSCULAR | Status: DC | PRN
  Administered 2021-03-21: 4 mg via INTRAVENOUS

## 2021-03-21 MED ORDER — CLOPIDOGREL BISULFATE 300 MG PO TABS
300.0000 mg | ORAL_TABLET | Freq: Once | ORAL | Status: AC
  Administered 2021-03-21: 300 mg via ORAL
  Filled 2021-03-21 (×2): qty 1

## 2021-03-21 MED ORDER — ENOXAPARIN SODIUM 80 MG/0.8ML IJ SOSY
70.0000 mg | PREFILLED_SYRINGE | Freq: Two times a day (BID) | INTRAMUSCULAR | Status: DC
Start: 2021-03-21 — End: 2021-03-22
  Administered 2021-03-21 – 2021-03-22 (×2): 70 mg via SUBCUTANEOUS
  Filled 2021-03-21 (×2): qty 0.8

## 2021-03-21 MED ORDER — CHLORHEXIDINE GLUCONATE 0.12 % MT SOLN
OROMUCOSAL | Status: AC
  Administered 2021-03-21: 15 mL via OROMUCOSAL
  Filled 2021-03-21: qty 15

## 2021-03-21 MED ORDER — CHLORHEXIDINE GLUCONATE 0.12 % MT SOLN
15.0000 mL | Freq: Once | OROMUCOSAL | Status: AC
Start: 2021-03-21 — End: 2021-03-21

## 2021-03-21 MED ORDER — SUCCINYLCHOLINE CHLORIDE 200 MG/10ML IV SOSY
PREFILLED_SYRINGE | INTRAVENOUS | Status: DC | PRN
Start: 2021-03-21 — End: 2021-03-21
  Administered 2021-03-21: 120 mg via INTRAVENOUS

## 2021-03-21 MED ORDER — WARFARIN SODIUM 2.5 MG PO TABS
2.5000 mg | ORAL_TABLET | Freq: Once | ORAL | Status: DC
  Filled 2021-03-21: qty 1

## 2021-03-21 MED ORDER — HEPARIN (PORCINE) 25000 UT/250ML-% IV SOLN
750.0000 [IU]/h | INTRAVENOUS | Status: AC
  Administered 2021-03-21: 750 [IU]/h via INTRAVENOUS
  Filled 2021-03-21 (×2): qty 250

## 2021-03-21 SURGICAL SUPPLY — 30 items
BRUSH CYTOL CELLEBRITY 1.5X140 (MISCELLANEOUS) IMPLANT
CANISTER SUCT 3000ML PPV (MISCELLANEOUS) ×3 IMPLANT
CONT SPEC 4OZ CLIKSEAL STRL BL (MISCELLANEOUS) ×3 IMPLANT
COVER BACK TABLE 60X90IN (DRAPES) ×3 IMPLANT
COVER DOME SNAP 22 D (MISCELLANEOUS) ×3 IMPLANT
FORCEPS BIOP RJ4 1.8 (CUTTING FORCEPS) IMPLANT
GAUZE SPONGE 4X4 12PLY STRL (GAUZE/BANDAGES/DRESSINGS) ×3 IMPLANT
GLOVE BIO SURGEON STRL SZ7.5 (GLOVE) ×3 IMPLANT
GOWN STRL REUS W/ TWL LRG LVL3 (GOWN DISPOSABLE) ×2 IMPLANT
GOWN STRL REUS W/TWL LRG LVL3 (GOWN DISPOSABLE) ×1
KIT CLEAN ENDO COMPLIANCE (KITS) ×6 IMPLANT
KIT TURNOVER KIT B (KITS) ×3 IMPLANT
MARKER SKIN DUAL TIP RULER LAB (MISCELLANEOUS) ×3 IMPLANT
NDL EBUS SONO TIP PENTAX (NEEDLE) ×2 IMPLANT
NEEDLE EBUS SONO TIP PENTAX (NEEDLE) ×3 IMPLANT
NS IRRIG 1000ML POUR BTL (IV SOLUTION) ×3 IMPLANT
OIL SILICONE PENTAX (PARTS (SERVICE/REPAIRS)) ×3 IMPLANT
PAD ARMBOARD 7.5X6 YLW CONV (MISCELLANEOUS) ×6 IMPLANT
SOL ANTI FOG 6CC (MISCELLANEOUS) ×2 IMPLANT
SOLUTION ANTI FOG 6CC (MISCELLANEOUS) ×1
SYR 20CC LL (SYRINGE) ×6 IMPLANT
SYR 20ML ECCENTRIC (SYRINGE) ×6 IMPLANT
SYR 50ML SLIP (SYRINGE) IMPLANT
SYR 5ML LUER SLIP (SYRINGE) ×3 IMPLANT
TOWEL OR 17X24 6PK STRL BLUE (TOWEL DISPOSABLE) ×3 IMPLANT
TRAP SPECIMEN MUCOUS 40CC (MISCELLANEOUS) IMPLANT
TUBE CONNECTING 20X1/4 (TUBING) ×6 IMPLANT
UNDERPAD 30X30 (UNDERPADS AND DIAPERS) ×3 IMPLANT
VALVE DISPOSABLE (MISCELLANEOUS) ×3 IMPLANT
WATER STERILE IRR 1000ML POUR (IV SOLUTION) ×3 IMPLANT

## 2021-03-21 NOTE — Interval H&P Note (Signed)
History and Physical Interval Note:  03/21/2021 10:57 AM  Arthur [name]  has presented today for surgery, with the diagnosis of Lung mass, adenopathy.  The various methods of treatment have been discussed with the patient and family. After consideration of risks, benefits and other options for treatment, the patient has consented to  Procedure(s): [name] (Bilateral) as a surgical intervention.  The patient's history has been reviewed, patient examined, no change in status, stable for surgery.  I have reviewed the patient's chart and labs.  Questions were answered to the patient's satisfaction.     [name]

## 2021-03-21 NOTE — Progress Notes (Signed)
Family Medicine Teaching Service Daily Progress Note Intern Pager: [phone]  Patient name: Arthur [name] Medical record number: [id number] Date of birth: 02-13-1940 Age: 81 y.o. Gender: male  Primary Care Provider: Lind Covert, MD Consultants: Arthur [name], Neuro Code Status: Full  Pt Overview and Major Events to Date:  2/16- admitted 2/17- clopidogrel held and IV cangrelor started 2/18- Code stroke called for vision loss, MRI idendified L PCA infarct 2/22- Video Bronchoscopy with Lung Biopsy  Assessment and Plan: Arthur [name] is a 81 y.o. male admitted for reversible anticoagulation therapy to have lung biopsy for suspicious pulmonary nodules now with Acute/Subacute L PCA Infarct.  PMH significant for cardiac arrest, CAD s/p PCI, PE on apixaban, gout, HTN, T2DM.  Pulmonary Nodules Suspicious for Malignancy Patient is scheduled for video bronchoscopy with lung biopsy today to evaluate for malignancy. High concern for malignancy given recent weight loss, decline in functional status, and apparent hypercoaguable state as evidenced by recurrent PE/DVT.  - Follow-up surgical path results - Pt will need diet re-started post-procedure  Acute L PCA infarct, presumed cardioembolic source   Acute DVT Hx PE   CAD s/p PCI with DES Patient remains on heparin gtt and IV cangrelor ahead of procedure this morning. Will plan heparin bridge to Warfarin post-procedurally. Can transition anti-platelet back to Plavix post-procedure - Heparin gtt - Cangrelor gtt - Outpt followup with stroke clinic, neuro signed off - Crestor 40mg  daily - Atenolol 50mg  BID  HTN BP well controlled on Losartan 25mg  daily  - Continue Losartan  T2DM Well-controlled.  - Continue metformin  Gout flare, much improved Colchicine decreased to once daily dosing yesterday. - Colchicine 0.6mg  daily - Allopurinol 100mg  daily  FEN/GI: Regular PPx: Heparin gtt, planned transition to Warfarin Dispo:Pending lung  biopsy  Subjective:  Arthur [name] reports feeling generally well this morning. He has no questions or concerns about his procedure today. We explained that he will need to stay in the hospital long enough to bridge from heparin to coumadin and that hopefully biopsy results will be available prior to discharge though this cannot be guaranteed.   Objective: Temp:  [97.4 F (36.3 C)-98.2 F (36.8 C)] 97.8 F (36.6 C) (02/22 0555) Pulse Rate:  [72-81] 73 (02/22 0555) Resp:  [16-19] 17 (02/22 0555) BP: (120-134)/(56-73) 134/73 (02/22 0555) SpO2:  [96 %-100 %] 97 % (02/22 0555) Physical Exam: General: Awake, lying in bed, NAD Cardiovascular: RRR, no m/r/g Respiratory: Normal WOB on RA, lungs clear Abdomen: Soft, non-tender, non-distended Extremities: Without edema or deformity  Laboratory: Recent Labs  Lab 03/19/21 0121 03/20/21 0223 03/21/21 0159  WBC 14.8* 13.2* 13.7*  HGB 11.6* 10.6* 10.6*  HCT 35.4* 33.6* 33.9*  PLT 297 285 260   Recent Labs  Lab 03/16/21 0435 03/18/21 0506 03/19/21 0121 03/20/21 0223 03/21/21 0159  NA 135   < > 134* 134* 134*  K 4.4   < > 4.3 4.1 4.9  CL 103   < > 104 103 103  CO2 23   < > 19* 22 18*  BUN 21   < > 20 22 21   CREATININE 1.24   < > 1.31* 1.33* 1.36*  CALCIUM 9.1   < > 8.9 8.8* 9.0  PROT 6.6  --   --   --   --   BILITOT 0.6  --   --   --   --   ALKPHOS 109  --   --   --   --   ALT 14  --   --   --   --  AST 18  --   --   --   --   GLUCOSE 114*   < > 112* 109* 84   < > = values in this interval not displayed.    Imaging/Diagnostic Tests: No new imaging, tests  Eppie Gibson, MD 03/21/2021, 6:45 AM PGY-1, [place] Intern pager: [phone], text pages welcome

## 2021-03-21 NOTE — Op Note (Signed)
Video Bronchoscopy with Endobronchial Ultrasound Procedure Note  Date of Operation: 03/21/2021  Pre-op Diagnosis: lung mass, adenopathy   Post-op Diagnosis: Lung mass, adenopathy   Surgeon: Garner Nash, DO   Assistants: None   Anesthesia: General endotracheal anesthesia  Operation: Flexible video fiberoptic bronchoscopy with endobronchial ultrasound and biopsies.  Estimated Blood Loss: Minimal  Complications: None   Indications and History: Arthur [name] is a 81 y.o. male with lung mass, mediastinal adenopathy.  The risks, benefits, complications, treatment options and expected outcomes were discussed with the patient.  The possibilities of pneumothorax, pneumonia, reaction to medication, pulmonary aspiration, perforation of a viscus, bleeding, failure to diagnose a condition and creating a complication requiring transfusion or operation were discussed with the patient who freely signed the consent.    Description of Procedure: The patient was examined in the preoperative area and history and data from the preprocedure consultation were reviewed. It was deemed appropriate to proceed.  The patient was taken to [facility] endoscopy room 3, identified as Bowen R Barthelemy and the procedure verified as Flexible Video Fiberoptic Bronchoscopy.  A Time Out was held and the above information confirmed. After being taken to the operating room general anesthesia was initiated and the patient  was orally intubated. The video fiberoptic bronchoscope was introduced via the endotracheal tube and a general inspection was performed which showed normal right and left lung anatomy no evidence of endobronchial lesion, partially splayed main carina. The standard scope was then withdrawn and the endobronchial ultrasound was used to identify and characterize the peritracheal, hilar and bronchial lymph nodes. Inspection showed enlarged pretracheal subcarinal and hilar adenopathy.  Due to the concern for metastatic  disease to the bone seen on PET scan only 1 location was targeted for tissue sampling and risk of bleeding.  Using real-time ultrasound guidance Wang needle biopsies were take from Station 7 nodes and were sent for cytology. The patient tolerated the procedure well without apparent complications. There was no significant blood loss. The bronchoscope was withdrawn. Anesthesia was reversed and the patient was taken to the PACU for recovery.   Samples: 1. Wang needle biopsies from Station 7 node  Plans:  The patient will be discharged from the PACU to home when recovered from anesthesia. We will review the cytology, pathology and microbiology results with the patient when they become available. Outpatient followup will be with Octavio Graves Tereka Thorley, DO.   Patient can restart cangrelor in 1 hour and restart heparin no bolus in 1 hour, 1:00PM  Discussed with pharmacy   [facility] 03/21/2021 12:09 PM

## 2021-03-21 NOTE — Discharge Instructions (Addendum)
Dear Arthur [name],  Thank you for letting us participate in your care. You were hospitalized for close monitoring and adjustment of your blood thinners ahead of a lung biopsy.  Unfortunately while you were here you had a small stroke that we think was due to failure of your Eliquis. You were treated with [place] transitioning your blood thinner to a medication called Lovenox. Please inject 80 mg (an entire pen, including the bubble) into the skin two times daily, at 8 am and 8 pm.  POST-HOSPITAL & CARE INSTRUCTIONS You will not be taking your Eliquis anymore.  Instead you are going to be taking the Lovenox injections.  For the time being, this will be a twice daily injection.  Once we have the biopsy results back and a more firm care plan, we can consider transitioning you either to a once daily injection or a different oral medication at sometime in the future. We will all be eagerly anticipating the results of your biopsy, once we have that information we will be able to come up with a plan for your care moving forward.  All members of your care team will be a part of these conversations, including Dr. Margaretha Sheffield office, your lung doctor, your heart doctor, and the neurologist. Because we got an echocardiogram of your heart this admission, I am canceling the appointment that you had for an echo at next Tuesday morning.  You will still go to your appointment with Dr. Erin Hearing at 10:10 AM. Go to your follow up appointments (listed below)   DOCTOR'S APPOINTMENT   Future Appointments  Date Time Provider [place]  03/27/2021 10:10 AM Lind Covert, MD [facility]  04/30/2021  9:00 AM Buford Dresser, MD [facility]    Follow-up Information     Garvin Fila, MD. Schedule an appointment as soon as possible for a visit in 1 month(s).   Specialties: Neurology, Radiology Why: stroke clinic Contact information: [address] [place]  [address] [phone]         June Leap L, DO. Schedule an appointment as soon as possible for a visit in 1 week(s).   Specialty: Pulmonary Disease Why: within 1-2 weeks of discharge to see Dr. Valeta Harms or Eric Form, NP Contact information: [place] [address] [phone]                 Take care and be well!  [facility]  [place],  [address] [phone]     Flexible Bronchoscopy, Care After This sheet gives you information about how to care for yourself after your test. Your doctor may also give you more specific instructions. If you have problems or questions, contact your doctor. Follow these instructions at home: Eating and drinking Do not eat or drink anything (not even water) for 2 hours after your test, or until your numbing medicine (local anesthetic) wears off. When your numbness is gone and your cough and gag reflexes have come back, you may: Eat only soft foods. Slowly drink liquids. The day after the test, go back to your normal diet. Driving Do not drive for 24 hours if you were given a medicine to help you relax (sedative). Do not drive or use heavy machinery while taking prescription pain medicine. General instructions  Take over-the-counter and prescription medicines only as told by your doctor. Return to your normal activities  as told. Ask what activities are safe for you. Do not use any products that have nicotine or tobacco in them. This includes cigarettes and e-cigarettes. If you need help quitting, ask your doctor. Keep all follow-up visits as told by your doctor. This is important. It is very important if you had a tissue sample (biopsy) taken. Get help right away if: You have shortness of breath that gets worse. You get light-headed. You feel like you are going to pass out (faint). You have chest pain. You  cough up: More than a little blood. More blood than before. Summary Do not eat or drink anything (not even water) for 2 hours after your test, or until your numbing medicine wears off. Do not use cigarettes. Do not use e-cigarettes. Get help right away if you have chest pain.  This information is not intended to replace advice given to you by your health care provider. Make sure you discuss any questions you have with your health care provider. Document Released: 11/11/2008 Document Revised: 12/27/2016 Document Reviewed: 02/02/2016 Elsevier Patient Education  2020 [employer].

## 2021-03-21 NOTE — Progress Notes (Signed)
FPTS Interim Progress Note  Discussed patient case with Dr. Irene Limbo, heme/onc, who suggested in the setting of his Eliquis failure we could transition him to Lovenox. We could eventually consider transitioning him to Xarelto but Lovenox would allow [place] to anticoagulate him until his lung biopsy results are back and we can develop a more directed plan of care.  Eppie Gibson, MD 03/21/2021, 1:40 PM PGY-1, [place] Medicine Service pager [phone]

## 2021-03-21 NOTE — Progress Notes (Addendum)
kk  Progress Note  Patient Name: NIMESH RIOLO Date of Encounter: 03/21/2021  Primary Cardiologist: Buford Dresser, MD   Subjective   Patient is seen at bedside with family. He denies chest pain or SOB. Denies LE edema or tenderness.   Inpatient Medications    Scheduled Meds:  allopurinol  100 mg Oral Daily   atenolol  25 mg Oral Daily   colchicine  0.6 mg Oral Daily   empagliflozin  10 mg Oral Daily   losartan  25 mg Oral Daily   metFORMIN  1,000 mg Oral BID WC   multivitamin with minerals  1 tablet Oral Daily   rosuvastatin  40 mg Oral Daily   Continuous Infusions:  PRN Meds: acetaminophen **OR** acetaminophen, albuterol   Vital Signs    Vitals:   03/20/21 1604 03/20/21 2206 03/21/21 0555 03/21/21 0803  BP: (!) 121/56 122/65 134/73 128/69  Pulse: 73 72 73 73  Resp: 19 16 17 17   Temp: 98.2 F (36.8 C) 98.2 F (36.8 C) 97.8 F (36.6 C) 98.2 F (36.8 C)  TempSrc:   Oral Oral  SpO2: 100% 96% 97% 98%  Weight:      Height:        Intake/Output Summary (Last 24 hours) at 03/21/2021 0941 Last data filed at 03/21/2021 0920 Gross per 24 hour  Intake 1255.68 ml  Output --  Net 1255.68 ml   Filed Weights   03/15/21 2119  Weight: 69.5 kg    Telemetry    NSR with prolonged PR - Personally Reviewed  ECG    2/17, normal sinus rhythm, first-degree AV block- Personally Reviewed  Physical Exam   GEN: No acute distress.   Cardiac: RRR, no murmurs, rubs, or gallops.  Respiratory: Clear to auscultation bilaterally. MS: No edema; No erythema or pain to palpation  Neuro:  Nonfocal  Psych: Normal affect   Labs    Chemistry Recent Labs  Lab 03/16/21 0435 03/18/21 0506 03/19/21 0121 03/20/21 0223 03/21/21 0159  NA 135   < > 134* 134* 134*  K 4.4   < > 4.3 4.1 4.9  CL 103   < > 104 103 103  CO2 23   < > 19* 22 18*  GLUCOSE 114*   < > 112* 109* 84  BUN 21   < > 20 22 21   CREATININE 1.24   < > 1.31* 1.33* 1.36*  CALCIUM 9.1   < > 8.9 8.8* 9.0   PROT 6.6  --   --   --   --   ALBUMIN 2.6*  --   --   --   --   AST 18  --   --   --   --   ALT 14  --   --   --   --   ALKPHOS 109  --   --   --   --   BILITOT 0.6  --   --   --   --   GFRNONAA 59*   < > 55* 54* 53*  ANIONGAP 9   < > 11 9 13    < > = values in this interval not displayed.     Hematology Recent Labs  Lab 03/19/21 0121 03/20/21 0223 03/21/21 0159  WBC 14.8* 13.2* 13.7*  RBC 4.35 4.09* 4.11*  HGB 11.6* 10.6* 10.6*  HCT 35.4* 33.6* 33.9*  MCV 81.4 82.2 82.5  MCH 26.7 25.9* 25.8*  MCHC 32.8 31.5 31.3  RDW 13.5 13.8 13.8  PLT 297 285 260    Cardiac EnzymesNo results for input(s): TROPONINI in the last 168 hours. No results for input(s): TROPIPOC in the last 168 hours.   BNPNo results for input(s): BNP, PROBNP in the last 168 hours.   DDimer No results for input(s): DDIMER in the last 168 hours.   Radiology    VAS [place] TRANSCRANIAL DOPPLER W BUBBLES  Result Date: 03/19/2021  Transcranial Doppler with Bubble Patient Name:  GURSHAAN MATSUOKA [facility]  Date of Exam:   03/19/2021 Medical Rec #: [id number]          Accession #:    [id number] Date of Birth: [birth year]           Patient Gender: M Patient Age:   81 years Exam Location:  [facility] Procedure:      VAS [place] TRANSCRANIAL DOPPLER W BUBBLES Referring Phys: Cornelius Moras XU --------------------------------------------------------------------------------  Indications: Stroke. History: History of DVT, positive lower extremity venous study on 03-18-2021. Comparison Study: 03-18-2021 Echo w/ bubble study was negative for evidence of                   atrial level shunt. Performing Technologist: Darlin Coco RDMS, RVT  Examination Guidelines: A complete evaluation includes B-mode imaging, spectral Doppler, color Doppler, and power Doppler as needed of all accessible portions of each vessel. Bilateral testing is considered an integral part of a complete examination. Limited examinations for reoccurring indications may be performed as  noted.  Summary:  A vascular evaluation was performed. The left middle cerebral artery was studied. An IV was inserted into the patient's right forearm. Verbal informed consent was obtained.  Less than 10 latent high intensity transient signals (HITS) were observed at rest and with valsalva maneuver, indicating a possible intrapulmonary shunt. Today's examination was limited due to suboptimal valsalva maneuver. *See table(s) above for TCD measurements and observations.    Preliminary     Cardiac Studies   TTE 2/19 1. Left ventricular ejection fraction, by estimation, is 55 to 60%. The  left ventricle has normal function. The left ventricle demonstrates  regional wall motion abnormalities (see scoring diagram/findings for  description). There is severe asymmetric  left ventricular hypertrophy of the basal-septal segment. Left ventricular  diastolic parameters are consistent with Grade II diastolic dysfunction  (pseudonormalization).   2. Right ventricular systolic function is normal. The right ventricular  size is normal. Tricuspid regurgitation signal is inadequate for assessing  PA pressure.   3. A small pericardial effusion is present. The pericardial effusion is  anterior to the right ventricle.   4. The mitral valve is grossly normal. No evidence of mitral valve  regurgitation. No evidence of mitral stenosis.   5. The aortic valve is calcified. Aortic valve regurgitation is not  visualized. Aortic valve sclerosis is present, with no evidence of aortic  valve stenosis.   6. Agitated saline contrast bubble study was negative, with no evidence  of any interatrial shunt.    Cath 1/23 LM/ostial LAD DES x 1 Ost LAD to Prox LAD lesion is 80% stenosed.   A drug-eluting stent was successfully placed using a STENT ONYX FRONTIER 3.5X15, postdailted to > 4 mm and optimized with IVUS.  Patient Profile     81 y.o. 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  Also developed left PCA infarct during his hospitalization thought is due to Eliquis  failure.  Assessment & Plan    CAD No anginal symptoms.  Currently on IV heparin and IV cangrelor for bronchoscopy and lung biopsy today. -Resume Plavix after the procedure -Continue Atenolol, Crestor, Jardiance and losartan  Recurrent DVT/PE Acute CVA History of PE in the setting of possible lung malignancy requiring life long anticoagulation. Thought to have Eliquis failure with acute infarct and acute DVT.  He will be switched to Coumadin with heparin bridge.  -Continue to monitor his Hgb  Hypertension Blood pressure normal.  Kidney function seems to be at baseline. -Continue losartan  Type 2 diabetes -Continue metformin and Jardiance  For questions or updates, please contact [place] Please consult [email or web address] for contact info under Cardiology/STEMI.      SignedGaylan Gerold, DO  03/21/2021, 9:41 AM     ATTENDING ATTESTATION:  After conducting a review of all available clinical information with the care team, interviewing the patient, and performing a physical exam, I agree with the findings and plan described in this note.   GEN: No acute distress.   Cardiac: RRR, no murmurs, rubs, or gallops.  Respiratory: Clear to auscultation bilaterally. GI: Soft, nontender, non-distended  MS: No edema; No deformity. Neuro:  Nonfocal  Vasc:  +2 radial pulses  S/p bronchoscopy today without issues.  Re-load with plavix 300 and start plavix 75mg  tomorrow qd.  Start LMWH and bridge to Coumadin.  Anticipate d/c in AM.  Lenna Sciara, MD Pager [phone]

## 2021-03-21 NOTE — TOC Benefit Eligibility Note (Signed)
Patient Teacher, English as a foreign language completed.    The patient is currently admitted and upon discharge could be taking enoxaparin (Lovenox) 70 mg. (Using the enoxaparin 80 mg/0.8 ml)  The current 30 day co-pay is, $0.00.   The patient is insured through [place], [place] Patient Advocate Specialist [place] Patient Advocate Team Direct Number: [phone]  Fax: [phone]

## 2021-03-21 NOTE — H&P (View-Only) (Signed)
NAME:  Arthur [name], MRN:  [id number], DOB:  05-22-1940, LOS: 6 ADMISSION DATE:  03/15/2021, CONSULTATION DATE: 03/21/2021 REFERRING MD:  Dr. Erin Hearing, CHIEF COMPLAINT:  lung mass   History of Present Illness:  This is an 81 year old gentleman, past medical history of type 2 diabetes, hypertension, hypercholesterolemia.Patient was recently admitted to the hospital on 02/03/2021 found to have a pulmonary embolism.  Ultimately was taken for heart catheterization with climbing troponins.  Prior to that during his hospitalization while in the emergency department suffered cardiac arrest required CPR and epinephrine.  ECG with VT.  He was taken to the Cath Lab found to have 80% stenosis of the mid LAD he underwent PCI with DES.  Also had balloon angioplasty to a separate lesion.  Was started on Plavix at discharge.  Here today for follow-up after recent nuclear medicine pet imaging which was completed on 02/21/2021.  Nuclear medicine pet imaging revealed hypermetabolic lesions within a right lower lobe lung nodule with associated adenopathy of the chest concerning for primary lung malignancy with associated metastatic disease potentially to the T11 spine as well as adenopathy.  This is concerning for advanced age bronchogenic carcinoma.  We reviewed CT pet imaging today in the office with patient's son and patient.   Patient was preadmitted to the hospital for Plavix washout placed on IV cangrelor and heparin.  Unfortunately during this hospitalization patient developed strokelike symptoms and had a stroke alert called.  He was deemed to have failure of his Eliquis and now also has plans to bridge to Coumadin.  Plan bronchoscopy for biopsy on 03/21/2021.  Pertinent  Medical History   Past Medical History:  Diagnosis Date   Diabetes mellitus without complication ([place])    High cholesterol    Hypertension    Normal nuclear stress test 2010   stress perfusion study apparently in 2010 in [facility]  which he said was negative.     Significant Hospital Events: Including procedures, antibiotic start and stop dates in addition to other pertinent events     Interim History / Subjective:  No issues overnight. Unfortunately heparin was not held until 640 this morning. And cangrelor was not stopped.  Therefore 7:30 AM case had to be rescheduled. Fortunately endoscopy was able to accommodate for an 11 AM start.  Objective   Blood pressure 134/73, pulse 73, temperature 97.8 F (36.6 C), temperature source Oral, resp. rate 17, height 5\' 11"  (1.803 m), weight 69.5 kg, SpO2 97 %.        Intake/Output Summary (Last 24 hours) at 03/21/2021 0732 Last data filed at 03/21/2021 0556 Gross per 24 hour  Intake 1455.74 ml  Output --  Net 1455.74 ml   Filed Weights   03/15/21 2119  Weight: 69.5 kg    Examination: General: Elderly gentleman resting comfortably in bed HENT: NCAT, tracking appropriately Lungs: Auscultation bilaterally no crackles no wheeze Cardiovascular: Regular rhythm S1-S2 Abdomen: Abdomen soft, nontender nondistended Extremities: No significant edema Neuro: Alert oriented following commands GU: Deferred  Resolved Hospital Problem list      Assessment & Plan:   Pulmonary nodule with associated adenopathy concerning for an advanced age bronchogenic carcinoma. 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 Heparin stopped this morning for a 4-hour window. Cangrelor to be stopped 1 hour prior to  procedure at 9:30 AM stop time. Bronchoscopy planned for today. We have talked about the risk of bleeding as well as the perioperative risk associated with his hypercoagulable state, recent stroke and MI. This will be the only way unfortunately for [place] to  obtain a tissue diagnosis and hopefully restart his anticoagulation ASAP. We will be able to restart his anticoagulation pending no significant bleeding complications related to his biopsy. Plans for video bronchoscopy with endobronchial ultrasound and transbronchial needle aspirations.   Labs   CBC: Recent Labs  Lab 03/16/21 0435 03/18/21 0506 03/19/21 0121 03/20/21 0223 03/21/21 0159  WBC 12.6* 14.1* 14.8* 13.2* 13.7*  HGB 11.1* 12.2* 11.6* 10.6* 10.6*  HCT 35.3* 37.3* 35.4* 33.6* 33.9*  MCV 82.5 81.1 81.4 82.2 82.5  PLT 281 294 297 285 161    Basic Metabolic Panel: Recent Labs  Lab 03/16/21 0435 03/18/21 0506 03/19/21 0121 03/20/21 0223 03/21/21 0159  NA 135 132* 134* 134* 134*  K 4.4 4.0 4.3 4.1 4.9  CL 103 102 104 103 103  CO2 23 18* 19* 22 18*  GLUCOSE 114* 140* 112* 109* 84  BUN 21 19 20 22 21   CREATININE 1.24 1.37* 1.31* 1.33* 1.36*  CALCIUM 9.1 8.7* 8.9 8.8* 9.0   GFR: Estimated Creatinine Clearance: 42.6 mL/min (A) (by C-G formula based on SCr of 1.36 mg/dL (H)). Recent Labs  Lab 03/18/21 0506 03/19/21 0121 03/20/21 0223 03/21/21 0159  WBC 14.1* 14.8* 13.2* 13.7*    Liver Function Tests: Recent Labs  Lab 03/16/21 0435  AST 18  ALT 14  ALKPHOS 109  BILITOT 0.6  PROT 6.6  ALBUMIN 2.6*   No results for input(s): LIPASE, AMYLASE in the last 168 hours. No results for input(s): AMMONIA in the last 168 hours.  ABG No results found for: PHART, PCO2ART, PO2ART, HCO3, TCO2, ACIDBASEDEF, O2SAT   Coagulation Profile: No results for input(s): INR, PROTIME in the last 168 hours.  Cardiac Enzymes: No results for input(s): CKTOTAL, CKMB, CKMBINDEX, TROPONINI in the last 168 hours.  HbA1C: HbA1c, POC (controlled diabetic range)  Date/Time Value Ref Range Status  01/16/2021 08:59 AM 6.3 0.0 - 7.0 % Final  10/18/2020 08:50 AM 6.4 0.0 - 7.0 % Final   Hgb A1c MFr Bld  Date/Time Value Ref Range Status  03/17/2021 12:10 PM 6.2 (H) 4.8 - 5.6 % Final     Comment:    (NOTE) Pre diabetes:          5.7%-6.4%  Diabetes:              >6.4%  Glycemic control for   <7.0% adults with diabetes     CBG: Recent Labs  Lab 03/19/21 1157 03/19/21 1654 03/20/21 0754 03/20/21 1128 03/20/21 2205  GLUCAP 166* 92 122* 141* 105*    Review of Systems:     Review of Systems  Constitutional:  Negative for chills, fever, malaise/fatigue and weight loss.  HENT:  Negative for hearing loss, sore throat and tinnitus.   Eyes:  Negative for blurred vision and double vision.  Respiratory:  Negative for cough, hemoptysis, sputum production, shortness of breath, wheezing and stridor.   Cardiovascular:  Negative for chest pain, palpitations, orthopnea, leg swelling and PND.  Gastrointestinal:  Negative for abdominal pain, constipation, diarrhea, heartburn, nausea and vomiting.  Genitourinary:  Negative for dysuria, hematuria and urgency.  Musculoskeletal:  Negative for joint pain and myalgias.  Skin:  Negative for itching and rash.  Neurological:  Negative for dizziness, tingling, weakness and headaches.  Endo/Heme/Allergies:  Negative for environmental allergies. Does not bruise/bleed easily.  Psychiatric/Behavioral:  Negative for depression. The patient is nervous/anxious. The patient does not have insomnia.   All other systems reviewed and are negative.   Past Medical History:  He,  has a past medical history of Diabetes mellitus without complication ([place]), High cholesterol, Hypertension, and Normal nuclear stress test (2010).   Surgical History:   Past Surgical History:  Procedure Laterality Date   CATARACT EXTRACTION, BILATERAL  2006   CORONARY STENT INTERVENTION N/A 02/06/2021   Procedure: CORONARY STENT INTERVENTION;  Surgeon: Jettie Booze, MD;  Location: [facility];  Service: Cardiovascular;  Laterality: N/A;   INTRAVASCULAR ULTRASOUND/IVUS N/A 02/06/2021   Procedure: Intravascular Ultrasound/IVUS;  Surgeon: Jettie Booze, MD;  Location: [facility];  Service: Cardiovascular;  Laterality: N/A;   KNEE SURGERY  2005   LEFT HEART CATH AND CORONARY ANGIOGRAPHY N/A 02/06/2021   Procedure: LEFT HEART CATH AND CORONARY ANGIOGRAPHY;  Surgeon: Jettie Booze, MD;  Location: [facility];  Service: Cardiovascular;  Laterality: N/A;     Social History:   reports that he has never smoked. He has never used smokeless tobacco. He reports that he does not drink alcohol and does not use drugs.   Family History:  His family history includes Coronary artery disease in his father and mother.   Allergies Allergies  Allergen Reactions   Ace Inhibitors     REACTION: Cough     Home Medications  Prior to Admission medications   Medication Sig Start Date End Date Taking? Authorizing Provider  acetaminophen (TYLENOL) 650 MG CR tablet Take 650 mg by mouth every 8 (eight) hours as needed for pain.   Yes [provider]  albuterol (PROAIR HFA) 108 (90 Base) MCG/ACT inhaler Inhale 2 puffs into the lungs every 4 (four) hours as needed for wheezing or shortness of breath. 02/08/21  Yes Autry-Lott, Naaman Plummer, DO  allopurinol (ZYLOPRIM) 100 MG tablet Take 1 tablet (100 mg total) by mouth daily. 03/13/21  Yes Chambliss, Jeb Levering, MD  apixaban (ELIQUIS) 5 MG TABS tablet Take 1 tablet (5 mg total) by mouth 2 (two) times daily. 02/14/21  Yes Eppie Gibson, MD  atenolol (TENORMIN) 50 MG tablet Take 1 tablet (50 mg total) by mouth 2 (two) times daily. 01/31/21  Yes Chambliss, Jeb Levering, MD  benzonatate (TESSALON) 100 MG capsule TAKE 1 CAPSULE BY MOUTH TWICE DAILY AS NEEDED FOR COUGH Patient taking differently: Take 100 mg by mouth 3 (three) times daily as needed for cough. 02/01/21  Yes Lind Covert, MD  clopidogrel (PLAVIX) 75 MG tablet Take 1 tablet (75 mg total) by mouth daily with breakfast. 02/14/21  Yes Eppie Gibson, MD  colchicine 0.6 MG tablet Take 1 tablet (0.6 mg total) by mouth  daily. Patient taking differently: Take 0.6 mg by mouth every 12 (twelve) hours. 03/13/21  Yes Chambliss, Jeb Levering, MD  diclofenac Sodium (VOLTAREN) 1 % GEL Apply 2 g topically 4 (four) times daily. Patient taking differently: Apply 2-4 g topically 4 (four) times daily as needed (knees/legs). 01/31/21  Yes Chambliss, Jeb Levering, MD  ferrous sulfate 324 (65 Fe) MG TBEC Take 324 mg by mouth daily. 08/17/20  Yes Lind Covert, MD  metFORMIN (GLUCOPHAGE) 1000 MG tablet Take 1 tablet (1,000 mg total) by mouth 2 (two) times daily with a meal. 01/31/21  Yes Chambliss, Jeb Levering, MD  Multiple Vitamins-Minerals (CENTRUM SILVER 50+MEN) TABS Take 1  tablet by mouth daily.   Yes [provider]  rosuvastatin (CRESTOR) 20 MG tablet Take 1 tablet (20 mg total) by mouth daily. 03/02/21  Yes Lind Covert, MD  trolamine salicylate (ASPER-FLEX) 10 % cream Apply 1 application topically as needed for muscle pain. 04/08/18  Yes Chambliss, Jeb Levering, MD  hydrochlorothiazide (HYDRODIURIL) 12.5 MG tablet Take 1 tablet (12.5 mg total) by mouth daily. Patient not taking: Reported on 03/16/2021 02/14/21   Eppie Gibson, MD  losartan (COZAAR) 100 MG tablet Take 1 tablet (100 mg total) by mouth at bedtime. Patient not taking: Reported on 03/16/2021 02/14/21   Eppie Gibson, MD     Garner Nash, DO [facility] 03/21/2021 7:37 AM

## 2021-03-21 NOTE — Progress Notes (Addendum)
ANTICOAGULATION CONSULT NOTE - Initial Consult  Pharmacy Consult for Heparin Indication:  history of pulmonary embolus  and Acute DVTs  Allergies  Allergen Reactions   Ace Inhibitors     REACTION: Cough    Patient Measurements: Height: 5\' 11"  (180.3 cm) Weight: 70.8 kg (156 lb) IBW/kg (Calculated) : 75.3 kg Heparin Dosing Weight: 69.5 kg  Vital Signs: Temp: 98.7 F (37.1 C) (02/22 1215) Temp Source: Oral (02/22 1006) BP: 125/62 (02/22 1215) Pulse Rate: 78 (02/22 1215)  Labs: Recent Labs    03/19/21 0121 03/19/21 1841 03/20/21 0223 03/20/21 1237 03/20/21 2226 03/21/21 0159  HGB 11.6*  --  10.6*  --   --  10.6*  HCT 35.4*  --  33.6*  --   --  33.9*  PLT 297  --  285  --   --  260  APTT 39*   < > 122* 47* 65* 62*  HEPARINUNFRC >1.10*  --  0.88*  --   --  0.22*  CREATININE 1.31*  --  1.33*  --   --  1.36*   < > = values in this interval not displayed.     Estimated Creatinine Clearance: 43.4 mL/min (A) (by C-G formula based on SCr of 1.36 mg/dL (H)).   Medical History: Past Medical History:  Diagnosis Date   Diabetes mellitus without complication ([place])    High cholesterol    Hypertension    Normal nuclear stress test 2010   stress perfusion study apparently in 2010 in [facility] which he said was negative.    Assessment: 81 yo male presented for planned lung biopsy on 03/21/21 due to suspicious pulmonary nodules on PET. PTA the patient is on apixaban for history of pulmonary embolus in 01/2021. The patient has had acute DVTs and embolic stroke on Eliquis in addition to an embolic stroke.The patient underwent bronchoscopy with biopsy today and pharmacy is consulted to resume heparin, start warfarin, and reinitiate Plavix.  The patient's Xa level of 0.22 and aPTT of 62 this morning on 700 units/hr of heparin correlate and labs can be consolidated to heparin levels alone. As levels were subtherapeutic, will increase heparin rate by 50 units/hr. Spoke with RN  and patient is in the recovery room, however no reports of bleeding per chart review. MD requested that heparin be started at 1pm today with Plavix load. INR goal confirmed with neurology.  Goal of Therapy:  Heparin level 0.3-0.7  INR 2.5-3.5  Monitor platelets by anticoagulation protocol: Yes   Plan:  Increase heparin IV to 750 units/hr  Start warfarin 2.5 mg tonight Give clopidogrel 300 mg today, then 75 mg daily Obtain a 8-hr heparin level  Monitor for signs and symptoms of bleeding Obtain a daily heparin level and CBC Follow up plans for anticoagulation per primary team, CCM, and Cards  Thank you for allowing pharmacy to participate in this patient's care.  Reatha Harps, PharmD PGY1 Pharmacy Resident 03/21/2021 12:25 PM Check [email or web address] for unit specific pharmacy number

## 2021-03-21 NOTE — Anesthesia Postprocedure Evaluation (Signed)
Anesthesia Post Note  Patient: Arthur [name]  Procedure(s) Performed: VIDEO BRONCHOSCOPY WITH ENDOBRONCHIAL ULTRASOUND (Bilateral) FINE NEEDLE ASPIRATION     Patient location during evaluation: PACU Anesthesia Type: General Level of consciousness: awake and alert and oriented Pain management: pain level controlled Vital Signs Assessment: post-procedure vital signs reviewed and stable Respiratory status: spontaneous breathing, nonlabored ventilation and respiratory function stable Cardiovascular status: blood pressure returned to baseline and stable Postop Assessment: no apparent nausea or vomiting Anesthetic complications: no   No notable events documented.  Last Vitals:  Vitals:   03/21/21 1006 03/21/21 1215  BP: (!) 166/68 125/62  Pulse: 84 78  Resp: 18 14  Temp: 36.7 C 37.1 C  SpO2: 100% 93%    Last Pain:  Vitals:   03/21/21 1215  TempSrc:   PainSc: 0-No pain                 Larena Ohnemus A.

## 2021-03-21 NOTE — Progress Notes (Addendum)
ANTICOAGULATION CONSULT NOTE - Initial Consult  Pharmacy Consult for Lovenox Indication:  history of pulmonary embolus  and Acute DVTs  Allergies  Allergen Reactions   Ace Inhibitors     REACTION: Cough    Patient Measurements: Height: 5\' 11"  (180.3 cm) Weight: 70.8 kg (156 lb) IBW/kg (Calculated) : 75.3 kg Heparin Dosing Weight: 69.5 kg  Vital Signs: Temp: 97.2 F (36.2 C) (02/22 1311) Temp Source: Oral (02/22 1311) BP: 148/69 (02/22 1311) Pulse Rate: 84 (02/22 1311)  Labs: Recent Labs    03/19/21 0121 03/19/21 1841 03/20/21 0223 03/20/21 1237 03/20/21 2226 03/21/21 0159  HGB 11.6*  --  10.6*  --   --  10.6*  HCT 35.4*  --  33.6*  --   --  33.9*  PLT 297  --  285  --   --  260  APTT 39*   < > 122* 47* 65* 62*  HEPARINUNFRC >1.10*  --  0.88*  --   --  0.22*  CREATININE 1.31*  --  1.33*  --   --  1.36*   < > = values in this interval not displayed.     Estimated Creatinine Clearance: 43.4 mL/min (A) (by C-G formula based on SCr of 1.36 mg/dL (H)).   Medical History: Past Medical History:  Diagnosis Date   Diabetes mellitus without complication ([place])    High cholesterol    Hypertension    Normal nuclear stress test 2010   stress perfusion study apparently in 2010 in [facility] which he said was negative.    Assessment: 81 yo male presented for planned lung biopsy on 03/21/21 due to suspicious pulmonary nodules on PET. PTA the patient is on apixaban for history of pulmonary embolus in 01/2021. The patient has had acute DVTs and embolic stroke on Eliquis in addition to an embolic stroke.The patient underwent bronchoscopy with biopsy today and pharmacy is consulted to start lovenox.   After discussion with family medicine, neurology, pulmonology, and input from heme/onc, pharmacy has been consulted to transition from heparin to Lovenox. No bleeding has been reported. I have concern that the patient will be able to reliable draw the syringe to the correct  dose due to his vision. I showed him the 80 mg Lovenox syringe and he was unable to locate the mL markers on the syringe, however the syringe package was unopened. I spoke to his nurse Ashli, who agreed to help him give his dose this evening. She will inform me how well he is able to administer the lovenox. If the patient has difficulty seeing the dose markers or drawing to the correct dose, I will change the dose to the nearest whole syringe. The patient would prefer 8 am and 8 pm administration.   Goal of Therapy:  Heparin level 0.3-0.7  INR 2.5-3.5  Monitor platelets by anticoagulation protocol: Yes   Plan:  Stop heparin IV at 8 pm tonight when lovenox is given Start lovenox 70 mg every 12 hours at 8 pm today Assess ability of patient to draw up 70 mg dose. If it is difficult, plan to give Lovenox 80 mg BID. Give clopidogrel 75 mg daily Monitor for signs and symptoms of bleeding  Thank you for allowing pharmacy to participate in this patient's care.  Reatha Harps, PharmD PGY1 Pharmacy Resident 03/21/2021 2:12 PM Check [email or web address] for unit specific pharmacy number

## 2021-03-21 NOTE — Progress Notes (Incomplete)
Pt educated on how to draw up 70 mg of Lovenox and asked to demonstrate ability to do it himself. Pt shows difficulty seeing the mL marker on the syringe but successfully executed the task requiring additional time and continued guidance. Pt instructed where to administer the medicine and at first pt showed hesitance to administer the injection to himself but proceeded afterwards. Pt hands were very shaky that without guidance, he could possibly poke his hands with the needle instead of the intended site. Pt may need more practice and somebody to guide him to safely administer Lovenox. The family expressed concern about the pt administering the med to himself and asks for RN service to do it for him at home. Nighttime pharmacist informed.

## 2021-03-21 NOTE — Progress Notes (Signed)
Heparin reportedly stopped at [facility] stopped at 0930 as per md instructions this morning

## 2021-03-21 NOTE — Transfer of Care (Signed)
Immediate Anesthesia Transfer of Care Note  Patient: Arthur [name]  Procedure(s) Performed: VIDEO BRONCHOSCOPY WITH ENDOBRONCHIAL ULTRASOUND (Bilateral) FINE NEEDLE ASPIRATION  Patient Location: PACU  Anesthesia Type:General  Level of Consciousness: awake, alert  and oriented  Airway & Oxygen Therapy: Patient Spontanous Breathing  Post-op Assessment: Report given to RN, Post -op Vital signs reviewed and stable and Patient moving all extremities  Post vital signs: Reviewed and stable  Last Vitals:  Vitals Value Taken Time  BP 125/62 03/21/21 1215  Temp 37.1 C 03/21/21 1215  Pulse 76 03/21/21 1219  Resp 19 03/21/21 1219  SpO2 91 % 03/21/21 1219  Vitals shown include unvalidated device data.  Last Pain:  Vitals:   03/21/21 1215  TempSrc:   PainSc: 0-No pain      Patients Stated Pain Goal: 0 (10/27/55 4734)  Complications: No notable events documented.

## 2021-03-21 NOTE — Consult Note (Signed)
NAME:  Arthur [name], MRN:  [id number], DOB:  1940/06/21, LOS: 6 ADMISSION DATE:  03/15/2021, CONSULTATION DATE: 03/21/2021 REFERRING MD:  Dr. Erin Hearing, CHIEF COMPLAINT:  lung mass   History of Present Illness:  This is an 81 year old gentleman, past medical history of type 2 diabetes, hypertension, hypercholesterolemia.Patient was recently admitted to the hospital on 02/03/2021 found to have a pulmonary embolism.  Ultimately was taken for heart catheterization with climbing troponins.  Prior to that during his hospitalization while in the emergency department suffered cardiac arrest required CPR and epinephrine.  ECG with VT.  He was taken to the Cath Lab found to have 80% stenosis of the mid LAD he underwent PCI with DES.  Also had balloon angioplasty to a separate lesion.  Was started on Plavix at discharge.  Here today for follow-up after recent nuclear medicine pet imaging which was completed on 02/21/2021.  Nuclear medicine pet imaging revealed hypermetabolic lesions within a right lower lobe lung nodule with associated adenopathy of the chest concerning for primary lung malignancy with associated metastatic disease potentially to the T11 spine as well as adenopathy.  This is concerning for advanced age bronchogenic carcinoma.  We reviewed CT pet imaging today in the office with patient's son and patient.   Patient was preadmitted to the hospital for Plavix washout placed on IV cangrelor and heparin.  Unfortunately during this hospitalization patient developed strokelike symptoms and had a stroke alert called.  He was deemed to have failure of his Eliquis and now also has plans to bridge to Coumadin.  Plan bronchoscopy for biopsy on 03/21/2021.  Pertinent  Medical History   Past Medical History:  Diagnosis Date   Diabetes mellitus without complication ([place])    High cholesterol    Hypertension    Normal nuclear stress test 2010   stress perfusion study apparently in 2010 in [facility]  which he said was negative.     Significant Hospital Events: Including procedures, antibiotic start and stop dates in addition to other pertinent events     Interim History / Subjective:  No issues overnight. Unfortunately heparin was not held until 640 this morning. And cangrelor was not stopped.  Therefore 7:30 AM case had to be rescheduled. Fortunately endoscopy was able to accommodate for an 11 AM start.  Objective   Blood pressure 134/73, pulse 73, temperature 97.8 F (36.6 C), temperature source Oral, resp. rate 17, height 5\' 11"  (1.803 m), weight 69.5 kg, SpO2 97 %.        Intake/Output Summary (Last 24 hours) at 03/21/2021 0732 Last data filed at 03/21/2021 0556 Gross per 24 hour  Intake 1455.74 ml  Output --  Net 1455.74 ml   Filed Weights   03/15/21 2119  Weight: 69.5 kg    Examination: General: Elderly gentleman resting comfortably in bed HENT: NCAT, tracking appropriately Lungs: Auscultation bilaterally no crackles no wheeze Cardiovascular: Regular rhythm S1-S2 Abdomen: Abdomen soft, nontender nondistended Extremities: No significant edema Neuro: Alert oriented following commands GU: Deferred  Resolved Hospital Problem list      Assessment & Plan:   Pulmonary nodule with associated adenopathy concerning for an advanced age bronchogenic carcinoma. 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 Heparin stopped this morning for a 4-hour window. Cangrelor to be stopped 1 hour prior to  procedure at 9:30 AM stop time. Bronchoscopy planned for today. We have talked about the risk of bleeding as well as the perioperative risk associated with his hypercoagulable state, recent stroke and MI. This will be the only way unfortunately for [place] to  obtain a tissue diagnosis and hopefully restart his anticoagulation ASAP. We will be able to restart his anticoagulation pending no significant bleeding complications related to his biopsy. Plans for video bronchoscopy with endobronchial ultrasound and transbronchial needle aspirations.   Labs   CBC: Recent Labs  Lab 03/16/21 0435 03/18/21 0506 03/19/21 0121 03/20/21 0223 03/21/21 0159  WBC 12.6* 14.1* 14.8* 13.2* 13.7*  HGB 11.1* 12.2* 11.6* 10.6* 10.6*  HCT 35.3* 37.3* 35.4* 33.6* 33.9*  MCV 82.5 81.1 81.4 82.2 82.5  PLT 281 294 297 285 179    Basic Metabolic Panel: Recent Labs  Lab 03/16/21 0435 03/18/21 0506 03/19/21 0121 03/20/21 0223 03/21/21 0159  NA 135 132* 134* 134* 134*  K 4.4 4.0 4.3 4.1 4.9  CL 103 102 104 103 103  CO2 23 18* 19* 22 18*  GLUCOSE 114* 140* 112* 109* 84  BUN 21 19 20 22 21   CREATININE 1.24 1.37* 1.31* 1.33* 1.36*  CALCIUM 9.1 8.7* 8.9 8.8* 9.0   GFR: Estimated Creatinine Clearance: 42.6 mL/min (A) (by C-G formula based on SCr of 1.36 mg/dL (H)). Recent Labs  Lab 03/18/21 0506 03/19/21 0121 03/20/21 0223 03/21/21 0159  WBC 14.1* 14.8* 13.2* 13.7*    Liver Function Tests: Recent Labs  Lab 03/16/21 0435  AST 18  ALT 14  ALKPHOS 109  BILITOT 0.6  PROT 6.6  ALBUMIN 2.6*   No results for input(s): LIPASE, AMYLASE in the last 168 hours. No results for input(s): AMMONIA in the last 168 hours.  ABG No results found for: PHART, PCO2ART, PO2ART, HCO3, TCO2, ACIDBASEDEF, O2SAT   Coagulation Profile: No results for input(s): INR, PROTIME in the last 168 hours.  Cardiac Enzymes: No results for input(s): CKTOTAL, CKMB, CKMBINDEX, TROPONINI in the last 168 hours.  HbA1C: HbA1c, POC (controlled diabetic range)  Date/Time Value Ref Range Status  01/16/2021 08:59 AM 6.3 0.0 - 7.0 % Final  10/18/2020 08:50 AM 6.4 0.0 - 7.0 % Final   Hgb A1c MFr Bld  Date/Time Value Ref Range Status  03/17/2021 12:10 PM 6.2 (H) 4.8 - 5.6 % Final     Comment:    (NOTE) Pre diabetes:          5.7%-6.4%  Diabetes:              >6.4%  Glycemic control for   <7.0% adults with diabetes     CBG: Recent Labs  Lab 03/19/21 1157 03/19/21 1654 03/20/21 0754 03/20/21 1128 03/20/21 2205  GLUCAP 166* 92 122* 141* 105*    Review of Systems:     Review of Systems  Constitutional:  Negative for chills, fever, malaise/fatigue and weight loss.  HENT:  Negative for hearing loss, sore throat and tinnitus.   Eyes:  Negative for blurred vision and double vision.  Respiratory:  Negative for cough, hemoptysis, sputum production, shortness of breath, wheezing and stridor.   Cardiovascular:  Negative for chest pain, palpitations, orthopnea, leg swelling and PND.  Gastrointestinal:  Negative for abdominal pain, constipation, diarrhea, heartburn, nausea and vomiting.  Genitourinary:  Negative for dysuria, hematuria and urgency.  Musculoskeletal:  Negative for joint pain and myalgias.  Skin:  Negative for itching and rash.  Neurological:  Negative for dizziness, tingling, weakness and headaches.  Endo/Heme/Allergies:  Negative for environmental allergies. Does not bruise/bleed easily.  Psychiatric/Behavioral:  Negative for depression. The patient is nervous/anxious. The patient does not have insomnia.   All other systems reviewed and are negative.   Past Medical History:  He,  has a past medical history of Diabetes mellitus without complication ([place]), High cholesterol, Hypertension, and Normal nuclear stress test (2010).   Surgical History:   Past Surgical History:  Procedure Laterality Date   CATARACT EXTRACTION, BILATERAL  2006   CORONARY STENT INTERVENTION N/A 02/06/2021   Procedure: CORONARY STENT INTERVENTION;  Surgeon: Jettie Booze, MD;  Location: [facility];  Service: Cardiovascular;  Laterality: N/A;   INTRAVASCULAR ULTRASOUND/IVUS N/A 02/06/2021   Procedure: Intravascular Ultrasound/IVUS;  Surgeon: Jettie Booze, MD;  Location: [facility];  Service: Cardiovascular;  Laterality: N/A;   KNEE SURGERY  2005   LEFT HEART CATH AND CORONARY ANGIOGRAPHY N/A 02/06/2021   Procedure: LEFT HEART CATH AND CORONARY ANGIOGRAPHY;  Surgeon: Jettie Booze, MD;  Location: [facility];  Service: Cardiovascular;  Laterality: N/A;     Social History:   reports that he has never smoked. He has never used smokeless tobacco. He reports that he does not drink alcohol and does not use drugs.   Family History:  His family history includes Coronary artery disease in his father and mother.   Allergies Allergies  Allergen Reactions   Ace Inhibitors     REACTION: Cough     Home Medications  Prior to Admission medications   Medication Sig Start Date End Date Taking? Authorizing Provider  acetaminophen (TYLENOL) 650 MG CR tablet Take 650 mg by mouth every 8 (eight) hours as needed for pain.   Yes [provider]  albuterol (PROAIR HFA) 108 (90 Base) MCG/ACT inhaler Inhale 2 puffs into the lungs every 4 (four) hours as needed for wheezing or shortness of breath. 02/08/21  Yes Autry-Lott, Naaman Plummer, DO  allopurinol (ZYLOPRIM) 100 MG tablet Take 1 tablet (100 mg total) by mouth daily. 03/13/21  Yes Chambliss, Jeb Levering, MD  apixaban (ELIQUIS) 5 MG TABS tablet Take 1 tablet (5 mg total) by mouth 2 (two) times daily. 02/14/21  Yes Eppie Gibson, MD  atenolol (TENORMIN) 50 MG tablet Take 1 tablet (50 mg total) by mouth 2 (two) times daily. 01/31/21  Yes Chambliss, Jeb Levering, MD  benzonatate (TESSALON) 100 MG capsule TAKE 1 CAPSULE BY MOUTH TWICE DAILY AS NEEDED FOR COUGH Patient taking differently: Take 100 mg by mouth 3 (three) times daily as needed for cough. 02/01/21  Yes Lind Covert, MD  clopidogrel (PLAVIX) 75 MG tablet Take 1 tablet (75 mg total) by mouth daily with breakfast. 02/14/21  Yes Eppie Gibson, MD  colchicine 0.6 MG tablet Take 1 tablet (0.6 mg total) by mouth  daily. Patient taking differently: Take 0.6 mg by mouth every 12 (twelve) hours. 03/13/21  Yes Chambliss, Jeb Levering, MD  diclofenac Sodium (VOLTAREN) 1 % GEL Apply 2 g topically 4 (four) times daily. Patient taking differently: Apply 2-4 g topically 4 (four) times daily as needed (knees/legs). 01/31/21  Yes Chambliss, Jeb Levering, MD  ferrous sulfate 324 (65 Fe) MG TBEC Take 324 mg by mouth daily. 08/17/20  Yes Lind Covert, MD  metFORMIN (GLUCOPHAGE) 1000 MG tablet Take 1 tablet (1,000 mg total) by mouth 2 (two) times daily with a meal. 01/31/21  Yes Chambliss, Jeb Levering, MD  Multiple Vitamins-Minerals (CENTRUM SILVER 50+MEN) TABS Take 1  tablet by mouth daily.   Yes [provider]  rosuvastatin (CRESTOR) 20 MG tablet Take 1 tablet (20 mg total) by mouth daily. 03/02/21  Yes Lind Covert, MD  trolamine salicylate (ASPER-FLEX) 10 % cream Apply 1 application topically as needed for muscle pain. 04/08/18  Yes Chambliss, Jeb Levering, MD  hydrochlorothiazide (HYDRODIURIL) 12.5 MG tablet Take 1 tablet (12.5 mg total) by mouth daily. Patient not taking: Reported on 03/16/2021 02/14/21   Eppie Gibson, MD  losartan (COZAAR) 100 MG tablet Take 1 tablet (100 mg total) by mouth at bedtime. Patient not taking: Reported on 03/16/2021 02/14/21   Eppie Gibson, MD     Garner Nash, DO [facility] 03/21/2021 7:37 AM

## 2021-03-21 NOTE — Anesthesia Procedure Notes (Signed)
Procedure Name: Intubation Date/Time: 03/21/2021 11:25 AM Performed by: Amadeo Garnet, CRNA Pre-anesthesia Checklist: Patient identified, Emergency Drugs available, Suction available and Patient being monitored Patient Re-evaluated:Patient Re-evaluated prior to induction Oxygen Delivery Method: Circle system utilized Preoxygenation: Pre-oxygenation with 100% oxygen Induction Type: IV induction and Rapid sequence Ventilation: Mask ventilation without difficulty Laryngoscope Size: Mac and 4 Grade View: Grade I Tube type: Oral Tube size: 8.0 mm Number of attempts: 1 Airway Equipment and Method: Stylet and Oral airway Placement Confirmation: ETT inserted through vocal cords under direct vision, positive ETCO2 and breath sounds checked- equal and bilateral Secured at: 22 cm Tube secured with: Tape Dental Injury: Teeth and Oropharynx as per pre-operative assessment

## 2021-03-22 ENCOUNTER — Encounter: Payer: Self-pay | Admitting: Family Medicine

## 2021-03-22 ENCOUNTER — Encounter (HOSPITAL_COMMUNITY): Payer: Self-pay | Admitting: Pulmonary Disease

## 2021-03-22 ENCOUNTER — Other Ambulatory Visit (HOSPITAL_COMMUNITY): Payer: Self-pay

## 2021-03-22 LAB — CBC
HCT: 32 % — ABNORMAL LOW (ref 39.0–52.0)
Hemoglobin: 10.3 g/dL — ABNORMAL LOW (ref 13.0–17.0)
MCH: 26 pg (ref 26.0–34.0)
MCHC: 32.2 g/dL (ref 30.0–36.0)
MCV: 80.8 fL (ref 80.0–100.0)
Platelets: 248 10*3/uL (ref 150–400)
RBC: 3.96 MIL/uL — ABNORMAL LOW (ref 4.22–5.81)
RDW: 13.7 % (ref 11.5–15.5)
WBC: 12.5 10*3/uL — ABNORMAL HIGH (ref 4.0–10.5)
nRBC: 0 % (ref 0.0–0.2)

## 2021-03-22 LAB — BASIC METABOLIC PANEL
Anion gap: 10 (ref 5–15)
BUN: 24 mg/dL — ABNORMAL HIGH (ref 8–23)
CO2: 20 mmol/L — ABNORMAL LOW (ref 22–32)
Calcium: 8.8 mg/dL — ABNORMAL LOW (ref 8.9–10.3)
Chloride: 105 mmol/L (ref 98–111)
Creatinine, Ser: 1.56 mg/dL — ABNORMAL HIGH (ref 0.61–1.24)
GFR, Estimated: 45 mL/min — ABNORMAL LOW (ref >60)
Glucose, Bld: 118 mg/dL — ABNORMAL HIGH (ref 70–99)
Potassium: 5.6 mmol/L — ABNORMAL HIGH (ref 3.5–5.1)
Sodium: 135 mmol/L (ref 135–145)

## 2021-03-22 LAB — GLUCOSE, CAPILLARY
Glucose-Capillary: 150 mg/dL — ABNORMAL HIGH (ref 70–99)
Glucose-Capillary: 115 mg/dL — ABNORMAL HIGH (ref 70–99)

## 2021-03-22 MED ORDER — LOSARTAN POTASSIUM 25 MG PO TABS
25.0000 mg | ORAL_TABLET | Freq: Every day | ORAL | 0 refills | Status: DC
  Filled 2021-03-22: qty 30, 30d supply, fill #0

## 2021-03-22 MED ORDER — ATENOLOL 25 MG PO TABS
25.0000 mg | ORAL_TABLET | Freq: Every day | ORAL | 0 refills | Status: DC
  Filled 2021-03-22: qty 30, 30d supply, fill #0

## 2021-03-22 MED ORDER — ENOXAPARIN SODIUM 80 MG/0.8ML IJ SOSY
80.0000 mg | PREFILLED_SYRINGE | Freq: Two times a day (BID) | INTRAMUSCULAR | 0 refills | Status: DC
  Filled 2021-03-22: qty 48, 30d supply, fill #0

## 2021-03-22 MED ORDER — COLCHICINE 0.6 MG PO TABS
0.3000 mg | ORAL_TABLET | Freq: Every day | ORAL | 1 refills | Status: DC
  Filled 2021-03-22: qty 30, 60d supply, fill #0

## 2021-03-22 MED ORDER — SODIUM ZIRCONIUM CYCLOSILICATE 10 G PO PACK
10.0000 g | PACK | Freq: Once | ORAL | Status: AC
  Administered 2021-03-22: 10 g via ORAL
  Filled 2021-03-22: qty 1

## 2021-03-22 MED ORDER — ROSUVASTATIN CALCIUM 40 MG PO TABS
40.0000 mg | ORAL_TABLET | Freq: Every day | ORAL | 0 refills | Status: DC
  Filled 2021-03-22: qty 30, 30d supply, fill #0

## 2021-03-22 MED ORDER — ENOXAPARIN SODIUM 80 MG/0.8ML IJ SOSY: 80.0000 mg | PREFILLED_SYRINGE | Freq: Two times a day (BID) | INTRAMUSCULAR | Status: DC

## 2021-03-22 MED ORDER — CLOPIDOGREL BISULFATE 75 MG PO TABS
75.0000 mg | ORAL_TABLET | Freq: Every day | ORAL | 1 refills | Status: DC
  Filled 2021-03-22: qty 30, 30d supply, fill #0

## 2021-03-22 MED ORDER — ENOXAPARIN (LOVENOX) PATIENT EDUCATION KIT
PACK | Freq: Once | Status: AC
  Filled 2021-03-22: qty 1

## 2021-03-22 MED ORDER — EMPAGLIFLOZIN 10 MG PO TABS
10.0000 mg | ORAL_TABLET | Freq: Every day | ORAL | 0 refills | Status: DC
  Filled 2021-03-22: qty 30, 30d supply, fill #0

## 2021-03-22 NOTE — Progress Notes (Signed)
FPTS Brief Progress Note  S: Patient sleeping soundly.  [name] nurse has some concerns about patient's ability to give himself Lovenox injections.  States he is very shaky and she is afraid he may prick his finger.  Earlier in the evening, received page that patient had a run of junctional tachycardia but patient was asymptomatic.  This was brief and patient returned to normal sinus rhythm.   O: BP (!) 144/65 (BP Location: Right Arm)    Pulse 84    Temp 97.6 F (36.4 C) (Oral)    Resp 18    Ht 5\' 11"  (1.803 m)    Wt 70.8 kg    SpO2 98%    BMI 21.76 kg/m   General: Patient sleeping Cardiology: NSR on monitor Respiratory: Patient breathing comfortably on room air  A/P: Pulmonary nodules suspicious for malignancy   acute L PCA infarct   Hx PE   CAD Patient had lung biopsy today, transitioned from heparin to Lovenox -Continue to follow plan as outlined in day team's progress notes  Precious Gilding, DO 03/22/2021, 2:22 AM PGY-1, [facility] Night Resident  Please page [phone] with questions.

## 2021-03-22 NOTE — Progress Notes (Signed)
[facility]  Case discussed with Dr. Valeta Harms OK to resume anti-platelet therapy No further biopsies needed at this time  PCCM available prn  Roselie Awkward, MD [facility] Pager: [phone] Cell: [phone] After 7:00 pm call Elink  [phone]

## 2021-03-22 NOTE — Progress Notes (Signed)
Patient self administered lovenox for this morning's dose. Patient had difficulty seeing the numbers on the syringe to properly dose the medication to the ordered dose. Pharmacist informed and aware. But was otherwise able to administer injection.   Nurse from previous shift said he had trouble with self administration because of his hand shaking. However, I did not observe this issue with him this AM.

## 2021-03-22 NOTE — Discharge Summary (Signed)
[facility] Discharge Summary  Patient name: Arthur [name] Medical record number: [id number] Date of birth: 12-02-1940 Age: 81 y.o. Gender: male Date of Admission: 03/15/2021  Date of Discharge: 03/22/2021 Admitting Physician: Zola Button, MD  Primary Care Provider: Lind Covert, MD Consultants: Pulmonology, Neurology, Cardiology  Indication for Hospitalization: Plavix washout for lung biopsy  Discharge Diagnoses/Problem List:  Principal Problem:   Pulmonary nodules/lesions, multiple Active Problems:   Adenopathy   Cerebral embolism with cerebral infarction    Disposition: Home  Discharge Condition: Stable  Discharge Exam:  General: Very pleasant elderly gentleman, sitting up in recliner, NAD Cardio: RRR, no m/r/g Respiratory: Normal WOB on RA, lungs CTAB Abd: Soft, non-tender, non-distended Ext: Without edema or deformity    Brief Hospital Course:  Arthur [name] is a 81 y.o. male presenting for planned admission for reversible anticoagulant therapy to have lung biopsy for suspicious pulmonary nodules. PMH is significant for cardiac arrest, CAD s/p PCI, PE on apixaban, gout, HTN, T2DM.  Pulmonary Nodules Patient presented for a planned admission for a Plavix washout and transition to reversible anticoagulation therapy for lung biopsy after a PET scan raised concern for metastatic lung disease. Patient underwent washout of clopidogrel and apixiban with close monitoring (due to recent PE complicated by cardiac arrest and subsequent PCI with DES). Patient transitioned to Heparin and Cangrelor. Shortly after the transition from Plavix to Cangrelor, and while the patient was still on Eliquis, he developed neurologic symptoms and was found to have an acute/subacute left PCA infarct as discussed below. He was stable on Cangrelor and Heparin drips and underwent lung biopsy on 2/22. He tolerated the procedure without issue.   Acute/subacute  left PCA infarct Pt reported vision change in R eye, found to have loss of peripheral vision in R eye. CT head showed acute/subacute left PCA infarct, no hemorrhage, MRI/A showed acute left PCA infarct with other small infarcts (likely embolic), no LVO, no stenosis. DVT US was obtained which showed acute bilateral DVTs thought to be new since his last admission 6 weeks prior. Unfortunately, he did not have scans during that admission so we cannot say definitively that these were new clots. However, we are treating these clots as a failure of eliquis. His case was discussed with heme/onc who recommended transition to therapeutic Lovenox to treat VTE in the setting of likely cancer.    CAD   HTN S/p PCI with DES. Anticoagulation transitioned to reversible therapy for lung biopsy (as above). Patient discharged on Plavix and therapeutic Lovenox. [name] home antihypertensive regimen was atenolol 50mg  BID, HCTZ 12.5mg  daily, and Losartan 100mg  QHS. These were dose-reduced during this hospitalization and he was discharged on Atenolol 25mg  daily and Losartan 25mg  daily.   Gout Flare diagnosed prior to admission in the setting of nonadherence to allopurinol. He came in on colchicine which was dose-reduced during his stay first to 0.6mg  daily and then further to 0.3mg  daily prior to discharge.   Hyperkalemia Patient had a potassium of 5.6 on the day of discharge. We gave him a 1x dose of Lokelma prior to discharge and will have PCP follow this up closely.   Issues for follow-up: Lung biopsy will need to be followed up. His presentation raises concern for an aggressive malignancy and he may be appropriate for a palliative care consult. Patient was having some trouble with self-administering Lovenox dosing. Please ask if this has been an ongoing issue  Recommend that patient continue on BID Lovenox dosing until  he has a chance to meet with heme/onc. Defer decision to adjust anticoagulation to them.  Recheck a  BMP to assess K. Monitor gout symptoms, d/c colchicine when able.    Significant Procedures: Lung Biopsy 2/22 with Dr. Leory Plowman Icard  Significant Labs and Imaging:  Recent Labs  Lab 03/20/21 0223 03/21/21 0159 03/22/21 0444  WBC 13.2* 13.7* 12.5*  HGB 10.6* 10.6* 10.3*  HCT 33.6* 33.9* 32.0*  PLT 285 260 248   Recent Labs  Lab 03/16/21 0435 03/18/21 0506 03/19/21 0121 03/20/21 0223 03/21/21 0159 03/22/21 0444  NA 135 132* 134* 134* 134* 135  K 4.4 4.0 4.3 4.1 4.9 5.6*  CL 103 102 104 103 103 105  CO2 23 18* 19* 22 18* 20*  GLUCOSE 114* 140* 112* 109* 84 118*  BUN 21 19 20 22 21  24*  CREATININE 1.24 1.37* 1.31* 1.33* 1.36* 1.56*  CALCIUM 9.1 8.7* 8.9 8.8* 9.0 8.8*  ALKPHOS 109  --   --   --   --   --   AST 18  --   --   --   --   --   ALT 14  --   --   --   --   --   ALBUMIN 2.6*  --   --   --   --   --    MRA NECK WITHOUT AND WITH CONTRAST   TECHNIQUE: Multiplanar, multiecho pulse sequences of the brain and surrounding structures were obtained without and with intravenous contrast. Angiographic images of the Circle of Willis were obtained using MRA technique without intravenous contrast. Angiographic images of the neck were obtained using MRA technique without and with intravenous contrast. Carotid stenosis measurements (when applicable) are obtained utilizing NASCET criteria, using the distal internal carotid diameter as the denominator.   CONTRAST:  18mL GADAVIST GADOBUTROL 1 MMOL/ML IV SOLN   COMPARISON:  None.   FINDINGS: MRI HEAD FINDINGS   Brain: Acute infarcts within the left greater than right occipital lobes, confluent in left. Additional punctate infarct in the right temporal lobe and multiple punctate infarcts in bilateral frontal white matter. Single punctate infarct in the right cerebellum. These infarcts are associated with edema, greatest in the left occipital lobe. No significant mass effect. No evidence of acute hemorrhage mass lesion,  midline shift, hydrocephalus, or extra-axial fluid collection. Cerebral atrophy. A small infarct in the right occipital lobe demonstrates mild enhancement. Also, linear enhancement in the left cerebellum in a region of mild edema, probably subacute infarct. Otherwise, no abnormal enhancement on motion limited evaluation.   Vascular: Detailed below.   Skull and upper cervical spine: Normal marrow signal. Degenerative pannus at the craniocervical junction.   Sinuses/Orbits: Mild paranasal sinus mucosal thickening with inferior maxillary sinus retention cyst.   MRA HEAD FINDINGS   Anterior Circulation: Bilateral intracranial ICAs, MCAs, and ACAs are patent without proximal hemodynamically significant stenosis. No aneurysm identified.   Posterior circulation the visualized intradural vertebral arteries basilar artery, and posterior cerebral arteries are patent without proximal hemodynamically significant stenosis. No aneurysm identified.   MRA NECK FINDINGS   Motion limited.   Aorta: Great vessel origins are incompletely imaged.   Carotid system: Bilateral common carotid internal carotid arteries are patent. Atherosclerosis at the left carotid bifurcation without greater than 50% stenosis relative to the distal vessel.   Vertebral arteries: Left dominant. No evidence of significant (greater than 50%) stenosis   IMPRESSION: MRI:   1. Acute, confluent infarct in the left occipital lobe (left  PCA territory). Multiple additional small infarcts in the right occipital lobe, bilateral frontal white matter, right temporal lobe, and right cerebellum, as detailed above. Given involvement of multiple vascular territories, consider embolic etiology. 2. Mild cortical right occipital and left cerebellar enhancement, probably related to subacute infarcts given the above findings and appearence.   MRA head:   No large vessel occlusion or proximal hemodynamically  significant stenosis.   MRA neck:   Motion limited without evidence of significant (greater than 50%) stenosis     Electronically Signed   By: Margaretha Sheffield M.D.   On: 03/17/2021 18:38 VAS [place] TRANSCRANIAL DOPPLER W BUBBLES  Transcranial Doppler with Bubble  Patient Name:  Arthur [name] [facility]  Date of Exam:   03/19/2021 Medical Rec #: [id number]          Accession #:    [id number] Date of Birth: 22-Jul-1940           Patient Gender: M Patient Age:   45 years Exam Location:  [facility] Procedure:      VAS [place] TRANSCRANIAL DOPPLER W BUBBLES Referring Phys: Cornelius Moras XU  --------------------------------------------------------------------------------   Indications: Stroke. History: History of DVT, positive lower extremity venous study on 03-18-2021. Comparison Study: 03-18-2021 Echo w/ bubble study was negative for evidence of                   atrial level shunt.  Performing Technologist: Darlin Coco RDMS, RVT    Examination Guidelines: A complete evaluation includes B-mode imaging, spectral Doppler, color Doppler, and power Doppler as needed of all accessible portions of each vessel. Bilateral testing is considered an integral part of a complete examination. Limited examinations for reoccurring indications may be performed as noted.     Summary:    A vascular evaluation was performed. The left middle cerebral artery was studied. An IV was inserted into the patient's right forearm. Verbal informed consent was obtained.   Less than 10 latent high intensity transient signals (HITS) were observed at rest and with valsalva maneuver, indicating a possible intrapulmonary shunt. Today's examination was limited due to suboptimal valsalva maneuver. *See table(s) above for TCD measurements and observations.      Preliminary   VAS [place] LOWER EXTREMITY VENOUS (DVT)  Lower Venous DVT Study  Patient Name:  Arthur [name] [facility]  Date of Exam:   03/18/2021 Medical Rec #: [id number]           Accession #:    [id number] Date of Birth: 11-27-1940           Patient Gender: M Patient Age:   57 years Exam Location:  [facility] Procedure:      VAS [place] LOWER EXTREMITY VENOUS (DVT) Referring Phys: Cornelius Moras XU  --------------------------------------------------------------------------------   Indications: Stroke, and pulmonary embolism.   Risk Factors: DVT 2017 Probable new lung cancer, awaiting biopsy. Comparison Study: Prior bilateral LEV done 05/19/15 with DVT noted in the right                   PT and Soleal veins  Performing Technologist: Sharion Dove RVS    Examination Guidelines: A complete evaluation includes B-mode imaging, spectral Doppler, color Doppler, and power Doppler as needed of all accessible portions of each vessel. Bilateral testing is considered an integral part of a complete examination. Limited examinations for reoccurring indications may be performed as noted. The reflux portion of the exam is performed with the patient in reverse Trendelenburg.     +---------+---------------+---------+-----------+----------+--------------+  RIGHT     Compressibility Phasicity Spontaneity Properties Thrombus Aging  +---------+---------------+---------+-----------+----------+--------------+  CFV       Full            Yes       Yes                                    +---------+---------------+---------+-----------+----------+--------------+  SFJ       Full                                                             +---------+---------------+---------+-----------+----------+--------------+  FV Prox   Full                                                             +---------+---------------+---------+-----------+----------+--------------+  FV Mid    Full                                                             +---------+---------------+---------+-----------+----------+--------------+  FV Distal Full                                                              +---------+---------------+---------+-----------+----------+--------------+  PFV       Full                                                             +---------+---------------+---------+-----------+----------+--------------+  POP       Partial         No        No                     Acute           +---------+---------------+---------+-----------+----------+--------------+  PTV       None                                             Acute           +---------+---------------+---------+-----------+----------+--------------+  PERO      None                                             Acute           +---------+---------------+---------+-----------+----------+--------------+  Soleal    None                                             Acute           +---------+---------------+---------+-----------+----------+--------------+        +---------+---------------+---------+-----------+----------+--------------+  LEFT      Compressibility Phasicity Spontaneity Properties Thrombus Aging  +---------+---------------+---------+-----------+----------+--------------+  CFV       Full            Yes       Yes                                    +---------+---------------+---------+-----------+----------+--------------+  SFJ       Full                                                             +---------+---------------+---------+-----------+----------+--------------+  FV Prox   Full                                                             +---------+---------------+---------+-----------+----------+--------------+  FV Mid    Full                                                             +---------+---------------+---------+-----------+----------+--------------+  FV Distal Full                                                             +---------+---------------+---------+-----------+----------+--------------+  PFV       Full                                                              +---------+---------------+---------+-----------+----------+--------------+  POP       Partial         No        No                     Acute           +---------+---------------+---------+-----------+----------+--------------+  PTV       None  Acute           +---------+---------------+---------+-----------+----------+--------------+  PERO      None                                             Acute           +---------+---------------+---------+-----------+----------+--------------+  Soleal    None                                             Acute           +---------+---------------+---------+-----------+----------+--------------+             Summary: RIGHT: - Findings consistent with acute deep vein thrombosis involving the right popliteal vein, right posterior tibial veins, right peroneal veins, and right soleal veins.   LEFT: - Findings consistent with acute deep vein thrombosis involving the left posterior tibial veins, left popliteal vein, left peroneal veins, and left soleal veins.    *See table(s) above for measurements and observations.  Electronically signed by Monica Martinez MD on 03/19/2021 at 2:32:43 PM.      Final      Results/Tests Pending at Time of Discharge: Lung biopsy cytology, pathology, and microbiology  Discharge Medications:  Allergies as of 03/22/2021       Reactions   Ace Inhibitors    REACTION: Cough        Medication List     STOP taking these medications    apixaban 5 MG Tabs tablet Commonly known as: ELIQUIS   hydrochlorothiazide 12.5 MG tablet Commonly known as: HYDRODIURIL       TAKE these medications    acetaminophen 650 MG CR tablet Commonly known as: TYLENOL Take 650 mg by mouth every 8 (eight) hours as needed for pain.   albuterol 108 (90 Base) MCG/ACT inhaler Commonly known as: ProAir HFA Inhale 2 puffs into the lungs every 4 (four) hours as needed for wheezing or  shortness of breath.   allopurinol 100 MG tablet Commonly known as: ZYLOPRIM Take 1 tablet (100 mg total) by mouth daily.   atenolol 25 MG tablet Commonly known as: TENORMIN Take 1 tablet (25 mg total) by mouth daily. Start taking on: March 23, 2021 What changed:  medication strength how much to take when to take this   benzonatate 100 MG capsule Commonly known as: TESSALON TAKE 1 CAPSULE BY MOUTH TWICE DAILY AS NEEDED FOR COUGH What changed: See the new instructions.   Centrum Silver 50+Men Tabs Take 1 tablet by mouth daily.   clopidogrel 75 MG tablet Commonly known as: PLAVIX Take 1 tablet (75 mg total) by mouth daily. Start taking on: March 23, 2021 What changed: when to take this   colchicine 0.6 MG tablet Take 1/2 tablet (0.3 mg total) by mouth daily. What changed: how much to take   diclofenac Sodium 1 % Gel Commonly known as: Voltaren Apply 2 g topically 4 (four) times daily. What changed:  how much to take when to take this reasons to take this   empagliflozin 10 MG Tabs tablet Commonly known as: JARDIANCE Take 1 tablet (10 mg total) by mouth daily. Start taking on: March 23, 2021   enoxaparin 80 MG/0.8ML injection Commonly known as: LOVENOX Inject 1 syringe (0.8 mLs /  80 mg total) into the skin every 12 (twelve) hours.   ferrous sulfate 324 (65 Fe) MG Tbec Take 324 mg by mouth daily.   losartan 25 MG tablet Commonly known as: COZAAR Take 1 tablet (25 mg total) by mouth daily. Start taking on: March 23, 2021 What changed:  medication strength how much to take when to take this   metFORMIN 1000 MG tablet Commonly known as: GLUCOPHAGE Take 1 tablet (1,000 mg total) by mouth 2 (two) times daily with a meal.   rosuvastatin 40 MG tablet Commonly known as: CRESTOR Take 1 tablet (40 mg total) by mouth daily. Start taking on: March 23, 2021 What changed:  medication strength how much to take   trolamine salicylate 10 %  cream Commonly known as: Asper-Flex Apply 1 application topically as needed for muscle pain.        Discharge Instructions: Please refer to Patient Instructions section of EMR for full details.  Patient was counseled important signs and symptoms that should prompt return to medical care, changes in medications, dietary instructions, activity restrictions, and follow up appointments.   Follow-Up Appointments:  Follow-up Information     Garvin Fila, MD. Schedule an appointment as soon as possible for a visit in 1 month(s).   Specialties: Neurology, Radiology Why: stroke clinic Contact information: [address] [place] [address] [phone]         June Leap L, DO. Go on 03/30/2021.   Specialty: Pulmonary Disease Why: You have an appointment at 10:30am with Eric Form, NP Contact information: [place] [address] [phone]                 Eppie Gibson, MD 03/22/2021, 12:00 PM PGY-1, [place]

## 2021-03-22 NOTE — Progress Notes (Signed)
ANTICOAGULATION CONSULT NOTE - Initial Consult  Pharmacy Consult for Lovenox Indication:  history of pulmonary embolus  and Acute DVTs  Allergies  Allergen Reactions   Ace Inhibitors     REACTION: Cough    Patient Measurements: Height: 5\' 11"  (180.3 cm) Weight: 70.8 kg (156 lb) IBW/kg (Calculated) : 75.3 kg Heparin Dosing Weight: 69.5 kg  Vital Signs: Temp: 97 F (36.1 C) (02/23 0800) Temp Source: Oral (02/23 0800) BP: 127/63 (02/23 0800) Pulse Rate: 73 (02/23 0800)  Labs: Recent Labs    03/20/21 0223 03/20/21 1237 03/20/21 2226 03/21/21 0159 03/22/21 0444  HGB 10.6*  --   --  10.6* 10.3*  HCT 33.6*  --   --  33.9* 32.0*  PLT 285  --   --  260 248  APTT 122* 47* 65* 62*  --   HEPARINUNFRC 0.88*  --   --  0.22*  --   CREATININE 1.33*  --   --  1.36* 1.56*     Estimated Creatinine Clearance: 37.8 mL/min (A) (by C-G formula based on SCr of 1.56 mg/dL (H)).   Medical History: Past Medical History:  Diagnosis Date   Diabetes mellitus without complication ([place])    High cholesterol    Hypertension    Normal nuclear stress test 2010   stress perfusion study apparently in 2010 in [facility] which he said was negative.    Assessment: 81 yo male presented for planned lung biopsy on 03/21/21 due to suspicious pulmonary nodules on PET. PTA the patient is on apixaban for history of pulmonary embolus in 01/2021. The patient has had acute DVTs and embolic stroke on Eliquis.The patient underwent bronchoscopy with biopsy on 03/22/21 and pharmacy is consulted for lovenox.   The patient's RN contacted me relaying that the patient has had difficulty adjusting the Lovenox syringe to 70 mg. After speaking tot he FM team, the dose will be changed to 80 mg BID. The patient has high clot burden and inability to draw the correct dose may lead to lower than required dosing. The patient has been educated on Lovenox administration and has given himself 2 doses thus far.  Goal of  Therapy:  Heparin level 0.3-0.7  INR 2.5-3.5  Monitor platelets by anticoagulation protocol: Yes   Plan:  Change lovenox to 80 mg every 12 hours  Monitor for signs and symptoms of bleeding  Thank you for allowing pharmacy to participate in this patient's care.  Reatha Harps, PharmD PGY1 Pharmacy Resident 03/22/2021 11:06 AM Check [email or web address] for unit specific pharmacy number

## 2021-03-22 NOTE — Progress Notes (Signed)
Patient complaining of new onset consecutive hiccups. MD informed prior to discharge. Discharge okay'd. Patient discharged

## 2021-03-22 NOTE — Progress Notes (Signed)
Discharge instructions reviewed with patient and son. Both verbalize understanding. Lovenox kit provided to patient to take home. Medications delivered to the room. Medication list and times reviewed. Patient verbalizes understanding. Ivs removed. Patient discharged

## 2021-03-23 ENCOUNTER — Other Ambulatory Visit (HOSPITAL_COMMUNITY): Payer: Self-pay

## 2021-03-23 ENCOUNTER — Telehealth (HOSPITAL_COMMUNITY): Payer: Self-pay

## 2021-03-23 LAB — CYTOLOGY - NON PAP

## 2021-03-23 NOTE — Telephone Encounter (Signed)
Transitions of Care Pharmacy  ° °Call attempted for a pharmacy transitions of care follow-up. HIPAA appropriate voicemail was left with call back information provided.  ° °Call attempt #1. Will follow-up in 2-3 days.  °  °

## 2021-03-26 ENCOUNTER — Telehealth (HOSPITAL_COMMUNITY): Payer: Self-pay

## 2021-03-26 ENCOUNTER — Other Ambulatory Visit (HOSPITAL_COMMUNITY): Payer: Self-pay

## 2021-03-26 NOTE — Telephone Encounter (Signed)
Pharmacy Transitions of Care Follow-up Telephone Call  Date of discharge: 03/22/21  Discharge Diagnosis: Pulmonary nodules  How have you been since you were released from the hospital? Patient doing well, spoke with daughter over the phone. No questions about meds at this time.  Medication changes made at discharge:     START taking: enoxaparin (LOVENOX)  Jardiance (empagliflozin)  CHANGE how you take: atenolol (TENORMIN)  clopidogrel (PLAVIX)  colchicine  losartan (COZAAR)  rosuvastatin (CRESTOR)  STOP taking: apixaban 5 MG Tabs tablet (ELIQUIS)  hydrochlorothiazide 12.5 MG tablet (HYDRODIURIL)   Medication changes verified by the patient? Yes    Medication Accessibility:  Home Pharmacy: [facility] [place]   Was the patient provided with refills on discharged medications? Yes   Have all prescriptions been transferred from [facility] to home pharmacy? Yes   Is the patient able to afford medications? Has insurance    Medication Review:  CLOPIDOGREL (PLAVIX) Clopidogrel 75 mg once daily.  - Educated patient on expected duration of therapy of ASA with clopidogrel.  - Advised patient of medications to avoid (NSAIDs, ASA)  - Educated that Tylenol (acetaminophen) will be the preferred analgesic to prevent risk of bleeding  - Emphasized importance of monitoring for signs and symptoms of bleeding (abnormal bruising, prolonged bleeding, nose bleeds, bleeding from gums, discolored urine, black tarry stools)  - Advised patient to alert all providers of anticoagulation therapy prior to starting a new medication or having a procedure   ENOXAPARIN - Please verify dosing and anticipated length of therapy. Read discharge note to confirm date of completion and verify this with the patien  Follow-up Appointments:  PCP Hospital f/u appt confirmed? Scheduled to see Dr. Majorie Santee Hearing on 03/27/21 @ 10:10.   [facility] f/u appt confirmed? Scheduled to see Dr. Elie Confer on 03/30/21  @ 10:30am.   If their condition worsens, is the pt aware to call PCP or go to the Emergency Dept.? Yes  Final Patient Assessment: Patient has f/u scheduled and refills at home pharmacy

## 2021-03-27 ENCOUNTER — Encounter: Payer: Self-pay | Admitting: Family Medicine

## 2021-03-27 ENCOUNTER — Other Ambulatory Visit (HOSPITAL_COMMUNITY): Payer: Medicare Other

## 2021-03-27 ENCOUNTER — Ambulatory Visit (INDEPENDENT_AMBULATORY_CARE_PROVIDER_SITE_OTHER): Payer: Medicare Other | Admitting: Family Medicine

## 2021-03-27 ENCOUNTER — Other Ambulatory Visit: Payer: Self-pay

## 2021-03-27 DIAGNOSIS — E119 Type 2 diabetes mellitus without complications: Secondary | ICD-10-CM | POA: Diagnosis not present

## 2021-03-27 DIAGNOSIS — I2699 Other pulmonary embolism without acute cor pulmonale: Secondary | ICD-10-CM

## 2021-03-27 DIAGNOSIS — C3490 Malignant neoplasm of unspecified part of unspecified bronchus or lung: Secondary | ICD-10-CM | POA: Diagnosis not present

## 2021-03-27 DIAGNOSIS — M1A0310 Idiopathic chronic gout, right wrist, without tophus (tophi): Secondary | ICD-10-CM | POA: Diagnosis not present

## 2021-03-27 DIAGNOSIS — R918 Other nonspecific abnormal finding of lung field: Secondary | ICD-10-CM

## 2021-03-27 DIAGNOSIS — K219 Gastro-esophageal reflux disease without esophagitis: Secondary | ICD-10-CM | POA: Diagnosis not present

## 2021-03-27 DIAGNOSIS — Z955 Presence of coronary angioplasty implant and graft: Secondary | ICD-10-CM | POA: Diagnosis not present

## 2021-03-27 DIAGNOSIS — D649 Anemia, unspecified: Secondary | ICD-10-CM

## 2021-03-27 DIAGNOSIS — I469 Cardiac arrest, cause unspecified: Secondary | ICD-10-CM | POA: Diagnosis not present

## 2021-03-27 DIAGNOSIS — I10 Essential (primary) hypertension: Secondary | ICD-10-CM

## 2021-03-27 DIAGNOSIS — Z7984 Long term (current) use of oral hypoglycemic drugs: Secondary | ICD-10-CM | POA: Diagnosis not present

## 2021-03-27 DIAGNOSIS — Z7901 Long term (current) use of anticoagulants: Secondary | ICD-10-CM | POA: Diagnosis not present

## 2021-03-27 DIAGNOSIS — Z7902 Long term (current) use of antithrombotics/antiplatelets: Secondary | ICD-10-CM | POA: Diagnosis not present

## 2021-03-27 DIAGNOSIS — I2510 Atherosclerotic heart disease of native coronary artery without angina pectoris: Secondary | ICD-10-CM | POA: Diagnosis not present

## 2021-03-27 NOTE — Assessment & Plan Note (Signed)
Stable.  Continue lovenox until meets with oncology

## 2021-03-27 NOTE — Progress Notes (Signed)
Mr Wolf Adenocarcinoma is a clinically significant diagnosis

## 2021-03-27 NOTE — Assessment & Plan Note (Signed)
>>  ASSESSMENT AND PLAN FOR PULMONARY NODULES/LESIONS, MULTIPLE WRITTEN ON 03/27/2021 11:45 AM BY Roniqua Kintz L, MD  Has follow up with pulmonary on 3/3.  Prelim path shows adenocarcinoma.  Will defer referral to oncology until hear pulmonary recommendations

## 2021-03-27 NOTE — Assessment & Plan Note (Signed)
Controlled today on current medications (which are reduced compared to prior dosages) Check bmet

## 2021-03-27 NOTE — Progress Notes (Signed)
° ° °  SUBJECTIVE:   CHIEF COMPLAINT / HPI:   Lung Lesions Pathology just came back as adenocarcinoma.  He feels overall pretty well.  No shortness of breath or cough or hemoptysis or edema  PEs  Taking lovenox at 9 AM and PM regularly.  No shortness of breath or chest pain  CAD Brings in all his medications.  No chest pain  Gout Minimal foot pain.  No redness.  Taking allopurinol and 0.3 mg colchicine daily  PERTINENT  PMH / PSH: recent discharge summary reviewed  OBJECTIVE:   BP 139/89    Pulse 78    Ht 5\' 11"  (1.803 m)    Wt 152 lb (68.9 kg)    SpO2 100%    BMI 21.20 kg/m   Alert nad Heart - Regular rate and rhythm.  No murmurs, gallops or rubs.    Lungs:  Normal respiratory effort, chest expands symmetrically. Lungs are clear to auscultation, no crackles or wheezes. Extremities:  No cyanosis, edema, or deformity noted with good range of motion of all major joints.     ASSESSMENT/PLAN:   Pulmonary embolus (HCC) Stable.  Continue lovenox until meets with oncology   Gout Stable.  Continue current medications   Essential hypertension, benign Controlled today on current medications (which are reduced compared to prior dosages) Check bmet   Pulmonary nodules/lesions, multiple Has follow up with pulmonary on 3/3.  Prelim path shows adenocarcinoma.  Will defer referral to oncology until hear pulmonary recommendations      Lind Covert, MD [place]

## 2021-03-27 NOTE — Assessment & Plan Note (Signed)
Has follow up with pulmonary on 3/3.  Prelim path shows adenocarcinoma.  Will defer referral to oncology until hear pulmonary recommendations

## 2021-03-27 NOTE — Assessment & Plan Note (Signed)
Stable.  Continue current medications.

## 2021-03-27 NOTE — Patient Instructions (Signed)
Good to see you today - Thank you for coming in  Things we discussed today:  The biopsy shows initially adenocarcinoma.   I will be in touch about an appointment with oncology  Follow up with Dr Valeta Harms on the 3/3  I will call you if your tests are not good.  Otherwise, I will send you a message on MyChart (if it is active) or a letter in the mail..  If you do not hear from me with in 2 weeks please call our office.     Keep active.  Let me know if any shortness of breath or chest pain or leg swelling or bledding   Please always bring your medication bottles  I will be in touch about the next appointment

## 2021-03-28 LAB — BASIC METABOLIC PANEL
BUN/Creatinine Ratio: 14 (ref 10–24)
BUN: 20 mg/dL (ref 8–27)
CO2: 17 mmol/L — ABNORMAL LOW (ref 20–29)
Calcium: 9.1 mg/dL (ref 8.6–10.2)
Chloride: 107 mmol/L — ABNORMAL HIGH (ref 96–106)
Creatinine, Ser: 1.42 mg/dL — ABNORMAL HIGH (ref 0.76–1.27)
Glucose: 127 mg/dL — ABNORMAL HIGH (ref 70–99)
Potassium: 4.6 mmol/L (ref 3.5–5.2)
Sodium: 141 mmol/L (ref 134–144)
eGFR: 50 mL/min/{1.73_m2} — ABNORMAL LOW (ref >59)

## 2021-03-28 LAB — CBC
Hematocrit: 35.8 % — ABNORMAL LOW (ref 37.5–51.0)
Hemoglobin: 11.4 g/dL — ABNORMAL LOW (ref 13.0–17.7)
MCH: 25.9 pg — ABNORMAL LOW (ref 26.6–33.0)
MCHC: 31.8 g/dL (ref 31.5–35.7)
MCV: 81 fL (ref 79–97)
Platelets: 296 10*3/uL (ref 150–450)
RBC: 4.41 x10E6/uL (ref 4.14–5.80)
RDW: 13.4 % (ref 11.6–15.4)
WBC: 15.8 10*3/uL — ABNORMAL HIGH (ref 3.4–10.8)

## 2021-03-30 ENCOUNTER — Ambulatory Visit (INDEPENDENT_AMBULATORY_CARE_PROVIDER_SITE_OTHER): Payer: Medicare Other | Admitting: Acute Care

## 2021-03-30 ENCOUNTER — Encounter: Payer: Self-pay | Admitting: Acute Care

## 2021-03-30 ENCOUNTER — Other Ambulatory Visit: Payer: Self-pay

## 2021-03-30 VITALS — BP 122/68 | HR 91 | Ht 71.0 in | Wt 152.3 lb

## 2021-03-30 DIAGNOSIS — C3490 Malignant neoplasm of unspecified part of unspecified bronchus or lung: Secondary | ICD-10-CM

## 2021-03-30 DIAGNOSIS — I2699 Other pulmonary embolism without acute cor pulmonale: Secondary | ICD-10-CM | POA: Diagnosis not present

## 2021-03-30 DIAGNOSIS — I6310 Cerebral infarction due to embolism of unspecified precerebral artery: Secondary | ICD-10-CM

## 2021-03-30 DIAGNOSIS — I469 Cardiac arrest, cause unspecified: Secondary | ICD-10-CM

## 2021-03-30 NOTE — Progress Notes (Signed)
History of Present Illness Arthur [name] is a 81 y.o. male never smoker with PMH of PET avid lung mass, and mediastinal adenopathy noted on CTA that was positive for , PE ( 02/03/2021), During ED admission for PE, he had a cardiac arrest 02/03/2021, Caddiac cath revealed 80% stenosis of the mid LAD he underwent PCI with DES.Marland Kitchen  Also had balloon angioplasty to a separate lesion. He was started on anticoagulation ( Eliquis) and developed stroke like symptoms, MRI confirmed acute embolic left occipital lobe confluent infarct .He underwent Flexible video fiberoptic bronchoscopy with endobronchial ultrasound and biopsies on 03/21/2021 per Dr. Valeta Harms to better evaluate the lung mss.    03/30/2021 Pt.presents for follow up. He and his son were both aware that his biopsy was + for adenocarcinoma. We discussed that the patient did have hemoptysis for several days after the bronchoscopic procedure.  This is subsequently cleared.  The patient does remain on Lovenox 80 mg daily, and Plavix 75 mg daily.  Both the patient and his son are concerned that the patient was diagnosed with cancer yet he is a non-smoker.  We discussed that it is not unheard of for non-smokers to be diagnosed with cancer.  But we also discussed that it is important to do additional testing  to determine if there are any genetic variations that may be the cause of the cancer.  I explained that these genetic variations oftentimes require different treatments, and are often very successful. I have made a referral to medical oncology, as an urgent request, so that we can get the patient into see Dr. Earlie Server as soon as possible.  I am unsure if guardian blood test was done at the time of the procedure however medical oncology will assess this and ensure all additional testing is done.  Both the patient and son are in agreement with this plan.  From a pulmonary perspective patient is stable, no further hemoptysis, no fever, no worsening dyspnea.   Patient remains thin and is still recovering from his hospital admission and other health issues in January.  Test Results: Cytology 03/21/2021 CYTOLOGY - NON PAP  CASE: [id number]  PATIENT: [facility]   FINAL MICROSCOPIC DIAGNOSIS:  A. LYMPH NODE, STATION 7, FINE NEEDLE ASPIRATION  - Adenocarcinoma.  See comment.   MR head 03/17/2021 1. Acute, confluent infarct in the left occipital lobe (left PCA territory). Multiple additional small infarcts in the right occipital lobe, bilateral frontal white matter, right temporal lobe, and right cerebellum, as detailed above. Given involvement of multiple vascular territories, consider embolic etiology. 2. Mild cortical right occipital and left cerebellar enhancement, probably related to subacute infarcts given the above findings and appearence.  MRA neck: 03/17/2021   Motion limited without evidence of significant (greater than 50%) stenosis    CBC Latest Ref Rng & Units 03/27/2021 03/22/2021 03/21/2021  WBC 3.4 - 10.8 x10E3/uL 15.8(H) 12.5(H) 13.7(H)  Hemoglobin 13.0 - 17.7 g/dL 11.4(L) 10.3(L) 10.6(L)  Hematocrit 37.5 - 51.0 % 35.8(L) 32.0(L) 33.9(L)  Platelets 150 - 450 x10E3/uL 296 248 260    BMP Latest Ref Rng & Units 03/27/2021 03/22/2021 03/21/2021  Glucose 70 - 99 mg/dL 127(H) 118(H) 84  BUN 8 - 27 mg/dL 20 24(H) 21  Creatinine 0.76 - 1.27 mg/dL 1.42(H) 1.56(H) 1.36(H)  BUN/Creat Ratio 10 - 24 14 - -  Sodium 134 - 144 mmol/L 141 135 134(L)  Potassium 3.5 - 5.2 mmol/L 4.6 5.6(H) 4.9  Chloride 96 - 106 mmol/L 107(H) 105 103  CO2 20 - 29 mmol/L 17(L) 20(L) 18(L)  Calcium 8.6 - 10.2 mg/dL 9.1 8.8(L) 9.0    BNP    Component Value Date/Time   BNP 530.8 (H) 02/03/2021 1643    ProBNP No results found for: PROBNP  PFT No results found for: FEV1PRE, FEV1POST, FVCPRE, FVCPOST, TLC, DLCOUNC, PREFEV1FVCRT, PSTFEV1FVCRT  MR ANGIO HEAD WO CONTRAST  Result Date: 03/17/2021 CLINICAL DATA:  Transient ischemic attack (TIA).  Stroke, follow up. Neuro deficit, acute, stroke. suspected R hemianopsia. EXAM: MRI HEAD WITHOUT AND WITH CONTRAST MRA HEAD WITHOUT CONTRAST MRA NECK WITHOUT AND WITH CONTRAST TECHNIQUE: Multiplanar, multiecho pulse sequences of the brain and surrounding structures were obtained without and with intravenous contrast. Angiographic images of the Circle of Willis were obtained using MRA technique without intravenous contrast. Angiographic images of the neck were obtained using MRA technique without and with intravenous contrast. Carotid stenosis measurements (when applicable) are obtained utilizing NASCET criteria, using the distal internal carotid diameter as the denominator. CONTRAST:  4mL GADAVIST GADOBUTROL 1 MMOL/ML IV SOLN COMPARISON:  None. FINDINGS: MRI HEAD FINDINGS Brain: Acute infarcts within the left greater than right occipital lobes, confluent in left. Additional punctate infarct in the right temporal lobe and multiple punctate infarcts in bilateral frontal white matter. Single punctate infarct in the right cerebellum. These infarcts are associated with edema, greatest in the left occipital lobe. No significant mass effect. No evidence of acute hemorrhage mass lesion, midline shift, hydrocephalus, or extra-axial fluid collection. Cerebral atrophy. A small infarct in the right occipital lobe demonstrates mild enhancement. Also, linear enhancement in the left cerebellum in a region of mild edema, probably subacute infarct. Otherwise, no abnormal enhancement on motion limited evaluation. Vascular: Detailed below. Skull and upper cervical spine: Normal marrow signal. Degenerative pannus at the craniocervical junction. Sinuses/Orbits: Mild paranasal sinus mucosal thickening with inferior maxillary sinus retention cyst. MRA HEAD FINDINGS Anterior Circulation: Bilateral intracranial ICAs, MCAs, and ACAs are patent without proximal hemodynamically significant stenosis. No aneurysm identified. Posterior  circulation the visualized intradural vertebral arteries basilar artery, and posterior cerebral arteries are patent without proximal hemodynamically significant stenosis. No aneurysm identified. MRA NECK FINDINGS Motion limited. Aorta: Great vessel origins are incompletely imaged. Carotid system: Bilateral common carotid internal carotid arteries are patent. Atherosclerosis at the left carotid bifurcation without greater than 50% stenosis relative to the distal vessel. Vertebral arteries: Left dominant. No evidence of significant (greater than 50%) stenosis IMPRESSION: MRI: 1. Acute, confluent infarct in the left occipital lobe (left PCA territory). Multiple additional small infarcts in the right occipital lobe, bilateral frontal white matter, right temporal lobe, and right cerebellum, as detailed above. Given involvement of multiple vascular territories, consider embolic etiology. 2. Mild cortical right occipital and left cerebellar enhancement, probably related to subacute infarcts given the above findings and appearence. MRA head: No large vessel occlusion or proximal hemodynamically significant stenosis. MRA neck: Motion limited without evidence of significant (greater than 50%) stenosis Electronically Signed   By: Margaretha Sheffield M.D.   On: 03/17/2021 18:38   MR ANGIO NECK W WO CONTRAST  Result Date: 03/17/2021 CLINICAL DATA:  Transient ischemic attack (TIA). Stroke, follow up. Neuro deficit, acute, stroke. suspected R hemianopsia. EXAM: MRI HEAD WITHOUT AND WITH CONTRAST MRA HEAD WITHOUT CONTRAST MRA NECK WITHOUT AND WITH CONTRAST TECHNIQUE: Multiplanar, multiecho pulse sequences of the brain and surrounding structures were obtained without and with intravenous contrast. Angiographic images of the Circle of Willis were obtained using MRA technique without intravenous contrast. Angiographic images of the neck  were obtained using MRA technique without and with intravenous contrast. Carotid stenosis  measurements (when applicable) are obtained utilizing NASCET criteria, using the distal internal carotid diameter as the denominator. CONTRAST:  50mL GADAVIST GADOBUTROL 1 MMOL/ML IV SOLN COMPARISON:  None. FINDINGS: MRI HEAD FINDINGS Brain: Acute infarcts within the left greater than right occipital lobes, confluent in left. Additional punctate infarct in the right temporal lobe and multiple punctate infarcts in bilateral frontal white matter. Single punctate infarct in the right cerebellum. These infarcts are associated with edema, greatest in the left occipital lobe. No significant mass effect. No evidence of acute hemorrhage mass lesion, midline shift, hydrocephalus, or extra-axial fluid collection. Cerebral atrophy. A small infarct in the right occipital lobe demonstrates mild enhancement. Also, linear enhancement in the left cerebellum in a region of mild edema, probably subacute infarct. Otherwise, no abnormal enhancement on motion limited evaluation. Vascular: Detailed below. Skull and upper cervical spine: Normal marrow signal. Degenerative pannus at the craniocervical junction. Sinuses/Orbits: Mild paranasal sinus mucosal thickening with inferior maxillary sinus retention cyst. MRA HEAD FINDINGS Anterior Circulation: Bilateral intracranial ICAs, MCAs, and ACAs are patent without proximal hemodynamically significant stenosis. No aneurysm identified. Posterior circulation the visualized intradural vertebral arteries basilar artery, and posterior cerebral arteries are patent without proximal hemodynamically significant stenosis. No aneurysm identified. MRA NECK FINDINGS Motion limited. Aorta: Great vessel origins are incompletely imaged. Carotid system: Bilateral common carotid internal carotid arteries are patent. Atherosclerosis at the left carotid bifurcation without greater than 50% stenosis relative to the distal vessel. Vertebral arteries: Left dominant. No evidence of significant (greater than 50%)  stenosis IMPRESSION: MRI: 1. Acute, confluent infarct in the left occipital lobe (left PCA territory). Multiple additional small infarcts in the right occipital lobe, bilateral frontal white matter, right temporal lobe, and right cerebellum, as detailed above. Given involvement of multiple vascular territories, consider embolic etiology. 2. Mild cortical right occipital and left cerebellar enhancement, probably related to subacute infarcts given the above findings and appearence. MRA head: No large vessel occlusion or proximal hemodynamically significant stenosis. MRA neck: Motion limited without evidence of significant (greater than 50%) stenosis Electronically Signed   By: Margaretha Sheffield M.D.   On: 03/17/2021 18:38   MR BRAIN W WO CONTRAST  Result Date: 03/17/2021 CLINICAL DATA:  Transient ischemic attack (TIA). Stroke, follow up. Neuro deficit, acute, stroke. suspected R hemianopsia. EXAM: MRI HEAD WITHOUT AND WITH CONTRAST MRA HEAD WITHOUT CONTRAST MRA NECK WITHOUT AND WITH CONTRAST TECHNIQUE: Multiplanar, multiecho pulse sequences of the brain and surrounding structures were obtained without and with intravenous contrast. Angiographic images of the Circle of Willis were obtained using MRA technique without intravenous contrast. Angiographic images of the neck were obtained using MRA technique without and with intravenous contrast. Carotid stenosis measurements (when applicable) are obtained utilizing NASCET criteria, using the distal internal carotid diameter as the denominator. CONTRAST:  16mL GADAVIST GADOBUTROL 1 MMOL/ML IV SOLN COMPARISON:  None. FINDINGS: MRI HEAD FINDINGS Brain: Acute infarcts within the left greater than right occipital lobes, confluent in left. Additional punctate infarct in the right temporal lobe and multiple punctate infarcts in bilateral frontal white matter. Single punctate infarct in the right cerebellum. These infarcts are associated with edema, greatest in the left  occipital lobe. No significant mass effect. No evidence of acute hemorrhage mass lesion, midline shift, hydrocephalus, or extra-axial fluid collection. Cerebral atrophy. A small infarct in the right occipital lobe demonstrates mild enhancement. Also, linear enhancement in the left cerebellum in a region of mild edema,  probably subacute infarct. Otherwise, no abnormal enhancement on motion limited evaluation. Vascular: Detailed below. Skull and upper cervical spine: Normal marrow signal. Degenerative pannus at the craniocervical junction. Sinuses/Orbits: Mild paranasal sinus mucosal thickening with inferior maxillary sinus retention cyst. MRA HEAD FINDINGS Anterior Circulation: Bilateral intracranial ICAs, MCAs, and ACAs are patent without proximal hemodynamically significant stenosis. No aneurysm identified. Posterior circulation the visualized intradural vertebral arteries basilar artery, and posterior cerebral arteries are patent without proximal hemodynamically significant stenosis. No aneurysm identified. MRA NECK FINDINGS Motion limited. Aorta: Great vessel origins are incompletely imaged. Carotid system: Bilateral common carotid internal carotid arteries are patent. Atherosclerosis at the left carotid bifurcation without greater than 50% stenosis relative to the distal vessel. Vertebral arteries: Left dominant. No evidence of significant (greater than 50%) stenosis IMPRESSION: MRI: 1. Acute, confluent infarct in the left occipital lobe (left PCA territory). Multiple additional small infarcts in the right occipital lobe, bilateral frontal white matter, right temporal lobe, and right cerebellum, as detailed above. Given involvement of multiple vascular territories, consider embolic etiology. 2. Mild cortical right occipital and left cerebellar enhancement, probably related to subacute infarcts given the above findings and appearence. MRA head: No large vessel occlusion or proximal hemodynamically significant  stenosis. MRA neck: Motion limited without evidence of significant (greater than 50%) stenosis Electronically Signed   By: Margaretha Sheffield M.D.   On: 03/17/2021 18:38   VAS [place] TRANSCRANIAL DOPPLER W BUBBLES  Result Date: 03/22/2021  Transcranial Doppler with Bubble Patient Name:  LYFE MONGER [facility]  Date of Exam:   03/19/2021 Medical Rec #: [id number]          Accession #:    [id number] Date of Birth: 19-Jun-1940           Patient Gender: M Patient Age:   45 years Exam Location:  [facility] Procedure:      VAS [place] TRANSCRANIAL DOPPLER W BUBBLES Referring Phys: Cornelius Moras XU --------------------------------------------------------------------------------  Indications: Stroke. History: History of DVT, positive lower extremity venous study on 03-18-2021. Comparison Study: 03-18-2021 Echo w/ bubble study was negative for evidence of                   atrial level shunt. Performing Technologist: Darlin Coco RDMS, RVT  Examination Guidelines: A complete evaluation includes B-mode imaging, spectral Doppler, color Doppler, and power Doppler as needed of all accessible portions of each vessel. Bilateral testing is considered an integral part of a complete examination. Limited examinations for reoccurring indications may be performed as noted.  Summary:  A vascular evaluation was performed. The left middle cerebral artery was studied. An IV was inserted into the patient's right forearm. Verbal informed consent was obtained.  Less than 10 latent high intensity transient signals (HITS) were observed at rest and with valsalva maneuver, indicating a possible intrapulmonary shunt. Today's examination was limited due to suboptimal valsalva maneuver. Possitive TCD Bubble study indicative of a small right to left shunt *See table(s) above for TCD measurements and observations.  Diagnosing physician: Antony Contras MD Electronically signed by Antony Contras MD on 03/22/2021 at 5:30:53 PM.    Final    ECHOCARDIOGRAM COMPLETE BUBBLE  STUDY  Result Date: 03/18/2021    ECHOCARDIOGRAM REPORT   Patient Name:   DEQUON SCHNEBLY [facility] Date of Exam: 03/18/2021 Medical Rec #:  [id number]         Height:       71.0 in Accession #:    [id number]        Weight:  153.3 lb Date of Birth:  1940/05/23          BSA:          1.883 m Patient Age:    19 years          BP:           111/64 mmHg Patient Gender: M                 HR:           78 bpm. Exam Location:  Inpatient Procedure: 2D Echo, Cardiac Doppler, Color Doppler and Saline Contrast Bubble            Study Indications:    Stroke  History:        Patient has prior history of Echocardiogram examinations, most                 recent 02/04/2021. Risk Factors:Diabetes, Hypertension and                 Dyslipidemia. Hx DVT and pulmonary embolus.  Sonographer:    Clayton Lefort RDCS (AE) Referring Phys: [place]  1. Left ventricular ejection fraction, by estimation, is 55 to 60%. The left ventricle has normal function. The left ventricle demonstrates regional wall motion abnormalities (see scoring diagram/findings for description). There is severe asymmetric left ventricular hypertrophy of the basal-septal segment. Left ventricular diastolic parameters are consistent with Grade II diastolic dysfunction (pseudonormalization).  2. Right ventricular systolic function is normal. The right ventricular size is normal. Tricuspid regurgitation signal is inadequate for assessing PA pressure.  3. A small pericardial effusion is present. The pericardial effusion is anterior to the right ventricle.  4. The mitral valve is grossly normal. No evidence of mitral valve regurgitation. No evidence of mitral stenosis.  5. The aortic valve is calcified. Aortic valve regurgitation is not visualized. Aortic valve sclerosis is present, with no evidence of aortic valve stenosis.  6. Agitated saline contrast bubble study was negative, with no evidence of any interatrial shunt. Comparison(s): Slight improvementment  in LV and RV function with persistent WMA. FINDINGS  Left Ventricle: Left ventricular ejection fraction, by estimation, is 55 to 60%. The left ventricle has normal function. The left ventricle demonstrates regional wall motion abnormalities. The left ventricular internal cavity size was small. There is severe asymmetric left ventricular hypertrophy of the basal-septal segment. Left ventricular diastolic parameters are consistent with Grade II diastolic dysfunction (pseudonormalization).  LV Wall Scoring: The inferior wall and basal inferolateral segment are hypokinetic. Right Ventricle: The right ventricular size is normal. No increase in right ventricular wall thickness. Right ventricular systolic function is normal. Tricuspid regurgitation signal is inadequate for assessing PA pressure. Left Atrium: Left atrial size was normal in size. Right Atrium: Right atrial size was normal in size. Pericardium: A small pericardial effusion is present. The pericardial effusion is anterior to the right ventricle. Mitral Valve: The mitral valve is grossly normal. No evidence of mitral valve regurgitation. No evidence of mitral valve stenosis. MV peak gradient, 3.0 mmHg. The mean mitral valve gradient is 2.0 mmHg. Tricuspid Valve: The tricuspid valve is normal in structure. Tricuspid valve regurgitation is trivial. No evidence of tricuspid stenosis. Aortic Valve: The aortic valve is calcified. Aortic valve regurgitation is not visualized. Aortic valve sclerosis is present, with no evidence of aortic valve stenosis. Aortic valve mean gradient measures 5.0 mmHg. Aortic valve peak gradient measures 9.6  mmHg. Aortic valve area, by VTI measures 2.60 cm.  Pulmonic Valve: The pulmonic valve was normal in structure. Pulmonic valve regurgitation is not visualized. No evidence of pulmonic stenosis. Aorta: The aortic root and ascending aorta are structurally normal, with no evidence of dilitation. IAS/Shunts: No atrial level shunt detected  by color flow Doppler. Agitated saline contrast was given intravenously to evaluate for intracardiac shunting. Agitated saline contrast bubble study was negative, with no evidence of any interatrial shunt.  LEFT VENTRICLE PLAX 2D LVIDd:         3.10 cm   Diastology LVIDs:         2.10 cm   LV e' medial:    4.68 cm/s LV PW:         1.60 cm   LV E/e' medial:  20.3 LV IVS:        1.40 cm   LV e' lateral:   6.42 cm/s LVOT diam:     1.90 cm   LV E/e' lateral: 14.8 LV SV:         75 LV SV Index:   40 LVOT Area:     2.84 cm  RIGHT VENTRICLE             IVC RV Basal diam:  3.90 cm     IVC diam: 1.50 cm RV Mid diam:    3.30 cm RV S prime:     16.00 cm/s TAPSE (M-mode): 2.1 cm LEFT ATRIUM             Index        RIGHT ATRIUM           Index LA diam:        3.30 cm 1.75 cm/m   RA Area:     17.60 cm LA Vol (A2C):   31.1 ml 16.51 ml/m  RA Volume:   49.30 ml  26.18 ml/m LA Vol (A4C):   27.4 ml 14.55 ml/m LA Biplane Vol: 30.4 ml 16.14 ml/m  AORTIC VALVE AV Area (Vmax):    2.34 cm AV Area (Vmean):   2.48 cm AV Area (VTI):     2.60 cm AV Vmax:           155.00 cm/s AV Vmean:          101.000 cm/s AV VTI:            0.288 m AV Peak Grad:      9.6 mmHg AV Mean Grad:      5.0 mmHg LVOT Vmax:         128.00 cm/s LVOT Vmean:        88.500 cm/s LVOT VTI:          0.264 m LVOT/AV VTI ratio: 0.92  AORTA Ao Root diam: 3.50 cm Ao Asc diam:  3.20 cm MITRAL VALVE MV Area (PHT): 2.94 cm    SHUNTS MV Area VTI:   2.69 cm    Systemic VTI:  0.26 m MV Peak grad:  3.0 mmHg    Systemic Diam: 1.90 cm MV Mean grad:  2.0 mmHg MV Vmax:       0.87 m/s MV Vmean:      63.8 cm/s MV Decel Time: 258 msec MV E velocity: 95.00 cm/s MV A velocity: 79.50 cm/s MV E/A ratio:  1.19 Rudean Haskell MD Electronically signed by Rudean Haskell MD Signature Date/Time: 03/18/2021/5:24:54 PM    Final    CT HEAD CODE STROKE WO CONTRAST`  Result Date: 03/17/2021 CLINICAL DATA:  Code stroke. 81 year old male with right eye loss  of vision upon waking.  EXAM: CT HEAD WITHOUT CONTRAST TECHNIQUE: Contiguous axial images were obtained from the base of the skull through the vertex without intravenous contrast. RADIATION DOSE REDUCTION: This exam was performed according to the departmental dose-optimization program which includes automated exposure control, adjustment of the mA and/or kV according to patient size and/or use of iterative reconstruction technique. COMPARISON:  Head CT 02/04/2021. FINDINGS: Brain: Patchy cytotoxic edema in the left occipital pole on series 3 image 15. No hemorrhagic transformation or mass effect. Elsewhere Stable gray-white matter differentiation throughout the brain. No midline shift, ventriculomegaly, mass effect, evidence of mass lesion, intracranial hemorrhage or other cortically based acute infarction. Vascular: Extensive Calcified atherosclerosis at the skull base. No suspicious intracranial vascular hyperdensity. Skull: No acute osseous abnormality identified. Sinuses/Orbits: Visualized paranasal sinuses and mastoids are stable and well aerated. Other: Orbits soft tissues appears stable and negative. Visualized scalp soft tissues are within normal limits. ASPECTS [facility] Stroke Program Early CT Score) Total score (0-10 with 10 being normal): 10 (left PCA territory) IMPRESSION: 1. Cytotoxic edema in the left occipital pole compatible with acute to subacute Left PCA territory infarct. No hemorrhagic transformation or mass effect. 2. Otherwise stable since 02/04/2021. 3. These results were communicated to Dr. Quinn Axe at 11:14 am on 03/17/2021 by text page via the [facility] messaging system. Electronically Signed   By: Genevie Ann M.D.   On: 03/17/2021 11:19   VAS [place] LOWER EXTREMITY VENOUS (DVT)  Result Date: 03/19/2021  Lower Venous DVT Study Patient Name:  TERRENCE WISHON [facility]  Date of Exam:   03/18/2021 Medical Rec #: [id number]          Accession #:    [id number] Date of Birth: Nov 23, 1940           Patient Gender: M Patient Age:   57 years Exam  Location:  [facility] Procedure:      VAS [place] LOWER EXTREMITY VENOUS (DVT) Referring Phys: Cornelius Moras XU --------------------------------------------------------------------------------  Indications: Stroke, and pulmonary embolism.  Risk Factors: DVT 2017 Probable new lung cancer, awaiting biopsy. Comparison Study: Prior bilateral LEV done 05/19/15 with DVT noted in the right                   PT and Soleal veins Performing Technologist: Sharion Dove RVS  Examination Guidelines: A complete evaluation includes B-mode imaging, spectral Doppler, color Doppler, and power Doppler as needed of all accessible portions of each vessel. Bilateral testing is considered an integral part of a complete examination. Limited examinations for reoccurring indications may be performed as noted. The reflux portion of the exam is performed with the patient in reverse Trendelenburg.  +---------+---------------+---------+-----------+----------+--------------+  RIGHT     Compressibility Phasicity Spontaneity Properties Thrombus Aging  +---------+---------------+---------+-----------+----------+--------------+  CFV       Full            Yes       Yes                                    +---------+---------------+---------+-----------+----------+--------------+  SFJ       Full                                                             +---------+---------------+---------+-----------+----------+--------------+  FV Prox   Full                                                             +---------+---------------+---------+-----------+----------+--------------+  FV Mid    Full                                                             +---------+---------------+---------+-----------+----------+--------------+  FV Distal Full                                                             +---------+---------------+---------+-----------+----------+--------------+  PFV       Full                                                              +---------+---------------+---------+-----------+----------+--------------+  POP       Partial         No        No                     Acute           +---------+---------------+---------+-----------+----------+--------------+  PTV       None                                             Acute           +---------+---------------+---------+-----------+----------+--------------+  PERO      None                                             Acute           +---------+---------------+---------+-----------+----------+--------------+  Soleal    None                                             Acute           +---------+---------------+---------+-----------+----------+--------------+   +---------+---------------+---------+-----------+----------+--------------+  LEFT      Compressibility Phasicity Spontaneity Properties Thrombus Aging  +---------+---------------+---------+-----------+----------+--------------+  CFV       Full            Yes       Yes                                    +---------+---------------+---------+-----------+----------+--------------+  SFJ       Full                                                             +---------+---------------+---------+-----------+----------+--------------+  FV Prox   Full                                                             +---------+---------------+---------+-----------+----------+--------------+  FV Mid    Full                                                             +---------+---------------+---------+-----------+----------+--------------+  FV Distal Full                                                             +---------+---------------+---------+-----------+----------+--------------+  PFV       Full                                                             +---------+---------------+---------+-----------+----------+--------------+  POP       Partial         No        No                     Acute            +---------+---------------+---------+-----------+----------+--------------+  PTV       None                                             Acute           +---------+---------------+---------+-----------+----------+--------------+  PERO      None                                             Acute           +---------+---------------+---------+-----------+----------+--------------+  Soleal    None                                             Acute           +---------+---------------+---------+-----------+----------+--------------+     Summary: RIGHT: - Findings consistent with acute  deep vein thrombosis involving the right popliteal vein, right posterior tibial veins, right peroneal veins, and right soleal veins.  LEFT: - Findings consistent with acute deep vein thrombosis involving the left posterior tibial veins, left popliteal vein, left peroneal veins, and left soleal veins.  *See table(s) above for measurements and observations. Electronically signed by Monica Martinez MD on 03/19/2021 at 2:32:43 PM.    Final      Past medical hx Past Medical History:  Diagnosis Date   Diabetes mellitus without complication ([place])    High cholesterol    Hypertension    Normal nuclear stress test 2010   stress perfusion study apparently in 2010 in [facility] which he said was negative.     Social History   Tobacco Use   Smoking status: Never   Smokeless tobacco: Never  Substance Use Topics   Alcohol use: No   Drug use: No    Mr.Gallina reports that he has never smoked. He has never used smokeless tobacco. He reports that he does not drink alcohol and does not use drugs.  Tobacco Cessation: Never smoker   Past surgical hx, Family hx, Social hx all reviewed.  Current Outpatient Medications on File Prior to Visit  Medication Sig   acetaminophen (TYLENOL) 650 MG CR tablet Take 650 mg by mouth every 8 (eight) hours as needed for pain.   albuterol (PROAIR HFA) 108 (90 Base) MCG/ACT inhaler Inhale 2  puffs into the lungs every 4 (four) hours as needed for wheezing or shortness of breath.   allopurinol (ZYLOPRIM) 100 MG tablet Take 1 tablet (100 mg total) by mouth daily.   atenolol (TENORMIN) 25 MG tablet Take 1 tablet (25 mg total) by mouth daily.   clopidogrel (PLAVIX) 75 MG tablet Take 1 tablet (75 mg total) by mouth daily.   colchicine 0.6 MG tablet Take 1/2 tablet (0.3 mg total) by mouth daily.   diclofenac Sodium (VOLTAREN) 1 % GEL Apply 2 g topically 4 (four) times daily. (Patient taking differently: Apply 2-4 g topically 4 (four) times daily as needed (knees/legs).)   empagliflozin (JARDIANCE) 10 MG TABS tablet Take 1 tablet (10 mg total) by mouth daily.   enoxaparin (LOVENOX) 80 MG/0.8ML injection Inject 1 syringe (0.8 mLs /80 mg total) into the skin every 12 (twelve) hours.   ferrous sulfate 324 (65 Fe) MG TBEC Take 324 mg by mouth daily.   losartan (COZAAR) 25 MG tablet Take 1 tablet (25 mg total) by mouth daily.   metFORMIN (GLUCOPHAGE) 1000 MG tablet Take 1 tablet (1,000 mg total) by mouth 2 (two) times daily with a meal.   Multiple Vitamins-Minerals (CENTRUM SILVER 50+MEN) TABS Take 1 tablet by mouth daily.   rosuvastatin (CRESTOR) 40 MG tablet Take 1 tablet (40 mg total) by mouth daily.   trolamine salicylate (ASPER-FLEX) 10 % cream Apply 1 application topically as needed for muscle pain.   No current facility-administered medications on file prior to visit.     Allergies  Allergen Reactions   Ace Inhibitors     REACTION: Cough    Review Of Systems:  Constitutional:   No  weight loss, night sweats,  Fevers, chills, fatigue, or  lassitude.  HEENT:   No headaches,  Difficulty swallowing,  Tooth/dental problems, or  Sore throat,                No sneezing, itching, ear ache, nasal congestion, post nasal drip,   CV:  No chest pain,  Orthopnea, PND, swelling in  lower extremities, anasarca, dizziness, palpitations, syncope.   GI  No heartburn, indigestion, abdominal pain,  nausea, vomiting, diarrhea, change in bowel habits, loss of appetite, bloody stools.   Resp: No shortness of breath with exertion or at rest.  No excess mucus, no productive cough,  No non-productive cough,  No coughing up of blood.  No change in color of mucus.  No wheezing.  No chest wall deformity  Skin: no rash or lesions.  GU: no dysuria, change in color of urine, no urgency or frequency.  No flank pain, no hematuria   MS:  No joint pain or swelling.  No decreased range of motion.  No back pain.  Psych:  No change in mood or affect. No depression or anxiety.  No memory loss.   Vital Signs BP 122/68    Pulse 91    Ht 5\' 11"  (1.803 m)    Wt 152 lb 4.8 oz (69.1 kg)    SpO2 94%    BMI 21.24 kg/m    Physical Exam:  General- No distress,  A&Ox3, thin, pleasant ENT: No sinus tenderness, TM clear, pale nasal mucosa, no oral exudate,no post nasal drip, no LAN Cardiac: S1, S2, regular rate and rhythm, no murmur Chest: No wheeze/ rales/ dullness; no accessory muscle use, no nasal flaring, no sternal retractions, slightly diminished per bases Abd.: Soft Non-tender, nondistended, bowel sounds positive, Body mass index is 21.24 kg/m. Ext: No clubbing cyanosis, edema Neuro: Positive for physical deconditioning, moving all extremities x4, alert and oriented x3, Skin: No rashes, warm and dry, no lesions, warm dry and intact Psych: normal mood and behavior, appropriately concerned   Assessment/Plan Post Flexible video fiberoptic bronchoscopy with endobronchial ultrasound and biopsies on 03/21/2021 Biopsy confirmed adenocarcinoma PE January 2023 Coronary artery disease, postcardiac arrest January 2023 PCI January 2023 Plan I have referred you to medical oncology as an urgent request.  You will get a call to get scheduled with Dr. Julien Nordmann. Continue Lovenox and Plavix per cardiology Follow up with Dr. Valeta Harms as needed.  Please contact office for sooner follow up if symptoms do not improve or  worsen or seek emergency care    I spent 45 minutes dedicated to the care of this patient on the date of this encounter to include pre-visit review of records, face-to-face time with the patient discussing conditions above, post visit ordering of testing, clinical documentation with the electronic health record, making appropriate referrals as documented, and communicating necessary information to the patient's healthcare team.    Magdalen Spatz, NP 03/30/2021  10:45 AM

## 2021-03-30 NOTE — Patient Instructions (Signed)
It is good to see you today. ?I have referred you to medical oncology as an urgent request.  ?You will get a call to get scheduled with Dr. Julien Nordmann. ?Follow up with Dr. Valeta Harms as needed.  ?Please contact office for sooner follow up if symptoms do not improve or worsen or seek emergency care  ?  ?

## 2021-04-02 ENCOUNTER — Telehealth: Payer: Self-pay | Admitting: Physician Assistant

## 2021-04-02 ENCOUNTER — Encounter: Payer: Self-pay | Admitting: *Deleted

## 2021-04-02 DIAGNOSIS — R918 Other nonspecific abnormal finding of lung field: Secondary | ICD-10-CM

## 2021-04-02 NOTE — Telephone Encounter (Signed)
Scheduled appt per 3/3 referral. Pt is aware of appt date and time. Pt is aware to arrive 15 mins prior to appt time and to bring and updated insurance card. Pt is aware of appt location.   ?

## 2021-04-02 NOTE — Progress Notes (Signed)
Oncology Nurse Navigator Documentation ? ?Oncology Nurse Navigator Flowsheets 04/02/2021  ?Abnormal Finding Date 02/03/2021  ?Confirmed Diagnosis Date 03/21/2021  ?Diagnosis Status Pending Molecular Studies  ?Navigator Follow Up Date: 04/09/2021  ?Navigator Follow Up Reason: New Patient Appointment  ?Navigator Location [facility]  ?Referral Date to RadOnc/MedOnc 03/30/2021  ?Navigator Encounter Type Other:  ?Patient Visit Type Other  ?Treatment Phase Other  ?Barriers/Navigation Needs Coordination of Care/I received referral on Mr. Manuella Ghazi.  I updated new patient coordinator to call and schedule him to be seen with Cassie next week. I checked on pathology and moleculars are pending with [facility] according to their patient portal.   ?Interventions Coordination of Care  ?Acuity Level 2-Minimal Needs (1-2 Barriers Identified)  ?Coordination of Care Other;Pathology  ?Time Spent with Patient 30  ?  ?

## 2021-04-03 ENCOUNTER — Encounter: Payer: Self-pay | Admitting: *Deleted

## 2021-04-03 DIAGNOSIS — E119 Type 2 diabetes mellitus without complications: Secondary | ICD-10-CM | POA: Diagnosis not present

## 2021-04-03 DIAGNOSIS — Z7984 Long term (current) use of oral hypoglycemic drugs: Secondary | ICD-10-CM | POA: Diagnosis not present

## 2021-04-03 DIAGNOSIS — K219 Gastro-esophageal reflux disease without esophagitis: Secondary | ICD-10-CM | POA: Diagnosis not present

## 2021-04-03 DIAGNOSIS — I2510 Atherosclerotic heart disease of native coronary artery without angina pectoris: Secondary | ICD-10-CM | POA: Diagnosis not present

## 2021-04-03 DIAGNOSIS — I10 Essential (primary) hypertension: Secondary | ICD-10-CM | POA: Diagnosis not present

## 2021-04-03 DIAGNOSIS — Z7901 Long term (current) use of anticoagulants: Secondary | ICD-10-CM | POA: Diagnosis not present

## 2021-04-03 DIAGNOSIS — I469 Cardiac arrest, cause unspecified: Secondary | ICD-10-CM | POA: Diagnosis not present

## 2021-04-03 DIAGNOSIS — I2699 Other pulmonary embolism without acute cor pulmonale: Secondary | ICD-10-CM | POA: Diagnosis not present

## 2021-04-03 DIAGNOSIS — Z955 Presence of coronary angioplasty implant and graft: Secondary | ICD-10-CM | POA: Diagnosis not present

## 2021-04-03 DIAGNOSIS — Z7902 Long term (current) use of antithrombotics/antiplatelets: Secondary | ICD-10-CM | POA: Diagnosis not present

## 2021-04-03 NOTE — Progress Notes (Signed)
Oncology Nurse Navigator Documentation ? ?Oncology Nurse Navigator Flowsheets 04/03/2021 04/02/2021  ?Abnormal Finding Date - 02/03/2021  ?Confirmed Diagnosis Date - 03/21/2021  ?Diagnosis Status Confirmed Diagnosis Complete Pending Molecular Studies  ?Navigator Follow Up Date: - 04/09/2021  ?Navigator Follow Up Reason: - New Patient Appointment  ?Navigator Location [facility]  ?Referral Date to RadOnc/MedOnc - 03/30/2021  ?Navigator Encounter Type Pathology Review Other:  ?Patient Visit Type Other Other  ?Treatment Phase Other Other  ?Barriers/Navigation Needs Coordination of Care/I received a message from Dr. Julien Nordmann that patient moleculars are completed. I followed up on [facility] patient portal and noticed they are resulted out. I printed and will place on his desk.  Coordination of Care  ?Interventions Coordination of Care Coordination of Care  ?Acuity Level 2-Minimal Needs (1-2 Barriers Identified) Level 2-Minimal Needs (1-2 Barriers Identified)  ?Coordination of Care Pathology Other;Pathology  ?Time Spent with Patient 30 30  ?  ?

## 2021-04-04 ENCOUNTER — Other Ambulatory Visit: Payer: Self-pay

## 2021-04-04 MED ORDER — EMPAGLIFLOZIN 10 MG PO TABS: 10.0000 mg | ORAL_TABLET | Freq: Every day | ORAL | 0 refills | Status: DC

## 2021-04-04 MED ORDER — LOSARTAN POTASSIUM 25 MG PO TABS: 25.0000 mg | ORAL_TABLET | Freq: Every day | ORAL | 0 refills | Status: DC

## 2021-04-04 MED ORDER — ROSUVASTATIN CALCIUM 40 MG PO TABS: 40.0000 mg | ORAL_TABLET | Freq: Every day | ORAL | 0 refills | Status: DC

## 2021-04-04 MED ORDER — CLOPIDOGREL BISULFATE 75 MG PO TABS: 75.0000 mg | ORAL_TABLET | Freq: Every day | ORAL | 1 refills | Status: DC

## 2021-04-04 MED ORDER — ATENOLOL 25 MG PO TABS: 25.0000 mg | ORAL_TABLET | Freq: Every day | ORAL | 0 refills | Status: DC
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
## 2021-04-04 NOTE — Telephone Encounter (Signed)
[name] daughter calls nurse line requesting refills on the pended medications. Advised daughter that refills would be early, given patient received 30 day supply on 2/24. ? ?Daughter is requesting that refills be sent to [facility] on [address]. She will not be able to pick up until next week, she just wanted to place refill request early.  ? ?Please advise.  ? ?Talbot Grumbling, RN ? ?

## 2021-04-05 ENCOUNTER — Encounter: Payer: Self-pay | Admitting: Gastroenterology

## 2021-04-05 DIAGNOSIS — K219 Gastro-esophageal reflux disease without esophagitis: Secondary | ICD-10-CM | POA: Diagnosis not present

## 2021-04-05 DIAGNOSIS — I469 Cardiac arrest, cause unspecified: Secondary | ICD-10-CM | POA: Diagnosis not present

## 2021-04-05 DIAGNOSIS — I2699 Other pulmonary embolism without acute cor pulmonale: Secondary | ICD-10-CM | POA: Diagnosis not present

## 2021-04-05 DIAGNOSIS — Z7902 Long term (current) use of antithrombotics/antiplatelets: Secondary | ICD-10-CM | POA: Diagnosis not present

## 2021-04-05 DIAGNOSIS — I2510 Atherosclerotic heart disease of native coronary artery without angina pectoris: Secondary | ICD-10-CM | POA: Diagnosis not present

## 2021-04-05 DIAGNOSIS — Z7901 Long term (current) use of anticoagulants: Secondary | ICD-10-CM | POA: Diagnosis not present

## 2021-04-05 DIAGNOSIS — Z7984 Long term (current) use of oral hypoglycemic drugs: Secondary | ICD-10-CM | POA: Diagnosis not present

## 2021-04-05 DIAGNOSIS — Z955 Presence of coronary angioplasty implant and graft: Secondary | ICD-10-CM | POA: Diagnosis not present

## 2021-04-05 DIAGNOSIS — I10 Essential (primary) hypertension: Secondary | ICD-10-CM | POA: Diagnosis not present

## 2021-04-05 DIAGNOSIS — E119 Type 2 diabetes mellitus without complications: Secondary | ICD-10-CM | POA: Diagnosis not present

## 2021-04-06 NOTE — Progress Notes (Signed)
error 

## 2021-04-09 ENCOUNTER — Inpatient Hospital Stay (HOSPITAL_BASED_OUTPATIENT_CLINIC_OR_DEPARTMENT_OTHER): Payer: Medicare Other | Admitting: Physician Assistant

## 2021-04-09 ENCOUNTER — Telehealth: Payer: Self-pay | Admitting: Pharmacist

## 2021-04-09 ENCOUNTER — Other Ambulatory Visit (HOSPITAL_COMMUNITY): Payer: Self-pay

## 2021-04-09 ENCOUNTER — Inpatient Hospital Stay: Payer: Medicare Other | Attending: Physician Assistant

## 2021-04-09 ENCOUNTER — Other Ambulatory Visit: Payer: Self-pay

## 2021-04-09 ENCOUNTER — Other Ambulatory Visit: Payer: Self-pay | Admitting: Internal Medicine

## 2021-04-09 VITALS — BP 152/62 | HR 87 | Temp 97.7°F | Resp 18 | Ht 71.0 in | Wt 151.1 lb

## 2021-04-09 DIAGNOSIS — C3491 Malignant neoplasm of unspecified part of right bronchus or lung: Secondary | ICD-10-CM | POA: Diagnosis not present

## 2021-04-09 DIAGNOSIS — Z7984 Long term (current) use of oral hypoglycemic drugs: Secondary | ICD-10-CM | POA: Insufficient documentation

## 2021-04-09 DIAGNOSIS — C3490 Malignant neoplasm of unspecified part of unspecified bronchus or lung: Secondary | ICD-10-CM | POA: Insufficient documentation

## 2021-04-09 DIAGNOSIS — R918 Other nonspecific abnormal finding of lung field: Secondary | ICD-10-CM

## 2021-04-09 DIAGNOSIS — E119 Type 2 diabetes mellitus without complications: Secondary | ICD-10-CM | POA: Insufficient documentation

## 2021-04-09 LAB — CBC WITH DIFFERENTIAL (CANCER CENTER ONLY)
Abs Immature Granulocytes: 0.06 10*3/uL (ref 0.00–0.07)
Basophils Absolute: 0.2 10*3/uL — ABNORMAL HIGH (ref 0.0–0.1)
Basophils Relative: 1 %
Eosinophils Absolute: 3.1 10*3/uL — ABNORMAL HIGH (ref 0.0–0.5)
Eosinophils Relative: 20 %
HCT: 36.7 % — ABNORMAL LOW (ref 39.0–52.0)
Hemoglobin: 11.6 g/dL — ABNORMAL LOW (ref 13.0–17.0)
Immature Granulocytes: 0 %
Lymphocytes Relative: 16 %
Lymphs Abs: 2.4 10*3/uL (ref 0.7–4.0)
MCH: 26.1 pg (ref 26.0–34.0)
MCHC: 31.6 g/dL (ref 30.0–36.0)
MCV: 82.7 fL (ref 80.0–100.0)
Monocytes Absolute: 1.2 10*3/uL — ABNORMAL HIGH (ref 0.1–1.0)
Monocytes Relative: 8 %
Neutro Abs: 8.5 10*3/uL — ABNORMAL HIGH (ref 1.7–7.7)
Neutrophils Relative %: 55 %
Platelet Count: 263 10*3/uL (ref 150–400)
RBC: 4.44 MIL/uL (ref 4.22–5.81)
RDW: 16.4 % — ABNORMAL HIGH (ref 11.5–15.5)
Smear Review: NORMAL
WBC Count: 15.5 10*3/uL — ABNORMAL HIGH (ref 4.0–10.5)
nRBC: 0 % (ref 0.0–0.2)

## 2021-04-09 LAB — CMP (CANCER CENTER ONLY)
ALT: 13 U/L (ref 0–44)
AST: 17 U/L (ref 15–41)
Albumin: 3.4 g/dL — ABNORMAL LOW (ref 3.5–5.0)
Alkaline Phosphatase: 157 U/L — ABNORMAL HIGH (ref 38–126)
Anion gap: 8 (ref 5–15)
BUN: 18 mg/dL (ref 8–23)
CO2: 25 mmol/L (ref 22–32)
Calcium: 9.2 mg/dL (ref 8.9–10.3)
Chloride: 107 mmol/L (ref 98–111)
Creatinine: 1.18 mg/dL (ref 0.61–1.24)
GFR, Estimated: >60 mL/min (ref >60)
Glucose, Bld: 127 mg/dL — ABNORMAL HIGH (ref 70–99)
Potassium: 4.5 mmol/L (ref 3.5–5.1)
Sodium: 140 mmol/L (ref 135–145)
Total Bilirubin: 0.5 mg/dL (ref 0.3–1.2)
Total Protein: 7 g/dL (ref 6.5–8.1)

## 2021-04-09 MED ORDER — PROCHLORPERAZINE MALEATE 10 MG PO TABS: 10.0000 mg | ORAL_TABLET | Freq: Four times a day (QID) | ORAL | 2 refills | Status: DC | PRN

## 2021-04-09 MED ORDER — ALECENSA 150 MG PO CAPS
600.0000 mg | ORAL_CAPSULE | Freq: Two times a day (BID) | ORAL | 3 refills | Status: DC
  Filled 2021-04-09 – 2021-04-10 (×2): qty 240, 30d supply, fill #0
  Filled 2021-05-14 – 2021-05-15 (×2): qty 240, 30d supply, fill #1
  Filled 2021-06-13: qty 240, 30d supply, fill #2
  Filled 2021-07-09: qty 240, 30d supply, fill #3

## 2021-04-09 NOTE — Telephone Encounter (Signed)
Oral Chemotherapy Pharmacist Encounter  I met with patient  and patient's daughter for overview of: Alecensa (alectinib) for the treatment of ALK-rearrangement positive, metastatic NSCLC, planned duration until disease progression or unacceptable toxicity.  Counseled patient on administration, dosing, side effects, monitoring, drug-food interactions, safe handling, storage, and disposal.  Patient will take Alecensa $RemoveBefor'150mg'CwXaIxrEQmiF$  capsules, 4 capsules ($RemoveBef'600mg'oypEeyrRiX$ ) by mouth twice daily with food.  Alecensa start date: pending PA approval and drug acquisition - will update encounter with exact start date  Adverse effects include but are not limited to: bradycardia, muscle pain, edema, constipation, fatigue, skin photosensitivity, visual disturbances, headache, skin rash, increased blood sugar, decreased electrolytes, hepatotoxicity, and decreased blood counts.    Reviewed with patient importance of keeping a medication schedule and plan for any missed doses. No barriers to medication adherence identified.  Medication reconciliation performed and medication/allergy list updated.  All questions answered.  Arthur [name] voiced understanding and appreciation.   Medication education handout given to patient. Patient knows to call the office with questions or concerns. Oral Chemotherapy Clinic phone number provided to patient.   Arthur [name], PharmD, BCPS Hematology/Oncology Clinical Pharmacist [facility] [phone] 04/09/2021 4:15 PM

## 2021-04-09 NOTE — Telephone Encounter (Signed)
Oral Oncology Pharmacist Encounter ? ?Received new prescription for Alecensa (alectinib) for the treatment of metastatic, non-small cell lung cancer with presence of ALK rearrangement, planned duration until disease progression or unacceptable drug toxicity. ? ?CBC w/ Diff and CMP from 04/09/21 assessed, no baseline dose adjustments required. Prescription dose and frequency assessed for appropriateness. Appropriate for therapy initiation.  ? ?Current medication list in Epic reviewed, DDIs with Alecensa identified: ?Category C drug-drug interaction between Alecensa and atenolol - both agents can cause bradycardia. Noted pts HR  87 BPM on 04/09/21. No changes in therapy warranted at this time. ? ?Evaluated chart and no patient barriers to medication adherence noted.  ? ?Patient agreement for treatment documented in MD note on 04/09/21. ? ?Prescription has been e-scribed to the [facility] for benefits analysis and approval. ? ?Oral Oncology Clinic will continue to follow for insurance authorization, copayment issues, initial counseling and start date. ? ?Leron Croak, PharmD, BCPS ?Hematology/Oncology Clinical Pharmacist ?[facility] Oral Chemotherapy Navigation Clinics ?[phone] ?04/09/2021 4:11 PM ? ?

## 2021-04-09 NOTE — Patient Instructions (Addendum)
Summary:  ?-There are two main categories of lung cancer, they are named based on the size of the cancer cell. One is called Non-Small cell lung cancer. The other type is Small Cell Lung Cancer ?-The sample (biopsy) that they took of your tumor was consistent with a subtype of Non-small cell lung cancer called Adenocarcinoma. This is the most common type of lung cancer, especially for someone who has never smoked.  ?-We covered a lot of important information at your appointment today regarding what the treatment plan is moving forward. Here are the the main points that were discussed at your office visit with [place] today:  ?-The treatment that we are recommending is an oral tablet called Alecensa (more information below). We will have the pharmacist discuss this with you in more details today. You have a mutation, marker called ALK.  ?-We are planning on starting your treatment in the next few days.  ?-We will get a CT scan after 2 months of treatments to check on the progress of treatment ? ?Medications:  ?-I have sent a few important medication prescriptions to your pharmacy.  ?-Compazine was sent to your pharmacy. This medication is for nausea. You may take this every 6 hours as needed if you feel nauseous.  ?  ? ?Referrals or Imaging: ?-I can refer you to a nutritionist, otherwise, the new medication will help your appetite and weight.  ? ? ?Follow up:  ?-We will see you back for a follow up visit 2-3 week to see how treatment is going and to help manage any side effects of treatment that you may have.  ? ?-If you need to reach [place] at any time, the main office number to the cancer center is [phone], when you call, ask to speak to either Cassie's or Dr. Worthy Flank nurse.  ? ?

## 2021-04-09 NOTE — Progress Notes (Signed)
START ON PATHWAY REGIMEN - Non-Small Cell Lung     PO twice daily:     Alectinib   **Always confirm dose/schedule in your pharmacy ordering system**  Patient Characteristics: Stage IV Metastatic, Nonsquamous, Molecular Analysis Completed, Molecular Alteration Present and Eligible for Molecular Targeted Therapy, Initial Molecular Targeted Therapy, ALK Fusion/Rearrangement Positive Therapeutic Status: Stage IV Metastatic Histology: Nonsquamous Cell Broad Molecular Profiling Status: Molecular Analysis Completed Molecular Analysis Results: Alteration Present and Eligible for Molecular Targeted Therapy Molecular Alteration Present: ALK Fusion/Rearrangement Positive Molecular Targeted Line of Therapy: Initial Molecular Targeted Therapy Intent of Therapy: Non-Curative / Palliative Intent, Discussed with Patient 

## 2021-04-10 ENCOUNTER — Telehealth: Payer: Self-pay

## 2021-04-10 ENCOUNTER — Other Ambulatory Visit (HOSPITAL_COMMUNITY): Payer: Self-pay

## 2021-04-10 DIAGNOSIS — I469 Cardiac arrest, cause unspecified: Secondary | ICD-10-CM | POA: Diagnosis not present

## 2021-04-10 DIAGNOSIS — K219 Gastro-esophageal reflux disease without esophagitis: Secondary | ICD-10-CM | POA: Diagnosis not present

## 2021-04-10 DIAGNOSIS — Z955 Presence of coronary angioplasty implant and graft: Secondary | ICD-10-CM | POA: Diagnosis not present

## 2021-04-10 DIAGNOSIS — Z7984 Long term (current) use of oral hypoglycemic drugs: Secondary | ICD-10-CM | POA: Diagnosis not present

## 2021-04-10 DIAGNOSIS — I2699 Other pulmonary embolism without acute cor pulmonale: Secondary | ICD-10-CM | POA: Diagnosis not present

## 2021-04-10 DIAGNOSIS — E119 Type 2 diabetes mellitus without complications: Secondary | ICD-10-CM | POA: Diagnosis not present

## 2021-04-10 DIAGNOSIS — I2510 Atherosclerotic heart disease of native coronary artery without angina pectoris: Secondary | ICD-10-CM | POA: Diagnosis not present

## 2021-04-10 DIAGNOSIS — Z7902 Long term (current) use of antithrombotics/antiplatelets: Secondary | ICD-10-CM | POA: Diagnosis not present

## 2021-04-10 DIAGNOSIS — I10 Essential (primary) hypertension: Secondary | ICD-10-CM | POA: Diagnosis not present

## 2021-04-10 DIAGNOSIS — Z7901 Long term (current) use of anticoagulants: Secondary | ICD-10-CM | POA: Diagnosis not present

## 2021-04-10 NOTE — Telephone Encounter (Signed)
Oral Oncology Patient Advocate Encounter ? ?Prior Authorization for Eyvonne Left has been approved.   ? ?PA# [id number] ?Effective dates: 04/09/21 through 01/27/22 ? ?Patients co-pay is $0 ? ?Oral Oncology Clinic will continue to follow.  ? ?Wynn Maudlin CPHT ?Specialty Pharmacy Patient Advocate ?[place] ?Phone [phone] ?Fax [phone] ?04/10/2021 8:50 AM ? ?

## 2021-04-10 NOTE — Telephone Encounter (Signed)
Oral Oncology Patient Advocate Encounter ?  ?Received notification from [facility] that prior authorization for Eyvonne Left is required. ?  ?PA submitted on CoverMyMeds ?Key [id number] ?Status is pending ?  ?Oral Oncology Clinic will continue to follow. ? ?Wynn Maudlin CPHT ?Specialty Pharmacy Patient Advocate ?[place] ?Phone [phone] ?Fax [phone] ?04/10/2021 8:49 AM ? ? ?

## 2021-04-13 ENCOUNTER — Other Ambulatory Visit (HOSPITAL_COMMUNITY): Payer: Self-pay

## 2021-04-13 DIAGNOSIS — C3490 Malignant neoplasm of unspecified part of unspecified bronchus or lung: Secondary | ICD-10-CM | POA: Diagnosis not present

## 2021-04-16 ENCOUNTER — Other Ambulatory Visit: Payer: Self-pay

## 2021-04-16 MED ORDER — ENOXAPARIN SODIUM 80 MG/0.8ML IJ SOSY
80.0000 mg | PREFILLED_SYRINGE | Freq: Two times a day (BID) | INTRAMUSCULAR | 1 refills | Status: DC
Start: 2021-04-16 — End: 2021-05-16

## 2021-04-16 NOTE — Telephone Encounter (Signed)
Daughter calls nurse line requesting refill on Lovenox. Verified that patient would like rx sent to [facility] on [address].  ? ?Daughter is also asking how long patient will have to continue taking these injections.  ? ?Will forward to PCP. Please advise.  ? ?Talbot Grumbling, RN ? ?

## 2021-04-17 NOTE — Telephone Encounter (Signed)
Daughter calls back for status.  Advised refill was sent. Christen Bame, CMA ? ?

## 2021-04-18 ENCOUNTER — Encounter: Payer: Self-pay | Admitting: Family Medicine

## 2021-04-23 ENCOUNTER — Ambulatory Visit (INDEPENDENT_AMBULATORY_CARE_PROVIDER_SITE_OTHER): Payer: Medicare Other | Admitting: Student

## 2021-04-23 ENCOUNTER — Encounter: Payer: Self-pay | Admitting: Student

## 2021-04-23 ENCOUNTER — Other Ambulatory Visit: Payer: Self-pay

## 2021-04-23 ENCOUNTER — Telehealth: Payer: Self-pay

## 2021-04-23 VITALS — BP 115/52 | HR 68 | Ht 71.0 in | Wt 155.0 lb

## 2021-04-23 DIAGNOSIS — R2241 Localized swelling, mass and lump, right lower limb: Secondary | ICD-10-CM

## 2021-04-23 DIAGNOSIS — R198 Other specified symptoms and signs involving the digestive system and abdomen: Secondary | ICD-10-CM | POA: Diagnosis not present

## 2021-04-23 DIAGNOSIS — M7632 Iliotibial band syndrome, left leg: Secondary | ICD-10-CM

## 2021-04-23 MED ORDER — POLYETHYLENE GLYCOL 3350 17 GM/SCOOP PO POWD: 17.0000 g | Freq: Every day | ORAL | 1 refills | Status: DC

## 2021-04-23 NOTE — Patient Instructions (Signed)
It was great to see you! Thank you for allowing me to participate in your care! ? ?I recommend that you always bring your medications to each appointment as this makes it easy to ensure we are on the correct medications and helps [place] not miss when refills are needed. ? ?Our plans for today:  ?- Continue Lovenox, may apply to anterior (front of) thigh ?- Nodule on back of hamstrings, right:  ?Continue to monitor, may go away on it's on. ?Follow up in 3-6 months as needed. ?Return if gaining in size, changes in pain, changes in range of motion at knee, redness/swelling in legs ?- Tenderness on lateral thigh, left,  ? It appears that your IT band may be tight. I recommend 'IT stretching' exercises to help relive this. ? -Youtube: IT band exercises/stretching ?-Constipation ? Take 1 cap of Miralax (17 g) daily, for softer stools ? ? ?Take care and seek immediate care sooner if you develop any concerns.  ? ?Dr. Holley Bouche, MD ?[place] ? ?

## 2021-04-23 NOTE — Telephone Encounter (Signed)
Patients daughter calls nurse line reporting a "knot" behind right knee, thigh area.  ? ?Daughter denies any pain to the area. She reports they just noticed it last night.  ? ?Patient has a hx of DVT. Denies SOB or chest pains.  ? ?Patient scheduled for this morning for evaluation.   ?

## 2021-04-23 NOTE — Progress Notes (Signed)
?  SUBJECTIVE:  ? ?CHIEF COMPLAINT / HPI:  ? ?Knott behind right knee ?Noticed it yesterday, was the size of a quarter but is now the size of dime today. No pain, no changes in balance, although balance has been off today. No problems walking or standing, no dizziness, muscle not tight. Never had anything like this before.  ? ?Left sided hip lateral thigh pain ?Pain started last night, only with palpation, rated 5-6/10. Pain is dull/ache that comes and goes. Lasting 10 min or so then goes, then reappears after 2-3 days. No changes in balance/strength walking. No hx of trauma or injury.  ? ? ?Constipation ?Go's regularly but has to push to get it out. Stool coming out very dry and dark colored. Taking Iron.  ?Having a BM every day. No blood in stools. No pain with stooling. Drinks 3-4 waterbottels a day and other fluids.  ? ?PERTINENT  PMH / PSH: DM, HTN, DVT/PE ? ? ?OBJECTIVE:  ?BP (!) 115/52   Pulse 68   Ht 5\' 11"  (1.803 m)   Wt 155 lb (70.3 kg)   SpO2 99%   BMI 21.62 kg/m?  ? ?General: NAD, pleasant, able to participate in exam ?Neuro: alert, no obvious focal deficits, speech normal ?Psych: Normal affect and mood ?Physical Exam ?Musculoskeletal:  ?   Right hip: Normal.  ?   Left hip: Normal.  ?   Right upper leg: No swelling, edema, lacerations, tenderness or bony tenderness. Deformity: nodule located on distal 2/3 lateral edge of right hamstring, round/ovoid, measureing about the size of dime. ?   Left upper leg: Tenderness present. No swelling, edema, deformity, lacerations or bony tenderness.  ?   Comments: No overlying skin changes, nodule is firm and mobile, 10 mm x 9 mm  ? ? ? ?ASSESSMENT/PLAN:  ?No problem-specific Assessment & Plan notes found for this encounter. ? ?Straining with BM ?Patient straining with BM, but having regular BM daily. Patient drinks water, but would likely benefit from stool softener.  ?-Miralax 17 g daily ? ?Left thigh tenderness ?Patient with tenderness in left thigh on lateral  surface, over IT band. IT band TTP and appears tight on exam. Pain is intermittent and not inhibiting his daily function. Recommend IT band exercises ?-IT Band exercises/stretching ? ?Right hamstring nodule ?Nodule located on right hamstring, distal lateral edge. Patient noted it's been decreasing in size. Nodule was palpated without tenderness, and is not affecting patient function. Patient was concerned for DVT, given history. Patient has no leg edema/swelling or redness, no TTP in extremity, and extremities are symmetric. Low concern for DVT at this time. Nodule not located in popliteal fossa, making baker's cyst alo less likely. Will continue to monitor.  ?-Monitor, f/u in 3-6 months prn ? ?No orders of the defined types were placed in this encounter. ? ?Meds ordered this encounter  ?Medications  ? polyethylene glycol powder (GLYCOLAX/MIRALAX) 17 GM/SCOOP powder  ?  Sig: Take 17 g by mouth daily.  ?  Dispense:  3350 g  ?  Refill:  1  ? ?No follow-ups on file. ?@SIGNNOTE @ ? ?

## 2021-04-26 ENCOUNTER — Encounter: Payer: Self-pay | Admitting: Family Medicine

## 2021-04-26 ENCOUNTER — Other Ambulatory Visit: Payer: Self-pay | Admitting: Student

## 2021-04-26 ENCOUNTER — Other Ambulatory Visit (HOSPITAL_COMMUNITY): Payer: Self-pay

## 2021-04-26 MED ORDER — BACLOFEN 10 MG PO TABS: 5.0000 mg | ORAL_TABLET | ORAL | 0 refills | Status: AC | PRN

## 2021-04-26 NOTE — Telephone Encounter (Signed)
Daughter calls nurse line in regards to [employer].  ? ?Daughter reports he was seen earlier this week for muscle pain and he would like to try a muscle relaxer.  ? ?Daughter reports they will be out of town as of today and will need prescription to go to [facility] in [place].  ? ?I have added this pharmacy to his list.  ? ?Will forward to PCP and provider who saw patient.  ?

## 2021-04-26 NOTE — Progress Notes (Signed)
[name] daughter called nurse line stating patient in severe leg pain, requesting muscle relaxer. Will prescribe short supply (5 pills) of baclofen 5 mg, to be taken as needed.  ?

## 2021-04-27 DIAGNOSIS — I959 Hypotension, unspecified: Secondary | ICD-10-CM | POA: Diagnosis not present

## 2021-04-27 DIAGNOSIS — R404 Transient alteration of awareness: Secondary | ICD-10-CM | POA: Diagnosis not present

## 2021-04-27 DIAGNOSIS — R0602 Shortness of breath: Secondary | ICD-10-CM | POA: Diagnosis not present

## 2021-04-27 DIAGNOSIS — K922 Gastrointestinal hemorrhage, unspecified: Secondary | ICD-10-CM | POA: Diagnosis not present

## 2021-04-27 DIAGNOSIS — Z743 Need for continuous supervision: Secondary | ICD-10-CM | POA: Diagnosis not present

## 2021-04-27 DIAGNOSIS — M109 Gout, unspecified: Secondary | ICD-10-CM | POA: Diagnosis not present

## 2021-04-27 DIAGNOSIS — C787 Secondary malignant neoplasm of liver and intrahepatic bile duct: Secondary | ICD-10-CM | POA: Diagnosis not present

## 2021-04-27 DIAGNOSIS — D62 Acute posthemorrhagic anemia: Secondary | ICD-10-CM | POA: Diagnosis not present

## 2021-04-27 DIAGNOSIS — S7012XA Contusion of left thigh, initial encounter: Secondary | ICD-10-CM | POA: Diagnosis not present

## 2021-04-27 DIAGNOSIS — Z7901 Long term (current) use of anticoagulants: Secondary | ICD-10-CM | POA: Diagnosis not present

## 2021-04-27 DIAGNOSIS — R9431 Abnormal electrocardiogram [ECG] [EKG]: Secondary | ICD-10-CM | POA: Diagnosis not present

## 2021-04-27 DIAGNOSIS — G459 Transient cerebral ischemic attack, unspecified: Secondary | ICD-10-CM | POA: Diagnosis not present

## 2021-04-27 DIAGNOSIS — Z8673 Personal history of transient ischemic attack (TIA), and cerebral infarction without residual deficits: Secondary | ICD-10-CM | POA: Diagnosis not present

## 2021-04-27 DIAGNOSIS — Z85118 Personal history of other malignant neoplasm of bronchus and lung: Secondary | ICD-10-CM | POA: Diagnosis not present

## 2021-04-27 DIAGNOSIS — R42 Dizziness and giddiness: Secondary | ICD-10-CM | POA: Diagnosis not present

## 2021-04-27 DIAGNOSIS — E1122 Type 2 diabetes mellitus with diabetic chronic kidney disease: Secondary | ICD-10-CM | POA: Diagnosis not present

## 2021-04-27 DIAGNOSIS — R6889 Other general symptoms and signs: Secondary | ICD-10-CM | POA: Diagnosis not present

## 2021-04-27 DIAGNOSIS — Z86711 Personal history of pulmonary embolism: Secondary | ICD-10-CM | POA: Diagnosis not present

## 2021-04-27 DIAGNOSIS — N179 Acute kidney failure, unspecified: Secondary | ICD-10-CM | POA: Diagnosis not present

## 2021-04-27 DIAGNOSIS — H539 Unspecified visual disturbance: Secondary | ICD-10-CM | POA: Diagnosis not present

## 2021-04-27 DIAGNOSIS — Z955 Presence of coronary angioplasty implant and graft: Secondary | ICD-10-CM | POA: Diagnosis not present

## 2021-04-27 DIAGNOSIS — M7989 Other specified soft tissue disorders: Secondary | ICD-10-CM | POA: Diagnosis not present

## 2021-04-27 DIAGNOSIS — I2510 Atherosclerotic heart disease of native coronary artery without angina pectoris: Secondary | ICD-10-CM | POA: Diagnosis not present

## 2021-04-27 DIAGNOSIS — R195 Other fecal abnormalities: Secondary | ICD-10-CM | POA: Diagnosis not present

## 2021-04-27 DIAGNOSIS — Z888 Allergy status to other drugs, medicaments and biological substances status: Secondary | ICD-10-CM | POA: Diagnosis not present

## 2021-04-27 DIAGNOSIS — E861 Hypovolemia: Secondary | ICD-10-CM | POA: Diagnosis not present

## 2021-04-27 DIAGNOSIS — M79662 Pain in left lower leg: Secondary | ICD-10-CM | POA: Diagnosis not present

## 2021-04-27 DIAGNOSIS — R1110 Vomiting, unspecified: Secondary | ICD-10-CM | POA: Diagnosis not present

## 2021-04-27 DIAGNOSIS — S79922A Unspecified injury of left thigh, initial encounter: Secondary | ICD-10-CM | POA: Diagnosis not present

## 2021-04-27 DIAGNOSIS — Z86718 Personal history of other venous thrombosis and embolism: Secondary | ICD-10-CM | POA: Diagnosis not present

## 2021-04-27 DIAGNOSIS — I82442 Acute embolism and thrombosis of left tibial vein: Secondary | ICD-10-CM | POA: Diagnosis not present

## 2021-04-27 DIAGNOSIS — Z7984 Long term (current) use of oral hypoglycemic drugs: Secondary | ICD-10-CM | POA: Diagnosis not present

## 2021-04-27 DIAGNOSIS — I82432 Acute embolism and thrombosis of left popliteal vein: Secondary | ICD-10-CM | POA: Diagnosis not present

## 2021-04-27 DIAGNOSIS — C3491 Malignant neoplasm of unspecified part of right bronchus or lung: Secondary | ICD-10-CM | POA: Diagnosis not present

## 2021-04-27 DIAGNOSIS — E785 Hyperlipidemia, unspecified: Secondary | ICD-10-CM | POA: Diagnosis not present

## 2021-04-27 DIAGNOSIS — G5732 Lesion of lateral popliteal nerve, left lower limb: Secondary | ICD-10-CM | POA: Diagnosis not present

## 2021-04-27 DIAGNOSIS — Z7902 Long term (current) use of antithrombotics/antiplatelets: Secondary | ICD-10-CM | POA: Diagnosis not present

## 2021-04-27 DIAGNOSIS — J439 Emphysema, unspecified: Secondary | ICD-10-CM | POA: Diagnosis not present

## 2021-04-27 DIAGNOSIS — I129 Hypertensive chronic kidney disease with stage 1 through stage 4 chronic kidney disease, or unspecified chronic kidney disease: Secondary | ICD-10-CM | POA: Diagnosis not present

## 2021-04-27 DIAGNOSIS — N183 Chronic kidney disease, stage 3 unspecified: Secondary | ICD-10-CM | POA: Diagnosis not present

## 2021-04-27 DIAGNOSIS — K219 Gastro-esophageal reflux disease without esophagitis: Secondary | ICD-10-CM | POA: Diagnosis not present

## 2021-04-27 DIAGNOSIS — R55 Syncope and collapse: Secondary | ICD-10-CM | POA: Diagnosis not present

## 2021-04-27 DIAGNOSIS — I639 Cerebral infarction, unspecified: Secondary | ICD-10-CM | POA: Diagnosis not present

## 2021-04-27 DIAGNOSIS — I82409 Acute embolism and thrombosis of unspecified deep veins of unspecified lower extremity: Secondary | ICD-10-CM | POA: Diagnosis not present

## 2021-04-27 DIAGNOSIS — D649 Anemia, unspecified: Secondary | ICD-10-CM | POA: Diagnosis not present

## 2021-04-28 DIAGNOSIS — D649 Anemia, unspecified: Secondary | ICD-10-CM | POA: Diagnosis not present

## 2021-04-28 DIAGNOSIS — S7012XA Contusion of left thigh, initial encounter: Secondary | ICD-10-CM | POA: Diagnosis not present

## 2021-04-28 DIAGNOSIS — I82432 Acute embolism and thrombosis of left popliteal vein: Secondary | ICD-10-CM | POA: Diagnosis not present

## 2021-04-30 ENCOUNTER — Ambulatory Visit (HOSPITAL_BASED_OUTPATIENT_CLINIC_OR_DEPARTMENT_OTHER): Payer: Medicare Other | Admitting: Cardiology

## 2021-04-30 ENCOUNTER — Other Ambulatory Visit (HOSPITAL_COMMUNITY): Payer: Self-pay

## 2021-04-30 NOTE — Progress Notes (Incomplete)
?Cardiology Office Note:   ? ?Date:  04/30/2021  ? ?ID:  Master, Touchet 07-09-1940, MRN [id number] ? ?PCP:  Lind Covert, MD  ?Cardiologist:  Buford Dresser, MD ? ?Referring MD: Lind Covert, *  ? ?CC: post hospital follow up ? ?History of Present Illness:   ? ?Arthur [name] is a 81 y.o. male with a hx of CAD, hypertension, type II diabetes, hyperlipidemia PE/DVT who is seen for follow-up. I initially met him 02/19/2021 as a 14 day transition of care follow up for recent admission. Discharge summary and hospital course reviewed, full medication reconciliation done today. ? ?Hospital summary: He presented 02/04/21 with shortness of breath. Found to have multiple PE on evaluation in the ER. While in the ER, he had a cardiac arrest with ROSC obtained with CPR/Epi. ECG done prior concerning for VT. He was stabilized in the ICU. Cath done during the admission noted 80% mLAD lesion that underwent PCI. Notably CT also showed multiple lung nodules concerning for metastatic disease. He has a repeat echo scheduled for 03/27/21. ? ?Today: ?Has no appetite, has lost about 30 lbs in the last month. He does try to get in an Ensure supplement daily. Has PET scan later this week. Sleep is good. No issues with bleeding. Chronic cough with white sputum. ? ?Today: ?Since his last visit, he presented for planned admission 03/15/2021 for a Plavix washout and transition to reversible anticoagulation therapy for lung biopsy. ? ?Overall, ? ?He denies any palpitations, chest pain, shortness of breath, or peripheral edema. No lightheadedness, headaches, syncope, orthopnea, or PND. ? ?(+) ? ?Past Medical History:  ?Diagnosis Date  ? Diabetes mellitus without complication ([place])   ? High cholesterol   ? Hypertension   ? Normal nuclear stress test 2010  ? stress perfusion study apparently in 2010 in [facility] which he said was negative.  ? ? ?Past Surgical History:  ?Procedure Laterality Date  ? CATARACT  EXTRACTION, BILATERAL  2006  ? CORONARY STENT INTERVENTION N/A 02/06/2021  ? Procedure: CORONARY STENT INTERVENTION;  Surgeon: Jettie Booze, MD;  Location: [facility];  Service: Cardiovascular;  Laterality: N/A;  ? FINE NEEDLE ASPIRATION  03/21/2021  ? Procedure: FINE NEEDLE ASPIRATION;  Surgeon: Garner Nash, DO;  Location: [facility];  Service: Pulmonary;;  ? INTRAVASCULAR ULTRASOUND/IVUS N/A 02/06/2021  ? Procedure: Intravascular Ultrasound/IVUS;  Surgeon: Jettie Booze, MD;  Location: [facility];  Service: Cardiovascular;  Laterality: N/A;  ? KNEE SURGERY  2005  ? LEFT HEART CATH AND CORONARY ANGIOGRAPHY N/A 02/06/2021  ? Procedure: LEFT HEART CATH AND CORONARY ANGIOGRAPHY;  Surgeon: Jettie Booze, MD;  Location: [facility];  Service: Cardiovascular;  Laterality: N/A;  ? VIDEO BRONCHOSCOPY WITH ENDOBRONCHIAL ULTRASOUND Bilateral 03/21/2021  ? Procedure: VIDEO BRONCHOSCOPY WITH ENDOBRONCHIAL ULTRASOUND;  Surgeon: Garner Nash, DO;  Location: [facility];  Service: Pulmonary;  Laterality: Bilateral;  ? ? ?Current Medications: ?Current Outpatient Medications on File Prior to Visit  ?Medication Sig  ? acetaminophen (TYLENOL) 650 MG CR tablet Take 650 mg by mouth every 8 (eight) hours as needed for pain.  ? albuterol (PROAIR HFA) 108 (90 Base) MCG/ACT inhaler Inhale 2 puffs into the lungs every 4 (four) hours as needed for wheezing or shortness of breath.  ? alectinib (ALECENSA) 150 MG capsule Take 4 capsules (600 mg total) by mouth 2 (two) times daily with a meal.  ? allopurinol (ZYLOPRIM) 100 MG tablet Take 1 tablet (100  mg total) by mouth daily.  ? atenolol (TENORMIN) 25 MG tablet Take 1 tablet (25 mg total) by mouth daily.  ? baclofen (LIORESAL) 10 MG tablet Take 0.5 tablets (5 mg total) by mouth as needed for up to 10 days for muscle spasms.  ? clopidogrel (PLAVIX) 75 MG tablet Take 1 tablet (75 mg total) by mouth daily.  ? colchicine 0.6 MG tablet Take 1/2  tablet (0.3 mg total) by mouth daily.  ? diclofenac Sodium (VOLTAREN) 1 % GEL Apply 2 g topically 4 (four) times daily. (Patient taking differently: Apply 2-4 g topically 4 (four) times daily as needed (knees/legs).)  ? empagliflozin (JARDIANCE) 10 MG TABS tablet Take 1 tablet (10 mg total) by mouth daily.  ? enoxaparin (LOVENOX) 80 MG/0.8ML injection Inject 1 syringe (0.8 mLs /80 mg total) into the skin every 12 (twelve) hours.  ? ferrous sulfate 324 (65 Fe) MG TBEC Take 324 mg by mouth daily.  ? losartan (COZAAR) 25 MG tablet Take 1 tablet (25 mg total) by mouth daily.  ? metFORMIN (GLUCOPHAGE) 1000 MG tablet Take 1 tablet (1,000 mg total) by mouth 2 (two) times daily with a meal.  ? Multiple Vitamins-Minerals (CENTRUM SILVER 50+MEN) TABS Take 1 tablet by mouth daily.  ? polyethylene glycol powder (GLYCOLAX/MIRALAX) 17 GM/SCOOP powder Take 17 g by mouth daily.  ? prochlorperazine (COMPAZINE) 10 MG tablet Take 1 tablet (10 mg total) by mouth every 6 (six) hours as needed.  ? rosuvastatin (CRESTOR) 40 MG tablet Take 1 tablet (40 mg total) by mouth daily.  ? trolamine salicylate (ASPER-FLEX) 10 % cream Apply 1 application topically as needed for muscle pain.  ? ?No current facility-administered medications on file prior to visit.  ?  ? ?Allergies:   Ace inhibitors  ? ?Social History  ? ?Tobacco Use  ? Smoking status: Never  ? Smokeless tobacco: Never  ?Substance Use Topics  ? Alcohol use: No  ? Drug use: No  ? ? ?Family History: ?family history includes Coronary artery disease in his father and mother. ? ?ROS:   ?Please see the history of present illness.   ? ?Additional pertinent ROS otherwise unremarkable. ? ?EKGs/Labs/Other Studies Reviewed:   ? ?The following studies were reviewed today: ? ?CTA Chest 04/27/2021 ([facility]): ?Impressions: ?1. No pulmonary embolus.  ?2. Small 1.2 cm RLL nodule. Mild mediastinal lymphadenopathy. Comparison with outside CTs would be helpful to assess for change in previously  diagnosed neoplastic disease (stage IV lung cancer).  ?3. Cardiomegaly and calcific CAD. No CHF.  ?4. COPD/emphysema.  ?5. Other chronic/incidental findings as described.  ? ?Transcranial Doppler 03/19/2021: ?Summary:  ? ?A vascular evaluation was performed. The left middle cerebral artery was  ?studied. An IV was inserted into the patient's right forearm. Verbal  ?informed consent was obtained.  ?   ?Less than 10 latent high intensity transient signals (HITS) were observed  ?at rest and with valsalva maneuver, indicating a possible intrapulmonary  ?shunt. Today's examination was limited due to suboptimal valsalva  ?maneuver.  ? ?Possitive TCD Bubble study indicative of a small right to left shunt  ?*See table(s) above for TCD measurements and observations.  ? ?Echo Bubble Study 03/18/2021: ? 1. Left ventricular ejection fraction, by estimation, is 55 to 60%. The  ?left ventricle has normal function. The left ventricle demonstrates  ?regional wall motion abnormalities (see scoring diagram/findings for  ?description). There is severe asymmetric  ?left ventricular hypertrophy of the basal-septal segment. Left ventricular  ?diastolic parameters are consistent with  Grade II diastolic dysfunction  ?(pseudonormalization).  ? 2. Right ventricular systolic function is normal. The right ventricular  ?size is normal. Tricuspid regurgitation signal is inadequate for assessing  ?PA pressure.  ? 3. A small pericardial effusion is present. The pericardial effusion is  ?anterior to the right ventricle.  ? 4. The mitral valve is grossly normal. No evidence of mitral valve  ?regurgitation. No evidence of mitral stenosis.  ? 5. The aortic valve is calcified. Aortic valve regurgitation is not  ?visualized. Aortic valve sclerosis is present, with no evidence of aortic  ?valve stenosis.  ? 6. Agitated saline contrast bubble study was negative, with no evidence  ?of any interatrial shunt.  ? ?Comparison(s): Slight improvementment in LV and  RV function with  ?persistent WMA.  ? ?LE Venous Doppler 03/18/2021 ?Summary:  ?RIGHT:  ?- Findings consistent with acute deep vein thrombosis involving the right  ?popliteal vein, right posterior tibial veins, ri

## 2021-05-01 ENCOUNTER — Inpatient Hospital Stay: Payer: Medicare Other | Admitting: Dietician

## 2021-05-01 ENCOUNTER — Inpatient Hospital Stay: Payer: Medicare Other

## 2021-05-01 ENCOUNTER — Inpatient Hospital Stay: Payer: Medicare Other | Admitting: Internal Medicine

## 2021-05-03 ENCOUNTER — Encounter (HOSPITAL_COMMUNITY): Payer: Self-pay

## 2021-05-03 ENCOUNTER — Other Ambulatory Visit (HOSPITAL_COMMUNITY): Payer: Self-pay

## 2021-05-11 ENCOUNTER — Encounter: Payer: Self-pay | Admitting: Family Medicine

## 2021-05-11 DIAGNOSIS — S7010XA Contusion of unspecified thigh, initial encounter: Secondary | ICD-10-CM | POA: Diagnosis not present

## 2021-05-11 DIAGNOSIS — M79606 Pain in leg, unspecified: Secondary | ICD-10-CM | POA: Diagnosis not present

## 2021-05-12 ENCOUNTER — Encounter: Payer: Self-pay | Admitting: Family Medicine

## 2021-05-14 ENCOUNTER — Other Ambulatory Visit (HOSPITAL_COMMUNITY): Payer: Self-pay

## 2021-05-14 ENCOUNTER — Other Ambulatory Visit: Payer: Self-pay

## 2021-05-14 ENCOUNTER — Inpatient Hospital Stay: Payer: Medicare Other

## 2021-05-14 ENCOUNTER — Inpatient Hospital Stay: Payer: Medicare Other | Attending: Physician Assistant

## 2021-05-14 ENCOUNTER — Inpatient Hospital Stay (HOSPITAL_BASED_OUTPATIENT_CLINIC_OR_DEPARTMENT_OTHER): Payer: Medicare Other | Admitting: Internal Medicine

## 2021-05-14 VITALS — BP 106/52 | HR 87 | Temp 96.3°F | Resp 18 | Ht 71.0 in

## 2021-05-14 DIAGNOSIS — C3490 Malignant neoplasm of unspecified part of unspecified bronchus or lung: Secondary | ICD-10-CM

## 2021-05-14 DIAGNOSIS — Z79899 Other long term (current) drug therapy: Secondary | ICD-10-CM | POA: Diagnosis not present

## 2021-05-14 DIAGNOSIS — Z955 Presence of coronary angioplasty implant and graft: Secondary | ICD-10-CM | POA: Insufficient documentation

## 2021-05-14 DIAGNOSIS — D649 Anemia, unspecified: Secondary | ICD-10-CM | POA: Insufficient documentation

## 2021-05-14 DIAGNOSIS — I82403 Acute embolism and thrombosis of unspecified deep veins of lower extremity, bilateral: Secondary | ICD-10-CM | POA: Diagnosis not present

## 2021-05-14 DIAGNOSIS — C3491 Malignant neoplasm of unspecified part of right bronchus or lung: Secondary | ICD-10-CM

## 2021-05-14 LAB — CBC WITH DIFFERENTIAL (CANCER CENTER ONLY)
Abs Immature Granulocytes: 0.05 10*3/uL (ref 0.00–0.07)
Basophils Absolute: 0.1 10*3/uL (ref 0.0–0.1)
Basophils Relative: 1 %
Eosinophils Absolute: 0.2 10*3/uL (ref 0.0–0.5)
Eosinophils Relative: 3 %
HCT: 31.1 % — ABNORMAL LOW (ref 39.0–52.0)
Hemoglobin: 10.2 g/dL — ABNORMAL LOW (ref 13.0–17.0)
Immature Granulocytes: 1 %
Lymphocytes Relative: 21 %
Lymphs Abs: 1.7 10*3/uL (ref 0.7–4.0)
MCH: 28.7 pg (ref 26.0–34.0)
MCHC: 32.8 g/dL (ref 30.0–36.0)
MCV: 87.4 fL (ref 80.0–100.0)
Monocytes Absolute: 0.6 10*3/uL (ref 0.1–1.0)
Monocytes Relative: 7 %
Neutro Abs: 5.4 10*3/uL (ref 1.7–7.7)
Neutrophils Relative %: 67 %
Platelet Count: 595 10*3/uL — ABNORMAL HIGH (ref 150–400)
RBC: 3.56 MIL/uL — ABNORMAL LOW (ref 4.22–5.81)
RDW: 18.1 % — ABNORMAL HIGH (ref 11.5–15.5)
WBC Count: 8.1 10*3/uL (ref 4.0–10.5)
nRBC: 0 % (ref 0.0–0.2)

## 2021-05-14 LAB — CMP (CANCER CENTER ONLY)
ALT: 16 U/L (ref 0–44)
AST: 18 U/L (ref 15–41)
Albumin: 2.9 g/dL — ABNORMAL LOW (ref 3.5–5.0)
Alkaline Phosphatase: 186 U/L — ABNORMAL HIGH (ref 38–126)
Anion gap: 7 (ref 5–15)
BUN: 22 mg/dL (ref 8–23)
CO2: 27 mmol/L (ref 22–32)
Calcium: 8.9 mg/dL (ref 8.9–10.3)
Chloride: 104 mmol/L (ref 98–111)
Creatinine: 1.5 mg/dL — ABNORMAL HIGH (ref 0.61–1.24)
GFR, Estimated: 47 mL/min — ABNORMAL LOW (ref >60)
Glucose, Bld: 149 mg/dL — ABNORMAL HIGH (ref 70–99)
Potassium: 4.1 mmol/L (ref 3.5–5.1)
Sodium: 138 mmol/L (ref 135–145)
Total Bilirubin: 1.6 mg/dL — ABNORMAL HIGH (ref 0.3–1.2)
Total Protein: 6.8 g/dL (ref 6.5–8.1)

## 2021-05-14 NOTE — Progress Notes (Signed)
Nutrition Assessment ? ? ?Reason for Assessment: Weight loss and not hungry ? ? ?ASSESSMENT:  ?81 year old male with stage IV lung cancer.  Past medical history of DM, GERD, HLD, PE/DVT, CVA, CAD, CKD, cardiac arrest.  Patient taking oral chemotherapy, alectinib (was off treatment for 10 days during recent hospitalization.  Admitted with bilateral DVT, s/p IVC filter placement. ? ?Met with patient and son.  Patient states that his appetite is better and he feels hungry now.  Eating 2-3 times per day.  Patient is vegetarian.  Eats all vegetables, rice, beans, nut butters, nuts. Drinks glucerna shake 1 time per day.  Says that shakes makes him feel full.  Denies nausea.  Has a bowel movement daily.   ? ? ?Medications: jardiance, fe sulfate, metformin ? ? ?Labs: reviewed ? ? ?Anthropometrics:  ? ?Height: 71 inches ?Weight: no weight taken today ?155 lb on 3/27 ?169 lb on 11/02/2020 ?8% weight loss in 6 months, concerning ?BMI: 21 ? ? ?NUTRITION DIAGNOSIS: Unintentional weight loss due to hospitalization and cancer as evidenced by 8% weight loss in 6 months. ? ? ?INTERVENTION:  ?Encouraged good sources of protein at every meal. ?Encouraged oral nutrition supplements as able for added calories and protein.   ?Contact information given.  Son prefers to call RD if needed in the future ? ? ?Next Visit: no follow-up ?Son to call RD if needed in the future ? ?Takeela Peil B. Zenia Resides, RD, LDN ?Registered Dietitian ?[phone] ? ? ? ? ? ?

## 2021-05-14 NOTE — Progress Notes (Signed)
?    [facility] ?[phone]   [phone] ? ?OFFICE PROGRESS NOTE ? ?Lind Covert, MD ?[address] ?[place] [address] ? ?DIAGNOSIS: Stage IV (T1b, N3, M1 C) non-small cell lung cancer, adenocarcinoma with positive ALK gene translocation diagnosed in February 2023. ? ?PRIOR THERAPY: None ? ?CURRENT THERAPY: Alecensa (Alectinib) 600 mg p.o. twice daily.  First dose April 11, 2021.  Status post 2 weeks of treatment. ? ?INTERVAL HISTORY: ?Arthur [name] 81 y.o. male returns to the clinic today for follow-up visit accompanied by his son.  His daughter was available by phone during the visit.  The patient is feeling fine today with no concerning complaints except for hardening and swelling in the left lower extremity.  He was admitted to the hospital in [facility] with the swelling of the lower extremities and Doppler of the lower extremity at that time showed bilateral deep venous thrombosis.  He was on treatment with Lovenox at that time.  His treatment with Lovenox was discontinued secondary to suspicious hematoma in the lower extremities.  The patient had IVC filter placed at that time.  He has been off his treatment with Lovenox since that time.  He is currently on Plavix.  He also discontinued his treatment with Alecensa (Alectinib) for around 10 days during his hospitalization.  He has been tolerating his treatment with Alecensa fairly well with no concerning complaints.  He denied having any nausea, vomiting, diarrhea or constipation.  He has no headache or visual changes.  He has no recent weight loss or night sweats.  He is here today for evaluation and repeat blood work. ? ?MEDICAL HISTORY: ?Past Medical History:  ?Diagnosis Date  ? Diabetes mellitus without complication ([place])   ? High cholesterol   ? Hypertension   ? Normal nuclear stress test 2010  ? stress perfusion study apparently in 2010 in [facility] which he said was  negative.  ? ? ?ALLERGIES:  is allergic to ace inhibitors. ? ?MEDICATIONS:  ?Current Outpatient Medications  ?Medication Sig Dispense Refill  ? acetaminophen (TYLENOL) 650 MG CR tablet Take 650 mg by mouth every 8 (eight) hours as needed for pain.    ? albuterol (PROAIR HFA) 108 (90 Base) MCG/ACT inhaler Inhale 2 puffs into the lungs every 4 (four) hours as needed for wheezing or shortness of breath. 18 g 0  ? alectinib (ALECENSA) 150 MG capsule Take 4 capsules (600 mg total) by mouth 2 (two) times daily with a meal. 240 capsule 3  ? allopurinol (ZYLOPRIM) 100 MG tablet Take 1 tablet (100 mg total) by mouth daily. 30 tablet 6  ? atenolol (TENORMIN) 25 MG tablet Take 1 tablet (25 mg total) by mouth daily. 90 tablet 0  ? clopidogrel (PLAVIX) 75 MG tablet Take 1 tablet (75 mg total) by mouth daily. 90 tablet 1  ? colchicine 0.6 MG tablet Take 1/2 tablet (0.3 mg total) by mouth daily. 30 tablet 1  ? diclofenac Sodium (VOLTAREN) 1 % GEL Apply 2 g topically 4 (four) times daily. (Patient taking differently: Apply 2-4 g topically 4 (four) times daily as needed (knees/legs).) 50 g 1  ? empagliflozin (JARDIANCE) 10 MG TABS tablet Take 1 tablet (10 mg total) by mouth daily. 90 tablet 0  ? enoxaparin (LOVENOX) 80 MG/0.8ML injection Inject 1 syringe (0.8 mLs /80 mg total) into the skin every 12 (twelve) hours. 48 mL 1  ? ferrous sulfate 324 (65 Fe) MG TBEC Take 324  mg by mouth daily. 30 tablet   ? losartan (COZAAR) 25 MG tablet Take 1 tablet (25 mg total) by mouth daily. 90 tablet 0  ? metFORMIN (GLUCOPHAGE) 1000 MG tablet Take 1 tablet (1,000 mg total) by mouth 2 (two) times daily with a meal. 180 tablet 3  ? Multiple Vitamins-Minerals (CENTRUM SILVER 50+MEN) TABS Take 1 tablet by mouth daily.    ? polyethylene glycol powder (GLYCOLAX/MIRALAX) 17 GM/SCOOP powder Take 17 g by mouth daily. 3350 g 1  ? prochlorperazine (COMPAZINE) 10 MG tablet Take 1 tablet (10 mg total) by mouth every 6 (six) hours as needed. 30 tablet 2  ?  rosuvastatin (CRESTOR) 40 MG tablet Take 1 tablet (40 mg total) by mouth daily. 90 tablet 0  ? trolamine salicylate (ASPER-FLEX) 10 % cream Apply 1 application topically as needed for muscle pain. 85 g 0  ? ?No current facility-administered medications for this visit.  ? ? ?SURGICAL HISTORY:  ?Past Surgical History:  ?Procedure Laterality Date  ? CATARACT EXTRACTION, BILATERAL  2006  ? CORONARY STENT INTERVENTION N/A 02/06/2021  ? Procedure: CORONARY STENT INTERVENTION;  Surgeon: Jettie Booze, MD;  Location: [facility];  Service: Cardiovascular;  Laterality: N/A;  ? FINE NEEDLE ASPIRATION  03/21/2021  ? Procedure: FINE NEEDLE ASPIRATION;  Surgeon: Garner Nash, DO;  Location: [facility];  Service: Pulmonary;;  ? INTRAVASCULAR ULTRASOUND/IVUS N/A 02/06/2021  ? Procedure: Intravascular Ultrasound/IVUS;  Surgeon: Jettie Booze, MD;  Location: [facility];  Service: Cardiovascular;  Laterality: N/A;  ? KNEE SURGERY  2005  ? LEFT HEART CATH AND CORONARY ANGIOGRAPHY N/A 02/06/2021  ? Procedure: LEFT HEART CATH AND CORONARY ANGIOGRAPHY;  Surgeon: Jettie Booze, MD;  Location: [facility];  Service: Cardiovascular;  Laterality: N/A;  ? VIDEO BRONCHOSCOPY WITH ENDOBRONCHIAL ULTRASOUND Bilateral 03/21/2021  ? Procedure: VIDEO BRONCHOSCOPY WITH ENDOBRONCHIAL ULTRASOUND;  Surgeon: Garner Nash, DO;  Location: [facility];  Service: Pulmonary;  Laterality: Bilateral;  ? ? ?REVIEW OF SYSTEMS:  Constitutional: positive for fatigue ?Eyes: negative ?Ears, nose, mouth, throat, and face: negative ?Respiratory: negative ?Cardiovascular: negative ?Gastrointestinal: negative ?Genitourinary:negative ?Integument/breast: negative ?Hematologic/lymphatic: negative ?Musculoskeletal:positive for muscle weakness ?Neurological: negative ?Behavioral/Psych: negative ?Endocrine: negative ?Allergic/Immunologic: negative  ? ?PHYSICAL EXAMINATION: General appearance: alert, cooperative, fatigued, and no  distress ?Head: Normocephalic, without obvious abnormality, atraumatic ?Neck: no adenopathy, no JVD, supple, symmetrical, trachea midline, and thyroid not enlarged, symmetric, no tenderness/mass/nodules ?Lymph nodes: Cervical, supraclavicular, and axillary nodes normal. ?Resp: clear to auscultation bilaterally ?Back: symmetric, no curvature. ROM normal. No CVA tenderness. ?Cardio: regular rate and rhythm, S1, S2 normal, no murmur, click, rub or gallop ?GI: soft, non-tender; bowel sounds normal; no masses,  no organomegaly ?Extremities: extremities normal, atraumatic, no cyanosis or edema ?Neurologic: Alert and oriented X 3, normal strength and tone. Normal symmetric reflexes. Normal coordination and gait ? ?ECOG PERFORMANCE STATUS: 1 - Symptomatic but completely ambulatory ? ?Blood pressure (!) 106/52, pulse 87, temperature (!) 96.3 ?F (35.7 ?C), temperature source Tympanic, resp. rate 18, height $RemoveBe'5\' 11"'vQrutwiYw$  (1.803 m), SpO2 99 %. ? ?LABORATORY DATA: ?Lab Results  ?Component Value Date  ? WBC 8.1 05/14/2021  ? HGB 10.2 (L) 05/14/2021  ? HCT 31.1 (L) 05/14/2021  ? MCV 87.4 05/14/2021  ? PLT 595 (H) 05/14/2021  ? ? ?  Chemistry   ?   ?Component Value Date/Time  ? NA 140 04/09/2021 1357  ? NA 141 03/27/2021 1027  ? K 4.5 04/09/2021 1357  ? CL 107  04/09/2021 1357  ? CO2 25 04/09/2021 1357  ? BUN 18 04/09/2021 1357  ? BUN 20 03/27/2021 1027  ? CREATININE 1.18 04/09/2021 1357  ? CREATININE 1.18 11/15/2015 1114  ?    ?Component Value Date/Time  ? CALCIUM 9.2 04/09/2021 1357  ? ALKPHOS 157 (H) 04/09/2021 1357  ? AST 17 04/09/2021 1357  ? ALT 13 04/09/2021 1357  ? BILITOT 0.5 04/09/2021 1357  ?  ? ? ? ?RADIOGRAPHIC STUDIES: ?No results found. ? ?ASSESSMENT AND PLAN: This is a very pleasant 81 years old [place] male diagnosed with Stage IV (T1b, N3, M1 C) non-small cell lung cancer, adenocarcinoma with positive ALK gene translocation in February 2023. ?The patient is currently undergoing treatment with Alecensa (Alectinib) 600 mg  p.o. twice daily.  He has been tolerating the treatment well with no concerning complaints.  His treatment was interrupted for around 10 days when he was admitted to the hospital with bilateral DVT. ?I recomm

## 2021-05-14 NOTE — Progress Notes (Signed)
?  [place]  ? ? ?SUBJECTIVE:  ? ?CHIEF COMPLAINT / HPI:  ? ?Accompanied by his son.  Sitting in [place].  Uses walker at home  ? ?L Leg Swelling ?Hospitilized in [place] with lower extremity DVTs and VC filter placed and stopped lovenox.  Initially leg was warm hard and swollen.  Has improved but still has ankle lower leg swelling and some pain ? ?R Groin swelling ?Intermittently has focal soft tissue swelling in R groin.  Usually goes away when he lies down  ? ?Diabetes  ?Brings in his medications and is taking regularly.  No low blood sugar  ? ?CAD ?Was taken off bb during recent hospitilization due to low blood pressure.  No chest pain or unusual shortness of breath with exertion. Taking low dose ARB  ? ? ?PERTINENT  PMH / PSH: Oncology visit 4/12  "He was admitted to the hospital in [facility] with the swelling of the lower extremities and Doppler of the lower extremity at that time showed bilateral deep venous thrombosis.  He was on treatment with Lovenox at that time.  His treatment with Lovenox was discontinued secondary to suspicious hematoma in the lower extremities.  The patient had IVC filter placed at that time.  He has been off his treatment with Lovenox since that time.  He is currently on Plavix.  He also discontinued his treatment with Alecensa (Alectinib) for around 10 days during his hospitalization.  He has been tolerating his treatment with Alecensa fairly  ? ?OBJECTIVE:  ? ?BP (!) 90/57   Pulse 81   SpO2 100%   ?Heart - Regular rate and rhythm.  No murmurs, gallops or rubs.    ?Lungs:  Normal respiratory effort, chest expands symmetrically. Lungs are clear to auscultation, no crackles or wheezes. ?Legs ?L leg - 2-3+ pitting edema at ankle.  Nontender thigh and calf.  Mild induration back of thigh sitting in wc No palpable masses or cords ?R leg - 1-2+ pitting edema.   ?Abdomen - no masses palpated in groin femoral area  ? ?ASSESSMENT/PLAN:  ? ?Groin  swelling ?Sounds consistent with a reducible hernia.  Not felt on exam.  Precautions given  ? ?Anemia ?HemeOnc following - on iron daily  ? ?Deep venous embolism and thrombosis ([place]) ?With recurrent lower extremity clots.  These seems to be causing edema.  Suggested compression stockings, elevation and Physical Therapy.  Heme Onc will decide to restart anticoagulation or not.   ? ? ?Adenocarcinoma of right lung, stage 4 ([place]) ?Seeing Dr Julien Nordmann regularly.   Back on Alecensa after being off for about two weeks with in hospital [place]  ? ?Coronary artery disease involving native heart ?Recommend he follow up with cardiology.  No angina currently off all antianginals. ?  ? ? ?Lind Covert, MD ?[place]  ?

## 2021-05-15 ENCOUNTER — Other Ambulatory Visit (HOSPITAL_COMMUNITY): Payer: Self-pay

## 2021-05-16 ENCOUNTER — Telehealth: Payer: Self-pay | Admitting: *Deleted

## 2021-05-16 ENCOUNTER — Encounter: Payer: Self-pay | Admitting: Family Medicine

## 2021-05-16 ENCOUNTER — Ambulatory Visit (INDEPENDENT_AMBULATORY_CARE_PROVIDER_SITE_OTHER): Payer: Medicare Other | Admitting: Family Medicine

## 2021-05-16 ENCOUNTER — Other Ambulatory Visit: Payer: Self-pay

## 2021-05-16 DIAGNOSIS — I82409 Acute embolism and thrombosis of unspecified deep veins of unspecified lower extremity: Secondary | ICD-10-CM

## 2021-05-16 DIAGNOSIS — K4090 Unilateral inguinal hernia, without obstruction or gangrene, not specified as recurrent: Secondary | ICD-10-CM | POA: Insufficient documentation

## 2021-05-16 DIAGNOSIS — R1909 Other intra-abdominal and pelvic swelling, mass and lump: Secondary | ICD-10-CM

## 2021-05-16 DIAGNOSIS — I2510 Atherosclerotic heart disease of native coronary artery without angina pectoris: Secondary | ICD-10-CM | POA: Diagnosis not present

## 2021-05-16 DIAGNOSIS — C3491 Malignant neoplasm of unspecified part of right bronchus or lung: Secondary | ICD-10-CM | POA: Diagnosis not present

## 2021-05-16 DIAGNOSIS — D649 Anemia, unspecified: Secondary | ICD-10-CM

## 2021-05-16 NOTE — Assessment & Plan Note (Signed)
Seeing Dr Julien Nordmann regularly.   Back on Alecensa after being off for about two weeks with in hospital [place]  ?

## 2021-05-16 NOTE — Assessment & Plan Note (Signed)
HemeOnc following - on iron daily  ?

## 2021-05-16 NOTE — Assessment & Plan Note (Signed)
Sounds consistent with a reducible hernia.  Not felt on exam.  Precautions given  ?

## 2021-05-16 NOTE — Assessment & Plan Note (Signed)
Recommend he follow up with cardiology.  No angina currently off all antianginals. ?

## 2021-05-16 NOTE — Patient Instructions (Signed)
Good to see you today - Thank you for coming in ? ?Things we discussed today: ? ?Leg Swelling - knee high medium compression stockings  ?Elevated when ever sitting ?Exercise at least three times a day  ?I will order Physical Therapy ? ?Swelling in your Groin - if becomes very painful and does not go back in the go to the Emergency room  ? ?Take vitamin C 500 mg once a day with iron tablet  ? ?FMLA Paperwork - discuss with Dr Earlie Server  ? ?Please always bring your medication bottles ? ?Come back to see me in 2 months  ?

## 2021-05-16 NOTE — Assessment & Plan Note (Signed)
With recurrent lower extremity clots.  These seems to be causing edema.  Suggested compression stockings, elevation and Physical Therapy.  Heme Onc will decide to restart anticoagulation or not.   ? ?

## 2021-05-16 NOTE — Chronic Care Management (AMB) (Signed)
?  Care Management  ? ?Outreach Note ? ?05/16/2021 ?Name: Arthur [name] MRN: [id number] DOB: [birth year] ? ?Referred by: Lind Covert, MD ?Reason for referral : Care Coordination (Initial outreach to schedule referral with BSW) ? ? ?An unsuccessful telephone outreach was attempted today. The patient was referred to the case management team for assistance with care management and care coordination.  ? ?Follow Up Plan:  ?A HIPAA compliant phone message was left for the patient providing contact information and requesting a return call.  ?The care management team will reach out to the patient again over the next 7 days.  ?If patient returns call to provider office, please advise to call [place]* at [phone].* ? ?Laverda Sorenson  ?Care Guide, Embedded Care Coordination ?[place]  Care Management  ?Direct Dial: [phone] ? ?

## 2021-05-18 ENCOUNTER — Other Ambulatory Visit (HOSPITAL_COMMUNITY): Payer: Self-pay

## 2021-05-18 NOTE — Chronic Care Management (AMB) (Signed)
?  Care Management  ? ?Note ? ?05/18/2021 ?Name: DEVONTRE SIEDSCHLAG MRN: [id number] DOB: 1940-10-26 ? ?Kaegan R Grey is a 81 y.o. year old male who is a primary care patient of Chambliss, Jeb Levering, MD. I reached out to Armandina Gemma by phone today offer care coordination services.  ? ?Mr. Orlick was given information about care management services today including:  ?Care management services include personalized support from designated clinical staff supervised by his physician, including individualized plan of care and coordination with other care providers ?24/7 contact phone numbers for assistance for urgent and routine care needs. ?The patient may stop care management services at any time by phone call to the office staff. ? ?Patient agreed to services and verbal consent obtained.  ? ?Follow up plan: ?Telephone appointment with care management team member scheduled for:05/29/21 ? ?Laverda Sorenson  ?Care Guide, Embedded Care Coordination ?[place]  Care Management  ?Direct Dial: [phone] ? ?

## 2021-05-25 ENCOUNTER — Telehealth: Payer: Self-pay | Admitting: Internal Medicine

## 2021-05-25 NOTE — Telephone Encounter (Signed)
Called patient regarding upcoming May appointment, left a voicemail. ?

## 2021-05-29 ENCOUNTER — Telehealth: Payer: Medicare Other

## 2021-05-30 ENCOUNTER — Telehealth: Payer: Self-pay | Admitting: Licensed Clinical Social Worker

## 2021-05-30 NOTE — Telephone Encounter (Signed)
?  Care Management  ? ?Follow Up Note ? ? ?05/29/2021 ? ?Name: Arthur [name] MRN: [id number] DOB: 22-Aug-1940 ? ? ?Referred by: Lind Covert, MD ?Reason for referral : No chief complaint on file. ? ? ?An unsuccessful telephone outreach was attempted today. The patient was referred to the case management team for assistance with care management and care coordination.  ? ?Phone call had a continued busy tone. SW will attempt another call on 05/30/2021 ? ?Follow Up Plan: The care management team will reach out to the patient again over the next 7 days.  ? ?Milus Height, BSW,MSW  ?Social Worker ?[facility] Care Management  ?[phone] ?  ?

## 2021-05-31 ENCOUNTER — Inpatient Hospital Stay: Payer: Medicare Other | Admitting: Neurology

## 2021-06-04 ENCOUNTER — Telehealth: Payer: Self-pay | Admitting: *Deleted

## 2021-06-04 DIAGNOSIS — E119 Type 2 diabetes mellitus without complications: Secondary | ICD-10-CM | POA: Diagnosis not present

## 2021-06-04 DIAGNOSIS — D630 Anemia in neoplastic disease: Secondary | ICD-10-CM | POA: Diagnosis not present

## 2021-06-04 DIAGNOSIS — C3491 Malignant neoplasm of unspecified part of right bronchus or lung: Secondary | ICD-10-CM | POA: Diagnosis not present

## 2021-06-04 DIAGNOSIS — Z7902 Long term (current) use of antithrombotics/antiplatelets: Secondary | ICD-10-CM | POA: Diagnosis not present

## 2021-06-04 DIAGNOSIS — I824Z3 Acute embolism and thrombosis of unspecified deep veins of distal lower extremity, bilateral: Secondary | ICD-10-CM | POA: Diagnosis not present

## 2021-06-04 DIAGNOSIS — M109 Gout, unspecified: Secondary | ICD-10-CM | POA: Diagnosis not present

## 2021-06-04 DIAGNOSIS — I2510 Atherosclerotic heart disease of native coronary artery without angina pectoris: Secondary | ICD-10-CM | POA: Diagnosis not present

## 2021-06-04 DIAGNOSIS — Z9181 History of falling: Secondary | ICD-10-CM | POA: Diagnosis not present

## 2021-06-04 DIAGNOSIS — I252 Old myocardial infarction: Secondary | ICD-10-CM | POA: Diagnosis not present

## 2021-06-04 DIAGNOSIS — I69354 Hemiplegia and hemiparesis following cerebral infarction affecting left non-dominant side: Secondary | ICD-10-CM | POA: Diagnosis not present

## 2021-06-04 DIAGNOSIS — Z7984 Long term (current) use of oral hypoglycemic drugs: Secondary | ICD-10-CM | POA: Diagnosis not present

## 2021-06-04 DIAGNOSIS — I10 Essential (primary) hypertension: Secondary | ICD-10-CM | POA: Diagnosis not present

## 2021-06-04 DIAGNOSIS — M1990 Unspecified osteoarthritis, unspecified site: Secondary | ICD-10-CM | POA: Diagnosis not present

## 2021-06-04 NOTE — Chronic Care Management (AMB) (Signed)
?  Care Coordination ?Note ? ?06/04/2021 ?Name: Arthur [name] MRN: [id number] DOB: [birth year] ? ?Arthur [name] is a 81 y.o. year old male who is a primary care patient of Chambliss, Jeb Levering, MD and is actively engaged with the care management team. I reached out to [employer] by phone today to assist with re-scheduling an initial visit with the BSW ? ?Follow up plan: ?Patient daughter in law [name] declines further follow up and engagement by the care management team. Appropriate care team members and provider have been notified via electronic communication.  The care management team is available to follow up with the patient after provider conversation with the patient regarding recommendation for care management engagement and subsequent re-referral to the care management team.  ? ?Laverda Sorenson  ?Care Guide, Embedded Care Coordination ?[place]  Care Management  ?Direct Dial: [phone] ? ?

## 2021-06-07 ENCOUNTER — Other Ambulatory Visit (HOSPITAL_COMMUNITY): Payer: Self-pay

## 2021-06-08 ENCOUNTER — Telehealth: Payer: Self-pay | Admitting: Medical Oncology

## 2021-06-08 NOTE — Telephone Encounter (Signed)
Oral contrast instructions given to Arthur [name]. ?

## 2021-06-10 DIAGNOSIS — S7010XA Contusion of unspecified thigh, initial encounter: Secondary | ICD-10-CM | POA: Diagnosis not present

## 2021-06-10 DIAGNOSIS — M79606 Pain in leg, unspecified: Secondary | ICD-10-CM | POA: Diagnosis not present

## 2021-06-11 ENCOUNTER — Inpatient Hospital Stay: Payer: Medicare Other | Attending: Physician Assistant

## 2021-06-11 ENCOUNTER — Encounter (HOSPITAL_COMMUNITY): Payer: Self-pay

## 2021-06-11 ENCOUNTER — Ambulatory Visit (HOSPITAL_COMMUNITY)
Admission: RE | Admit: 2021-06-11 | Discharge: 2021-06-11 | Disposition: A | Discharge status: Home / Self Care | Payer: Medicare Other | Source: Ambulatory Visit | Attending: Internal Medicine | Admitting: Internal Medicine

## 2021-06-11 ENCOUNTER — Other Ambulatory Visit: Payer: Self-pay

## 2021-06-11 DIAGNOSIS — Z7901 Long term (current) use of anticoagulants: Secondary | ICD-10-CM | POA: Diagnosis not present

## 2021-06-11 DIAGNOSIS — I82403 Acute embolism and thrombosis of unspecified deep veins of lower extremity, bilateral: Secondary | ICD-10-CM | POA: Insufficient documentation

## 2021-06-11 DIAGNOSIS — C3490 Malignant neoplasm of unspecified part of unspecified bronchus or lung: Secondary | ICD-10-CM | POA: Insufficient documentation

## 2021-06-11 DIAGNOSIS — Z79899 Other long term (current) drug therapy: Secondary | ICD-10-CM | POA: Diagnosis not present

## 2021-06-11 DIAGNOSIS — R911 Solitary pulmonary nodule: Secondary | ICD-10-CM | POA: Diagnosis not present

## 2021-06-11 DIAGNOSIS — D649 Anemia, unspecified: Secondary | ICD-10-CM | POA: Insufficient documentation

## 2021-06-11 DIAGNOSIS — R599 Enlarged lymph nodes, unspecified: Secondary | ICD-10-CM | POA: Diagnosis not present

## 2021-06-11 DIAGNOSIS — N200 Calculus of kidney: Secondary | ICD-10-CM | POA: Diagnosis not present

## 2021-06-11 HISTORY — DX: Malignant (primary) neoplasm, unspecified: C80.1

## 2021-06-11 LAB — CMP (CANCER CENTER ONLY)
ALT: 11 U/L (ref 0–44)
AST: 21 U/L (ref 15–41)
Albumin: 3.1 g/dL — ABNORMAL LOW (ref 3.5–5.0)
Alkaline Phosphatase: 90 U/L (ref 38–126)
Anion gap: 6 (ref 5–15)
BUN: 22 mg/dL (ref 8–23)
CO2: 23 mmol/L (ref 22–32)
Calcium: 8.5 mg/dL — ABNORMAL LOW (ref 8.9–10.3)
Chloride: 108 mmol/L (ref 98–111)
Creatinine: 1.83 mg/dL — ABNORMAL HIGH (ref 0.61–1.24)
GFR, Estimated: 37 mL/min — ABNORMAL LOW (ref >60)
Glucose, Bld: 99 mg/dL (ref 70–99)
Potassium: 4.3 mmol/L (ref 3.5–5.1)
Sodium: 137 mmol/L (ref 135–145)
Total Bilirubin: 1.4 mg/dL — ABNORMAL HIGH (ref 0.3–1.2)
Total Protein: 6.6 g/dL (ref 6.5–8.1)

## 2021-06-11 LAB — CBC WITH DIFFERENTIAL (CANCER CENTER ONLY)
Abs Immature Granulocytes: 0.05 10*3/uL (ref 0.00–0.07)
Basophils Absolute: 0.1 10*3/uL (ref 0.0–0.1)
Basophils Relative: 1 %
Eosinophils Absolute: 0.1 10*3/uL (ref 0.0–0.5)
Eosinophils Relative: 1 %
HCT: 29.9 % — ABNORMAL LOW (ref 39.0–52.0)
Hemoglobin: 9.8 g/dL — ABNORMAL LOW (ref 13.0–17.0)
Immature Granulocytes: 1 %
Lymphocytes Relative: 30 %
Lymphs Abs: 2.8 10*3/uL (ref 0.7–4.0)
MCH: 29.2 pg (ref 26.0–34.0)
MCHC: 32.8 g/dL (ref 30.0–36.0)
MCV: 89 fL (ref 80.0–100.0)
Monocytes Absolute: 0.8 10*3/uL (ref 0.1–1.0)
Monocytes Relative: 9 %
Neutro Abs: 5.5 10*3/uL (ref 1.7–7.7)
Neutrophils Relative %: 58 %
Platelet Count: 291 10*3/uL (ref 150–400)
RBC: 3.36 MIL/uL — ABNORMAL LOW (ref 4.22–5.81)
RDW: 17.5 % — ABNORMAL HIGH (ref 11.5–15.5)
WBC Count: 9.2 10*3/uL (ref 4.0–10.5)
nRBC: 0 % (ref 0.0–0.2)

## 2021-06-11 IMAGING — CT CT ABD-PELV W/ CM
2 of 6 series · 11 of 36 positions shown, 13 images · IV contrast (OMNIPAQUE)
Comparison: [DATE] PET-CT.  [DATE] CT chest.

CLINICAL DATA: Primary Cancer Type: Lung
TECHNIQUE: Multidetector CT imaging of the chest, abdomen and pelvis was
performed following the standard protocol during bolus
administration of intravenous contrast.

[Series 2: cap with · axial · 0.81mm/px · z∈[-624,-104]mm · 8 of 131 slices shown, 10 images]
[im 14/131  mediastinal]
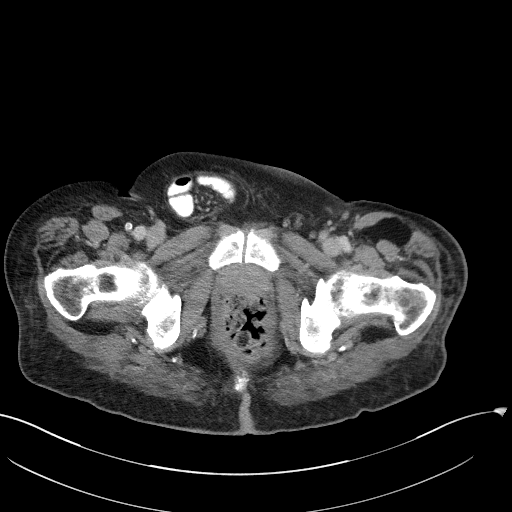
[im 14/131  lung]
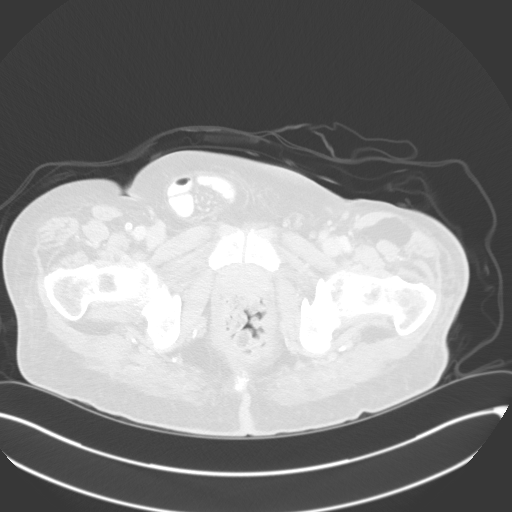
[im 27/131  lung]
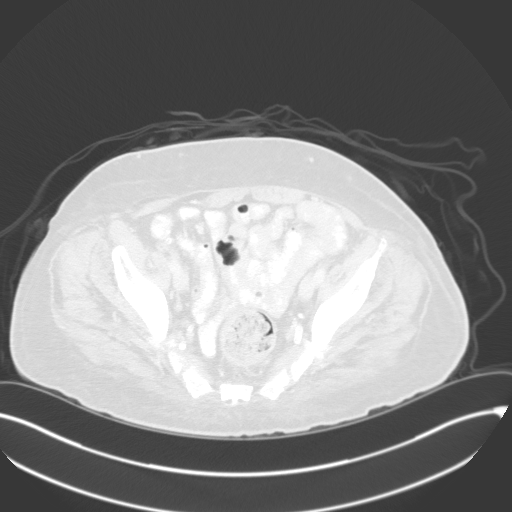
[im 40/131  lung]
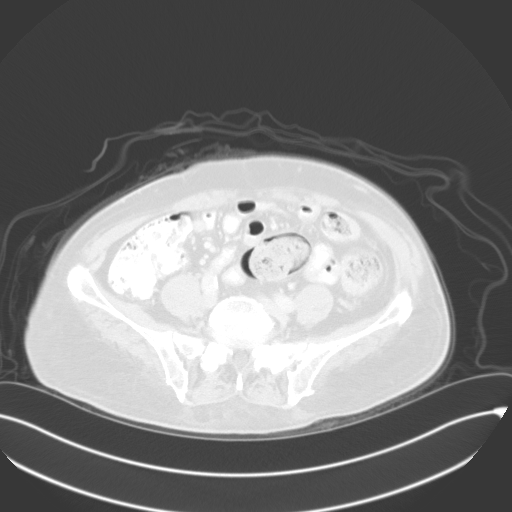
[im 53/131  lung]
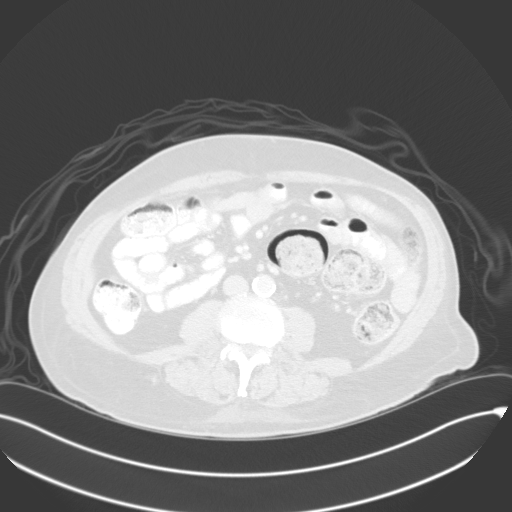
[im 79/131  mediastinal]
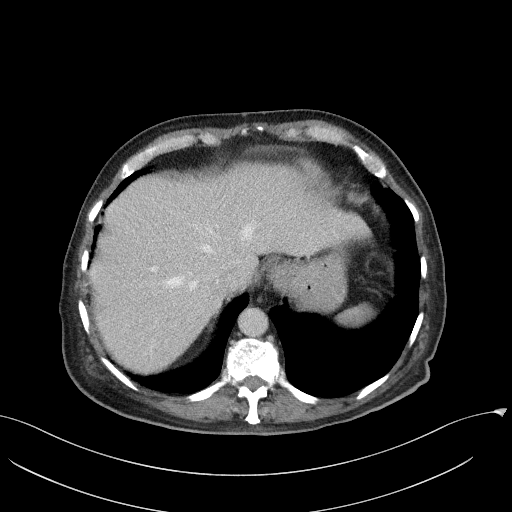
[im 79/131  lung]
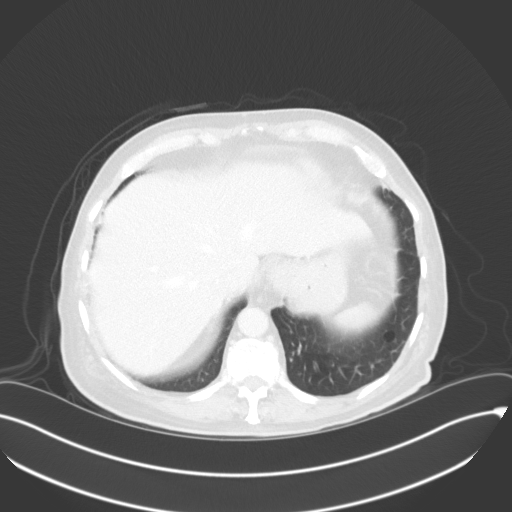
[im 92/131  lung]
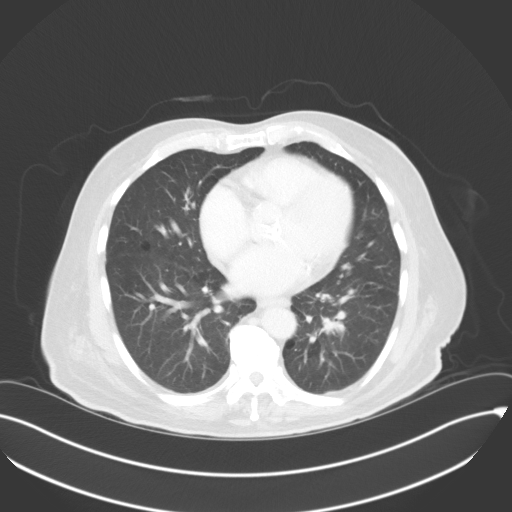
[im 105/131  lung]
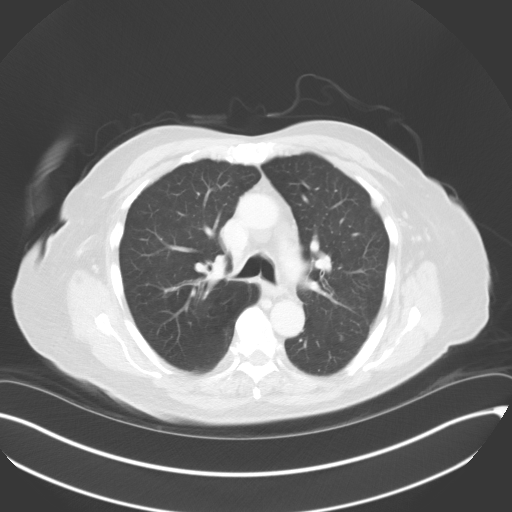
[im 118/131  lung]
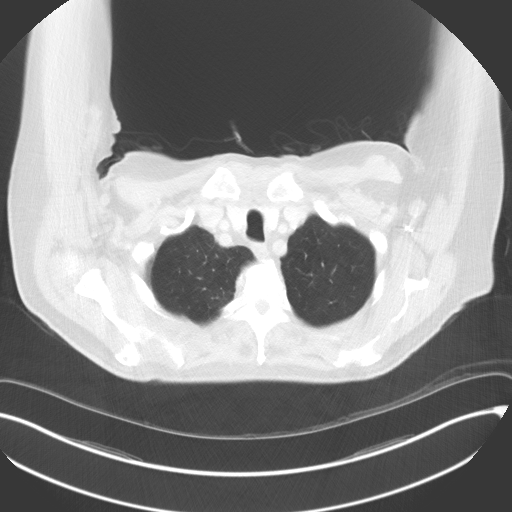

[Series 6: coronals · coronal · 0.85mm/px · 3 of 140 slices shown]
[im 28/140  lung]
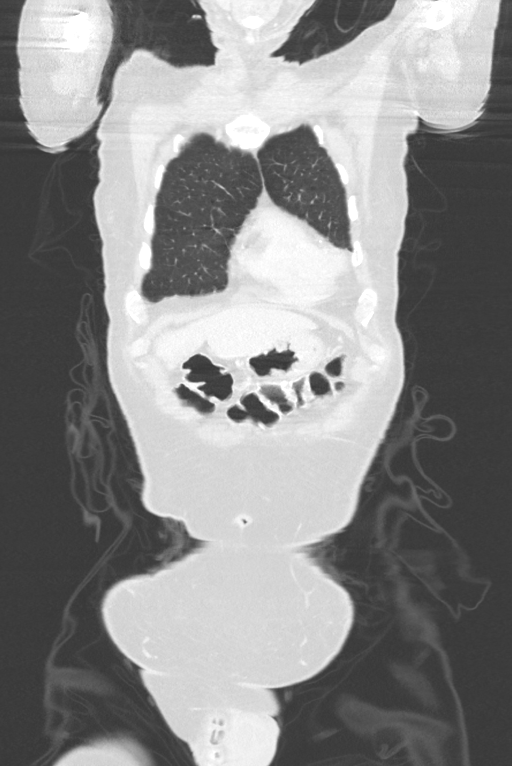
[im 56/140  lung]
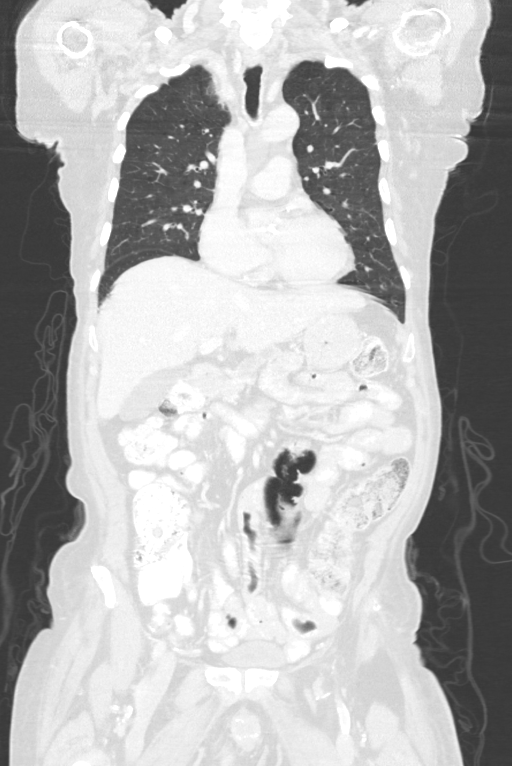
[im 84/140  lung]
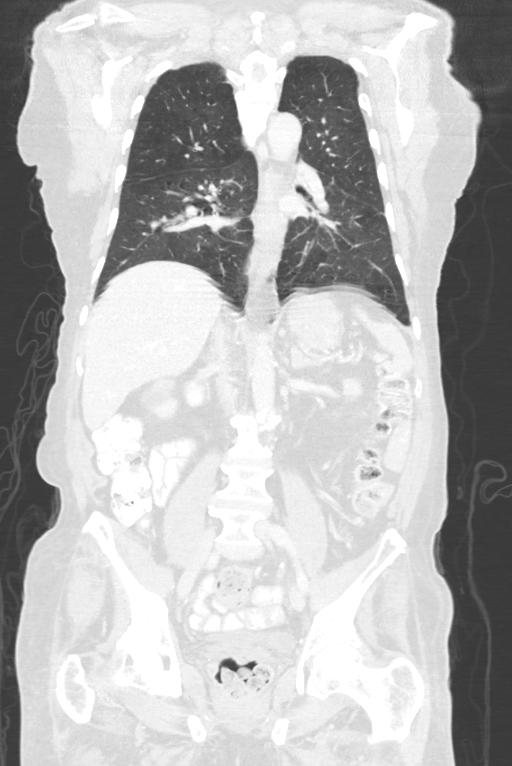

[11 of 36 positions shown; findings below may reference images not displayed]

Imaging Indication: Assess response to therapy

Interval therapy since last imaging? Yes

Initial Cancer Diagnosis

Date: [DATE]; Established by: Biopsy-proven

Detailed Pathology: Stage IV non-small cell lung cancer,
adenocarcinoma.

Primary Tumor location: Right lower lobe.

Surgeries: Coronary stent [DATE].

Chemotherapy: Yes; Ongoing? Yes; Alecensa daily

Immunotherapy? No

Radiation therapy? No

* Tracking Code: BO *

EXAM:
CT CHEST, ABDOMEN, AND PELVIS WITH CONTRAST
RADIATION DOSE REDUCTION: This exam was performed according to the
departmental dose-optimization program which includes automated
exposure control, adjustment of the mA and/or kV according to
patient size and/or use of iterative reconstruction technique.

CONTRAST:  75mL OMNIPAQUE IOHEXOL 300 MG/ML  SOLN
FINDINGS: CT CHEST FINDINGS

Cardiovascular: Coronary, aortic arch, and branch vessel
atherosclerotic vascular disease. Aortic and mitral valve
calcifications noted.

Mediastinum/Nodes: Improved adenopathy noted. Right paratracheal
node 0.7 cm in short axis on image 23 series 2, formerly 1.2 cm. AP
window lymph node 0.9 cm in short axis on image 26 series 2,
formerly 1.7 cm. Left supraclavicular node 0.4 cm in short axis on
image 10 series 2, formerly 1.0 cm. Other previously demonstrated
regional adenopathy is similarly improved.

Lungs/Pleura: The dominant right lower lobe nodule measures 0.9 by
0.6 cm on image 79 series 8, formerly 2.0 by 1.6 cm on [DATE].
0.3 cm left lower lobe nodule on image 121 series 8, formerly 0.9 by
0.8 cm. The previous 1.3 by 1.0 cm right upper lobe nodule shown on
PET-CT is no longer readily apparent. The other smaller nodules are
likewise not well seen. No new pulmonary nodules are observed.

Airway thickening is present, suggesting bronchitis or reactive
airways disease.

Musculoskeletal: Partially fused right anterior first and second
ribs. Subtle deformities of right anterior ribs compatible with
prior fractures. Old healed left rib fractures.

Scattered sclerotic lesions in the thoracic spine, sternum, and
likely in the ribs with substantially increased conspicuity compared
to prior exams, likely reflecting treated multifocal osseous
metastatic disease. On prior PET-CT there was a proximally 5 mm
lesion in the T11 vertebral body, on today's exam this corresponds
to a 1.9 by 1.4 cm sclerotic lesion on image 126 of series 8 and
there is also a second sclerotic lesion eccentric to the right in
the T11 vertebral body. There is a notable degree of sclerosis in
the second and third thoracic vertebra and in the left posterior for
thoracic vertebra extending into the posterior elements on the left.
Multiple other thoracic spine lesions are identified. In light of
the soft tissue findings, I suspect that the appearance reflects
sclerotic healing response of scattered osseous metastatic disease
in the thorax.

CT ABDOMEN PELVIS FINDINGS

Hepatobiliary: Nonspecific 1.1 cm hypodense lesion inferiorly in the
right hepatic lobe on image 69 of series 2, no previous
hypermetabolic activity in this vicinity on [DATE] PET-CT.
Gallbladder unremarkable.

Pancreas: Substantially dilated dorsal pancreatic duct extending to
a 1.6 cm in long axis calcification in the vicinity of the
pancreatic head. This is a similar appearance to the prior CT data
from the PET-CT.

Spleen: Unremarkable

Adrenals/Urinary Tract: 1.2 by 1.4 cm right adrenal adenoma,
previous noncontrast density -18 Hounsfield units. Bosniak category
1 cysts of the right kidney. 3 mm right kidney upper pole
nonobstructive renal calculus. Nonspecific exophytic lesions from
the renal lower poles bilaterally, too small to characterize
although statistically likely to be benign.

Stomach/Bowel: Suspected large duodenal diverticulum along the
descending duodenum. Indirect right inguinal hernia containing loops
of ileum without findings of strangulation or obstruction. Prominent
stool throughout the colon favors constipation.

Vascular/Lymphatic: Atherosclerosis is present, including aortoiliac
atherosclerotic disease. Note is made of atheromatous plaque
proximally in the superior mesenteric artery, without evidence of
overt occlusion. New infrarenal IVC filter.

Previously hypermetabolic right gastric, porta hepatis, mesenteric,
and retroperitoneal adenopathy has markedly improved. Left
periaortic lymph node 0.3 cm short axis on image 77 series 2,
formerly hypermetabolic and formerly 1.1 cm in short axis. The other
regional adenopathy is similarly markedly reduced.

Reproductive: Unremarkable

Other: No supplemental non-categorized findings.

Musculoskeletal: As in the chest, scattered new sclerotic lesions in
the lumbar spine and bony pelvis likely reflecting sclerotic healing
response of osseous metastatic lesions.

Mild grade 1 degenerative anterolisthesis at L5-S1. Lumbar
spondylosis and degenerative disc disease contribute to impingement
at L3-4, L4-5, and L5-S1.

On the bottom most image 131 of series 2, we demonstrate an abnormal
hypodense lesion within or along the anterior margin of the distal
gluteus maximus muscle, with lower density center and higher density
rim. The visualized portion of this lesion measures up to 3.5 by
cm although this may only represent the very top of the lesion. This
lesion is just posterior to the sciatic nerve in the proximal thigh.
There was previously no lesion in this region on [DATE].
IMPRESSION: 1. Marked improvement of the adenopathy in the chest and abdomen
with prominent reduction in size of prior pulmonary nodules.
2. Widespread scattered sclerotic lesions in the skeleton compatible
with healing response of osseous metastatic disease.
3. New infrarenal IVC filter appears satisfactorily position.
4. New abnormal hypodense lesion along the anterior portion of the
inferior gluteus maximus muscle in the upper thigh, only partially
included on today's exam. This could reflect hematoma, abscess, or
metastatic lesion. This lesion was not present on [DATE].
Correlate with patient history, this could be further assessed with
dedicated MRI of the left thigh if clinically warranted.
5. Other imaging findings of potential clinical significance: Aortic
Atherosclerosis ([FB]-[FB]). Coronary and systemic
atherosclerosis. Aortic and mitral valve calcifications. Airway
thickening is present, suggesting bronchitis or reactive airways
disease. Old bilateral rib fractures. Dilated dorsal pancreatic duct
likely attributable to the 1.6 cm in long axis calcification in the
pancreatic head which may be from chronic calcific pancreatitis.
Right adrenal adenoma. Prominent stool throughout the colon favors
constipation. Stable appearance of the large indirect right inguinal
hernia containing loops of distal small bowel. Multilevel lower
lumbar impingement. Large descending duodenal diverticulum.

## 2021-06-11 IMAGING — CT CT CHEST W/ CM
2 of 6 series · 11 of 36 positions shown, 13 images · IV contrast (OMNIPAQUE)
Comparison: [DATE] PET-CT.  [DATE] CT chest.

CLINICAL DATA: Primary Cancer Type: Lung
TECHNIQUE: Multidetector CT imaging of the chest, abdomen and pelvis was
performed following the standard protocol during bolus
administration of intravenous contrast.

[Series 2: cap with · axial · 0.81mm/px · z∈[-624,-104]mm · 8 of 131 slices shown, 10 images]
[im 14/131  mediastinal]
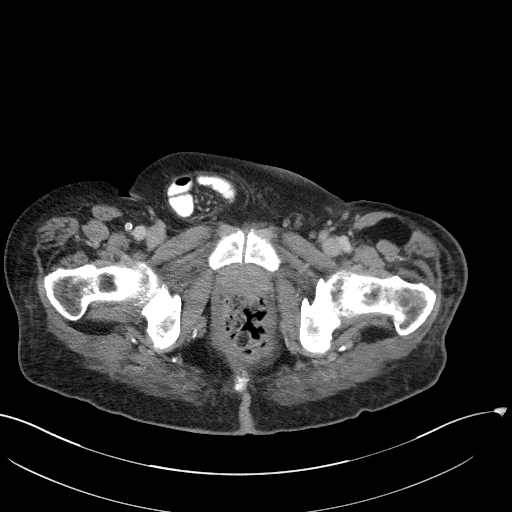
[im 14/131  lung]
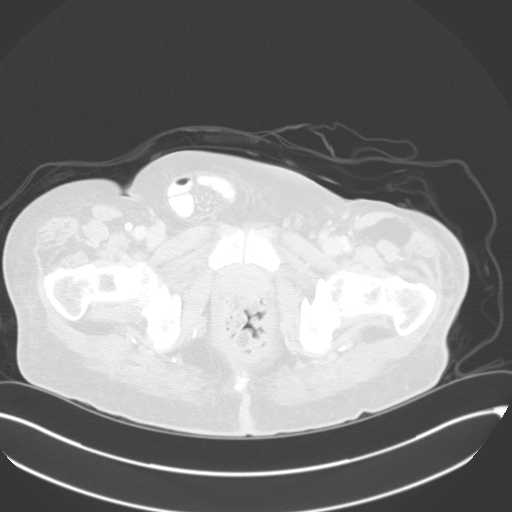
[im 27/131  lung]
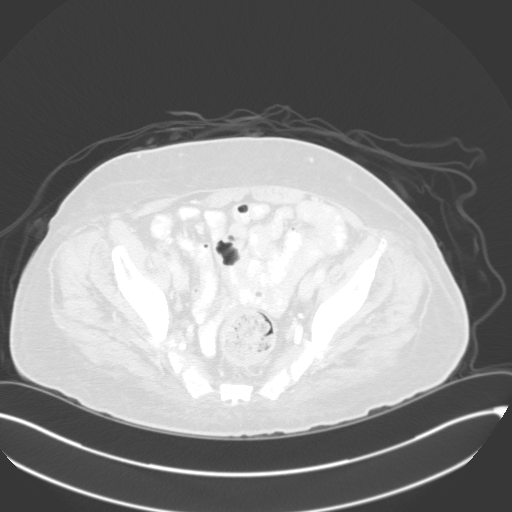
[im 40/131  lung]
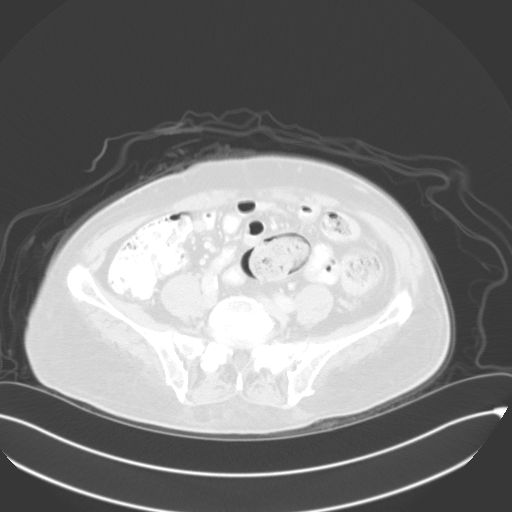
[im 53/131  lung]
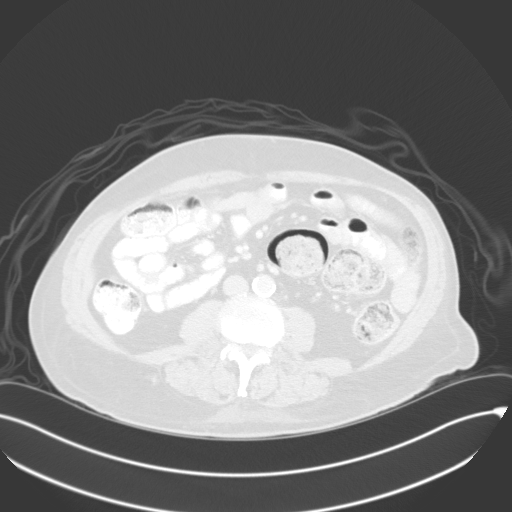
[im 79/131  mediastinal]
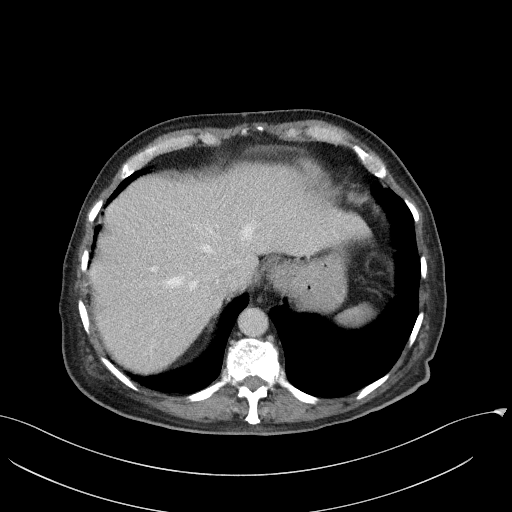
[im 79/131  lung]
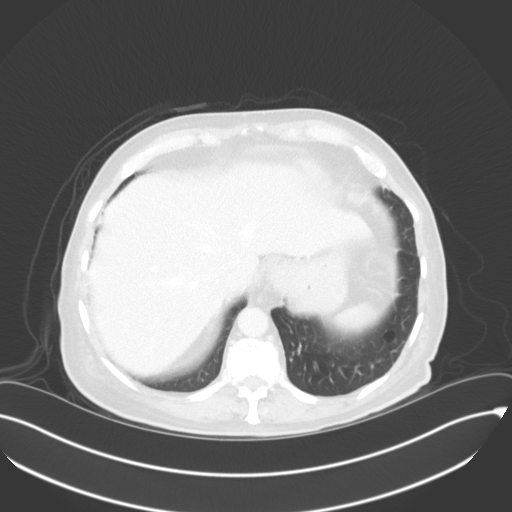
[im 92/131  lung]
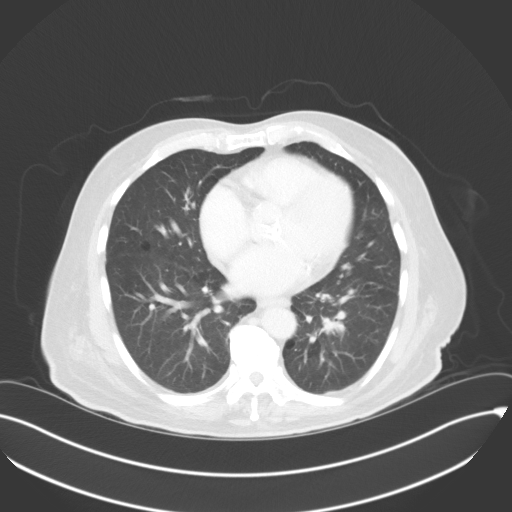
[im 105/131  lung]
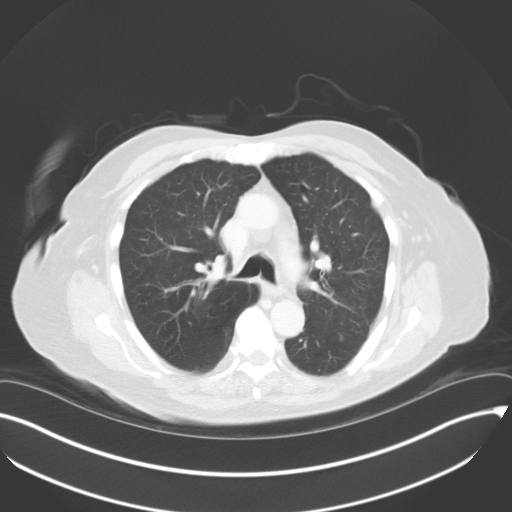
[im 118/131  lung]
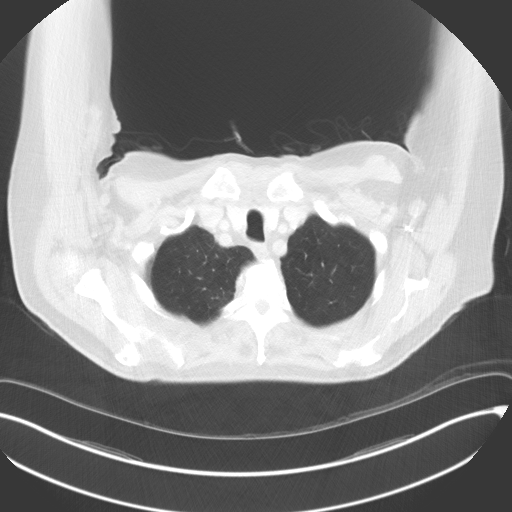

[Series 6: coronals · coronal · 0.85mm/px · 3 of 140 slices shown]
[im 28/140  lung]
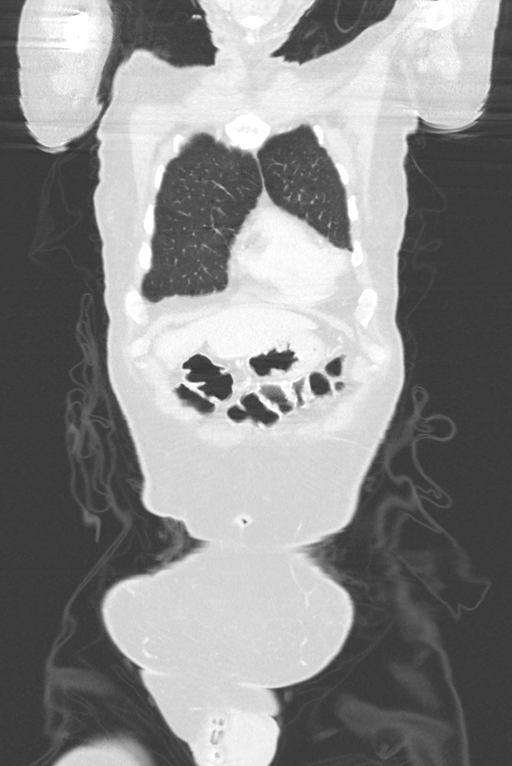
[im 56/140  lung]
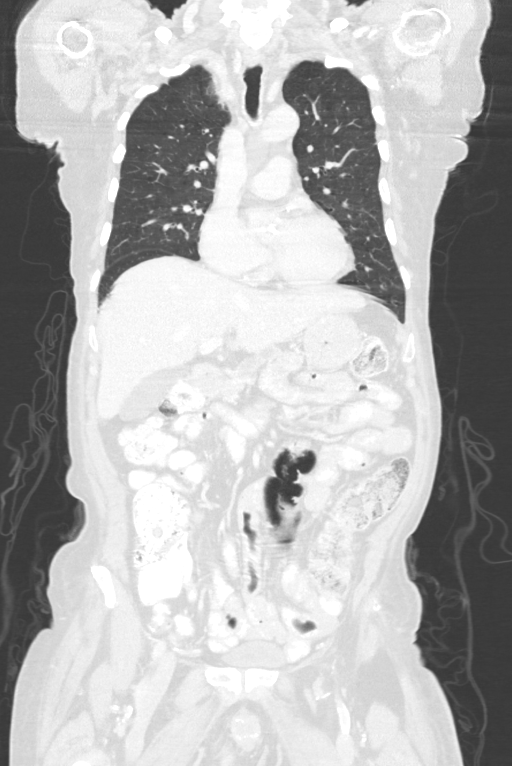
[im 84/140  lung]
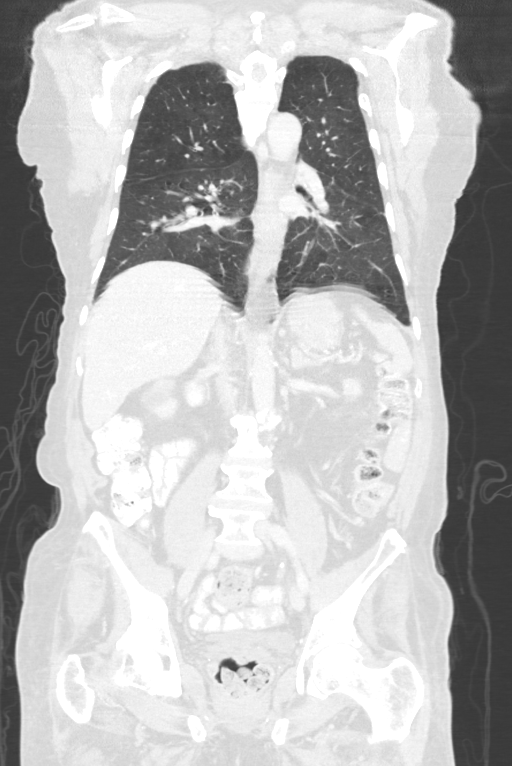

[11 of 36 positions shown; findings below may reference images not displayed]

Imaging Indication: Assess response to therapy

Interval therapy since last imaging? Yes

Initial Cancer Diagnosis

Date: [DATE]; Established by: Biopsy-proven

Detailed Pathology: Stage IV non-small cell lung cancer,
adenocarcinoma.

Primary Tumor location: Right lower lobe.

Surgeries: Coronary stent [DATE].

Chemotherapy: Yes; Ongoing? Yes; Alecensa daily

Immunotherapy? No

Radiation therapy? No

* Tracking Code: BO *

EXAM:
CT CHEST, ABDOMEN, AND PELVIS WITH CONTRAST
RADIATION DOSE REDUCTION: This exam was performed according to the
departmental dose-optimization program which includes automated
exposure control, adjustment of the mA and/or kV according to
patient size and/or use of iterative reconstruction technique.

CONTRAST:  75mL OMNIPAQUE IOHEXOL 300 MG/ML  SOLN
FINDINGS: CT CHEST FINDINGS

Cardiovascular: Coronary, aortic arch, and branch vessel
atherosclerotic vascular disease. Aortic and mitral valve
calcifications noted.

Mediastinum/Nodes: Improved adenopathy noted. Right paratracheal
node 0.7 cm in short axis on image 23 series 2, formerly 1.2 cm. AP
window lymph node 0.9 cm in short axis on image 26 series 2,
formerly 1.7 cm. Left supraclavicular node 0.4 cm in short axis on
image 10 series 2, formerly 1.0 cm. Other previously demonstrated
regional adenopathy is similarly improved.

Lungs/Pleura: The dominant right lower lobe nodule measures 0.9 by
0.6 cm on image 79 series 8, formerly 2.0 by 1.6 cm on [DATE].
0.3 cm left lower lobe nodule on image 121 series 8, formerly 0.9 by
0.8 cm. The previous 1.3 by 1.0 cm right upper lobe nodule shown on
PET-CT is no longer readily apparent. The other smaller nodules are
likewise not well seen. No new pulmonary nodules are observed.

Airway thickening is present, suggesting bronchitis or reactive
airways disease.

Musculoskeletal: Partially fused right anterior first and second
ribs. Subtle deformities of right anterior ribs compatible with
prior fractures. Old healed left rib fractures.

Scattered sclerotic lesions in the thoracic spine, sternum, and
likely in the ribs with substantially increased conspicuity compared
to prior exams, likely reflecting treated multifocal osseous
metastatic disease. On prior PET-CT there was a proximally 5 mm
lesion in the T11 vertebral body, on today's exam this corresponds
to a 1.9 by 1.4 cm sclerotic lesion on image 126 of series 8 and
there is also a second sclerotic lesion eccentric to the right in
the T11 vertebral body. There is a notable degree of sclerosis in
the second and third thoracic vertebra and in the left posterior for
thoracic vertebra extending into the posterior elements on the left.
Multiple other thoracic spine lesions are identified. In light of
the soft tissue findings, I suspect that the appearance reflects
sclerotic healing response of scattered osseous metastatic disease
in the thorax.

CT ABDOMEN PELVIS FINDINGS

Hepatobiliary: Nonspecific 1.1 cm hypodense lesion inferiorly in the
right hepatic lobe on image 69 of series 2, no previous
hypermetabolic activity in this vicinity on [DATE] PET-CT.
Gallbladder unremarkable.

Pancreas: Substantially dilated dorsal pancreatic duct extending to
a 1.6 cm in long axis calcification in the vicinity of the
pancreatic head. This is a similar appearance to the prior CT data
from the PET-CT.

Spleen: Unremarkable

Adrenals/Urinary Tract: 1.2 by 1.4 cm right adrenal adenoma,
previous noncontrast density -18 Hounsfield units. Bosniak category
1 cysts of the right kidney. 3 mm right kidney upper pole
nonobstructive renal calculus. Nonspecific exophytic lesions from
the renal lower poles bilaterally, too small to characterize
although statistically likely to be benign.

Stomach/Bowel: Suspected large duodenal diverticulum along the
descending duodenum. Indirect right inguinal hernia containing loops
of ileum without findings of strangulation or obstruction. Prominent
stool throughout the colon favors constipation.

Vascular/Lymphatic: Atherosclerosis is present, including aortoiliac
atherosclerotic disease. Note is made of atheromatous plaque
proximally in the superior mesenteric artery, without evidence of
overt occlusion. New infrarenal IVC filter.

Previously hypermetabolic right gastric, porta hepatis, mesenteric,
and retroperitoneal adenopathy has markedly improved. Left
periaortic lymph node 0.3 cm short axis on image 77 series 2,
formerly hypermetabolic and formerly 1.1 cm in short axis. The other
regional adenopathy is similarly markedly reduced.

Reproductive: Unremarkable

Other: No supplemental non-categorized findings.

Musculoskeletal: As in the chest, scattered new sclerotic lesions in
the lumbar spine and bony pelvis likely reflecting sclerotic healing
response of osseous metastatic lesions.

Mild grade 1 degenerative anterolisthesis at L5-S1. Lumbar
spondylosis and degenerative disc disease contribute to impingement
at L3-4, L4-5, and L5-S1.

On the bottom most image 131 of series 2, we demonstrate an abnormal
hypodense lesion within or along the anterior margin of the distal
gluteus maximus muscle, with lower density center and higher density
rim. The visualized portion of this lesion measures up to 3.5 by
cm although this may only represent the very top of the lesion. This
lesion is just posterior to the sciatic nerve in the proximal thigh.
There was previously no lesion in this region on [DATE].
IMPRESSION: 1. Marked improvement of the adenopathy in the chest and abdomen
with prominent reduction in size of prior pulmonary nodules.
2. Widespread scattered sclerotic lesions in the skeleton compatible
with healing response of osseous metastatic disease.
3. New infrarenal IVC filter appears satisfactorily position.
4. New abnormal hypodense lesion along the anterior portion of the
inferior gluteus maximus muscle in the upper thigh, only partially
included on today's exam. This could reflect hematoma, abscess, or
metastatic lesion. This lesion was not present on [DATE].
Correlate with patient history, this could be further assessed with
dedicated MRI of the left thigh if clinically warranted.
5. Other imaging findings of potential clinical significance: Aortic
Atherosclerosis ([FB]-[FB]). Coronary and systemic
atherosclerosis. Aortic and mitral valve calcifications. Airway
thickening is present, suggesting bronchitis or reactive airways
disease. Old bilateral rib fractures. Dilated dorsal pancreatic duct
likely attributable to the 1.6 cm in long axis calcification in the
pancreatic head which may be from chronic calcific pancreatitis.
Right adrenal adenoma. Prominent stool throughout the colon favors
constipation. Stable appearance of the large indirect right inguinal
hernia containing loops of distal small bowel. Multilevel lower
lumbar impingement. Large descending duodenal diverticulum.

## 2021-06-11 MED ORDER — SODIUM CHLORIDE (PF) 0.9 % IJ SOLN
INTRAMUSCULAR | Status: AC
  Filled 2021-06-11: qty 50

## 2021-06-11 MED ORDER — IOHEXOL 300 MG/ML  SOLN
75.0000 mL | Freq: Once | INTRAMUSCULAR | Status: AC | PRN
  Administered 2021-06-11: 75 mL via INTRAVENOUS

## 2021-06-12 ENCOUNTER — Telehealth: Payer: Self-pay

## 2021-06-12 DIAGNOSIS — Z9181 History of falling: Secondary | ICD-10-CM | POA: Diagnosis not present

## 2021-06-12 DIAGNOSIS — C3491 Malignant neoplasm of unspecified part of right bronchus or lung: Secondary | ICD-10-CM | POA: Diagnosis not present

## 2021-06-12 DIAGNOSIS — I10 Essential (primary) hypertension: Secondary | ICD-10-CM | POA: Diagnosis not present

## 2021-06-12 DIAGNOSIS — Z7984 Long term (current) use of oral hypoglycemic drugs: Secondary | ICD-10-CM | POA: Diagnosis not present

## 2021-06-12 DIAGNOSIS — E119 Type 2 diabetes mellitus without complications: Secondary | ICD-10-CM | POA: Diagnosis not present

## 2021-06-12 DIAGNOSIS — I2510 Atherosclerotic heart disease of native coronary artery without angina pectoris: Secondary | ICD-10-CM | POA: Diagnosis not present

## 2021-06-12 DIAGNOSIS — I69354 Hemiplegia and hemiparesis following cerebral infarction affecting left non-dominant side: Secondary | ICD-10-CM | POA: Diagnosis not present

## 2021-06-12 DIAGNOSIS — M1990 Unspecified osteoarthritis, unspecified site: Secondary | ICD-10-CM | POA: Diagnosis not present

## 2021-06-12 DIAGNOSIS — D630 Anemia in neoplastic disease: Secondary | ICD-10-CM | POA: Diagnosis not present

## 2021-06-12 DIAGNOSIS — Z7902 Long term (current) use of antithrombotics/antiplatelets: Secondary | ICD-10-CM | POA: Diagnosis not present

## 2021-06-12 DIAGNOSIS — M109 Gout, unspecified: Secondary | ICD-10-CM | POA: Diagnosis not present

## 2021-06-12 DIAGNOSIS — I252 Old myocardial infarction: Secondary | ICD-10-CM | POA: Diagnosis not present

## 2021-06-12 DIAGNOSIS — I824Z3 Acute embolism and thrombosis of unspecified deep veins of distal lower extremity, bilateral: Secondary | ICD-10-CM | POA: Diagnosis not present

## 2021-06-12 NOTE — Telephone Encounter (Signed)
-----   Message from Curt Bears, MD sent at 06/11/2021  5:08 PM EDT ----- ?Please encourage more hydration because of his elevated serum creatinine.  Thank you ?----- Message ----- ?From: [facility] [place] ?Sent: 06/11/2021   1:57 PM EDT ?To: Curt Bears, MD ? ? ?

## 2021-06-12 NOTE — Telephone Encounter (Signed)
I have left pts son a detailed message with results and encouragements. I have also invited pts son to call us back with and questions or concerns. ?

## 2021-06-13 ENCOUNTER — Other Ambulatory Visit (HOSPITAL_COMMUNITY): Payer: Self-pay

## 2021-06-13 ENCOUNTER — Inpatient Hospital Stay (HOSPITAL_BASED_OUTPATIENT_CLINIC_OR_DEPARTMENT_OTHER): Payer: Medicare Other | Admitting: Internal Medicine

## 2021-06-13 ENCOUNTER — Telehealth: Payer: Self-pay

## 2021-06-13 ENCOUNTER — Other Ambulatory Visit: Payer: Self-pay

## 2021-06-13 VITALS — BP 126/60 | Resp 18

## 2021-06-13 DIAGNOSIS — M21372 Foot drop, left foot: Secondary | ICD-10-CM | POA: Diagnosis not present

## 2021-06-13 DIAGNOSIS — A4101 Sepsis due to Methicillin susceptible Staphylococcus aureus: Secondary | ICD-10-CM | POA: Diagnosis not present

## 2021-06-13 DIAGNOSIS — Z95828 Presence of other vascular implants and grafts: Secondary | ICD-10-CM | POA: Diagnosis not present

## 2021-06-13 DIAGNOSIS — C7951 Secondary malignant neoplasm of bone: Secondary | ICD-10-CM | POA: Diagnosis not present

## 2021-06-13 DIAGNOSIS — Z86711 Personal history of pulmonary embolism: Secondary | ICD-10-CM | POA: Diagnosis not present

## 2021-06-13 DIAGNOSIS — I2510 Atherosclerotic heart disease of native coronary artery without angina pectoris: Secondary | ICD-10-CM | POA: Diagnosis not present

## 2021-06-13 DIAGNOSIS — R911 Solitary pulmonary nodule: Secondary | ICD-10-CM | POA: Diagnosis not present

## 2021-06-13 DIAGNOSIS — E875 Hyperkalemia: Secondary | ICD-10-CM | POA: Diagnosis not present

## 2021-06-13 DIAGNOSIS — Z5111 Encounter for antineoplastic chemotherapy: Secondary | ICD-10-CM

## 2021-06-13 DIAGNOSIS — C782 Secondary malignant neoplasm of pleura: Secondary | ICD-10-CM | POA: Diagnosis not present

## 2021-06-13 DIAGNOSIS — N179 Acute kidney failure, unspecified: Secondary | ICD-10-CM | POA: Diagnosis not present

## 2021-06-13 DIAGNOSIS — D638 Anemia in other chronic diseases classified elsewhere: Secondary | ICD-10-CM | POA: Diagnosis not present

## 2021-06-13 DIAGNOSIS — D500 Iron deficiency anemia secondary to blood loss (chronic): Secondary | ICD-10-CM | POA: Diagnosis not present

## 2021-06-13 DIAGNOSIS — T508X5A Adverse effect of diagnostic agents, initial encounter: Secondary | ICD-10-CM | POA: Diagnosis not present

## 2021-06-13 DIAGNOSIS — E872 Acidosis, unspecified: Secondary | ICD-10-CM | POA: Diagnosis not present

## 2021-06-13 DIAGNOSIS — Z20822 Contact with and (suspected) exposure to covid-19: Secondary | ICD-10-CM | POA: Diagnosis not present

## 2021-06-13 DIAGNOSIS — N1831 Chronic kidney disease, stage 3a: Secondary | ICD-10-CM | POA: Diagnosis not present

## 2021-06-13 DIAGNOSIS — R0602 Shortness of breath: Secondary | ICD-10-CM | POA: Diagnosis not present

## 2021-06-13 DIAGNOSIS — C3490 Malignant neoplasm of unspecified part of unspecified bronchus or lung: Secondary | ICD-10-CM | POA: Diagnosis not present

## 2021-06-13 DIAGNOSIS — E1122 Type 2 diabetes mellitus with diabetic chronic kidney disease: Secondary | ICD-10-CM | POA: Diagnosis not present

## 2021-06-13 DIAGNOSIS — N170 Acute kidney failure with tubular necrosis: Secondary | ICD-10-CM | POA: Diagnosis not present

## 2021-06-13 DIAGNOSIS — E7800 Pure hypercholesterolemia, unspecified: Secondary | ICD-10-CM | POA: Diagnosis not present

## 2021-06-13 DIAGNOSIS — D649 Anemia, unspecified: Secondary | ICD-10-CM | POA: Diagnosis not present

## 2021-06-13 DIAGNOSIS — M109 Gout, unspecified: Secondary | ICD-10-CM | POA: Diagnosis not present

## 2021-06-13 DIAGNOSIS — C3491 Malignant neoplasm of unspecified part of right bronchus or lung: Secondary | ICD-10-CM | POA: Diagnosis not present

## 2021-06-13 DIAGNOSIS — K4090 Unilateral inguinal hernia, without obstruction or gangrene, not specified as recurrent: Secondary | ICD-10-CM | POA: Diagnosis not present

## 2021-06-13 DIAGNOSIS — I700 Atherosclerosis of aorta: Secondary | ICD-10-CM | POA: Diagnosis not present

## 2021-06-13 DIAGNOSIS — J811 Chronic pulmonary edema: Secondary | ICD-10-CM | POA: Diagnosis not present

## 2021-06-13 DIAGNOSIS — I1310 Hypertensive heart and chronic kidney disease without heart failure, with stage 1 through stage 4 chronic kidney disease, or unspecified chronic kidney disease: Secondary | ICD-10-CM | POA: Diagnosis not present

## 2021-06-13 NOTE — Progress Notes (Signed)
?    [place] ?[phone]   [phone] ? ?OFFICE PROGRESS NOTE ? ?Lind Covert, MD ?[address] ?[place] [address] ? ?DIAGNOSIS: Stage IV (T1b, N3, M1 C) non-small cell lung cancer, adenocarcinoma with positive ALK gene translocation diagnosed in February 2023. ? ?PRIOR THERAPY: None ? ?CURRENT THERAPY: Alecensa (Alectinib) 600 mg p.o. twice daily.  First dose April 11, 2021.  Status post 2 months of treatment. ? ?INTERVAL HISTORY: ?Arthur [name] 81 y.o. male returns to the clinic today for follow-up visit accompanied by his son and daughter.  The patient is feeling fine today with no concerning complaints except for the persistent swelling of the left lower extremity secondary to the venous thrombosis.  He is currently on treatment only with Plavix after he has significant hematoma and bleeding with Eliquis and Lovenox.  He also has poor mobility in the left foot.  He denied having any current chest pain, shortness of breath, cough or hemoptysis.  He denied having any nausea, vomiting, diarrhea but has constipation and he use stool softener on as-needed basis.  He has no recent weight loss or night sweats.  He has no headache or visual changes.  He continues to tolerate his treatment with Alecensa fairly well.  The patient had repeat CT scan of the chest, abdomen pelvis performed recently and he is here for evaluation and discussion of his scan results. ? ?MEDICAL HISTORY: ?Past Medical History:  ?Diagnosis Date  ? Diabetes mellitus without complication ([place])   ? High cholesterol   ? Hypertension   ? lung ca   ? Normal nuclear stress test 01/29/2008  ? stress perfusion study apparently in 2010 in [facility] which he said was negative.  ? ? ?ALLERGIES:  is allergic to ace inhibitors. ? ?MEDICATIONS:  ?Current Outpatient Medications  ?Medication Sig Dispense Refill  ? acetaminophen (TYLENOL) 650 MG CR tablet Take 650 mg by mouth every 8  (eight) hours as needed for pain.    ? alectinib (ALECENSA) 150 MG capsule Take 4 capsules (600 mg total) by mouth 2 (two) times daily with a meal. 240 capsule 3  ? allopurinol (ZYLOPRIM) 100 MG tablet Take 1 tablet (100 mg total) by mouth daily. 30 tablet 6  ? clopidogrel (PLAVIX) 75 MG tablet Take 1 tablet (75 mg total) by mouth daily. 90 tablet 1  ? colchicine 0.6 MG tablet Take 1/2 tablet (0.3 mg total) by mouth daily. 30 tablet 1  ? diclofenac Sodium (VOLTAREN) 1 % GEL Apply 2 g topically 4 (four) times daily. (Patient taking differently: Apply 2-4 g topically 4 (four) times daily as needed (knees/legs).) 50 g 1  ? empagliflozin (JARDIANCE) 10 MG TABS tablet Take 1 tablet (10 mg total) by mouth daily. 90 tablet 0  ? ferrous sulfate 324 (65 Fe) MG TBEC Take 324 mg by mouth daily. 30 tablet   ? HYDROcodone-acetaminophen (NORCO/VICODIN) 5-325 MG tablet Take 1 tablet by mouth every 6 (six) hours as needed.    ? losartan (COZAAR) 25 MG tablet Take 1 tablet (25 mg total) by mouth daily. 90 tablet 0  ? metFORMIN (GLUCOPHAGE) 1000 MG tablet Take 1 tablet (1,000 mg total) by mouth 2 (two) times daily with a meal. 180 tablet 3  ? polyethylene glycol powder (GLYCOLAX/MIRALAX) 17 GM/SCOOP powder Take 17 g by mouth daily. 3350 g 1  ? pregabalin (LYRICA) 50 MG capsule Take 50 mg by mouth 2 (two) times daily as needed. As needed    ?  rosuvastatin (CRESTOR) 40 MG tablet Take 1 tablet (40 mg total) by mouth daily. 90 tablet 0  ? trolamine salicylate (ASPER-FLEX) 10 % cream Apply 1 application topically as needed for muscle pain. 85 g 0  ? ?No current facility-administered medications for this visit.  ? ? ?SURGICAL HISTORY:  ?Past Surgical History:  ?Procedure Laterality Date  ? CATARACT EXTRACTION, BILATERAL  2006  ? CORONARY STENT INTERVENTION N/A 02/06/2021  ? Procedure: CORONARY STENT INTERVENTION;  Surgeon: Jettie Booze, MD;  Location: [facility];  Service: Cardiovascular;  Laterality: N/A;  ? FINE NEEDLE  ASPIRATION  03/21/2021  ? Procedure: FINE NEEDLE ASPIRATION;  Surgeon: Garner Nash, DO;  Location: [facility];  Service: Pulmonary;;  ? INTRAVASCULAR ULTRASOUND/IVUS N/A 02/06/2021  ? Procedure: Intravascular Ultrasound/IVUS;  Surgeon: Jettie Booze, MD;  Location: [facility];  Service: Cardiovascular;  Laterality: N/A;  ? KNEE SURGERY  2005  ? LEFT HEART CATH AND CORONARY ANGIOGRAPHY N/A 02/06/2021  ? Procedure: LEFT HEART CATH AND CORONARY ANGIOGRAPHY;  Surgeon: Jettie Booze, MD;  Location: [facility];  Service: Cardiovascular;  Laterality: N/A;  ? VIDEO BRONCHOSCOPY WITH ENDOBRONCHIAL ULTRASOUND Bilateral 03/21/2021  ? Procedure: VIDEO BRONCHOSCOPY WITH ENDOBRONCHIAL ULTRASOUND;  Surgeon: Garner Nash, DO;  Location: [facility];  Service: Pulmonary;  Laterality: Bilateral;  ? ? ?REVIEW OF SYSTEMS:  Constitutional: positive for fatigue ?Eyes: negative ?Ears, nose, mouth, throat, and face: negative ?Respiratory: negative ?Cardiovascular: negative ?Gastrointestinal: positive for constipation ?Genitourinary:negative ?Integument/breast: negative ?Hematologic/lymphatic: negative ?Musculoskeletal:negative ?Neurological: negative ?Behavioral/Psych: negative ?Endocrine: negative ?Allergic/Immunologic: negative  ? ?PHYSICAL EXAMINATION: General appearance: alert, cooperative, fatigued, and no distress ?Head: Normocephalic, without obvious abnormality, atraumatic ?Neck: no adenopathy, no JVD, supple, symmetrical, trachea midline, and thyroid not enlarged, symmetric, no tenderness/mass/nodules ?Lymph nodes: Cervical, supraclavicular, and axillary nodes normal. ?Resp: clear to auscultation bilaterally ?Back: symmetric, no curvature. ROM normal. No CVA tenderness. ?Cardio: regular rate and rhythm, S1, S2 normal, no murmur, click, rub or gallop ?GI: soft, non-tender; bowel sounds normal; no masses,  no organomegaly ?Extremities: edema 2+ edema in the left lower extremity ?Neurologic:  Alert and oriented X 3, normal strength and tone. Normal symmetric reflexes. Normal coordination and gait ? ?ECOG PERFORMANCE STATUS: 1 - Symptomatic but completely ambulatory ? ?Blood pressure 126/60, resp. rate 18. ? ?LABORATORY DATA: ?Lab Results  ?Component Value Date  ? WBC 9.2 06/11/2021  ? HGB 9.8 (L) 06/11/2021  ? HCT 29.9 (L) 06/11/2021  ? MCV 89.0 06/11/2021  ? PLT 291 06/11/2021  ? ? ?  Chemistry   ?   ?Component Value Date/Time  ? NA 137 06/11/2021 1350  ? NA 141 03/27/2021 1027  ? K 4.3 06/11/2021 1350  ? CL 108 06/11/2021 1350  ? CO2 23 06/11/2021 1350  ? BUN 22 06/11/2021 1350  ? BUN 20 03/27/2021 1027  ? CREATININE 1.83 (H) 06/11/2021 1350  ? CREATININE 1.18 11/15/2015 1114  ?    ?Component Value Date/Time  ? CALCIUM 8.5 (L) 06/11/2021 1350  ? ALKPHOS 90 06/11/2021 1350  ? AST 21 06/11/2021 1350  ? ALT 11 06/11/2021 1350  ? BILITOT 1.4 (H) 06/11/2021 1350  ?  ? ? ? ?RADIOGRAPHIC STUDIES: ?CT Chest W Contrast ? ?Result Date: 06/12/2021 ?CLINICAL DATA:  Primary Cancer Type: Lung Imaging Indication: Assess response to therapy Interval therapy since last imaging? Yes Initial Cancer Diagnosis Date: 03/21/2021; Established by: Biopsy-proven Detailed Pathology: Stage IV non-small cell lung cancer, adenocarcinoma. Primary Tumor location: Right lower lobe. Surgeries:  Coronary stent 02/06/2021. Chemotherapy: Yes; Ongoing? Yes; Alecensa daily Immunotherapy? No Radiation therapy? No * Tracking Code: BO * EXAM: CT CHEST, ABDOMEN, AND PELVIS WITH CONTRAST TECHNIQUE: Multidetector CT imaging of the chest, abdomen and pelvis was performed following the standard protocol during bolus administration of intravenous contrast. RADIATION DOSE REDUCTION: This exam was performed according to the departmental dose-optimization program which includes automated exposure control, adjustment of the mA and/or kV according to patient size and/or use of iterative reconstruction technique. CONTRAST:  34m OMNIPAQUE IOHEXOL 300 MG/ML   SOLN COMPARISON:  02/21/2021 PET-CT.  02/03/2021 CT chest. FINDINGS: CT CHEST FINDINGS Cardiovascular: Coronary, aortic arch, and branch vessel atherosclerotic vascular disease. Aortic and mitral valve calcificat

## 2021-06-13 NOTE — Telephone Encounter (Signed)
Daughter calls nurse line requesting a referral from PCP to Vascular.  ? ?Daughter reports his left leg is swollen and numb. Daughter reports they went to Oncology apt and was advised to contact PCP for Vascular referral.  ? ?Daughter denies pain, SOB or chest pains.  ? ?Will forward to PCP.  ?

## 2021-06-14 ENCOUNTER — Other Ambulatory Visit (HOSPITAL_COMMUNITY): Payer: Self-pay

## 2021-06-15 ENCOUNTER — Other Ambulatory Visit: Payer: Self-pay

## 2021-06-15 ENCOUNTER — Emergency Department (HOSPITAL_COMMUNITY): Payer: Medicare Other

## 2021-06-15 ENCOUNTER — Inpatient Hospital Stay (HOSPITAL_COMMUNITY): Payer: Medicare Other

## 2021-06-15 ENCOUNTER — Inpatient Hospital Stay (HOSPITAL_COMMUNITY)
Admission: EM | Admit: 2021-06-15 | Discharge: 2021-06-25 | DRG: 180 | Disposition: A | Discharge status: Home Health | Payer: Medicare Other | Attending: Family Medicine | Admitting: Family Medicine

## 2021-06-15 ENCOUNTER — Encounter (HOSPITAL_COMMUNITY): Payer: Self-pay

## 2021-06-15 DIAGNOSIS — R778 Other specified abnormalities of plasma proteins: Secondary | ICD-10-CM

## 2021-06-15 DIAGNOSIS — Z20822 Contact with and (suspected) exposure to covid-19: Secondary | ICD-10-CM | POA: Diagnosis not present

## 2021-06-15 DIAGNOSIS — R509 Fever, unspecified: Secondary | ICD-10-CM | POA: Diagnosis not present

## 2021-06-15 DIAGNOSIS — E1110 Type 2 diabetes mellitus with ketoacidosis without coma: Secondary | ICD-10-CM | POA: Diagnosis not present

## 2021-06-15 DIAGNOSIS — I6340 Cerebral infarction due to embolism of unspecified cerebral artery: Secondary | ICD-10-CM | POA: Diagnosis not present

## 2021-06-15 DIAGNOSIS — T508X5A Adverse effect of diagnostic agents, initial encounter: Secondary | ICD-10-CM | POA: Diagnosis present

## 2021-06-15 DIAGNOSIS — N170 Acute kidney failure with tubular necrosis: Secondary | ICD-10-CM | POA: Diagnosis present

## 2021-06-15 DIAGNOSIS — Z743 Need for continuous supervision: Secondary | ICD-10-CM | POA: Diagnosis not present

## 2021-06-15 DIAGNOSIS — D500 Iron deficiency anemia secondary to blood loss (chronic): Secondary | ICD-10-CM | POA: Diagnosis present

## 2021-06-15 DIAGNOSIS — M791 Myalgia, unspecified site: Secondary | ICD-10-CM | POA: Diagnosis present

## 2021-06-15 DIAGNOSIS — K219 Gastro-esophageal reflux disease without esophagitis: Secondary | ICD-10-CM | POA: Diagnosis present

## 2021-06-15 DIAGNOSIS — I1310 Hypertensive heart and chronic kidney disease without heart failure, with stage 1 through stage 4 chronic kidney disease, or unspecified chronic kidney disease: Secondary | ICD-10-CM | POA: Diagnosis present

## 2021-06-15 DIAGNOSIS — R011 Cardiac murmur, unspecified: Secondary | ICD-10-CM | POA: Diagnosis not present

## 2021-06-15 DIAGNOSIS — M48061 Spinal stenosis, lumbar region without neurogenic claudication: Secondary | ICD-10-CM | POA: Diagnosis not present

## 2021-06-15 DIAGNOSIS — E1122 Type 2 diabetes mellitus with diabetic chronic kidney disease: Secondary | ICD-10-CM | POA: Diagnosis not present

## 2021-06-15 DIAGNOSIS — Z86711 Personal history of pulmonary embolism: Secondary | ICD-10-CM

## 2021-06-15 DIAGNOSIS — M21372 Foot drop, left foot: Secondary | ICD-10-CM | POA: Diagnosis present

## 2021-06-15 DIAGNOSIS — D179 Benign lipomatous neoplasm, unspecified: Secondary | ICD-10-CM | POA: Diagnosis not present

## 2021-06-15 DIAGNOSIS — E875 Hyperkalemia: Secondary | ICD-10-CM | POA: Diagnosis present

## 2021-06-15 DIAGNOSIS — M109 Gout, unspecified: Secondary | ICD-10-CM | POA: Diagnosis present

## 2021-06-15 DIAGNOSIS — Z86718 Personal history of other venous thrombosis and embolism: Secondary | ICD-10-CM

## 2021-06-15 DIAGNOSIS — F419 Anxiety disorder, unspecified: Secondary | ICD-10-CM | POA: Diagnosis present

## 2021-06-15 DIAGNOSIS — Z95828 Presence of other vascular implants and grafts: Secondary | ICD-10-CM

## 2021-06-15 DIAGNOSIS — B9561 Methicillin susceptible Staphylococcus aureus infection as the cause of diseases classified elsewhere: Secondary | ICD-10-CM

## 2021-06-15 DIAGNOSIS — N1831 Chronic kidney disease, stage 3a: Secondary | ICD-10-CM | POA: Diagnosis present

## 2021-06-15 DIAGNOSIS — Z7984 Long term (current) use of oral hypoglycemic drugs: Secondary | ICD-10-CM

## 2021-06-15 DIAGNOSIS — C3490 Malignant neoplasm of unspecified part of unspecified bronchus or lung: Principal | ICD-10-CM | POA: Diagnosis present

## 2021-06-15 DIAGNOSIS — K4090 Unilateral inguinal hernia, without obstruction or gangrene, not specified as recurrent: Secondary | ICD-10-CM | POA: Diagnosis present

## 2021-06-15 DIAGNOSIS — N281 Cyst of kidney, acquired: Secondary | ICD-10-CM | POA: Diagnosis not present

## 2021-06-15 DIAGNOSIS — M7989 Other specified soft tissue disorders: Secondary | ICD-10-CM | POA: Diagnosis not present

## 2021-06-15 DIAGNOSIS — C7951 Secondary malignant neoplasm of bone: Secondary | ICD-10-CM | POA: Diagnosis not present

## 2021-06-15 DIAGNOSIS — R34 Anuria and oliguria: Secondary | ICD-10-CM | POA: Diagnosis present

## 2021-06-15 DIAGNOSIS — R0600 Dyspnea, unspecified: Secondary | ICD-10-CM | POA: Diagnosis not present

## 2021-06-15 DIAGNOSIS — Z85118 Personal history of other malignant neoplasm of bronchus and lung: Secondary | ICD-10-CM | POA: Diagnosis not present

## 2021-06-15 DIAGNOSIS — E876 Hypokalemia: Secondary | ICD-10-CM | POA: Diagnosis not present

## 2021-06-15 DIAGNOSIS — L538 Other specified erythematous conditions: Secondary | ICD-10-CM | POA: Diagnosis not present

## 2021-06-15 DIAGNOSIS — E7800 Pure hypercholesterolemia, unspecified: Secondary | ICD-10-CM | POA: Diagnosis present

## 2021-06-15 DIAGNOSIS — R54 Age-related physical debility: Secondary | ICD-10-CM | POA: Diagnosis not present

## 2021-06-15 DIAGNOSIS — R0602 Shortness of breath: Secondary | ICD-10-CM | POA: Diagnosis not present

## 2021-06-15 DIAGNOSIS — D649 Anemia, unspecified: Secondary | ICD-10-CM | POA: Diagnosis not present

## 2021-06-15 DIAGNOSIS — Z7902 Long term (current) use of antithrombotics/antiplatelets: Secondary | ICD-10-CM

## 2021-06-15 DIAGNOSIS — R000 Tachycardia, unspecified: Secondary | ICD-10-CM | POA: Diagnosis not present

## 2021-06-15 DIAGNOSIS — A4101 Sepsis due to Methicillin susceptible Staphylococcus aureus: Secondary | ICD-10-CM | POA: Diagnosis not present

## 2021-06-15 DIAGNOSIS — Z8249 Family history of ischemic heart disease and other diseases of the circulatory system: Secondary | ICD-10-CM

## 2021-06-15 DIAGNOSIS — R911 Solitary pulmonary nodule: Secondary | ICD-10-CM | POA: Diagnosis not present

## 2021-06-15 DIAGNOSIS — M1612 Unilateral primary osteoarthritis, left hip: Secondary | ICD-10-CM | POA: Diagnosis not present

## 2021-06-15 DIAGNOSIS — R0601 Orthopnea: Secondary | ICD-10-CM | POA: Diagnosis not present

## 2021-06-15 DIAGNOSIS — Z9221 Personal history of antineoplastic chemotherapy: Secondary | ICD-10-CM

## 2021-06-15 DIAGNOSIS — M79622 Pain in left upper arm: Secondary | ICD-10-CM | POA: Diagnosis not present

## 2021-06-15 DIAGNOSIS — Z888 Allergy status to other drugs, medicaments and biological substances status: Secondary | ICD-10-CM

## 2021-06-15 DIAGNOSIS — I2510 Atherosclerotic heart disease of native coronary artery without angina pectoris: Secondary | ICD-10-CM | POA: Diagnosis present

## 2021-06-15 DIAGNOSIS — C782 Secondary malignant neoplasm of pleura: Secondary | ICD-10-CM | POA: Diagnosis present

## 2021-06-15 DIAGNOSIS — R7881 Bacteremia: Secondary | ICD-10-CM

## 2021-06-15 DIAGNOSIS — S8012XA Contusion of left lower leg, initial encounter: Secondary | ICD-10-CM | POA: Diagnosis not present

## 2021-06-15 DIAGNOSIS — Z79899 Other long term (current) drug therapy: Secondary | ICD-10-CM

## 2021-06-15 DIAGNOSIS — I129 Hypertensive chronic kidney disease with stage 1 through stage 4 chronic kidney disease, or unspecified chronic kidney disease: Secondary | ICD-10-CM | POA: Diagnosis not present

## 2021-06-15 DIAGNOSIS — S83242A Other tear of medial meniscus, current injury, left knee, initial encounter: Secondary | ICD-10-CM | POA: Diagnosis not present

## 2021-06-15 DIAGNOSIS — Z8674 Personal history of sudden cardiac arrest: Secondary | ICD-10-CM

## 2021-06-15 DIAGNOSIS — Z8673 Personal history of transient ischemic attack (TIA), and cerebral infarction without residual deficits: Secondary | ICD-10-CM

## 2021-06-15 DIAGNOSIS — I959 Hypotension, unspecified: Secondary | ICD-10-CM | POA: Diagnosis not present

## 2021-06-15 DIAGNOSIS — S7012XS Contusion of left thigh, sequela: Secondary | ICD-10-CM | POA: Diagnosis not present

## 2021-06-15 DIAGNOSIS — D638 Anemia in other chronic diseases classified elsewhere: Secondary | ICD-10-CM | POA: Diagnosis not present

## 2021-06-15 DIAGNOSIS — J811 Chronic pulmonary edema: Secondary | ICD-10-CM | POA: Diagnosis present

## 2021-06-15 DIAGNOSIS — I499 Cardiac arrhythmia, unspecified: Secondary | ICD-10-CM | POA: Diagnosis not present

## 2021-06-15 DIAGNOSIS — R6889 Other general symptoms and signs: Secondary | ICD-10-CM | POA: Diagnosis not present

## 2021-06-15 DIAGNOSIS — I808 Phlebitis and thrombophlebitis of other sites: Secondary | ICD-10-CM

## 2021-06-15 DIAGNOSIS — M1712 Unilateral primary osteoarthritis, left knee: Secondary | ICD-10-CM | POA: Diagnosis not present

## 2021-06-15 DIAGNOSIS — I700 Atherosclerosis of aorta: Secondary | ICD-10-CM | POA: Diagnosis not present

## 2021-06-15 DIAGNOSIS — M5126 Other intervertebral disc displacement, lumbar region: Secondary | ICD-10-CM | POA: Diagnosis not present

## 2021-06-15 DIAGNOSIS — E872 Acidosis, unspecified: Secondary | ICD-10-CM | POA: Diagnosis present

## 2021-06-15 DIAGNOSIS — M47816 Spondylosis without myelopathy or radiculopathy, lumbar region: Secondary | ICD-10-CM | POA: Diagnosis not present

## 2021-06-15 DIAGNOSIS — N179 Acute kidney failure, unspecified: Secondary | ICD-10-CM

## 2021-06-15 DIAGNOSIS — E8770 Fluid overload, unspecified: Secondary | ICD-10-CM | POA: Diagnosis present

## 2021-06-15 DIAGNOSIS — Z955 Presence of coronary angioplasty implant and graft: Secondary | ICD-10-CM

## 2021-06-15 DIAGNOSIS — S7012XD Contusion of left thigh, subsequent encounter: Secondary | ICD-10-CM

## 2021-06-15 DIAGNOSIS — Z6820 Body mass index (BMI) 20.0-20.9, adult: Secondary | ICD-10-CM

## 2021-06-15 DIAGNOSIS — M7120 Synovial cyst of popliteal space [Baker], unspecified knee: Secondary | ICD-10-CM | POA: Diagnosis present

## 2021-06-15 DIAGNOSIS — R0902 Hypoxemia: Secondary | ICD-10-CM | POA: Diagnosis present

## 2021-06-15 DIAGNOSIS — J90 Pleural effusion, not elsewhere classified: Secondary | ICD-10-CM | POA: Diagnosis not present

## 2021-06-15 DIAGNOSIS — D630 Anemia in neoplastic disease: Secondary | ICD-10-CM | POA: Diagnosis present

## 2021-06-15 LAB — RENAL FUNCTION PANEL
Albumin: 2.5 g/dL — ABNORMAL LOW (ref 3.5–5.0)
Anion gap: 11 (ref 5–15)
BUN: 42 mg/dL — ABNORMAL HIGH (ref 8–23)
CO2: 14 mmol/L — ABNORMAL LOW (ref 22–32)
Calcium: 8.1 mg/dL — ABNORMAL LOW (ref 8.9–10.3)
Chloride: 111 mmol/L (ref 98–111)
Creatinine, Ser: 4.65 mg/dL — ABNORMAL HIGH (ref 0.61–1.24)
GFR, Estimated: 12 mL/min — ABNORMAL LOW (ref >60)
Glucose, Bld: 82 mg/dL (ref 70–99)
Phosphorus: 5.7 mg/dL — ABNORMAL HIGH (ref 2.5–4.6)
Potassium: 5.5 mmol/L — ABNORMAL HIGH (ref 3.5–5.1)
Sodium: 136 mmol/L (ref 135–145)

## 2021-06-15 LAB — I-STAT CHEM 8, ED
BUN: 43 mg/dL — ABNORMAL HIGH (ref 8–23)
Calcium, Ion: 1.04 mmol/L — ABNORMAL LOW (ref 1.15–1.40)
Chloride: 110 mmol/L (ref 98–111)
Creatinine, Ser: 5 mg/dL — ABNORMAL HIGH (ref 0.61–1.24)
Glucose, Bld: 84 mg/dL (ref 70–99)
HCT: 28 % — ABNORMAL LOW (ref 39.0–52.0)
Hemoglobin: 9.5 g/dL — ABNORMAL LOW (ref 13.0–17.0)
Potassium: 5.1 mmol/L (ref 3.5–5.1)
Sodium: 135 mmol/L (ref 135–145)
TCO2: 16 mmol/L — ABNORMAL LOW (ref 22–32)

## 2021-06-15 LAB — BASIC METABOLIC PANEL
Anion gap: 9 (ref 5–15)
Anion gap: 9 (ref 5–15)
BUN: 44 mg/dL — ABNORMAL HIGH (ref 8–23)
BUN: 48 mg/dL — ABNORMAL HIGH (ref 8–23)
CO2: 17 mmol/L — ABNORMAL LOW (ref 22–32)
CO2: 16 mmol/L — ABNORMAL LOW (ref 22–32)
Calcium: 8.4 mg/dL — ABNORMAL LOW (ref 8.9–10.3)
Calcium: 8.1 mg/dL — ABNORMAL LOW (ref 8.9–10.3)
Chloride: 111 mmol/L (ref 98–111)
Chloride: 112 mmol/L — ABNORMAL HIGH (ref 98–111)
Creatinine, Ser: 4.93 mg/dL — ABNORMAL HIGH (ref 0.61–1.24)
Creatinine, Ser: 5.19 mg/dL — ABNORMAL HIGH (ref 0.61–1.24)
GFR, Estimated: 11 mL/min — ABNORMAL LOW (ref >60)
GFR, Estimated: 11 mL/min — ABNORMAL LOW (ref >60)
Glucose, Bld: 100 mg/dL — ABNORMAL HIGH (ref 70–99)
Glucose, Bld: 99 mg/dL (ref 70–99)
Potassium: 5.9 mmol/L — ABNORMAL HIGH (ref 3.5–5.1)
Potassium: 5.4 mmol/L — ABNORMAL HIGH (ref 3.5–5.1)
Sodium: 137 mmol/L (ref 135–145)
Sodium: 137 mmol/L (ref 135–145)

## 2021-06-15 LAB — COMPREHENSIVE METABOLIC PANEL
ALT: 13 U/L (ref 0–44)
AST: 21 U/L (ref 15–41)
Albumin: 2.6 g/dL — ABNORMAL LOW (ref 3.5–5.0)
Alkaline Phosphatase: 86 U/L (ref 38–126)
Anion gap: 11 (ref 5–15)
BUN: 43 mg/dL — ABNORMAL HIGH (ref 8–23)
CO2: 16 mmol/L — ABNORMAL LOW (ref 22–32)
Calcium: 8.5 mg/dL — ABNORMAL LOW (ref 8.9–10.3)
Chloride: 109 mmol/L (ref 98–111)
Creatinine, Ser: 4.73 mg/dL — ABNORMAL HIGH (ref 0.61–1.24)
GFR, Estimated: 12 mL/min — ABNORMAL LOW (ref >60)
Glucose, Bld: 89 mg/dL (ref 70–99)
Potassium: 5.2 mmol/L — ABNORMAL HIGH (ref 3.5–5.1)
Sodium: 136 mmol/L (ref 135–145)
Total Bilirubin: 1.4 mg/dL — ABNORMAL HIGH (ref 0.3–1.2)
Total Protein: 6.3 g/dL — ABNORMAL LOW (ref 6.5–8.1)

## 2021-06-15 LAB — TROPONIN I (HIGH SENSITIVITY)
Troponin I (High Sensitivity): 28 ng/L — ABNORMAL HIGH (ref <18)
Troponin I (High Sensitivity): 27 ng/L — ABNORMAL HIGH (ref <18)

## 2021-06-15 LAB — CBC WITH DIFFERENTIAL/PLATELET
Abs Immature Granulocytes: 0.04 10*3/uL (ref 0.00–0.07)
Basophils Absolute: 0.1 10*3/uL (ref 0.0–0.1)
Basophils Relative: 1 %
Eosinophils Absolute: 0.2 10*3/uL (ref 0.0–0.5)
Eosinophils Relative: 2 %
HCT: 30.1 % — ABNORMAL LOW (ref 39.0–52.0)
Hemoglobin: 9.6 g/dL — ABNORMAL LOW (ref 13.0–17.0)
Immature Granulocytes: 0 %
Lymphocytes Relative: 43 %
Lymphs Abs: 4.1 10*3/uL — ABNORMAL HIGH (ref 0.7–4.0)
MCH: 29.9 pg (ref 26.0–34.0)
MCHC: 31.9 g/dL (ref 30.0–36.0)
MCV: 93.8 fL (ref 80.0–100.0)
Monocytes Absolute: 0.9 10*3/uL (ref 0.1–1.0)
Monocytes Relative: 9 %
Neutro Abs: 4.3 10*3/uL (ref 1.7–7.7)
Neutrophils Relative %: 45 %
Platelets: 318 10*3/uL (ref 150–400)
RBC: 3.21 MIL/uL — ABNORMAL LOW (ref 4.22–5.81)
RDW: 17.2 % — ABNORMAL HIGH (ref 11.5–15.5)
WBC: 9.5 10*3/uL (ref 4.0–10.5)
nRBC: 0 % (ref 0.0–0.2)

## 2021-06-15 LAB — URINALYSIS, ROUTINE W REFLEX MICROSCOPIC
Bilirubin Urine: NEGATIVE
Glucose, UA: NEGATIVE mg/dL
Ketones, ur: NEGATIVE mg/dL
Leukocytes,Ua: NEGATIVE
Nitrite: NEGATIVE
Protein, ur: 100 mg/dL — AB
Specific Gravity, Urine: >1.03 — ABNORMAL HIGH (ref 1.005–1.030)
pH: 5 (ref 5.0–8.0)

## 2021-06-15 LAB — URINALYSIS, MICROSCOPIC (REFLEX)

## 2021-06-15 LAB — CREATININE, URINE, RANDOM: Creatinine, Urine: 70.17 mg/dL

## 2021-06-15 LAB — RESP PANEL BY RT-PCR (FLU A&B, COVID) ARPGX2
Influenza A by PCR: NEGATIVE
Influenza B by PCR: NEGATIVE
SARS Coronavirus 2 by RT PCR: NEGATIVE

## 2021-06-15 LAB — SODIUM, URINE, RANDOM: Sodium, Ur: 27 mmol/L

## 2021-06-15 IMAGING — DX DG CHEST 1V PORT
1 series · 1 of 1 positions shown · non-contrast
Comparison: CT from [DATE]

CLINICAL DATA: Shortness of breath

EXAM:
PORTABLE CHEST 1 VIEW

[chest]
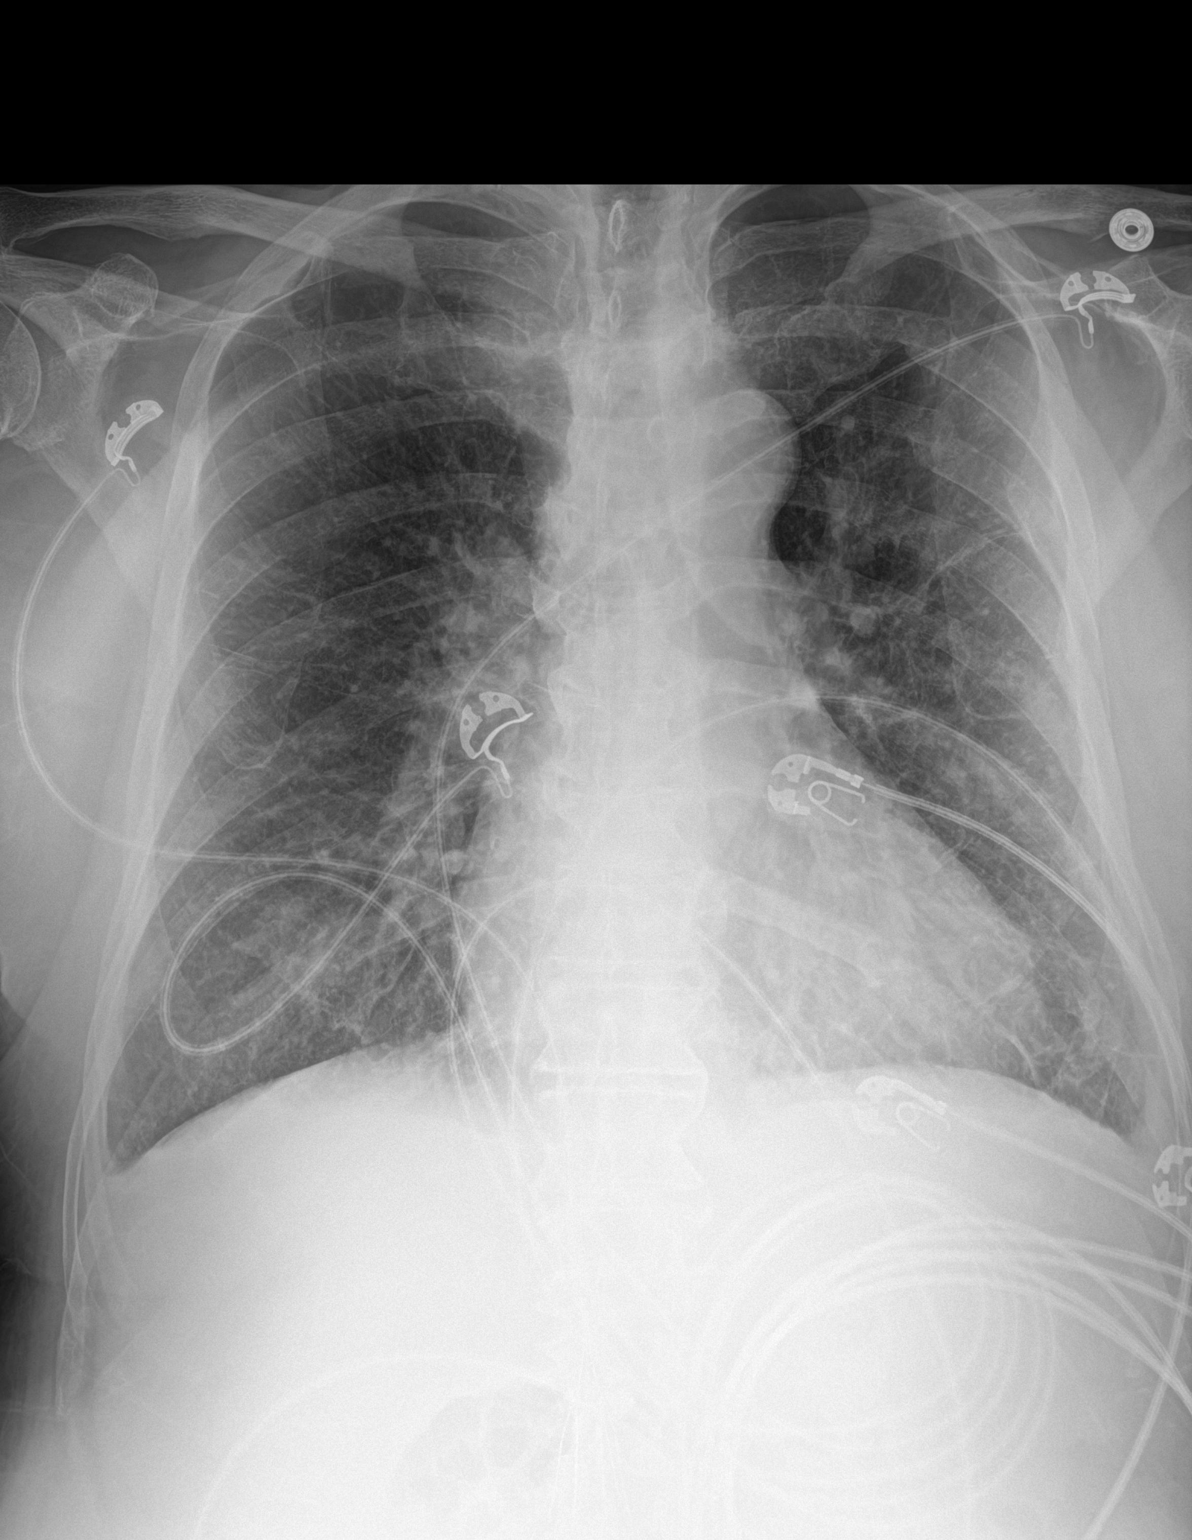

[1 of 1 positions shown; findings below may reference images not displayed]

FINDINGS: Cardiac shadow is within normal limits. Lungs are well aerated
bilaterally. No focal infiltrate or sizable effusion is noted. Old
healed rib fractures on the left are noted. No acute bony
abnormality is seen.
IMPRESSION: No acute abnormality noted.

## 2021-06-15 IMAGING — US US RENAL
1 series · 14 of 25 positions shown · non-contrast
Comparison: CT abdomen/pelvis [DATE]

CLINICAL DATA: MAYABEL

EXAM:
RENAL / URINARY TRACT ULTRASOUND COMPLETE

[Series 1: us renal · 30 acquisitions, 14 frames shown]
[im 1/30]
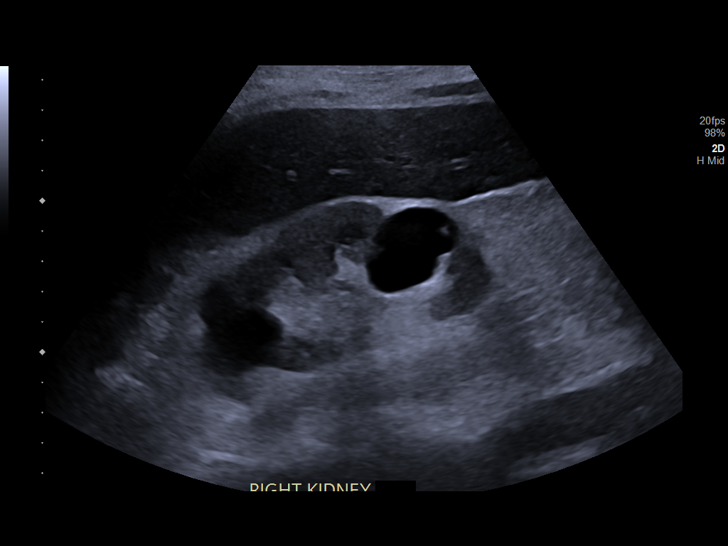
[im 3/30]
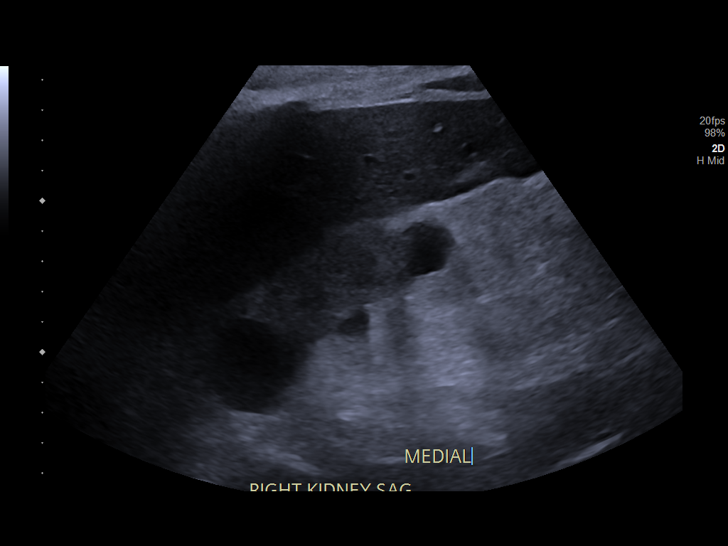
[im 5/30]
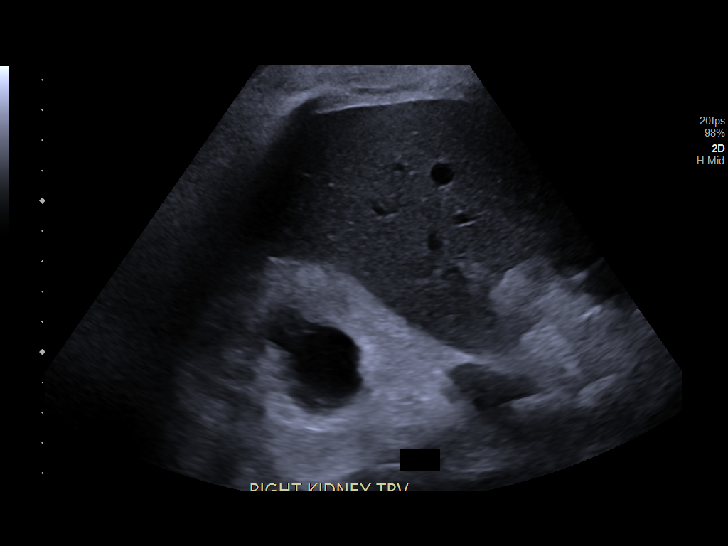
[im 8/30]
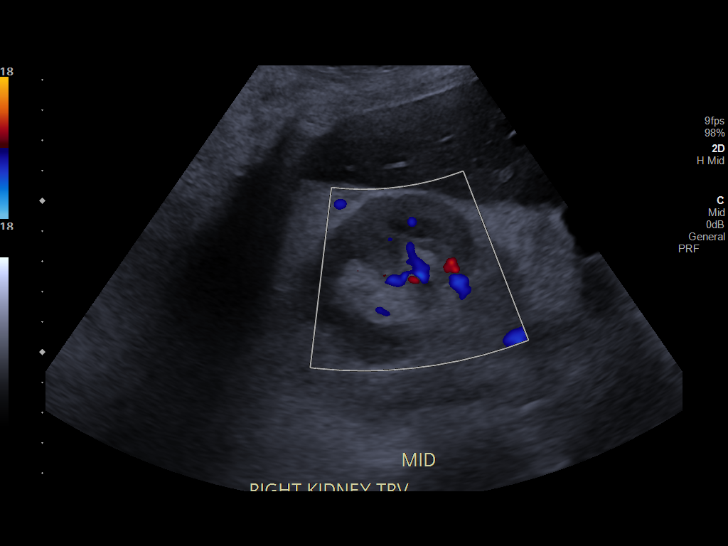
[im 10/30]
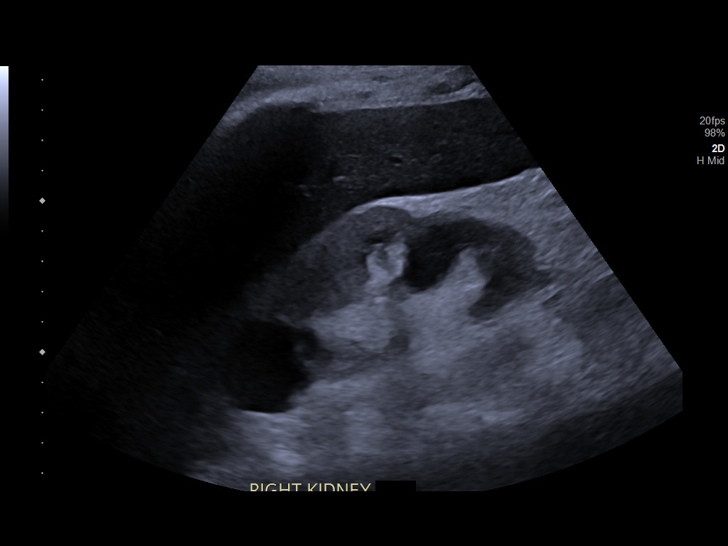
[im 11/30]
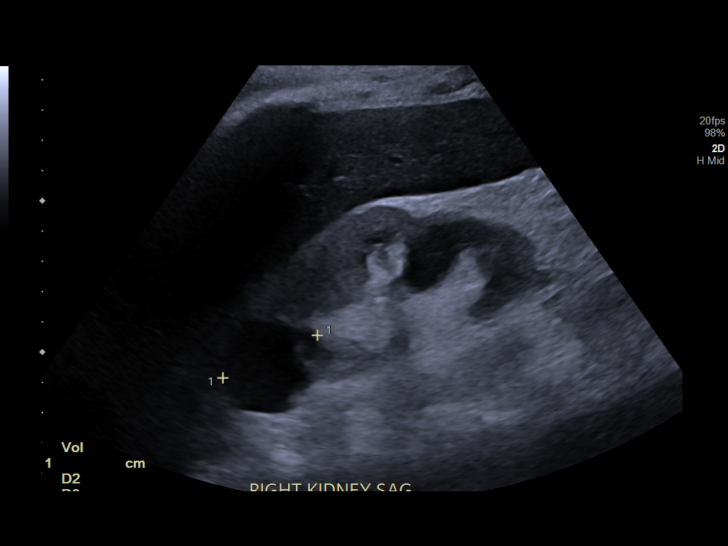
[im 14/30]
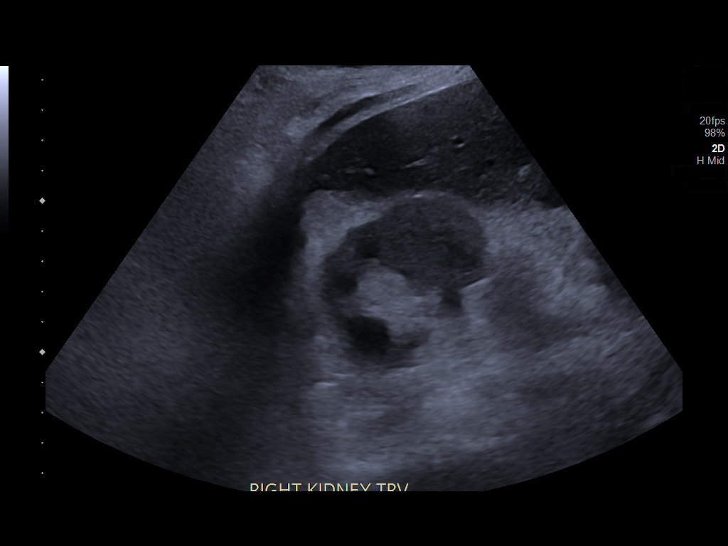
[im 16/30]
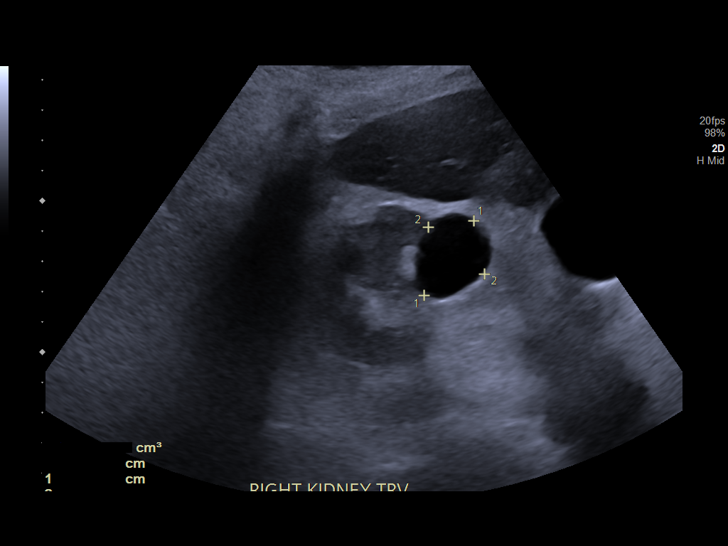
[im 19/30]
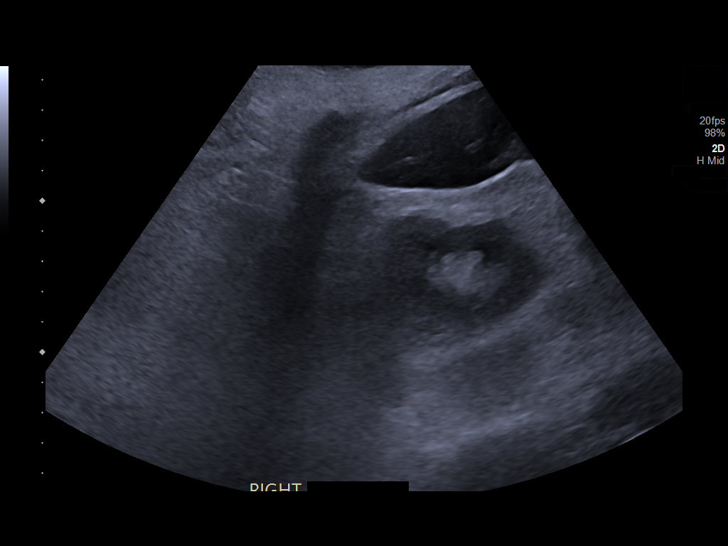
[im 20/30]
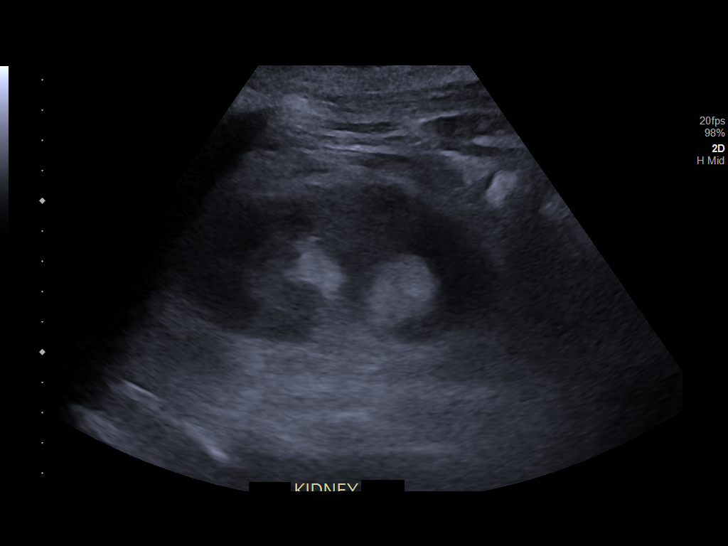
[im 22/30]
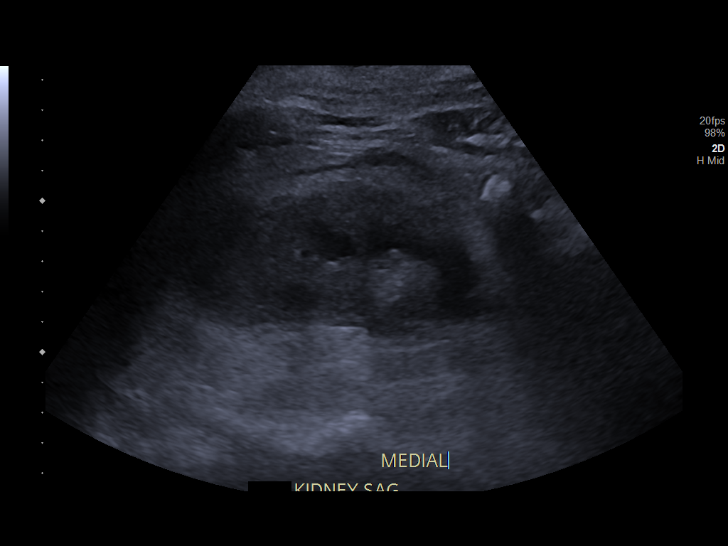
[im 25/30]
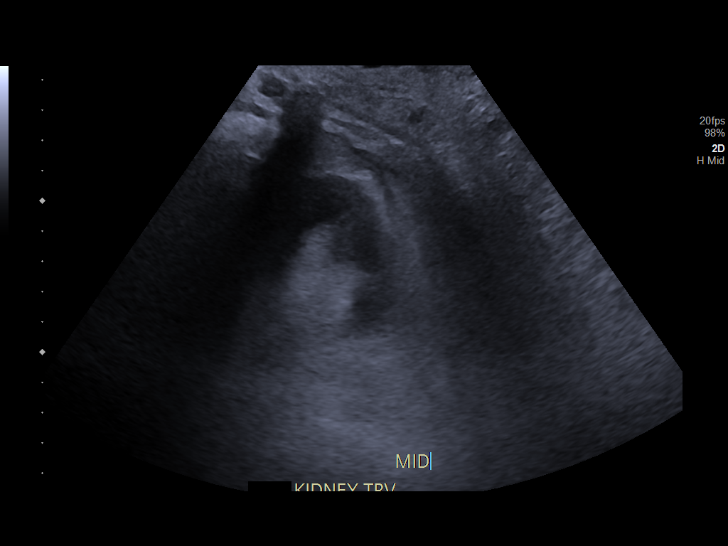
[im 27/30]
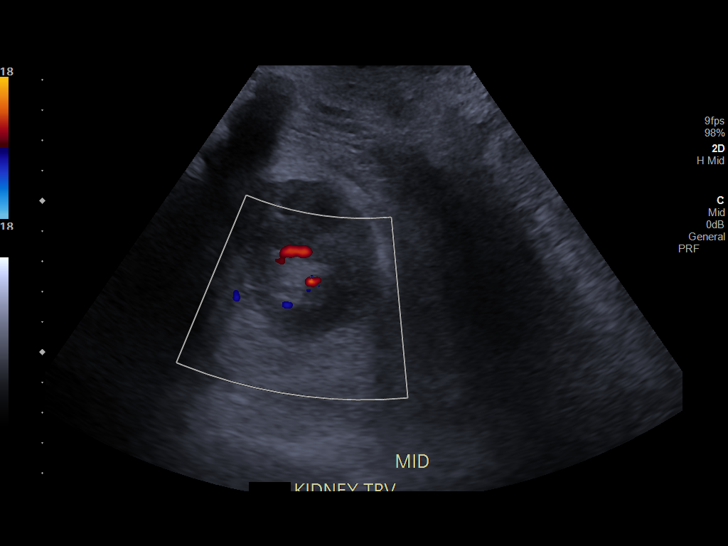
[im 30/30]
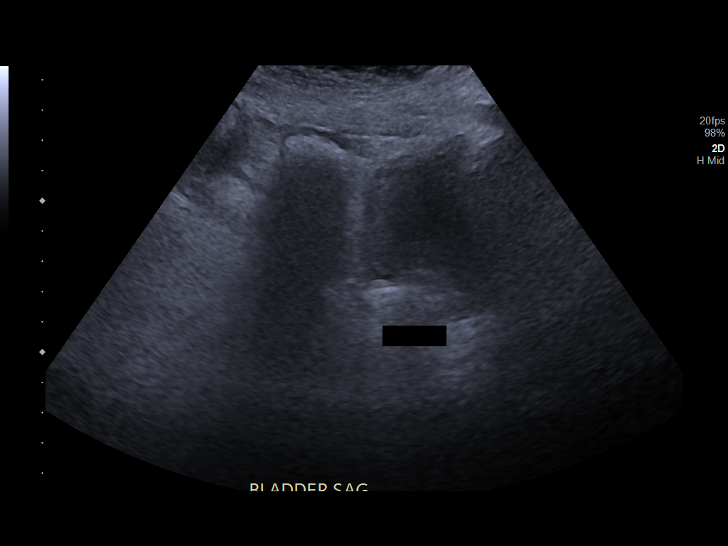

[14 of 25 positions shown; findings below may reference images not displayed]

FINDINGS: Right Kidney:

Renal measurements: 10.8 cm x 4.8 cm x 5.9 cm = volume: 161 mL.
Multiple cysts are noted measuring up to 3.4 cm. No specific imaging
follow-up is required. Parenchymal echogenicity is mildly increased.
There is no hydronephrosis.

Left Kidney:

Renal measurements: 10.9 cm x 5.3 cm x 4.9 cm = volume: 147 mL.
Parenchymal echogenicity is mildly increased. No mass or
hydronephrosis visualized.

Bladder:

Decompressed and not well evaluated.

Other:

None.
IMPRESSION: Mildly increased parenchymal echogenicity bilaterally suggestive of
medical renal disease. No hydronephrosis.

## 2021-06-15 IMAGING — MR MR FEMUR*L* W/O CM
8 series · 40 of 40 positions shown · non-contrast
Comparison: CT [DATE]

CLINICAL DATA: Soft tissue mass, thigh, superficial inferior
gluteus maximus muscle in the upper thigh

EXAM:
MR OF THE LEFT FEMUR WITHOUT CONTRAST
TECHNIQUE: Multiplanar, multisequence MR imaging of the left thigh was
performed. No intravenous contrast was administered.

[Series 4: composed cor t1_comp · coronal · left · 6.0mm · 1.25mm/px · 4 of 35 slices shown]
[im 1/35]
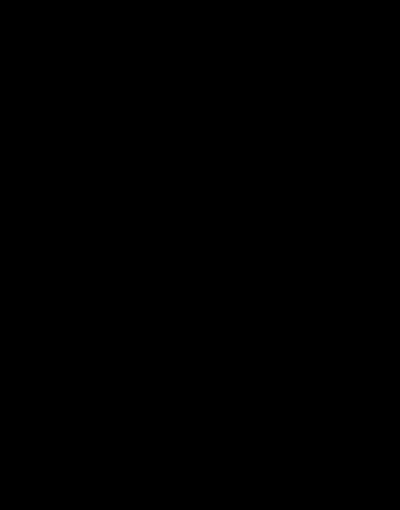
[im 12/35]
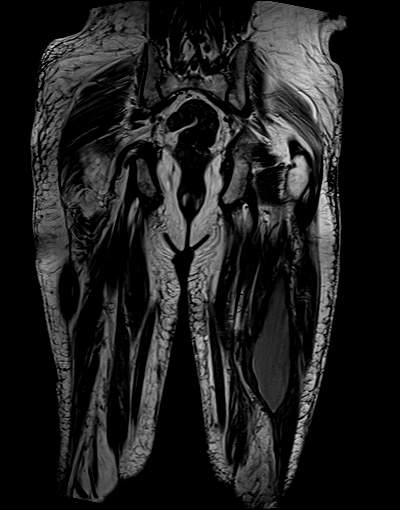
[im 23/35]
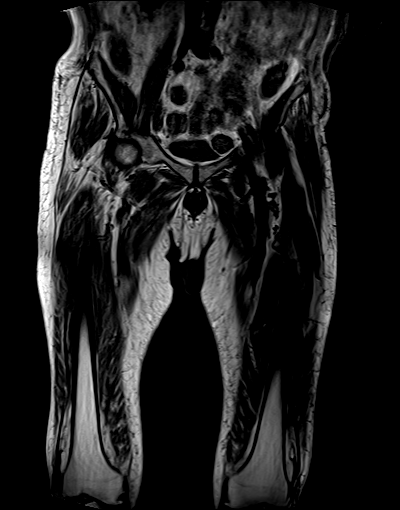
[im 35/35]
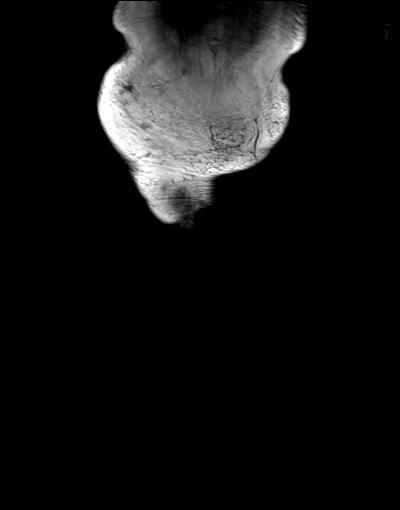

[Series 8: composed cor stir_comp · coronal · left · 6.0mm · 1.25mm/px · 4 of 36 slices shown]
[im 1/36]
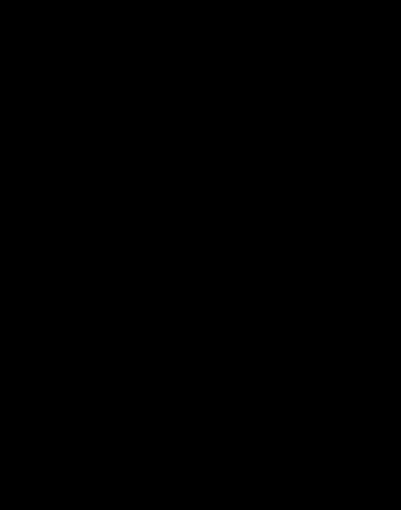
[im 12/36]
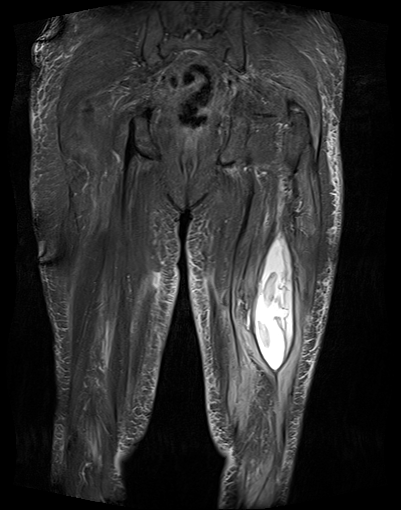
[im 24/36]
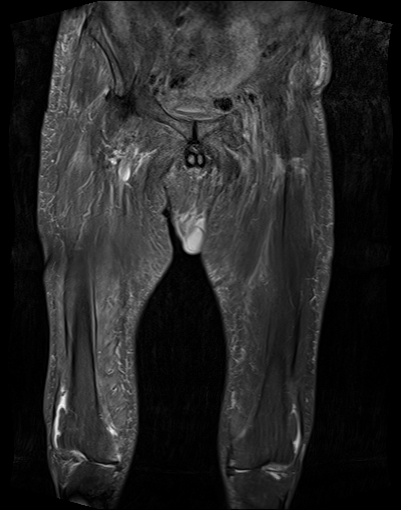
[im 36/36]
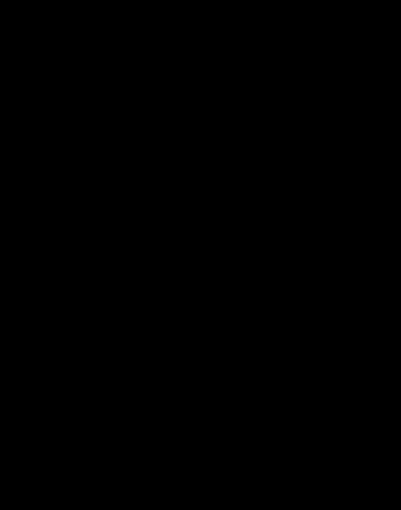

[Series 12: ax t1_comp · axial · left · 5.0mm · 0.78mm/px · z∈[-397,+112]mm · 10 of 86 slices shown]
[im 1/86]
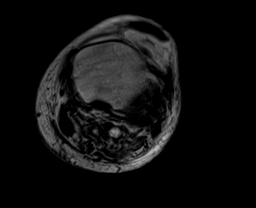
[im 10/86]
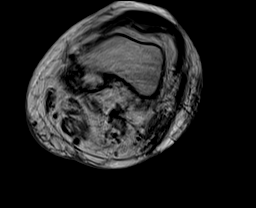
[im 19/86]
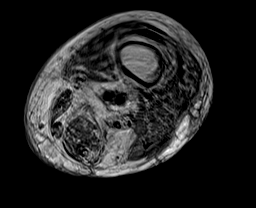
[im 29/86]
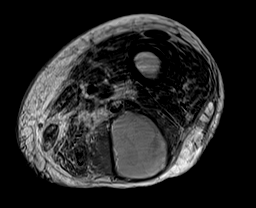
[im 38/86]
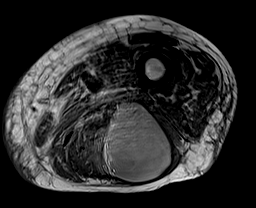
[im 48/86]
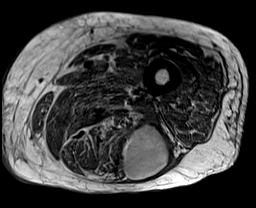
[im 57/86]
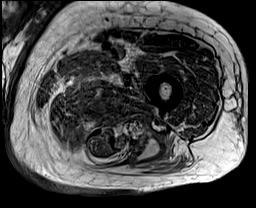
[im 67/86]
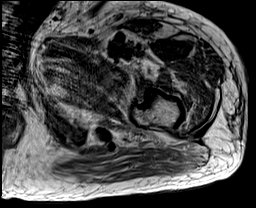
[im 76/86]
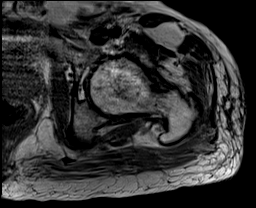
[im 86/86]
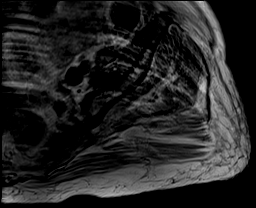

[Series 15: T2 · axial · left · 5.0mm · 0.87mm/px · z∈[-140,+112]mm · 5 of 43 slices shown (1 of 3)]
[im 1/43]
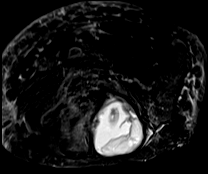
[im 11/43]
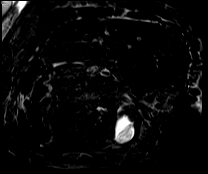
[im 22/43]
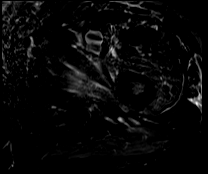
[im 32/43]
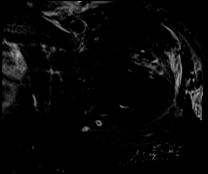
[im 43/43]
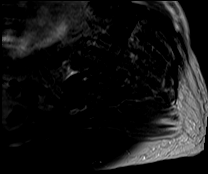

[Series 15: T2 · axial · left · 5.0mm · 0.87mm/px · z∈[-397,-145]mm · 5 of 43 slices shown (2 of 3)]
[im 1/43]
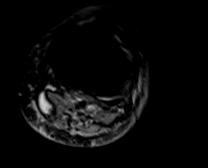
[im 11/43]
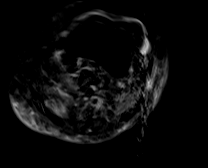
[im 22/43]
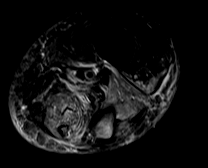
[im 32/43]
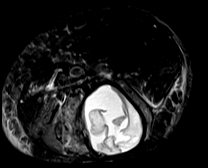
[im 43/43]
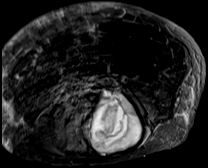

[Series 16: T2 fat-sat · sagittal · left · 4.0mm · 1.15mm/px · 4 of 35 slices shown (1 of 2)]
[im 1/35]
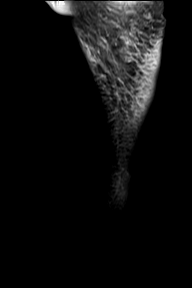
[im 12/35]
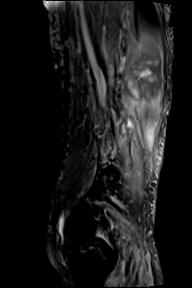
[im 23/35]
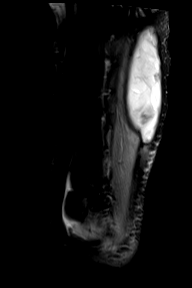
[im 35/35]
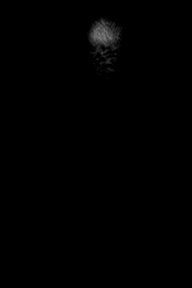

[Series 17: T2 fat-sat · sagittal · left · 4.0mm · 1.15mm/px · 4 of 35 slices shown (2 of 2)]
[im 1/35]
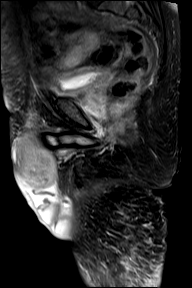
[im 12/35]
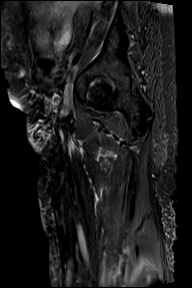
[im 23/35]
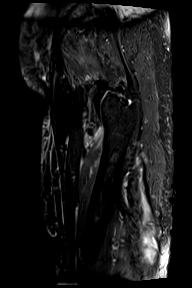
[im 35/35]
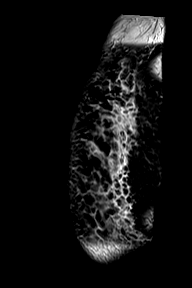

[Series 18: T2 · sagittal · left · 5.0mm · 1.15mm/px · 4 of 35 slices shown (3 of 3)]
[im 1/35]
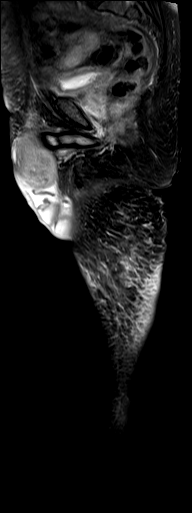
[im 12/35]
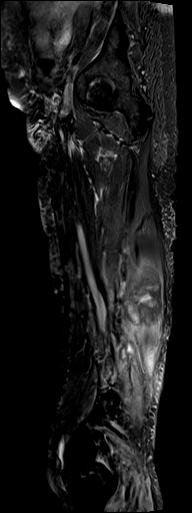
[im 23/35]
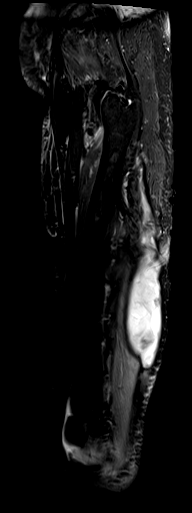
[im 35/35]
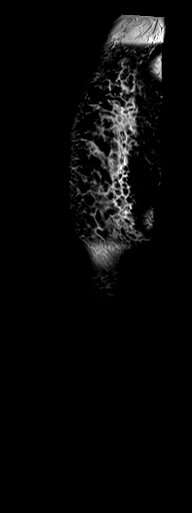

[40 of 40 positions shown; findings below may reference images not displayed]

FINDINGS: Bones/Joint/Cartilage

No acute fracture. No dislocation. No femoral head avascular
necrosis. No bone marrow edema. No marrow replacing bone lesion.
Mild degenerative changes of the left hip. No left hip joint
effusion. Moderate degenerative changes of the left knee. Posterior
horn of the medial meniscus of the left knee is torn (series 18,
images 9-10). Small-moderate-sized Baker's cyst.

Ligaments

Not well assessed at the edges of the field of view. No acute injury
is evident.

Muscles and Tendons

Large elongated well-defined complex fluid collection within the
posterior compartment of the mid left thigh. Collection measures
23.0 x 4.9 x 6.5 cm. Collection demonstrates intermediate to high T1
signal and is heterogeneously hyperintense on T2 weighted imaging.
Serpiginous band-like areas of relatively decreased T2 signal
intensity within the collection. No definite solid component. Mild
intramuscular edema within the adjacent posterior compartment
musculature of the left thigh, likely reactive.

Generalized muscle atrophy. No acute tendinous abnormality. Left
hamstring tendons intact. Incidentally noted lipoma superficial to
the proximal left rectus femoris muscle measuring 7.5 x 2.5 x 4.2 cm
(series 4, image 26). No internal complexity.

Soft tissues

There is a large right inguinal hernia containing bowel. Generalized
soft tissue edema. No additional fluid collections. No left inguinal
lymphadenopathy.
IMPRESSION: 1. Large well-defined fluid collection within the posterior
compartment of the mid left thigh measuring 23.0 x 4.9 x 6.5 cm.
Appearance is most consistent with a hematoma.
2. No acute osseous abnormality.  No suspicious bone lesion.
3. Large right inguinal hernia containing bowel.
4. Posterior horn of the medial meniscus of the left knee is torn.
5. Small-moderate-sized Baker's cyst.

## 2021-06-15 IMAGING — CT CT CHEST W/O CM
2 of 4 series · 14 of 36 positions shown, 17 images · non-contrast
Comparison: Chest CT [DATE].  Chest x-ray [DATE].

CLINICAL DATA: 80-year-old male with history of abnormal chest
x-ray. Lung nodule. Shortness of breath. History of stage IV lung
cancer.

* Tracking Code: BO *
EXAM:
CT CHEST WITHOUT CONTRAST
TECHNIQUE: Multidetector CT imaging of the chest was performed following the
standard protocol without IV contrast.
RADIATION DOSE REDUCTION: This exam was performed according to the
departmental dose-optimization program which includes automated
exposure control, adjustment of the mA and/or kV according to
patient size and/or use of iterative reconstruction technique.

[Series 3: chest wo · axial · 0.71mm/px · z∈[-482,-234]mm · 11 of 148 slices shown, 14 images]
[im 12/148  mediastinal]
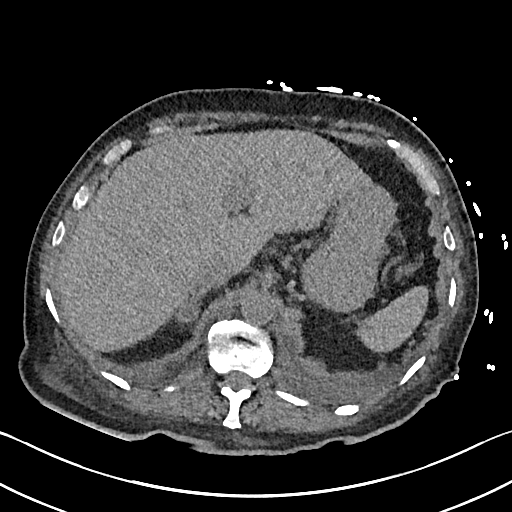
[im 12/148  lung]
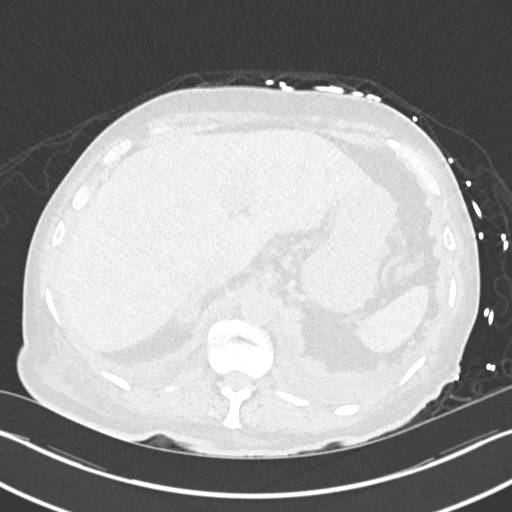
[im 23/148  lung]
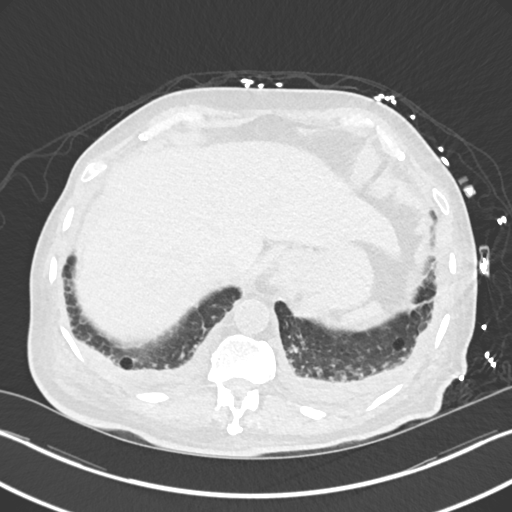
[im 34/148  lung]
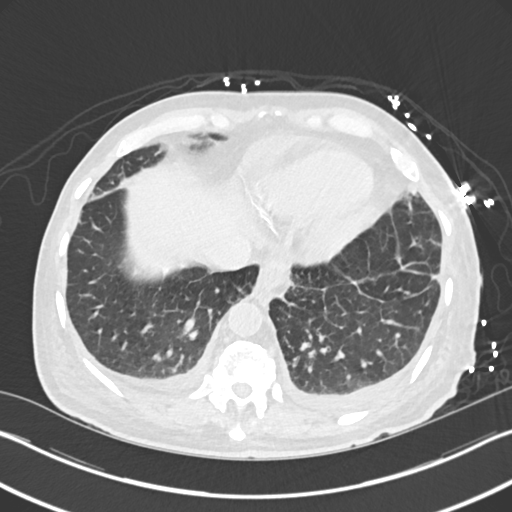
[im 46/148  lung]
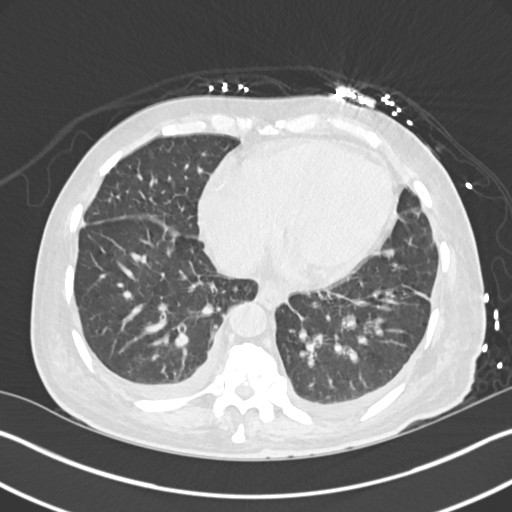
[im 57/148  mediastinal]
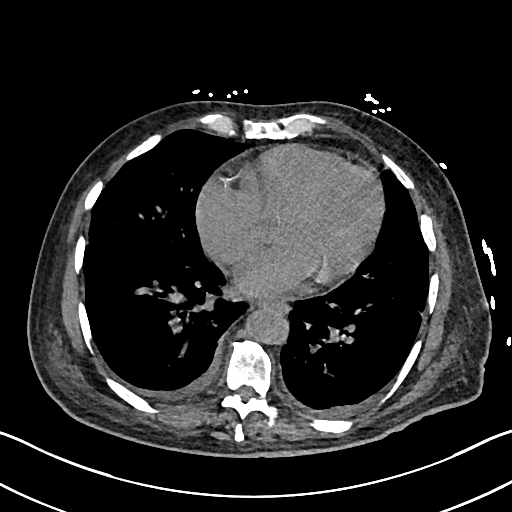
[im 57/148  lung]
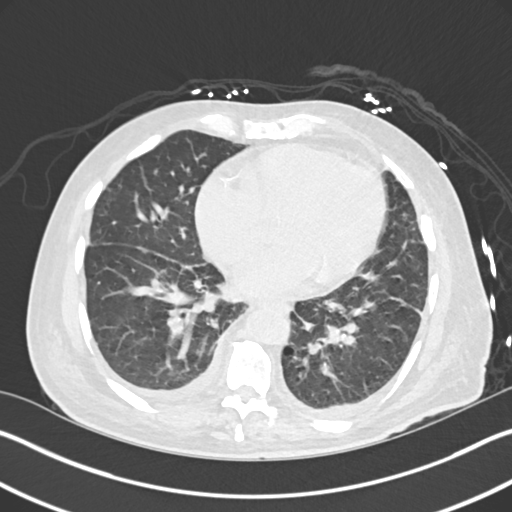
[im 80/148  lung]
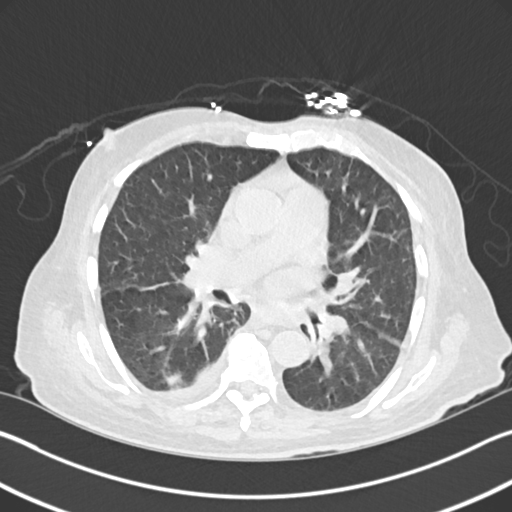
[im 91/148  lung]
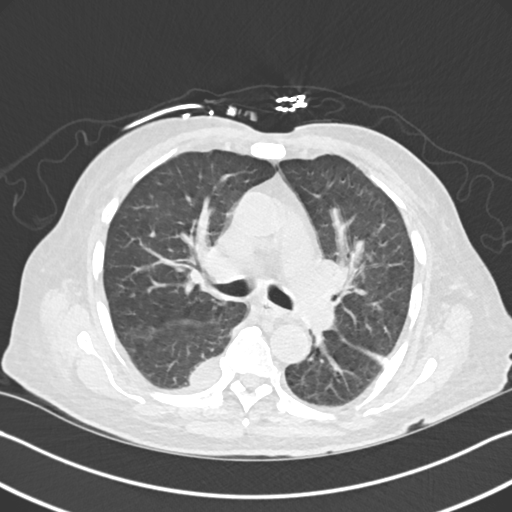
[im 102/148  lung]
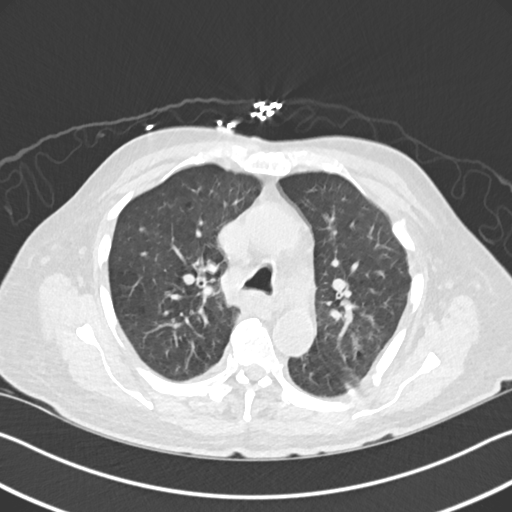
[im 114/148  mediastinal]
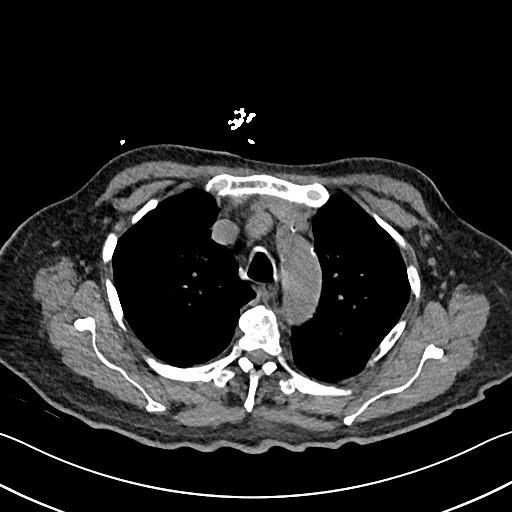
[im 114/148  lung]
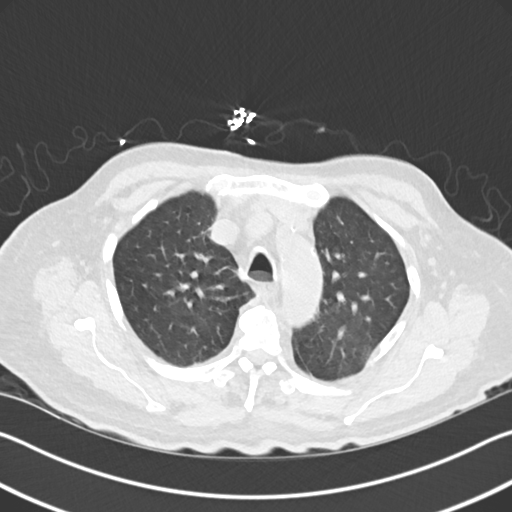
[im 125/148  lung]
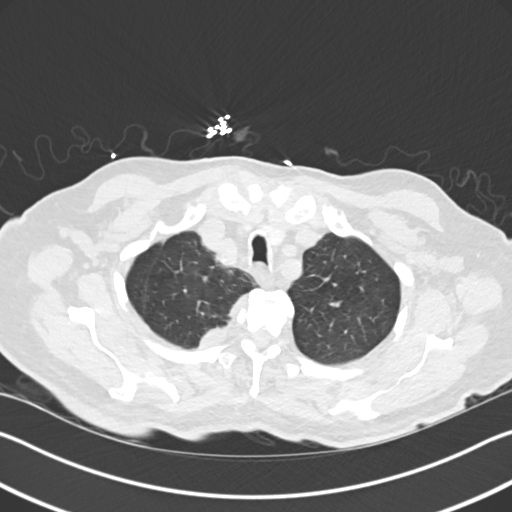
[im 136/148  lung]
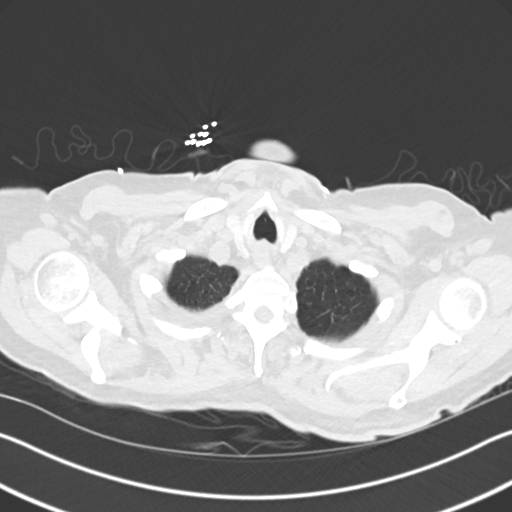

[Series 6: cor · coronal · 0.62mm/px · 3 of 135 slices shown]
[im 27/135  lung]
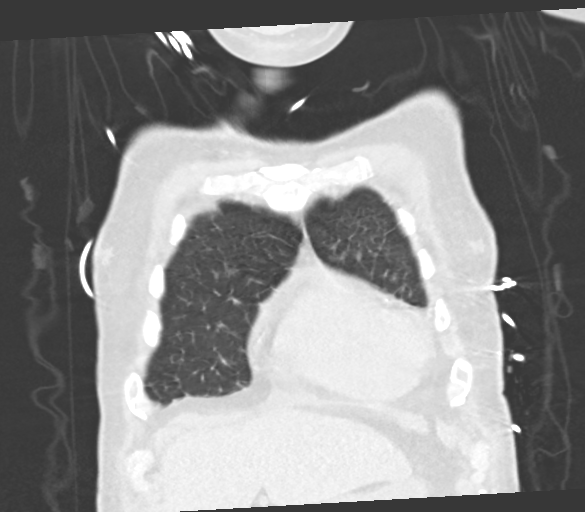
[im 54/135  lung]
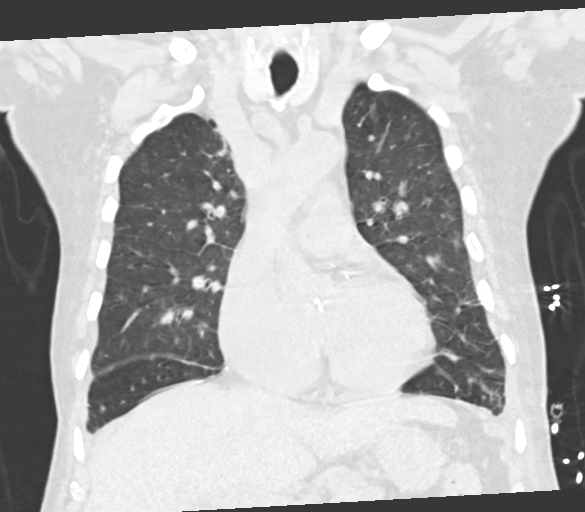
[im 81/135  lung]
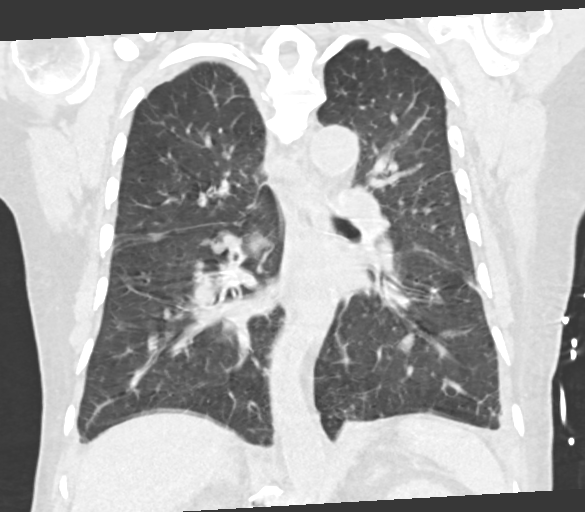

[14 of 36 positions shown; findings below may reference images not displayed]

FINDINGS: Cardiovascular: Heart size is mildly enlarged. There is no
significant pericardial fluid, thickening or pericardial
calcification. There is aortic atherosclerosis, as well as
atherosclerosis of the great vessels of the mediastinum and the
coronary arteries, including calcified atherosclerotic plaque in the
left main, left anterior descending, left circumflex and right
coronary arteries. Calcifications of the aortic valve.

Mediastinum/Nodes: No pathologically enlarged mediastinal or hilar
lymph nodes. Esophagus is unremarkable in appearance. No axillary
lymphadenopathy.

Lungs/Pleura: Previously noted right lower lobe pulmonary nodule
appears slightly larger than the recent prior study, currently
measuring 1.2 x 0.7 cm (axial image 69 of series 4). Increasing
nodular and mass-like pleural thickening bilaterally (right greater
than left), with the largest pleural lesion on the right
posteromedially (axial image 64 of series 3) measuring 2.9 x 1.6 cm
(previously only 1.6 x 0.6 cm when measured in retrospect on prior
examination [DATE] on axial image 31 of series 2. Other
right-sided pleural nodularity is also noted, most evident near the
apex (axial image 23 of series 3) measuring 2.4 x 0.9 cm, new
compared to the prior study. Less pronounced nodular pleural
thickening is also noted in the left hemithorax is well. Trace
volume of pleural fluid bilaterally. No confluent consolidative
airspace disease. Mild diffuse bronchial wall thickening with very
mild centrilobular and paraseptal emphysema.

Upper Abdomen: Incompletely imaged exophytic low-attenuation lesion
in the upper pole of the right kidney measuring at least 3.1 cm in
diameter, previously characterized as a simple cyst. Aortic
atherosclerosis. 1.7 x 1.2 cm nodule in the medial limb of the right
adrenal gland.

Musculoskeletal: Numerous sclerotic lesions are again noted
throughout the visualized axial and appendicular skeleton,
indicative of widespread metastatic disease to the bones. This
appears grossly similar to the recent prior study. Multiple old
healed left-sided rib fractures are incidentally noted.
IMPRESSION: 1. Today's study demonstrates progression of disease, with slight
interval enlargement of the previously noted right lower lobe
pulmonary nodule, and what appears to be rapidly progressive pleural
metastases bilaterally (right greater than left), as above. Diffuse
osseous metastatic disease appears grossly unchanged.
2. Mild cardiomegaly.
3. Aortic atherosclerosis, in addition to left main and three-vessel
coronary artery disease.
4. There are calcifications of the aortic valve. Echocardiographic
correlation for evaluation of potential valvular dysfunction may be
warranted if clinically indicated.
5. Mild diffuse bronchial wall thickening with very mild
centrilobular and paraseptal emphysema; imaging findings suggestive
of underlying COPD.

Aortic Atherosclerosis ([NU]-[NU]) and Emphysema ([NU]-[NU]).

## 2021-06-15 IMAGING — MR MR LUMBAR SPINE W/O CM
4 of 5 series · 26 of 48 positions shown · non-contrast
Comparison: None Available.

CLINICAL DATA: Left foot drop

EXAM:
MRI LUMBAR SPINE WITHOUT CONTRAST
TECHNIQUE: Multiplanar, multisequence MR imaging of the lumbar spine was
performed. No intravenous contrast was administered.

[Series 2: T2 · sagittal · 4.0mm · 0.73mm/px · 6 of 16 slices shown (1 of 2)]
[im 1/16]
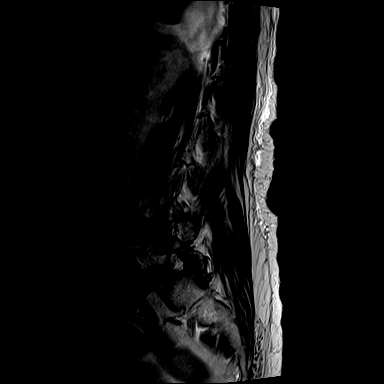
[im 4/16]
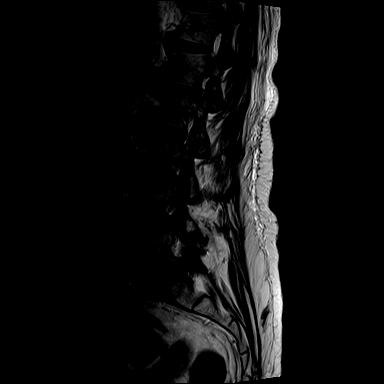
[im 7/16]
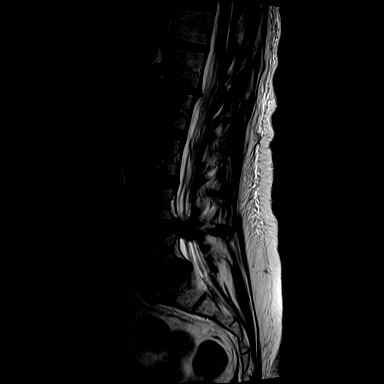
[im 10/16]
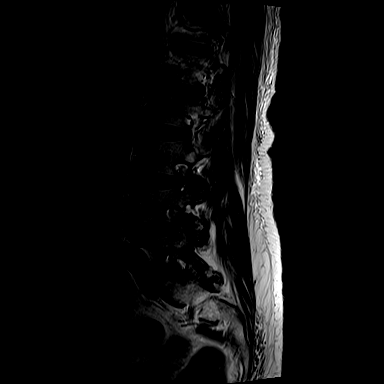
[im 13/16]
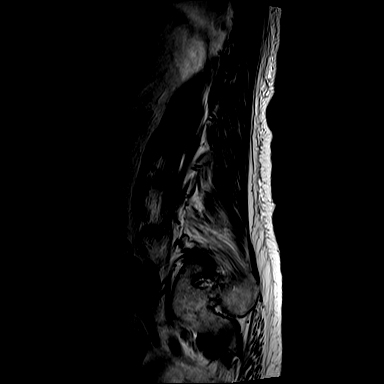
[im 16/16]
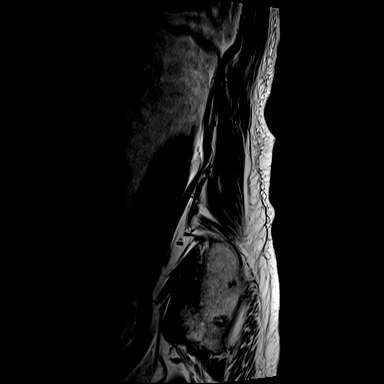

[Series 4: T1 · sagittal · 4.0mm · 0.88mm/px · 6 of 16 slices shown (1 of 2)]
[im 1/16]
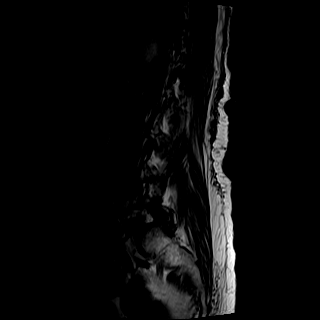
[im 4/16]
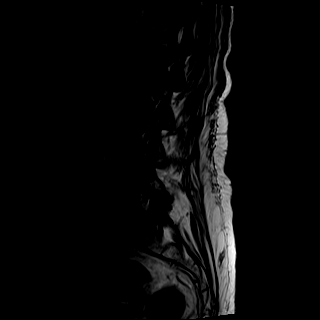
[im 7/16]
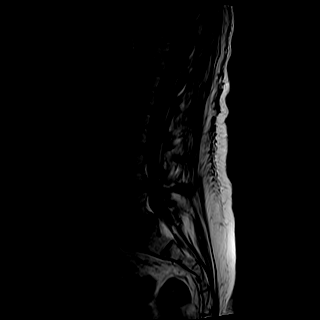
[im 10/16]
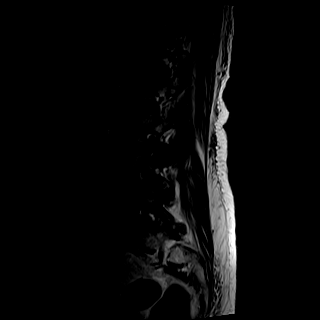
[im 13/16]
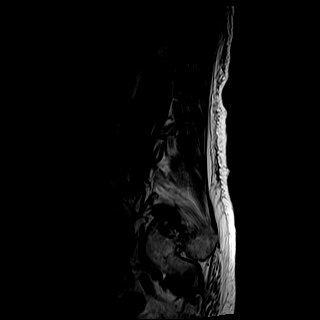
[im 16/16]
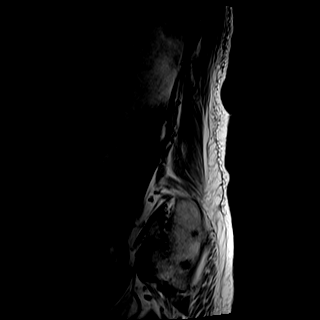

[Series 5: T1 · axial · 4.0mm · 0.34mm/px · z∈[-102,+89]mm · 5 of 37 slices shown (2 of 2)]
[im 1/37]
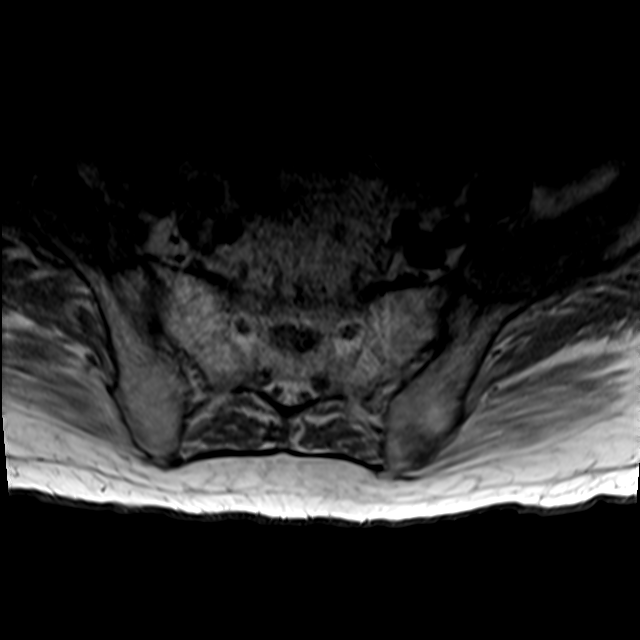
[im 6/37]
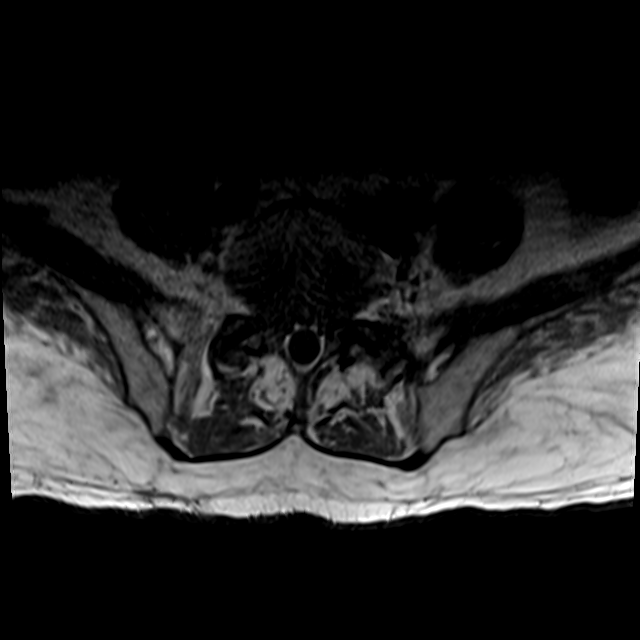
[im 11/37]
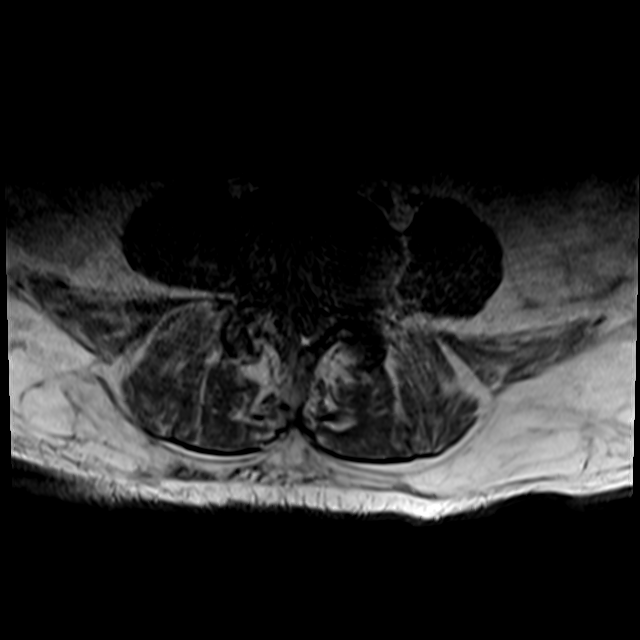
[im 19/37]
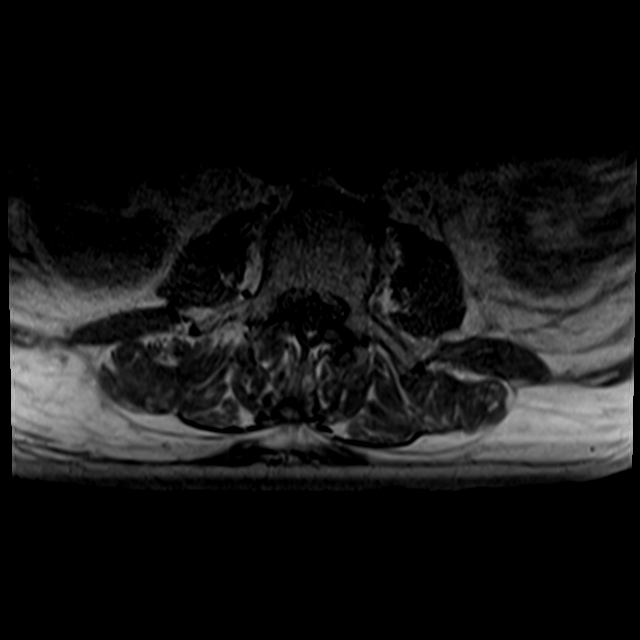
[im 31/37]
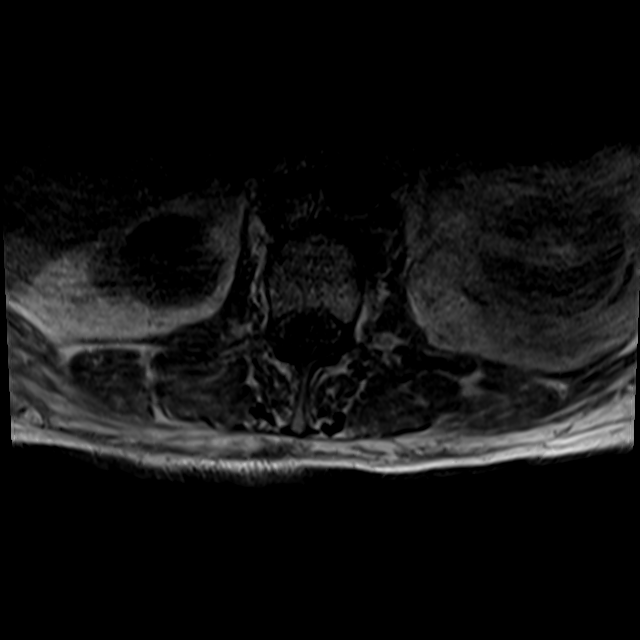

[Series 6: T2 · axial · 4.0mm · 0.57mm/px · z∈[-102,+119]mm · 9 of 37 slices shown (2 of 2)]
[im 1/37]
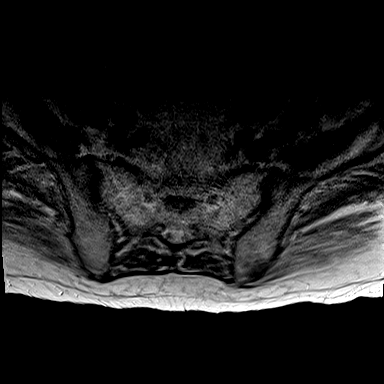
[im 6/37]
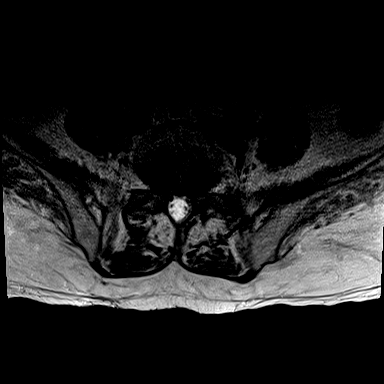
[im 11/37]
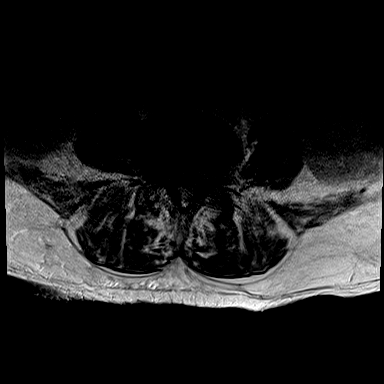
[im 16/37]
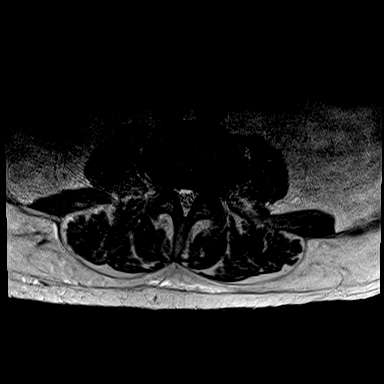
[im 19/37]
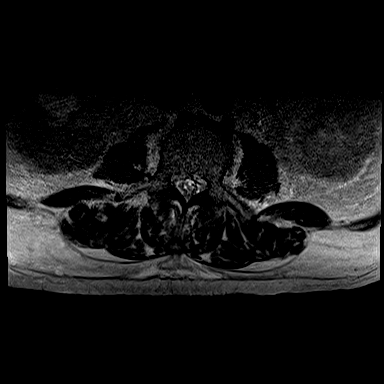
[im 21/37]
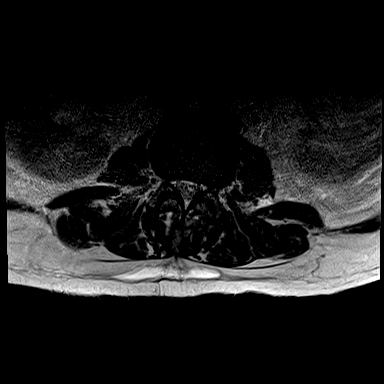
[im 26/37]
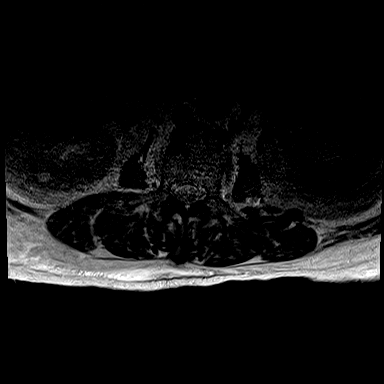
[im 31/37]
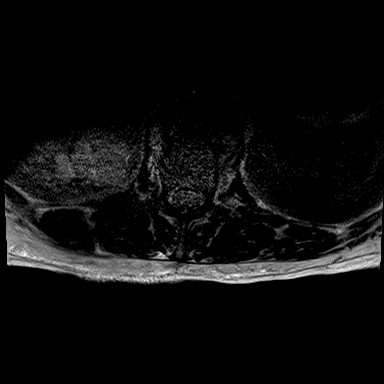
[im 37/37]
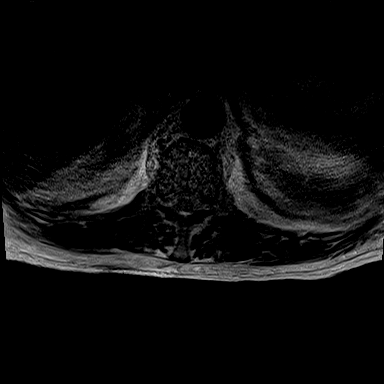

[26 of 48 positions shown; findings below may reference images not displayed]

FINDINGS: Degraded by motion artifact.

Segmentation: Standard.

Alignment:  No significant listhesis.

Vertebrae: Mild degenerative plate irregularity. There are scattered
small foci of low T1 and T2 signal corresponding to sclerotic
metastases on prior imaging.

Conus medullaris and cauda equina: Conus extends to the L1 level.
Conus and cauda equina appear normal.

Paraspinal and other soft tissues: Better evaluated on recent CT
imaging.

Disc levels: Congenital narrowing of the spinal canal.

L1-L2: Disc bulge. Mild facet arthropathy. Moderate canal stenosis.
Mild foraminal stenosis.

L2-L3: Disc bulge. Mild facet arthropathy. No significant canal
stenosis. Minor foraminal stenosis.

L3-L4: Disc bulge. Facet arthropathy. Mild canal stenosis. Partial
effacement of the subarticular recesses. Mild right and mild to
moderate left foraminal stenosis.

L4-L5: Disc bulge. Facet arthropathy with ligamentum flavum
infolding. Marked canal stenosis. Effacement of the subarticular
recesses. Moderate foraminal stenosis.

L5-S1: Disc bulge. Facet arthropathy. No canal stenosis. Mild to
moderate foraminal stenosis.
IMPRESSION: Multilevel degenerative changes superimposed on congenital narrowing
of the spinal canal. There is marked canal stenosis at L4-L5 with
effacement of subarticular recesses and crowding of the cauda equina
nerve roots. Foraminal narrowing is also greatest at L4-L5.

Scattered foci of abnormal signal corresponding to sclerotic
metastases on prior imaging. The requested T11 level was
inadvertently not included. However, there is no evidence of
extraosseous extension on the prior CT abdomen.

## 2021-06-15 MED ORDER — IPRATROPIUM-ALBUTEROL 0.5-2.5 (3) MG/3ML IN SOLN
3.0000 mL | RESPIRATORY_TRACT | Status: DC | PRN
  Administered 2021-06-15 – 2021-06-19 (×4): 3 mL via RESPIRATORY_TRACT
  Filled 2021-06-15 (×3): qty 3

## 2021-06-15 MED ORDER — SODIUM ZIRCONIUM CYCLOSILICATE 10 G PO PACK
10.0000 g | PACK | Freq: Once | ORAL | Status: AC
  Administered 2021-06-15: 10 g via ORAL
  Filled 2021-06-15: qty 1

## 2021-06-15 MED ORDER — ALECTINIB HCL 150 MG PO CAPS: 600.0000 mg | ORAL_CAPSULE | Freq: Two times a day (BID) | ORAL | Status: DC

## 2021-06-15 MED ORDER — CLOPIDOGREL BISULFATE 75 MG PO TABS
75.0000 mg | ORAL_TABLET | Freq: Every day | ORAL | Status: DC
  Administered 2021-06-15 – 2021-06-25 (×11): 75 mg via ORAL
  Filled 2021-06-15 (×11): qty 1

## 2021-06-15 MED ORDER — HYDROCODONE-ACETAMINOPHEN 5-325 MG PO TABS
1.0000 | ORAL_TABLET | Freq: Four times a day (QID) | ORAL | Status: DC | PRN
  Administered 2021-06-19 – 2021-06-21 (×5): 1 via ORAL
  Filled 2021-06-15 (×5): qty 1

## 2021-06-15 MED ORDER — SODIUM CHLORIDE 0.9 % IV SOLN: INTRAVENOUS | Status: DC

## 2021-06-15 MED ORDER — ALBUTEROL SULFATE (2.5 MG/3ML) 0.083% IN NEBU
2.5000 mg | INHALATION_SOLUTION | RESPIRATORY_TRACT | Status: DC
  Administered 2021-06-15 – 2021-06-16 (×3): 2.5 mg via RESPIRATORY_TRACT
  Filled 2021-06-15 (×3): qty 3

## 2021-06-15 MED ORDER — ALECTINIB HCL 150 MG PO CAPS
600.0000 mg | ORAL_CAPSULE | Freq: Two times a day (BID) | ORAL | Status: DC
  Filled 2021-06-15 (×3): qty 4

## 2021-06-15 MED ORDER — PREGABALIN 25 MG PO CAPS
50.0000 mg | ORAL_CAPSULE | Freq: Two times a day (BID) | ORAL | Status: DC | PRN
  Administered 2021-06-17 – 2021-06-20 (×2): 50 mg via ORAL
  Filled 2021-06-15 (×2): qty 2

## 2021-06-15 MED ORDER — SODIUM CHLORIDE 0.9 % IV BOLUS
500.0000 mL | Freq: Once | INTRAVENOUS | Status: AC
  Administered 2021-06-15: 500 mL via INTRAVENOUS

## 2021-06-15 MED ORDER — SODIUM CHLORIDE 0.9 % IV SOLN
INTRAVENOUS | Status: DC
Start: 2021-06-15 — End: 2021-06-16

## 2021-06-15 MED ORDER — SODIUM ZIRCONIUM CYCLOSILICATE 10 G PO PACK
10.0000 g | PACK | Freq: Once | ORAL | Status: AC
Start: 2021-06-15 — End: 2021-06-15
  Administered 2021-06-15: 10 g via ORAL
  Filled 2021-06-15: qty 1

## 2021-06-15 NOTE — ED Notes (Signed)
Returned from ct at this time ?

## 2021-06-15 NOTE — ED Triage Notes (Signed)
BIB [facility] from home. Woke up from sleep with COB. Went to bed and was fine. When sitting up he has no SOB when laying flat or exertion he has SOB. Marland Kitchen Hx of lung cancer and is on po chemo. Currently being treated for a DVT in left lower leg with lovenox. Pt has had this happen previously. Lungs clear. PIV 18ga Lt AC.

## 2021-06-15 NOTE — ED Notes (Signed)
Pt transported to ultrasound.

## 2021-06-15 NOTE — Progress Notes (Addendum)
[facility] Interim Progress Note  S:Feeling okay, mildly dyspneic but saturating well  O: BP (!) 148/73 (BP Location: Right Arm)   Pulse 89   Temp 98.2 F (36.8 C) (Oral)   Resp 18   Ht 5\' 11"  (1.803 m)   Wt 73.5 kg   SpO2 98%   BMI 22.59 kg/m    Unable to elicit patellar reflexes on exam   A/P: L foot drop  L gluteal mass -MRI lumbar spine-will add comment to include T11 as well given previous sclerotic lesion -MRI thigh of L gluteal mass    Gerrit Heck, MD 06/15/2021, 3:05 PM PGY-1, [facility] pager [phone]

## 2021-06-15 NOTE — Telephone Encounter (Addendum)
PCP Hospital Visit  Saw patient in the ER.  Spoke with family   He has marked weakness with flex and ext on L ankle that I am sure was not present at last visit on 4/19.  The foot and leg are less swollen and without the induration he had on 4/19.  Given the L gluteus mass seen on CT from 5/15  I agree with imaging the spine and L thigh  He seems to be back a baseline as far as oxygenation goes  Appreciate care of our inpt team   [place]

## 2021-06-15 NOTE — ED Notes (Signed)
Admit provider at bedside 

## 2021-06-15 NOTE — Hospital Course (Addendum)
SOB likely 2/2 worsening lung malignancy/pulmonary edema from renal failure/anemia  Hx PE s/p IVC filter 05/09/2021 Sudden onset SOB on admission. Non-Con CT chest showed progression of malignant disease w/ slight enlargement of RLL nodule and rapidly progressive pleural metastases bilaterally (R>L) and no evidence of PE. Oncology was consulted and believed unlikely to have significant disease progression of lung cancer within 4 days on very effective treatment-plan for PET scan outpatient. Alectinib was resumed. CXR showed pulmonary edema. [name] shortness of breath improved with diuretics and likely 2/2 to renal failure. Anemia remained stable (discussed below).    MSSA bacteremia Blood cx positive for MSSA on 5/24. Pt also became febrile to 101. Vanc and cefpime started (5/24). ID consulted who switched patient to cefazolin (5/24-). Echo showed 65-70% EF without significant valvular issues. ID did not recommend TEE. Nidus likely 2/2 to PIV site on left arm that developed warm, erythematous and tender site. Upper extremity doppler showed superficial thrombophlebitis. Repeat cultures were negative for >48 hours and PICC line was ordered as ID recommended Cefazolin for 4 weeks. At discharge ***.  Acute renal failure 2/2 to contrast induced ATN Cr 4.73 on admission with baseline around 1's (Got as high as 6.13 in [place]). Recent CT with contrast likely provoking factor causing ATN. IVF was intiated and nephology was consulted. Nephrology provided bicarbonate with fluids and then transitioned to lasix given patients fluid overload.  FeNa was 1.4% consistent with ATN. Renal ultrasound showed no hydronephrosis.  ATN entered diuretic phase and lasix was stopped. With supportive management creatinine at discharge was ***   Left foot drop Sensation lost on left foot and unable to dorsiflex or plantarflex. MRI femur showed large well-defined fluid collection within the posterior consistent with a hematoma and  Small-moderate-sized Baker's cyst. MRI lumbar spine showed marked canal stenosis at L4-L5 with  effacement of subarticular recesses and crowding of the cauda equina nerve roots. Foraminal narrowing is also greatest at L4-L5. Findings on exam most consistent with superficial peroneal nerve issue. PT recommended home health PT/OT.  Normocytic anemia  Likely due to recent blood loss (thigh hematoma while on enoxaparin) and chronic disease. LDH 220, haptoglobin <10, Fe of 16. Iron supplement started inpatient.   Bilateral DVT  Hematoma  Status post IVC filter placement Currently off anticoagulation with Lovenox because of the bleeding issues in the lower extremities in past.  He is only on treatment with Plavix. Iron tablet was started inpatient. VVS recommended compression stockings, foot drop splint and patient had no signs of compartment syndrome and there were no surgical interventions recommended.  Continued goals of care discussions Recommend monitoring anemia given hematoma in LE through CBC Monitor renal function on BMP Ensure continuing cefazolin for 4 weeks Outpatient PET scan per oncology

## 2021-06-15 NOTE — Progress Notes (Incomplete)
RT called to patients room due to increased WOB, SOB and audible wheezing. Gave HHN and scheduled Q4 breathing treatments.  Called MD who confirmed night shift MD would be up to assess patient.  Pt SPO2 97% on room air.

## 2021-06-15 NOTE — H&P (Signed)
[facility] Admission History and Physical Service Pager: [phone]  Patient name: Arthur [name] Medical record number: [id number] Date of birth: Apr 16, 1940 Age: 81 y.o. Gender: male  Primary Care Provider: Lind Covert, MD Consultants: None Code Status: Full  Preferred Emergency Contact:  Contact Information     Name Relation Home Work Mobile   Huynh,Vipul Son   [place] Daughter   [phone]   Culliton,Shital Daughter   [phone]        Chief Complaint: SOB  Assessment and Plan: Arthur [name] is a 81 y.o. male presenting with SOB. PMH is significant for stage IV lung cancer currently on oral chemotherapy, hx PE/DVT on Plavis, hx CVA, CAD s/p stenting, CKD, hyperlipidemia, GERD.  SOB likely 2/2 worsening lung malignancy vs. PE  Hx PE s/p IVC filter 05/09/2021 Sudden onset SOB x 1 hr PTA  Very unfortunately, his non-con CT chest today showed progression of malignant disease w/ slight enlargement of RLL nodule and rapidly progressive pleural metastases bilaterally (R>L) likely most contributing to SOB No hypoxia or tachycardia although still w/ clinical suspicion for PE given hx PE and only on Plavix (had hematoma of LE on both Eliquis and Lovenox, had to be d/c'ed) in setting of malignancy - Admit to FPTS Progressive, attending Dr. Ardelia Mems - Discuss w/ oncologist - Discuss w/ radiology on best scan of choice to rule-in or rule-out PE - Continue home Plavix - Consider palliative medicine consult - VVS consult for lower extremity swelling  Stage IV non-small cell lung adenocarcinoma Undergoing tx w/ chemotherapy: Alecensa (Alectinib) 600mg  PO BID CT scan chest, a/p showed significant improvement in disease (although not the case on today's imaging) s/p 2 months chemo treatment as above, per oncology note on 5/17 - Discuss with patient's oncologist (Dr. Earlie Server at [facility]) regarding this  hospitalization/medication and renal function - Continue home hydrocodone-acetaminophen 5-325 q6h PRN  Acute renal failure  Hyperkalemia Cr 4.73, baseline 1.1-1.5.  GFR 12. Possibly drug-induced 2/2 chemotherapeutic agent vs contrast he received for CT 2 days prior.  K+ 5.2, will monitor closely - Consider gentle hydration - Avoid nephrotoxic agents - Re-check BMP this afternoon - Lokelma 10g x1  CADs/p PCI with DES 02/16/2021 - Continue Plavix - Continue statin  Gout No acute flare - Hold colchicine - Hold allupurinol  T2DM - Hold home Metformin and Jardiance - sSSI - Monitor CBG  HTN BP well-controlled - Hold home antihypertensives, resume as able  FEN/GI: Regular diet Prophylaxis: SCD's, no pharmacologic ppx   Disposition: Progressive  History of Present Illness:  Arthur [name] is a 81 y.o. male brought in by EMS with acute onset shortness of breath just PTA.  Patient presents with son and wife bedside. Endorses feeling short of breath that started last night and woke him up out of his sleep but has now resolved. Son says he was seen by oncologist 2 days ago and CT showed improvement. Was told that his kidneys would be affected because of the contrast from the CT.   Denies current CP, SOB. Currently taking plavix  Eating and drinking well. No coughing up sputum and blood   Lives with son. Ambulates with walker due to R foot numbness   Denies tobacco use, alcohol use, recreational drug use   Review Of Systems: Per HPI with the following additions:   Review of Systems  Constitutional:  Negative for chills and fever.  Respiratory:  Positive for shortness of breath. Negative  for cough.   Cardiovascular:  Positive for leg swelling. Negative for chest pain.  Gastrointestinal:  Negative for abdominal pain, constipation and diarrhea.    Patient Active Problem List   Diagnosis Date Noted   Encounter for antineoplastic chemotherapy 06/13/2021   Deep venous  embolism and thrombosis ([employer]) 05/16/2021   Groin swelling 05/16/2021   Adenocarcinoma of right lung, stage 4 ([place]) 04/09/2021   Cerebral embolism with cerebral infarction 03/18/2021   Adenopathy 03/06/2021   History of cardiac arrest 02/19/2021   Status post coronary artery stent placement    Coronary artery disease involving native heart    Pulmonary nodules/lesions, multiple    Pulmonary embolus ([place]) 02/03/2021   Anemia 09/06/2015   GERD (gastroesophageal reflux disease) 07/05/2013   Arthritis 02/26/2010   Gout 11/06/2009   Diabetes mellitus, type II ([place]) 09/22/2009   Hyperlipidemia 09/22/2009   Essential hypertension, benign 09/22/2009    Past Medical History: Past Medical History:  Diagnosis Date   Diabetes mellitus without complication ([place])    High cholesterol    Hypertension    lung ca    Normal nuclear stress test 01/29/2008   stress perfusion study apparently in 2010 in [facility] which he said was negative.    Past Surgical History: Past Surgical History:  Procedure Laterality Date   CATARACT EXTRACTION, BILATERAL  2006   CORONARY STENT INTERVENTION N/A 02/06/2021   Procedure: CORONARY STENT INTERVENTION;  Surgeon: Jettie Booze, MD;  Location: [facility];  Service: Cardiovascular;  Laterality: N/A;   FINE NEEDLE ASPIRATION  03/21/2021   Procedure: FINE NEEDLE ASPIRATION;  Surgeon: Garner Nash, DO;  Location: [facility];  Service: Pulmonary;;   INTRAVASCULAR ULTRASOUND/IVUS N/A 02/06/2021   Procedure: Intravascular Ultrasound/IVUS;  Surgeon: Jettie Booze, MD;  Location: [facility];  Service: Cardiovascular;  Laterality: N/A;   KNEE SURGERY  2005   LEFT HEART CATH AND CORONARY ANGIOGRAPHY N/A 02/06/2021   Procedure: LEFT HEART CATH AND CORONARY ANGIOGRAPHY;  Surgeon: Jettie Booze, MD;  Location: [facility];  Service: Cardiovascular;  Laterality: N/A;   VIDEO BRONCHOSCOPY WITH ENDOBRONCHIAL ULTRASOUND  Bilateral 03/21/2021   Procedure: VIDEO BRONCHOSCOPY WITH ENDOBRONCHIAL ULTRASOUND;  Surgeon: Garner Nash, DO;  Location: [facility];  Service: Pulmonary;  Laterality: Bilateral;    Social History: Social History   Tobacco Use   Smoking status: Never   Smokeless tobacco: Never  Substance Use Topics   Alcohol use: No   Drug use: No   Family History: Family History  Problem Relation Age of Onset   Coronary artery disease Father    Coronary artery disease Mother     Allergies and Medications: Allergies  Allergen Reactions   Ace Inhibitors     REACTION: Cough   No current facility-administered medications on file prior to encounter.   Current Outpatient Medications on File Prior to Encounter  Medication Sig Dispense Refill   acetaminophen (TYLENOL) 650 MG CR tablet Take 650 mg by mouth every 8 (eight) hours as needed for pain.     alectinib (ALECENSA) 150 MG capsule Take 4 capsules (600 mg total) by mouth 2 (two) times daily with a meal. 240 capsule 3   allopurinol (ZYLOPRIM) 100 MG tablet Take 1 tablet (100 mg total) by mouth daily. 30 tablet 6   clopidogrel (PLAVIX) 75 MG tablet Take 1 tablet (75 mg total) by mouth daily. 90 tablet 1   colchicine 0.6 MG tablet Take 1/2 tablet (0.3 mg total) by  mouth daily. 30 tablet 1   diclofenac Sodium (VOLTAREN) 1 % GEL Apply 2 g topically 4 (four) times daily. (Patient taking differently: Apply 2-4 g topically 4 (four) times daily as needed (knees/legs).) 50 g 1   empagliflozin (JARDIANCE) 10 MG TABS tablet Take 1 tablet (10 mg total) by mouth daily. 90 tablet 0   ferrous sulfate 324 (65 Fe) MG TBEC Take 324 mg by mouth daily. 30 tablet    HYDROcodone-acetaminophen (NORCO/VICODIN) 5-325 MG tablet Take 1 tablet by mouth every 6 (six) hours as needed.     losartan (COZAAR) 25 MG tablet Take 1 tablet (25 mg total) by mouth daily. 90 tablet 0   metFORMIN (GLUCOPHAGE) 1000 MG tablet Take 1 tablet (1,000 mg total) by mouth 2 (two) times  daily with a meal. 180 tablet 3   polyethylene glycol powder (GLYCOLAX/MIRALAX) 17 GM/SCOOP powder Take 17 g by mouth daily. 3350 g 1   pregabalin (LYRICA) 50 MG capsule Take 50 mg by mouth 2 (two) times daily as needed. As needed     rosuvastatin (CRESTOR) 40 MG tablet Take 1 tablet (40 mg total) by mouth daily. 90 tablet 0   trolamine salicylate (ASPER-FLEX) 10 % cream Apply 1 application topically as needed for muscle pain. 85 g 0    Objective: BP 134/68   Pulse 83   Temp 97.7 F (36.5 C) (Oral)   Resp 17   Ht 5\' 11"  (1.803 m)   Wt 73.5 kg   SpO2 100%   BMI 22.59 kg/m  Exam: General: Elderly gentleman laying in bed in no distress, very pleasant, smiles Eyes: Pupils PERRLA. No scleral icterus ENTM: MMM, oropharynx clear Neck: JVD distention Cardiovascular: RRR, no murmurs appreciated Respiratory: Normal WOB, on room air. Diminished breath sounds throughout Gastrointestinal: Abdomen soft, NTND, BS present MSK: Significant swelling in lower extremities L >R, firmness in popliteal region of left leg from resolving hematoma, no calf tenderness to palpation Derm: No rashes or lesions Neuro: Awake, alert, no focal neuro deficits Psych: Normal mood and affect  Labs and Imaging: CBC BMET  Recent Labs  Lab 06/15/21 0400 06/15/21 0413  WBC 9.5  --   HGB 9.6* 9.5*  HCT 30.1* 28.0*  PLT 318  --    Recent Labs  Lab 06/11/21 1350 06/15/21 0413  NA 137 135  K 4.3 5.1  CL 108 110  CO2 23  --   BUN 22 43*  CREATININE 1.83* 5.00*  GLUCOSE 99 84  CALCIUM 8.5*  --      EKG: NSR w/ PVCs   Orvis Brill, DO 06/15/2021, 4:41 AM PGY-1, [place] [facility] pager: [phone], text pages welcome

## 2021-06-15 NOTE — Progress Notes (Addendum)
FPTS Interim Progress Note  S:Updated patient at bedside that CT scan showed possible worsening of cancer and that we have contacted his medical oncology office for recommendations and opinions on scan.  Says that he has been having left foot weakness with sensation loss unable to lift the foot up or down and says that this is been going on for around 15 days.  O: BP (!) 141/59   Pulse 80   Temp 98.3 F (36.8 C) (Axillary)   Resp 14   Ht 5\' 11"  (1.803 m)   Wt 73.5 kg   SpO2 98%   BMI 22.59 kg/m    General: NAD, awake, alert, responsive to questions Head: Normocephalic atraumatic CV: Regular rate and rhythm no murmurs rubs or gallops Respiratory: Clear to ausculation bilaterally, no wheezes rales or crackles, mild dyspnea, speaking full sentences Abdomen: Soft, non-tender, non-distended, normoactive bowel sounds  Extremities: Left foot unable to dorsiflex or plantarflex, sensation not intact in left foot, 2+ pulse in LE, brisk cap refill Neuro: No focal deficits Skin: No rashes or lesions visualized   A/P: Dyspnea -No PE seen on noncontrast CT today, can consider a V/Q scan -Spoke with on-call medical oncologist Dr. Benay Spice who says that the progression should not be causing his acute shortness of breath, said to contact him again if anything happens today but plan for Dr. Earlie Server who follows up with him outpatient to see him tomorrow in the hospital for more discussion  Acute renal failure Renal ultrasound ordered showed Mildly increased parenchymal echogenicity bilaterally suggestive of medical renal disease. No hydronephrosis. Cr 4.73 yesterday>5.00 this AM. -MIVF  -consider nephology consult if worsening-RFP this 2 pm -FeNa, UA  Left foot drop Last DVT ultrasound in February 2023 showed known bilateral DVTs could possible be secondary to this.  IVC filter in place. Dneies any pain and has good ciruclation -will check patellar reflex/neuro exam  -will consider brain MRI or  lumbar MRI w/o contrast after neurologic exam  Gerrit Heck, MD 06/15/2021, 8:37 AM PGY-1, [place] Service pager [phone]   Discussed with Dr. Jinny Sanders.  [name] dyspnea improved today.  Given her inability to anticoagulate him further with his history of hematoma and bleeding will not pursue VQ scan imaging unless his symptoms were to change.   Additionally agree with MRI lumbar spine without contrast given the thigh lesion we will also obtain MRI thigh.  Monitor left lower extremity closely clinical symptoms change we will consult vascular surgery.

## 2021-06-15 NOTE — Progress Notes (Signed)
FPTS Brief Progress Note  S:Patient awake with RN in room when I examined him. He endorses feeling well, states his breathing has improved.    O: BP (!) 133/59 (BP Location: Right Arm)   Pulse 96   Temp 97.6 F (36.4 C) (Oral)   Resp 17   Ht 5\' 11"  (1.803 m)   Wt 73.5 kg   SpO2 96%   BMI 22.59 kg/m   Physical exam General: well appearing, pleasant, NAD Cardiovascular: RRR, no murmurs Lungs: Normal WOB on RA Abdomen: soft, non-distended, non-tender Skin: warm, dry  A/P:  SOB 2/2 lung malignancy  Patient with stage IV non-small cell lung adenocarcinoma. CT showed progression of malignant disease w/ slight enlargement of RLL nodule and rapidly progressive pleural metastases bilaterally (R>L) - f/u onc recs  - if worsens will consider v/q scan - continue home hydrocodone-acetaminophen 5-325 q6h PRN  - holding chemo med Alecensa   Acute renal failure  Hyperkalemia Likely in the setting of his chemo medication and receiving contrast recently. Cr 4.93, GFR 11. K+ 5.9 >5.4 this evening.  - gave Lokelma 10g x2 this evening - recommend consulting Nephro in am   - Orders reviewed. Labs for AM ordered, which was adjusted as needed.   Remainder of plan per day team's note   Shary Key, DO 06/15/2021, 10:32 PM PGY-2, [facility] Night Resident  Please page [phone] with questions.

## 2021-06-15 NOTE — ED Provider Notes (Addendum)
[facility] EMERGENCY DEPARTMENT Provider Note   CSN: [id number] Arrival date & time: 06/15/21  6811     History  Chief Complaint  Patient presents with   Shortness of Breath    Arthur [name] is a 81 y.o. male.  The history is provided by the patient and the EMS personnel.  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  No f/c/r.      Home Medications Prior to Admission medications   Medication Sig Start Date End Date Taking? Authorizing Provider  acetaminophen (TYLENOL) 650 MG CR tablet Take 650 mg by mouth every 8 (eight) hours as needed for pain.    [provider]  alectinib (ALECENSA) 150 MG capsule Take 4 capsules (600 mg total) by mouth 2 (two) times daily with a meal. 04/09/21   Curt Bears, MD  allopurinol (ZYLOPRIM) 100 MG tablet Take 1 tablet (100 mg total) by mouth daily. 03/13/21   Lind Covert, MD  clopidogrel (PLAVIX) 75 MG tablet Take 1 tablet (75 mg total) by mouth daily. 04/04/21   Lind Covert, MD  colchicine 0.6 MG tablet Take 1/2 tablet (0.3 mg total) by mouth daily. 03/22/21   Erskine Emery, MD  diclofenac Sodium (VOLTAREN) 1 % GEL Apply 2 g topically 4 (four) times daily. Patient taking differently: Apply 2-4 g topically 4 (four) times daily as needed (knees/legs). 01/31/21   Lind Covert, MD  empagliflozin (JARDIANCE) 10 MG TABS tablet Take 1 tablet (10 mg total) by mouth daily. 04/04/21   Lind Covert, MD  ferrous sulfate 324 (65 Fe) MG TBEC Take  324 mg by mouth daily. 08/17/20   Lind Covert, MD  HYDROcodone-acetaminophen (NORCO/VICODIN) 5-325 MG tablet Take 1 tablet by mouth every 6 (six) hours as needed. 05/07/21   [provider]  losartan (COZAAR) 25 MG tablet Take 1 tablet (25 mg total) by mouth daily. 04/04/21   Lind Covert, MD  metFORMIN (GLUCOPHAGE) 1000 MG tablet Take 1 tablet (1,000 mg total) by mouth 2 (two) times daily with a meal. 01/31/21   Chambliss, Jeb Levering, MD  polyethylene glycol powder (GLYCOLAX/MIRALAX) 17 GM/SCOOP powder Take 17 g by mouth daily. 04/23/21   Holley Bouche, MD  pregabalin (LYRICA) 50 MG capsule Take 50 mg by mouth 2 (two) times daily as needed. As needed 05/07/21   [provider]  rosuvastatin (CRESTOR) 40 MG tablet Take 1 tablet (40 mg total) by mouth daily. 04/04/21   Lind Covert, MD  trolamine salicylate (ASPER-FLEX) 10 % cream Apply 1 application topically as needed for muscle pain. 04/08/18   Lind Covert, MD      Allergies    Ace inhibitors    Review of Systems   Review of Systems  Constitutional:  Negative for fever.  HENT:  Negative for facial swelling.   Eyes:  Negative for redness.  Respiratory:  Positive for shortness of breath.   Cardiovascular:  Positive for leg swelling and PND. Negative for chest pain.  Gastrointestinal:  Negative for  vomiting.  Musculoskeletal:  Negative for neck stiffness.   Physical Exam Updated Vital Signs BP 134/68   Pulse 83   Temp 97.7 F (36.5 C) (Oral)   Resp 17   Ht 5\' 11"  (1.803 m)   Wt 73.5 kg   SpO2 100%   BMI 22.59 kg/m  Physical Exam Vitals and nursing note reviewed.  Constitutional:      Appearance: Normal appearance. He is not diaphoretic.  HENT:     Head: Normocephalic and atraumatic.     Nose: Nose normal.  Eyes:     Conjunctiva/sclera: Conjunctivae normal.     Pupils: Pupils are equal, round, and reactive to light.  Cardiovascular:     Rate and Rhythm: Normal rate and regular  rhythm.     Pulses: Normal pulses.     Heart sounds: Normal heart sounds.  Pulmonary:     Effort: Pulmonary effort is normal. No respiratory distress.     Breath sounds: Normal breath sounds. No wheezing or rales.  Abdominal:     General: Bowel sounds are normal.     Palpations: Abdomen is soft.     Tenderness: There is no abdominal tenderness. There is no guarding.  Musculoskeletal:        General: No deformity.     Cervical back: Normal range of motion and neck supple.  Skin:    General: Skin is warm and dry.     Capillary Refill: Capillary refill takes less than 2 seconds.  Neurological:     General: No focal deficit present.     Mental Status: He is alert.     Deep Tendon Reflexes: Reflexes normal.  Psychiatric:        Mood and Affect: Mood normal.        Behavior: Behavior normal.    ED Results / Procedures / Treatments   Labs (all labs ordered are listed, but only abnormal results are displayed) Results for orders placed or performed during the hospital encounter of 06/15/21  CBC with Differential  Result Value Ref Range   WBC 9.5 4.0 - 10.5 K/uL   RBC 3.21 (L) 4.22 - 5.81 MIL/uL   Hemoglobin 9.6 (L) 13.0 - 17.0 g/dL   HCT 30.1 (L) 39.0 - 52.0 %   MCV 93.8 80.0 - 100.0 fL   MCH 29.9 26.0 - 34.0 pg   MCHC 31.9 30.0 - 36.0 g/dL   RDW 17.2 (H) 11.5 - 15.5 %   Platelets 318 150 - 400 K/uL   nRBC 0.0 0.0 - 0.2 %   Neutrophils Relative % 45 %   Neutro Abs 4.3 1.7 - 7.7 K/uL   Lymphocytes Relative 43 %   Lymphs Abs 4.1 (H) 0.7 - 4.0 K/uL   Monocytes Relative 9 %   Monocytes Absolute 0.9 0.1 - 1.0 K/uL   Eosinophils Relative 2 %   Eosinophils Absolute 0.2 0.0 - 0.5 K/uL   Basophils Relative 1 %   Basophils Absolute 0.1 0.0 - 0.1 K/uL   Immature Granulocytes 0 %   Abs Immature Granulocytes 0.04 0.00 - 0.07 K/uL  I-stat chem 8, ED (not at [facility] or [facility])  Result Value Ref Range   Sodium 135 135 - 145 mmol/L   Potassium 5.1 3.5 - 5.1 mmol/L   Chloride 110 98 - 111  mmol/L   BUN 43 (H) 8 - 23 mg/dL   Creatinine, Ser 5.00 (H) 0.61 - 1.24 mg/dL   Glucose, Bld 84 70 - 99 mg/dL   Calcium,  Ion 1.04 (L) 1.15 - 1.40 mmol/L   TCO2 16 (L) 22 - 32 mmol/L   Hemoglobin 9.5 (L) 13.0 - 17.0 g/dL   HCT 28.0 (L) 39.0 - 52.0 %   CT Chest W Contrast  Result Date: 06/12/2021 CLINICAL DATA:  Primary Cancer Type: Lung Imaging Indication: Assess response to therapy Interval therapy since last imaging? Yes Initial Cancer Diagnosis Date: 03/21/2021; Established by: Biopsy-proven Detailed Pathology: Stage IV non-small cell lung cancer, adenocarcinoma. Primary Tumor location: Right lower lobe. Surgeries: Coronary stent 02/06/2021. Chemotherapy: Yes; Ongoing? Yes; Alecensa daily Immunotherapy? No Radiation therapy? No * Tracking Code: BO * EXAM: CT CHEST, ABDOMEN, AND PELVIS WITH CONTRAST TECHNIQUE: Multidetector CT imaging of the chest, abdomen and pelvis was performed following the standard protocol during bolus administration of intravenous contrast. RADIATION DOSE REDUCTION: This exam was performed according to the departmental dose-optimization program which includes automated exposure control, adjustment of the mA and/or kV according to patient size and/or use of iterative reconstruction technique. CONTRAST:  48mL OMNIPAQUE IOHEXOL 300 MG/ML  SOLN COMPARISON:  02/21/2021 PET-CT.  02/03/2021 CT chest. FINDINGS: CT CHEST FINDINGS Cardiovascular: Coronary, aortic arch, and branch vessel atherosclerotic vascular disease. Aortic and mitral valve calcifications noted. Mediastinum/Nodes: Improved adenopathy noted. Right paratracheal node 0.7 cm in short axis on image 23 series 2, formerly 1.2 cm. AP window lymph node 0.9 cm in short axis on image 26 series 2, formerly 1.7 cm. Left supraclavicular node 0.4 cm in short axis on image 10 series 2, formerly 1.0 cm. Other previously demonstrated regional adenopathy is similarly improved. Lungs/Pleura: The dominant right lower lobe nodule measures 0.9  by 0.6 cm on image 79 series 8, formerly 2.0 by 1.6 cm on 02/21/2021. 0.3 cm left lower lobe nodule on image 121 series 8, formerly 0.9 by 0.8 cm. The previous 1.3 by 1.0 cm right upper lobe nodule shown on PET-CT is no longer readily apparent. The other smaller nodules are likewise not well seen. No new pulmonary nodules are observed. Airway thickening is present, suggesting bronchitis or reactive airways disease. Musculoskeletal: Partially fused right anterior first and second ribs. Subtle deformities of right anterior ribs compatible with prior fractures. Old healed left rib fractures. Scattered sclerotic lesions in the thoracic spine, sternum, and likely in the ribs with substantially increased conspicuity compared to prior exams, likely reflecting treated multifocal osseous metastatic disease. On prior PET-CT there was a proximally 5 mm lesion in the T11 vertebral body, on today's exam this corresponds to a 1.9 by 1.4 cm sclerotic lesion on image 126 of series 8 and there is also a second sclerotic lesion eccentric to the right in the T11 vertebral body. There is a notable degree of sclerosis in the second and third thoracic vertebra and in the left posterior for thoracic vertebra extending into the posterior elements on the left. Multiple other thoracic spine lesions are identified. In light of the soft tissue findings, I suspect that the appearance reflects sclerotic healing response of scattered osseous metastatic disease in the thorax. CT ABDOMEN PELVIS FINDINGS Hepatobiliary: Nonspecific 1.1 cm hypodense lesion inferiorly in the right hepatic lobe on image 69 of series 2, no previous hypermetabolic activity in this vicinity on 02/21/2021 PET-CT. Gallbladder unremarkable. Pancreas: Substantially dilated dorsal pancreatic duct extending to a 1.6 cm in long axis calcification in the vicinity of the pancreatic head. This is a similar appearance to the prior CT data from the PET-CT. Spleen: Unremarkable  Adrenals/Urinary Tract: 1.2 by 1.4 cm right adrenal adenoma, previous noncontrast density -  18 Hounsfield units. Bosniak category 1 cysts of the right kidney. 3 mm right kidney upper pole nonobstructive renal calculus. Nonspecific exophytic lesions from the renal lower poles bilaterally, too small to characterize although statistically likely to be benign. Stomach/Bowel: Suspected large duodenal diverticulum along the descending duodenum. Indirect right inguinal hernia containing loops of ileum without findings of strangulation or obstruction. Prominent stool throughout the colon favors constipation. Vascular/Lymphatic: Atherosclerosis is present, including aortoiliac atherosclerotic disease. Note is made of atheromatous plaque proximally in the superior mesenteric artery, without evidence of overt occlusion. New infrarenal IVC filter. Previously hypermetabolic right gastric, porta hepatis, mesenteric, and retroperitoneal adenopathy has markedly improved. Left periaortic lymph node 0.3 cm short axis on image 77 series 2, formerly hypermetabolic and formerly 1.1 cm in short axis. The other regional adenopathy is similarly markedly reduced. Reproductive: Unremarkable Other: No supplemental non-categorized findings. Musculoskeletal: As in the chest, scattered new sclerotic lesions in the lumbar spine and bony pelvis likely reflecting sclerotic healing response of osseous metastatic lesions. Mild grade 1 degenerative anterolisthesis at L5-S1. Lumbar spondylosis and degenerative disc disease contribute to impingement at L3-4, L4-5, and L5-S1. On the bottom most image 131 of series 2, we demonstrate an abnormal hypodense lesion within or along the anterior margin of the distal gluteus maximus muscle, with lower density center and higher density rim. The visualized portion of this lesion measures up to 3.5 by 2.4 cm although this may only represent the very top of the lesion. This lesion is just posterior to the sciatic  nerve in the proximal thigh. There was previously no lesion in this region on 02/21/2021. IMPRESSION: 1. Marked improvement of the adenopathy in the chest and abdomen with prominent reduction in size of prior pulmonary nodules. 2. Widespread scattered sclerotic lesions in the skeleton compatible with healing response of osseous metastatic disease. 3. New infrarenal IVC filter appears satisfactorily position. 4. New abnormal hypodense lesion along the anterior portion of the inferior gluteus maximus muscle in the upper thigh, only partially included on today's exam. This could reflect hematoma, abscess, or metastatic lesion. This lesion was not present on 02/21/2021. Correlate with patient history, this could be further assessed with dedicated MRI of the left thigh if clinically warranted. 5. Other imaging findings of potential clinical significance: Aortic Atherosclerosis (ICD10-I70.0). Coronary and systemic atherosclerosis. Aortic and mitral valve calcifications. Airway thickening is present, suggesting bronchitis or reactive airways disease. Old bilateral rib fractures. Dilated dorsal pancreatic duct likely attributable to the 1.6 cm in long axis calcification in the pancreatic head which may be from chronic calcific pancreatitis. Right adrenal adenoma. Prominent stool throughout the colon favors constipation. Stable appearance of the large indirect right inguinal hernia containing loops of distal small bowel. Multilevel lower lumbar impingement. Large descending duodenal diverticulum. Electronically Signed   By: Van Clines M.D.   On: 06/12/2021 12:03   CT Abdomen Pelvis W Contrast  Result Date: 06/12/2021 CLINICAL DATA:  Primary Cancer Type: Lung Imaging Indication: Assess response to therapy Interval therapy since last imaging? Yes Initial Cancer Diagnosis Date: 03/21/2021; Established by: Biopsy-proven Detailed Pathology: Stage IV non-small cell lung cancer, adenocarcinoma. Primary Tumor location:  Right lower lobe. Surgeries: Coronary stent 02/06/2021. Chemotherapy: Yes; Ongoing? Yes; Alecensa daily Immunotherapy? No Radiation therapy? No * Tracking Code: BO * EXAM: CT CHEST, ABDOMEN, AND PELVIS WITH CONTRAST TECHNIQUE: Multidetector CT imaging of the chest, abdomen and pelvis was performed following the standard protocol during bolus administration of intravenous contrast. RADIATION DOSE REDUCTION: This exam was performed according to  the departmental dose-optimization program which includes automated exposure control, adjustment of the mA and/or kV according to patient size and/or use of iterative reconstruction technique. CONTRAST:  61mL OMNIPAQUE IOHEXOL 300 MG/ML  SOLN COMPARISON:  02/21/2021 PET-CT.  02/03/2021 CT chest. FINDINGS: CT CHEST FINDINGS Cardiovascular: Coronary, aortic arch, and branch vessel atherosclerotic vascular disease. Aortic and mitral valve calcifications noted. Mediastinum/Nodes: Improved adenopathy noted. Right paratracheal node 0.7 cm in short axis on image 23 series 2, formerly 1.2 cm. AP window lymph node 0.9 cm in short axis on image 26 series 2, formerly 1.7 cm. Left supraclavicular node 0.4 cm in short axis on image 10 series 2, formerly 1.0 cm. Other previously demonstrated regional adenopathy is similarly improved. Lungs/Pleura: The dominant right lower lobe nodule measures 0.9 by 0.6 cm on image 79 series 8, formerly 2.0 by 1.6 cm on 02/21/2021. 0.3 cm left lower lobe nodule on image 121 series 8, formerly 0.9 by 0.8 cm. The previous 1.3 by 1.0 cm right upper lobe nodule shown on PET-CT is no longer readily apparent. The other smaller nodules are likewise not well seen. No new pulmonary nodules are observed. Airway thickening is present, suggesting bronchitis or reactive airways disease. Musculoskeletal: Partially fused right anterior first and second ribs. Subtle deformities of right anterior ribs compatible with prior fractures. Old healed left rib fractures. Scattered  sclerotic lesions in the thoracic spine, sternum, and likely in the ribs with substantially increased conspicuity compared to prior exams, likely reflecting treated multifocal osseous metastatic disease. On prior PET-CT there was a proximally 5 mm lesion in the T11 vertebral body, on today's exam this corresponds to a 1.9 by 1.4 cm sclerotic lesion on image 126 of series 8 and there is also a second sclerotic lesion eccentric to the right in the T11 vertebral body. There is a notable degree of sclerosis in the second and third thoracic vertebra and in the left posterior for thoracic vertebra extending into the posterior elements on the left. Multiple other thoracic spine lesions are identified. In light of the soft tissue findings, I suspect that the appearance reflects sclerotic healing response of scattered osseous metastatic disease in the thorax. CT ABDOMEN PELVIS FINDINGS Hepatobiliary: Nonspecific 1.1 cm hypodense lesion inferiorly in the right hepatic lobe on image 69 of series 2, no previous hypermetabolic activity in this vicinity on 02/21/2021 PET-CT. Gallbladder unremarkable. Pancreas: Substantially dilated dorsal pancreatic duct extending to a 1.6 cm in long axis calcification in the vicinity of the pancreatic head. This is a similar appearance to the prior CT data from the PET-CT. Spleen: Unremarkable Adrenals/Urinary Tract: 1.2 by 1.4 cm right adrenal adenoma, previous noncontrast density -18 Hounsfield units. Bosniak category 1 cysts of the right kidney. 3 mm right kidney upper pole nonobstructive renal calculus. Nonspecific exophytic lesions from the renal lower poles bilaterally, too small to characterize although statistically likely to be benign. Stomach/Bowel: Suspected large duodenal diverticulum along the descending duodenum. Indirect right inguinal hernia containing loops of ileum without findings of strangulation or obstruction. Prominent stool throughout the colon favors constipation.  Vascular/Lymphatic: Atherosclerosis is present, including aortoiliac atherosclerotic disease. Note is made of atheromatous plaque proximally in the superior mesenteric artery, without evidence of overt occlusion. New infrarenal IVC filter. Previously hypermetabolic right gastric, porta hepatis, mesenteric, and retroperitoneal adenopathy has markedly improved. Left periaortic lymph node 0.3 cm short axis on image 77 series 2, formerly hypermetabolic and formerly 1.1 cm in short axis. The other regional adenopathy is similarly markedly reduced. Reproductive: Unremarkable Other: No  supplemental non-categorized findings. Musculoskeletal: As in the chest, scattered new sclerotic lesions in the lumbar spine and bony pelvis likely reflecting sclerotic healing response of osseous metastatic lesions. Mild grade 1 degenerative anterolisthesis at L5-S1. Lumbar spondylosis and degenerative disc disease contribute to impingement at L3-4, L4-5, and L5-S1. On the bottom most image 131 of series 2, we demonstrate an abnormal hypodense lesion within or along the anterior margin of the distal gluteus maximus muscle, with lower density center and higher density rim. The visualized portion of this lesion measures up to 3.5 by 2.4 cm although this may only represent the very top of the lesion. This lesion is just posterior to the sciatic nerve in the proximal thigh. There was previously no lesion in this region on 02/21/2021. IMPRESSION: 1. Marked improvement of the adenopathy in the chest and abdomen with prominent reduction in size of prior pulmonary nodules. 2. Widespread scattered sclerotic lesions in the skeleton compatible with healing response of osseous metastatic disease. 3. New infrarenal IVC filter appears satisfactorily position. 4. New abnormal hypodense lesion along the anterior portion of the inferior gluteus maximus muscle in the upper thigh, only partially included on today's exam. This could reflect hematoma, abscess,  or metastatic lesion. This lesion was not present on 02/21/2021. Correlate with patient history, this could be further assessed with dedicated MRI of the left thigh if clinically warranted. 5. Other imaging findings of potential clinical significance: Aortic Atherosclerosis (ICD10-I70.0). Coronary and systemic atherosclerosis. Aortic and mitral valve calcifications. Airway thickening is present, suggesting bronchitis or reactive airways disease. Old bilateral rib fractures. Dilated dorsal pancreatic duct likely attributable to the 1.6 cm in long axis calcification in the pancreatic head which may be from chronic calcific pancreatitis. Right adrenal adenoma. Prominent stool throughout the colon favors constipation. Stable appearance of the large indirect right inguinal hernia containing loops of distal small bowel. Multilevel lower lumbar impingement. Large descending duodenal diverticulum. Electronically Signed   By: Van Clines M.D.   On: 06/12/2021 12:03   DG Chest Portable 1 View  Result Date: 06/15/2021 CLINICAL DATA:  Shortness of breath EXAM: PORTABLE CHEST 1 VIEW COMPARISON:  CT from 06/11/2021 FINDINGS: Cardiac shadow is within normal limits. Lungs are well aerated bilaterally. No focal infiltrate or sizable effusion is noted. Old healed rib fractures on the left are noted. No acute bony abnormality is seen. IMPRESSION: No acute abnormality noted. Electronically Signed   By: Inez Catalina M.D.   On: 06/15/2021 04:04     EKG  EKG Interpretation  Date/Time:  Friday Jun 15 2021 03:41:54 EDT Ventricular Rate:  91 PR Interval:  246 QRS Duration: 79 QT Interval:  334 QTC Calculation: 411 R Axis:   29 Text Interpretation: Sinus rhythm Multiform ventricular premature complexes Prolonged PR interval Confirmed by Dory Horn) on 06/15/2021 4:37:34 AM        Radiology DG Chest Portable 1 View  Result Date: 06/15/2021 CLINICAL DATA:  Shortness of breath EXAM: PORTABLE CHEST 1  VIEW COMPARISON:  CT from 06/11/2021 FINDINGS: Cardiac shadow is within normal limits. Lungs are well aerated bilaterally. No focal infiltrate or sizable effusion is noted. Old healed rib fractures on the left are noted. No acute bony abnormality is seen. IMPRESSION: No acute abnormality noted. Electronically Signed   By: Inez Catalina M.D.   On: 06/15/2021 04:04    Procedures Procedures    Medications Ordered in ED Medications  sodium chloride 0.9 % bolus 500 mL (has no administration in time range)  ED Course/ Medical Decision Making/ A&P                           Medical Decision Making SOB lying flat in a patient with known lung CA  Amount and/or Complexity of Data Reviewed Independent Historian: EMS    Details: see above External Data Reviewed: labs and notes.    Details: previous notes and labs reviewed Labs: ordered.    Details: all labs reviewed by me: normal sodium 135, normal potassium 5.1 Elevated BUN 43 and creatinine 5.0, this creatinine is new today and well up from patient's baseline. Normal white blood cells 9.5 and hemoglobin low 9.6 platelets 318,000. Radiology: ordered and independent interpretation performed.    Details: no acute finding on CXR by me ECG/medicine tests: ordered and independent interpretation performed. Decision-making details documented in ED Course. Discussion of management or test interpretation with external provider(s): Case d/w FM resident who will admit the patient Case d/w pharmacist, will send information on chemo therapy side effects:  renal impairment in small percentage of patients, dyspnea, PE, and edema  Risk Prescription drug management. Decision regarding hospitalization. Risk Details: Patient will need admission and VQ in the am.  The patient appears reasonably stabilized for admission considering the current resources, flow, and capabilities available in the ED at this time, and I doubt any other [facility] requiring further screening  and/or treatment in the ED prior to admission.     Final Clinical Impression(s) / ED Diagnoses Final diagnoses:  Malignant neoplasm of lung, unspecified laterality, unspecified part of lung ([place])  AKI (acute kidney injury) [facility])    Rx / DC Orders ED Discharge Orders     None         Fitzpatrick Alberico, MD 06/15/21 [place], Alfonse Garringer, MD 06/15/21 0500

## 2021-06-16 ENCOUNTER — Encounter (HOSPITAL_COMMUNITY): Payer: Self-pay | Admitting: Family Medicine

## 2021-06-16 DIAGNOSIS — C3490 Malignant neoplasm of unspecified part of unspecified bronchus or lung: Secondary | ICD-10-CM | POA: Diagnosis not present

## 2021-06-16 DIAGNOSIS — N179 Acute kidney failure, unspecified: Secondary | ICD-10-CM

## 2021-06-16 LAB — RENAL FUNCTION PANEL
Albumin: 2.2 g/dL — ABNORMAL LOW (ref 3.5–5.0)
Anion gap: 10 (ref 5–15)
BUN: 49 mg/dL — ABNORMAL HIGH (ref 8–23)
CO2: 15 mmol/L — ABNORMAL LOW (ref 22–32)
Calcium: 8.3 mg/dL — ABNORMAL LOW (ref 8.9–10.3)
Chloride: 112 mmol/L — ABNORMAL HIGH (ref 98–111)
Creatinine, Ser: 5.31 mg/dL — ABNORMAL HIGH (ref 0.61–1.24)
GFR, Estimated: 10 mL/min — ABNORMAL LOW (ref >60)
Glucose, Bld: 72 mg/dL (ref 70–99)
Phosphorus: 6.7 mg/dL — ABNORMAL HIGH (ref 2.5–4.6)
Potassium: 5.4 mmol/L — ABNORMAL HIGH (ref 3.5–5.1)
Sodium: 137 mmol/L (ref 135–145)

## 2021-06-16 LAB — BASIC METABOLIC PANEL
Anion gap: 7 (ref 5–15)
Anion gap: 10 (ref 5–15)
BUN: 33 mg/dL — ABNORMAL HIGH (ref 8–23)
BUN: 52 mg/dL — ABNORMAL HIGH (ref 8–23)
CO2: 25 mmol/L (ref 22–32)
CO2: 16 mmol/L — ABNORMAL LOW (ref 22–32)
Calcium: 8 mg/dL — ABNORMAL LOW (ref 8.9–10.3)
Calcium: 8.1 mg/dL — ABNORMAL LOW (ref 8.9–10.3)
Chloride: 100 mmol/L (ref 98–111)
Chloride: 109 mmol/L (ref 98–111)
Creatinine, Ser: 1.74 mg/dL — ABNORMAL HIGH (ref 0.61–1.24)
Creatinine, Ser: 6.1 mg/dL — ABNORMAL HIGH (ref 0.61–1.24)
GFR, Estimated: 39 mL/min — ABNORMAL LOW (ref >60)
GFR, Estimated: 9 mL/min — ABNORMAL LOW (ref >60)
Glucose, Bld: 113 mg/dL — ABNORMAL HIGH (ref 70–99)
Glucose, Bld: 106 mg/dL — ABNORMAL HIGH (ref 70–99)
Potassium: 3.8 mmol/L (ref 3.5–5.1)
Potassium: 5.4 mmol/L — ABNORMAL HIGH (ref 3.5–5.1)
Sodium: 132 mmol/L — ABNORMAL LOW (ref 135–145)
Sodium: 135 mmol/L (ref 135–145)

## 2021-06-16 LAB — CBC
HCT: 26.6 % — ABNORMAL LOW (ref 39.0–52.0)
HCT: 25.6 % — ABNORMAL LOW (ref 39.0–52.0)
Hemoglobin: 8.2 g/dL — ABNORMAL LOW (ref 13.0–17.0)
Hemoglobin: 8.3 g/dL — ABNORMAL LOW (ref 13.0–17.0)
MCH: 28.8 pg (ref 26.0–34.0)
MCH: 29.4 pg (ref 26.0–34.0)
MCHC: 30.8 g/dL (ref 30.0–36.0)
MCHC: 32.4 g/dL (ref 30.0–36.0)
MCV: 93.3 fL (ref 80.0–100.0)
MCV: 90.8 fL (ref 80.0–100.0)
Platelets: 274 10*3/uL (ref 150–400)
Platelets: 269 10*3/uL (ref 150–400)
RBC: 2.85 MIL/uL — ABNORMAL LOW (ref 4.22–5.81)
RBC: 2.82 MIL/uL — ABNORMAL LOW (ref 4.22–5.81)
RDW: 17.2 % — ABNORMAL HIGH (ref 11.5–15.5)
RDW: 17 % — ABNORMAL HIGH (ref 11.5–15.5)
WBC: 6.9 10*3/uL (ref 4.0–10.5)
WBC: 7.2 10*3/uL (ref 4.0–10.5)
nRBC: 0 % (ref 0.0–0.2)
nRBC: 0 % (ref 0.0–0.2)

## 2021-06-16 LAB — IRON AND TIBC
Iron: 19 ug/dL — ABNORMAL LOW (ref 45–182)
Saturation Ratios: 6 % — ABNORMAL LOW (ref 17.9–39.5)
TIBC: 323 ug/dL (ref 250–450)
UIBC: 304 ug/dL

## 2021-06-16 LAB — FERRITIN: Ferritin: 74 ng/mL (ref 24–336)

## 2021-06-16 MED ORDER — SODIUM BICARBONATE 8.4 % IV SOLN
INTRAVENOUS | Status: DC
  Filled 2021-06-16 (×2): qty 1000

## 2021-06-16 MED ORDER — POLYETHYLENE GLYCOL 3350 17 G PO PACK
17.0000 g | PACK | Freq: Once | ORAL | Status: AC
  Administered 2021-06-16: 17 g via ORAL
  Filled 2021-06-16: qty 1

## 2021-06-16 MED ORDER — ALECTINIB HCL 150 MG PO CAPS
600.0000 mg | ORAL_CAPSULE | Freq: Two times a day (BID) | ORAL | Status: DC
  Administered 2021-06-16 – 2021-06-25 (×18): 600 mg via ORAL
  Filled 2021-06-16 (×19): qty 4

## 2021-06-16 MED ORDER — POLYETHYLENE GLYCOL 3350 17 G PO PACK
17.0000 g | PACK | Freq: Every day | ORAL | Status: DC
  Filled 2021-06-16 (×2): qty 1

## 2021-06-16 MED ORDER — SODIUM ZIRCONIUM CYCLOSILICATE 10 G PO PACK: 10.0000 g | PACK | Freq: Three times a day (TID) | ORAL | Status: DC

## 2021-06-16 MED ORDER — ALBUTEROL SULFATE (2.5 MG/3ML) 0.083% IN NEBU
2.5000 mg | INHALATION_SOLUTION | Freq: Four times a day (QID) | RESPIRATORY_TRACT | Status: DC
  Administered 2021-06-16 – 2021-06-17 (×3): 2.5 mg via RESPIRATORY_TRACT
  Filled 2021-06-16 (×3): qty 3

## 2021-06-16 MED ORDER — POLYETHYLENE GLYCOL 3350 17 G PO PACK: 17.0000 g | PACK | Freq: Every day | ORAL | Status: DC

## 2021-06-16 MED ORDER — GUAIFENESIN-DM 100-10 MG/5ML PO SYRP
5.0000 mL | ORAL_SOLUTION | ORAL | Status: DC | PRN
  Administered 2021-06-16 – 2021-06-18 (×4): 5 mL via ORAL
  Filled 2021-06-16 (×4): qty 5

## 2021-06-16 MED ORDER — ROSUVASTATIN CALCIUM 5 MG PO TABS
5.0000 mg | ORAL_TABLET | Freq: Every day | ORAL | Status: DC
Start: 2021-06-16 — End: 2021-06-25
  Administered 2021-06-16 – 2021-06-25 (×10): 5 mg via ORAL
  Filled 2021-06-16 (×10): qty 1

## 2021-06-16 MED ORDER — PANTOPRAZOLE SODIUM 40 MG PO TBEC: 40.0000 mg | DELAYED_RELEASE_TABLET | Freq: Every day | ORAL | Status: DC

## 2021-06-16 NOTE — Evaluation (Signed)
Physical Therapy Evaluation Patient Details Name: Arthur [name] MRN: [id number] DOB: 04-09-1940 Today's Date: 06/16/2021  History of Present Illness  81 yo male adm 5/19 with SOB. Rapid worsening of met disease with AKI. PMhx: stage 4 lung CA, type 2 DM, CAD s/p PCI, HLD, HTN, gout, GERD, prior PE  Clinical Impression  PT pleasant and reports living at home with son and wife and able to care for himself. PT has been using RW for about a month since development of left foot drop. Pt without shoes present to trial AFO and requested family bring them for next session. Pt with desaturation to 87% with limited gait of 60' on RA and educated for breathing technique and use of pulse ox at home. MD present end of session and deferred IS education. PT with decreased gait and activity tolerance who will benefit from acute therapy to maximize safety and independence.              SpO2 87-95% on RA with activity        HR 86-104 Recommendations for follow up therapy are one component of a multi-disciplinary discharge planning process, led by the attending physician.  Recommendations may be updated based on patient status, additional functional criteria and insurance authorization.  Follow Up Recommendations Home health PT    Assistance Recommended at Discharge Intermittent Supervision/Assistance  Patient can return home with the following  Assistance with cooking/housework;Assist for transportation    Equipment Recommendations Other (comment) (LLE AFO)  Recommendations for Other Services       Functional Status Assessment Patient has had a recent decline in their functional status and demonstrates the ability to make significant improvements in function in a reasonable and predictable amount of time.     Precautions / Restrictions Precautions Precautions: Fall;Other (comment) Precaution Comments: watch sats, left foot drop      Mobility  Bed Mobility Overal bed mobility: Modified  Independent             General bed mobility comments: increased time and effort with bed flat    Transfers Overall transfer level: Needs assistance   Transfers: Sit to/from Stand Sit to Stand: Min guard           General transfer comment: cues for hand placement to not pull on RW    Ambulation/Gait Ambulation/Gait assistance: Min guard Gait Distance (Feet): 60 Feet Assistive device: Rolling walker (2 wheels) Gait Pattern/deviations: Step-through pattern, Decreased stride length, Decreased dorsiflexion - left   Gait velocity interpretation: 1.31 - 2.62 ft/sec, indicative of limited community ambulator   General Gait Details: pt with left foot drop and compensating with RW use and increased steppage to clear foot. Cues for safety and position in RW with pt limited by fatigue with SpO2 dropping to 87% after limited gait with seated rest grossly 1 min to recover to 95% on RA  Stairs            Wheelchair Mobility    Modified Rankin (Stroke Patients Only)       Balance Overall balance assessment: Needs assistance   Sitting balance-Leahy Scale: Good Sitting balance - Comments: static sitting without UE support   Standing balance support: Bilateral upper extremity supported Standing balance-Leahy Scale: Fair Standing balance comment: RW use of standing, able to static stand at sink without support                             Pertinent  Vitals/Pain Pain Assessment Pain Assessment: No/denies pain    Home Living Family/patient expects to be discharged to:: Private residence Living Arrangements: Spouse/significant other;Children Available Help at Discharge: Family;Available 24 hours/day Type of Home: House Home Access: Stairs to enter   [employer] of Steps: 1   Home Layout: Able to live on main level with bedroom/bathroom;Two level Home Equipment: Shower seat;Hand held Engineering geologist (2 wheels);Cane - single point       Prior Function Prior Level of Function : Driving;Independent/Modified Independent             Mobility Comments: pt has been using RW for last month since development of left foot drop ADLs Comments: shower seat and able to perform ADLs on his own     Hand Dominance        Extremity/Trunk Assessment   Upper Extremity Assessment Upper Extremity Assessment: Overall WFL for tasks assessed    Lower Extremity Assessment Lower Extremity Assessment: Generalized weakness;LLE deficits/detail LLE Deficits / Details: foot drop x 1 month    Cervical / Trunk Assessment Cervical / Trunk Assessment: Other exceptions Cervical / Trunk Exceptions: rounded shoulders  Communication      Cognition Arousal/Alertness: Awake/alert Behavior During Therapy: WFL for tasks assessed/performed Overall Cognitive Status: Within Functional Limits for tasks assessed                                          General Comments      Exercises     Assessment/Plan    PT Assessment Patient needs continued PT services  PT Problem List Decreased activity tolerance;Decreased balance;Decreased strength;Decreased mobility;Decreased knowledge of use of DME       PT Treatment Interventions DME instruction;Gait training;Therapeutic exercise;Functional mobility training;Therapeutic activities;Patient/family education    PT Goals (Current goals can be found in the Care Plan section)  Acute Rehab PT Goals Patient Stated Goal: return to walking without AD, be outside PT Goal Formulation: With patient/family Time For Goal Achievement: 06/30/21 Potential to Achieve Goals: Good    Frequency Min 2X/week     Co-evaluation               AM-PAC PT "6 Clicks" Mobility  Outcome Measure Help needed turning from your back to your side while in a flat bed without using bedrails?: None Help needed moving from lying on your back to sitting on the side of a flat bed without using bedrails?:  None Help needed moving to and from a bed to a chair (including a wheelchair)?: A Little Help needed standing up from a chair using your arms (e.g., wheelchair or bedside chair)?: A Little Help needed to walk in hospital room?: A Little Help needed climbing 3-5 steps with a railing? : A Little 6 Click Score: 20    End of Session Equipment Utilized During Treatment: Gait belt Activity Tolerance: Patient tolerated treatment well Patient left: in chair;with call bell/phone within reach;with family/visitor present (MD present) Nurse Communication: Mobility status PT Visit Diagnosis: Difficulty in walking, not elsewhere classified (R26.2);Muscle weakness (generalized) (M62.81)    Time: 1610-9604 PT Time Calculation (min) (ACUTE ONLY): 28 min   Charges:   PT Evaluation $PT Eval Moderate Complexity: 1 Mod PT Treatments $Therapeutic Activity: 8-22 mins        Addalyne Vandehei P, PT Acute Rehabilitation Services Pager: [phone] Office: [phone]   Kia Stavros B Lolita Faulds 06/16/2021, 10:40 AM

## 2021-06-16 NOTE — Progress Notes (Addendum)
DIAGNOSIS: Stage IV (T1b, N3, M1 C) non-small cell lung cancer, adenocarcinoma with positive ALK gene translocation diagnosed in February 2023.   PRIOR THERAPY: None   CURRENT THERAPY: Alecensa (Alectinib) 600 mg p.o. twice daily.  First dose April 11, 2021.  Status post 2 months of treatment. Subjective: The patient was seen and examined today.  His son was at the bedside.  The patient is feeling fine today with no concerning complaints except for the persistent swelling of the left lower extremity.  He denied having any current chest pain, shortness of breath except with exertion with no cough or hemoptysis.  He has no nausea, vomiting, diarrhea or constipation.  He has no headache or visual changes.  He was admitted to the hospital yesterday for evaluation of the swelling and weakness in the left lower extremity.  MRI of the right femur showed large well-defined fluid collection within the posterior compartment of the mid left thigh consistent with a hematoma.  During his evaluation he had CT scan of the chest without contrast that showed concerning findings for disease progression reported as rapidly progressive pleural metastasis bilaterally right greater than the left.  I was consulted for evaluation of his condition.  Objective: Vital signs in last 24 hours: Temp:  [97.3 F (36.3 C)-98.2 F (36.8 C)] 97.3 F (36.3 C) (05/20 0824) Pulse Rate:  [86-100] 100 (05/20 0824) Resp:  [15-23] 20 (05/20 0824) BP: (128-162)/(53-88) 135/53 (05/20 0824) SpO2:  [93 %-100 %] 96 % (05/20 0824)  Intake/Output from previous day: 05/19 0701 - 05/20 0700 In: 194 [I.V.:194] Out: -  Intake/Output this shift: Total I/O In: -  Out: 100 [Urine:100]  General appearance: alert, cooperative, fatigued, and no distress Resp: clear to auscultation bilaterally Cardio: regular rate and rhythm, S1, S2 normal, no murmur, click, rub or gallop GI: soft, non-tender; bowel sounds normal; no masses,  no  organomegaly Extremities: edema 2+ edema left lower extremity  Lab Results:  Recent Labs    06/15/21 0400 06/15/21 0413 06/16/21 0141  WBC 9.5  --  6.9  HGB 9.6* 9.5* 8.2*  HCT 30.1* 28.0* 26.6*  PLT 318  --  274   BMET Recent Labs    06/15/21 2242 06/16/21 0141  NA 137 137  K 5.4* 5.4*  CL 112* 112*  CO2 16* 15*  GLUCOSE 99 72  BUN 48* 49*  CREATININE 5.19* 5.31*  CALCIUM 8.1* 8.3*    Studies/Results: CT Chest Wo Contrast  Result Date: 06/15/2021 CLINICAL DATA:  81 year old male with history of abnormal chest x-ray. Lung nodule. Shortness of breath. History of stage IV lung cancer. * Tracking Code: BO * EXAM: CT CHEST WITHOUT CONTRAST TECHNIQUE: Multidetector CT imaging of the chest was performed following the standard protocol without IV contrast. RADIATION DOSE REDUCTION: This exam was performed according to the departmental dose-optimization program which includes automated exposure control, adjustment of the mA and/or kV according to patient size and/or use of iterative reconstruction technique. COMPARISON:  Chest CT 06/11/2021.  Chest x-ray 06/15/2021. FINDINGS: Cardiovascular: Heart size is mildly enlarged. There is no significant pericardial fluid, thickening or pericardial calcification. There is aortic atherosclerosis, as well as atherosclerosis of the great vessels of the mediastinum and the coronary arteries, including calcified atherosclerotic plaque in the left main, left anterior descending, left circumflex and right coronary arteries. Calcifications of the aortic valve. Mediastinum/Nodes: No pathologically enlarged mediastinal or hilar lymph nodes. Esophagus is unremarkable in appearance. No axillary lymphadenopathy. Lungs/Pleura: Previously noted right lower lobe pulmonary nodule  appears slightly larger than the recent prior study, currently measuring 1.2 x 0.7 cm (axial image 69 of series 4). Increasing nodular and mass-like pleural thickening bilaterally (right  greater than left), with the largest pleural lesion on the right posteromedially (axial image 64 of series 3) measuring 2.9 x 1.6 cm (previously only 1.6 x 0.6 cm when measured in retrospect on prior examination 06/11/2021 on axial image 31 of series 2. Other right-sided pleural nodularity is also noted, most evident near the apex (axial image 23 of series 3) measuring 2.4 x 0.9 cm, new compared to the prior study. Less pronounced nodular pleural thickening is also noted in the left hemithorax is well. Trace volume of pleural fluid bilaterally. No confluent consolidative airspace disease. Mild diffuse bronchial wall thickening with very mild centrilobular and paraseptal emphysema. Upper Abdomen: Incompletely imaged exophytic low-attenuation lesion in the upper pole of the right kidney measuring at least 3.1 cm in diameter, previously characterized as a simple cyst. Aortic atherosclerosis. 1.7 x 1.2 cm nodule in the medial limb of the right adrenal gland. Musculoskeletal: Numerous sclerotic lesions are again noted throughout the visualized axial and appendicular skeleton, indicative of widespread metastatic disease to the bones. This appears grossly similar to the recent prior study. Multiple old healed left-sided rib fractures are incidentally noted. IMPRESSION: 1. Today's study demonstrates progression of disease, with slight interval enlargement of the previously noted right lower lobe pulmonary nodule, and what appears to be rapidly progressive pleural metastases bilaterally (right greater than left), as above. Diffuse osseous metastatic disease appears grossly unchanged. 2. Mild cardiomegaly. 3. Aortic atherosclerosis, in addition to left main and three-vessel coronary artery disease. 4. There are calcifications of the aortic valve. Echocardiographic correlation for evaluation of potential valvular dysfunction may be warranted if clinically indicated. 5. Mild diffuse bronchial wall thickening with very mild  centrilobular and paraseptal emphysema; imaging findings suggestive of underlying COPD. Aortic Atherosclerosis (ICD10-I70.0) and Emphysema (ICD10-J43.9). Electronically Signed   By: Vinnie Langton M.D.   On: 06/15/2021 05:43   MR LUMBAR SPINE WO CONTRAST  Result Date: 06/15/2021 CLINICAL DATA:  Left foot drop EXAM: MRI LUMBAR SPINE WITHOUT CONTRAST TECHNIQUE: Multiplanar, multisequence MR imaging of the lumbar spine was performed. No intravenous contrast was administered. COMPARISON:  None Available. FINDINGS: Degraded by motion artifact. Segmentation: Standard. Alignment:  No significant listhesis. Vertebrae: Mild degenerative plate irregularity. There are scattered small foci of low T1 and T2 signal corresponding to sclerotic metastases on prior imaging. Conus medullaris and cauda equina: Conus extends to the L1 level. Conus and cauda equina appear normal. Paraspinal and other soft tissues: Better evaluated on recent CT imaging. Disc levels: Congenital narrowing of the spinal canal. L1-L2: Disc bulge. Mild facet arthropathy. Moderate canal stenosis. Mild foraminal stenosis. L2-L3: Disc bulge. Mild facet arthropathy. No significant canal stenosis. Minor foraminal stenosis. L3-L4: Disc bulge. Facet arthropathy. Mild canal stenosis. Partial effacement of the subarticular recesses. Mild right and mild to moderate left foraminal stenosis. L4-L5: Disc bulge. Facet arthropathy with ligamentum flavum infolding. Marked canal stenosis. Effacement of the subarticular recesses. Moderate foraminal stenosis. L5-S1: Disc bulge. Facet arthropathy. No canal stenosis. Mild to moderate foraminal stenosis. IMPRESSION: Multilevel degenerative changes superimposed on congenital narrowing of the spinal canal. There is marked canal stenosis at L4-L5 with effacement of subarticular recesses and crowding of the cauda equina nerve roots. Foraminal narrowing is also greatest at L4-L5. Scattered foci of abnormal signal corresponding to  sclerotic metastases on prior imaging. The requested T11 level was inadvertently not included.  However, there is no evidence of extraosseous extension on the prior CT abdomen. Electronically Signed   By: Macy Mis M.D.   On: 06/15/2021 17:41   US RENAL  Result Date: 06/15/2021 CLINICAL DATA:  AKI EXAM: RENAL / URINARY TRACT ULTRASOUND COMPLETE COMPARISON:  CT abdomen/pelvis 06/11/2021 FINDINGS: Right Kidney: Renal measurements: 10.8 cm x 4.8 cm x 5.9 cm = volume: 161 mL. Multiple cysts are noted measuring up to 3.4 cm. No specific imaging follow-up is required. Parenchymal echogenicity is mildly increased. There is no hydronephrosis. Left Kidney: Renal measurements: 10.9 cm x 5.3 cm x 4.9 cm = volume: 147 mL. Parenchymal echogenicity is mildly increased. No mass or hydronephrosis visualized. Bladder: Decompressed and not well evaluated. Other: None. IMPRESSION: Mildly increased parenchymal echogenicity bilaterally suggestive of medical renal disease. No hydronephrosis. Electronically Signed   By: Valetta Mole M.D.   On: 06/15/2021 09:38   MR FEMUR LEFT WO CONTRAST  Result Date: 06/15/2021 CLINICAL DATA:  Soft tissue mass, thigh, superficial inferior gluteus maximus muscle in the upper thigh EXAM: MR OF THE LEFT FEMUR WITHOUT CONTRAST TECHNIQUE: Multiplanar, multisequence MR imaging of the left thigh was performed. No intravenous contrast was administered. COMPARISON:  CT 06/13/2021 FINDINGS: Bones/Joint/Cartilage No acute fracture. No dislocation. No femoral head avascular necrosis. No bone marrow edema. No marrow replacing bone lesion. Mild degenerative changes of the left hip. No left hip joint effusion. Moderate degenerative changes of the left knee. Posterior horn of the medial meniscus of the left knee is torn (series 18, images 9-10). Small-moderate-sized Baker's cyst. Ligaments Not well assessed at the edges of the field of view. No acute injury is evident. Muscles and Tendons Large elongated  well-defined complex fluid collection within the posterior compartment of the mid left thigh. Collection measures 23.0 x 4.9 x 6.5 cm. Collection demonstrates intermediate to high T1 signal and is heterogeneously hyperintense on T2 weighted imaging. Serpiginous band-like areas of relatively decreased T2 signal intensity within the collection. No definite solid component. Mild intramuscular edema within the adjacent posterior compartment musculature of the left thigh, likely reactive. Generalized muscle atrophy. No acute tendinous abnormality. Left hamstring tendons intact. Incidentally noted lipoma superficial to the proximal left rectus femoris muscle measuring 7.5 x 2.5 x 4.2 cm (series 4, image 26). No internal complexity. Soft tissues There is a large right inguinal hernia containing bowel. Generalized soft tissue edema. No additional fluid collections. No left inguinal lymphadenopathy. IMPRESSION: 1. Large well-defined fluid collection within the posterior compartment of the mid left thigh measuring 23.0 x 4.9 x 6.5 cm. Appearance is most consistent with a hematoma. 2. No acute osseous abnormality.  No suspicious bone lesion. 3. Large right inguinal hernia containing bowel. 4. Posterior horn of the medial meniscus of the left knee is torn. 5. Small-moderate-sized Baker's cyst. Electronically Signed   By: Davina Poke D.O.   On: 06/15/2021 17:37   DG Chest Portable 1 View  Result Date: 06/15/2021 CLINICAL DATA:  Shortness of breath EXAM: PORTABLE CHEST 1 VIEW COMPARISON:  CT from 06/11/2021 FINDINGS: Cardiac shadow is within normal limits. Lungs are well aerated bilaterally. No focal infiltrate or sizable effusion is noted. Old healed rib fractures on the left are noted. No acute bony abnormality is seen. IMPRESSION: No acute abnormality noted. Electronically Signed   By: Inez Catalina M.D.   On: 06/15/2021 04:04    Medications: I have reviewed the patient's current medications.  CODE STATUS: Full  code  Assessment/Plan: This is a very pleasant 80 years  old [place] male diagnosed with a stage IV non-small cell lung cancer, adenocarcinoma with positive ALK gene translocation in February 2023 status post treatment with Alecensa (Alectinib) 600 mg p.o. twice daily since April 11, 2021 and the patient missed around 2 weeks of treatment during this interval due to pulmonary embolism and deep venous thrombosis. His most recent CT scan of the chest, abdomen and pelvis performed on 06/11/2021 showed significant improvement of his disease. Repeat CT scan of the chest yesterday reported rapidly disease progression with pleural-based metastasis.  Definitely this is a possibility but unlikely to have significant disease progression of lung cancer within 4 days on very effective treatment.  I have some concern about the findings that were reported on the CT scan of the chest which was done without contrast. This could be disease progression but again unlikely and other explanation could be inflammatory process or fluid collection including hematoma. I had a lengthy discussion with the patient and his son about his condition. I recommended for them to resume his treatment with Alecensa (Alectinib) with the same dose 600 mg p.o. twice daily.  His medications are available at the pharmacy and he should resume it today. After discharge I would consider repeating a PET scan for further evaluation of this pleural-based lesions before making any changes to his active treatment. For the deep venous thrombosis and history of pulmonary embolism, the patient will continue with the heparin drip for now and he is managed by the primary care team with the help of pharmacy. Thank you for taking good care of Mr. Higbie, I will continue to follow-up the patient with you and assist in his management on as-needed basis.  Please call if you have any questions.   LOS: 1 day    Eilleen Kempf 06/16/2021  ADDENDUM: Correction:  For the deep venous thrombosis and history of pulmonary embolism, the patient has IVC filter placed and he is currently on treatment with Plavix.  He is not on heparin drip as mentioned in the note above.

## 2021-06-16 NOTE — Consult Note (Signed)
Renal Service Consult Note [facility]  Norris MAYRA BRAHM 06/16/2021 Sol Blazing, MD Requesting Physician: Dr. Clementeen Graham  Reason for Consult: Renal failure HPI: The patient is a 81 y.o. year-old w/ hx of HL, HTN, lung cancer stage IV, DM2, CAD sp PCI, gout who presented w/ SOB. Has lung cancer and is taking po chemo. Also has DVT and is currently being treated for this. In ED creat was up > 5.  Pt had rec'd IV contrast w/ CT abd / pelvis/ chest 4 days off on 5/15.  Pt was admitted. We are asked to see for renal failure.   Pt seen in room w/ dtr who is a Marine scientist.  Dtr interprets and gives most of the history as well. Pt is alert and pleasant. Dtr states he was admitted jan 2023 w/ progressive SOB and was found to have DVT/ PE and lung nodules that were cancerous. In feb had a PET scan and had a stroke. In April he had a bleed into his left leg (on eliquis for PE) so eliquis was dc'd and IVC filter was placed.  He retired 1 yr ago.   He denies any voiding issues, change in urine color,e tc. No SOB, does have LLE edema due to recent DVT   ROS - denies CP, no joint pain, no HA, no blurry vision, no rash, no diarrhea, no nausea/ vomiting   Past Medical History  Past Medical History:  Diagnosis Date   Diabetes mellitus without complication ([place])    High cholesterol    Hypertension    lung ca    Normal nuclear stress test 01/29/2008   stress perfusion study apparently in 2010 in [facility] which he said was negative.   Past Surgical History  Past Surgical History:  Procedure Laterality Date   CATARACT EXTRACTION, BILATERAL  2006   CORONARY STENT INTERVENTION N/A 02/06/2021   Procedure: CORONARY STENT INTERVENTION;  Surgeon: Jettie Booze, MD;  Location: [place] [facility];  Service: Cardiovascular;  Laterality: N/A;   FINE NEEDLE ASPIRATION  03/21/2021   Procedure: FINE NEEDLE ASPIRATION;  Surgeon: Garner Nash, DO;  Location: [place];  Service:  Pulmonary;;   INTRAVASCULAR ULTRASOUND/IVUS N/A 02/06/2021   Procedure: Intravascular Ultrasound/IVUS;  Surgeon: Jettie Booze, MD;  Location: [place] [facility];  Service: Cardiovascular;  Laterality: N/A;   KNEE SURGERY  2005   LEFT HEART CATH AND CORONARY ANGIOGRAPHY N/A 02/06/2021   Procedure: LEFT HEART CATH AND CORONARY ANGIOGRAPHY;  Surgeon: Jettie Booze, MD;  Location: [place] [facility];  Service: Cardiovascular;  Laterality: N/A;   VIDEO BRONCHOSCOPY WITH ENDOBRONCHIAL ULTRASOUND Bilateral 03/21/2021   Procedure: VIDEO BRONCHOSCOPY WITH ENDOBRONCHIAL ULTRASOUND;  Surgeon: Garner Nash, DO;  Location: [place];  Service: Pulmonary;  Laterality: Bilateral;   Family History  Family History  Problem Relation Age of Onset   Coronary artery disease Father    Coronary artery disease Mother    Social History  reports that he has never smoked. He has never used smokeless tobacco. He reports that he does not drink alcohol and does not use drugs. Allergies  Allergies  Allergen Reactions   Ace Inhibitors     REACTION: Cough   Home medications Prior to Admission medications   Medication Sig Start Date End Date Taking? Authorizing Provider  acetaminophen (TYLENOL) 650 MG CR tablet Take 650 mg by mouth every 8 (eight) hours as needed for pain.   Yes [provider]  alectinib (  ALECENSA) 150 MG capsule Take 4 capsules (600 mg total) by mouth 2 (two) times daily with a meal. 04/09/21  Yes Si Gaul, MD  allopurinol (ZYLOPRIM) 100 MG tablet Take 1 tablet (100 mg total) by mouth daily. 03/13/21  Yes Carney Living, MD  clopidogrel (PLAVIX) 75 MG tablet Take 1 tablet (75 mg total) by mouth daily. 04/04/21  Yes Chambliss, Estill Batten, MD  colchicine 0.6 MG tablet Take 1/2 tablet (0.3 mg total) by mouth daily. 03/22/21  Yes Alfredo Martinez, MD  diclofenac Sodium (VOLTAREN) 1 % GEL Apply 2 g topically 4 (four) times daily. Patient taking differently: Apply 2-4 g  topically 4 (four) times daily as needed (knees/legs). 01/31/21  Yes Chambliss, Estill Batten, MD  empagliflozin (JARDIANCE) 10 MG TABS tablet Take 1 tablet (10 mg total) by mouth daily. 04/04/21  Yes Chambliss, Estill Batten, MD  ferrous sulfate 324 (65 Fe) MG TBEC Take 324 mg by mouth daily. 08/17/20  Yes Carney Living, MD  HYDROcodone-acetaminophen (NORCO/VICODIN) 5-325 MG tablet Take 1 tablet by mouth every 6 (six) hours as needed for moderate pain. 05/07/21  Yes [provider]  losartan (COZAAR) 25 MG tablet Take 1 tablet (25 mg total) by mouth daily. 04/04/21  Yes Carney Living, MD  metFORMIN (GLUCOPHAGE) 1000 MG tablet Take 1 tablet (1,000 mg total) by mouth 2 (two) times daily with a meal. 01/31/21  Yes Chambliss, Estill Batten, MD  polyethylene glycol powder (GLYCOLAX/MIRALAX) 17 GM/SCOOP powder Take 17 g by mouth daily. 04/23/21  Yes Sowell, Apolinar Junes, MD  pregabalin (LYRICA) 50 MG capsule Take 50 mg by mouth 2 (two) times daily as needed (pain). As needed 05/07/21  Yes [provider]  rosuvastatin (CRESTOR) 40 MG tablet Take 1 tablet (40 mg total) by mouth daily. 04/04/21  Yes Carney Living, MD  trolamine salicylate (ASPER-FLEX) 10 % cream Apply 1 application topically as needed for muscle pain. 04/08/18  Yes Carney Living, MD     Vitals:   06/16/21 8779 06/16/21 1029 06/16/21 1144 06/16/21 1329  BP: (!) 135/53   (!) 152/73  Pulse: 100   89  Resp: 20   20  Temp: (!) 97.3 F (36.3 C)   99.1 F (37.3 C)  TempSrc: Oral   Oral  SpO2: 96% 95% 99% 96%  Weight:      Height:       Exam Gen alert, no distress No rash, cyanosis or gangrene Sclera anicteric, throat clear  No jvd or bruits Chest clear bilat to bases, no rales/ wheezing RRR no RG Abd soft ntnd no mass or ascites +bs GU normal male MS no joint effusions or deformity Ext 1-2+ LLE edema, no other edema, no wounds or ulcers Neuro is alert, Ox 3 , nf   Home meds include - tylenol, alectinib,  zyloprim, plavix, colchicine, voltaren gel, empagliflozin 10, ferr sulfate, norco, losartan 25, miralax, lyrica 50 bid, crestor, asper-flex cream     Date   Creat  eGFR     2012- 2015  1.11- 1.32      2017- July 2022 1.14- 1.40 46- 60 ml/min        Jan 2023  1.17-  1.52 46- >60      Feb 2023  1.24- 1.56 45- 59 ml/min, stage 3a      March 2023            1.15 May 2021     1.50  Jun 11, 2021 1.83     06/15/21  4.73  12     06/16/21  5.31  10 ml/min    UA 5/19 - 100 prot, rare bact, 0-5 rbc, 6-10 wbc, 0-5 epis    UNa 27, UCr 70      CXR 5/19- FINDINGS: Cardiac shadow is within normal limits. Lungs are well aerated bilaterally. No focal infiltrate or sizable effusion is noted. Old healed rib fractures on the left are noted. No acute bony abnormality is seen. IMPRESSION: No acute abnormality noted.     Renal US > 10-11 cm kidneys w/o hydro   Assessment/ Plan: AKI on CKD 3a - b/l creat is 1.2- 1.5 from early 2023, eGFR 45- 60 ml/min. Here creat is 4.7 several days after IV contrast exposure for CT scan on 5/15. No other nephrotoxic medications, no hypotension, no acei/ ARB / nsaids. UA unremarkable, renal US w/o obstruction. Urine lytes are c/w ATN. Suspect ATN from contrast injury. Rx is supportive care. Will cont IVF's but change to bicarb gtt given low serum CO2. Will follow.  Metabolic acidosis - due to severe AKI, have changed IVF's to bicarb gtt Hyperkalemia - have changed diet to renal (+vegetarian) as he needs a low K+ diet right now. Added 10 gm tid lokelma as well.  Lung cancer - taking po chemo Volume - looks euvolemic. Only edema is focal LLE related to recent DVT      Kelly Splinter  MD 06/16/2021, 2:23 PM Recent Labs  Lab 06/15/21 0413 06/15/21 9450 06/15/21 1822 06/15/21 2242 06/16/21 0141  HGB 9.5*  --   --   --  8.2*  ALBUMIN  --  2.5*  --   --  2.2*  CALCIUM  --  8.1*   < > 8.1* 8.3*  PHOS  --  5.7*  --   --  6.7*  CREATININE 5.00* 4.65*   < > 5.19* 5.31*   K 5.1 5.5*   < > 5.4* 5.4*   < > = values in this interval not displayed.

## 2021-06-16 NOTE — Progress Notes (Signed)
OT Cancellation Note  Patient Details Name: Arthur [name] MRN: [id number] DOB: May 23, 1940   Cancelled Treatment:        06/16/21 1037  OT Visit Information  Last OT Received On 06/16/21  Reason Eval/Treat Not Completed Patient at procedure or test/ unavailable (working with PT)     [name] 06/16/2021, 10:37 [place] Pager: [phone] Office: [phone]

## 2021-06-16 NOTE — Progress Notes (Signed)
FPTS Interim Night Progress Note  S:Patient alert and resting in bed.  Reports some cramping abdominal pain.  Reports has not had BM in 3 days.  Rounded with primary night RN.  No concerns voiced.  No orders required.    O: Today's Vitals   06/16/21 1144 06/16/21 1329 06/16/21 1548 06/16/21 1932  BP:  (!) 152/73  132/62  Pulse:  89  90  Resp:  20  20  Temp:  99.1 F (37.3 C)  98.1 F (36.7 C)  TempSrc:  Oral  Oral  SpO2: 99% 96% 100% 97%  Weight:      Height:      PainSc:       Physical Exam:  General: 81 y.o. male in NAD Cardio: RRR no m/r/g Lungs: CTAB, no wheezing, no rhonchi, no crackles, no IWOB on room air Abdomen: Soft, non-tender to palpation, non-distended, positive bowel sounds.  Skin: warm and dry Extremities: No edema    A/P: Continue current management Miralax daily  Carollee Leitz MD PGY-3, [place] [facility] pager [phone]

## 2021-06-16 NOTE — Progress Notes (Addendum)
Results of CBC and BMP inconsistent with expected values. Dr Adah Salvage notified. Labs reordered for redraw.

## 2021-06-16 NOTE — Plan of Care (Signed)
  Problem: Education: Goal: Knowledge of General Education information will improve Description: Including pain rating scale, medication(s)/side effects and non-pharmacologic comfort measures Outcome: Progressing   Problem: Clinical Measurements: Goal: Ability to maintain clinical measurements within normal limits will improve Outcome: Progressing   Problem: Clinical Measurements: Goal: Diagnostic test results will improve Outcome: Progressing   Problem: Clinical Measurements: Goal: Respiratory complications will improve Outcome: Progressing   Problem: Clinical Measurements: Goal: Cardiovascular complication will be avoided Outcome: Progressing   Problem: Nutrition: Goal: Adequate nutrition will be maintained Outcome: Progressing   Problem: Activity: Goal: Risk for activity intolerance will decrease Outcome: Progressing   Problem: Elimination: Goal: Will not experience complications related to bowel motility Outcome: Progressing   Problem: Pain Managment: Goal: General experience of comfort will improve Outcome: Progressing   Problem: Safety: Goal: Ability to remain free from injury will improve Outcome: Progressing

## 2021-06-16 NOTE — Progress Notes (Addendum)
FMTS Attending Daily Note: Dorris Singh, MD  Team Pager [phone] Pager [phone]  I have seen and examined this patient, reviewed their chart. I have discussed this patient with the resident physician.  I have edited the plan below  Disposition--ongoing inpatient management, anticipate 3-4 more days in hospital for IV fluids, evaluation of anemia    Family Medicine Teaching Service Daily Progress Note Intern Pager: [phone]  Patient name: Arthur [name] Medical record number: [id number] Date of birth: 05-10-1940 Age: 81 y.o. Gender: male  Primary Care Provider: Lind Covert, MD Consultants: Medical Oncology, Nephrology  Code Status: Full  Pt Overview and Major Events to Date:  Arthur [name] is an 81 year old male presenting with dyspnea likely multifactorial, due to progression of NSCLC. History significant for CAD s/p stent (January),  pulmonary embolism  (January), DVT on apixaban->enoxaparin, LLE hematoma requiring transfusion and IVC filter placement (April), and NSCLC (Stage IV) on alectinib   5/19: Admitted with dyspnea, found to have profound AKI 5/20: Hemoglobin downtrending, dyspnea improved entirely  Assessment and Plan:  Dyspnea likely due to progression of NSCLC in setting of worsening anemia, also considered PE but less likely given IVC filter, marked improvement, no hypoxemia  Patient reports improvement with his dyspnea today.  Was seen by med onc who recommend restarting patient on Alecensa 600 mg twice daily since he made progress in the past on same medication.  Med Onc also recommend considering repeat of PET scan for further evaluation outpatient. -Medical oncology following, appreciate recs -Start Alecensa 600 mg 3 times daily  Acute renal failure in setting CKD IIIA  hyperkalemia  Hyperphosphatemia  Oliguric,FeNa 1.4%, likely from contrast consistent with ATN.   -Nephrology following, appreciate recs -Continue MIVF at 167ml/hour +  bicarbonate  -PM potassium   Anemia, likely due to recent blood loss (thigh hematoma while on enoxaparin) and chronic disease.  - Transfusion threshold 8 - CBC, iron studies in PM - Monitor for bleeding - Consider vascular surgery consult if continue to see fall in hemoglobin - Monitor for GI blood loss  CAD s/p stent - Rosuvastatin dose reduced - Continue clopidogrel   History of recent stroke, thought to be cardioembolic - Not on anticoagulation due to above anemia, recent significant bleeding event  - Clopidogrel as above - Crestor dose reduced   Left foot drop On exam patient is still unable to dorsiflex or extended his left foot.  Does endorse paresthesia with decreased sensation of the foot. Lumbar MRI showed narrowing of the L4-L5 nerve roots and femur head MRI show hematoma in the mid left thigh. Given absence of pain,most  likely superficial peroneal nerve (sensation loss matches this lesion type)  He was seen this morning by physical therapy who recommend home health PT and LLE AFO -Continue PT/OT -Fall precaution  -Up with assistance -Home health PT when ready for d/c  Hx of DVT and PE Patient with hx of DVT and PE. He is s/p IVC placement after history of bleeding on levonox and Eliquis. Will avoid anticoagulant at this time given significant hematoma, worsening anemia. IVC filter to prevent PE. High likelihood still has significant residual DVT in bilateral lower extremities (did not complete therapy given anemia and hematoma requiring tranfusion) Will place him on SCDs for DVT prophylaxis.    FEN/GI: Clear diet PPx: SCDs Dispo:Home with home health  pending clinical improvement . Barriers include clinical status.   Subjective:  Nausea said he is doing well this morning and feels like his symptoms  overall improved.  His dyspnea is much improved and still endorses difficulty with his left foot  Objective: Temp:  [97.3 F (36.3 C)-98.2 F (36.8 C)] 97.3 F (36.3 C)  (05/20 0824) Pulse Rate:  [86-100] 100 (05/20 0824) Resp:  [15-23] 20 (05/20 0824) BP: (128-162)/(53-88) 135/53 (05/20 0824) SpO2:  [93 %-100 %] 95 % (05/20 1029) Physical Exam: General: Alert, well appearing, NAD HEENT: Atraumatic, MMM, No sclera icterus CV: RRR, no murmurs, normal S1/S2 Pulm: CTAB, good WOB on RA, no crackles or wheezing Abd: Soft, no distension, no tenderness Skin: dry, warm Ext: No BLE edema,diminished sensation on Left foot with difficulty on dorsiflexion and plantarflexion. RLE is normal.  Normal finger to nose (mild ataxia on L due to pulse ox) CN II-XII tested, + visual field cut (similar to February)   Laboratory: Recent Labs  Lab 06/11/21 1350 06/15/21 0400 06/15/21 0413 06/16/21 0141  WBC 9.2 9.5  --  6.9  HGB 9.8* 9.6* 9.5* 8.2*  HCT 29.9* 30.1* 28.0* 26.6*  PLT 291 318  --  274   Recent Labs  Lab 06/11/21 1350 06/15/21 0400 06/15/21 0413 06/15/21 1822 06/15/21 2242 06/16/21 0141  NA 137 136   < > 137 137 137  K 4.3 5.2*   < > 5.9* 5.4* 5.4*  CL 108 109   < > 111 112* 112*  CO2 23 16*   < > 17* 16* 15*  BUN 22 43*   < > 44* 48* 49*  CREATININE 1.83* 4.73*   < > 4.93* 5.19* 5.31*  CALCIUM 8.5* 8.5*   < > 8.4* 8.1* 8.3*  PROT 6.6 6.3*  --   --   --   --   BILITOT 1.4* 1.4*  --   --   --   --   ALKPHOS 90 86  --   --   --   --   ALT 11 13  --   --   --   --   AST 21 21  --   --   --   --   GLUCOSE 99 89   < > 100* 99 72   < > = values in this interval not displayed.    Imaging/Diagnostic Tests: MR LUMBAR SPINE WO CONTRAST  Result Date: 06/15/2021 CLINICAL DATA:  Left foot drop EXAM: MRI LUMBAR SPINE WITHOUT CONTRAST TECHNIQUE: Multiplanar, multisequence MR imaging of the lumbar spine was performed. No intravenous contrast was administered. COMPARISON:  None Available. FINDINGS: Degraded by motion artifact. Segmentation: Standard. Alignment:  No significant listhesis. Vertebrae: Mild degenerative plate irregularity. There are scattered  small foci of low T1 and T2 signal corresponding to sclerotic metastases on prior imaging. Conus medullaris and cauda equina: Conus extends to the L1 level. Conus and cauda equina appear normal. Paraspinal and other soft tissues: Better evaluated on recent CT imaging. Disc levels: Congenital narrowing of the spinal canal. L1-L2: Disc bulge. Mild facet arthropathy. Moderate canal stenosis. Mild foraminal stenosis. L2-L3: Disc bulge. Mild facet arthropathy. No significant canal stenosis. Minor foraminal stenosis. L3-L4: Disc bulge. Facet arthropathy. Mild canal stenosis. Partial effacement of the subarticular recesses. Mild right and mild to moderate left foraminal stenosis. L4-L5: Disc bulge. Facet arthropathy with ligamentum flavum infolding. Marked canal stenosis. Effacement of the subarticular recesses. Moderate foraminal stenosis. L5-S1: Disc bulge. Facet arthropathy. No canal stenosis. Mild to moderate foraminal stenosis. IMPRESSION: Multilevel degenerative changes superimposed on congenital narrowing of the spinal canal. There is marked canal stenosis at L4-L5 with effacement of  subarticular recesses and crowding of the cauda equina nerve roots. Foraminal narrowing is also greatest at L4-L5. Scattered foci of abnormal signal corresponding to sclerotic metastases on prior imaging. The requested T11 level was inadvertently not included. However, there is no evidence of extraosseous extension on the prior CT abdomen. Electronically Signed   By: Macy Mis M.D.   On: 06/15/2021 17:41   MR FEMUR LEFT WO CONTRAST  Result Date: 06/15/2021 CLINICAL DATA:  Soft tissue mass, thigh, superficial inferior gluteus maximus muscle in the upper thigh EXAM: MR OF THE LEFT FEMUR WITHOUT CONTRAST TECHNIQUE: Multiplanar, multisequence MR imaging of the left thigh was performed. No intravenous contrast was administered. COMPARISON:  CT 06/13/2021 FINDINGS: Bones/Joint/Cartilage No acute fracture. No dislocation. No femoral  head avascular necrosis. No bone marrow edema. No marrow replacing bone lesion. Mild degenerative changes of the left hip. No left hip joint effusion. Moderate degenerative changes of the left knee. Posterior horn of the medial meniscus of the left knee is torn (series 18, images 9-10). Small-moderate-sized Baker's cyst. Ligaments Not well assessed at the edges of the field of view. No acute injury is evident. Muscles and Tendons Large elongated well-defined complex fluid collection within the posterior compartment of the mid left thigh. Collection measures 23.0 x 4.9 x 6.5 cm. Collection demonstrates intermediate to high T1 signal and is heterogeneously hyperintense on T2 weighted imaging. Serpiginous band-like areas of relatively decreased T2 signal intensity within the collection. No definite solid component. Mild intramuscular edema within the adjacent posterior compartment musculature of the left thigh, likely reactive. Generalized muscle atrophy. No acute tendinous abnormality. Left hamstring tendons intact. Incidentally noted lipoma superficial to the proximal left rectus femoris muscle measuring 7.5 x 2.5 x 4.2 cm (series 4, image 26). No internal complexity. Soft tissues There is a large right inguinal hernia containing bowel. Generalized soft tissue edema. No additional fluid collections. No left inguinal lymphadenopathy. IMPRESSION: 1. Large well-defined fluid collection within the posterior compartment of the mid left thigh measuring 23.0 x 4.9 x 6.5 cm. Appearance is most consistent with a hematoma. 2. No acute osseous abnormality.  No suspicious bone lesion. 3. Large right inguinal hernia containing bowel. 4. Posterior horn of the medial meniscus of the left knee is torn. 5. Small-moderate-sized Baker's cyst. Electronically Signed   By: Davina Poke D.O.   On: 06/15/2021 17:37     Alen Bleacher, MD 06/16/2021, 10:59 AM PGY-1, [place] Intern pager: [phone], text pages  welcome

## 2021-06-17 ENCOUNTER — Inpatient Hospital Stay (HOSPITAL_COMMUNITY): Payer: Medicare Other

## 2021-06-17 DIAGNOSIS — N179 Acute kidney failure, unspecified: Secondary | ICD-10-CM | POA: Diagnosis not present

## 2021-06-17 LAB — CBC WITH DIFFERENTIAL/PLATELET
Abs Immature Granulocytes: 0.03 10*3/uL (ref 0.00–0.07)
Basophils Absolute: 0.1 10*3/uL (ref 0.0–0.1)
Basophils Relative: 1 %
Eosinophils Absolute: 0.1 10*3/uL (ref 0.0–0.5)
Eosinophils Relative: 1 %
HCT: 27.1 % — ABNORMAL LOW (ref 39.0–52.0)
Hemoglobin: 9 g/dL — ABNORMAL LOW (ref 13.0–17.0)
Immature Granulocytes: 0 %
Lymphocytes Relative: 18 %
Lymphs Abs: 1.4 10*3/uL (ref 0.7–4.0)
MCH: 29.4 pg (ref 26.0–34.0)
MCHC: 33.2 g/dL (ref 30.0–36.0)
MCV: 88.6 fL (ref 80.0–100.0)
Monocytes Absolute: 0.8 10*3/uL (ref 0.1–1.0)
Monocytes Relative: 11 %
Neutro Abs: 5.3 10*3/uL (ref 1.7–7.7)
Neutrophils Relative %: 69 %
Platelets: 274 10*3/uL (ref 150–400)
RBC: 3.06 MIL/uL — ABNORMAL LOW (ref 4.22–5.81)
RDW: 17.2 % — ABNORMAL HIGH (ref 11.5–15.5)
WBC: 7.7 10*3/uL (ref 4.0–10.5)
nRBC: 0 % (ref 0.0–0.2)

## 2021-06-17 LAB — BASIC METABOLIC PANEL
Anion gap: 13 (ref 5–15)
BUN: 56 mg/dL — ABNORMAL HIGH (ref 8–23)
CO2: 19 mmol/L — ABNORMAL LOW (ref 22–32)
Calcium: 8 mg/dL — ABNORMAL LOW (ref 8.9–10.3)
Chloride: 102 mmol/L (ref 98–111)
Creatinine, Ser: 6.74 mg/dL — ABNORMAL HIGH (ref 0.61–1.24)
GFR, Estimated: 8 mL/min — ABNORMAL LOW (ref >60)
Glucose, Bld: 120 mg/dL — ABNORMAL HIGH (ref 70–99)
Potassium: 5.1 mmol/L (ref 3.5–5.1)
Sodium: 134 mmol/L — ABNORMAL LOW (ref 135–145)

## 2021-06-17 LAB — RENAL FUNCTION PANEL
Albumin: 2.4 g/dL — ABNORMAL LOW (ref 3.5–5.0)
Anion gap: 10 (ref 5–15)
BUN: 53 mg/dL — ABNORMAL HIGH (ref 8–23)
CO2: 18 mmol/L — ABNORMAL LOW (ref 22–32)
Calcium: 8 mg/dL — ABNORMAL LOW (ref 8.9–10.3)
Chloride: 107 mmol/L (ref 98–111)
Creatinine, Ser: 6.39 mg/dL — ABNORMAL HIGH (ref 0.61–1.24)
GFR, Estimated: 8 mL/min — ABNORMAL LOW (ref >60)
Glucose, Bld: 99 mg/dL (ref 70–99)
Phosphorus: 7.2 mg/dL — ABNORMAL HIGH (ref 2.5–4.6)
Potassium: 4.8 mmol/L (ref 3.5–5.1)
Sodium: 135 mmol/L (ref 135–145)

## 2021-06-17 LAB — LACTATE DEHYDROGENASE: LDH: 220 U/L — ABNORMAL HIGH (ref 98–192)

## 2021-06-17 LAB — GLUCOSE, CAPILLARY: Glucose-Capillary: 118 mg/dL — ABNORMAL HIGH (ref 70–99)

## 2021-06-17 IMAGING — CR DG CHEST 2V
2 series · 2 of 2 positions shown · non-contrast
Comparison: [DATE] chest x-ray

CLINICAL DATA: Shortness of breath

EXAM:
CHEST - 2 VIEW

[chest lat]
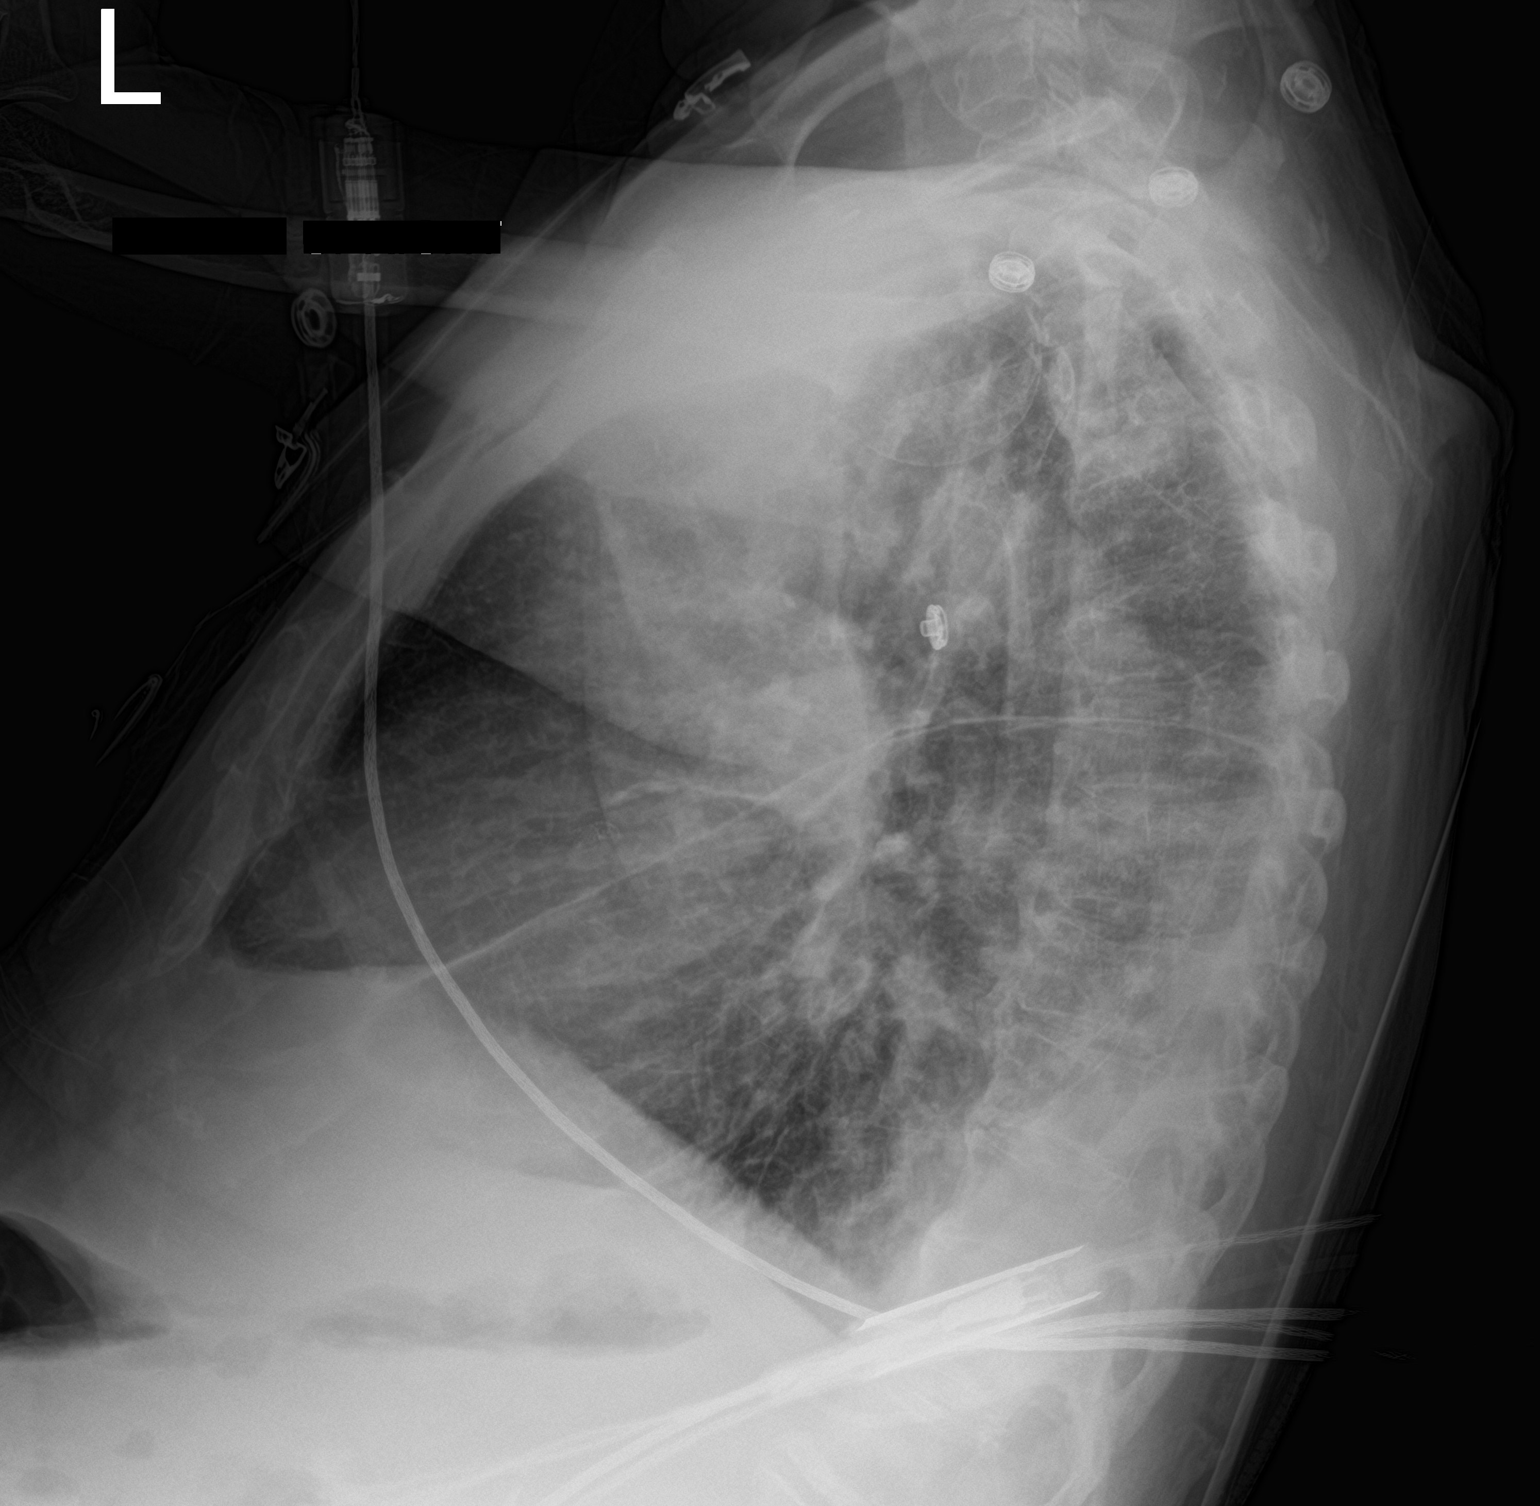

[chest ap]
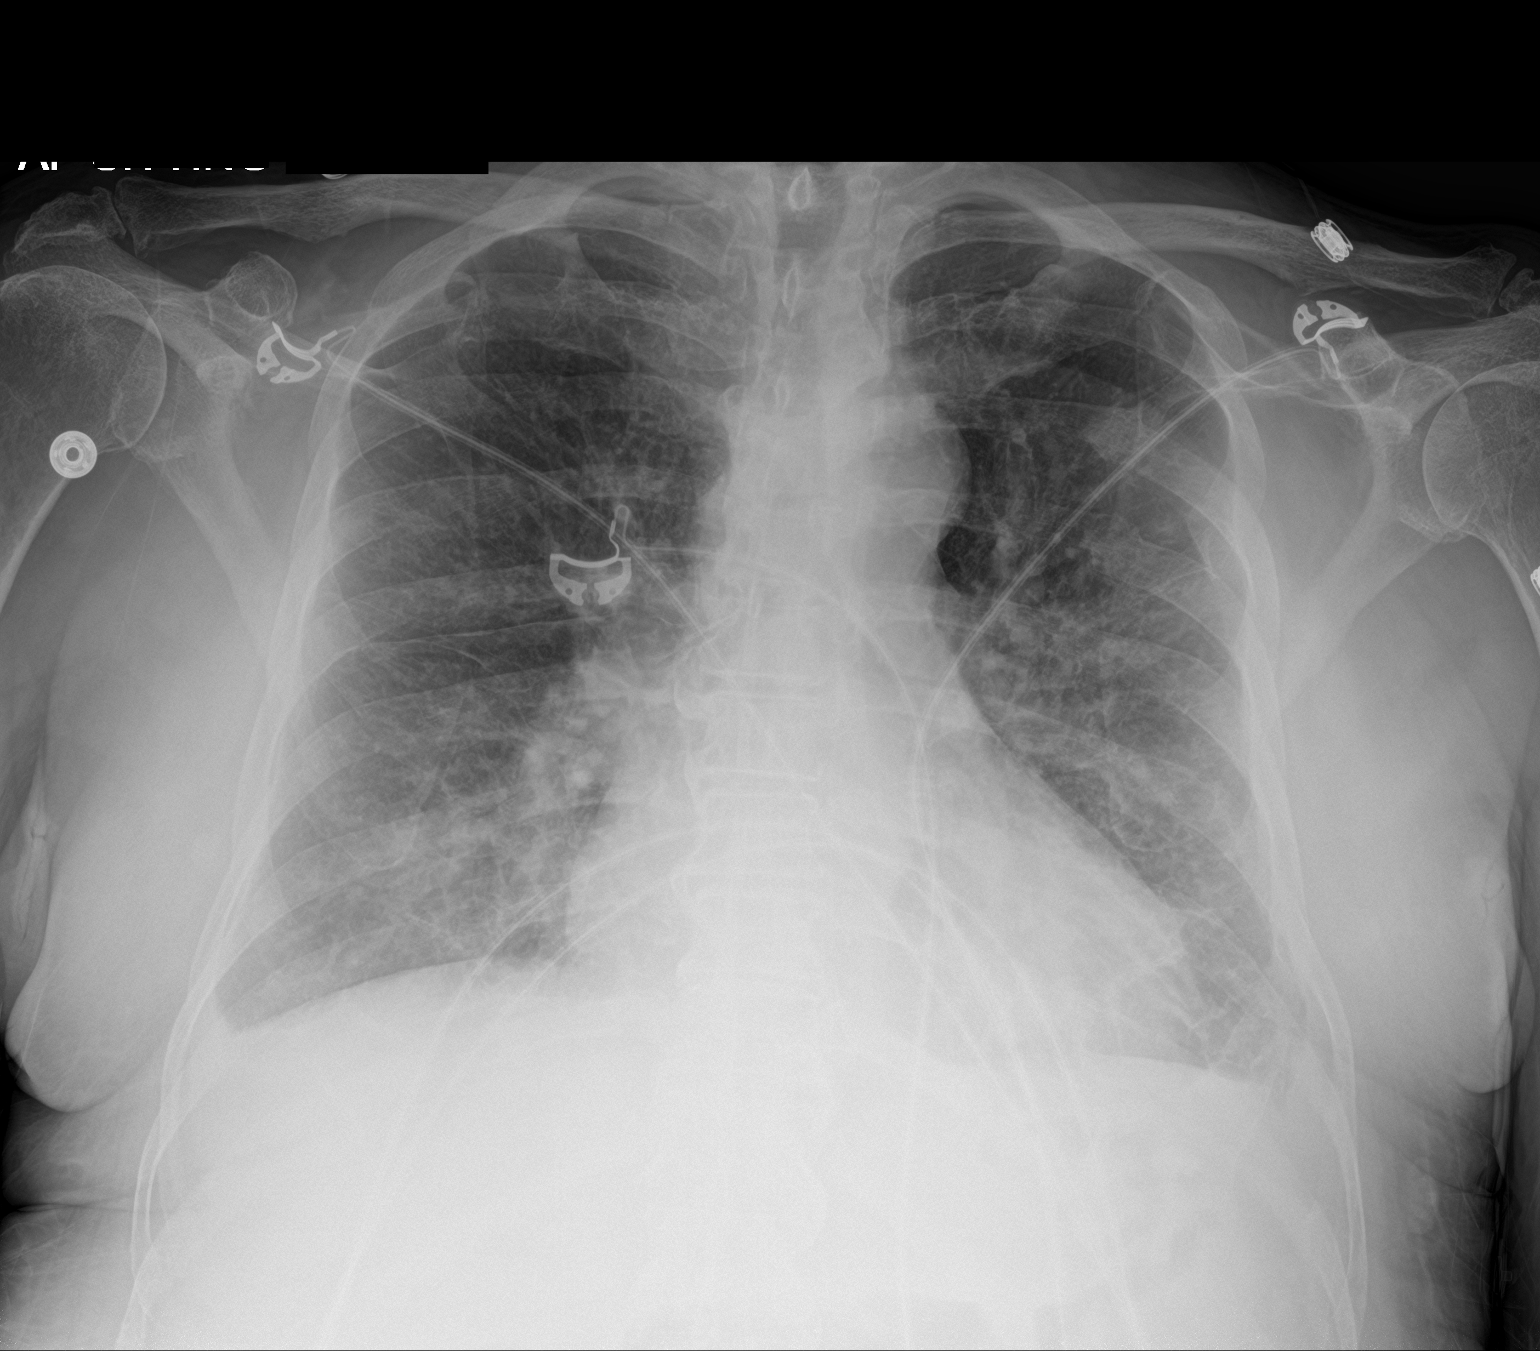

[2 of 2 positions shown; findings below may reference images not displayed]

FINDINGS: Cardiomegaly. The hila and mediastinum are unchanged. No
pneumothorax. Increasing interstitial opacities in the lungs, a
little more patchy in the bases. Small pleural effusions best
appreciated on the lateral view. No other interval changes.
IMPRESSION: Cardiomegaly, increasing interstitial opacities, and small pleural
effusions are suggestive of pulmonary edema. Recommend clinical
correlation.

## 2021-06-17 MED ORDER — FERROUS SULFATE 325 (65 FE) MG PO TABS
325.0000 mg | ORAL_TABLET | Freq: Every day | ORAL | Status: DC
Start: 2021-06-18 — End: 2021-06-25
  Administered 2021-06-18 – 2021-06-25 (×8): 325 mg via ORAL
  Filled 2021-06-17 (×9): qty 1

## 2021-06-17 MED ORDER — FUROSEMIDE 10 MG/ML IJ SOLN
100.0000 mg | Freq: Three times a day (TID) | INTRAVENOUS | Status: DC
  Administered 2021-06-17 – 2021-06-18 (×3): 100 mg via INTRAVENOUS
  Filled 2021-06-17 (×6): qty 10

## 2021-06-17 MED ORDER — FUROSEMIDE 10 MG/ML IJ SOLN
160.0000 mg | Freq: Once | INTRAVENOUS | Status: AC
  Administered 2021-06-17: 160 mg via INTRAVENOUS
  Filled 2021-06-17: qty 10

## 2021-06-17 NOTE — Progress Notes (Signed)
Pt has had 4 loose BM and has Voided in the toilet with each of these. Unable to keep accurate count of output in mls. Encouraged to void in urinal. Verbalized understanding.

## 2021-06-17 NOTE — Progress Notes (Addendum)
Family Medicine Teaching Service Daily Progress Note Intern Pager: [phone]  Patient name: Arthur [name] Medical record number: [id number] Date of birth: [birth year] Age: 81 y.o. Gender: male  Primary Care Provider: Lind Covert, MD Consultants: Medical oncology, nephrology Code Status: Full  Pt Overview and Major Events to Date:  5/19: Admitted with dyspnea, found to have profound AKI 5/20: Hemoglobin downtrending, dyspnea improved entirely  Assessment and Plan:  [name] is an 81 year old male presenting with dyspnea likely multifactorial, due to progression of NSCLC. History significant for CAD s/p stent (January),  pulmonary embolism  (January), DVT on apixaban->enoxaparin, LLE hematoma requiring transfusion and IVC filter placement (April), and NSCLC (Stage IV) on alectinib   Dyspnea likely due to progression of NSCLC/pulmonary edema in setting of worsening anemia/renal function Pulmonary embolism possibility as well although patient has IVC filter in place and not tachycardic and saturating well on room air.  Said that last night he desaturated to 80s when sitting up. Patient has remained afebrile.  Anemia today improved to 9.0 from 8.3 yesterday.  Did have bibasilar crackles on examination as well as some expiratory wheezes bilaterally in posterior lung fields. -Med oncology following, appreciate recommendations -Alecensa 600 mg twice daily -Outpatient PET scan -CXR -IV lasix x 1 per MD  Acute renal failure in setting of CKD 3 AA  hyperkalemia  hyperphosphatemia  metabolic acidosis Likely secondary to contrast-induced ATN.  Currently at 270 mL urine output yesterday.  Bladder scans all were 0 -not retaining.  Said that he urinated overnight but very little amounts. K 4.8 today and phosphorus 7.2 today.  Creatinine worsened to 6.39 with GFR of 8 -Nephrology following, appreciate recommendations -d/c MIVF sodium bicarbonate at 115 mL/hr per nephro -IV Lasix  high-dose x1 -Avoid nephrotoxic agents -Twice daily renal function labs  Normocytic anemia Hemoglobin 9.0 with MCV 88.6 today.  LDH was elevated to 220 and haptoglobin pending.  Has thigh hematoma and was on enoxaparin. Iron 16-will start iron supplement today along with miralax. -Transfusion threshold of 8 -Monitor CBC -Monitor for bleeding -Consider VVS consult if worsening -Monitor for GI blood loss -ferrous sulfate  CAD s/p stent -Crestor, dose reduced for kidney function -Clopidogrel  History of recent stroke To be thromboembolic, not on anticoagulation due to anemia and recent significant bleeding event. -Plavix -Crestor  Left foot drop Continues to have issues with dorsiflexion and plantarflexion with decreased sensation of foot.  Most likely superficial peroneal nerve. -PT/OT -Fall precautions -Up with assistance  History DVT and PE S/p IVC placement and history of bleeding on Lovenox and Eliquis. -Avoiding anticoagulation due to hematoma/anemia -SCDs  FEN/GI: renal/veg diet with fluid restriction PPx: SCDs Dispo: Home with home health pending clinical improvement/renal function  Subjective:  Denies any pain or shortness of breath today, says that he urinated very small amounts overnight  Objective: Temp:  [97.3 F (36.3 C)-99.1 F (37.3 C)] 97.8 F (36.6 C) (05/21 0523) Pulse Rate:  [89-100] 98 (05/21 0544) Resp:  [15-20] 17 (05/21 0544) BP: (129-157)/(53-73) 150/71 (05/21 0544) SpO2:  [94 %-100 %] 99 % (05/21 0523) Physical Exam: General: NAD, laying in bed comfortably, alert and responsive to all questions Cardiovascular: RRR no m/r/g Respiratory: Bibasilar crackles, expiratory wheezes bialterally Abdomen: Nontender to palpation, soft, bowel sounds mildly hyperactive Extremities: No LE edema, unable to   Laboratory: Recent Labs  Lab 06/16/21 0141 06/16/21 1618 06/17/21 0444  WBC 6.9 7.2 7.7  HGB 8.2* 8.3* 9.0*  HCT 26.6* 25.6* 27.1*  PLT 274  269 274   Recent Labs  Lab 06/11/21 1350 06/15/21 0400 06/15/21 0413 06/16/21 1444 06/16/21 1622 06/17/21 0444  NA 137 136   < > 132* 135 135  K 4.3 5.2*   < > 3.8 5.4* 4.8  CL 108 109   < > 100 109 107  CO2 23 16*   < > 25 16* 18*  BUN 22 43*   < > 33* 52* 53*  CREATININE 1.83* 4.73*   < > 1.74* 6.10* 6.39*  CALCIUM 8.5* 8.5*   < > 8.0* 8.1* 8.0*  PROT 6.6 6.3*  --   --   --   --   BILITOT 1.4* 1.4*  --   --   --   --   ALKPHOS 90 86  --   --   --   --   ALT 11 13  --   --   --   --   AST 21 21  --   --   --   --   GLUCOSE 99 89   < > 113* 106* 99   < > = values in this interval not displayed.    Imaging/Diagnostic Tests: No results found.   Gerrit Heck, MD 06/17/2021, 7:26 AM PGY-1, [place] Intern pager: [phone], text pages welcome

## 2021-06-17 NOTE — Progress Notes (Signed)
OT Cancellation Note  Patient Details Name: Arthur [name] MRN: [id number] DOB: 05/25/1940   Cancelled Treatment:    Reason Eval/Treat Not Completed: Other (comment) Pt and family at bedside report that a staff member advised pt to avoid ambulating today (in hallway and in-room) due to SOB. Awaiting further instruction from MD prior to initiating OOB activities with OT eval.   Layla Maw 06/17/2021, 7:34 AM

## 2021-06-17 NOTE — Evaluation (Addendum)
Occupational Therapy Evaluation Patient Details Name: Arthur [name] MRN: [id number] DOB: 06/02/1940 Today's Date: 06/17/2021   History of Present Illness 81 yo male adm 5/19 with SOB. Rapid worsening of met disease with AKI. PMhx: stage 4 lung CA, type 2 DM, CAD s/p PCI, HLD, HTN, gout, GERD, prior PE   Clinical Impression   PTA, pt lives with family, typically Modified Independent with ADLs/mobility using RW. Cleared for OOB ADLs by MD prior to initiation of session. Pt presents now with deficits in cardiopulmonary tolerance, strength and dynamic standing balance. Pt's family present, encouraging and hands on to assist pt throughout. Overall, pt requires Setup Assist for UB ADL, Min A for LB ADLs and min guard for mobility using RW. Pt able to complete 2 bouts of mobility in room though 3/4 DOE noted with desats to 86% on RA. Pt quickly rebounds >96% with seated rest break. Emphasis on energy conservation strategies (handout provided) and breathing techniques to combat DOE. Pt receiving HHOT/PT prior to admission and would like to continue with these services at DC. Family able to provide physical assist as needed at DC. Will continue to follow acutely.      Recommendations for follow up therapy are one component of a multi-disciplinary discharge planning process, led by the attending physician.  Recommendations may be updated based on patient status, additional functional criteria and insurance authorization.   Follow Up Recommendations  Home health OT    Assistance Recommended at Discharge Intermittent Supervision/Assistance  Patient can return home with the following A little help with walking and/or transfers;A little help with bathing/dressing/bathroom;Assistance with cooking/housework;Help with stairs or ramp for entrance    Functional Status Assessment  Patient has had a recent decline in their functional status and demonstrates the ability to make significant improvements in  function in a reasonable and predictable amount of time.  Equipment Recommendations  None recommended by OT    Recommendations for Other Services       Precautions / Restrictions Precautions Precautions: Fall;Other (comment) Precaution Comments: watch sats, left foot drop Restrictions Weight Bearing Restrictions: No      Mobility Bed Mobility Overal bed mobility: Modified Independent                  Transfers Overall transfer level: Needs assistance Equipment used: Rolling walker (2 wheels) Transfers: Sit to/from Stand Sit to Stand: Min guard           General transfer comment: able to self correct hand placement, standing from bedside and chair with armrests at sink multiple times during ADLs      Balance Overall balance assessment: Needs assistance   Sitting balance-Leahy Scale: Good     Standing balance support: Bilateral upper extremity supported Standing balance-Leahy Scale: Fair                             ADL either performed or assessed with clinical judgement   ADL Overall ADL's : Needs assistance/impaired Eating/Feeding: Independent;Sitting   Grooming: Set up;Sitting;Wash/dry face;Brushing hair   Upper Body Bathing: Set up;Sitting   Lower Body Bathing: Minimal assistance;Sit to/from stand Lower Body Bathing Details (indicate cue type and reason): assist for peri region in standing, able to bathe anterior peri region Upper Body Dressing : Set up;Sitting Upper Body Dressing Details (indicate cue type and reason): assist for line mgmt Lower Body Dressing: Minimal assistance;Sit to/from stand Lower Body Dressing Details (indicate cue type and reason): assist to  don around B feet (due to grip socks, foot drop) Toilet Transfer: Min guard;Ambulation;Rolling walker (2 wheels)   Toileting- Clothing Manipulation and Hygiene: Minimal assistance;Sitting/lateral lean;Sit to/from stand       Functional mobility during ADLs: Min  guard;Rolling walker (2 wheels) General ADL Comments: Pt's son in law present, eager to push pt to progress mobility though based on SOB/desats in room, educated to stay in room for mobility with OT eval. Educated on energy conservatin strategies with handout provided.     Vision Baseline Vision/History: 1 Wears glasses Ability to See in Adequate Light: 0 Adequate Vision Assessment?: No apparent visual deficits     Perception     Praxis      Pertinent Vitals/Pain Pain Assessment Pain Assessment: No/denies pain     Hand Dominance Right   Extremity/Trunk Assessment Upper Extremity Assessment Upper Extremity Assessment: Generalized weakness   Lower Extremity Assessment Lower Extremity Assessment: Defer to PT evaluation   Cervical / Trunk Assessment Cervical / Trunk Assessment: Other exceptions Cervical / Trunk Exceptions: rounded shoulders   Communication Communication Communication: HOH;Prefers language other than English (can speak basic English but son in law present and assists with answering/interpreting as needed)   Cognition Arousal/Alertness: Awake/alert Behavior During Therapy: WFL for tasks assessed/performed Overall Cognitive Status: Within Functional Limits for tasks assessed                                       General Comments  Family entering at end of session to join pt's son in law that was already present    Exercises     Shoulder Instructions      Home Living Family/patient expects to be discharged to:: Private residence Living Arrangements: Spouse/significant other;Children Available Help at Discharge: Family;Available 24 hours/day Type of Home: House Home Access: Stairs to enter [employer] of Steps: 1   Home Layout: Able to live on main level with bedroom/bathroom;Two level     Bathroom Shower/Tub: Occupational psychologist: Standard     Home Equipment: Shower seat;Hand held Engineering geologist  (2 wheels);Cane - single point;Rollator (4 wheels)   Additional Comments: Rollator too short for pt and unable to be adjusted      Prior Functioning/Environment Prior Level of Function : Driving;Independent/Modified Independent             Mobility Comments: pt has been using RW for last month since development of left foot drop ADLs Comments: shower seat and able to perform ADLs on his own        OT Problem List: Decreased strength;Decreased activity tolerance;Impaired balance (sitting and/or standing);Cardiopulmonary status limiting activity      OT Treatment/Interventions: Self-care/ADL training;Therapeutic exercise;Energy conservation;DME and/or AE instruction;Therapeutic activities;Patient/family education    OT Goals(Current goals can be found in the care plan section) Acute Rehab OT Goals Patient Stated Goal: improve breathing, maintain activity level and independence OT Goal Formulation: With patient/family Time For Goal Achievement: 07/01/21 Potential to Achieve Goals: Good  OT Frequency: Min 2X/week    Co-evaluation              AM-PAC OT "6 Clicks" Daily Activity     Outcome Measure Help from another person eating meals?: None Help from another person taking care of personal grooming?: A Little Help from another person toileting, which includes using toliet, bedpan, or urinal?: A Little Help from another person bathing (including washing,  rinsing, drying)?: A Little Help from another person to put on and taking off regular upper body clothing?: A Little Help from another person to put on and taking off regular lower body clothing?: A Little 6 Click Score: 19   End of Session Equipment Utilized During Treatment: Rolling walker (2 wheels);Gait belt  Activity Tolerance: Patient tolerated treatment well Patient left: in bed;with call bell/phone within reach;with family/visitor present  OT Visit Diagnosis: Unsteadiness on feet (R26.81);Other abnormalities of  gait and mobility (R26.89)                Time: 8367-2550 OT Time Calculation (min): 37 min Charges:  OT General Charges $OT Visit: 1 Visit OT Evaluation $OT Eval Moderate Complexity: 1 Mod OT Treatments $Self Care/Home Management : 8-22 mins  Malachy Chamber, OTR/L Acute Rehab Services Office: [phone]   Layla Maw 06/17/2021, 10:18 AM

## 2021-06-17 NOTE — Progress Notes (Signed)
FPTS Brief Progress Note  S:Patient sleeping comfortably when I came in the room. Family bedside and states he has been doing well. States he has been urinating a bit. Last void was 125 ml. She asks when the next labs will be drawn. Denies any questions or concerns.    O: BP 139/61 (BP Location: Right Arm)   Pulse 90   Temp 98.4 F (36.9 C) (Oral)   Resp 19   Ht 5\' 11"  (1.803 m)   Wt 73.5 kg   SpO2 100%   BMI 22.59 kg/m   General: sleeping comfortably CV: RRR Resp: normal WOB on RA   A/P: Continue current management  - Orders reviewed. Labs for AM ordered, which was adjusted as needed.    Shary Key, DO 06/17/2021, 11:55 PM PGY-2, [facility] Night Resident  Please page [phone] with questions.

## 2021-06-17 NOTE — Progress Notes (Addendum)
[facility] Progress Note  Subjective: Pt voided several times overnight but small amts. Had some difficulty breathing overnight for the family member at bedside. O2 level dropped when standing. Pt feels okay this am, no SOB or cough, on RA.   Vitals:   06/16/21 2050 06/16/21 2245 06/17/21 0523 06/17/21 0544  BP:  129/73 (!) 157/66 (!) 150/71  Pulse:  93 98 98  Resp:  $Remo'19 17 17  'LeYVk$ Temp:  98.4 F (36.9 C) 97.8 F (36.6 C)   TempSrc:  Oral Oral   SpO2: 96% 94% 99%   Weight:      Height:        Exam: Gen alert, no distress No jvd or bruits Chest clear bilat to bases RRR no RG Abd soft ntnd no mass or ascites +bs Ext 1+ bilat LE edema L > R Neuro is alert, Ox 3 , nf, no asterixis    Home meds include - tylenol, alectinib, zyloprim, plavix, colchicine, voltaren gel, empagliflozin 10, ferr sulfate, norco, losartan 25, miralax, lyrica 50 bid, crestor, asper-flex cream      Date                          Creat               eGFR     2012- 2015              1.11- 1.32             2017- July 2022      1.14- 1.40        46- 60 ml/min                    Jan 2023                1.17-  1.52       46- >60      Feb 2023                1.24- 1.56        45- 59 ml/min, stage 3a      March 2023            1.15 May 2021                1.50     Jun 11, 2021          1.83     06/15/21                    4.73                 12     06/16/21                    5.31                 10 ml/min     UA 5/19 - 100 prot, rare bact, 0-5 rbc, 6-10 wbc, 0-5 epis    UNa 27, UCr 70      CXR 5/19- FINDINGS: Cardiac shadow is within normal limits. Lungs are well aerated bilaterally. No focal infiltrate or sizable effusion is noted. Old healed rib fractures on the left are noted. No acute bony abnormality is seen. IMPRESSION: No acute abnormality noted.  Renal US > 10-11 cm kidneys w/o hydro     Assessment/ Plan: AKI on CKD 3a - b/l creat is 1.2- 1.5 from early 2023, eGFR  45- 60 ml/min. Here creat is 4.7 several days after IV contrast exposure for CT scan on 5/15. No other nephrotoxic medications, no hypotension, no acei/ ARB / nsaids. UA unremarkable, renal US w/o obstruction. Urine lytes are c/w ATN.  Pt has ATN from contrast injury. Rx is supportive care. Will dc IVF"s given SOB/ low SpO2 overnight. CXR pending. Will fluid restrict to 1200 cc and will try IV Lasix high dose x 1. No uremic findings, no HD indication yet. Get bid labs for now.  Metabolic acidosis - due to severe AKI, a bit better but need to dc IVF"s for now Hyperkalemia - K+ better this am w/ lokelma and renal diet.  Lung cancer - taking po chemo Hypoxia - had SOB overnight, likely vol overload from IVF's causing resp issues. CXR ordered per pmd. IVF"s dc'd. See above also.       Arthur [name] 06/17/2021, 8:56 AM   Recent Labs  Lab 06/16/21 0141 06/16/21 1444 06/16/21 1618 06/16/21 1622 06/17/21 0444  HGB 8.2*  --  8.3*  --  9.0*  ALBUMIN 2.2*  --   --   --  2.4*  CALCIUM 8.3*   < >  --  8.1* 8.0*  PHOS 6.7*  --   --   --  7.2*  CREATININE 5.31*   < >  --  6.10* 6.39*  K 5.4*   < >  --  5.4* 4.8   < > = values in this interval not displayed.   Inpatient medications:  albuterol  2.5 mg Nebulization QID   alectinib  600 mg Oral BID WC   clopidogrel  75 mg Oral Daily   polyethylene glycol  17 g Oral Daily   rosuvastatin  5 mg Oral Daily    furosemide     guaiFENesin-dextromethorphan, HYDROcodone-acetaminophen, ipratropium-albuterol, pregabalin

## 2021-06-17 NOTE — Progress Notes (Signed)
Complained of dizziness while lying supine on bed. Not diaphoretic. CBG = 118. Patient said the room is hot.Adjusted thermostat.

## 2021-06-18 DIAGNOSIS — C3490 Malignant neoplasm of unspecified part of unspecified bronchus or lung: Secondary | ICD-10-CM | POA: Diagnosis not present

## 2021-06-18 LAB — CBC
HCT: 25.1 % — ABNORMAL LOW (ref 39.0–52.0)
Hemoglobin: 8.4 g/dL — ABNORMAL LOW (ref 13.0–17.0)
MCH: 29.1 pg (ref 26.0–34.0)
MCHC: 33.5 g/dL (ref 30.0–36.0)
MCV: 86.9 fL (ref 80.0–100.0)
Platelets: 260 10*3/uL (ref 150–400)
RBC: 2.89 MIL/uL — ABNORMAL LOW (ref 4.22–5.81)
RDW: 17.2 % — ABNORMAL HIGH (ref 11.5–15.5)
WBC: 6.5 10*3/uL (ref 4.0–10.5)
nRBC: 0 % (ref 0.0–0.2)

## 2021-06-18 LAB — BASIC METABOLIC PANEL
Anion gap: 11 (ref 5–15)
Anion gap: 12 (ref 5–15)
BUN: 58 mg/dL — ABNORMAL HIGH (ref 8–23)
BUN: 59 mg/dL — ABNORMAL HIGH (ref 8–23)
CO2: 21 mmol/L — ABNORMAL LOW (ref 22–32)
CO2: 21 mmol/L — ABNORMAL LOW (ref 22–32)
Calcium: 8.1 mg/dL — ABNORMAL LOW (ref 8.9–10.3)
Calcium: 8.2 mg/dL — ABNORMAL LOW (ref 8.9–10.3)
Chloride: 104 mmol/L (ref 98–111)
Chloride: 99 mmol/L (ref 98–111)
Creatinine, Ser: 7.33 mg/dL — ABNORMAL HIGH (ref 0.61–1.24)
Creatinine, Ser: 7.47 mg/dL — ABNORMAL HIGH (ref 0.61–1.24)
GFR, Estimated: 7 mL/min — ABNORMAL LOW (ref >60)
GFR, Estimated: 7 mL/min — ABNORMAL LOW (ref >60)
Glucose, Bld: 92 mg/dL (ref 70–99)
Glucose, Bld: 147 mg/dL — ABNORMAL HIGH (ref 70–99)
Potassium: 5.3 mmol/L — ABNORMAL HIGH (ref 3.5–5.1)
Potassium: 5.4 mmol/L — ABNORMAL HIGH (ref 3.5–5.1)
Sodium: 136 mmol/L (ref 135–145)
Sodium: 132 mmol/L — ABNORMAL LOW (ref 135–145)

## 2021-06-18 MED ORDER — FUROSEMIDE 10 MG/ML IJ SOLN
160.0000 mg | Freq: Three times a day (TID) | INTRAVENOUS | Status: DC
  Administered 2021-06-18 – 2021-06-19 (×2): 160 mg via INTRAVENOUS
  Filled 2021-06-18 (×2): qty 15
  Filled 2021-06-18 (×2): qty 16

## 2021-06-18 MED ORDER — SODIUM ZIRCONIUM CYCLOSILICATE 10 G PO PACK
10.0000 g | PACK | Freq: Two times a day (BID) | ORAL | Status: DC
  Administered 2021-06-18 – 2021-06-19 (×2): 10 g via ORAL
  Filled 2021-06-18 (×3): qty 1

## 2021-06-18 MED ORDER — BUSPIRONE HCL 5 MG PO TABS
5.0000 mg | ORAL_TABLET | Freq: Two times a day (BID) | ORAL | Status: DC
  Administered 2021-06-18 – 2021-06-25 (×16): 5 mg via ORAL
  Filled 2021-06-18 (×16): qty 1

## 2021-06-18 NOTE — Consult Note (Addendum)
Hospital Consult    Reason for Consult:  thigh hematoma Requesting Physician:  Gerrit Heck, MD MRN #:  [id number]  History of Present Illness: This is a very pleasant 81 y.o. male with pertinent past medical history including stage IV NSCLC on oral chemotherapy, CAD s/p PCI, CKD, Type II Diabetes Mellitus, HLD, HTN , Gerd, and PE/ DVT s/p IVC filter who presented to ED with shortness of breath. He has history of PE's and DVTs previously on Eliquis and transitioned to Lovenox in February of this year due to recurrence of DVTs, however developed a large left thigh hematoma at the end of March and new popliteal DVT on lovenox so he had an IVC filter placed on 04/28/21 at [place]. Overall he reports that his leg is doing better and he has been ambulating much better on his leg at home using walker since the initial hematoma and DVT in March. There has been some concern about peroneal nerve injury with his left foot drop since his most recent DVT. He continues to have trouble with numbness in his left foot and toes as well as continued swelling in his left leg. His left leg pain has resolved. Daughter who was on the phone explained that he has been wearing a thigh high compression stocking at home and elevating intermittently.  Vascular surgery was consulted for evaluation/ management of large left thigh hematoma.   Past Medical History:  Diagnosis Date   Diabetes mellitus without complication ([place])    High cholesterol    Hypertension    lung ca    Normal nuclear stress test 01/29/2008   stress perfusion study apparently in 2010 in [facility] which he said was negative.    Past Surgical History:  Procedure Laterality Date   CATARACT EXTRACTION, BILATERAL  2006   CORONARY STENT INTERVENTION N/A 02/06/2021   Procedure: CORONARY STENT INTERVENTION;  Surgeon: Jettie Booze, MD;  Location: [place] [facility];  Service: Cardiovascular;  Laterality: N/A;   FINE NEEDLE ASPIRATION   03/21/2021   Procedure: FINE NEEDLE ASPIRATION;  Surgeon: Garner Nash, DO;  Location: [place];  Service: Pulmonary;;   INTRAVASCULAR ULTRASOUND/IVUS N/A 02/06/2021   Procedure: Intravascular Ultrasound/IVUS;  Surgeon: Jettie Booze, MD;  Location: [place] [facility];  Service: Cardiovascular;  Laterality: N/A;   KNEE SURGERY  2005   LEFT HEART CATH AND CORONARY ANGIOGRAPHY N/A 02/06/2021   Procedure: LEFT HEART CATH AND CORONARY ANGIOGRAPHY;  Surgeon: Jettie Booze, MD;  Location: [place] [facility];  Service: Cardiovascular;  Laterality: N/A;   VIDEO BRONCHOSCOPY WITH ENDOBRONCHIAL ULTRASOUND Bilateral 03/21/2021   Procedure: VIDEO BRONCHOSCOPY WITH ENDOBRONCHIAL ULTRASOUND;  Surgeon: Garner Nash, DO;  Location: [place];  Service: Pulmonary;  Laterality: Bilateral;    Allergies  Allergen Reactions   Ace Inhibitors     REACTION: Cough    Prior to Admission medications   Medication Sig Start Date End Date Taking? Authorizing Provider  acetaminophen (TYLENOL) 650 MG CR tablet Take 650 mg by mouth every 8 (eight) hours as needed for pain.   Yes [provider]  alectinib (ALECENSA) 150 MG capsule Take 4 capsules (600 mg total) by mouth 2 (two) times daily with a meal. 04/09/21  Yes Curt Bears, MD  allopurinol (ZYLOPRIM) 100 MG tablet Take 1 tablet (100 mg total) by mouth daily. 03/13/21  Yes Lind Covert, MD  clopidogrel (PLAVIX) 75 MG tablet Take 1 tablet (75 mg total) by mouth daily. 04/04/21  Yes Chambliss,  Jeb Levering, MD  colchicine 0.6 MG tablet Take 1/2 tablet (0.3 mg total) by mouth daily. 03/22/21  Yes Erskine Emery, MD  diclofenac Sodium (VOLTAREN) 1 % GEL Apply 2 g topically 4 (four) times daily. Patient taking differently: Apply 2-4 g topically 4 (four) times daily as needed (knees/legs). 01/31/21  Yes Chambliss, Jeb Levering, MD  empagliflozin (JARDIANCE) 10 MG TABS tablet Take 1 tablet (10 mg total) by mouth daily. 04/04/21  Yes  Chambliss, Jeb Levering, MD  ferrous sulfate 324 (65 Fe) MG TBEC Take 324 mg by mouth daily. 08/17/20  Yes Lind Covert, MD  HYDROcodone-acetaminophen (NORCO/VICODIN) 5-325 MG tablet Take 1 tablet by mouth every 6 (six) hours as needed for moderate pain. 05/07/21  Yes [provider]  losartan (COZAAR) 25 MG tablet Take 1 tablet (25 mg total) by mouth daily. 04/04/21  Yes Lind Covert, MD  metFORMIN (GLUCOPHAGE) 1000 MG tablet Take 1 tablet (1,000 mg total) by mouth 2 (two) times daily with a meal. 01/31/21  Yes Chambliss, Jeb Levering, MD  polyethylene glycol powder (GLYCOLAX/MIRALAX) 17 GM/SCOOP powder Take 17 g by mouth daily. 04/23/21  Yes Sowell, Erlene Quan, MD  pregabalin (LYRICA) 50 MG capsule Take 50 mg by mouth 2 (two) times daily as needed (pain). As needed 05/07/21  Yes [provider]  rosuvastatin (CRESTOR) 40 MG tablet Take 1 tablet (40 mg total) by mouth daily. 04/04/21  Yes Lind Covert, MD  trolamine salicylate (ASPER-FLEX) 10 % cream Apply 1 application topically as needed for muscle pain. 04/08/18  Yes Chambliss, Jeb Levering, MD    Social History   Socioeconomic History   Marital status: Married    Spouse name: Not on file   Number of children: Not on file   Years of education: Not on file   Highest education level: Not on file  Occupational History   Not on file  Tobacco Use   Smoking status: Never   Smokeless tobacco: Never  Substance and Sexual Activity   Alcohol use: No   Drug use: No   Sexual activity: Not on file  Other Topics Concern   Not on file  Social History Narrative   Social History:   He works part-time as a Doctor, general practice. He is married with 3 children. He's never smoked cigarettes and does not drink alcohol.            Social Determinants of Health   Financial Resource Strain: Not on file  Food Insecurity: Not on file  Transportation Needs: Not on file  Physical Activity: Not on file  Stress: Not on file  Social  Connections: Not on file  Intimate Partner Violence: Not on file     Family History  Problem Relation Age of Onset   Coronary artery disease Father    Coronary artery disease Mother     ROS: Otherwise negative unless mentioned in HPI  Physical Examination  Vitals:   06/17/21 1243 06/18/21 0515  BP: 139/61 134/64  Pulse: 90 89  Resp: 19 19  Temp: 98.4 F (36.9 C) 97.9 F (36.6 C)  SpO2: 100% 95%   Body mass index is 20.5 kg/m.  General:  WDWN in NAD; very pleasant Gait: Not observed HENT: WNL, normocephalic Pulmonary: normal non-labored breathing, without wheezing Cardiac: regular, without  Murmurs Abdomen: flat,  soft Skin: without rashes Vascular Exam/Pulses: 2+ femoral pulses bilaterally, 2+ Dp and PT pulses bilaterally. Feet are warm and well perfused. Extremities: without ischemic changes, without Gangrene , without cellulitis;  without open wounds; decreased sensation of left foot and toes, decreased motor in left foot and toes with foot drop; Left lower extremity edematous up to proximal left thigh, left thigh soft, no appreciable ecchymosis, non tender Musculoskeletal: no muscle wasting or atrophy  Neurologic: A&O X 3;  No focal weakness or paresthesias are detected; speech is fluent/normal Psychiatric:  The pt has Normal affect. Lymph:  Unremarkable  CBC    Component Value Date/Time   WBC 6.5 06/18/2021 0620   RBC 2.89 (L) 06/18/2021 0620   HGB 8.4 (L) 06/18/2021 0620   HGB 9.8 (L) 06/11/2021 1350   HGB 11.4 (L) 03/27/2021 1027   HCT 25.1 (L) 06/18/2021 0620   HCT 35.8 (L) 03/27/2021 1027   PLT 260 06/18/2021 0620   PLT 291 06/11/2021 1350   PLT 296 03/27/2021 1027   MCV 86.9 06/18/2021 0620   MCV 81 03/27/2021 1027   MCH 29.1 06/18/2021 0620   MCHC 33.5 06/18/2021 0620   RDW 17.2 (H) 06/18/2021 0620   RDW 13.4 03/27/2021 1027   LYMPHSABS 1.4 06/17/2021 0444   LYMPHSABS 2.2 08/17/2020 0848   MONOABS 0.8 06/17/2021 0444   EOSABS 0.1 06/17/2021  0444   EOSABS 0.4 08/17/2020 0848   BASOSABS 0.1 06/17/2021 0444   BASOSABS 0.1 08/17/2020 0848    BMET    Component Value Date/Time   NA 136 06/18/2021 0620   NA 141 03/27/2021 1027   K 5.3 (H) 06/18/2021 0620   CL 104 06/18/2021 0620   CO2 21 (L) 06/18/2021 0620   GLUCOSE 92 06/18/2021 0620   BUN 58 (H) 06/18/2021 0620   BUN 20 03/27/2021 1027   CREATININE 7.33 (H) 06/18/2021 0620   CREATININE 1.83 (H) 06/11/2021 1350   CREATININE 1.18 11/15/2015 1114   CALCIUM 8.1 (L) 06/18/2021 0620   GFRNONAA 7 (L) 06/18/2021 0620   GFRNONAA 37 (L) 06/11/2021 1350   GFRNONAA 60 11/15/2015 1114   GFRAA 58 (L) 09/08/2019 1011   GFRAA 69 11/15/2015 1114    COAGS: Lab Results  Component Value Date   INR 1.3 (H) 02/03/2021   INR 1.20 05/18/2015     Non-Invasive Vascular Imaging:  MR Femur Left wo Contrast 06/15/21  IMPRESSION: 1. Large well-defined fluid collection within the posterior compartment of the mid left thigh measuring 23.0 x 4.9 x 6.5 cm.Appearance is most consistent with a hematoma. 2. No acute osseous abnormality.  No suspicious bone lesion. 3. Large right inguinal hernia containing bowel. 4. Posterior horn of the medial meniscus of the left knee is torn. 5. Small-moderate-sized Baker's cyst.    Statin:  Yes.   Beta Blocker:  No. Aspirin:  No. ACEI:  No. ARB:  Yes.   CCB use:  No Other antiplatelets/anticoagulants:  Yes.   Plavix   ASSESSMENT/PLAN: This is a 81 y.o. 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 MR of femur does show large posterior left thigh hematoma. Reviewed CT in Care Everywhere from [facility] that also showed large left thigh hematoma. Conservative management previously recommended.  -Due to extensive size of hematoma and chronicity would not recommend surgical intervention. Discussed with patient that this  should resolve with time and due to its large size may take extensive time for resolution. There is increased risk with surgical evacuation to develop further complications such as recurrence, infection, and extensive wound healing time - Recommend thigh  high compression stocking and elevation of extremity. I have placed order for Thigh high compression. Patient does have one at home but can start wearing this during hospitalization - Talked to patients daughter on the phone to discuss recommendations with her. Patient and daughter did not have any further concerns or questions -the on call vascular surgeon, Dr. Carlis Abbott, will see and evaluate patient later today to provide any further recommendations   Karoline Caldwell PA-C [facility] [phone] 06/18/2021  12:26 PM  I have seen and evaluated the patient. I agree with the PA note as documented above.  Vascular surgery has been consulted for a chronic left thigh hematoma initially diagnosed in March 2023 in review of care everywhere.  He had a CT on 04/27/2021 while hospitalized at [facility] with larger posterior left thigh hematoma.  At that time he was on anticoagulation for recurrent DVT PEs.  After he developed the left posterior thigh hematoma he was taken off anticoagulation and had an IVC filter placed.  [facility]  He denies any pain in his leg from the hematoma.  He has no leg swelling.  The skin is intact with no compromise and no breakdown.  I do not see any indication to surgically address a chronic hematoma from 2 months ago.  He does have a left foot drop and I am unclear about the etiology for this but according to the son this occurred about 1.5 to 2 months ago when his hematoma first occurred.  Certainly if there was compression months ago hopefully this would improve with conservative management.  He is already wearing compression stockings.  Vascular available if needed.  Would benefit from  foot drop splint.  On exam his thigh is very soft with no signs of compartment syndrome and 2+ palpable left DP pulse.  Marty Heck, MD [facility] [place] Office: [phone]

## 2021-06-18 NOTE — Progress Notes (Signed)
[facility] Progress Note  Assessment/ Plan: AKI on CKD 3a - b/l creat is 1.2- 1.5 from early 2023, eGFR 45- 60 ml/min. Here creat is 4.7 several days after IV contrast exposure for CT scan on 5/15. No other nephrotoxic medications, no hypotension, no acei/ ARB / nsaids. UA unremarkable, renal US w/o obstruction. Urine lytes are c/w ATN.  Pt has ATN from contrast injury. Rx is supportive care. Will dc IVF"s given SOB/ low SpO2 overnight. CXR congestion. Fluid restrict to 1200 cc and will increase IV Lasix to 160mg q8hr + Lokelma. Hopefully he can recover renal function and avoid RRT; I spoke with the pt and son who was bedside and they understand that it's possible he requires temporary HD in the next 24-72 hrs. No uremic findings, no HD absolute indication yet.  Metabolic acidosis - due to severe AKI, a bit better but needed to dc IVF"s for now Hyperkalemia - Lokelma tonight and renal diet.  Lung cancer - taking po chemo Hypoxia - had SOB overnight, likely vol overload from IVF's causing resp issues. CXR ordered per pmd. IVF"s dc'd. See above also.  Subjective: Pt voided several times ~200ml each time. Minimal dyspnea but denies CP / fever/ chills/ nausea.   Vitals:   06/17/21 1243 06/18/21 0515 06/18/21 0600 06/18/21 0913  BP: 139/61 134/64    Pulse: 90 89    Resp: 19 19    Temp: 98.4 F (36.9 C) 97.9 F (36.6 C)    TempSrc: Oral Oral    SpO2: 100% 95%    Weight:   79.8 kg 66.7 kg  Height:        Exam: Gen alert, no distress No jvd or bruits Chest clear bilat to bases RRR no RG Abd soft ntnd no mass or ascites +bs Ext 1+ bilat LE edema L > R Neuro is alert, Ox 3 , nf, no asterixis    Home meds include - tylenol, alectinib, zyloprim, plavix, colchicine, voltaren gel, empagliflozin 10, ferr sulfate, norco, losartan 25, miralax, lyrica 50 bid, crestor, asper-flex cream      Date                          Creat               eGFR     2012- 2015              1.11-  1.32             2017- July 2022      1.14- 1.40        46- 60 ml/min                    Jan 2023                1.17-  1.52       46- >60      Feb 2023                1.24- 1.56        45- 59 ml/min, stage 3a      March 2023            1.15 May 2021                1.50       Jun 11, 2021          1.83     06/15/21                    4.73                 12     06/16/21                    5.31                 10 ml/min     UA 5/19 - 100 prot, rare bact, 0-5 rbc, 6-10 wbc, 0-5 epis    UNa 27, UCr 70      CXR 5/19- FINDINGS: Cardiac shadow is within normal limits. Lungs are well aerated bilaterally. No focal infiltrate or sizable effusion is noted. Old healed rib fractures on the left are noted. No acute bony abnormality is seen. IMPRESSION: No acute abnormality noted.     Renal US > 10-11 cm kidneys w/o hydro         Recent Labs  Lab 06/16/21 0141 06/16/21 1444 06/17/21 0444 06/17/21 1626 06/18/21 0620 06/18/21 1627  HGB 8.2*   < > 9.0*  --  8.4*  --   ALBUMIN 2.2*  --  2.4*  --   --   --   CALCIUM 8.3*   < > 8.0*   < > 8.1* 8.2*  PHOS 6.7*  --  7.2*  --   --   --   CREATININE 5.31*   < > 6.39*   < > 7.33* 7.47*  K 5.4*   < > 4.8   < > 5.3* 5.4*   < > = values in this interval not displayed.   Inpatient medications:  alectinib  600 mg Oral BID WC   busPIRone  5 mg Oral BID   clopidogrel  75 mg Oral Daily   ferrous sulfate  325 mg Oral Q breakfast   polyethylene glycol  17 g Oral Daily   rosuvastatin  5 mg Oral Daily    furosemide 60 mL/hr at 06/18/21 1500   guaiFENesin-dextromethorphan, HYDROcodone-acetaminophen, ipratropium-albuterol, pregabalin       

## 2021-06-18 NOTE — Progress Notes (Signed)
Physical Therapy Treatment Patient Details Name: Arthur [name] MRN: [id number] DOB: 1940/07/09 Today's Date: 06/18/2021   History of Present Illness 81 yo male adm 5/19 with SOB. Rapid worsening of met disease with AKI. PMhx: stage 4 lung CA, type 2 DM, CAD s/p PCI, HLD, HTN, gout, GERD, prior PE    PT Comments    Pt pleasant and reports no shoes at home fit due to edema. Trialed off the shelf AFO with 2 layers of sock with improved standing and gait. Family instructed to obtain shoes that will accommodate edema for fitting of AFO. Pt with improved SPO2 with gait and decreased fatigue with pt and son happy to have trialed AFO. MD informed for order. Will continue to follow.     Recommendations for follow up therapy are one component of a multi-disciplinary discharge planning process, led by the attending physician.  Recommendations may be updated based on patient status, additional functional criteria and insurance authorization.  Follow Up Recommendations  Home health PT     Assistance Recommended at Discharge Intermittent Supervision/Assistance  Patient can return home with the following Assistance with cooking/housework;Assist for transportation   Equipment Recommendations  Other (comment) (LLE off the shelf AFO)    Recommendations for Other Services       Precautions / Restrictions Precautions Precautions: Fall;Other (comment) Precaution Comments: watch sats, left foot drop     Mobility  Bed Mobility Overal bed mobility: Modified Independent             General bed mobility comments: increased time and effort with bed flat    Transfers Overall transfer level: Needs assistance   Transfers: Sit to/from Stand Sit to Stand: Min guard           General transfer comment: left foot blocked not to slide with cues for hand placement    Ambulation/Gait Ambulation/Gait assistance: Min guard Gait Distance (Feet): 60 Feet Assistive device: Rolling walker (2  wheels) Gait Pattern/deviations: Step-through pattern, Decreased stance time - left   Gait velocity interpretation: 1.31 - 2.62 ft/sec, indicative of limited community ambulator   General Gait Details: pt with use of off the shelf AFO between 2 socks as shoe not present. Pt with improved foot clearance and decreased steppage with use of AFO. SPO2 90% on RA with gait with rise to 97% on RA at rest. Pt with edema limiting wear of AFO and removed immediately at rest   Stairs             Wheelchair Mobility    Modified Rankin (Stroke Patients Only)       Balance Overall balance assessment: Needs assistance   Sitting balance-Leahy Scale: Good Sitting balance - Comments: static sitting without UE support   Standing balance support: Bilateral upper extremity supported Standing balance-Leahy Scale: Poor Standing balance comment: single and bil UE support in standing                            Cognition Arousal/Alertness: Awake/alert Behavior During Therapy: WFL for tasks assessed/performed Overall Cognitive Status: Within Functional Limits for tasks assessed                                          Exercises      General Comments        Pertinent Vitals/Pain Pain Assessment Pain Assessment: No/denies  pain    Home Living                          Prior Function            PT Goals (current goals can now be found in the care plan section) Progress towards PT goals: Progressing toward goals    Frequency    Min 2X/week      PT Plan Current plan remains appropriate    Co-evaluation              AM-PAC PT "6 Clicks" Mobility   Outcome Measure  Help needed turning from your back to your side while in a flat bed without using bedrails?: None Help needed moving from lying on your back to sitting on the side of a flat bed without using bedrails?: None Help needed moving to and from a bed to a chair (including a  wheelchair)?: A Little Help needed standing up from a chair using your arms (e.g., wheelchair or bedside chair)?: A Little Help needed to walk in hospital room?: A Little Help needed climbing 3-5 steps with a railing? : A Little 6 Click Score: 20    End of Session Equipment Utilized During Treatment: Gait belt Activity Tolerance: Patient tolerated treatment well Patient left: in chair;with call bell/phone within reach;with family/visitor present Nurse Communication: Mobility status PT Visit Diagnosis: Difficulty in walking, not elsewhere classified (R26.2);Muscle weakness (generalized) (M62.81)     Time: 1194-1740 PT Time Calculation (min) (ACUTE ONLY): 28 min  Charges:  $Gait Training: 8-22 mins $Therapeutic Activity: 8-22 mins                     Kerrin Markman P, PT Acute Rehabilitation Services Pager: [phone] Office: [place] 06/18/2021, 11:28 AM

## 2021-06-18 NOTE — Progress Notes (Signed)
Asked daughter to bring in the patients home medication Alectinib. Request made by [facility]. Daughter states she will bring in this medication later today.

## 2021-06-18 NOTE — Progress Notes (Signed)
Family Medicine Teaching Service Daily Progress Note Intern Pager: [phone]  Patient name: Arthur [name] Medical record number: [id number] Date of birth: [birth year] Age: 81 y.o. Gender: male  Primary Care Provider: Lind Covert, MD Consultants: Medical oncology, nephrology Code Status: Full code  Pt Overview and Major Events to Date:  5/19: Admitted with dyspnea, found to have profound AKI 5/20: Hemoglobin downtrending, dyspnea improved entirely  Assessment and Plan:  [name] is an 81 year old male presenting with dyspnea likely multifactorial, due to progression of NSCLC. History significant for CAD s/p stent (January),  pulmonary embolism  (January), DVT on apixaban->enoxaparin, LLE hematoma requiring transfusion and IVC filter placement (April), and NSCLC (Stage IV) on alectinib    Dyspnea likely due to progression of NSCLC/pulmonary edema in setting of worsening anemia/renal function Dyspnea improved today saturating well on room air, saturating on room air well. Hemoglobin stable 8.4 today. CXR showed some pulmonary edema. -med onc outpatient f/u -alecensa 600 mg BID -outpatient PET scan -nephro following  Acute renal failure in setting of CKD 3 AA  hyperkalemia  hyperphosphatemia  metabolic acidosis Secondary to contrast-induced ATN.  UOP 725 mls with 5 unmeasured urine outputs.  Creatinine 7.33 from 6.74 yesterday. -Nephrology following, appreciate care and recs -Lasix 100 mg IV every 8 hours per nephrology -Avoid nephrotoxic agents -Follow-up twice daily renal labs  Normocytic anemia Hemoglobin 8.4 from 9.0 yesterday. -Monitor -Ferrous sulfate  CAD s/p stent -Crestor, dose reduced for kidney function -Clopidogrel  History of recent stroke Thought to be thromboembolic on on anticoagulation due to anemia and recent significant bleeding event. -Plavix -Crestor  Left foot drop  Hematoma Still unable to dorsiflex plantarflex with decreased  sensation.  Most likely due to superficial peroneal nerve. Left thigh hematoma. -Will discuss with [facility] about hematoma mgmt -PT/OT -Fall precautions -Up with assistance  History of DVT and PE S/p IVC placement and history of bleeding on Lovenox and Eliquis -Avoiding anticoagulation due to hematoma/anemia -SCDs  Anxiety -Buspar 5 BID added  GOC -monitor for if family wanting to involve palliative care  FEN/GI: Renal/veg diet with fluid restriction PPx: SCDs Dispo: Home with Axis pending renal function  Subjective:  Denies any pain or shortness of breath today except for when he is getting up.  Has been urinating a little bit  Objective: Temp:  [97.9 F (36.6 C)-98.4 F (36.9 C)] 97.9 F (36.6 C) (05/22 0515) Pulse Rate:  [89-90] 89 (05/22 0515) Resp:  [19] 19 (05/22 0515) BP: (134-139)/(61-64) 134/64 (05/22 0515) SpO2:  [95 %-100 %] 95 % (05/22 0515) Weight:  [79.8 kg] 79.8 kg (05/22 0600) Physical Exam: General: NAD, laying in bed comfortably alert and responsive to all questions Cardiovascular: RRR no murmurs rubs or gallops Respiratory: Clear to auscultation bilaterally mild expiratory wheezes, no crackles Abdomen: Nontender to palpation, soft Extremities: No LE edema, unable to dorsiflex or plantarflex  Laboratory: Recent Labs  Lab 06/16/21 0141 06/16/21 1618 06/17/21 0444  WBC 6.9 7.2 7.7  HGB 8.2* 8.3* 9.0*  HCT 26.6* 25.6* 27.1*  PLT 274 269 274   Recent Labs  Lab 06/11/21 1350 06/15/21 0400 06/15/21 0413 06/16/21 1622 06/17/21 0444 06/17/21 1626  NA 137 136   < > 135 135 134*  K 4.3 5.2*   < > 5.4* 4.8 5.1  CL 108 109   < > 109 107 102  CO2 23 16*   < > 16* 18* 19*  BUN 22 43*   < > 52* 53* 56*  CREATININE  1.83* 4.73*   < > 6.10* 6.39* 6.74*  CALCIUM 8.5* 8.5*   < > 8.1* 8.0* 8.0*  PROT 6.6 6.3*  --   --   --   --   BILITOT 1.4* 1.4*  --   --   --   --   ALKPHOS 90 86  --   --   --   --   ALT 11 13  --   --   --   --   AST 21 21  --   --   --    --   GLUCOSE 99 89   < > 106* 99 120*   < > = values in this interval not displayed.    Imaging/Diagnostic Tests: DG Chest 2 View  Result Date: 06/17/2021 CLINICAL DATA:  Shortness of breath EXAM: CHEST - 2 VIEW COMPARISON:  Jun 15, 2021 chest x-ray FINDINGS: Cardiomegaly. The hila and mediastinum are unchanged. No pneumothorax. Increasing interstitial opacities in the lungs, a little more patchy in the bases. Small pleural effusions best appreciated on the lateral view. No other interval changes. IMPRESSION: Cardiomegaly, increasing interstitial opacities, and small pleural effusions are suggestive of pulmonary edema. Recommend clinical correlation. Electronically Signed   By: Dorise Bullion III M.D.   On: 06/17/2021 13:46     Gerrit Heck, MD 06/18/2021, 7:02 AM PGY-1, [place] Intern pager: [phone], text pages welcome

## 2021-06-19 ENCOUNTER — Inpatient Hospital Stay (HOSPITAL_COMMUNITY): Payer: Medicare Other

## 2021-06-19 DIAGNOSIS — S7012XS Contusion of left thigh, sequela: Secondary | ICD-10-CM

## 2021-06-19 DIAGNOSIS — D649 Anemia, unspecified: Secondary | ICD-10-CM

## 2021-06-19 LAB — BASIC METABOLIC PANEL
Anion gap: 10 (ref 5–15)
BUN: 61 mg/dL — ABNORMAL HIGH (ref 8–23)
CO2: 19 mmol/L — ABNORMAL LOW (ref 22–32)
Calcium: 8 mg/dL — ABNORMAL LOW (ref 8.9–10.3)
Chloride: 105 mmol/L (ref 98–111)
Creatinine, Ser: 7.59 mg/dL — ABNORMAL HIGH (ref 0.61–1.24)
GFR, Estimated: 7 mL/min — ABNORMAL LOW (ref >60)
Glucose, Bld: 89 mg/dL (ref 70–99)
Potassium: 4.8 mmol/L (ref 3.5–5.1)
Sodium: 134 mmol/L — ABNORMAL LOW (ref 135–145)

## 2021-06-19 LAB — RENAL FUNCTION PANEL
Albumin: 2.6 g/dL — ABNORMAL LOW (ref 3.5–5.0)
Anion gap: 14 (ref 5–15)
BUN: 65 mg/dL — ABNORMAL HIGH (ref 8–23)
CO2: 23 mmol/L (ref 22–32)
Calcium: 8.4 mg/dL — ABNORMAL LOW (ref 8.9–10.3)
Chloride: 97 mmol/L — ABNORMAL LOW (ref 98–111)
Creatinine, Ser: 7.54 mg/dL — ABNORMAL HIGH (ref 0.61–1.24)
GFR, Estimated: 7 mL/min — ABNORMAL LOW (ref >60)
Glucose, Bld: 141 mg/dL — ABNORMAL HIGH (ref 70–99)
Phosphorus: 8.6 mg/dL — ABNORMAL HIGH (ref 2.5–4.6)
Potassium: 4.8 mmol/L (ref 3.5–5.1)
Sodium: 134 mmol/L — ABNORMAL LOW (ref 135–145)

## 2021-06-19 LAB — CBC
HCT: 25.5 % — ABNORMAL LOW (ref 39.0–52.0)
Hemoglobin: 8.4 g/dL — ABNORMAL LOW (ref 13.0–17.0)
MCH: 28.9 pg (ref 26.0–34.0)
MCHC: 32.9 g/dL (ref 30.0–36.0)
MCV: 87.6 fL (ref 80.0–100.0)
Platelets: 270 10*3/uL (ref 150–400)
RBC: 2.91 MIL/uL — ABNORMAL LOW (ref 4.22–5.81)
RDW: 17.2 % — ABNORMAL HIGH (ref 11.5–15.5)
WBC: 6.5 10*3/uL (ref 4.0–10.5)
nRBC: 0 % (ref 0.0–0.2)

## 2021-06-19 LAB — HAPTOGLOBIN: Haptoglobin: <10 mg/dL — ABNORMAL LOW (ref 34–355)

## 2021-06-19 LAB — RESP PANEL BY RT-PCR (FLU A&B, COVID) ARPGX2
Influenza A by PCR: NEGATIVE
Influenza B by PCR: NEGATIVE
SARS Coronavirus 2 by RT PCR: NEGATIVE

## 2021-06-19 IMAGING — DX DG CHEST 1V PORT
1 series · 1 of 1 positions shown · non-contrast
Comparison: [DATE]

CLINICAL DATA: Shortness of breath and fever.

EXAM:
PORTABLE CHEST 1 VIEW

[chest]
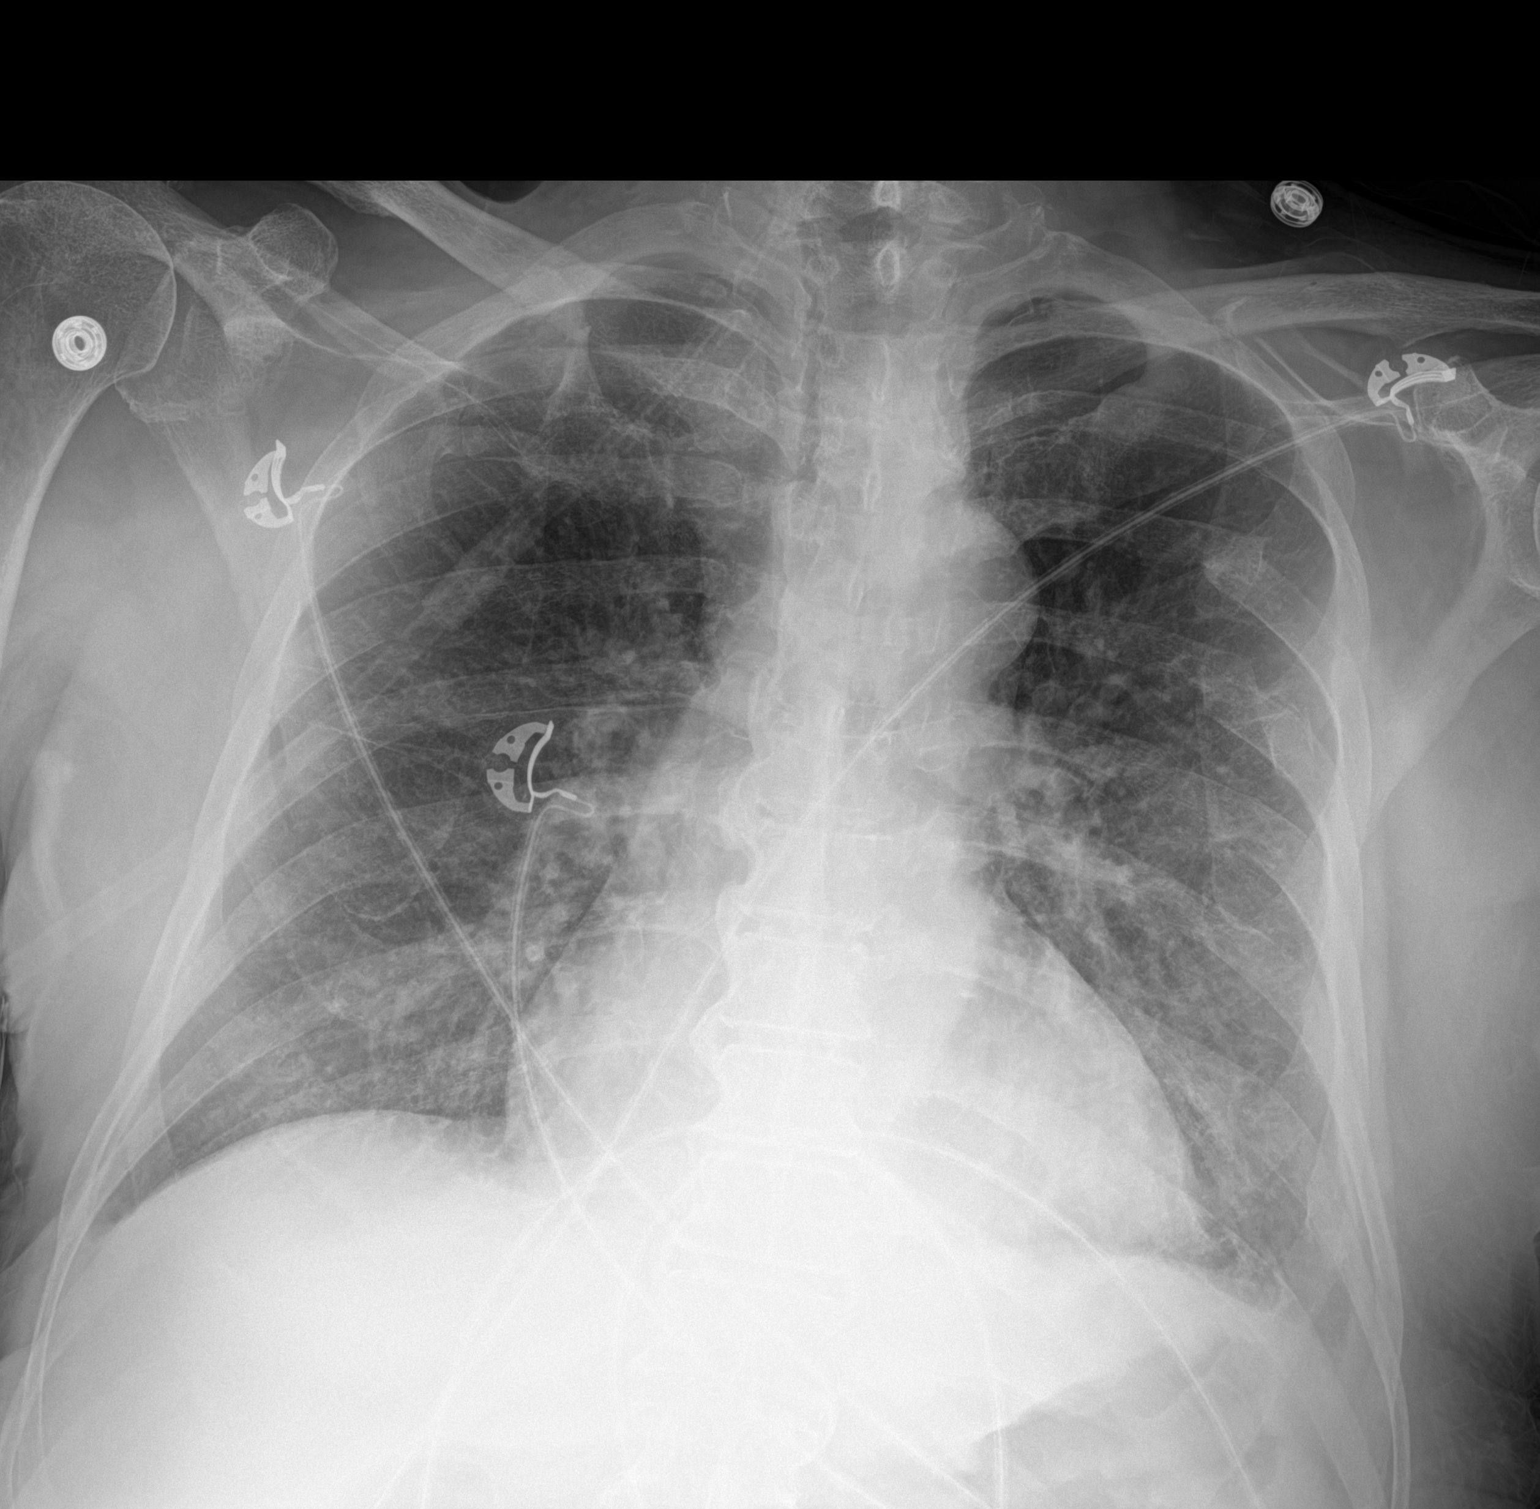

[1 of 1 positions shown; findings below may reference images not displayed]

FINDINGS: The cardiac silhouette is mildly enlarged and unchanged in size.
Mild, stable diffusely increased interstitial lung markings are
seen. Mild linear scarring and/or atelectasis is seen within the
left lung base. There is no evidence of a pleural effusion or
pneumothorax. Multiple chronic left-sided rib fractures are seen.
IMPRESSION: 1. Stable exam without acute or active cardiopulmonary disease.

## 2021-06-19 MED ORDER — GUAIFENESIN 100 MG/5ML PO LIQD
5.0000 mL | ORAL | Status: DC | PRN
  Administered 2021-06-22: 5 mL via ORAL
  Filled 2021-06-19: qty 10

## 2021-06-19 MED ORDER — ALBUTEROL SULFATE HFA 108 (90 BASE) MCG/ACT IN AERS: 2.0000 | INHALATION_SPRAY | Freq: Four times a day (QID) | RESPIRATORY_TRACT | Status: DC | PRN

## 2021-06-19 MED ORDER — ACETAMINOPHEN 325 MG PO TABS
650.0000 mg | ORAL_TABLET | Freq: Four times a day (QID) | ORAL | Status: DC | PRN
  Administered 2021-06-19 – 2021-06-20 (×2): 650 mg via ORAL
  Filled 2021-06-19 (×2): qty 2

## 2021-06-19 NOTE — TOC Initial Note (Signed)
Transition of Care [facility]) - Initial/Assessment Note    Patient Details  Name: Arthur [name] MRN: [id number] Date of Birth: Dec 01, 1940  Transition of Care [facility]) CM/SW Contact:    Bethena Roys, RN Phone Number: 06/19/2021, 5:06 PM  Clinical Narrative: Risk for readmission assessment completed. PTA patient was from home with spouse, daughter, and daughter's husband. Case Manager spoke with daughter regarding disposition needs. Patient has a rolling walker in the home. Case Manager discussed home health services and the daughter wanted to wait to see how the patient progresses. Nephrology is following the patient for AKI on CKD stage 3a. Case Manager will continue to follow for additional disposition needs as the patient progresses.                   Expected Discharge Plan: [place]     Patient Goals and CMS Choice Patient states their goals for this hospitalization and ongoing recovery are:: to return home      Expected Discharge Plan and Services Expected Discharge Plan: [place] In-house Referral: NA Discharge Planning Services: CM Consult Post Acute Care Choice: [place] arrangements for the past 2 months: Single Family Home                   DME Agency: NA      Prior Living Arrangements/Services Living arrangements for the past 2 months: Single Family Home Lives with:: Adult Children, Spouse Patient language and need for interpreter reviewed:: Yes Do you feel safe going back to the place where you live?: Yes      Need for Family Participation in Patient Care: Yes (Comment) Care giver support system in place?: Yes (comment) Current home services: DME (Patient has a rolling walker at home.) Criminal Activity/Legal Involvement Pertinent to Current Situation/Hospitalization: No - Comment as needed   Permission Sought/Granted Permission sought to share information with : Family Supports, Case Manager     Emotional Assessment Appearance:: Appears stated age       Alcohol / Substance Use: Not Applicable Psych Involvement: No (comment)  Admission diagnosis:  Elevated troponin I level [R77.8] AKI (acute kidney injury) ([place]) [N17.9] Non-small cell carcinoma of lung, stage 4 ([place]) [C34.90] Primary malignant neoplasm of lung metastatic to other site, unspecified laterality ([place]) [C34.90] Malignant neoplasm of lung, unspecified laterality, unspecified part of lung ([place]) [C34.90] Anemia, unspecified type [D64.9] Dyspnea, unspecified type [R06.00] Patient Active Problem List   Diagnosis Date Noted   Non-small cell carcinoma of lung, stage 4 ([place]) 06/15/2021   Encounter for antineoplastic chemotherapy 06/13/2021   Deep venous embolism and thrombosis ([place]) 05/16/2021   Groin swelling 05/16/2021   Adenocarcinoma of right lung, stage 4 ([place]) 04/09/2021   Cerebral embolism with cerebral infarction 03/18/2021   Adenopathy 03/06/2021   History of cardiac arrest 02/19/2021   Status post coronary artery stent placement    Coronary artery disease involving native heart    Pulmonary nodules/lesions, multiple    Pulmonary embolus ([place]) 02/03/2021   Anemia 09/06/2015   GERD (gastroesophageal reflux disease) 07/05/2013   Arthritis 02/26/2010   Gout 11/06/2009   Diabetes mellitus, type II ([place]) 09/22/2009   Hyperlipidemia 09/22/2009   Essential hypertension, benign 09/22/2009   PCP:  Lind Covert, MD Pharmacy:   [facility] [place] [address] Phone: [phone] Fax: [phone]  [facility] [place], [place]  [place] [address] Phone: [phone] Fax: [phone]  [facility] [address] [place] [address] Phone: [phone] Fax: [place]  [place] [address] Phone: [phone] Fax: [phone]  [facility] [address], [place] - [address] [place] [address] Phone: [phone] Fax: [phone]  Readmission Risk Interventions    06/19/2021    4:57 PM  Readmission Risk Prevention Plan  Transportation Screening Complete  PCP or Specialist Appt within 3-5 Days Complete  HRI or [place] Complete  Social Work Consult for [place] Planning/Counseling Complete  Medication Review Press photographer) Complete

## 2021-06-19 NOTE — Progress Notes (Signed)
Occupational Therapy Treatment Patient Details Name: Arthur [name] MRN: [id number] DOB: 1940/10/08 Today's Date: 06/19/2021   History of present illness 81 yo male adm 5/19 with SOB. Rapid worsening of met disease with AKI. PMhx: stage 4 lung CA, type 2 DM, CAD s/p PCI, HLD, HTN, gout, GERD, prior PE   OT comments  Pt participated in OT ADL retraining session today with focus on bed mobility, functional ambulation in room/bathroom. And then to chair after 1x rest break. O2 prior to activity was 97% on RA and 85% after. Discussed breathing techniques w/ pt and O2 returned to 94-95%. Pt left sitting up in recliner chair with family/friends visiting. Pt was educated to use call bell for any needs including getting back to bed secondary to IV/lines. Pt verbalized understanding of this.    Recommendations for follow up therapy are one component of a multi-disciplinary discharge planning process, led by the attending physician.  Recommendations may be updated based on patient status, additional functional criteria and insurance authorization.    Follow Up Recommendations  Home health OT    Assistance Recommended at Discharge Intermittent Supervision/Assistance  Patient can return home with the following  A little help with walking and/or transfers;A little help with bathing/dressing/bathroom;Assistance with cooking/housework;Help with stairs or ramp for entrance   Equipment Recommendations  None recommended by OT    Recommendations for Other Services      Precautions / Restrictions Precautions Precautions: Fall;Other (comment) Precaution Comments: watch sats, left foot drop Restrictions Weight Bearing Restrictions: No       Mobility Bed Mobility Overal bed mobility: Modified Independent     General bed mobility comments: increased time and effort    Transfers Overall transfer level: Needs assistance Equipment used: Rolling walker (2 wheels) Transfers: Sit to/from Stand Sit  to Stand: Min guard     Balance Overall balance assessment: Needs assistance Sitting-balance support: No upper extremity supported Sitting balance-Leahy Scale: Good Sitting balance - Comments: static sitting without UE support   Standing balance support: Bilateral upper extremity supported Standing balance-Leahy Scale: Fair Standing balance comment: Reliant on UE support in standing     ADL either performed or assessed with clinical judgement   ADL Overall ADL's : Needs assistance/impaired     Grooming: Set up;Sitting     Upper Body Dressing : Set up;Sitting Upper Body Dressing Details (indicate cue type and reason): Pt changed gown, assist for line mgmt, otherwise, set-up only     Toilet Transfer: Min guard;Rolling walker (2 wheels);Ambulation;BSC/3in1 Armed forces technical officer Details (indicate cue type and reason): 3:1 over toilet in bathroom Toileting- Clothing Manipulation and Hygiene: Min guard;Sit to/from stand;Sitting/lateral lean Toileting - Clothing Manipulation Details (indicate cue type and reason): Use of rails on 3:1 for sit to stand     Functional mobility during ADLs: Rolling walker (2 wheels);Min guard General ADL Comments: Discussed role of OT with pt and verbally reviewed Mod I goals for toileting/transfers. Pt participated in ADL retraining session for functional mobility in room and ambulating to bathroom on/off 3:1 over toilet. Pt was 97% O2 prior to functional mobility and desat to 85% after ambulating to recliner noted. Pt educated verbally in pursed lip breathing techniques and O2 returned to 94-95%. Continue acute OT toward POC and goals.    Extremity/Trunk Assessment Upper Extremity Assessment Upper Extremity Assessment: Generalized weakness;Overall [facility] for tasks assessed   Lower Extremity Assessment Lower Extremity Assessment: Defer to PT evaluation LLE Deficits / Details: foot drop x 1 month  Vision Baseline Vision/History: 1 Wears  glasses Ability to See in Adequate Light: 0 Adequate Vision Assessment?: No apparent visual deficits          Cognition Arousal/Alertness: Awake/alert Behavior During Therapy: WFL for tasks assessed/performed Overall Cognitive Status: Within Functional Limits for tasks assessed                General Comments Family friends entering at end of session to visit with pt    Pertinent Vitals/ Pain       Pain Assessment Pain Assessment: No/denies pain  Home Living  Please refer to initial OT assessment for PLOF.   Prior Functioning/Environment   Pt reports Mod I prior to this admission with PRN family support. Please refer to initial OT eval for details on PLOF.   Frequency  Min 2X/week        Progress Toward Goals  OT Goals(current goals can now be found in the care plan section)  Progress towards OT goals: Progressing toward goals  Acute Rehab OT Goals Patient Stated Goal: Better breathing OT Goal Formulation: With patient/family Time For Goal Achievement: 07/01/21 Potential to Achieve Goals: Good  Plan Discharge plan remains appropriate    AM-PAC OT "6 Clicks" Daily Activity     Outcome Measure   Help from another person eating meals?: None Help from another person taking care of personal grooming?: A Little Help from another person toileting, which includes using toliet, bedpan, or urinal?: A Little Help from another person bathing (including washing, rinsing, drying)?: A Little Help from another person to put on and taking off regular upper body clothing?: A Little Help from another person to put on and taking off regular lower body clothing?: A Little 6 Click Score: 19    End of Session Equipment Utilized During Treatment: Gait belt;Rolling walker (2 wheels)  OT Visit Diagnosis: Unsteadiness on feet (R26.81);Other abnormalities of gait and mobility (R26.89)   Activity Tolerance Patient tolerated treatment well   Patient Left in chair;with call bell/phone  within reach;with family/visitor present   Nurse Communication Other (comment) (IV complete)        Time: 6295-2841 OT Time Calculation (min): 22 min  Charges: OT General Charges $OT Visit: 1 Visit OT Treatments $Self Care/Home Management : 8-22 mins   Martha Ellerby Beth Dixon, OTR/L 06/19/2021, 11:00 AM

## 2021-06-19 NOTE — Progress Notes (Signed)
FPTS Brief Progress Note  S:Received a call from RN who reported that patient with acute onset dyspnea requiring 2 L O2 [place], tachycardia to 125, and febrile to 101.3. [name] family report that patient has had bilateral thigh cramps all day, relieved by Tylenol. He had acute worsening of his pain around 4:30, and developed aforementioned sx when RN called around 8:15. They also report that patient has history of recurrent DVT, and this presentation is similar to previous occurrences.   Patient assessed and reported that he is breathing well on 2 L. He reports continued bilateral LE pain, chills, and subjective fever.   O: BP (!) 149/74 (BP Location: Right Arm)   Pulse (!) 125   Temp (!) 101.3 F (38.5 C) (Axillary)   Resp 19   Ht 5\' 11"  (1.803 m)   Wt 70.3 kg   SpO2 99%   BMI 21.62 kg/m    Physical exam: General: Acutely ill, diaphoretic male initially lying in bed with eyes closed, [place] in place. Cardiovascular: Tachycardic. Monitor showing missed beats. No new m/r/g. Respiratory: Diffuse wheezes bilaterally. Normal WOB on 2L [place] Extremities: Warmth of bilateral LE. No skin changes noted. Muscle tightness in distribution of tensor fasciae lata of L thigh. Nontender to palpation bilaterally.   A/P: Acute Fever Tachycardia BLE Pain  Dyspnea requiring 2L [place] Concern for PE vs. Infection vs. Volume overload 2/2 discontinuation of diuresis earlier this afternoon. CXR with no signs of pleural effusion; largely unchanged from prior. -V/Q scan to assess for PE as patient unable to tolerate contrast required of CTA PE. -Bilateral LE doppler to rule out DVT. -Urinalysis, COVID-19 swab, and blood cultures x2 obtained to assess for infection -EKG to assess cardiac function in the setting of acute tachycardia with missed beats shown on monitoring.   Remainder of management per day team.  - Orders reviewed. Labs for AM ordered, which were adjusted as needed.   Rosezetta Schlatter, MD 06/19/2021,  8:51 PM PGY-1, [place] Night Resident  Please page [phone] with questions.

## 2021-06-19 NOTE — Progress Notes (Addendum)
   06/19/21 2015  Assess: MEWS Score  Temp (!) 101.3 F (38.5 C)  BP (!) 149/74  Pulse Rate (!) 125  ECG Heart Rate (!) 124  Resp 19  Level of Consciousness Alert  SpO2 99 %  O2 Device Nasal Cannula  O2 Flow Rate (L/min) 2 L/min  Assess: MEWS Score  MEWS Temp 1  MEWS Systolic 0  MEWS Pulse 2  MEWS RR 0  MEWS LOC 0  MEWS Score 3  MEWS Score Color Yellow  Assess: if the MEWS score is Yellow or Red  Were vital signs taken at a resting state? Yes  Focused Assessment Change from prior assessment (see assessment flowsheet)  Early Detection of Sepsis Score *See Row Information* Low  MEWS guidelines implemented *See Row Information* Yes  Treat  MEWS Interventions Administered prn meds/treatments  Take Vital Signs  Increase Vital Sign Frequency  Yellow: Q 2hr X 2 then Q 4hr X 2, if remains yellow, continue Q 4hrs  Escalate  MEWS: Escalate Yellow: discuss with charge nurse/RN and consider discussing with provider and RRT  Notify: Charge Nurse/RN  Name of Charge Nurse/RN Notified Christina, RN  Date Charge Nurse/RN Notified 06/19/21  Time Charge Nurse/RN Notified 2017  Notify: Provider  Provider Name/Title Idalia Needle, MD  Date Provider Notified 06/19/21  Time Provider Notified 2015  Method of Notification Page  Notification Reason Change in status  Provider response En route  Date of Provider Response 06/19/21  Time of Provider Response 2216  Document  Patient Outcome Not stable and remains on department  Progress note created (see row info) Yes   Arrived to pt's room, he was having rigors, wheezing, and generalized pain & not feeling well. VS taken, PRN neb administered & Tylenol. Idalia Needle, DO with Family Medicine paged and in route

## 2021-06-19 NOTE — Care Management Important Message (Signed)
Important Message  Patient Details  Name: Arthur [name] MRN: [id number] Date of Birth: 1940-04-11   Medicare Important Message Given:  Yes     [name] 06/19/2021, 8:17 AM

## 2021-06-19 NOTE — Progress Notes (Addendum)
Family Medicine Teaching Service Daily Progress Note Intern Pager: [phone]  Patient name: Arthur [name] Medical record number: [id number] Date of birth: [birth year] Age: 81 y.o. Gender: male  Primary Care Provider: Lind Covert, MD Consultants: Nephrology, med onc Code Status: Full  Pt Overview and Major Events to Date:  5/19: Admitted with dyspnea, found to have profound AKI 5/20: Hemoglobin downtrending, dyspnea improved entirely 5/22: VVS-no hematoma surgical intervention; Buspar added; Lasix increased to 160 mg Q8H  Assessment and Plan:  Arthur [name] is an 81 year old male presenting with dyspnea likely multifactorial, due to progression of NSCLC. History significant for CAD s/p stent (January),  pulmonary embolism  (January), DVT on apixaban->enoxaparin, LLE hematoma requiring transfusion and IVC filter placement (April), and NSCLC (Stage IV) on alectinib   Dyspnea likely due to progression of NSCLC/pulmonary edema in setting of worsening anemia/renal function No dyspnea while in the bed but patient says when he is getting up he feels short of breath.  Denies any pain anywhere.  Hemoglobin stable at 8.4 from 8.4 yesterday.  No clinical signs of anemia given WBC within normal limits, afebrile, saturating well on room air. Lung fields with wheezes bilaterally. -Metabolic outpatient follow-up -Alecensa 600 mg twice daily -Outpatient PET scan -Nephrology following (Lasix 160 every 8h currently)  Acute renal failure in setting of CKD 3 A  Contrast induced ATN Urine output yesterday is 3.11 L, improved.  Creatinine is worsening to 7.59 from 7.47, potassium was 4.8.  Was started on [facility] per nephrology. -Nephrology following, appreciate care and recommendations -Lasix on 160 mg IV every 8 hours -Lokelma 10 g twice daily -Avoid nephrotoxic agents -Follow-up twice daily renal labs  Normocytic anemia Hemoglobin 8.4 with MCV 87.6.  Secondary from hematoma/iron  deficiency.  Iron was 19. -Ferrous sulfate -CBC  CAD s/p stent -Crestor dose reduced -Clopidogrel  Recent stroke Thought to be thromboembolic on anticoagulation due to anemia and recent significant bleeding event -Plavix -Crestor  Left foot drop  hematoma Unable to dorsiflex or plantarflex with decreased sensation.  Left upper thigh hematoma evaluated by VVS yesterday. -No surgical intervention required per VVS -PT/OT -Fall precautions  History of DVT and PE S/p IVC placement and history of bleeding on Lovenox and Eliquis -Avoid anticoagulation currently due to hematoma/anemia -SCDs  Anxiety -BuSpar 5 mg twice daily  Goals of care -Continue goals of care discussion with family  FEN/GI: Renal/Veg diet with fluid restriction PPx: SCDs Dispo: Home with home health pending renal function   Subjective:  Says he has been urinating more, denies any pain.  Says that he has dyspnea when standing up.  Objective: Temp:  [97.5 F (36.4 C)-98 F (36.7 C)] 98 F (36.7 C) (05/23 0617) Pulse Rate:  [84-87] 87 (05/23 0617) Resp:  [14-15] 14 (05/23 0617) BP: (136-145)/(65-70) 145/70 (05/23 0617) SpO2:  [95 %-98 %] 95 % (05/23 0617) Weight:  [66.7 kg-70.3 kg] 70.3 kg (05/23 0617) Physical Exam: General: NAD, alert and responsive to all questions, laying in bed comfortably Cardiovascular: RRR no murmurs rubs or gallops Respiratory: Bilateral expiratory wheezes on examination, no retractions Abdomen: Nontender to palpation, soft Extremities: No pitting edema, LLE unable to dorsiflex or plantarflex, no sensation on left foot, well perfused  Laboratory: Recent Labs  Lab 06/17/21 0444 06/18/21 0620 06/19/21 0324  WBC 7.7 6.5 6.5  HGB 9.0* 8.4* 8.4*  HCT 27.1* 25.1* 25.5*  PLT 274 260 270   Recent Labs  Lab 06/15/21 0400 06/15/21 0413 06/18/21 5102 06/18/21 1627 06/19/21 0324  NA 136   < > 136 132* 134*  K 5.2*   < > 5.3* 5.4* 4.8  CL 109   < > 104 99 105  CO2 16*    < > 21* 21* 19*  BUN 43*   < > 58* 59* 61*  CREATININE 4.73*   < > 7.33* 7.47* 7.59*  CALCIUM 8.5*   < > 8.1* 8.2* 8.0*  PROT 6.3*  --   --   --   --   BILITOT 1.4*  --   --   --   --   ALKPHOS 86  --   --   --   --   ALT 13  --   --   --   --   AST 21  --   --   --   --   GLUCOSE 89   < > 92 147* 89   < > = values in this interval not displayed.    Imaging/Diagnostic Tests: No results found.   Gerrit Heck, MD 06/19/2021, 7:03 AM PGY-1, [facility] Intern pager: [phone], text pages welcome

## 2021-06-19 NOTE — Progress Notes (Signed)
Orthopedic Tech Progress Note Patient Details:  Arthur [name] [birth year] [id number]  Called in order to [employer] for an AFO off the shelf per [facility]   Patient ID: Arthur [name], male   DOB: 04/11/1940, 81 y.o.   MRN: [id number]  Arthur [name] 06/19/2021, 7:55 AM

## 2021-06-19 NOTE — Progress Notes (Signed)
[facility] Progress Note  Assessment/ Plan: AKI on CKD 3a - b/l creat is 1.2- 1.5 from early 2023, eGFR 45- 60 ml/min. Here creat is 4.7 several days after IV contrast exposure for CT scan on 5/15. No other nephrotoxic medications, no hypotension, no acei/ ARB / nsaids. UA unremarkable, renal US w/o obstruction. Urine lytes are c/w ATN.  Pt has ATN from contrast injury. Rx is supportive care. Will dc IVF"s given SOB/ low SpO2 overnight. CXR congestion.  - Will stop the Lasix; I'm wondering if cr is starting to plateau and he's entering the diuretic phase of ATN.  - I spoke with the pt, spouse and daughter who were bedside. No uremic findings. Metabolic acidosis - due to severe AKI, a bit better but needed to dc IVF"s for now Hyperkalemia - Renal diet and will monitor K closely; stopped Lokelma.  Lung cancer - taking po chemo Hypoxia - had SOB overnight, likely vol overload from IVF's causing resp issues. CXR ordered per pmd. IVF"s dc'd. See above also.  Subjective: Pt voided several times ~42ml each time. Denies dyspnea / CP / fever/ chills/ nausea. Main complaint is back and thigh pain.   Vitals:   06/18/21 0600 06/18/21 0913 06/18/21 2032 06/19/21 0617  BP:   136/65 (!) 145/70  Pulse:   84 87  Resp:   15 14  Temp:   (!) 97.5 F (36.4 C) 98 F (36.7 C)  TempSrc:   Oral Oral  SpO2:   98% 95%  Weight: 79.8 kg 66.7 kg  70.3 kg  Height:        Exam: Gen alert, no distress No jvd or bruits Chest clear bilat to bases RRR no RG Abd soft ntnd no mass or ascites +bs Ext 1+ bilat LE edema L > R Neuro is alert, Ox 3 , nf, no asterixis    Home meds include - tylenol, alectinib, zyloprim, plavix, colchicine, voltaren gel, empagliflozin 10, ferr sulfate, norco, losartan 25, miralax, lyrica 50 bid, crestor, asper-flex cream      Date                          Creat               eGFR     2012- 2015              1.11- 1.32             2017- July 2022      1.14- 1.40         46- 60 ml/min                    Jan 2023                1.17-  1.52       46- >60      Feb 2023                1.24- 1.56        45- 59 ml/min, stage 3a      March 2023            1.15 May 2021                1.50     Jun 11, 2021  1.83     06/15/21                    4.73                 12     06/16/21                    5.31                 10 ml/min     UA 5/19 - 100 prot, rare bact, 0-5 rbc, 6-10 wbc, 0-5 epis    UNa 27, UCr 70      CXR 5/19- FINDINGS: Cardiac shadow is within normal limits. Lungs are well aerated bilaterally. No focal infiltrate or sizable effusion is noted. Old healed rib fractures on the left are noted. No acute bony abnormality is seen. IMPRESSION: No acute abnormality noted.     Renal US > 10-11 cm kidneys w/o hydro         Recent Labs  Lab 06/16/21 0141 06/16/21 1444 06/17/21 0444 06/17/21 1626 06/18/21 0620 06/18/21 1627 06/19/21 0324  HGB 8.2*   < > 9.0*  --  8.4*  --  8.4*  ALBUMIN 2.2*  --  2.4*  --   --   --   --   CALCIUM 8.3*   < > 8.0*   < > 8.1* 8.2* 8.0*  PHOS 6.7*  --  7.2*  --   --   --   --   CREATININE 5.31*   < > 6.39*   < > 7.33* 7.47* 7.59*  K 5.4*   < > 4.8   < > 5.3* 5.4* 4.8   < > = values in this interval not displayed.   Inpatient medications:  alectinib  600 mg Oral BID WC   busPIRone  5 mg Oral BID   clopidogrel  75 mg Oral Daily   ferrous sulfate  325 mg Oral Q breakfast   polyethylene glycol  17 g Oral Daily   rosuvastatin  5 mg Oral Daily   sodium zirconium cyclosilicate  10 g Oral BID     guaiFENesin, HYDROcodone-acetaminophen, ipratropium-albuterol, pregabalin

## 2021-06-19 NOTE — Progress Notes (Signed)
FPTS Brief Progress Note  S:Patient reports feeling well tonight. He denies dyspnea and reports no acute concerns or complaints. Patient calls daughter, and she is given an update on nephrology's recommendations and the plan for his care moving forward. She is appreciative of the call. Patient reports last void was 400 ml.   O: BP 136/65 (BP Location: Left Arm)   Pulse 84   Temp (!) 97.5 F (36.4 C) (Oral)   Resp 15   Ht 5\' 11"  (1.803 m)   Wt 66.7 kg   SpO2 98%   BMI 20.50 kg/m   General: NAD, lying in bed comfortably.  Respiratory: Normal WOB on RA. No audible wheezes.  A/P: AKI on CKD Stage IIIa Nephrology following, appreciate recs -HD not yet indicated, but temporary HD possible in the next 24-72 hours pending renal response to diuresis. -IV Lasix increased to 160 mg q8h. -Fluid restricted to 1200 ml daily -Monitor Cr on AM BM  Hyperkalemia K+  5.4 on PM BMP -Lokelma 10 g -Lasix increased as above. -Renal diet -Monitor on AM BMP  Remainder of management per day team.  - Orders reviewed. Labs for AM ordered, which were adjusted as needed.    Rosezetta Schlatter, MD 06/19/2021, 12:26 AM PGY-1, [facility] Family Medicine Night Resident  Please page [phone] with questions.

## 2021-06-20 ENCOUNTER — Inpatient Hospital Stay (HOSPITAL_COMMUNITY): Payer: Medicare Other

## 2021-06-20 DIAGNOSIS — L538 Other specified erythematous conditions: Secondary | ICD-10-CM | POA: Diagnosis not present

## 2021-06-20 DIAGNOSIS — R509 Fever, unspecified: Secondary | ICD-10-CM

## 2021-06-20 DIAGNOSIS — M7989 Other specified soft tissue disorders: Secondary | ICD-10-CM

## 2021-06-20 DIAGNOSIS — M79622 Pain in left upper arm: Secondary | ICD-10-CM | POA: Diagnosis not present

## 2021-06-20 LAB — URINALYSIS, ROUTINE W REFLEX MICROSCOPIC
Bacteria, UA: NONE SEEN
Bilirubin Urine: NEGATIVE
Glucose, UA: NEGATIVE mg/dL
Ketones, ur: NEGATIVE mg/dL
Leukocytes,Ua: NEGATIVE
Nitrite: NEGATIVE
Protein, ur: NEGATIVE mg/dL
Specific Gravity, Urine: 1.009 (ref 1.005–1.030)
pH: 6 (ref 5.0–8.0)

## 2021-06-20 LAB — CBC
HCT: 26.3 % — ABNORMAL LOW (ref 39.0–52.0)
Hemoglobin: 8.5 g/dL — ABNORMAL LOW (ref 13.0–17.0)
MCH: 28.5 pg (ref 26.0–34.0)
MCHC: 32.3 g/dL (ref 30.0–36.0)
MCV: 88.3 fL (ref 80.0–100.0)
Platelets: 280 10*3/uL (ref 150–400)
RBC: 2.98 MIL/uL — ABNORMAL LOW (ref 4.22–5.81)
RDW: 17.1 % — ABNORMAL HIGH (ref 11.5–15.5)
WBC: 17.2 10*3/uL — ABNORMAL HIGH (ref 4.0–10.5)
nRBC: 0.1 % (ref 0.0–0.2)

## 2021-06-20 LAB — BLOOD CULTURE ID PANEL (REFLEXED) - BCID2

## 2021-06-20 LAB — RENAL FUNCTION PANEL
Albumin: 2.3 g/dL — ABNORMAL LOW (ref 3.5–5.0)
Albumin: 2.4 g/dL — ABNORMAL LOW (ref 3.5–5.0)
Anion gap: 12 (ref 5–15)
Anion gap: 15 (ref 5–15)
BUN: 65 mg/dL — ABNORMAL HIGH (ref 8–23)
BUN: 67 mg/dL — ABNORMAL HIGH (ref 8–23)
CO2: 23 mmol/L (ref 22–32)
CO2: 20 mmol/L — ABNORMAL LOW (ref 22–32)
Calcium: 8.1 mg/dL — ABNORMAL LOW (ref 8.9–10.3)
Calcium: 7.8 mg/dL — ABNORMAL LOW (ref 8.9–10.3)
Chloride: 101 mmol/L (ref 98–111)
Chloride: 100 mmol/L (ref 98–111)
Creatinine, Ser: 7.84 mg/dL — ABNORMAL HIGH (ref 0.61–1.24)
Creatinine, Ser: 7.38 mg/dL — ABNORMAL HIGH (ref 0.61–1.24)
GFR, Estimated: 6 mL/min — ABNORMAL LOW (ref >60)
GFR, Estimated: 7 mL/min — ABNORMAL LOW (ref >60)
Glucose, Bld: 108 mg/dL — ABNORMAL HIGH (ref 70–99)
Glucose, Bld: 128 mg/dL — ABNORMAL HIGH (ref 70–99)
Phosphorus: 8.5 mg/dL — ABNORMAL HIGH (ref 2.5–4.6)
Phosphorus: 7.9 mg/dL — ABNORMAL HIGH (ref 2.5–4.6)
Potassium: 4.3 mmol/L (ref 3.5–5.1)
Potassium: 4.2 mmol/L (ref 3.5–5.1)
Sodium: 136 mmol/L (ref 135–145)
Sodium: 135 mmol/L (ref 135–145)

## 2021-06-20 IMAGING — CT CT EXTREM LOW W/O CM*L*
3 of 7 series · 9 of 33 positions shown, 10 images · non-contrast
Comparison: MRI left thigh [DATE]

CLINICAL DATA: Hematoma. Known left lower extremity deep venous
thrombosis seen on ultrasound. History of lung cancer.

EXAM:
CT OF THE LOWER LEFT EXTREMITY WITHOUT CONTRAST
TECHNIQUE: Multidetector CT imaging of the lower left extremity was performed
according to the standard protocol.
RADIATION DOSE REDUCTION: This exam was performed according to the
departmental dose-optimization program which includes automated
exposure control, adjustment of the mA and/or kV according to
patient size and/or use of iterative reconstruction technique.

[Series 7: coronalsoft tissue · coronal · 0.53mm/px · 3 of 126 slices shown]
[im 26/126  bone]
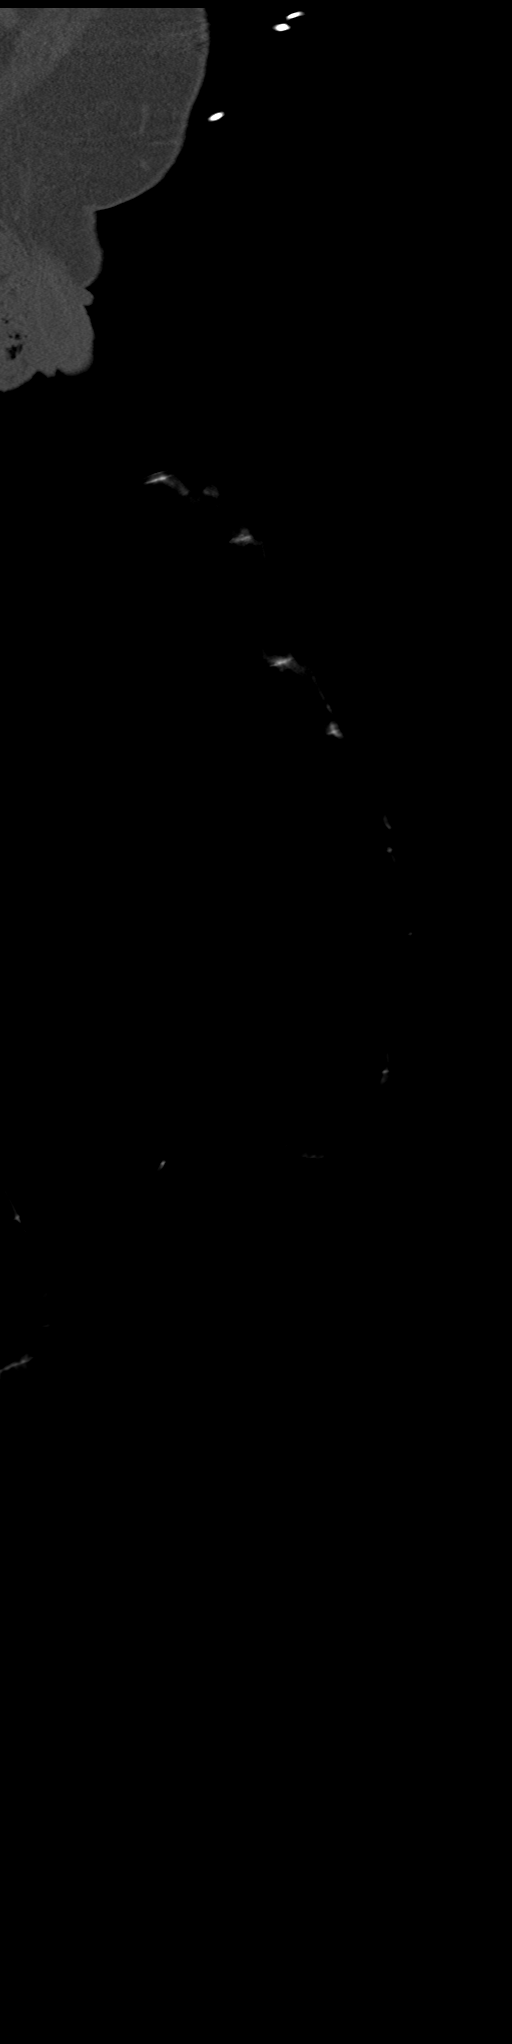
[im 51/126  bone]
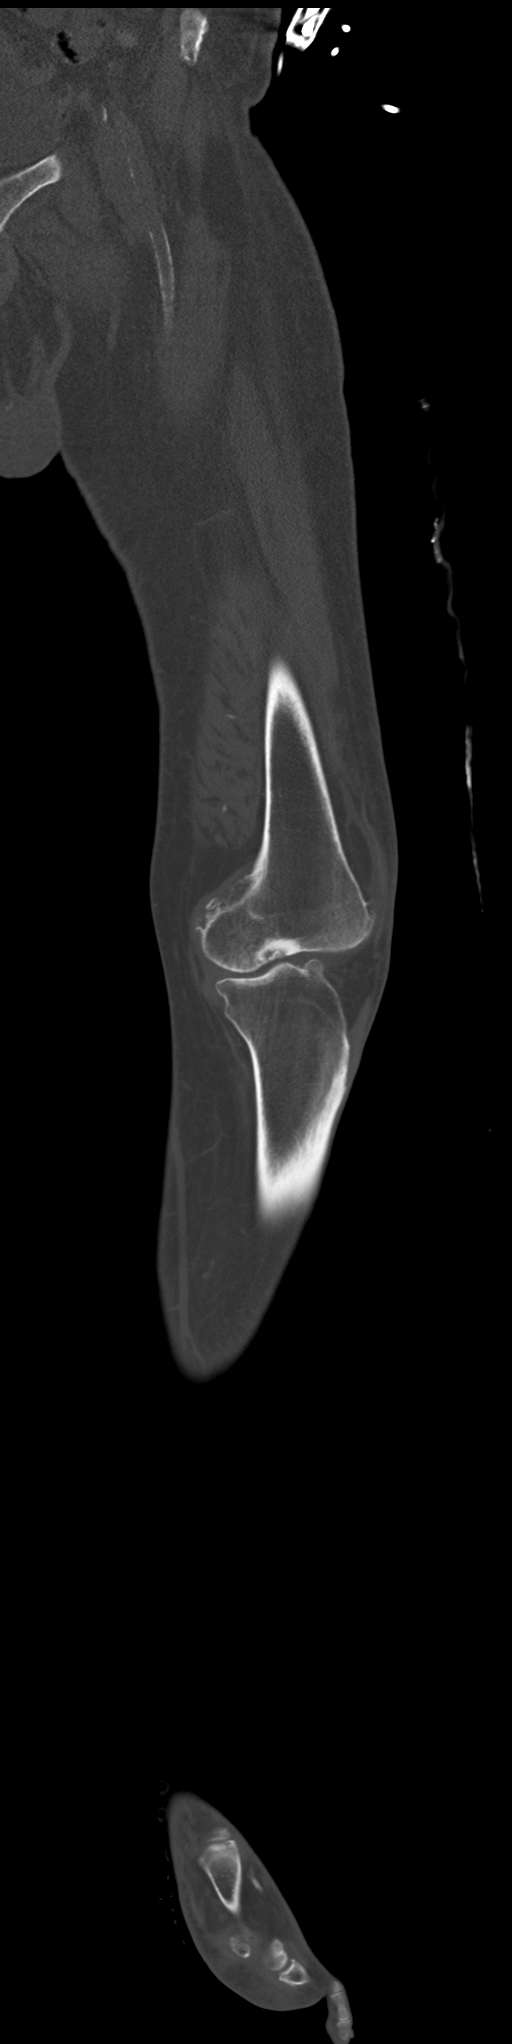
[im 76/126  bone]
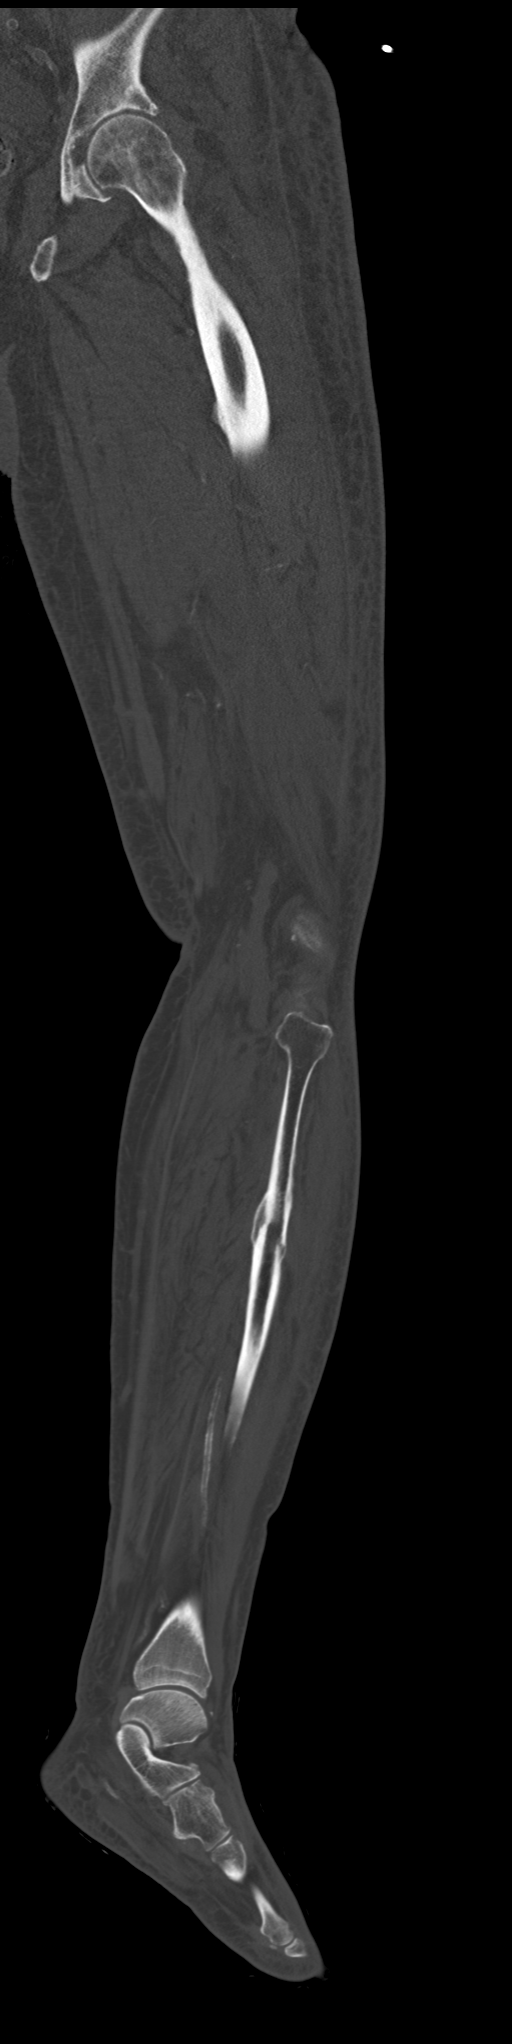

[Series 9: thins bone · axial · 0.51mm/px · z∈[-859,-321]mm · 3 of 1078 slices shown, 4 images]
[im 270/1078  soft-tissue]
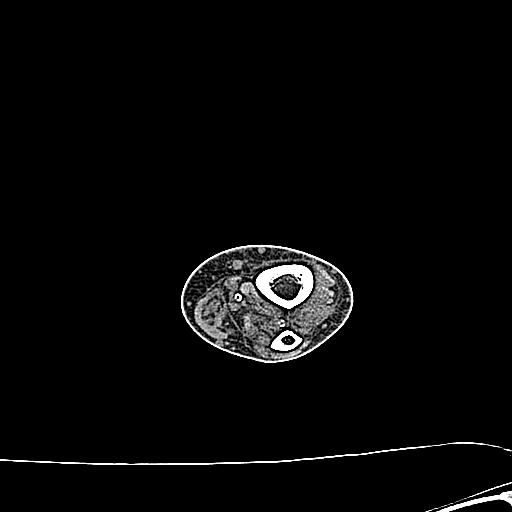
[im 270/1078  bone]
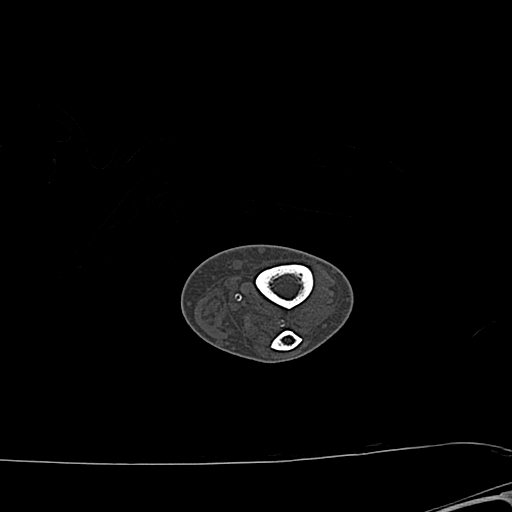
[im 539/1078  bone]
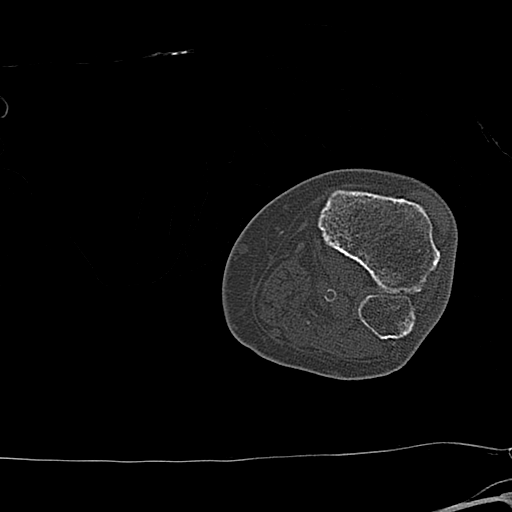
[im 808/1078  bone]
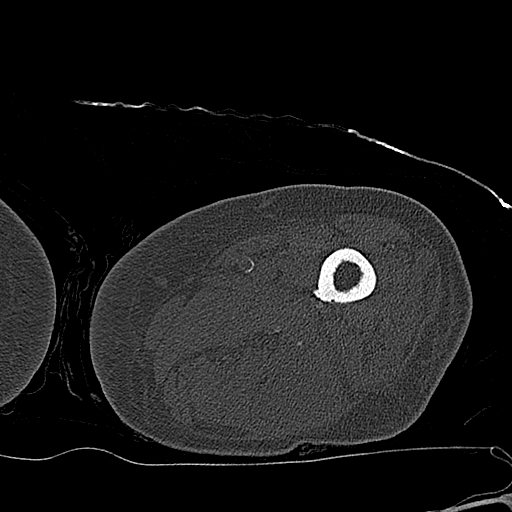

[Series 10: thins st · axial · 0.51mm/px · z∈[-857,-309]mm · 3 of 1031 slices shown]
[im 258/1031  bone]
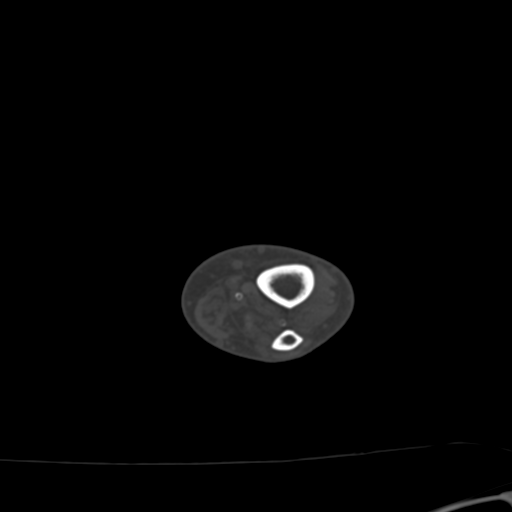
[im 516/1031  bone]
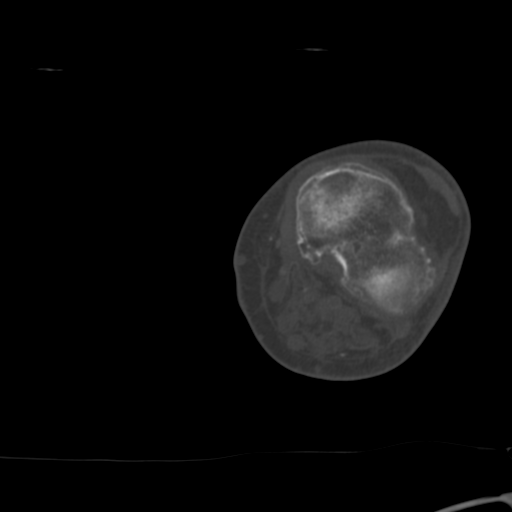
[im 773/1031  bone]
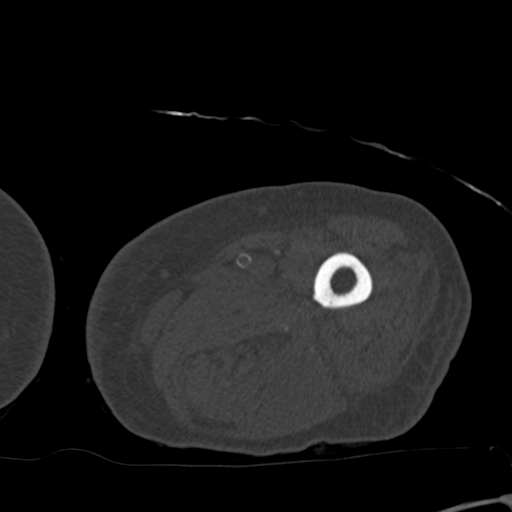

[9 of 33 positions shown; findings below may reference images not displayed]

FINDINGS: Bones/Joint/Cartilage

Old healed proximal fibular diaphyseal fracture. Severe medial and
patellofemoral compartment joint space narrowing and peripheral
osteophytosis of the left knee. Chronic well corticated ossicle
distal to the medial malleolus likely the sequela of remote trauma.
Moderate calcaneocuboid osteoarthritis. Moderate plantar calcaneal
heel spur.

No acute fracture or dislocation.

No cortical erosion is seen.

Ligaments

Suboptimally assessed by CT.

Muscles and Tendons

There is a dense fluid collection within the posterior compartment
of the mid left thigh measuring up to approximately 5.5 x 6.7 x 19
cm, similar to 4.9 x 6.5 x 23 cm on [DATE]. There is again a
small fat intensity lipoma superficial to the left proximal rectus
femoris muscle (axial series 4, image 31). There is again moderate
edema within the subcutaneous fat of the left thigh and proximal
left calf.

Soft tissues

There are dense atherosclerotic calcifications within the visualized
left lower extremity arteries. Small Baker's cyst.

Partial visualization of the prior previously seen large right
inguinal hernia containing mesenteric fat and nondistended loops of
small bowel.
IMPRESSION: 1. Stable to slightly decreased size of the previously seen left
thigh posterior compartment complex fluid collection, likely an
evolving hematoma.
2. No acute fracture. Old healed proximal left fibular diaphyseal
fracture.
3. Small Baker's cyst.

## 2021-06-20 MED ORDER — VANCOMYCIN VARIABLE DOSE PER UNSTABLE RENAL FUNCTION (PHARMACIST DOSING): Status: DC

## 2021-06-20 MED ORDER — POLYETHYLENE GLYCOL 3350 17 G PO PACK: 17.0000 g | PACK | Freq: Every day | ORAL | Status: DC | PRN

## 2021-06-20 MED ORDER — SODIUM CHLORIDE 0.9 % IV SOLN
1.0000 g | INTRAVENOUS | Status: DC
  Administered 2021-06-20: 1 g via INTRAVENOUS
  Filled 2021-06-20: qty 10

## 2021-06-20 MED ORDER — VANCOMYCIN HCL 1500 MG/300ML IV SOLN
1500.0000 mg | Freq: Once | INTRAVENOUS | Status: AC
  Administered 2021-06-20: 1500 mg via INTRAVENOUS
  Filled 2021-06-20: qty 300

## 2021-06-20 MED ORDER — CEFAZOLIN SODIUM-DEXTROSE 1-4 GM/50ML-% IV SOLN
1.0000 g | INTRAVENOUS | Status: DC
  Administered 2021-06-20 – 2021-06-23 (×4): 1 g via INTRAVENOUS
  Filled 2021-06-20 (×5): qty 50

## 2021-06-20 MED ORDER — RAMELTEON 8 MG PO TABS
8.0000 mg | ORAL_TABLET | Freq: Every day | ORAL | Status: DC
  Administered 2021-06-21 – 2021-06-24 (×4): 8 mg via ORAL
  Filled 2021-06-20 (×6): qty 1

## 2021-06-20 MED ORDER — VANCOMYCIN HCL 1250 MG/250ML IV SOLN
1250.0000 mg | Freq: Once | INTRAVENOUS | Status: DC
  Filled 2021-06-20: qty 250

## 2021-06-20 MED ORDER — SODIUM CHLORIDE 0.9 % IV BOLUS
500.0000 mL | Freq: Once | INTRAVENOUS | Status: AC
  Administered 2021-06-20: 500 mL via INTRAVENOUS

## 2021-06-20 NOTE — Progress Notes (Signed)
Occupational Therapy Treatment Patient Details Name: Arthur [name] MRN: [id number] DOB: [birth year] Today's Date: 06/20/2021   History of present illness 81 yo male adm 5/19 with SOB. Rapid worsening of met disease with AKI. PMhx: stage 4 lung CA, type 2 DM, CAD s/p PCI, HLD, HTN, gout, GERD, prior PE   OT comments  Patient received in supine and had complaints of BLE pain. Nursing states that patient was up earlier in day. Patient agreed to address energy conservation and UE HEP. Patient provided handout for energy conservation strategies and reviewed with patient. Patient provided HEP for UE strengthening and performed RUE strengthening exercises with level one therapy with min verbal cues. Patient did not use LUE for strengthening due to complaints of pain at old IV site. Acute OT to continue to follow.    Recommendations for follow up therapy are one component of a multi-disciplinary discharge planning process, led by the attending physician.  Recommendations may be updated based on patient status, additional functional criteria and insurance authorization.    Follow Up Recommendations  Home health OT    Assistance Recommended at Discharge Intermittent Supervision/Assistance  Patient can return home with the following  A little help with walking and/or transfers;A little help with bathing/dressing/bathroom;Assistance with cooking/housework;Help with stairs or ramp for entrance   Equipment Recommendations  None recommended by OT    Recommendations for Other Services      Precautions / Restrictions Precautions Precautions: Fall;Other (comment) Precaution Comments: watch sats, left foot drop Restrictions Weight Bearing Restrictions: No       Mobility Bed Mobility               General bed mobility comments: declined getting out of bed    Transfers                   General transfer comment: declined getting out of bed     Balance                Standing balance comment: declined getting out of bed                           ADL either performed or assessed with clinical judgement   ADL                                              Extremity/Trunk Assessment              Vision       Perception     Praxis      Cognition Arousal/Alertness: Awake/alert Behavior During Therapy: WFL for tasks assessed/performed Overall Cognitive Status: Within Functional Limits for tasks assessed                                 General Comments: patient declined getting out of bed due to BLE pain        Exercises Exercises: General Upper Extremity General Exercises - Upper Extremity Shoulder Flexion: Strengthening, Right, 10 reps, Supine, Theraband Theraband Level (Shoulder Flexion): Level 1 (Yellow) Shoulder ABduction: Both, 10 reps, Strengthening, Supine, Theraband Theraband Level (Shoulder Abduction): Level 1 (Yellow) Elbow Flexion: Strengthening, Right, 10 reps, Supine, Theraband Theraband Level (Elbow Flexion): Level 1 (Yellow) Elbow Extension: Strengthening, Right, 10 reps, Supine, Theraband  Theraband Level (Elbow Extension): Level 1 (Yellow)    Shoulder Instructions       General Comments provided handout on energy conservation and reviewed    Pertinent Vitals/ Pain       Pain Assessment Pain Assessment: Faces Faces Pain Scale: Hurts even more Pain Location: BLEs Pain Descriptors / Indicators: Aching, Grimacing Pain Intervention(s): Limited activity within patient's tolerance, Monitored during session  Home Living                                          Prior Functioning/Environment              Frequency  Min 2X/week        Progress Toward Goals  OT Goals(current goals can now be found in the care plan section)  Progress towards OT goals: Progressing toward goals  Acute Rehab OT Goals Patient Stated Goal: get better OT Goal  Formulation: With patient/family Time For Goal Achievement: 07/01/21 Potential to Achieve Goals: Good ADL Goals Pt Will Perform Lower Body Dressing: with modified independence;sit to/from stand;sitting/lateral leans Pt Will Transfer to Toilet: with modified independence;ambulating Pt/caregiver will Perform Home Exercise Program: Increased strength;Both right and left upper extremity;With theraband;Independently;With written HEP provided Additional ADL Goal #1: Pt to verbalize at least 3 energy conservation strategies to implement during daily routine.  Plan Discharge plan remains appropriate    Co-evaluation                 AM-PAC OT "6 Clicks" Daily Activity     Outcome Measure   Help from another person eating meals?: None Help from another person taking care of personal grooming?: A Little Help from another person toileting, which includes using toliet, bedpan, or urinal?: A Little Help from another person bathing (including washing, rinsing, drying)?: A Little Help from another person to put on and taking off regular upper body clothing?: A Little Help from another person to put on and taking off regular lower body clothing?: A Little 6 Click Score: 19    End of Session    OT Visit Diagnosis: Unsteadiness on feet (R26.81);Other abnormalities of gait and mobility (R26.89)   Activity Tolerance Patient tolerated treatment well   Patient Left in bed;with call bell/phone within reach;with family/visitor present   Nurse Communication Other (comment);Patient requests pain meds (BLE pain)        Time: 2481-8590 OT Time Calculation (min): 19 min  Charges: OT General Charges $OT Visit: 1 Visit OT Treatments $Therapeutic Exercise: 8-22 mins  Lodema Hong, OTA Acute Rehabilitation Services  Pager [phone] Office [place] 06/20/2021, 2:39 PM

## 2021-06-20 NOTE — Progress Notes (Signed)
PHARMACY - PHYSICIAN COMMUNICATION CRITICAL VALUE ALERT - BLOOD CULTURE IDENTIFICATION (BCID)  Arthur [name] is an 81 y.o. male who presented to [facility] on 06/15/2021 with a chief complaint of acute shortness of breath in the setting of stage IV NSCLC and PE with IVC filter.  Assessment:  4/4 bottles staph aureus, no resistance detected.    Name of physician (or Provider) Contacted: Gerrit Heck, MD  [facility] auto-consult  Current antibiotics: vancomycin   Changes to prescribed antibiotics recommended:  Stop vancomycin Start cefazolin 1g q24h  Results for orders placed or performed during the hospital encounter of 06/15/21  Blood Culture ID Panel (Reflexed) (Collected: 06/19/2021  9:05 PM)  Result Value Ref Range   Enterococcus faecalis NOT DETECTED NOT DETECTED   Enterococcus Faecium NOT DETECTED NOT DETECTED   Listeria monocytogenes NOT DETECTED NOT DETECTED   Staphylococcus species DETECTED (A) NOT DETECTED   Staphylococcus aureus (BCID) DETECTED (A) NOT DETECTED   Staphylococcus epidermidis NOT DETECTED NOT DETECTED   Staphylococcus lugdunensis NOT DETECTED NOT DETECTED   Streptococcus species NOT DETECTED NOT DETECTED   Streptococcus agalactiae NOT DETECTED NOT DETECTED   Streptococcus pneumoniae NOT DETECTED NOT DETECTED   Streptococcus pyogenes NOT DETECTED NOT DETECTED   A.calcoaceticus-baumannii NOT DETECTED NOT DETECTED   Bacteroides fragilis NOT DETECTED NOT DETECTED   Enterobacterales NOT DETECTED NOT DETECTED   Enterobacter cloacae complex NOT DETECTED NOT DETECTED   Escherichia coli NOT DETECTED NOT DETECTED   Klebsiella aerogenes NOT DETECTED NOT DETECTED   Klebsiella oxytoca NOT DETECTED NOT DETECTED   Klebsiella pneumoniae NOT DETECTED NOT DETECTED   Proteus species NOT DETECTED NOT DETECTED   Salmonella species NOT DETECTED NOT DETECTED   Serratia marcescens NOT DETECTED NOT DETECTED   Haemophilus influenzae NOT DETECTED NOT DETECTED   Neisseria  meningitidis NOT DETECTED NOT DETECTED   Pseudomonas aeruginosa NOT DETECTED NOT DETECTED   Stenotrophomonas maltophilia NOT DETECTED NOT DETECTED   Candida albicans NOT DETECTED NOT DETECTED   Candida auris NOT DETECTED NOT DETECTED   Candida glabrata NOT DETECTED NOT DETECTED   Candida krusei NOT DETECTED NOT DETECTED   Candida parapsilosis NOT DETECTED NOT DETECTED   Candida tropicalis NOT DETECTED NOT DETECTED   Cryptococcus neoformans/gattii NOT DETECTED NOT DETECTED   Meth resistant mecA/C and MREJ NOT DETECTED NOT DETECTED    Pauletta Browns 06/20/2021  12:36 PM

## 2021-06-20 NOTE — TOC Progression Note (Signed)
Transition of Care [facility]) - Progression Note    Patient Details  Name: Arthur [name] MRN: [id number] Date of Birth: 01-Mar-1940  Transition of Care [facility]) CM/SW Contact  Graves-Bigelow, Ocie Cornfield, RN Phone Number: 06/20/2021, 1:38 PM  Clinical Narrative: Case Manager received a call from Wallace from [place] and the patient is active with Physical Therapy. Patient will need resumption orders once stable to transition home.   Expected Discharge Plan: [place]    Expected Discharge Plan and Services Expected Discharge Plan: [place] In-house Referral: NA Discharge Planning Services: CM Consult Post Acute Care Choice: [place] arrangements for the past 2 months: Single Family Home                   DME Agency: NA    Readmission Risk Interventions    06/19/2021    4:57 PM  Readmission Risk Prevention Plan  Transportation Screening Complete  PCP or Specialist Appt within 3-5 Days Complete  HRI or [place] Complete  Social Work Consult for [place] Planning/Counseling Complete  Medication Review [employer]) Complete

## 2021-06-20 NOTE — Progress Notes (Addendum)
[facility] Progress Note  Assessment/ Plan: AKI on CKD 3a - b/l creat is 1.2- 1.5 from early 2023, eGFR 45- 60 ml/min. Here creat is 4.7 several days after IV contrast exposure for CT scan on 5/15. No other nephrotoxic medications, no hypotension, no acei/ ARB / nsaids. UA unremarkable, renal US w/o obstruction. Urine lytes are c/w ATN.  Pt has ATN from contrast injury. Rx is supportive care. Will dc IVF"s given SOB/ low SpO2 overnight. CXR congestion.  - Last does of Lasix was ~6AM on 5/23; likely in the diuretic phase of ATN and would anticipate the Cr peaking soon.  - No uremic findings; no indication for RRT. -Alectinib certainly can cause ATN associated renal toxicity but usually not with this type of UOP which is why he's most likely in the recovery phase of ATN but will follow closely. Metabolic acidosis - due to severe AKI, a bit  better now. Hyperkalemia - Renal diet and will monitor K closely; stopped Lokelma. Resolved. Lung cancer - taking po chemo Hypoxia - had SOB overnight, likely vol overload from IVF's causing resp issues. CXR ordered per pmd. IVF"s dc'd. See above also.  Subjective: Pt voiding several times and had UOP 4L /24hrs. Denies dyspnea / CP / fever/ chills/ nausea.    Vitals:   06/20/21 0001 06/20/21 0012 06/20/21 0505 06/20/21 0510  BP:  (!) 89/52 (!) 82/51 (!) 86/47  Pulse: 95 95 89   Resp: _0 Temp: 99.4 F (37.4 C)  99.1 F (37.3 C)   TempSrc: Axillary  Axillary   SpO2: 98% 94% 96%   Weight:   74.5 kg   Height:        Exam: Gen alert, no distress No jvd or bruits Chest clear bilat to bases RRR no RG Abd soft ntnd no mass or ascites +bs Ext 1+ bilat LE edema L > R Neuro is alert, Ox 3 , nf, no asterixis    Home meds include - tylenol, alectinib, zyloprim, plavix, colchicine, voltaren gel, empagliflozin 10, ferr sulfate, norco, losartan 25, miralax, lyrica 50 bid, crestor, asper-flex cream      Date                           Creat               eGFR     2012- 2015              1.11- 1.32             2017- July 2022      1.14- 1.40        46- 60 ml/min                    Jan 2023                1.17-  1.52       46- >60      Feb 2023                1.24- 1.56        45- 59 ml/min, stage 3a      March 2023            1.15 May 2021  1.50     Jun 11, 2021          1.83     06/15/21                    4.73                 12     06/16/21                    5.31                 10 ml/min     UA 5/19 - 100 prot, rare bact, 0-5 rbc, 6-10 wbc, 0-5 epis    UNa 27, UCr 70      CXR 5/19- FINDINGS: Cardiac shadow is within normal limits. Lungs are well aerated bilaterally. No focal infiltrate or sizable effusion is noted. Old healed rib fractures on the left are noted. No acute bony abnormality is seen. IMPRESSION: No acute abnormality noted.     Renal US > 10-11 cm kidneys w/o hydro         Recent Labs  Lab 06/19/21 0324 06/19/21 1657 06/20/21 0619  HGB 8.4*  --  8.5*  ALBUMIN  --  2.6* 2.3*  CALCIUM 8.0* 8.4* 8.1*  PHOS  --  8.6* 8.5*  CREATININE 7.59* 7.54* 7.84*  K 4.8 4.8 4.3   Inpatient medications:  alectinib  600 mg Oral BID WC   busPIRone  5 mg Oral BID   clopidogrel  75 mg Oral Daily   ferrous sulfate  325 mg Oral Q breakfast   rosuvastatin  5 mg Oral Daily   vancomycin variable dose per unstable renal function (pharmacist dosing)   Does not apply See admin instructions    ceFEPime (MAXIPIME) IV 1 g (06/20/21 0920)   vancomycin      acetaminophen, guaiFENesin, HYDROcodone-acetaminophen, ipratropium-albuterol, polyethylene glycol, pregabalin

## 2021-06-20 NOTE — Progress Notes (Signed)
Pharmacy Antibiotic Note  Arthur [name] is a 81 y.o. male admitted on 06/15/2021 with acute shortness of breath in the setting of stage IV NSCLC and PE with IVC filter.   Overnight, patient fevered to 101.3 with a heart rate of 125 BPM. WBC up to 17.2 from 6.5. Patient has worsening AKI on CKDIII with SCr of 7.84 (baseline 1.2), no hemodialysis at this time.   Pharmacy has been consulted for vancomycin & cefepime dosing.  Plan: Give vancomycin loading dose of 1500mg  IV x1    - Following doses per pharmacy for unstable renal function Give cefepime 1g IV q24h Follow BCx, renal function, and labs/vitals  Height: 5\' 11"  (180.3 cm) Weight: 74.5 kg (164 lb 3.9 oz) IBW/kg (Calculated) : 75.3  Temp (24hrs), Avg:99.5 F (37.5 C), Min:98.3 F (36.8 C), Max:101.3 F (38.5 C)  Recent Labs  Lab 06/16/21 1618 06/16/21 1622 06/17/21 0444 06/17/21 1626 06/18/21 0620 06/18/21 1627 06/19/21 0324 06/19/21 1657 06/20/21 0619  WBC 7.2  --  7.7  --  6.5  --  6.5  --  17.2*  CREATININE  --    < > 6.39* 6.74* 7.33* 7.47* 7.59* 7.54*  --    < > = values in this interval not displayed.    Estimated Creatinine Clearance: 8.2 mL/min (A) (by C-G formula based on SCr of 7.54 mg/dL (H)).    Allergies  Allergen Reactions   Ace Inhibitors     REACTION: Cough    Antimicrobials this admission: Vancomycin 5/24>> Cefepime 5/24>>  Microbiology results: 5/23 BCx: ngtd 5/23 UA: 0-5 WBC, neg leukocytes, no bacteria  Thank you for allowing pharmacy to be a part of this patient's care.  Donald Pore 06/20/2021 7:21 AM

## 2021-06-20 NOTE — Progress Notes (Signed)
LUE venous duplex has been completed.  Preliminary findings given to [place], [place].  Results can be found under chart review under CV PROC. 06/20/2021 7:18 PM Alianah Lofton RVT, RDMS

## 2021-06-20 NOTE — Progress Notes (Signed)
PCP Visit Note  Talked with patient and his daughter in law (nurse) via phone  He is a bit discouraged but hopeful.  His daughter in law is aware of his many current illnesses and appropriately concerned.  She and I are thankful for the inpt teams care.  Hopefully his current problems will abate and,given that his cancer seems to be responding, he will be able to return home with a good QOL  Azzie Roup MD

## 2021-06-20 NOTE — Consult Note (Signed)
[facility] [facility] for Infectious Disease    Date of Admission:  06/15/2021   Total days of inpatient antibiotics 1        Reason for Consult: MSSA bacteremia    Principal Problem:   Non-small cell carcinoma of lung, stage 4 [facility])   Assessment: 81 year old male with NSCLC stage IV on alectinib admitted for dyspnea secondary to progressive malignancy/pleural edema.  Patient developed fever on 5/23 with WBC 17 K blood cultures returned positive for MSSA.  #Hospital associated MSSA bacteremia #NSCLC Stage IV #LLE hematoma and DVT -Left medial antecubital PIV placed on 5/15. Left UE IV placed 5/19 and removed 5/23.  He developed temp of 101.3 on HOD5 with wbc 17.2. Blood Cx on 5/23+ MSSA. As pt has erythema and tenderness at left antecubital PIV site suspect this is the source of bacteremia.  -He also has a HX of LLE DVT(Ultrasound on 4/7) and left leg hematoma  Recommendations:  LUE venous ultrasound Repeat blood Cx tomorrow 5/25 Follow-up TTE Follow-up CT LLE Continue cefazolin Microbiology:   Antibiotics: Vancomycin 5/24 Cefepime 5/24 Cefazolin 5/24  Cultures: Blood 5/23 2/2 MSSA    HPI: Arthur [name] is a 81 y.o. male with past medical history of dyspnea secondary to progression of NSCLC stage IV on alectinib, CAD status post stent, PE treated with Eliquis then developed on apixaban transition to Lovenox for Eliquis failurel then developed eft lower extremity hematoma with left popliteal DVT requiring transfusion IVC filter placement in April admitted for shortness of breath suspect secondary to worsening lung malignancy.  He was admitted and managed for dyspnea and AKI during hospitalization.  Developed fevers and leukocytosis blood cultures obtained.  Blood cultures returned positive for MSSA.  ID consulted.   Review of Systems: Review of Systems  All other systems reviewed and are negative.  Past Medical History:  Diagnosis Date   Diabetes mellitus  without complication ([place])    High cholesterol    Hypertension    lung ca    Normal nuclear stress test 01/29/2008   stress perfusion study apparently in 2010 in [facility] which he said was negative.    Social History   Tobacco Use   Smoking status: Never   Smokeless tobacco: Never  Substance Use Topics   Alcohol use: No   Drug use: No    Family History  Problem Relation Age of Onset   Coronary artery disease Father    Coronary artery disease Mother    Scheduled Meds:  alectinib  600 mg Oral BID WC   busPIRone  5 mg Oral BID   clopidogrel  75 mg Oral Daily   ferrous sulfate  325 mg Oral Q breakfast   rosuvastatin  5 mg Oral Daily   Continuous Infusions:   ceFAZolin (ANCEF) IV 1 g (06/20/21 1403)   PRN Meds:.acetaminophen, guaiFENesin, HYDROcodone-acetaminophen, ipratropium-albuterol, polyethylene glycol, pregabalin Allergies  Allergen Reactions   Ace Inhibitors     REACTION: Cough    OBJECTIVE: Blood pressure (!) 96/43, pulse 85, temperature 97.9 F (36.6 C), temperature source Oral, resp. rate 15, height 5\' 11"  (1.803 m), weight 74.5 kg, SpO2 98 %.  Physical Exam Constitutional:      General: He is not in acute distress.    Appearance: He is normal weight. He is not toxic-appearing.  HENT:     Head: Normocephalic and atraumatic.     Right Ear: External ear normal.     Left Ear: External ear  normal.     Nose: No congestion or rhinorrhea.     Mouth/Throat:     Mouth: Mucous membranes are moist.     Pharynx: Oropharynx is clear.  Eyes:     Extraocular Movements: Extraocular movements intact.     Conjunctiva/sclera: Conjunctivae normal.     Pupils: Pupils are equal, round, and reactive to light.  Cardiovascular:     Rate and Rhythm: Normal rate and regular rhythm.     Heart sounds: No murmur heard.   No friction rub. No gallop.  Pulmonary:     Effort: Pulmonary effort is normal.     Breath sounds: Normal breath sounds.  Abdominal:     General:  Abdomen is flat. Bowel sounds are normal.     Palpations: Abdomen is soft.  Musculoskeletal:        General: No swelling. Normal range of motion.     Cervical back: Normal range of motion and neck supple.     Comments: LUE antecubital erythema and tenderness  Skin:    General: Skin is warm and dry.  Neurological:     General: No focal deficit present.     Mental Status: He is oriented to person, place, and time.  Psychiatric:        Mood and Affect: Mood normal.    Lab Results Lab Results  Component Value Date   WBC 17.2 (H) 06/20/2021   HGB 8.5 (L) 06/20/2021   HCT 26.3 (L) 06/20/2021   MCV 88.3 06/20/2021   PLT 280 06/20/2021    Lab Results  Component Value Date   CREATININE 7.84 (H) 06/20/2021   BUN 65 (H) 06/20/2021   NA 136 06/20/2021   K 4.3 06/20/2021   CL 101 06/20/2021   CO2 23 06/20/2021    Lab Results  Component Value Date   ALT 13 06/15/2021   AST 21 06/15/2021   ALKPHOS 86 06/15/2021   BILITOT 1.4 (H) 06/15/2021       Laurice Record, [place] 06/20/2021, 3:20 PM

## 2021-06-20 NOTE — Progress Notes (Signed)
Family Medicine Teaching Service Daily Progress Note Intern Pager: [phone]  Patient name: Arthur [name] Medical record number: [id number] Date of birth: 08-Dec-1940 Age: 81 y.o. Gender: male  Primary Care Provider: Lind Covert, MD Consultants: Nephrology, med onc, VVS s/o Code Status: Full  Pt Overview and Major Events to Date:  5/19: Admitted with dyspnea, found to have profound AKI 5/20: Hemoglobin downtrending, dyspnea improved entirely 5/22: VVS-no hematoma surgical intervention; Buspar added; Lasix increased to 160 mg Q8H  Assessment and Plan:  Arthur [name] is an 81 year old male presenting with dyspnea likely multifactorial, due to progression of NSCLC. History significant for CAD s/p stent (January),  pulmonary embolism  (January), DVT on apixaban->enoxaparin, LLE hematoma requiring transfusion and IVC filter placement (April), and NSCLC (Stage IV) on alectinib   Sepsis  Last inpatient had fever to 101.3, tachycardia to 125, and was on nasal cannula although patient did not have hypoxia and more so nasal cannula added for comfort, hypotension with blood pressures in the 80s over 50s.  WBC today elevated to 17.2 from 6.5 yesterday.  UA showed moderate hemoglobin but no signs of infection.  Blood cultures obtained with no growth less than 12 hours.  Chest x-ray was stable without complete consolidation.  Tachycardia present on EKG obtained last night.  Negative respiratory panel.  Patient had soft systolic murmur on examination, no evidence of pericarditis on EKG. Wheezes present bilaterally on lung examination. Feels like his breathing is better. No sacral ulcers or ulcers on lower extremities but left upper thigh warm to touch and taut.  Left upper extremity near her old IV site with long left mass present that is very tender to touch and patient has ice pack over it.  Nontender to palpation in abdomen, no history of diarrhea and no sore throat/sick symptoms.  Family  said he was feeling cold last night and turn the heat all the way up in his room and was wearing multiple blankets and then had the fever. Gram positive gram cocci. -Follow-up blood cultures sensitivities -Consider repeat echocardiogram -500 cc bolus -Keep MAPS greater than 60 -Vancomycin -Cefepime d/c -CT lower extremities  Dyspnea likely due to progression of NSCLC/pulmonary edema in setting of worsening anemia/renal function CXR unchanged, had intermittent oxygen use last night however no documented hypoxia and more for comfort. -Monitor respiratory status -Keep O2 sats greater than 92% -DuoNeb every 4 hours as needed -Mucinex  Acute renal failure in setting of CKD 3 A  Contrast induced ATN UOP 4.4 L overnight.  UA showed moderate hemoglobin.  Creatinine worsening to 7.84 from 7.54. Potassium within normal limits, phosphorus 8.5, GFR 6.  She stopped Lasix yesterday in diuretic phase of ATN -Nephrology following, appreciate recommendations - 500 cc bolus now  Normocytic anemia Hemoglobin stable at 8.5. 2/2 hematoma/iron deficiency. Iron was 19 -ferrous sulfate -CBC  Hx DVT and PE -defer V/Q and DVT u/s in setting of inability to anticoagulation  GOC -monitor, discuss with family more  Chronic/stable Recent stroke-Plavix, Crestor Anxiety-buspar 15 mg BID L Foot drop-PT Hematoma-no surgical intervention by VVS  FEN/GI: renal/fluid restriction PPx: scds Dispo:pending clinical improvmeent  Subjective:  Patient overall feels okay left arm hurts  Objective: Temp:  [98.3 F (36.8 C)-101.3 F (38.5 C)] 99.1 F (37.3 C) (05/24 0505) Pulse Rate:  [89-125] 89 (05/24 0505) Resp:  [12-19] 13 (05/24 0505) BP: (82-149)/(47-74) 86/47 (05/24 0510) SpO2:  [94 %-99 %] 96 % (05/24 0505) Weight:  [74.5 kg] 74.5 kg (05/24 0505) Physical Exam:  General: NAD, alert and oriented Cardiovascular: RRR soft systolic murmur, no rubs or gallops Respiratory: wheezes bilaterally Abdomen:  Nontender, soft Extremities: L thigh warm and taut, no pitting edema  Laboratory: Recent Labs  Lab 06/18/21 0620 06/19/21 0324 06/20/21 0619  WBC 6.5 6.5 17.2*  HGB 8.4* 8.4* 8.5*  HCT 25.1* 25.5* 26.3*  PLT 260 270 280   Recent Labs  Lab 06/15/21 0400 06/15/21 0413 06/19/21 0324 06/19/21 1657 06/20/21 0619  NA 136   < > 134* 134* 136  K 5.2*   < > 4.8 4.8 4.3  CL 109   < > 105 97* 101  CO2 16*   < > 19* 23 23  BUN 43*   < > 61* 65* 65*  CREATININE 4.73*   < > 7.59* 7.54* 7.84*  CALCIUM 8.5*   < > 8.0* 8.4* 8.1*  PROT 6.3*  --   --   --   --   BILITOT 1.4*  --   --   --   --   ALKPHOS 86  --   --   --   --   ALT 13  --   --   --   --   AST 21  --   --   --   --   GLUCOSE 89   < > 89 141* 108*   < > = values in this interval not displayed.    Imaging/Diagnostic Tests: DG CHEST PORT 1 VIEW  Result Date: 06/19/2021 CLINICAL DATA:  Shortness of breath and fever. EXAM: PORTABLE CHEST 1 VIEW COMPARISON:  Jun 17, 2021 FINDINGS: The cardiac silhouette is mildly enlarged and unchanged in size. Mild, stable diffusely increased interstitial lung markings are seen. Mild linear scarring and/or atelectasis is seen within the left lung base. There is no evidence of a pleural effusion or pneumothorax. Multiple chronic left-sided rib fractures are seen. IMPRESSION: 1. Stable exam without acute or active cardiopulmonary disease. Electronically Signed   By: Virgina Norfolk M.D.   On: 06/19/2021 20:38     Gerrit Heck, MD 06/20/2021, 8:34 AM PGY-1, [place] Intern pager: [phone], text pages welcome

## 2021-06-20 NOTE — Plan of Care (Addendum)
On reassessment, patient with BP 88/47, MAP 60, and HR 120s-130s. Patient initially sleeping comfortably. He reports no symptom and returns to sleeping comfortably. Physical exam with diffuse wheezes and snoring but normal WOB on 2L [place].   Plan:  -Will keep a close eye on vitals throughout the night. If MAPs continue to drop <60, will consider 500 cc fluid bolus then  PCCM consult for recommendations or to inquire about pressor support.  -Obtain V/Q Scan and LE Dopplers in the AM as unavailable overnight.  Rosezetta Schlatter, MD Family Medicine/ Psych Intern PGY-1 06/20/2021 1:02 AM Pager: [phone]

## 2021-06-21 ENCOUNTER — Inpatient Hospital Stay (HOSPITAL_COMMUNITY): Payer: Medicare Other

## 2021-06-21 DIAGNOSIS — R7881 Bacteremia: Secondary | ICD-10-CM

## 2021-06-21 DIAGNOSIS — B9561 Methicillin susceptible Staphylococcus aureus infection as the cause of diseases classified elsewhere: Secondary | ICD-10-CM

## 2021-06-21 DIAGNOSIS — I808 Phlebitis and thrombophlebitis of other sites: Secondary | ICD-10-CM

## 2021-06-21 DIAGNOSIS — R011 Cardiac murmur, unspecified: Secondary | ICD-10-CM | POA: Diagnosis not present

## 2021-06-21 LAB — RENAL FUNCTION PANEL
Albumin: 2.2 g/dL — ABNORMAL LOW (ref 3.5–5.0)
Albumin: 2.2 g/dL — ABNORMAL LOW (ref 3.5–5.0)
Anion gap: 15 (ref 5–15)
Anion gap: 14 (ref 5–15)
BUN: 68 mg/dL — ABNORMAL HIGH (ref 8–23)
BUN: 67 mg/dL — ABNORMAL HIGH (ref 8–23)
CO2: 21 mmol/L — ABNORMAL LOW (ref 22–32)
CO2: 24 mmol/L (ref 22–32)
Calcium: 8.1 mg/dL — ABNORMAL LOW (ref 8.9–10.3)
Calcium: 8 mg/dL — ABNORMAL LOW (ref 8.9–10.3)
Chloride: 98 mmol/L (ref 98–111)
Chloride: 97 mmol/L — ABNORMAL LOW (ref 98–111)
Creatinine, Ser: 7.4 mg/dL — ABNORMAL HIGH (ref 0.61–1.24)
Creatinine, Ser: 6.78 mg/dL — ABNORMAL HIGH (ref 0.61–1.24)
GFR, Estimated: 7 mL/min — ABNORMAL LOW (ref >60)
GFR, Estimated: 8 mL/min — ABNORMAL LOW (ref >60)
Glucose, Bld: 96 mg/dL (ref 70–99)
Glucose, Bld: 191 mg/dL — ABNORMAL HIGH (ref 70–99)
Phosphorus: 8.2 mg/dL — ABNORMAL HIGH (ref 2.5–4.6)
Phosphorus: 7.6 mg/dL — ABNORMAL HIGH (ref 2.5–4.6)
Potassium: 3.2 mmol/L — ABNORMAL LOW (ref 3.5–5.1)
Potassium: 3 mmol/L — ABNORMAL LOW (ref 3.5–5.1)
Sodium: 134 mmol/L — ABNORMAL LOW (ref 135–145)
Sodium: 135 mmol/L (ref 135–145)

## 2021-06-21 LAB — CBC
HCT: 26.2 % — ABNORMAL LOW (ref 39.0–52.0)
Hemoglobin: 8.7 g/dL — ABNORMAL LOW (ref 13.0–17.0)
MCH: 29 pg (ref 26.0–34.0)
MCHC: 33.2 g/dL (ref 30.0–36.0)
MCV: 87.3 fL (ref 80.0–100.0)
Platelets: 283 10*3/uL (ref 150–400)
RBC: 3 MIL/uL — ABNORMAL LOW (ref 4.22–5.81)
RDW: 17.1 % — ABNORMAL HIGH (ref 11.5–15.5)
WBC: 13.1 10*3/uL — ABNORMAL HIGH (ref 4.0–10.5)
nRBC: 0 % (ref 0.0–0.2)

## 2021-06-21 LAB — ECHOCARDIOGRAM COMPLETE
AR max vel: 2.25 cm2
AV Area VTI: 2.46 cm2
AV Area mean vel: 2.35 cm2
AV Mean grad: 8.8 mmHg
AV Peak grad: 16.2 mmHg
Ao pk vel: 2.01 m/s
Area-P 1/2: 3.48 cm2
Calc EF: 70.2 %
Height: 71 in
S' Lateral: 3.1 cm
Single Plane A2C EF: 77.1 %
Single Plane A4C EF: 63.1 %
Weight: 2631.41 oz

## 2021-06-21 MED ORDER — POTASSIUM CHLORIDE CRYS ER 20 MEQ PO TBCR
40.0000 meq | EXTENDED_RELEASE_TABLET | Freq: Two times a day (BID) | ORAL | Status: AC
  Administered 2021-06-21 – 2021-06-22 (×3): 40 meq via ORAL
  Filled 2021-06-21 (×3): qty 2

## 2021-06-21 MED ORDER — POTASSIUM CHLORIDE CRYS ER 20 MEQ PO TBCR
40.0000 meq | EXTENDED_RELEASE_TABLET | Freq: Once | ORAL | Status: AC
  Administered 2021-06-21: 40 meq via ORAL
  Filled 2021-06-21: qty 2

## 2021-06-21 MED ORDER — CHEWING GUM (ORBIT) SUGAR FREE
1.0000 | CHEWING_GUM | Freq: Every day | ORAL | Status: DC
  Administered 2021-06-22 – 2021-06-25 (×3): 1 via ORAL
  Filled 2021-06-21: qty 1

## 2021-06-21 NOTE — Progress Notes (Signed)
Family Medicine Teaching Service Daily Progress Note Intern Pager: [phone]  Patient name: Arthur [name] Medical record number: [id number] Date of birth: 08-Jan-1941 Age: 81 y.o. Gender: male  Primary Care Provider: Lind Covert, MD Consultants: Nephro, VVS, med onc Code Status: Full  Pt Overview and Major Events to Date:  5/19: Admitted with dyspnea, found to have profound AKI 5/20: Hemoglobin downtrending, dyspnea improved entirely 5/22: VVS-no hematoma surgical intervention; Buspar added; Lasix increased to 160 mg Q8H  Assessment and Plan:  [name] is an 81 year old male presenting with dyspnea likely multifactorial, due to progression of NSCLC. History significant for CAD s/p stent (January),  pulmonary embolism  (January), DVT on apixaban->enoxaparin, LLE hematoma requiring transfusion and IVC filter placement (April), and NSCLC (Stage IV) on alectinib    MSSA Bacteremia No longer febrile Some intermittent hypotension. Alert and responsive to all questions. Likely PIV on left side source of infection, tender to palaption, swelling reduced, somewhat warm and erythematous. U/s upper ext ordered by ID showed superficial vein thrombosis. Right thigh warm and taut but not painful. No murmur on exam today.  WBC 13.1 from 17.2 yesterday.  Blood cultures on 5/23 showed Staph aureus. repeat blood cultures in progress. -monitor vitals -ID following, appreciate recs -cefazolin 1g q24 hours -Echo, consider TEE after  Dyspnea likely due to progression of NSCLC/pulmonary edema in setting of worsening anemia/renal function-resolved  Saturating well on room air. Denies any SOB today and no hypoxia overnight -monitor respiratory status -O2 sats keep greater than 92% -DuoNeb every 4 hours as needed -Mucinex  Acute renal failure in setting of CKD 3 A  Contrast induced ATN Creatinine 7.40. K 3.2.  Nephrology stat today, likely still in diuretic phase of ATN.  UOP was 2.25 L  yesterday.  K 3.2 today.  Alecitinib can cause ATN however generally this type per nephrology. -Monitor creatinine trend, should decrease in the next 24 to 48 hours -Nephrology giving Klor-Con due to K losses -Nephrology following, appreciate recommendations  Normocytic anemia Hemoglobin stable at 8.7 from 8.5 yesterday.  Likely secondary to hematoma and iron deficiency.  Iron was 19 during this admission. -Continue ferrous sulfate -Monitor on CBC  Hx DVT and PE Known bilateral DVTs and previous scans. -Defer V/Q and DVT ultrasound in setting of inability to anticoagulate  Goals of Care Family not interested in palliative at the moment, patient is in good spirits. -Monitor  Chronic/stable Recent stroke-plavix and crestor Anxiety-buspar 5 mg BID L foot drop-PT Hematoma-resolving on CT scan, no pain-no surgicla inervention per VVS  FEN/GI: renal diet with fluid restriction  PPx: SCDs Dispo: Home w HH pending PT  Subjective:  Denies any shortness of breath, says that his left arm PIV's but does hurt still some but getting better  Objective: Temp:  [97.9 F (36.6 C)-99.6 F (37.6 C)] 98.2 F (36.8 C) (05/25 0445) Pulse Rate:  [81-98] 81 (05/25 0445) Resp:  [13-18] 16 (05/25 0445) BP: (92-128)/(43-68) 112/55 (05/25 0445) SpO2:  [96 %-99 %] 99 % (05/25 0442) Weight:  [74.6 kg] 74.6 kg (05/25 0442) Physical Exam: General: NAD, laying in bed comfortably, alert and responsive to all questions Cardiovascular: RRR no murmurs rubs or gallops Respiratory: Clear to auscultation bilaterally no wheezes rales or crackles Abdomen: Nontender to palpation, soft Extremities: Left thigh taut and warm but nontender to palpation, no pitting edema  Laboratory: Recent Labs  Lab 06/19/21 0324 06/20/21 0619 06/21/21 0626  WBC 6.5 17.2* 13.1*  HGB 8.4* 8.5* 8.7*  HCT  25.5* 26.3* 26.2*  PLT 270 280 283   Recent Labs  Lab 06/15/21 0400 06/15/21 0413 06/19/21 1657 06/20/21 0619  06/20/21 1809  NA 136   < > 134* 136 135  K 5.2*   < > 4.8 4.3 4.2  CL 109   < > 97* 101 100  CO2 16*   < > 23 23 20*  BUN 43*   < > 65* 65* 67*  CREATININE 4.73*   < > 7.54* 7.84* 7.38*  CALCIUM 8.5*   < > 8.4* 8.1* 7.8*  PROT 6.3*  --   --   --   --   BILITOT 1.4*  --   --   --   --   ALKPHOS 86  --   --   --   --   ALT 13  --   --   --   --   AST 21  --   --   --   --   GLUCOSE 89   < > 141* 108* 128*   < > = values in this interval not displayed.    Imaging/Diagnostic Tests: CT EXTREMITY LOWER LEFT WO CONTRAST  Result Date: 06/20/2021 CLINICAL DATA:  Hematoma. Known left lower extremity deep venous thrombosis seen on ultrasound. History of lung cancer. EXAM: CT OF THE LOWER LEFT EXTREMITY WITHOUT CONTRAST TECHNIQUE: Multidetector CT imaging of the lower left extremity was performed according to the standard protocol. RADIATION DOSE REDUCTION: This exam was performed according to the departmental dose-optimization program which includes automated exposure control, adjustment of the mA and/or kV according to patient size and/or use of iterative reconstruction technique. COMPARISON:  MRI left thigh 06/15/2021 FINDINGS: Bones/Joint/Cartilage Old healed proximal fibular diaphyseal fracture. Severe medial and patellofemoral compartment joint space narrowing and peripheral osteophytosis of the left knee. Chronic well corticated ossicle distal to the medial malleolus likely the sequela of remote trauma. Moderate calcaneocuboid osteoarthritis. Moderate plantar calcaneal heel spur. No acute fracture or dislocation. No cortical erosion is seen. Ligaments Suboptimally assessed by CT. Muscles and Tendons There is a dense fluid collection within the posterior compartment of the mid left thigh measuring up to approximately 5.5 x 6.7 x 19 cm, similar to 4.9 x 6.5 x 23 cm on 06/15/2021. There is again a small fat intensity lipoma superficial to the left proximal rectus femoris muscle (axial series 4, image  31). There is again moderate edema within the subcutaneous fat of the left thigh and proximal left calf. Soft tissues There are dense atherosclerotic calcifications within the visualized left lower extremity arteries. Small Baker's cyst. Partial visualization of the prior previously seen large right inguinal hernia containing mesenteric fat and nondistended loops of small bowel. IMPRESSION: 1. Stable to slightly decreased size of the previously seen left thigh posterior compartment complex fluid collection, likely an evolving hematoma. 2. No acute fracture. Old healed proximal left fibular diaphyseal fracture. 3. Small Baker's cyst. Electronically Signed   By: Yvonne Kendall M.D.   On: 06/20/2021 17:07   VAS [place] UPPER EXTREMITY VENOUS DUPLEX  Result Date: 06/20/2021 UPPER VENOUS STUDY  Patient Name:  RAKEEM COLLEY [facility]  Date of Exam:   06/20/2021 Medical Rec #: [id number]          Accession #:    [id number] Date of Birth: July 10, 1940           Patient Gender: M Patient Age:   33 years Exam Location:  [facility] Procedure:      VAS [place]  UPPER EXTREMITY VENOUS DUPLEX Referring Phys: [facility] --------------------------------------------------------------------------------  Indications: Pain, Swelling, and Erythema Other Indications: Recent IV placement to LUE. Risk Factors: Cancer patient (stage 4 NSCLC) PE (01/2021) DVT BLE (02/2021) S/P IVC filter. Comparison Study: No previous exams Performing Technologist: Jody Hill RVT, RDMS  Examination Guidelines: A complete evaluation includes B-mode imaging, spectral Doppler, color Doppler, and power Doppler as needed of all accessible portions of each vessel. Bilateral testing is considered an integral part of a complete examination. Limited examinations for reoccurring indications may be performed as noted.  Right Findings: +----------+------------+---------+-----------+----------+--------------------+ RIGHT     CompressiblePhasicitySpontaneousProperties       Summary        +----------+------------+---------+-----------+----------+--------------------+ Subclavian               Yes       Yes                   patent by                                                              color/doppler     +----------+------------+---------+-----------+----------+--------------------+  Left Findings: +----------+------------+---------+-----------+----------+-------+ LEFT      CompressiblePhasicitySpontaneousPropertiesSummary +----------+------------+---------+-----------+----------+-------+ IJV           Full       Yes       Yes                      +----------+------------+---------+-----------+----------+-------+ Subclavian    Full       Yes       Yes                      +----------+------------+---------+-----------+----------+-------+ Axillary      Full       Yes       Yes                      +----------+------------+---------+-----------+----------+-------+ Brachial      Full       Yes       Yes                      +----------+------------+---------+-----------+----------+-------+ Radial        Full                                          +----------+------------+---------+-----------+----------+-------+ Ulnar         Full                                          +----------+------------+---------+-----------+----------+-------+ Cephalic      None       No        No                Acute  +----------+------------+---------+-----------+----------+-------+ Basilic       Full       Yes       Yes                      +----------+------------+---------+-----------+----------+-------+  Summary:  Right: No evidence of superficial vein thrombosis in the upper extremity.  Left: No evidence of deep vein thrombosis in the upper extremity. Findings consistent with acute superficial vein thrombosis involving the left cephalic vein.  *See table(s) above for measurements and observations.  Diagnosing physician:  Harold Barban MD Electronically signed by Harold Barban MD on 06/20/2021 at 8:54:45 PM.    Final      Gerrit Heck, MD 06/21/2021, 7:30 AM PGY-1, [place] Intern pager: [phone], text pages welcome

## 2021-06-21 NOTE — Progress Notes (Signed)
  Echocardiogram 2D Echocardiogram has been performed.  Arthur [name] 06/21/2021, 2:18 PM

## 2021-06-21 NOTE — Progress Notes (Signed)
FPTS Brief Progress Note  S:Patient sleeping comfortably when I entered the room.  Discussed patient with nurse, who states that he appears better, and she states that the pain medications have really been helping him.  She did state that his heart rate went down once he received the pain medication, but has intermittently been elevated.  She also stated he has not had much output overnight, only about 400 mils.   O: BP 128/68 (BP Location: Left Arm)   Pulse 98   Temp 99.6 F (37.6 C) (Oral)   Resp 18   Ht 5\' 11"  (1.803 m)   Wt 74.5 kg   SpO2 96%   BMI 22.91 kg/m     A/P: Hospital associated MSSA bacteremia Likely due to peripheral IV vs hematoma Currently on Vanc and Ancef. Vital signs stable. BP normotensive. On room air  LUE dopper completed which did not show DVT but showed acute superficial vein thrombosis involving left cephalic vein  - Orders reviewed. Labs for AM ordered, which was adjusted as needed.   Remainder of plan per day team note   Shary Key, DO 06/21/2021, 12:43 AM PGY-2, [place] Medicine Night Resident  Please page [phone] with questions.

## 2021-06-21 NOTE — Progress Notes (Signed)
[facility] for Infectious Disease  Date of Admission:  06/15/2021      Total days of antibiotics 3  Cefazolin 5/24 >> current           ASSESSMENT: Arthur [name] is a 81 y.o. male with nonsmall cell lung carcinoma, stage IV on alectinib admitted for dyspnea. Hospital day 5 developed fever and leukocytosis with blood cultures growing out MSSA.   Staph Bacteremia - seems to be associated with old PIV site in the left [facility]. U/S confirms superficial clot in the cephalic vein of his left arm. Site is still tender. No further fevers. Repeating blood cultures for clearance.  No concern for metastatic sites of infection. Will need to follow up TTE to see what that shows. In speaking with his family this presented very acutely after lab draws at the oncology office on 5/15. Continue cefazolin for treatment. Ruling out endocarditis will be helpful to further outline treatment for him.   Leukocytosis, down-trending.     PLAN: Follow up TTE Continue cefazolin  Follow up repeated blood cultures    Principal Problem:   MSSA bacteremia Active Problems:   Non-small cell carcinoma of lung, stage 4 (HCC)   Thrombophlebitis arm    alectinib  600 mg Oral BID WC   busPIRone  5 mg Oral BID   chewing gum (ORBIT) sugar free  1 Stick Oral Daily   clopidogrel  75 mg Oral Daily   ferrous sulfate  325 mg Oral Q breakfast   ramelteon  8 mg Oral QHS   rosuvastatin  5 mg Oral Daily    SUBJECTIVE: Doing ok today. No new concerns. Asking to call his family with any updates. His arm is still painful and swollen.    Review of Systems: Review of Systems  Constitutional:  Negative for chills and fever.  Gastrointestinal:  Negative for abdominal pain, diarrhea and vomiting.  Musculoskeletal:        Left antecubital pain   Skin:  Negative for itching and rash.     Allergies  Allergen Reactions   Ace Inhibitors     REACTION: Cough    OBJECTIVE: Vitals:   06/21/21 0442  06/21/21 0445 06/21/21 0806 06/21/21 1202  BP: (!) 112/55 (!) 112/55 (!) 98/51 (!) 114/44  Pulse: 81 81 86 81  Resp: 13 16 15 18   Temp: 98.2 F (36.8 C) 98.2 F (36.8 C) 98.3 F (36.8 C) 98 F (36.7 C)  TempSrc: Axillary  Oral Oral  SpO2: 99%  99% 100%  Weight: 74.6 kg     Height:       Body mass index is 22.94 kg/m.  Physical Exam Vitals reviewed.  Constitutional:      Appearance: He is well-developed.  HENT:     Mouth/Throat:     Mouth: Mucous membranes are moist.  Cardiovascular:     Rate and Rhythm: Normal rate and regular rhythm.  Pulmonary:     Effort: Pulmonary effort is normal.     Breath sounds: Normal breath sounds.  Abdominal:     General: Bowel sounds are normal.     Palpations: Abdomen is soft.  Skin:    General: Skin is warm and dry.     Comments: Still swollen, erythematous and tender to palpation at left [facility] fossa   Neurological:     Mental Status: He is alert.    Lab Results Lab Results  Component Value Date   WBC 13.1 (H)  06/21/2021   HGB 8.7 (L) 06/21/2021   HCT 26.2 (L) 06/21/2021   MCV 87.3 06/21/2021   PLT 283 06/21/2021    Lab Results  Component Value Date   CREATININE 7.40 (H) 06/21/2021   BUN 68 (H) 06/21/2021   NA 134 (L) 06/21/2021   K 3.2 (L) 06/21/2021   CL 98 06/21/2021   CO2 21 (L) 06/21/2021    Lab Results  Component Value Date   ALT 13 06/15/2021   AST 21 06/15/2021   ALKPHOS 86 06/15/2021   BILITOT 1.4 (H) 06/15/2021     Microbiology: Recent Results (from the past 240 hour(s))  Resp Panel by RT-PCR (Flu A&B, Covid) Nasopharyngeal Swab     Status: None   Collection Time: 06/15/21  4:00 AM   Specimen: Nasopharyngeal Swab; Nasopharyngeal(NP) swabs in vial transport medium  Result Value Ref Range Status   SARS Coronavirus 2 by RT PCR NEGATIVE NEGATIVE Final    Comment: (NOTE) SARS-CoV-2 target nucleic acids are NOT DETECTED.  The SARS-CoV-2 RNA is generally detectable in upper respiratory specimens during the  acute phase of infection. The lowest concentration of SARS-CoV-2 viral copies this assay can detect is 138 copies/mL. A negative result does not preclude SARS-Cov-2 infection and should not be used as the sole basis for treatment or other patient management decisions. A negative result may occur with  improper specimen collection/handling, submission of specimen other than nasopharyngeal swab, presence of viral mutation(s) within the areas targeted by this assay, and inadequate number of viral copies(<138 copies/mL). A negative result must be combined with clinical observations, patient history, and epidemiological information. The expected result is Negative.  Fact Sheet for Patients:  EntrepreneurPulse.com.au  Fact Sheet for Healthcare Providers:  IncredibleEmployment.be  This test is no t yet approved or cleared by the [place] FDA and  has been authorized for detection and/or diagnosis of SARS-CoV-2 by FDA under an Emergency Use Authorization (EUA). This EUA will remain  in effect (meaning this test can be used) for the duration of the COVID-19 declaration under Section 564(b)(1) of the Act, 21 U.S.C.section 360bbb-3(b)(1), unless the authorization is terminated  or revoked sooner.       Influenza A by PCR NEGATIVE NEGATIVE Final   Influenza B by PCR NEGATIVE NEGATIVE Final    Comment: (NOTE) The Xpert Xpress SARS-CoV-2/FLU/RSV plus assay is intended as an aid in the diagnosis of influenza from Nasopharyngeal swab specimens and should not be used as a sole basis for treatment. Nasal washings and aspirates are unacceptable for Xpert Xpress SARS-CoV-2/FLU/RSV testing.  Fact Sheet for Patients: EntrepreneurPulse.com.au  Fact Sheet for Healthcare Providers: IncredibleEmployment.be  This test is not yet approved or cleared by the [place] FDA and has been authorized for detection and/or  diagnosis of SARS-CoV-2 by FDA under an Emergency Use Authorization (EUA). This EUA will remain in effect (meaning this test can be used) for the duration of the COVID-19 declaration under Section 564(b)(1) of the Act, 21 U.S.C. section 360bbb-3(b)(1), unless the authorization is terminated or revoked.  Performed at [facility], Center [address], [place], [place] [address]   Culture, blood (Routine X 2) w Reflex to ID Panel     Status: Abnormal (Preliminary result)   Collection Time: 06/19/21  9:05 PM   Specimen: BLOOD  Result Value Ref Range Status   Specimen Description BLOOD BLOOD RIGHT ARM  Final   Special Requests   Final    BOTTLES DRAWN AEROBIC AND ANAEROBIC Blood Culture  adequate volume   Culture  Setup Time   Final    GRAM POSITIVE COCCI IN CLUSTERS IN BOTH AEROBIC AND ANAEROBIC BOTTLES CRITICAL RESULT CALLED TO, READ BACK BY AND VERIFIED WITH: PHARMD RACHEL S. [phone] [id number] FCP    Culture (A)  Final    STAPHYLOCOCCUS AUREUS SUSCEPTIBILITIES TO FOLLOW Performed at [facility], [place] [address], [place], [place] [address]    Report Status PENDING  Incomplete  Blood Culture ID Panel (Reflexed)     Status: Abnormal   Collection Time: 06/19/21  9:05 PM  Result Value Ref Range Status   Enterococcus faecalis NOT DETECTED NOT DETECTED Final   Enterococcus Faecium NOT DETECTED NOT DETECTED Final   Listeria monocytogenes NOT DETECTED NOT DETECTED Final   Staphylococcus species DETECTED (A) NOT DETECTED Final    Comment: CRITICAL RESULT CALLED TO, READ BACK BY AND VERIFIED WITH: PHARMD RACHEL S. [phone] [id number] FCP    Staphylococcus aureus (BCID) DETECTED (A) NOT DETECTED Final    Comment: CRITICAL RESULT CALLED TO, READ BACK BY AND VERIFIED WITH: PHARMD RACHEL S. [phone] [id number] FCP    Staphylococcus epidermidis NOT DETECTED NOT DETECTED Final   Staphylococcus lugdunensis NOT DETECTED NOT DETECTED Final   Streptococcus species NOT DETECTED NOT DETECTED Final    Streptococcus agalactiae NOT DETECTED NOT DETECTED Final   Streptococcus pneumoniae NOT DETECTED NOT DETECTED Final   Streptococcus pyogenes NOT DETECTED NOT DETECTED Final   A.calcoaceticus-baumannii NOT DETECTED NOT DETECTED Final   Bacteroides fragilis NOT DETECTED NOT DETECTED Final   Enterobacterales NOT DETECTED NOT DETECTED Final   Enterobacter cloacae complex NOT DETECTED NOT DETECTED Final   Escherichia coli NOT DETECTED NOT DETECTED Final   Klebsiella aerogenes NOT DETECTED NOT DETECTED Final   Klebsiella oxytoca NOT DETECTED NOT DETECTED Final   Klebsiella pneumoniae NOT DETECTED NOT DETECTED Final   Proteus species NOT DETECTED NOT DETECTED Final   Salmonella species NOT DETECTED NOT DETECTED Final   Serratia marcescens NOT DETECTED NOT DETECTED Final   Haemophilus influenzae NOT DETECTED NOT DETECTED Final   Neisseria meningitidis NOT DETECTED NOT DETECTED Final   Pseudomonas aeruginosa NOT DETECTED NOT DETECTED Final   Stenotrophomonas maltophilia NOT DETECTED NOT DETECTED Final   Candida albicans NOT DETECTED NOT DETECTED Final   Candida auris NOT DETECTED NOT DETECTED Final   Candida glabrata NOT DETECTED NOT DETECTED Final   Candida krusei NOT DETECTED NOT DETECTED Final   Candida parapsilosis NOT DETECTED NOT DETECTED Final   Candida tropicalis NOT DETECTED NOT DETECTED Final   Cryptococcus neoformans/gattii NOT DETECTED NOT DETECTED Final   Meth resistant mecA/C and MREJ NOT DETECTED NOT DETECTED Final    Comment: Performed at [facility], [address], [place], [place] [address]  Culture, blood (Routine X 2) w Reflex to ID Panel     Status: Abnormal (Preliminary result)   Collection Time: 06/19/21  9:22 PM   Specimen: BLOOD  Result Value Ref Range Status   Specimen Description BLOOD BLOOD RIGHT HAND  Final   Special Requests   Final    BOTTLES DRAWN AEROBIC AND ANAEROBIC Blood Culture adequate volume   Culture  Setup Time   Final    GRAM POSITIVE COCCI  IN CLUSTERS IN BOTH AEROBIC AND ANAEROBIC BOTTLES CRITICAL VALUE NOTED.  VALUE IS CONSISTENT WITH PREVIOUSLY REPORTED AND CALLED VALUE. Performed at [facility], [place] [address], [place], [place] [address]    Culture STAPHYLOCOCCUS AUREUS (A)  Final   Report  Status PENDING  Incomplete  Resp Panel by RT-PCR (Flu A&B, Covid) Nasopharyngeal Swab     Status: None   Collection Time: 06/19/21  9:26 PM   Specimen: Nasopharyngeal Swab; Nasopharyngeal(NP) swabs in vial transport medium  Result Value Ref Range Status   SARS Coronavirus 2 by RT PCR NEGATIVE NEGATIVE Final    Comment: (NOTE) SARS-CoV-2 target nucleic acids are NOT DETECTED.  The SARS-CoV-2 RNA is generally detectable in upper respiratory specimens during the acute phase of infection. The lowest concentration of SARS-CoV-2 viral copies this assay can detect is 138 copies/mL. A negative result does not preclude SARS-Cov-2 infection and should not be used as the sole basis for treatment or other patient management decisions. A negative result may occur with  improper specimen collection/handling, submission of specimen other than nasopharyngeal swab, presence of viral mutation(s) within the areas targeted by this assay, and inadequate number of viral copies(<138 copies/mL). A negative result must be combined with clinical observations, patient history, and epidemiological information. The expected result is Negative.  Fact Sheet for Patients:  EntrepreneurPulse.com.au  Fact Sheet for Healthcare Providers:  IncredibleEmployment.be  This test is no t yet approved or cleared by the [place] FDA and  has been authorized for detection and/or diagnosis of SARS-CoV-2 by FDA under an Emergency Use Authorization (EUA). This EUA will remain  in effect (meaning this test can be used) for the duration of the COVID-19 declaration under Section 564(b)(1) of the Act, 21 U.S.C.section  360bbb-3(b)(1), unless the authorization is terminated  or revoked sooner.       Influenza A by PCR NEGATIVE NEGATIVE Final   Influenza B by PCR NEGATIVE NEGATIVE Final    Comment: (NOTE) The Xpert Xpress SARS-CoV-2/FLU/RSV plus assay is intended as an aid in the diagnosis of influenza from Nasopharyngeal swab specimens and should not be used as a sole basis for treatment. Nasal washings and aspirates are unacceptable for Xpert Xpress SARS-CoV-2/FLU/RSV testing.  Fact Sheet for Patients: EntrepreneurPulse.com.au  Fact Sheet for Healthcare Providers: IncredibleEmployment.be  This test is not yet approved or cleared by the [place] FDA and has been authorized for detection and/or diagnosis of SARS-CoV-2 by FDA under an Emergency Use Authorization (EUA). This EUA will remain in effect (meaning this test can be used) for the duration of the COVID-19 declaration under Section 564(b)(1) of the Act, 21 U.S.C. section 360bbb-3(b)(1), unless the authorization is terminated or revoked.  Performed at [facility], [place] [address] [place], [place] [address]      Janene Madeira, MSN, NP-C [facility] [email or web address] Pager: [phone] Office: [phone] [facility] Main Line: [place] Communication Welcome

## 2021-06-21 NOTE — Progress Notes (Addendum)
[facility] Progress Note  Assessment/ Plan: AKI on CKD 3a - b/l creat is 1.2- 1.5 from early 2023, eGFR 45- 60 ml/min. Here creat is 4.7 several days after IV contrast exposure for CT scan on 5/15. No other nephrotoxic medications, no hypotension, no acei/ ARB / nsaids. UA unremarkable, renal US w/o obstruction. Urine lytes are c/w ATN.  Pt has ATN from contrast injury. Rx is supportive care. Will dc IVF"s given SOB/ low SpO2 overnight. CXR congestion.  - Last does of Lasix was ~6AM on 5/23; likely in the diuretic phase of ATN and Cr may have finally peaked. Anticipate cr decreasing trend tomorrow. Should be stabel for d/c in the next 24-48hrs pending downward trend in cr. Will also give single dose of Klorcon40 given all his K losses with the large UOP over past few days. - No uremic findings; no indication for RRT. -Alectinib certainly can cause ATN associated renal toxicity but usually not with this type   of UOP which is why he's most likely in the recovery phase of ATN but will follow closely. Metabolic acidosis - due to severe AKI, a bit  better now. Hyperkalemia - Renal diet and will monitor K closely; stopped Lokelma. Resolved. Lung cancer - taking po chemo Hypoxia - had SOB overnight, likely vol overload from IVF's causing resp issues. CXR ordered per pmd. IVF"s dc'd. See above also.  Subjective: Pt voiding several times and had UOP 4L/2250 /48hrs. Denies dyspnea / CP / fever/ chills/ nausea.    Vitals:   06/20/21 2300 06/21/21 0442 06/21/21 0445 06/21/21 0806  BP: (!) 92/51 (!) 112/55 (!) 112/55 (!) 98/51  Pulse: 81 81 81 86  Resp: $Remo'16 13 16 15  'eqwoB$ Temp: 98.4 F (36.9 C) 98.2 F (36.8 C) 98.2 F (36.8 C) 98.3 F (36.8 C)  TempSrc: Oral Axillary  Oral  SpO2: 99% 99%  99%  Weight:  74.6 kg    Height:        Exam: Gen alert, no distress No jvd or bruits Chest clear bilat to bases RRR no RG Abd soft ntnd no mass or ascites +bs Ext 1+ bilat LE edema L >  R Neuro is alert, Ox 3 , nf, no asterixis    Home meds include - tylenol, alectinib, zyloprim, plavix, colchicine, voltaren gel, empagliflozin 10, ferr sulfate, norco, losartan 25, miralax, lyrica 50 bid, crestor, asper-flex cream      Date                          Creat               eGFR     2012- 2015              1.11- 1.32             2017- July 2022      1.14- 1.40        46- 60 ml/min                    Jan 2023                1.17-  1.52       46- >60      Feb 2023                1.24- 1.56        45- 59 ml/min, stage 3a      March  2023            1.15 May 2021                1.50     Jun 11, 2021          1.83     06/15/21                    4.73                 12     06/16/21                    5.31                 10 ml/min     UA 5/19 - 100 prot, rare bact, 0-5 rbc, 6-10 wbc, 0-5 epis    UNa 27, UCr 70      CXR 5/19- FINDINGS: Cardiac shadow is within normal limits. Lungs are well aerated bilaterally. No focal infiltrate or sizable effusion is noted. Old healed rib fractures on the left are noted. No acute bony abnormality is seen. IMPRESSION: No acute abnormality noted.     Renal US > 10-11 cm kidneys w/o hydro         Recent Labs  Lab 06/20/21 0619 06/20/21 1809 06/21/21 0626  HGB 8.5*  --  8.7*  ALBUMIN 2.3* 2.4* 2.2*  CALCIUM 8.1* 7.8* 8.1*  PHOS 8.5* 7.9* 8.2*  CREATININE 7.84* 7.38* 7.40*  K 4.3 4.2 3.2*   Inpatient medications:  alectinib  600 mg Oral BID WC   busPIRone  5 mg Oral BID   clopidogrel  75 mg Oral Daily   ferrous sulfate  325 mg Oral Q breakfast   ramelteon  8 mg Oral QHS   rosuvastatin  5 mg Oral Daily     ceFAZolin (ANCEF) IV 1 g (06/20/21 1403)    acetaminophen, guaiFENesin, HYDROcodone-acetaminophen, ipratropium-albuterol, polyethylene glycol, pregabalin

## 2021-06-21 NOTE — Progress Notes (Signed)
Physical Therapy Treatment Patient Details Name: Arthur [name] MRN: [id number] DOB: 1940-08-25 Today's Date: 06/21/2021   History of Present Illness 81 yo male adm 5/19 with SOB. Rapid worsening of met disease with AKI. PMhx: stage 4 lung CA, type 2 DM, CAD s/p PCI, HLD, HTN, gout, GERD, prior PE    PT Comments    Pt with decreased activity tolerance and significant fatigue today. Pt required physical assist for rising from bed and from surfaces with decreased gait tolerance this session. AFO at home as pt stating with edema difficult to get brace and shoe to fit at this time. PT denied further HEP or activity due to fatigue.      Recommendations for follow up therapy are one component of a multi-disciplinary discharge planning process, led by the attending physician.  Recommendations may be updated based on patient status, additional functional criteria and insurance authorization.  Follow Up Recommendations  Home health PT     Assistance Recommended at Discharge Frequent or constant Supervision/Assistance  Patient can return home with the following Assistance with cooking/housework;Assist for transportation;A little help with bathing/dressing/bathroom;A little help with walking and/or transfers   Equipment Recommendations  None recommended by PT    Recommendations for Other Services       Precautions / Restrictions Precautions Precautions: Fall;Other (comment) Precaution Comments: watch sats, left foot drop     Mobility  Bed Mobility Overal bed mobility: Needs Assistance Bed Mobility: Supine to Sit     Supine to sit: Min assist     General bed mobility comments: HOB flat with min assist to elevate trunk from surface, increased time and effort    Transfers Overall transfer level: Needs assistance   Transfers: Sit to/from Stand Sit to Stand: Min assist           General transfer comment: pt required physical assist today to rise from bed and from chair x 2  with cues for hand placement    Ambulation/Gait Ambulation/Gait assistance: Min assist Gait Distance (Feet): 40 Feet Assistive device: Rolling walker (2 wheels) Gait Pattern/deviations: Step-through pattern, Decreased stance time - left, Trunk flexed, Decreased dorsiflexion - left   Gait velocity interpretation: <1.8 ft/sec, indicate of risk for recurrent falls   General Gait Details: pt with decreased LLE clearance with foot drop with pt stating AFO received but family took it home. Pt with significant fatigue with mobility this session with 3 standing rest breaks and walked 5', 40', then 10' with seated rest between each trial and reliance on RW. SPo2 98% on RA   Stairs             Wheelchair Mobility    Modified Rankin (Stroke Patients Only)       Balance Overall balance assessment: Needs assistance   Sitting balance-Leahy Scale: Good Sitting balance - Comments: static sitting without UE support   Standing balance support: Bilateral upper extremity supported Standing balance-Leahy Scale: Poor Standing balance comment: standing with RW                            Cognition Arousal/Alertness: Awake/alert Behavior During Therapy: Flat affect Overall Cognitive Status: Impaired/Different from baseline Area of Impairment: Safety/judgement                         Safety/Judgement: Decreased awareness of deficits     General Comments: pt with linens and gown soaked in urine and pt reports  occurred last night without him notifying staff.        Exercises      General Comments        Pertinent Vitals/Pain Pain Assessment Pain Assessment: No/denies pain    Home Living                          Prior Function            PT Goals (current goals can now be found in the care plan section) Progress towards PT goals: Progressing toward goals    Frequency    Min 2X/week      PT Plan Current plan remains appropriate     Co-evaluation              AM-PAC PT "6 Clicks" Mobility   Outcome Measure  Help needed turning from your back to your side while in a flat bed without using bedrails?: None Help needed moving from lying on your back to sitting on the side of a flat bed without using bedrails?: A Little Help needed moving to and from a bed to a chair (including a wheelchair)?: A Little Help needed standing up from a chair using your arms (e.g., wheelchair or bedside chair)?: A Little Help needed to walk in hospital room?: A Little Help needed climbing 3-5 steps with a railing? : Total 6 Click Score: 17    End of Session   Activity Tolerance: Patient tolerated treatment well Patient left: in chair;with call bell/phone within reach;with chair alarm set Nurse Communication: Mobility status PT Visit Diagnosis: Difficulty in walking, not elsewhere classified (R26.2);Muscle weakness (generalized) (M62.81)     Time: 0822-0908 PT Time Calculation (min) (ACUTE ONLY): 46 min  Charges:  $Gait Training: 8-22 mins $Therapeutic Activity: 23-37 mins                     Bruchy Mikel P, PT Acute Rehabilitation Services Pager: [phone] Office: [place] 06/21/2021, 10:48 AM

## 2021-06-22 LAB — RENAL FUNCTION PANEL
Albumin: 2.1 g/dL — ABNORMAL LOW (ref 3.5–5.0)
Albumin: 2.3 g/dL — ABNORMAL LOW (ref 3.5–5.0)
Anion gap: 14 (ref 5–15)
Anion gap: 13 (ref 5–15)
BUN: 63 mg/dL — ABNORMAL HIGH (ref 8–23)
BUN: 63 mg/dL — ABNORMAL HIGH (ref 8–23)
CO2: 24 mmol/L (ref 22–32)
CO2: 26 mmol/L (ref 22–32)
Calcium: 8.1 mg/dL — ABNORMAL LOW (ref 8.9–10.3)
Calcium: 7.9 mg/dL — ABNORMAL LOW (ref 8.9–10.3)
Chloride: 97 mmol/L — ABNORMAL LOW (ref 98–111)
Chloride: 96 mmol/L — ABNORMAL LOW (ref 98–111)
Creatinine, Ser: 6.3 mg/dL — ABNORMAL HIGH (ref 0.61–1.24)
Creatinine, Ser: 5.89 mg/dL — ABNORMAL HIGH (ref 0.61–1.24)
GFR, Estimated: 8 mL/min — ABNORMAL LOW (ref >60)
GFR, Estimated: 9 mL/min — ABNORMAL LOW (ref >60)
Glucose, Bld: 106 mg/dL — ABNORMAL HIGH (ref 70–99)
Glucose, Bld: 179 mg/dL — ABNORMAL HIGH (ref 70–99)
Phosphorus: 6.8 mg/dL — ABNORMAL HIGH (ref 2.5–4.6)
Phosphorus: 5.7 mg/dL — ABNORMAL HIGH (ref 2.5–4.6)
Potassium: 3.5 mmol/L (ref 3.5–5.1)
Potassium: 2.9 mmol/L — ABNORMAL LOW (ref 3.5–5.1)
Sodium: 135 mmol/L (ref 135–145)
Sodium: 135 mmol/L (ref 135–145)

## 2021-06-22 LAB — CULTURE, BLOOD (ROUTINE X 2)
Special Requests: ADEQUATE
Special Requests: ADEQUATE

## 2021-06-22 LAB — CBC
HCT: 25 % — ABNORMAL LOW (ref 39.0–52.0)
Hemoglobin: 8.4 g/dL — ABNORMAL LOW (ref 13.0–17.0)
MCH: 29 pg (ref 26.0–34.0)
MCHC: 33.6 g/dL (ref 30.0–36.0)
MCV: 86.2 fL (ref 80.0–100.0)
Platelets: 318 10*3/uL (ref 150–400)
RBC: 2.9 MIL/uL — ABNORMAL LOW (ref 4.22–5.81)
RDW: 16.9 % — ABNORMAL HIGH (ref 11.5–15.5)
WBC: 9.5 10*3/uL (ref 4.0–10.5)
nRBC: 0 % (ref 0.0–0.2)

## 2021-06-22 MED ORDER — CEFAZOLIN IV (FOR PTA / DISCHARGE USE ONLY): 2.0000 g | Freq: Three times a day (TID) | INTRAVENOUS | 0 refills | Status: DC

## 2021-06-22 MED ORDER — POTASSIUM CHLORIDE CRYS ER 20 MEQ PO TBCR
40.0000 meq | EXTENDED_RELEASE_TABLET | Freq: Once | ORAL | Status: AC
  Administered 2021-06-22: 40 meq via ORAL
  Filled 2021-06-22: qty 2

## 2021-06-22 MED ORDER — CEFAZOLIN IV (FOR PTA / DISCHARGE USE ONLY): 1.0000 g | INTRAVENOUS | 0 refills | Status: AC

## 2021-06-22 NOTE — Progress Notes (Signed)
Patient ID: Arthur [name], male   DOB: [birth year], 81 y.o.   MRN: [id number]  [facility] Progress Note   Assessment/ Plan:   1. Acute kidney Injury on chronic kidney disease stage IIIa: Baseline creatinine ranging 1.2-1.5 and developed acute kidney injury from what appears to be primarily contrast nephropathy +/- ATN.  Excellent urine output noted overnight with slight improvement of anemia indicating that he is likely on the way to renal recovery after plateau phase of ATN.  He does not have any acute electrolyte abnormality or indications for hemodialysis.  Unfortunately, I suspect renal recovery will be delayed by his intermittent hypotension. 2.  Hypokalemia: Status post treatment for hyperkalemia currently off of Lokelma. 3.  MSSA bacteremia: Afebrile and without leukocytosis but with notable hypotension.  Continue intravenous cefazolin. 4.  Dyspnea: With history of non-small cell lung cancer/pulmonary edema-previously on chemotherapy with Alectinib. 5.  Anemia: Likely multifactorial with underlying malignancy current acute/critical illness.  Avoid ESA.  Subjective:   Reports to be feeling well.  Daughter at bedside is concerned about his continued weakness/need for PT.   Objective:   BP (!) 83/57 (BP Location: Right Arm)   Pulse 73   Temp 97.6 F (36.4 C) (Oral)   Resp 16   Ht 5\' 11"  (1.803 m)   Wt 70.6 kg   SpO2 99%   BMI 21.71 kg/m   Intake/Output Summary (Last 24 hours) at 06/22/2021 1016 Last data filed at 06/22/2021 0749 Gross per 24 hour  Intake --  Output 2300 ml  Net -2300 ml   Weight change: -4 kg  Physical Exam: Gen: Appears comfortable resting in bed, no apparent distress.  No asterixis CVS: Pulse regular rhythm, normal rate, S1 and S2 normal Resp: With decreased breath sounds over bases, no rales/rhonchi Abd: Soft, flat, nontender Ext: Bilateral compression stockings in place, no pedal edema  Imaging: ECHOCARDIOGRAM COMPLETE  Result Date:  06/21/2021    ECHOCARDIOGRAM REPORT   Patient Name:   Arthur [name] [facility] Date of Exam: 06/21/2021 Medical Rec #:  [id number]         Height:       71.0 in Accession #:    [id number]        Weight:       164.5 lb Date of Birth:  08-Sep-1940          BSA:          1.940 m Patient Age:    65 years          BP:           114/44 mmHg Patient Gender: M                 HR:           73 bpm. Exam Location:  Inpatient Procedure: 2D Echo, 3D Echo, Cardiac Doppler and Color Doppler Indications:    R01.1 Murmur. Bacteremia.  History:        Patient has prior history of Echocardiogram examinations, most                 recent 02/04/2021. CAD, Signs/Symptoms:Bacteremia and Murmur; Risk                 Factors:Dyslipidemia, Hypertension and Diabetes. Pulmonary                 embolus. Metastatic lung cancer.  Sonographer:    Roseanna Rainbow RDCS Referring Phys: [id number] CARINA M BROWN  Sonographer Comments: Technically difficult study  due to poor echo windows and suboptimal parasternal window. IMPRESSIONS  1. Left ventricular ejection fraction, by estimation, is 65 to 70%. Left ventricular ejection fraction by 2D MOD biplane is 70.2 %. The left ventricle has normal function. The left ventricle has no regional wall motion abnormalities. There is moderate asymmetric left ventricular hypertrophy of the basal-septal segment. Left ventricular diastolic function could not be evaluated.  2. Right ventricular systolic function is normal. The right ventricular size is normal. There is moderately elevated pulmonary artery systolic pressure. The estimated right ventricular systolic pressure is 81.4 mmHg.  3. Right atrial size was mildly dilated.  4. The pericardial effusion is anterior to the right ventricle.  5. The mitral valve is grossly normal. No evidence of mitral valve regurgitation.  6. Heavy calcification on the tip of the non-coronary cusp of the aortic valve. The aortic valve is calcified. Aortic valve regurgitation is trivial. Aortic valve  sclerosis/calcification is present, without any evidence of aortic stenosis. Aortic valve mean gradient measures 8.8 mmHg.  7. The inferior vena cava is dilated in size with <50% respiratory variability, suggesting right atrial pressure of 15 mmHg.  8. Rhythm strip during this exam demostrated atrial fibrillation. Comparison(s): Changes from prior study are noted. 03/18/2021: LVEF 55-60%, severe ASH, small RV anterior pericardial effusion. Conclusion(s)/Recommendation(s): No evidence of valvular vegetations on this transthoracic echocardiogram. Consider a transesophageal echocardiogram to exclude infective endocarditis if clinically indicated. FINDINGS  Left Ventricle: Left ventricular ejection fraction, by estimation, is 65 to 70%. Left ventricular ejection fraction by 2D MOD biplane is 70.2 %. The left ventricle has normal function. The left ventricle has no regional wall motion abnormalities. 3D left ventricular ejection fraction analysis performed but not reported based on interpreter judgement due to suboptimal tracking. The left ventricular internal cavity size was normal in size. There is moderate asymmetric left ventricular hypertrophy of the basal-septal segment. Left ventricular diastolic function could not be evaluated due to atrial fibrillation. Left ventricular diastolic function could not be evaluated. Right Ventricle: The right ventricular size is normal. No increase in right ventricular wall thickness. Right ventricular systolic function is normal. There is moderately elevated pulmonary artery systolic pressure. The tricuspid regurgitant velocity is 2.99 m/s, and with an assumed right atrial pressure of 15 mmHg, the estimated right ventricular systolic pressure is 48.1 mmHg. Left Atrium: Left atrial size was normal in size. Right Atrium: Right atrial size was mildly dilated. Pericardium: Trivial pericardial effusion is present. The pericardial effusion is anterior to the right ventricle. Mitral Valve:  The mitral valve is grossly normal. No evidence of mitral valve regurgitation. Tricuspid Valve: The tricuspid valve is grossly normal. Tricuspid valve regurgitation is trivial. Aortic Valve: Heavy calcification on the tip of the non-coronary cusp of the aortic valve. The aortic valve is calcified. Aortic valve regurgitation is trivial. Aortic valve sclerosis/calcification is present, without any evidence of aortic stenosis. Aortic valve mean gradient measures 8.8 mmHg. Aortic valve peak gradient measures 16.2 mmHg. Aortic valve area, by VTI measures 2.46 cm. Pulmonic Valve: The pulmonic valve was normal in structure. Pulmonic valve regurgitation is not visualized. Aorta: The aortic root and ascending aorta are structurally normal, with no evidence of dilitation. Venous: The inferior vena cava is dilated in size with less than 50% respiratory variability, suggesting right atrial pressure of 15 mmHg. IAS/Shunts: No atrial level shunt detected by color flow Doppler. EKG: Rhythm strip during this exam demostrated atrial fibrillation.  LEFT VENTRICLE PLAX 2D  Biplane EF (MOD) LVIDd:         4.70 cm         LV Biplane EF:   Left LVIDs:         3.10 cm                          ventricular LV PW:         0.90 cm                          ejection LV IVS:        0.80 cm                          fraction by LVOT diam:     2.10 cm                          2D MOD LV SV:         89                               biplane is LV SV Index:   46                               70.2 %. LVOT Area:     3.46 cm                                Diastology                                LV e' medial:    9.57 cm/s LV Volumes (MOD)               LV E/e' medial:  12.9 LV vol d, MOD    72.1 ml       LV e' lateral:   9.36 cm/s A2C:                           LV E/e' lateral: 13.1 LV vol d, MOD    53.9 ml A4C: LV vol s, MOD    16.5 ml A2C: LV vol s, MOD    19.9 ml       3D Volume EF: A4C:                           3D EF:         59 % LV SV MOD A2C:   55.6 ml       LV EDV:       102 ml LV SV MOD A4C:   53.9 ml       LV ESV:       42 ml LV SV MOD BP:    44.4 ml       LV SV:        60 ml RIGHT VENTRICLE             IVC RV S prime:     12.65 cm/s  IVC diam: 2.20 cm TAPSE (M-mode): 1.7 cm LEFT ATRIUM  Index        RIGHT ATRIUM           Index LA diam:        3.40 cm 1.75 cm/m   RA Area:     20.60 cm LA Vol (A2C):   46.2 ml 23.81 ml/m  RA Volume:   62.60 ml  32.26 ml/m LA Vol (A4C):   45.6 ml 23.50 ml/m LA Biplane Vol: 46.6 ml 24.02 ml/m  AORTIC VALVE AV Area (Vmax):    2.25 cm AV Area (Vmean):   2.35 cm AV Area (VTI):     2.46 cm AV Vmax:           201.40 cm/s AV Vmean:          137.800 cm/s AV VTI:            0.361 m AV Peak Grad:      16.2 mmHg AV Mean Grad:      8.8 mmHg LVOT Vmax:         131.00 cm/s LVOT Vmean:        93.600 cm/s LVOT VTI:          0.256 m LVOT/AV VTI ratio: 0.71  AORTA Ao Root diam: 3.50 cm Ao Asc diam:  2.80 cm MITRAL VALVE                TRICUSPID VALVE MV Area (PHT): 3.48 cm     TR Peak grad:   35.8 mmHg MV Decel Time: 218 msec     TR Vmax:        299.00 cm/s MV E velocity: 123.00 cm/s MV A velocity: 91.20 cm/s   SHUNTS MV E/A ratio:  1.35         Systemic VTI:  0.26 m                             Systemic Diam: 2.10 cm Lyman Bishop MD Electronically signed by Lyman Bishop MD Signature Date/Time: 06/21/2021/4:05:10 PM    Final    CT EXTREMITY LOWER LEFT WO CONTRAST  Result Date: 06/20/2021 CLINICAL DATA:  Hematoma. Known left lower extremity deep venous thrombosis seen on ultrasound. History of lung cancer. EXAM: CT OF THE LOWER LEFT EXTREMITY WITHOUT CONTRAST TECHNIQUE: Multidetector CT imaging of the lower left extremity was performed according to the standard protocol. RADIATION DOSE REDUCTION: This exam was performed according to the departmental dose-optimization program which includes automated exposure control, adjustment of the mA and/or kV according to patient size and/or use of  iterative reconstruction technique. COMPARISON:  MRI left thigh 06/15/2021 FINDINGS: Bones/Joint/Cartilage Old healed proximal fibular diaphyseal fracture. Severe medial and patellofemoral compartment joint space narrowing and peripheral osteophytosis of the left knee. Chronic well corticated ossicle distal to the medial malleolus likely the sequela of remote trauma. Moderate calcaneocuboid osteoarthritis. Moderate plantar calcaneal heel spur. No acute fracture or dislocation. No cortical erosion is seen. Ligaments Suboptimally assessed by CT. Muscles and Tendons There is a dense fluid collection within the posterior compartment of the mid left thigh measuring up to approximately 5.5 x 6.7 x 19 cm, similar to 4.9 x 6.5 x 23 cm on 06/15/2021. There is again a small fat intensity lipoma superficial to the left proximal rectus femoris muscle (axial series 4, image 31). There is again moderate edema within the subcutaneous fat of the left thigh and proximal left calf. Soft tissues There are dense atherosclerotic calcifications within the visualized  left lower extremity arteries. Small Baker's cyst. Partial visualization of the prior previously seen large right inguinal hernia containing mesenteric fat and nondistended loops of small bowel. IMPRESSION: 1. Stable to slightly decreased size of the previously seen left thigh posterior compartment complex fluid collection, likely an evolving hematoma. 2. No acute fracture. Old healed proximal left fibular diaphyseal fracture. 3. Small Baker's cyst. Electronically Signed   By: Yvonne Kendall M.D.   On: 06/20/2021 17:07   VAS [place] UPPER EXTREMITY VENOUS DUPLEX  Result Date: 06/20/2021 UPPER VENOUS STUDY  Patient Name:  JESAIAH FABIANO [facility]  Date of Exam:   06/20/2021 Medical Rec #: [id number]          Accession #:    [id number] Date of Birth: Apr 29, 1940           Patient Gender: M Patient Age:   62 years Exam Location:  [facility] Procedure:      VAS [place] UPPER EXTREMITY  VENOUS DUPLEX Referring Phys: Burna Cash [facility] --------------------------------------------------------------------------------  Indications: Pain, Swelling, and Erythema Other Indications: Recent IV placement to LUE. Risk Factors: Cancer patient (stage 4 NSCLC) PE (01/2021) DVT BLE (02/2021) S/P IVC filter. Comparison Study: No previous exams Performing Technologist: Jody Hill RVT, RDMS  Examination Guidelines: A complete evaluation includes B-mode imaging, spectral Doppler, color Doppler, and power Doppler as needed of all accessible portions of each vessel. Bilateral testing is considered an integral part of a complete examination. Limited examinations for reoccurring indications may be performed as noted.  Right Findings: +----------+------------+---------+-----------+----------+--------------------+ RIGHT     CompressiblePhasicitySpontaneousProperties      Summary        +----------+------------+---------+-----------+----------+--------------------+ Subclavian               Yes       Yes                   patent by                                                              color/doppler     +----------+------------+---------+-----------+----------+--------------------+  Left Findings: +----------+------------+---------+-----------+----------+-------+ LEFT      CompressiblePhasicitySpontaneousPropertiesSummary +----------+------------+---------+-----------+----------+-------+ IJV           Full       Yes       Yes                      +----------+------------+---------+-----------+----------+-------+ Subclavian    Full       Yes       Yes                      +----------+------------+---------+-----------+----------+-------+ Axillary      Full       Yes       Yes                      +----------+------------+---------+-----------+----------+-------+ Brachial      Full       Yes       Yes                       +----------+------------+---------+-----------+----------+-------+ Radial        Full                                          +----------+------------+---------+-----------+----------+-------+  Ulnar         Full                                          +----------+------------+---------+-----------+----------+-------+ Cephalic      None       No        No                Acute  +----------+------------+---------+-----------+----------+-------+ Basilic       Full       Yes       Yes                      +----------+------------+---------+-----------+----------+-------+  Summary:  Right: No evidence of superficial vein thrombosis in the upper extremity.  Left: No evidence of deep vein thrombosis in the upper extremity. Findings consistent with acute superficial vein thrombosis involving the left cephalic vein.  *See table(s) above for measurements and observations.  Diagnosing physician: Harold Barban MD Electronically signed by Harold Barban MD on 06/20/2021 at 8:54:45 PM.    Final     Labs: BMET Recent Labs  Lab 06/17/21 9622 06/17/21 1626 06/19/21 2979 06/19/21 1657 06/20/21 8921 06/20/21 1809 06/21/21 0626 06/21/21 1647 06/22/21 0602  NA 135   < > 134* 134* 136 135 134* 135 135  K 4.8   < > 4.8 4.8 4.3 4.2 3.2* 3.0* 3.5  CL 107   < > 105 97* 101 100 98 97* 97*  CO2 18*   < > 19* 23 23 20* 21* 24 24  GLUCOSE 99   < > 89 141* 108* 128* 96 191* 106*  BUN 53*   < > 61* 65* 65* 67* 68* 67* 63*  CREATININE 6.39*   < > 7.59* 7.54* 7.84* 7.38* 7.40* 6.78* 6.30*  CALCIUM 8.0*   < > 8.0* 8.4* 8.1* 7.8* 8.1* 8.0* 8.1*  PHOS 7.2*  --   --  8.6* 8.5* 7.9* 8.2* 7.6* 6.8*   < > = values in this interval not displayed.   CBC Recent Labs  Lab 06/17/21 0444 06/18/21 0620 06/19/21 0324 06/20/21 0619 06/21/21 0626 06/22/21 0602  WBC 7.7   < > 6.5 17.2* 13.1* 9.5  NEUTROABS 5.3  --   --   --   --   --   HGB 9.0*   < > 8.4* 8.5* 8.7* 8.4*  HCT 27.1*   < > 25.5* 26.3*  26.2* 25.0*  MCV 88.6   < > 87.6 88.3 87.3 86.2  PLT 274   < > 270 280 283 318   < > = values in this interval not displayed.    Medications:     alectinib  600 mg Oral BID WC   busPIRone  5 mg Oral BID   chewing gum (ORBIT) sugar free  1 Stick Oral Daily   clopidogrel  75 mg Oral Daily   ferrous sulfate  325 mg Oral Q breakfast   potassium chloride  40 mEq Oral BID   ramelteon  8 mg Oral QHS   rosuvastatin  5 mg Oral Daily      Elmarie Shiley, MD 06/22/2021, 10:16 AM

## 2021-06-22 NOTE — Progress Notes (Addendum)
PHARMACY CONSULT NOTE FOR:  OUTPATIENT  PARENTERAL ANTIBIOTIC THERAPY (OPAT)  Indication: bacteremia/cellulitis Regimen: cefazolin 1g IV q 24h End date: 07/19/2021  IV antibiotic discharge orders are pended. To discharging provider:  please sign these orders via discharge navigator,  Select New Orders & click on the button choice - Manage This Unsigned Work.    Thank you for involving pharmacy in this patient's care.  Elita Quick, PharmD PGY1 Ambulatory Care Pharmacy Resident 06/22/2021 9:38 AM  **Pharmacist phone directory can be found on [email or web address] listed under [place]**

## 2021-06-22 NOTE — Discharge Instructions (Signed)
Dear Arthur [name],  Thank you for letting us participate in your care. You were hospitalized for shortness of breath and acute renal failure. You were also found to have a blood stream infection with bacteria.   POST-HOSPITAL & CARE INSTRUCTIONS You will need IV antibiotics at home for four weeks. Home health has been set up to facilitate this.  You will need to follow up with your primary care clinic, appointment details below.  You need to follow up with the nephrologist (kidney doctor). Phone number below. Please call for appointment.  Go to your follow up appointments (listed below)  DOCTOR'S APPOINTMENT   Future Appointments  Date Time Provider [place]  07/04/2021  9:45 AM Wells Guiles, DO [facility] [place]  07/12/2021  9:00 AM [facility] None  07/12/2021  9:30 AM Curt Bears, MD [facility] None  07/17/2021 10:30 AM Lind Covert, MD [facility]  08/13/2021  9:00 AM Laurice Record, MD [facility]    Follow-up Information     Wells Guiles, DO. Go on 07/04/2021.   Specialty: Family Medicine Why: At 9:45 am. Please arrive by 9:30 am. This is your hospital follow up appointment with the family medicine appointment. If this day and time does not work well for you, please call the clinic directly to reschedule. Contact information: [place] [address] [phone]         Lind Covert, MD. Go on 07/17/2021.   Specialty: Family Medicine Why: At 10:30 am. Please arrive by 10:15 am. This is an appointment with your PCP to ensure you are doing well on your home antibiotics. If this day and time does not work well for you, please call the clinic directly to reschedule. Contact information: [place] [address] [phone]         Buford Dresser, MD .   Specialty: Cardiology Contact information: [address] [place] [address] [place], [facility] [place] Follow up.   Specialty: Home Health Services Why: Registered Nurse, Physical Therapy-office to call with visit times. Contact information: [place] [address] [phone]         Gean Quint, MD. Schedule an appointment as soon as possible for a visit.   Specialty: Nephrology Why: Call and make a nephrology follow up appointment. You saw Dr. Candiss Norse in the hospital. Contact information: [place] [address] [phone]               Take care and be well!  [facility]  [place], [place] [address] [phone]

## 2021-06-22 NOTE — Consult Note (Signed)
   [facility] CM Inpatient Consult   06/22/2021  Magdaleno KRIS NO 09-08-1940 [id number]  [place] [facility] [ACO] Patient: [employer] Medicare   Primary Care Provider:  Lind Covert, MD, [place], is an embedded provider with a Chronic Care Management team and program, and is listed for the transition of care follow up and appointments.  Patient was screened for length of stay with high risk scores for unplanned readmission risk.  Embedded practice service needs for chronic care management has been offered in the past and has recently declined the program but knows how to access engagement for needs.    Plan: A referral can be sent to the [place] Management for any new needs. Follow for needs.  Please contact for further questions,  Natividad Brood, RN BSN [facility] Liaison  [phone] business mobile phone Toll free office [phone]  Fax number: [phone] [email or web address] '

## 2021-06-22 NOTE — TOC Progression Note (Addendum)
Transition of Care [facility]) - Progression Note    Patient Details  Name: Arthur [name] MRN: [id number] Date of Birth: 08-10-1940  Transition of [facility] CM/SW Contact  Graves-Bigelow, Ocie Cornfield, RN Phone Number: 06/22/2021, 11:50 AM  Clinical Narrative:  Case Manager received notification that the patient will need IV antibiotics for home use. [facility] will follow up regarding the IV antibiotics. Pam Liaison with [facility] will educate the patient and family this evening. Case Manager reached out to the daughter to discuss plan of care. [facility] will assist with RN services for the IV antibiotics and PT. Unsure of discharge date at this time. Case Manager did reach out to the providers to get an estimated date-awaiting confirmation.   7915 06-22-21 Patient will need PICC Line before transitioning home.   Expected Discharge Plan: [place] Barriers to Discharge: Continued Medical Work up  Expected Discharge Plan and Services Expected Discharge Plan: [place] In-house Referral: NA Discharge Planning Services: CM Consult Post Acute Care Choice: [place], Resumption of Svcs/PTA Provider Living arrangements for the past 2 months: Single Family Home                   DME Agency: NA       HH Arranged: RN, Disease Management, IV Antibiotics, PT HH Agency: [facility] Date [facility] Agency Contacted: 06/22/21 Time [place]: 1150 Representative spoke with at [place]: Anderson [place]  Readmission Risk Interventions    06/19/2021    4:57 PM  Readmission Risk Prevention Plan  Transportation Screening Complete  PCP or Specialist Appt within 3-5 Days Complete  HRI or [place] Complete  Social Work Consult for [place] Planning/Counseling Complete  Medication Review Press photographer) Complete

## 2021-06-22 NOTE — Progress Notes (Signed)
FPTS Brief Progress Note  S:Patient sleeping comfortably. Spoke with family member bedside and relayed echo results. Discussed current plan and answered any questions    O: BP (!) 107/50 (BP Location: Left Arm)   Pulse 78   Temp (!) 97.5 F (36.4 C) (Oral)   Resp 14   Ht 5\' 11"  (1.803 m)   Wt 74.6 kg   SpO2 100%   BMI 22.94 kg/m     A/P: Continue current management  - Orders reviewed. Labs for AM ordered, which was adjusted as needed.    Shary Key, DO 06/22/2021, 1:44 AM PGY-2, [place] [facility] Night Resident  Please page [phone] with questions.

## 2021-06-22 NOTE — Progress Notes (Signed)
PT Cancellation Note  Patient Details Name: NYZAIAH KAI MRN: [id number] DOB: 09/10/1940   Cancelled Treatment:    Reason Eval/Treat Not Completed: Patient declined, no reason specified. Pt reports he just had a bout of emesis, is lethargic, and his legs are hurting, politely declining PT at this time. Will plan to follow-up another day as able.   Moishe Spice, PT, DPT Acute Rehabilitation Services  Pager: [phone] Office: [place] 06/22/2021, 5:14 PM

## 2021-06-22 NOTE — Progress Notes (Addendum)
Family Medicine Teaching Service Daily Progress Note Intern Pager: [phone]  Patient name: Arthur [name] Medical record number: [id number] Date of birth: 1940-06-10 Age: 81 y.o. Gender: male  Primary Care Provider: Lind Covert, MD Consultants: Nephro, VVS, ID, Med Onc Code Status: Full  Pt Overview and Major Events to Date:  5/19: Admitted with dyspnea, found to have profound AKI 5/20: Hemoglobin downtrending, dyspnea improved entirely 5/22: VVS-no hematoma surgical intervention; Buspar added; Lasix increased to 160 mg Q8H  Assessment and Plan:  Arthur [name] is an 81 year old male presenting with dyspnea likely multifactorial, due to progression of NSCLC. History significant for CAD s/p stent (January),  pulmonary embolism  (January), DVT on apixaban->enoxaparin, LLE hematoma requiring transfusion and IVC filter placement (April), and NSCLC (Stage IV) on alectinib   MSSA bacteremia Patient has not been febrile, has had some hypotension to 80s over 50s this morning.  Repeat blood culture was taken yesterday less than 24 hours no growth.  PIV site is tender on left arm but getting less erythematous and warm.  TTE showed 65-70% EF  -Monitor vitals -ID following, appreciate recs -Cefazolin 1 g every 24 hours -Consider TEE  -OPAT orders in  Dyspnea likely due to progression of NSCLC/pulmonary edema in setting of worsening anemia/renal function,resolving No hypoxia, on room air, denies any shortness of breath and has not had any hypoxia overnight. -Monitor respiratory status -Keep O2 sats greater than 92% -DuoNebs every 4 hours as needed -Mucinex  Acute renal failure in setting of CKD 3 A  Contrast induced ATN Creatinine starting to improve today it is 6.3 from 6.78 yesterday.  UOP is 2.2 L with some unmeasured output as well. -Nephrology following, appreciate recommendations -Creatinine trend  Normocytic anemia Hemoglobin 8.4 from 8.7 yesterday, stable -Monitor  on CBC  Hx DVT and PE Bilateral DVT in previous scans -inability to anticoagulate d/t hematoma  Goals of care -monitor, full code  Chronic/stable Recent stroke-Plavix and Crestor Anxiety-BuSpar 5 mg twice daily Left foot drop-PT Hematoma-resolving on most recent CT scan, no pain and no surgical intervention per VVS  FEN/GI: Renal diet with fluid restriciton PPx: SCDs Dispo:Home w HH pending clinical improvement  Subjective:  Denies any shortness of breath, PIV site painful but getting better  Objective: Temp:  [97.5 F (36.4 C)-98.3 F (36.8 C)] 97.8 F (36.6 C) (05/26 0550) Pulse Rate:  [72-86] 73 (05/26 0550) Resp:  [14-18] 16 (05/26 0550) BP: (98-114)/(44-51) 109/50 (05/26 0550) SpO2:  [97 %-100 %] 99 % (05/26 0550) Weight:  [70.6 kg] 70.6 kg (05/26 0550) Physical Exam: General: NAD, laying in bed comfortable, alert and responsive Cardiovascular: RRR no m/r/g Respiratory: CTAB no w/r/c Abdomen: Nontender, soft, nodistended Extremities: No lower extremity edema  Laboratory: Recent Labs  Lab 06/20/21 0619 06/21/21 0626 06/22/21 0602  WBC 17.2* 13.1* 9.5  HGB 8.5* 8.7* 8.4*  HCT 26.3* 26.2* 25.0*  PLT 280 283 318   Recent Labs  Lab 06/21/21 0626 06/21/21 1647 06/22/21 0602  NA 134* 135 135  K 3.2* 3.0* 3.5  CL 98 97* 97*  CO2 21* 24 24  BUN 68* 67* 63*  CREATININE 7.40* 6.78* 6.30*  CALCIUM 8.1* 8.0* 8.1*  GLUCOSE 96 191* 106*    Imaging/Diagnostic Tests: ECHOCARDIOGRAM COMPLETE  Result Date: 06/21/2021    ECHOCARDIOGRAM REPORT   Patient Name:   Arthur [name] Date of Exam: 06/21/2021 Medical Rec #:  [id number]         Height:  71.0 in Accession #:    [id number]        Weight:       164.5 lb Date of Birth:  1940-04-02          BSA:          1.940 m Patient Age:    76 years          BP:           114/44 mmHg Patient Gender: M                 HR:           73 bpm. Exam Location:  Inpatient Procedure: 2D Echo, 3D Echo, Cardiac Doppler and Color  Doppler Indications:    R01.1 Murmur. Bacteremia.  History:        Patient has prior history of Echocardiogram examinations, most                 recent 02/04/2021. CAD, Signs/Symptoms:Bacteremia and Murmur; Risk                 Factors:Dyslipidemia, Hypertension and Diabetes. Pulmonary                 embolus. Metastatic lung cancer.  Sonographer:    Roseanna Rainbow RDCS Referring Phys: [id number] CARINA M BROWN  Sonographer Comments: Technically difficult study due to poor echo windows and suboptimal parasternal window. IMPRESSIONS  1. Left ventricular ejection fraction, by estimation, is 65 to 70%. Left ventricular ejection fraction by 2D MOD biplane is 70.2 %. The left ventricle has normal function. The left ventricle has no regional wall motion abnormalities. There is moderate asymmetric left ventricular hypertrophy of the basal-septal segment. Left ventricular diastolic function could not be evaluated.  2. Right ventricular systolic function is normal. The right ventricular size is normal. There is moderately elevated pulmonary artery systolic pressure. The estimated right ventricular systolic pressure is 00.1 mmHg.  3. Right atrial size was mildly dilated.  4. The pericardial effusion is anterior to the right ventricle.  5. The mitral valve is grossly normal. No evidence of mitral valve regurgitation.  6. Heavy calcification on the tip of the non-coronary cusp of the aortic valve. The aortic valve is calcified. Aortic valve regurgitation is trivial. Aortic valve sclerosis/calcification is present, without any evidence of aortic stenosis. Aortic valve mean gradient measures 8.8 mmHg.  7. The inferior vena cava is dilated in size with <50% respiratory variability, suggesting right atrial pressure of 15 mmHg.  8. Rhythm strip during this exam demostrated atrial fibrillation. Comparison(s): Changes from prior study are noted. 03/18/2021: LVEF 55-60%, severe ASH, small RV anterior pericardial effusion.  Conclusion(s)/Recommendation(s): No evidence of valvular vegetations on this transthoracic echocardiogram. Consider a transesophageal echocardiogram to exclude infective endocarditis if clinically indicated. FINDINGS  Left Ventricle: Left ventricular ejection fraction, by estimation, is 65 to 70%. Left ventricular ejection fraction by 2D MOD biplane is 70.2 %. The left ventricle has normal function. The left ventricle has no regional wall motion abnormalities. 3D left ventricular ejection fraction analysis performed but not reported based on interpreter judgement due to suboptimal tracking. The left ventricular internal cavity size was normal in size. There is moderate asymmetric left ventricular hypertrophy of the basal-septal segment. Left ventricular diastolic function could not be evaluated due to atrial fibrillation. Left ventricular diastolic function could not be evaluated. Right Ventricle: The right ventricular size is normal. No increase in right ventricular wall thickness. Right ventricular systolic function is normal. There is  moderately elevated pulmonary artery systolic pressure. The tricuspid regurgitant velocity is 2.99 m/s, and with an assumed right atrial pressure of 15 mmHg, the estimated right ventricular systolic pressure is 29.7 mmHg. Left Atrium: Left atrial size was normal in size. Right Atrium: Right atrial size was mildly dilated. Pericardium: Trivial pericardial effusion is present. The pericardial effusion is anterior to the right ventricle. Mitral Valve: The mitral valve is grossly normal. No evidence of mitral valve regurgitation. Tricuspid Valve: The tricuspid valve is grossly normal. Tricuspid valve regurgitation is trivial. Aortic Valve: Heavy calcification on the tip of the non-coronary cusp of the aortic valve. The aortic valve is calcified. Aortic valve regurgitation is trivial. Aortic valve sclerosis/calcification is present, without any evidence of aortic stenosis. Aortic valve  mean gradient measures 8.8 mmHg. Aortic valve peak gradient measures 16.2 mmHg. Aortic valve area, by VTI measures 2.46 cm. Pulmonic Valve: The pulmonic valve was normal in structure. Pulmonic valve regurgitation is not visualized. Aorta: The aortic root and ascending aorta are structurally normal, with no evidence of dilitation. Venous: The inferior vena cava is dilated in size with less than 50% respiratory variability, suggesting right atrial pressure of 15 mmHg. IAS/Shunts: No atrial level shunt detected by color flow Doppler. EKG: Rhythm strip during this exam demostrated atrial fibrillation.  LEFT VENTRICLE PLAX 2D                        Biplane EF (MOD) LVIDd:         4.70 cm         LV Biplane EF:   Left LVIDs:         3.10 cm                          ventricular LV PW:         0.90 cm                          ejection LV IVS:        0.80 cm                          fraction by LVOT diam:     2.10 cm                          2D MOD LV SV:         89                               biplane is LV SV Index:   46                               70.2 %. LVOT Area:     3.46 cm                                Diastology                                LV e' medial:    9.57 cm/s LV Volumes (MOD)  LV E/e' medial:  12.9 LV vol d, MOD    72.1 ml       LV e' lateral:   9.36 cm/s A2C:                           LV E/e' lateral: 13.1 LV vol d, MOD    53.9 ml A4C: LV vol s, MOD    16.5 ml A2C: LV vol s, MOD    19.9 ml       3D Volume EF: A4C:                           3D EF:        59 % LV SV MOD A2C:   55.6 ml       LV EDV:       102 ml LV SV MOD A4C:   53.9 ml       LV ESV:       42 ml LV SV MOD BP:    44.4 ml       LV SV:        60 ml RIGHT VENTRICLE             IVC RV S prime:     12.65 cm/s  IVC diam: 2.20 cm TAPSE (M-mode): 1.7 cm LEFT ATRIUM             Index        RIGHT ATRIUM           Index LA diam:        3.40 cm 1.75 cm/m   RA Area:     20.60 cm LA Vol (A2C):   46.2 ml 23.81 ml/m  RA Volume:   62.60  ml  32.26 ml/m LA Vol (A4C):   45.6 ml 23.50 ml/m LA Biplane Vol: 46.6 ml 24.02 ml/m  AORTIC VALVE AV Area (Vmax):    2.25 cm AV Area (Vmean):   2.35 cm AV Area (VTI):     2.46 cm AV Vmax:           201.40 cm/s AV Vmean:          137.800 cm/s AV VTI:            0.361 m AV Peak Grad:      16.2 mmHg AV Mean Grad:      8.8 mmHg LVOT Vmax:         131.00 cm/s LVOT Vmean:        93.600 cm/s LVOT VTI:          0.256 m LVOT/AV VTI ratio: 0.71  AORTA Ao Root diam: 3.50 cm Ao Asc diam:  2.80 cm MITRAL VALVE                TRICUSPID VALVE MV Area (PHT): 3.48 cm     TR Peak grad:   35.8 mmHg MV Decel Time: 218 msec     TR Vmax:        299.00 cm/s MV E velocity: 123.00 cm/s MV A velocity: 91.20 cm/s   SHUNTS MV E/A ratio:  1.35         Systemic VTI:  0.26 m                             Systemic Diam: 2.10 cm Lyman Bishop MD Electronically signed  by Lyman Bishop MD Signature Date/Time: 06/21/2021/4:05:10 PM    Final      Gerrit Heck, MD 06/22/2021, 7:42 AM PGY-1, [place] Intern pager: [phone], text pages welcome

## 2021-06-22 NOTE — Progress Notes (Signed)
[facility] for Infectious Disease  Date of Admission:  06/15/2021   Total days of inpatient antibiotics 3  Principal Problem:   MSSA bacteremia Active Problems:   Non-small cell carcinoma of lung, stage 4 (HCC)   Thrombophlebitis arm          Assessment: 81 year old male with NSCLC stage IV on alectinib admitted for dyspnea secondary to progressive malignancy/pleural edema.  Patient developed fever on 5/23 with WBC 17 K blood cultures returned positive for MSSA.   #Hospital associated MSSA bacteremia 2/2 thrombophlebitis #NSCLC Stage IV #LLE hematoma and DVT -Left medial antecubital PIV placed on 5/15. Left UE IV placed 5/19 and removed 5/23.  He developed temp of 101.3 on HOD5 with wbc 17.2. Blood Cx on 5/23+ MSSA. As pt has erythema and tenderness at left antecubital PIV site suspect this is the source of bacteremia.  -TTE did not show vegetation, given short time interval between IV line/thrombophlebitis and MRSA bacteremia no further evaluation needed.  -[place] LUE showed superficial thrombus -Treat with 4 weeks of antibiotics for endovascular infection(bacteremia 2/2 thrombophlebitis).  Recommendations: --Place PICC -Continue cefazolin to complete 4 weeks of antibiotics -Follow up with ID -Follow repeat blood Cx to ensure clearance  OPAT ORDERS:  Diagnosis: MSSA bacteremia  Culture Result: Blood Cx+MSSA  Allergies  Allergen Reactions   Ace Inhibitors     REACTION: Cough     Discharge antibiotics to be given via PICC line:  Per pharmacy protocol cefazolin 1gm q24h Aim for Vancomycin trough 15-20 or AUC 400-550 (unless otherwise indicated)   Duration: 4 weeks End Date: 6/21  [facility] Care Per Protocol with [employer] Use: Home health RN for IV administration and teaching, line care and labs.    Labs weekly while on IV antibiotics: __ CBC with differential __ CMP __ CRP __ ESR  __ Please pull PIC at completion of IV antibiotics   Fax weekly labs  to [phone]  Clinic Follow Up Appt: 7/17  @ [facility] with dR. [place]    Microbiology:   Antibiotics: Cefazolin 5/24-p Cefepime 5/24 Vancomycin 5/24 Cultures: Blood 5/23 2/2 MSSA 5/25 pending    SUBJECTIVE: Sitting in chair. Family at bedside Interval: Afebrile overnight. Wbc 9.5K  Review of Systems: Review of Systems  All other systems reviewed and are negative.   Scheduled Meds:  alectinib  600 mg Oral BID WC   busPIRone  5 mg Oral BID   chewing gum (ORBIT) sugar free  1 Stick Oral Daily   clopidogrel  75 mg Oral Daily   ferrous sulfate  325 mg Oral Q breakfast   potassium chloride  40 mEq Oral BID   ramelteon  8 mg Oral QHS   rosuvastatin  5 mg Oral Daily   Continuous Infusions:   ceFAZolin (ANCEF) IV 1 g (06/22/21 1413)   PRN Meds:.acetaminophen, guaiFENesin, HYDROcodone-acetaminophen, ipratropium-albuterol, polyethylene glycol, pregabalin Allergies  Allergen Reactions   Ace Inhibitors     REACTION: Cough    OBJECTIVE: Vitals:   06/21/21 2100 06/22/21 0550 06/22/21 0812 06/22/21 1413  BP: (!) 107/50 (!) 109/50 (!) 83/57 (!) 113/49  Pulse: 78 73  71  Resp: _0 Temp: (!) 97.5 F (36.4 C) 97.8 F (36.6 C) 97.6 F (36.4 C)   TempSrc: Oral Oral Oral   SpO2: 100% 99%  100%  Weight:  70.6 kg    Height:       Body mass index is 21.71 kg/m.  Physical  Exam Constitutional:      General: He is not in acute distress.    Appearance: He is normal weight. He is not toxic-appearing.  HENT:     Head: Normocephalic and atraumatic.     Right Ear: External ear normal.     Left Ear: External ear normal.     Nose: No congestion or rhinorrhea.     Mouth/Throat:     Mouth: Mucous membranes are moist.     Pharynx: Oropharynx is clear.  Eyes:     Extraocular Movements: Extraocular movements intact.     Conjunctiva/sclera: Conjunctivae normal.     Pupils: Pupils are equal, round, and reactive to light.  Cardiovascular:     Rate and Rhythm: Normal  rate and regular rhythm.     Heart sounds: No murmur heard.   No friction rub. No gallop.  Pulmonary:     Effort: Pulmonary effort is normal.     Breath sounds: Normal breath sounds.  Abdominal:     General: Abdomen is flat. Bowel sounds are normal.     Palpations: Abdomen is soft.  Musculoskeletal:        General: No swelling. Normal range of motion.     Cervical back: Normal range of motion and neck supple.  Skin:    General: Skin is warm and dry.  Neurological:     General: No focal deficit present.     Mental Status: He is oriented to person, place, and time.  Psychiatric:        Mood and Affect: Mood normal.      Lab Results Lab Results  Component Value Date   WBC 9.5 06/22/2021   HGB 8.4 (L) 06/22/2021   HCT 25.0 (L) 06/22/2021   MCV 86.2 06/22/2021   PLT 318 06/22/2021    Lab Results  Component Value Date   CREATININE 6.30 (H) 06/22/2021   BUN 63 (H) 06/22/2021   NA 135 06/22/2021   K 3.5 06/22/2021   CL 97 (L) 06/22/2021   CO2 24 06/22/2021    Lab Results  Component Value Date   ALT 13 06/15/2021   AST 21 06/15/2021   ALKPHOS 86 06/15/2021   BILITOT 1.4 (H) 06/15/2021        Laurice Record, [facility] Infectious [facility] 06/22/2021, 2:50 PM

## 2021-06-22 NOTE — Care Management Important Message (Signed)
Important Message  Patient Details  Name: Arthur [name] MRN: [id number] Date of Birth: 01/21/1941   Medicare Important Message Given:  Yes     [name] 06/22/2021, 9:43 AM

## 2021-06-23 ENCOUNTER — Inpatient Hospital Stay: Payer: Self-pay

## 2021-06-23 LAB — RENAL FUNCTION PANEL
Albumin: 2.2 g/dL — ABNORMAL LOW (ref 3.5–5.0)
Albumin: 2.3 g/dL — ABNORMAL LOW (ref 3.5–5.0)
Anion gap: 14 (ref 5–15)
Anion gap: 11 (ref 5–15)
BUN: 55 mg/dL — ABNORMAL HIGH (ref 8–23)
BUN: 53 mg/dL — ABNORMAL HIGH (ref 8–23)
CO2: 26 mmol/L (ref 22–32)
CO2: 29 mmol/L (ref 22–32)
Calcium: 8.6 mg/dL — ABNORMAL LOW (ref 8.9–10.3)
Calcium: 8.5 mg/dL — ABNORMAL LOW (ref 8.9–10.3)
Chloride: 100 mmol/L (ref 98–111)
Chloride: 99 mmol/L (ref 98–111)
Creatinine, Ser: 5.19 mg/dL — ABNORMAL HIGH (ref 0.61–1.24)
Creatinine, Ser: 4.75 mg/dL — ABNORMAL HIGH (ref 0.61–1.24)
GFR, Estimated: 11 mL/min — ABNORMAL LOW (ref >60)
GFR, Estimated: 12 mL/min — ABNORMAL LOW (ref >60)
Glucose, Bld: 107 mg/dL — ABNORMAL HIGH (ref 70–99)
Glucose, Bld: 160 mg/dL — ABNORMAL HIGH (ref 70–99)
Phosphorus: 5.3 mg/dL — ABNORMAL HIGH (ref 2.5–4.6)
Phosphorus: 4.6 mg/dL (ref 2.5–4.6)
Potassium: 3.3 mmol/L — ABNORMAL LOW (ref 3.5–5.1)
Potassium: 3.2 mmol/L — ABNORMAL LOW (ref 3.5–5.1)
Sodium: 140 mmol/L (ref 135–145)
Sodium: 139 mmol/L (ref 135–145)

## 2021-06-23 LAB — CBC
HCT: 26.3 % — ABNORMAL LOW (ref 39.0–52.0)
Hemoglobin: 8.7 g/dL — ABNORMAL LOW (ref 13.0–17.0)
MCH: 28.1 pg (ref 26.0–34.0)
MCHC: 33.1 g/dL (ref 30.0–36.0)
MCV: 84.8 fL (ref 80.0–100.0)
Platelets: 386 10*3/uL (ref 150–400)
RBC: 3.1 MIL/uL — ABNORMAL LOW (ref 4.22–5.81)
RDW: 17 % — ABNORMAL HIGH (ref 11.5–15.5)
WBC: 9 10*3/uL (ref 4.0–10.5)
nRBC: 0 % (ref 0.0–0.2)

## 2021-06-23 MED ORDER — POTASSIUM CHLORIDE CRYS ER 20 MEQ PO TBCR
20.0000 meq | EXTENDED_RELEASE_TABLET | Freq: Three times a day (TID) | ORAL | Status: AC
  Administered 2021-06-23 (×3): 20 meq via ORAL
  Filled 2021-06-23 (×3): qty 1

## 2021-06-23 NOTE — Progress Notes (Signed)
Family Medicine Teaching Service Daily Progress Note Intern Pager: [phone]  Patient name: Arthur [name] Medical record number: [id number] Date of birth: 10-08-1940 Age: 81 y.o. Gender: male  Primary Care Provider: Lind Covert, MD Consultants: Nephro, VVS, ID, metabolic Code Status: Full  Pt Overview and Major Events to Date:  5/19: Admitted with dyspnea, found to have profound AKI 5/20: Hemoglobin downtrending, dyspnea improved entirely 5/22: VVS-no hematoma surgical intervention; Buspar added; Lasix increased to 160 mg Q8H   Assessment and Plan:  [name] is an 81 year old male presenting with dyspnea likely multifactorial, due to progression of NSCLC. History significant for CAD s/p stent (January),  pulmonary embolism  (January), DVT on apixaban->enoxaparin, LLE hematoma requiring transfusion and IVC filter placement (April), and NSCLC (Stage IV) on alectinib   MSSA bacteremia,resolved Patient has been afebrile overnight, intermittently soft pressures due to 90s over 40s.  Repeat blood cultures show no growth in 2 days.  -Plan for PICC line today -IV cefazolin for 4 weeks -Per ID does not need TEE -Monitor vitals  Acute renal failure in setting of CKD 3 AA  contrast-induced ATN Creatinine continues to downtrend today it is 5.19 from 5.89 yesterday. -Nephrology following, appreciate recommendations -Continue to trend creatinine  Dyspnea likely secondary to progression of NSCLC/pulmonary edema in setting of worsening anemia/renal function,resolving Does not have any hypoxia overnight and dyspnea has greatly improved -Monitor -DuoNebs every 4 hours as needed -Mucinex  Normocytic anemia Hgb stable at 8.7 today.  Around his baseline. -Monitor on CBC  Chronic/stable History of DVT and PE-inability to Anticoagulate due to hematoma/anemia Recent stroke-Plavix and Crestor Anxiety-BuSpar 5 mg twice daily Left foot drop-PT Hematoma-resolving on recent CT  scan, no surgical intervention required per AVS  FEN/GI: Renal diet with fluid restriction PPx: SCDs Dispo: Home with home health after PICC placed, pending renal fn   Subjective:  Family denies any acute issues would like hospital bed for patient at home and awaiting PICC line.   Objective: Temp:  [97.7 F (36.5 C)] 97.7 F (36.5 C) (05/26 2241) Pulse Rate:  [69-74] 69 (05/27 0414) Resp:  [15-16] 15 (05/27 0414) BP: (98-114)/(44-50) 98/44 (05/27 0414) SpO2:  [95 %-100 %] 95 % (05/27 0414) Weight:  [69.4 kg] 69.4 kg (05/27 0414) Physical Exam: General: NAD, laying in bed comfortable alert and responsive to all questions Cardiovascular: RRR no m/r/g Respiratory: CTAB no w/r/c Abdomen: Soft, nontender, nondistended Extremities: No LE edema  Laboratory: Recent Labs  Lab 06/21/21 0626 06/22/21 0602 06/23/21 0609  WBC 13.1* 9.5 9.0  HGB 8.7* 8.4* 8.7*  HCT 26.2* 25.0* 26.3*  PLT 283 318 386   Recent Labs  Lab 06/22/21 0602 06/22/21 1633 06/23/21 0609  NA 135 135 140  K 3.5 2.9* 3.3*  CL 97* 96* 100  CO2 24 26 26   BUN 63* 63* 55*  CREATININE 6.30* 5.89* 5.19*  CALCIUM 8.1* 7.9* 8.6*  GLUCOSE 106* 179* 107*    Imaging/Diagnostic Tests: No results found.   Gerrit Heck, MD 06/23/2021, 8:56 AM PGY-1, [facility] Intern pager: [phone], text pages welcome

## 2021-06-23 NOTE — Progress Notes (Signed)
Patient ID: Arthur [name], male   DOB: [birth year], 81 y.o.   MRN: [id number] [facility] Progress Note   Assessment/ Plan:   1. Acute kidney Injury on chronic kidney disease stage IIIa: Baseline creatinine ranging 1.2-1.5 and developed acute kidney injury from what appears to be primarily contrast nephropathy +/- ATN.  With good urine output overnight and improvement of BUN/creatinine seen indicative of ongoing renal recovery.  No acute indications for dialysis 2.  Hypokalemia: Status post treatment for hyperkalemia currently off of Lokelma.  Replacing/supplementing via oral route. 3.  MSSA bacteremia: Afebrile and without leukocytosis but with notable hypotension.  Continue intravenous cefazolin. 4.  Dyspnea: With history of non-small cell lung cancer/pulmonary edema-previously on chemotherapy with Alectinib. 5.  Anemia: Likely multifactorial with underlying malignancy current acute/critical illness.  Avoid ESA.  Subjective:   Denies any acute complaints, continues to feels weak and appreciates physical therapy.   Objective:   BP (!) 98/44 (BP Location: Right Arm)   Pulse 69   Temp 97.7 F (36.5 C) (Oral)   Resp 15   Ht 5\' 11"  (1.803 m)   Wt 69.4 kg   SpO2 95%   BMI 21.34 kg/m   Intake/Output Summary (Last 24 hours) at 06/23/2021 1022 Last data filed at 06/23/2021 6644 Gross per 24 hour  Intake --  Output 1920 ml  Net -1920 ml   Weight change: -1.2 kg  Physical Exam: Gen: Resting comfortably in bed, without apparent distress.  Daughter at bedside CVS: Pulse regular rhythm, normal rate, S1 and S2 normal Resp: With decreased breath sounds over bases, no rales/rhonchi Abd: Soft, flat, nontender Ext: Bilateral compression stockings in place, no pedal edema  Imaging: ECHOCARDIOGRAM COMPLETE  Result Date: 06/21/2021    ECHOCARDIOGRAM REPORT   Patient Name:   Arthur [name] [facility] Date of Exam: 06/21/2021 Medical Rec #:  [id number]         Height:       71.0 in  Accession #:    [id number]        Weight:       164.5 lb Date of Birth:  21-Jul-1940          BSA:          1.940 m Patient Age:    32 years          BP:           114/44 mmHg Patient Gender: M                 HR:           73 bpm. Exam Location:  Inpatient Procedure: 2D Echo, 3D Echo, Cardiac Doppler and Color Doppler Indications:    R01.1 Murmur. Bacteremia.  History:        Patient has prior history of Echocardiogram examinations, most                 recent 02/04/2021. CAD, Signs/Symptoms:Bacteremia and Murmur; Risk                 Factors:Dyslipidemia, Hypertension and Diabetes. Pulmonary                 embolus. Metastatic lung cancer.  Sonographer:    Roseanna Rainbow RDCS Referring Phys: [id number] CARINA M BROWN  Sonographer Comments: Technically difficult study due to poor echo windows and suboptimal parasternal window. IMPRESSIONS  1. Left ventricular ejection fraction, by estimation, is 65 to 70%. Left ventricular ejection fraction by 2D MOD biplane is  70.2 %. The left ventricle has normal function. The left ventricle has no regional wall motion abnormalities. There is moderate asymmetric left ventricular hypertrophy of the basal-septal segment. Left ventricular diastolic function could not be evaluated.  2. Right ventricular systolic function is normal. The right ventricular size is normal. There is moderately elevated pulmonary artery systolic pressure. The estimated right ventricular systolic pressure is 25.9 mmHg.  3. Right atrial size was mildly dilated.  4. The pericardial effusion is anterior to the right ventricle.  5. The mitral valve is grossly normal. No evidence of mitral valve regurgitation.  6. Heavy calcification on the tip of the non-coronary cusp of the aortic valve. The aortic valve is calcified. Aortic valve regurgitation is trivial. Aortic valve sclerosis/calcification is present, without any evidence of aortic stenosis. Aortic valve mean gradient measures 8.8 mmHg.  7. The inferior vena cava is  dilated in size with <50% respiratory variability, suggesting right atrial pressure of 15 mmHg.  8. Rhythm strip during this exam demostrated atrial fibrillation. Comparison(s): Changes from prior study are noted. 03/18/2021: LVEF 55-60%, severe ASH, small RV anterior pericardial effusion. Conclusion(s)/Recommendation(s): No evidence of valvular vegetations on this transthoracic echocardiogram. Consider a transesophageal echocardiogram to exclude infective endocarditis if clinically indicated. FINDINGS  Left Ventricle: Left ventricular ejection fraction, by estimation, is 65 to 70%. Left ventricular ejection fraction by 2D MOD biplane is 70.2 %. The left ventricle has normal function. The left ventricle has no regional wall motion abnormalities. 3D left ventricular ejection fraction analysis performed but not reported based on interpreter judgement due to suboptimal tracking. The left ventricular internal cavity size was normal in size. There is moderate asymmetric left ventricular hypertrophy of the basal-septal segment. Left ventricular diastolic function could not be evaluated due to atrial fibrillation. Left ventricular diastolic function could not be evaluated. Right Ventricle: The right ventricular size is normal. No increase in right ventricular wall thickness. Right ventricular systolic function is normal. There is moderately elevated pulmonary artery systolic pressure. The tricuspid regurgitant velocity is 2.99 m/s, and with an assumed right atrial pressure of 15 mmHg, the estimated right ventricular systolic pressure is 56.3 mmHg. Left Atrium: Left atrial size was normal in size. Right Atrium: Right atrial size was mildly dilated. Pericardium: Trivial pericardial effusion is present. The pericardial effusion is anterior to the right ventricle. Mitral Valve: The mitral valve is grossly normal. No evidence of mitral valve regurgitation. Tricuspid Valve: The tricuspid valve is grossly normal. Tricuspid valve  regurgitation is trivial. Aortic Valve: Heavy calcification on the tip of the non-coronary cusp of the aortic valve. The aortic valve is calcified. Aortic valve regurgitation is trivial. Aortic valve sclerosis/calcification is present, without any evidence of aortic stenosis. Aortic valve mean gradient measures 8.8 mmHg. Aortic valve peak gradient measures 16.2 mmHg. Aortic valve area, by VTI measures 2.46 cm. Pulmonic Valve: The pulmonic valve was normal in structure. Pulmonic valve regurgitation is not visualized. Aorta: The aortic root and ascending aorta are structurally normal, with no evidence of dilitation. Venous: The inferior vena cava is dilated in size with less than 50% respiratory variability, suggesting right atrial pressure of 15 mmHg. IAS/Shunts: No atrial level shunt detected by color flow Doppler. EKG: Rhythm strip during this exam demostrated atrial fibrillation.  LEFT VENTRICLE PLAX 2D                        Biplane EF (MOD) LVIDd:         4.70 cm  LV Biplane EF:   Left LVIDs:         3.10 cm                          ventricular LV PW:         0.90 cm                          ejection LV IVS:        0.80 cm                          fraction by LVOT diam:     2.10 cm                          2D MOD LV SV:         89                               biplane is LV SV Index:   46                               70.2 %. LVOT Area:     3.46 cm                                Diastology                                LV e' medial:    9.57 cm/s LV Volumes (MOD)               LV E/e' medial:  12.9 LV vol d, MOD    72.1 ml       LV e' lateral:   9.36 cm/s A2C:                           LV E/e' lateral: 13.1 LV vol d, MOD    53.9 ml A4C: LV vol s, MOD    16.5 ml A2C: LV vol s, MOD    19.9 ml       3D Volume EF: A4C:                           3D EF:        59 % LV SV MOD A2C:   55.6 ml       LV EDV:       102 ml LV SV MOD A4C:   53.9 ml       LV ESV:       42 ml LV SV MOD BP:    44.4 ml       LV SV:         60 ml RIGHT VENTRICLE             IVC RV S prime:     12.65 cm/s  IVC diam: 2.20 cm TAPSE (M-mode): 1.7 cm LEFT ATRIUM             Index        RIGHT ATRIUM  Index LA diam:        3.40 cm 1.75 cm/m   RA Area:     20.60 cm LA Vol (A2C):   46.2 ml 23.81 ml/m  RA Volume:   62.60 ml  32.26 ml/m LA Vol (A4C):   45.6 ml 23.50 ml/m LA Biplane Vol: 46.6 ml 24.02 ml/m  AORTIC VALVE AV Area (Vmax):    2.25 cm AV Area (Vmean):   2.35 cm AV Area (VTI):     2.46 cm AV Vmax:           201.40 cm/s AV Vmean:          137.800 cm/s AV VTI:            0.361 m AV Peak Grad:      16.2 mmHg AV Mean Grad:      8.8 mmHg LVOT Vmax:         131.00 cm/s LVOT Vmean:        93.600 cm/s LVOT VTI:          0.256 m LVOT/AV VTI ratio: 0.71  AORTA Ao Root diam: 3.50 cm Ao Asc diam:  2.80 cm MITRAL VALVE                TRICUSPID VALVE MV Area (PHT): 3.48 cm     TR Peak grad:   35.8 mmHg MV Decel Time: 218 msec     TR Vmax:        299.00 cm/s MV E velocity: 123.00 cm/s MV A velocity: 91.20 cm/s   SHUNTS MV E/A ratio:  1.35         Systemic VTI:  0.26 m                             Systemic Diam: 2.10 cm Lyman Bishop MD Electronically signed by Lyman Bishop MD Signature Date/Time: 06/21/2021/4:05:10 PM    Final    [place] EKG SITE RITE  Result Date: 06/23/2021 If Site Rite image not attached, placement could not be confirmed due to current cardiac rhythm.   Labs: BMET Recent Labs  Lab 06/20/21 [phone] 06/20/21 1809 06/21/21 0626 06/21/21 1647 06/22/21 0602 06/22/21 1633 06/23/21 0609  NA 136 135 134* 135 135 135 140  K 4.3 4.2 3.2* 3.0* 3.5 2.9* 3.3*  CL 101 100 98 97* 97* 96* 100  CO2 23 20* 21* 24 24 26 26   GLUCOSE 108* 128* 96 191* 106* 179* 107*  BUN 65* 67* 68* 67* 63* 63* 55*  CREATININE 7.84* 7.38* 7.40* 6.78* 6.30* 5.89* 5.19*  CALCIUM 8.1* 7.8* 8.1* 8.0* 8.1* 7.9* 8.6*  PHOS 8.5* 7.9* 8.2* 7.6* 6.8* 5.7* 5.3*   CBC Recent Labs  Lab 06/17/21 0444 06/18/21 0620 06/20/21 0619 06/21/21 0626  06/22/21 0602 06/23/21 0609  WBC 7.7   < > 17.2* 13.1* 9.5 9.0  NEUTROABS 5.3  --   --   --   --   --   HGB 9.0*   < > 8.5* 8.7* 8.4* 8.7*  HCT 27.1*   < > 26.3* 26.2* 25.0* 26.3*  MCV 88.6   < > 88.3 87.3 86.2 84.8  PLT 274   < > 280 283 318 386   < > = values in this interval not displayed.    Medications:     alectinib  600 mg Oral BID WC   busPIRone  5 mg Oral BID   chewing gum (ORBIT) sugar free  1 Stick Oral Daily   clopidogrel  75 mg Oral Daily   ferrous sulfate  325 mg Oral Q breakfast   potassium chloride  20 mEq Oral TID   ramelteon  8 mg Oral QHS   rosuvastatin  5 mg Oral Daily      Elmarie Shiley, MD 06/23/2021, 10:22 AM

## 2021-06-23 NOTE — Progress Notes (Signed)
FPTS Brief Progress Note  S:Patient sleeping comfortably   O: BP (!) 114/50 (BP Location: Right Arm)   Pulse 74   Temp 97.7 F (36.5 C) (Oral)   Resp 16   Ht 5\' 11"  (1.803 m)   Wt 70.6 kg   SpO2 98%   BMI 21.71 kg/m     A/P:  MSSA bacteremia PICC placed 5/28 after cultures negative x 48 hrs with 4 weeks cefazolin   - Orders reviewed. Labs for AM ordered, which was adjusted as needed.   Continue current management   Shary Key, DO 06/23/2021, 1:25 AM PGY-2, [facility] Night Resident  Please page [phone] with questions.

## 2021-06-24 LAB — RENAL FUNCTION PANEL
Albumin: 2.3 g/dL — ABNORMAL LOW (ref 3.5–5.0)
Anion gap: 10 (ref 5–15)
BUN: 50 mg/dL — ABNORMAL HIGH (ref 8–23)
CO2: 27 mmol/L (ref 22–32)
Calcium: 8.5 mg/dL — ABNORMAL LOW (ref 8.9–10.3)
Chloride: 101 mmol/L (ref 98–111)
Creatinine, Ser: 4.12 mg/dL — ABNORMAL HIGH (ref 0.61–1.24)
GFR, Estimated: 14 mL/min — ABNORMAL LOW (ref >60)
Glucose, Bld: 119 mg/dL — ABNORMAL HIGH (ref 70–99)
Phosphorus: 4.1 mg/dL (ref 2.5–4.6)
Potassium: 3.3 mmol/L — ABNORMAL LOW (ref 3.5–5.1)
Sodium: 138 mmol/L (ref 135–145)

## 2021-06-24 LAB — CBC
HCT: 26.7 % — ABNORMAL LOW (ref 39.0–52.0)
Hemoglobin: 9 g/dL — ABNORMAL LOW (ref 13.0–17.0)
MCH: 28.6 pg (ref 26.0–34.0)
MCHC: 33.7 g/dL (ref 30.0–36.0)
MCV: 84.8 fL (ref 80.0–100.0)
Platelets: 389 10*3/uL (ref 150–400)
RBC: 3.15 MIL/uL — ABNORMAL LOW (ref 4.22–5.81)
RDW: 17.1 % — ABNORMAL HIGH (ref 11.5–15.5)
WBC: 8 10*3/uL (ref 4.0–10.5)
nRBC: 0 % (ref 0.0–0.2)

## 2021-06-24 MED ORDER — CHLORHEXIDINE GLUCONATE CLOTH 2 % EX PADS
6.0000 | MEDICATED_PAD | Freq: Every day | CUTANEOUS | Status: DC
  Administered 2021-06-24 – 2021-06-25 (×2): 6 via TOPICAL

## 2021-06-24 MED ORDER — SODIUM CHLORIDE 0.9% FLUSH: 10.0000 mL | INTRAVENOUS | Status: DC | PRN

## 2021-06-24 MED ORDER — SODIUM CHLORIDE 0.9% FLUSH
10.0000 mL | Freq: Two times a day (BID) | INTRAVENOUS | Status: DC
  Administered 2021-06-24 – 2021-06-25 (×3): 10 mL

## 2021-06-24 MED ORDER — POTASSIUM CHLORIDE CRYS ER 20 MEQ PO TBCR
20.0000 meq | EXTENDED_RELEASE_TABLET | Freq: Three times a day (TID) | ORAL | Status: AC
  Administered 2021-06-24 (×3): 20 meq via ORAL
  Filled 2021-06-24 (×3): qty 1

## 2021-06-24 MED ORDER — CEFAZOLIN SODIUM-DEXTROSE 1-4 GM/50ML-% IV SOLN
1.0000 g | Freq: Two times a day (BID) | INTRAVENOUS | Status: DC
  Administered 2021-06-24 – 2021-06-25 (×3): 1 g via INTRAVENOUS
  Filled 2021-06-24 (×4): qty 50

## 2021-06-24 MED ORDER — POTASSIUM CHLORIDE 20 MEQ PO PACK
40.0000 meq | PACK | Freq: Once | ORAL | Status: AC
  Administered 2021-06-24: 40 meq via ORAL
  Filled 2021-06-24: qty 2

## 2021-06-24 NOTE — Progress Notes (Signed)
Paged Dr. Posey Pronto with nephrology to clarify placing a PICC line is acceptable. Waiting for a response.

## 2021-06-24 NOTE — Progress Notes (Signed)
FPTS Brief Progress Note  S: patient sleeping   O: BP (!) 112/46 (BP Location: Right Arm)   Pulse 70   Temp (!) 97.5 F (36.4 C) (Oral)   Resp 15   Ht 5\' 11"  (1.803 m)   Wt 69.4 kg   SpO2 99%   BMI 21.34 kg/m     A/P: - Orders reviewed. Labs for AM ordered, which was adjusted as needed.   Gladys Damme, MD 06/24/2021, 2:15 AM PGY-3, [facility] Night Resident  Please page [phone] with questions.

## 2021-06-24 NOTE — Progress Notes (Signed)
PHARMACY CONSULT NOTE FOR:  OUTPATIENT  PARENTERAL ANTIBIOTIC THERAPY (OPAT)  Indication: bacteremia/cellulitis Regimen: cefazolin IV 1g Q12H End date: 07/19/2021  IV antibiotic discharge orders are pended. To discharging provider:  please sign these orders via discharge navigator,  Select New Orders & click on the button choice - Manage This Unsigned Work.     Thank you for allowing pharmacy to be a part of this patient's care.  Shauna Hugh, PharmD, [place]  PGY-2 Pharmacy Resident 06/24/2021 12:00 PM  Please check [email or web address] for unit-specific pharmacy phone numbers.

## 2021-06-24 NOTE — Progress Notes (Signed)
Secure chat received from Dr. Posey Pronto, nephrology and Dr. Candiss Norse, [place]. Received clarification to move forward with PICC placement.

## 2021-06-24 NOTE — Progress Notes (Signed)
Family Medicine Teaching Service Daily Progress Note Intern Pager: [phone]  Patient name: Arthur [name] Medical record number: [id number] Date of birth: [birth year] Age: 81 y.o. Gender: male  Primary Care Provider: Lind Covert, MD Consultants: none  Code Status: FULL   Pt Overview and Major Events to Date:  Arthur [name] is a 81 y.o. male presenting with dyspnea 2/2 . PMH is significant for stage IV lung cancer currently on oral chemotherapy, hx PE/DVT on Plavis, hx CVA, CAD s/p stenting, CKD, hyperlipidemia, GERD.  Assessment and Plan:  MSSA bacteremia, resolved Repeat blood cultures on 5/25 showed no growth at 2 days -PICC line today -IV cefazolin for 4 weeks total -ID following, appreciate recommendations -Monitor fever curve  Dyspnea, likely multifactorial- Non-small cell lung carcinoma with mets, likely PE, fluid overload in the setting of contrast-induced ATN, stable -Monitor respiratory status -DuoNebs as needed -Mucinex -Continue chemotherapy Alecensa 600 mg twice daily  Acute renal failure in setting of's CKD stage IIIa, last induced ATN Creatinine 4.12 this morning, downtrending from 4.75 yesterday -Nephrology following appreciate recommended -Monitor with BMP  Normocytic anemia, stable  Hb 9 this morning, baseline appears to be 8-9. -Monitor CBC -Continue ferrous sulfate 325 mg daily  Pain, stable  -Continue Norco 5 mg every 6 as needed  Chronic/stable conditions  History of DVT and PE, unable to anticoagulate due to history of hematoma Recent CVA-continue Plavix and Crestor Anxiety-continue BuSpar 5 mg twice daily Left foot drop-ongoing physical therapy Hematoma, resolving on recent CT scan, no surgical intervention required per vascular surgery  FEN/GI: Renal diet with fluid restriction PPx: SCDs Dispo:Home with home health  tomorrow. Barriers include hospital bed.   Subjective:  Pt is doing well this morning, no acute concerns today.  Does not have much of an appetite.  Objective: Temp:  [97.5 F (36.4 C)-98.2 F (36.8 C)] 97.8 F (36.6 C) (05/28 1241) Pulse Rate:  [70-72] 72 (05/28 1241) Resp:  [12-15] 14 (05/28 1241) BP: (110-118)/(46-54) 118/53 (05/28 1241) SpO2:  [95 %-100 %] 100 % (05/28 1241) Weight:  [66.7 kg] 66.7 kg (05/28 0433)   Physical Exam: General: Alert, no acute distress, frail appearing  Cardio: Normal S1 and S2, RRR, no r/m/g Pulm: CTAB, normal work of breathing Abdomen: Bowel sounds normal. Abdomen soft and non-tender.  Extremities: No peripheral edema.  Neuro: Cranial nerves grossly intact   Laboratory: Recent Labs  Lab 06/22/21 0602 06/23/21 0609 06/24/21 0252  WBC 9.5 9.0 8.0  HGB 8.4* 8.7* 9.0*  HCT 25.0* 26.3* 26.7*  PLT 318 386 389   Recent Labs  Lab 06/23/21 0609 06/23/21 1651 06/24/21 0252  NA 140 139 138  K 3.3* 3.2* 3.3*  CL 100 99 101  CO2 26 29 27   BUN 55* 53* 50*  CREATININE 5.19* 4.75* 4.12*  CALCIUM 8.6* 8.5* 8.5*  GLUCOSE 107* 160* 119*      Imaging/Diagnostic Tests: [place] EKG SITE RITE  Result Date: 06/23/2021 If Site Rite image not attached, placement could not be confirmed due to current cardiac rhythm.    Lattie Haw, MD 06/24/2021, 2:50 PM PGY-3, [place] Intern pager: [phone], text pages welcome

## 2021-06-24 NOTE — Progress Notes (Signed)
Patient ID: Arthur [name], male   DOB: 1940-05-03, 81 y.o.   MRN: [id number] [facility] Progress Note   Assessment/ Plan:   1. Acute kidney Injury on chronic kidney disease stage IIIa: Baseline creatinine ranging 1.2-1.5 and developed acute kidney injury from what appears to be primarily contrast nephropathy +/- ATN.  Urine output incompletely charted but continued improvement of BUN and creatinine noted that are indicative of ongoing renal recovery. 2.  Hypokalemia: Earlier on treatment for hyperkalemia and was placed on renal diet/Lokelma.  Currently replacing/supplementing potassium via oral route and will switch to regular diet from renal diet. 3.  MSSA bacteremia: Afebrile and without leukocytosis but with notable hypotension.  Continue intravenous cefazolin. 4.  Dyspnea: With history of non-small cell lung cancer/pulmonary edema-previously on chemotherapy with Alectinib. 5.  Anemia: Likely multifactorial with underlying malignancy current acute/critical illness.  Avoid ESA.  Subjective:   Denies any acute complaints, continues to have generalized weakness and working closely with physical therapy.  Meal selections limited, will switch to regular diet.   Objective:   BP (!) 112/51 (BP Location: Right Arm)   Pulse 72   Temp (!) 97.5 F (36.4 C) (Oral)   Resp 12   Ht 5\' 11"  (1.803 m)   Wt 66.7 kg   SpO2 95%   BMI 20.51 kg/m   Intake/Output Summary (Last 24 hours) at 06/24/2021 0946 Last data filed at 06/24/2021 2863 Gross per 24 hour  Intake 960 ml  Output 575 ml  Net 385 ml   Weight change: -2.7 kg  Physical Exam: Gen: Comfortably resting in bed, daughter at bedside CVS: Pulse regular rhythm, normal rate, S1 and S2 normal Resp: With decreased breath sounds over bases, no rales/rhonchi Abd: Soft, flat, nontender Ext: Bilateral compression stockings in place, no pedal edema  Imaging: [place] EKG SITE RITE  Result Date: 06/23/2021 If Site Rite image not  attached, placement could not be confirmed due to current cardiac rhythm.   Labs: BMET Recent Labs  Lab 06/21/21 0626 06/21/21 1647 06/22/21 0602 06/22/21 1633 06/23/21 0609 06/23/21 1651 06/24/21 0252  NA 134* 135 135 135 140 139 138  K 3.2* 3.0* 3.5 2.9* 3.3* 3.2* 3.3*  CL 98 97* 97* 96* 100 99 101  CO2 21* 24 24 26 26 29 27   GLUCOSE 96 191* 106* 179* 107* 160* 119*  BUN 68* 67* 63* 63* 55* 53* 50*  CREATININE 7.40* 6.78* 6.30* 5.89* 5.19* 4.75* 4.12*  CALCIUM 8.1* 8.0* 8.1* 7.9* 8.6* 8.5* 8.5*  PHOS 8.2* 7.6* 6.8* 5.7* 5.3* 4.6 4.1   CBC Recent Labs  Lab 06/21/21 0626 06/22/21 0602 06/23/21 0609 06/24/21 0252  WBC 13.1* 9.5 9.0 8.0  HGB 8.7* 8.4* 8.7* 9.0*  HCT 26.2* 25.0* 26.3* 26.7*  MCV 87.3 86.2 84.8 84.8  PLT 283 318 386 389    Medications:     alectinib  600 mg Oral BID WC   busPIRone  5 mg Oral BID   chewing gum (ORBIT) sugar free  1 Stick Oral Daily   clopidogrel  75 mg Oral Daily   ferrous sulfate  325 mg Oral Q breakfast   potassium chloride  20 mEq Oral TID   ramelteon  8 mg Oral QHS   rosuvastatin  5 mg Oral Daily      Elmarie Shiley, MD 06/24/2021, 9:46 AM

## 2021-06-24 NOTE — Progress Notes (Signed)
Peripherally Inserted Central Catheter Placement  The IV Nurse has discussed with the patient and/or persons authorized to consent for the patient, the purpose of this procedure and the potential benefits and risks involved with this procedure.  The benefits include less needle sticks, lab draws from the catheter, and the patient may be discharged home with the catheter. Risks include, but not limited to, infection, bleeding, blood clot (thrombus formation), and puncture of an artery; nerve damage and irregular heartbeat and possibility to perform a PICC exchange if needed/ordered by physician.  Alternatives to this procedure were also discussed.  Bard Power PICC patient education guide, fact sheet on infection prevention and patient information card has been provided to patient /or left at bedside. Daughter at bedside with pt and explained procedure and is agreeable to PICC. Pt signed consent.  PICC Placement Documentation  PICC Single Lumen 47/82/95 Right Basilic 41 cm 1 cm (Active)  Indication for Insertion or Continuance of Line Home intravenous therapies (PICC only) 06/24/21 1205  Exposed Catheter (cm) 1 cm 06/24/21 1205  Site Assessment Clean, Dry, Intact 06/24/21 1205  Line Status Flushed;Saline locked;Blood return noted 06/24/21 1205  Dressing Type Transparent;Securing device 06/24/21 1205  Dressing Status Antimicrobial disc in place 06/24/21 1205  Safety Lock Not Applicable 62/13/08 6578  Line Care Connections checked and tightened 06/24/21 1205  Line Adjustment (NICU/IV Team Only) No 06/24/21 1205  Dressing Intervention New dressing 06/24/21 1205  Dressing Change Due 07/01/21 06/24/21 [place] 06/24/2021, 12:06 PM

## 2021-06-25 LAB — RENAL FUNCTION PANEL
Albumin: 2.3 g/dL — ABNORMAL LOW (ref 3.5–5.0)
Anion gap: 8 (ref 5–15)
BUN: 46 mg/dL — ABNORMAL HIGH (ref 8–23)
CO2: 27 mmol/L (ref 22–32)
Calcium: 8.6 mg/dL — ABNORMAL LOW (ref 8.9–10.3)
Chloride: 104 mmol/L (ref 98–111)
Creatinine, Ser: 3.37 mg/dL — ABNORMAL HIGH (ref 0.61–1.24)
GFR, Estimated: 18 mL/min — ABNORMAL LOW (ref >60)
Glucose, Bld: 126 mg/dL — ABNORMAL HIGH (ref 70–99)
Phosphorus: 2.9 mg/dL (ref 2.5–4.6)
Potassium: 3.9 mmol/L (ref 3.5–5.1)
Sodium: 139 mmol/L (ref 135–145)

## 2021-06-25 MED ORDER — BUSPIRONE HCL 5 MG PO TABS: 5.0000 mg | ORAL_TABLET | Freq: Two times a day (BID) | ORAL | 0 refills | Status: DC

## 2021-06-25 MED ORDER — RAMELTEON 8 MG PO TABS: 8.0000 mg | ORAL_TABLET | Freq: Every day | ORAL | 0 refills | Status: DC

## 2021-06-25 MED ORDER — ROSUVASTATIN CALCIUM 5 MG PO TABS: 5.0000 mg | ORAL_TABLET | Freq: Every day | ORAL | 0 refills | Status: DC

## 2021-06-25 MED ORDER — GUAIFENESIN 100 MG/5ML PO LIQD: 5.0000 mL | ORAL | 0 refills | Status: DC | PRN

## 2021-06-25 NOTE — Progress Notes (Signed)
FPTS Brief Progress Note  S:Patient sleeping   O: BP 124/60 (BP Location: Left Arm)   Pulse 78   Temp 98.3 F (36.8 C) (Oral)   Resp 17   Ht 5\' 11"  (1.803 m)   Wt 66.7 kg   SpO2 100%   BMI 20.51 kg/m     A/P: PICC line placed. Anticipate discharge later today if DME/HH arranged  - Orders reviewed. Labs for AM ordered, which was adjusted as needed.    Shary Key, DO 06/25/2021, 1:39 AM PGY-2, [facility] Night Resident  Please page [phone] with questions.

## 2021-06-25 NOTE — Progress Notes (Signed)
Physical Therapy Treatment Patient Details Name: Arthur [name] MRN: [id number] DOB: 12-17-1940 Today's Date: 06/25/2021   History of Present Illness 81 yo male adm 5/19 with SOB. Rapid worsening of met disease with AKI. PMhx: stage 4 lung CA, type 2 DM, CAD s/p PCI, HLD, HTN, gout, GERD, prior PE    PT Comments    Pt pleasant with daughter present and ted hose on. Pt continues to struggle with activity tolerance with progressive decline in ambulation distance and ability to clear leg off of bed since admission. Encouraged mobilizing to bathroom at least 1x/day, performing HEP and OOB to chair daily. Will increase freq acutely to 3x to assist with deconditioning. Family educated for importance of HEP at home as well.   SpO2 95-98% on RA    Recommendations for follow up therapy are one component of a multi-disciplinary discharge planning process, led by the attending physician.  Recommendations may be updated based on patient status, additional functional criteria and insurance authorization.  Follow Up Recommendations  Home health PT     Assistance Recommended at Discharge Frequent or constant Supervision/Assistance  Patient can return home with the following Assistance with cooking/housework;Assist for transportation;A little help with bathing/dressing/bathroom;A little help with walking and/or transfers   Equipment Recommendations  None recommended by PT    Recommendations for Other Services       Precautions / Restrictions Precautions Precautions: Fall;Other (comment) Precaution Comments: watch sats, left foot drop     Mobility  Bed Mobility Overal bed mobility: Needs Assistance Bed Mobility: Supine to Sit     Supine to sit: Min assist, HOB elevated     General bed mobility comments: HOB 20 degrees with increased time and assist to clear legs from surface with increasing difficulty moving legs off bed since admission    Transfers Overall transfer level: Needs  assistance   Transfers: Sit to/from Stand Sit to Stand: Min guard           General transfer comment: cues for hand placement with bed height slightly elevated to mimic home with increased time to rise    Ambulation/Gait Ambulation/Gait assistance: Min guard Gait Distance (Feet): 30 Feet Assistive device: Rolling walker (2 wheels) Gait Pattern/deviations: Step-through pattern, Trunk flexed, Decreased dorsiflexion - left   Gait velocity interpretation: <1.8 ft/sec, indicate of risk for recurrent falls   General Gait Details: pt with left foot drop, reliance on RW and denied attempting increased gait with fatigue limiting function to walking in room only   Stairs             Wheelchair Mobility    Modified Rankin (Stroke Patients Only)       Balance Overall balance assessment: Needs assistance Sitting-balance support: No upper extremity supported Sitting balance-Leahy Scale: Good Sitting balance - Comments: static sitting without UE support   Standing balance support: Bilateral upper extremity supported Standing balance-Leahy Scale: Poor Standing balance comment: standing with RW                            Cognition Arousal/Alertness: Awake/alert Behavior During Therapy: Flat affect Overall Cognitive Status: Impaired/Different from baseline                           Safety/Judgement: Decreased awareness of deficits              Exercises General Exercises - Lower Extremity Long Arc Quad: AROM, Both, Seated,  20 reps Hip Flexion/Marching: AAROM, Both, Seated, 20 reps    General Comments        Pertinent Vitals/Pain Pain Assessment Pain Assessment: No/denies pain    Home Living                          Prior Function            PT Goals (current goals can now be found in the care plan section) Progress towards PT goals: Progressing toward goals    Frequency    Min 3X/week      PT Plan Current plan  remains appropriate;Frequency needs to be updated    Co-evaluation              AM-PAC PT "6 Clicks" Mobility   Outcome Measure  Help needed turning from your back to your side while in a flat bed without using bedrails?: A Little Help needed moving from lying on your back to sitting on the side of a flat bed without using bedrails?: A Little Help needed moving to and from a bed to a chair (including a wheelchair)?: A Little Help needed standing up from a chair using your arms (e.g., wheelchair or bedside chair)?: A Little Help needed to walk in hospital room?: A Little Help needed climbing 3-5 steps with a railing? : Total 6 Click Score: 16    End of Session Equipment Utilized During Treatment: Gait belt Activity Tolerance: Patient limited by fatigue Patient left: in chair;with call bell/phone within reach;with family/visitor present Nurse Communication: Mobility status PT Visit Diagnosis: Difficulty in walking, not elsewhere classified (R26.2);Muscle weakness (generalized) (M62.81)     Time: 1103-1594 PT Time Calculation (min) (ACUTE ONLY): 26 min  Charges:  $Gait Training: 8-22 mins $Therapeutic Exercise: 8-22 mins                     [place], PT Acute Rehabilitation Services Pager: [phone] Office: [place] 06/25/2021, 10:58 AM

## 2021-06-25 NOTE — Discharge Summary (Signed)
[facility] Discharge Summary  Patient name: Arthur [name] Medical record number: [id number] Date of birth: [birth year] Age: 81 y.o. Gender: male Date of Admission: 06/15/2021  Date of Discharge: 06/26/21 Admitting Physician: Leeanne Rio, MD  Primary Care Provider: Lind Covert, MD Consultants: Nephrology, infectious disease  Indication for Hospitalization: Shortness of breath, acute renal failure  Discharge Diagnoses/Problem List:  Acute renal failure secondary to contrast-induced ATN MSSA bacteremia Normocytic anemia Left foot drop Left thigh hematoma Stage IV non-small cell lung cancer  Disposition: Home with home health  Discharge Condition: Stable  Discharge Exam:  Temp:  [98.2 F (36.8 C)-98.3 F (36.8 C)] 98.2 F (36.8 C) (05/29 0528) Pulse Rate:  [77-78] 77 (05/29 0528) Resp:  [13-17] 13 (05/29 0528) BP: (120-124)/(48-60) 120/48 (05/29 0528) SpO2:  [95 %-100 %] 96 % (05/29 1036) Weight:  [65.3 kg] 65.3 kg (05/29 0528) Physical Exam: General: Awake, alert, oriented, no acute distress Cardiovascular: Regular rate and rhythm, no murmurs Respiratory: CTAB  Brief Hospital Course:  Arthur [name] is an 81 year old male admitted for shortness of breath and acute renal failure.  PMH includes stage IV non-small cell lung cancer, DVT, LLE hematoma requiring transfusion, IVC filter (April 2023), CAD s/p stent, PE.  Hospital course is as follows:  SOB likely 2/2 worsening lung malignancy/pulmonary edema from renal failure/anemia  Hx PE s/p IVC filter 05/09/2021 Sudden onset SOB on admission. Non-Con CT chest showed progression of malignant disease w/ slight enlargement of RLL nodule and rapidly progressive pleural metastases bilaterally (R>L) and no evidence of PE. Oncology was consulted and believed unlikely to have significant disease progression of lung cancer within 4 days on very effective treatment-plan for PET scan  outpatient. Alectinib was resumed. CXR showed pulmonary edema. [name] shortness of breath improved with diuretics and likely 2/2 to renal failure. Anemia remained stable (discussed below).    MSSA bacteremia Blood cx positive for MSSA on 5/24. Pt also became febrile to 101. Vanc and cefpime started (5/24). ID consulted who switched patient to cefazolin (5/24-). Echo showed 65-70% EF without significant valvular issues. ID did not recommend TEE. Nidus likely 2/2 to PIV site on left arm that developed warm, erythematous and tender site. Upper extremity doppler showed superficial thrombophlebitis. Repeat cultures were negative for >48 hours and PICC line was ordered as ID recommended Cefazolin for 4 weeks.   Acute renal failure 2/2 to contrast induced ATN Cr 4.73 on admission with baseline around 1's (Got as high as 6.13 in [place]). Recent CT with contrast likely provoking factor causing ATN. IVF was intiated and nephology was consulted. Nephrology provided bicarbonate with fluids and then transitioned to lasix given patients fluid overload.  FeNa was 1.4%, consistent with ATN. Renal ultrasound showed no hydronephrosis.  ATN entered diuretic phase and lasix was stopped. With supportive management creatinine at discharge was 3.37.   Left foot drop Sensation lost on left foot and unable to dorsiflex or plantarflex. MRI femur showed large well-defined fluid collection within the posterior consistent with a hematoma and Small-moderate-sized Baker's cyst. MRI lumbar spine showed marked canal stenosis at L4-L5 with  effacement of subarticular recesses and crowding of the cauda equina nerve roots. Foraminal narrowing is also greatest at L4-L5. Findings on exam most consistent with superficial peroneal nerve issue. PT recommended home health PT/OT.  Normocytic anemia  Likely due to recent blood loss (thigh hematoma while on enoxaparin) and chronic disease. LDH 220, haptoglobin <10, Fe of 16. Iron supplement  started inpatient.  Bilateral DVT  Hematoma  Status post IVC filter placement Currently off anticoagulation with Lovenox because of the bleeding issues in the lower extremities in past.  He is only on treatment with Plavix. Iron tablet was started inpatient. VVS recommended compression stockings, foot drop splint and patient had no signs of compartment syndrome and there were no surgical interventions recommended.   Discharge recommendations for follow up Continued goals of care discussions Recommend monitoring anemia given hematoma in LE through CBC Monitor renal function on BMP Ensure continuing cefazolin for 4 weeks Outpatient PET scan per oncology  Significant Procedures: None  Significant Labs and Imaging:  Recent Labs  Lab 06/22/21 0602 06/23/21 0609 06/24/21 0252  WBC 9.5 9.0 8.0  HGB 8.4* 8.7* 9.0*  HCT 25.0* 26.3* 26.7*  PLT 318 386 389   Recent Labs  Lab 06/22/21 1633 06/23/21 0609 06/23/21 1651 06/24/21 0252 06/25/21 0559  NA 135 140 139 138 139  K 2.9* 3.3* 3.2* 3.3* 3.9  CL 96* 100 99 101 104  CO2 $Re'26 26 29 27 27  'UsT$ GLUCOSE 179* 107* 160* 119* 126*  BUN 63* 55* 53* 50* 46*  CREATININE 5.89* 5.19* 4.75* 4.12* 3.37*  CALCIUM 7.9* 8.6* 8.5* 8.5* 8.6*  PHOS 5.7* 5.3* 4.6 4.1 2.9  ALBUMIN 2.3* 2.2* 2.3* 2.3* 2.3*   PORTABLE CHEST 1 VIEW 06/15/21 FINDINGS: Cardiac shadow is within normal limits. Lungs are well aerated bilaterally. No focal infiltrate or sizable effusion is noted. Old healed rib fractures on the left are noted. No acute bony abnormality is seen. IMPRESSION: No acute abnormality noted.  CT CHEST WITHOUT CONTRAST 06/15/21 IMPRESSION: 1. Today's study demonstrates progression of disease, with slight interval enlargement of the previously noted right lower lobe pulmonary nodule, and what appears to be rapidly progressive pleural metastases bilaterally (right greater than left), as above. Diffuse osseous metastatic disease appears grossly  unchanged. 2. Mild cardiomegaly. 3. Aortic atherosclerosis, in addition to left main and three-vessel coronary artery disease. 4. There are calcifications of the aortic valve. Echocardiographic correlation for evaluation of potential valvular dysfunction may be warranted if clinically indicated. 5. Mild diffuse bronchial wall thickening with very mild centrilobular and paraseptal emphysema; imaging findings suggestive of underlying COPD.  RENAL / URINARY TRACT ULTRASOUND COMPLETE 06/15/21 IMPRESSION: Mildly increased parenchymal echogenicity bilaterally suggestive of medical renal disease. No hydronephrosis.  MRI LUMBAR SPINE WITHOUT CONTRAST 06/15/21 IMPRESSION: Multilevel degenerative changes superimposed on congenital narrowing of the spinal canal. There is marked canal stenosis at L4-L5 with effacement of subarticular recesses and crowding of the cauda equina nerve roots. Foraminal narrowing is also greatest at L4-L5.   Scattered foci of abnormal signal corresponding to sclerotic metastases on prior imaging. The requested T11 level was inadvertently not included. However, there is no evidence of extraosseous extension on the prior CT abdomen.  MR OF THE LEFT FEMUR WITHOUT CONTRAST 06/15/21 IMPRESSION: 1. Large well-defined fluid collection within the posterior compartment of the mid left thigh measuring 23.0 x 4.9 x 6.5 cm. Appearance is most consistent with a hematoma. 2. No acute osseous abnormality.  No suspicious bone lesion. 3. Large right inguinal hernia containing bowel. 4. Posterior horn of the medial meniscus of the left knee is torn. 5. Small-moderate-sized Baker's cyst.  CHEST - 2 VIEW 06/17/21 IMPRESSION: Cardiomegaly, increasing interstitial opacities, and small pleural effusions are suggestive of pulmonary edema. Recommend clinical correlation.  PORTABLE CHEST 1 VIEW 06/19/21 IMPRESSION: 1. Stable exam without acute or active cardiopulmonary disease.  CT  OF THE LOWER LEFT EXTREMITY  WITHOUT CONTRAST 06/20/21 IMPRESSION: 1. Stable to slightly decreased size of the previously seen left thigh posterior compartment complex fluid collection, likely an evolving hematoma. 2. No acute fracture. Old healed proximal left fibular diaphyseal fracture. 3. Small Baker's cyst.   Results/Tests Pending at Time of Discharge: None  Discharge Medications:  Allergies as of 06/25/2021       Reactions   Ace Inhibitors    REACTION: Cough   Iohexol    Severe ATN after CT with contrast May 2023 - creatinine peaked near 8. Did not require HD.        Medication List     STOP taking these medications    allopurinol 100 MG tablet Commonly known as: ZYLOPRIM   colchicine 0.6 MG tablet   diclofenac Sodium 1 % Gel Commonly known as: Voltaren   empagliflozin 10 MG Tabs tablet Commonly known as: JARDIANCE   losartan 25 MG tablet Commonly known as: COZAAR   metFORMIN 1000 MG tablet Commonly known as: GLUCOPHAGE       TAKE these medications    acetaminophen 650 MG CR tablet Commonly known as: TYLENOL Take 650 mg by mouth every 8 (eight) hours as needed for pain.   Alecensa 150 MG capsule Generic drug: alectinib Take 4 capsules (600 mg total) by mouth 2 (two) times daily with a meal.   busPIRone 5 MG tablet Commonly known as: BUSPAR Take 1 tablet (5 mg total) by mouth 2 (two) times daily.   ceFAZolin  IVPB Commonly known as: ANCEF Inject 1 g into the vein daily for 27 days. Indication:  bacteremia/cellulits First Dose: Yes Last Day of Therapy:  07/19/2021 Labs - Once weekly:  CBC/D and BMP, Labs - Every other week:  ESR and CRP Method of administration: IV Push Method of administration may be changed at the discretion of home infusion pharmacist based upon assessment of the patient and/or caregiver's ability to self-administer the medication ordered.   clopidogrel 75 MG tablet Commonly known as: PLAVIX Take 1 tablet (75 mg total) by  mouth daily.   ferrous sulfate 324 (65 Fe) MG Tbec Take 324 mg by mouth daily.   guaiFENesin 100 MG/5ML liquid Commonly known as: ROBITUSSIN Take 5 mLs by mouth every 4 (four) hours as needed for cough or to loosen phlegm.   HYDROcodone-acetaminophen 5-325 MG tablet Commonly known as: NORCO/VICODIN Take 1 tablet by mouth every 6 (six) hours as needed for moderate pain.   polyethylene glycol powder 17 GM/SCOOP powder Commonly known as: GLYCOLAX/MIRALAX Take 17 g by mouth daily.   pregabalin 50 MG capsule Commonly known as: LYRICA Take 50 mg by mouth 2 (two) times daily as needed (pain). As needed   ramelteon 8 MG tablet Commonly known as: ROZEREM Take 1 tablet (8 mg total) by mouth at bedtime.   rosuvastatin 5 MG tablet Commonly known as: CRESTOR Take 1 tablet (5 mg total) by mouth daily. What changed:  medication strength how much to take   trolamine salicylate 10 % cream Commonly known as: Asper-Flex Apply 1 application topically as needed for muscle pain.               Durable Medical Equipment  (From admission, onward)           Start     Ordered   06/25/21 1359  For home use only DME Overbed table  Once       Comments: General Weakness-Need for HB use.   06/25/21 1359   06/25/21 1002  For home use only DME Hospital bed  Once       Question Answer Comment  Length of Need Lifetime   Patient has (list medical condition): Stage 4 lung cancer, generalized weakness, limited mobility, large hematoma of left thigh, impaired cognition   The above medical condition requires: Patient requires the ability to reposition frequently   Bed type Semi-electric      06/25/21 1003              Discharge Care Instructions  (From admission, onward)           Start     Ordered   06/22/21 0000  Change dressing on IV access line weekly and PRN  (Home infusion instructions - [facility] )        06/22/21 1046           Discharge  Instructions: Please refer to Patient Instructions section of EMR for full details.  Patient was counseled important signs and symptoms that should prompt return to medical care, changes in medications, dietary instructions, activity restrictions, and follow up appointments.   Follow-Up Appointments:  Follow-up Information     Wells Guiles, DO. Go on 07/04/2021.   Specialty: Family Medicine Why: At 9:45 am. Please arrive by 9:30 am. This is your hospital follow up appointment with the family medicine appointment. If this day and time does not work well for you, please call the clinic directly to reschedule. Contact information: [place] [address] [phone]         Lind Covert, MD. Go on 07/17/2021.   Specialty: Family Medicine Why: At 10:30 am. Please arrive by 10:15 am. This is an appointment with your PCP to ensure you are doing well on your home antibiotics. If this day and time does not work well for you, please call the clinic directly to reschedule. Contact information: [place] [address] [phone]         Buford Dresser, MD .   Specialty: Cardiology Contact information: [address] [place] [address] [place], [place] Follow up.   Specialty: Home Health Services Why: Registered Nurse, Physical Therapy-office to call with visit times. Contact information: [place] [address] [phone]         Gean Quint, MD. Schedule an appointment as soon as possible for a visit.   Specialty: Nephrology Why: Call and make a nephrology follow up appointment. You saw Dr. Candiss Norse in the hospital. Contact information: [place] [address] [phone]                Ezequiel Essex, MD 06/25/2021, 4:03 PM PGY-2, [place]

## 2021-06-25 NOTE — Progress Notes (Signed)
Occupational Therapy Treatment Patient Details Name: Arthur [name] MRN: [id number] DOB: 05/13/1940 Today's Date: 06/25/2021   History of present illness 81 yo male adm 5/19 with SOB. Rapid worsening of met disease with AKI. PMhx: stage 4 lung CA, type 2 DM, CAD s/p PCI, HLD, HTN, gout, GERD, prior PE   OT comments  Patient received in recliner with family in room. Patient performed UE HEP with level one therapy band with min verbal cues to perform correctly. Patient asked to address grooming standing at sink and patient stated he walked with PT earlier. Patient instructed in importance in mobility and increasing independence with self care tasks. Patient agreed and ambulated to sink with RW and increased time. Patient declined standing at sink due to fatigue and required increased time before returning to recliner. Patient would benefit from continued OT services to increase activity tolerance and independence with self care tasks.    Recommendations for follow up therapy are one component of a multi-disciplinary discharge planning process, led by the attending physician.  Recommendations may be updated based on patient status, additional functional criteria and insurance authorization.    Follow Up Recommendations  Home health OT    Assistance Recommended at Discharge Intermittent Supervision/Assistance  Patient can return home with the following  A little help with walking and/or transfers;A little help with bathing/dressing/bathroom;Assistance with cooking/housework;Help with stairs or ramp for entrance   Equipment Recommendations  None recommended by OT    Recommendations for Other Services      Precautions / Restrictions Precautions Precautions: Fall;Other (comment) Precaution Comments: watch sats, left foot drop Restrictions Weight Bearing Restrictions: No       Mobility Bed Mobility Overal bed mobility: Needs Assistance             General bed mobility comments:  seated in recliner upon entry and at end of session    Transfers Overall transfer level: Needs assistance Equipment used: Rolling walker (2 wheels) Transfers: Sit to/from Stand Sit to Stand: Min guard           General transfer comment: cues for hand placement to power up and to reach back to sit for safety     Balance Overall balance assessment: Needs assistance Sitting-balance support: No upper extremity supported Sitting balance-Leahy Scale: Good Sitting balance - Comments: seated in recliner   Standing balance support: Bilateral upper extremity supported Standing balance-Leahy Scale: Poor Standing balance comment: reliant on RW                           ADL either performed or assessed with clinical judgement   ADL Overall ADL's : Needs assistance/impaired     Grooming: Set up;Sitting                                 General ADL Comments: Patient declined standing at sink due to fatigue    Extremity/Trunk Assessment              Vision       Perception     Praxis      Cognition Arousal/Alertness: Awake/alert Behavior During Therapy: Flat affect Overall Cognitive Status: Impaired/Different from baseline                           Safety/Judgement: Decreased awareness of deficits  Exercises Exercises: General Upper Extremity General Exercises - Upper Extremity Shoulder Flexion: Strengthening, Right, 10 reps, Theraband, Seated Theraband Level (Shoulder Flexion): Level 1 (Yellow) Shoulder ABduction: Both, 10 reps, Strengthening, Theraband, Seated Theraband Level (Shoulder Abduction): Level 1 (Yellow) Elbow Flexion: Strengthening, Right, 10 reps, Theraband, Seated Theraband Level (Elbow Flexion): Level 1 (Yellow) Elbow Extension: Strengthening, Right, 10 reps, Theraband, Seated Theraband Level (Elbow Extension): Level 1 (Yellow)    Shoulder Instructions       General Comments      Pertinent  Vitals/ Pain       Pain Assessment Pain Assessment: No/denies pain Pain Intervention(s): Monitored during session  Home Living                                          Prior Functioning/Environment              Frequency  Min 2X/week        Progress Toward Goals  OT Goals(current goals can now be found in the care plan section)  Progress towards OT goals: Progressing toward goals  Acute Rehab OT Goals Patient Stated Goal: go home OT Goal Formulation: With patient/family Time For Goal Achievement: 07/01/21 Potential to Achieve Goals: Good ADL Goals Pt Will Perform Lower Body Dressing: with modified independence;sit to/from stand;sitting/lateral leans Pt Will Transfer to Toilet: with modified independence;ambulating Pt/caregiver will Perform Home Exercise Program: Increased strength;Both right and left upper extremity;With theraband;Independently;With written HEP provided Additional ADL Goal #1: Pt to verbalize at least 3 energy conservation strategies to implement during daily routine.  Plan Discharge plan remains appropriate    Co-evaluation                 AM-PAC OT "6 Clicks" Daily Activity     Outcome Measure   Help from another person eating meals?: None Help from another person taking care of personal grooming?: A Little Help from another person toileting, which includes using toliet, bedpan, or urinal?: A Little Help from another person bathing (including washing, rinsing, drying)?: A Little Help from another person to put on and taking off regular upper body clothing?: A Little Help from another person to put on and taking off regular lower body clothing?: A Little 6 Click Score: 19    End of Session Equipment Utilized During Treatment: Gait belt;Rolling walker (2 wheels)  OT Visit Diagnosis: Unsteadiness on feet (R26.81);Other abnormalities of gait and mobility (R26.89)   Activity Tolerance Patient tolerated treatment  well;Patient limited by fatigue   Patient Left in chair;with call bell/phone within reach;with chair alarm set;with family/visitor present   Nurse Communication Mobility status        Time: 2641-5830 OT Time Calculation (min): 28 min  Charges: OT General Charges $OT Visit: 1 Visit OT Treatments $Therapeutic Activity: 8-22 mins $Therapeutic Exercise: 8-22 mins  Lodema Hong, OTA Acute Rehabilitation Services  Pager [phone] Office [place] 06/25/2021, 1:15 PM

## 2021-06-25 NOTE — Care Management (Signed)
    Durable Medical Equipment  (From admission, onward)           Start     Ordered   06/25/21 1002  For home use only DME Hospital bed  Once       Question Answer Comment  Length of Need Lifetime   Patient has (list medical condition): Stage 4 lung cancer, generalized weakness, limited mobility, large hematoma of left thigh, impaired cognition   The above medical condition requires: Patient requires the ability to reposition frequently   Bed type Semi-electric      06/25/21 1003

## 2021-06-25 NOTE — Progress Notes (Signed)
Family Medicine Teaching Service Daily Progress Note Intern Pager: [phone]  Patient name: Arthur [name] Medical record number: [id number] Date of birth: 1940/12/06 Age: 81 y.o. Gender: male  Primary Care Provider: Lind Covert, MD Consultants: Nephrology, infectious disease Code Status: Full  Pt Overview and Major Events to Date:  5/19: Admitted  Assessment and Plan: [name] is an 81 year old male admitted for shortness of breath and acute renal failure.  PMH includes stage IV non-small cell lung cancer, DVT, LLE hematoma requiring transfusion, IVC filter (April 2023), CAD s/p stent, PE.  MSSA bacteremia; improving Doing quite well on cefazolin IV.  PICC line placed yesterday.  Patient is set up for home IV antibiotic infusion with home health and [facility] pharmacy.  Patient is medically stable for discharge home with family support.   Acute renal failure in setting of's CKD stage IIIa  ATN Creatinine continues to downtrend, adequate urine output.  Nephrology signed off inpatient care, will arrange outpatient follow-up.   Normocytic anemia, stable  Stable   Pain Continue Norco   FEN/GI: Regular PPx: SCDs Dispo:Home with home health  today. Barriers include DME hospital bed in place at home.   Subjective:  [name] found working with occupational therapy, resting in a chair at bedside.  Daughter and grandson in room helping.  [name] reports feeling well and is very encouraged that his creatinine is improving.  He is ready to get out of the hospital.  Daughter reports they are ready to accept him in soon as they get the hospital bed in place.  Objective: Temp:  [98.2 F (36.8 C)-98.3 F (36.8 C)] 98.2 F (36.8 C) (05/29 0528) Pulse Rate:  [77-78] 77 (05/29 0528) Resp:  [13-17] 13 (05/29 0528) BP: (120-124)/(48-60) 120/48 (05/29 0528) SpO2:  [95 %-100 %] 96 % (05/29 1036) Weight:  [65.3 kg] 65.3 kg (05/29 0528) Physical Exam: General: Awake, alert,  oriented, no acute distress Cardiovascular: Regular rate and rhythm, no murmurs Respiratory: CTAB  Laboratory: Recent Labs  Lab 06/22/21 0602 06/23/21 0609 06/24/21 0252  WBC 9.5 9.0 8.0  HGB 8.4* 8.7* 9.0*  HCT 25.0* 26.3* 26.7*  PLT 318 386 389   Recent Labs  Lab 06/23/21 1651 06/24/21 0252 06/25/21 0559  NA 139 138 139  K 3.2* 3.3* 3.9  CL 99 101 104  CO2 29 27 27   BUN 53* 50* 46*  CREATININE 4.75* 4.12* 3.37*  CALCIUM 8.5* 8.5* 8.6*  GLUCOSE 160* 119* 126*   Imaging/Diagnostic Tests: None last 24 hours.   Ezequiel Essex, MD 06/25/2021, 2:47 PM PGY-2, [place] Intern pager: [phone], text pages welcome

## 2021-06-25 NOTE — Progress Notes (Signed)
Patient ID: Arthur [name], male   DOB: 10/26/1940, 81 y.o.   MRN: [id number] [facility] Progress Note   Assessment/ Plan:   1. Acute kidney Injury on chronic kidney disease stage IIIa: Baseline creatinine ranging 1.2-1.5 and developed acute kidney injury from what appears to be primarily contrast nephropathy +/- ATN.  Kidney function continues to improve, remains nonoliguric. Continue with supportive care. Nothing else to add from a nephrology perspective. Will sign off and will assist in arranging outpatient follow. Please call with any questions/concerns. 2.  Hypokalemia: Earlier on treatment for hyperkalemia and was placed on renal diet/Lokelma.  Currently replacing/supplementing potassium via oral route and will switch to regular diet from renal diet. K wnl today 3.  MSSA bacteremia: Afebrile and without leukocytosis but with notable hypotension.  On intravenous cefazolin. 4.  Dyspnea: With history of non-small cell lung cancer/pulmonary edema-previously on chemotherapy with Alectinib. 5.  Anemia: Likely multifactorial with underlying malignancy current acute/critical illness.  Avoid ESA.  Subjective:   No acute events/complaints. He reports that he is waiting for his hospital bed to come in before he goes home. Uop ~1325cc   Objective:   BP (!) 120/48 (BP Location: Left Arm)   Pulse 77   Temp 98.2 F (36.8 C) (Oral)   Resp 13   Ht 5\' 11"  (1.803 m)   Wt 65.3 kg   SpO2 96%   BMI 20.08 kg/m   Intake/Output Summary (Last 24 hours) at 06/25/2021 1408 Last data filed at 06/25/2021 0102 Gross per 24 hour  Intake 424.33 ml  Output 1325 ml  Net -900.67 ml   Weight change: -1.382 kg  Physical Exam: Gen: Comfortably sitting up in chair, daughter at bedside CVS: Pulse regular rhythm, normal rate, S1 and S2 normal Resp: With decreased breath sounds over bases, no rales/rhonchi Abd: Soft, flat, nontender Ext: Bilateral compression stockings in place, no pedal  edema  Imaging: No results found.  Labs: BMET Recent Labs  Lab 06/21/21 1647 06/22/21 0602 06/22/21 1633 06/23/21 0609 06/23/21 1651 06/24/21 0252 06/25/21 0559  NA 135 135 135 140 139 138 139  K 3.0* 3.5 2.9* 3.3* 3.2* 3.3* 3.9  CL 97* 97* 96* 100 99 101 104  CO2 24 24 26 26 29 27 27   GLUCOSE 191* 106* 179* 107* 160* 119* 126*  BUN 67* 63* 63* 55* 53* 50* 46*  CREATININE 6.78* 6.30* 5.89* 5.19* 4.75* 4.12* 3.37*  CALCIUM 8.0* 8.1* 7.9* 8.6* 8.5* 8.5* 8.6*  PHOS 7.6* 6.8* 5.7* 5.3* 4.6 4.1 2.9   CBC Recent Labs  Lab 06/21/21 0626 06/22/21 0602 06/23/21 0609 06/24/21 0252  WBC 13.1* 9.5 9.0 8.0  HGB 8.7* 8.4* 8.7* 9.0*  HCT 26.2* 25.0* 26.3* 26.7*  MCV 87.3 86.2 84.8 84.8  PLT 283 318 386 389    Medications:     alectinib  600 mg Oral BID WC   busPIRone  5 mg Oral BID   chewing gum (ORBIT) sugar free  1 Stick Oral Daily   Chlorhexidine Gluconate Cloth  6 each Topical Daily   clopidogrel  75 mg Oral Daily   ferrous sulfate  325 mg Oral Q breakfast   ramelteon  8 mg Oral QHS   rosuvastatin  5 mg Oral Daily   sodium chloride flush  10-40 mL Intracatheter Q12H      Gean Quint, MD [facility] 06/25/2021, 2:08 PM

## 2021-06-25 NOTE — TOC Progression Note (Addendum)
Transition of Care [facility]) - Progression Note    Patient Details  Name: MALIN CERVINI MRN: [id number] Date of Birth: 03-05-1940  [facility] CM/SW Contact  Graves-Bigelow, Ocie Cornfield, RN Phone Number: 06/25/2021, 10:31 AM  Clinical Narrative:  Case Manager received update in progression that the patient needs a hospital bed for home. Order was not submitted over the weekend and submitted this am to [employer]. [employer] may be able to deliver the DME today-office is checking to see if the drivers can deliver. Case Manager did contact [facility] and they can see the patient on tomorrow. [facility] has delivered the medication and is aware patient may discharge today vs tomorrow depending on bed situation. Case Manager following.    [place]  06-25-21 Case Manager received a phone call from the daughter and the hospital bed has arrived. Family opted not to pay for the overbed table at an out of pocket cost. Son will transport the patient home via private vehicle. No further needs identified.   Expected Discharge Plan: [place] Barriers to Discharge: No Barriers Identified  Expected Discharge Plan and Services Expected Discharge Plan: [place] In-house Referral: NA Discharge Planning Services: CM Consult Post Acute Care Choice: Home Health, Resumption of Svcs/PTA Provider Living arrangements for the past 2 months: Single Family Home                 DME Arranged: Hospital bed DME Agency: [facility] Date DME Agency Contacted: 06/25/21 Time DME Agency Contacted: 0930 Representative spoke with at DME Agency: [place]: RN, Disease Management, IV Antibiotics, PT [place] Agency: [facility] Date [place]: 06/22/21 Time [place]: 1150 Representative spoke with at [place]: Anderson [place]  Readmission Risk Interventions    06/19/2021    4:57 PM  Readmission Risk Prevention Plan  Transportation Screening Complete  PCP or  Specialist Appt within 3-5 Days Complete  HRI or [place] Complete  Social Work Consult for [place] Planning/Counseling Complete  Medication Review Press photographer) Complete

## 2021-06-26 ENCOUNTER — Ambulatory Visit: Payer: Medicare Other | Admitting: Student

## 2021-06-26 DIAGNOSIS — I10 Essential (primary) hypertension: Secondary | ICD-10-CM | POA: Diagnosis not present

## 2021-06-26 DIAGNOSIS — I2510 Atherosclerotic heart disease of native coronary artery without angina pectoris: Secondary | ICD-10-CM | POA: Diagnosis not present

## 2021-06-26 DIAGNOSIS — I252 Old myocardial infarction: Secondary | ICD-10-CM | POA: Diagnosis not present

## 2021-06-26 DIAGNOSIS — M1990 Unspecified osteoarthritis, unspecified site: Secondary | ICD-10-CM | POA: Diagnosis not present

## 2021-06-26 DIAGNOSIS — Z9181 History of falling: Secondary | ICD-10-CM | POA: Diagnosis not present

## 2021-06-26 DIAGNOSIS — C3491 Malignant neoplasm of unspecified part of right bronchus or lung: Secondary | ICD-10-CM | POA: Diagnosis not present

## 2021-06-26 DIAGNOSIS — D630 Anemia in neoplastic disease: Secondary | ICD-10-CM | POA: Diagnosis not present

## 2021-06-26 DIAGNOSIS — Z7902 Long term (current) use of antithrombotics/antiplatelets: Secondary | ICD-10-CM | POA: Diagnosis not present

## 2021-06-26 DIAGNOSIS — I69354 Hemiplegia and hemiparesis following cerebral infarction affecting left non-dominant side: Secondary | ICD-10-CM | POA: Diagnosis not present

## 2021-06-26 DIAGNOSIS — I824Z3 Acute embolism and thrombosis of unspecified deep veins of distal lower extremity, bilateral: Secondary | ICD-10-CM | POA: Diagnosis not present

## 2021-06-26 DIAGNOSIS — M109 Gout, unspecified: Secondary | ICD-10-CM | POA: Diagnosis not present

## 2021-06-26 DIAGNOSIS — E119 Type 2 diabetes mellitus without complications: Secondary | ICD-10-CM | POA: Diagnosis not present

## 2021-06-26 DIAGNOSIS — Z7984 Long term (current) use of oral hypoglycemic drugs: Secondary | ICD-10-CM | POA: Diagnosis not present

## 2021-06-26 LAB — CULTURE, BLOOD (ROUTINE X 2)
Culture: NO GROWTH
Culture: NO GROWTH
Special Requests: ADEQUATE

## 2021-06-29 DIAGNOSIS — M1990 Unspecified osteoarthritis, unspecified site: Secondary | ICD-10-CM | POA: Diagnosis not present

## 2021-06-29 DIAGNOSIS — D630 Anemia in neoplastic disease: Secondary | ICD-10-CM | POA: Diagnosis not present

## 2021-06-29 DIAGNOSIS — M109 Gout, unspecified: Secondary | ICD-10-CM | POA: Diagnosis not present

## 2021-06-29 DIAGNOSIS — C3491 Malignant neoplasm of unspecified part of right bronchus or lung: Secondary | ICD-10-CM | POA: Diagnosis not present

## 2021-06-29 DIAGNOSIS — I10 Essential (primary) hypertension: Secondary | ICD-10-CM | POA: Diagnosis not present

## 2021-06-29 DIAGNOSIS — E119 Type 2 diabetes mellitus without complications: Secondary | ICD-10-CM | POA: Diagnosis not present

## 2021-06-29 DIAGNOSIS — Z7984 Long term (current) use of oral hypoglycemic drugs: Secondary | ICD-10-CM | POA: Diagnosis not present

## 2021-06-29 DIAGNOSIS — I824Z3 Acute embolism and thrombosis of unspecified deep veins of distal lower extremity, bilateral: Secondary | ICD-10-CM | POA: Diagnosis not present

## 2021-06-29 DIAGNOSIS — I69354 Hemiplegia and hemiparesis following cerebral infarction affecting left non-dominant side: Secondary | ICD-10-CM | POA: Diagnosis not present

## 2021-06-29 DIAGNOSIS — Z9181 History of falling: Secondary | ICD-10-CM | POA: Diagnosis not present

## 2021-06-29 DIAGNOSIS — I252 Old myocardial infarction: Secondary | ICD-10-CM | POA: Diagnosis not present

## 2021-06-29 DIAGNOSIS — Z7902 Long term (current) use of antithrombotics/antiplatelets: Secondary | ICD-10-CM | POA: Diagnosis not present

## 2021-06-29 DIAGNOSIS — I2510 Atherosclerotic heart disease of native coronary artery without angina pectoris: Secondary | ICD-10-CM | POA: Diagnosis not present

## 2021-07-02 ENCOUNTER — Telehealth: Payer: Self-pay | Admitting: Internal Medicine

## 2021-07-02 DIAGNOSIS — I69354 Hemiplegia and hemiparesis following cerebral infarction affecting left non-dominant side: Secondary | ICD-10-CM | POA: Diagnosis not present

## 2021-07-02 DIAGNOSIS — M109 Gout, unspecified: Secondary | ICD-10-CM | POA: Diagnosis not present

## 2021-07-02 DIAGNOSIS — Z9181 History of falling: Secondary | ICD-10-CM | POA: Diagnosis not present

## 2021-07-02 DIAGNOSIS — Z7902 Long term (current) use of antithrombotics/antiplatelets: Secondary | ICD-10-CM | POA: Diagnosis not present

## 2021-07-02 DIAGNOSIS — D630 Anemia in neoplastic disease: Secondary | ICD-10-CM | POA: Diagnosis not present

## 2021-07-02 DIAGNOSIS — Z7984 Long term (current) use of oral hypoglycemic drugs: Secondary | ICD-10-CM | POA: Diagnosis not present

## 2021-07-02 DIAGNOSIS — I10 Essential (primary) hypertension: Secondary | ICD-10-CM | POA: Diagnosis not present

## 2021-07-02 DIAGNOSIS — I252 Old myocardial infarction: Secondary | ICD-10-CM | POA: Diagnosis not present

## 2021-07-02 DIAGNOSIS — C3491 Malignant neoplasm of unspecified part of right bronchus or lung: Secondary | ICD-10-CM | POA: Diagnosis not present

## 2021-07-02 DIAGNOSIS — I824Z3 Acute embolism and thrombosis of unspecified deep veins of distal lower extremity, bilateral: Secondary | ICD-10-CM | POA: Diagnosis not present

## 2021-07-02 DIAGNOSIS — E119 Type 2 diabetes mellitus without complications: Secondary | ICD-10-CM | POA: Diagnosis not present

## 2021-07-02 DIAGNOSIS — R7881 Bacteremia: Secondary | ICD-10-CM | POA: Diagnosis not present

## 2021-07-02 DIAGNOSIS — M1990 Unspecified osteoarthritis, unspecified site: Secondary | ICD-10-CM | POA: Diagnosis not present

## 2021-07-02 DIAGNOSIS — I2510 Atherosclerotic heart disease of native coronary artery without angina pectoris: Secondary | ICD-10-CM | POA: Diagnosis not present

## 2021-07-02 NOTE — Telephone Encounter (Signed)
Called patient regarding upcoming June appointments, patient is notified.  

## 2021-07-03 ENCOUNTER — Encounter: Payer: Self-pay | Admitting: *Deleted

## 2021-07-04 ENCOUNTER — Ambulatory Visit (INDEPENDENT_AMBULATORY_CARE_PROVIDER_SITE_OTHER): Payer: Medicare Other | Admitting: Family Medicine

## 2021-07-04 ENCOUNTER — Encounter: Payer: Self-pay | Admitting: Student

## 2021-07-04 VITALS — BP 110/50 | HR 87 | Ht 71.0 in | Wt 162.0 lb

## 2021-07-04 DIAGNOSIS — R1909 Other intra-abdominal and pelvic swelling, mass and lump: Secondary | ICD-10-CM

## 2021-07-04 DIAGNOSIS — M21372 Foot drop, left foot: Secondary | ICD-10-CM

## 2021-07-04 DIAGNOSIS — D649 Anemia, unspecified: Secondary | ICD-10-CM

## 2021-07-04 DIAGNOSIS — I10 Essential (primary) hypertension: Secondary | ICD-10-CM | POA: Diagnosis not present

## 2021-07-04 DIAGNOSIS — N289 Disorder of kidney and ureter, unspecified: Secondary | ICD-10-CM | POA: Insufficient documentation

## 2021-07-04 DIAGNOSIS — E1169 Type 2 diabetes mellitus with other specified complication: Secondary | ICD-10-CM

## 2021-07-04 LAB — POCT GLYCOSYLATED HEMOGLOBIN (HGB A1C): HbA1c, POC (controlled diabetic range): 6.1 % (ref 0.0–7.0)

## 2021-07-04 MED ORDER — BUSPIRONE HCL 5 MG PO TABS: 5.0000 mg | ORAL_TABLET | Freq: Two times a day (BID) | ORAL | 1 refills | Status: DC

## 2021-07-04 NOTE — Assessment & Plan Note (Signed)
Thought to be secondary to hematoma compression.  Continue Physical Therapy.  Once finishes antibiotics for bacteremia and if his PET scan shows cancer is stable may investigate further

## 2021-07-04 NOTE — Assessment & Plan Note (Signed)
Acute. Likely primarily due to contrast injury.  Check bmet today.

## 2021-07-04 NOTE — Assessment & Plan Note (Signed)
Off all BP medications now.  Did have losartan in his medications but they do not think he was taking Continue to monitor blood pressure

## 2021-07-04 NOTE — Patient Instructions (Signed)
Good to see you today - Thank you for coming in  Things we discussed today:  Do not take the Losartan  Take the Ramelteon only if needed for sleep   Stop the Pregabalin   I will call you if your tests are not good.  Otherwise, I will send you a message on MyChart (if it is active) or a letter in the mail..  If you do not hear from me with in 2 weeks please call our office.     Please always bring your medication bottles  Keep your appointment with me in June

## 2021-07-04 NOTE — Assessment & Plan Note (Signed)
Consistent with reducible hernia.  Discussed warning signs of pain or vomiting

## 2021-07-04 NOTE — Assessment & Plan Note (Signed)
Clinically stable.  Check cbc today.   Will need to decide when to restart anticoagulation.  Will depend on hgb and PET scan results

## 2021-07-04 NOTE — Progress Notes (Signed)
  SUBJECTIVE:   CHIEF COMPLAINT / HPI:   Anemia: Patient was started on iron supplementation and hospitalization. No bleeding.  Feels stronger.  Walking with walker.     MSSA bacteremia: Discharged with cefazolin x4 weeks.Has been getting.  ID is following and drew labs on Monday.  Patient reports to end on 6/22  Acute renal failure 2/2 contrast-induced ATN: Discharged with creatinine of 3.37 baseline around 1s.   Not using any NSAIDs. His taste for water does not feel normal but he is drinking regularly  L Foot Drop - thinks is maybe a little better.  Getting Physical Therapy at home   L Leg Swelling - he feels this is improving.  No unusual shortness of breath   R Hernia - has intermittent swelling that reduces he lies down.  No pain or nausea and vomiting   PERTINENT  PMH / PSH: HTN, GERD, T2DM, HLD, MSSA bacteremia, DVT, PE, stage IV lung adenocarcinoma  OBJECTIVE:  BP (!) 110/50   Pulse 87   Ht 5\' 11"  (1.803 m)   Wt 162 lb (73.5 kg)   SpO2 97%   BMI 22.59 kg/m   Alert interactive  Son is present  R groin - large reducible hernia nontender L leg - complete foot drop on L with minimal flex exten of ankle.  Edema and induration of thigh and LL decreased Heart - Regular rate and rhythm.  No murmurs, gallops or rubs.     ASSESSMENT/PLAN:  Essential hypertension, benign Off all BP medications now.  Did have losartan in his medications but they do not think he was taking Continue to monitor blood pressure   Anemia Clinically stable.  Check cbc today.   Will need to decide when to restart anticoagulation.  Will depend on hgb and PET scan results   Groin swelling Consistent with reducible hernia.  Discussed warning signs of pain or vomiting   Renal insufficiency Acute. Likely primarily due to contrast injury.  Check bmet today.    Foot drop, left foot Thought to be secondary to hematoma compression.  Continue Physical Therapy.  Once finishes antibiotics for bacteremia and  if his PET scan shows cancer is stable may investigate further    Orders Placed This Encounter  Procedures   CBC   Renal Function Panel   POCT glycosylated hemoglobin (Hb A1C)   Meds ordered this encounter  Medications   busPIRone (BUSPAR) 5 MG tablet    Sig: Take 1 tablet (5 mg total) by mouth 2 (two) times daily.    Dispense:  60 tablet    Refill:  1   Patient Instructions  Good to see you today - Thank you for coming in  Things we discussed today:  Do not take the Losartan  Take the Ramelteon only if needed for sleep   Stop the Pregabalin   I will call you if your tests are not good.  Otherwise, I will send you a message on MyChart (if it is active) or a letter in the mail..  If you do not hear from me with in 2 weeks please call our office.     Please always bring your medication bottles  Keep your appointment with me in June

## 2021-07-05 ENCOUNTER — Encounter: Payer: Self-pay | Admitting: Internal Medicine

## 2021-07-05 DIAGNOSIS — Z7984 Long term (current) use of oral hypoglycemic drugs: Secondary | ICD-10-CM | POA: Diagnosis not present

## 2021-07-05 DIAGNOSIS — Z7902 Long term (current) use of antithrombotics/antiplatelets: Secondary | ICD-10-CM | POA: Diagnosis not present

## 2021-07-05 DIAGNOSIS — D630 Anemia in neoplastic disease: Secondary | ICD-10-CM | POA: Diagnosis not present

## 2021-07-05 DIAGNOSIS — Z9181 History of falling: Secondary | ICD-10-CM | POA: Diagnosis not present

## 2021-07-05 DIAGNOSIS — M109 Gout, unspecified: Secondary | ICD-10-CM | POA: Diagnosis not present

## 2021-07-05 DIAGNOSIS — I10 Essential (primary) hypertension: Secondary | ICD-10-CM | POA: Diagnosis not present

## 2021-07-05 DIAGNOSIS — I824Z3 Acute embolism and thrombosis of unspecified deep veins of distal lower extremity, bilateral: Secondary | ICD-10-CM | POA: Diagnosis not present

## 2021-07-05 DIAGNOSIS — I2510 Atherosclerotic heart disease of native coronary artery without angina pectoris: Secondary | ICD-10-CM | POA: Diagnosis not present

## 2021-07-05 DIAGNOSIS — M1990 Unspecified osteoarthritis, unspecified site: Secondary | ICD-10-CM | POA: Diagnosis not present

## 2021-07-05 DIAGNOSIS — I252 Old myocardial infarction: Secondary | ICD-10-CM | POA: Diagnosis not present

## 2021-07-05 DIAGNOSIS — I69354 Hemiplegia and hemiparesis following cerebral infarction affecting left non-dominant side: Secondary | ICD-10-CM | POA: Diagnosis not present

## 2021-07-05 DIAGNOSIS — C3491 Malignant neoplasm of unspecified part of right bronchus or lung: Secondary | ICD-10-CM | POA: Diagnosis not present

## 2021-07-05 DIAGNOSIS — E119 Type 2 diabetes mellitus without complications: Secondary | ICD-10-CM | POA: Diagnosis not present

## 2021-07-05 LAB — CBC
Hematocrit: 24.5 % — ABNORMAL LOW (ref 37.5–51.0)
Hemoglobin: 7.9 g/dL — ABNORMAL LOW (ref 13.0–17.7)
MCH: 28.2 pg (ref 26.6–33.0)
MCHC: 32.2 g/dL (ref 31.5–35.7)
MCV: 88 fL (ref 79–97)
Platelets: 503 10*3/uL — ABNORMAL HIGH (ref 150–450)
RBC: 2.8 x10E6/uL — ABNORMAL LOW (ref 4.14–5.80)
RDW: 16.1 % — ABNORMAL HIGH (ref 11.6–15.4)
WBC: 10.3 10*3/uL (ref 3.4–10.8)

## 2021-07-05 LAB — RENAL FUNCTION PANEL
Albumin: 3.1 g/dL — ABNORMAL LOW (ref 3.7–4.7)
BUN/Creatinine Ratio: 17 (ref 10–24)
BUN: 29 mg/dL — ABNORMAL HIGH (ref 8–27)
CO2: 19 mmol/L — ABNORMAL LOW (ref 20–29)
Calcium: 8.6 mg/dL (ref 8.6–10.2)
Chloride: 104 mmol/L (ref 96–106)
Creatinine, Ser: 1.73 mg/dL — ABNORMAL HIGH (ref 0.76–1.27)
Glucose: 119 mg/dL — ABNORMAL HIGH (ref 70–99)
Phosphorus: 3.6 mg/dL (ref 2.8–4.1)
Potassium: 4.9 mmol/L (ref 3.5–5.2)
Sodium: 139 mmol/L (ref 134–144)
eGFR: 39 mL/min/{1.73_m2} — ABNORMAL LOW (ref >59)

## 2021-07-09 ENCOUNTER — Other Ambulatory Visit (HOSPITAL_COMMUNITY): Payer: Self-pay

## 2021-07-09 DIAGNOSIS — Z7984 Long term (current) use of oral hypoglycemic drugs: Secondary | ICD-10-CM | POA: Diagnosis not present

## 2021-07-09 DIAGNOSIS — M109 Gout, unspecified: Secondary | ICD-10-CM | POA: Diagnosis not present

## 2021-07-09 DIAGNOSIS — I69354 Hemiplegia and hemiparesis following cerebral infarction affecting left non-dominant side: Secondary | ICD-10-CM | POA: Diagnosis not present

## 2021-07-09 DIAGNOSIS — D630 Anemia in neoplastic disease: Secondary | ICD-10-CM | POA: Diagnosis not present

## 2021-07-09 DIAGNOSIS — Z9181 History of falling: Secondary | ICD-10-CM | POA: Diagnosis not present

## 2021-07-09 DIAGNOSIS — I824Z3 Acute embolism and thrombosis of unspecified deep veins of distal lower extremity, bilateral: Secondary | ICD-10-CM | POA: Diagnosis not present

## 2021-07-09 DIAGNOSIS — I2510 Atherosclerotic heart disease of native coronary artery without angina pectoris: Secondary | ICD-10-CM | POA: Diagnosis not present

## 2021-07-09 DIAGNOSIS — E119 Type 2 diabetes mellitus without complications: Secondary | ICD-10-CM | POA: Diagnosis not present

## 2021-07-09 DIAGNOSIS — I252 Old myocardial infarction: Secondary | ICD-10-CM | POA: Diagnosis not present

## 2021-07-09 DIAGNOSIS — C3491 Malignant neoplasm of unspecified part of right bronchus or lung: Secondary | ICD-10-CM | POA: Diagnosis not present

## 2021-07-09 DIAGNOSIS — R7881 Bacteremia: Secondary | ICD-10-CM | POA: Diagnosis not present

## 2021-07-09 DIAGNOSIS — Z7902 Long term (current) use of antithrombotics/antiplatelets: Secondary | ICD-10-CM | POA: Diagnosis not present

## 2021-07-09 DIAGNOSIS — M1990 Unspecified osteoarthritis, unspecified site: Secondary | ICD-10-CM | POA: Diagnosis not present

## 2021-07-09 DIAGNOSIS — I10 Essential (primary) hypertension: Secondary | ICD-10-CM | POA: Diagnosis not present

## 2021-07-10 ENCOUNTER — Other Ambulatory Visit: Payer: Self-pay | Admitting: Family Medicine

## 2021-07-10 ENCOUNTER — Ambulatory Visit: Payer: Medicare Other | Admitting: Family Medicine

## 2021-07-10 ENCOUNTER — Other Ambulatory Visit (HOSPITAL_COMMUNITY): Payer: Self-pay

## 2021-07-10 DIAGNOSIS — I69354 Hemiplegia and hemiparesis following cerebral infarction affecting left non-dominant side: Secondary | ICD-10-CM | POA: Diagnosis not present

## 2021-07-10 DIAGNOSIS — Z7984 Long term (current) use of oral hypoglycemic drugs: Secondary | ICD-10-CM | POA: Diagnosis not present

## 2021-07-10 DIAGNOSIS — I252 Old myocardial infarction: Secondary | ICD-10-CM | POA: Diagnosis not present

## 2021-07-10 DIAGNOSIS — M109 Gout, unspecified: Secondary | ICD-10-CM | POA: Diagnosis not present

## 2021-07-10 DIAGNOSIS — E119 Type 2 diabetes mellitus without complications: Secondary | ICD-10-CM | POA: Diagnosis not present

## 2021-07-10 DIAGNOSIS — D630 Anemia in neoplastic disease: Secondary | ICD-10-CM | POA: Diagnosis not present

## 2021-07-10 DIAGNOSIS — I824Z3 Acute embolism and thrombosis of unspecified deep veins of distal lower extremity, bilateral: Secondary | ICD-10-CM | POA: Diagnosis not present

## 2021-07-10 DIAGNOSIS — I2510 Atherosclerotic heart disease of native coronary artery without angina pectoris: Secondary | ICD-10-CM | POA: Diagnosis not present

## 2021-07-10 DIAGNOSIS — Z9181 History of falling: Secondary | ICD-10-CM | POA: Diagnosis not present

## 2021-07-10 DIAGNOSIS — C3491 Malignant neoplasm of unspecified part of right bronchus or lung: Secondary | ICD-10-CM | POA: Diagnosis not present

## 2021-07-10 DIAGNOSIS — M1990 Unspecified osteoarthritis, unspecified site: Secondary | ICD-10-CM | POA: Diagnosis not present

## 2021-07-10 DIAGNOSIS — Z7902 Long term (current) use of antithrombotics/antiplatelets: Secondary | ICD-10-CM | POA: Diagnosis not present

## 2021-07-10 DIAGNOSIS — I10 Essential (primary) hypertension: Secondary | ICD-10-CM | POA: Diagnosis not present

## 2021-07-10 MED ORDER — BUSPIRONE HCL 5 MG PO TABS
5.0000 mg | ORAL_TABLET | Freq: Two times a day (BID) | ORAL | 1 refills | Status: DC
Start: 2021-07-10 — End: 2021-10-10

## 2021-07-11 DIAGNOSIS — M79606 Pain in leg, unspecified: Secondary | ICD-10-CM | POA: Diagnosis not present

## 2021-07-11 DIAGNOSIS — S7010XA Contusion of unspecified thigh, initial encounter: Secondary | ICD-10-CM | POA: Diagnosis not present

## 2021-07-12 ENCOUNTER — Inpatient Hospital Stay: Payer: Medicare Other | Admitting: Internal Medicine

## 2021-07-12 ENCOUNTER — Inpatient Hospital Stay: Payer: Medicare Other

## 2021-07-13 DIAGNOSIS — Z7984 Long term (current) use of oral hypoglycemic drugs: Secondary | ICD-10-CM | POA: Diagnosis not present

## 2021-07-13 DIAGNOSIS — M109 Gout, unspecified: Secondary | ICD-10-CM | POA: Diagnosis not present

## 2021-07-13 DIAGNOSIS — I252 Old myocardial infarction: Secondary | ICD-10-CM | POA: Diagnosis not present

## 2021-07-13 DIAGNOSIS — I824Z3 Acute embolism and thrombosis of unspecified deep veins of distal lower extremity, bilateral: Secondary | ICD-10-CM | POA: Diagnosis not present

## 2021-07-13 DIAGNOSIS — Z9181 History of falling: Secondary | ICD-10-CM | POA: Diagnosis not present

## 2021-07-13 DIAGNOSIS — I10 Essential (primary) hypertension: Secondary | ICD-10-CM | POA: Diagnosis not present

## 2021-07-13 DIAGNOSIS — I2510 Atherosclerotic heart disease of native coronary artery without angina pectoris: Secondary | ICD-10-CM | POA: Diagnosis not present

## 2021-07-13 DIAGNOSIS — E119 Type 2 diabetes mellitus without complications: Secondary | ICD-10-CM | POA: Diagnosis not present

## 2021-07-13 DIAGNOSIS — M1990 Unspecified osteoarthritis, unspecified site: Secondary | ICD-10-CM | POA: Diagnosis not present

## 2021-07-13 DIAGNOSIS — I69354 Hemiplegia and hemiparesis following cerebral infarction affecting left non-dominant side: Secondary | ICD-10-CM | POA: Diagnosis not present

## 2021-07-13 DIAGNOSIS — C3491 Malignant neoplasm of unspecified part of right bronchus or lung: Secondary | ICD-10-CM | POA: Diagnosis not present

## 2021-07-13 DIAGNOSIS — D630 Anemia in neoplastic disease: Secondary | ICD-10-CM | POA: Diagnosis not present

## 2021-07-13 DIAGNOSIS — Z7902 Long term (current) use of antithrombotics/antiplatelets: Secondary | ICD-10-CM | POA: Diagnosis not present

## 2021-07-16 DIAGNOSIS — C3491 Malignant neoplasm of unspecified part of right bronchus or lung: Secondary | ICD-10-CM | POA: Diagnosis not present

## 2021-07-16 DIAGNOSIS — I69354 Hemiplegia and hemiparesis following cerebral infarction affecting left non-dominant side: Secondary | ICD-10-CM | POA: Diagnosis not present

## 2021-07-16 DIAGNOSIS — M1990 Unspecified osteoarthritis, unspecified site: Secondary | ICD-10-CM | POA: Diagnosis not present

## 2021-07-16 DIAGNOSIS — I252 Old myocardial infarction: Secondary | ICD-10-CM | POA: Diagnosis not present

## 2021-07-16 DIAGNOSIS — Z7984 Long term (current) use of oral hypoglycemic drugs: Secondary | ICD-10-CM | POA: Diagnosis not present

## 2021-07-16 DIAGNOSIS — M109 Gout, unspecified: Secondary | ICD-10-CM | POA: Diagnosis not present

## 2021-07-16 DIAGNOSIS — I10 Essential (primary) hypertension: Secondary | ICD-10-CM | POA: Diagnosis not present

## 2021-07-16 DIAGNOSIS — I824Z3 Acute embolism and thrombosis of unspecified deep veins of distal lower extremity, bilateral: Secondary | ICD-10-CM | POA: Diagnosis not present

## 2021-07-16 DIAGNOSIS — E119 Type 2 diabetes mellitus without complications: Secondary | ICD-10-CM | POA: Diagnosis not present

## 2021-07-16 DIAGNOSIS — I2510 Atherosclerotic heart disease of native coronary artery without angina pectoris: Secondary | ICD-10-CM | POA: Diagnosis not present

## 2021-07-16 DIAGNOSIS — Z9181 History of falling: Secondary | ICD-10-CM | POA: Diagnosis not present

## 2021-07-16 DIAGNOSIS — Z7902 Long term (current) use of antithrombotics/antiplatelets: Secondary | ICD-10-CM | POA: Diagnosis not present

## 2021-07-16 DIAGNOSIS — D630 Anemia in neoplastic disease: Secondary | ICD-10-CM | POA: Diagnosis not present

## 2021-07-16 NOTE — Progress Notes (Unsigned)
    SUBJECTIVE:   CHIEF COMPLAINT / HPI:   Essential hypertension, benign Off all BP medications now.  Did have losartan in his medications but they do not think he was taking Continue to monitor blood pressure    Anemia Clinically stable.  Check cbc today.   Will need to decide when to restart anticoagulation.  Will depend on hgb and PET scan results    Groin swelling Consistent with reducible hernia.  Discussed warning signs of pain or vomiting    Renal insufficiency Acute. Likely primarily due to contrast injury.  Check bmet today.     Foot drop, left foot Thought to be secondary to hematoma compression.  Continue Physical Therapy.  Once finishes antibiotics for bacteremia and if his PET scan shows cancer is stable may investigate further   PERTINENT  PMH / PSH: ***  OBJECTIVE:   There were no vitals taken for this visit.  ***  ASSESSMENT/PLAN:   No problem-specific Assessment & Plan notes found for this encounter.     Lind Covert, MD [place]

## 2021-07-17 ENCOUNTER — Other Ambulatory Visit: Payer: Self-pay

## 2021-07-17 ENCOUNTER — Encounter: Payer: Self-pay | Admitting: Family Medicine

## 2021-07-17 ENCOUNTER — Ambulatory Visit (INDEPENDENT_AMBULATORY_CARE_PROVIDER_SITE_OTHER): Payer: Medicare Other | Admitting: Family Medicine

## 2021-07-17 DIAGNOSIS — M21372 Foot drop, left foot: Secondary | ICD-10-CM

## 2021-07-17 DIAGNOSIS — N289 Disorder of kidney and ureter, unspecified: Secondary | ICD-10-CM

## 2021-07-17 DIAGNOSIS — I10 Essential (primary) hypertension: Secondary | ICD-10-CM

## 2021-07-17 DIAGNOSIS — D649 Anemia, unspecified: Secondary | ICD-10-CM

## 2021-07-17 NOTE — Assessment & Plan Note (Signed)
Unchanged

## 2021-07-17 NOTE — Assessment & Plan Note (Signed)
Clinically stable.  Check labs today

## 2021-07-17 NOTE — Patient Instructions (Signed)
Good to see you today - Thank you for coming in  Things we discussed today:  I will send in refill for buspar  Stop the Stool softener - Use the Miralax as needed   Go to bed at same time and get up same time - stop naps to improve your sleep  I will call you if your tests are not good.  Otherwise, I will send you a message on MyChart (if it is active) or a letter in the mail..  If you do not hear from me with in 2 weeks please call our office.     Please always bring your medication bottles  Come back to see me in 3 months

## 2021-07-17 NOTE — Assessment & Plan Note (Signed)
Clinically stable taking iron daily.  Will check cbc.  Hopefully can restart DOAC soon

## 2021-07-17 NOTE — Assessment & Plan Note (Signed)
Blood pressure slowly rising.  Will monitor at home and restart medications as needed.

## 2021-07-18 ENCOUNTER — Inpatient Hospital Stay: Payer: Medicare Other

## 2021-07-18 ENCOUNTER — Encounter: Payer: Self-pay | Admitting: Internal Medicine

## 2021-07-18 ENCOUNTER — Inpatient Hospital Stay: Payer: Medicare Other | Attending: Physician Assistant | Admitting: Internal Medicine

## 2021-07-18 VITALS — BP 139/62 | HR 73 | Temp 97.9°F | Resp 18

## 2021-07-18 DIAGNOSIS — Z79899 Other long term (current) drug therapy: Secondary | ICD-10-CM | POA: Insufficient documentation

## 2021-07-18 DIAGNOSIS — C3490 Malignant neoplasm of unspecified part of unspecified bronchus or lung: Secondary | ICD-10-CM | POA: Insufficient documentation

## 2021-07-18 DIAGNOSIS — D649 Anemia, unspecified: Secondary | ICD-10-CM | POA: Insufficient documentation

## 2021-07-18 DIAGNOSIS — R531 Weakness: Secondary | ICD-10-CM | POA: Insufficient documentation

## 2021-07-18 DIAGNOSIS — C3491 Malignant neoplasm of unspecified part of right bronchus or lung: Secondary | ICD-10-CM | POA: Diagnosis not present

## 2021-07-18 DIAGNOSIS — Z95828 Presence of other vascular implants and grafts: Secondary | ICD-10-CM | POA: Diagnosis not present

## 2021-07-18 DIAGNOSIS — Z5111 Encounter for antineoplastic chemotherapy: Secondary | ICD-10-CM

## 2021-07-18 DIAGNOSIS — R630 Anorexia: Secondary | ICD-10-CM | POA: Diagnosis not present

## 2021-07-18 DIAGNOSIS — J189 Pneumonia, unspecified organism: Secondary | ICD-10-CM | POA: Insufficient documentation

## 2021-07-18 DIAGNOSIS — I82402 Acute embolism and thrombosis of unspecified deep veins of left lower extremity: Secondary | ICD-10-CM | POA: Diagnosis not present

## 2021-07-18 DIAGNOSIS — R7881 Bacteremia: Secondary | ICD-10-CM | POA: Insufficient documentation

## 2021-07-18 LAB — CMP14+EGFR
ALT: <5 IU/L (ref 0–44)
AST: 14 IU/L (ref 0–40)
Albumin/Globulin Ratio: 1 — ABNORMAL LOW (ref 1.2–2.2)
Albumin: 3.5 g/dL — ABNORMAL LOW (ref 3.7–4.7)
Alkaline Phosphatase: 131 IU/L — ABNORMAL HIGH (ref 44–121)
BUN/Creatinine Ratio: 11 (ref 10–24)
BUN: 14 mg/dL (ref 8–27)
Bilirubin Total: 1 mg/dL (ref 0.0–1.2)
CO2: 21 mmol/L (ref 20–29)
Calcium: 8.9 mg/dL (ref 8.6–10.2)
Chloride: 102 mmol/L (ref 96–106)
Creatinine, Ser: 1.25 mg/dL (ref 0.76–1.27)
Globulin, Total: 3.4 g/dL (ref 1.5–4.5)
Glucose: 116 mg/dL — ABNORMAL HIGH (ref 70–99)
Potassium: 3.9 mmol/L (ref 3.5–5.2)
Sodium: 140 mmol/L (ref 134–144)
Total Protein: 6.9 g/dL (ref 6.0–8.5)
eGFR: 58 mL/min/{1.73_m2} — ABNORMAL LOW (ref >59)

## 2021-07-18 LAB — CBC
Hematocrit: 22.8 % — ABNORMAL LOW (ref 37.5–51.0)
Hemoglobin: 8 g/dL — ABNORMAL LOW (ref 13.0–17.7)
MCH: 30.4 pg (ref 26.6–33.0)
MCHC: 35.1 g/dL (ref 31.5–35.7)
MCV: 87 fL (ref 79–97)
Platelets: 496 10*3/uL — ABNORMAL HIGH (ref 150–450)
RBC: 2.63 x10E6/uL — CL (ref 4.14–5.80)
RDW: 15.9 % — ABNORMAL HIGH (ref 11.6–15.4)
WBC: 7.8 10*3/uL (ref 3.4–10.8)

## 2021-07-18 NOTE — Progress Notes (Signed)
[place] [phone]   [phone]  OFFICE PROGRESS NOTE  Lind Covert, MD [place] [address]  DIAGNOSIS: Stage IV (T1b, N3, M1 C) non-small cell lung cancer, adenocarcinoma with positive ALK gene translocation diagnosed in February 2023.  PRIOR THERAPY: None  CURRENT THERAPY: Alecensa (Alectinib) 600 mg p.o. twice daily.  First dose April 11, 2021.  Status post 2 months of treatment.  INTERVAL HISTORY: Arthur [name] 81 y.o. male returns to the clinic today accompanied by his son.  The patient is feeling fine today with no concerning complaints except for the weakness in the lower extremity.  He has less swelling in the left lower extremity.  He is still on Plavix after having the IVC filter placed.  He denied having any current chest pain, shortness of breath except with exertion with no cough or hemoptysis.  He has no nausea, vomiting, diarrhea or constipation.  He has no headache or visual changes.  He lost few pounds since his last time.  He continues to have lack of appetite and change in the taste.  The patient is here today for evaluation and recommendation regarding his condition after he left the hospital.  He is currently on IV antibiotics with Ancef for bacteremia and pneumonia that was diagnosed during his hospitalization.    MEDICAL HISTORY: Past Medical History:  Diagnosis Date   Diabetes mellitus without complication ([facility])    High cholesterol    Hypertension    lung ca    Normal nuclear stress test 01/29/2008   stress perfusion study apparently in 2010 in [facility] which he said was negative.    ALLERGIES:  is allergic to ace inhibitors and iohexol.  MEDICATIONS:  Current Outpatient Medications  Medication Sig Dispense Refill   acetaminophen (TYLENOL) 650 MG CR tablet Take 650 mg by mouth every 8 (eight) hours as needed for pain.     alectinib (ALECENSA) 150 MG capsule Take 4  capsules (600 mg total) by mouth 2 (two) times daily with a meal. 240 capsule 3   busPIRone (BUSPAR) 5 MG tablet Take 1 tablet (5 mg total) by mouth 2 (two) times daily. 60 tablet 1   ceFAZolin (ANCEF) IVPB Inject 1 g into the vein daily for 27 days. Indication:  bacteremia/cellulits First Dose: Yes Last Day of Therapy:  07/19/2021 Labs - Once weekly:  CBC/D and BMP, Labs - Every other week:  ESR and CRP Method of administration: IV Push Method of administration may be changed at the discretion of home infusion pharmacist based upon assessment of the patient and/or caregiver's ability to self-administer the medication ordered. 27 Units 0   clopidogrel (PLAVIX) 75 MG tablet Take 1 tablet (75 mg total) by mouth daily. 90 tablet 1   ferrous sulfate 324 (65 Fe) MG TBEC Take 324 mg by mouth daily. 30 tablet    polyethylene glycol powder (GLYCOLAX/MIRALAX) 17 GM/SCOOP powder Take 17 g by mouth daily. 3350 g 1   ramelteon (ROZEREM) 8 MG tablet TAKE 1 TABLET BY MOUTH AT BEDTIME 30 tablet 0   rosuvastatin (CRESTOR) 5 MG tablet Take 1 tablet (5 mg total) by mouth daily. 90 tablet 0   trolamine salicylate (ASPER-FLEX) 10 % cream Apply 1 application topically as needed for muscle pain. 85 g 0   No current facility-administered medications for this visit.    SURGICAL HISTORY:  Past Surgical History:  Procedure Laterality Date   CATARACT EXTRACTION, BILATERAL  2006   CORONARY STENT INTERVENTION N/A 02/06/2021   Procedure: CORONARY STENT INTERVENTION;  Surgeon: Jettie Booze, MD;  Location: [place] [facility];  Service: Cardiovascular;  Laterality: N/A;   FINE NEEDLE ASPIRATION  03/21/2021   Procedure: FINE NEEDLE ASPIRATION;  Surgeon: Garner Nash, DO;  Location: [place];  Service: Pulmonary;;   INTRAVASCULAR ULTRASOUND/IVUS N/A 02/06/2021   Procedure: Intravascular Ultrasound/IVUS;  Surgeon: Jettie Booze, MD;  Location: [place] [facility];  Service: Cardiovascular;  Laterality:  N/A;   KNEE SURGERY  2005   LEFT HEART CATH AND CORONARY ANGIOGRAPHY N/A 02/06/2021   Procedure: LEFT HEART CATH AND CORONARY ANGIOGRAPHY;  Surgeon: Jettie Booze, MD;  Location: [place] [facility];  Service: Cardiovascular;  Laterality: N/A;   VIDEO BRONCHOSCOPY WITH ENDOBRONCHIAL ULTRASOUND Bilateral 03/21/2021   Procedure: VIDEO BRONCHOSCOPY WITH ENDOBRONCHIAL ULTRASOUND;  Surgeon: Garner Nash, DO;  Location: [place];  Service: Pulmonary;  Laterality: Bilateral;    REVIEW OF SYSTEMS:  Constitutional: positive for anorexia and fatigue Eyes: negative Ears, nose, mouth, throat, and face: negative Respiratory: positive for dyspnea on exertion Cardiovascular: negative Gastrointestinal: negative Genitourinary:negative Integument/breast: negative Hematologic/lymphatic: negative Musculoskeletal:positive for muscle weakness Neurological: negative Behavioral/Psych: negative Endocrine: negative Allergic/Immunologic: negative   PHYSICAL EXAMINATION: General appearance: alert, cooperative, fatigued, and no distress Head: Normocephalic, without obvious abnormality, atraumatic Neck: no adenopathy, no JVD, supple, symmetrical, trachea midline, and thyroid not enlarged, symmetric, no tenderness/mass/nodules Lymph nodes: Cervical, supraclavicular, and axillary nodes normal. Resp: clear to auscultation bilaterally Back: symmetric, no curvature. ROM normal. No CVA tenderness. Cardio: regular rate and rhythm, S1, S2 normal, no murmur, click, rub or gallop GI: soft, non-tender; bowel sounds normal; no masses,  no organomegaly Extremities: edema 1+ edema in the left lower extremity Neurologic: Alert and oriented X 3, normal strength and tone. Normal symmetric reflexes. Normal coordination and gait  ECOG PERFORMANCE STATUS: 1 - Symptomatic but completely ambulatory  Blood pressure 139/62, pulse 73, temperature 97.9 F (36.6 C), temperature source Tympanic, resp. rate 18, SpO2 100  %.  LABORATORY DATA: Lab Results  Component Value Date   WBC 7.8 07/17/2021   HGB 8.0 (L) 07/17/2021   HCT 22.8 (L) 07/17/2021   MCV 87 07/17/2021   PLT 496 (H) 07/17/2021      Chemistry      Component Value Date/Time   NA 140 07/17/2021 1103   K 3.9 07/17/2021 1103   CL 102 07/17/2021 1103   CO2 21 07/17/2021 1103   BUN 14 07/17/2021 1103   CREATININE 1.25 07/17/2021 1103   CREATININE 1.83 (H) 06/11/2021 1350   CREATININE 1.18 11/15/2015 1114      Component Value Date/Time   CALCIUM 8.9 07/17/2021 1103   ALKPHOS 131 (H) 07/17/2021 1103   AST 14 07/17/2021 1103   AST 21 06/11/2021 1350   ALT <5 07/17/2021 1103   ALT 11 06/11/2021 1350   BILITOT 1.0 07/17/2021 1103   BILITOT 1.4 (H) 06/11/2021 1350       RADIOGRAPHIC STUDIES: [place] EKG SITE RITE  Result Date: 06/23/2021 If Site Rite image not attached, placement could not be confirmed due to current cardiac rhythm.  ECHOCARDIOGRAM COMPLETE  Result Date: 06/21/2021    ECHOCARDIOGRAM REPORT   Patient Name:   LAVONTA TILLIS [facility] Date of Exam: 06/21/2021 Medical Rec #:  [id number]         Height:       71.0 in Accession #:    [id number]  Weight:       164.5 lb Date of Birth:  August 11, 1940          BSA:          1.940 m Patient Age:    62 years          BP:           114/44 mmHg Patient Gender: M                 HR:           73 bpm. Exam Location:  Inpatient Procedure: 2D Echo, 3D Echo, Cardiac Doppler and Color Doppler Indications:    R01.1 Murmur. Bacteremia.  History:        Patient has prior history of Echocardiogram examinations, most                 recent 02/04/2021. CAD, Signs/Symptoms:Bacteremia and Murmur; Risk                 Factors:Dyslipidemia, Hypertension and Diabetes. Pulmonary                 embolus. Metastatic lung cancer.  Sonographer:    Roseanna Rainbow RDCS Referring Phys: [id number] CARINA M BROWN  Sonographer Comments: Technically difficult study due to poor echo windows and suboptimal parasternal window. IMPRESSIONS   1. Left ventricular ejection fraction, by estimation, is 65 to 70%. Left ventricular ejection fraction by 2D MOD biplane is 70.2 %. The left ventricle has normal function. The left ventricle has no regional wall motion abnormalities. There is moderate asymmetric left ventricular hypertrophy of the basal-septal segment. Left ventricular diastolic function could not be evaluated.  2. Right ventricular systolic function is normal. The right ventricular size is normal. There is moderately elevated pulmonary artery systolic pressure. The estimated right ventricular systolic pressure is 02.7 mmHg.  3. Right atrial size was mildly dilated.  4. The pericardial effusion is anterior to the right ventricle.  5. The mitral valve is grossly normal. No evidence of mitral valve regurgitation.  6. Heavy calcification on the tip of the non-coronary cusp of the aortic valve. The aortic valve is calcified. Aortic valve regurgitation is trivial. Aortic valve sclerosis/calcification is present, without any evidence of aortic stenosis. Aortic valve mean gradient measures 8.8 mmHg.  7. The inferior vena cava is dilated in size with <50% respiratory variability, suggesting right atrial pressure of 15 mmHg.  8. Rhythm strip during this exam demostrated atrial fibrillation. Comparison(s): Changes from prior study are noted. 03/18/2021: LVEF 55-60%, severe ASH, small RV anterior pericardial effusion. Conclusion(s)/Recommendation(s): No evidence of valvular vegetations on this transthoracic echocardiogram. Consider a transesophageal echocardiogram to exclude infective endocarditis if clinically indicated. FINDINGS  Left Ventricle: Left ventricular ejection fraction, by estimation, is 65 to 70%. Left ventricular ejection fraction by 2D MOD biplane is 70.2 %. The left ventricle has normal function. The left ventricle has no regional wall motion abnormalities. 3D left ventricular ejection fraction analysis performed but not reported based on  interpreter judgement due to suboptimal tracking. The left ventricular internal cavity size was normal in size. There is moderate asymmetric left ventricular hypertrophy of the basal-septal segment. Left ventricular diastolic function could not be evaluated due to atrial fibrillation. Left ventricular diastolic function could not be evaluated. Right Ventricle: The right ventricular size is normal. No increase in right ventricular wall thickness. Right ventricular systolic function is normal. There is moderately elevated pulmonary artery systolic pressure. The tricuspid regurgitant velocity is 2.99 m/s, and with  an assumed right atrial pressure of 15 mmHg, the estimated right ventricular systolic pressure is 71.0 mmHg. Left Atrium: Left atrial size was normal in size. Right Atrium: Right atrial size was mildly dilated. Pericardium: Trivial pericardial effusion is present. The pericardial effusion is anterior to the right ventricle. Mitral Valve: The mitral valve is grossly normal. No evidence of mitral valve regurgitation. Tricuspid Valve: The tricuspid valve is grossly normal. Tricuspid valve regurgitation is trivial. Aortic Valve: Heavy calcification on the tip of the non-coronary cusp of the aortic valve. The aortic valve is calcified. Aortic valve regurgitation is trivial. Aortic valve sclerosis/calcification is present, without any evidence of aortic stenosis. Aortic valve mean gradient measures 8.8 mmHg. Aortic valve peak gradient measures 16.2 mmHg. Aortic valve area, by VTI measures 2.46 cm. Pulmonic Valve: The pulmonic valve was normal in structure. Pulmonic valve regurgitation is not visualized. Aorta: The aortic root and ascending aorta are structurally normal, with no evidence of dilitation. Venous: The inferior vena cava is dilated in size with less than 50% respiratory variability, suggesting right atrial pressure of 15 mmHg. IAS/Shunts: No atrial level shunt detected by color flow Doppler. EKG: Rhythm  strip during this exam demostrated atrial fibrillation.  LEFT VENTRICLE PLAX 2D                        Biplane EF (MOD) LVIDd:         4.70 cm         LV Biplane EF:   Left LVIDs:         3.10 cm                          ventricular LV PW:         0.90 cm                          ejection LV IVS:        0.80 cm                          fraction by LVOT diam:     2.10 cm                          2D MOD LV SV:         89                               biplane is LV SV Index:   46                               70.2 %. LVOT Area:     3.46 cm                                Diastology                                LV e' medial:    9.57 cm/s LV Volumes (MOD)               LV E/e' medial:  12.9 LV vol d, MOD    72.1  ml       LV e' lateral:   9.36 cm/s A2C:                           LV E/e' lateral: 13.1 LV vol d, MOD    53.9 ml A4C: LV vol s, MOD    16.5 ml A2C: LV vol s, MOD    19.9 ml       3D Volume EF: A4C:                           3D EF:        59 % LV SV MOD A2C:   55.6 ml       LV EDV:       102 ml LV SV MOD A4C:   53.9 ml       LV ESV:       42 ml LV SV MOD BP:    44.4 ml       LV SV:        60 ml RIGHT VENTRICLE             IVC RV S prime:     12.65 cm/s  IVC diam: 2.20 cm TAPSE (M-mode): 1.7 cm LEFT ATRIUM             Index        RIGHT ATRIUM           Index LA diam:        3.40 cm 1.75 cm/m   RA Area:     20.60 cm LA Vol (A2C):   46.2 ml 23.81 ml/m  RA Volume:   62.60 ml  32.26 ml/m LA Vol (A4C):   45.6 ml 23.50 ml/m LA Biplane Vol: 46.6 ml 24.02 ml/m  AORTIC VALVE AV Area (Vmax):    2.25 cm AV Area (Vmean):   2.35 cm AV Area (VTI):     2.46 cm AV Vmax:           201.40 cm/s AV Vmean:          137.800 cm/s AV VTI:            0.361 m AV Peak Grad:      16.2 mmHg AV Mean Grad:      8.8 mmHg LVOT Vmax:         131.00 cm/s LVOT Vmean:        93.600 cm/s LVOT VTI:          0.256 m LVOT/AV VTI ratio: 0.71  AORTA Ao Root diam: 3.50 cm Ao Asc diam:  2.80 cm MITRAL VALVE                TRICUSPID VALVE MV Area  (PHT): 3.48 cm     TR Peak grad:   35.8 mmHg MV Decel Time: 218 msec     TR Vmax:        299.00 cm/s MV E velocity: 123.00 cm/s MV A velocity: 91.20 cm/s   SHUNTS MV E/A ratio:  1.35         Systemic VTI:  0.26 m                             Systemic Diam: 2.10 cm Zoila Shutter MD Electronically signed by Zoila Shutter MD Signature Date/Time: 06/21/2021/4:05:10 PM    Final  VAS [place] UPPER EXTREMITY VENOUS DUPLEX  Result Date: 06/20/2021 UPPER VENOUS STUDY  Patient Name:  MALAHKI GASAWAY [facility]  Date of Exam:   06/20/2021 Medical Rec #: [id number]          Accession #:    [id number] Date of Birth: 09/21/1940           Patient Gender: M Patient Age:   80 years Exam Location:  [facility] Procedure:      VAS [place] UPPER EXTREMITY VENOUS DUPLEX Referring Phys: Burna Cash [facility] --------------------------------------------------------------------------------  Indications: Pain, Swelling, and Erythema Other Indications: Recent IV placement to LUE. Risk Factors: Cancer patient (stage 4 NSCLC) PE (01/2021) DVT BLE (02/2021) S/P IVC filter. Comparison Study: No previous exams Performing Technologist: Jody Hill RVT, RDMS  Examination Guidelines: A complete evaluation includes B-mode imaging, spectral Doppler, color Doppler, and power Doppler as needed of all accessible portions of each vessel. Bilateral testing is considered an integral part of a complete examination. Limited examinations for reoccurring indications may be performed as noted.  Right Findings: +----------+------------+---------+-----------+----------+--------------------+ RIGHT     CompressiblePhasicitySpontaneousProperties      Summary        +----------+------------+---------+-----------+----------+--------------------+ Subclavian               Yes       Yes                   patent by                                                              color/doppler     +----------+------------+---------+-----------+----------+--------------------+   Left Findings: +----------+------------+---------+-----------+----------+-------+ LEFT      CompressiblePhasicitySpontaneousPropertiesSummary +----------+------------+---------+-----------+----------+-------+ IJV           Full       Yes       Yes                      +----------+------------+---------+-----------+----------+-------+ Subclavian    Full       Yes       Yes                      +----------+------------+---------+-----------+----------+-------+ Axillary      Full       Yes       Yes                      +----------+------------+---------+-----------+----------+-------+ Brachial      Full       Yes       Yes                      +----------+------------+---------+-----------+----------+-------+ Radial        Full                                          +----------+------------+---------+-----------+----------+-------+ Ulnar         Full                                          +----------+------------+---------+-----------+----------+-------+  Cephalic      None       No        No                Acute  +----------+------------+---------+-----------+----------+-------+ Basilic       Full       Yes       Yes                      +----------+------------+---------+-----------+----------+-------+  Summary:  Right: No evidence of superficial vein thrombosis in the upper extremity.  Left: No evidence of deep vein thrombosis in the upper extremity. Findings consistent with acute superficial vein thrombosis involving the left cephalic vein.  *See table(s) above for measurements and observations.  Diagnosing physician: Harold Barban MD Electronically signed by Harold Barban MD on 06/20/2021 at 8:54:45 PM.    Final    CT EXTREMITY LOWER LEFT WO CONTRAST  Result Date: 06/20/2021 CLINICAL DATA:  Hematoma. Known left lower extremity deep venous thrombosis seen on ultrasound. History of lung cancer. EXAM: CT OF THE LOWER LEFT EXTREMITY WITHOUT CONTRAST  TECHNIQUE: Multidetector CT imaging of the lower left extremity was performed according to the standard protocol. RADIATION DOSE REDUCTION: This exam was performed according to the departmental dose-optimization program which includes automated exposure control, adjustment of the mA and/or kV according to patient size and/or use of iterative reconstruction technique. COMPARISON:  MRI left thigh 06/15/2021 FINDINGS: Bones/Joint/Cartilage Old healed proximal fibular diaphyseal fracture. Severe medial and patellofemoral compartment joint space narrowing and peripheral osteophytosis of the left knee. Chronic well corticated ossicle distal to the medial malleolus likely the sequela of remote trauma. Moderate calcaneocuboid osteoarthritis. Moderate plantar calcaneal heel spur. No acute fracture or dislocation. No cortical erosion is seen. Ligaments Suboptimally assessed by CT. Muscles and Tendons There is a dense fluid collection within the posterior compartment of the mid left thigh measuring up to approximately 5.5 x 6.7 x 19 cm, similar to 4.9 x 6.5 x 23 cm on 06/15/2021. There is again a small fat intensity lipoma superficial to the left proximal rectus femoris muscle (axial series 4, image 31). There is again moderate edema within the subcutaneous fat of the left thigh and proximal left calf. Soft tissues There are dense atherosclerotic calcifications within the visualized left lower extremity arteries. Small Baker's cyst. Partial visualization of the prior previously seen large right inguinal hernia containing mesenteric fat and nondistended loops of small bowel. IMPRESSION: 1. Stable to slightly decreased size of the previously seen left thigh posterior compartment complex fluid collection, likely an evolving hematoma. 2. No acute fracture. Old healed proximal left fibular diaphyseal fracture. 3. Small Baker's cyst. Electronically Signed   By: Yvonne Kendall M.D.   On: 06/20/2021 17:07   DG CHEST PORT 1  VIEW  Result Date: 06/19/2021 CLINICAL DATA:  Shortness of breath and fever. EXAM: PORTABLE CHEST 1 VIEW COMPARISON:  Jun 17, 2021 FINDINGS: The cardiac silhouette is mildly enlarged and unchanged in size. Mild, stable diffusely increased interstitial lung markings are seen. Mild linear scarring and/or atelectasis is seen within the left lung base. There is no evidence of a pleural effusion or pneumothorax. Multiple chronic left-sided rib fractures are seen. IMPRESSION: 1. Stable exam without acute or active cardiopulmonary disease. Electronically Signed   By: Virgina Norfolk M.D.   On: 06/19/2021 20:38    ASSESSMENT AND PLAN: This is a very pleasant 81 years old [place] male diagnosed with Stage IV (T1b, N3, M1 C)  non-small cell lung cancer, adenocarcinoma with positive ALK gene translocation in February 2023. The patient is currently undergoing treatment with Alecensa (Alectinib) 600 mg p.o. twice daily.  He has been tolerating the treatment well with no concerning complaints.  He is status post 3 months of treatment.  This treatment was interrupted for 2 weeks secondary to the venous thrombosis and hospitalization. The patient has been tolerating this treatment well with no concerning adverse effects. He was admitted to the hospital recently with pneumonia and bacteremia and currently undergoing IV antibiotics with Ancef.  He has a PICC line for this.  He is expected to complete this course of treatment in few days. For the history of deep venous thrombosis, he has IVC filter placed.  He is currently on Plavix but it may be a good idea to start him on a reduced dose Eliquis may be 2.5 mg p.o. twice daily for anticoagulation once his hematoma is better. I will see the patient back for follow-up visit in 2-3 weeks after repeating CT scan of the chest, abdomen and pelvis for restaging of his disease and close monitoring of the suspicious pleural-based lesion that was seen on recent CT scan of the chest  during his hospitalization. For the anemia, he will continue on the oral iron tablet with vitamin C or orange juice. The patient was advised to call immediately if he has any other concerning symptoms in the interval.  The patient voices understanding of current disease status and treatment options and is in agreement with the current care plan.  All questions were answered. The patient knows to call the clinic with any problems, questions or concerns. We can certainly see the patient much sooner if necessary.  The total time spent in the appointment was 30 minutes.  Disclaimer: This note was dictated with voice recognition software. Similar sounding words can inadvertently be transcribed and may not be corrected upon review.

## 2021-07-19 DIAGNOSIS — I2510 Atherosclerotic heart disease of native coronary artery without angina pectoris: Secondary | ICD-10-CM | POA: Diagnosis not present

## 2021-07-19 DIAGNOSIS — M109 Gout, unspecified: Secondary | ICD-10-CM | POA: Diagnosis not present

## 2021-07-19 DIAGNOSIS — D630 Anemia in neoplastic disease: Secondary | ICD-10-CM | POA: Diagnosis not present

## 2021-07-19 DIAGNOSIS — Z7984 Long term (current) use of oral hypoglycemic drugs: Secondary | ICD-10-CM | POA: Diagnosis not present

## 2021-07-19 DIAGNOSIS — E119 Type 2 diabetes mellitus without complications: Secondary | ICD-10-CM | POA: Diagnosis not present

## 2021-07-19 DIAGNOSIS — I10 Essential (primary) hypertension: Secondary | ICD-10-CM | POA: Diagnosis not present

## 2021-07-19 DIAGNOSIS — I824Z3 Acute embolism and thrombosis of unspecified deep veins of distal lower extremity, bilateral: Secondary | ICD-10-CM | POA: Diagnosis not present

## 2021-07-19 DIAGNOSIS — I252 Old myocardial infarction: Secondary | ICD-10-CM | POA: Diagnosis not present

## 2021-07-19 DIAGNOSIS — M1990 Unspecified osteoarthritis, unspecified site: Secondary | ICD-10-CM | POA: Diagnosis not present

## 2021-07-19 DIAGNOSIS — Z9181 History of falling: Secondary | ICD-10-CM | POA: Diagnosis not present

## 2021-07-19 DIAGNOSIS — Z7902 Long term (current) use of antithrombotics/antiplatelets: Secondary | ICD-10-CM | POA: Diagnosis not present

## 2021-07-19 DIAGNOSIS — I69354 Hemiplegia and hemiparesis following cerebral infarction affecting left non-dominant side: Secondary | ICD-10-CM | POA: Diagnosis not present

## 2021-07-19 DIAGNOSIS — C3491 Malignant neoplasm of unspecified part of right bronchus or lung: Secondary | ICD-10-CM | POA: Diagnosis not present

## 2021-07-20 ENCOUNTER — Encounter: Payer: Self-pay | Admitting: *Deleted

## 2021-07-20 ENCOUNTER — Telehealth: Payer: Self-pay

## 2021-07-20 DIAGNOSIS — I824Z3 Acute embolism and thrombosis of unspecified deep veins of distal lower extremity, bilateral: Secondary | ICD-10-CM | POA: Diagnosis not present

## 2021-07-20 DIAGNOSIS — Z7984 Long term (current) use of oral hypoglycemic drugs: Secondary | ICD-10-CM | POA: Diagnosis not present

## 2021-07-20 DIAGNOSIS — Z7902 Long term (current) use of antithrombotics/antiplatelets: Secondary | ICD-10-CM | POA: Diagnosis not present

## 2021-07-20 DIAGNOSIS — E119 Type 2 diabetes mellitus without complications: Secondary | ICD-10-CM | POA: Diagnosis not present

## 2021-07-20 DIAGNOSIS — Z9181 History of falling: Secondary | ICD-10-CM | POA: Diagnosis not present

## 2021-07-20 DIAGNOSIS — M1990 Unspecified osteoarthritis, unspecified site: Secondary | ICD-10-CM | POA: Diagnosis not present

## 2021-07-20 DIAGNOSIS — I10 Essential (primary) hypertension: Secondary | ICD-10-CM | POA: Diagnosis not present

## 2021-07-20 DIAGNOSIS — D630 Anemia in neoplastic disease: Secondary | ICD-10-CM | POA: Diagnosis not present

## 2021-07-20 DIAGNOSIS — I252 Old myocardial infarction: Secondary | ICD-10-CM | POA: Diagnosis not present

## 2021-07-20 DIAGNOSIS — M109 Gout, unspecified: Secondary | ICD-10-CM | POA: Diagnosis not present

## 2021-07-20 DIAGNOSIS — C3491 Malignant neoplasm of unspecified part of right bronchus or lung: Secondary | ICD-10-CM | POA: Diagnosis not present

## 2021-07-20 DIAGNOSIS — I2510 Atherosclerotic heart disease of native coronary artery without angina pectoris: Secondary | ICD-10-CM | POA: Diagnosis not present

## 2021-07-20 DIAGNOSIS — I69354 Hemiplegia and hemiparesis following cerebral infarction affecting left non-dominant side: Secondary | ICD-10-CM | POA: Diagnosis not present

## 2021-07-20 NOTE — Telephone Encounter (Signed)
I spoke with patient's daughter in law [name] and advised her per Tammy Sours it ok to have picc removed. Also HH should also have orders to remove picc line. She was also advised of patient's appointment with [facility].  Janalyn Higby T Pricilla Loveless

## 2021-07-20 NOTE — Telephone Encounter (Signed)
[facility] notified to Pull PICC line today per Marcos Eke, NP.  Mckenlee Mangham Lesli Albee, CMA

## 2021-07-23 ENCOUNTER — Telehealth: Payer: Self-pay | Admitting: Internal Medicine

## 2021-07-23 DIAGNOSIS — Z9181 History of falling: Secondary | ICD-10-CM | POA: Diagnosis not present

## 2021-07-23 DIAGNOSIS — N1831 Chronic kidney disease, stage 3a: Secondary | ICD-10-CM | POA: Diagnosis not present

## 2021-07-23 DIAGNOSIS — Z7902 Long term (current) use of antithrombotics/antiplatelets: Secondary | ICD-10-CM | POA: Diagnosis not present

## 2021-07-23 DIAGNOSIS — I82403 Acute embolism and thrombosis of unspecified deep veins of lower extremity, bilateral: Secondary | ICD-10-CM | POA: Diagnosis not present

## 2021-07-23 DIAGNOSIS — D630 Anemia in neoplastic disease: Secondary | ICD-10-CM | POA: Diagnosis not present

## 2021-07-23 DIAGNOSIS — Z7984 Long term (current) use of oral hypoglycemic drugs: Secondary | ICD-10-CM | POA: Diagnosis not present

## 2021-07-23 DIAGNOSIS — C3491 Malignant neoplasm of unspecified part of right bronchus or lung: Secondary | ICD-10-CM | POA: Diagnosis not present

## 2021-07-23 DIAGNOSIS — I824Z3 Acute embolism and thrombosis of unspecified deep veins of distal lower extremity, bilateral: Secondary | ICD-10-CM | POA: Diagnosis not present

## 2021-07-23 DIAGNOSIS — I2510 Atherosclerotic heart disease of native coronary artery without angina pectoris: Secondary | ICD-10-CM | POA: Diagnosis not present

## 2021-07-23 DIAGNOSIS — M1990 Unspecified osteoarthritis, unspecified site: Secondary | ICD-10-CM | POA: Diagnosis not present

## 2021-07-23 DIAGNOSIS — I10 Essential (primary) hypertension: Secondary | ICD-10-CM | POA: Diagnosis not present

## 2021-07-23 DIAGNOSIS — I252 Old myocardial infarction: Secondary | ICD-10-CM | POA: Diagnosis not present

## 2021-07-23 DIAGNOSIS — M109 Gout, unspecified: Secondary | ICD-10-CM | POA: Diagnosis not present

## 2021-07-23 DIAGNOSIS — I69354 Hemiplegia and hemiparesis following cerebral infarction affecting left non-dominant side: Secondary | ICD-10-CM | POA: Diagnosis not present

## 2021-07-23 DIAGNOSIS — M21372 Foot drop, left foot: Secondary | ICD-10-CM | POA: Diagnosis not present

## 2021-07-23 DIAGNOSIS — N2581 Secondary hyperparathyroidism of renal origin: Secondary | ICD-10-CM | POA: Diagnosis not present

## 2021-07-23 DIAGNOSIS — C3490 Malignant neoplasm of unspecified part of unspecified bronchus or lung: Secondary | ICD-10-CM | POA: Diagnosis not present

## 2021-07-23 DIAGNOSIS — D631 Anemia in chronic kidney disease: Secondary | ICD-10-CM | POA: Diagnosis not present

## 2021-07-23 DIAGNOSIS — N184 Chronic kidney disease, stage 4 (severe): Secondary | ICD-10-CM | POA: Diagnosis not present

## 2021-07-23 DIAGNOSIS — N179 Acute kidney failure, unspecified: Secondary | ICD-10-CM | POA: Diagnosis not present

## 2021-07-23 DIAGNOSIS — E119 Type 2 diabetes mellitus without complications: Secondary | ICD-10-CM | POA: Diagnosis not present

## 2021-07-23 DIAGNOSIS — B9561 Methicillin susceptible Staphylococcus aureus infection as the cause of diseases classified elsewhere: Secondary | ICD-10-CM | POA: Diagnosis not present

## 2021-07-23 DIAGNOSIS — R7881 Bacteremia: Secondary | ICD-10-CM | POA: Diagnosis not present

## 2021-07-23 NOTE — Telephone Encounter (Signed)
Called patient regarding upcoming July appointment, patient has been called and notified. 

## 2021-07-24 ENCOUNTER — Telehealth: Payer: Self-pay

## 2021-07-26 DIAGNOSIS — C3491 Malignant neoplasm of unspecified part of right bronchus or lung: Secondary | ICD-10-CM | POA: Diagnosis not present

## 2021-07-26 DIAGNOSIS — I2510 Atherosclerotic heart disease of native coronary artery without angina pectoris: Secondary | ICD-10-CM | POA: Diagnosis not present

## 2021-07-26 DIAGNOSIS — E119 Type 2 diabetes mellitus without complications: Secondary | ICD-10-CM | POA: Diagnosis not present

## 2021-07-26 DIAGNOSIS — I69354 Hemiplegia and hemiparesis following cerebral infarction affecting left non-dominant side: Secondary | ICD-10-CM | POA: Diagnosis not present

## 2021-07-26 DIAGNOSIS — Z9181 History of falling: Secondary | ICD-10-CM | POA: Diagnosis not present

## 2021-07-26 DIAGNOSIS — M109 Gout, unspecified: Secondary | ICD-10-CM | POA: Diagnosis not present

## 2021-07-26 DIAGNOSIS — I252 Old myocardial infarction: Secondary | ICD-10-CM | POA: Diagnosis not present

## 2021-07-26 DIAGNOSIS — Z7902 Long term (current) use of antithrombotics/antiplatelets: Secondary | ICD-10-CM | POA: Diagnosis not present

## 2021-07-26 DIAGNOSIS — D630 Anemia in neoplastic disease: Secondary | ICD-10-CM | POA: Diagnosis not present

## 2021-07-26 DIAGNOSIS — I10 Essential (primary) hypertension: Secondary | ICD-10-CM | POA: Diagnosis not present

## 2021-07-26 DIAGNOSIS — I824Z3 Acute embolism and thrombosis of unspecified deep veins of distal lower extremity, bilateral: Secondary | ICD-10-CM | POA: Diagnosis not present

## 2021-07-26 DIAGNOSIS — Z7984 Long term (current) use of oral hypoglycemic drugs: Secondary | ICD-10-CM | POA: Diagnosis not present

## 2021-07-26 DIAGNOSIS — M1990 Unspecified osteoarthritis, unspecified site: Secondary | ICD-10-CM | POA: Diagnosis not present

## 2021-07-27 DIAGNOSIS — M1990 Unspecified osteoarthritis, unspecified site: Secondary | ICD-10-CM | POA: Diagnosis not present

## 2021-07-27 DIAGNOSIS — M109 Gout, unspecified: Secondary | ICD-10-CM | POA: Diagnosis not present

## 2021-07-27 DIAGNOSIS — I69354 Hemiplegia and hemiparesis following cerebral infarction affecting left non-dominant side: Secondary | ICD-10-CM | POA: Diagnosis not present

## 2021-07-27 DIAGNOSIS — I10 Essential (primary) hypertension: Secondary | ICD-10-CM | POA: Diagnosis not present

## 2021-07-27 DIAGNOSIS — Z9181 History of falling: Secondary | ICD-10-CM | POA: Diagnosis not present

## 2021-07-27 DIAGNOSIS — C3491 Malignant neoplasm of unspecified part of right bronchus or lung: Secondary | ICD-10-CM | POA: Diagnosis not present

## 2021-07-27 DIAGNOSIS — I2510 Atherosclerotic heart disease of native coronary artery without angina pectoris: Secondary | ICD-10-CM | POA: Diagnosis not present

## 2021-07-27 DIAGNOSIS — D630 Anemia in neoplastic disease: Secondary | ICD-10-CM | POA: Diagnosis not present

## 2021-07-27 DIAGNOSIS — I824Z3 Acute embolism and thrombosis of unspecified deep veins of distal lower extremity, bilateral: Secondary | ICD-10-CM | POA: Diagnosis not present

## 2021-07-27 DIAGNOSIS — Z7984 Long term (current) use of oral hypoglycemic drugs: Secondary | ICD-10-CM | POA: Diagnosis not present

## 2021-07-27 DIAGNOSIS — Z7902 Long term (current) use of antithrombotics/antiplatelets: Secondary | ICD-10-CM | POA: Diagnosis not present

## 2021-07-27 DIAGNOSIS — E119 Type 2 diabetes mellitus without complications: Secondary | ICD-10-CM | POA: Diagnosis not present

## 2021-07-27 DIAGNOSIS — I252 Old myocardial infarction: Secondary | ICD-10-CM | POA: Diagnosis not present

## 2021-07-28 DIAGNOSIS — C3490 Malignant neoplasm of unspecified part of unspecified bronchus or lung: Secondary | ICD-10-CM | POA: Diagnosis not present

## 2021-07-28 DIAGNOSIS — I6340 Cerebral infarction due to embolism of unspecified cerebral artery: Secondary | ICD-10-CM | POA: Diagnosis not present

## 2021-07-29 ENCOUNTER — Encounter: Payer: Self-pay | Admitting: Family Medicine

## 2021-07-29 DIAGNOSIS — I82409 Acute embolism and thrombosis of unspecified deep veins of unspecified lower extremity: Secondary | ICD-10-CM

## 2021-08-01 DIAGNOSIS — E119 Type 2 diabetes mellitus without complications: Secondary | ICD-10-CM | POA: Diagnosis not present

## 2021-08-01 DIAGNOSIS — I252 Old myocardial infarction: Secondary | ICD-10-CM | POA: Diagnosis not present

## 2021-08-01 DIAGNOSIS — D630 Anemia in neoplastic disease: Secondary | ICD-10-CM | POA: Diagnosis not present

## 2021-08-01 DIAGNOSIS — Z9181 History of falling: Secondary | ICD-10-CM | POA: Diagnosis not present

## 2021-08-01 DIAGNOSIS — Z7984 Long term (current) use of oral hypoglycemic drugs: Secondary | ICD-10-CM | POA: Diagnosis not present

## 2021-08-01 DIAGNOSIS — Z7902 Long term (current) use of antithrombotics/antiplatelets: Secondary | ICD-10-CM | POA: Diagnosis not present

## 2021-08-01 DIAGNOSIS — M109 Gout, unspecified: Secondary | ICD-10-CM | POA: Diagnosis not present

## 2021-08-01 DIAGNOSIS — M1990 Unspecified osteoarthritis, unspecified site: Secondary | ICD-10-CM | POA: Diagnosis not present

## 2021-08-01 DIAGNOSIS — I824Z3 Acute embolism and thrombosis of unspecified deep veins of distal lower extremity, bilateral: Secondary | ICD-10-CM | POA: Diagnosis not present

## 2021-08-01 DIAGNOSIS — I69354 Hemiplegia and hemiparesis following cerebral infarction affecting left non-dominant side: Secondary | ICD-10-CM | POA: Diagnosis not present

## 2021-08-01 DIAGNOSIS — I10 Essential (primary) hypertension: Secondary | ICD-10-CM | POA: Diagnosis not present

## 2021-08-01 DIAGNOSIS — I2510 Atherosclerotic heart disease of native coronary artery without angina pectoris: Secondary | ICD-10-CM | POA: Diagnosis not present

## 2021-08-01 DIAGNOSIS — C3491 Malignant neoplasm of unspecified part of right bronchus or lung: Secondary | ICD-10-CM | POA: Diagnosis not present

## 2021-08-02 ENCOUNTER — Ambulatory Visit (HOSPITAL_COMMUNITY)
Admission: RE | Admit: 2021-08-02 | Discharge: 2021-08-02 | Disposition: A | Discharge status: Home / Self Care | Payer: Medicare Other | Source: Ambulatory Visit | Attending: Internal Medicine | Admitting: Internal Medicine

## 2021-08-02 ENCOUNTER — Other Ambulatory Visit: Payer: Self-pay

## 2021-08-02 ENCOUNTER — Inpatient Hospital Stay: Payer: Medicare Other | Attending: Physician Assistant

## 2021-08-02 ENCOUNTER — Other Ambulatory Visit: Payer: Self-pay | Admitting: Internal Medicine

## 2021-08-02 DIAGNOSIS — Z95828 Presence of other vascular implants and grafts: Secondary | ICD-10-CM | POA: Insufficient documentation

## 2021-08-02 DIAGNOSIS — Z86718 Personal history of other venous thrombosis and embolism: Secondary | ICD-10-CM | POA: Insufficient documentation

## 2021-08-02 DIAGNOSIS — C7951 Secondary malignant neoplasm of bone: Secondary | ICD-10-CM | POA: Diagnosis not present

## 2021-08-02 DIAGNOSIS — C3490 Malignant neoplasm of unspecified part of unspecified bronchus or lung: Secondary | ICD-10-CM | POA: Insufficient documentation

## 2021-08-02 DIAGNOSIS — Z86711 Personal history of pulmonary embolism: Secondary | ICD-10-CM | POA: Diagnosis not present

## 2021-08-02 DIAGNOSIS — C3431 Malignant neoplasm of lower lobe, right bronchus or lung: Secondary | ICD-10-CM | POA: Diagnosis not present

## 2021-08-02 DIAGNOSIS — C3491 Malignant neoplasm of unspecified part of right bronchus or lung: Secondary | ICD-10-CM

## 2021-08-02 DIAGNOSIS — J90 Pleural effusion, not elsewhere classified: Secondary | ICD-10-CM | POA: Diagnosis not present

## 2021-08-02 DIAGNOSIS — N281 Cyst of kidney, acquired: Secondary | ICD-10-CM | POA: Diagnosis not present

## 2021-08-02 DIAGNOSIS — Z79899 Other long term (current) drug therapy: Secondary | ICD-10-CM | POA: Insufficient documentation

## 2021-08-02 DIAGNOSIS — Z8673 Personal history of transient ischemic attack (TIA), and cerebral infarction without residual deficits: Secondary | ICD-10-CM | POA: Insufficient documentation

## 2021-08-02 LAB — CBC WITH DIFFERENTIAL (CANCER CENTER ONLY)
Abs Immature Granulocytes: 0.04 10*3/uL (ref 0.00–0.07)
Basophils Absolute: 0 10*3/uL (ref 0.0–0.1)
Basophils Relative: 0 %
Eosinophils Absolute: 0.4 10*3/uL (ref 0.0–0.5)
Eosinophils Relative: 5 %
HCT: 23.4 % — ABNORMAL LOW (ref 39.0–52.0)
Hemoglobin: 7.8 g/dL — ABNORMAL LOW (ref 13.0–17.0)
Immature Granulocytes: 1 %
Lymphocytes Relative: 23 %
Lymphs Abs: 1.8 10*3/uL (ref 0.7–4.0)
MCH: 29.2 pg (ref 26.0–34.0)
MCHC: 33.3 g/dL (ref 30.0–36.0)
MCV: 87.6 fL (ref 80.0–100.0)
Monocytes Absolute: 0.7 10*3/uL (ref 0.1–1.0)
Monocytes Relative: 8 %
Neutro Abs: 5 10*3/uL (ref 1.7–7.7)
Neutrophils Relative %: 63 %
Platelet Count: 385 10*3/uL (ref 150–400)
RBC: 2.67 MIL/uL — ABNORMAL LOW (ref 4.22–5.81)
RDW: 16.8 % — ABNORMAL HIGH (ref 11.5–15.5)
WBC Count: 7.9 10*3/uL (ref 4.0–10.5)
nRBC: 0 % (ref 0.0–0.2)

## 2021-08-02 LAB — CMP (CANCER CENTER ONLY)
ALT: 5 U/L (ref 0–44)
AST: 13 U/L — ABNORMAL LOW (ref 15–41)
Albumin: 3.2 g/dL — ABNORMAL LOW (ref 3.5–5.0)
Alkaline Phosphatase: 100 U/L (ref 38–126)
Anion gap: 6 (ref 5–15)
BUN: 23 mg/dL (ref 8–23)
CO2: 27 mmol/L (ref 22–32)
Calcium: 8.9 mg/dL (ref 8.9–10.3)
Chloride: 106 mmol/L (ref 98–111)
Creatinine: 1.46 mg/dL — ABNORMAL HIGH (ref 0.61–1.24)
GFR, Estimated: 48 mL/min — ABNORMAL LOW (ref >60)
Glucose, Bld: 126 mg/dL — ABNORMAL HIGH (ref 70–99)
Potassium: 4 mmol/L (ref 3.5–5.1)
Sodium: 139 mmol/L (ref 135–145)
Total Bilirubin: 1.5 mg/dL — ABNORMAL HIGH (ref 0.3–1.2)
Total Protein: 6.9 g/dL (ref 6.5–8.1)

## 2021-08-02 NOTE — Progress Notes (Signed)
This encounter was created in error - please disregard.

## 2021-08-03 ENCOUNTER — Other Ambulatory Visit (HOSPITAL_COMMUNITY): Payer: Self-pay

## 2021-08-03 ENCOUNTER — Telehealth: Payer: Self-pay

## 2021-08-03 ENCOUNTER — Other Ambulatory Visit: Payer: Self-pay | Admitting: Family Medicine

## 2021-08-03 NOTE — Telephone Encounter (Signed)
Spoke with pt regarding PET scan scheduled for 08/15/21 at 11 AM with arrival at 10:30 AM NPO, including gum and candy, 6 hours prior. Avoid carbs the night before. Patient scheduled with Cassie PA on 08/20/21 for review of scan.

## 2021-08-06 ENCOUNTER — Ambulatory Visit: Payer: Medicare Other | Admitting: Internal Medicine

## 2021-08-06 ENCOUNTER — Encounter: Payer: Self-pay | Admitting: Internal Medicine

## 2021-08-07 ENCOUNTER — Ambulatory Visit: Payer: Medicare Other | Admitting: Internal Medicine

## 2021-08-08 ENCOUNTER — Other Ambulatory Visit (HOSPITAL_COMMUNITY): Payer: Self-pay

## 2021-08-08 DIAGNOSIS — Z86718 Personal history of other venous thrombosis and embolism: Secondary | ICD-10-CM | POA: Diagnosis not present

## 2021-08-08 DIAGNOSIS — I252 Old myocardial infarction: Secondary | ICD-10-CM | POA: Diagnosis not present

## 2021-08-08 DIAGNOSIS — C3491 Malignant neoplasm of unspecified part of right bronchus or lung: Secondary | ICD-10-CM | POA: Diagnosis not present

## 2021-08-08 DIAGNOSIS — D630 Anemia in neoplastic disease: Secondary | ICD-10-CM | POA: Diagnosis not present

## 2021-08-08 DIAGNOSIS — Z7902 Long term (current) use of antithrombotics/antiplatelets: Secondary | ICD-10-CM | POA: Diagnosis not present

## 2021-08-08 DIAGNOSIS — E1142 Type 2 diabetes mellitus with diabetic polyneuropathy: Secondary | ICD-10-CM | POA: Diagnosis not present

## 2021-08-08 DIAGNOSIS — M1990 Unspecified osteoarthritis, unspecified site: Secondary | ICD-10-CM | POA: Diagnosis not present

## 2021-08-08 DIAGNOSIS — I69990 Apraxia following unspecified cerebrovascular disease: Secondary | ICD-10-CM | POA: Diagnosis not present

## 2021-08-08 DIAGNOSIS — M21372 Foot drop, left foot: Secondary | ICD-10-CM | POA: Diagnosis not present

## 2021-08-08 DIAGNOSIS — I69354 Hemiplegia and hemiparesis following cerebral infarction affecting left non-dominant side: Secondary | ICD-10-CM | POA: Diagnosis not present

## 2021-08-08 DIAGNOSIS — I2510 Atherosclerotic heart disease of native coronary artery without angina pectoris: Secondary | ICD-10-CM | POA: Diagnosis not present

## 2021-08-08 DIAGNOSIS — M109 Gout, unspecified: Secondary | ICD-10-CM | POA: Diagnosis not present

## 2021-08-08 DIAGNOSIS — I10 Essential (primary) hypertension: Secondary | ICD-10-CM | POA: Diagnosis not present

## 2021-08-08 DIAGNOSIS — Z9181 History of falling: Secondary | ICD-10-CM | POA: Diagnosis not present

## 2021-08-09 DIAGNOSIS — D630 Anemia in neoplastic disease: Secondary | ICD-10-CM | POA: Diagnosis not present

## 2021-08-09 DIAGNOSIS — Z9181 History of falling: Secondary | ICD-10-CM | POA: Diagnosis not present

## 2021-08-09 DIAGNOSIS — M21372 Foot drop, left foot: Secondary | ICD-10-CM | POA: Diagnosis not present

## 2021-08-09 DIAGNOSIS — M1990 Unspecified osteoarthritis, unspecified site: Secondary | ICD-10-CM | POA: Diagnosis not present

## 2021-08-09 DIAGNOSIS — M109 Gout, unspecified: Secondary | ICD-10-CM | POA: Diagnosis not present

## 2021-08-09 DIAGNOSIS — Z86718 Personal history of other venous thrombosis and embolism: Secondary | ICD-10-CM | POA: Diagnosis not present

## 2021-08-09 DIAGNOSIS — I10 Essential (primary) hypertension: Secondary | ICD-10-CM | POA: Diagnosis not present

## 2021-08-09 DIAGNOSIS — I69990 Apraxia following unspecified cerebrovascular disease: Secondary | ICD-10-CM | POA: Diagnosis not present

## 2021-08-09 DIAGNOSIS — I2510 Atherosclerotic heart disease of native coronary artery without angina pectoris: Secondary | ICD-10-CM | POA: Diagnosis not present

## 2021-08-09 DIAGNOSIS — C3491 Malignant neoplasm of unspecified part of right bronchus or lung: Secondary | ICD-10-CM | POA: Diagnosis not present

## 2021-08-09 DIAGNOSIS — Z7902 Long term (current) use of antithrombotics/antiplatelets: Secondary | ICD-10-CM | POA: Diagnosis not present

## 2021-08-09 DIAGNOSIS — E1142 Type 2 diabetes mellitus with diabetic polyneuropathy: Secondary | ICD-10-CM | POA: Diagnosis not present

## 2021-08-09 DIAGNOSIS — I69354 Hemiplegia and hemiparesis following cerebral infarction affecting left non-dominant side: Secondary | ICD-10-CM | POA: Diagnosis not present

## 2021-08-09 DIAGNOSIS — I252 Old myocardial infarction: Secondary | ICD-10-CM | POA: Diagnosis not present

## 2021-08-10 DIAGNOSIS — S7010XA Contusion of unspecified thigh, initial encounter: Secondary | ICD-10-CM | POA: Diagnosis not present

## 2021-08-10 DIAGNOSIS — M79606 Pain in leg, unspecified: Secondary | ICD-10-CM | POA: Diagnosis not present

## 2021-08-13 ENCOUNTER — Ambulatory Visit (INDEPENDENT_AMBULATORY_CARE_PROVIDER_SITE_OTHER): Payer: Medicare Other | Admitting: Internal Medicine

## 2021-08-13 ENCOUNTER — Ambulatory Visit: Payer: Medicare Other | Admitting: Internal Medicine

## 2021-08-13 ENCOUNTER — Other Ambulatory Visit: Payer: Self-pay | Admitting: Family Medicine

## 2021-08-13 ENCOUNTER — Other Ambulatory Visit: Payer: Self-pay

## 2021-08-13 ENCOUNTER — Other Ambulatory Visit (HOSPITAL_COMMUNITY): Payer: Medicare Other

## 2021-08-13 ENCOUNTER — Encounter: Payer: Self-pay | Admitting: Internal Medicine

## 2021-08-13 VITALS — BP 144/65 | HR 76 | Temp 98.5°F | Ht 71.0 in | Wt 162.0 lb

## 2021-08-13 DIAGNOSIS — R7881 Bacteremia: Secondary | ICD-10-CM

## 2021-08-13 DIAGNOSIS — B9561 Methicillin susceptible Staphylococcus aureus infection as the cause of diseases classified elsewhere: Secondary | ICD-10-CM

## 2021-08-13 NOTE — Progress Notes (Signed)
Patient Active Problem List   Diagnosis Date Noted   Renal insufficiency 07/04/2021   Foot drop, left foot 07/04/2021   MSSA bacteremia 06/21/2021   Thrombophlebitis arm 06/21/2021   Non-small cell carcinoma of lung, stage 4 ([place]) 06/15/2021   Encounter for antineoplastic chemotherapy 06/13/2021   Deep venous embolism and thrombosis ([place]) 05/16/2021   Groin swelling 05/16/2021   Adenocarcinoma of right lung, stage 4 ([place]) 04/09/2021   Cerebral embolism with cerebral infarction 03/18/2021   Adenopathy 03/06/2021   History of cardiac arrest 02/19/2021   Status post coronary artery stent placement    Coronary artery disease involving native heart    Pulmonary nodules/lesions, multiple    Pulmonary embolus ([place]) 02/03/2021   Anemia 09/06/2015   GERD (gastroesophageal reflux disease) 07/05/2013   Arthritis 02/26/2010   Gout 11/06/2009   Diabetes mellitus, type II ([place]) 09/22/2009   Hyperlipidemia 09/22/2009   Essential hypertension, benign 09/22/2009    [name] Medications  New Prescriptions   No medications on file  Previous Medications   ACETAMINOPHEN (TYLENOL) 650 MG CR TABLET    Take 650 mg by mouth every 8 (eight) hours as needed for pain.   ALECTINIB (ALECENSA) 150 MG CAPSULE    Take 4 capsules (600 mg total) by mouth 2 (two) times daily with a meal.   BUSPIRONE (BUSPAR) 5 MG TABLET    Take 1 tablet (5 mg total) by mouth 2 (two) times daily.   CLOPIDOGREL (PLAVIX) 75 MG TABLET    Take 1 tablet (75 mg total) by mouth daily.   FERROUS SULFATE 324 (65 FE) MG TBEC    Take 324 mg by mouth daily.   POLYETHYLENE GLYCOL POWDER (GLYCOLAX/MIRALAX) 17 GM/SCOOP POWDER    Take 17 g by mouth daily.   RAMELTEON (ROZEREM) 8 MG TABLET    TAKE 1 TABLET BY MOUTH AT BEDTIME   ROSUVASTATIN (CRESTOR) 5 MG TABLET    Take 1 tablet (5 mg total) by mouth daily.   TROLAMINE SALICYLATE (ASPER-FLEX) 10 % CREAM    Apply 1 application topically as needed for muscle pain.  Modified  Medications   No medications on file  Discontinued Medications   No medications on file    Subjective: 81 year old male with NSCLC stage IV on alectinib presents for hospital follow-up of hospital associate MSSA and bacteremia secondary thrombophlebitis.  He was admitted to [facility] 5/19 - 5/29 after developing fever found to have MSSA bacteremia.  Blood cultures on 5/23 grew MSSA, for cleared on 5/25.  Patient completed cefazolin x4 weeks on 6/21. Today: Denies fever chills. Reports he may have an inguinal hernia, no plans for surgery, plans follow with  PCP.  Review of Systems: Review of Systems  All other systems reviewed and are negative.   Past Medical History:  Diagnosis Date   Diabetes mellitus without complication ([place])    High cholesterol    Hypertension    lung ca    Normal nuclear stress test 01/29/2008   stress perfusion study apparently in 2010 in [facility] which he said was negative.    Social History   Tobacco Use   Smoking status: Never   Smokeless tobacco: Never  Substance Use Topics   Alcohol use: No   Drug use: No    Family History  Problem Relation Age of Onset   Coronary artery disease Father    Coronary artery disease Mother     Allergies  Allergen Reactions  Ace Inhibitors     REACTION: Cough   Iohexol     Severe ATN after CT with contrast May 2023 - creatinine peaked near 8. Did not require HD.    Health Maintenance  Topic Date Due   DTAP VACCINES (1) Never done   DTaP/Tdap/Td (1 - Tdap) Never done   TETANUS/TDAP  Never done   Zoster Vaccines- Shingrix (1 of 2) Never done   FOOT EXAM  10/27/2019   COVID-19 Vaccine (5 - Booster for Pfizer series) 08/01/2020   COLONOSCOPY (Pts 45-11yrs Insurance coverage will need to be confirmed)  10/29/2020   OPHTHALMOLOGY EXAM  11/01/2020   INFLUENZA VACCINE  08/28/2021   HEMOGLOBIN A1C  01/03/2022   Pneumonia Vaccine 20+ Years old  Completed   HPV VACCINES  Aged Out     Objective:  There were no vitals filed for this visit. There is no height or weight on file to calculate BMI.  Physical Exam Constitutional:      General: He is not in acute distress.    Appearance: He is normal weight. He is not toxic-appearing.  HENT:     Head: Normocephalic and atraumatic.     Right Ear: External ear normal.     Left Ear: External ear normal.     Nose: No congestion or rhinorrhea.     Mouth/Throat:     Mouth: Mucous membranes are moist.     Pharynx: Oropharynx is clear.  Eyes:     Extraocular Movements: Extraocular movements intact.     Conjunctiva/sclera: Conjunctivae normal.     Pupils: Pupils are equal, round, and reactive to light.  Cardiovascular:     Rate and Rhythm: Normal rate and regular rhythm.     Heart sounds: No murmur heard.    No friction rub. No gallop.  Pulmonary:     Effort: Pulmonary effort is normal.     Breath sounds: Normal breath sounds.  Abdominal:     General: Abdomen is flat. Bowel sounds are normal.     Palpations: Abdomen is soft.  Musculoskeletal:        General: No swelling. Normal range of motion.     Cervical back: Normal range of motion and neck supple.  Skin:    General: Skin is warm and dry.  Neurological:     General: No focal deficit present.     Mental Status: He is oriented to person, place, and time.  Psychiatric:        Mood and Affect: Mood normal.     Lab Results Lab Results  Component Value Date   WBC 7.9 08/02/2021   HGB 7.8 (L) 08/02/2021   HCT 23.4 (L) 08/02/2021   MCV 87.6 08/02/2021   PLT 385 08/02/2021    Lab Results  Component Value Date   CREATININE 1.46 (H) 08/02/2021   BUN 23 08/02/2021   NA 139 08/02/2021   K 4.0 08/02/2021   CL 106 08/02/2021   CO2 27 08/02/2021    Lab Results  Component Value Date   ALT 5 08/02/2021   AST 13 (L) 08/02/2021   ALKPHOS 100 08/02/2021   BILITOT 1.5 (H) 08/02/2021    Lab Results  Component Value Date   CHOL 133 02/06/2021   HDL 43  02/06/2021   LDLCALC 71 02/06/2021   LDLDIRECT 75.9 03/17/2021   TRIG 97 02/06/2021   CHOLHDL 3.1 02/06/2021   No results found for: "LABRPR", "RPRTITER" No results found for: "HIV1RNAQUANT", "HIV1RNAVL", "CD4TABS"   A/P #  MSSA bacteremia secondary to have phlebitis - Treated with the cefazolin x4 weeks EOT 6/21 - PICC out - I follow-up with infectious disease as needed   Laurice Record, [place] 08/13/2021, 9:14 AM

## 2021-08-14 ENCOUNTER — Telehealth: Payer: Self-pay

## 2021-08-14 DIAGNOSIS — M1990 Unspecified osteoarthritis, unspecified site: Secondary | ICD-10-CM | POA: Diagnosis not present

## 2021-08-14 DIAGNOSIS — I2510 Atherosclerotic heart disease of native coronary artery without angina pectoris: Secondary | ICD-10-CM | POA: Diagnosis not present

## 2021-08-14 DIAGNOSIS — I252 Old myocardial infarction: Secondary | ICD-10-CM | POA: Diagnosis not present

## 2021-08-14 DIAGNOSIS — Z7902 Long term (current) use of antithrombotics/antiplatelets: Secondary | ICD-10-CM | POA: Diagnosis not present

## 2021-08-14 DIAGNOSIS — M109 Gout, unspecified: Secondary | ICD-10-CM | POA: Diagnosis not present

## 2021-08-14 DIAGNOSIS — Z9181 History of falling: Secondary | ICD-10-CM | POA: Diagnosis not present

## 2021-08-14 DIAGNOSIS — I69990 Apraxia following unspecified cerebrovascular disease: Secondary | ICD-10-CM | POA: Diagnosis not present

## 2021-08-14 DIAGNOSIS — E1142 Type 2 diabetes mellitus with diabetic polyneuropathy: Secondary | ICD-10-CM | POA: Diagnosis not present

## 2021-08-14 DIAGNOSIS — I69354 Hemiplegia and hemiparesis following cerebral infarction affecting left non-dominant side: Secondary | ICD-10-CM | POA: Diagnosis not present

## 2021-08-14 DIAGNOSIS — M21372 Foot drop, left foot: Secondary | ICD-10-CM | POA: Diagnosis not present

## 2021-08-14 DIAGNOSIS — C3491 Malignant neoplasm of unspecified part of right bronchus or lung: Secondary | ICD-10-CM | POA: Diagnosis not present

## 2021-08-14 DIAGNOSIS — I10 Essential (primary) hypertension: Secondary | ICD-10-CM | POA: Diagnosis not present

## 2021-08-14 DIAGNOSIS — D630 Anemia in neoplastic disease: Secondary | ICD-10-CM | POA: Diagnosis not present

## 2021-08-14 DIAGNOSIS — Z86718 Personal history of other venous thrombosis and embolism: Secondary | ICD-10-CM | POA: Diagnosis not present

## 2021-08-14 NOTE — Telephone Encounter (Signed)
Received phone call from [place], [place] assistant requesting verbal orders for nursing assessment. Verbal orders given per protocol.   She reports that during visit, patient was also noted to have BLE edema. Denies [facility] or other symptoms.   Patient has follow up visit with PCP on 7/25.   FYI to PCP.   Talbot Grumbling, RN

## 2021-08-15 ENCOUNTER — Ambulatory Visit (HOSPITAL_COMMUNITY)
Admission: RE | Admit: 2021-08-15 | Discharge: 2021-08-15 | Disposition: A | Discharge status: Home / Self Care | Payer: Medicare Other | Source: Ambulatory Visit | Attending: Internal Medicine | Admitting: Internal Medicine

## 2021-08-15 DIAGNOSIS — C3491 Malignant neoplasm of unspecified part of right bronchus or lung: Secondary | ICD-10-CM | POA: Insufficient documentation

## 2021-08-15 DIAGNOSIS — R911 Solitary pulmonary nodule: Secondary | ICD-10-CM | POA: Diagnosis not present

## 2021-08-15 LAB — GLUCOSE, CAPILLARY: Glucose-Capillary: 180 mg/dL — ABNORMAL HIGH (ref 70–99)

## 2021-08-15 MED ORDER — FLUDEOXYGLUCOSE F - 18 (FDG) INJECTION
8.1000 | Freq: Once | INTRAVENOUS | Status: AC
  Administered 2021-08-15: 8 via INTRAVENOUS

## 2021-08-15 NOTE — Progress Notes (Signed)
[facility] OFFICE PROGRESS NOTE  Lind Covert, MD [place] [address]  DIAGNOSIS:  Stage IV (T1b, N3, M1 C) non-small cell lung cancer, adenocarcinoma with positive ALK gene translocation diagnosed in February 2023.  PRIOR THERAPY: None  CURRENT THERAPY:  Alecensa (Alectinib) 600 mg p.o. twice daily.  First dose April 11, 2021.  Status post 4 months of treatment.  INTERVAL HISTORY: Arthur [name] 81 y.o. male returns to the clinic today for a follow-up visit accompanied by his son.  The patient was last seen by Dr. Julien Nordmann in the clinic on 07/18/2021.  The patient is currently undergoing targeted treatment with Alecensa.  He has been on this for approximately 4 months.  The patient is feeling fairly well today with no concerning complaints. The patient denies any recent fever, chills, night sweats, or unexplained weight loss. He gained weight since last being seen. He reports his breathing is "good".  He denies any chest pain or hemoptysis. He denies cough. He sometimes gets dyspnea on exertion but nothing like before. He denies any nausea, vomiting, diarrhea, or constipation.  Denies any headache or visual changes. He had an IVC filter placed at [facility] for DVT. They have an appointment on 09/15/21 to discuss removing this. The patient also has a complicated history with anemia. He is also on plavix for CVA. They are considering taking the IVC filter out next month since it will have been in for 4 months. The patient does not have a neurologist for his stroke. He also is having continued foot drop. The patient recently had a restaging CT scan performed.  He is here today for evaluation and to review his scan results.*    MEDICAL HISTORY: Past Medical History:  Diagnosis Date   Diabetes mellitus without complication ([place])    High cholesterol    Hypertension    lung ca    Normal nuclear stress test 01/29/2008   stress perfusion study  apparently in 2010 in [facility] which he said was negative.    ALLERGIES:  is allergic to ace inhibitors and iohexol.  MEDICATIONS:  Current Outpatient Medications  Medication Sig Dispense Refill   acetaminophen (TYLENOL) 650 MG CR tablet Take 650 mg by mouth every 8 (eight) hours as needed for pain.     alectinib (ALECENSA) 150 MG capsule Take 4 capsules (600 mg total) by mouth 2 (two) times daily with a meal. 240 capsule 3   busPIRone (BUSPAR) 5 MG tablet Take 1 tablet (5 mg total) by mouth 2 (two) times daily. 60 tablet 1   clopidogrel (PLAVIX) 75 MG tablet Take 1 tablet (75 mg total) by mouth daily. 90 tablet 1   ferrous sulfate 324 (65 Fe) MG TBEC Take 324 mg by mouth daily. 30 tablet    polyethylene glycol powder (GLYCOLAX/MIRALAX) 17 GM/SCOOP powder Take 17 g by mouth daily. 3350 g 1   ramelteon (ROZEREM) 8 MG tablet TAKE 1 TABLET BY MOUTH AT BEDTIME 30 tablet 0   rosuvastatin (CRESTOR) 5 MG tablet Take 1 tablet by mouth once daily 90 tablet 0   trolamine salicylate (ASPER-FLEX) 10 % cream Apply 1 application topically as needed for muscle pain. 85 g 0   No current facility-administered medications for this visit.    SURGICAL HISTORY:  Past Surgical History:  Procedure Laterality Date   CATARACT EXTRACTION, BILATERAL  2006   CORONARY STENT INTERVENTION N/A 02/06/2021   Procedure: CORONARY STENT INTERVENTION;  Surgeon: Jettie Booze,  MD;  Location: [facility];  Service: Cardiovascular;  Laterality: N/A;   FINE NEEDLE ASPIRATION  03/21/2021   Procedure: FINE NEEDLE ASPIRATION;  Surgeon: Garner Nash, DO;  Location: [facility];  Service: Pulmonary;;   INTRAVASCULAR ULTRASOUND/IVUS N/A 02/06/2021   Procedure: Intravascular Ultrasound/IVUS;  Surgeon: Jettie Booze, MD;  Location: [facility];  Service: Cardiovascular;  Laterality: N/A;   KNEE SURGERY  2005   LEFT HEART CATH AND CORONARY ANGIOGRAPHY N/A 02/06/2021   Procedure: LEFT HEART CATH  AND CORONARY ANGIOGRAPHY;  Surgeon: Jettie Booze, MD;  Location: [facility];  Service: Cardiovascular;  Laterality: N/A;   VIDEO BRONCHOSCOPY WITH ENDOBRONCHIAL ULTRASOUND Bilateral 03/21/2021   Procedure: VIDEO BRONCHOSCOPY WITH ENDOBRONCHIAL ULTRASOUND;  Surgeon: Garner Nash, DO;  Location: [facility];  Service: Pulmonary;  Laterality: Bilateral;    REVIEW OF SYSTEMS:   Review of Systems  Constitutional: Negative for appetite change, chills, fatigue, fever and unexpected weight change.  HENT:   Negative for mouth sores, nosebleeds, sore throat and trouble swallowing.   Eyes: Negative for eye problems and icterus.  Respiratory: Negative for cough, hemoptysis, shortness of breath and wheezing.   Cardiovascular: Negative for chest pain and leg swelling.  Gastrointestinal: Negative for abdominal pain, constipation, diarrhea, nausea and vomiting.  Genitourinary: Negative for bladder incontinence, difficulty urinating, dysuria, frequency and hematuria.   Musculoskeletal: Negative for back pain, gait problem, neck pain and neck stiffness.  Skin: Negative for itching and rash.  Neurological: Negative for dizziness, extremity weakness, gait problem, headaches, light-headedness and seizures.  Hematological: Negative for adenopathy. Does not bruise/bleed easily.  Psychiatric/Behavioral: Negative for confusion, depression and sleep disturbance. The patient is not nervous/anxious.     PHYSICAL EXAMINATION:  Blood pressure (!) 136/49, pulse 73, temperature 97.9 F (36.6 C), resp. rate 18, weight 164 lb 3.2 oz (74.5 kg), SpO2 100 %.  ECOG PERFORMANCE STATUS: 1  Physical Exam  Constitutional: Oriented to person, place, and time and well-developed, well-nourished, and in no distress.  HENT:  Head: Normocephalic and atraumatic.  Mouth/Throat: Oropharynx is clear and moist. No oropharyngeal exudate.  Eyes: Conjunctivae are normal. Right eye exhibits no discharge. Left eye  exhibits no discharge. No scleral icterus.  Neck: Normal range of motion. Neck supple.  Cardiovascular: Normal rate, regular rhythm, systolic murmur noted and intact distal pulses.   Pulmonary/Chest: Effort normal and breath sounds normal. No respiratory distress. No wheezes. No rales.  Abdominal: Soft. Bowel sounds are normal. Exhibits no distension and no mass. There is no tenderness.  Musculoskeletal: Normal range of motion. Exhibits no edema.  Lymphadenopathy:    No cervical adenopathy.  Neurological: Alert and oriented to person, place, and time. Exhibits normal muscle tone. 6 digits noted on toes. Boot on left foot. Examined in the wheelchair.  Skin: Skin is warm and dry. No rash noted. Not diaphoretic. No erythema. No pallor.  Psychiatric: Mood, memory and judgment normal.  Vitals reviewed.  LABORATORY DATA: Lab Results  Component Value Date   WBC 7.9 08/02/2021   HGB 7.8 (L) 08/02/2021   HCT 23.4 (L) 08/02/2021   MCV 87.6 08/02/2021   PLT 385 08/02/2021      Chemistry      Component Value Date/Time   NA 139 08/02/2021 1018   NA 140 07/17/2021 1103   K 4.0 08/02/2021 1018   CL 106 08/02/2021 1018   CO2 27 08/02/2021 1018   BUN 23 08/02/2021 1018   BUN 14 07/17/2021 1103  CREATININE 1.46 (H) 08/02/2021 1018   CREATININE 1.18 11/15/2015 1114      Component Value Date/Time   CALCIUM 8.9 08/02/2021 1018   ALKPHOS 100 08/02/2021 1018   AST 13 (L) 08/02/2021 1018   ALT 5 08/02/2021 1018   BILITOT 1.5 (H) 08/02/2021 1018       RADIOGRAPHIC STUDIES:  NM PET Image Initial (PI) Skull Base To Thigh (F-18 FDG)  Result Date: 08/16/2021 CLINICAL DATA:  Subsequent treatment strategy for non-small cell lung cancer. EXAM: NUCLEAR MEDICINE PET SKULL BASE TO THIGH TECHNIQUE: 8.0 mCi F-18 FDG was injected intravenously. Full-ring PET imaging was performed from the skull base to thigh after the radiotracer. CT data was obtained and used for attenuation correction and anatomic  localization. Fasting blood glucose: 180 mg/dl COMPARISON:  PET-CT 02/21/2021 and most recent CT scan 08/02/2021 FINDINGS: Mediastinal blood pool activity: SUV max 1.65 Liver activity: SUV max NA NECK: No hypermetabolic lymph nodes in the neck. Incidental CT findings: none CHEST: No enlarged or hypermetabolic mediastinal or hilar lymph nodes. Loculated right pleural fluid collection and small right pleural effusion but no hypermetabolic pleural lesions. No new pulmonary nodules. Stable 6 mm right lower lobe pulmonary nodule when compared to recent chest CT. This measured 18 mm on the prior PET-CT. Stable left pleural effusion. Incidental CT findings: Stable vascular calcifications. ABDOMEN/PELVIS: No findings suspicious for abdominal/pelvic metastatic disease. Incidental CT findings: Stable vascular disease. Stable IVC filter. Stable renal calculi and renal cysts. Stable large right inguinal hernia containing bowel and extending into the upper right scrotum. SKELETON: No findings suspicious for osseous metastatic disease. Incidental CT findings: none IMPRESSION: The PET-CT demonstrates an excellent response to treatment. No residual/recurrent or metastatic disease. Stable bilateral pleural effusions and significant interval decrease in size of the right lower lobe pulmonary nodule. Electronically Signed   By: Marijo Sanes M.D.   On: 08/16/2021 14:55   CT Chest Wo Contrast  Result Date: 08/02/2021 CLINICAL DATA:  33-year-old male presents for follow-up of non-small cell lung cancer with metastatic disease. * Tracking Code: BO * EXAM: CT CHEST, ABDOMEN AND PELVIS WITHOUT CONTRAST TECHNIQUE: Multidetector CT imaging of the chest, abdomen and pelvis was performed following the standard protocol without IV contrast. RADIATION DOSE REDUCTION: This exam was performed according to the departmental dose-optimization program which includes automated exposure control, adjustment of the mA and/or kV according to patient size  and/or use of iterative reconstruction technique. COMPARISON:  Jun 15, 2021. FINDINGS: CT CHEST FINDINGS Cardiovascular: Calcified plaque in the thoracic aorta. No aneurysmal dilation. Heart size mild to moderately enlarged. No pericardial effusion or nodularity. Signs of coronary artery disease of RIGHT and LEFT coronary circulation. Central pulmonary vasculature is of normal caliber. Limited assessment of cardiovascular structures given lack of intravenous contrast. Mediastinum/Nodes: No axillary, supraclavicular or gross hilar adenopathy. No mediastinal adenopathy with small lymph nodes showing increased density in the chest particularly in the AP window largest measuring 13 mm, with measured in a similar fashion is unchanged compared to previous imaging. Lungs/Pleura: Small bilateral pleural effusions are similar to previous imaging. Extrapleural lesion along the RIGHT posterior chest abutting medial RIGHT-sided ribs measuring 3.4 x 2.0 cm previously 2.8 x 1.6 cm. This shows low-density and is not associated with rib destruction despite enlargement since previous imaging Pulmonary nodule in the RIGHT lower lobe (image 76/505) 7 x 7 mm as compared to 12 x 7 mm. Diffuse interstitial thickening throughout the lower and mid chest is stable. Pleural thickening near the apex (image  62/504) 7 mm greatest thickness as compared to 8 mm greatest thickness. Discrete pleural lesion near the RIGHT lung apex measured on the previous exam adjacent to the RIGHT third and fourth rib medially is no longer evident though pleural thickening above this level persists. No new areas of pleural thickening. Musculoskeletal: See below for full musculoskeletal details. CT ABDOMEN PELVIS FINDINGS Hepatobiliary: Smooth hepatic contours. No visible hepatic lesion on noncontrast imaging. Cholelithiasis. Pancreas: Pancreatic calcification likely intraductal calcification associated with peripheral ductal dilation and atrophy without change.  This calcification measuring up to 15 mm. Spleen: Normal. Adrenals/Urinary Tract: Adrenal glands are normal. Nephrolithiasis on the RIGHT is similar to prior imaging. The renal cysts of the bilateral kidneys better characterized on prior imaging not grossly changed and not requiring dedicated follow-up imaging. Urinary bladder is collapsed. No hydronephrosis. Stomach/Bowel: Stool throughout much of the colon. No signs of bowel obstruction or acute bowel process. Very large periampullary duodenal diverticulum is unchanged and without signs of inflammation. Appendix is normal. Small bowel loops are contained within a RIGHT inguinal hernia as on previous imaging. There is some suggestion of slight increased mesenteric edema since the previous study but there is mild generalized mesenteric edema, significance uncertain and the area is incompletely imaged. Vascular/Lymphatic: IVC filter in situ. Aortic atherosclerosis without aneurysm. Reproductive: Unremarkable by CT. Other: No ascites. Musculoskeletal: Similar appearance of widespread bony metastatic disease with numerous sclerotic foci throughout the spine, pelvis and ribs. No frankly destructive bone finding or acute bone process. IMPRESSION: 1. Similar appearance of widespread bony metastatic disease. 2. Extrapleural and pleural disease in the RIGHT chest with increase in the dominant area along the RIGHT paraspinous region. There is a potential that this could represent a necrotic metastasis or loculated pleural fluid but it has enlarged. Could consider PET evaluation for further assessment as warranted. 3. RIGHT lower lobe pulmonary nodule has diminished in size still with diffuse interstitial thickening which is worse than in early May and similar to imaging from later in May of 2023. Lymphangitic tumor could have this appearance as could pulmonary edema. 4. Widespread bony metastatic disease with similar appearance to prior imaging. No destructive bone process  or acute bone finding. 5. No signs of nodal disease or solid organ disease at this time. 6. Mild to moderate increased edema associated with mesentery of the small bowel and of small-bowel loops within a RIGHT inguinal hernia. Correlate with worsening symptoms in this area. There is no sign of obstruction currently. This is a large RIGHT inguinal hernia and therefore may be at risk for obstruction. 7. Cholelithiasis without evidence of acute cholecystitis. 8. Intraductal pancreatic calcification associated with ductal dilation and peripheral atrophy similar to prior imaging. Aortic Atherosclerosis (ICD10-I70.0). These results will be called to the ordering clinician or representative by the Radiologist Assistant, and communication documented in the PACS or [employer]. Electronically Signed   By: Zetta Bills M.D.   On: 08/02/2021 13:02   CT Abdomen Pelvis Wo Contrast  Result Date: 08/02/2021 CLINICAL DATA:  35-year-old male presents for follow-up of non-small cell lung cancer with metastatic disease. * Tracking Code: BO * EXAM: CT CHEST, ABDOMEN AND PELVIS WITHOUT CONTRAST TECHNIQUE: Multidetector CT imaging of the chest, abdomen and pelvis was performed following the standard protocol without IV contrast. RADIATION DOSE REDUCTION: This exam was performed according to the departmental dose-optimization program which includes automated exposure control, adjustment of the mA and/or kV according to patient size and/or use of iterative reconstruction technique. COMPARISON:  Jun 15, 2021. FINDINGS: CT CHEST FINDINGS Cardiovascular: Calcified plaque in the thoracic aorta. No aneurysmal dilation. Heart size mild to moderately enlarged. No pericardial effusion or nodularity. Signs of coronary artery disease of RIGHT and LEFT coronary circulation. Central pulmonary vasculature is of normal caliber. Limited assessment of cardiovascular structures given lack of intravenous contrast. Mediastinum/Nodes: No axillary,  supraclavicular or gross hilar adenopathy. No mediastinal adenopathy with small lymph nodes showing increased density in the chest particularly in the AP window largest measuring 13 mm, with measured in a similar fashion is unchanged compared to previous imaging. Lungs/Pleura: Small bilateral pleural effusions are similar to previous imaging. Extrapleural lesion along the RIGHT posterior chest abutting medial RIGHT-sided ribs measuring 3.4 x 2.0 cm previously 2.8 x 1.6 cm. This shows low-density and is not associated with rib destruction despite enlargement since previous imaging Pulmonary nodule in the RIGHT lower lobe (image 76/505) 7 x 7 mm as compared to 12 x 7 mm. Diffuse interstitial thickening throughout the lower and mid chest is stable. Pleural thickening near the apex (image 62/504) 7 mm greatest thickness as compared to 8 mm greatest thickness. Discrete pleural lesion near the RIGHT lung apex measured on the previous exam adjacent to the RIGHT third and fourth rib medially is no longer evident though pleural thickening above this level persists. No new areas of pleural thickening. Musculoskeletal: See below for full musculoskeletal details. CT ABDOMEN PELVIS FINDINGS Hepatobiliary: Smooth hepatic contours. No visible hepatic lesion on noncontrast imaging. Cholelithiasis. Pancreas: Pancreatic calcification likely intraductal calcification associated with peripheral ductal dilation and atrophy without change. This calcification measuring up to 15 mm. Spleen: Normal. Adrenals/Urinary Tract: Adrenal glands are normal. Nephrolithiasis on the RIGHT is similar to prior imaging. The renal cysts of the bilateral kidneys better characterized on prior imaging not grossly changed and not requiring dedicated follow-up imaging. Urinary bladder is collapsed. No hydronephrosis. Stomach/Bowel: Stool throughout much of the colon. No signs of bowel obstruction or acute bowel process. Very large periampullary duodenal  diverticulum is unchanged and without signs of inflammation. Appendix is normal. Small bowel loops are contained within a RIGHT inguinal hernia as on previous imaging. There is some suggestion of slight increased mesenteric edema since the previous study but there is mild generalized mesenteric edema, significance uncertain and the area is incompletely imaged. Vascular/Lymphatic: IVC filter in situ. Aortic atherosclerosis without aneurysm. Reproductive: Unremarkable by CT. Other: No ascites. Musculoskeletal: Similar appearance of widespread bony metastatic disease with numerous sclerotic foci throughout the spine, pelvis and ribs. No frankly destructive bone finding or acute bone process. IMPRESSION: 1. Similar appearance of widespread bony metastatic disease. 2. Extrapleural and pleural disease in the RIGHT chest with increase in the dominant area along the RIGHT paraspinous region. There is a potential that this could represent a necrotic metastasis or loculated pleural fluid but it has enlarged. Could consider PET evaluation for further assessment as warranted. 3. RIGHT lower lobe pulmonary nodule has diminished in size still with diffuse interstitial thickening which is worse than in early May and similar to imaging from later in May of 2023. Lymphangitic tumor could have this appearance as could pulmonary edema. 4. Widespread bony metastatic disease with similar appearance to prior imaging. No destructive bone process or acute bone finding. 5. No signs of nodal disease or solid organ disease at this time. 6. Mild to moderate increased edema associated with mesentery of the small bowel and of small-bowel loops within a RIGHT inguinal hernia. Correlate with worsening symptoms in this area. There is no  sign of obstruction currently. This is a large RIGHT inguinal hernia and therefore may be at risk for obstruction. 7. Cholelithiasis without evidence of acute cholecystitis. 8. Intraductal pancreatic calcification  associated with ductal dilation and peripheral atrophy similar to prior imaging. Aortic Atherosclerosis (ICD10-I70.0). These results will be called to the ordering clinician or representative by the Radiologist Assistant, and communication documented in the PACS or [employer]. Electronically Signed   By: Zetta Bills M.D.   On: 08/02/2021 13:02     ASSESSMENT/PLAN:  This is a very pleasant 81 year old [place] male diagnosed with stage IV (T1b, N3, M1C) non-small cell lung cancer, adenocarcinoma.  The patient has a positive ALK gene translocation.  He was diagnosed in February 2023.  The patient is currently undergoing treatment with Alecensa (alectinib) 600 mg p.o. twice daily.  He tolerates this well without any concerning complaints.  He is status post 4 months of treatment.  His treatment was interrupted for 2 weeks secondary to venous thrombosis and hospitalization.   He was also recently admitted to the hospital for bacteremia. He completed antibiotics.   Of note for the history of DVT, the patient has an IVC filter placed and he is currently on Plavix for his history of CVA.  Dr. Julien Nordmann mentioned in his last note that it may be a good idea to start him on Eliquis 2.5 mg p.o. twice daily for anticoagulation once his hematoma is better. However, if he is to have his IVC filter removed, Dr. Julien Nordmann discussed he may need to be on eliquis 5 mg BID with close monitoring of bleeding. They have an upcoming appointment with their PCP. Dr. Julien Nordmann encouraged the patient discuss his foot drop and DVT/CVA and anticoagulation with him and consider referral to neurology if needed.   The patient recently had a restaging PET scan performed.  Dr. Julien Nordmann personally and independently reviewed the scan and discussed the results with the patient today.  The scan showed an excellent response to Alecensa.  There is no evidence of residual or recurrent metastatic disease.  Dr. Julien Nordmann recommends he continue on  his current treatment with Alecensa as prescribed.  We will see him back for follow-up visit in 1 month for evaluation and repeat blood work.  We will arrange for the patient to have a lab appointment performed today before leaving the clinic.  We will call him if any concerning lab findings.   The patient was advised to call immediately if she has any concerning symptoms in the interval. The patient voices understanding of current disease status and treatment options and is in agreement with the current care plan. All questions were answered. The patient knows to call the clinic with any problems, questions or concerns. We can certainly see the patient much sooner if necessary  Orders Placed This Encounter  Procedures   CBC with Differential ([facility] Only)    Standing Status:   Future    Standing Expiration Date:   08/21/2022   CMP ([facility] only)    Standing Status:   Future    Standing Expiration Date:   08/21/2022   CBC with Differential ([facility] Only)    Standing Status:   Future    Standing Expiration Date:   08/21/2022   CMP ([facility] only)    Standing Status:   Future    Standing Expiration Date:   08/21/2022   Sample to Blood Bank    Standing Status:   Future    Standing Expiration  Date:   08/21/2022   Sample to Blood Bank    Standing Status:   Future    Standing Expiration Date:   08/21/2022      Tobe Sos Heilingoetter, PA-C 08/20/21  ADDENDUM: Hematology/Oncology Attending: I had a face-to-face encounter with the patient today.  I reviewed his record, lab, scan and recommended his care plan.  This is a very pleasant 81 years old [place] male with a stage IV non-small cell lung cancer, adenocarcinoma with positive ALK gene translocation diagnosed in February 2023.  The patient is currently undergoing treatment with Alecensa (Alectinib) 600 mg p.o. twice daily since February 2023 with some interruption for around 2 weeks during the treatment because  of his pulmonary embolism and deep venous thrombosis. The patient resumed his treatment and he is feeling fine. He had repeat PET scan performed recently.  I personally and independently reviewed the PET scan images and discussed the result and showed the images to the patient and his son. His PET scan showed significant improvement of his disease. I recommended for the patient to continue his current treatment with Alecensa (Alectinib) with the same dose. Regarding the history of pulmonary embolism and deep venous thrombosis.  The patient has a filter placed and he is currently on treatment with Plavix.  The interventional radiologist are planning to remove the filter and the patient is not currently on any anticoagulation. I recommended for him to start Eliquis 5 mg p.o. twice daily as a therapeutic option if the filter has to be removed.  He may need to discontinue his Plavix. He has a history of bleeding in the past with hematoma and this need to be monitored closely after he started treatment on anticoagulation. We will see the patient back for follow-up visit in 1 months for evaluation and repeat blood work. He was advised to call immediately if he has any other concerning symptoms in the interval. The total time spent in the appointment was 30 minutes. Disclaimer: This note was dictated with voice recognition software. Similar sounding words can inadvertently be transcribed and may be missed upon review. Eilleen Kempf, MD

## 2021-08-16 ENCOUNTER — Other Ambulatory Visit (HOSPITAL_COMMUNITY): Payer: Self-pay

## 2021-08-16 ENCOUNTER — Telehealth: Payer: Self-pay

## 2021-08-16 ENCOUNTER — Other Ambulatory Visit: Payer: Self-pay | Admitting: Internal Medicine

## 2021-08-16 ENCOUNTER — Other Ambulatory Visit: Payer: Self-pay | Admitting: Family Medicine

## 2021-08-16 DIAGNOSIS — I69354 Hemiplegia and hemiparesis following cerebral infarction affecting left non-dominant side: Secondary | ICD-10-CM | POA: Diagnosis not present

## 2021-08-16 DIAGNOSIS — M1990 Unspecified osteoarthritis, unspecified site: Secondary | ICD-10-CM | POA: Diagnosis not present

## 2021-08-16 DIAGNOSIS — E1142 Type 2 diabetes mellitus with diabetic polyneuropathy: Secondary | ICD-10-CM | POA: Diagnosis not present

## 2021-08-16 DIAGNOSIS — Z9181 History of falling: Secondary | ICD-10-CM | POA: Diagnosis not present

## 2021-08-16 DIAGNOSIS — D630 Anemia in neoplastic disease: Secondary | ICD-10-CM | POA: Diagnosis not present

## 2021-08-16 DIAGNOSIS — Z86718 Personal history of other venous thrombosis and embolism: Secondary | ICD-10-CM | POA: Diagnosis not present

## 2021-08-16 DIAGNOSIS — Z7902 Long term (current) use of antithrombotics/antiplatelets: Secondary | ICD-10-CM | POA: Diagnosis not present

## 2021-08-16 DIAGNOSIS — I252 Old myocardial infarction: Secondary | ICD-10-CM | POA: Diagnosis not present

## 2021-08-16 DIAGNOSIS — I2510 Atherosclerotic heart disease of native coronary artery without angina pectoris: Secondary | ICD-10-CM | POA: Diagnosis not present

## 2021-08-16 DIAGNOSIS — M21372 Foot drop, left foot: Secondary | ICD-10-CM | POA: Diagnosis not present

## 2021-08-16 DIAGNOSIS — M109 Gout, unspecified: Secondary | ICD-10-CM | POA: Diagnosis not present

## 2021-08-16 DIAGNOSIS — I10 Essential (primary) hypertension: Secondary | ICD-10-CM | POA: Diagnosis not present

## 2021-08-16 DIAGNOSIS — I69990 Apraxia following unspecified cerebrovascular disease: Secondary | ICD-10-CM | POA: Diagnosis not present

## 2021-08-16 DIAGNOSIS — C3491 Malignant neoplasm of unspecified part of right bronchus or lung: Secondary | ICD-10-CM | POA: Diagnosis not present

## 2021-08-16 MED ORDER — ALECENSA 150 MG PO CAPS
600.0000 mg | ORAL_CAPSULE | Freq: Two times a day (BID) | ORAL | 3 refills | Status: DC
  Filled 2021-08-16: qty 240, 30d supply, fill #0
  Filled 2021-09-12: qty 240, 30d supply, fill #1
  Filled 2021-10-08: qty 240, 30d supply, fill #2
  Filled 2021-11-06: qty 240, 30d supply, fill #3

## 2021-08-16 NOTE — Telephone Encounter (Signed)
Arthur [name] daughter in law called again when he was working with OT his pulse ox dropped to 91% when active then increased when resting.  He has no symptoms - No shortness of breath or chest pain or lightheadness  Suggested as long as he has no complaints and his pulse ox is > 88 and his blood pressure is stable we can see him Tuesday  If any symptoms or worsening vs he should be seen or go to the ER if severe  She agrees

## 2021-08-16 NOTE — Telephone Encounter (Signed)
Received phone call from [place], [place] with [facility] regarding patient.   Reports that patient has had low diastolic BP at last two visits. Reports diastolic readings between 44-48.  Patient denies lightheadedness, dizziness or chest pain. Patient reported that he did not stay very hydrated yesterday. OT assistant encouraged increased fluid intake.   Informed Dr. Erin Hearing directly. Advised that patient continue to drink plenty of fluids and have close follow up. Patient is already scheduled for appointment on 7/25.  Called and spoke with patient's daughter in law. She said that patient is feeling fine and they have no current concerns. Advised of recommendations per Dr. Erin Hearing.   Provided with ED precautions.   Talbot Grumbling, RN

## 2021-08-17 ENCOUNTER — Other Ambulatory Visit (HOSPITAL_COMMUNITY): Payer: Self-pay

## 2021-08-20 ENCOUNTER — Other Ambulatory Visit: Payer: Self-pay

## 2021-08-20 ENCOUNTER — Inpatient Hospital Stay: Payer: Medicare Other

## 2021-08-20 ENCOUNTER — Inpatient Hospital Stay (HOSPITAL_BASED_OUTPATIENT_CLINIC_OR_DEPARTMENT_OTHER): Payer: Medicare Other | Admitting: Physician Assistant

## 2021-08-20 ENCOUNTER — Other Ambulatory Visit (HOSPITAL_COMMUNITY): Payer: Self-pay

## 2021-08-20 VITALS — BP 136/49 | HR 73 | Temp 97.9°F | Resp 18 | Wt 164.2 lb

## 2021-08-20 DIAGNOSIS — C3491 Malignant neoplasm of unspecified part of right bronchus or lung: Secondary | ICD-10-CM

## 2021-08-20 DIAGNOSIS — Z95828 Presence of other vascular implants and grafts: Secondary | ICD-10-CM | POA: Diagnosis not present

## 2021-08-20 DIAGNOSIS — D649 Anemia, unspecified: Secondary | ICD-10-CM

## 2021-08-20 DIAGNOSIS — Z79899 Other long term (current) drug therapy: Secondary | ICD-10-CM | POA: Diagnosis not present

## 2021-08-20 DIAGNOSIS — Z86718 Personal history of other venous thrombosis and embolism: Secondary | ICD-10-CM | POA: Diagnosis not present

## 2021-08-20 DIAGNOSIS — C7951 Secondary malignant neoplasm of bone: Secondary | ICD-10-CM | POA: Diagnosis not present

## 2021-08-20 DIAGNOSIS — Z86711 Personal history of pulmonary embolism: Secondary | ICD-10-CM | POA: Diagnosis not present

## 2021-08-20 DIAGNOSIS — C3431 Malignant neoplasm of lower lobe, right bronchus or lung: Secondary | ICD-10-CM | POA: Diagnosis not present

## 2021-08-20 DIAGNOSIS — Z8673 Personal history of transient ischemic attack (TIA), and cerebral infarction without residual deficits: Secondary | ICD-10-CM | POA: Diagnosis not present

## 2021-08-20 LAB — CBC WITH DIFFERENTIAL (CANCER CENTER ONLY)
Abs Immature Granulocytes: 0.04 10*3/uL (ref 0.00–0.07)
Basophils Absolute: 0.1 10*3/uL (ref 0.0–0.1)
Basophils Relative: 1 %
Eosinophils Absolute: 0.4 10*3/uL (ref 0.0–0.5)
Eosinophils Relative: 4 %
HCT: 24.5 % — ABNORMAL LOW (ref 39.0–52.0)
Hemoglobin: 8.1 g/dL — ABNORMAL LOW (ref 13.0–17.0)
Immature Granulocytes: 1 %
Lymphocytes Relative: 21 %
Lymphs Abs: 1.7 10*3/uL (ref 0.7–4.0)
MCH: 28.8 pg (ref 26.0–34.0)
MCHC: 33.1 g/dL (ref 30.0–36.0)
MCV: 87.2 fL (ref 80.0–100.0)
Monocytes Absolute: 0.7 10*3/uL (ref 0.1–1.0)
Monocytes Relative: 9 %
Neutro Abs: 5.1 10*3/uL (ref 1.7–7.7)
Neutrophils Relative %: 64 %
Platelet Count: 372 10*3/uL (ref 150–400)
RBC: 2.81 MIL/uL — ABNORMAL LOW (ref 4.22–5.81)
RDW: 16.1 % — ABNORMAL HIGH (ref 11.5–15.5)
WBC Count: 8 10*3/uL (ref 4.0–10.5)
nRBC: 0 % (ref 0.0–0.2)

## 2021-08-20 LAB — CMP (CANCER CENTER ONLY)
ALT: 7 U/L (ref 0–44)
AST: 19 U/L (ref 15–41)
Albumin: 3.3 g/dL — ABNORMAL LOW (ref 3.5–5.0)
Alkaline Phosphatase: 99 U/L (ref 38–126)
Anion gap: 4 — ABNORMAL LOW (ref 5–15)
BUN: 21 mg/dL (ref 8–23)
CO2: 28 mmol/L (ref 22–32)
Calcium: 8.7 mg/dL — ABNORMAL LOW (ref 8.9–10.3)
Chloride: 106 mmol/L (ref 98–111)
Creatinine: 1.4 mg/dL — ABNORMAL HIGH (ref 0.61–1.24)
GFR, Estimated: 51 mL/min — ABNORMAL LOW (ref >60)
Glucose, Bld: 138 mg/dL — ABNORMAL HIGH (ref 70–99)
Potassium: 4.5 mmol/L (ref 3.5–5.1)
Sodium: 138 mmol/L (ref 135–145)
Total Bilirubin: 1.5 mg/dL — ABNORMAL HIGH (ref 0.3–1.2)
Total Protein: 6.7 g/dL (ref 6.5–8.1)

## 2021-08-20 LAB — SAMPLE TO BLOOD BANK

## 2021-08-20 NOTE — Progress Notes (Unsigned)
    SUBJECTIVE:   CHIEF COMPLAINT / HPI:   Hernia Sometimes causes him pain and swells but improves when he lies down.  No consistent pain or any nausea and vomiting   Hypertension His blood pressures at home reportedly below 140/90 off all antihtns   Anemia No bleeding, taking iron daily.  Mild fatigue and lightheadness with Physical Therapy  Foot Drop No improvement since occurred in the hospital   PERTINENT  PMH / [place]:  Lab Results  Component Value Date   WBC 8.0 08/20/2021   HGB 8.1 (L) 08/20/2021   HCT 24.5 (L) 08/20/2021   MCV 87.2 08/20/2021   PLT 372 08/20/2021     OBJECTIVE:   BP (!) 137/55   Pulse 68   Wt 164 lb (74.4 kg)   SpO2 100%   BMI 22.87 kg/m   ***  ASSESSMENT/PLAN:   Foot drop, left foot Persistent with no improvement.  Initially thought to be compressive from hematoma but is not progressing Will refer to neurology for further evaluation.  Anemia Hgb essentially unchanged.  Continue iron.  Will touch base with heme onco to see if any further work up indicated.    Groin swelling Consistent with hernia.  Would not pursue surgery until more stable from venous thrombosis and anemia   Deep venous embolism and thrombosis (HCC) Seems to be some confusion around need for VC filter removal.  They are scheduled to meet with IR in [place] who apparently placed it.  Would like to resume DOAC but persistent anemia puts him at higher risk for complications.  Will discuss with oncology      Lind Covert, MD [place]

## 2021-08-21 ENCOUNTER — Ambulatory Visit (INDEPENDENT_AMBULATORY_CARE_PROVIDER_SITE_OTHER): Payer: Medicare Other | Admitting: Family Medicine

## 2021-08-21 ENCOUNTER — Other Ambulatory Visit: Payer: Self-pay

## 2021-08-21 ENCOUNTER — Other Ambulatory Visit: Payer: Self-pay | Admitting: Physician Assistant

## 2021-08-21 ENCOUNTER — Encounter: Payer: Self-pay | Admitting: Family Medicine

## 2021-08-21 VITALS — BP 137/55 | HR 68 | Wt 164.0 lb

## 2021-08-21 DIAGNOSIS — Z7902 Long term (current) use of antithrombotics/antiplatelets: Secondary | ICD-10-CM | POA: Diagnosis not present

## 2021-08-21 DIAGNOSIS — R1909 Other intra-abdominal and pelvic swelling, mass and lump: Secondary | ICD-10-CM | POA: Diagnosis not present

## 2021-08-21 DIAGNOSIS — I82409 Acute embolism and thrombosis of unspecified deep veins of unspecified lower extremity: Secondary | ICD-10-CM

## 2021-08-21 DIAGNOSIS — M21372 Foot drop, left foot: Secondary | ICD-10-CM | POA: Diagnosis not present

## 2021-08-21 DIAGNOSIS — I69354 Hemiplegia and hemiparesis following cerebral infarction affecting left non-dominant side: Secondary | ICD-10-CM | POA: Diagnosis not present

## 2021-08-21 DIAGNOSIS — C3491 Malignant neoplasm of unspecified part of right bronchus or lung: Secondary | ICD-10-CM | POA: Diagnosis not present

## 2021-08-21 DIAGNOSIS — D630 Anemia in neoplastic disease: Secondary | ICD-10-CM | POA: Diagnosis not present

## 2021-08-21 DIAGNOSIS — I10 Essential (primary) hypertension: Secondary | ICD-10-CM | POA: Diagnosis not present

## 2021-08-21 DIAGNOSIS — E1142 Type 2 diabetes mellitus with diabetic polyneuropathy: Secondary | ICD-10-CM | POA: Diagnosis not present

## 2021-08-21 DIAGNOSIS — I69990 Apraxia following unspecified cerebrovascular disease: Secondary | ICD-10-CM | POA: Diagnosis not present

## 2021-08-21 DIAGNOSIS — Z86718 Personal history of other venous thrombosis and embolism: Secondary | ICD-10-CM | POA: Diagnosis not present

## 2021-08-21 DIAGNOSIS — E1169 Type 2 diabetes mellitus with other specified complication: Secondary | ICD-10-CM | POA: Diagnosis not present

## 2021-08-21 DIAGNOSIS — I2510 Atherosclerotic heart disease of native coronary artery without angina pectoris: Secondary | ICD-10-CM | POA: Diagnosis not present

## 2021-08-21 DIAGNOSIS — D649 Anemia, unspecified: Secondary | ICD-10-CM

## 2021-08-21 DIAGNOSIS — Z9181 History of falling: Secondary | ICD-10-CM | POA: Diagnosis not present

## 2021-08-21 DIAGNOSIS — M109 Gout, unspecified: Secondary | ICD-10-CM | POA: Diagnosis not present

## 2021-08-21 DIAGNOSIS — M1990 Unspecified osteoarthritis, unspecified site: Secondary | ICD-10-CM | POA: Diagnosis not present

## 2021-08-21 DIAGNOSIS — I252 Old myocardial infarction: Secondary | ICD-10-CM | POA: Diagnosis not present

## 2021-08-21 LAB — POCT GLYCOSYLATED HEMOGLOBIN (HGB A1C): HbA1c, POC (controlled diabetic range): 5.2 % (ref 0.0–7.0)

## 2021-08-21 NOTE — Assessment & Plan Note (Signed)
Seems to be some confusion around need for VC filter removal.  They are scheduled to meet with IR in [place] who apparently placed it.  Would like to resume DOAC but persistent anemia puts him at higher risk for complications.  Will discuss with oncology

## 2021-08-21 NOTE — Assessment & Plan Note (Signed)
Consistent with reducible hernia.  Would not pursue surgery until more stable from venous thrombosis and anemia

## 2021-08-21 NOTE — Assessment & Plan Note (Signed)
Persistent with no improvement.  Initially thought to be compressive from hematoma but is not progressing Will refer to neurology for further evaluation.

## 2021-08-21 NOTE — Patient Instructions (Signed)
Good to see you today - Thank you for coming in  Things we discussed today:  Stop the Ramelteon  For the Foot Drop - I will refer you to Neurology they should call you with an appointment - I will order a CT scan for the left thigh - they will call you  For the Anemia and starting back the Eliquis  I will talk to Dr Julien Nordmann   For the hernia - if sever pain or vomiting call and go to the ER   Please always bring your medication bottles  Come back to see me in 2 months

## 2021-08-21 NOTE — Assessment & Plan Note (Addendum)
Hgb essentially unchanged.  Continue iron and PPI. Is also on plavix for recent MI.   Will touch base with heme onco to see if any further work up indicated.

## 2021-08-22 ENCOUNTER — Other Ambulatory Visit: Payer: Self-pay | Admitting: Physician Assistant

## 2021-08-22 ENCOUNTER — Other Ambulatory Visit: Payer: Self-pay

## 2021-08-22 ENCOUNTER — Inpatient Hospital Stay: Payer: Medicare Other

## 2021-08-22 ENCOUNTER — Telehealth: Payer: Self-pay

## 2021-08-22 DIAGNOSIS — Z86711 Personal history of pulmonary embolism: Secondary | ICD-10-CM | POA: Diagnosis not present

## 2021-08-22 DIAGNOSIS — Z86718 Personal history of other venous thrombosis and embolism: Secondary | ICD-10-CM | POA: Diagnosis not present

## 2021-08-22 DIAGNOSIS — D649 Anemia, unspecified: Secondary | ICD-10-CM

## 2021-08-22 DIAGNOSIS — Z95828 Presence of other vascular implants and grafts: Secondary | ICD-10-CM | POA: Diagnosis not present

## 2021-08-22 DIAGNOSIS — C3431 Malignant neoplasm of lower lobe, right bronchus or lung: Secondary | ICD-10-CM | POA: Diagnosis not present

## 2021-08-22 DIAGNOSIS — Z79899 Other long term (current) drug therapy: Secondary | ICD-10-CM | POA: Diagnosis not present

## 2021-08-22 DIAGNOSIS — Z8673 Personal history of transient ischemic attack (TIA), and cerebral infarction without residual deficits: Secondary | ICD-10-CM | POA: Diagnosis not present

## 2021-08-22 DIAGNOSIS — C7951 Secondary malignant neoplasm of bone: Secondary | ICD-10-CM | POA: Diagnosis not present

## 2021-08-22 LAB — IRON AND IRON BINDING CAPACITY (CC-WL,HP ONLY)
Iron: 78 ug/dL (ref 45–182)
Saturation Ratios: 33 % (ref 17.9–39.5)
TIBC: 239 ug/dL — ABNORMAL LOW (ref 250–450)
UIBC: 161 ug/dL (ref 117–376)

## 2021-08-22 LAB — SAMPLE TO BLOOD BANK

## 2021-08-22 LAB — CBC WITH DIFFERENTIAL (CANCER CENTER ONLY)
Abs Immature Granulocytes: 0.04 10*3/uL (ref 0.00–0.07)
Basophils Absolute: 0.1 10*3/uL (ref 0.0–0.1)
Basophils Relative: 1 %
Eosinophils Absolute: 0.7 10*3/uL — ABNORMAL HIGH (ref 0.0–0.5)
Eosinophils Relative: 10 %
HCT: 23.1 % — ABNORMAL LOW (ref 39.0–52.0)
Hemoglobin: 7.8 g/dL — ABNORMAL LOW (ref 13.0–17.0)
Immature Granulocytes: 1 %
Lymphocytes Relative: 20 %
Lymphs Abs: 1.4 10*3/uL (ref 0.7–4.0)
MCH: 29.1 pg (ref 26.0–34.0)
MCHC: 33.8 g/dL (ref 30.0–36.0)
MCV: 86.2 fL (ref 80.0–100.0)
Monocytes Absolute: 0.6 10*3/uL (ref 0.1–1.0)
Monocytes Relative: 9 %
Neutro Abs: 4.2 10*3/uL (ref 1.7–7.7)
Neutrophils Relative %: 59 %
Platelet Count: 358 10*3/uL (ref 150–400)
RBC: 2.68 MIL/uL — ABNORMAL LOW (ref 4.22–5.81)
RDW: 15.9 % — ABNORMAL HIGH (ref 11.5–15.5)
WBC Count: 7.1 10*3/uL (ref 4.0–10.5)
nRBC: 0 % (ref 0.0–0.2)

## 2021-08-22 LAB — ABO/RH: ABO/RH(D): B NEG

## 2021-08-22 LAB — FERRITIN: Ferritin: 291 ng/mL (ref 24–336)

## 2021-08-22 LAB — DIRECT ANTIGLOBULIN TEST (NOT AT ARMC)
DAT, IgG: NEGATIVE
DAT, complement: NEGATIVE

## 2021-08-22 LAB — LACTATE DEHYDROGENASE: LDH: 276 U/L — ABNORMAL HIGH (ref 98–192)

## 2021-08-22 LAB — PREPARE RBC (CROSSMATCH)

## 2021-08-22 NOTE — Telephone Encounter (Signed)
-----   Message from [employer], PA-C sent at 08/21/2021  8:15 AM EDT ----- Regarding: FW: His anemia Can you call the son and arrange for labs and possible 2 units of blood appointment this week? I am going to add extra orders too to repeat CBC, DAT, haptoglobin, LDH, sample to blood bank, iron and ferritin. ----- Message ----- From: Curt Bears, MD Sent: 08/20/2021   4:33 PM EDT To: Lind Covert, MD; # Subject: RE: His anemia                                 Hi Truman Hayward He had a evaluation in May when he was in the hospital and there was some evidence of hemolysis with haptoglobin less than 10 but he also has some evidence of iron deficiency. We will be happy to arrange for him to receive transfusion but I think testing his stool for Hemoccult may be also a good idea.  He had a big hematoma at that time and that may have explain his low haptoglobin.  I could also consider him for iron infusion if needed. Thank you Mohamed ----- Message ----- From: Lind Covert, MD Sent: 08/20/2021   3:59 PM EDT To: Curt Bears, MD Subject: His anemia                                     [facility] all is well  His hemoglobin continues to hover around 8.  Despite being off a DOAC.  Do you think this needs further evaluation?   Worry doesn't give him much reserve if he needs to go back on  Thanks  Lee  ----- Message ----- From: Curt Bears, MD Sent: 08/20/2021   1:47 PM EDT To: Lind Covert, MD

## 2021-08-22 NOTE — Telephone Encounter (Signed)
I spoke with pts son, Vipul, and advised as indicated. Pt has been arranged for 2 units of PRBCs on Saturday 08/25/21. I have also reminded pts son that his father is not to remove the blue BB arm band and it must be on his wrist upon arrival. Pts son, Vipul expressed understanding of this information.

## 2021-08-23 DIAGNOSIS — I69990 Apraxia following unspecified cerebrovascular disease: Secondary | ICD-10-CM | POA: Diagnosis not present

## 2021-08-23 DIAGNOSIS — M109 Gout, unspecified: Secondary | ICD-10-CM | POA: Diagnosis not present

## 2021-08-23 DIAGNOSIS — I10 Essential (primary) hypertension: Secondary | ICD-10-CM | POA: Diagnosis not present

## 2021-08-23 DIAGNOSIS — Z9181 History of falling: Secondary | ICD-10-CM | POA: Diagnosis not present

## 2021-08-23 DIAGNOSIS — E1142 Type 2 diabetes mellitus with diabetic polyneuropathy: Secondary | ICD-10-CM | POA: Diagnosis not present

## 2021-08-23 DIAGNOSIS — M21372 Foot drop, left foot: Secondary | ICD-10-CM | POA: Diagnosis not present

## 2021-08-23 DIAGNOSIS — D630 Anemia in neoplastic disease: Secondary | ICD-10-CM | POA: Diagnosis not present

## 2021-08-23 DIAGNOSIS — I252 Old myocardial infarction: Secondary | ICD-10-CM | POA: Diagnosis not present

## 2021-08-23 DIAGNOSIS — Z7902 Long term (current) use of antithrombotics/antiplatelets: Secondary | ICD-10-CM | POA: Diagnosis not present

## 2021-08-23 DIAGNOSIS — Z86718 Personal history of other venous thrombosis and embolism: Secondary | ICD-10-CM | POA: Diagnosis not present

## 2021-08-23 DIAGNOSIS — C3491 Malignant neoplasm of unspecified part of right bronchus or lung: Secondary | ICD-10-CM | POA: Diagnosis not present

## 2021-08-23 DIAGNOSIS — M1990 Unspecified osteoarthritis, unspecified site: Secondary | ICD-10-CM | POA: Diagnosis not present

## 2021-08-23 DIAGNOSIS — I69354 Hemiplegia and hemiparesis following cerebral infarction affecting left non-dominant side: Secondary | ICD-10-CM | POA: Diagnosis not present

## 2021-08-23 DIAGNOSIS — I2510 Atherosclerotic heart disease of native coronary artery without angina pectoris: Secondary | ICD-10-CM | POA: Diagnosis not present

## 2021-08-25 ENCOUNTER — Other Ambulatory Visit: Payer: Self-pay

## 2021-08-25 ENCOUNTER — Inpatient Hospital Stay: Payer: Medicare Other

## 2021-08-25 DIAGNOSIS — Z8673 Personal history of transient ischemic attack (TIA), and cerebral infarction without residual deficits: Secondary | ICD-10-CM | POA: Diagnosis not present

## 2021-08-25 DIAGNOSIS — Z95828 Presence of other vascular implants and grafts: Secondary | ICD-10-CM | POA: Diagnosis not present

## 2021-08-25 DIAGNOSIS — D649 Anemia, unspecified: Secondary | ICD-10-CM

## 2021-08-25 DIAGNOSIS — C3431 Malignant neoplasm of lower lobe, right bronchus or lung: Secondary | ICD-10-CM | POA: Diagnosis not present

## 2021-08-25 DIAGNOSIS — C7951 Secondary malignant neoplasm of bone: Secondary | ICD-10-CM | POA: Diagnosis not present

## 2021-08-25 DIAGNOSIS — Z86711 Personal history of pulmonary embolism: Secondary | ICD-10-CM | POA: Diagnosis not present

## 2021-08-25 DIAGNOSIS — Z86718 Personal history of other venous thrombosis and embolism: Secondary | ICD-10-CM | POA: Diagnosis not present

## 2021-08-25 DIAGNOSIS — Z79899 Other long term (current) drug therapy: Secondary | ICD-10-CM | POA: Diagnosis not present

## 2021-08-25 LAB — HAPTOGLOBIN: Haptoglobin: <10 mg/dL — ABNORMAL LOW (ref 34–355)

## 2021-08-25 MED ORDER — DIPHENHYDRAMINE HCL 25 MG PO CAPS
25.0000 mg | ORAL_CAPSULE | Freq: Once | ORAL | Status: AC
  Administered 2021-08-25: 25 mg via ORAL
  Filled 2021-08-25: qty 1

## 2021-08-25 MED ORDER — ACETAMINOPHEN 325 MG PO TABS
650.0000 mg | ORAL_TABLET | Freq: Once | ORAL | Status: AC
  Administered 2021-08-25: 650 mg via ORAL
  Filled 2021-08-25: qty 2

## 2021-08-25 MED ORDER — SODIUM CHLORIDE 0.9% FLUSH: 3.0000 mL | INTRAVENOUS | Status: DC | PRN

## 2021-08-25 MED ORDER — SODIUM CHLORIDE 0.9% IV SOLUTION
250.0000 mL | Freq: Once | INTRAVENOUS | Status: AC
  Administered 2021-08-25: 250 mL via INTRAVENOUS

## 2021-08-25 NOTE — Patient Instructions (Signed)
Blood Transfusion, Adult, Care After This sheet gives you information about how to care for yourself after your procedure. Your doctor may also give you more specific instructions. If you have problems or questions, contact your doctor. What can I expect after the procedure? After the procedure, it is common to have: Bruising and soreness at the IV site. A headache. Follow these instructions at home: Insertion site care     Follow instructions from your doctor about how to take care of your insertion site. This is where an IV tube was put into your vein. Make sure you: Wash your hands with soap and water before and after you change your bandage (dressing). If you cannot use soap and water, use hand sanitizer. Change your bandage as told by your doctor. Check your insertion site every day for signs of infection. Check for: Redness, swelling, or pain. Bleeding from the site. Warmth. Pus or a bad smell. General instructions Take over-the-counter and prescription medicines only as told by your doctor. Rest as told by your doctor. Go back to your normal activities as told by your doctor. Keep all follow-up visits as told by your doctor. This is important. Contact a doctor if: You have itching or red, swollen areas of skin (hives). You feel worried or nervous (anxious). You feel weak after doing your normal activities. You have redness, swelling, warmth, or pain around the insertion site. You have blood coming from the insertion site, and the blood does not stop with pressure. You have pus or a bad smell coming from the insertion site. Get help right away if: You have signs of a serious reaction. This may be coming from an allergy or the body's defense system (immune system). Signs include: Trouble breathing or shortness of breath. Swelling of the face or feeling warm (flushed). Fever or chills. Head, chest, or back pain. Dark pee (urine) or blood in the pee. Widespread rash. Fast  heartbeat. Feeling dizzy or light-headed. You may receive your blood transfusion in an outpatient setting. If so, you will be told whom to contact to report any reactions. These symptoms may be an emergency. Do not wait to see if the symptoms will go away. Get medical help right away. Call your local emergency services (911 in the [place] Do not drive yourself to the hospital. Summary Bruising and soreness at the IV site are common. Check your insertion site every day for signs of infection. Rest as told by your doctor. Go back to your normal activities as told by your doctor. Get help right away if you have signs of a serious reaction. This information is not intended to replace advice given to you by your health care provider. Make sure you discuss any questions you have with your health care provider. Document Revised: 05/11/2020 Document Reviewed: 07/09/2018 Elsevier Patient Education  2023 Elsevier Inc.  

## 2021-08-27 ENCOUNTER — Telehealth: Payer: Self-pay

## 2021-08-27 ENCOUNTER — Other Ambulatory Visit: Payer: Self-pay | Admitting: Physician Assistant

## 2021-08-27 DIAGNOSIS — C3490 Malignant neoplasm of unspecified part of unspecified bronchus or lung: Secondary | ICD-10-CM | POA: Diagnosis not present

## 2021-08-27 DIAGNOSIS — D631 Anemia in chronic kidney disease: Secondary | ICD-10-CM | POA: Diagnosis not present

## 2021-08-27 DIAGNOSIS — N189 Chronic kidney disease, unspecified: Secondary | ICD-10-CM | POA: Diagnosis not present

## 2021-08-27 DIAGNOSIS — K219 Gastro-esophageal reflux disease without esophagitis: Secondary | ICD-10-CM

## 2021-08-27 DIAGNOSIS — D598 Other acquired hemolytic anemias: Secondary | ICD-10-CM

## 2021-08-27 DIAGNOSIS — N1831 Chronic kidney disease, stage 3a: Secondary | ICD-10-CM | POA: Diagnosis not present

## 2021-08-27 LAB — BPAM RBC
Blood Product Expiration Date: 202308152359
Blood Product Expiration Date: 202308192359
ISSUE DATE / TIME: 202307290856
ISSUE DATE / TIME: 202307290856
Unit Type and Rh: 1700
Unit Type and Rh: 1700

## 2021-08-27 LAB — TYPE AND SCREEN
ABO/RH(D): B NEG
Antibody Screen: NEGATIVE
Unit division: 0
Unit division: 0

## 2021-08-27 MED ORDER — PREDNISONE 10 MG PO TABS: ORAL_TABLET | ORAL | 0 refills | Status: DC

## 2021-08-27 NOTE — Telephone Encounter (Signed)
Called pt's daughter-in-law to relay the following message per [facility] NP. Reviewed side effects of steroids. Daughter-in-law voiced understanding. And will pick up patient's prescription this AM.  Can you call this patient's son today and let him know his labs looks suspicious for hemolytic anemia again. He had it in the past in the hospital. I sent a high dose prednisone taper to the pharmacy that he should start today. He is going to take 70 mg daily in the AM for 1 week, followed by 50, 40, 30, 20, 10. I am also going to send prilosec to the pharmacy. Can you just review the side effects of steroids with hyperglycemia (I don't think he has diabetes), insomnia, GI upset, swelling, increased appetite?

## 2021-08-30 ENCOUNTER — Telehealth: Payer: Self-pay | Admitting: Internal Medicine

## 2021-08-30 NOTE — Telephone Encounter (Signed)
Scheduled per 08/24 los, patient has been called and notified.

## 2021-09-04 ENCOUNTER — Other Ambulatory Visit: Payer: Self-pay

## 2021-09-04 ENCOUNTER — Other Ambulatory Visit (HOSPITAL_COMMUNITY): Payer: Self-pay

## 2021-09-05 DIAGNOSIS — Z95828 Presence of other vascular implants and grafts: Secondary | ICD-10-CM | POA: Diagnosis not present

## 2021-09-06 DIAGNOSIS — D630 Anemia in neoplastic disease: Secondary | ICD-10-CM | POA: Diagnosis not present

## 2021-09-06 DIAGNOSIS — I69990 Apraxia following unspecified cerebrovascular disease: Secondary | ICD-10-CM | POA: Diagnosis not present

## 2021-09-06 DIAGNOSIS — M21372 Foot drop, left foot: Secondary | ICD-10-CM | POA: Diagnosis not present

## 2021-09-06 DIAGNOSIS — E1142 Type 2 diabetes mellitus with diabetic polyneuropathy: Secondary | ICD-10-CM | POA: Diagnosis not present

## 2021-09-06 DIAGNOSIS — I69354 Hemiplegia and hemiparesis following cerebral infarction affecting left non-dominant side: Secondary | ICD-10-CM | POA: Diagnosis not present

## 2021-09-06 DIAGNOSIS — M1990 Unspecified osteoarthritis, unspecified site: Secondary | ICD-10-CM | POA: Diagnosis not present

## 2021-09-06 DIAGNOSIS — C3491 Malignant neoplasm of unspecified part of right bronchus or lung: Secondary | ICD-10-CM | POA: Diagnosis not present

## 2021-09-06 DIAGNOSIS — M109 Gout, unspecified: Secondary | ICD-10-CM | POA: Diagnosis not present

## 2021-09-06 DIAGNOSIS — Z9181 History of falling: Secondary | ICD-10-CM | POA: Diagnosis not present

## 2021-09-06 DIAGNOSIS — I10 Essential (primary) hypertension: Secondary | ICD-10-CM | POA: Diagnosis not present

## 2021-09-06 DIAGNOSIS — I252 Old myocardial infarction: Secondary | ICD-10-CM | POA: Diagnosis not present

## 2021-09-06 DIAGNOSIS — I2510 Atherosclerotic heart disease of native coronary artery without angina pectoris: Secondary | ICD-10-CM | POA: Diagnosis not present

## 2021-09-06 DIAGNOSIS — Z86718 Personal history of other venous thrombosis and embolism: Secondary | ICD-10-CM | POA: Diagnosis not present

## 2021-09-06 DIAGNOSIS — Z7902 Long term (current) use of antithrombotics/antiplatelets: Secondary | ICD-10-CM | POA: Diagnosis not present

## 2021-09-09 DIAGNOSIS — C3491 Malignant neoplasm of unspecified part of right bronchus or lung: Secondary | ICD-10-CM | POA: Diagnosis not present

## 2021-09-09 DIAGNOSIS — Z9181 History of falling: Secondary | ICD-10-CM | POA: Diagnosis not present

## 2021-09-09 DIAGNOSIS — M21372 Foot drop, left foot: Secondary | ICD-10-CM | POA: Diagnosis not present

## 2021-09-09 DIAGNOSIS — Z7902 Long term (current) use of antithrombotics/antiplatelets: Secondary | ICD-10-CM | POA: Diagnosis not present

## 2021-09-09 DIAGNOSIS — Z86718 Personal history of other venous thrombosis and embolism: Secondary | ICD-10-CM | POA: Diagnosis not present

## 2021-09-09 DIAGNOSIS — I69354 Hemiplegia and hemiparesis following cerebral infarction affecting left non-dominant side: Secondary | ICD-10-CM | POA: Diagnosis not present

## 2021-09-09 DIAGNOSIS — M1990 Unspecified osteoarthritis, unspecified site: Secondary | ICD-10-CM | POA: Diagnosis not present

## 2021-09-09 DIAGNOSIS — D630 Anemia in neoplastic disease: Secondary | ICD-10-CM | POA: Diagnosis not present

## 2021-09-09 DIAGNOSIS — E1142 Type 2 diabetes mellitus with diabetic polyneuropathy: Secondary | ICD-10-CM | POA: Diagnosis not present

## 2021-09-09 DIAGNOSIS — I2510 Atherosclerotic heart disease of native coronary artery without angina pectoris: Secondary | ICD-10-CM | POA: Diagnosis not present

## 2021-09-09 DIAGNOSIS — M109 Gout, unspecified: Secondary | ICD-10-CM | POA: Diagnosis not present

## 2021-09-09 DIAGNOSIS — I10 Essential (primary) hypertension: Secondary | ICD-10-CM | POA: Diagnosis not present

## 2021-09-09 DIAGNOSIS — I252 Old myocardial infarction: Secondary | ICD-10-CM | POA: Diagnosis not present

## 2021-09-09 DIAGNOSIS — I69990 Apraxia following unspecified cerebrovascular disease: Secondary | ICD-10-CM | POA: Diagnosis not present

## 2021-09-10 DIAGNOSIS — M79606 Pain in leg, unspecified: Secondary | ICD-10-CM | POA: Diagnosis not present

## 2021-09-10 DIAGNOSIS — S7010XA Contusion of unspecified thigh, initial encounter: Secondary | ICD-10-CM | POA: Diagnosis not present

## 2021-09-11 DIAGNOSIS — M21372 Foot drop, left foot: Secondary | ICD-10-CM | POA: Diagnosis not present

## 2021-09-11 DIAGNOSIS — I10 Essential (primary) hypertension: Secondary | ICD-10-CM | POA: Diagnosis not present

## 2021-09-11 DIAGNOSIS — I69354 Hemiplegia and hemiparesis following cerebral infarction affecting left non-dominant side: Secondary | ICD-10-CM | POA: Diagnosis not present

## 2021-09-11 DIAGNOSIS — C3491 Malignant neoplasm of unspecified part of right bronchus or lung: Secondary | ICD-10-CM | POA: Diagnosis not present

## 2021-09-11 DIAGNOSIS — I2510 Atherosclerotic heart disease of native coronary artery without angina pectoris: Secondary | ICD-10-CM | POA: Diagnosis not present

## 2021-09-11 DIAGNOSIS — D630 Anemia in neoplastic disease: Secondary | ICD-10-CM | POA: Diagnosis not present

## 2021-09-11 DIAGNOSIS — Z86718 Personal history of other venous thrombosis and embolism: Secondary | ICD-10-CM | POA: Diagnosis not present

## 2021-09-11 DIAGNOSIS — M109 Gout, unspecified: Secondary | ICD-10-CM | POA: Diagnosis not present

## 2021-09-11 DIAGNOSIS — Z9181 History of falling: Secondary | ICD-10-CM | POA: Diagnosis not present

## 2021-09-11 DIAGNOSIS — Z7902 Long term (current) use of antithrombotics/antiplatelets: Secondary | ICD-10-CM | POA: Diagnosis not present

## 2021-09-11 DIAGNOSIS — I252 Old myocardial infarction: Secondary | ICD-10-CM | POA: Diagnosis not present

## 2021-09-11 DIAGNOSIS — M1990 Unspecified osteoarthritis, unspecified site: Secondary | ICD-10-CM | POA: Diagnosis not present

## 2021-09-11 DIAGNOSIS — I69990 Apraxia following unspecified cerebrovascular disease: Secondary | ICD-10-CM | POA: Diagnosis not present

## 2021-09-11 DIAGNOSIS — E1142 Type 2 diabetes mellitus with diabetic polyneuropathy: Secondary | ICD-10-CM | POA: Diagnosis not present

## 2021-09-12 ENCOUNTER — Other Ambulatory Visit (HOSPITAL_COMMUNITY): Payer: Self-pay

## 2021-09-13 ENCOUNTER — Other Ambulatory Visit (HOSPITAL_COMMUNITY): Payer: Self-pay

## 2021-09-17 ENCOUNTER — Inpatient Hospital Stay: Payer: Medicare Other | Attending: Physician Assistant

## 2021-09-17 ENCOUNTER — Other Ambulatory Visit: Payer: Self-pay

## 2021-09-17 ENCOUNTER — Inpatient Hospital Stay (HOSPITAL_BASED_OUTPATIENT_CLINIC_OR_DEPARTMENT_OTHER): Payer: Medicare Other | Admitting: Internal Medicine

## 2021-09-17 VITALS — BP 143/55 | HR 70 | Temp 97.8°F | Wt 153.5 lb

## 2021-09-17 DIAGNOSIS — Z79899 Other long term (current) drug therapy: Secondary | ICD-10-CM | POA: Insufficient documentation

## 2021-09-17 DIAGNOSIS — C3491 Malignant neoplasm of unspecified part of right bronchus or lung: Secondary | ICD-10-CM | POA: Diagnosis not present

## 2021-09-17 DIAGNOSIS — C3490 Malignant neoplasm of unspecified part of unspecified bronchus or lung: Secondary | ICD-10-CM | POA: Diagnosis not present

## 2021-09-17 DIAGNOSIS — Z5111 Encounter for antineoplastic chemotherapy: Secondary | ICD-10-CM

## 2021-09-17 DIAGNOSIS — D589 Hereditary hemolytic anemia, unspecified: Secondary | ICD-10-CM | POA: Insufficient documentation

## 2021-09-17 DIAGNOSIS — Z86718 Personal history of other venous thrombosis and embolism: Secondary | ICD-10-CM | POA: Insufficient documentation

## 2021-09-17 LAB — CBC WITH DIFFERENTIAL (CANCER CENTER ONLY)
Abs Immature Granulocytes: 0.09 10*3/uL — ABNORMAL HIGH (ref 0.00–0.07)
Basophils Absolute: 0 10*3/uL (ref 0.0–0.1)
Basophils Relative: 0 %
Eosinophils Absolute: 0.3 10*3/uL (ref 0.0–0.5)
Eosinophils Relative: 2 %
HCT: 31 % — ABNORMAL LOW (ref 39.0–52.0)
Hemoglobin: 10.7 g/dL — ABNORMAL LOW (ref 13.0–17.0)
Immature Granulocytes: 1 %
Lymphocytes Relative: 17 %
Lymphs Abs: 2.2 10*3/uL (ref 0.7–4.0)
MCH: 28.5 pg (ref 26.0–34.0)
MCHC: 34.5 g/dL (ref 30.0–36.0)
MCV: 82.7 fL (ref 80.0–100.0)
Monocytes Absolute: 0.8 10*3/uL (ref 0.1–1.0)
Monocytes Relative: 6 %
Neutro Abs: 9.8 10*3/uL — ABNORMAL HIGH (ref 1.7–7.7)
Neutrophils Relative %: 74 %
Platelet Count: 314 10*3/uL (ref 150–400)
RBC: 3.75 MIL/uL — ABNORMAL LOW (ref 4.22–5.81)
RDW: 15.9 % — ABNORMAL HIGH (ref 11.5–15.5)
WBC Count: 13.1 10*3/uL — ABNORMAL HIGH (ref 4.0–10.5)
nRBC: 0 % (ref 0.0–0.2)

## 2021-09-17 LAB — CMP (CANCER CENTER ONLY)
ALT: 16 U/L (ref 0–44)
AST: 21 U/L (ref 15–41)
Albumin: 3.4 g/dL — ABNORMAL LOW (ref 3.5–5.0)
Alkaline Phosphatase: 92 U/L (ref 38–126)
Anion gap: 4 — ABNORMAL LOW (ref 5–15)
BUN: 26 mg/dL — ABNORMAL HIGH (ref 8–23)
CO2: 33 mmol/L — ABNORMAL HIGH (ref 22–32)
Calcium: 8.7 mg/dL — ABNORMAL LOW (ref 8.9–10.3)
Chloride: 101 mmol/L (ref 98–111)
Creatinine: 1.31 mg/dL — ABNORMAL HIGH (ref 0.61–1.24)
GFR, Estimated: 55 mL/min — ABNORMAL LOW (ref >60)
Glucose, Bld: 125 mg/dL — ABNORMAL HIGH (ref 70–99)
Potassium: 3.5 mmol/L (ref 3.5–5.1)
Sodium: 138 mmol/L (ref 135–145)
Total Bilirubin: 2.3 mg/dL — ABNORMAL HIGH (ref 0.3–1.2)
Total Protein: 6.2 g/dL — ABNORMAL LOW (ref 6.5–8.1)

## 2021-09-17 LAB — SAMPLE TO BLOOD BANK

## 2021-09-17 NOTE — Progress Notes (Signed)
[place] [phone]   [phone]  OFFICE PROGRESS NOTE  Lind Covert, MD [place] [address]  DIAGNOSIS: Stage IV (T1b, N3, M1 C) non-small cell lung cancer, adenocarcinoma with positive ALK gene translocation diagnosed in February 2023.  PRIOR THERAPY: None  CURRENT THERAPY: Alecensa (Alectinib) 600 mg p.o. twice daily.  First dose April 11, 2021.  Status post 5 months of treatment.  INTERVAL HISTORY: Arthur [name] 81 y.o. male returns to the clinic today for follow-up visit accompanied by his son.  The patient is feeling much better today after he started the taper dose of prednisone.  He is currently on 30 mg p.o. daily for this week.  He denied having any current chest pain, shortness of breath except with exertion with no cough or hemoptysis.  He continues to have the weakness in the lower extremities.  The swelling of the left lower extremity has significantly improved.  He has no current nausea, vomiting, diarrhea or constipation.  He has no headache or visual changes.  His last PET scan showed no concerning finding for disease progression and that showed significant improvement in his disease.  He is here today for evaluation and repeat blood work.  MEDICAL HISTORY: Past Medical History:  Diagnosis Date   Diabetes mellitus without complication ([place])    High cholesterol    Hypertension    lung ca    Normal nuclear stress test 01/29/2008   stress perfusion study apparently in 2010 in [facility] which he said was negative.    ALLERGIES:  is allergic to ace inhibitors and iohexol.  MEDICATIONS:  Current Outpatient Medications  Medication Sig Dispense Refill   acetaminophen (TYLENOL) 650 MG CR tablet Take 650 mg by mouth every 8 (eight) hours as needed for pain.     alectinib (ALECENSA) 150 MG capsule Take 4 capsules (600 mg total) by mouth 2 (two) times daily with a meal. 240 capsule 3    busPIRone (BUSPAR) 5 MG tablet Take 1 tablet (5 mg total) by mouth 2 (two) times daily. 60 tablet 1   clopidogrel (PLAVIX) 75 MG tablet Take 1 tablet (75 mg total) by mouth daily. 90 tablet 1   ferrous sulfate 324 (65 Fe) MG TBEC Take 324 mg by mouth daily. 30 tablet    omeprazole (PRILOSEC) 20 MG capsule Take 20 mg by mouth daily.     polyethylene glycol powder (GLYCOLAX/MIRALAX) 17 GM/SCOOP powder Take 17 g by mouth daily. 3350 g 1   predniSONE (DELTASONE) 10 MG tablet Take 7 tablets (70 mg) in the morning for 1 week, followed by 5 tablets (50 mg daily) in the morning for 1 week, followed by 4 tablets (40 mg) daily in the morning for 1 week, followed by 3 tablets (30 mg daily) in the morning for 1 week, followed by 2 tablets (20 mg) daily in the morning for 1 week, followed by 1 tablet (10 mg) daily for 1 week. 154 tablet 0   ramelteon (ROZEREM) 8 MG tablet TAKE 1 TABLET BY MOUTH AT BEDTIME 30 tablet 0   rosuvastatin (CRESTOR) 5 MG tablet Take 1 tablet by mouth once daily 90 tablet 0   trolamine salicylate (ASPER-FLEX) 10 % cream Apply 1 application topically as needed for muscle pain. 85 g 0   No current facility-administered medications for this visit.    SURGICAL HISTORY:  Past Surgical History:  Procedure Laterality Date   CATARACT EXTRACTION, BILATERAL  2006   CORONARY STENT INTERVENTION N/A 02/06/2021   Procedure: CORONARY STENT INTERVENTION;  Surgeon: Jettie Booze, MD;  Location: [place] [facility];  Service: Cardiovascular;  Laterality: N/A;   FINE NEEDLE ASPIRATION  03/21/2021   Procedure: FINE NEEDLE ASPIRATION;  Surgeon: Garner Nash, DO;  Location: [place];  Service: Pulmonary;;   INTRAVASCULAR ULTRASOUND/IVUS N/A 02/06/2021   Procedure: Intravascular Ultrasound/IVUS;  Surgeon: Jettie Booze, MD;  Location: [place] [facility];  Service: Cardiovascular;  Laterality: N/A;   KNEE SURGERY  2005   LEFT HEART CATH AND CORONARY ANGIOGRAPHY N/A 02/06/2021    Procedure: LEFT HEART CATH AND CORONARY ANGIOGRAPHY;  Surgeon: Jettie Booze, MD;  Location: [place] [facility];  Service: Cardiovascular;  Laterality: N/A;   VIDEO BRONCHOSCOPY WITH ENDOBRONCHIAL ULTRASOUND Bilateral 03/21/2021   Procedure: VIDEO BRONCHOSCOPY WITH ENDOBRONCHIAL ULTRASOUND;  Surgeon: Garner Nash, DO;  Location: [place];  Service: Pulmonary;  Laterality: Bilateral;    REVIEW OF SYSTEMS:  A comprehensive review of systems was negative except for: Constitutional: positive for fatigue Musculoskeletal: positive for muscle weakness   PHYSICAL EXAMINATION: General appearance: alert, cooperative, fatigued, and no distress Head: Normocephalic, without obvious abnormality, atraumatic Neck: no adenopathy, no JVD, supple, symmetrical, trachea midline, and thyroid not enlarged, symmetric, no tenderness/mass/nodules Lymph nodes: Cervical, supraclavicular, and axillary nodes normal. Resp: clear to auscultation bilaterally Back: symmetric, no curvature. ROM normal. No CVA tenderness. Cardio: regular rate and rhythm, S1, S2 normal, no murmur, click, rub or gallop GI: soft, non-tender; bowel sounds normal; no masses,  no organomegaly Extremities: edema 1+ edema in the left lower extremity  ECOG PERFORMANCE STATUS: 1 - Symptomatic but completely ambulatory  Blood pressure (!) 143/55, pulse 70, temperature 97.8 F (36.6 C), temperature source Tympanic, weight 153 lb 8 oz (69.6 kg), SpO2 100 %.  LABORATORY DATA: Lab Results  Component Value Date   WBC 13.1 (H) 09/17/2021   HGB 10.7 (L) 09/17/2021   HCT 31.0 (L) 09/17/2021   MCV 82.7 09/17/2021   PLT 314 09/17/2021      Chemistry      Component Value Date/Time   NA 138 09/17/2021 1055   NA 140 07/17/2021 1103   K 3.5 09/17/2021 1055   CL 101 09/17/2021 1055   CO2 33 (H) 09/17/2021 1055   BUN 26 (H) 09/17/2021 1055   BUN 14 07/17/2021 1103   CREATININE 1.31 (H) 09/17/2021 1055   CREATININE 1.18 11/15/2015 1114       Component Value Date/Time   CALCIUM 8.7 (L) 09/17/2021 1055   ALKPHOS 92 09/17/2021 1055   AST 21 09/17/2021 1055   ALT 16 09/17/2021 1055   BILITOT 2.3 (H) 09/17/2021 1055       RADIOGRAPHIC STUDIES: No results found.  ASSESSMENT AND PLAN: This is a very pleasant 81 years old [place] male diagnosed with Stage IV (T1b, N3, M1 C) non-small cell lung cancer, adenocarcinoma with positive ALK gene translocation in February 2023. The patient is currently undergoing treatment with Alecensa (Alectinib) 600 mg p.o. twice daily.  He has been tolerating the treatment well with no concerning complaints.  He is status post 5 months of treatment.  This treatment was interrupted for 2 weeks secondary to the venous thrombosis and hospitalization.  He has been tolerating this treatment fairly well with no concerning adverse effects. I recommended for the patient to continue his current treatment with Alecensa (Alectinib) with the same dose. For the hemolytic anemia he is currently on a tapered  dose of prednisone and there is improvement in his hemoglobin and hematocrit with this treatment. His bilirubin level is still elevated and we will continue to monitor it closely. For the history of deep venous thrombosis, he has IVC filter placed.  He is currently on Plavix but it may be a good idea to start him on a reduced dose Eliquis may be 2.5 mg p.o. twice daily for anticoagulation once his hematoma is better. I will see the patient back for follow-up visit in 3 weeks for evaluation and repeat blood work.  He would have completed the taper dose of prednisone by that time. He was advised to call immediately if he has any other concerning symptoms in the interval.  The patient voices understanding of current disease status and treatment options and is in agreement with the current care plan.  All questions were answered. The patient knows to call the clinic with any problems, questions or concerns. We can  certainly see the patient much sooner if necessary.  The total time spent in the appointment was 20 minutes.  Disclaimer: This note was dictated with voice recognition software. Similar sounding words can inadvertently be transcribed and may not be corrected upon review.

## 2021-09-18 DIAGNOSIS — M1990 Unspecified osteoarthritis, unspecified site: Secondary | ICD-10-CM | POA: Diagnosis not present

## 2021-09-18 DIAGNOSIS — I252 Old myocardial infarction: Secondary | ICD-10-CM | POA: Diagnosis not present

## 2021-09-18 DIAGNOSIS — C3491 Malignant neoplasm of unspecified part of right bronchus or lung: Secondary | ICD-10-CM | POA: Diagnosis not present

## 2021-09-18 DIAGNOSIS — I69990 Apraxia following unspecified cerebrovascular disease: Secondary | ICD-10-CM | POA: Diagnosis not present

## 2021-09-18 DIAGNOSIS — I2510 Atherosclerotic heart disease of native coronary artery without angina pectoris: Secondary | ICD-10-CM | POA: Diagnosis not present

## 2021-09-18 DIAGNOSIS — M21372 Foot drop, left foot: Secondary | ICD-10-CM | POA: Diagnosis not present

## 2021-09-18 DIAGNOSIS — I69354 Hemiplegia and hemiparesis following cerebral infarction affecting left non-dominant side: Secondary | ICD-10-CM | POA: Diagnosis not present

## 2021-09-18 DIAGNOSIS — I10 Essential (primary) hypertension: Secondary | ICD-10-CM | POA: Diagnosis not present

## 2021-09-18 DIAGNOSIS — Z7902 Long term (current) use of antithrombotics/antiplatelets: Secondary | ICD-10-CM | POA: Diagnosis not present

## 2021-09-18 DIAGNOSIS — M109 Gout, unspecified: Secondary | ICD-10-CM | POA: Diagnosis not present

## 2021-09-18 DIAGNOSIS — E1142 Type 2 diabetes mellitus with diabetic polyneuropathy: Secondary | ICD-10-CM | POA: Diagnosis not present

## 2021-09-18 DIAGNOSIS — Z86718 Personal history of other venous thrombosis and embolism: Secondary | ICD-10-CM | POA: Diagnosis not present

## 2021-09-18 DIAGNOSIS — D630 Anemia in neoplastic disease: Secondary | ICD-10-CM | POA: Diagnosis not present

## 2021-09-18 DIAGNOSIS — Z9181 History of falling: Secondary | ICD-10-CM | POA: Diagnosis not present

## 2021-09-19 ENCOUNTER — Other Ambulatory Visit (HOSPITAL_COMMUNITY): Payer: Self-pay

## 2021-09-19 ENCOUNTER — Other Ambulatory Visit: Payer: Self-pay

## 2021-09-25 ENCOUNTER — Ambulatory Visit (HOSPITAL_COMMUNITY)
Admission: RE | Admit: 2021-09-25 | Discharge: 2021-09-25 | Disposition: A | Discharge status: Home / Self Care | Payer: Medicare Other | Source: Ambulatory Visit | Attending: Cardiovascular Disease | Admitting: Cardiovascular Disease

## 2021-09-25 ENCOUNTER — Telehealth: Payer: Self-pay

## 2021-09-25 DIAGNOSIS — I82403 Acute embolism and thrombosis of unspecified deep veins of lower extremity, bilateral: Secondary | ICD-10-CM | POA: Diagnosis not present

## 2021-09-25 DIAGNOSIS — I82409 Acute embolism and thrombosis of unspecified deep veins of unspecified lower extremity: Secondary | ICD-10-CM | POA: Insufficient documentation

## 2021-09-25 DIAGNOSIS — C3490 Malignant neoplasm of unspecified part of unspecified bronchus or lung: Secondary | ICD-10-CM | POA: Diagnosis not present

## 2021-09-25 DIAGNOSIS — I6340 Cerebral infarction due to embolism of unspecified cerebral artery: Secondary | ICD-10-CM | POA: Diagnosis not present

## 2021-09-25 NOTE — Telephone Encounter (Signed)
DVT ultrasound ordered. Red team, can you schedule this and contact daughter with an appointment?  Thanks! Leeanne Rio, MD

## 2021-09-25 NOTE — Telephone Encounter (Signed)
Oh interesting. Can you contact daughter to make sure she knows about the appointment? Thanks Leeanne Rio, MD

## 2021-09-25 NOTE — Telephone Encounter (Signed)
[name] daughter calls nurse line regarding follow up for DVT. Patient has been following with [facility] and Oncologist regarding this concern. Daughter reports that oncologist is requesting that patient receive a DVT ultrasound study and evaluation of possible removal and restarting on blood thinner.   Recommended scheduling follow up with PCP. Scheduled for next available on 9/13.   Daughter is asking if ultrasound can be obtained prior to this appointment.   Will forward to PCP for further advisement.   Talbot Grumbling, RN

## 2021-09-25 NOTE — Telephone Encounter (Signed)
Attempted to call to schedule and she said it was already scheduled. Arthur [name] Holter, CMA

## 2021-09-27 DIAGNOSIS — M1990 Unspecified osteoarthritis, unspecified site: Secondary | ICD-10-CM | POA: Diagnosis not present

## 2021-09-27 DIAGNOSIS — Z9181 History of falling: Secondary | ICD-10-CM | POA: Diagnosis not present

## 2021-09-27 DIAGNOSIS — E1142 Type 2 diabetes mellitus with diabetic polyneuropathy: Secondary | ICD-10-CM | POA: Diagnosis not present

## 2021-09-27 DIAGNOSIS — M109 Gout, unspecified: Secondary | ICD-10-CM | POA: Diagnosis not present

## 2021-09-27 DIAGNOSIS — Z7902 Long term (current) use of antithrombotics/antiplatelets: Secondary | ICD-10-CM | POA: Diagnosis not present

## 2021-09-27 DIAGNOSIS — I10 Essential (primary) hypertension: Secondary | ICD-10-CM | POA: Diagnosis not present

## 2021-09-27 DIAGNOSIS — I69990 Apraxia following unspecified cerebrovascular disease: Secondary | ICD-10-CM | POA: Diagnosis not present

## 2021-09-27 DIAGNOSIS — I2510 Atherosclerotic heart disease of native coronary artery without angina pectoris: Secondary | ICD-10-CM | POA: Diagnosis not present

## 2021-09-27 DIAGNOSIS — Z86718 Personal history of other venous thrombosis and embolism: Secondary | ICD-10-CM | POA: Diagnosis not present

## 2021-09-27 DIAGNOSIS — C3491 Malignant neoplasm of unspecified part of right bronchus or lung: Secondary | ICD-10-CM | POA: Diagnosis not present

## 2021-09-27 DIAGNOSIS — I252 Old myocardial infarction: Secondary | ICD-10-CM | POA: Diagnosis not present

## 2021-09-27 DIAGNOSIS — I69354 Hemiplegia and hemiparesis following cerebral infarction affecting left non-dominant side: Secondary | ICD-10-CM | POA: Diagnosis not present

## 2021-09-27 DIAGNOSIS — M21372 Foot drop, left foot: Secondary | ICD-10-CM | POA: Diagnosis not present

## 2021-09-27 DIAGNOSIS — D630 Anemia in neoplastic disease: Secondary | ICD-10-CM | POA: Diagnosis not present

## 2021-09-28 ENCOUNTER — Telehealth: Payer: Self-pay

## 2021-09-28 ENCOUNTER — Other Ambulatory Visit: Payer: Self-pay

## 2021-09-28 NOTE — Telephone Encounter (Signed)
Leah from [place] for PT verbal orders as follows:  1 time(s) weekly for 6 week(s), Verbal orders given per [facility] protocol  Talbot Grumbling, RN

## 2021-10-05 DIAGNOSIS — C3491 Malignant neoplasm of unspecified part of right bronchus or lung: Secondary | ICD-10-CM | POA: Diagnosis not present

## 2021-10-05 DIAGNOSIS — M21372 Foot drop, left foot: Secondary | ICD-10-CM | POA: Diagnosis not present

## 2021-10-05 DIAGNOSIS — Z7902 Long term (current) use of antithrombotics/antiplatelets: Secondary | ICD-10-CM | POA: Diagnosis not present

## 2021-10-05 DIAGNOSIS — I252 Old myocardial infarction: Secondary | ICD-10-CM | POA: Diagnosis not present

## 2021-10-05 DIAGNOSIS — I2510 Atherosclerotic heart disease of native coronary artery without angina pectoris: Secondary | ICD-10-CM | POA: Diagnosis not present

## 2021-10-05 DIAGNOSIS — I69990 Apraxia following unspecified cerebrovascular disease: Secondary | ICD-10-CM | POA: Diagnosis not present

## 2021-10-05 DIAGNOSIS — M109 Gout, unspecified: Secondary | ICD-10-CM | POA: Diagnosis not present

## 2021-10-05 DIAGNOSIS — I10 Essential (primary) hypertension: Secondary | ICD-10-CM | POA: Diagnosis not present

## 2021-10-05 DIAGNOSIS — M1990 Unspecified osteoarthritis, unspecified site: Secondary | ICD-10-CM | POA: Diagnosis not present

## 2021-10-05 DIAGNOSIS — E1142 Type 2 diabetes mellitus with diabetic polyneuropathy: Secondary | ICD-10-CM | POA: Diagnosis not present

## 2021-10-05 DIAGNOSIS — Z9181 History of falling: Secondary | ICD-10-CM | POA: Diagnosis not present

## 2021-10-05 DIAGNOSIS — D630 Anemia in neoplastic disease: Secondary | ICD-10-CM | POA: Diagnosis not present

## 2021-10-05 DIAGNOSIS — Z86718 Personal history of other venous thrombosis and embolism: Secondary | ICD-10-CM | POA: Diagnosis not present

## 2021-10-05 DIAGNOSIS — I69354 Hemiplegia and hemiparesis following cerebral infarction affecting left non-dominant side: Secondary | ICD-10-CM | POA: Diagnosis not present

## 2021-10-08 ENCOUNTER — Other Ambulatory Visit (HOSPITAL_COMMUNITY): Payer: Self-pay

## 2021-10-08 DIAGNOSIS — E119 Type 2 diabetes mellitus without complications: Secondary | ICD-10-CM | POA: Diagnosis not present

## 2021-10-08 DIAGNOSIS — H5213 Myopia, bilateral: Secondary | ICD-10-CM | POA: Diagnosis not present

## 2021-10-08 DIAGNOSIS — H524 Presbyopia: Secondary | ICD-10-CM | POA: Diagnosis not present

## 2021-10-08 DIAGNOSIS — H26492 Other secondary cataract, left eye: Secondary | ICD-10-CM | POA: Diagnosis not present

## 2021-10-08 DIAGNOSIS — H52223 Regular astigmatism, bilateral: Secondary | ICD-10-CM | POA: Diagnosis not present

## 2021-10-09 ENCOUNTER — Ambulatory Visit: Payer: Medicare Other | Admitting: Neurology

## 2021-10-09 ENCOUNTER — Ambulatory Visit (INDEPENDENT_AMBULATORY_CARE_PROVIDER_SITE_OTHER): Payer: Medicare Other | Admitting: Neurology

## 2021-10-09 ENCOUNTER — Encounter: Payer: Self-pay | Admitting: Neurology

## 2021-10-09 VITALS — BP 134/48 | HR 75 | Ht 71.0 in | Wt 163.2 lb

## 2021-10-09 DIAGNOSIS — G992 Myelopathy in diseases classified elsewhere: Secondary | ICD-10-CM

## 2021-10-09 DIAGNOSIS — M21372 Foot drop, left foot: Secondary | ICD-10-CM

## 2021-10-09 DIAGNOSIS — N189 Chronic kidney disease, unspecified: Secondary | ICD-10-CM | POA: Diagnosis not present

## 2021-10-09 DIAGNOSIS — G5702 Lesion of sciatic nerve, left lower limb: Secondary | ICD-10-CM | POA: Diagnosis not present

## 2021-10-09 DIAGNOSIS — C7951 Secondary malignant neoplasm of bone: Secondary | ICD-10-CM | POA: Diagnosis not present

## 2021-10-09 DIAGNOSIS — M4804 Spinal stenosis, thoracic region: Secondary | ICD-10-CM

## 2021-10-09 DIAGNOSIS — M48062 Spinal stenosis, lumbar region with neurogenic claudication: Secondary | ICD-10-CM | POA: Diagnosis not present

## 2021-10-09 NOTE — Patient Instructions (Signed)
MRI lumbar spine, MRI Thoracic spine, MRI femur Orthotic that can be work without a shoe Blood work today

## 2021-10-09 NOTE — Progress Notes (Unsigned)
[facility]    Provider:  Dr Jaynee Eagles Requesting Provider: Lind Covert, * Primary Care Provider:  Lind Covert, MD  CC:  left foot drop  HPI:  Arthur [name] is a 81 y.o. male here as requested by Lind Covert, * for foot frop in the setting of swollen foot and ankle and non-small cell carcinoma, pulmonary embolus, essential hypertension, DVT, coronary artery disease, cerebral embolism with infarction, stage IV non-small cell carcinoma of the lung adenocarcinoma,.  I reviewed Dr. Margaretha Sheffield notes, persistent foot drop with no improvement, diabetes, arthritis, left foot drop, antineoplastic chemotherapy, anemia, initially thought to be compressive from thigh hematoma but is not improving despite resolution of hematoma, referred to neurology for further evaluation.  I reviewed his notes, patient was hospitalized in May 2023, acute renal failure, bacteremia, anemia, left thigh hematoma and left foot drop, stage IV non-small cell lung cancer, sensation was lost on the left foot and unable to dorsiflex or plantarflex, MRI of the femur showed a large well-defined fluid collection within the posterior consistent with a hematoma and a small sized Baker's cyst, MRI lumbar spine showed marked canal stenosis at L4-L5 with effacement of the subarticular recesses and crowding of the cauda equina nerve roots, foraminal narrowing is also greatest at L4-L5, PT OT was recommended.  Patient is here with his brother, Foot drop happened in the hospital, having home physical therapy, he has had another DVT since leaving the hospital, he has low back pain, still has swelling in the left foot and ankle, no improvement in foot dorsiflexion or plantarflexion, happened while in the hospital, never improved, has an orthotic that helps keep the foot flexed  but can't wear the shoes due to the swelling and the orthotic fits in a shoe, may need a different orthotic. Hematoma was in  the back of the knee and they deny swelling in the thigh, he had swelling in the calf due to DVT. He has left leg pain, also proximal weakness as well.  Reviewed notes, labs and imaging(and agree with findings) from outside physicians, which showed:  MRI lumbar spine 06/15/2021: IMPRESSION: Multilevel degenerative changes superimposed on congenital narrowing of the spinal canal. There is marked canal stenosis at L4-L5 with effacement of subarticular recesses and crowding of the cauda equina nerve roots. Foraminal narrowing is also greatest at L4-L5.   Scattered foci of abnormal signal corresponding to sclerotic metastases on prior imaging. The requested T11 level was inadvertently not included. However, there is no evidence of extraosseous extension on the prior CT abdomen.    EXAM: MR OF THE LEFT FEMUR WITHOUT CONTRAST   TECHNIQUE: Multiplanar, multisequence MR imaging of the left thigh was performed. No intravenous contrast was administered.   COMPARISON:  CT 06/13/2021   FINDINGS: Bones/Joint/Cartilage   No acute fracture. No dislocation. No femoral head avascular necrosis. No bone marrow edema. No marrow replacing bone lesion. Mild degenerative changes of the left hip. No left hip joint effusion. Moderate degenerative changes of the left knee. Posterior horn of the medial meniscus of the left knee is torn (series 18, images 9-10). Small-moderate-sized Baker's cyst.   Ligaments   Not well assessed at the edges of the field of view. No acute injury is evident.   Muscles and Tendons   Large elongated well-defined complex fluid collection within the posterior compartment of the mid left thigh. Collection measures 23.0 x 4.9 x 6.5 cm. Collection demonstrates intermediate to high T1 signal and is heterogeneously hyperintense on  T2 weighted imaging. Serpiginous band-like areas of relatively decreased T2 signal intensity within the collection. No definite solid component.  Mild intramuscular edema within the adjacent posterior compartment musculature of the left thigh, likely reactive.   Generalized muscle atrophy. No acute tendinous abnormality. Left hamstring tendons intact. Incidentally noted lipoma superficial to the proximal left rectus femoris muscle measuring 7.5 x 2.5 x 4.2 cm (series 4, image 26). No internal complexity.   Soft tissues   There is a large right inguinal hernia containing bowel. Generalized soft tissue edema. No additional fluid collections. No left inguinal lymphadenopathy.   IMPRESSION: 1. Large well-defined fluid collection within the posterior compartment of the mid left thigh measuring 23.0 x 4.9 x 6.5 cm. Appearance is most consistent with a hematoma. 2. No acute osseous abnormality.  No suspicious bone lesion. 3. Large right inguinal hernia containing bowel. 4. Posterior horn of the medial meniscus of the left knee is torn. 5. Small-moderate-sized Baker's cyst.  Review of Systems: Patient complains of symptoms per HPI as well as the following symptoms left leg weakness. Pertinent negatives and positives per HPI. All others negative.   Social History   Socioeconomic History   Marital status: Married    Spouse name: Not on file   Number of children: Not on file   Years of education: Not on file   Highest education level: Not on file  Occupational History   Not on file  Tobacco Use   Smoking status: Never   Smokeless tobacco: Never  Substance and Sexual Activity   Alcohol use: No   Drug use: No   Sexual activity: Not on file  Other Topics Concern   Not on file  Social History Narrative   Social History:   He works part-time as a Doctor, general practice. He is married with 3 children. He's never smoked cigarettes and does not drink alcohol.            Social Determinants of Health   Financial Resource Strain: Not on file  Food Insecurity: Not on file  Transportation Needs: Not on file  Physical Activity: Not on file   Stress: Not on file  Social Connections: Not on file  Intimate Partner Violence: Not on file    Family History  Problem Relation Age of Onset   Coronary artery disease Father    Coronary artery disease Mother     Past Medical History:  Diagnosis Date   Diabetes mellitus without complication ([place])    High cholesterol    Hypertension    lung ca    Normal nuclear stress test 01/29/2008   stress perfusion study apparently in 2010 in [facility] which he said was negative.    Patient Active Problem List   Diagnosis Date Noted   Renal insufficiency 07/04/2021   Foot drop, left foot 07/04/2021   Thrombophlebitis arm 06/21/2021   Non-small cell carcinoma of lung, stage 4 ([place]) 06/15/2021   Encounter for antineoplastic chemotherapy 06/13/2021   Deep venous embolism and thrombosis ([place]) 05/16/2021   Groin swelling 05/16/2021   Adenocarcinoma of right lung, stage 4 ([place]) 04/09/2021   Cerebral embolism with cerebral infarction 03/18/2021   Adenopathy 03/06/2021   History of cardiac arrest 02/19/2021   Status post coronary artery stent placement    Coronary artery disease involving native heart    Pulmonary nodules/lesions, multiple    Pulmonary embolus ([place]) 02/03/2021   Anemia 09/06/2015   GERD (gastroesophageal reflux disease) 07/05/2013   Arthritis 02/26/2010   Gout 11/06/2009  Diabetes mellitus, type II ([place]) 09/22/2009   Hyperlipidemia 09/22/2009   Essential hypertension, benign 09/22/2009    Past Surgical History:  Procedure Laterality Date   CATARACT EXTRACTION, BILATERAL  2006   CORONARY STENT INTERVENTION N/A 02/06/2021   Procedure: CORONARY STENT INTERVENTION;  Surgeon: Jettie Booze, MD;  Location: [facility];  Service: Cardiovascular;  Laterality: N/A;   FINE NEEDLE ASPIRATION  03/21/2021   Procedure: FINE NEEDLE ASPIRATION;  Surgeon: Garner Nash, DO;  Location: [facility];  Service: Pulmonary;;   INTRAVASCULAR ULTRASOUND/IVUS N/A  02/06/2021   Procedure: Intravascular Ultrasound/IVUS;  Surgeon: Jettie Booze, MD;  Location: [facility];  Service: Cardiovascular;  Laterality: N/A;   KNEE SURGERY  2005   LEFT HEART CATH AND CORONARY ANGIOGRAPHY N/A 02/06/2021   Procedure: LEFT HEART CATH AND CORONARY ANGIOGRAPHY;  Surgeon: Jettie Booze, MD;  Location: [facility];  Service: Cardiovascular;  Laterality: N/A;   VIDEO BRONCHOSCOPY WITH ENDOBRONCHIAL ULTRASOUND Bilateral 03/21/2021   Procedure: VIDEO BRONCHOSCOPY WITH ENDOBRONCHIAL ULTRASOUND;  Surgeon: Garner Nash, DO;  Location: [place];  Service: Pulmonary;  Laterality: Bilateral;    Current Outpatient Medications  Medication Sig Dispense Refill   alectinib (ALECENSA) 150 MG capsule Take 4 capsules (600 mg total) by mouth 2 (two) times daily with a meal. 240 capsule 3   clopidogrel (PLAVIX) 75 MG tablet Take 1 tablet (75 mg total) by mouth daily. 90 tablet 1   ferrous sulfate 324 (65 Fe) MG TBEC Take 324 mg by mouth daily. 30 tablet    omeprazole (PRILOSEC) 20 MG capsule Take 20 mg by mouth daily.     rosuvastatin (CRESTOR) 5 MG tablet Take 1 tablet by mouth once daily 90 tablet 0   apixaban (ELIQUIS) 2.5 MG TABS tablet Take 1 tablet (2.5 mg total) by mouth 2 (two) times daily. 60 tablet 3   No current facility-administered medications for this visit.    Allergies as of 10/09/2021 - Review Complete 10/09/2021  Allergen Reaction Noted   Ace inhibitors  11/06/2009   Iohexol  06/25/2021    Vitals: BP (!) 134/48   Pulse 75   Ht 5\' 11"  (1.803 m)   Wt 163 lb 3.2 oz (74 kg)   BMI 22.76 kg/m  Last Weight:  Wt Readings from Last 1 Encounters:  10/10/21 162 lb 9.6 oz (73.8 kg)   Last Height:   Ht Readings from Last 1 Encounters:  10/09/21 5\' 11"  (1.803 m)     Physical exam: Exam: Gen: NAD, conversant, well nourised, well groomed                     CV: RRR, no MRG. No Carotid Bruits. + peripheral edema ankle and foot, warm,  nontender Eyes: Conjunctivae clear without exudates or hemorrhage  Neuro: Detailed Neurologic Exam  Speech:    Speech is normal; fluent and spontaneous with normal comprehension.  Cognition:    The patient is oriented to person, place, and time;     recent and remote memory intact;     language fluent;     normal attention, concentration,     fund of knowledge Cranial Nerves:    The pupils are equal, round, and reactive to light. Pupils too small to visualize fundi, attempted. Visual fields are full to finger confrontation. Extraocular movements are intact. Trigeminal sensation is intact and the muscles of mastication are normal. The face is symmetric. The palate elevates in the midline. Hearing  intact. Voice is normal. Shoulder shrug is normal. The tongue has normal motion without fasciculations.   Coordination:    Normal    Gait:     Steppage on left   Motor Observation:    No asymmetry, no atrophy, and no involuntary movements noted. Tone:    Normal muscle tone.    Posture:    Posture is normal. normal erect    Strength: left hip and leg flexion weakness 3+/5, left DF/PF/foot eversion and inversion 0/5        Sensation: decreased L5 distribution left leg      Reflex Exam:  DTR's: absent AJs, otherwise deep tendon reflexes in the upper and lower extremities are normal bilaterally.   Toes:    The toes are downgoing bilaterally.   Clonus:    Clonus is absent.    Assessment/Plan:  Patient with left foot dorsiflexion and plantar flexion weakness, left foot eversion and inversion weakness and left leg flexion weakness after hospitalization. MRI showed "Large well-defined fluid collection within the posterior compartment of the mid left thigh measuring 23.0 x 4.9 x 6.5 cm.Appearance is most consistent with a hematoma.". MRI lumbar spine showed marked canal stenosis at L4-L5 with effacement of subarticular recesses and crowding of the cauda equina nerve roots, also with  foraminal narrowing greatest at L4-L5 in addition to scattered foci of abnormal signal corresponding to sclerotic Metastases. There also appeared to be stenosis at T11.  Differential includes lumbosacral radiculopathy, sciatic neuropathy or possibly T11 myelopathy.   Need repeat MRI femur/Thoracic and Lumbar spine to evaluate for sciatic enhancement in the posterior thigh compartment, metastatic disease of the spine, myelopathy of the thoracic spine, progressive lumbar/lumbosacral central and foraminal stenosis.   Femur: Evaluate for metastatic disease and enhancement/pathology to the sciatic nerve Thoracic: evaluate for metastatic spine disease and myelopathy Lumbar: evaluate for metastatic spine disease, spinal stenosis and radiculopathy   Orders Placed This Encounter  Procedures   MR THORACIC SPINE W WO CONTRAST   MR Lumbar Spine W Wo Contrast   MR FEMUR LEFT W WO CONTRAST   Creatine   BUN   No orders of the defined types were placed in this encounter.   Cc: Doralee Albino, MD  Sarina Ill, MD  [facility] [address] [place], [place] [address]  Phone [phone] Fax [phone]   I spent over  90 minutes of face-to-face and non-face-to-face time with patient on the  1. Left foot drop   2. Chronic kidney disease, unspecified CKD stage   3. Metastatic cancer to spine ([place])   4. 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 Sciatic neuropathy, left   6. Spinal stenosis of lumbar region with neurogenic claudication   7. Myelopathy concurrent with and due to spinal stenosis of thoracic region [facility])    diagnosis.  This included previsit chart review, lab review, study review, order entry, electronic health record documentation, patient education on the different diagnostic and therapeutic options, counseling and coordination of care, risks and benefits of management, compliance, or risk factor  reduction

## 2021-10-09 NOTE — Progress Notes (Unsigned)
    SUBJECTIVE:   CHIEF COMPLAINT / HPI:   Foot Drop Saw Dr Jaynee Eagles 9/11  Lung Cancer Seeing Dr Burr Medico today  DVT Doppler on 8/29 "Findings consistent with age indeterminate deep vein thrombosis  involving the left soleal veins.  - Findings consistent with chronic deep vein thrombosis involving the left  posterior tibial veins, and left peroneal veins.  - A cystic structure is found in the popliteal fossa"  Anemia   Diabetes A1c 5.2 in July 2023  Hypertension   PERTINENT  PMH / PSH: ***  OBJECTIVE:   There were no vitals taken for this visit.  ***  ASSESSMENT/PLAN:   No problem-specific Assessment & Plan notes found for this encounter.     Arthur Covert, MD [place]

## 2021-10-10 ENCOUNTER — Encounter: Payer: Self-pay | Admitting: Neurology

## 2021-10-10 ENCOUNTER — Ambulatory Visit (INDEPENDENT_AMBULATORY_CARE_PROVIDER_SITE_OTHER): Payer: Medicare Other | Admitting: Family Medicine

## 2021-10-10 ENCOUNTER — Other Ambulatory Visit: Payer: Self-pay

## 2021-10-10 ENCOUNTER — Inpatient Hospital Stay (HOSPITAL_BASED_OUTPATIENT_CLINIC_OR_DEPARTMENT_OTHER): Payer: Medicare Other | Admitting: Internal Medicine

## 2021-10-10 ENCOUNTER — Inpatient Hospital Stay: Payer: Medicare Other | Attending: Physician Assistant

## 2021-10-10 ENCOUNTER — Encounter: Payer: Self-pay | Admitting: Family Medicine

## 2021-10-10 ENCOUNTER — Telehealth: Payer: Self-pay | Admitting: *Deleted

## 2021-10-10 VITALS — BP 129/46 | HR 72 | Temp 98.3°F | Resp 18

## 2021-10-10 DIAGNOSIS — I10 Essential (primary) hypertension: Secondary | ICD-10-CM | POA: Diagnosis not present

## 2021-10-10 DIAGNOSIS — D649 Anemia, unspecified: Secondary | ICD-10-CM | POA: Diagnosis not present

## 2021-10-10 DIAGNOSIS — Z955 Presence of coronary angioplasty implant and graft: Secondary | ICD-10-CM | POA: Diagnosis not present

## 2021-10-10 DIAGNOSIS — E1169 Type 2 diabetes mellitus with other specified complication: Secondary | ICD-10-CM | POA: Diagnosis not present

## 2021-10-10 DIAGNOSIS — Z86711 Personal history of pulmonary embolism: Secondary | ICD-10-CM | POA: Diagnosis not present

## 2021-10-10 DIAGNOSIS — C3491 Malignant neoplasm of unspecified part of right bronchus or lung: Secondary | ICD-10-CM | POA: Diagnosis not present

## 2021-10-10 DIAGNOSIS — I82502 Chronic embolism and thrombosis of unspecified deep veins of left lower extremity: Secondary | ICD-10-CM | POA: Diagnosis not present

## 2021-10-10 DIAGNOSIS — Z7901 Long term (current) use of anticoagulants: Secondary | ICD-10-CM | POA: Diagnosis not present

## 2021-10-10 DIAGNOSIS — C3490 Malignant neoplasm of unspecified part of unspecified bronchus or lung: Secondary | ICD-10-CM | POA: Diagnosis not present

## 2021-10-10 DIAGNOSIS — M21372 Foot drop, left foot: Secondary | ICD-10-CM | POA: Diagnosis not present

## 2021-10-10 DIAGNOSIS — Z8673 Personal history of transient ischemic attack (TIA), and cerebral infarction without residual deficits: Secondary | ICD-10-CM | POA: Insufficient documentation

## 2021-10-10 DIAGNOSIS — D589 Hereditary hemolytic anemia, unspecified: Secondary | ICD-10-CM | POA: Diagnosis not present

## 2021-10-10 DIAGNOSIS — Z79899 Other long term (current) drug therapy: Secondary | ICD-10-CM | POA: Diagnosis not present

## 2021-10-10 LAB — CMP (CANCER CENTER ONLY)
ALT: 21 U/L (ref 0–44)
AST: 31 U/L (ref 15–41)
Albumin: 3.1 g/dL — ABNORMAL LOW (ref 3.5–5.0)
Alkaline Phosphatase: 65 U/L (ref 38–126)
Anion gap: 7 (ref 5–15)
BUN: 24 mg/dL — ABNORMAL HIGH (ref 8–23)
CO2: 27 mmol/L (ref 22–32)
Calcium: 8.3 mg/dL — ABNORMAL LOW (ref 8.9–10.3)
Chloride: 106 mmol/L (ref 98–111)
Creatinine: 1.26 mg/dL — ABNORMAL HIGH (ref 0.61–1.24)
GFR, Estimated: 57 mL/min — ABNORMAL LOW (ref >60)
Glucose, Bld: 141 mg/dL — ABNORMAL HIGH (ref 70–99)
Potassium: 3.8 mmol/L (ref 3.5–5.1)
Sodium: 140 mmol/L (ref 135–145)
Total Bilirubin: 2 mg/dL — ABNORMAL HIGH (ref 0.3–1.2)
Total Protein: 6.4 g/dL — ABNORMAL LOW (ref 6.5–8.1)

## 2021-10-10 LAB — CBC WITH DIFFERENTIAL (CANCER CENTER ONLY)
Abs Immature Granulocytes: 0.07 10*3/uL (ref 0.00–0.07)
Basophils Absolute: 0.1 10*3/uL (ref 0.0–0.1)
Basophils Relative: 1 %
Eosinophils Absolute: 0.4 10*3/uL (ref 0.0–0.5)
Eosinophils Relative: 6 %
HCT: 26.7 % — ABNORMAL LOW (ref 39.0–52.0)
Hemoglobin: 9.2 g/dL — ABNORMAL LOW (ref 13.0–17.0)
Immature Granulocytes: 1 %
Lymphocytes Relative: 26 %
Lymphs Abs: 2 10*3/uL (ref 0.7–4.0)
MCH: 29.3 pg (ref 26.0–34.0)
MCHC: 34.5 g/dL (ref 30.0–36.0)
MCV: 85 fL (ref 80.0–100.0)
Monocytes Absolute: 0.8 10*3/uL (ref 0.1–1.0)
Monocytes Relative: 10 %
Neutro Abs: 4.4 10*3/uL (ref 1.7–7.7)
Neutrophils Relative %: 56 %
Platelet Count: 232 10*3/uL (ref 150–400)
RBC: 3.14 MIL/uL — ABNORMAL LOW (ref 4.22–5.81)
RDW: 16.6 % — ABNORMAL HIGH (ref 11.5–15.5)
WBC Count: 7.7 10*3/uL (ref 4.0–10.5)
nRBC: 0 % (ref 0.0–0.2)

## 2021-10-10 LAB — LACTATE DEHYDROGENASE: LDH: 259 U/L — ABNORMAL HIGH (ref 98–192)

## 2021-10-10 MED ORDER — APIXABAN 2.5 MG PO TABS: 2.5000 mg | ORAL_TABLET | Freq: Two times a day (BID) | ORAL | 3 refills | Status: DC

## 2021-10-10 NOTE — Progress Notes (Signed)
[facility] Telephone:([phone]) [phone]   [phone]  OFFICE PROGRESS NOTE  Lind Covert, MD [place] [address]  DIAGNOSIS:  1) Stage IV (T1b, N3, M1 C) non-small cell lung cancer, adenocarcinoma with positive ALK gene translocation diagnosed in February 2023. 2) deep venous thrombosis of the left lower extremity  PRIOR THERAPY: None  CURRENT THERAPY:  1) Alecensa (Alectinib) 600 mg p.o. twice daily.  First dose April 11, 2021.  Status post 5 months of treatment. 2) Eliquis 2.5 mg p.o. twice daily.  First dose 10/11/2021  INTERVAL HISTORY: Arthur [name] 81 y.o. male returns to the clinic today for follow-up visit accompanied by his son.  The patient is feeling fine today with no concerning complaints except for the mild swelling of the lower extremities.  He was seen recently by his primary care physician Dr. Erin Hearing and he had Doppler of the lower extremities that showed chronic deep venous thrombosis of the left lower extremity.  The patient has been on treatment with Plavix for cardiac condition and stroke in the past.  He also has IVC filter.  He was treated with anticoagulation in the past but he had significant hematoma and the anticoagulation was discontinued.  He denied having any current chest pain, shortness of breath, cough or hemoptysis.  He denied having any fever or chills.  He has no nausea, vomiting, diarrhea or constipation.  He continues to tolerate his treatment with Alecensa (Alectinib) fairly well.  He completed a course of prednisone taper for suspicious hemolysis.  He is here today for evaluation and repeat blood work.  MEDICAL HISTORY: Past Medical History:  Diagnosis Date   Diabetes mellitus without complication ([place])    High cholesterol    Hypertension    lung ca    Normal nuclear stress test 01/29/2008   stress perfusion study apparently in 2010 in [facility] which he said was negative.     ALLERGIES:  is allergic to ace inhibitors and iohexol.  MEDICATIONS:  Current Outpatient Medications  Medication Sig Dispense Refill   acetaminophen (TYLENOL) 650 MG CR tablet Take 650 mg by mouth every 8 (eight) hours as needed for pain. (Patient not taking: Reported on 10/09/2021)     alectinib (ALECENSA) 150 MG capsule Take 4 capsules (600 mg total) by mouth 2 (two) times daily with a meal. 240 capsule 3   clopidogrel (PLAVIX) 75 MG tablet Take 1 tablet (75 mg total) by mouth daily. 90 tablet 1   ferrous sulfate 324 (65 Fe) MG TBEC Take 324 mg by mouth daily. 30 tablet    omeprazole (PRILOSEC) 20 MG capsule Take 20 mg by mouth daily.     polyethylene glycol powder (GLYCOLAX/MIRALAX) 17 GM/SCOOP powder Take 17 g by mouth daily. (Patient not taking: Reported on 10/10/2021) 3350 g 1   rosuvastatin (CRESTOR) 5 MG tablet Take 1 tablet by mouth once daily 90 tablet 0   trolamine salicylate (ASPER-FLEX) 10 % cream Apply 1 application topically as needed for muscle pain. (Patient not taking: Reported on 10/09/2021) 85 g 0   No current facility-administered medications for this visit.    SURGICAL HISTORY:  Past Surgical History:  Procedure Laterality Date   CATARACT EXTRACTION, BILATERAL  2006   CORONARY STENT INTERVENTION N/A 02/06/2021   Procedure: CORONARY STENT INTERVENTION;  Surgeon: Jettie Booze, MD;  Location: [facility];  Service: Cardiovascular;  Laterality: N/A;   FINE NEEDLE ASPIRATION  03/21/2021  Procedure: FINE NEEDLE ASPIRATION;  Surgeon: Garner Nash, DO;  Location: [facility];  Service: Pulmonary;;   INTRAVASCULAR ULTRASOUND/IVUS N/A 02/06/2021   Procedure: Intravascular Ultrasound/IVUS;  Surgeon: Jettie Booze, MD;  Location: [facility];  Service: Cardiovascular;  Laterality: N/A;   KNEE SURGERY  2005   LEFT HEART CATH AND CORONARY ANGIOGRAPHY N/A 02/06/2021   Procedure: LEFT HEART CATH AND CORONARY ANGIOGRAPHY;  Surgeon: Jettie Booze, MD;  Location: [facility];  Service: Cardiovascular;  Laterality: N/A;   VIDEO BRONCHOSCOPY WITH ENDOBRONCHIAL ULTRASOUND Bilateral 03/21/2021   Procedure: VIDEO BRONCHOSCOPY WITH ENDOBRONCHIAL ULTRASOUND;  Surgeon: Garner Nash, DO;  Location: [facility];  Service: Pulmonary;  Laterality: Bilateral;    REVIEW OF SYSTEMS:  Constitutional: positive for fatigue Eyes: negative Ears, nose, mouth, throat, and face: negative Respiratory: negative Cardiovascular: negative Gastrointestinal: negative Genitourinary:negative Integument/breast: negative Hematologic/lymphatic: negative Musculoskeletal:positive for muscle weakness Neurological: negative Behavioral/Psych: negative Endocrine: negative Allergic/Immunologic: negative   PHYSICAL EXAMINATION: General appearance: alert, cooperative, fatigued, and no distress Head: Normocephalic, without obvious abnormality, atraumatic Neck: no adenopathy, no JVD, supple, symmetrical, trachea midline, and thyroid not enlarged, symmetric, no tenderness/mass/nodules Lymph nodes: Cervical, supraclavicular, and axillary nodes normal. Resp: clear to auscultation bilaterally Back: symmetric, no curvature. ROM normal. No CVA tenderness. Cardio: regular rate and rhythm, S1, S2 normal, no murmur, click, rub or gallop GI: soft, non-tender; bowel sounds normal; no masses,  no organomegaly Extremities: edema 1+ edema in the left lower extremity Neurologic: Alert and oriented X 3, normal strength and tone. Normal symmetric reflexes. Normal coordination and gait  ECOG PERFORMANCE STATUS: 1 - Symptomatic but completely ambulatory  Blood pressure (!) 129/46, pulse 72, temperature 98.3 F (36.8 C), temperature source Oral, resp. rate 18, SpO2 100 %.  LABORATORY DATA: Lab Results  Component Value Date   WBC 7.7 10/10/2021   HGB 9.2 (L) 10/10/2021   HCT 26.7 (L) 10/10/2021   MCV 85.0 10/10/2021   PLT 232 10/10/2021      Chemistry       Component Value Date/Time   NA 138 09/17/2021 1055   NA 140 07/17/2021 1103   K 3.5 09/17/2021 1055   CL 101 09/17/2021 1055   CO2 33 (H) 09/17/2021 1055   BUN 21 10/09/2021 1147   CREATININE 1.31 (H) 09/17/2021 1055   CREATININE 1.18 11/15/2015 1114      Component Value Date/Time   CALCIUM 8.7 (L) 09/17/2021 1055   ALKPHOS 92 09/17/2021 1055   AST 21 09/17/2021 1055   ALT 16 09/17/2021 1055   BILITOT 2.3 (H) 09/17/2021 1055       RADIOGRAPHIC STUDIES: VAS [place] LOWER EXTREMITY VENOUS (DVT)  Result Date: 09/27/2021  Lower Venous DVT Study Patient Name:  BRENDAN GADSON [facility]  Date of Exam:   09/25/2021 Medical Rec #: [id number]          Accession #:    [id number] Date of Birth: May 24, 1940           Patient Gender: M Patient Age:   81 years Exam Location:  [facility] Procedure:      VAS [place] LOWER EXTREMITY VENOUS (DVT) Referring Phys: Chrisandra Netters --------------------------------------------------------------------------------  Indications: Patient presents for follow-up DVT study. 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 Patient has an IVC filter placed.  Risk Factors: Lung cancer, history of DVT and PE. Anticoagulation: IVC filter placed. Comparison Study: Previous bilateral venous duplex performed at [facility] on 03/19/21 showed acute DVT in the bilateral                   popliteal, posterior tibial, peroneal and soleal veins. Performing Technologist: Mariane Masters RVT  Examination Guidelines: A complete evaluation includes B-mode imaging, spectral Doppler, color Doppler, and power Doppler as needed of all accessible portions of each vessel. Bilateral testing is considered an integral part of a complete examination. Limited examinations for reoccurring indications may be performed as noted. The reflux portion of the exam is performed with the patient in reverse Trendelenburg.  +-----+---------------+---------+-----------+----------+--------------+  RIGHTCompressibilityPhasicitySpontaneityPropertiesThrombus Aging +-----+---------------+---------+-----------+----------+--------------+ CFV  Full           Yes      Yes                                 +-----+---------------+---------+-----------+----------+--------------+   +---------+---------------+---------+-----------+----------+-------------------+ LEFT     CompressibilityPhasicitySpontaneityPropertiesThrombus Aging      +---------+---------------+---------+-----------+----------+-------------------+ CFV      Full           Yes      Yes                                      +---------+---------------+---------+-----------+----------+-------------------+ SFJ      Full           Yes      Yes                                      +---------+---------------+---------+-----------+----------+-------------------+ FV Prox  Full           Yes      Yes                                      +---------+---------------+---------+-----------+----------+-------------------+ FV Mid   Full           Yes      Yes                                      +---------+---------------+---------+-----------+----------+-------------------+ FV DistalFull           Yes      Yes                                      +---------+---------------+---------+-----------+----------+-------------------+ POP      Full           Yes      Yes                                      +---------+---------------+---------+-----------+----------+-------------------+ PTV      Partial                                                          +---------+---------------+---------+-----------+----------+-------------------+  PERO     Partial                                                          +---------+---------------+---------+-----------+----------+-------------------+ Soleal   None           No       No                   Age                                                                        Indeterminate/conti                                                       nued                +---------+---------------+---------+-----------+----------+-------------------+ Gastroc  Full                                                             +---------+---------------+---------+-----------+----------+-------------------+ GSV      Full           Yes      Yes                                      +---------+---------------+---------+-----------+----------+-------------------+ Difficult to determine aging of calf vein thrombus due to shadowing from calcified plaque in the arteries and poor visualization. There appears to be partial compressibility in the posterior tibial and peroneal veins on today's study.  Left Technical Findings: Cystic structure noted in the popliteal fossa measuring 3.0 x 1.1 cm.   Summary: RIGHT: - No evidence of common femoral vein obstruction.  LEFT: - Findings consistent with age indeterminate deep vein thrombosis involving the left soleal veins. - Findings consistent with chronic deep vein thrombosis involving the left posterior tibial veins, and left peroneal veins. - A cystic structure is found in the popliteal fossa. - All other veins visualized appear fully compressible and demonstrate appropriate Doppler characteristics.  *See table(s) above for measurements and observations. Electronically signed by Carlyle Dolly MD on 09/27/2021 at 9:51:58 AM.    Final     ASSESSMENT AND PLAN: This is a very pleasant 81 years old [place] male diagnosed with Stage IV (T1b, N3, M1 C) non-small cell lung cancer, adenocarcinoma with positive ALK gene translocation in February 2023. The patient is currently undergoing treatment with Alecensa (Alectinib) 600 mg p.o. twice daily.  He has been tolerating the treatment well with no concerning complaints.  He is status post 6 months of treatment.  This treatment was interrupted for 2 weeks secondary  to the  venous thrombosis and hospitalization.   The patient has been tolerating his treatment with Alecensa (Alectinib) fairly well with no concerning adverse effects. I recommended for him to continue his treatment with Alecensa (Alectinib) with the same dose. For the hemolytic anemia, he was treated with a tapered dose of prednisone.  There was initial improvement of his hemoglobin and hematocrit but his hemoglobin is down to 9.1 today.  The patient is asymptomatic.  I will continue to monitor him closely with repeat blood work in 2 weeks.  He will also continue on the oral iron tablet with fusion plus. For the history of deep venous thrombosis and pulmonary embolism, he had IVC filter placed and he was on treatment with Plavix.  The recent Doppler of the lower extremity showed persistent and chronic deep venous thrombosis involving the left lower extremity. I recommended for the patient to start treatment with Eliquis 2.5 mg p.o. twice daily, starting tomorrow with close monitoring for any bleeding issues. He was advised to call immediately if he has any other concerning symptoms in the interval. The patient voices understanding of current disease status and treatment options and is in agreement with the current care plan.  All questions were answered. The patient knows to call the clinic with any problems, questions or concerns. We can certainly see the patient much sooner if necessary.  The total time spent in the appointment was 30 minutes.  Disclaimer: This note was dictated with voice recognition software. Similar sounding words can inadvertently be transcribed and may not be corrected upon review.

## 2021-10-10 NOTE — Assessment & Plan Note (Signed)
Saw Dr Jaynee Eagles yesterday - MRI pending

## 2021-10-10 NOTE — Assessment & Plan Note (Signed)
Systolic mildly elevated today.  Will not start medications unless worsens and once his hgb is stable

## 2021-10-10 NOTE — Assessment & Plan Note (Signed)
Improving.  He will see heme/onc today

## 2021-10-10 NOTE — Telephone Encounter (Signed)
Fax confirmation received from [employer] for L foot orthotic.  [phone] fax.  Ph [phone].

## 2021-10-10 NOTE — Patient Instructions (Signed)
Good to see you today - Thank you for coming in  Things we discussed today:  Blood pressure We will continue to watch.  You may need to be back on blood pressure medicine as you improve  Diabetes Come back in 1-2 months for a A1c check  Blood Clot and Blood thinner Discuss with Dr Julien Nordmann as to removal of filter or blood thinner    Please always bring your medication bottles  Come back to see me in 2 months

## 2021-10-10 NOTE — Assessment & Plan Note (Signed)
Blood sugar are stable on his frequent labs off all meds. A1c next visit.  Will likely need to be back on medications probably SGLT2 for diabetes and cardiac protection

## 2021-10-11 ENCOUNTER — Encounter: Payer: Self-pay | Admitting: Family Medicine

## 2021-10-11 ENCOUNTER — Other Ambulatory Visit: Payer: Self-pay | Admitting: Medical Oncology

## 2021-10-11 ENCOUNTER — Telehealth: Payer: Self-pay | Admitting: Neurology

## 2021-10-11 ENCOUNTER — Other Ambulatory Visit: Payer: Self-pay

## 2021-10-11 DIAGNOSIS — M79606 Pain in leg, unspecified: Secondary | ICD-10-CM | POA: Diagnosis not present

## 2021-10-11 DIAGNOSIS — S7010XA Contusion of unspecified thigh, initial encounter: Secondary | ICD-10-CM | POA: Diagnosis not present

## 2021-10-11 DIAGNOSIS — C3491 Malignant neoplasm of unspecified part of right bronchus or lung: Secondary | ICD-10-CM

## 2021-10-11 LAB — BUN: BUN: 21 mg/dL (ref 8–27)

## 2021-10-11 LAB — HM DIABETES EYE EXAM

## 2021-10-11 LAB — CREATINE: Creatine, Serum: 0.1 mg/dL (ref 0.0–0.7)

## 2021-10-11 MED ORDER — APIXABAN 2.5 MG PO TABS: 2.5000 mg | ORAL_TABLET | Freq: Two times a day (BID) | ORAL | 3 refills | Status: DC

## 2021-10-11 NOTE — Telephone Encounter (Signed)
[employer] medicare/[place] medicad NPR sent to [facility]

## 2021-10-12 DIAGNOSIS — C3491 Malignant neoplasm of unspecified part of right bronchus or lung: Secondary | ICD-10-CM | POA: Diagnosis not present

## 2021-10-12 DIAGNOSIS — I252 Old myocardial infarction: Secondary | ICD-10-CM | POA: Diagnosis not present

## 2021-10-12 DIAGNOSIS — I69354 Hemiplegia and hemiparesis following cerebral infarction affecting left non-dominant side: Secondary | ICD-10-CM | POA: Diagnosis not present

## 2021-10-12 DIAGNOSIS — M109 Gout, unspecified: Secondary | ICD-10-CM | POA: Diagnosis not present

## 2021-10-12 DIAGNOSIS — M1990 Unspecified osteoarthritis, unspecified site: Secondary | ICD-10-CM | POA: Diagnosis not present

## 2021-10-12 DIAGNOSIS — Z9181 History of falling: Secondary | ICD-10-CM | POA: Diagnosis not present

## 2021-10-12 DIAGNOSIS — I10 Essential (primary) hypertension: Secondary | ICD-10-CM | POA: Diagnosis not present

## 2021-10-12 DIAGNOSIS — D630 Anemia in neoplastic disease: Secondary | ICD-10-CM | POA: Diagnosis not present

## 2021-10-12 DIAGNOSIS — Z7902 Long term (current) use of antithrombotics/antiplatelets: Secondary | ICD-10-CM | POA: Diagnosis not present

## 2021-10-12 DIAGNOSIS — I2510 Atherosclerotic heart disease of native coronary artery without angina pectoris: Secondary | ICD-10-CM | POA: Diagnosis not present

## 2021-10-12 DIAGNOSIS — E1142 Type 2 diabetes mellitus with diabetic polyneuropathy: Secondary | ICD-10-CM | POA: Diagnosis not present

## 2021-10-12 DIAGNOSIS — Z86718 Personal history of other venous thrombosis and embolism: Secondary | ICD-10-CM | POA: Diagnosis not present

## 2021-10-12 DIAGNOSIS — I69990 Apraxia following unspecified cerebrovascular disease: Secondary | ICD-10-CM | POA: Diagnosis not present

## 2021-10-12 DIAGNOSIS — M21372 Foot drop, left foot: Secondary | ICD-10-CM | POA: Diagnosis not present

## 2021-10-16 ENCOUNTER — Other Ambulatory Visit (HOSPITAL_COMMUNITY): Payer: Self-pay

## 2021-10-18 ENCOUNTER — Other Ambulatory Visit: Payer: Self-pay

## 2021-10-19 ENCOUNTER — Other Ambulatory Visit: Payer: Self-pay

## 2021-10-19 DIAGNOSIS — M109 Gout, unspecified: Secondary | ICD-10-CM | POA: Diagnosis not present

## 2021-10-19 DIAGNOSIS — I2510 Atherosclerotic heart disease of native coronary artery without angina pectoris: Secondary | ICD-10-CM | POA: Diagnosis not present

## 2021-10-19 DIAGNOSIS — C3491 Malignant neoplasm of unspecified part of right bronchus or lung: Secondary | ICD-10-CM | POA: Diagnosis not present

## 2021-10-19 DIAGNOSIS — M1990 Unspecified osteoarthritis, unspecified site: Secondary | ICD-10-CM | POA: Diagnosis not present

## 2021-10-19 DIAGNOSIS — I69990 Apraxia following unspecified cerebrovascular disease: Secondary | ICD-10-CM | POA: Diagnosis not present

## 2021-10-19 DIAGNOSIS — Z7902 Long term (current) use of antithrombotics/antiplatelets: Secondary | ICD-10-CM | POA: Diagnosis not present

## 2021-10-19 DIAGNOSIS — I69354 Hemiplegia and hemiparesis following cerebral infarction affecting left non-dominant side: Secondary | ICD-10-CM | POA: Diagnosis not present

## 2021-10-19 DIAGNOSIS — I252 Old myocardial infarction: Secondary | ICD-10-CM | POA: Diagnosis not present

## 2021-10-19 DIAGNOSIS — Z86718 Personal history of other venous thrombosis and embolism: Secondary | ICD-10-CM | POA: Diagnosis not present

## 2021-10-19 DIAGNOSIS — D630 Anemia in neoplastic disease: Secondary | ICD-10-CM | POA: Diagnosis not present

## 2021-10-19 DIAGNOSIS — Z9181 History of falling: Secondary | ICD-10-CM | POA: Diagnosis not present

## 2021-10-19 DIAGNOSIS — M21372 Foot drop, left foot: Secondary | ICD-10-CM | POA: Diagnosis not present

## 2021-10-19 DIAGNOSIS — I10 Essential (primary) hypertension: Secondary | ICD-10-CM | POA: Diagnosis not present

## 2021-10-19 DIAGNOSIS — E1142 Type 2 diabetes mellitus with diabetic polyneuropathy: Secondary | ICD-10-CM | POA: Diagnosis not present

## 2021-10-23 ENCOUNTER — Inpatient Hospital Stay: Payer: Medicare Other

## 2021-10-23 ENCOUNTER — Inpatient Hospital Stay: Payer: Medicare Other | Admitting: Internal Medicine

## 2021-10-23 ENCOUNTER — Telehealth: Payer: Self-pay

## 2021-10-23 NOTE — Telephone Encounter (Signed)
I have LVM for pts son, Vipul, regarding pt no showing his appts today. I have requested they contact scheduling to reschedule the appts.

## 2021-10-24 ENCOUNTER — Other Ambulatory Visit: Payer: Self-pay

## 2021-10-24 ENCOUNTER — Encounter: Payer: Medicare Other | Admitting: Vascular Surgery

## 2021-10-24 ENCOUNTER — Inpatient Hospital Stay: Payer: Medicare Other

## 2021-10-24 ENCOUNTER — Inpatient Hospital Stay (HOSPITAL_BASED_OUTPATIENT_CLINIC_OR_DEPARTMENT_OTHER): Payer: Medicare Other | Admitting: Internal Medicine

## 2021-10-24 VITALS — BP 172/50 | HR 76 | Temp 98.5°F | Resp 15 | Wt 168.7 lb

## 2021-10-24 DIAGNOSIS — C3491 Malignant neoplasm of unspecified part of right bronchus or lung: Secondary | ICD-10-CM

## 2021-10-24 DIAGNOSIS — Z955 Presence of coronary angioplasty implant and graft: Secondary | ICD-10-CM | POA: Diagnosis not present

## 2021-10-24 DIAGNOSIS — C3490 Malignant neoplasm of unspecified part of unspecified bronchus or lung: Secondary | ICD-10-CM | POA: Diagnosis not present

## 2021-10-24 DIAGNOSIS — Z79899 Other long term (current) drug therapy: Secondary | ICD-10-CM | POA: Diagnosis not present

## 2021-10-24 DIAGNOSIS — Z7901 Long term (current) use of anticoagulants: Secondary | ICD-10-CM | POA: Diagnosis not present

## 2021-10-24 DIAGNOSIS — Z8673 Personal history of transient ischemic attack (TIA), and cerebral infarction without residual deficits: Secondary | ICD-10-CM | POA: Diagnosis not present

## 2021-10-24 DIAGNOSIS — Z86711 Personal history of pulmonary embolism: Secondary | ICD-10-CM | POA: Diagnosis not present

## 2021-10-24 DIAGNOSIS — I82502 Chronic embolism and thrombosis of unspecified deep veins of left lower extremity: Secondary | ICD-10-CM | POA: Diagnosis not present

## 2021-10-24 LAB — CBC WITH DIFFERENTIAL (CANCER CENTER ONLY)
Abs Immature Granulocytes: 0.12 10*3/uL — ABNORMAL HIGH (ref 0.00–0.07)
Basophils Absolute: 0.1 10*3/uL (ref 0.0–0.1)
Basophils Relative: 1 %
Eosinophils Absolute: 0.3 10*3/uL (ref 0.0–0.5)
Eosinophils Relative: 4 %
HCT: 25.4 % — ABNORMAL LOW (ref 39.0–52.0)
Hemoglobin: 8.5 g/dL — ABNORMAL LOW (ref 13.0–17.0)
Immature Granulocytes: 2 %
Lymphocytes Relative: 23 %
Lymphs Abs: 1.9 10*3/uL (ref 0.7–4.0)
MCH: 29 pg (ref 26.0–34.0)
MCHC: 33.5 g/dL (ref 30.0–36.0)
MCV: 86.7 fL (ref 80.0–100.0)
Monocytes Absolute: 0.8 10*3/uL (ref 0.1–1.0)
Monocytes Relative: 9 %
Neutro Abs: 5 10*3/uL (ref 1.7–7.7)
Neutrophils Relative %: 61 %
Platelet Count: 389 10*3/uL (ref 150–400)
RBC: 2.93 MIL/uL — ABNORMAL LOW (ref 4.22–5.81)
RDW: 16 % — ABNORMAL HIGH (ref 11.5–15.5)
WBC Count: 8.2 10*3/uL (ref 4.0–10.5)
nRBC: 0 % (ref 0.0–0.2)

## 2021-10-24 LAB — CMP (CANCER CENTER ONLY)
ALT: 16 U/L (ref 0–44)
AST: 24 U/L (ref 15–41)
Albumin: 3.2 g/dL — ABNORMAL LOW (ref 3.5–5.0)
Alkaline Phosphatase: 95 U/L (ref 38–126)
Anion gap: 5 (ref 5–15)
BUN: 19 mg/dL (ref 8–23)
CO2: 28 mmol/L (ref 22–32)
Calcium: 8.5 mg/dL — ABNORMAL LOW (ref 8.9–10.3)
Chloride: 107 mmol/L (ref 98–111)
Creatinine: 1.32 mg/dL — ABNORMAL HIGH (ref 0.61–1.24)
GFR, Estimated: 54 mL/min — ABNORMAL LOW (ref >60)
Glucose, Bld: 144 mg/dL — ABNORMAL HIGH (ref 70–99)
Potassium: 3.8 mmol/L (ref 3.5–5.1)
Sodium: 140 mmol/L (ref 135–145)
Total Bilirubin: 1.5 mg/dL — ABNORMAL HIGH (ref 0.3–1.2)
Total Protein: 6.4 g/dL — ABNORMAL LOW (ref 6.5–8.1)

## 2021-10-24 LAB — IRON AND IRON BINDING CAPACITY (CC-WL,HP ONLY)
Iron: 68 ug/dL (ref 45–182)
Saturation Ratios: 27 % (ref 17.9–39.5)
TIBC: 255 ug/dL (ref 250–450)
UIBC: 187 ug/dL (ref 117–376)

## 2021-10-24 LAB — LACTATE DEHYDROGENASE: LDH: 287 U/L — ABNORMAL HIGH (ref 98–192)

## 2021-10-24 MED ORDER — FUROSEMIDE 20 MG PO TABS: ORAL_TABLET | ORAL | 0 refills | Status: DC

## 2021-10-24 NOTE — Progress Notes (Signed)
    [facility] [phone]   [phone]  OFFICE PROGRESS NOTE  Chambliss, Marshall L, MD [address] [place] [address]  DIAGNOSIS:  1) Stage IV (T1b, N3, M1 C) non-small cell lung cancer, adenocarcinoma with positive ALK gene translocation diagnosed in February 2023. 2) deep venous thrombosis of the left lower extremity  PRIOR THERAPY: None  CURRENT THERAPY:  1) Alecensa (Alectinib) 600 mg p.o. twice daily.  First dose April 11, 2021.  Status post 5 months of treatment. 2) Eliquis 2.5 mg p.o. twice daily.  First dose 10/11/2021  INTERVAL HISTORY: Arthur [name] 81 y.o. male returns to the clinic today for follow-up visit accompanied by his son.  The patient is feeling fine today with no concerning complaints except for the fatigue.  He also has some episodes of sweats after starting treatment with Eliquis.  But no significant fever or chills.  He continues to have the swelling of the lower extremities but no significant bleeding issues.  He is currently on oral iron tablets with fusion plus because of the anemia.  He has no chest pain, shortness of breath except with exertion with no cough or hemoptysis.  He has no nausea, vomiting, diarrhea or constipation.  He has no headache or visual changes.  He is here today for evaluation and repeat blood work.  MEDICAL HISTORY: Past Medical History:  Diagnosis Date   Diabetes mellitus without complication (HCC)    High cholesterol    Hypertension    lung ca    Normal nuclear stress test 01/29/2008   stress perfusion study apparently in 2010 in [facility] which he said was negative.    ALLERGIES:  is allergic to ace inhibitors and iohexol.  MEDICATIONS:  Current Outpatient Medications  Medication Sig Dispense Refill   alectinib (ALECENSA) 150 MG capsule Take 4 capsules (600 mg total) by mouth 2 (two) times daily with a meal. 240 capsule 3   apixaban (ELIQUIS) 2.5 MG TABS  tablet Take 1 tablet (2.5 mg total) by mouth 2 (two) times daily. 60 tablet 3   clopidogrel (PLAVIX) 75 MG tablet Take 1 tablet (75 mg total) by mouth daily. 90 tablet 1   ferrous sulfate 324 (65 Fe) MG TBEC Take 324 mg by mouth daily. 30 tablet    omeprazole (PRILOSEC) 20 MG capsule Take 20 mg by mouth daily.     rosuvastatin (CRESTOR) 5 MG tablet Take 1 tablet by mouth once daily 90 tablet 0   No current facility-administered medications for this visit.    SURGICAL HISTORY:  Past Surgical History:  Procedure Laterality Date   CATARACT EXTRACTION, BILATERAL  2006   CORONARY STENT INTERVENTION N/A 02/06/2021   Procedure: CORONARY STENT INTERVENTION;  Surgeon: Varanasi, Jayadeep S, MD;  Location: [facility];  Service: Cardiovascular;  Laterality: N/A;   FINE NEEDLE ASPIRATION  03/21/2021   Procedure: FINE NEEDLE ASPIRATION;  Surgeon: Icard, Bradley L, DO;  Location: [facility] ENDOSCOPY;  Service: Pulmonary;;   INTRAVASCULAR ULTRASOUND/IVUS N/A 02/06/2021   Procedure: Intravascular Ultrasound/IVUS;  Surgeon: Varanasi, Jayadeep S, MD;  Location: [facility];  Service: Cardiovascular;  Laterality: N/A;   KNEE SURGERY  2005   LEFT HEART CATH AND CORONARY ANGIOGRAPHY N/A 02/06/2021   Procedure: LEFT HEART CATH AND CORONARY ANGIOGRAPHY;  Surgeon: Varanasi, Jayadeep S, MD;  Location: [facility];  Service: Cardiovascular;  Laterality: N/A;   VIDEO BRONCHOSCOPY WITH ENDOBRONCHIAL ULTRASOUND Bilateral 03/21/2021   Procedure:   VIDEO BRONCHOSCOPY WITH ENDOBRONCHIAL ULTRASOUND;  Surgeon: Icard, Bradley L, DO;  Location: [facility] ENDOS[facility];  Service: Pulmonary;  Laterality: Bilateral;    REVIEW OF SYSTEMS:  Constitutional: positive for fatigue Eyes: negative Ears, nose, mouth, throat, and face: negative Respiratory: positive for dyspnea on exertion Cardiovascular: negative Gastrointestinal: negative Genitourinary:negative Integument/breast: negative Hematologic/lymphatic:  negative Musculoskeletal:positive for muscle weakness Neurological: negative Behavioral/Psych: negative Endocrine: negative Allergic/Immunologic: negative   PHYSICAL EXAMINATION: General appearance: alert, cooperative, fatigued, and no distress Head: Normocephalic, without obvious abnormality, atraumatic Neck: no adenopathy, no JVD, supple, symmetrical, trachea midline, and thyroid not enlarged, symmetric, no tenderness/mass/nodules Lymph nodes: Cervical, supraclavicular, and axillary nodes normal. Resp: clear to auscultation bilaterally Back: symmetric, no curvature. ROM normal. No CVA tenderness. Cardio: regular rate and rhythm, S1, S2 normal, no murmur, click, rub or gallop GI: soft, non-tender; bowel sounds normal; no masses,  no organomegaly Extremities: edema 1+ edema in the left lower extremity Neurologic: Alert and oriented X 3, normal strength and tone. Normal symmetric reflexes. Normal coordination and gait  ECOG PERFORMANCE STATUS: 1 - Symptomatic but completely ambulatory  Blood pressure (!) 172/50, pulse 76, temperature 98.5 F (36.9 C), temperature source Oral, resp. rate 15, weight 168 lb 11.2 oz (76.5 kg), SpO2 94 %.  LABORATORY DATA: Lab Results  Component Value Date   WBC 8.2 10/24/2021   HGB 8.5 (L) 10/24/2021   HCT 25.4 (L) 10/24/2021   MCV 86.7 10/24/2021   PLT 389 10/24/2021      Chemistry      Component Value Date/Time   NA 140 10/24/2021 1001   NA 140 07/17/2021 1103   K 3.8 10/24/2021 1001   CL 107 10/24/2021 1001   CO2 28 10/24/2021 1001   BUN 19 10/24/2021 1001   BUN 21 10/09/2021 1147   CREATININE 1.32 (H) 10/24/2021 1001   CREATININE 1.18 11/15/2015 1114      Component Value Date/Time   CALCIUM 8.5 (L) 10/24/2021 1001   ALKPHOS 95 10/24/2021 1001   AST 24 10/24/2021 1001   ALT 16 10/24/2021 1001   BILITOT 1.5 (H) 10/24/2021 1001       RADIOGRAPHIC STUDIES: VAS US LOWER EXTREMITY VENOUS (DVT)  Result Date: 09/27/2021  Lower Venous  DVT Study Patient Name:  Arthur [name]  Date of Exam:   09/25/2021 Medical Rec #: [id number]          Accession #:    [id number] Date of Birth: 02/18/1940           Patient Gender: M Patient Age:   81 years Exam Location:  [facility] Procedure:      VAS US LOWER EXTREMITY VENOUS (DVT) Referring Phys: BRITTANY MCINTYRE --------------------------------------------------------------------------------  Indications: Patient presents for follow-up DVT study. 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 Patient has an IVC filter placed.  Risk Factors: Lung cancer, history of DVT and PE. Anticoagulation: IVC filter placed. Comparison Study: Previous bilateral venous duplex performed at [facility] on 03/19/21 showed acute DVT in the bilateral                   popliteal, posterior tibial, peroneal and soleal veins. Performing Technologist: Danielle Schmitt RVT  Examination Guidelines: A complete evaluation includes B-mode imaging, spectral Doppler, color Doppler, and power Doppler as needed of all accessible portions of each vessel. Bilateral testing is considered an integral part of a complete examination.   Limited examinations for reoccurring indications may be performed as noted. The reflux portion of the exam is performed with the patient in reverse Trendelenburg.  +-----+---------------+---------+-----------+----------+--------------+ RIGHTCompressibilityPhasicitySpontaneityPropertiesThrombus Aging +-----+---------------+---------+-----------+----------+--------------+ CFV  Full           Yes      Yes                                 +-----+---------------+---------+-----------+----------+--------------+   +---------+---------------+---------+-----------+----------+-------------------+ LEFT     CompressibilityPhasicitySpontaneityPropertiesThrombus Aging      +---------+---------------+---------+-----------+----------+-------------------+ CFV      Full            Yes      Yes                                      +---------+---------------+---------+-----------+----------+-------------------+ SFJ      Full           Yes      Yes                                      +---------+---------------+---------+-----------+----------+-------------------+ FV Prox  Full           Yes      Yes                                      +---------+---------------+---------+-----------+----------+-------------------+ FV Mid   Full           Yes      Yes                                      +---------+---------------+---------+-----------+----------+-------------------+ FV DistalFull           Yes      Yes                                      +---------+---------------+---------+-----------+----------+-------------------+ POP      Full           Yes      Yes                                      +---------+---------------+---------+-----------+----------+-------------------+ PTV      Partial                                                          +---------+---------------+---------+-----------+----------+-------------------+ PERO     Partial                                                          +---------+---------------+---------+-----------+----------+-------------------+ Soleal   None             No       No                   Age                                                                       Indeterminate/conti                                                       nued                +---------+---------------+---------+-----------+----------+-------------------+ Gastroc  Full                                                             +---------+---------------+---------+-----------+----------+-------------------+ GSV      Full           Yes      Yes                                      +---------+---------------+---------+-----------+----------+-------------------+ Difficult to determine aging of  calf vein thrombus due to shadowing from calcified plaque in the arteries and poor visualization. There appears to be partial compressibility in the posterior tibial and peroneal veins on today's study.  Left Technical Findings: Cystic structure noted in the popliteal fossa measuring 3.0 x 1.1 cm.   Summary: RIGHT: - No evidence of common femoral vein obstruction.  LEFT: - Findings consistent with age indeterminate deep vein thrombosis involving the left soleal veins. - Findings consistent with chronic deep vein thrombosis involving the left posterior tibial veins, and left peroneal veins. - A cystic structure is found in the popliteal fossa. - All other veins visualized appear fully compressible and demonstrate appropriate Doppler characteristics.  *See table(s) above for measurements and observations. Electronically signed by Carlyle Dolly MD on 09/27/2021 at 9:51:58 AM.    Final     ASSESSMENT AND PLAN: This is a very pleasant 81 years old [place] male diagnosed with Stage IV (T1b, N3, M1 C) non-small cell lung cancer, adenocarcinoma with positive ALK gene translocation in February 2023. The patient is currently undergoing treatment with Alecensa (Alectinib) 600 mg p.o. twice daily.  He has been tolerating the treatment well with no concerning complaints.  He is status post 6 months of treatment.  This treatment was interrupted for 2 weeks secondary to the venous thrombosis and hospitalization.   He has been tolerating this treatment fairly well with no concerning adverse effects. I recommended for him to continue his current treatment with Alecensa (Alectinib) with the same dose. For the hemolytic anemia, he was treated with a tapered dose of prednisone.  His hemoglobin today is 8.5. The patient will continue on the oral iron tablet with fusion plus. For the history of deep venous thrombosis and pulmonary embolism,  he had IVC filter placed and he was on treatment with Plavix.  The recent Doppler of the  lower extremity showed persistent and chronic deep venous thrombosis involving the left lower extremity. The patient also started treatment with Eliquis 2.5 mg p.o. twice daily 2 weeks ago and he has been tolerating it well except for the increased sweating after starting the treatment which is unexplained. I will see him back for follow-up visit in 2 weeks for evaluation and repeat blood work.  If he continues to have worsening anemia, I may consider him again for treatment with steroids. The patient was advised to call immediately if he has any other concerning symptoms in the interval. The patient voices understanding of current disease status and treatment options and is in agreement with the current care plan.  All questions were answered. The patient knows to call the clinic with any problems, questions or concerns. We can certainly see the patient much sooner if necessary.  The total time spent in the appointment was 30 minutes.  Disclaimer: This note was dictated with voice recognition software. Similar sounding words can inadvertently be transcribed and may not be corrected upon review.

## 2021-10-25 ENCOUNTER — Telehealth: Payer: Self-pay | Admitting: Internal Medicine

## 2021-10-25 DIAGNOSIS — I252 Old myocardial infarction: Secondary | ICD-10-CM | POA: Diagnosis not present

## 2021-10-25 DIAGNOSIS — I69354 Hemiplegia and hemiparesis following cerebral infarction affecting left non-dominant side: Secondary | ICD-10-CM | POA: Diagnosis not present

## 2021-10-25 DIAGNOSIS — M21372 Foot drop, left foot: Secondary | ICD-10-CM | POA: Diagnosis not present

## 2021-10-25 DIAGNOSIS — Z9181 History of falling: Secondary | ICD-10-CM | POA: Diagnosis not present

## 2021-10-25 DIAGNOSIS — I10 Essential (primary) hypertension: Secondary | ICD-10-CM | POA: Diagnosis not present

## 2021-10-25 DIAGNOSIS — E1142 Type 2 diabetes mellitus with diabetic polyneuropathy: Secondary | ICD-10-CM | POA: Diagnosis not present

## 2021-10-25 DIAGNOSIS — I69990 Apraxia following unspecified cerebrovascular disease: Secondary | ICD-10-CM | POA: Diagnosis not present

## 2021-10-25 DIAGNOSIS — Z7902 Long term (current) use of antithrombotics/antiplatelets: Secondary | ICD-10-CM | POA: Diagnosis not present

## 2021-10-25 DIAGNOSIS — C3491 Malignant neoplasm of unspecified part of right bronchus or lung: Secondary | ICD-10-CM | POA: Diagnosis not present

## 2021-10-25 DIAGNOSIS — D630 Anemia in neoplastic disease: Secondary | ICD-10-CM | POA: Diagnosis not present

## 2021-10-25 DIAGNOSIS — M1990 Unspecified osteoarthritis, unspecified site: Secondary | ICD-10-CM | POA: Diagnosis not present

## 2021-10-25 DIAGNOSIS — Z86718 Personal history of other venous thrombosis and embolism: Secondary | ICD-10-CM | POA: Diagnosis not present

## 2021-10-25 DIAGNOSIS — I2510 Atherosclerotic heart disease of native coronary artery without angina pectoris: Secondary | ICD-10-CM | POA: Diagnosis not present

## 2021-10-25 DIAGNOSIS — M109 Gout, unspecified: Secondary | ICD-10-CM | POA: Diagnosis not present

## 2021-10-25 NOTE — Telephone Encounter (Signed)
Talked with patient daughter to confirm 10/11 appointment

## 2021-10-26 ENCOUNTER — Other Ambulatory Visit: Payer: Self-pay

## 2021-10-26 DIAGNOSIS — C3490 Malignant neoplasm of unspecified part of unspecified bronchus or lung: Secondary | ICD-10-CM | POA: Diagnosis not present

## 2021-10-26 DIAGNOSIS — I6340 Cerebral infarction due to embolism of unspecified cerebral artery: Secondary | ICD-10-CM | POA: Diagnosis not present

## 2021-10-28 ENCOUNTER — Ambulatory Visit (HOSPITAL_COMMUNITY)
Admission: RE | Admit: 2021-10-28 | Discharge: 2021-10-28 | Disposition: A | Discharge status: Home / Self Care | Payer: Medicare Other | Source: Ambulatory Visit | Attending: Neurology | Admitting: Neurology

## 2021-10-28 DIAGNOSIS — S32040A Wedge compression fracture of fourth lumbar vertebra, initial encounter for closed fracture: Secondary | ICD-10-CM | POA: Diagnosis not present

## 2021-10-28 DIAGNOSIS — M48062 Spinal stenosis, lumbar region with neurogenic claudication: Secondary | ICD-10-CM

## 2021-10-28 DIAGNOSIS — G992 Myelopathy in diseases classified elsewhere: Secondary | ICD-10-CM | POA: Insufficient documentation

## 2021-10-28 DIAGNOSIS — C7951 Secondary malignant neoplasm of bone: Secondary | ICD-10-CM

## 2021-10-28 DIAGNOSIS — M4804 Spinal stenosis, thoracic region: Secondary | ICD-10-CM | POA: Insufficient documentation

## 2021-10-28 DIAGNOSIS — M21372 Foot drop, left foot: Secondary | ICD-10-CM | POA: Insufficient documentation

## 2021-10-28 DIAGNOSIS — G5702 Lesion of sciatic nerve, left lower limb: Secondary | ICD-10-CM | POA: Insufficient documentation

## 2021-10-28 DIAGNOSIS — S7012XA Contusion of left thigh, initial encounter: Secondary | ICD-10-CM | POA: Diagnosis not present

## 2021-10-28 DIAGNOSIS — S9032XA Contusion of left foot, initial encounter: Secondary | ICD-10-CM | POA: Diagnosis not present

## 2021-10-28 DIAGNOSIS — C3492 Malignant neoplasm of unspecified part of left bronchus or lung: Secondary | ICD-10-CM | POA: Diagnosis not present

## 2021-10-28 DIAGNOSIS — M40204 Unspecified kyphosis, thoracic region: Secondary | ICD-10-CM | POA: Diagnosis not present

## 2021-10-28 DIAGNOSIS — M7981 Nontraumatic hematoma of soft tissue: Secondary | ICD-10-CM | POA: Diagnosis not present

## 2021-10-28 MED ORDER — GADOPICLENOL 0.5 MMOL/ML IV SOLN
7.5000 mL | Freq: Once | INTRAVENOUS | Status: AC | PRN
  Administered 2021-10-28: 7.5 mL via INTRAVENOUS

## 2021-10-29 ENCOUNTER — Other Ambulatory Visit: Payer: Self-pay

## 2021-10-30 ENCOUNTER — Telehealth: Payer: Self-pay | Admitting: Internal Medicine

## 2021-10-30 DIAGNOSIS — H524 Presbyopia: Secondary | ICD-10-CM | POA: Diagnosis not present

## 2021-10-30 NOTE — Telephone Encounter (Signed)
Scheduled per 09/27 los, patient has been called and notified.

## 2021-11-05 ENCOUNTER — Telehealth: Payer: Self-pay | Admitting: Neurology

## 2021-11-05 NOTE — Telephone Encounter (Signed)
Pod 4 please call:  The bleed in your leg/thigh directly put pressure on the sciatic nerve which is why you had the foot drop. At this time the bleed is improving and hopefully so will your foot drop. You also have severe spinal stenosis in your back and a compression fracture at L4; but given your cancer I am not sure any surgeon would perform surgery but we could send you for evaluation of surgery or injections. I will ask my nurse to call you and see what you would like to do thank you.Thoracic spine shows improved metastasis to your spine and spinal cord is normal there.

## 2021-11-05 NOTE — Telephone Encounter (Signed)
I called and spoke to son, Arthur [name].  I relayed the message to him per Dr. Jaynee Eagles.  He states that pt foot drop no better.  He has restarted his blood thinner. He verbalized understanding.   He would speak to oncologist about injections.  He will call us back about referral if they want to proceed.  He appreciated call back.

## 2021-11-06 ENCOUNTER — Inpatient Hospital Stay (HOSPITAL_COMMUNITY)
Admission: EM | Admit: 2021-11-06 | Discharge: 2021-11-10 | DRG: 871 | Disposition: A | Discharge status: Home Health | Payer: Medicare Other | Attending: Family Medicine | Admitting: Family Medicine

## 2021-11-06 ENCOUNTER — Other Ambulatory Visit (HOSPITAL_COMMUNITY): Payer: Self-pay

## 2021-11-06 ENCOUNTER — Other Ambulatory Visit: Payer: Self-pay

## 2021-11-06 ENCOUNTER — Telehealth: Payer: Self-pay | Admitting: Medical Oncology

## 2021-11-06 ENCOUNTER — Observation Stay (HOSPITAL_COMMUNITY): Payer: Medicare Other

## 2021-11-06 ENCOUNTER — Emergency Department (HOSPITAL_COMMUNITY): Payer: Medicare Other

## 2021-11-06 ENCOUNTER — Other Ambulatory Visit: Payer: Self-pay | Admitting: Family Medicine

## 2021-11-06 DIAGNOSIS — Z86711 Personal history of pulmonary embolism: Secondary | ICD-10-CM | POA: Diagnosis not present

## 2021-11-06 DIAGNOSIS — S2232XA Fracture of one rib, left side, initial encounter for closed fracture: Secondary | ICD-10-CM

## 2021-11-06 DIAGNOSIS — Z7901 Long term (current) use of anticoagulants: Secondary | ICD-10-CM

## 2021-11-06 DIAGNOSIS — Z20822 Contact with and (suspected) exposure to covid-19: Secondary | ICD-10-CM | POA: Diagnosis present

## 2021-11-06 DIAGNOSIS — R6520 Severe sepsis without septic shock: Secondary | ICD-10-CM | POA: Diagnosis present

## 2021-11-06 DIAGNOSIS — J439 Emphysema, unspecified: Secondary | ICD-10-CM | POA: Diagnosis not present

## 2021-11-06 DIAGNOSIS — S2242XA Multiple fractures of ribs, left side, initial encounter for closed fracture: Secondary | ICD-10-CM | POA: Diagnosis present

## 2021-11-06 DIAGNOSIS — Z79899 Other long term (current) drug therapy: Secondary | ICD-10-CM | POA: Diagnosis not present

## 2021-11-06 DIAGNOSIS — J9601 Acute respiratory failure with hypoxia: Secondary | ICD-10-CM

## 2021-11-06 DIAGNOSIS — Z955 Presence of coronary angioplasty implant and graft: Secondary | ICD-10-CM

## 2021-11-06 DIAGNOSIS — N1831 Chronic kidney disease, stage 3a: Secondary | ICD-10-CM | POA: Diagnosis present

## 2021-11-06 DIAGNOSIS — Z7189 Other specified counseling: Secondary | ICD-10-CM | POA: Diagnosis not present

## 2021-11-06 DIAGNOSIS — K4090 Unilateral inguinal hernia, without obstruction or gangrene, not specified as recurrent: Secondary | ICD-10-CM | POA: Diagnosis not present

## 2021-11-06 DIAGNOSIS — C7951 Secondary malignant neoplasm of bone: Secondary | ICD-10-CM | POA: Diagnosis not present

## 2021-11-06 DIAGNOSIS — J189 Pneumonia, unspecified organism: Secondary | ICD-10-CM | POA: Diagnosis present

## 2021-11-06 DIAGNOSIS — Z85118 Personal history of other malignant neoplasm of bronchus and lung: Secondary | ICD-10-CM | POA: Diagnosis not present

## 2021-11-06 DIAGNOSIS — Z7902 Long term (current) use of antithrombotics/antiplatelets: Secondary | ICD-10-CM

## 2021-11-06 DIAGNOSIS — R6510 Systemic inflammatory response syndrome (SIRS) of non-infectious origin without acute organ dysfunction: Principal | ICD-10-CM

## 2021-11-06 DIAGNOSIS — X58XXXA Exposure to other specified factors, initial encounter: Secondary | ICD-10-CM | POA: Diagnosis present

## 2021-11-06 DIAGNOSIS — A4151 Sepsis due to Escherichia coli [E. coli]: Secondary | ICD-10-CM | POA: Diagnosis not present

## 2021-11-06 DIAGNOSIS — J90 Pleural effusion, not elsewhere classified: Secondary | ICD-10-CM | POA: Diagnosis not present

## 2021-11-06 DIAGNOSIS — C3490 Malignant neoplasm of unspecified part of unspecified bronchus or lung: Secondary | ICD-10-CM | POA: Diagnosis present

## 2021-11-06 DIAGNOSIS — K8689 Other specified diseases of pancreas: Secondary | ICD-10-CM | POA: Diagnosis not present

## 2021-11-06 DIAGNOSIS — Z8674 Personal history of sudden cardiac arrest: Secondary | ICD-10-CM

## 2021-11-06 DIAGNOSIS — D599 Acquired hemolytic anemia, unspecified: Secondary | ICD-10-CM | POA: Diagnosis not present

## 2021-11-06 DIAGNOSIS — D631 Anemia in chronic kidney disease: Secondary | ICD-10-CM | POA: Diagnosis not present

## 2021-11-06 DIAGNOSIS — A419 Sepsis, unspecified organism: Secondary | ICD-10-CM | POA: Diagnosis not present

## 2021-11-06 DIAGNOSIS — K219 Gastro-esophageal reflux disease without esophagitis: Secondary | ICD-10-CM | POA: Diagnosis not present

## 2021-11-06 DIAGNOSIS — R9431 Abnormal electrocardiogram [ECG] [EKG]: Secondary | ICD-10-CM | POA: Diagnosis not present

## 2021-11-06 DIAGNOSIS — Z86718 Personal history of other venous thrombosis and embolism: Secondary | ICD-10-CM

## 2021-11-06 DIAGNOSIS — M21372 Foot drop, left foot: Secondary | ICD-10-CM | POA: Diagnosis not present

## 2021-11-06 DIAGNOSIS — R0689 Other abnormalities of breathing: Secondary | ICD-10-CM | POA: Diagnosis not present

## 2021-11-06 DIAGNOSIS — Z9221 Personal history of antineoplastic chemotherapy: Secondary | ICD-10-CM

## 2021-11-06 DIAGNOSIS — E1122 Type 2 diabetes mellitus with diabetic chronic kidney disease: Secondary | ICD-10-CM | POA: Diagnosis present

## 2021-11-06 DIAGNOSIS — E611 Iron deficiency: Secondary | ICD-10-CM | POA: Diagnosis present

## 2021-11-06 DIAGNOSIS — D649 Anemia, unspecified: Secondary | ICD-10-CM | POA: Diagnosis not present

## 2021-11-06 DIAGNOSIS — I252 Old myocardial infarction: Secondary | ICD-10-CM

## 2021-11-06 DIAGNOSIS — C3491 Malignant neoplasm of unspecified part of right bronchus or lung: Secondary | ICD-10-CM | POA: Diagnosis not present

## 2021-11-06 DIAGNOSIS — I9589 Other hypotension: Secondary | ICD-10-CM | POA: Diagnosis present

## 2021-11-06 DIAGNOSIS — R062 Wheezing: Secondary | ICD-10-CM | POA: Diagnosis not present

## 2021-11-06 DIAGNOSIS — Z743 Need for continuous supervision: Secondary | ICD-10-CM | POA: Diagnosis not present

## 2021-11-06 DIAGNOSIS — R6889 Other general symptoms and signs: Secondary | ICD-10-CM | POA: Diagnosis not present

## 2021-11-06 DIAGNOSIS — E782 Mixed hyperlipidemia: Secondary | ICD-10-CM | POA: Diagnosis not present

## 2021-11-06 DIAGNOSIS — K8020 Calculus of gallbladder without cholecystitis without obstruction: Secondary | ICD-10-CM | POA: Diagnosis present

## 2021-11-06 DIAGNOSIS — N200 Calculus of kidney: Secondary | ICD-10-CM | POA: Diagnosis not present

## 2021-11-06 DIAGNOSIS — I499 Cardiac arrhythmia, unspecified: Secondary | ICD-10-CM | POA: Diagnosis not present

## 2021-11-06 DIAGNOSIS — R0902 Hypoxemia: Secondary | ICD-10-CM

## 2021-11-06 DIAGNOSIS — D500 Iron deficiency anemia secondary to blood loss (chronic): Secondary | ICD-10-CM

## 2021-11-06 DIAGNOSIS — I2510 Atherosclerotic heart disease of native coronary artery without angina pectoris: Secondary | ICD-10-CM | POA: Diagnosis present

## 2021-11-06 DIAGNOSIS — D589 Hereditary hemolytic anemia, unspecified: Secondary | ICD-10-CM | POA: Diagnosis present

## 2021-11-06 DIAGNOSIS — R509 Fever, unspecified: Secondary | ICD-10-CM | POA: Diagnosis not present

## 2021-11-06 DIAGNOSIS — I700 Atherosclerosis of aorta: Secondary | ICD-10-CM | POA: Diagnosis not present

## 2021-11-06 DIAGNOSIS — E785 Hyperlipidemia, unspecified: Secondary | ICD-10-CM | POA: Diagnosis present

## 2021-11-06 LAB — URINALYSIS, ROUTINE W REFLEX MICROSCOPIC
Bilirubin Urine: NEGATIVE
Glucose, UA: NEGATIVE mg/dL
Hgb urine dipstick: NEGATIVE
Ketones, ur: 5 mg/dL — AB
Leukocytes,Ua: NEGATIVE
Nitrite: NEGATIVE
Protein, ur: NEGATIVE mg/dL
Specific Gravity, Urine: 1.011 (ref 1.005–1.030)
pH: 6 (ref 5.0–8.0)

## 2021-11-06 LAB — CBC WITH DIFFERENTIAL/PLATELET
Abs Immature Granulocytes: 0.13 10*3/uL — ABNORMAL HIGH (ref 0.00–0.07)
Basophils Absolute: 0.1 10*3/uL (ref 0.0–0.1)
Basophils Relative: 0 %
Eosinophils Absolute: 0.1 10*3/uL (ref 0.0–0.5)
Eosinophils Relative: 1 %
HCT: 24.7 % — ABNORMAL LOW (ref 39.0–52.0)
Hemoglobin: 8.4 g/dL — ABNORMAL LOW (ref 13.0–17.0)
Immature Granulocytes: 1 %
Lymphocytes Relative: 9 %
Lymphs Abs: 1.5 10*3/uL (ref 0.7–4.0)
MCH: 28.9 pg (ref 26.0–34.0)
MCHC: 34 g/dL (ref 30.0–36.0)
MCV: 84.9 fL (ref 80.0–100.0)
Monocytes Absolute: 0.3 10*3/uL (ref 0.1–1.0)
Monocytes Relative: 2 %
Neutro Abs: 15.2 10*3/uL — ABNORMAL HIGH (ref 1.7–7.7)
Neutrophils Relative %: 87 %
Platelets: 396 10*3/uL (ref 150–400)
RBC: 2.91 MIL/uL — ABNORMAL LOW (ref 4.22–5.81)
RDW: 15.8 % — ABNORMAL HIGH (ref 11.5–15.5)
WBC: 17.3 10*3/uL — ABNORMAL HIGH (ref 4.0–10.5)
nRBC: 0 % (ref 0.0–0.2)

## 2021-11-06 LAB — COMPREHENSIVE METABOLIC PANEL
ALT: 19 U/L (ref 0–44)
AST: 36 U/L (ref 15–41)
Albumin: 2.7 g/dL — ABNORMAL LOW (ref 3.5–5.0)
Alkaline Phosphatase: 96 U/L (ref 38–126)
Anion gap: 11 (ref 5–15)
BUN: 18 mg/dL (ref 8–23)
CO2: 23 mmol/L (ref 22–32)
Calcium: 8.7 mg/dL — ABNORMAL LOW (ref 8.9–10.3)
Chloride: 104 mmol/L (ref 98–111)
Creatinine, Ser: 1.45 mg/dL — ABNORMAL HIGH (ref 0.61–1.24)
GFR, Estimated: 48 mL/min — ABNORMAL LOW (ref >60)
Glucose, Bld: 134 mg/dL — ABNORMAL HIGH (ref 70–99)
Potassium: 3.9 mmol/L (ref 3.5–5.1)
Sodium: 138 mmol/L (ref 135–145)
Total Bilirubin: 2.6 mg/dL — ABNORMAL HIGH (ref 0.3–1.2)
Total Protein: 6.1 g/dL — ABNORMAL LOW (ref 6.5–8.1)

## 2021-11-06 LAB — PROTIME-INR
INR: 1.4 — ABNORMAL HIGH (ref 0.8–1.2)
Prothrombin Time: 16.8 seconds — ABNORMAL HIGH (ref 11.4–15.2)

## 2021-11-06 LAB — RESP PANEL BY RT-PCR (FLU A&B, COVID) ARPGX2
Influenza A by PCR: NEGATIVE
Influenza B by PCR: NEGATIVE
SARS Coronavirus 2 by RT PCR: NEGATIVE

## 2021-11-06 LAB — LACTIC ACID, PLASMA
Lactic Acid, Venous: 1 mmol/L (ref 0.5–1.9)
Lactic Acid, Venous: 1.1 mmol/L (ref 0.5–1.9)

## 2021-11-06 MED ORDER — LACTATED RINGERS IV BOLUS (SEPSIS)
1000.0000 mL | Freq: Once | INTRAVENOUS | Status: AC
  Administered 2021-11-06: 1000 mL via INTRAVENOUS

## 2021-11-06 MED ORDER — FE FUMARATE-B12-VIT C-FA-IFC PO CAPS
1.0000 | ORAL_CAPSULE | Freq: Every day | ORAL | Status: DC
  Filled 2021-11-06: qty 1

## 2021-11-06 MED ORDER — ACETAMINOPHEN 325 MG PO TABS
650.0000 mg | ORAL_TABLET | Freq: Once | ORAL | Status: AC
  Administered 2021-11-06: 650 mg via ORAL
  Filled 2021-11-06: qty 2

## 2021-11-06 MED ORDER — VANCOMYCIN HCL IN DEXTROSE 1-5 GM/200ML-% IV SOLN: 1000.0000 mg | INTRAVENOUS | Status: DC

## 2021-11-06 MED ORDER — SODIUM CHLORIDE 0.9% FLUSH
3.0000 mL | Freq: Two times a day (BID) | INTRAVENOUS | Status: DC
  Administered 2021-11-06 – 2021-11-09 (×7): 3 mL via INTRAVENOUS

## 2021-11-06 MED ORDER — LACTATED RINGERS IV BOLUS
500.0000 mL | Freq: Once | INTRAVENOUS | Status: AC
  Administered 2021-11-06: 500 mL via INTRAVENOUS

## 2021-11-06 MED ORDER — ACETAMINOPHEN 650 MG RE SUPP: 650.0000 mg | Freq: Four times a day (QID) | RECTAL | Status: DC | PRN

## 2021-11-06 MED ORDER — APIXABAN 2.5 MG PO TABS
2.5000 mg | ORAL_TABLET | Freq: Two times a day (BID) | ORAL | Status: DC
  Administered 2021-11-07 – 2021-11-08 (×3): 2.5 mg via ORAL
  Filled 2021-11-06 (×5): qty 1

## 2021-11-06 MED ORDER — ACETAMINOPHEN 325 MG PO TABS
650.0000 mg | ORAL_TABLET | Freq: Four times a day (QID) | ORAL | Status: DC | PRN
  Administered 2021-11-07: 650 mg via ORAL
  Filled 2021-11-06: qty 2

## 2021-11-06 MED ORDER — PANTOPRAZOLE SODIUM 40 MG PO TBEC: 40.0000 mg | DELAYED_RELEASE_TABLET | Freq: Every day | ORAL | Status: DC | PRN

## 2021-11-06 MED ORDER — LACTATED RINGERS IV BOLUS (SEPSIS)
500.0000 mL | Freq: Once | INTRAVENOUS | Status: AC
  Administered 2021-11-06: 500 mL via INTRAVENOUS

## 2021-11-06 MED ORDER — LACTATED RINGERS IV SOLN: INTRAVENOUS | Status: AC

## 2021-11-06 MED ORDER — ALECTINIB HCL 150 MG PO CAPS
600.0000 mg | ORAL_CAPSULE | Freq: Two times a day (BID) | ORAL | Status: DC
  Administered 2021-11-07 – 2021-11-10 (×7): 600 mg via ORAL
  Filled 2021-11-06 (×6): qty 4

## 2021-11-06 MED ORDER — SODIUM CHLORIDE 0.9 % IV SOLN: 2.0000 g | Freq: Two times a day (BID) | INTRAVENOUS | Status: DC

## 2021-11-06 MED ORDER — VANCOMYCIN HCL 750 MG/150ML IV SOLN
750.0000 mg | Freq: Once | INTRAVENOUS | Status: AC
  Administered 2021-11-06: 750 mg via INTRAVENOUS
  Filled 2021-11-06: qty 150

## 2021-11-06 MED ORDER — VANCOMYCIN HCL IN DEXTROSE 1-5 GM/200ML-% IV SOLN
1000.0000 mg | Freq: Once | INTRAVENOUS | Status: AC
  Administered 2021-11-06: 1000 mg via INTRAVENOUS
  Filled 2021-11-06: qty 200

## 2021-11-06 MED ORDER — METRONIDAZOLE 500 MG/100ML IV SOLN
500.0000 mg | Freq: Once | INTRAVENOUS | Status: AC
  Administered 2021-11-06: 500 mg via INTRAVENOUS
  Filled 2021-11-06: qty 100

## 2021-11-06 MED ORDER — LACTATED RINGERS IV BOLUS: 1000.0000 mL | Freq: Once | INTRAVENOUS | Status: DC

## 2021-11-06 MED ORDER — ROSUVASTATIN CALCIUM 5 MG PO TABS
5.0000 mg | ORAL_TABLET | Freq: Every day | ORAL | Status: DC
  Administered 2021-11-07 – 2021-11-10 (×4): 5 mg via ORAL
  Filled 2021-11-06 (×4): qty 1

## 2021-11-06 MED ORDER — CLOPIDOGREL BISULFATE 75 MG PO TABS
75.0000 mg | ORAL_TABLET | Freq: Every day | ORAL | Status: DC
  Administered 2021-11-07 – 2021-11-10 (×4): 75 mg via ORAL
  Filled 2021-11-06 (×4): qty 1

## 2021-11-06 MED ORDER — SODIUM CHLORIDE 0.9 % IV SOLN
2.0000 g | Freq: Once | INTRAVENOUS | Status: AC
  Administered 2021-11-06: 2 g via INTRAVENOUS
  Filled 2021-11-06: qty 12.5

## 2021-11-06 MED ORDER — FUROSEMIDE 20 MG PO TABS: 20.0000 mg | ORAL_TABLET | Freq: Every day | ORAL | Status: DC | PRN

## 2021-11-06 NOTE — Telephone Encounter (Signed)
Son called -Pt in  ED -sudden onset fever ,chills , weakness.  Appt r/s .

## 2021-11-06 NOTE — ED Triage Notes (Signed)
Pt BIB EMS from home sudden onset of fever, chills and weakness today. No known source of infection. Denies any pain. Denies any urinary complaints.  Albuterol given en route

## 2021-11-06 NOTE — ED Triage Notes (Signed)
Pt had tylenol 2.5 hours ago

## 2021-11-06 NOTE — H&P (Addendum)
Hospital Admission History and Physical Service Pager: [phone]  Patient name: Arthur [name] Medical record number: [id number] Date of Birth: 12-Aug-1940 Age: 81 y.o. Gender: male  Primary Care Provider: Carney Living, MD Consultants: None Code Status: FULL CODE  Preferred Emergency Contact:  Contact Information     Name Relation Home Work Mobile   Gasser,Vipul Son   [phone]   Coatney,[name] Daughter   [phone]   Beiser,Shital Daughter   [phone]      Chief Complaint: Chills   Assessment and Plan: Arthur [name] is a 81 y.o. male presenting with chills and sweats x1 day, cough x3 days, with fever, hypotension, and tachycardia on admission concerning for sepsis of unknown etiology. Significant PMHx including stage IV metastatic lung cancer, prior PE, DVT, MI requiring CPR. Differential diagnoses and management as below.  * Sepsis (HCC) P/w chills and sweats x1 day, cough x3 days. Meets SIRS/sepsis (unknown etiology) criteria given his fever, tachycardia, tachypnea, leukocytosis.  Also intermittently hypotensive to 90s systolic. Has new O2 requirement. CXR and lactate reassuringly unremarkable. CT Chest without findings of acute pneumonia but with small b/l pleural effusions and evidence of his known malignancy. Given no evidence of pneumonia on imaging, as well as negative flu/covid, unclear etiology of his symptoms. Was started on broad spectrum antibiotics in the ED and received total of 2.5L LR boluses and started on mIVF. Given his significant medical history including lung cancer, PE, DVT, MI, cardiac arrest, and new oxygen requirement, feel that he would benefit from admission and further workup. -Admit to med telemetry, attending Dr. Manson Passey -Cardiac monitoring x24hr -Continuous pulse oximetry  -Supplemental O2, wean as tolerated -Continue vancomycin, cefepime, s/p flagyl x1 dose -f/u blood cx, narrow abx as appropriate -prn Tylenol for pain/fever -mIVF LR  at 136mL/hr -AM CBC, BMP  Hypoxia Presents with new oxygen requirement in the setting of possible infection.  However, has a significant medical history including lung cancer, history of PE, history of DVT with current clinical signs of DVT (lower extremity edema, calf tenderness).  Is on Eliquis but this was reportedly restarted about 1 week ago.  Wells score is 7 given his history and clinical signs including tachycardia, feel that we should rule out PE. DDx may also include CHF (lower extremity pitting edema; but no pulmonary edema on CXR).  -Supplemental O2 and pulse oximetry as above -V/Q scan to evaluate for PE (Hx of acute renal failure 2/2 contrast induced ATN. Cr previously as high as 6.13 in previous admission in May) -Continue home eliquis (he is on reduced dose of likely due to previous elevated Cr and his age, will continue this for now as Cr is borderline), plavix -Hold home Lasix -BNP -AM TSH -Ambulate with pulse ox to evaluate for home O2 need prior to discharge  Normocytic anemia Last seen by Heme/Onc, Dr. Arbutus Ped, on 9/27 and Hgb at that time 8.5. Has hx of hemolytic anemia for which he was previously treated with tapered dose of prednisone. Pt had heme/onc appointment on day of admission and was recommended was for repeat blood work and if worsening anemia present there was consideration for repeat treatment with steroids. Given hgb is stable, will defer steroids.  -Monitor CBC -consider repeat LDH, haptoglobin, iron studies if worsening anemia -Continue home iron supplement  Pleural effusion Small b/l pleural effusions seen on CT imaging of chest. Appears minimally increased in size from prior CT chest.  -Monitor O2 requirement, if worsens consider diuresis  Left rib  fracture Appear to be chronic. Also seen on CXR from May. No recent trauma to chest. Denies any pain to chest or pain with inspiration.  -Monitor   Leg swelling Bilateral lower leg swelling. Per patient  and daughter report, it is stable and hasn't worsened. He takes PRN lasix for the swelling at home.  -B/l Vas U/S given hx of DVT  -If U/S neg, consider Ted hose for possible venous insufficiency -Hold Lasix 2/2 soft BP  Foot drop, left foot Ongoing and has seen both PCP and Neurology for this. Recently underwent MRI imaging of thoracic, lumbar, and left femur. 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 On most recent MRI 10/1 it appears this is significantly reduced.   -PT/OT eval and treat  Adenocarcinoma of right lung, stage 4 (HCC) Chronic, no prior home O2 requirement. On chemotherapy with alectinib. CT c/a/p with evidence of metastatic disease but minimal interval changes from prior imaging. -Continue alectinib -PT/OT -If worsening clinically, consider oncology consult   History of cardiac arrest In January of this year. Presumed to be Vtach/PEA arrest. ROSC achieved after 5 minutes. ?if rib fractures still present from CPR.  -Desires full code -Continuous tele -Optimize electrolytes   Status post coronary artery stent placement PCI with DES to LAD due to 80% stenosis of LAD in January. Continue statin and plavix.  Hyperlipidemia Last lipid panel in 01/2021, LDL 71. On Crestor 5 mg. -Continue crestor    FEN/GI: vegetarian diet, protonix prn VTE Prophylaxis: home eliquis  Disposition: med tele  History of Present Illness:  Arthur [name] is a 81 y.o. male presenting with chills and cough  Reports that he started feeling really hot outside today. He then got cold chills and had to cover himself in blankets. He was shivering. He was using Vicks vapor rub as well. Then he started to sweat. His symptoms started again after an hour and so he called 911. He reports feeling very cold. He reports that since receiving the IV he has started to feel better.   He has been coughing for the last 2-3 days and taking Robitussin for this.   His  daughter also notes that he has been having leg swelling. His Nephrologist prescribed Lasix PRN. 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   Denies any SOB, chest pain, home oxygen use. Denies any pain except for in his legs. Has been eating and drinking well. Daughter notes that he had a period of significant weight loss but he has been gaining some weight lately.   Daughter notes that he had rib fractures from cardiac arrest in January. Recently restarted Eliquis about 1-2 weeks ago. He is on reduced dose of Eliquis. Reports that most of his medications were stopped in January.   In the ED, received 2.5L bolus, started on mIVF, received vancomycin, cefepime, flagyl  Review Of Systems: Per HPI  Pertinent Past Medical History: Lung cancer with metastasis  Hx DVT, PE s/p IVC filter Hx cardiac arrest Hx CVA Remainder reviewed in history tab.   Pertinent Past Surgical History: LHC and coronary angiography with coronary stent 01/2021 Cataract extraction b/l 01/2014 Remainder reviewed in history tab.  Pertinent Social History: Tobacco use: No  Alcohol use: No  Other Substance use: No Lives with his wife and son  Was working up to 3 months ago. Was there for 30 years.   Pertinent Family History: CAD in both parents. Father passed from an  MI in his 30s   Remainder reviewed in history tab.   Important Outpatient Medications: Iron-FA-B Cmp-C Biot-Prebiotic  Eliquis 2.5 mg BID  Alectinib 4 capsules BID  Omeprazole PRN  Lasix 20 mg PRN for leg swelling  Plavix 75 mg daily  Remainder reviewed in medication history.   Objective: BP (!) 98/43   Pulse 62   Temp 98.4 F (36.9 C) (Oral)   Resp 11   Wt 76.5 kg   SpO2 98%   BMI 23.52 kg/m  Exam: General: NAD, occasional productive cough (yellow mucus) Cardiovascular: Tachycardic 120s, regular rhythm, no murmurs Respiratory: CTAB, breathing comfortably on 2L supplemental O2 Gastrointestinal: soft,  NT/ND, +BS MSK: 2+ b/l lower extremity pitting edema, + b/l calf tenderness to squeeze. 5/5 strength b/l upper extremities. Unable to move left toes/foot.  Neuro: No focal deficits noted, decreased sensation to left foot compared to right. Speech is clear. Able to follow commands.   Labs:  CBC BMET  Recent Labs  Lab 11/06/21 1530  WBC 17.3*  HGB 8.4*  HCT 24.7*  PLT 396   Recent Labs  Lab 11/06/21 1530  NA 138  K 3.9  CL 104  CO2 23  BUN 18  CREATININE 1.45*  GLUCOSE 134*  CALCIUM 8.7*     Lactate 1.0 PT/INR 16.8/1.4 Flu/covid neg UA 5 ketones   Imaging Studies Performed:  CXR IMPRESSION: 1. No acute cardiopulmonary abnormality. 2. Multiple left rib fractures. Chronic blunting left costophrenic angle.  CT C/A/P IMPRESSION: 1. Small bilateral pleural effusions have minimally increased in size from prior chest CT. 2. Mild body wall edema. Otherwise no acute findings in the chest, abdomen, or pelvis. 3. Right pleural thickening is again seen. Extrapleural low-density in the right hemithorax is diminished from prior chest CT, and was not hypermetabolic on prior PET. Right lower lobe pulmonary nodule is unchanged allowing for differences in caliper placement. Attention to this at follow-up recommended. 4. Similar interstitial thickening in the mid lower lung zones, may represent chronic edema. 5. Widespread bony metastatic disease, grossly stable from prior imaging. Mild L4 inferior endplate compression fracture was present on 10/28/2021 lumbar MRI. 6. Right inguinal hernia containing small bowel loops, not entirely included in the field of view. No evidence of obstruction or inflammation. 7. Nonobstructing right nephrolithiasis. 8. Incidental note of gallstones or sludge in the dependent gallbladder without evidence of cholecystitis.   Aortic Atherosclerosis (ICD10-I70.0) and Emphysema (ICD10-J43.9).   Vonna Drafts, MD 11/07/2021, 12:18 AM PGY-1, [facility]  FPTS Intern pager: [phone], text pages welcome Secure chat group [facility] Teaching Service    FPTS Upper-Level Resident Addendum   I have independently interviewed and examined the patient. I have discussed the above with the original author and agree with their documentation. My edits for correction/addition/clarification are included where appropriate. Please see also any attending notes.   Sabino Dick, DO PGY-3, [facility] 11/07/2021 1:24 AM  FPTS Service pager: [phone] (text pages welcome through [facility])

## 2021-11-06 NOTE — ED Notes (Signed)
Patient transported to X-ray 

## 2021-11-06 NOTE — Progress Notes (Signed)
Elink following code sepsis °

## 2021-11-06 NOTE — ED Provider Notes (Signed)
[facility] EMERGENCY DEPARTMENT Provider Note   CSN: [id number] Arrival date & time: 11/06/21  1501     History  Chief Complaint  Patient presents with   Weakness   Fever    Arthur [name] is a 81 y.o. male.   Weakness Associated symptoms: fever   Fever Associated symptoms: chills   Patient presents for fevers, chills, and generalized weakness.  Medical history includes DM 2, HLD, gout, HTN, GERD, anemia, prior PE, current DVT, CAD, stage IV lung CA.  Onset of symptoms was last night.  For his cancer, he is on alectinib.  He does take Eliquis.  Per family, patient was in his normal state of health earlier in the day yesterday.  He began to appear unwell yesterday evening.  He seems worse today.  He was noted to have subjective fever and was given Tylenol (325 mg) at noon.  He does not wear supplemental oxygen at home.  He does monitor his SPO2 and he was found to be in the high 80s on room air earlier today.  When EMS arrived at home, he was 84% on room air.  He has since been on supplemental oxygen.  He denies shortness of breath.  Patient describes an episode of chills today.  He denies any other recent symptoms.  Currently, he denies any areas of discomfort.  Patient has had a prior episode of cardiac arrest and required CPR.  He states that if his heart stops again, he would want CPR.  Patient is a full code.     Home Medications Prior to Admission medications   Medication Sig Start Date End Date Taking? Authorizing Provider  acetaminophen (TYLENOL) 325 MG suppository Place 325 mg rectally every 4 (four) hours as needed for mild pain or moderate pain.   Yes [provider]  alectinib (ALECENSA) 150 MG capsule Take 4 capsules (600 mg total) by mouth 2 (two) times daily with a meal. 08/16/21  Yes Curt Bears, MD  apixaban (ELIQUIS) 2.5 MG TABS tablet Take 1 tablet (2.5 mg total) by mouth 2 (two) times daily. 10/11/21  Yes Curt Bears, MD   clopidogrel (PLAVIX) 75 MG tablet Take 1 tablet (75 mg total) by mouth daily. 04/04/21  Yes Chambliss, Jeb Levering, MD  furosemide (LASIX) 20 MG tablet 1 tablet p.o. daily on as-needed basis for swelling of the lower extremities. 10/24/21  Yes Curt Bears, MD  Iron-FA-B Cmp-C-Biot-Probiotic (FUSION PLUS) CAPS Take 1 capsule by mouth daily. 09/13/21  Yes [provider]  omeprazole (PRILOSEC) 20 MG capsule TAKE 1 CAPSULE BY MOUTH ONCE DAILY AS NEEDED Patient taking differently: Take 20 mg by mouth as needed (for acid reflux). 11/06/21  Yes Lind Covert, MD  rosuvastatin (CRESTOR) 5 MG tablet Take 1 tablet by mouth once daily 08/16/21  Yes Chambliss, Jeb Levering, MD  ceFAZolin (ANCEF) 10 g injection  07/12/21   [provider]      Allergies    Ace inhibitors and Iohexol    Review of Systems   Review of Systems  Constitutional:  Positive for chills, diaphoresis, fatigue and fever.  Neurological:  Positive for weakness (Generalized).    Physical Exam Updated Vital Signs BP (!) 94/40   Pulse 74   Temp 98.4 F (36.9 C) (Oral)   Resp 13   Wt 76.5 kg   SpO2 98%   BMI 23.52 kg/m  Physical Exam Vitals and nursing note reviewed.  Constitutional:      General: He  is not in acute distress.    Appearance: He is well-developed. He is ill-appearing, toxic-appearing and diaphoretic.  HENT:     Head: Normocephalic and atraumatic.     Right Ear: External ear normal.     Left Ear: External ear normal.     Nose: Nose normal.     Mouth/Throat:     Mouth: Mucous membranes are moist.     Pharynx: Oropharynx is clear.  Eyes:     Extraocular Movements: Extraocular movements intact.     Conjunctiva/sclera: Conjunctivae normal.  Cardiovascular:     Rate and Rhythm: Regular rhythm. Tachycardia present.     Heart sounds: No murmur heard. Pulmonary:     Effort: Pulmonary effort is normal. Tachypnea present. No accessory muscle usage or respiratory distress.     Breath  sounds: Examination of the left-lower field reveals decreased breath sounds. Decreased breath sounds present. No wheezing or rales.  Abdominal:     General: There is no distension.     Palpations: Abdomen is soft.     Tenderness: There is no abdominal tenderness.  Musculoskeletal:        General: No deformity.     Cervical back: Normal range of motion and neck supple.     Right lower leg: Edema present.     Left lower leg: Edema present.  Skin:    General: Skin is warm.     Capillary Refill: Capillary refill takes less than 2 seconds.  Neurological:     General: No focal deficit present.     Mental Status: He is alert and oriented to person, place, and time.     Cranial Nerves: No cranial nerve deficit.     Sensory: No sensory deficit.     Motor: No weakness.     Coordination: Coordination normal.  Psychiatric:        Mood and Affect: Mood normal.        Behavior: Behavior normal.        Thought Content: Thought content normal.        Judgment: Judgment normal.     ED Results / Procedures / Treatments   Labs (all labs ordered are listed, but only abnormal results are displayed) Labs Reviewed  COMPREHENSIVE METABOLIC PANEL - Abnormal; Notable for the following components:      Result Value   Glucose, Bld 134 (*)    Creatinine, Ser 1.45 (*)    Calcium 8.7 (*)    Total Protein 6.1 (*)    Albumin 2.7 (*)    Total Bilirubin 2.6 (*)    GFR, Estimated 48 (*)    All other components within normal limits  CBC WITH DIFFERENTIAL/PLATELET - Abnormal; Notable for the following components:   WBC 17.3 (*)    RBC 2.91 (*)    Hemoglobin 8.4 (*)    HCT 24.7 (*)    RDW 15.8 (*)    Neutro Abs 15.2 (*)    Abs Immature Granulocytes 0.13 (*)    All other components within normal limits  PROTIME-INR - Abnormal; Notable for the following components:   Prothrombin Time 16.8 (*)    INR 1.4 (*)    All other components within normal limits  URINALYSIS, ROUTINE W REFLEX MICROSCOPIC -  Abnormal; Notable for the following components:   Ketones, ur 5 (*)    All other components within normal limits  RESP PANEL BY RT-PCR (FLU A&B, COVID) ARPGX2  CULTURE, BLOOD (ROUTINE X 2)  CULTURE, BLOOD (ROUTINE X 2)  LACTIC ACID, PLASMA  LACTIC ACID, PLASMA  BRAIN NATRIURETIC PEPTIDE  BASIC METABOLIC PANEL  CBC  TSH    EKG None  Radiology CT CHEST ABDOMEN PELVIS WO CONTRAST  Result Date: 11/06/2021 CLINICAL DATA:  Sepsis. Fever and weakness. Radiologic records indicates non-small cell lung cancer. EXAM: CT CHEST, ABDOMEN AND PELVIS WITHOUT CONTRAST TECHNIQUE: Multidetector CT imaging of the chest, abdomen and pelvis was performed following the standard protocol without IV contrast. RADIATION DOSE REDUCTION: This exam was performed according to the departmental dose-optimization program which includes automated exposure control, adjustment of the mA and/or kV according to patient size and/or use of iterative reconstruction technique. COMPARISON:  Chest radiograph earlier today. PET CT 10/14/2021. Chest abdomen pelvis CT 08/02/2021 FINDINGS: CT CHEST FINDINGS Cardiovascular: Minimal aortic atherosclerosis and tortuosity. No aneurysm. Mild cardiomegaly. Trace pericardial effusion. There are coronary artery calcifications Mediastinum/Nodes: Breathing motion artifact limits assessment. Few scattered mediastinal lymph nodes with increased density are stable or slightly diminished in size from prior exam. No axillary adenopathy. Hilar assessment is limited on this unenhanced exam. No esophageal wall thickening. Lungs/Pleura: Small bilateral pleural effusions have minimally increased in size from prior chest CT. Minimal fluid in the right minor fissure. Right lower lobe nodule measures 9 x 6 mm, series 4, image 87, previously 7 x 7 mm. Size differences are likely due to differences in caliper placement. Low-density extrapleural structure posteriorly abutting right ribs spans 2.4 x 1 cm series 3,  image 34, previously 3.4 x 2 cm. The measures fluid density and may represent loculated pleural fluid. This was not hypermetabolic on intervening PET. Right apical pleural thickening measuring up to 9 mm, unchanged allowing for differences in caliper placement. Chronic interstitial thickening in the mid lower chest is unchanged. Mild emphysema with chronic bronchial thickening. No acute airspace disease or pneumonia. Musculoskeletal: Widespread bony metastatic disease with numerous sclerotic foci throughout the ribs and thoracic spine. There is no evidence of acute pathologic fracture. This was recently assessed with thoracic spine MRI CT ABDOMEN PELVIS FINDINGS Hepatobiliary: No evidence of focal lesion on this unenhanced exam. Small stones or sludge in the dependent gallbladder, no pericholecystic inflammation. Pancreas: Motion artifact limits assessment, no evidence of pancreatic inflammation. Unchanged coarse calcification measuring 17 mm in the pancreatic head. Chronic pancreatic ductal dilatation which is obscured due to motion. Spleen: Normal in size without focal abnormality. Adrenals/Urinary Tract: No adrenal nodule. Nonobstructing stone in the right kidney. No hydronephrosis. Motion artifact limits detailed renal assessment. No obvious renal inflammation. There is a small right-sided bladder diverticulum. Urinary bladder is otherwise unremarkable. Stomach/Bowel: Right inguinal hernia containing small bowel loops is not entirely included in the field of view. There is no evidence of associated obstruction or inflammation. The stomach is decompressed and not well assessed. Detailed bowel assessment is limited due to motion and lack contrast. Moderate volume of colonic stool. Vascular/Lymphatic: Aortic atherosclerosis without aneurysm. IVC filter in place. There is no bulky abdominopelvic adenopathy. Reproductive: Unremarkable prostate. Other: Mild generalized body wall edema. No ascites. Moderate-sized right  inguinal hernia containing small bowel, not entirely included in the field of view. No free intra-abdominal air. Musculoskeletal: Widespread bony metastatic disease, stable from prior imaging. L4 inferior endplate compression fracture is unchanged from recent lumbar MRI. No evidence of acute pathologic fracture. IMPRESSION: 1. Small bilateral pleural effusions have minimally increased in size from prior chest CT. 2. Mild body wall edema. Otherwise no acute findings in the chest, abdomen, or pelvis. 3. Right pleural thickening is again seen. Extrapleural  low-density in the right hemithorax is diminished from prior chest CT, and was not hypermetabolic on prior PET. Right lower lobe pulmonary nodule is unchanged allowing for differences in caliper placement. Attention to this at follow-up recommended. 4. Similar interstitial thickening in the mid lower lung zones, may represent chronic edema. 5. Widespread bony metastatic disease, grossly stable from prior imaging. Mild L4 inferior endplate compression fracture was present on 10/28/2021 lumbar MRI. 6. Right inguinal hernia containing small bowel loops, not entirely included in the field of view. No evidence of obstruction or inflammation. 7. Nonobstructing right nephrolithiasis. 8. Incidental note of gallstones or sludge in the dependent gallbladder without evidence of cholecystitis. Aortic Atherosclerosis (ICD10-I70.0) and Emphysema (ICD10-J43.9). Electronically Signed   By: Keith Rake M.D.   On: 11/06/2021 18:17   DG Chest 2 View  Result Date: 11/06/2021 CLINICAL DATA:  Suspected sepsis.  Fever and chills EXAM: CHEST - 2 VIEW COMPARISON:  Chest 06/19/2021 FINDINGS: Heart size and vascularity normal.  Negative for heart failure. Negative for pneumonia. Multiple left rib fractures. Blunting left costophrenic angle unchanged likely due to pleural scarring or small effusion. IMPRESSION: 1. No acute cardiopulmonary abnormality. 2. Multiple left rib fractures.  Chronic blunting left costophrenic angle. Electronically Signed   By: Franchot Gallo M.D.   On: 11/06/2021 15:48    Procedures Procedures    Medications Ordered in ED Medications  lactated ringers infusion (0 mLs Intravenous Stopped 11/06/21 2202)  ceFEPIme (MAXIPIME) 2 g in sodium chloride 0.9 % 100 mL IVPB (has no administration in time range)  vancomycin (VANCOCIN) IVPB 1000 mg/200 mL premix (has no administration in time range)  alectinib (ALECENSA) capsule 600 mg (has no administration in time range)  furosemide (LASIX) tablet 20 mg (has no administration in time range)  rosuvastatin (CRESTOR) tablet 5 mg (has no administration in time range)  pantoprazole (PROTONIX) EC tablet 40 mg (has no administration in time range)  apixaban (ELIQUIS) tablet 2.5 mg (has no administration in time range)  clopidogrel (PLAVIX) tablet 75 mg (has no administration in time range)  ferrous [id number] C-folic acid (TRINSICON / FOLTRIN) capsule 1 capsule (has no administration in time range)  sodium chloride flush (NS) 0.9 % injection 3 mL (has no administration in time range)  acetaminophen (TYLENOL) tablet 650 mg (has no administration in time range)    Or  acetaminophen (TYLENOL) suppository 650 mg (has no administration in time range)  lactated ringers bolus 500 mL (has no administration in time range)  lactated ringers bolus 1,000 mL (0 mLs Intravenous Stopped 11/06/21 1826)    And  lactated ringers bolus 1,000 mL (0 mLs Intravenous Stopped 11/06/21 1710)    And  lactated ringers bolus 500 mL (0 mLs Intravenous Stopped 11/06/21 1937)  ceFEPIme (MAXIPIME) 2 g in sodium chloride 0.9 % 100 mL IVPB (0 g Intravenous Stopped 11/06/21 1710)  metroNIDAZOLE (FLAGYL) IVPB 500 mg (0 mg Intravenous Stopped 11/06/21 1735)  vancomycin (VANCOCIN) IVPB 1000 mg/200 mL premix (0 mg Intravenous Stopped 11/06/21 1826)  acetaminophen (TYLENOL) tablet 650 mg (650 mg Oral Given 11/06/21 1624)  vancomycin  (VANCOREADY) IVPB 750 mg/150 mL (0 mg Intravenous Stopped 11/06/21 1937)    ED Course/ Medical Decision Making/ A&P                           Medical Decision Making Amount and/or Complexity of Data Reviewed Labs: ordered. Radiology: ordered.  Risk OTC drugs. Prescription drug management. Decision regarding hospitalization.  This patient presents to the ED for concern of fevers and chills, this involves an extensive number of treatment options, and is a complaint that carries with it a high risk of complications and morbidity.  The differential diagnosis includes sepsis, viral infection, polypharmacy, thyroid storm   Co morbidities that complicate the patient evaluation  DM 2, HLD, gout, HTN, GERD, anemia, prior PE, current DVT, CAD, stage IV lung CA   Additional history obtained:  Additional history obtained from patient's family External records from outside source obtained and reviewed including EMR   Lab Tests:  I Ordered, and personally interpreted labs.  The pertinent results include: Baseline anemia, leukocytosis, uptrending creatinine over the past several weeks, COVID and flu negative   Imaging Studies ordered:  I ordered imaging studies including chest x-ray, CT of chest, abdomen, and pelvis I independently visualized and interpreted imaging which showed chronic findings of pleural effusions, right pleural thickening, metastatic disease I agree with the radiologist interpretation   Cardiac Monitoring: / EKG:  The patient was maintained on a cardiac monitor.  I personally viewed and interpreted the cardiac monitored which showed an underlying rhythm of: Sinus rhythm  Problem List / ED Course / Critical interventions / Medication management  Patient is a pleasant 81 year old male presenting from home for onset of fevers, chills, and generalized weakness starting last night.  Patient is ill-appearing on arrival in the ED.  Vital signs are notable for  tachycardia, tachypnea, and fever.  He also has a new oxygen requirement.  Per family, SPO2 was 84% on room air with EMS.  Patient is currently maintaining SPO2 in the high 90s on 3 L of supplemental oxygen.  Fortunately, at this time, he is normotensive.  Patient meets sepsis criteria and he was started on 30 cc/kg of IVF and broad-spectrum antibiotics.  Tylenol was given for antipyresis.  Laboratory work-up is notable for leukocytosis.  Lactic acid is normal.  Creatinine seems to be uptrending over the past several weeks.  Patient underwent CT imaging of chest, abdomen, and pelvis.  CT imaging showed chronic findings of known metastatic lung cancer.  There are no acute findings and source of infection is unclear at this time.  Patient had defervescence of fever and normalization of heart rate while in the ED.  Patient was admitted to medicine for further management. I ordered medication including IV fluids and broad-spectrum antibiotics for sepsis; Tylenol for antipyresis Reevaluation of the patient after these medicines showed that the patient improved I have reviewed the patients home medicines and have made adjustments as needed   Social Determinants of Health:  Lives at home with family  CRITICAL CARE Performed by: Godfrey Pick   Total critical care time: 34 minutes  Critical care time was exclusive of separately billable procedures and treating other patients.  Critical care was necessary to treat or prevent imminent or life-threatening deterioration.  Critical care was time spent personally by me on the following activities: development of treatment plan with patient and/or surrogate as well as nursing, discussions with consultants, evaluation of patient's response to treatment, examination of patient, obtaining history from patient or surrogate, ordering and performing treatments and interventions, ordering and review of laboratory studies, ordering and review of radiographic studies,  pulse oximetry and re-evaluation of patient's condition.          Final Clinical Impression(s) / ED Diagnoses Final diagnoses:  SIRS (systemic inflammatory response syndrome) (HCC)  Acute respiratory failure with hypoxia (HCC)    Rx / DC  Orders ED Discharge Orders     None         Godfrey Pick, MD 11/06/21 2340

## 2021-11-06 NOTE — Assessment & Plan Note (Addendum)
VQ scan had low risk of PE.  Patient breathing comfortably on room air. - Continuous pulse ox

## 2021-11-06 NOTE — Hospital Course (Addendum)
Arthur [name] is a 81 year old male who presented with chills and sweats for 1 day, cough for 3 days and fever.  Was hypotensive and tachycardic on admission concerning for SIRS.  Blood cultures positive for Arthur [name] bacteremia.  Significant past medical history of stage IV metastatic lung cancer, prior PE, chronic DVTs.  His hospital course outlined below.  Sepsis [name] blood cultures came back positive for Arthur [name] bacteremia.  Susceptibilities showed pan susceptible.  He was started on 3 days of IV ceftriaxone, step down to cefadroxil on day 3 of admission.  Definitive source was not identifiable during this admission.  If patient has another episode of Arthur [name] bacteremia, would recommend more thorough evaluation for source control.  Patient was afebrile and stable at time of discharge.  Hypoxia Patient had initial new oxygen requirement of 2 L oxygen, he does not use oxygen at home.  He was noted to have small bilateral pleural effusions on CT chest/abdomen/pelvis which had minimally increased from his prior CT scan several months ago. VQ scan was also performed, low probability for PE.  Patient improved clinically, successfully weaned off oxygen and stable on room air.  Normocytic anemia Patient has history of normocytic anemia, requiring iron.  Additionally he has a hematoma in left quadricep, which is stable.  He has a history of worsening bleeding with Lovenox, and was placed on Eliquis several weeks ago. He also has a history of hemolysis for which he was treated with prednisone.  He follows up with heme oncology for stage IV right lung cancer (see below).  Due to CAD and previous MI requiring CPR, his threshold to transfuse is 8.  Patient had hemoglobin less than 8 requiring transfusion, he received 1 unit of pRBC.  On work-up he was found to have hemolysis evidenced by increased bilirubin, low haptoglobin, elevated LDH and anemia.  Heme oncology was consulted, they recommended stopping  Eliquis and starting a prednisone taper. Also received a dose of IV iron during hospitalization. [name] hemoglobin stabilized during admission, discharged home with taper in good condition.  Adenocarcinoma right lung, stage IV [facility]) Patient follows with Dr. Earlie Server heme/oncology.  He was continued on alectinib for his adenocarcinoma.  Imaging did not show progression of metastases.  Dr. Earlie Server was consulted for concerns of anemia (see above).  All other chronic medical conditions were treated appropriately and stable time of discharge.  Follow up recommendations: Ensure completion of Duricef course and prednisone taper Repeat CBC to check Hgb. Hgb was 8 on day of discharge. CT chest w nonobstructing R renal stone and gallstones. Continue to monitor clinically Ensure follow up with heme/onc, scheduled for 10/18 Patient may benefit from continued [place] discussions. Evaluated by palliative during his stay. Remains full-code.   IMAGING   CXR IMPRESSION: 1. No acute cardiopulmonary abnormality. 2. Multiple left rib fractures. Chronic blunting left costophrenic angle.    CT ABD/PELV WO CONTRAST IMPRESSION: 1. Small bilateral pleural effusions have minimally increased in size from prior chest CT. 2. Mild body wall edema. Otherwise no acute findings in the chest, abdomen, or pelvis. 3. Right pleural thickening is again seen. Extrapleural low-density in the right hemithorax is diminished from prior chest CT, and was not hypermetabolic on prior PET. Right lower lobe pulmonary nodule is unchanged allowing for differences in caliper placement. Attention to this at follow-up recommended. 4. Similar interstitial thickening in the mid lower lung zones, may represent chronic edema. 5. Widespread bony metastatic disease, grossly stable from prior imaging. Mild L4  inferior endplate compression fracture was present on 10/28/2021 lumbar MRI. 6. Right inguinal hernia containing small bowel  loops, not entirely included in the field of view. No evidence of obstruction or inflammation. 7. Nonobstructing right nephrolithiasis. 8. Incidental note of gallstones or sludge in the dependent gallbladder without evidence of cholecystitis.   Aortic Atherosclerosis (ICD10-I70.0) and Emphysema (ICD10-J43.9).   VQ SCAN IMPRESSION: Two subsegmental perfusion abnormalities in the right lung with additional non-segmental perfusion defects related to pleural fluid and pleural thickening seen on recent chest imaging. Per modified PIOPED II criteria, findings are very low probability for pulmonary embolism.

## 2021-11-06 NOTE — Progress Notes (Signed)
Pharmacy Antibiotic Note  Arthur [name] is a 81 y.o. male admitted on 11/06/2021 with fever, chills and generalized weakness. PMH significant for prior PE on Eliquis and stage IV lung cancer on alecitinib. On presentation he was febrile, Wbc elevated, HR and RR elevated. He was diagnosed with sepsis, Pharmacy has been consulted for cefepime and vancomycin dosing. Renal function is variable over the past few months but seems to be around his baseline.   Plan: Initiate cefepime 2g Q12h Initiate vancomycin 1750mg  IV once, then 1000mg  Q24h (eAUC 457.1, Scr 1.45) Monitor renal function, s/s of infection, follow cultures for pathogen directed therapy  Weight: 76.5 kg (168 lb 10.4 oz)  Temp (24hrs), Avg:103.2 F (39.6 C), Min:103.2 F (39.6 C), Max:103.2 F (39.6 C)  Recent Labs  Lab 11/06/21 1530  WBC 17.3*    Estimated Creatinine Clearance: 46.7 mL/min (A) (by C-G formula based on SCr of 1.32 mg/dL (H)).    Allergies  Allergen Reactions   Ace Inhibitors     REACTION: Cough   Iohexol     Severe ATN after CT with contrast May 2023 - creatinine peaked near 8. Did not require HD.    Antimicrobials this admission: Vancomycin 10/10 >>  Cefepime 10/10 >>   Dose adjustments this admission: N/a  Microbiology results: 10/10 BCx: Collected 10/10 Resp panel: Collected  Thank you for allowing pharmacy to be a part of this patient's care.  Titus Dubin, PharmD PGY1 Pharmacy Resident 11/06/2021 4:58 PM

## 2021-11-06 NOTE — Assessment & Plan Note (Addendum)
Patient reports feeling 100% better today.  Leukocytosis trending down. - Continue IV ceftriaxone - Continue cardiac monitoring - Continuous pulse oximetry - As needed Tylenol for pain and fever

## 2021-11-06 NOTE — Assessment & Plan Note (Addendum)
Minimal changes from prior imaging.  PT recommending home health. Seen by palliative, appreciate their input.  - Continue alectinib - Continue to f/u with oncology outpatient

## 2021-11-07 ENCOUNTER — Inpatient Hospital Stay (HOSPITAL_COMMUNITY): Payer: Medicare Other

## 2021-11-07 ENCOUNTER — Other Ambulatory Visit: Payer: Self-pay | Admitting: *Deleted

## 2021-11-07 ENCOUNTER — Observation Stay (HOSPITAL_COMMUNITY): Payer: Medicare Other

## 2021-11-07 ENCOUNTER — Inpatient Hospital Stay: Payer: Medicare Other | Admitting: Internal Medicine

## 2021-11-07 ENCOUNTER — Other Ambulatory Visit: Payer: Self-pay

## 2021-11-07 ENCOUNTER — Encounter (HOSPITAL_COMMUNITY): Payer: Self-pay | Admitting: Family Medicine

## 2021-11-07 ENCOUNTER — Inpatient Hospital Stay: Payer: Medicare Other

## 2021-11-07 ENCOUNTER — Encounter (HOSPITAL_COMMUNITY): Payer: Medicare Other

## 2021-11-07 DIAGNOSIS — D599 Acquired hemolytic anemia, unspecified: Secondary | ICD-10-CM | POA: Diagnosis not present

## 2021-11-07 DIAGNOSIS — X58XXXA Exposure to other specified factors, initial encounter: Secondary | ICD-10-CM | POA: Diagnosis present

## 2021-11-07 DIAGNOSIS — S2232XA Fracture of one rib, left side, initial encounter for closed fracture: Secondary | ICD-10-CM | POA: Insufficient documentation

## 2021-11-07 DIAGNOSIS — J90 Pleural effusion, not elsewhere classified: Secondary | ICD-10-CM | POA: Diagnosis not present

## 2021-11-07 DIAGNOSIS — M79606 Pain in leg, unspecified: Secondary | ICD-10-CM | POA: Diagnosis not present

## 2021-11-07 DIAGNOSIS — D649 Anemia, unspecified: Secondary | ICD-10-CM

## 2021-11-07 DIAGNOSIS — K219 Gastro-esophageal reflux disease without esophagitis: Secondary | ICD-10-CM | POA: Diagnosis present

## 2021-11-07 DIAGNOSIS — E785 Hyperlipidemia, unspecified: Secondary | ICD-10-CM | POA: Diagnosis present

## 2021-11-07 DIAGNOSIS — E782 Mixed hyperlipidemia: Secondary | ICD-10-CM

## 2021-11-07 DIAGNOSIS — D631 Anemia in chronic kidney disease: Secondary | ICD-10-CM | POA: Diagnosis not present

## 2021-11-07 DIAGNOSIS — Z7902 Long term (current) use of antithrombotics/antiplatelets: Secondary | ICD-10-CM | POA: Diagnosis not present

## 2021-11-07 DIAGNOSIS — D589 Hereditary hemolytic anemia, unspecified: Secondary | ICD-10-CM | POA: Diagnosis present

## 2021-11-07 DIAGNOSIS — M21372 Foot drop, left foot: Secondary | ICD-10-CM

## 2021-11-07 DIAGNOSIS — C7951 Secondary malignant neoplasm of bone: Secondary | ICD-10-CM | POA: Diagnosis not present

## 2021-11-07 DIAGNOSIS — N1831 Chronic kidney disease, stage 3a: Secondary | ICD-10-CM | POA: Diagnosis not present

## 2021-11-07 DIAGNOSIS — Z7189 Other specified counseling: Secondary | ICD-10-CM | POA: Diagnosis not present

## 2021-11-07 DIAGNOSIS — R531 Weakness: Secondary | ICD-10-CM | POA: Diagnosis not present

## 2021-11-07 DIAGNOSIS — R0902 Hypoxemia: Secondary | ICD-10-CM | POA: Diagnosis not present

## 2021-11-07 DIAGNOSIS — A4151 Sepsis due to Escherichia coli [E. coli]: Secondary | ICD-10-CM | POA: Diagnosis present

## 2021-11-07 DIAGNOSIS — R509 Fever, unspecified: Secondary | ICD-10-CM | POA: Diagnosis not present

## 2021-11-07 DIAGNOSIS — M7989 Other specified soft tissue disorders: Secondary | ICD-10-CM | POA: Insufficient documentation

## 2021-11-07 DIAGNOSIS — Z7901 Long term (current) use of anticoagulants: Secondary | ICD-10-CM | POA: Diagnosis not present

## 2021-11-07 DIAGNOSIS — I700 Atherosclerosis of aorta: Secondary | ICD-10-CM | POA: Diagnosis present

## 2021-11-07 DIAGNOSIS — Z20822 Contact with and (suspected) exposure to covid-19: Secondary | ICD-10-CM | POA: Diagnosis not present

## 2021-11-07 DIAGNOSIS — Z8674 Personal history of sudden cardiac arrest: Secondary | ICD-10-CM | POA: Diagnosis not present

## 2021-11-07 DIAGNOSIS — R6520 Severe sepsis without septic shock: Secondary | ICD-10-CM | POA: Diagnosis not present

## 2021-11-07 DIAGNOSIS — J9601 Acute respiratory failure with hypoxia: Secondary | ICD-10-CM | POA: Diagnosis not present

## 2021-11-07 DIAGNOSIS — I252 Old myocardial infarction: Secondary | ICD-10-CM | POA: Diagnosis not present

## 2021-11-07 DIAGNOSIS — C3491 Malignant neoplasm of unspecified part of right bronchus or lung: Secondary | ICD-10-CM | POA: Diagnosis not present

## 2021-11-07 DIAGNOSIS — C3490 Malignant neoplasm of unspecified part of unspecified bronchus or lung: Secondary | ICD-10-CM | POA: Diagnosis not present

## 2021-11-07 DIAGNOSIS — Z86718 Personal history of other venous thrombosis and embolism: Secondary | ICD-10-CM | POA: Diagnosis not present

## 2021-11-07 DIAGNOSIS — Z79899 Other long term (current) drug therapy: Secondary | ICD-10-CM | POA: Diagnosis not present

## 2021-11-07 DIAGNOSIS — S7010XA Contusion of unspecified thigh, initial encounter: Secondary | ICD-10-CM | POA: Diagnosis not present

## 2021-11-07 DIAGNOSIS — J9811 Atelectasis: Secondary | ICD-10-CM | POA: Diagnosis not present

## 2021-11-07 DIAGNOSIS — J189 Pneumonia, unspecified organism: Secondary | ICD-10-CM | POA: Diagnosis present

## 2021-11-07 DIAGNOSIS — S2242XA Multiple fractures of ribs, left side, initial encounter for closed fracture: Secondary | ICD-10-CM | POA: Diagnosis not present

## 2021-11-07 DIAGNOSIS — J439 Emphysema, unspecified: Secondary | ICD-10-CM | POA: Diagnosis present

## 2021-11-07 DIAGNOSIS — Z86711 Personal history of pulmonary embolism: Secondary | ICD-10-CM | POA: Diagnosis not present

## 2021-11-07 DIAGNOSIS — E1122 Type 2 diabetes mellitus with diabetic chronic kidney disease: Secondary | ICD-10-CM | POA: Diagnosis not present

## 2021-11-07 LAB — BLOOD CULTURE ID PANEL (REFLEXED) - BCID2

## 2021-11-07 LAB — DIFFERENTIAL
Abs Immature Granulocytes: 0.14 10*3/uL — ABNORMAL HIGH (ref 0.00–0.07)
Basophils Absolute: 0.1 10*3/uL (ref 0.0–0.1)
Basophils Relative: 1 %
Eosinophils Absolute: 0.2 10*3/uL (ref 0.0–0.5)
Eosinophils Relative: 1 %
Immature Granulocytes: 1 %
Lymphocytes Relative: 10 %
Lymphs Abs: 1.7 10*3/uL (ref 0.7–4.0)
Monocytes Absolute: 1.2 10*3/uL — ABNORMAL HIGH (ref 0.1–1.0)
Monocytes Relative: 7 %
Neutro Abs: 14.1 10*3/uL — ABNORMAL HIGH (ref 1.7–7.7)
Neutrophils Relative %: 80 %

## 2021-11-07 LAB — CBC
HCT: 22.5 % — ABNORMAL LOW (ref 39.0–52.0)
HCT: 22.9 % — ABNORMAL LOW (ref 39.0–52.0)
Hemoglobin: 7.7 g/dL — ABNORMAL LOW (ref 13.0–17.0)
Hemoglobin: 7.8 g/dL — ABNORMAL LOW (ref 13.0–17.0)
MCH: 29.3 pg (ref 26.0–34.0)
MCH: 29.5 pg (ref 26.0–34.0)
MCHC: 34.2 g/dL (ref 30.0–36.0)
MCHC: 34.1 g/dL (ref 30.0–36.0)
MCV: 85.6 fL (ref 80.0–100.0)
MCV: 86.7 fL (ref 80.0–100.0)
Platelets: 327 10*3/uL (ref 150–400)
Platelets: 332 10*3/uL (ref 150–400)
RBC: 2.63 MIL/uL — ABNORMAL LOW (ref 4.22–5.81)
RBC: 2.64 MIL/uL — ABNORMAL LOW (ref 4.22–5.81)
RDW: 16 % — ABNORMAL HIGH (ref 11.5–15.5)
RDW: 16 % — ABNORMAL HIGH (ref 11.5–15.5)
WBC: 19.2 10*3/uL — ABNORMAL HIGH (ref 4.0–10.5)
WBC: 17.5 10*3/uL — ABNORMAL HIGH (ref 4.0–10.5)
nRBC: 0 % (ref 0.0–0.2)
nRBC: 0.1 % (ref 0.0–0.2)

## 2021-11-07 LAB — BASIC METABOLIC PANEL
Anion gap: 8 (ref 5–15)
BUN: 20 mg/dL (ref 8–23)
CO2: 25 mmol/L (ref 22–32)
Calcium: 8.1 mg/dL — ABNORMAL LOW (ref 8.9–10.3)
Chloride: 106 mmol/L (ref 98–111)
Creatinine, Ser: 1.45 mg/dL — ABNORMAL HIGH (ref 0.61–1.24)
GFR, Estimated: 48 mL/min — ABNORMAL LOW (ref >60)
Glucose, Bld: 109 mg/dL — ABNORMAL HIGH (ref 70–99)
Potassium: 3.7 mmol/L (ref 3.5–5.1)
Sodium: 139 mmol/L (ref 135–145)

## 2021-11-07 LAB — HEPATIC FUNCTION PANEL
ALT: 18 U/L (ref 0–44)
AST: 37 U/L (ref 15–41)
Albumin: 2.2 g/dL — ABNORMAL LOW (ref 3.5–5.0)
Alkaline Phosphatase: 74 U/L (ref 38–126)
Bilirubin, Direct: 0.5 mg/dL — ABNORMAL HIGH (ref 0.0–0.2)
Indirect Bilirubin: 1.3 mg/dL — ABNORMAL HIGH (ref 0.3–0.9)
Total Bilirubin: 1.8 mg/dL — ABNORMAL HIGH (ref 0.3–1.2)
Total Protein: 5.2 g/dL — ABNORMAL LOW (ref 6.5–8.1)

## 2021-11-07 LAB — BRAIN NATRIURETIC PEPTIDE: B Natriuretic Peptide: 782.9 pg/mL — ABNORMAL HIGH (ref 0.0–100.0)

## 2021-11-07 LAB — TECHNOLOGIST SMEAR REVIEW

## 2021-11-07 LAB — LACTATE DEHYDROGENASE: LDH: 249 U/L — ABNORMAL HIGH (ref 98–192)

## 2021-11-07 LAB — IRON AND TIBC
Iron: 31 ug/dL — ABNORMAL LOW (ref 45–182)
Saturation Ratios: 14 % — ABNORMAL LOW (ref 17.9–39.5)
TIBC: 224 ug/dL — ABNORMAL LOW (ref 250–450)
UIBC: 193 ug/dL

## 2021-11-07 LAB — PREPARE RBC (CROSSMATCH)

## 2021-11-07 LAB — TROPONIN I (HIGH SENSITIVITY): Troponin I (High Sensitivity): 360 ng/L (ref <18)

## 2021-11-07 LAB — TSH: TSH: 2.483 u[IU]/mL (ref 0.350–4.500)

## 2021-11-07 MED ORDER — TAB-A-VITE/IRON PO TABS
1.0000 | ORAL_TABLET | Freq: Every day | ORAL | Status: DC
  Administered 2021-11-07 – 2021-11-09 (×3): 1 via ORAL
  Filled 2021-11-07 (×4): qty 1

## 2021-11-07 MED ORDER — SODIUM CHLORIDE 0.9 % IV SOLN
2.0000 g | INTRAVENOUS | Status: AC
  Administered 2021-11-07 – 2021-11-09 (×3): 2 g via INTRAVENOUS
  Filled 2021-11-07 (×4): qty 20

## 2021-11-07 MED ORDER — OMEPRAZOLE 20 MG PO CPDR: 20.0000 mg | DELAYED_RELEASE_CAPSULE | Freq: Every day | ORAL | 0 refills | Status: DC | PRN

## 2021-11-07 MED ORDER — SODIUM CHLORIDE 0.9% IV SOLUTION: Freq: Once | INTRAVENOUS | Status: AC

## 2021-11-07 MED ORDER — TECHNETIUM TO 99M ALBUMIN AGGREGATED
3.8000 | Freq: Once | INTRAVENOUS | Status: AC | PRN
  Administered 2021-11-07: 3.8 via INTRAVENOUS

## 2021-11-07 MED ORDER — IPRATROPIUM-ALBUTEROL 0.5-2.5 (3) MG/3ML IN SOLN
3.0000 mL | Freq: Four times a day (QID) | RESPIRATORY_TRACT | Status: DC | PRN
  Administered 2021-11-07 – 2021-11-09 (×3): 3 mL via RESPIRATORY_TRACT
  Filled 2021-11-07 (×4): qty 3

## 2021-11-07 NOTE — ED Notes (Signed)
Patient transported to X-ray 

## 2021-11-07 NOTE — Evaluation (Signed)
Physical Therapy Evaluation Patient Details Name: Arthur [name] MRN: [id number] DOB: 29-Mar-1940 Today's Date: 11/07/2021  History of Present Illness  Pt is an 81 y/o male admitted for sepsis, acute hypoxemic respiratory failure, and reduced urine output. PMH: Stage IV lung CA, prior PE, DVT, MIq  Clinical Impression  Pt admitted secondary to problem above with deficits below. Pt with increased DOE at 3/5, but oxygen sats WFL on RA. Requiring min guard for mobility tasks using RW. Recommending HHPT at d/c to address deficits. Will continue to follow acutely.      Recommendations for follow up therapy are one component of a multi-disciplinary discharge planning process, led by the attending physician.  Recommendations may be updated based on patient status, additional functional criteria and insurance authorization.  Follow Up Recommendations Home health PT      Assistance Recommended at Discharge Frequent or constant Supervision/Assistance  Patient can return home with the following  A little help with walking and/or transfers;A little help with bathing/dressing/bathroom;Assistance with cooking/housework;Assist for transportation;Help with stairs or ramp for entrance    Equipment Recommendations None recommended by PT  Recommendations for Other Services       Functional Status Assessment Patient has had a recent decline in their functional status and demonstrates the ability to make significant improvements in function in a reasonable and predictable amount of time.     Precautions / Restrictions Precautions Precautions: Fall;Other (comment) Precaution Comments: monitor DOE, BP Required Braces or Orthoses: Other Brace Other Brace: Has AFO, but did not bring to hospital Restrictions Weight Bearing Restrictions: No      Mobility  Bed Mobility Overal bed mobility: Needs Assistance Bed Mobility: Supine to Sit, Sit to Supine     Supine to sit: Supervision, HOB elevated Sit  to supine: Supervision   General bed mobility comments: Supervision for safety. Pt using UEs to assist legs back onto bed    Transfers Overall transfer level: Needs assistance Equipment used: Rolling walker (2 wheels) Transfers: Sit to/from Stand Sit to Stand: Min guard           General transfer comment: Min guard for safety.    Ambulation/Gait Ambulation/Gait assistance: Min guard Gait Distance (Feet): 8 Feet Assistive device: Rolling walker (2 wheels) Gait Pattern/deviations: Step-through pattern, Decreased stride length Gait velocity: Decreased     General Gait Details: took steps forward and back at EOB. INcreased DOE, but oxygen sats WFL on RA throughout.  Stairs            Wheelchair Mobility    Modified Rankin (Stroke Patients Only)       Balance Overall balance assessment: Needs assistance Sitting-balance support: No upper extremity supported, Feet supported Sitting balance-Leahy Scale: Fair     Standing balance support: Bilateral upper extremity supported, During functional activity Standing balance-Leahy Scale: Poor Standing balance comment: Reliant on UE support                             Pertinent Vitals/Pain Pain Assessment Pain Assessment: No/denies pain    Home Living Family/patient expects to be discharged to:: Private residence Living Arrangements: Children Available Help at Discharge: Family;Available 24 hours/day Type of Home: House Home Access: Stairs to enter   [employer] of Steps: 1   Home Layout: Able to live on main level with bedroom/bathroom;Two level Home Equipment: Shower seat;Hand held Engineering geologist (2 wheels);Cane - single point;Rollator (4 wheels) Additional Comments: Was working with HHPT/OT -  just finished 2 weeks ago    Prior Function Prior Level of Function : Independent/Modified Independent             Mobility Comments: using RW for mobility d/t L foot drop ADLs  Comments: Reports able to toilet, bathe self - some assist for LB dressing. Family assist with IADLs. pt no longer drives     Hand Dominance   Dominant Hand: Right    Extremity/Trunk Assessment   Upper Extremity Assessment Upper Extremity Assessment: Defer to OT evaluation    Lower Extremity Assessment Lower Extremity Assessment: Generalized weakness    Cervical / Trunk Assessment Cervical / Trunk Assessment: Normal  Communication   Communication: Prefers language other than [place];Other (comment) (can speak basic [place]. Daughter assisting with interpreting as needed)  Cognition Arousal/Alertness: Awake/alert Behavior During Therapy: WFL for tasks assessed/performed Overall Cognitive Status: Within Functional Limits for tasks assessed                                          General Comments      Exercises     Assessment/Plan    PT Assessment Patient needs continued PT services  PT Problem List Decreased strength;Decreased activity tolerance;Decreased mobility;Decreased balance;Cardiopulmonary status limiting activity       PT Treatment Interventions DME instruction;Gait training;Stair training;Functional mobility training;Therapeutic activities;Therapeutic exercise;Balance training;Patient/family education    PT Goals (Current goals can be found in the Care Plan section)  Acute Rehab PT Goals Patient Stated Goal: to go home PT Goal Formulation: With patient Time For Goal Achievement: 11/21/21 Potential to Achieve Goals: Good    Frequency Min 3X/week     Co-evaluation               AM-PAC PT "6 Clicks" Mobility  Outcome Measure Help needed turning from your back to your side while in a flat bed without using bedrails?: A Little Help needed moving from lying on your back to sitting on the side of a flat bed without using bedrails?: A Little Help needed moving to and from a bed to a chair (including a wheelchair)?: A Little Help  needed standing up from a chair using your arms (e.g., wheelchair or bedside chair)?: A Little Help needed to walk in hospital room?: A Little Help needed climbing 3-5 steps with a railing? : A Lot 6 Click Score: 17    End of Session   Activity Tolerance: Patient limited by fatigue Patient left: in bed;with call bell/phone within reach;with family/visitor present (on stretcher in ED) Nurse Communication: Mobility status PT Visit Diagnosis: Unsteadiness on feet (R26.81);Muscle weakness (generalized) (M62.81)    Time: 5638-9373 PT Time Calculation (min) (ACUTE ONLY): 17 min   Charges:   PT Evaluation $PT Eval Moderate Complexity: 1 Mod          Reuel Derby, PT, DPT  Acute Rehabilitation Services  Office: [phone]   Rudean Hitt 11/07/2021, 4:02 PM

## 2021-11-07 NOTE — Progress Notes (Signed)
FMTS Brief Progress Note  S: In to check on patient. He and daughter were initially asleep but awoke during examination. Patient reports feeling improved. Updated them on blood culture results which +e. Coli. Unknown source at this time. He denies any urinary symptoms.    O: BP (!) 105/54 (BP Location: Right Arm)   Pulse 64   Temp (!) 97 F (36.1 C) (Oral)   Resp 19   Wt 76.5 kg   SpO2 100%   BMI 23.52 kg/m   Gen: Asleep but awoke easily to tactile stimulation, NAD, pleasant Resp: On 2L [place], saturations 98-100%, CTAB Cardio: RRR, sinus 60s on tele   A/P: E coli bacteremia D/C flagyl and vanc. Continue cefepime with plans to switch to CTX today or tomorrow. Unclear cause of bacteremia at this time. He appears stable. MAPs did drop <60 and HR <60 while sleeping but vitals were more reassuring in the room when patient was awake. Was able to wean oxygen down to 1L, suspect he will be able to be weaned to RA today.  -Continue abx -Monitor vitals closely -Continue fluids at 150 mL/hr (last echo in May showed normal EF 65-70%)  -Wean oxygen as able, keep sats >92%   Sharion Settler, DO 11/07/2021, 5:45 AM PGY-3, [facility] Night Resident  Please page [phone] with questions.

## 2021-11-07 NOTE — Evaluation (Signed)
Occupational Therapy Evaluation Patient Details Name: Arthur [name] MRN: [id number] DOB: 1940/06/23 Today's Date: 11/07/2021   History of Present Illness Pt is an 81 y/o male admitted for sepsis, acute hypoxemic respiratory failure, and reduced urine output. PMH: Stage IV lung CA, prior PE, DVT, MIq   Clinical Impression   PTA, pt lives with family, typically ambulatory with RW in the home and able to toilet/bathe self with Modified [place]. Family assists with IADLs and LB dressing tasks. Pt presents now fairly close to reported baseline though noted DOE with standing ADLs. Pt requires Setup for UB ADLs, Min A for LB ADLs and min guard for transfers with RW. Vitals stable on RA despite 3/4 DOE noted. Pt's daughter present, supportive and hopeful for pt endurance to improve to baseline. Pt recently finished working with [facility] therapies but if current deficits persist, family open to [facility] therapies again if needed.       Recommendations for follow up therapy are one component of a multi-disciplinary discharge planning process, led by the attending physician.  Recommendations may be updated based on patient status, additional functional criteria and insurance authorization.   Follow Up Recommendations  Home health OT (vs no OT pending progress)    Assistance Recommended at Discharge Intermittent Supervision/Assistance  Patient can return home with the following A little help with bathing/dressing/bathroom    Functional Status Assessment  Patient has had a recent decline in their functional status and demonstrates the ability to make significant improvements in function in a reasonable and predictable amount of time.  Equipment Recommendations  None recommended by OT    Recommendations for Other Services       Precautions / Restrictions Precautions Precautions: Fall;Other (comment) Precaution Comments: monitor DOE, BP Restrictions Weight Bearing Restrictions: No      Mobility  Bed Mobility Overal bed mobility: Needs Assistance Bed Mobility: Supine to Sit, Sit to Supine     Supine to sit: Supervision, HOB elevated Sit to supine: Supervision        Transfers Overall transfer level: Needs assistance Equipment used: Rolling walker (2 wheels) Transfers: Sit to/from Stand Sit to Stand: Min guard                  Balance Overall balance assessment: Needs assistance Sitting-balance support: No upper extremity supported, Feet supported Sitting balance-Leahy Scale: Fair     Standing balance support: Single extremity supported, Bilateral upper extremity supported, During functional activity Standing balance-Leahy Scale: Poor Standing balance comment: reliant on at least one UE support in standing                           ADL either performed or assessed with clinical judgement   ADL Overall ADL's : Needs assistance/impaired Eating/Feeding: Independent   Grooming: Set up;Sitting   Upper Body Bathing: Set up;Sitting   Lower Body Bathing: Minimal assistance;Sit to/from stand   Upper Body Dressing : Set up;Sitting   Lower Body Dressing: Minimal assistance;Sit to/from stand   Toilet Transfer: Min guard;Stand-pivot;Rolling walker (2 wheels)   Toileting- Clothing Manipulation and Hygiene: Minimal assistance;Sit to/from stand Toileting - Clothing Manipulation Details (indicate cue type and reason): assist for clothing mgmt in standing during urinal use. LOB when UE not supported       General ADL Comments: limited by DOE and weakness. Pt fairly close to reported baseline     Vision Baseline Vision/History: 1 Wears glasses Ability to See in Adequate Light: 0 Adequate  Patient Visual Report: No change from baseline Vision Assessment?: No apparent visual deficits     Perception     Praxis      Pertinent Vitals/Pain Pain Assessment Pain Assessment: No/denies pain     Hand Dominance Right   Extremity/Trunk Assessment Upper  Extremity Assessment Upper Extremity Assessment: Generalized weakness   Lower Extremity Assessment Lower Extremity Assessment: Defer to PT evaluation   Cervical / Trunk Assessment Cervical / Trunk Assessment: Normal   Communication Communication Communication: Prefers language other than [place];Other (comment) (can speak basic [place]. Daughter assisting with interpreting as needed)   Cognition Arousal/Alertness: Awake/alert Behavior During Therapy: WFL for tasks assessed/performed Overall Cognitive Status: Within Functional Limits for tasks assessed                                       General Comments  Daughter present, hands on to assist    Exercises     Shoulder Instructions      Home Living Family/patient expects to be discharged to:: Private residence Living Arrangements: Children Available Help at Discharge: Family;Available 24 hours/day Type of Home: House Home Access: Stairs to enter [employer] of Steps: 1   Home Layout: Able to live on main level with bedroom/bathroom;Two level     Bathroom Shower/Tub: Occupational psychologist: Standard Bathroom Accessibility: No   Home Equipment: Shower seat;Hand held Engineering geologist (2 wheels);Cane - single point;Rollator (4 wheels)   Additional Comments: Was working with HHPT/OT - just finished 2 weeks ago      Prior Functioning/Environment Prior Level of Function : Independent/Modified Independent             Mobility Comments: using RW for mobility d/t L foot drop ADLs Comments: Reports able to toilet, bathe self - some assist for LB dressing. Family assist with IADLs. pt no longer drives        OT Problem List: Decreased strength;Decreased activity tolerance;Impaired balance (sitting and/or standing);Cardiopulmonary status limiting activity      OT Treatment/Interventions: Self-care/ADL training;Therapeutic exercise;DME and/or AE instruction;Energy  conservation;Therapeutic activities;Patient/family education;Balance training    OT Goals(Current goals can be found in the care plan section) Acute Rehab OT Goals Patient Stated Goal: for pt to feel better, improve mobility OT Goal Formulation: With patient/family Time For Goal Achievement: 11/21/21 Potential to Achieve Goals: Good  OT Frequency: Min 2X/week    Co-evaluation              AM-PAC OT "6 Clicks" Daily Activity     Outcome Measure Help from another person eating meals?: A Little Help from another person taking care of personal grooming?: A Little Help from another person toileting, which includes using toliet, bedpan, or urinal?: A Little Help from another person bathing (including washing, rinsing, drying)?: A Little Help from another person to put on and taking off regular upper body clothing?: A Little Help from another person to put on and taking off regular lower body clothing?: A Little 6 Click Score: 18   End of Session Equipment Utilized During Treatment: Rolling walker (2 wheels) Nurse Communication: Mobility status;Other (comment) (O2)  Activity Tolerance: Patient tolerated treatment well Patient left: in bed;with call bell/phone within reach  OT Visit Diagnosis: Other abnormalities of gait and mobility (R26.89);Muscle weakness (generalized) (M62.81)                Time: 3546-5681 OT Time Calculation (min):  23 min Charges:  OT General Charges $OT Visit: 1 Visit OT Evaluation $OT Eval Low Complexity: 1 Low  Malachy Chamber, OTR/L Acute Rehab Services Office: [phone]   Layla Maw 11/07/2021, 10:37 AM

## 2021-11-07 NOTE — Progress Notes (Addendum)
FMTS Brief Progress Note  S:In to see patient with Dr. Joeseph Amor given RN noted hypotension with MAP <60 and some reported lethargy. Daughter (who is also an Therapist, sports) was in the room. She notes that her father has been doing well. Per her report, he is very tired as he did not get much sleep. He is typically very "out of it" when he is tired. She does not feel that his condition has worsened and she feels that he is at his baseline.   We awoke patient who was able to tell us that he is feeling better and is just very tired. He denied any chills or pain.   Daughter notes that his home BP are typically 100s/60s.    O: BP (!) 96/46   Pulse 63   Temp 98.4 F (36.9 C) (Oral)   Resp 12   Wt 76.5 kg   SpO2 99%   BMI 23.52 kg/m   Gen: Asleep but easily awakens to verbal and tactile stimuli, NAD  Resp: Normal effort on room air, intermittent snoring while asleep  Card: Rate 60s, sinus on tele   A/P: Acute on chronic hypotension ?septic shock but he appears well and non-toxic. Asymptomatic and feeling improved per his report and daughter feels he is at his baseline. Currently receiving bolus and then will continue on IVF at 150 mL/hr. He has had only 200 mL of UOP though despite aggressive fluid resuscitation. Has not had recurrence of fever. Lactic acid was normal.  -Bladder scan to ensure not retaining -Continue to monitor vitals closely  -Plan to consult CCM if clinical status worsens  -Continue broad spectrum abx   Sharion Settler, DO 11/07/2021, 12:23 AM PGY-3, [facility] Night Resident  Please page [phone] with questions.

## 2021-11-07 NOTE — Progress Notes (Signed)
PHARMACY - PHYSICIAN COMMUNICATION CRITICAL VALUE ALERT - BLOOD CULTURE IDENTIFICATION (BCID)  Arthur [name] is an 81 y.o. male who presented to [facility] on 11/06/2021 with a chief complaint of sepsis, now found to have E Coli bacteremia.   Name of physician (or Provider) Contacted: Dr. Nita Sells   Current antibiotics: Vancomycin, Cefepime  Changes to prescribed antibiotics recommended:  Cont Cefepime for now DC vancomycin   Results for orders placed or performed during the hospital encounter of 11/06/21  Blood Culture ID Panel (Reflexed) (Collected: 11/06/2021  3:30 PM)  Result Value Ref Range   Enterococcus faecalis NOT DETECTED NOT DETECTED   Enterococcus Faecium NOT DETECTED NOT DETECTED   Listeria monocytogenes NOT DETECTED NOT DETECTED   Staphylococcus species NOT DETECTED NOT DETECTED   Staphylococcus aureus (BCID) NOT DETECTED NOT DETECTED   Staphylococcus epidermidis NOT DETECTED NOT DETECTED   Staphylococcus lugdunensis NOT DETECTED NOT DETECTED   Streptococcus species NOT DETECTED NOT DETECTED   Streptococcus agalactiae NOT DETECTED NOT DETECTED   Streptococcus pneumoniae NOT DETECTED NOT DETECTED   Streptococcus pyogenes NOT DETECTED NOT DETECTED   A.calcoaceticus-baumannii NOT DETECTED NOT DETECTED   Bacteroides fragilis NOT DETECTED NOT DETECTED   Enterobacterales DETECTED (A) NOT DETECTED   Enterobacter cloacae complex NOT DETECTED NOT DETECTED   Escherichia coli DETECTED (A) NOT DETECTED   Klebsiella aerogenes NOT DETECTED NOT DETECTED   Klebsiella oxytoca NOT DETECTED NOT DETECTED   Klebsiella pneumoniae NOT DETECTED NOT DETECTED   Proteus species NOT DETECTED NOT DETECTED   Salmonella species NOT DETECTED NOT DETECTED   Serratia marcescens NOT DETECTED NOT DETECTED   Haemophilus influenzae NOT DETECTED NOT DETECTED   Neisseria meningitidis NOT DETECTED NOT DETECTED   Pseudomonas aeruginosa NOT DETECTED NOT DETECTED   Stenotrophomonas maltophilia NOT  DETECTED NOT DETECTED   Candida albicans NOT DETECTED NOT DETECTED   Candida auris NOT DETECTED NOT DETECTED   Candida glabrata NOT DETECTED NOT DETECTED   Candida krusei NOT DETECTED NOT DETECTED   Candida parapsilosis NOT DETECTED NOT DETECTED   Candida tropicalis NOT DETECTED NOT DETECTED   Cryptococcus neoformans/gattii NOT DETECTED NOT DETECTED   CTX-M ESBL NOT DETECTED NOT DETECTED   Carbapenem resistance IMP NOT DETECTED NOT DETECTED   Carbapenem resistance KPC NOT DETECTED NOT DETECTED   Carbapenem resistance NDM NOT DETECTED NOT DETECTED   Carbapenem resist OXA 48 LIKE NOT DETECTED NOT DETECTED   Carbapenem resistance VIM NOT DETECTED NOT DETECTED    Narda Bonds, PharmD, BCPS Clinical Pharmacist Phone: [phone]

## 2021-11-07 NOTE — Assessment & Plan Note (Deleted)
Appear to be chronic. Also seen on CXR from May. No recent trauma to chest. Denies any pain to chest or pain with inspiration.  -Monitor

## 2021-11-07 NOTE — ED Notes (Signed)
Pt appearing short of breath, reporting leg cramping, mild wheezing, no distress at this time. Pt remains on room air, oxygen saturations within normal limit. Admitting team notified.

## 2021-11-07 NOTE — Assessment & Plan Note (Deleted)
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  There was some concern about hematoma and anticoagulation with Eliquis.  With patient's declining hemoglobin, we will discuss care goals and anticoagulation with patient. -PT/OT eval and treat - Discuss anticoagulation

## 2021-11-07 NOTE — Assessment & Plan Note (Deleted)
Last lipid panel in 01/2021, LDL 71. On Crestor 5 mg. -Continue crestor

## 2021-11-07 NOTE — ED Notes (Signed)
MD aware of decreased BP. Bolus and fluids started.  Pt a sleep but wakes easily. Is lethargic and falls back asleep quickly. Denies pain and N/V  Daughter at bedside states pt is often lethargic upon waking

## 2021-11-07 NOTE — Assessment & Plan Note (Deleted)
In January of this year. Presumed to be Vtach/PEA arrest. ROSC achieved after 5 minutes. ?if rib fractures still present from CPR.  -Desires full code -Continuous tele -Optimize electrolytes

## 2021-11-07 NOTE — Assessment & Plan Note (Addendum)
Hemolytic anemia. Hgb 8.0 today (from 9.9 yesterday). S/p 1u pRBC on 10/11. Consulted Dr. Earlie Server with heme oncology-recommended stopping Eliquis and prednisone taper. Also gave IV Feraheme transfusion x1. Transfusion threshold 8. - prednisone taper per oncology recs - Daily CBC - Continue home iron

## 2021-11-07 NOTE — Progress Notes (Signed)
Daily Progress Note Intern Pager: [phone]  Patient name: Arthur [name] Medical record number: [id number] Date of birth: Nov 05, 1940 Age: 81 y.o. Gender: male  Primary Care Provider: Lind Covert, MD Consultants: None Code Status: Full code  Pt Overview and Major Events to Date:  10/10: Admitted to FMTS 10/11: Hgb <8 on repeat, threshold 8, 1 unit pRBC  Assessment and Plan: Arthur [name] is a 81 year old male presenting with chills and cough, found to have E. coli bacteremia.  Significant medical history includes stage IV metastatic lung cancer, prior PE, DVT, MI requiring CPR.   * Sepsis ([place]) Blood culture positive for E. Coli.  Currently on unknown source, GI versus urinary. - Transitioned to ceftriaxone IV - Continue cardiac monitoring - Continuous pulse oximetry - Supplemental O2, wean as tolerated - As needed Tylenol for pain and fever - 75 mL/h IV LR, consider discontinuation with improved p.o.  Normocytic anemia Hemoglobin of 7.7, repeat 7.8.  Transfusion threshold 8.  Patient has history of hemolytic anemia, for which she is previously treated on prednisone.  Heme-onc had mentioned that worsening edema may be treated with repeat steroids. - Patient consented, 1 unit pRBC transfusion - Follow-up H&H posttransfusion - Daily CBC - Pending LDH, haptoglobin, TIBC results - Continue home iron  Hypoxia Saturations above 94% on room air.  History of chronic DVT, patient has hypercoagulable state, is on Eliquis for anticoagulation.  There was possible concern of PE, due to new requirement of oxygen.  We will still follow-up VQ scan to be sure. - Continuous pulse ox - Pending VQ scan  Pleural effusion Repeat CXR, stable. -Monitor O2 requirement, if worsens consider diuresis  Left rib fracture Appear to be chronic. Also seen on CXR from May. No recent trauma to chest. Denies any pain to chest or pain with inspiration.  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  There was some concern about hematoma and anticoagulation with Eliquis.  With patient's declining hemoglobin, we will discuss care goals and anticoagulation with patient. -PT/OT eval and treat - Discuss anticoagulation  Adenocarcinoma of right lung, stage 4 (HCC) Minimal changes from prior imaging. -Continue alectinib -PT/OT -If worsening clinically, consider oncology consult   History of cardiac arrest In January of this year. Presumed to be Vtach/PEA arrest. ROSC achieved after 5 minutes. ?if rib fractures still present from CPR.  -Desires full code -Continuous tele -Optimize electrolytes    Other chronic medical conditions Hyperlipidemia: Continue Crestor CAD: Continue statin and Plavix Bilateral lower extremity edema:  Chronic and stable, holding Lasix for low blood pressure.  Consider diuresis later.   FEN/GI: Vegetarian diet, Protonix PPx: Home Eliquis Dispo:Pending PT recommendations  pending clinical improvement .   Subjective:  Patient reports that he is feeling much better today.  He is eating breakfast.  We discussed that the plan will be to treat his sepsis, transfuse if blood counts worsen.  Objective: Temp:  [97 F (36.1 C)-103.2 F (39.6 C)] 97.7 F (36.5 C) (10/11 1132) Pulse Rate:  [58-121] 75 (10/11 0945) Resp:  [11-27] 20 (10/11 0945) BP: (93-149)/(40-102) 126/50 (10/11 0945) SpO2:  [92 %-100 %] 97 % (10/11 0945) Weight:  [76.5 kg] 76.5 kg (10/10 1513) Physical Exam: General: Not in acute distress, eating breakfast Cardiovascular: RRR, no MRG Respiratory: CTAB, breathing comfortably on room air Abdomen: Bowel sounds present, soft, nontender nondistended Extremities: Pitting edema bilateral lower extremities. Neuro: Alert and oriented  x4, follows commands  Laboratory: Most recent CBC Lab Results  Component Value Date   WBC 17.5 (H) 11/07/2021   HGB 7.8 (L)  11/07/2021   HCT 22.9 (L) 11/07/2021   MCV 86.7 11/07/2021   PLT 332 11/07/2021   Most recent BMP    Latest Ref Rng & Units 11/07/2021    6:21 AM  BMP  Glucose 70 - 99 mg/dL 109   BUN 8 - 23 mg/dL 20   Creatinine 0.61 - 1.24 mg/dL 1.45   Sodium 135 - 145 mmol/L 139   Potassium 3.5 - 5.1 mmol/L 3.7   Chloride 98 - 111 mmol/L 106   CO2 22 - 32 mmol/L 25   Calcium 8.9 - 10.3 mg/dL 8.1    Leslie Dales, DO 11/07/2021, 12:10 PM  PGY-1, [place] Intern pager: [phone], text pages welcome Secure chat group [place]

## 2021-11-07 NOTE — Assessment & Plan Note (Addendum)
Stable. -Monitor O2 requirement

## 2021-11-07 NOTE — ED Notes (Signed)
Pt transported to [facility] 

## 2021-11-07 NOTE — Assessment & Plan Note (Deleted)
Bilateral lower leg swelling. Per patient and daughter report, it is stable and hasn't worsened. He takes PRN lasix for the swelling at home.  -B/l Vas U/S given hx of DVT  -If U/S neg, consider Ted hose for possible venous insufficiency -Hold Lasix 2/2 soft BP

## 2021-11-07 NOTE — Assessment & Plan Note (Deleted)
PCI with DES to LAD due to 80% stenosis of LAD in January. Continue statin and plavix.

## 2021-11-07 NOTE — Assessment & Plan Note (Signed)
>>  ASSESSMENT AND PLAN FOR NORMOCYTIC ANEMIA WRITTEN ON 11/10/2021  4:57 AM BY MAHMOOD, ATIF, MD  Hemolytic anemia. Hgb 8.0 today (from 9.9 yesterday). S/p 1u pRBC on 10/11. Consulted Dr. Shirline Frees with heme oncology-recommended stopping Eliquis and prednisone taper. Also gave IV Feraheme transfusion x1. Transfusion threshold 8. - prednisone taper per oncology recs - Daily CBC - Continue home iron

## 2021-11-07 NOTE — Progress Notes (Addendum)
FMTS Brief Progress Note- Late Entry  S: In to check on patient with Dr. Joeseph Amor after receiving page from RN that patient was appearing short of breath. He was accompanied by his wife in the room. Reported that he became diaphoretic when he sat up to eat dinner. He denied any shortness of breath, chest pain, nausea, chills. He states that he feels fine, but wasn't sure what caused him to break out into a sweat.   O: BP (!) 159/62 (BP Location: Right Arm)   Pulse 82   Temp (!) 97.5 F (36.4 C) (Oral)   Resp (!) 22   Wt 76.5 kg   SpO2 100%   BMI 23.52 kg/m   Gen: Elderly male sitting up in bed, diaphoretic but in NAD, pleasant  Resp: Expiratory wheezing b/l mid to lower lobes, increased expiratory phase, no crackles, normal work of breathing on room air   Card: RRR  A/P: E coli bacteremia  diaphoresis  Narrowed to CTX today. Continues to be afebrile and hemodynamically stable.  Diaphoresis may be from bacteremia though he does have significant cardiac hx with hx of MI, cardiac arrest so will evaluate for cardiac causes as well. -Trop, EKG -Continuous tele -Cont abx  Expiratory wheezing Denies SOB but with notable wheezing on examination.  -Duoneb q6h PRN SOB, wheezing; discussed with RN to provide neb treatment  -Monitor SpO2  Anemia Hemoglobin dropped below transfusion threshold. ?dilutional from his 3L boluses +IVF or potentially hemolytic component since he has hx of this. S/o 1UPRBC -F/u post H&H -Transfusion threshold <8 - Orders reviewed. Labs for AM ordered, which was adjusted as needed.   Sharion Settler, DO 11/07/2021, 8:52 PM PGY-3, [facility] Night Resident  Please page [phone] with questions.

## 2021-11-08 ENCOUNTER — Encounter: Payer: Self-pay | Admitting: Internal Medicine

## 2021-11-08 ENCOUNTER — Encounter (HOSPITAL_COMMUNITY): Payer: Self-pay | Admitting: Family Medicine

## 2021-11-08 ENCOUNTER — Other Ambulatory Visit: Payer: Self-pay

## 2021-11-08 DIAGNOSIS — J9601 Acute respiratory failure with hypoxia: Secondary | ICD-10-CM

## 2021-11-08 DIAGNOSIS — A4151 Sepsis due to Escherichia coli [E. coli]: Secondary | ICD-10-CM

## 2021-11-08 DIAGNOSIS — J189 Pneumonia, unspecified organism: Secondary | ICD-10-CM

## 2021-11-08 DIAGNOSIS — R6520 Severe sepsis without septic shock: Secondary | ICD-10-CM

## 2021-11-08 LAB — HAPTOGLOBIN: Haptoglobin: <10 mg/dL — ABNORMAL LOW (ref 38–329)

## 2021-11-08 LAB — TYPE AND SCREEN
ABO/RH(D): B NEG
Antibody Screen: NEGATIVE
Unit division: 0

## 2021-11-08 LAB — HEPATIC FUNCTION PANEL
ALT: 17 U/L (ref 0–44)
AST: 29 U/L (ref 15–41)
Albumin: 2.2 g/dL — ABNORMAL LOW (ref 3.5–5.0)
Alkaline Phosphatase: 68 U/L (ref 38–126)
Bilirubin, Direct: 0.4 mg/dL — ABNORMAL HIGH (ref 0.0–0.2)
Indirect Bilirubin: 0.8 mg/dL (ref 0.3–0.9)
Total Bilirubin: 1.2 mg/dL (ref 0.3–1.2)
Total Protein: 5.2 g/dL — ABNORMAL LOW (ref 6.5–8.1)

## 2021-11-08 LAB — BASIC METABOLIC PANEL
Anion gap: 9 (ref 5–15)
BUN: 20 mg/dL (ref 8–23)
CO2: 27 mmol/L (ref 22–32)
Calcium: 8.8 mg/dL — ABNORMAL LOW (ref 8.9–10.3)
Chloride: 106 mmol/L (ref 98–111)
Creatinine, Ser: 1.44 mg/dL — ABNORMAL HIGH (ref 0.61–1.24)
GFR, Estimated: 49 mL/min — ABNORMAL LOW (ref >60)
Glucose, Bld: 104 mg/dL — ABNORMAL HIGH (ref 70–99)
Potassium: 4 mmol/L (ref 3.5–5.1)
Sodium: 142 mmol/L (ref 135–145)

## 2021-11-08 LAB — CBC
HCT: 24.2 % — ABNORMAL LOW (ref 39.0–52.0)
Hemoglobin: 8.4 g/dL — ABNORMAL LOW (ref 13.0–17.0)
MCH: 28.5 pg (ref 26.0–34.0)
MCHC: 34.7 g/dL (ref 30.0–36.0)
MCV: 82 fL (ref 80.0–100.0)
Platelets: 310 10*3/uL (ref 150–400)
RBC: 2.95 MIL/uL — ABNORMAL LOW (ref 4.22–5.81)
RDW: 16.4 % — ABNORMAL HIGH (ref 11.5–15.5)
WBC: 11.5 10*3/uL — ABNORMAL HIGH (ref 4.0–10.5)
nRBC: 0 % (ref 0.0–0.2)

## 2021-11-08 LAB — TROPONIN I (HIGH SENSITIVITY): Troponin I (High Sensitivity): 340 ng/L (ref <18)

## 2021-11-08 LAB — URINE CULTURE: Culture: NO GROWTH

## 2021-11-08 LAB — BPAM RBC
Blood Product Expiration Date: 202311142359
ISSUE DATE / TIME: 202310111617
Unit Type and Rh: 1700

## 2021-11-08 MED ORDER — PREDNISONE 20 MG PO TABS: 40.0000 mg | ORAL_TABLET | Freq: Every day | ORAL | Status: DC

## 2021-11-08 MED ORDER — PREDNISONE 10 MG PO TABS: 10.0000 mg | ORAL_TABLET | Freq: Every day | ORAL | Status: DC

## 2021-11-08 MED ORDER — PREDNISONE 50 MG PO TABS: 60.0000 mg | ORAL_TABLET | Freq: Every day | ORAL | Status: DC

## 2021-11-08 MED ORDER — PREDNISONE 50 MG PO TABS
60.0000 mg | ORAL_TABLET | Freq: Every day | ORAL | Status: DC
  Administered 2021-11-08 – 2021-11-10 (×3): 60 mg via ORAL
  Filled 2021-11-08 (×3): qty 1

## 2021-11-08 MED ORDER — PREDNISONE 20 MG PO TABS: 20.0000 mg | ORAL_TABLET | Freq: Every day | ORAL | Status: DC

## 2021-11-08 NOTE — Progress Notes (Signed)
     Daily Progress Note Intern Pager: [phone]  Patient name: Arthur [name] Medical record number: [id number] Date of birth: Jul 03, 1940 Age: 81 y.o. Gender: male  Primary Care Provider: Lind Covert, MD Consultants: None Code Status: Full code  Pt Overview and Major Events to Date:  10/10: Admitted to FMTS 10/11: Hgb <8 on repeat, threshold 8, 1 unit pRBC  Assessment and Plan: [name] is an 81 year old male presenting with chills and cough, found to have E. coli bacteremia.  Significant medical history includes stage IV metastatic lung cancer, prior PE, DVT, MI requiring CPR.  * Sepsis ([place]) Blood culture positive for E. Coli. Source unknown. - Continue IV ceftriaxone - Continue cardiac monitoring - Continuous pulse oximetry - Supplemental O2, wean as tolerated - As needed Tylenol for pain and fever  Normocytic anemia Hemoglobin 8.4 this morning. S/p 1 unit pRBC. Transfusion threshold of 8.  Concern anticoagulation may be contributing, we had this discussion however no decisions have been made.  Patient and family agreeable to palliative to discuss. - Daily CBC - Some concern patient may be hemolyzing blood, we will consider reaching out to heme onc - Continue home iron  Hypoxia VQ scan had low risk of PE.  Patient breathing comfortably on room air. - Continuous pulse ox  Pleural effusion Repeat CXR, stable. -Monitor O2 requirement, if worsens consider diuresis  Adenocarcinoma of right lung, stage 4 (HCC) Minimal changes from prior imaging.  PT recommending home health. -Continue alectinib   FEN/GI: Vegetarian PPx: Eliquis Dispo:Home with home health pending clinical improvement .  Subjective:  Patient reports that he is feeling better today.  I had a discussion with him and daughter about seeing palliative, and they agreed.  We discussed that his anticoagulation may be in part contributing to anemia-they will think about it and have discussion  with palliative.  Objective: Temp:  [97.5 F (36.4 C)-98 F (36.7 C)] 97.5 F (36.4 C) (10/11 2047) Pulse Rate:  [70-82] 82 (10/11 2047) Resp:  [16-23] 22 (10/11 2047) BP: (112-159)/(47-65) 159/62 (10/11 2047) SpO2:  [94 %-100 %] 100 % (10/11 2047) Weight:  [77.6 kg] 77.6 kg (10/12 0500) Physical Exam: General: Not in acute distress, eating breakfast Cardiovascular: RRR, no MRG Respiratory: Expiratory wheezing, some crackles on left lower lung fields.  Normal work of breathing on room air Abdomen: Soft, nondistended, nontender Extremities: Pitting edema bilateral extremities  Laboratory: Most recent CBC Lab Results  Component Value Date   WBC 11.5 (H) 11/08/2021   HGB 8.4 (L) 11/08/2021   HCT 24.2 (L) 11/08/2021   MCV 82.0 11/08/2021   PLT 310 11/08/2021   Most recent BMP    Latest Ref Rng & Units 11/08/2021    3:49 AM  BMP  Glucose 70 - 99 mg/dL 104   BUN 8 - 23 mg/dL 20   Creatinine 0.61 - 1.24 mg/dL 1.44   Sodium 135 - 145 mmol/L 142   Potassium 3.5 - 5.1 mmol/L 4.0   Chloride 98 - 111 mmol/L 106   CO2 22 - 32 mmol/L 27   Calcium 8.9 - 10.3 mg/dL 8.8     Leslie Dales, DO 11/08/2021, 9:40 AM  PGY-1, [place] Intern pager: [phone], text pages welcome Secure chat group [place]

## 2021-11-08 NOTE — Progress Notes (Signed)
Palliative-   Consult received, chart reviewed. Called patient's son who is also his HCPOA in effort to schedule a time to meet. Vipul will call tomorrow with a time, he is unable to meet today.   Mariana Kaufman, AGNP-C Palliative Medicine  No charge

## 2021-11-08 NOTE — Progress Notes (Addendum)
I spoke with Dr. Julien Nordmann heme oncologist, who cares for Mr. Manuella Ghazi.  We discussed our concern of his anemia requiring blood transfusion.  Dr. Earlie Server recommended discontinuing Eliquis, which I did.  Additionally Dr. Earlie Server recommended starting 1 mg/kg taper of prednisone, and he will try to see patient in the hospital.  He will follow for [place].  We greatly appreciate Dr. Worthy Flank input, and welcome any further recommendations.

## 2021-11-08 NOTE — Progress Notes (Signed)
Mobility Specialist Progress Note:   11/08/21 1450  Mobility  Activity Ambulated with assistance in hallway  Level of Assistance Contact guard assist, steadying assist  Assistive Device Front wheel walker  Distance Ambulated (ft) 70 ft  Activity Response Tolerated fair  Mobility Referral Yes  $Mobility charge 1 Mobility   Pt agreeable to mobility session. Required CGA for safety. Pt displayed SOB with exertion, however denying any. Unable to get reliable SpO2 reading until EOS which was 94% once at rest for ~75min. Pt left in chair with all needs met.   Nelta Numbers Acute Rehab Secure Chat or Office Phone: [phone]

## 2021-11-09 ENCOUNTER — Other Ambulatory Visit (HOSPITAL_COMMUNITY): Payer: Self-pay

## 2021-11-09 DIAGNOSIS — D599 Acquired hemolytic anemia, unspecified: Secondary | ICD-10-CM

## 2021-11-09 DIAGNOSIS — Z7189 Other specified counseling: Secondary | ICD-10-CM

## 2021-11-09 DIAGNOSIS — C3491 Malignant neoplasm of unspecified part of right bronchus or lung: Secondary | ICD-10-CM

## 2021-11-09 DIAGNOSIS — C3490 Malignant neoplasm of unspecified part of unspecified bronchus or lung: Secondary | ICD-10-CM

## 2021-11-09 LAB — CULTURE, BLOOD (ROUTINE X 2): Special Requests: ADEQUATE

## 2021-11-09 LAB — CBC
HCT: 29.7 % — ABNORMAL LOW (ref 39.0–52.0)
Hemoglobin: 9.9 g/dL — ABNORMAL LOW (ref 13.0–17.0)
MCH: 27.7 pg (ref 26.0–34.0)
MCHC: 33.3 g/dL (ref 30.0–36.0)
MCV: 83.2 fL (ref 80.0–100.0)
Platelets: 350 10*3/uL (ref 150–400)
RBC: 3.57 MIL/uL — ABNORMAL LOW (ref 4.22–5.81)
RDW: 16.2 % — ABNORMAL HIGH (ref 11.5–15.5)
WBC: 12.1 10*3/uL — ABNORMAL HIGH (ref 4.0–10.5)
nRBC: 0 % (ref 0.0–0.2)

## 2021-11-09 LAB — BASIC METABOLIC PANEL
Anion gap: 9 (ref 5–15)
BUN: 19 mg/dL (ref 8–23)
CO2: 24 mmol/L (ref 22–32)
Calcium: 8.6 mg/dL — ABNORMAL LOW (ref 8.9–10.3)
Chloride: 105 mmol/L (ref 98–111)
Creatinine, Ser: 1.25 mg/dL — ABNORMAL HIGH (ref 0.61–1.24)
GFR, Estimated: 58 mL/min — ABNORMAL LOW (ref >60)
Glucose, Bld: 141 mg/dL — ABNORMAL HIGH (ref 70–99)
Potassium: 4.2 mmol/L (ref 3.5–5.1)
Sodium: 138 mmol/L (ref 135–145)

## 2021-11-09 LAB — PATHOLOGIST SMEAR REVIEW

## 2021-11-09 MED ORDER — SALINE SPRAY 0.65 % NA SOLN: 1.0000 | NASAL | Status: DC | PRN

## 2021-11-09 MED ORDER — CEFADROXIL 500 MG PO CAPS: 500.0000 mg | ORAL_CAPSULE | Freq: Two times a day (BID) | ORAL | Status: DC

## 2021-11-09 MED ORDER — SODIUM CHLORIDE 0.9 % IV SOLN
510.0000 mg | Freq: Once | INTRAVENOUS | Status: AC
  Administered 2021-11-09: 510 mg via INTRAVENOUS
  Filled 2021-11-09: qty 17

## 2021-11-09 MED ORDER — CEFADROXIL 500 MG PO CAPS
500.0000 mg | ORAL_CAPSULE | Freq: Two times a day (BID) | ORAL | Status: DC
  Administered 2021-11-10: 500 mg via ORAL
  Filled 2021-11-09: qty 1

## 2021-11-09 MED ORDER — CEFADROXIL 500 MG PO CAPS
500.0000 mg | ORAL_CAPSULE | Freq: Two times a day (BID) | ORAL | 0 refills | Status: AC
  Filled 2021-11-09: qty 6, 3d supply, fill #0

## 2021-11-09 NOTE — Consult Note (Signed)
Consultation Note Date: 11/09/2021   Patient Name: Arthur [name]  DOB: [birth year]  MRN: [id number]  Age / Sex: 81 y.o., male  PCP: Lind Covert, MD Referring Physician: Zenia Resides, MD  Reason for Consultation:  "discuss goals of care and blood thinners"   HPI/Patient Profile: 81 y.o. male  with past medical history of stage IV lung cancer (metastatic to bones) on treatment with Alectinib- PET in July indicated "excellent response", PE and DVT was on low dose Eliquis, and MI, admitted on 11/06/2021 with sepsis related to bacteremia. His hemoglobin dropped during admission and he has received 2 units of blood, he is being treated with IV ceftriaxone- there were concerns that anemia were related to his Eliquis and this has been stopped. Palliative medicine consulted for above.   Primary Decision Maker PATIENT =- he has HCPOA indicating his son Vipul Fournet to be his surrogate if her were unable to make decisions  Discussion: I have reviewed medical records including Care Everywhere, progress notes from this and prior admissions, labs and imaging.  On evaluation patient is looking well. He is sitting up in the chair at bedside and his lunch tray is empty. He is alert and oriented and able to participate in discussion.   I introduced Palliative Medicine as specialized medical care for people living with serious illness. It focuses on providing relief from the symptoms and stress of a serious illness. The goal is to improve quality of life for both the patient and the family.  We discussed a brief life review of the patient. He retired 2 years ago. He lives with his wife, son and daughter [place]   As far as functional and nutritional status prior to this acute illness he was getting around well at home utilizing a walker. Independent with his ADL's. Able to leave the house, but not able to drive.     We discussed patient's current illness and what it means in the larger context of patient's on-going co-morbidities.  Natural disease trajectory and expectations at EOL were discussed.  I attempted to elicit values and goals of care important to the patient.  Maintaining his independence and spending time with his family are of upmost importance.   Advance directives, concepts specific to code status, artificial feeding and hydration, and rehospitalization were considered and discussed. Mr. Breighner has Advanced Directives in his chart indicating his desire for natural death. He feel that his hospitalizations (although many this year) have been helpful in aiding him maintain his independence and quality of life and he would want to continue current interventions and hospitalizations if he were to decline again. He would not ever want to go to a nursing facility and if he declined to the point of needed 24 hour nursing care he would prefer to go home and be comfortable. Code status was discussed- he wishes to remain full code for now. He notes that he is very close with his primary care doctor- Dr. Erin Hearing and he has discussed this thoroughly with  him.   Discussed with patient/family the importance of continued conversation with family and the medical providers regarding overall plan of care and treatment options, ensuring decisions are within the context of the patient's values and GOCs.   We discussed his Eliquis- family and patient are in agreement with stopping Eliquis for now in hopes of improving his hemoglobin and allowing him to maintain his independence as he does get fatigued with low Hgb.   Questions and concerns were addressed. The family was encouraged to call with questions or concerns.    SUMMARY OF RECOMMENDATIONS -Continue current care- full scope, full code -GOC- maintain independence as long as possible -D/C home with medically stable per attending team     Code Status/Advance  Care Planning: Full code   Prognosis:   Unable to determine  Discharge Planning:  Home  Primary Diagnoses: Present on Admission:  Sepsis ([place])  Adenocarcinoma of right lung, stage 4 ([place])  Pneumonia   Review of Systems  All other systems reviewed and are negative.   Physical Exam Vitals and nursing note reviewed.  Cardiovascular:     Rate and Rhythm: Normal rate.  Pulmonary:     Effort: Pulmonary effort is normal.  Musculoskeletal:     Comments: BLE edema  Neurological:     General: No focal deficit present.     Mental Status: He is oriented to person, place, and time.  Psychiatric:        Mood and Affect: Mood normal.     Comments: Very pleasant     Vital Signs: BP (!) 133/49 (BP Location: Left Arm)   Pulse 79   Temp 97.7 F (36.5 C) (Oral)   Resp 18   Wt 76.9 kg   SpO2 96%   BMI 23.65 kg/m  Pain Scale: 0-10   Pain Score: 0-No pain   SpO2: SpO2: 96 % O2 Device:SpO2: 96 % O2 Flow Rate: .O2 Flow Rate (L/min): 2 L/min  IO: Intake/output summary:  Intake/Output Summary (Last 24 hours) at 11/09/2021 1339 Last data filed at 11/09/2021 0830 Gross per 24 hour  Intake 223 ml  Output 1250 ml  Net -1027 ml    LBM:   Baseline Weight: Weight: 76.5 kg Most recent weight: Weight: 76.9 kg       Thank you for this consult. Palliative medicine will continue to follow and assist as needed.   Greater than 50%  of this time was spent counseling and coordinating care related to the above assessment and plan.  Signed by: Mariana Kaufman, AGNP-C Palliative Medicine    Please contact Palliative Medicine Team phone at [phone] for questions and concerns.  For individual provider: See Shea Evans

## 2021-11-09 NOTE — Progress Notes (Signed)
Paged provider  telemetry called pt had run of bigeminal PVCs.  Previously has only had 1 or 2 at a time Tele called again just had  a quad and some cuplets

## 2021-11-09 NOTE — TOC Transition Note (Signed)
One (1) discharge med stored in the inpatient pharmacy until discharge.

## 2021-11-09 NOTE — Progress Notes (Signed)
OT Cancellation Note  Patient Details Name: Arthur [name] MRN: [id number] DOB: [birth year]   Cancelled Treatment:    Reason Eval/Treat Not Completed: Patient declined, no reason specified Checked back for OT session this PM with pt citing fatigue from earlier walk, declined ADLs as well. Will follow up on another day.  Layla Maw 11/09/2021, 2:01 PM

## 2021-11-09 NOTE — Progress Notes (Signed)
     Daily Progress Note Intern Pager: [phone]  Patient name: Arthur [name] Medical record number: [id number] Date of birth: [birth year] Age: 81 y.o. Gender: male  Primary Care Provider: Lind Covert, MD Consultants: Oncology Code Status: Full code  Pt Overview and Major Events to Date:  10/10: Admitted to FMTS 10/11: Hemoglobin <8 on repeat, threshold 8, 1 unit PRBC  Assessment and Plan: Arney R Sotomayor is an 81 year old male presenting with chills cough found to have E. coli bacteremia.  Significant medical history includes stage IV metastatic lung cancer, prior PE, DVT, MI requiring CPR.  Sepsis [facility]) Patient reports feeling 100% better today.  Leukocytosis trending down. - Continue IV ceftriaxone - Continue cardiac monitoring - Continuous pulse oximetry - As needed Tylenol for pain and fever  Normocytic anemia Hemoglobin 9.9 today. Haptoglobin came back at 10-concerning for hemolysis.  Consulted Dr. Earlie Server with heme oncology-recommended stopping Eliquis and prednisone taper.  Transfusion threshold 8. - Daily CBC - Continue home iron  Adenocarcinoma of right lung, stage 4 (HCC) Minimal changes from prior imaging.  PT recommending home health.  Palliative was able to speak with son, they are working towards a meeting. - Palliative consulted, appreciate continued recommendations - Continue alectinib  Hypoxia VQ scan had low risk of PE.  Patient breathing comfortably on room air. - Continuous pulse ox  Pleural effusion Stable. -Monitor O2 requirement   FEN/GI: Vegetarian PPx: DC'd Eliquis per oncology.  Can consider SCD. Dispo:Home with home health pending clinical improvement .   Subjective:  Patient reports that he feels 100% better.  He endorses nasal congestion, and is asking for nose spray-uses Vicks vapor rub at home.  Objective: Temp:  [97.3 F (36.3 C)-98.3 F (36.8 C)] 97.5 F (36.4 C) (10/13 0647) Pulse Rate:  [73-84] 73 (10/13  0647) Resp:  [17-20] 20 (10/13 0647) BP: (140-149)/(49-63) 149/63 (10/13 0647) SpO2:  [93 %-97 %] 94 % (10/13 0647) Weight:  [76.9 kg] 76.9 kg (10/13 0647) Physical Exam: General: Not in acute distress, chronically ill-appearing Cardiovascular: RRR, no MRG Respiratory: CTAB, normal work of breathing on room air Extremities: 2+ pitting edema bilateral lower extremities, unchanged from yesterday.  Laboratory: Most recent CBC Lab Results  Component Value Date   WBC 12.1 (H) 11/09/2021   HGB 9.9 (L) 11/09/2021   HCT 29.7 (L) 11/09/2021   MCV 83.2 11/09/2021   PLT 350 11/09/2021   Most recent BMP    Latest Ref Rng & Units 11/08/2021    3:49 AM  BMP  Glucose 70 - 99 mg/dL 104   BUN 8 - 23 mg/dL 20   Creatinine 0.61 - 1.24 mg/dL 1.44   Sodium 135 - 145 mmol/L 142   Potassium 3.5 - 5.1 mmol/L 4.0   Chloride 98 - 111 mmol/L 106   CO2 22 - 32 mmol/L 27   Calcium 8.9 - 10.3 mg/dL 8.8     Other pertinent labs  Haptoglobin: 10  Leslie Dales, DO 11/09/2021, 9:30 AM  PGY-1, [place] Intern pager: [phone], text pages welcome Secure chat group [place]

## 2021-11-09 NOTE — Progress Notes (Signed)
Mobility Specialist Progress Note:   11/09/21 1300  Mobility  Activity Ambulated with assistance in hallway  Level of Assistance Contact guard assist, steadying assist  Assistive Device Front wheel walker  Distance Ambulated (ft) 150 ft  Activity Response Tolerated well  Mobility Referral Yes  $Mobility charge 1 Mobility   Pt eager for mobility session today. SpO2 88%-94% on RA throughout ambulation. Pt left in chair with all needs met.   Nelta Numbers Acute Rehab Secure Chat or Office Phone: [phone]

## 2021-11-09 NOTE — TOC Initial Note (Signed)
Transition of Care [facility]) - Initial/Assessment Note    Patient Details  Name: Arthur [name] MRN: [id number] Date of Birth: 05-05-1940  Transition of Care [facility]) CM/SW Contact:    Tom-Johnson, Renea Ee, RN Phone Number: 11/09/2021, 2:57 PM  Clinical Narrative:                  CM spoke with patient at bedside about discharge needs. Admitted for Pneumonia, on room air, IV abx.  Oncology following for Lung Ca, Palliative following for GOC.  From home with son and daughter in-law, has three supportive children. Has a cane, walker, wheelchair and shower seat. PCP is Chambliss, Jeb Levering, MD and uses [facility] on [address].  Home health referral called in to [facility] per patient's request. Kerry Dory notified with acceptance voiced, info on AVS.  CM will continue to follow as patient progresses towards discharge.    Expected Discharge Plan: [place] Barriers to Discharge: Continued Medical Work up   Patient Goals and CMS Choice Patient states their goals for this hospitalization and ongoing recovery are:: To return home CMS Medicare.gov Compare Post Acute Care list provided to:: Patient Choice offered to / list presented to : Patient, Adult Children  Expected Discharge Plan and Services Expected Discharge Plan: [place]   Discharge Planning Services: CM Consult Post Acute Care Choice: [place] arrangements for the past 2 months: Single Family Home                 DME Arranged: N/A DME Agency: NA       HH Arranged: PT, OT HH Agency: [facility] Date [place] Agency Contacted: 11/09/21 Time HH Agency Contacted: [phone]    Prior Living Arrangements/Services Living arrangements for the past 2 months: Single Family Home Lives with:: Adult Children Patient language and need for interpreter reviewed:: Yes Do you feel safe going back to the place where you live?: Yes      Need for Family Participation in Patient Care:  Yes (Comment) Care giver support system in place?: Yes (comment) Current home services: DME (Cane. walker, wheelchair, shower chair.) Criminal Activity/Legal Involvement Pertinent to Current Situation/Hospitalization: No - Comment as needed  Activities of Daily Living      Permission Sought/Granted Permission sought to share information with : Case Manager, Customer service manager, Family Supports Permission granted to share information with : Yes, Verbal Permission Granted              Emotional Assessment Appearance:: Appears stated age Attitude/Demeanor/Rapport: Engaged, Gracious Affect (typically observed): Accepting, Appropriate, Calm, Hopeful, Pleasant Orientation: : Oriented to Self, Oriented to Place, Oriented to  Time, Oriented to Situation Alcohol / Substance Use: Not Applicable Psych Involvement: No (comment)  Admission diagnosis:  Pneumonia [J18.9] SIRS (systemic inflammatory response syndrome) (HCC) [R65.10] Acute respiratory failure with hypoxia (HCC) [J96.01] Sepsis ([place]) [A41.9] Patient Active Problem List   Diagnosis Date Noted   Acute respiratory failure with hypoxia (HCC)    Leg swelling 11/07/2021   Left rib fracture 11/07/2021   Pleural effusion 11/07/2021   Normocytic anemia 11/07/2021   Pneumonia 11/07/2021   Sepsis ([place]) 11/06/2021   Hypoxia 11/06/2021   Renal insufficiency 07/04/2021   Foot drop, left foot 07/04/2021   Thrombophlebitis arm 06/21/2021   Non-small cell carcinoma of lung, stage 4 ([place]) 06/15/2021   Encounter for antineoplastic chemotherapy 06/13/2021   Deep venous embolism and thrombosis ([place]) 05/16/2021   Groin swelling 05/16/2021   Adenocarcinoma of  right lung, stage 4 ([place]) 04/09/2021   Cerebral embolism with cerebral infarction 03/18/2021   Adenopathy 03/06/2021   History of cardiac arrest 02/19/2021   Status post coronary artery stent placement    Coronary artery disease involving native heart    Pulmonary  nodules/lesions, multiple    Pulmonary embolus ([place]) 02/03/2021   Anemia 09/06/2015   GERD (gastroesophageal reflux disease) 07/05/2013   Arthritis 02/26/2010   Gout 11/06/2009   Diabetes mellitus, type II ([place]) 09/22/2009   Hyperlipidemia 09/22/2009   Essential hypertension, benign 09/22/2009   PCP:  Lind Covert, MD Pharmacy:   [place], [place] [place] [address] Phone: [phone] Fax: [phone]  [facility] ORDER) [place], [place] [address] Phone: [phone] Fax: [phone]  [facility] [address] [place] [address] Phone: [phone] Fax: [place] [address] Phone: [phone] Fax: [phone]  [place] [address], [place] [address] [place] [address] Phone: [phone] Fax: [phone]     Social Determinants of Health (SDOH) Interventions    Readmission Risk Interventions    06/19/2021    4:57 PM  Readmission Risk Prevention Plan  Transportation Screening Complete  PCP or Specialist Appt within 3-5 Days Complete  HRI or [place] Complete  Social Work Consult for [place] Planning/Counseling Complete  Medication Review Press photographer) Complete

## 2021-11-09 NOTE — Progress Notes (Signed)
OT Cancellation Note  Patient Details Name: Arthur [name] MRN: [id number] DOB: 1940/10/30   Cancelled Treatment:    Reason Eval/Treat Not Completed: Patient declined, no reason specified Pt politely declined OT session this AM and requested OT to follow up after breakfast (estimated tray arrival @ 8:30AM per pt).  Layla Maw 11/09/2021, 7:33 AM

## 2021-11-09 NOTE — Progress Notes (Signed)
DIAGNOSIS:  1) Stage IV (T1b, N3, M1 C) non-small cell lung cancer, adenocarcinoma with positive ALK gene translocation diagnosed in February 2023. 2) deep venous thrombosis of the left lower extremity   PRIOR THERAPY: None   CURRENT THERAPY:  1) Alecensa (Alectinib) 600 mg p.o. twice daily.  First dose April 11, 2021.  Status post 6 months of treatment. 2) Eliquis 2.5 mg p.o. twice daily.  First dose 10/11/2021  Subjective: The patient is seen and examined today.  He is feeling a little bit better after receiving 2 units of PRBCs transfusion yesterday.  He is currently off Eliquis.  He presented to the hospital complaining of chills and sweats as well as cough and fever that has been going on for few days.  He was also hypotensive and tachycardic.  He had CT scan of the chest, abdomen and pelvis performed on admission that showed a small bilateral pleural effusion minimally increased in size with mild body wall edema otherwise no acute findings.  He also had similar interstitial thickening in the mid lower lung zones represent chronic edema and the previously stable and known widespread bony metastatic disease but no concerning finding for disease progression.  His lab work showed elevated white blood count of 17.3 with hemoglobin of 8.4 and hematocrit 24.7 and normal platelets count.  The patient was admitted to the hospital and received 2 units of PRBCs transfusion.  His blood culture showed gram-negative rods.  BNP was elevated at 782.9.  Haptoglobin was less than 10 similar to several months ago and the peripheral blood smear will showed burr cells with schistocytes and acanthocytes present.  LDH is slightly elevated at 249 and the patient has low normal iron of 31 with iron saturation of 14%.  He was also found to have significant elevation of his troponin level at 360.  Objective: Vital signs in last 24 hours: Temp:  [97.3 F (36.3 C)-98.1 F (36.7 C)] 97.7 F (36.5 C) (10/13 0937) Pulse  Rate:  [73-80] 79 (10/13 0937) Resp:  [18-20] 18 (10/13 0937) BP: (133-149)/(49-63) 133/49 (10/13 0937) SpO2:  [94 %-97 %] 96 % (10/13 0937) Weight:  [169 lb 8.5 oz (76.9 kg)] 169 lb 8.5 oz (76.9 kg) (10/13 0647)  Intake/Output from previous day: 10/12 0701 - 10/13 0700 In: 680 [P.O.:680] Out: 1650 [Urine:1650] Intake/Output this shift: No intake/output data recorded.  General appearance: alert, cooperative, fatigued, and no distress Resp: rales bilaterally Cardio: regular rate and rhythm, S1, S2 normal, no murmur, click, rub or gallop GI: soft, non-tender; bowel sounds normal; no masses,  no organomegaly Extremities: edema 1+ edema bilaterally  Lab Results:  Recent Labs    11/08/21 0349 11/09/21 0827  WBC 11.5* 12.1*  HGB 8.4* 9.9*  HCT 24.2* 29.7*  PLT 310 350   BMET Recent Labs    11/07/21 0621 11/08/21 0349  NA 139 142  K 3.7 4.0  CL 106 106  CO2 25 27  GLUCOSE 109* 104*  BUN 20 20  CREATININE 1.45* 1.44*  CALCIUM 8.1* 8.8*    Studies/Results: NM Pulmonary Perfusion  Result Date: 11/07/2021 CLINICAL DATA:  High probability for pulmonary embolism. Hypoxia. History of cancer, PE, and DVT. EXAM: NUCLEAR MEDICINE PERFUSION LUNG SCAN TECHNIQUE: Perfusion images were obtained in multiple projections after intravenous injection of radiopharmaceutical. Ventilation scans intentionally deferred if perfusion scan and chest x-ray adequate for interpretation during COVID 19 epidemic. RADIOPHARMACEUTICALS:  3.8 mCi Tc-32m MAA IV COMPARISON:  Same day chest radiograph at 0702 hours; CT chest  11/06/2021 FINDINGS: Comparison chest radiograph demonstrates enlargement of the cardiac silhouette and bilateral small layering pleural effusions with associated subsegmental bibasilar atelectasis. Perfusion imaging demonstrates subsegmental perfusion abnormalities in the anterior segment of the right upper lobe and superior segment of the right lower lobe. Non segmental perfusion defects at  the costophrenic angles and within the fissures, corresponding to pleural fluid on chest radiograph and CT chest. Additional non segmental perfusion defect at the apical segment of the right upper lobe secondary to pleural thickening at the right apex, seen on CT chest 11/06/2021. IMPRESSION: Two subsegmental perfusion abnormalities in the right lung with additional non-segmental perfusion defects related to pleural fluid and pleural thickening seen on recent chest imaging. Per modified PIOPED II criteria, findings are very low probability for pulmonary embolism. Electronically Signed   By: Ileana Roup M.D.   On: 11/07/2021 13:57    Medications: I have reviewed the patient's current medications.   Assessment/Plan: This is a very pleasant 81 years old [place] male with: 1) stage IV non-small cell lung cancer, adenocarcinoma with positive ALK gene translocation and currently on treatment with Alecensa (Alectinib) 600 mg p.o. twice daily and has been tolerating this treatment well with no concerning adverse effects and he has significant improvement in his disease on the most recent imaging studies.  I recommended for the patient to continue his current treatment with Alecensa (Alectinib) with the same dose.  We will continue to monitor his blood work closely. 2) persistent anemia consistent with anemia of chronic disease/hemolytic anemia with low haptoglobin and slightly elevated LDH and few schistocytes on the peripheral blood smear.  The etiology of his hemolytic anemia is unclear but it could be secondary to medications or his cardiac condition.  I do not see any clear signs of TTP as the patient has normal platelets count and his LDH are not significantly high with few schistocytes. I will start the patient on a tapered dose of prednisone starting at 60 mg p.o. daily for 1 week followed by 40 mg p.o. daily for 1 week followed by 20 mg p.o. daily for 1 week followed by 10 mg p.o. daily for 1 week.  I will  check his quantitative immunoglobulin and consider The patient for treatment with IVIG if needed.  I will also arrange for the patient to receive iron infusion with Feraheme during his hospitalization because of the iron deficiency. 3) sepsis of unclear etiology: I agree with the current treatment with Duricef and ceftriaxone by the primary team.  The patient may benefit from ID evaluation or cardiac evaluation to rule out any underlying endocarditis especially with his recurrent pulmonary edema and elevated troponin level. 4) history of deep venous thrombosis and pulmonary embolism: The patient has been on treatment with Eliquis 2.5 mg p.o. twice daily in addition to Plavix 75 mg p.o. daily.  I would hold his treatment with Eliquis for now but will continue his treatment with Plavix. 5) goals of care: The patient has stage IV lung cancer but with ALK gene translocation.  Survival from the lung cancer with this mutation is usually several years and I would recommend continuing his treatment but addressing the other comorbidities. Thank you for taking good care of Mr. Stucky.  I will continue to follow-up the patient with you and assist in his management on as-needed basis.     LOS: 3 days    Eilleen Kempf 11/09/2021

## 2021-11-09 NOTE — Progress Notes (Signed)
PT Cancellation Note  Patient Details Name: Arthur [name] MRN: [id number] DOB: 18-Oct-1940   Cancelled Treatment:    Reason Eval/Treat Not Completed: (P) Patient declined. Pt request no PT today,reports he walked eariler today. PT will follow back at a later time.  Leronda Lewers B. Migdalia Dk PT, DPT Acute Rehabilitation Services Please use secure chat or  Call Office [phone]   [place] 11/09/2021, 4:07 PM

## 2021-11-09 NOTE — Plan of Care (Signed)
  Problem: Activity: Goal: Risk for activity intolerance will decrease Outcome: Progressing   Problem: Nutrition: Goal: Adequate nutrition will be maintained Outcome: Progressing   Problem: Elimination: Goal: Will not experience complications related to urinary retention Outcome: Progressing   

## 2021-11-10 LAB — CBC
HCT: 22.9 % — ABNORMAL LOW (ref 39.0–52.0)
Hemoglobin: 8 g/dL — ABNORMAL LOW (ref 13.0–17.0)
MCH: 28.7 pg (ref 26.0–34.0)
MCHC: 34.9 g/dL (ref 30.0–36.0)
MCV: 82.1 fL (ref 80.0–100.0)
Platelets: 298 10*3/uL (ref 150–400)
RBC: 2.79 MIL/uL — ABNORMAL LOW (ref 4.22–5.81)
RDW: 16 % — ABNORMAL HIGH (ref 11.5–15.5)
WBC: 8.9 10*3/uL (ref 4.0–10.5)
nRBC: 0 % (ref 0.0–0.2)

## 2021-11-10 LAB — BASIC METABOLIC PANEL
Anion gap: 6 (ref 5–15)
BUN: 22 mg/dL (ref 8–23)
CO2: 27 mmol/L (ref 22–32)
Calcium: 8.7 mg/dL — ABNORMAL LOW (ref 8.9–10.3)
Chloride: 106 mmol/L (ref 98–111)
Creatinine, Ser: 1.22 mg/dL (ref 0.61–1.24)
GFR, Estimated: 60 mL/min — ABNORMAL LOW (ref >60)
Glucose, Bld: 235 mg/dL — ABNORMAL HIGH (ref 70–99)
Potassium: 3.2 mmol/L — ABNORMAL LOW (ref 3.5–5.1)
Sodium: 139 mmol/L (ref 135–145)

## 2021-11-10 LAB — IGG, IGA, IGM
IgA: 317 mg/dL (ref 61–437)
IgG (Immunoglobin G), Serum: 1171 mg/dL (ref 603–1613)
IgM (Immunoglobulin M), Srm: 23 mg/dL (ref 15–143)

## 2021-11-10 MED ORDER — PREDNISONE 20 MG PO TABS: 20.0000 mg | ORAL_TABLET | Freq: Every day | ORAL | 0 refills | Status: AC

## 2021-11-10 MED ORDER — PREDNISONE 20 MG PO TABS: 40.0000 mg | ORAL_TABLET | Freq: Every day | ORAL | 0 refills | Status: AC

## 2021-11-10 MED ORDER — POTASSIUM CHLORIDE 20 MEQ PO PACK
40.0000 meq | PACK | Freq: Two times a day (BID) | ORAL | Status: DC
  Administered 2021-11-10: 40 meq via ORAL
  Filled 2021-11-10: qty 2

## 2021-11-10 MED ORDER — PREDNISONE 20 MG PO TABS: 60.0000 mg | ORAL_TABLET | Freq: Every day | ORAL | 0 refills | Status: AC

## 2021-11-10 MED ORDER — PREDNISONE 10 MG PO TABS: 10.0000 mg | ORAL_TABLET | Freq: Every day | ORAL | 0 refills | Status: AC

## 2021-11-10 NOTE — Discharge Summary (Addendum)
[facility] Discharge Summary  Patient name: Arthur [name] Medical record number: [id number] Date of birth: Aug 27, 1940 Age: 81 y.o. Gender: male Date of Admission: 11/06/2021  Date of Discharge: 11/10/21 Admitting Physician: August Albino, MD  Primary Care Provider: Lind Covert, MD Consultants: Oncology  Indication for Hospitalization: Sepsis, E. Coli bacteremia, hemolytic anemia  Discharge Diagnoses/Problem List:  Principal Problem for Admission: Pneumonia Other Problems addressed during stay:  Principal Problem:   Pneumonia Active Problems:   Sepsis ([place])   Normocytic anemia   Adenocarcinoma of right lung, stage 4 (HCC)   Hypoxia   Pleural effusion   Acute respiratory failure with hypoxia [facility])   Brief Hospital Course:  Arthur [name] is a 81 year old male who presented with chills and sweats for 1 day, cough for 3 days and fever.  Was hypotensive and tachycardic on admission concerning for SIRS.  Blood cultures positive for E. coli bacteremia.  Significant past medical history of stage IV metastatic lung cancer, prior PE, chronic DVTs.  His hospital course outlined below.  Sepsis [name] blood cultures came back positive for E. coli bacteremia.  Susceptibilities showed pan susceptible.  He was started on 3 days of IV ceftriaxone, step down to cefadroxil on day 3 of admission.  Definitive source was not identifiable during this admission.  If patient has another episode of E. coli bacteremia, would recommend more thorough evaluation for source control.  Patient was afebrile and stable at time of discharge.  Hypoxia Patient had initial new oxygen requirement of 2 L oxygen, he does not use oxygen at home.  He was noted to have small bilateral pleural effusions on CT chest/abdomen/pelvis which had minimally increased from his prior CT scan several months ago. VQ scan was also performed, low probability for PE.  Patient improved  clinically, successfully weaned off oxygen and stable on room air.  Normocytic anemia Patient has history of normocytic anemia, requiring iron.  Additionally he has a hematoma in left quadricep, which is stable.  He has a history of worsening bleeding with Lovenox, and was placed on Eliquis several weeks ago. He also has a history of hemolysis for which he was treated with prednisone.  He follows up with heme oncology for stage IV right lung cancer (see below).  Due to CAD and previous MI requiring CPR, his threshold to transfuse is 8.  Patient had hemoglobin less than 8 requiring transfusion, he received 1 unit of pRBC.  On work-up he was found to have hemolysis evidenced by increased bilirubin, low haptoglobin, elevated LDH and anemia.  Heme oncology was consulted, they recommended stopping Eliquis and starting a prednisone taper. Also received a dose of IV iron during hospitalization. [name] hemoglobin stabilized during admission, discharged home with taper in good condition.  Adenocarcinoma right lung, stage IV [facility]) Patient follows with Dr. Earlie Server heme/oncology.  He was continued on alectinib for his adenocarcinoma.  Imaging did not show progression of metastases.  Dr. Earlie Server was consulted for concerns of anemia (see above).  All other chronic medical conditions were treated appropriately and stable time of discharge.  Follow up recommendations: Ensure completion of Duricef course and prednisone taper Repeat CBC to check Hgb. Hgb was 8 on day of discharge. CT chest w nonobstructing R renal stone and gallstones. Continue to monitor clinically Ensure follow up with heme/onc, scheduled for 10/18 Patient may benefit from continued [place] discussions. Evaluated by palliative during his stay. Remains full-code.   IMAGING   CXR IMPRESSION: 1.  No acute cardiopulmonary abnormality. 2. Multiple left rib fractures. Chronic blunting left costophrenic angle.    CT ABD/PELV WO  CONTRAST IMPRESSION: 1. Small bilateral pleural effusions have minimally increased in size from prior chest CT. 2. Mild body wall edema. Otherwise no acute findings in the chest, abdomen, or pelvis. 3. Right pleural thickening is again seen. Extrapleural low-density in the right hemithorax is diminished from prior chest CT, and was not hypermetabolic on prior PET. Right lower lobe pulmonary nodule is unchanged allowing for differences in caliper placement. Attention to this at follow-up recommended. 4. Similar interstitial thickening in the mid lower lung zones, may represent chronic edema. 5. Widespread bony metastatic disease, grossly stable from prior imaging. Mild L4 inferior endplate compression fracture was present on 10/28/2021 lumbar MRI. 6. Right inguinal hernia containing small bowel loops, not entirely included in the field of view. No evidence of obstruction or inflammation. 7. Nonobstructing right nephrolithiasis. 8. Incidental note of gallstones or sludge in the dependent gallbladder without evidence of cholecystitis.   Aortic Atherosclerosis (ICD10-I70.0) and Emphysema (ICD10-J43.9).   VQ SCAN IMPRESSION: Two subsegmental perfusion abnormalities in the right lung with additional non-segmental perfusion defects related to pleural fluid and pleural thickening seen on recent chest imaging. Per modified PIOPED II criteria, findings are very low probability for pulmonary embolism.  Disposition: home with home health PT/OT  Discharge Condition: stable, improved   Discharge Exam:  Vitals:   11/09/21 2230 11/10/21 0559  BP: (!) 145/48 (!) 148/56  Pulse: 69 71  Resp: 18 18  Temp: 97.9 F (36.6 C) (!) 97.3 F (36.3 C)  SpO2: 96% 95%   Gen: NAD, alert and conversational CV: RRR no MRG Pulm: CTAB normal WOB on RA Ext: b/l lower extremity pitting edema, stable from prior exam  Significant Procedures: n/a  Significant Labs and Imaging:  Recent Labs  Lab  11/09/21 0827 11/10/21 0354  WBC 12.1* 8.9  HGB 9.9* 8.0*  HCT 29.7* 22.9*  PLT 350 298   Recent Labs  Lab 11/09/21 0827 11/10/21 0354  NA 138 139  K 4.2 3.2*  CL 105 106  CO2 24 27  GLUCOSE 141* 235*  BUN 19 22  CREATININE 1.25* 1.22  CALCIUM 8.6* 8.7*    Results/Tests Pending at Time of Discharge: n/a  Discharge Medications:  Allergies as of 11/10/2021       Reactions   Ace Inhibitors    REACTION: Cough   Iohexol    Severe ATN after CT with contrast May 2023 - creatinine peaked near 8. Did not require HD.        Medication List     STOP taking these medications    apixaban 2.5 MG Tabs tablet Commonly known as: Eliquis       TAKE these medications    acetaminophen 325 MG suppository Commonly known as: TYLENOL Place 325 mg rectally every 4 (four) hours as needed for mild pain or moderate pain.   Alecensa 150 MG capsule Generic drug: alectinib Take 4 capsules (600 mg total) by mouth 2 (two) times daily with a meal.   cefadroxil 500 MG capsule Commonly known as: DURICEF Take 1 capsule (500 mg total) by mouth 2 (two) times daily for 3 days.   clopidogrel 75 MG tablet Commonly known as: PLAVIX Take 1 tablet (75 mg total) by mouth daily.   furosemide 20 MG tablet Commonly known as: LASIX 1 tablet p.o. daily on as-needed basis for swelling of the lower extremities.   Fusion Plus Caps  Take 1 capsule by mouth daily.   omeprazole 20 MG capsule Commonly known as: PRILOSEC Take 1 capsule (20 mg total) by mouth daily as needed. What changed:  when to take this reasons to take this   predniSONE 20 MG tablet Commonly known as: DELTASONE Take 3 tablets (60 mg total) by mouth daily with breakfast for 4 days. Start taking on: November 11, 2021   predniSONE 20 MG tablet Commonly known as: DELTASONE Take 2 tablets (40 mg total) by mouth daily with breakfast for 7 days. Start taking on: November 15, 2021   predniSONE 20 MG tablet Commonly known as:  DELTASONE Take 1 tablet (20 mg total) by mouth daily with breakfast for 7 days. Start taking on: November 22, 2021   predniSONE 10 MG tablet Commonly known as: DELTASONE Take 1 tablet (10 mg total) by mouth daily with breakfast for 7 days. Start taking on: November 29, 2021   rosuvastatin 5 MG tablet Commonly known as: CRESTOR Take 1 tablet by mouth once daily        Discharge Instructions: Please refer to Patient Instructions section of EMR for full details.  Patient was counseled important signs and symptoms that should prompt return to medical care, changes in medications, dietary instructions, activity restrictions, and follow up appointments.   Follow-Up Appointments:  Future Appointments  Date Time Provider [place]  11/13/2021 10:30 AM Lind Covert, MD [facility]  11/14/2021  8:15 AM [facility] None  11/14/2021  8:45 AM Curt Bears, MD [facility] None      Follow-up [place] [place] The Follow up.   Specialty: Home Health Services Why: Someone will call you to schedule first home visit. Contact information: [place] [address] [phone]         Lind Covert, MD. Go on 11/13/2021.   Specialty: Family Medicine Why: Please go to your scheduled appointment at 10:30 AM.  Please try to arrive at least 15 minutes early and bring your medications. Contact information: [place] [address] [phone]                 August Albino, MD 11/10/2021, 6:48 AM PGY-1, [place] Upper-Level Resident Addendum   I have independently interviewed and examined the patient. I have discussed the above with the original author and agree with their documentation. My edits for correction/addition/clarification are included where appropriate. Please see also any attending notes.   Sharion Settler,  DO PGY-3, [facility] 11/10/2021 6:54 AM  FPTS Service pager: [phone] (text pages welcome through [facility])

## 2021-11-10 NOTE — Progress Notes (Signed)
Patient is being discharged home. All property was returned including medications stored in pharmacy. Discharge instructions reviewed with patient and patients daughter. Medications are available to pick up at [facility]. Patient and family verbalized a full understanding of all directions given including new medications. Patients daughter is his ride home.

## 2021-11-10 NOTE — TOC Transition Note (Addendum)
Transition of Care [facility] [place]) - CM/SW Discharge Note   Patient Details  Name: Arthur [name] MRN: [id number] Date of Birth: 01-04-1941  Transition of Care [facility]) CM/SW Contact:  Bartholomew Crews, RN Phone Number: [phone] 11/10/2021, 9:19 AM   Clinical Narrative:     Patient to transition home today. Previous RNCM arranged for [facility] with [facility] - hospital liaison at [facility] notified of transition today, [place] orders in place. No further TOC needs identified at this time.   Final next level of care: [place] Barriers to Discharge: No Barriers Identified   Patient Goals and CMS Choice Patient states their goals for this hospitalization and ongoing recovery are:: To return home CMS Medicare.gov Compare Post Acute Care list provided to:: Patient Choice offered to / list presented to : Patient, Adult Children  Discharge Placement                       Discharge Plan and Services   Discharge Planning Services: CM Consult Post Acute Care Choice: Home Health          DME Arranged: N/A DME Agency: NA       HH Arranged: PT, OT HH Agency: [facility] Date [place]: 11/10/21 Time [place]: [phone] Representative spoke with at [place]: Reinaldo Raddle (secure email sent to advise of DC)  Social Determinants of Health (SDOH) Interventions     Readmission Risk Interventions    06/19/2021    4:57 PM  Readmission Risk Prevention Plan  Transportation Screening Complete  PCP or Specialist Appt within 3-5 Days Complete  HRI or [place] Complete  Social Work Consult for [place] Planning/Counseling Complete  Medication Review Press photographer) Complete

## 2021-11-10 NOTE — Discharge Instructions (Addendum)
Dear Arthur [name],  Thank you for letting us participate in your care. You were hospitalized for Pneumonia. You were treated with antibiotics.  You were sent home with an oral antibiotic, as well as an oral steroid. Your Eliquis was discontinued.   POST-HOSPITAL & CARE INSTRUCTIONS Please review your updated medication list for specific instructions on which medications to take Go to your follow up appointments (listed below)   DOCTOR'S APPOINTMENT   Future Appointments  Date Time Provider [place]  11/13/2021 10:30 AM Lind Covert, MD [facility]  11/14/2021  8:15 AM [facility] None  11/14/2021  8:45 AM Curt Bears, MD [facility] None    Follow-up [place] Follow up.   Specialty: Home Health Services Why: Someone will call you to schedule first home visit. Contact information: [place] [address] [phone]                 Take care and be well!  [facility]  [place], [place] [address] [phone]

## 2021-11-12 ENCOUNTER — Other Ambulatory Visit (HOSPITAL_COMMUNITY): Payer: Self-pay

## 2021-11-12 ENCOUNTER — Telehealth: Payer: Self-pay

## 2021-11-12 DIAGNOSIS — D630 Anemia in neoplastic disease: Secondary | ICD-10-CM | POA: Diagnosis not present

## 2021-11-12 DIAGNOSIS — I2510 Atherosclerotic heart disease of native coronary artery without angina pectoris: Secondary | ICD-10-CM | POA: Diagnosis not present

## 2021-11-12 DIAGNOSIS — M109 Gout, unspecified: Secondary | ICD-10-CM | POA: Diagnosis not present

## 2021-11-12 DIAGNOSIS — Z86718 Personal history of other venous thrombosis and embolism: Secondary | ICD-10-CM | POA: Diagnosis not present

## 2021-11-12 DIAGNOSIS — I252 Old myocardial infarction: Secondary | ICD-10-CM | POA: Diagnosis not present

## 2021-11-12 DIAGNOSIS — M21372 Foot drop, left foot: Secondary | ICD-10-CM | POA: Diagnosis not present

## 2021-11-12 DIAGNOSIS — C3491 Malignant neoplasm of unspecified part of right bronchus or lung: Secondary | ICD-10-CM | POA: Diagnosis not present

## 2021-11-12 DIAGNOSIS — I69990 Apraxia following unspecified cerebrovascular disease: Secondary | ICD-10-CM | POA: Diagnosis not present

## 2021-11-12 DIAGNOSIS — M1990 Unspecified osteoarthritis, unspecified site: Secondary | ICD-10-CM | POA: Diagnosis not present

## 2021-11-12 DIAGNOSIS — Z9181 History of falling: Secondary | ICD-10-CM | POA: Diagnosis not present

## 2021-11-12 DIAGNOSIS — I10 Essential (primary) hypertension: Secondary | ICD-10-CM | POA: Diagnosis not present

## 2021-11-12 DIAGNOSIS — Z7902 Long term (current) use of antithrombotics/antiplatelets: Secondary | ICD-10-CM | POA: Diagnosis not present

## 2021-11-12 DIAGNOSIS — I69354 Hemiplegia and hemiparesis following cerebral infarction affecting left non-dominant side: Secondary | ICD-10-CM | POA: Diagnosis not present

## 2021-11-12 DIAGNOSIS — E1142 Type 2 diabetes mellitus with diabetic polyneuropathy: Secondary | ICD-10-CM | POA: Diagnosis not present

## 2021-11-12 NOTE — Patient Outreach (Signed)
  Care Coordination Specialists [facility] Note Transition Care Management Follow-up Telephone Call Date of discharge and from where: [facility] 11/06/21-11/10/21 How have you been since you were released from the hospital? Per patients son, Vitul, patient is doing well. Any questions or concerns? No  Items Reviewed: Did the pt receive and understand the discharge instructions provided? Yes  Medications obtained and verified? Yes  Other? No  Any new allergies since your discharge? No  Dietary orders reviewed? No Do you have support at home? Yes   Home Care and Equipment/Supplies: Were home health services ordered? yes If so, what is the name of the agency? [facility]  Has the agency set up a time to come to the patient's home? yes Were any new equipment or medical supplies ordered?  No What is the name of the medical supply agency? N/A Were you able to get the supplies/equipment? not applicable Do you have any questions related to the use of the equipment or supplies? No  Functional Questionnaire: (I = Independent and D = Dependent) ADLs: Needs assistance  Bathing/Dressing- Needs assistance  Meal Prep- D  Eating- I  Maintaining continence- I  Transferring/Ambulation- Needs assistance  Managing Meds- D  Follow up appointments reviewed:  PCP Hospital f/u appt confirmed? Yes  Scheduled to see Dr. Erin Hearing on 11/13/21 @ 10:30. [place] [facility] f/u appt confirmed? No   Are transportation arrangements needed? No  If their condition worsens, is the pt aware to call PCP or go to the Emergency Dept.? Yes Was the patient provided with contact information for the PCP's office or ED? Yes Was to pt encouraged to call back with questions or concerns? Yes  SDOH assessments and interventions completed:   Yes  Care Coordination Interventions Activated:  Yes   Care Coordination Interventions:  No Care Coordination interventions needed at this time.   Encounter Outcome:  Pt. Visit Completed

## 2021-11-13 ENCOUNTER — Inpatient Hospital Stay: Payer: Medicare Other | Admitting: Family Medicine

## 2021-11-14 ENCOUNTER — Inpatient Hospital Stay (HOSPITAL_BASED_OUTPATIENT_CLINIC_OR_DEPARTMENT_OTHER): Payer: Medicare Other | Admitting: Internal Medicine

## 2021-11-14 ENCOUNTER — Inpatient Hospital Stay: Payer: Medicare Other | Attending: Physician Assistant

## 2021-11-14 ENCOUNTER — Other Ambulatory Visit: Payer: Self-pay

## 2021-11-14 VITALS — BP 157/59 | HR 77 | Temp 97.6°F | Resp 17 | Ht 71.0 in | Wt 173.3 lb

## 2021-11-14 DIAGNOSIS — C7951 Secondary malignant neoplasm of bone: Secondary | ICD-10-CM | POA: Diagnosis not present

## 2021-11-14 DIAGNOSIS — Z7901 Long term (current) use of anticoagulants: Secondary | ICD-10-CM | POA: Insufficient documentation

## 2021-11-14 DIAGNOSIS — Z86718 Personal history of other venous thrombosis and embolism: Secondary | ICD-10-CM | POA: Insufficient documentation

## 2021-11-14 DIAGNOSIS — C3490 Malignant neoplasm of unspecified part of unspecified bronchus or lung: Secondary | ICD-10-CM | POA: Insufficient documentation

## 2021-11-14 DIAGNOSIS — Z86711 Personal history of pulmonary embolism: Secondary | ICD-10-CM | POA: Insufficient documentation

## 2021-11-14 DIAGNOSIS — D500 Iron deficiency anemia secondary to blood loss (chronic): Secondary | ICD-10-CM

## 2021-11-14 DIAGNOSIS — D509 Iron deficiency anemia, unspecified: Secondary | ICD-10-CM | POA: Insufficient documentation

## 2021-11-14 DIAGNOSIS — Z955 Presence of coronary angioplasty implant and graft: Secondary | ICD-10-CM | POA: Diagnosis not present

## 2021-11-14 DIAGNOSIS — D589 Hereditary hemolytic anemia, unspecified: Secondary | ICD-10-CM | POA: Diagnosis not present

## 2021-11-14 DIAGNOSIS — C3491 Malignant neoplasm of unspecified part of right bronchus or lung: Secondary | ICD-10-CM

## 2021-11-14 DIAGNOSIS — Z79899 Other long term (current) drug therapy: Secondary | ICD-10-CM | POA: Diagnosis not present

## 2021-11-14 LAB — CBC WITH DIFFERENTIAL (CANCER CENTER ONLY)
Abs Immature Granulocytes: 0.35 10*3/uL — ABNORMAL HIGH (ref 0.00–0.07)
Basophils Absolute: 0 10*3/uL (ref 0.0–0.1)
Basophils Relative: 0 %
Eosinophils Absolute: 0.2 10*3/uL (ref 0.0–0.5)
Eosinophils Relative: 1 %
HCT: 27.5 % — ABNORMAL LOW (ref 39.0–52.0)
Hemoglobin: 9.4 g/dL — ABNORMAL LOW (ref 13.0–17.0)
Immature Granulocytes: 3 %
Lymphocytes Relative: 23 %
Lymphs Abs: 3.2 10*3/uL (ref 0.7–4.0)
MCH: 28.3 pg (ref 26.0–34.0)
MCHC: 34.2 g/dL (ref 30.0–36.0)
MCV: 82.8 fL (ref 80.0–100.0)
Monocytes Absolute: 1.2 10*3/uL — ABNORMAL HIGH (ref 0.1–1.0)
Monocytes Relative: 9 %
Neutro Abs: 9.1 10*3/uL — ABNORMAL HIGH (ref 1.7–7.7)
Neutrophils Relative %: 64 %
Platelet Count: 358 10*3/uL (ref 150–400)
RBC: 3.32 MIL/uL — ABNORMAL LOW (ref 4.22–5.81)
RDW: 15.9 % — ABNORMAL HIGH (ref 11.5–15.5)
WBC Count: 14.1 10*3/uL — ABNORMAL HIGH (ref 4.0–10.5)
nRBC: 0.1 % (ref 0.0–0.2)

## 2021-11-14 LAB — CMP (CANCER CENTER ONLY)
ALT: 19 U/L (ref 0–44)
AST: 23 U/L (ref 15–41)
Albumin: 3.2 g/dL — ABNORMAL LOW (ref 3.5–5.0)
Alkaline Phosphatase: 88 U/L (ref 38–126)
Anion gap: 5 (ref 5–15)
BUN: 21 mg/dL (ref 8–23)
CO2: 32 mmol/L (ref 22–32)
Calcium: 9.1 mg/dL (ref 8.9–10.3)
Chloride: 103 mmol/L (ref 98–111)
Creatinine: 1.13 mg/dL (ref 0.61–1.24)
GFR, Estimated: >60 mL/min (ref >60)
Glucose, Bld: 100 mg/dL — ABNORMAL HIGH (ref 70–99)
Potassium: 4.3 mmol/L (ref 3.5–5.1)
Sodium: 140 mmol/L (ref 135–145)
Total Bilirubin: 2.4 mg/dL — ABNORMAL HIGH (ref 0.3–1.2)
Total Protein: 6.2 g/dL — ABNORMAL LOW (ref 6.5–8.1)

## 2021-11-14 NOTE — Progress Notes (Signed)
[facility] [phone]) [phone]   [phone]  OFFICE PROGRESS NOTE  Lind Covert, MD [place] [address]  DIAGNOSIS:  1) Stage IV (T1b, N3, M1 C) non-small cell lung cancer, adenocarcinoma with positive ALK gene translocation diagnosed in February 2023. 2) deep venous thrombosis of the left lower extremity  PRIOR THERAPY: None  CURRENT THERAPY:  1) Alecensa (Alectinib) 600 mg p.o. twice daily.  First dose April 11, 2021.  Status post 7 months of treatment. 2) Eliquis 2.5 mg p.o. twice daily.  First dose 10/11/2021.  It is currently on hold.  INTERVAL HISTORY: Arthur [name] 81 y.o. male returns to the clinic today for follow-up visit accompanied by his son.  The patient is feeling much better today.  He was admitted to the hospital a week ago with pneumonia and sepsis.  He was treated with Duricef.  He was also found to have evidence for hemolytic anemia during his hospitalization and he started a tapered dose of prednisone.  He is tolerating it fairly well.  He is feeling much better today with no significant chest pain, shortness of breath, cough or hemoptysis.  He has no nausea, vomiting, diarrhea or constipation.  He has no headache or visual changes.  He continues to have swelling of the lower extremities.  He has been off treatment with Eliquis since his hospitalization.  MEDICAL HISTORY: Past Medical History:  Diagnosis Date   Diabetes mellitus without complication ([place])    High cholesterol    Hypertension    lung ca    Normal nuclear stress test 01/29/2008   stress perfusion study apparently in 2010 in [facility] which he said was negative.    ALLERGIES:  is allergic to ace inhibitors and iohexol.  MEDICATIONS:  Current Outpatient Medications  Medication Sig Dispense Refill   acetaminophen (TYLENOL) 325 MG suppository Place 325 mg rectally every 4 (four) hours as needed for mild pain or moderate  pain.     alectinib (ALECENSA) 150 MG capsule Take 4 capsules (600 mg total) by mouth 2 (two) times daily with a meal. 240 capsule 3   clopidogrel (PLAVIX) 75 MG tablet Take 1 tablet (75 mg total) by mouth daily. 90 tablet 1   furosemide (LASIX) 20 MG tablet 1 tablet p.o. daily on as-needed basis for swelling of the lower extremities. 20 tablet 0   Iron-FA-B Cmp-C-Biot-Probiotic (FUSION PLUS) CAPS Take 1 capsule by mouth daily.     omeprazole (PRILOSEC) 20 MG capsule Take 1 capsule (20 mg total) by mouth daily as needed. 90 capsule 0   [START ON 11/29/2021] predniSONE (DELTASONE) 10 MG tablet Take 1 tablet (10 mg total) by mouth daily with breakfast for 7 days. 7 tablet 0   predniSONE (DELTASONE) 20 MG tablet Take 3 tablets (60 mg total) by mouth daily with breakfast for 4 days. 12 tablet 0   [START ON 11/15/2021] predniSONE (DELTASONE) 20 MG tablet Take 2 tablets (40 mg total) by mouth daily with breakfast for 7 days. 14 tablet 0   [START ON 11/22/2021] predniSONE (DELTASONE) 20 MG tablet Take 1 tablet (20 mg total) by mouth daily with breakfast for 7 days. 7 tablet 0   rosuvastatin (CRESTOR) 5 MG tablet Take 1 tablet by mouth once daily 90 tablet 0   No current facility-administered medications for this visit.    SURGICAL HISTORY:  Past Surgical History:  Procedure Laterality Date   CATARACT EXTRACTION, BILATERAL  2006  CORONARY STENT INTERVENTION N/A 02/06/2021   Procedure: CORONARY STENT INTERVENTION;  Surgeon: Jettie Booze, MD;  Location: [facility];  Service: Cardiovascular;  Laterality: N/A;   FINE NEEDLE ASPIRATION  03/21/2021   Procedure: FINE NEEDLE ASPIRATION;  Surgeon: Garner Nash, DO;  Location: [facility];  Service: Pulmonary;;   INTRAVASCULAR ULTRASOUND/IVUS N/A 02/06/2021   Procedure: Intravascular Ultrasound/IVUS;  Surgeon: Jettie Booze, MD;  Location: [facility];  Service: Cardiovascular;  Laterality: N/A;   KNEE SURGERY  2005   LEFT  HEART CATH AND CORONARY ANGIOGRAPHY N/A 02/06/2021   Procedure: LEFT HEART CATH AND CORONARY ANGIOGRAPHY;  Surgeon: Jettie Booze, MD;  Location: [facility];  Service: Cardiovascular;  Laterality: N/A;   VIDEO BRONCHOSCOPY WITH ENDOBRONCHIAL ULTRASOUND Bilateral 03/21/2021   Procedure: VIDEO BRONCHOSCOPY WITH ENDOBRONCHIAL ULTRASOUND;  Surgeon: Garner Nash, DO;  Location: [facility];  Service: Pulmonary;  Laterality: Bilateral;    REVIEW OF SYSTEMS:  Constitutional: positive for fatigue Eyes: negative Ears, nose, mouth, throat, and face: negative Respiratory: negative Cardiovascular: negative Gastrointestinal: negative Genitourinary:negative Integument/breast: negative Hematologic/lymphatic: negative Musculoskeletal:positive for muscle weakness Neurological: negative Behavioral/Psych: negative Endocrine: negative Allergic/Immunologic: negative   PHYSICAL EXAMINATION: General appearance: alert, cooperative, fatigued, and no distress Head: Normocephalic, without obvious abnormality, atraumatic Neck: no adenopathy, no JVD, supple, symmetrical, trachea midline, and thyroid not enlarged, symmetric, no tenderness/mass/nodules Lymph nodes: Cervical, supraclavicular, and axillary nodes normal. Resp: clear to auscultation bilaterally Back: symmetric, no curvature. ROM normal. No CVA tenderness. Cardio: regular rate and rhythm, S1, S2 normal, no murmur, click, rub or gallop GI: soft, non-tender; bowel sounds normal; no masses,  no organomegaly Extremities: edema 1+ edema in the left lower extremity Neurologic: Alert and oriented X 3, normal strength and tone. Normal symmetric reflexes. Normal coordination and gait  ECOG PERFORMANCE STATUS: 1 - Symptomatic but completely ambulatory  Blood pressure (!) 157/59, pulse 77, temperature 97.6 F (36.4 C), temperature source Oral, resp. rate 17, height $RemoveBe'5\' 11"'PqReVYiaf$  (1.803 m), weight 173 lb 5 oz (78.6 kg), SpO2 98 %.  LABORATORY  DATA: Lab Results  Component Value Date   WBC 14.1 (H) 11/14/2021   HGB 9.4 (L) 11/14/2021   HCT 27.5 (L) 11/14/2021   MCV 82.8 11/14/2021   PLT 358 11/14/2021      Chemistry      Component Value Date/Time   NA 139 11/10/2021 0354   NA 140 07/17/2021 1103   K 3.2 (L) 11/10/2021 0354   CL 106 11/10/2021 0354   CO2 27 11/10/2021 0354   BUN 22 11/10/2021 0354   BUN 21 10/09/2021 1147   CREATININE 1.22 11/10/2021 0354   CREATININE 1.32 (H) 10/24/2021 1001   CREATININE 1.18 11/15/2015 1114      Component Value Date/Time   CALCIUM 8.7 (L) 11/10/2021 0354   ALKPHOS 68 11/08/2021 0349   AST 29 11/08/2021 0349   AST 24 10/24/2021 1001   ALT 17 11/08/2021 0349   ALT 16 10/24/2021 1001   BILITOT 1.2 11/08/2021 0349   BILITOT 1.5 (H) 10/24/2021 1001       RADIOGRAPHIC STUDIES: NM Pulmonary Perfusion  Result Date: 11/07/2021 CLINICAL DATA:  High probability for pulmonary embolism. Hypoxia. History of cancer, PE, and DVT. EXAM: NUCLEAR MEDICINE PERFUSION LUNG SCAN TECHNIQUE: Perfusion images were obtained in multiple projections after intravenous injection of radiopharmaceutical. Ventilation scans intentionally deferred if perfusion scan and chest x-ray adequate for interpretation during COVID 19 epidemic. RADIOPHARMACEUTICALS:  3.8 mCi Tc-50m MAA IV COMPARISON:  Same day chest radiograph at 0702 hours; CT chest 11/06/2021 FINDINGS: Comparison chest radiograph demonstrates enlargement of the cardiac silhouette and bilateral small layering pleural effusions with associated subsegmental bibasilar atelectasis. Perfusion imaging demonstrates subsegmental perfusion abnormalities in the anterior segment of the right upper lobe and superior segment of the right lower lobe. Non segmental perfusion defects at the costophrenic angles and within the fissures, corresponding to pleural fluid on chest radiograph and CT chest. Additional non segmental perfusion defect at the apical segment of the right  upper lobe secondary to pleural thickening at the right apex, seen on CT chest 11/06/2021. IMPRESSION: Two subsegmental perfusion abnormalities in the right lung with additional non-segmental perfusion defects related to pleural fluid and pleural thickening seen on recent chest imaging. Per modified PIOPED II criteria, findings are very low probability for pulmonary embolism. Electronically Signed   By: Ileana Roup M.D.   On: 11/07/2021 13:57   DG Chest 2 View  Result Date: 11/07/2021 CLINICAL DATA:  Fever and chills.  Weakness.  Sepsis. EXAM: CHEST - 2 VIEW COMPARISON:  11/06/2021 FINDINGS: 0702 hours. The cardio pericardial silhouette is enlarged. Interval increase in ground-glass attenuation in the left mid lung, nonspecific but likely related to layering pleural fluid. There is persistent bibasilar atelectasis, left greater than right with small bilateral pleural effusions. Telemetry leads overlie the chest. IMPRESSION: No substantial change in the left greater than right basilar atelectasis with bilateral small pleural effusions. Electronically Signed   By: Misty Stanley M.D.   On: 11/07/2021 07:15   CT CHEST ABDOMEN PELVIS WO CONTRAST  Result Date: 11/06/2021 CLINICAL DATA:  Sepsis. Fever and weakness. Radiologic records indicates non-small cell lung cancer. EXAM: CT CHEST, ABDOMEN AND PELVIS WITHOUT CONTRAST TECHNIQUE: Multidetector CT imaging of the chest, abdomen and pelvis was performed following the standard protocol without IV contrast. RADIATION DOSE REDUCTION: This exam was performed according to the departmental dose-optimization program which includes automated exposure control, adjustment of the mA and/or kV according to patient size and/or use of iterative reconstruction technique. COMPARISON:  Chest radiograph earlier today. PET CT 10/14/2021. Chest abdomen pelvis CT 08/02/2021 FINDINGS: CT CHEST FINDINGS Cardiovascular: Minimal aortic atherosclerosis and tortuosity. No aneurysm. Mild  cardiomegaly. Trace pericardial effusion. There are coronary artery calcifications Mediastinum/Nodes: Breathing motion artifact limits assessment. Few scattered mediastinal lymph nodes with increased density are stable or slightly diminished in size from prior exam. No axillary adenopathy. Hilar assessment is limited on this unenhanced exam. No esophageal wall thickening. Lungs/Pleura: Small bilateral pleural effusions have minimally increased in size from prior chest CT. Minimal fluid in the right minor fissure. Right lower lobe nodule measures 9 x 6 mm, series 4, image 87, previously 7 x 7 mm. Size differences are likely due to differences in caliper placement. Low-density extrapleural structure posteriorly abutting right ribs spans 2.4 x 1 cm series 3, image 34, previously 3.4 x 2 cm. The measures fluid density and may represent loculated pleural fluid. This was not hypermetabolic on intervening PET. Right apical pleural thickening measuring up to 9 mm, unchanged allowing for differences in caliper placement. Chronic interstitial thickening in the mid lower chest is unchanged. Mild emphysema with chronic bronchial thickening. No acute airspace disease or pneumonia. Musculoskeletal: Widespread bony metastatic disease with numerous sclerotic foci throughout the ribs and thoracic spine. There is no evidence of acute pathologic fracture. This was recently assessed with thoracic spine MRI CT ABDOMEN PELVIS FINDINGS Hepatobiliary: No evidence of focal lesion on this unenhanced exam. Small stones or sludge in  the dependent gallbladder, no pericholecystic inflammation. Pancreas: Motion artifact limits assessment, no evidence of pancreatic inflammation. Unchanged coarse calcification measuring 17 mm in the pancreatic head. Chronic pancreatic ductal dilatation which is obscured due to motion. Spleen: Normal in size without focal abnormality. Adrenals/Urinary Tract: No adrenal nodule. Nonobstructing stone in the right  kidney. No hydronephrosis. Motion artifact limits detailed renal assessment. No obvious renal inflammation. There is a small right-sided bladder diverticulum. Urinary bladder is otherwise unremarkable. Stomach/Bowel: Right inguinal hernia containing small bowel loops is not entirely included in the field of view. There is no evidence of associated obstruction or inflammation. The stomach is decompressed and not well assessed. Detailed bowel assessment is limited due to motion and lack contrast. Moderate volume of colonic stool. Vascular/Lymphatic: Aortic atherosclerosis without aneurysm. IVC filter in place. There is no bulky abdominopelvic adenopathy. Reproductive: Unremarkable prostate. Other: Mild generalized body wall edema. No ascites. Moderate-sized right inguinal hernia containing small bowel, not entirely included in the field of view. No free intra-abdominal air. Musculoskeletal: Widespread bony metastatic disease, stable from prior imaging. L4 inferior endplate compression fracture is unchanged from recent lumbar MRI. No evidence of acute pathologic fracture. IMPRESSION: 1. Small bilateral pleural effusions have minimally increased in size from prior chest CT. 2. Mild body wall edema. Otherwise no acute findings in the chest, abdomen, or pelvis. 3. Right pleural thickening is again seen. Extrapleural low-density in the right hemithorax is diminished from prior chest CT, and was not hypermetabolic on prior PET. Right lower lobe pulmonary nodule is unchanged allowing for differences in caliper placement. Attention to this at follow-up recommended. 4. Similar interstitial thickening in the mid lower lung zones, may represent chronic edema. 5. Widespread bony metastatic disease, grossly stable from prior imaging. Mild L4 inferior endplate compression fracture was present on 10/28/2021 lumbar MRI. 6. Right inguinal hernia containing small bowel loops, not entirely included in the field of view. No evidence of  obstruction or inflammation. 7. Nonobstructing right nephrolithiasis. 8. Incidental note of gallstones or sludge in the dependent gallbladder without evidence of cholecystitis. Aortic Atherosclerosis (ICD10-I70.0) and Emphysema (ICD10-J43.9). Electronically Signed   By: Keith Rake M.D.   On: 11/06/2021 18:17   DG Chest 2 View  Result Date: 11/06/2021 CLINICAL DATA:  Suspected sepsis.  Fever and chills EXAM: CHEST - 2 VIEW COMPARISON:  Chest 06/19/2021 FINDINGS: Heart size and vascularity normal.  Negative for heart failure. Negative for pneumonia. Multiple left rib fractures. Blunting left costophrenic angle unchanged likely due to pleural scarring or small effusion. IMPRESSION: 1. No acute cardiopulmonary abnormality. 2. Multiple left rib fractures. Chronic blunting left costophrenic angle. Electronically Signed   By: Franchot Gallo M.D.   On: 11/06/2021 15:48   MR Lumbar Spine W Wo Contrast  Result Date: 10/31/2021 CLINICAL DATA:  81 year old male with a history of metastatic non-small cell lung cancer. Treated bone metastases. Left foot drop earlier this year. T11 metastasis. Restaging. EXAM: MRI LUMBAR SPINE WITHOUT AND WITH CONTRAST TECHNIQUE: Multiplanar and multiecho pulse sequences of the lumbar spine were obtained without and with intravenous contrast. CONTRAST:  7.5 mL Vueway COMPARISON:  Thoracic spine MRI from the same day reported separately. PET-CT 08/15/2021. Prior lumbar MRI 06/15/2021. FINDINGS: Segmentation: Normal on the prior CTs. This is concordant with the thoracic spine numbering today. Alignment: Stable lumbar lordosis since July. Mild chronic grade 1 anterolisthesis of L5 on S1. Vertebrae: New L4 inferior endplate compression fracture since July. L4 vertebral body loss of height 22%. Patchy marrow edema and enhancement subjacent  to the fractured endplate. Associated progression of L4-L5 posterior disc osteophyte complex, with mild associated posteroinferior endplate  retropulsion. See additional L4-L5 details below. But largely normal background L4 vertebral body marrow signal. As on the thoracic spine the same day (reported separately), multifocal sclerotic lumbar vertebral metastases are much more apparent on CT than MRI. No other marrow edema. No destructive osseous lesion identified. Intact visible sacrum and SI joints. Conus medullaris and cauda equina: Conus extends to the L1 level. No lower spinal cord or conus signal abnormality. No abnormal intradural enhancement identified (mild fatty filum terminalis, normal variant). No dural thickening or enhancement. Except for spinal stenosis at L4-L5 (see below), cauda equina nerve roots appear within normal limits. Paraspinal and other soft tissues: Stable since July. Disc levels: Disc spaces from the visible lower thoracic spine through L3-L4 are stable since the May MRI. L4-L5: Progressed chronic circumferential disc osteophyte complex with a bulky posterior component in the setting of L4 inferior endplate compression fracture. Bulky facet and ligament flavum hypertrophy. Severe spinal stenosis and bilateral lateral recess stenosis (L5 nerve levels). Moderate bilateral L4 foraminal stenosis. This level is mildly progressed. L5-S1: Chronic grade 1 spondylolisthesis with disc bulging/pseudo disc and severe facet hypertrophy. New degenerative facet joint fluid bilaterally. But no spinal stenosis. Mild bilateral lateral recess stenosis (S1 nerve levels), mild left and moderate right L5 neural foraminal stenosis are stable. IMPRESSION: 1. Acute to subacute L4 compression fracture is new since July. Inferior endplate deformity with 22% loss of height, mild to moderate marrow edema, and some posterior retropulsion of both disc and bone exacerbating already severe multifactorial L4-L5 spinal and lateral recess stenosis. However, favor this is a benign osteoporotic fracture - see #2. 2. Elsewhere treated/quiescent MRI appearance of  lumbar vertebral metastases, similar to the Thoracic MRI reported separately today. 3. Progressed chronic facet arthropathy at L5-S1 since May, probably reactive to #1. Other lumbar spine degeneration remains stable. Electronically Signed   By: Genevie Ann M.D.   On: 10/31/2021 13:53   MR THORACIC SPINE W WO CONTRAST  Result Date: 10/31/2021 CLINICAL DATA:  81 year old male with a history of metastatic non-small cell lung cancer. Treated bone metastases. Left foot drop earlier this year. T11 metastasis. Restaging. EXAM: MRI THORACIC WITHOUT AND WITH CONTRAST TECHNIQUE: Multiplanar and multiecho pulse sequences of the thoracic spine were obtained without and with intravenous contrast. CONTRAST:  7.5 mL Vueway COMPARISON:  PET-CT 08/15/2021 and earlier. FINDINGS: Limited cervical spine imaging: Grossly negative for age. Visible cervical bone marrow signal appears to be normal. Thoracic spine segmentation: Normal on prior CTs, hypoplastic 12th ribs. Alignment: Mildly exaggerated upper thoracic kyphosis is stable since July. No significant thoracic spondylolisthesis or scoliosis. Vertebrae: Widespread flowing thoracic endplate osteophytes resulting in thoracic interbody ankylosis better demonstrated by CT. And scattered sclerotic lesions in the thoracic spine also are largely not apparent by MRI. Largely normal marrow signal at the T2 body (subtle increased STIR signal) which demonstrates conspicuous sclerosis by CT. And there is only mild heterogeneity of the T11 posterior vertebral body where sclerotic metastasis is evident by CT (see series 28, image 10 and series 5, image 11. Nearby T11 endplate irregularity appears to be degenerative, Schmorl's node. No confluent thoracic vertebral marrow edema or enhancement. Cord: Within normal limits. No convincing thoracic spinal cord edema or myelomalacia. No convincing abnormal thoracic intradural enhancement or dural thickening. Conus medullaris is at T12-L1. Paraspinal and  other soft tissues: Areas of T2 hyperintense pleural thickening and/or loculated pleural fluid appears stable  to decreased since July, however, some of these areas do demonstrate associated pleural thickening and nodular enhancement (series 29, image 11). Other thoracic paraspinal soft tissues are within normal limits. Visible chest and upper abdominal viscera appear stable. Disc levels: Mild for age thoracic spine degeneration. No significant thoracic spinal stenosis. No significant thoracic neural foraminal stenosis. IMPRESSION: 1. MRI appearance compatible with treated/quiescent thoracic vertebral metastases, which were much more apparent on the prior CTs (sclerotic). 2. No thoracic spinal cord metastatic disease. And mild for age thoracic spine degeneration with no spinal or foraminal stenosis. 3. Visible thorax and upper abdomen appear stable since July PET-CT. Electronically Signed   By: Genevie Ann M.D.   On: 10/31/2021 13:52   MR FEMUR LEFT W WO CONTRAST  Result Date: 10/31/2021 CLINICAL DATA:  Non-small cell lung cancer metastatic workup. Prior DVT and hematoma EXAM: MR OF THE LEFT LOWER EXTREMITY WITHOUT AND WITH CONTRAST TECHNIQUE: Multiplanar, multisequence MR imaging of the left thigh/femur was performed both before and after administration of intravenous contrast. CONTRAST:  7.5 cc Vueway COMPARISON:  CT scan 06/20/2021 FINDINGS: Bones/Joint/Cartilage Signal heterogeneity and boundary artifact in the vicinity of the hip and knee. The hip and knee are distorted and poorly characterized. No discrete osseous metastatic lesion observed. Ligaments N/A Muscles and Tendons Complex collection in the left biceps femoris muscle with low T1 and T2 signal margin probably from hemosiderin deposition measures 3.8 by 2.2 by 13.8 cm (volume = 60 cm^3). On the CT of 05/31/2021 this measured 6.6 by 5.1 by 19.1 cm (volume = 340 cm^3). There is only a thin margin of enhancement. Appearance compatible with substantially  improved intramuscular hematoma. The degree of associated displacement of the sciatic nerve is substantially improved. Soft tissues Subcutaneous edema in the thigh, primarily anterolaterally. In the distal thigh the subcutaneous edema is more circumferential. No drainable collection in the subcutaneous tissues. IMPRESSION: 1. Reduction in size of the intramuscular hematoma in the left biceps femoris muscle in the thigh, currently 60 cc and previously 340 cc on 06/20/2021. The degree of associated displacement of the sciatic nerve is substantially improved. 2. Subcutaneous edema in the thigh. 3. No discrete metastatic lesion observed. 4. The hip and knee are distorted and poorly characterized. Electronically Signed   By: Van Clines M.D.   On: 10/31/2021 11:01    ASSESSMENT AND PLAN: This is a very pleasant 81 years old [place] male diagnosed with Stage IV (T1b, N3, M1 C) non-small cell lung cancer, adenocarcinoma with positive ALK gene translocation in February 2023. The patient is currently undergoing treatment with Alecensa (Alectinib) 600 mg p.o. twice daily.  He has been tolerating the treatment well with no concerning complaints.  He is status post 7 months of treatment.  This treatment was interrupted for 2 weeks secondary to the venous thrombosis and hospitalization.   The patient has been tolerating this treatment well with no concerning adverse effects. I recommended for him to continue his current treatment with Alecensa (Alectinib) with the same dose. For the history of hemolytic anemia/iron deficiency, he will continue with the oral iron tablet with fusion plus.  He is also on a tapered dose of prednisone for the hemolysis which likely to be secondary to IVC filter and mechanical distraction. For the history of deep venous thrombosis and pulmonary embolism, he is currently have IVC filter placed and on treatment with Plavix.  He was on a reduced dose of Eliquis but this was held during his  hospitalization.  I will  continue to hold it for 2 more weeks before resuming it.  Once we resume the Eliquis for at least 2 more weeks, I may consider removing the IVC filter. The patient will come back for follow-up visit in 2 weeks for evaluation and repeat blood work. He was advised to call immediately if he has any other concerning symptoms in the interval. The patient voices understanding of current disease status and treatment options and is in agreement with the current care plan.  All questions were answered. The patient knows to call the clinic with any problems, questions or concerns. We can certainly see the patient much sooner if necessary.  The total time spent in the appointment was 30 minutes.  Disclaimer: This note was dictated with voice recognition software. Similar sounding words can inadvertently be transcribed and may not be corrected upon review.

## 2021-11-15 ENCOUNTER — Other Ambulatory Visit: Payer: Self-pay

## 2021-11-16 DIAGNOSIS — C3491 Malignant neoplasm of unspecified part of right bronchus or lung: Secondary | ICD-10-CM | POA: Diagnosis not present

## 2021-11-16 DIAGNOSIS — Z7902 Long term (current) use of antithrombotics/antiplatelets: Secondary | ICD-10-CM | POA: Diagnosis not present

## 2021-11-16 DIAGNOSIS — Z86718 Personal history of other venous thrombosis and embolism: Secondary | ICD-10-CM | POA: Diagnosis not present

## 2021-11-16 DIAGNOSIS — Z9181 History of falling: Secondary | ICD-10-CM | POA: Diagnosis not present

## 2021-11-16 DIAGNOSIS — I2510 Atherosclerotic heart disease of native coronary artery without angina pectoris: Secondary | ICD-10-CM | POA: Diagnosis not present

## 2021-11-16 DIAGNOSIS — M109 Gout, unspecified: Secondary | ICD-10-CM | POA: Diagnosis not present

## 2021-11-16 DIAGNOSIS — I69354 Hemiplegia and hemiparesis following cerebral infarction affecting left non-dominant side: Secondary | ICD-10-CM | POA: Diagnosis not present

## 2021-11-16 DIAGNOSIS — D630 Anemia in neoplastic disease: Secondary | ICD-10-CM | POA: Diagnosis not present

## 2021-11-16 DIAGNOSIS — M1990 Unspecified osteoarthritis, unspecified site: Secondary | ICD-10-CM | POA: Diagnosis not present

## 2021-11-16 DIAGNOSIS — M21372 Foot drop, left foot: Secondary | ICD-10-CM | POA: Diagnosis not present

## 2021-11-16 DIAGNOSIS — I10 Essential (primary) hypertension: Secondary | ICD-10-CM | POA: Diagnosis not present

## 2021-11-16 DIAGNOSIS — E1142 Type 2 diabetes mellitus with diabetic polyneuropathy: Secondary | ICD-10-CM | POA: Diagnosis not present

## 2021-11-16 DIAGNOSIS — I252 Old myocardial infarction: Secondary | ICD-10-CM | POA: Diagnosis not present

## 2021-11-16 DIAGNOSIS — I69990 Apraxia following unspecified cerebrovascular disease: Secondary | ICD-10-CM | POA: Diagnosis not present

## 2021-11-20 DIAGNOSIS — I252 Old myocardial infarction: Secondary | ICD-10-CM | POA: Diagnosis not present

## 2021-11-20 DIAGNOSIS — I69990 Apraxia following unspecified cerebrovascular disease: Secondary | ICD-10-CM | POA: Diagnosis not present

## 2021-11-20 DIAGNOSIS — Z86718 Personal history of other venous thrombosis and embolism: Secondary | ICD-10-CM | POA: Diagnosis not present

## 2021-11-20 DIAGNOSIS — E1142 Type 2 diabetes mellitus with diabetic polyneuropathy: Secondary | ICD-10-CM | POA: Diagnosis not present

## 2021-11-20 DIAGNOSIS — I10 Essential (primary) hypertension: Secondary | ICD-10-CM | POA: Diagnosis not present

## 2021-11-20 DIAGNOSIS — I69354 Hemiplegia and hemiparesis following cerebral infarction affecting left non-dominant side: Secondary | ICD-10-CM | POA: Diagnosis not present

## 2021-11-20 DIAGNOSIS — M21372 Foot drop, left foot: Secondary | ICD-10-CM | POA: Diagnosis not present

## 2021-11-20 DIAGNOSIS — I2510 Atherosclerotic heart disease of native coronary artery without angina pectoris: Secondary | ICD-10-CM | POA: Diagnosis not present

## 2021-11-20 DIAGNOSIS — D630 Anemia in neoplastic disease: Secondary | ICD-10-CM | POA: Diagnosis not present

## 2021-11-20 DIAGNOSIS — Z9181 History of falling: Secondary | ICD-10-CM | POA: Diagnosis not present

## 2021-11-20 DIAGNOSIS — Z7902 Long term (current) use of antithrombotics/antiplatelets: Secondary | ICD-10-CM | POA: Diagnosis not present

## 2021-11-20 DIAGNOSIS — C3491 Malignant neoplasm of unspecified part of right bronchus or lung: Secondary | ICD-10-CM | POA: Diagnosis not present

## 2021-11-20 DIAGNOSIS — M109 Gout, unspecified: Secondary | ICD-10-CM | POA: Diagnosis not present

## 2021-11-20 DIAGNOSIS — M1990 Unspecified osteoarthritis, unspecified site: Secondary | ICD-10-CM | POA: Diagnosis not present

## 2021-11-22 ENCOUNTER — Other Ambulatory Visit: Payer: Self-pay

## 2021-11-23 DIAGNOSIS — I82403 Acute embolism and thrombosis of unspecified deep veins of lower extremity, bilateral: Secondary | ICD-10-CM | POA: Diagnosis not present

## 2021-11-23 DIAGNOSIS — N1831 Chronic kidney disease, stage 3a: Secondary | ICD-10-CM | POA: Diagnosis not present

## 2021-11-23 DIAGNOSIS — D631 Anemia in chronic kidney disease: Secondary | ICD-10-CM | POA: Diagnosis not present

## 2021-11-25 DIAGNOSIS — C3490 Malignant neoplasm of unspecified part of unspecified bronchus or lung: Secondary | ICD-10-CM | POA: Diagnosis not present

## 2021-11-25 DIAGNOSIS — I6340 Cerebral infarction due to embolism of unspecified cerebral artery: Secondary | ICD-10-CM | POA: Diagnosis not present

## 2021-11-26 DIAGNOSIS — I69990 Apraxia following unspecified cerebrovascular disease: Secondary | ICD-10-CM | POA: Diagnosis not present

## 2021-11-26 DIAGNOSIS — Z86718 Personal history of other venous thrombosis and embolism: Secondary | ICD-10-CM | POA: Diagnosis not present

## 2021-11-26 DIAGNOSIS — Z7902 Long term (current) use of antithrombotics/antiplatelets: Secondary | ICD-10-CM | POA: Diagnosis not present

## 2021-11-26 DIAGNOSIS — C3491 Malignant neoplasm of unspecified part of right bronchus or lung: Secondary | ICD-10-CM | POA: Diagnosis not present

## 2021-11-26 DIAGNOSIS — M1990 Unspecified osteoarthritis, unspecified site: Secondary | ICD-10-CM | POA: Diagnosis not present

## 2021-11-26 DIAGNOSIS — Z9181 History of falling: Secondary | ICD-10-CM | POA: Diagnosis not present

## 2021-11-26 DIAGNOSIS — I252 Old myocardial infarction: Secondary | ICD-10-CM | POA: Diagnosis not present

## 2021-11-26 DIAGNOSIS — D630 Anemia in neoplastic disease: Secondary | ICD-10-CM | POA: Diagnosis not present

## 2021-11-26 DIAGNOSIS — M109 Gout, unspecified: Secondary | ICD-10-CM | POA: Diagnosis not present

## 2021-11-26 DIAGNOSIS — I69354 Hemiplegia and hemiparesis following cerebral infarction affecting left non-dominant side: Secondary | ICD-10-CM | POA: Diagnosis not present

## 2021-11-26 DIAGNOSIS — E1142 Type 2 diabetes mellitus with diabetic polyneuropathy: Secondary | ICD-10-CM | POA: Diagnosis not present

## 2021-11-26 DIAGNOSIS — I10 Essential (primary) hypertension: Secondary | ICD-10-CM | POA: Diagnosis not present

## 2021-11-26 DIAGNOSIS — M21372 Foot drop, left foot: Secondary | ICD-10-CM | POA: Diagnosis not present

## 2021-11-26 DIAGNOSIS — I2510 Atherosclerotic heart disease of native coronary artery without angina pectoris: Secondary | ICD-10-CM | POA: Diagnosis not present

## 2021-11-27 ENCOUNTER — Inpatient Hospital Stay (HOSPITAL_BASED_OUTPATIENT_CLINIC_OR_DEPARTMENT_OTHER): Payer: Medicare Other | Admitting: Internal Medicine

## 2021-11-27 ENCOUNTER — Inpatient Hospital Stay: Payer: Medicare Other

## 2021-11-27 ENCOUNTER — Encounter: Payer: Self-pay | Admitting: Internal Medicine

## 2021-11-27 VITALS — BP 159/63 | HR 69 | Temp 98.0°F | Resp 17 | Wt 166.4 lb

## 2021-11-27 DIAGNOSIS — C7951 Secondary malignant neoplasm of bone: Secondary | ICD-10-CM | POA: Diagnosis not present

## 2021-11-27 DIAGNOSIS — Z86718 Personal history of other venous thrombosis and embolism: Secondary | ICD-10-CM | POA: Diagnosis not present

## 2021-11-27 DIAGNOSIS — Z79899 Other long term (current) drug therapy: Secondary | ICD-10-CM | POA: Diagnosis not present

## 2021-11-27 DIAGNOSIS — Z955 Presence of coronary angioplasty implant and graft: Secondary | ICD-10-CM | POA: Diagnosis not present

## 2021-11-27 DIAGNOSIS — Z86711 Personal history of pulmonary embolism: Secondary | ICD-10-CM | POA: Diagnosis not present

## 2021-11-27 DIAGNOSIS — D509 Iron deficiency anemia, unspecified: Secondary | ICD-10-CM | POA: Diagnosis not present

## 2021-11-27 DIAGNOSIS — Z7901 Long term (current) use of anticoagulants: Secondary | ICD-10-CM | POA: Diagnosis not present

## 2021-11-27 DIAGNOSIS — C3491 Malignant neoplasm of unspecified part of right bronchus or lung: Secondary | ICD-10-CM

## 2021-11-27 DIAGNOSIS — C3490 Malignant neoplasm of unspecified part of unspecified bronchus or lung: Secondary | ICD-10-CM | POA: Diagnosis not present

## 2021-11-27 DIAGNOSIS — D500 Iron deficiency anemia secondary to blood loss (chronic): Secondary | ICD-10-CM

## 2021-11-27 LAB — CBC WITH DIFFERENTIAL (CANCER CENTER ONLY)
Abs Immature Granulocytes: 0.07 10*3/uL (ref 0.00–0.07)
Basophils Absolute: 0 10*3/uL (ref 0.0–0.1)
Basophils Relative: 0 %
Eosinophils Absolute: 0.4 10*3/uL (ref 0.0–0.5)
Eosinophils Relative: 5 %
HCT: 26.7 % — ABNORMAL LOW (ref 39.0–52.0)
Hemoglobin: 9 g/dL — ABNORMAL LOW (ref 13.0–17.0)
Immature Granulocytes: 1 %
Lymphocytes Relative: 30 %
Lymphs Abs: 2.8 10*3/uL (ref 0.7–4.0)
MCH: 28.8 pg (ref 26.0–34.0)
MCHC: 33.7 g/dL (ref 30.0–36.0)
MCV: 85.3 fL (ref 80.0–100.0)
Monocytes Absolute: 0.7 10*3/uL (ref 0.1–1.0)
Monocytes Relative: 8 %
Neutro Abs: 5.1 10*3/uL (ref 1.7–7.7)
Neutrophils Relative %: 56 %
Platelet Count: 235 10*3/uL (ref 150–400)
RBC: 3.13 MIL/uL — ABNORMAL LOW (ref 4.22–5.81)
RDW: 17.4 % — ABNORMAL HIGH (ref 11.5–15.5)
WBC Count: 9.2 10*3/uL (ref 4.0–10.5)
nRBC: 0 % (ref 0.0–0.2)

## 2021-11-27 LAB — CMP (CANCER CENTER ONLY)
ALT: 32 U/L (ref 0–44)
AST: 37 U/L (ref 15–41)
Albumin: 3.3 g/dL — ABNORMAL LOW (ref 3.5–5.0)
Alkaline Phosphatase: 66 U/L (ref 38–126)
Anion gap: 5 (ref 5–15)
BUN: 24 mg/dL — ABNORMAL HIGH (ref 8–23)
CO2: 36 mmol/L — ABNORMAL HIGH (ref 22–32)
Calcium: 8.6 mg/dL — ABNORMAL LOW (ref 8.9–10.3)
Chloride: 100 mmol/L (ref 98–111)
Creatinine: 1.53 mg/dL — ABNORMAL HIGH (ref 0.61–1.24)
GFR, Estimated: 45 mL/min — ABNORMAL LOW (ref >60)
Glucose, Bld: 90 mg/dL (ref 70–99)
Potassium: 3.5 mmol/L (ref 3.5–5.1)
Sodium: 141 mmol/L (ref 135–145)
Total Bilirubin: 2.9 mg/dL — ABNORMAL HIGH (ref 0.3–1.2)
Total Protein: 6.2 g/dL — ABNORMAL LOW (ref 6.5–8.1)

## 2021-11-27 LAB — IRON AND IRON BINDING CAPACITY (CC-WL,HP ONLY)
Iron: 136 ug/dL (ref 45–182)
Saturation Ratios: 51 % — ABNORMAL HIGH (ref 17.9–39.5)
TIBC: 269 ug/dL (ref 250–450)
UIBC: 133 ug/dL (ref 117–376)

## 2021-11-27 LAB — FERRITIN: Ferritin: 1479 ng/mL — ABNORMAL HIGH (ref 24–336)

## 2021-11-27 NOTE — Progress Notes (Signed)
[facility] Telephone:(336) [phone]   [phone]  OFFICE PROGRESS NOTE  Lind Covert, MD [place] [address]  DIAGNOSIS:  1) Stage IV (T1b, N3, M1 C) non-small cell lung cancer, adenocarcinoma with positive ALK gene translocation diagnosed in February 2023. 2) deep venous thrombosis of the left lower extremity  PRIOR THERAPY: None  CURRENT THERAPY:  1) Alecensa (Alectinib) 600 mg p.o. twice daily.  First dose April 11, 2021.  Status post 7 months of treatment. 2) Eliquis 2.5 mg p.o. twice daily.  First dose 10/11/2021.  He will resume it today..  INTERVAL HISTORY: Arthur [name] 81 y.o. male returns to the clinic today for follow-up visit accompanied by his son.  The patient is feeling fine today with no concerning complaints.  The swelling in the lower extremities have significantly improved with no tenderness.  He denied having any current chest pain, shortness of breath, cough or hemoptysis.  He has no nausea, vomiting, diarrhea or constipation.  He has no headache or visual changes.  He denied having any recent weight loss or night sweats.  He continues to have the tapered dose of prednisone.  He is tolerating his treatment with Alecensa (Alectinib) fairly well.  He is here today for evaluation and repeat blood work.  MEDICAL HISTORY: Past Medical History:  Diagnosis Date   Diabetes mellitus without complication ([place])    High cholesterol    Hypertension    lung ca    Normal nuclear stress test 01/29/2008   stress perfusion study apparently in 2010 in [facility] which he said was negative.    ALLERGIES:  is allergic to ace inhibitors and iohexol.  MEDICATIONS:  Current Outpatient Medications  Medication Sig Dispense Refill   acetaminophen (TYLENOL) 325 MG suppository Place 325 mg rectally every 4 (four) hours as needed for mild pain or moderate pain.     alectinib (ALECENSA) 150 MG capsule Take 4  capsules (600 mg total) by mouth 2 (two) times daily with a meal. 240 capsule 3   clopidogrel (PLAVIX) 75 MG tablet Take 1 tablet (75 mg total) by mouth daily. 90 tablet 1   furosemide (LASIX) 20 MG tablet 1 tablet p.o. daily on as-needed basis for swelling of the lower extremities. 20 tablet 0   Iron-FA-B Cmp-C-Biot-Probiotic (FUSION PLUS) CAPS Take 1 capsule by mouth daily.     omeprazole (PRILOSEC) 20 MG capsule Take 1 capsule (20 mg total) by mouth daily as needed. 90 capsule 0   [START ON 11/29/2021] predniSONE (DELTASONE) 10 MG tablet Take 1 tablet (10 mg total) by mouth daily with breakfast for 7 days. 7 tablet 0   predniSONE (DELTASONE) 20 MG tablet Take 1 tablet (20 mg total) by mouth daily with breakfast for 7 days. 7 tablet 0   rosuvastatin (CRESTOR) 5 MG tablet Take 1 tablet by mouth once daily 90 tablet 0   No current facility-administered medications for this visit.    SURGICAL HISTORY:  Past Surgical History:  Procedure Laterality Date   CATARACT EXTRACTION, BILATERAL  2006   CORONARY STENT INTERVENTION N/A 02/06/2021   Procedure: CORONARY STENT INTERVENTION;  Surgeon: Jettie Booze, MD;  Location: [facility];  Service: Cardiovascular;  Laterality: N/A;   FINE NEEDLE ASPIRATION  03/21/2021   Procedure: FINE NEEDLE ASPIRATION;  Surgeon: Garner Nash, DO;  Location: [facility];  Service: Pulmonary;;   INTRAVASCULAR ULTRASOUND/IVUS N/A 02/06/2021   Procedure: Intravascular Ultrasound/IVUS;  Surgeon: Jettie Booze, MD;  Location: [facility];  Service: Cardiovascular;  Laterality: N/A;   KNEE SURGERY  2005   LEFT HEART CATH AND CORONARY ANGIOGRAPHY N/A 02/06/2021   Procedure: LEFT HEART CATH AND CORONARY ANGIOGRAPHY;  Surgeon: Jettie Booze, MD;  Location: [facility];  Service: Cardiovascular;  Laterality: N/A;   VIDEO BRONCHOSCOPY WITH ENDOBRONCHIAL ULTRASOUND Bilateral 03/21/2021   Procedure: VIDEO BRONCHOSCOPY WITH ENDOBRONCHIAL  ULTRASOUND;  Surgeon: Garner Nash, DO;  Location: [facility];  Service: Pulmonary;  Laterality: Bilateral;    REVIEW OF SYSTEMS:  Constitutional: negative Eyes: negative Ears, nose, mouth, throat, and face: negative Respiratory: negative Cardiovascular: negative Gastrointestinal: negative Genitourinary:negative Integument/breast: negative Hematologic/lymphatic: negative Musculoskeletal:positive for muscle weakness Neurological: negative Behavioral/Psych: negative Endocrine: negative Allergic/Immunologic: negative   PHYSICAL EXAMINATION: General appearance: alert, cooperative, fatigued, and no distress Head: Normocephalic, without obvious abnormality, atraumatic Neck: no adenopathy, no JVD, supple, symmetrical, trachea midline, and thyroid not enlarged, symmetric, no tenderness/mass/nodules Lymph nodes: Cervical, supraclavicular, and axillary nodes normal. Resp: clear to auscultation bilaterally Back: symmetric, no curvature. ROM normal. No CVA tenderness. Cardio: regular rate and rhythm, S1, S2 normal, no murmur, click, rub or gallop GI: soft, non-tender; bowel sounds normal; no masses,  no organomegaly Extremities: edema 1+ edema in the left lower extremity Neurologic: Alert and oriented X 3, normal strength and tone. Normal symmetric reflexes. Normal coordination and gait  ECOG PERFORMANCE STATUS: 1 - Symptomatic but completely ambulatory  Blood pressure (!) 159/63, pulse 69, temperature 98 F (36.7 C), temperature source Oral, resp. rate 17, weight 166 lb 7 oz (75.5 kg), SpO2 94 %.  LABORATORY DATA: Lab Results  Component Value Date   WBC 9.2 11/27/2021   HGB 9.0 (L) 11/27/2021   HCT 26.7 (L) 11/27/2021   MCV 85.3 11/27/2021   PLT 235 11/27/2021      Chemistry      Component Value Date/Time   NA 140 11/14/2021 0819   NA 140 07/17/2021 1103   K 4.3 11/14/2021 0819   CL 103 11/14/2021 0819   CO2 32 11/14/2021 0819   BUN 21 11/14/2021 0819   BUN 21  10/09/2021 1147   CREATININE 1.13 11/14/2021 0819   CREATININE 1.18 11/15/2015 1114      Component Value Date/Time   CALCIUM 9.1 11/14/2021 0819   ALKPHOS 88 11/14/2021 0819   AST 23 11/14/2021 0819   ALT 19 11/14/2021 0819   BILITOT 2.4 (H) 11/14/2021 0819       RADIOGRAPHIC STUDIES: NM Pulmonary Perfusion  Result Date: 11/07/2021 CLINICAL DATA:  High probability for pulmonary embolism. Hypoxia. History of cancer, PE, and DVT. EXAM: NUCLEAR MEDICINE PERFUSION LUNG SCAN TECHNIQUE: Perfusion images were obtained in multiple projections after intravenous injection of radiopharmaceutical. Ventilation scans intentionally deferred if perfusion scan and chest x-ray adequate for interpretation during COVID 19 epidemic. RADIOPHARMACEUTICALS:  3.8 mCi Tc-74mMAA IV COMPARISON:  Same day chest radiograph at 0702 hours; CT chest 11/06/2021 FINDINGS: Comparison chest radiograph demonstrates enlargement of the cardiac silhouette and bilateral small layering pleural effusions with associated subsegmental bibasilar atelectasis. Perfusion imaging demonstrates subsegmental perfusion abnormalities in the anterior segment of the right upper lobe and superior segment of the right lower lobe. Non segmental perfusion defects at the costophrenic angles and within the fissures, corresponding to pleural fluid on chest radiograph and CT chest. Additional non segmental perfusion defect at the apical segment of the right upper lobe secondary to pleural thickening at the right apex, seen on CT  chest 11/06/2021. IMPRESSION: Two subsegmental perfusion abnormalities in the right lung with additional non-segmental perfusion defects related to pleural fluid and pleural thickening seen on recent chest imaging. Per modified PIOPED II criteria, findings are very low probability for pulmonary embolism. Electronically Signed   By: Ileana Roup M.D.   On: 11/07/2021 13:57   DG Chest 2 View  Result Date: 11/07/2021 CLINICAL DATA:   Fever and chills.  Weakness.  Sepsis. EXAM: CHEST - 2 VIEW COMPARISON:  11/06/2021 FINDINGS: 0702 hours. The cardio pericardial silhouette is enlarged. Interval increase in ground-glass attenuation in the left mid lung, nonspecific but likely related to layering pleural fluid. There is persistent bibasilar atelectasis, left greater than right with small bilateral pleural effusions. Telemetry leads overlie the chest. IMPRESSION: No substantial change in the left greater than right basilar atelectasis with bilateral small pleural effusions. Electronically Signed   By: Misty Stanley M.D.   On: 11/07/2021 07:15   CT CHEST ABDOMEN PELVIS WO CONTRAST  Result Date: 11/06/2021 CLINICAL DATA:  Sepsis. Fever and weakness. Radiologic records indicates non-small cell lung cancer. EXAM: CT CHEST, ABDOMEN AND PELVIS WITHOUT CONTRAST TECHNIQUE: Multidetector CT imaging of the chest, abdomen and pelvis was performed following the standard protocol without IV contrast. RADIATION DOSE REDUCTION: This exam was performed according to the departmental dose-optimization program which includes automated exposure control, adjustment of the mA and/or kV according to patient size and/or use of iterative reconstruction technique. COMPARISON:  Chest radiograph earlier today. PET CT 10/14/2021. Chest abdomen pelvis CT 08/02/2021 FINDINGS: CT CHEST FINDINGS Cardiovascular: Minimal aortic atherosclerosis and tortuosity. No aneurysm. Mild cardiomegaly. Trace pericardial effusion. There are coronary artery calcifications Mediastinum/Nodes: Breathing motion artifact limits assessment. Few scattered mediastinal lymph nodes with increased density are stable or slightly diminished in size from prior exam. No axillary adenopathy. Hilar assessment is limited on this unenhanced exam. No esophageal wall thickening. Lungs/Pleura: Small bilateral pleural effusions have minimally increased in size from prior chest CT. Minimal fluid in the right minor  fissure. Right lower lobe nodule measures 9 x 6 mm, series 4, image 87, previously 7 x 7 mm. Size differences are likely due to differences in caliper placement. Low-density extrapleural structure posteriorly abutting right ribs spans 2.4 x 1 cm series 3, image 34, previously 3.4 x 2 cm. The measures fluid density and may represent loculated pleural fluid. This was not hypermetabolic on intervening PET. Right apical pleural thickening measuring up to 9 mm, unchanged allowing for differences in caliper placement. Chronic interstitial thickening in the mid lower chest is unchanged. Mild emphysema with chronic bronchial thickening. No acute airspace disease or pneumonia. Musculoskeletal: Widespread bony metastatic disease with numerous sclerotic foci throughout the ribs and thoracic spine. There is no evidence of acute pathologic fracture. This was recently assessed with thoracic spine MRI CT ABDOMEN PELVIS FINDINGS Hepatobiliary: No evidence of focal lesion on this unenhanced exam. Small stones or sludge in the dependent gallbladder, no pericholecystic inflammation. Pancreas: Motion artifact limits assessment, no evidence of pancreatic inflammation. Unchanged coarse calcification measuring 17 mm in the pancreatic head. Chronic pancreatic ductal dilatation which is obscured due to motion. Spleen: Normal in size without focal abnormality. Adrenals/Urinary Tract: No adrenal nodule. Nonobstructing stone in the right kidney. No hydronephrosis. Motion artifact limits detailed renal assessment. No obvious renal inflammation. There is a small right-sided bladder diverticulum. Urinary bladder is otherwise unremarkable. Stomach/Bowel: Right inguinal hernia containing small bowel loops is not entirely included in the field of view. There is no evidence of associated  obstruction or inflammation. The stomach is decompressed and not well assessed. Detailed bowel assessment is limited due to motion and lack contrast. Moderate volume  of colonic stool. Vascular/Lymphatic: Aortic atherosclerosis without aneurysm. IVC filter in place. There is no bulky abdominopelvic adenopathy. Reproductive: Unremarkable prostate. Other: Mild generalized body wall edema. No ascites. Moderate-sized right inguinal hernia containing small bowel, not entirely included in the field of view. No free intra-abdominal air. Musculoskeletal: Widespread bony metastatic disease, stable from prior imaging. L4 inferior endplate compression fracture is unchanged from recent lumbar MRI. No evidence of acute pathologic fracture. IMPRESSION: 1. Small bilateral pleural effusions have minimally increased in size from prior chest CT. 2. Mild body wall edema. Otherwise no acute findings in the chest, abdomen, or pelvis. 3. Right pleural thickening is again seen. Extrapleural low-density in the right hemithorax is diminished from prior chest CT, and was not hypermetabolic on prior PET. Right lower lobe pulmonary nodule is unchanged allowing for differences in caliper placement. Attention to this at follow-up recommended. 4. Similar interstitial thickening in the mid lower lung zones, may represent chronic edema. 5. Widespread bony metastatic disease, grossly stable from prior imaging. Mild L4 inferior endplate compression fracture was present on 10/28/2021 lumbar MRI. 6. Right inguinal hernia containing small bowel loops, not entirely included in the field of view. No evidence of obstruction or inflammation. 7. Nonobstructing right nephrolithiasis. 8. Incidental note of gallstones or sludge in the dependent gallbladder without evidence of cholecystitis. Aortic Atherosclerosis (ICD10-I70.0) and Emphysema (ICD10-J43.9). Electronically Signed   By: Keith Rake M.D.   On: 11/06/2021 18:17   DG Chest 2 View  Result Date: 11/06/2021 CLINICAL DATA:  Suspected sepsis.  Fever and chills EXAM: CHEST - 2 VIEW COMPARISON:  Chest 06/19/2021 FINDINGS: Heart size and vascularity normal.   Negative for heart failure. Negative for pneumonia. Multiple left rib fractures. Blunting left costophrenic angle unchanged likely due to pleural scarring or small effusion. IMPRESSION: 1. No acute cardiopulmonary abnormality. 2. Multiple left rib fractures. Chronic blunting left costophrenic angle. Electronically Signed   By: Franchot Gallo M.D.   On: 11/06/2021 15:48   MR Lumbar Spine W Wo Contrast  Result Date: 10/31/2021 CLINICAL DATA:  81 year old male with a history of metastatic non-small cell lung cancer. Treated bone metastases. Left foot drop earlier this year. T11 metastasis. Restaging. EXAM: MRI LUMBAR SPINE WITHOUT AND WITH CONTRAST TECHNIQUE: Multiplanar and multiecho pulse sequences of the lumbar spine were obtained without and with intravenous contrast. CONTRAST:  7.5 mL Vueway COMPARISON:  Thoracic spine MRI from the same day reported separately. PET-CT 08/15/2021. Prior lumbar MRI 06/15/2021. FINDINGS: Segmentation: Normal on the prior CTs. This is concordant with the thoracic spine numbering today. Alignment: Stable lumbar lordosis since July. Mild chronic grade 1 anterolisthesis of L5 on S1. Vertebrae: New L4 inferior endplate compression fracture since July. L4 vertebral body loss of height 22%. Patchy marrow edema and enhancement subjacent to the fractured endplate. Associated progression of L4-L5 posterior disc osteophyte complex, with mild associated posteroinferior endplate retropulsion. See additional L4-L5 details below. But largely normal background L4 vertebral body marrow signal. As on the thoracic spine the same day (reported separately), multifocal sclerotic lumbar vertebral metastases are much more apparent on CT than MRI. No other marrow edema. No destructive osseous lesion identified. Intact visible sacrum and SI joints. Conus medullaris and cauda equina: Conus extends to the L1 level. No lower spinal cord or conus signal abnormality. No abnormal intradural enhancement  identified (mild fatty filum terminalis, normal variant).  No dural thickening or enhancement. Except for spinal stenosis at L4-L5 (see below), cauda equina nerve roots appear within normal limits. Paraspinal and other soft tissues: Stable since July. Disc levels: Disc spaces from the visible lower thoracic spine through L3-L4 are stable since the May MRI. L4-L5: Progressed chronic circumferential disc osteophyte complex with a bulky posterior component in the setting of L4 inferior endplate compression fracture. Bulky facet and ligament flavum hypertrophy. Severe spinal stenosis and bilateral lateral recess stenosis (L5 nerve levels). Moderate bilateral L4 foraminal stenosis. This level is mildly progressed. L5-S1: Chronic grade 1 spondylolisthesis with disc bulging/pseudo disc and severe facet hypertrophy. New degenerative facet joint fluid bilaterally. But no spinal stenosis. Mild bilateral lateral recess stenosis (S1 nerve levels), mild left and moderate right L5 neural foraminal stenosis are stable. IMPRESSION: 1. Acute to subacute L4 compression fracture is new since July. Inferior endplate deformity with 22% loss of height, mild to moderate marrow edema, and some posterior retropulsion of both disc and bone exacerbating already severe multifactorial L4-L5 spinal and lateral recess stenosis. However, favor this is a benign osteoporotic fracture - see #2. 2. Elsewhere treated/quiescent MRI appearance of lumbar vertebral metastases, similar to the Thoracic MRI reported separately today. 3. Progressed chronic facet arthropathy at L5-S1 since May, probably reactive to #1. Other lumbar spine degeneration remains stable. Electronically Signed   By: Genevie Ann M.D.   On: 10/31/2021 13:53   MR THORACIC SPINE W WO CONTRAST  Result Date: 10/31/2021 CLINICAL DATA:  81 year old male with a history of metastatic non-small cell lung cancer. Treated bone metastases. Left foot drop earlier this year. T11 metastasis.  Restaging. EXAM: MRI THORACIC WITHOUT AND WITH CONTRAST TECHNIQUE: Multiplanar and multiecho pulse sequences of the thoracic spine were obtained without and with intravenous contrast. CONTRAST:  7.5 mL Vueway COMPARISON:  PET-CT 08/15/2021 and earlier. FINDINGS: Limited cervical spine imaging: Grossly negative for age. Visible cervical bone marrow signal appears to be normal. Thoracic spine segmentation: Normal on prior CTs, hypoplastic 12th ribs. Alignment: Mildly exaggerated upper thoracic kyphosis is stable since July. No significant thoracic spondylolisthesis or scoliosis. Vertebrae: Widespread flowing thoracic endplate osteophytes resulting in thoracic interbody ankylosis better demonstrated by CT. And scattered sclerotic lesions in the thoracic spine also are largely not apparent by MRI. Largely normal marrow signal at the T2 body (subtle increased STIR signal) which demonstrates conspicuous sclerosis by CT. And there is only mild heterogeneity of the T11 posterior vertebral body where sclerotic metastasis is evident by CT (see series 28, image 10 and series 5, image 11. Nearby T11 endplate irregularity appears to be degenerative, Schmorl's node. No confluent thoracic vertebral marrow edema or enhancement. Cord: Within normal limits. No convincing thoracic spinal cord edema or myelomalacia. No convincing abnormal thoracic intradural enhancement or dural thickening. Conus medullaris is at T12-L1. Paraspinal and other soft tissues: Areas of T2 hyperintense pleural thickening and/or loculated pleural fluid appears stable to decreased since July, however, some of these areas do demonstrate associated pleural thickening and nodular enhancement (series 29, image 11). Other thoracic paraspinal soft tissues are within normal limits. Visible chest and upper abdominal viscera appear stable. Disc levels: Mild for age thoracic spine degeneration. No significant thoracic spinal stenosis. No significant thoracic neural  foraminal stenosis. IMPRESSION: 1. MRI appearance compatible with treated/quiescent thoracic vertebral metastases, which were much more apparent on the prior CTs (sclerotic). 2. No thoracic spinal cord metastatic disease. And mild for age thoracic spine degeneration with no spinal or foraminal stenosis. 3. Visible thorax and  upper abdomen appear stable since July PET-CT. Electronically Signed   By: Genevie Ann M.D.   On: 10/31/2021 13:52   MR FEMUR LEFT W WO CONTRAST  Result Date: 10/31/2021 CLINICAL DATA:  Non-small cell lung cancer metastatic workup. Prior DVT and hematoma EXAM: MR OF THE LEFT LOWER EXTREMITY WITHOUT AND WITH CONTRAST TECHNIQUE: Multiplanar, multisequence MR imaging of the left thigh/femur was performed both before and after administration of intravenous contrast. CONTRAST:  7.5 cc Vueway COMPARISON:  CT scan 06/20/2021 FINDINGS: Bones/Joint/Cartilage Signal heterogeneity and boundary artifact in the vicinity of the hip and knee. The hip and knee are distorted and poorly characterized. No discrete osseous metastatic lesion observed. Ligaments N/A Muscles and Tendons Complex collection in the left biceps femoris muscle with low T1 and T2 signal margin probably from hemosiderin deposition measures 3.8 by 2.2 by 13.8 cm (volume = 60 cm^3). On the CT of 05/31/2021 this measured 6.6 by 5.1 by 19.1 cm (volume = 340 cm^3). There is only a thin margin of enhancement. Appearance compatible with substantially improved intramuscular hematoma. The degree of associated displacement of the sciatic nerve is substantially improved. Soft tissues Subcutaneous edema in the thigh, primarily anterolaterally. In the distal thigh the subcutaneous edema is more circumferential. No drainable collection in the subcutaneous tissues. IMPRESSION: 1. Reduction in size of the intramuscular hematoma in the left biceps femoris muscle in the thigh, currently 60 cc and previously 340 cc on 06/20/2021. The degree of associated  displacement of the sciatic nerve is substantially improved. 2. Subcutaneous edema in the thigh. 3. No discrete metastatic lesion observed. 4. The hip and knee are distorted and poorly characterized. Electronically Signed   By: Van Clines M.D.   On: 10/31/2021 11:01    ASSESSMENT AND PLAN: This is a very pleasant 81 years old [place] male diagnosed with Stage IV (T1b, N3, M1 C) non-small cell lung cancer, adenocarcinoma with positive ALK gene translocation in February 2023. The patient is currently undergoing treatment with Alecensa (Alectinib) 600 mg p.o. twice daily.  He has been tolerating the treatment well with no concerning complaints.  He is status post 7.5 months of treatment.  This treatment was interrupted for 2 weeks secondary to the venous thrombosis and hospitalization.   The patient has been tolerating his treatment with Alecensa (Alectinib) fairly well. I recommended for him to continue his treatment with the same dose for now. For the history of hemolytic anemia/iron deficiency, he will continue with the oral iron tablet with fusion plus.  He is also on a tapered dose of prednisone for the hemolysis which likely to be secondary to IVC filter and mechanical distraction. For the history of deep venous thrombosis and pulmonary embolism, he is currently have IVC filter placed and on treatment with Plavix.  I recommended for the patient to resume his treatment with Eliquis and I will consider removing the IVC filter in around 2 weeks. I will see the patient back for follow-up visit in 4 weeks for evaluation with repeat blood work. The patient voices understanding of current disease status and treatment options and is in agreement with the current care plan. He was advised to call immediately if he has any other concerning symptoms in the interval. All questions were answered. The patient knows to call the clinic with any problems, questions or concerns. We can certainly see the patient  much sooner if necessary.  The total time spent in the appointment was 30 minutes.  Disclaimer: This note was dictated with  voice recognition software. Similar sounding words can inadvertently be transcribed and may not be corrected upon review.

## 2021-11-28 ENCOUNTER — Other Ambulatory Visit: Payer: Self-pay

## 2021-11-30 DIAGNOSIS — I10 Essential (primary) hypertension: Secondary | ICD-10-CM | POA: Diagnosis not present

## 2021-11-30 DIAGNOSIS — I2510 Atherosclerotic heart disease of native coronary artery without angina pectoris: Secondary | ICD-10-CM | POA: Diagnosis not present

## 2021-11-30 DIAGNOSIS — I69990 Apraxia following unspecified cerebrovascular disease: Secondary | ICD-10-CM | POA: Diagnosis not present

## 2021-11-30 DIAGNOSIS — I69354 Hemiplegia and hemiparesis following cerebral infarction affecting left non-dominant side: Secondary | ICD-10-CM | POA: Diagnosis not present

## 2021-11-30 DIAGNOSIS — Z86718 Personal history of other venous thrombosis and embolism: Secondary | ICD-10-CM | POA: Diagnosis not present

## 2021-11-30 DIAGNOSIS — E1142 Type 2 diabetes mellitus with diabetic polyneuropathy: Secondary | ICD-10-CM | POA: Diagnosis not present

## 2021-11-30 DIAGNOSIS — M1990 Unspecified osteoarthritis, unspecified site: Secondary | ICD-10-CM | POA: Diagnosis not present

## 2021-11-30 DIAGNOSIS — M21372 Foot drop, left foot: Secondary | ICD-10-CM | POA: Diagnosis not present

## 2021-11-30 DIAGNOSIS — I252 Old myocardial infarction: Secondary | ICD-10-CM | POA: Diagnosis not present

## 2021-11-30 DIAGNOSIS — M109 Gout, unspecified: Secondary | ICD-10-CM | POA: Diagnosis not present

## 2021-11-30 DIAGNOSIS — Z9181 History of falling: Secondary | ICD-10-CM | POA: Diagnosis not present

## 2021-11-30 DIAGNOSIS — C3491 Malignant neoplasm of unspecified part of right bronchus or lung: Secondary | ICD-10-CM | POA: Diagnosis not present

## 2021-11-30 DIAGNOSIS — D630 Anemia in neoplastic disease: Secondary | ICD-10-CM | POA: Diagnosis not present

## 2021-11-30 DIAGNOSIS — Z7902 Long term (current) use of antithrombotics/antiplatelets: Secondary | ICD-10-CM | POA: Diagnosis not present

## 2021-12-03 ENCOUNTER — Other Ambulatory Visit: Payer: Self-pay | Admitting: Internal Medicine

## 2021-12-03 ENCOUNTER — Other Ambulatory Visit (HOSPITAL_COMMUNITY): Payer: Self-pay

## 2021-12-03 DIAGNOSIS — E785 Hyperlipidemia, unspecified: Secondary | ICD-10-CM | POA: Diagnosis not present

## 2021-12-03 DIAGNOSIS — J439 Emphysema, unspecified: Secondary | ICD-10-CM | POA: Diagnosis not present

## 2021-12-03 DIAGNOSIS — M21372 Foot drop, left foot: Secondary | ICD-10-CM | POA: Diagnosis not present

## 2021-12-03 DIAGNOSIS — I700 Atherosclerosis of aorta: Secondary | ICD-10-CM | POA: Diagnosis not present

## 2021-12-03 DIAGNOSIS — R911 Solitary pulmonary nodule: Secondary | ICD-10-CM | POA: Diagnosis not present

## 2021-12-03 DIAGNOSIS — M1030 Gout due to renal impairment, unspecified site: Secondary | ICD-10-CM | POA: Diagnosis not present

## 2021-12-03 DIAGNOSIS — I2510 Atherosclerotic heart disease of native coronary artery without angina pectoris: Secondary | ICD-10-CM | POA: Diagnosis not present

## 2021-12-03 DIAGNOSIS — Z9981 Dependence on supplemental oxygen: Secondary | ICD-10-CM | POA: Diagnosis not present

## 2021-12-03 DIAGNOSIS — Z7902 Long term (current) use of antithrombotics/antiplatelets: Secondary | ICD-10-CM | POA: Diagnosis not present

## 2021-12-03 DIAGNOSIS — E1142 Type 2 diabetes mellitus with diabetic polyneuropathy: Secondary | ICD-10-CM | POA: Diagnosis not present

## 2021-12-03 DIAGNOSIS — E1122 Type 2 diabetes mellitus with diabetic chronic kidney disease: Secondary | ICD-10-CM | POA: Diagnosis not present

## 2021-12-03 DIAGNOSIS — C3491 Malignant neoplasm of unspecified part of right bronchus or lung: Secondary | ICD-10-CM | POA: Diagnosis not present

## 2021-12-03 DIAGNOSIS — D630 Anemia in neoplastic disease: Secondary | ICD-10-CM | POA: Diagnosis not present

## 2021-12-03 DIAGNOSIS — C7951 Secondary malignant neoplasm of bone: Secondary | ICD-10-CM | POA: Diagnosis not present

## 2021-12-03 DIAGNOSIS — M1990 Unspecified osteoarthritis, unspecified site: Secondary | ICD-10-CM | POA: Diagnosis not present

## 2021-12-03 DIAGNOSIS — M96A3 Multiple fractures of ribs associated with chest compression and cardiopulmonary resuscitation: Secondary | ICD-10-CM | POA: Diagnosis not present

## 2021-12-03 DIAGNOSIS — I252 Old myocardial infarction: Secondary | ICD-10-CM | POA: Diagnosis not present

## 2021-12-03 DIAGNOSIS — I130 Hypertensive heart and chronic kidney disease with heart failure and stage 1 through stage 4 chronic kidney disease, or unspecified chronic kidney disease: Secondary | ICD-10-CM | POA: Diagnosis not present

## 2021-12-03 DIAGNOSIS — I69354 Hemiplegia and hemiparesis following cerebral infarction affecting left non-dominant side: Secondary | ICD-10-CM | POA: Diagnosis not present

## 2021-12-03 DIAGNOSIS — Z955 Presence of coronary angioplasty implant and graft: Secondary | ICD-10-CM | POA: Diagnosis not present

## 2021-12-03 DIAGNOSIS — K219 Gastro-esophageal reflux disease without esophagitis: Secondary | ICD-10-CM | POA: Diagnosis not present

## 2021-12-03 DIAGNOSIS — D631 Anemia in chronic kidney disease: Secondary | ICD-10-CM | POA: Diagnosis not present

## 2021-12-03 DIAGNOSIS — N1831 Chronic kidney disease, stage 3a: Secondary | ICD-10-CM | POA: Diagnosis not present

## 2021-12-03 DIAGNOSIS — I509 Heart failure, unspecified: Secondary | ICD-10-CM | POA: Diagnosis not present

## 2021-12-03 DIAGNOSIS — D500 Iron deficiency anemia secondary to blood loss (chronic): Secondary | ICD-10-CM | POA: Diagnosis not present

## 2021-12-03 MED ORDER — ALECENSA 150 MG PO CAPS
600.0000 mg | ORAL_CAPSULE | Freq: Two times a day (BID) | ORAL | 3 refills | Status: DC
  Filled 2021-12-03: qty 240, 30d supply, fill #0
  Filled 2022-01-03: qty 240, 30d supply, fill #1
  Filled 2022-01-31: qty 240, 30d supply, fill #2
  Filled 2022-03-05: qty 240, 30d supply, fill #3

## 2021-12-04 ENCOUNTER — Other Ambulatory Visit: Payer: Self-pay

## 2021-12-04 ENCOUNTER — Other Ambulatory Visit (HOSPITAL_COMMUNITY): Payer: Self-pay

## 2021-12-06 ENCOUNTER — Other Ambulatory Visit: Payer: Self-pay

## 2021-12-07 ENCOUNTER — Other Ambulatory Visit: Payer: Self-pay | Admitting: Family Medicine

## 2021-12-07 ENCOUNTER — Other Ambulatory Visit: Payer: Self-pay | Admitting: Internal Medicine

## 2021-12-07 ENCOUNTER — Other Ambulatory Visit (HOSPITAL_COMMUNITY): Payer: Self-pay

## 2021-12-07 DIAGNOSIS — C3491 Malignant neoplasm of unspecified part of right bronchus or lung: Secondary | ICD-10-CM

## 2021-12-10 DIAGNOSIS — R911 Solitary pulmonary nodule: Secondary | ICD-10-CM | POA: Diagnosis not present

## 2021-12-10 DIAGNOSIS — M21372 Foot drop, left foot: Secondary | ICD-10-CM | POA: Diagnosis not present

## 2021-12-10 DIAGNOSIS — I509 Heart failure, unspecified: Secondary | ICD-10-CM | POA: Diagnosis not present

## 2021-12-10 DIAGNOSIS — N1831 Chronic kidney disease, stage 3a: Secondary | ICD-10-CM | POA: Diagnosis not present

## 2021-12-10 DIAGNOSIS — Z7902 Long term (current) use of antithrombotics/antiplatelets: Secondary | ICD-10-CM | POA: Diagnosis not present

## 2021-12-10 DIAGNOSIS — I2510 Atherosclerotic heart disease of native coronary artery without angina pectoris: Secondary | ICD-10-CM | POA: Diagnosis not present

## 2021-12-10 DIAGNOSIS — C7951 Secondary malignant neoplasm of bone: Secondary | ICD-10-CM | POA: Diagnosis not present

## 2021-12-10 DIAGNOSIS — E785 Hyperlipidemia, unspecified: Secondary | ICD-10-CM | POA: Diagnosis not present

## 2021-12-10 DIAGNOSIS — E1142 Type 2 diabetes mellitus with diabetic polyneuropathy: Secondary | ICD-10-CM | POA: Diagnosis not present

## 2021-12-10 DIAGNOSIS — M1030 Gout due to renal impairment, unspecified site: Secondary | ICD-10-CM | POA: Diagnosis not present

## 2021-12-10 DIAGNOSIS — D500 Iron deficiency anemia secondary to blood loss (chronic): Secondary | ICD-10-CM | POA: Diagnosis not present

## 2021-12-10 DIAGNOSIS — K219 Gastro-esophageal reflux disease without esophagitis: Secondary | ICD-10-CM | POA: Diagnosis not present

## 2021-12-10 DIAGNOSIS — J439 Emphysema, unspecified: Secondary | ICD-10-CM | POA: Diagnosis not present

## 2021-12-10 DIAGNOSIS — I700 Atherosclerosis of aorta: Secondary | ICD-10-CM | POA: Diagnosis not present

## 2021-12-10 DIAGNOSIS — Z9981 Dependence on supplemental oxygen: Secondary | ICD-10-CM | POA: Diagnosis not present

## 2021-12-10 DIAGNOSIS — D631 Anemia in chronic kidney disease: Secondary | ICD-10-CM | POA: Diagnosis not present

## 2021-12-10 DIAGNOSIS — I69354 Hemiplegia and hemiparesis following cerebral infarction affecting left non-dominant side: Secondary | ICD-10-CM | POA: Diagnosis not present

## 2021-12-10 DIAGNOSIS — M96A3 Multiple fractures of ribs associated with chest compression and cardiopulmonary resuscitation: Secondary | ICD-10-CM | POA: Diagnosis not present

## 2021-12-10 DIAGNOSIS — Z955 Presence of coronary angioplasty implant and graft: Secondary | ICD-10-CM | POA: Diagnosis not present

## 2021-12-10 DIAGNOSIS — D630 Anemia in neoplastic disease: Secondary | ICD-10-CM | POA: Diagnosis not present

## 2021-12-10 DIAGNOSIS — I252 Old myocardial infarction: Secondary | ICD-10-CM | POA: Diagnosis not present

## 2021-12-10 DIAGNOSIS — E1122 Type 2 diabetes mellitus with diabetic chronic kidney disease: Secondary | ICD-10-CM | POA: Diagnosis not present

## 2021-12-10 DIAGNOSIS — C3491 Malignant neoplasm of unspecified part of right bronchus or lung: Secondary | ICD-10-CM | POA: Diagnosis not present

## 2021-12-10 DIAGNOSIS — M1990 Unspecified osteoarthritis, unspecified site: Secondary | ICD-10-CM | POA: Diagnosis not present

## 2021-12-10 DIAGNOSIS — I130 Hypertensive heart and chronic kidney disease with heart failure and stage 1 through stage 4 chronic kidney disease, or unspecified chronic kidney disease: Secondary | ICD-10-CM | POA: Diagnosis not present

## 2021-12-11 DIAGNOSIS — M79606 Pain in leg, unspecified: Secondary | ICD-10-CM | POA: Diagnosis not present

## 2021-12-11 DIAGNOSIS — S7010XA Contusion of unspecified thigh, initial encounter: Secondary | ICD-10-CM | POA: Diagnosis not present

## 2021-12-13 ENCOUNTER — Other Ambulatory Visit (HOSPITAL_COMMUNITY): Payer: Self-pay

## 2021-12-13 ENCOUNTER — Telehealth: Payer: Self-pay | Admitting: Internal Medicine

## 2021-12-13 NOTE — Telephone Encounter (Signed)
Rescheduled per 11/30 appointment to 12/05 due to provider pal, patient has been called and notified.

## 2021-12-15 ENCOUNTER — Other Ambulatory Visit: Payer: Self-pay

## 2021-12-18 ENCOUNTER — Encounter: Payer: Self-pay | Admitting: Family Medicine

## 2021-12-18 ENCOUNTER — Ambulatory Visit (INDEPENDENT_AMBULATORY_CARE_PROVIDER_SITE_OTHER): Payer: Medicare Other | Admitting: Family Medicine

## 2021-12-18 ENCOUNTER — Other Ambulatory Visit: Payer: Self-pay

## 2021-12-18 VITALS — BP 128/75 | HR 78 | Ht 71.0 in | Wt 157.4 lb

## 2021-12-18 DIAGNOSIS — M21372 Foot drop, left foot: Secondary | ICD-10-CM

## 2021-12-18 DIAGNOSIS — E1169 Type 2 diabetes mellitus with other specified complication: Secondary | ICD-10-CM

## 2021-12-18 DIAGNOSIS — Z23 Encounter for immunization: Secondary | ICD-10-CM | POA: Diagnosis not present

## 2021-12-18 DIAGNOSIS — I2510 Atherosclerotic heart disease of native coronary artery without angina pectoris: Secondary | ICD-10-CM | POA: Diagnosis not present

## 2021-12-18 DIAGNOSIS — E782 Mixed hyperlipidemia: Secondary | ICD-10-CM

## 2021-12-18 DIAGNOSIS — I10 Essential (primary) hypertension: Secondary | ICD-10-CM | POA: Diagnosis not present

## 2021-12-18 LAB — POCT GLYCOSYLATED HEMOGLOBIN (HGB A1C): HbA1c, POC (controlled diabetic range): 5.6 % (ref 0.0–7.0)

## 2021-12-18 NOTE — Assessment & Plan Note (Signed)
No chest pain or angina symptoms on no antianginals with normal blood pressure.  Continue to observe.  Check lipids today with aim of LDL < 70.  Continue asa and plavix

## 2021-12-18 NOTE — Patient Instructions (Signed)
Good to see you today - Thank you for coming in  Things we discussed today:  I will call you if your tests are not good.  Otherwise, I will send you a message on MyChart (if it is active) or a letter in the mail..  If you do not hear from me with in 2 weeks please call our office.     Keep exercising and eating to gain more weight   See Dr Earlie Server about the filter removal   Please always bring your medication bottles  Come back to see me in 6 months

## 2021-12-18 NOTE — Progress Notes (Signed)
SUBJECTIVE:   CHIEF COMPLAINT / HPI:   Diabetes Eating usual diet but not as much due to loss of taste appetite  Hypertension On no medications.  No chest pain with exertion or lightheadness   Lung Cancer  DVT  Anemia Dr Earlie Server visit on 10/31 "his is a very pleasant 81 years old [place] male diagnosed with Stage IV (T1b, N3, M1 C) non-small cell lung cancer, adenocarcinoma with positive ALK gene translocation in February 2023. The patient is currently undergoing treatment with Alecensa (Alectinib) 600 mg p.o. twice daily.  He has been tolerating the treatment well with no concerning complaints.  He is status post 7.5 months of treatment.  This treatment was interrupted for 2 weeks secondary to the venous thrombosis and hospitalization.   The patient has been tolerating his treatment with Alecensa (Alectinib) fairly well. I recommended for him to continue his treatment with the same dose for now. For the history of hemolytic anemia/iron deficiency, he will continue with the oral iron tablet with fusion plus.  He is also on a tapered dose of prednisone for the hemolysis which likely to be secondary to IVC filter and mechanical distraction. For the history of deep venous thrombosis and pulmonary embolism, he is currently have IVC filter placed and on treatment with Plavix.  I recommended for the patient to resume his treatment with Eliquis and I will consider removing the IVC filter in around 2 weeks.   OBJECTIVE:   BP 128/75   Pulse 78   Ht _0  (1.803 m)   Wt 157 lb 6.4 oz (71.4 kg)   SpO2 96%   BMI 21.95 kg/m   Alert sitting in WC.  Can stand and get on the scale on his own Heart - Regular rate and rhythm.  No murmurs, gallops or rubs.    Lungs:  Normal respiratory effort, chest expands symmetrically. Lungs are clear to auscultation, no crackles or wheezes. Extrem - trace edema bilaterally with L lower extremity in a brace  ASSESSMENT/PLAN:   Type 2 diabetes mellitus with  other specified complication, without long-term current use of insulin ([place]) Assessment & Plan: Well controlled off all medications.  He is slowly losing weight.  Encouraged to continue to eat to stabilize his weight   Orders: -     POCT glycosylated hemoglobin (Hb A1C)  Mixed hyperlipidemia -     Lipid panel  Essential hypertension, benign Assessment & Plan: Well controlled off all medications.   Continue to monitor    Coronary artery disease involving native coronary artery of native heart without angina pectoris Assessment & Plan: No chest pain or angina symptoms on no antianginals with normal blood pressure.  Continue to observe.  Check lipids today with aim of LDL < 70.  Continue asa and plavix    Foot drop, left foot Assessment & Plan: Was seen by neurology who apparently suggested work up with more imaging.  Will likely wait until his anticoagulation is stable       Patient Instructions  Good to see you today - Thank you for coming in  Things we discussed today:  I will call you if your tests are not good.  Otherwise, I will send you a message on MyChart (if it is active) or a letter in the mail..  If you do not hear from me with in 2 weeks please call our office.     Keep exercising and eating to gain more weight   See Dr Earlie Server  about the filter removal   Please always bring your medication bottles  Come back to see me in 6 months    Lind Covert, [place]

## 2021-12-18 NOTE — Assessment & Plan Note (Signed)
Well controlled off all medications.   Continue to monitor

## 2021-12-18 NOTE — Assessment & Plan Note (Signed)
Well controlled off all medications.  He is slowly losing weight.  Encouraged to continue to eat to stabilize his weight

## 2021-12-18 NOTE — Assessment & Plan Note (Signed)
Was seen by neurology who apparently suggested work up with more imaging.  Will likely wait until his anticoagulation is stable

## 2021-12-19 ENCOUNTER — Other Ambulatory Visit: Payer: Self-pay | Admitting: Family Medicine

## 2021-12-19 LAB — LIPID PANEL
Chol/HDL Ratio: 2.3 ratio (ref 0.0–5.0)
Cholesterol, Total: 160 mg/dL (ref 100–199)
HDL: 71 mg/dL
LDL Chol Calc (NIH): 79 mg/dL (ref 0–99)
Triglycerides: 47 mg/dL (ref 0–149)
VLDL Cholesterol Cal: 10 mg/dL (ref 5–40)

## 2021-12-22 DIAGNOSIS — N1831 Chronic kidney disease, stage 3a: Secondary | ICD-10-CM | POA: Diagnosis not present

## 2021-12-22 DIAGNOSIS — E785 Hyperlipidemia, unspecified: Secondary | ICD-10-CM | POA: Diagnosis not present

## 2021-12-22 DIAGNOSIS — I130 Hypertensive heart and chronic kidney disease with heart failure and stage 1 through stage 4 chronic kidney disease, or unspecified chronic kidney disease: Secondary | ICD-10-CM | POA: Diagnosis not present

## 2021-12-22 DIAGNOSIS — I700 Atherosclerosis of aorta: Secondary | ICD-10-CM | POA: Diagnosis not present

## 2021-12-22 DIAGNOSIS — K219 Gastro-esophageal reflux disease without esophagitis: Secondary | ICD-10-CM | POA: Diagnosis not present

## 2021-12-22 DIAGNOSIS — D500 Iron deficiency anemia secondary to blood loss (chronic): Secondary | ICD-10-CM | POA: Diagnosis not present

## 2021-12-22 DIAGNOSIS — C3491 Malignant neoplasm of unspecified part of right bronchus or lung: Secondary | ICD-10-CM | POA: Diagnosis not present

## 2021-12-22 DIAGNOSIS — Z7902 Long term (current) use of antithrombotics/antiplatelets: Secondary | ICD-10-CM | POA: Diagnosis not present

## 2021-12-22 DIAGNOSIS — J439 Emphysema, unspecified: Secondary | ICD-10-CM | POA: Diagnosis not present

## 2021-12-22 DIAGNOSIS — E1142 Type 2 diabetes mellitus with diabetic polyneuropathy: Secondary | ICD-10-CM | POA: Diagnosis not present

## 2021-12-22 DIAGNOSIS — M1030 Gout due to renal impairment, unspecified site: Secondary | ICD-10-CM | POA: Diagnosis not present

## 2021-12-22 DIAGNOSIS — M96A3 Multiple fractures of ribs associated with chest compression and cardiopulmonary resuscitation: Secondary | ICD-10-CM | POA: Diagnosis not present

## 2021-12-22 DIAGNOSIS — I69354 Hemiplegia and hemiparesis following cerebral infarction affecting left non-dominant side: Secondary | ICD-10-CM | POA: Diagnosis not present

## 2021-12-22 DIAGNOSIS — D631 Anemia in chronic kidney disease: Secondary | ICD-10-CM | POA: Diagnosis not present

## 2021-12-22 DIAGNOSIS — Z955 Presence of coronary angioplasty implant and graft: Secondary | ICD-10-CM | POA: Diagnosis not present

## 2021-12-22 DIAGNOSIS — D630 Anemia in neoplastic disease: Secondary | ICD-10-CM | POA: Diagnosis not present

## 2021-12-22 DIAGNOSIS — R911 Solitary pulmonary nodule: Secondary | ICD-10-CM | POA: Diagnosis not present

## 2021-12-22 DIAGNOSIS — M21372 Foot drop, left foot: Secondary | ICD-10-CM | POA: Diagnosis not present

## 2021-12-22 DIAGNOSIS — I509 Heart failure, unspecified: Secondary | ICD-10-CM | POA: Diagnosis not present

## 2021-12-22 DIAGNOSIS — I2510 Atherosclerotic heart disease of native coronary artery without angina pectoris: Secondary | ICD-10-CM | POA: Diagnosis not present

## 2021-12-22 DIAGNOSIS — I252 Old myocardial infarction: Secondary | ICD-10-CM | POA: Diagnosis not present

## 2021-12-22 DIAGNOSIS — C7951 Secondary malignant neoplasm of bone: Secondary | ICD-10-CM | POA: Diagnosis not present

## 2021-12-22 DIAGNOSIS — Z9981 Dependence on supplemental oxygen: Secondary | ICD-10-CM | POA: Diagnosis not present

## 2021-12-22 DIAGNOSIS — M1990 Unspecified osteoarthritis, unspecified site: Secondary | ICD-10-CM | POA: Diagnosis not present

## 2021-12-22 DIAGNOSIS — E1122 Type 2 diabetes mellitus with diabetic chronic kidney disease: Secondary | ICD-10-CM | POA: Diagnosis not present

## 2021-12-24 ENCOUNTER — Other Ambulatory Visit: Payer: Medicare Other

## 2021-12-24 ENCOUNTER — Other Ambulatory Visit: Payer: Self-pay | Admitting: Interventional Radiology

## 2021-12-24 ENCOUNTER — Ambulatory Visit
Admission: RE | Admit: 2021-12-24 | Discharge: 2021-12-24 | Disposition: A | Discharge status: Home / Self Care | Payer: Medicare Other | Source: Ambulatory Visit | Attending: Internal Medicine | Admitting: Internal Medicine

## 2021-12-24 DIAGNOSIS — C3491 Malignant neoplasm of unspecified part of right bronchus or lung: Secondary | ICD-10-CM

## 2021-12-24 DIAGNOSIS — Z4689 Encounter for fitting and adjustment of other specified devices: Secondary | ICD-10-CM | POA: Diagnosis not present

## 2021-12-24 DIAGNOSIS — Z86718 Personal history of other venous thrombosis and embolism: Secondary | ICD-10-CM | POA: Diagnosis not present

## 2021-12-24 HISTORY — PX: IR RADIOLOGIST EVAL & MGMT: IMG5224

## 2021-12-24 NOTE — Consult Note (Signed)
Chief Complaint: Patient was seen in consultation today for IVC filter retrieval  Referring Physician(s): Mohamed,Mohamed  History of Present Illness: Arthur [name] is a 81 y.o. male with history of advanced stage non-small cell lung cancer, diagnosed in February 2023 who presented at outside hospital in April 2023 with left popliteal vein deep vein thrombosis with concomitant left thigh intramuscular hematoma, therefore was unable to receive anticoagulation and an Option infrarenal IVC filter was placed (Dr. Maudie Mercury, [place]).  He was later started on lovenox and plavix, then transitioned to 2.5 mg Eliquis BID and continued 75 mg Plavix.  He has tolerated this well.  Unfortunately he has suffered neurologic insult to the LLE and is unable to ambulate.  He is interested in having the filter removed.  Past Medical History:  Diagnosis Date   Diabetes mellitus without complication ([place])    High cholesterol    Hypertension    lung ca    Normal nuclear stress test 01/29/2008   stress perfusion study apparently in 2010 in [facility] which he said was negative.    Past Surgical History:  Procedure Laterality Date   CATARACT EXTRACTION, BILATERAL  2006   CORONARY STENT INTERVENTION N/A 02/06/2021   Procedure: CORONARY STENT INTERVENTION;  Surgeon: Jettie Booze, MD;  Location: [place] [facility];  Service: Cardiovascular;  Laterality: N/A;   FINE NEEDLE ASPIRATION  03/21/2021   Procedure: FINE NEEDLE ASPIRATION;  Surgeon: Garner Nash, DO;  Location: [facility];  Service: Pulmonary;;   INTRAVASCULAR ULTRASOUND/IVUS N/A 02/06/2021   Procedure: Intravascular Ultrasound/IVUS;  Surgeon: Jettie Booze, MD;  Location: [place] [facility];  Service: Cardiovascular;  Laterality: N/A;   KNEE SURGERY  2005   LEFT HEART CATH AND CORONARY ANGIOGRAPHY N/A 02/06/2021   Procedure: LEFT HEART CATH AND CORONARY ANGIOGRAPHY;  Surgeon: Jettie Booze, MD;  Location: [facility];  Service: Cardiovascular;  Laterality: N/A;   VIDEO BRONCHOSCOPY WITH ENDOBRONCHIAL ULTRASOUND Bilateral 03/21/2021   Procedure: VIDEO BRONCHOSCOPY WITH ENDOBRONCHIAL ULTRASOUND;  Surgeon: Garner Nash, DO;  Location: [facility];  Service: Pulmonary;  Laterality: Bilateral;    Allergies: Ace inhibitors and Iohexol  Medications: Prior to Admission medications   Medication Sig Start Date End Date Taking? Authorizing Provider  alectinib (ALECENSA) 150 MG capsule Take 4 capsules (600 mg total) by mouth 2 (two) times daily with a meal. 12/03/21   Curt Bears, MD  apixaban (ELIQUIS) 2.5 MG TABS tablet Take 2.5 mg by mouth 2 (two) times daily.    [provider]  clopidogrel (PLAVIX) 75 MG tablet Take 1 tablet (75 mg total) by mouth daily. 04/04/21   Lind Covert, MD  furosemide (LASIX) 20 MG tablet 1 tablet p.o. daily on as-needed basis for swelling of the lower extremities. 10/24/21   Curt Bears, MD  Iron-FA-B Cmp-C-Biot-Probiotic (FUSION PLUS) CAPS Take 1 capsule by mouth daily. 09/13/21   [provider]  omeprazole (PRILOSEC) 20 MG capsule Take 1 capsule (20 mg total) by mouth daily as needed. 11/07/21   Lind Covert, MD  rosuvastatin (CRESTOR) 5 MG tablet Take 2 tablets (10 mg total) by mouth daily. 12/19/21   Lind Covert, MD     Family History  Problem Relation Age of Onset   Coronary artery disease Father    Coronary artery disease Mother     Social History   Socioeconomic History   Marital status: Married    Spouse name: Not on file  Number of children: Not on file   Years of education: Not on file   Highest education level: Not on file  Occupational History   Not on file  Tobacco Use   Smoking status: Never   Smokeless tobacco: Never  Substance and Sexual Activity   Alcohol use: No   Drug use: No   Sexual activity: Not on file  Other Topics Concern   Not on file  Social History Narrative    Social History:   He works part-time as a Doctor, general practice. He is married with 3 children. He's never smoked cigarettes and does not drink alcohol.            Social Determinants of Health   Financial Resource Strain: Not on file  Food Insecurity: No Food Insecurity (11/12/2021)   Hunger Vital Sign    Worried About Running Out of Food in the Last Year: Never true    Ran Out of Food in the Last Year: Never true  Transportation Needs: No Transportation Needs (11/12/2021)   PRAPARE - Hydrologist (Medical): No    Lack of Transportation (Non-Medical): No  Physical Activity: Not on file  Stress: Not on file  Social Connections: Not on file    Review of Systems: A 12 point ROS discussed and pertinent positives are indicated in the HPI above.  All other systems are negative.  Vital Signs: There were no vitals taken for this visit.  Advance Care Plan: The advanced care plan/surrogate decision maker was discussed at the time of visit and documented in the medical record.   No physical examination was performed in lieu of virtual telephone clinic visit.  Imaging:     CT CAP 11/06/21  Anteromedial tilt, however hook does not appear embedded.  Labs:  CBC: Recent Labs    11/09/21 0827 11/10/21 0354 11/14/21 0819 11/27/21 0836  WBC 12.1* 8.9 14.1* 9.2  HGB 9.9* 8.0* 9.4* 9.0*  HCT 29.7* 22.9* 27.5* 26.7*  PLT 350 298 358 235    COAGS: Recent Labs    02/03/21 2119 03/19/21 0121 03/20/21 0223 03/20/21 1237 03/20/21 2226 03/21/21 0159 11/06/21 1530  INR 1.3*  --   --   --   --   --  1.4*  APTT 33   < > 122* 47* 65* 62*  --    < > = values in this interval not displayed.    BMP: Recent Labs    11/09/21 0827 11/10/21 0354 11/14/21 0819 11/27/21 0836  NA 138 139 140 141  K 4.2 3.2* 4.3 3.5  CL 105 106 103 100  CO2 24 27 32 36*  GLUCOSE 141* 235* 100* 90  BUN 19 22 21  24*  CALCIUM 8.6* 8.7* 9.1 8.6*  CREATININE 1.25* 1.22 1.13 1.53*   GFRNONAA 58* 60* >60 45*    LIVER FUNCTION TESTS: Recent Labs    11/07/21 1048 11/08/21 0349 11/14/21 0819 11/27/21 0836  BILITOT 1.8* 1.2 2.4* 2.9*  AST 37 29 23 37  ALT 18 17 19  32  ALKPHOS 74 68 88 66  PROT 5.2* 5.2* 6.2* 6.2*  ALBUMIN 2.2* 2.2* 3.2* 3.3*    TUMOR MARKERS: No results for input(s): "AFPTM", "CEA", "CA199", "CHROMGRNA" in the last 8760 hours.  Assessment and Plan: 81 year old male with history of left popliteal deep vein thrombosis status post IVC filter placement on 04/28/21, now tolerating anticoagulation without any clinical evidence of significant residual or progressive DVT.  He was scheduled to  have a repeat DVT US performed today in concert with clinic visit, however due to sonographer staffing issues this was cancelled.    We will proceed with scheduling of IVC filter retrieval, DVT US to be performed on 01/01/22.    Electronically Signed: Suzette Battiest, MD 12/24/2021, 12:47 PM   I spent a total of  30 Minutes  in virtual telephone clinical consultation, greater than 50% of which was counseling/coordinating care for IVC filter.

## 2021-12-25 DIAGNOSIS — E1142 Type 2 diabetes mellitus with diabetic polyneuropathy: Secondary | ICD-10-CM | POA: Diagnosis not present

## 2021-12-25 DIAGNOSIS — C3491 Malignant neoplasm of unspecified part of right bronchus or lung: Secondary | ICD-10-CM | POA: Diagnosis not present

## 2021-12-25 DIAGNOSIS — E785 Hyperlipidemia, unspecified: Secondary | ICD-10-CM | POA: Diagnosis not present

## 2021-12-25 DIAGNOSIS — M1030 Gout due to renal impairment, unspecified site: Secondary | ICD-10-CM | POA: Diagnosis not present

## 2021-12-25 DIAGNOSIS — M96A3 Multiple fractures of ribs associated with chest compression and cardiopulmonary resuscitation: Secondary | ICD-10-CM | POA: Diagnosis not present

## 2021-12-25 DIAGNOSIS — R911 Solitary pulmonary nodule: Secondary | ICD-10-CM | POA: Diagnosis not present

## 2021-12-25 DIAGNOSIS — I130 Hypertensive heart and chronic kidney disease with heart failure and stage 1 through stage 4 chronic kidney disease, or unspecified chronic kidney disease: Secondary | ICD-10-CM | POA: Diagnosis not present

## 2021-12-25 DIAGNOSIS — E1122 Type 2 diabetes mellitus with diabetic chronic kidney disease: Secondary | ICD-10-CM | POA: Diagnosis not present

## 2021-12-25 DIAGNOSIS — Z7902 Long term (current) use of antithrombotics/antiplatelets: Secondary | ICD-10-CM | POA: Diagnosis not present

## 2021-12-25 DIAGNOSIS — I509 Heart failure, unspecified: Secondary | ICD-10-CM | POA: Diagnosis not present

## 2021-12-25 DIAGNOSIS — I69354 Hemiplegia and hemiparesis following cerebral infarction affecting left non-dominant side: Secondary | ICD-10-CM | POA: Diagnosis not present

## 2021-12-25 DIAGNOSIS — I252 Old myocardial infarction: Secondary | ICD-10-CM | POA: Diagnosis not present

## 2021-12-25 DIAGNOSIS — Z9981 Dependence on supplemental oxygen: Secondary | ICD-10-CM | POA: Diagnosis not present

## 2021-12-25 DIAGNOSIS — D630 Anemia in neoplastic disease: Secondary | ICD-10-CM | POA: Diagnosis not present

## 2021-12-25 DIAGNOSIS — C7951 Secondary malignant neoplasm of bone: Secondary | ICD-10-CM | POA: Diagnosis not present

## 2021-12-25 DIAGNOSIS — I700 Atherosclerosis of aorta: Secondary | ICD-10-CM | POA: Diagnosis not present

## 2021-12-25 DIAGNOSIS — D500 Iron deficiency anemia secondary to blood loss (chronic): Secondary | ICD-10-CM | POA: Diagnosis not present

## 2021-12-25 DIAGNOSIS — J439 Emphysema, unspecified: Secondary | ICD-10-CM | POA: Diagnosis not present

## 2021-12-25 DIAGNOSIS — I2510 Atherosclerotic heart disease of native coronary artery without angina pectoris: Secondary | ICD-10-CM | POA: Diagnosis not present

## 2021-12-25 DIAGNOSIS — N1831 Chronic kidney disease, stage 3a: Secondary | ICD-10-CM | POA: Diagnosis not present

## 2021-12-25 DIAGNOSIS — M1990 Unspecified osteoarthritis, unspecified site: Secondary | ICD-10-CM | POA: Diagnosis not present

## 2021-12-25 DIAGNOSIS — M21372 Foot drop, left foot: Secondary | ICD-10-CM | POA: Diagnosis not present

## 2021-12-25 DIAGNOSIS — D631 Anemia in chronic kidney disease: Secondary | ICD-10-CM | POA: Diagnosis not present

## 2021-12-25 DIAGNOSIS — Z955 Presence of coronary angioplasty implant and graft: Secondary | ICD-10-CM | POA: Diagnosis not present

## 2021-12-25 DIAGNOSIS — K219 Gastro-esophageal reflux disease without esophagitis: Secondary | ICD-10-CM | POA: Diagnosis not present

## 2021-12-26 ENCOUNTER — Other Ambulatory Visit: Payer: Self-pay

## 2021-12-26 DIAGNOSIS — I6340 Cerebral infarction due to embolism of unspecified cerebral artery: Secondary | ICD-10-CM | POA: Diagnosis not present

## 2021-12-26 DIAGNOSIS — C3490 Malignant neoplasm of unspecified part of unspecified bronchus or lung: Secondary | ICD-10-CM | POA: Diagnosis not present

## 2021-12-27 ENCOUNTER — Ambulatory Visit: Payer: Medicare Other | Admitting: Internal Medicine

## 2021-12-27 ENCOUNTER — Encounter: Payer: Self-pay | Admitting: Family Medicine

## 2021-12-27 ENCOUNTER — Other Ambulatory Visit: Payer: Medicare Other

## 2021-12-27 ENCOUNTER — Other Ambulatory Visit (HOSPITAL_COMMUNITY): Payer: Self-pay

## 2021-12-29 ENCOUNTER — Other Ambulatory Visit: Payer: Self-pay

## 2021-12-31 ENCOUNTER — Ambulatory Visit: Payer: Medicare Other | Admitting: Internal Medicine

## 2021-12-31 ENCOUNTER — Other Ambulatory Visit: Payer: Medicare Other

## 2022-01-01 ENCOUNTER — Encounter: Payer: Self-pay | Admitting: Internal Medicine

## 2022-01-01 ENCOUNTER — Ambulatory Visit
Admission: RE | Admit: 2022-01-01 | Discharge: 2022-01-01 | Disposition: A | Discharge status: Home / Self Care | Payer: Medicare Other | Source: Ambulatory Visit | Attending: Interventional Radiology | Admitting: Interventional Radiology

## 2022-01-01 ENCOUNTER — Other Ambulatory Visit: Payer: Self-pay

## 2022-01-01 ENCOUNTER — Other Ambulatory Visit (HOSPITAL_COMMUNITY): Payer: Self-pay

## 2022-01-01 ENCOUNTER — Inpatient Hospital Stay (HOSPITAL_BASED_OUTPATIENT_CLINIC_OR_DEPARTMENT_OTHER): Payer: Medicare Other | Admitting: Internal Medicine

## 2022-01-01 ENCOUNTER — Inpatient Hospital Stay: Payer: Medicare Other | Attending: Physician Assistant

## 2022-01-01 VITALS — BP 132/46 | HR 74 | Temp 98.5°F | Resp 15 | Ht 71.0 in | Wt 162.4 lb

## 2022-01-01 DIAGNOSIS — D589 Hereditary hemolytic anemia, unspecified: Secondary | ICD-10-CM | POA: Diagnosis not present

## 2022-01-01 DIAGNOSIS — Z7901 Long term (current) use of anticoagulants: Secondary | ICD-10-CM | POA: Diagnosis not present

## 2022-01-01 DIAGNOSIS — C3491 Malignant neoplasm of unspecified part of right bronchus or lung: Secondary | ICD-10-CM

## 2022-01-01 DIAGNOSIS — C3490 Malignant neoplasm of unspecified part of unspecified bronchus or lung: Secondary | ICD-10-CM

## 2022-01-01 DIAGNOSIS — Z86711 Personal history of pulmonary embolism: Secondary | ICD-10-CM | POA: Diagnosis not present

## 2022-01-01 DIAGNOSIS — Z79899 Other long term (current) drug therapy: Secondary | ICD-10-CM | POA: Diagnosis not present

## 2022-01-01 DIAGNOSIS — R600 Localized edema: Secondary | ICD-10-CM | POA: Diagnosis not present

## 2022-01-01 DIAGNOSIS — D509 Iron deficiency anemia, unspecified: Secondary | ICD-10-CM | POA: Insufficient documentation

## 2022-01-01 DIAGNOSIS — Z86718 Personal history of other venous thrombosis and embolism: Secondary | ICD-10-CM | POA: Insufficient documentation

## 2022-01-01 LAB — CMP (CANCER CENTER ONLY)
ALT: 19 U/L (ref 0–44)
AST: 35 U/L (ref 15–41)
Albumin: 3.1 g/dL — ABNORMAL LOW (ref 3.5–5.0)
Alkaline Phosphatase: 81 U/L (ref 38–126)
Anion gap: 7 (ref 5–15)
BUN: 15 mg/dL (ref 8–23)
CO2: 28 mmol/L (ref 22–32)
Calcium: 8.7 mg/dL — ABNORMAL LOW (ref 8.9–10.3)
Chloride: 107 mmol/L (ref 98–111)
Creatinine: 1.21 mg/dL (ref 0.61–1.24)
GFR, Estimated: >60 mL/min (ref >60)
Glucose, Bld: 126 mg/dL — ABNORMAL HIGH (ref 70–99)
Potassium: 3.8 mmol/L (ref 3.5–5.1)
Sodium: 142 mmol/L (ref 135–145)
Total Bilirubin: 1.4 mg/dL — ABNORMAL HIGH (ref 0.3–1.2)
Total Protein: 6.2 g/dL — ABNORMAL LOW (ref 6.5–8.1)

## 2022-01-01 LAB — CBC WITH DIFFERENTIAL (CANCER CENTER ONLY)
Abs Immature Granulocytes: 0.12 10*3/uL — ABNORMAL HIGH (ref 0.00–0.07)
Basophils Absolute: 0.1 10*3/uL (ref 0.0–0.1)
Basophils Relative: 1 %
Eosinophils Absolute: 0.3 10*3/uL (ref 0.0–0.5)
Eosinophils Relative: 3 %
HCT: 26 % — ABNORMAL LOW (ref 39.0–52.0)
Hemoglobin: 8.6 g/dL — ABNORMAL LOW (ref 13.0–17.0)
Immature Granulocytes: 1 %
Lymphocytes Relative: 23 %
Lymphs Abs: 2.3 10*3/uL (ref 0.7–4.0)
MCH: 28.4 pg (ref 26.0–34.0)
MCHC: 33.1 g/dL (ref 30.0–36.0)
MCV: 85.8 fL (ref 80.0–100.0)
Monocytes Absolute: 1 10*3/uL (ref 0.1–1.0)
Monocytes Relative: 10 %
Neutro Abs: 6.2 10*3/uL (ref 1.7–7.7)
Neutrophils Relative %: 62 %
Platelet Count: 352 10*3/uL (ref 150–400)
RBC: 3.03 MIL/uL — ABNORMAL LOW (ref 4.22–5.81)
RDW: 14.6 % (ref 11.5–15.5)
WBC Count: 10 10*3/uL (ref 4.0–10.5)
nRBC: 0 % (ref 0.0–0.2)

## 2022-01-01 LAB — IRON AND IRON BINDING CAPACITY (CC-WL,HP ONLY)
Iron: 77 ug/dL (ref 45–182)
Saturation Ratios: 33 % (ref 17.9–39.5)
TIBC: 232 ug/dL — ABNORMAL LOW (ref 250–450)
UIBC: 155 ug/dL (ref 117–376)

## 2022-01-01 LAB — LACTATE DEHYDROGENASE: LDH: 258 U/L — ABNORMAL HIGH (ref 98–192)

## 2022-01-01 NOTE — Progress Notes (Signed)
[place] [phone]   [phone]  OFFICE PROGRESS NOTE  Lind Covert, MD [place] [address]  DIAGNOSIS:  1) Stage IV (T1b, N3, M1 C) non-small cell lung cancer, adenocarcinoma with positive ALK gene translocation diagnosed in February 2023. 2) deep venous thrombosis of the left lower extremity  PRIOR THERAPY: None  CURRENT THERAPY:  1) Alecensa (Alectinib) 600 mg p.o. twice daily.  First dose April 11, 2021.  Status post 7 months of treatment. 2) Eliquis 2.5 mg p.o. twice daily.  First dose 10/11/2021.    INTERVAL HISTORY: Arthur [name] 81 y.o. male returns to the clinic today for follow-up visit accompanied by his son and daughter.  The patient is feeling fine today with no concerning complaints except for fatigue.  He also has occasional night sweats.  He has lack of taste.  He denied having any current chest pain, shortness of breath, cough or hemoptysis.  He has no nausea, vomiting, diarrhea or constipation.  He has no headache or visual changes.  He is scheduled for Doppler of the lower extremity later today and expected to have the IVC filter removed next week.  He continues to tolerate his treatment with Alecensa (Alectinib) fairly well.  He is here today for evaluation and repeat blood work.  MEDICAL HISTORY: Past Medical History:  Diagnosis Date   Diabetes mellitus without complication ([place])    High cholesterol    Hypertension    lung ca    Normal nuclear stress test 01/29/2008   stress perfusion study apparently in 2010 in [facility] which he said was negative.    ALLERGIES:  is allergic to ace inhibitors and iohexol.  MEDICATIONS:  Current Outpatient Medications  Medication Sig Dispense Refill   alectinib (ALECENSA) 150 MG capsule Take 4 capsules (600 mg total) by mouth 2 (two) times daily with a meal. 240 capsule 3   apixaban (ELIQUIS) 2.5 MG TABS tablet Take 2.5 mg by mouth 2  (two) times daily.     clopidogrel (PLAVIX) 75 MG tablet Take 1 tablet (75 mg total) by mouth daily. 90 tablet 1   furosemide (LASIX) 20 MG tablet 1 tablet p.o. daily on as-needed basis for swelling of the lower extremities. 20 tablet 0   Iron-FA-B Cmp-C-Biot-Probiotic (FUSION PLUS) CAPS Take 1 capsule by mouth daily.     omeprazole (PRILOSEC) 20 MG capsule Take 1 capsule (20 mg total) by mouth daily as needed. 90 capsule 0   rosuvastatin (CRESTOR) 5 MG tablet Take 2 tablets (10 mg total) by mouth daily. 90 tablet 0   No current facility-administered medications for this visit.    SURGICAL HISTORY:  Past Surgical History:  Procedure Laterality Date   CATARACT EXTRACTION, BILATERAL  2006   CORONARY STENT INTERVENTION N/A 02/06/2021   Procedure: CORONARY STENT INTERVENTION;  Surgeon: Jettie Booze, MD;  Location: [place] [facility];  Service: Cardiovascular;  Laterality: N/A;   FINE NEEDLE ASPIRATION  03/21/2021   Procedure: FINE NEEDLE ASPIRATION;  Surgeon: Garner Nash, DO;  Location: [facility];  Service: Pulmonary;;   INTRAVASCULAR ULTRASOUND/IVUS N/A 02/06/2021   Procedure: Intravascular Ultrasound/IVUS;  Surgeon: Jettie Booze, MD;  Location: [place] [facility];  Service: Cardiovascular;  Laterality: N/A;   IR RADIOLOGIST EVAL & MGMT  12/24/2021   KNEE SURGERY  2005   LEFT HEART CATH AND CORONARY ANGIOGRAPHY N/A 02/06/2021   Procedure: LEFT HEART CATH AND CORONARY ANGIOGRAPHY;  Surgeon: Jettie Booze, MD;  Location: [facility];  Service: Cardiovascular;  Laterality: N/A;   VIDEO BRONCHOSCOPY WITH ENDOBRONCHIAL ULTRASOUND Bilateral 03/21/2021   Procedure: VIDEO BRONCHOSCOPY WITH ENDOBRONCHIAL ULTRASOUND;  Surgeon: Garner Nash, DO;  Location: [facility];  Service: Pulmonary;  Laterality: Bilateral;    REVIEW OF SYSTEMS:  A comprehensive review of systems was negative except for: Constitutional: positive for fatigue and sweats Musculoskeletal:  positive for arthralgias   PHYSICAL EXAMINATION: General appearance: alert, cooperative, fatigued, and no distress Head: Normocephalic, without obvious abnormality, atraumatic Neck: no adenopathy, no JVD, supple, symmetrical, trachea midline, and thyroid not enlarged, symmetric, no tenderness/mass/nodules Lymph nodes: Cervical, supraclavicular, and axillary nodes normal. Resp: clear to auscultation bilaterally Back: symmetric, no curvature. ROM normal. No CVA tenderness. Cardio: regular rate and rhythm, S1, S2 normal, no murmur, click, rub or gallop GI: soft, non-tender; bowel sounds normal; no masses,  no organomegaly Extremities: edema trace edema in the left lower extremity  ECOG PERFORMANCE STATUS: 1 - Symptomatic but completely ambulatory  Blood pressure (!) 132/46, pulse 74, temperature 98.5 F (36.9 C), resp. rate 15, height _0  (1.803 m), weight 162 lb 6.4 oz (73.7 kg), SpO2 99 %.  LABORATORY DATA: Lab Results  Component Value Date   WBC 10.0 01/01/2022   HGB 8.6 (L) 01/01/2022   HCT 26.0 (L) 01/01/2022   MCV 85.8 01/01/2022   PLT 352 01/01/2022      Chemistry      Component Value Date/Time   NA 141 11/27/2021 0836   NA 140 07/17/2021 1103   K 3.5 11/27/2021 0836   CL 100 11/27/2021 0836   CO2 36 (H) 11/27/2021 0836   BUN 24 (H) 11/27/2021 0836   BUN 21 10/09/2021 1147   CREATININE 1.53 (H) 11/27/2021 0836   CREATININE 1.18 11/15/2015 1114      Component Value Date/Time   CALCIUM 8.6 (L) 11/27/2021 0836   ALKPHOS 66 11/27/2021 0836   AST 37 11/27/2021 0836   ALT 32 11/27/2021 0836   BILITOT 2.9 (H) 11/27/2021 0836       RADIOGRAPHIC STUDIES: IR Radiologist Eval & Mgmt  Result Date: 12/24/2021 EXAM: NEW PATIENT OFFICE VISIT CHIEF COMPLAINT: See Epic note. HISTORY OF PRESENT ILLNESS: See Epic note. REVIEW OF SYSTEMS: See Epic note. PHYSICAL EXAMINATION: See Epic note. ASSESSMENT AND PLAN: See Epic note. Ruthann Cancer, MD [facility] Electronically Signed   By: Ruthann Cancer M.D.   On: 12/24/2021 12:58    ASSESSMENT AND PLAN: This is a very pleasant 81 years old [place] male diagnosed with Stage IV (T1b, N3, M1 C) non-small cell lung cancer, adenocarcinoma with positive ALK gene translocation in February 2023. The patient is currently undergoing treatment with Alecensa (Alectinib) 600 mg p.o. twice daily.  He has been tolerating the treatment well with no concerning complaints.  He is status post 9 months of treatment.   He has been tolerating his treatment fairly well with no concerning adverse effects. I recommended for him to continue his current treatment with Alecensa (Alectinib) with the same dose. I will see him back for follow-up visit in 5 weeks for evaluation and repeat CT scan of the chest, abdomen and pelvis for restaging of his disease. For the history of hemolytic anemia/iron deficiency, he will continue with the oral iron tablet with fusion plus.  He is also on a tapered dose of prednisone for the hemolysis which likely to be secondary to IVC filter  and mechanical distraction. For the history of deep venous thrombosis and pulmonary embolism, he is currently have IVC filter placed and on treatment with Plavix.  He resumed his treatment with Eliquis several weeks ago and tolerated it fairly well.  He is expected to have Doppler of the lower extremity to rule out the presence of acute DVT before proceeding with the removal of the IVC filter next week. The patient was advised to call immediately if he has any concerning symptoms in the interval. All questions were answered. The patient knows to call the clinic with any problems, questions or concerns. We can certainly see the patient much sooner if necessary.  The total time spent in the appointment was 20 minutes.  Disclaimer: This note was dictated with voice recognition software. Similar sounding words can inadvertently be  transcribed and may not be corrected upon review.

## 2022-01-02 ENCOUNTER — Other Ambulatory Visit: Payer: Self-pay

## 2022-01-02 DIAGNOSIS — R911 Solitary pulmonary nodule: Secondary | ICD-10-CM | POA: Diagnosis not present

## 2022-01-02 DIAGNOSIS — M1030 Gout due to renal impairment, unspecified site: Secondary | ICD-10-CM | POA: Diagnosis not present

## 2022-01-02 DIAGNOSIS — I509 Heart failure, unspecified: Secondary | ICD-10-CM | POA: Diagnosis not present

## 2022-01-02 DIAGNOSIS — C3491 Malignant neoplasm of unspecified part of right bronchus or lung: Secondary | ICD-10-CM | POA: Diagnosis not present

## 2022-01-02 DIAGNOSIS — I252 Old myocardial infarction: Secondary | ICD-10-CM | POA: Diagnosis not present

## 2022-01-02 DIAGNOSIS — M96A3 Multiple fractures of ribs associated with chest compression and cardiopulmonary resuscitation: Secondary | ICD-10-CM | POA: Diagnosis not present

## 2022-01-02 DIAGNOSIS — I130 Hypertensive heart and chronic kidney disease with heart failure and stage 1 through stage 4 chronic kidney disease, or unspecified chronic kidney disease: Secondary | ICD-10-CM | POA: Diagnosis not present

## 2022-01-02 DIAGNOSIS — D630 Anemia in neoplastic disease: Secondary | ICD-10-CM | POA: Diagnosis not present

## 2022-01-02 DIAGNOSIS — K219 Gastro-esophageal reflux disease without esophagitis: Secondary | ICD-10-CM | POA: Diagnosis not present

## 2022-01-02 DIAGNOSIS — C7951 Secondary malignant neoplasm of bone: Secondary | ICD-10-CM | POA: Diagnosis not present

## 2022-01-02 DIAGNOSIS — M21372 Foot drop, left foot: Secondary | ICD-10-CM | POA: Diagnosis not present

## 2022-01-02 DIAGNOSIS — I2510 Atherosclerotic heart disease of native coronary artery without angina pectoris: Secondary | ICD-10-CM | POA: Diagnosis not present

## 2022-01-02 DIAGNOSIS — E1122 Type 2 diabetes mellitus with diabetic chronic kidney disease: Secondary | ICD-10-CM | POA: Diagnosis not present

## 2022-01-02 DIAGNOSIS — M1990 Unspecified osteoarthritis, unspecified site: Secondary | ICD-10-CM | POA: Diagnosis not present

## 2022-01-02 DIAGNOSIS — I69354 Hemiplegia and hemiparesis following cerebral infarction affecting left non-dominant side: Secondary | ICD-10-CM | POA: Diagnosis not present

## 2022-01-02 DIAGNOSIS — I700 Atherosclerosis of aorta: Secondary | ICD-10-CM | POA: Diagnosis not present

## 2022-01-02 DIAGNOSIS — E1142 Type 2 diabetes mellitus with diabetic polyneuropathy: Secondary | ICD-10-CM | POA: Diagnosis not present

## 2022-01-02 DIAGNOSIS — E785 Hyperlipidemia, unspecified: Secondary | ICD-10-CM | POA: Diagnosis not present

## 2022-01-02 DIAGNOSIS — J439 Emphysema, unspecified: Secondary | ICD-10-CM | POA: Diagnosis not present

## 2022-01-02 DIAGNOSIS — D631 Anemia in chronic kidney disease: Secondary | ICD-10-CM | POA: Diagnosis not present

## 2022-01-02 DIAGNOSIS — D500 Iron deficiency anemia secondary to blood loss (chronic): Secondary | ICD-10-CM | POA: Diagnosis not present

## 2022-01-02 DIAGNOSIS — N1831 Chronic kidney disease, stage 3a: Secondary | ICD-10-CM | POA: Diagnosis not present

## 2022-01-02 DIAGNOSIS — Z9981 Dependence on supplemental oxygen: Secondary | ICD-10-CM | POA: Diagnosis not present

## 2022-01-02 DIAGNOSIS — Z7902 Long term (current) use of antithrombotics/antiplatelets: Secondary | ICD-10-CM | POA: Diagnosis not present

## 2022-01-02 DIAGNOSIS — Z955 Presence of coronary angioplasty implant and graft: Secondary | ICD-10-CM | POA: Diagnosis not present

## 2022-01-03 ENCOUNTER — Other Ambulatory Visit: Payer: Self-pay | Admitting: Radiology

## 2022-01-03 ENCOUNTER — Other Ambulatory Visit (HOSPITAL_COMMUNITY): Payer: Self-pay

## 2022-01-03 DIAGNOSIS — I82432 Acute embolism and thrombosis of left popliteal vein: Secondary | ICD-10-CM

## 2022-01-04 ENCOUNTER — Other Ambulatory Visit: Payer: Self-pay | Admitting: Student

## 2022-01-04 ENCOUNTER — Other Ambulatory Visit: Payer: Self-pay | Admitting: Radiology

## 2022-01-07 ENCOUNTER — Other Ambulatory Visit: Payer: Self-pay

## 2022-01-07 ENCOUNTER — Ambulatory Visit (HOSPITAL_COMMUNITY)
Admission: RE | Admit: 2022-01-07 | Discharge: 2022-01-07 | Disposition: A | Discharge status: Home / Self Care | Payer: Medicare Other | Source: Ambulatory Visit | Attending: Internal Medicine | Admitting: Internal Medicine

## 2022-01-07 DIAGNOSIS — C7951 Secondary malignant neoplasm of bone: Secondary | ICD-10-CM | POA: Diagnosis not present

## 2022-01-07 DIAGNOSIS — I2510 Atherosclerotic heart disease of native coronary artery without angina pectoris: Secondary | ICD-10-CM | POA: Diagnosis not present

## 2022-01-07 DIAGNOSIS — J439 Emphysema, unspecified: Secondary | ICD-10-CM | POA: Diagnosis not present

## 2022-01-07 DIAGNOSIS — Z9981 Dependence on supplemental oxygen: Secondary | ICD-10-CM | POA: Diagnosis not present

## 2022-01-07 DIAGNOSIS — D500 Iron deficiency anemia secondary to blood loss (chronic): Secondary | ICD-10-CM | POA: Diagnosis not present

## 2022-01-07 DIAGNOSIS — R911 Solitary pulmonary nodule: Secondary | ICD-10-CM | POA: Diagnosis not present

## 2022-01-07 DIAGNOSIS — I252 Old myocardial infarction: Secondary | ICD-10-CM | POA: Diagnosis not present

## 2022-01-07 DIAGNOSIS — I700 Atherosclerosis of aorta: Secondary | ICD-10-CM | POA: Diagnosis not present

## 2022-01-07 DIAGNOSIS — Z955 Presence of coronary angioplasty implant and graft: Secondary | ICD-10-CM | POA: Diagnosis not present

## 2022-01-07 DIAGNOSIS — E1142 Type 2 diabetes mellitus with diabetic polyneuropathy: Secondary | ICD-10-CM | POA: Diagnosis not present

## 2022-01-07 DIAGNOSIS — D631 Anemia in chronic kidney disease: Secondary | ICD-10-CM | POA: Diagnosis not present

## 2022-01-07 DIAGNOSIS — C3491 Malignant neoplasm of unspecified part of right bronchus or lung: Secondary | ICD-10-CM | POA: Diagnosis not present

## 2022-01-07 DIAGNOSIS — E1122 Type 2 diabetes mellitus with diabetic chronic kidney disease: Secondary | ICD-10-CM | POA: Diagnosis not present

## 2022-01-07 DIAGNOSIS — M1990 Unspecified osteoarthritis, unspecified site: Secondary | ICD-10-CM | POA: Diagnosis not present

## 2022-01-07 DIAGNOSIS — K219 Gastro-esophageal reflux disease without esophagitis: Secondary | ICD-10-CM | POA: Diagnosis not present

## 2022-01-07 DIAGNOSIS — N1831 Chronic kidney disease, stage 3a: Secondary | ICD-10-CM | POA: Diagnosis not present

## 2022-01-07 DIAGNOSIS — I509 Heart failure, unspecified: Secondary | ICD-10-CM | POA: Diagnosis not present

## 2022-01-07 DIAGNOSIS — Z7902 Long term (current) use of antithrombotics/antiplatelets: Secondary | ICD-10-CM | POA: Diagnosis not present

## 2022-01-07 DIAGNOSIS — M21372 Foot drop, left foot: Secondary | ICD-10-CM | POA: Diagnosis not present

## 2022-01-07 DIAGNOSIS — I69354 Hemiplegia and hemiparesis following cerebral infarction affecting left non-dominant side: Secondary | ICD-10-CM | POA: Diagnosis not present

## 2022-01-07 DIAGNOSIS — I130 Hypertensive heart and chronic kidney disease with heart failure and stage 1 through stage 4 chronic kidney disease, or unspecified chronic kidney disease: Secondary | ICD-10-CM | POA: Diagnosis not present

## 2022-01-07 DIAGNOSIS — D630 Anemia in neoplastic disease: Secondary | ICD-10-CM | POA: Diagnosis not present

## 2022-01-07 DIAGNOSIS — M1030 Gout due to renal impairment, unspecified site: Secondary | ICD-10-CM | POA: Diagnosis not present

## 2022-01-07 DIAGNOSIS — M96A3 Multiple fractures of ribs associated with chest compression and cardiopulmonary resuscitation: Secondary | ICD-10-CM | POA: Diagnosis not present

## 2022-01-07 DIAGNOSIS — E785 Hyperlipidemia, unspecified: Secondary | ICD-10-CM | POA: Diagnosis not present

## 2022-01-08 ENCOUNTER — Other Ambulatory Visit: Payer: Self-pay | Admitting: Internal Medicine

## 2022-01-09 ENCOUNTER — Telehealth: Payer: Self-pay | Admitting: Pharmacy Technician

## 2022-01-09 NOTE — Telephone Encounter (Signed)
Oral Oncology Patient Advocate Encounter  Prior Authorization for Eyvonne Left has been approved.    PA#  [id number] Effective dates: 01/09/22 through 01/28/23  Patient may continue to fill as usual.    Lady Deutscher, CPhT-Adv Oncology Pharmacy Patient [place] Direct Number: [phone]  Fax: [phone]

## 2022-01-09 NOTE — Telephone Encounter (Signed)
Oral Oncology Patient Advocate Encounter   Received notification that prior authorization for Alecnesa is due for renewal.   PA submitted on 01/09/22 Key [id number] Status is pending     Lady Deutscher, CPhT-Adv Oncology Pharmacy Patient [place] Direct Number: [phone]  Fax: [phone]

## 2022-01-10 DIAGNOSIS — S7010XA Contusion of unspecified thigh, initial encounter: Secondary | ICD-10-CM | POA: Diagnosis not present

## 2022-01-10 DIAGNOSIS — M79606 Pain in leg, unspecified: Secondary | ICD-10-CM | POA: Diagnosis not present

## 2022-01-14 DIAGNOSIS — Z9981 Dependence on supplemental oxygen: Secondary | ICD-10-CM | POA: Diagnosis not present

## 2022-01-14 DIAGNOSIS — J439 Emphysema, unspecified: Secondary | ICD-10-CM | POA: Diagnosis not present

## 2022-01-14 DIAGNOSIS — Z955 Presence of coronary angioplasty implant and graft: Secondary | ICD-10-CM | POA: Diagnosis not present

## 2022-01-14 DIAGNOSIS — I252 Old myocardial infarction: Secondary | ICD-10-CM | POA: Diagnosis not present

## 2022-01-14 DIAGNOSIS — E1142 Type 2 diabetes mellitus with diabetic polyneuropathy: Secondary | ICD-10-CM | POA: Diagnosis not present

## 2022-01-14 DIAGNOSIS — R911 Solitary pulmonary nodule: Secondary | ICD-10-CM | POA: Diagnosis not present

## 2022-01-14 DIAGNOSIS — I700 Atherosclerosis of aorta: Secondary | ICD-10-CM | POA: Diagnosis not present

## 2022-01-14 DIAGNOSIS — M21372 Foot drop, left foot: Secondary | ICD-10-CM | POA: Diagnosis not present

## 2022-01-14 DIAGNOSIS — M96A3 Multiple fractures of ribs associated with chest compression and cardiopulmonary resuscitation: Secondary | ICD-10-CM | POA: Diagnosis not present

## 2022-01-14 DIAGNOSIS — I2510 Atherosclerotic heart disease of native coronary artery without angina pectoris: Secondary | ICD-10-CM | POA: Diagnosis not present

## 2022-01-14 DIAGNOSIS — D630 Anemia in neoplastic disease: Secondary | ICD-10-CM | POA: Diagnosis not present

## 2022-01-14 DIAGNOSIS — K219 Gastro-esophageal reflux disease without esophagitis: Secondary | ICD-10-CM | POA: Diagnosis not present

## 2022-01-14 DIAGNOSIS — C3491 Malignant neoplasm of unspecified part of right bronchus or lung: Secondary | ICD-10-CM | POA: Diagnosis not present

## 2022-01-14 DIAGNOSIS — M1990 Unspecified osteoarthritis, unspecified site: Secondary | ICD-10-CM | POA: Diagnosis not present

## 2022-01-14 DIAGNOSIS — I130 Hypertensive heart and chronic kidney disease with heart failure and stage 1 through stage 4 chronic kidney disease, or unspecified chronic kidney disease: Secondary | ICD-10-CM | POA: Diagnosis not present

## 2022-01-14 DIAGNOSIS — C7951 Secondary malignant neoplasm of bone: Secondary | ICD-10-CM | POA: Diagnosis not present

## 2022-01-14 DIAGNOSIS — I509 Heart failure, unspecified: Secondary | ICD-10-CM | POA: Diagnosis not present

## 2022-01-14 DIAGNOSIS — Z7902 Long term (current) use of antithrombotics/antiplatelets: Secondary | ICD-10-CM | POA: Diagnosis not present

## 2022-01-14 DIAGNOSIS — E1122 Type 2 diabetes mellitus with diabetic chronic kidney disease: Secondary | ICD-10-CM | POA: Diagnosis not present

## 2022-01-14 DIAGNOSIS — M1030 Gout due to renal impairment, unspecified site: Secondary | ICD-10-CM | POA: Diagnosis not present

## 2022-01-14 DIAGNOSIS — D631 Anemia in chronic kidney disease: Secondary | ICD-10-CM | POA: Diagnosis not present

## 2022-01-14 DIAGNOSIS — N1831 Chronic kidney disease, stage 3a: Secondary | ICD-10-CM | POA: Diagnosis not present

## 2022-01-14 DIAGNOSIS — E785 Hyperlipidemia, unspecified: Secondary | ICD-10-CM | POA: Diagnosis not present

## 2022-01-14 DIAGNOSIS — I69354 Hemiplegia and hemiparesis following cerebral infarction affecting left non-dominant side: Secondary | ICD-10-CM | POA: Diagnosis not present

## 2022-01-14 DIAGNOSIS — D500 Iron deficiency anemia secondary to blood loss (chronic): Secondary | ICD-10-CM | POA: Diagnosis not present

## 2022-01-15 ENCOUNTER — Other Ambulatory Visit (HOSPITAL_COMMUNITY): Payer: Self-pay

## 2022-01-25 ENCOUNTER — Telehealth: Payer: Self-pay | Admitting: *Deleted

## 2022-01-25 DIAGNOSIS — I130 Hypertensive heart and chronic kidney disease with heart failure and stage 1 through stage 4 chronic kidney disease, or unspecified chronic kidney disease: Secondary | ICD-10-CM | POA: Diagnosis not present

## 2022-01-25 DIAGNOSIS — M21372 Foot drop, left foot: Secondary | ICD-10-CM | POA: Diagnosis not present

## 2022-01-25 DIAGNOSIS — M1030 Gout due to renal impairment, unspecified site: Secondary | ICD-10-CM | POA: Diagnosis not present

## 2022-01-25 DIAGNOSIS — C3491 Malignant neoplasm of unspecified part of right bronchus or lung: Secondary | ICD-10-CM | POA: Diagnosis not present

## 2022-01-25 DIAGNOSIS — N1831 Chronic kidney disease, stage 3a: Secondary | ICD-10-CM | POA: Diagnosis not present

## 2022-01-25 DIAGNOSIS — M1990 Unspecified osteoarthritis, unspecified site: Secondary | ICD-10-CM | POA: Diagnosis not present

## 2022-01-25 DIAGNOSIS — D631 Anemia in chronic kidney disease: Secondary | ICD-10-CM | POA: Diagnosis not present

## 2022-01-25 DIAGNOSIS — E1142 Type 2 diabetes mellitus with diabetic polyneuropathy: Secondary | ICD-10-CM | POA: Diagnosis not present

## 2022-01-25 DIAGNOSIS — I252 Old myocardial infarction: Secondary | ICD-10-CM | POA: Diagnosis not present

## 2022-01-25 DIAGNOSIS — K219 Gastro-esophageal reflux disease without esophagitis: Secondary | ICD-10-CM | POA: Diagnosis not present

## 2022-01-25 DIAGNOSIS — Z955 Presence of coronary angioplasty implant and graft: Secondary | ICD-10-CM | POA: Diagnosis not present

## 2022-01-25 DIAGNOSIS — J439 Emphysema, unspecified: Secondary | ICD-10-CM | POA: Diagnosis not present

## 2022-01-25 DIAGNOSIS — I2510 Atherosclerotic heart disease of native coronary artery without angina pectoris: Secondary | ICD-10-CM | POA: Diagnosis not present

## 2022-01-25 DIAGNOSIS — I509 Heart failure, unspecified: Secondary | ICD-10-CM | POA: Diagnosis not present

## 2022-01-25 DIAGNOSIS — I700 Atherosclerosis of aorta: Secondary | ICD-10-CM | POA: Diagnosis not present

## 2022-01-25 DIAGNOSIS — I6340 Cerebral infarction due to embolism of unspecified cerebral artery: Secondary | ICD-10-CM | POA: Diagnosis not present

## 2022-01-25 DIAGNOSIS — E1122 Type 2 diabetes mellitus with diabetic chronic kidney disease: Secondary | ICD-10-CM | POA: Diagnosis not present

## 2022-01-25 DIAGNOSIS — D630 Anemia in neoplastic disease: Secondary | ICD-10-CM | POA: Diagnosis not present

## 2022-01-25 DIAGNOSIS — Z9981 Dependence on supplemental oxygen: Secondary | ICD-10-CM | POA: Diagnosis not present

## 2022-01-25 DIAGNOSIS — D500 Iron deficiency anemia secondary to blood loss (chronic): Secondary | ICD-10-CM | POA: Diagnosis not present

## 2022-01-25 DIAGNOSIS — Z7902 Long term (current) use of antithrombotics/antiplatelets: Secondary | ICD-10-CM | POA: Diagnosis not present

## 2022-01-25 DIAGNOSIS — R911 Solitary pulmonary nodule: Secondary | ICD-10-CM | POA: Diagnosis not present

## 2022-01-25 DIAGNOSIS — M96A3 Multiple fractures of ribs associated with chest compression and cardiopulmonary resuscitation: Secondary | ICD-10-CM | POA: Diagnosis not present

## 2022-01-25 DIAGNOSIS — I69354 Hemiplegia and hemiparesis following cerebral infarction affecting left non-dominant side: Secondary | ICD-10-CM | POA: Diagnosis not present

## 2022-01-25 DIAGNOSIS — C7951 Secondary malignant neoplasm of bone: Secondary | ICD-10-CM | POA: Diagnosis not present

## 2022-01-25 DIAGNOSIS — E785 Hyperlipidemia, unspecified: Secondary | ICD-10-CM | POA: Diagnosis not present

## 2022-01-25 DIAGNOSIS — C3490 Malignant neoplasm of unspecified part of unspecified bronchus or lung: Secondary | ICD-10-CM | POA: Diagnosis not present

## 2022-01-25 NOTE — Telephone Encounter (Signed)
E-mail received from [email or web address] retrieved from [email or web address].  Requesting renewal of FMLA claim no.# [id number] for Arthur [name]'s daughter Arthur [name] [phone]). Connected with Shital to request FMLA form and a signed [place].  Confirmed information may be e-mailed.  Shital will connect with [facility] to determine if she must re-apply, receive form and claim number to send to [facility],  Currently no further questions or needs at this time.

## 2022-01-30 NOTE — Telephone Encounter (Signed)
[place] form [phone] PDF located online.  Completed by this nurse 01/25/2022 to collaborative bin for provider review signature and return received today.  Successfully returned via fax for claim ID number [id number].  Copy mailed to daughter Shital A. Manuella Ghazi c/o Armandina Gemma address on file. [address] [place] [address] Unable to use photos of form received today per [email or web address] email.  No new leave number received with this email.  Form to [employer] bin designated for items to be scanned completes process.  No further instructions received or actions required by this nurse.

## 2022-01-31 ENCOUNTER — Other Ambulatory Visit (HOSPITAL_COMMUNITY): Payer: Self-pay

## 2022-02-01 ENCOUNTER — Other Ambulatory Visit: Payer: Medicare Other

## 2022-02-05 ENCOUNTER — Ambulatory Visit: Payer: Medicare Other | Admitting: Internal Medicine

## 2022-02-06 ENCOUNTER — Other Ambulatory Visit: Payer: Self-pay

## 2022-02-08 ENCOUNTER — Other Ambulatory Visit: Payer: Self-pay | Admitting: Internal Medicine

## 2022-02-10 DIAGNOSIS — M79606 Pain in leg, unspecified: Secondary | ICD-10-CM | POA: Diagnosis not present

## 2022-02-10 DIAGNOSIS — S7010XA Contusion of unspecified thigh, initial encounter: Secondary | ICD-10-CM | POA: Diagnosis not present

## 2022-02-11 ENCOUNTER — Ambulatory Visit (HOSPITAL_COMMUNITY)
Admission: RE | Admit: 2022-02-11 | Discharge: 2022-02-11 | Disposition: A | Discharge status: Home / Self Care | Payer: 59 | Source: Ambulatory Visit | Attending: Internal Medicine | Admitting: Internal Medicine

## 2022-02-11 ENCOUNTER — Inpatient Hospital Stay: Payer: 59 | Attending: Physician Assistant

## 2022-02-11 ENCOUNTER — Other Ambulatory Visit: Payer: Self-pay

## 2022-02-11 DIAGNOSIS — J439 Emphysema, unspecified: Secondary | ICD-10-CM | POA: Diagnosis not present

## 2022-02-11 DIAGNOSIS — N200 Calculus of kidney: Secondary | ICD-10-CM | POA: Diagnosis not present

## 2022-02-11 DIAGNOSIS — C3490 Malignant neoplasm of unspecified part of unspecified bronchus or lung: Secondary | ICD-10-CM | POA: Diagnosis not present

## 2022-02-11 DIAGNOSIS — J90 Pleural effusion, not elsewhere classified: Secondary | ICD-10-CM | POA: Diagnosis not present

## 2022-02-11 DIAGNOSIS — K8020 Calculus of gallbladder without cholecystitis without obstruction: Secondary | ICD-10-CM | POA: Diagnosis not present

## 2022-02-11 LAB — CMP (CANCER CENTER ONLY)
ALT: 15 U/L (ref 0–44)
AST: 29 U/L (ref 15–41)
Albumin: 3.1 g/dL — ABNORMAL LOW (ref 3.5–5.0)
Alkaline Phosphatase: 94 U/L (ref 38–126)
Anion gap: 6 (ref 5–15)
BUN: 27 mg/dL — ABNORMAL HIGH (ref 8–23)
CO2: 27 mmol/L (ref 22–32)
Calcium: 8.7 mg/dL — ABNORMAL LOW (ref 8.9–10.3)
Chloride: 104 mmol/L (ref 98–111)
Creatinine: 1.46 mg/dL — ABNORMAL HIGH (ref 0.61–1.24)
GFR, Estimated: 48 mL/min — ABNORMAL LOW (ref >60)
Glucose, Bld: 144 mg/dL — ABNORMAL HIGH (ref 70–99)
Potassium: 4.2 mmol/L (ref 3.5–5.1)
Sodium: 137 mmol/L (ref 135–145)
Total Bilirubin: 1.4 mg/dL — ABNORMAL HIGH (ref 0.3–1.2)
Total Protein: 6.5 g/dL (ref 6.5–8.1)

## 2022-02-11 LAB — CBC WITH DIFFERENTIAL (CANCER CENTER ONLY)
Abs Immature Granulocytes: 0.07 10*3/uL (ref 0.00–0.07)
Basophils Absolute: 0.1 10*3/uL (ref 0.0–0.1)
Basophils Relative: 1 %
Eosinophils Absolute: 0.5 10*3/uL (ref 0.0–0.5)
Eosinophils Relative: 5 %
HCT: 26.8 % — ABNORMAL LOW (ref 39.0–52.0)
Hemoglobin: 8.9 g/dL — ABNORMAL LOW (ref 13.0–17.0)
Immature Granulocytes: 1 %
Lymphocytes Relative: 28 %
Lymphs Abs: 2.9 10*3/uL (ref 0.7–4.0)
MCH: 28.1 pg (ref 26.0–34.0)
MCHC: 33.2 g/dL (ref 30.0–36.0)
MCV: 84.5 fL (ref 80.0–100.0)
Monocytes Absolute: 0.9 10*3/uL (ref 0.1–1.0)
Monocytes Relative: 9 %
Neutro Abs: 6 10*3/uL (ref 1.7–7.7)
Neutrophils Relative %: 56 %
Platelet Count: 338 10*3/uL (ref 150–400)
RBC: 3.17 MIL/uL — ABNORMAL LOW (ref 4.22–5.81)
RDW: 16.1 % — ABNORMAL HIGH (ref 11.5–15.5)
WBC Count: 10.4 10*3/uL (ref 4.0–10.5)
nRBC: 0 % (ref 0.0–0.2)

## 2022-02-14 ENCOUNTER — Inpatient Hospital Stay (HOSPITAL_BASED_OUTPATIENT_CLINIC_OR_DEPARTMENT_OTHER): Payer: 59 | Admitting: Internal Medicine

## 2022-02-14 ENCOUNTER — Telehealth: Payer: Self-pay | Admitting: Medical Oncology

## 2022-02-14 ENCOUNTER — Other Ambulatory Visit: Payer: Self-pay

## 2022-02-14 VITALS — BP 133/40 | HR 71 | Temp 97.8°F | Resp 15 | Wt 158.6 lb

## 2022-02-14 DIAGNOSIS — Z86718 Personal history of other venous thrombosis and embolism: Secondary | ICD-10-CM | POA: Insufficient documentation

## 2022-02-14 DIAGNOSIS — R630 Anorexia: Secondary | ICD-10-CM | POA: Diagnosis not present

## 2022-02-14 DIAGNOSIS — C3490 Malignant neoplasm of unspecified part of unspecified bronchus or lung: Secondary | ICD-10-CM | POA: Diagnosis not present

## 2022-02-14 DIAGNOSIS — D649 Anemia, unspecified: Secondary | ICD-10-CM | POA: Insufficient documentation

## 2022-02-14 DIAGNOSIS — Z7901 Long term (current) use of anticoagulants: Secondary | ICD-10-CM | POA: Insufficient documentation

## 2022-02-14 DIAGNOSIS — K8020 Calculus of gallbladder without cholecystitis without obstruction: Secondary | ICD-10-CM | POA: Diagnosis not present

## 2022-02-14 DIAGNOSIS — C3491 Malignant neoplasm of unspecified part of right bronchus or lung: Secondary | ICD-10-CM | POA: Diagnosis not present

## 2022-02-14 DIAGNOSIS — Z79899 Other long term (current) drug therapy: Secondary | ICD-10-CM | POA: Insufficient documentation

## 2022-02-14 MED ORDER — METHYLPREDNISOLONE 4 MG PO TBPK: ORAL_TABLET | ORAL | 0 refills | Status: DC

## 2022-02-14 MED ORDER — APIXABAN 2.5 MG PO TABS: 2.5000 mg | ORAL_TABLET | Freq: Two times a day (BID) | ORAL | 0 refills | Status: DC

## 2022-02-14 NOTE — Progress Notes (Signed)
[facility] [phone]   [phone]  OFFICE PROGRESS NOTE  Carney Living, MD [address] [place] [address]  DIAGNOSIS:  1) Stage IV (T1b, N3, M1 C) non-small cell lung cancer, adenocarcinoma with positive ALK gene translocation diagnosed in February 2023. 2) deep venous thrombosis of the left lower extremity  PRIOR THERAPY: None  CURRENT THERAPY:  1) Alecensa (Alectinib) 600 mg p.o. twice daily.  First dose April 11, 2021.  Status post 10 months of treatment. 2) Eliquis 2.5 mg p.o. twice daily.  First dose 10/11/2021.    INTERVAL HISTORY: Arthur [name] 82 y.o. male returns to the clinic today for follow-up visit accompanied by his son.  The patient is feeling fine today with no concerning complaints except for the fatigue.  He denied having any shortness of breath, chest pain, cough or hemoptysis.  He has no nausea, vomiting, diarrhea or constipation.  He has no itching.  He continues to tolerate his treatment with Alecensa (Alectinib) fairly well.  He had repeat CT scan of the chest, abdomen and pelvis performed recently and he is here for evaluation and discussion of his scan results.  MEDICAL HISTORY: Past Medical History:  Diagnosis Date   Diabetes mellitus without complication (HCC)    High cholesterol    Hypertension    lung ca    Normal nuclear stress test 01/29/2008   stress perfusion study apparently in 2010 in [facility] which he said was negative.    ALLERGIES:  is allergic to ace inhibitors and iohexol.  MEDICATIONS:  Current Outpatient Medications  Medication Sig Dispense Refill   alectinib (ALECENSA) 150 MG capsule Take 4 capsules (600 mg total) by mouth 2 (two) times daily with a meal. 240 capsule 3   clopidogrel (PLAVIX) 75 MG tablet Take 1 tablet (75 mg total) by mouth daily. 90 tablet 1   ELIQUIS 2.5 MG TABS tablet Take 1 tablet by mouth twice daily 180 tablet 0   furosemide (LASIX)  20 MG tablet 1 tablet p.o. daily on as-needed basis for swelling of the lower extremities. (Patient taking differently: Take 20 mg by mouth daily as needed for edema.) 20 tablet 0   Iron-FA-B Cmp-C-Biot-Probiotic (FUSION PLUS) CAPS Take 1 capsule by mouth daily.     omeprazole (PRILOSEC) 20 MG capsule Take 1 capsule (20 mg total) by mouth daily as needed. (Patient taking differently: Take 20 mg by mouth daily as needed (acid reflux).) 90 capsule 0   polyethylene glycol (MIRALAX / GLYCOLAX) 17 g packet Take 17 g by mouth daily as needed for moderate constipation.     rosuvastatin (CRESTOR) 5 MG tablet Take 5 mg by mouth daily. 90 tablet 0   No current facility-administered medications for this visit.    SURGICAL HISTORY:  Past Surgical History:  Procedure Laterality Date   CATARACT EXTRACTION, BILATERAL  2006   CORONARY STENT INTERVENTION N/A 02/06/2021   Procedure: CORONARY STENT INTERVENTION;  Surgeon: Corky Crafts, MD;  Location: [facility] INVASIVE CV LAB;  Service: Cardiovascular;  Laterality: N/A;   FINE NEEDLE ASPIRATION  03/21/2021   Procedure: FINE NEEDLE ASPIRATION;  Surgeon: Josephine Igo, DO;  Location: [facility] ENDOSCOPY;  Service: Pulmonary;;   INTRAVASCULAR ULTRASOUND/IVUS N/A 02/06/2021   Procedure: Intravascular Ultrasound/IVUS;  Surgeon: Corky Crafts, MD;  Location: [facility] INVASIVE CV LAB;  Service: Cardiovascular;  Laterality: N/A;   IR RADIOLOGIST EVAL & MGMT  12/24/2021   KNEE SURGERY  2005  LEFT HEART CATH AND CORONARY ANGIOGRAPHY N/A 02/06/2021   Procedure: LEFT HEART CATH AND CORONARY ANGIOGRAPHY;  Surgeon: Corky Crafts, MD;  Location: [facility] INVASIVE CV LAB;  Service: Cardiovascular;  Laterality: N/A;   VIDEO BRONCHOSCOPY WITH ENDOBRONCHIAL ULTRASOUND Bilateral 03/21/2021   Procedure: VIDEO BRONCHOSCOPY WITH ENDOBRONCHIAL ULTRASOUND;  Surgeon: Josephine Igo, DO;  Location: [facility] ENDOSCOPY;  Service: Pulmonary;  Laterality: Bilateral;    REVIEW OF SYSTEMS:   Constitutional: positive for fatigue Eyes: negative Ears, nose, mouth, throat, and face: negative Respiratory: negative Cardiovascular: negative Gastrointestinal: negative Genitourinary:negative Integument/breast: negative Hematologic/lymphatic: negative Musculoskeletal:positive for arthralgias and muscle weakness Neurological: negative Behavioral/Psych: negative Endocrine: negative Allergic/Immunologic: negative   PHYSICAL EXAMINATION: General appearance: alert, cooperative, fatigued, and no distress Head: Normocephalic, without obvious abnormality, atraumatic Neck: no adenopathy, no JVD, supple, symmetrical, trachea midline, and thyroid not enlarged, symmetric, no tenderness/mass/nodules Lymph nodes: Cervical, supraclavicular, and axillary nodes normal. Resp: clear to auscultation bilaterally Back: symmetric, no curvature. ROM normal. No CVA tenderness. Cardio: regular rate and rhythm, S1, S2 normal, no murmur, click, rub or gallop GI: soft, non-tender; bowel sounds normal; no masses,  no organomegaly Extremities: edema trace edema in the left lower extremity Neurologic: Alert and oriented X 3, normal strength and tone. Normal symmetric reflexes. Normal coordination and gait  ECOG PERFORMANCE STATUS: 1 - Symptomatic but completely ambulatory  Blood pressure (!) 133/40, pulse 71, temperature 97.8 F (36.6 C), temperature source Oral, resp. rate 15, weight 158 lb 9.6 oz (71.9 kg), SpO2 97 %.  LABORATORY DATA: Lab Results  Component Value Date   WBC 10.4 02/11/2022   HGB 8.9 (L) 02/11/2022   HCT 26.8 (L) 02/11/2022   MCV 84.5 02/11/2022   PLT 338 02/11/2022      Chemistry      Component Value Date/Time   NA 137 02/11/2022 0948   NA 140 07/17/2021 1103   K 4.2 02/11/2022 0948   CL 104 02/11/2022 0948   CO2 27 02/11/2022 0948   BUN 27 (H) 02/11/2022 0948   BUN 21 10/09/2021 1147   CREATININE 1.46 (H) 02/11/2022 0948   CREATININE 1.18 11/15/2015 1114      Component  Value Date/Time   CALCIUM 8.7 (L) 02/11/2022 0948   ALKPHOS 94 02/11/2022 0948   AST 29 02/11/2022 0948   ALT 15 02/11/2022 0948   BILITOT 1.4 (H) 02/11/2022 0948       RADIOGRAPHIC STUDIES: CT Chest Wo Contrast  Result Date: 02/11/2022 CLINICAL DATA:  Non-small-cell lung cancer.  Restaging. EXAM: CT CHEST, ABDOMEN AND PELVIS WITHOUT CONTRAST TECHNIQUE: Multidetector CT imaging of the chest, abdomen and pelvis was performed following the standard protocol without IV contrast. RADIATION DOSE REDUCTION: This exam was performed according to the departmental dose-optimization program which includes automated exposure control, adjustment of the mA and/or kV according to patient size and/or use of iterative reconstruction technique. COMPARISON:  11/06/2021 FINDINGS: CT CHEST FINDINGS Cardiovascular: The heart size is normal. No substantial pericardial effusion. Coronary artery calcification is evident. Mild atherosclerotic calcification is noted in the wall of the thoracic aorta. Mediastinum/Nodes: No mediastinal lymphadenopathy. No evidence for gross hilar lymphadenopathy although assessment is limited by the lack of intravenous contrast on the current study. The esophagus has normal imaging features. Small lymph nodes in the left axilla are stable. Lungs/Pleura: Centrilobular and paraseptal emphysema evident. 13 mm focus of ground-glass opacity in the posterior right middle lobe (image [phone]) is likely infectious/inflammatory. 9 x 6 mm right lower lobe pulmonary nodule measured previously is 7  x 4 mm today on image 71/506. No new suspicious pulmonary nodule or mass. No focal airspace consolidation. 2.5 x 1.6 cm nodular paraspinal fluid density structure (image [id number]) is slightly increased in the interval since the most recent comparison study but smaller than on the 08/02/2021 exam. Given that this measures water density and may represent loculated fluid. Musculoskeletal: No worrisome lytic or sclerotic  osseous abnormality. CT ABDOMEN PELVIS FINDINGS Hepatobiliary: No suspicious focal abnormality in the liver on this study without intravenous contrast. Tiny calcified gallstones evident. No intrahepatic or extrahepatic biliary dilation. Pancreas: Diffuse dilatation of the main pancreatic duct again noted. 19 mm stone identified in the pancreatic head although relationship to the duct cannot be discerned on this noncontrast exam. Spleen: No splenomegaly. No focal mass lesion. Adrenals/Urinary Tract: No adrenal nodule or mass. Multiple cysts are noted in the right kidney, similar to prior. 2 mm nonobstructing stone identified upper pole right kidney. Small exophytic lesion lower pole left kidney measures 12 mm, not substantially changed. This has attenuation slightly higher than would be expected for a simple cyst. No evidence for hydroureter. The urinary bladder appears normal for the degree of distention. Stomach/Bowel: Stomach is unremarkable. No gastric wall thickening. No evidence of outlet obstruction. Duodenum is normally positioned as is the ligament of Treitz. Large duodenal diverticulum noted. No small bowel wall thickening. No small bowel dilatation. The appendix is normal. No gross colonic mass. No colonic wall thickening. Vascular/Lymphatic: There is moderate atherosclerotic calcification of the abdominal aorta without aneurysm. IVC filter visualized in situ. There is no gastrohepatic or hepatoduodenal ligament lymphadenopathy. No retroperitoneal or mesenteric lymphadenopathy. No pelvic sidewall lymphadenopathy. Reproductive: The prostate gland and seminal vesicles are unremarkable. Other: No intraperitoneal free fluid. Musculoskeletal: Right groin hernia contains small bowel loops without complicating features. Scattered sclerotic bone lesions appear stable in the interval compatible with metastatic involvement. IMPRESSION: 1. No new or progressive findings in the chest, abdomen, or pelvis to suggest  disease progression or new metastatic involvement. 2. 13 mm focus of ground-glass opacity in the posterior right middle lobe is likely infectious/inflammatory. Attention on follow-up recommended. 3. 9 x 6 mm right lower lobe pulmonary nodule measured previously is 7 x 4 mm today. 4. 2.5 x 1.6 cm nodular paraspinal fluid density structure is slightly increased in the interval since the most recent comparison study but smaller than on the 08/02/2021 exam. Given that this measures water density, it may represent loculated fluid. Small bilateral pleural effusions seen previously have resolved in the interval. 5. Scattered sclerotic bone lesions suggests metastatic disease. No substantial change. 6. Cholelithiasis. 7. Right groin hernia contains small bowel loops without complicating features. 8. 12 mm exophytic lesion lower pole left kidney has attenuation slightly higher than would be expected for a simple cyst. This has not substantially changed in the interval. Attention on follow-up recommended. 9. Aortic Atherosclerosis (ICD10-I70.0) and Emphysema (ICD10-J43.9). Electronically Signed   By: Kennith Center M.D.   On: 02/11/2022 13:04   CT Abdomen Pelvis Wo Contrast  Result Date: 02/11/2022 CLINICAL DATA:  Non-small-cell lung cancer.  Restaging. EXAM: CT CHEST, ABDOMEN AND PELVIS WITHOUT CONTRAST TECHNIQUE: Multidetector CT imaging of the chest, abdomen and pelvis was performed following the standard protocol without IV contrast. RADIATION DOSE REDUCTION: This exam was performed according to the departmental dose-optimization program which includes automated exposure control, adjustment of the mA and/or kV according to patient size and/or use of iterative reconstruction technique. COMPARISON:  11/06/2021 FINDINGS: CT CHEST FINDINGS Cardiovascular: The heart size  is normal. No substantial pericardial effusion. Coronary artery calcification is evident. Mild atherosclerotic calcification is noted in the wall of the  thoracic aorta. Mediastinum/Nodes: No mediastinal lymphadenopathy. No evidence for gross hilar lymphadenopathy although assessment is limited by the lack of intravenous contrast on the current study. The esophagus has normal imaging features. Small lymph nodes in the left axilla are stable. Lungs/Pleura: Centrilobular and paraseptal emphysema evident. 13 mm focus of ground-glass opacity in the posterior right middle lobe (image [phone]) is likely infectious/inflammatory. 9 x 6 mm right lower lobe pulmonary nodule measured previously is 7 x 4 mm today on image 71/506. No new suspicious pulmonary nodule or mass. No focal airspace consolidation. 2.5 x 1.6 cm nodular paraspinal fluid density structure (image [id number]) is slightly increased in the interval since the most recent comparison study but smaller than on the 08/02/2021 exam. Given that this measures water density and may represent loculated fluid. Musculoskeletal: No worrisome lytic or sclerotic osseous abnormality. CT ABDOMEN PELVIS FINDINGS Hepatobiliary: No suspicious focal abnormality in the liver on this study without intravenous contrast. Tiny calcified gallstones evident. No intrahepatic or extrahepatic biliary dilation. Pancreas: Diffuse dilatation of the main pancreatic duct again noted. 19 mm stone identified in the pancreatic head although relationship to the duct cannot be discerned on this noncontrast exam. Spleen: No splenomegaly. No focal mass lesion. Adrenals/Urinary Tract: No adrenal nodule or mass. Multiple cysts are noted in the right kidney, similar to prior. 2 mm nonobstructing stone identified upper pole right kidney. Small exophytic lesion lower pole left kidney measures 12 mm, not substantially changed. This has attenuation slightly higher than would be expected for a simple cyst. No evidence for hydroureter. The urinary bladder appears normal for the degree of distention. Stomach/Bowel: Stomach is unremarkable. No gastric wall thickening.  No evidence of outlet obstruction. Duodenum is normally positioned as is the ligament of Treitz. Large duodenal diverticulum noted. No small bowel wall thickening. No small bowel dilatation. The appendix is normal. No gross colonic mass. No colonic wall thickening. Vascular/Lymphatic: There is moderate atherosclerotic calcification of the abdominal aorta without aneurysm. IVC filter visualized in situ. There is no gastrohepatic or hepatoduodenal ligament lymphadenopathy. No retroperitoneal or mesenteric lymphadenopathy. No pelvic sidewall lymphadenopathy. Reproductive: The prostate gland and seminal vesicles are unremarkable. Other: No intraperitoneal free fluid. Musculoskeletal: Right groin hernia contains small bowel loops without complicating features. Scattered sclerotic bone lesions appear stable in the interval compatible with metastatic involvement. IMPRESSION: 1. No new or progressive findings in the chest, abdomen, or pelvis to suggest disease progression or new metastatic involvement. 2. 13 mm focus of ground-glass opacity in the posterior right middle lobe is likely infectious/inflammatory. Attention on follow-up recommended. 3. 9 x 6 mm right lower lobe pulmonary nodule measured previously is 7 x 4 mm today. 4. 2.5 x 1.6 cm nodular paraspinal fluid density structure is slightly increased in the interval since the most recent comparison study but smaller than on the 08/02/2021 exam. Given that this measures water density, it may represent loculated fluid. Small bilateral pleural effusions seen previously have resolved in the interval. 5. Scattered sclerotic bone lesions suggests metastatic disease. No substantial change. 6. Cholelithiasis. 7. Right groin hernia contains small bowel loops without complicating features. 8. 12 mm exophytic lesion lower pole left kidney has attenuation slightly higher than would be expected for a simple cyst. This has not substantially changed in the interval. Attention on  follow-up recommended. 9. Aortic Atherosclerosis (ICD10-I70.0) and Emphysema (ICD10-J43.9). Electronically Signed   By: Minerva Areola  Molli Posey M.D.   On: 02/11/2022 13:04    ASSESSMENT AND PLAN: This is a very pleasant 82 years old [place] male diagnosed with Stage IV (T1b, N3, M1 C) non-small cell lung cancer, adenocarcinoma with positive ALK gene translocation in February 2023. The patient is currently undergoing treatment with Alecensa (Alectinib) 600 mg p.o. twice daily.  He has been tolerating the treatment well with no concerning complaints.  He is status post 10 months of treatment.   The patient has been tolerating this treatment well with no concerning adverse effects. He had repeat CT scan of the chest, abdomen and pelvis performed recently.  I personally and independently reviewed the scan and discussed the result with the patient and his son. His scan showed no concerning findings for disease progression. I recommended for him to continue his current treatment with Alecensa (Alectinib) with the same dose.  Regarding the cholelithiasis that was seen on the scan, the son was concerned about this finding but the patient is currently asymptomatic and I do not think surgery would consider him for any intervention with his current condition. For the anemia, this is likely hemolytic anemia in addition to iron deficiency.  He will continue his current treatment with fusion plus. For the history of deep venous thrombosis he is currently on Eliquis 2.5 mg p.o. twice daily.  He still have the IVC filter but the recent Doppler of the lower extremity were negative for deep venous thrombosis and he is expected to have this filter removed by IR soon. For the lack of appetite, I will start the patient on a Medrol Dosepak. I will see him back for follow-up visit in 2 months for evaluation and repeat blood work. He was advised to call immediately if he has any concerning symptoms in the interval. All questions were  answered. The patient knows to call the clinic with any problems, questions or concerns. We can certainly see the patient much sooner if necessary.  The total time spent in the appointment was 35 minutes.  Disclaimer: This note was dictated with voice recognition software. Similar sounding words can inadvertently be transcribed and may not be corrected upon review.

## 2022-02-14 NOTE — Telephone Encounter (Signed)
Pt has been on plavix and eloquis. Local pharmacist notified to refill Eloquis.

## 2022-02-25 DIAGNOSIS — I6340 Cerebral infarction due to embolism of unspecified cerebral artery: Secondary | ICD-10-CM | POA: Diagnosis not present

## 2022-02-25 DIAGNOSIS — C3490 Malignant neoplasm of unspecified part of unspecified bronchus or lung: Secondary | ICD-10-CM | POA: Diagnosis not present

## 2022-03-05 ENCOUNTER — Other Ambulatory Visit (HOSPITAL_COMMUNITY): Payer: Self-pay

## 2022-03-06 ENCOUNTER — Other Ambulatory Visit: Payer: Self-pay | Admitting: Family Medicine

## 2022-03-08 ENCOUNTER — Telehealth: Payer: Self-pay | Admitting: Family Medicine

## 2022-03-08 NOTE — Telephone Encounter (Signed)
[name] son dropped off DMV form to be completed. Last DOS was 12/18/21. Placed in [employer].

## 2022-03-08 NOTE — Telephone Encounter (Signed)
Placed in MDs box to be filled out. Arthur [name], CMA  

## 2022-03-11 NOTE — Telephone Encounter (Signed)
Completed put in RN box

## 2022-03-12 NOTE — Telephone Encounter (Signed)
Daughter informed that the form was ready for pickup. Christen Bame, CMA   Copy made for batch scanning. Christen Bame, CMA

## 2022-03-13 ENCOUNTER — Other Ambulatory Visit: Payer: Self-pay

## 2022-03-14 ENCOUNTER — Other Ambulatory Visit (HOSPITAL_COMMUNITY): Payer: Self-pay

## 2022-03-28 DIAGNOSIS — C3490 Malignant neoplasm of unspecified part of unspecified bronchus or lung: Secondary | ICD-10-CM | POA: Diagnosis not present

## 2022-03-28 DIAGNOSIS — N2581 Secondary hyperparathyroidism of renal origin: Secondary | ICD-10-CM | POA: Diagnosis not present

## 2022-03-28 DIAGNOSIS — D631 Anemia in chronic kidney disease: Secondary | ICD-10-CM | POA: Diagnosis not present

## 2022-03-28 DIAGNOSIS — N1831 Chronic kidney disease, stage 3a: Secondary | ICD-10-CM | POA: Diagnosis not present

## 2022-03-28 DIAGNOSIS — I6340 Cerebral infarction due to embolism of unspecified cerebral artery: Secondary | ICD-10-CM | POA: Diagnosis not present

## 2022-04-04 ENCOUNTER — Other Ambulatory Visit: Payer: Self-pay

## 2022-04-04 ENCOUNTER — Other Ambulatory Visit: Payer: Self-pay | Admitting: Internal Medicine

## 2022-04-04 ENCOUNTER — Other Ambulatory Visit (HOSPITAL_COMMUNITY): Payer: Self-pay

## 2022-04-04 MED ORDER — ALECENSA 150 MG PO CAPS
600.0000 mg | ORAL_CAPSULE | Freq: Two times a day (BID) | ORAL | 3 refills | Status: DC
  Filled 2022-04-04: qty 240, 30d supply, fill #0
  Filled 2022-05-01: qty 240, 30d supply, fill #1
  Filled 2022-06-07: qty 240, 30d supply, fill #2
  Filled 2022-07-09: qty 240, 30d supply, fill #3

## 2022-04-09 ENCOUNTER — Other Ambulatory Visit (HOSPITAL_COMMUNITY): Payer: Self-pay

## 2022-04-10 ENCOUNTER — Other Ambulatory Visit (HOSPITAL_COMMUNITY): Payer: Self-pay

## 2022-04-13 ENCOUNTER — Other Ambulatory Visit: Payer: Self-pay | Admitting: Student

## 2022-04-13 ENCOUNTER — Other Ambulatory Visit (HOSPITAL_COMMUNITY): Payer: Self-pay

## 2022-04-15 ENCOUNTER — Inpatient Hospital Stay (HOSPITAL_BASED_OUTPATIENT_CLINIC_OR_DEPARTMENT_OTHER): Payer: 59 | Admitting: Internal Medicine

## 2022-04-15 ENCOUNTER — Other Ambulatory Visit: Payer: Self-pay

## 2022-04-15 ENCOUNTER — Inpatient Hospital Stay: Payer: 59 | Attending: Physician Assistant

## 2022-04-15 VITALS — BP 149/56 | HR 72 | Temp 98.2°F | Resp 19 | Wt 160.4 lb

## 2022-04-15 DIAGNOSIS — C3491 Malignant neoplasm of unspecified part of right bronchus or lung: Secondary | ICD-10-CM

## 2022-04-15 DIAGNOSIS — Z7901 Long term (current) use of anticoagulants: Secondary | ICD-10-CM | POA: Diagnosis not present

## 2022-04-15 DIAGNOSIS — Z86718 Personal history of other venous thrombosis and embolism: Secondary | ICD-10-CM | POA: Insufficient documentation

## 2022-04-15 DIAGNOSIS — C3490 Malignant neoplasm of unspecified part of unspecified bronchus or lung: Secondary | ICD-10-CM

## 2022-04-15 DIAGNOSIS — D649 Anemia, unspecified: Secondary | ICD-10-CM | POA: Diagnosis not present

## 2022-04-15 DIAGNOSIS — Z79899 Other long term (current) drug therapy: Secondary | ICD-10-CM | POA: Diagnosis not present

## 2022-04-15 DIAGNOSIS — D509 Iron deficiency anemia, unspecified: Secondary | ICD-10-CM | POA: Diagnosis not present

## 2022-04-15 LAB — CBC WITH DIFFERENTIAL (CANCER CENTER ONLY)
Abs Immature Granulocytes: 0.06 10*3/uL (ref 0.00–0.07)
Basophils Absolute: 0.1 10*3/uL (ref 0.0–0.1)
Basophils Relative: 1 %
Eosinophils Absolute: 0.3 10*3/uL (ref 0.0–0.5)
Eosinophils Relative: 3 %
HCT: 27.4 % — ABNORMAL LOW (ref 39.0–52.0)
Hemoglobin: 9.6 g/dL — ABNORMAL LOW (ref 13.0–17.0)
Immature Granulocytes: 1 %
Lymphocytes Relative: 21 %
Lymphs Abs: 2.3 10*3/uL (ref 0.7–4.0)
MCH: 29.4 pg (ref 26.0–34.0)
MCHC: 35 g/dL (ref 30.0–36.0)
MCV: 83.8 fL (ref 80.0–100.0)
Monocytes Absolute: 1.1 10*3/uL — ABNORMAL HIGH (ref 0.1–1.0)
Monocytes Relative: 10 %
Neutro Abs: 7.3 10*3/uL (ref 1.7–7.7)
Neutrophils Relative %: 64 %
Platelet Count: 311 10*3/uL (ref 150–400)
RBC: 3.27 MIL/uL — ABNORMAL LOW (ref 4.22–5.81)
RDW: 15.8 % — ABNORMAL HIGH (ref 11.5–15.5)
WBC Count: 11.1 10*3/uL — ABNORMAL HIGH (ref 4.0–10.5)
nRBC: 0 % (ref 0.0–0.2)

## 2022-04-15 LAB — CMP (CANCER CENTER ONLY)
ALT: 17 U/L (ref 0–44)
AST: 31 U/L (ref 15–41)
Albumin: 3.6 g/dL (ref 3.5–5.0)
Alkaline Phosphatase: 94 U/L (ref 38–126)
Anion gap: 6 (ref 5–15)
BUN: 27 mg/dL — ABNORMAL HIGH (ref 8–23)
CO2: 31 mmol/L (ref 22–32)
Calcium: 8.8 mg/dL — ABNORMAL LOW (ref 8.9–10.3)
Chloride: 103 mmol/L (ref 98–111)
Creatinine: 1.53 mg/dL — ABNORMAL HIGH (ref 0.61–1.24)
GFR, Estimated: 45 mL/min — ABNORMAL LOW (ref >60)
Glucose, Bld: 111 mg/dL — ABNORMAL HIGH (ref 70–99)
Potassium: 4.3 mmol/L (ref 3.5–5.1)
Sodium: 140 mmol/L (ref 135–145)
Total Bilirubin: 1.5 mg/dL — ABNORMAL HIGH (ref 0.3–1.2)
Total Protein: 7 g/dL (ref 6.5–8.1)

## 2022-04-15 LAB — IRON AND IRON BINDING CAPACITY (CC-WL,HP ONLY)
Iron: 94 ug/dL (ref 45–182)
Saturation Ratios: 34 % (ref 17.9–39.5)
TIBC: 279 ug/dL (ref 250–450)
UIBC: 185 ug/dL (ref 117–376)

## 2022-04-15 LAB — FERRITIN: Ferritin: 681 ng/mL — ABNORMAL HIGH (ref 24–336)

## 2022-04-15 NOTE — Progress Notes (Signed)
Arthur [name]) [phone]   [phone]  OFFICE PROGRESS NOTE  Lind Covert, MD [place] [address]  DIAGNOSIS:  1) Stage IV (T1b, N3, M1 C) non-small cell lung cancer, adenocarcinoma with positive ALK gene translocation diagnosed in February 2023. 2) deep venous thrombosis of the left lower extremity  PRIOR THERAPY: None  CURRENT THERAPY:  1) Alecensa (Alectinib) 600 mg p.o. twice daily.  First dose April 11, 2021.  Status post 12 months of treatment. 2) Eliquis 2.5 mg p.o. twice daily.  First dose 10/11/2021.    INTERVAL HISTORY: Arthur [name] 82 y.o. male returns to the clinic today for follow-up visit accompanied by his son.  The patient is feeling fine today with no concerning complaints.  He has more energy and gained weight compared to few months ago.  He is able to walk a little bit outside the house with a walker.  He denied having any current chest pain, shortness of breath, cough or hemoptysis.  The swelling in his lower extremity is better.  He has no fever or chills.  He has no headache or visual changes.  He continues to tolerate his treatment with Alecensa (Alectinib) fairly well.  He has no nausea, vomiting, diarrhea or constipation.  He is here today for evaluation and repeat blood work.  MEDICAL HISTORY: Past Medical History:  Diagnosis Date   Diabetes mellitus without complication ([place])    High cholesterol    Hypertension    lung ca    Normal nuclear stress test 01/29/2008   stress perfusion study apparently in 2010 in [facility] which he said was negative.    ALLERGIES:  is allergic to ace inhibitors and iohexol.  MEDICATIONS:  Current Outpatient Medications  Medication Sig Dispense Refill   alectinib (ALECENSA) 150 MG capsule Take 4 capsules (600 mg total) by mouth 2 (two) times daily with a meal. 240 capsule 3   apixaban (ELIQUIS) 2.5 MG TABS tablet Take 1 tablet (2.5 mg  total) by mouth 2 (two) times daily. 180 tablet 0   clopidogrel (PLAVIX) 75 MG tablet Take 1 tablet (75 mg total) by mouth daily. 90 tablet 1   furosemide (LASIX) 20 MG tablet 1 tablet p.o. daily on as-needed basis for swelling of the lower extremities. (Patient taking differently: Take 20 mg by mouth daily as needed for edema.) 20 tablet 0   Iron-FA-B Cmp-C-Biot-Probiotic (FUSION PLUS) CAPS Take 1 capsule by mouth daily.     methylPREDNISolone (MEDROL DOSEPAK) 4 MG TBPK tablet Use as instructed. 21 tablet 0   omeprazole (PRILOSEC) 20 MG capsule Take 1 capsule (20 mg total) by mouth daily as needed. (Patient taking differently: Take 20 mg by mouth daily as needed (acid reflux).) 90 capsule 0   polyethylene glycol (MIRALAX / GLYCOLAX) 17 g packet Take 17 g by mouth daily as needed for moderate constipation.     rosuvastatin (CRESTOR) 5 MG tablet Take 1 tablet by mouth once daily 90 tablet 1   No current facility-administered medications for this visit.    SURGICAL HISTORY:  Past Surgical History:  Procedure Laterality Date   CATARACT EXTRACTION, BILATERAL  2006   CORONARY STENT INTERVENTION N/A 02/06/2021   Procedure: CORONARY STENT INTERVENTION;  Surgeon: Jettie Booze, MD;  Location: [facility];  Service: Cardiovascular;  Laterality: N/A;   FINE NEEDLE ASPIRATION  03/21/2021   Procedure: FINE NEEDLE ASPIRATION;  Surgeon: Garner Nash, DO;  Location: [facility];  Service: Pulmonary;;   INTRAVASCULAR ULTRASOUND/IVUS N/A 02/06/2021   Procedure: Intravascular Ultrasound/IVUS;  Surgeon: Jettie Booze, MD;  Location: [facility];  Service: Cardiovascular;  Laterality: N/A;   IR RADIOLOGIST EVAL & MGMT  12/24/2021   KNEE SURGERY  2005   LEFT HEART CATH AND CORONARY ANGIOGRAPHY N/A 02/06/2021   Procedure: LEFT HEART CATH AND CORONARY ANGIOGRAPHY;  Surgeon: Jettie Booze, MD;  Location: [facility];  Service: Cardiovascular;  Laterality: N/A;   VIDEO  BRONCHOSCOPY WITH ENDOBRONCHIAL ULTRASOUND Bilateral 03/21/2021   Procedure: VIDEO BRONCHOSCOPY WITH ENDOBRONCHIAL ULTRASOUND;  Surgeon: Garner Nash, DO;  Location: [facility];  Service: Pulmonary;  Laterality: Bilateral;    REVIEW OF SYSTEMS:  A comprehensive review of systems was negative except for: Constitutional: positive for fatigue Musculoskeletal: positive for muscle weakness   PHYSICAL EXAMINATION: General appearance: alert, cooperative, fatigued, and no distress Head: Normocephalic, without obvious abnormality, atraumatic Neck: no adenopathy, no JVD, supple, symmetrical, trachea midline, and thyroid not enlarged, symmetric, no tenderness/mass/nodules Lymph nodes: Cervical, supraclavicular, and axillary nodes normal. Resp: clear to auscultation bilaterally Back: symmetric, no curvature. ROM normal. No CVA tenderness. Cardio: regular rate and rhythm, S1, S2 normal, no murmur, click, rub or gallop GI: soft, non-tender; bowel sounds normal; no masses,  no organomegaly Extremities: edema trace edema in the left lower extremity  ECOG PERFORMANCE STATUS: 1 - Symptomatic but completely ambulatory  Blood pressure (!) 149/56, pulse 72, temperature 98.2 F (36.8 C), temperature source Oral, resp. rate 19, weight 160 lb 6 oz (72.7 kg), SpO2 100 %.  LABORATORY DATA: Lab Results  Component Value Date   WBC 11.1 (H) 04/15/2022   HGB 9.6 (L) 04/15/2022   HCT 27.4 (L) 04/15/2022   MCV 83.8 04/15/2022   PLT 311 04/15/2022      Chemistry      Component Value Date/Time   NA 137 02/11/2022 0948   NA 140 07/17/2021 1103   K 4.2 02/11/2022 0948   CL 104 02/11/2022 0948   CO2 27 02/11/2022 0948   BUN 27 (H) 02/11/2022 0948   BUN 21 10/09/2021 1147   CREATININE 1.46 (H) 02/11/2022 0948   CREATININE 1.18 11/15/2015 1114      Component Value Date/Time   CALCIUM 8.7 (L) 02/11/2022 0948   ALKPHOS 94 02/11/2022 0948   AST 29 02/11/2022 0948   ALT 15 02/11/2022 0948   BILITOT 1.4  (H) 02/11/2022 0948       RADIOGRAPHIC STUDIES: No results found.  ASSESSMENT AND PLAN: This is a very pleasant 82 years old [place] male diagnosed with Stage IV (T1b, N3, M1 C) non-small cell lung cancer, adenocarcinoma with positive ALK gene translocation in February 2023. The patient is currently undergoing treatment with Alecensa (Alectinib) 600 mg p.o. twice daily.  He has been tolerating the treatment well with no concerning complaints.  He is status post 12 months of treatment.   The patient is currently tolerating his treatment with Alecensa (Alectinib) fairly well with no concerning adverse effects. Repeat CBC today showed improvement of his hemoglobin and hematocrit but he still has anemia.  Comprehensive metabolic panel is still pending. I recommended for him to continue his treatment with the current dose. I will see him back for follow-up visit in 2 months for evaluation with repeat CT scan of the chest, abdomen and pelvis for restaging of his disease. For the anemia, this is likely hemolytic anemia in addition to iron deficiency.  He will  continue his current treatment with fusion plus. For the history of deep venous thrombosis he is currently on Eliquis 2.5 mg p.o. twice daily.   The patient was advised to call immediately if he has any other concerning symptoms in the interval. All questions were answered. The patient knows to call the clinic with any problems, questions or concerns. We can certainly see the patient much sooner if necessary.  The total time spent in the appointment was 20 minutes.  Disclaimer: This note was dictated with voice recognition software. Similar sounding words can inadvertently be transcribed and may not be corrected upon review.

## 2022-04-22 ENCOUNTER — Ambulatory Visit (INDEPENDENT_AMBULATORY_CARE_PROVIDER_SITE_OTHER): Payer: 59 | Admitting: Student

## 2022-04-22 ENCOUNTER — Encounter: Payer: Self-pay | Admitting: Student

## 2022-04-22 ENCOUNTER — Other Ambulatory Visit: Payer: Self-pay

## 2022-04-22 VITALS — BP 123/60 | HR 68

## 2022-04-22 DIAGNOSIS — K4090 Unilateral inguinal hernia, without obstruction or gangrene, not specified as recurrent: Secondary | ICD-10-CM | POA: Diagnosis not present

## 2022-04-22 NOTE — Progress Notes (Signed)
    SUBJECTIVE:   CHIEF COMPLAINT / HPI:   Inguinal Hernia Patient with known large R inguinal hernia demonstrated on CT. Here for follow-up due to recurrent issues with the hernia "coming out." It is always easily reducible, but is uncomfortable when this happens. At his last visit with Dr. Deirdre Priest they discussed the possibility of surgical referral but decision made at that time to wait until things had settled with his cancer care and he might be a better surgical candidate. He treats the discomfort associated with his hernia with tylenol which does seem to help.   PERTINENT  PMH / PSH: Stage IV Lung Cancer on treatment with Alectinib. DVT. Cardiac Arrest (01/2021)  OBJECTIVE:   BP 123/60   Pulse 68   SpO2 100%   Gen: Elderly man, well-appearing and NAD Abd: Obvious swelling in the R groin consistent with known inguinal hernia, non-tender and without redness or induration  ASSESSMENT/PLAN:   Inguinal hernia At this point it seems wise to continue with conservative management. No concerns for incarceration and, in fact, his hernia is large enough that the risk of incarceration is likely low. Will continue cancer treatment and would only consider surgical referral at such a time that he remains stable and would be considered a better surgical candidate.  - Continue PRN Tylenol for pain - A hernia belt would be acceptable, discussed where to find these online - ER precautions reviewed for evidence of incarcerated bowel      J Dorothyann Gibbs, MD [facility]

## 2022-04-22 NOTE — Patient Instructions (Addendum)
You can take Claritin for your allergies.  For your hernia, you could try a hernia belt (you can get these on [place]). For the intermittent, mild pain, you can take 1 tablet of Tylenol up to four times per day.   Once things are more stable with Dr. Julien Nordmann, we can have a discussion with him and Dr. Erin Hearing about if/when surgery may be an option. . If you develop SEVERE pain with nausea +/- vomiting, you should go to the emergency department.   Marnee Guarneri, MD

## 2022-04-23 ENCOUNTER — Ambulatory Visit: Payer: 59 | Admitting: Family Medicine

## 2022-04-23 NOTE — Assessment & Plan Note (Signed)
At this point it seems wise to continue with conservative management. No concerns for incarceration and, in fact, his hernia is large enough that the risk of incarceration is likely low. Will continue cancer treatment and would only consider surgical referral at such a time that he remains stable and would be considered a better surgical candidate.  - Continue PRN Tylenol for pain - A hernia belt would be acceptable, discussed where to find these online - ER precautions reviewed for evidence of incarcerated bowel

## 2022-04-26 DIAGNOSIS — I6340 Cerebral infarction due to embolism of unspecified cerebral artery: Secondary | ICD-10-CM | POA: Diagnosis not present

## 2022-04-26 DIAGNOSIS — C3490 Malignant neoplasm of unspecified part of unspecified bronchus or lung: Secondary | ICD-10-CM | POA: Diagnosis not present

## 2022-05-01 ENCOUNTER — Other Ambulatory Visit (HOSPITAL_COMMUNITY): Payer: Self-pay

## 2022-05-06 ENCOUNTER — Other Ambulatory Visit: Payer: Self-pay | Admitting: Internal Medicine

## 2022-05-07 ENCOUNTER — Ambulatory Visit: Payer: 59 | Admitting: Family Medicine

## 2022-05-08 ENCOUNTER — Other Ambulatory Visit (HOSPITAL_COMMUNITY): Payer: Self-pay

## 2022-05-29 ENCOUNTER — Other Ambulatory Visit: Payer: Self-pay

## 2022-05-30 ENCOUNTER — Telehealth: Payer: Self-pay | Admitting: Internal Medicine

## 2022-05-30 NOTE — Telephone Encounter (Signed)
Called patient regarding upcoming May appointments, patient is notified.  ?

## 2022-06-01 ENCOUNTER — Other Ambulatory Visit: Payer: Self-pay | Admitting: Family Medicine

## 2022-06-03 ENCOUNTER — Other Ambulatory Visit: Payer: Self-pay

## 2022-06-05 ENCOUNTER — Other Ambulatory Visit: Payer: Self-pay

## 2022-06-07 ENCOUNTER — Other Ambulatory Visit: Payer: Self-pay

## 2022-06-10 ENCOUNTER — Other Ambulatory Visit: Payer: Self-pay

## 2022-06-10 ENCOUNTER — Inpatient Hospital Stay: Payer: 59 | Attending: Physician Assistant

## 2022-06-10 ENCOUNTER — Ambulatory Visit (HOSPITAL_COMMUNITY)
Admission: RE | Admit: 2022-06-10 | Discharge: 2022-06-10 | Disposition: A | Discharge status: Home / Self Care | Payer: 59 | Source: Ambulatory Visit | Attending: Internal Medicine | Admitting: Internal Medicine

## 2022-06-10 DIAGNOSIS — D649 Anemia, unspecified: Secondary | ICD-10-CM | POA: Insufficient documentation

## 2022-06-10 DIAGNOSIS — Z79899 Other long term (current) drug therapy: Secondary | ICD-10-CM | POA: Diagnosis not present

## 2022-06-10 DIAGNOSIS — Z7901 Long term (current) use of anticoagulants: Secondary | ICD-10-CM | POA: Diagnosis not present

## 2022-06-10 DIAGNOSIS — C7800 Secondary malignant neoplasm of unspecified lung: Secondary | ICD-10-CM | POA: Diagnosis not present

## 2022-06-10 DIAGNOSIS — C3490 Malignant neoplasm of unspecified part of unspecified bronchus or lung: Secondary | ICD-10-CM

## 2022-06-10 DIAGNOSIS — Z86718 Personal history of other venous thrombosis and embolism: Secondary | ICD-10-CM | POA: Insufficient documentation

## 2022-06-10 DIAGNOSIS — J439 Emphysema, unspecified: Secondary | ICD-10-CM | POA: Diagnosis not present

## 2022-06-10 DIAGNOSIS — C801 Malignant (primary) neoplasm, unspecified: Secondary | ICD-10-CM | POA: Diagnosis not present

## 2022-06-10 DIAGNOSIS — K8020 Calculus of gallbladder without cholecystitis without obstruction: Secondary | ICD-10-CM | POA: Diagnosis not present

## 2022-06-10 LAB — CMP (CANCER CENTER ONLY)
ALT: 17 U/L (ref 0–44)
AST: 29 U/L (ref 15–41)
Albumin: 3.7 g/dL (ref 3.5–5.0)
Alkaline Phosphatase: 87 U/L (ref 38–126)
Anion gap: 7 (ref 5–15)
BUN: 25 mg/dL — ABNORMAL HIGH (ref 8–23)
CO2: 26 mmol/L (ref 22–32)
Calcium: 8.8 mg/dL — ABNORMAL LOW (ref 8.9–10.3)
Chloride: 107 mmol/L (ref 98–111)
Creatinine: 1.34 mg/dL — ABNORMAL HIGH (ref 0.61–1.24)
GFR, Estimated: 53 mL/min — ABNORMAL LOW (ref >60)
Glucose, Bld: 104 mg/dL — ABNORMAL HIGH (ref 70–99)
Potassium: 4.7 mmol/L (ref 3.5–5.1)
Sodium: 140 mmol/L (ref 135–145)
Total Bilirubin: 1.5 mg/dL — ABNORMAL HIGH (ref 0.3–1.2)
Total Protein: 7 g/dL (ref 6.5–8.1)

## 2022-06-10 LAB — CBC WITH DIFFERENTIAL (CANCER CENTER ONLY)
Abs Immature Granulocytes: 0.07 10*3/uL (ref 0.00–0.07)
Basophils Absolute: 0.1 10*3/uL (ref 0.0–0.1)
Basophils Relative: 1 %
Eosinophils Absolute: 0.3 10*3/uL (ref 0.0–0.5)
Eosinophils Relative: 3 %
HCT: 27.8 % — ABNORMAL LOW (ref 39.0–52.0)
Hemoglobin: 9.1 g/dL — ABNORMAL LOW (ref 13.0–17.0)
Immature Granulocytes: 1 %
Lymphocytes Relative: 25 %
Lymphs Abs: 2.4 10*3/uL (ref 0.7–4.0)
MCH: 28.9 pg (ref 26.0–34.0)
MCHC: 32.7 g/dL (ref 30.0–36.0)
MCV: 88.3 fL (ref 80.0–100.0)
Monocytes Absolute: 1.1 10*3/uL — ABNORMAL HIGH (ref 0.1–1.0)
Monocytes Relative: 12 %
Neutro Abs: 5.6 10*3/uL (ref 1.7–7.7)
Neutrophils Relative %: 58 %
Platelet Count: 317 10*3/uL (ref 150–400)
RBC: 3.15 MIL/uL — ABNORMAL LOW (ref 4.22–5.81)
RDW: 16.1 % — ABNORMAL HIGH (ref 11.5–15.5)
WBC Count: 9.6 10*3/uL (ref 4.0–10.5)
nRBC: 0 % (ref 0.0–0.2)

## 2022-06-11 ENCOUNTER — Other Ambulatory Visit (HOSPITAL_COMMUNITY): Payer: Self-pay

## 2022-06-13 ENCOUNTER — Inpatient Hospital Stay (HOSPITAL_BASED_OUTPATIENT_CLINIC_OR_DEPARTMENT_OTHER): Payer: 59 | Admitting: Internal Medicine

## 2022-06-13 ENCOUNTER — Other Ambulatory Visit: Payer: Self-pay

## 2022-06-13 ENCOUNTER — Other Ambulatory Visit (HOSPITAL_COMMUNITY): Payer: Self-pay

## 2022-06-13 VITALS — BP 130/40 | HR 69 | Temp 97.9°F | Resp 15 | Wt 161.0 lb

## 2022-06-13 DIAGNOSIS — Z86718 Personal history of other venous thrombosis and embolism: Secondary | ICD-10-CM | POA: Diagnosis not present

## 2022-06-13 DIAGNOSIS — C3492 Malignant neoplasm of unspecified part of left bronchus or lung: Secondary | ICD-10-CM | POA: Diagnosis not present

## 2022-06-13 DIAGNOSIS — Z79899 Other long term (current) drug therapy: Secondary | ICD-10-CM | POA: Diagnosis not present

## 2022-06-13 DIAGNOSIS — C3490 Malignant neoplasm of unspecified part of unspecified bronchus or lung: Secondary | ICD-10-CM | POA: Diagnosis not present

## 2022-06-13 DIAGNOSIS — Z7901 Long term (current) use of anticoagulants: Secondary | ICD-10-CM | POA: Diagnosis not present

## 2022-06-13 DIAGNOSIS — D649 Anemia, unspecified: Secondary | ICD-10-CM | POA: Diagnosis not present

## 2022-06-13 NOTE — Progress Notes (Signed)
[facility] [phone]   Fax:([phone]  OFFICE PROGRESS NOTE  Carney Living, MD [address] [place] [address]  DIAGNOSIS:  1) Stage IV (T1b, N3, M1 C) non-small cell lung cancer, adenocarcinoma with positive ALK gene translocation diagnosed in February 2023. 2) deep venous thrombosis of the left lower extremity  PRIOR THERAPY: None  CURRENT THERAPY:  1) Alecensa (Alectinib) 600 mg p.o. twice daily.  First dose April 11, 2021.  Status post 14 months of treatment. 2) Eliquis 2.5 mg p.o. twice daily.  First dose 10/11/2021.    INTERVAL HISTORY: Arthur [name] 82 y.o. male returns to the clinic today for follow-up visit accompanied by his son.  The patient is feeling fine today with no concerning complaints except for mild fatigue.  The swelling of the lower extremity has significantly improved.  He denied having any chest pain, shortness of breath except with exertion with no cough or hemoptysis.  He has no nausea, vomiting, diarrhea or constipation.  He has no headache or visual changes.  He has no recent weight loss or night sweats.  He is here today for evaluation and repeat CT scan of the chest for restaging of his disease.   MEDICAL HISTORY: Past Medical History:  Diagnosis Date   Diabetes mellitus without complication (HCC)    High cholesterol    Hypertension    lung ca    Normal nuclear stress test 01/29/2008   stress perfusion study apparently in 2010 in [facility] which he said was negative.    ALLERGIES:  is allergic to ace inhibitors and iohexol.  MEDICATIONS:  Current Outpatient Medications  Medication Sig Dispense Refill   benzonatate (TESSALON) 100 MG capsule TAKE 1 CAPSULE BY MOUTH TWICE DAILY AS NEEDED FOR COUGH 15 capsule 0   alectinib (ALECENSA) 150 MG capsule Take 4 capsules (600 mg total) by mouth 2 (two) times daily with a meal. 240 capsule 3   clopidogrel (PLAVIX) 75 MG tablet Take 1  tablet by mouth once daily with breakfast 90 tablet 0   ELIQUIS 2.5 MG TABS tablet Take 1 tablet by mouth twice daily 180 tablet 0   furosemide (LASIX) 20 MG tablet 1 tablet p.o. daily on as-needed basis for swelling of the lower extremities. (Patient taking differently: Take 20 mg by mouth daily as needed for edema.) 20 tablet 0   Iron-FA-B Cmp-C-Biot-Probiotic (FUSION PLUS) CAPS Take 1 capsule by mouth daily.     methylPREDNISolone (MEDROL DOSEPAK) 4 MG TBPK tablet Use as instructed. 21 tablet 0   omeprazole (PRILOSEC) 20 MG capsule Take 1 capsule (20 mg total) by mouth daily as needed. (Patient taking differently: Take 20 mg by mouth daily as needed (acid reflux).) 90 capsule 0   polyethylene glycol (MIRALAX / GLYCOLAX) 17 g packet Take 17 g by mouth daily as needed for moderate constipation.     rosuvastatin (CRESTOR) 5 MG tablet Take 1 tablet by mouth once daily 90 tablet 1   No current facility-administered medications for this visit.    SURGICAL HISTORY:  Past Surgical History:  Procedure Laterality Date   CATARACT EXTRACTION, BILATERAL  2006   CORONARY STENT INTERVENTION N/A 02/06/2021   Procedure: CORONARY STENT INTERVENTION;  Surgeon: Corky Crafts, MD;  [facility]: [facility] INVAS[facility] CV LAB;  Service: Cardiovascular;  Laterality: N/A;   CORONARY ULTRASOUND/IVUS N/A 02/06/2021   Procedure: Intravascular Ultrasound/IVUS;  Surgeon: Corky Crafts, MD;  Location: [facility] INVASIVE CV LAB;  Service: Cardiovascular;  Laterality: N/A;   FINE NEEDLE ASPIRATION  03/21/2021   Procedure: FINE NEEDLE ASPIRATION;  Surgeon: Josephine Igo, DO;  Location: [facility] ENDOS[facility];  Service: Pulmonary;;   IR RADIOLOGIST EVAL & MGMT  12/24/2021   KNEE SURGERY  2005   LEFT HEART CATH AND CORONARY ANGIOGRAPHY N/A 02/06/2021   Procedure: LEFT HEART CATH AND CORONARY ANGIOGRAPHY;  Surgeon: Corky Crafts, MD;  Location: [facility] INVASIVE CV LAB;  Service: Cardiovascular;  Laterality: N/A;   VIDEO BRONCHOSCOPY WITH  ENDOBRONCHIAL ULTRASOUND Bilateral 03/21/2021   Procedure: VIDEO BRONCHOSCOPY WITH ENDOBRONCHIAL ULTRASOUND;  Surgeon: Josephine Igo, DO;  Location: [facility] ENDOS[facility];  Service: Pulmonary;  Laterality: Bilateral;    REVIEW OF SYSTEMS:  Constitutional: positive for fatigue Eyes: negative Ears, nose, mouth, throat, and face: negative Respiratory: positive for dyspnea on exertion Cardiovascular: negative Gastrointestinal: negative Genitourinary:negative Integument/breast: negative Hematologic/lymphatic: negative Musculoskeletal:negative Neurological: negative Behavioral/Psych: negative Endocrine: negative Allergic/Immunologic: negative   PHYSICAL EXAMINATION: General appearance: alert, cooperative, fatigued, and no distress Head: Normocephalic, without obvious abnormality, atraumatic Neck: no adenopathy, no JVD, supple, symmetrical, trachea midline, and thyroid not enlarged, symmetric, no tenderness/mass/nodules Lymph nodes: Cervical, supraclavicular, and axillary nodes normal. Resp: clear to auscultation bilaterally Back: symmetric, no curvature. ROM normal. No CVA tenderness. Cardio: regular rate and rhythm, S1, S2 normal, no murmur, click, rub or gallop GI: soft, non-tender; bowel sounds normal; no masses,  no organomegaly Extremities: edema trace edema in the left lower extremity Neurologic: Alert and oriented X 3, normal strength and tone. Normal symmetric reflexes. Normal coordination and gait  ECOG PERFORMANCE STATUS: 1 - Symptomatic but completely ambulatory  Blood pressure (!) 130/40, pulse 69, temperature 97.9 F (36.6 C), temperature source Temporal, resp. rate 15, weight 161 lb (73 kg), SpO2 100 %.  LABORATORY DATA: Lab Results  Component Value Date   WBC 9.6 06/10/2022   HGB 9.1 (L) 06/10/2022   HCT 27.8 (L) 06/10/2022   MCV 88.3 06/10/2022   PLT 317 06/10/2022      Chemistry      Component Value Date/Time   NA 140 06/10/2022 1036   NA 140 07/17/2021 1103   K  4.7 06/10/2022 1036   CL 107 06/10/2022 1036   CO2 26 06/10/2022 1036   BUN 25 (H) 06/10/2022 1036   BUN 21 10/09/2021 1147   CREATININE 1.34 (H) 06/10/2022 1036   CREATININE 1.18 11/15/2015 1114      Component Value Date/Time   CALCIUM 8.8 (L) 06/10/2022 1036   ALKPHOS 87 06/10/2022 1036   AST 29 06/10/2022 1036   ALT 17 06/10/2022 1036   BILITOT 1.5 (H) 06/10/2022 1036       RADIOGRAPHIC STUDIES: CT Chest Wo Contrast  Result Date: 06/11/2022 CLINICAL DATA:  Metastatic lung cancer restaging * Tracking Code: BO * EXAM: CT CHEST, ABDOMEN AND PELVIS WITHOUT CONTRAST TECHNIQUE: Multidetector CT imaging of the chest, abdomen and pelvis was performed following the standard protocol without IV contrast. RADIATION DOSE REDUCTION: This exam was performed according to the departmental dose-optimization program which includes automated exposure control, adjustment of the mA and/or kV according to patient size and/or use of iterative reconstruction technique. COMPARISON:  02/11/2022 FINDINGS: CT CHEST FINDINGS Cardiovascular: Aortic atherosclerosis. Aortic valve calcifications. Normal heart size. Three-vessel coronary artery calcifications and or stents. No pericardial effusion. Mediastinum/Nodes: No enlarged mediastinal, hilar, or axillary lymph nodes. Thyroid gland, trachea, and esophagus demonstrate no significant findings. Lungs/Pleura: Mild emphysema. Diffuse bilateral bronchial wall thickening. Unchanged pleural fluid loculation underlying the medial right  lower lobe (series 2, image 73). Unchanged spiculated nodule of the dependent right lower lobe measuring 0.5 cm (series 6, image 61). Interval resolution of a previously seen ground-glass opacity of the posterior right middle lobe (series 6, image 96). No free-flowing pleural effusion or pneumothorax. Musculoskeletal: No chest wall abnormality. No acute osseous findings. CT ABDOMEN PELVIS FINDINGS Hepatobiliary: No solid liver abnormality is seen.  Gallstones. No gallbladder wall thickening, or biliary dilatation. Pancreas: Severely atrophic pancreatic parenchyma, with diffuse pancreatic ductal dilatation and large, dense calculi in the central pancreatic head (series 2, image 69). Diffuse, coarse pancreatic parenchymal calcification (series 2, image 52). Spleen: Normal in size without significant abnormality. Adrenals/Urinary Tract: Adrenal glands are unremarkable. Small nonobstructive calculus of the superior pole of the right kidney. No left-sided calculi, ureteral calculi, or hydronephrosis. Simple, benign right renal cortical cysts, for which no further follow-up or characterization is required. Thickening of the decompressed urinary bladder, likely secondary to chronic outlet obstruction Stomach/Bowel: Stomach is within normal limits. Appendix appears normal. No evidence of bowel wall thickening, distention, or inflammatory changes. Vascular/Lymphatic: Aortic atherosclerosis. Infrarenal IVC filter. No enlarged abdominal or pelvic lymph nodes. Reproductive: No mass or other abnormality. Other: Large right inguinal hernia, incompletely imaged, containing nonobstructed loops of small bowel (series 2, image 117). No ascites. Musculoskeletal: No acute osseous findings. Unchanged, occasional ill-defined sclerotic osseous lesions again suggesting treated osseous metastatic disease, for example of the T11 vertebral body (series 8, image 78). Bridging osteophytosis throughout the thoracic and upper lumbar spine, in keeping with DISH. IMPRESSION: 1. Unchanged spiculated nodule of the dependent right lower lobe measuring 0.5 cm. 2. Interval resolution of a previously seen ground-glass opacity of the posterior right middle lobe, consistent with resolved infection or inflammation. 3. Unchanged pleural fluid loculation underlying the medial right lower lobe. 4. Unchanged, occasional ill-defined sclerotic osseous lesions again suggesting treated osseous metastatic  disease. 5. No noncontrast evidence of new metastatic disease in the chest, abdomen, or pelvis. 6. Emphysema and diffuse bilateral bronchial wall thickening. 7. Coronary artery disease. 8. Cholelithiasis. 9. Large right inguinal hernia, incompletely imaged, containing nonobstructed loops of small bowel. Aortic Atherosclerosis (ICD10-I70.0) and Emphysema (ICD10-J43.9). Electronically Signed   By: Jearld Lesch M.D.   On: 06/11/2022 14:19   CT Abdomen Pelvis Wo Contrast  Result Date: 06/11/2022 CLINICAL DATA:  Metastatic lung cancer restaging * Tracking Code: BO * EXAM: CT CHEST, ABDOMEN AND PELVIS WITHOUT CONTRAST TECHNIQUE: Multidetector CT imaging of the chest, abdomen and pelvis was performed following the standard protocol without IV contrast. RADIATION DOSE REDUCTION: This exam was performed according to the departmental dose-optimization program which includes automated exposure control, adjustment of the mA and/or kV according to patient size and/or use of iterative reconstruction technique. COMPARISON:  02/11/2022 FINDINGS: CT CHEST FINDINGS Cardiovascular: Aortic atherosclerosis. Aortic valve calcifications. Normal heart size. Three-vessel coronary artery calcifications and or stents. No pericardial effusion. Mediastinum/Nodes: No enlarged mediastinal, hilar, or axillary lymph nodes. Thyroid gland, trachea, and esophagus demonstrate no significant findings. Lungs/Pleura: Mild emphysema. Diffuse bilateral bronchial wall thickening. Unchanged pleural fluid loculation underlying the medial right lower lobe (series 2, image 73). Unchanged spiculated nodule of the dependent right lower lobe measuring 0.5 cm (series 6, image 61). Interval resolution of a previously seen ground-glass opacity of the posterior right middle lobe (series 6, image 96). No free-flowing pleural effusion or pneumothorax. Musculoskeletal: No chest wall abnormality. No acute osseous findings. CT ABDOMEN PELVIS FINDINGS Hepatobiliary: No  solid liver abnormality is seen. Gallstones. No gallbladder wall thickening,  or biliary dilatation. Pancreas: Severely atrophic pancreatic parenchyma, with diffuse pancreatic ductal dilatation and large, dense calculi in the central pancreatic head (series 2, image 69). Diffuse, coarse pancreatic parenchymal calcification (series 2, image 52). Spleen: Normal in size without significant abnormality. Adrenals/Urinary Tract: Adrenal glands are unremarkable. Small nonobstructive calculus of the superior pole of the right kidney. No left-sided calculi, ureteral calculi, or hydronephrosis. Simple, benign right renal cortical cysts, for which no further follow-up or characterization is required. Thickening of the decompressed urinary bladder, likely secondary to chronic outlet obstruction Stomach/Bowel: Stomach is within normal limits. Appendix appears normal. No evidence of bowel wall thickening, distention, or inflammatory changes. Vascular/Lymphatic: Aortic atherosclerosis. Infrarenal IVC filter. No enlarged abdominal or pelvic lymph nodes. Reproductive: No mass or other abnormality. Other: Large right inguinal hernia, incompletely imaged, containing nonobstructed loops of small bowel (series 2, image 117). No ascites. Musculoskeletal: No acute osseous findings. Unchanged, occasional ill-defined sclerotic osseous lesions again suggesting treated osseous metastatic disease, for example of the T11 vertebral body (series 8, image 78). Bridging osteophytosis throughout the thoracic and upper lumbar spine, in keeping with DISH. IMPRESSION: 1. Unchanged spiculated nodule of the dependent right lower lobe measuring 0.5 cm. 2. Interval resolution of a previously seen ground-glass opacity of the posterior right middle lobe, consistent with resolved infection or inflammation. 3. Unchanged pleural fluid loculation underlying the medial right lower lobe. 4. Unchanged, occasional ill-defined sclerotic osseous lesions again  suggesting treated osseous metastatic disease. 5. No noncontrast evidence of new metastatic disease in the chest, abdomen, or pelvis. 6. Emphysema and diffuse bilateral bronchial wall thickening. 7. Coronary artery disease. 8. Cholelithiasis. 9. Large right inguinal hernia, incompletely imaged, containing nonobstructed loops of small bowel. Aortic Atherosclerosis (ICD10-I70.0) and Emphysema (ICD10-J43.9). Electronically Signed   By: Jearld Lesch M.D.   On: 06/11/2022 14:19    ASSESSMENT AND PLAN: This is a very pleasant 82 years old [place] male diagnosed with Stage IV (T1b, N3, M1 C) non-small cell lung cancer, adenocarcinoma with positive ALK gene translocation in February 2023. The patient is currently undergoing treatment with Alecensa (Alectinib) 600 mg p.o. twice daily.  He has been tolerating the treatment well with no concerning complaints.  He is status post 14 months of treatment.   The patient has been tolerating this treatment well with no concerning adverse effects. He had repeat CT scan of the chest, abdomen and pelvis performed recently.  I personally and independently reviewed the scan and discussed the result with the patient and his son. His scan showed no concerning findings for disease progression and the patient has no significant adverse effects from the treatment. I recommended for him to continue his current treatment with Alecensa (Alectinib) with the same dose. For the anemia, likely combination of anemia of chronic disease, iron deficiency and hemolytic anemia.  He will continue his current treatment with fusion plus and I advised him to also take multivitamins and B12 supplement orally. For the history of deep venous thrombosis, he will continue his current treatment with Eliquis 2.5 mg p.o. twice daily. I will see him back for follow-up visit for evaluation with repeat blood work in 6 weeks. The patient was advised to call immediately if he has any other concerning symptoms in  the interval.  All questions were answered. The patient knows to call the clinic with any problems, questions or concerns. We can certainly see the patient much sooner if necessary.  The total time spent in the appointment was 30 minutes.  Disclaimer: This  note was dictated with voice recognition software. Similar sounding words can inadvertently be transcribed and may not be corrected upon review.

## 2022-07-09 ENCOUNTER — Other Ambulatory Visit (HOSPITAL_COMMUNITY): Payer: Self-pay

## 2022-07-15 ENCOUNTER — Other Ambulatory Visit: Payer: Self-pay | Admitting: Family Medicine

## 2022-07-25 ENCOUNTER — Other Ambulatory Visit: Payer: Self-pay

## 2022-07-25 ENCOUNTER — Inpatient Hospital Stay (HOSPITAL_BASED_OUTPATIENT_CLINIC_OR_DEPARTMENT_OTHER): Payer: 59 | Admitting: Internal Medicine

## 2022-07-25 ENCOUNTER — Inpatient Hospital Stay: Payer: 59 | Attending: Physician Assistant

## 2022-07-25 VITALS — BP 146/52 | HR 72 | Temp 97.5°F | Resp 17 | Ht 71.0 in | Wt 162.6 lb

## 2022-07-25 DIAGNOSIS — C3490 Malignant neoplasm of unspecified part of unspecified bronchus or lung: Secondary | ICD-10-CM

## 2022-07-25 DIAGNOSIS — Z7901 Long term (current) use of anticoagulants: Secondary | ICD-10-CM | POA: Diagnosis not present

## 2022-07-25 DIAGNOSIS — C3492 Malignant neoplasm of unspecified part of left bronchus or lung: Secondary | ICD-10-CM

## 2022-07-25 DIAGNOSIS — Z79899 Other long term (current) drug therapy: Secondary | ICD-10-CM | POA: Diagnosis not present

## 2022-07-25 DIAGNOSIS — Z86718 Personal history of other venous thrombosis and embolism: Secondary | ICD-10-CM | POA: Diagnosis not present

## 2022-07-25 LAB — CMP (CANCER CENTER ONLY)
ALT: 18 U/L (ref 0–44)
AST: 30 U/L (ref 15–41)
Albumin: 3.5 g/dL (ref 3.5–5.0)
Alkaline Phosphatase: 90 U/L (ref 38–126)
Anion gap: 5 (ref 5–15)
BUN: 26 mg/dL — ABNORMAL HIGH (ref 8–23)
CO2: 27 mmol/L (ref 22–32)
Calcium: 9 mg/dL (ref 8.9–10.3)
Chloride: 108 mmol/L (ref 98–111)
Creatinine: 1.34 mg/dL — ABNORMAL HIGH (ref 0.61–1.24)
GFR, Estimated: 53 mL/min — ABNORMAL LOW (ref >60)
Glucose, Bld: 95 mg/dL (ref 70–99)
Potassium: 4.9 mmol/L (ref 3.5–5.1)
Sodium: 140 mmol/L (ref 135–145)
Total Bilirubin: 1.6 mg/dL — ABNORMAL HIGH (ref 0.3–1.2)
Total Protein: 7.1 g/dL (ref 6.5–8.1)

## 2022-07-25 LAB — CBC WITH DIFFERENTIAL (CANCER CENTER ONLY)
Abs Immature Granulocytes: 0.05 10*3/uL (ref 0.00–0.07)
Basophils Absolute: 0.1 10*3/uL (ref 0.0–0.1)
Basophils Relative: 1 %
Eosinophils Absolute: 0.4 10*3/uL (ref 0.0–0.5)
Eosinophils Relative: 4 %
HCT: 28.2 % — ABNORMAL LOW (ref 39.0–52.0)
Hemoglobin: 9.2 g/dL — ABNORMAL LOW (ref 13.0–17.0)
Immature Granulocytes: 1 %
Lymphocytes Relative: 29 %
Lymphs Abs: 2.8 10*3/uL (ref 0.7–4.0)
MCH: 28.6 pg (ref 26.0–34.0)
MCHC: 32.6 g/dL (ref 30.0–36.0)
MCV: 87.6 fL (ref 80.0–100.0)
Monocytes Absolute: 1 10*3/uL (ref 0.1–1.0)
Monocytes Relative: 10 %
Neutro Abs: 5.5 10*3/uL (ref 1.7–7.7)
Neutrophils Relative %: 55 %
Platelet Count: 279 10*3/uL (ref 150–400)
RBC: 3.22 MIL/uL — ABNORMAL LOW (ref 4.22–5.81)
RDW: 15.9 % — ABNORMAL HIGH (ref 11.5–15.5)
WBC Count: 9.9 10*3/uL (ref 4.0–10.5)
nRBC: 0 % (ref 0.0–0.2)

## 2022-07-25 NOTE — Progress Notes (Signed)
[facility] Telephone:([phone]   Fax:(336) [phone]  OFFICE PROGRESS NOTE  Carney Living, MD [address] [place] [address]  DIAGNOSIS:  1) Stage IV (T1b, N3, M1 C) non-small cell lung cancer, adenocarcinoma with positive ALK gene translocation diagnosed in February 2023. 2) deep venous thrombosis of the left lower extremity  PRIOR THERAPY: None  CURRENT THERAPY:  1) Alecensa (Alectinib) 600 mg p.o. twice daily.  First dose April 11, 2021.  Status post 16 months of treatment. 2) Eliquis 2.5 mg p.o. twice daily.  First dose 10/11/2021.    INTERVAL HISTORY: Arthur [name] 82 y.o. male returns to the clinic today for follow-up visit accompanied by his son.  The patient is feeling fine today with no concerning complaints.  He denied having any significant fatigue or weakness.  He denied having any nausea, vomiting, diarrhea or constipation.  He has no chest pain, shortness of breath, cough or hemoptysis.  The swelling of the lower extremity is much better.  He has been tolerating his treatment with Alecensa (Alectinib) fairly well.  He is here for evaluation and repeat blood work.  MEDICAL HISTORY: Past Medical History:  Diagnosis Date   Diabetes mellitus without complication (HCC)    High cholesterol    Hypertension    lung ca    Normal nuclear stress test 01/29/2008   stress perfusion study apparently in 2010 in [facility] which he said was negative.    ALLERGIES:  is allergic to ace inhibitors and iohexol.  MEDICATIONS:  Current Outpatient Medications  Medication Sig Dispense Refill   benzonatate (TESSALON) 100 MG capsule TAKE 1 CAPSULE BY MOUTH TWICE DAILY AS NEEDED FOR COUGH 15 capsule 0   alectinib (ALECENSA) 150 MG capsule Take 4 capsules (600 mg total) by mouth 2 (two) times daily with a meal. 240 capsule 3   clopidogrel (PLAVIX) 75 MG tablet Take 1 tablet by mouth once daily with breakfast 90 tablet 0   ELIQUIS 2.5  MG TABS tablet Take 1 tablet by mouth twice daily 180 tablet 0   furosemide (LASIX) 20 MG tablet 1 tablet p.o. daily on as-needed basis for swelling of the lower extremities. (Patient taking differently: Take 20 mg by mouth daily as needed for edema.) 20 tablet 0   Iron-FA-B Cmp-C-Biot-Probiotic (FUSION PLUS) CAPS Take 1 capsule by mouth daily.     methylPREDNISolone (MEDROL DOSEPAK) 4 MG TBPK tablet Use as instructed. 21 tablet 0   omeprazole (PRILOSEC) 20 MG capsule Take 1 capsule (20 mg total) by mouth daily as needed. (Patient taking differently: Take 20 mg by mouth daily as needed (acid reflux).) 90 capsule 0   polyethylene glycol (MIRALAX / GLYCOLAX) 17 g packet Take 17 g by mouth daily as needed for moderate constipation.     rosuvastatin (CRESTOR) 5 MG tablet Take 1 tablet by mouth once daily 90 tablet 1   No current facility-administered medications for this visit.    SURGICAL HISTORY:  Past Surgical History:  Procedure Laterality Date   CATARACT EXTRACTION, BILATERAL  2006   CORONARY STENT INTERVENTION N/A 02/06/2021   Procedure: CORONARY STENT INTERVENTION;  Surgeon: Corky Crafts, MD;  Location: [facility] INVASIVE CV LAB;  Service: Cardiovascular;  Laterality: N/A;   CORONARY ULTRASOUND/IVUS N/A 02/06/2021   Procedure: Intravascular Ultrasound/IVUS;  Surgeon: Corky Crafts, MD;  Location: [facility] INVASIVE CV LAB;  Service: Cardiovascular;  Laterality: N/A;   FINE NEEDLE ASPIRATION  03/21/2021   Procedure: FINE  NEEDLE ASPIRATION;  Surgeon: Josephine Igo, DO;  Location: [facility]OS[facility];  Service: Pulmonary;;   IR RADIOLOGIST EVAL & MGMT  12/24/2021   KNEE SURGERY  2005   LEFT HEART CATH AND CORONARY ANGIOGRAPHY N/A 02/06/2021   Procedure: LEFT HEART CATH AND CORONARY ANGIOGRAPHY;  Surgeon: Corky Crafts, MD;  Location: [facility] INVASIVE CV LAB;  Service: Cardiovascular;  Laterality: N/A;   VIDEO BRONCHOSCOPY WITH ENDOBRONCHIAL ULTRASOUND Bilateral 03/21/2021   Procedure: VIDEO  BRONCHOSCOPY WITH ENDOBRONCHIAL ULTRASOUND;  Surgeon: Josephine Igo, DO;  Location: [facility]OS[facility];  Service: Pulmonary;  Laterality: Bilateral;    REVIEW OF SYSTEMS:  A comprehensive review of systems was negative.   PHYSICAL EXAMINATION: General appearance: alert, cooperative, and no distress Head: Normocephalic, without obvious abnormality, atraumatic Neck: no adenopathy, no JVD, supple, symmetrical, trachea midline, and thyroid not enlarged, symmetric, no tenderness/mass/nodules Lymph nodes: Cervical, supraclavicular, and axillary nodes normal. Resp: clear to auscultation bilaterally Back: symmetric, no curvature. ROM normal. No CVA tenderness. Cardio: regular rate and rhythm, S1, S2 normal, no murmur, click, rub or gallop GI: soft, non-tender; bowel sounds normal; no masses,  no organomegaly Extremities: edema trace edema in the left lower extremity  ECOG PERFORMANCE STATUS: 1 - Symptomatic but completely ambulatory  Blood pressure (!) 146/52, pulse 72, temperature (!) 97.5 F (36.4 C), temperature source Temporal, resp. rate 17, height 5\' 11"  (1.803 m), weight 162 lb 9.6 oz (73.8 kg), SpO2 100 %.  LABORATORY DATA: Lab Results  Component Value Date   WBC 9.9 07/25/2022   HGB 9.2 (L) 07/25/2022   HCT 28.2 (L) 07/25/2022   MCV 87.6 07/25/2022   PLT 279 07/25/2022      Chemistry      Component Value Date/Time   NA 140 07/25/2022 0837   NA 140 07/17/2021 1103   K 4.9 07/25/2022 0837   CL 108 07/25/2022 0837   CO2 27 07/25/2022 0837   BUN 26 (H) 07/25/2022 0837   BUN 21 10/09/2021 1147   CREATININE 1.34 (H) 07/25/2022 0837   CREATININE 1.18 11/15/2015 1114      Component Value Date/Time   CALCIUM 9.0 07/25/2022 0837   ALKPHOS 90 07/25/2022 0837   AST 30 07/25/2022 0837   ALT 18 07/25/2022 0837   BILITOT 1.6 (H) 07/25/2022 0837       RADIOGRAPHIC STUDIES: No results found.  ASSESSMENT AND PLAN: This is a very pleasant 82 years old [place] male diagnosed with  Stage IV (T1b, N3, M1 C) non-small cell lung cancer, adenocarcinoma with positive ALK gene translocation in February 2023. The patient is currently undergoing treatment with Alecensa (Alectinib) 600 mg p.o. twice daily.  He has been tolerating the treatment well with no concerning complaints.  He is status post 16 months of treatment.   The patient has been tolerating this treatment fairly well with no concerning adverse effects. I recommended for him to continue his current treatment with Alecensa (Alectinib) with the same dose. I will see him back for follow-up visit in 2 months for evaluation and repeat blood work as well as imaging studies. For the anemia, likely combination of anemia of chronic disease, iron deficiency and hemolytic anemia.  He will continue his current treatment with fusion plus and I advised him to also take multivitamins and B12 supplement orally. For the history of deep venous thrombosis, he will continue his current treatment with Eliquis 2.5 mg p.o. twice daily. The patient was advised to call immediately if he has any other concerning symptoms  in the interval. All questions were answered. The patient knows to call the clinic with any problems, questions or concerns. We can certainly see the patient much sooner if necessary.  The total time spent in the appointment was 20 minutes.  Disclaimer: This note was dictated with voice recognition software. Similar sounding words can inadvertently be transcribed and may not be corrected upon review.

## 2022-07-30 ENCOUNTER — Other Ambulatory Visit: Payer: Self-pay

## 2022-07-30 ENCOUNTER — Encounter: Payer: Self-pay | Admitting: Family Medicine

## 2022-07-30 ENCOUNTER — Ambulatory Visit (INDEPENDENT_AMBULATORY_CARE_PROVIDER_SITE_OTHER): Payer: 59 | Admitting: Family Medicine

## 2022-07-30 VITALS — BP 132/53 | HR 68 | Ht 71.0 in | Wt 160.8 lb

## 2022-07-30 DIAGNOSIS — R109 Unspecified abdominal pain: Secondary | ICD-10-CM | POA: Diagnosis not present

## 2022-07-30 DIAGNOSIS — M21372 Foot drop, left foot: Secondary | ICD-10-CM | POA: Diagnosis not present

## 2022-07-30 DIAGNOSIS — E1169 Type 2 diabetes mellitus with other specified complication: Secondary | ICD-10-CM | POA: Diagnosis not present

## 2022-07-30 DIAGNOSIS — I10 Essential (primary) hypertension: Secondary | ICD-10-CM | POA: Diagnosis not present

## 2022-07-30 LAB — POCT GLYCOSYLATED HEMOGLOBIN (HGB A1C): HbA1c, POC (controlled diabetic range): 4.9 % (ref 0.0–7.0)

## 2022-07-30 NOTE — Patient Instructions (Addendum)
Good to see you today - Thank you for coming in  Things we discussed today:  L side abdomen pain Take miralax once today and if not better twice a day tomorrow until have regular bowel movement If getting worse on not better in 2 days then call me  Use voltaren cream twice a day    Hearing Murine Ear wax drops and syringe - use the drops twice a day for about a week then wash out with warm water   Check your medicines   Please always bring your medication bottles  Come back to see me in 3 months

## 2022-07-30 NOTE — Assessment & Plan Note (Signed)
Well controlled with diet. 

## 2022-07-30 NOTE — Assessment & Plan Note (Signed)
Well controlled off all medications  

## 2022-07-30 NOTE — Assessment & Plan Note (Addendum)
Persistent.  As best can tell due to bleed that caused nerve compression.   Doubt will have much recovery.  He seems to be functioning well

## 2022-07-30 NOTE — Progress Notes (Signed)
    SUBJECTIVE:   CHIEF COMPLAINT / HPI:    R flank pain For the last 2 days.  No trauma or any unusual activity, Pain worse with twisting  No vomiting.  No bowel movement for last two days.  Took miralax once yesterday.  No pain over his inquinal hernia.  No fever or hematuria.    Diabetes No medications, vegetarian diet,  Watches what he eats. Regular exercise  Lipids Taking crestor once a day    Anemia Per heme onc "likely combination of anemia of chronic disease, iron deficiency and hemolytic anemia. He will continue his current treatment with fusion plus and I advised him to also take multivitamins and B12 supplement orally."   Foot Drop Walking around home without cane or brace.  Finished Physical Therapy.  Continues to have significant foot drop.  He does not believe this is improving.  Does exercises daily     OBJECTIVE:   BP (!) 132/53   Pulse 68   Ht 5\' 11"  (1.803 m)   Wt 160 lb 12.8 oz (72.9 kg)   SpO2 97%   BMI 22.43 kg/m   No results found. Lungs:  Normal respiratory effort, chest expands symmetrically. Lungs are clear to auscultation, no crackles or wheezes. R Flank - tender over lower ribs and upper R abdomen muscles. No abdomen tenderness or guarding or rebound.  Inguinal hernia is not tender No CVAT No spinal tenderness  Normal skin no lesions   ASSESSMENT/PLAN:   Type 2 diabetes mellitus with other specified complication, without long-term current use of insulin (HCC) Assessment & Plan: Well controlled with diet  Orders: -     POCT glycosylated hemoglobin (Hb A1C)  Essential hypertension, benign Assessment & Plan: Well controlled off all medications    Foot drop, left foot Assessment & Plan: Persistent.  As best can tell due to bleed that caused nerve compression.   Doubt will have much recovery.  He seems to be functioning well    Right flank pain Assessment & Plan: Seems to be related to musculoskeletal cause and perhaps  constipation.  Not consistent with rib mets or PE or pneumonia or renal stone or abdominal obstruction.  Trial of miralax and topical nsaid and follow closely       Patient Instructions  Good to see you today - Thank you for coming in  Things we discussed today:  L side abdomen pain Take miralax once today and if not better twice a day tomorrow until have regular bowel movement If getting worse on not better in 2 days then call me  Use voltaren cream twice a day    Hearing Murine Ear wax drops and syringe - use the drops twice a day for about a week then wash out with warm water   Check your medicines   Please always bring your medication bottles  Come back to see me in 3 months    Carney Living, MD [facility]

## 2022-07-30 NOTE — Assessment & Plan Note (Signed)
Seems to be related to musculoskeletal cause and perhaps constipation.  Not consistent with rib mets or PE or pneumonia or renal stone or abdominal obstruction.  Trial of miralax and topical nsaid and follow closely

## 2022-07-31 ENCOUNTER — Other Ambulatory Visit (HOSPITAL_COMMUNITY): Payer: Self-pay

## 2022-07-31 ENCOUNTER — Other Ambulatory Visit: Payer: Self-pay | Admitting: Internal Medicine

## 2022-07-31 ENCOUNTER — Other Ambulatory Visit: Payer: Self-pay

## 2022-07-31 MED ORDER — ALECENSA 150 MG PO CAPS
600.0000 mg | ORAL_CAPSULE | Freq: Two times a day (BID) | ORAL | 3 refills | Status: DC
  Filled 2022-07-31: qty 240, 30d supply, fill #0
  Filled 2022-09-06: qty 240, 30d supply, fill #1
  Filled 2022-10-02: qty 240, 30d supply, fill #2
  Filled 2022-11-01: qty 240, 30d supply, fill #3

## 2022-08-02 ENCOUNTER — Other Ambulatory Visit (HOSPITAL_COMMUNITY): Payer: Self-pay

## 2022-08-05 ENCOUNTER — Ambulatory Visit (INDEPENDENT_AMBULATORY_CARE_PROVIDER_SITE_OTHER): Payer: 59 | Admitting: Student

## 2022-08-05 ENCOUNTER — Other Ambulatory Visit: Payer: Self-pay

## 2022-08-05 ENCOUNTER — Other Ambulatory Visit (HOSPITAL_COMMUNITY): Payer: Self-pay

## 2022-08-05 ENCOUNTER — Encounter: Payer: Self-pay | Admitting: Family Medicine

## 2022-08-05 VITALS — BP 127/45 | HR 84

## 2022-08-05 DIAGNOSIS — R531 Weakness: Secondary | ICD-10-CM | POA: Diagnosis not present

## 2022-08-05 DIAGNOSIS — K5900 Constipation, unspecified: Secondary | ICD-10-CM | POA: Diagnosis not present

## 2022-08-05 DIAGNOSIS — M545 Low back pain, unspecified: Secondary | ICD-10-CM | POA: Diagnosis not present

## 2022-08-05 MED ORDER — BACLOFEN 10 MG PO TABS: 10.0000 mg | ORAL_TABLET | Freq: Every day | ORAL | 0 refills | Status: DC

## 2022-08-05 NOTE — Patient Instructions (Addendum)
It was great to see you! Thank you for allowing me to participate in your care!  I recommend that you always bring your medications to each appointment as this makes it easy to ensure we are on the correct medications and helps [place] not miss when refills are needed.  Our plans for today:  - Back Pain Looks to be a muscle that may be tight or irritated/inflamed. We will give you a muscle relaxer to help out.   Baclofen 10 mg, daily at night.  Don't drive or operate machines/equipment/tools while on medicine. Avoid important decisions as medicine is sedating Back X-Ray  If you decide, go get back X-ray to see if there is anything causing this pain  [facility]   [address] [place]    Phone: [phone]  -Weakness This is likely coming from you not eating. I recommend you get two balanced meals a day, at least, with protein and carbohydrates. Eat smaller meals if you don't have an appetite. This should improve your activity tolerance. If not getting better, make follow up.  Constipation Take 1 cap, or 17 g of Miralax in the AM and PM. This should help loosen/smoothen stools after 3 days.  IF bowel movements not getting better, you should take 34 g in the am and 17 g in the pm. Can decrease dose of miralax to 17 g twice a day, or 17 g daily, once you are having runny/watery stools. Ideally want to keep at dose of when having smooth stools.   Take care and seek immediate care sooner if you develop any concerns.   Dr. Bess Kinds, MD [facility]

## 2022-08-05 NOTE — Progress Notes (Unsigned)
  SUBJECTIVE:   CHIEF COMPLAINT / HPI:   Sick / feeling weak Feeling sick, tired, weak, cough, loss of appetite. Note's this started Saturday. IS having weakness with activity, get's tired fast. Note's he's not been eating much recently, doesn't have taste for food.   Back Pain Last week began hurting. Saw for it last week, tried voltaren and tylenol, w/o relief. Has the back pain while sitting. Notes pain when stretching back, and back feels tight.   Bowel movements Having hard bowel movements and difficulty pushing out.    PERTINENT  PMH / PSH:   Past Medical History:  Diagnosis Date   Diabetes mellitus without complication (HCC)    High cholesterol    Hypertension    lung ca    Normal nuclear stress test 01/29/2008   stress perfusion study apparently in 2010 in [facility] which he said was negative.    Patient Care Team: Carney Living, MD as PCP - General (Family Medicine) Jodelle Red, MD as PCP - Cardiology (Cardiology) Rollene Rotunda, MD (Cardiology) Nelson Chimes, MD (Ophthalmology) OBJECTIVE:  BP (!) 127/45   Pulse 84   SpO2 97%  Physical Exam   ASSESSMENT/PLAN:  There are no diagnoses linked to this encounter. No follow-ups on file. Bess Kinds, MD 08/05/2022, 10:59 AM PGY-***, [facility] {    This will disappear when note is signed, click to select method of visit    :1}

## 2022-08-07 DIAGNOSIS — K5900 Constipation, unspecified: Secondary | ICD-10-CM | POA: Insufficient documentation

## 2022-08-07 DIAGNOSIS — M545 Low back pain, unspecified: Secondary | ICD-10-CM | POA: Insufficient documentation

## 2022-08-07 NOTE — Assessment & Plan Note (Signed)
Patient notes back pain began hurting last week, where they were told to try using voltaren gel and tylenol. But patient had no relief. Patient notes pain and tightness when bending to side, and that pain brought on by sitting in chair. Patient muscles in right lower lumbar region TTP, suggesting muscular nature. I believe patient is suffering from muscle spasm. Less likely OA given muscular tenderness, but will obtain x-ray. -DG Lumbar -Baclofen 15 mg

## 2022-08-07 NOTE — Assessment & Plan Note (Signed)
Patient notes feeling weak for last weak. He has loss his taste for food and has not been eating much. He denies any fever, malaise, myalgia, and is peeing like normal. He complains of fatiguing quickly, when asked about feeling week. He get's up to move and get's tired quickly. He note's some constipation recently. Patient had normal neuro exam. I believe patient is suffering from some malnutrition. As he's not been eating much, his exercise tolerance will be much less. Can't exclude malignancy, or anemia, or electrolyte issues, so will obtain labs. Encouraged patient to eat small frequent meals. -CBC, BMP -More frequent meals

## 2022-08-07 NOTE — Assessment & Plan Note (Signed)
Patient notes having hard bowel movements that are difficult to push out. Patient has been taking miralax but is getting no relief. Instructed patient to take full cap, 17 g, and increase to BID if needed.  -Miralax 17 g dailly, titrate to 1 soft stool daily

## 2022-08-08 ENCOUNTER — Ambulatory Visit: Payer: 59 | Admitting: Student

## 2022-08-08 VITALS — Ht 71.0 in

## 2022-08-08 DIAGNOSIS — K5900 Constipation, unspecified: Secondary | ICD-10-CM | POA: Diagnosis not present

## 2022-08-08 DIAGNOSIS — R531 Weakness: Secondary | ICD-10-CM

## 2022-08-08 MED ORDER — SENNA 8.6 MG PO TABS
1.0000 | ORAL_TABLET | Freq: Every day | ORAL | 0 refills | Status: DC
Start: 2022-08-08 — End: 2023-06-05

## 2022-08-08 NOTE — Progress Notes (Signed)
  SUBJECTIVE:   CHIEF COMPLAINT / HPI:   Presents today with continued weakness and poor appetite.  He does note that his right flank pain has resolved.  Further endorses constipation although is having appropriate stool and flatulence, he is having hard bowel movements.  Notes bowel movements are dark but not bloody. He is taking MiraLAX twice daily.  PERTINENT  PMH / PSH: Stage IV adenocarcinoma of lung, T2DM, CAD, HLD, HTN  Patient Care Team: Carney Living, MD as PCP - General (Family Medicine) Jodelle Red, MD as PCP - Cardiology (Cardiology) Rollene Rotunda, MD (Cardiology) Nelson Chimes, MD (Ophthalmology) OBJECTIVE:  Ht 5\' 11"  (1.803 m)   BMI 22.43 kg/m  Physical Exam Constitutional:      Appearance: Normal appearance.  Cardiovascular:     Rate and Rhythm: Normal rate and regular rhythm.     Heart sounds: Normal heart sounds.  Pulmonary:     Breath sounds: Normal breath sounds.  Abdominal:     General: Abdomen is flat. Bowel sounds are normal.     Palpations: Abdomen is soft.     Tenderness: There is abdominal tenderness (Generalized).  Neurological:     Mental Status: He is alert.    ASSESSMENT/PLAN:  Weakness Assessment & Plan: Weakness/fatigue continue, seen several days prior.  No frank bleeding appreciated although he does have history of anemia and is on iron supplementation for this.  Unfortunately labs are unable to be collected at last visit, we will collect those today for consideration of anemia and electrolyte abnormality.  I have also recommended boost/Ensure shakes to assist with nutrition.  Orders: -     CBC -     Basic metabolic panel  Constipation, unspecified constipation type Assessment & Plan: Increase bowel regimen to MiraLAX twice daily, senna daily.  He is not necessarily having constipation but having change in his bowel habits likely related to malnutrition/poor intake.  Orders: -     Senna; Take 1 tablet (8.6 mg total)  by mouth daily.  Dispense: 120 tablet; Refill: 0  Shelby Mattocks, DO 08/09/2022, 9:33 AM PGY-3, [facility]

## 2022-08-08 NOTE — Patient Instructions (Addendum)
It was great to see you today! Thank you for choosing [facility] for your primary care. Arthur [name] was seen for weakness.  Today we addressed: Will get labs today for your weakness.  I would recommend starting the Senokot to help with the constipation.  Continue taking MiraLAX twice a day.  I also recommend starting a boost or Ensure shake for nutritional purposes.  If you haven't already, sign up for My Chart to have easy access to your labs results, and communication with your primary care physician.  We are checking some labs today. If they are abnormal, I will call you. If they are normal, I will send you a MyChart message (if it is active) or a letter in the mail. If you do not hear about your labs in the next 2 weeks, please call the office.  Please arrive 15 minutes before your appointment to ensure smooth check in process.  We appreciate your efforts in making this happen.  Thank you for allowing me to participate in your care, Shelby Mattocks, DO 08/08/2022, 4:03 PM PGY-3, [facility]

## 2022-08-08 NOTE — Assessment & Plan Note (Signed)
Increase bowel regimen to MiraLAX twice daily, senna daily.  He is not necessarily having constipation but having change in his bowel habits likely related to malnutrition/poor intake.

## 2022-08-09 ENCOUNTER — Telehealth: Payer: Self-pay | Admitting: Student

## 2022-08-09 ENCOUNTER — Observation Stay (HOSPITAL_COMMUNITY)
Admission: AD | Admit: 2022-08-09 | Discharge: 2022-08-10 | Disposition: A | Discharge status: Home / Self Care | Payer: 59 | Source: Ambulatory Visit | Attending: Family Medicine | Admitting: Family Medicine

## 2022-08-09 ENCOUNTER — Encounter (HOSPITAL_COMMUNITY): Payer: Self-pay | Admitting: Student

## 2022-08-09 ENCOUNTER — Encounter (HOSPITAL_COMMUNITY): Payer: Self-pay

## 2022-08-09 ENCOUNTER — Other Ambulatory Visit: Payer: Self-pay

## 2022-08-09 DIAGNOSIS — I2510 Atherosclerotic heart disease of native coronary artery without angina pectoris: Secondary | ICD-10-CM | POA: Diagnosis not present

## 2022-08-09 DIAGNOSIS — R5383 Other fatigue: Secondary | ICD-10-CM | POA: Diagnosis present

## 2022-08-09 DIAGNOSIS — E119 Type 2 diabetes mellitus without complications: Secondary | ICD-10-CM | POA: Diagnosis not present

## 2022-08-09 DIAGNOSIS — Z79899 Other long term (current) drug therapy: Secondary | ICD-10-CM | POA: Insufficient documentation

## 2022-08-09 DIAGNOSIS — Z86711 Personal history of pulmonary embolism: Secondary | ICD-10-CM | POA: Diagnosis not present

## 2022-08-09 DIAGNOSIS — Z85118 Personal history of other malignant neoplasm of bronchus and lung: Secondary | ICD-10-CM | POA: Insufficient documentation

## 2022-08-09 DIAGNOSIS — Z7902 Long term (current) use of antithrombotics/antiplatelets: Secondary | ICD-10-CM | POA: Insufficient documentation

## 2022-08-09 DIAGNOSIS — C3490 Malignant neoplasm of unspecified part of unspecified bronchus or lung: Secondary | ICD-10-CM | POA: Diagnosis present

## 2022-08-09 DIAGNOSIS — Z8673 Personal history of transient ischemic attack (TIA), and cerebral infarction without residual deficits: Secondary | ICD-10-CM | POA: Insufficient documentation

## 2022-08-09 DIAGNOSIS — C3491 Malignant neoplasm of unspecified part of right bronchus or lung: Secondary | ICD-10-CM | POA: Diagnosis present

## 2022-08-09 DIAGNOSIS — Z86718 Personal history of other venous thrombosis and embolism: Secondary | ICD-10-CM | POA: Insufficient documentation

## 2022-08-09 DIAGNOSIS — Z955 Presence of coronary angioplasty implant and graft: Secondary | ICD-10-CM | POA: Diagnosis not present

## 2022-08-09 DIAGNOSIS — Z7901 Long term (current) use of anticoagulants: Secondary | ICD-10-CM | POA: Insufficient documentation

## 2022-08-09 DIAGNOSIS — D649 Anemia, unspecified: Principal | ICD-10-CM | POA: Diagnosis present

## 2022-08-09 LAB — CBC WITH DIFFERENTIAL/PLATELET
Abs Immature Granulocytes: 0.28 10*3/uL — ABNORMAL HIGH (ref 0.00–0.07)
Basophils Absolute: 0.1 10*3/uL (ref 0.0–0.1)
Basophils Relative: 1 %
Eosinophils Absolute: 0.3 10*3/uL (ref 0.0–0.5)
Eosinophils Relative: 2 %
HCT: 19.1 % — ABNORMAL LOW (ref 39.0–52.0)
Hemoglobin: 6.1 g/dL — CL (ref 13.0–17.0)
Immature Granulocytes: 2 %
Lymphocytes Relative: 21 %
Lymphs Abs: 2.5 10*3/uL (ref 0.7–4.0)
MCH: 29.2 pg (ref 26.0–34.0)
MCHC: 31.9 g/dL (ref 30.0–36.0)
MCV: 91.4 fL (ref 80.0–100.0)
Monocytes Absolute: 1.4 10*3/uL — ABNORMAL HIGH (ref 0.1–1.0)
Monocytes Relative: 12 %
Neutro Abs: 7.1 10*3/uL (ref 1.7–7.7)
Neutrophils Relative %: 62 %
Platelets: 352 10*3/uL (ref 150–400)
RBC: 2.09 MIL/uL — ABNORMAL LOW (ref 4.22–5.81)
RDW: 19.8 % — ABNORMAL HIGH (ref 11.5–15.5)
WBC: 11.6 10*3/uL — ABNORMAL HIGH (ref 4.0–10.5)
nRBC: 2.6 % — ABNORMAL HIGH (ref 0.0–0.2)

## 2022-08-09 LAB — BASIC METABOLIC PANEL
BUN/Creatinine Ratio: 18 (ref 10–24)
BUN: 26 mg/dL (ref 8–27)
CO2: 21 mmol/L (ref 20–29)
Calcium: 8.8 mg/dL (ref 8.6–10.2)
Chloride: 106 mmol/L (ref 96–106)
Creatinine, Ser: 1.48 mg/dL — ABNORMAL HIGH (ref 0.76–1.27)
Glucose: 158 mg/dL — ABNORMAL HIGH (ref 70–99)
Potassium: 4.3 mmol/L (ref 3.5–5.2)
Sodium: 142 mmol/L (ref 134–144)
eGFR: 47 mL/min/{1.73_m2} — ABNORMAL LOW (ref >59)

## 2022-08-09 LAB — COMPREHENSIVE METABOLIC PANEL
ALT: 22 U/L (ref 0–44)
AST: 37 U/L (ref 15–41)
Albumin: 3 g/dL — ABNORMAL LOW (ref 3.5–5.0)
Alkaline Phosphatase: 76 U/L (ref 38–126)
Anion gap: 10 (ref 5–15)
BUN: 21 mg/dL (ref 8–23)
CO2: 22 mmol/L (ref 22–32)
Calcium: 8.2 mg/dL — ABNORMAL LOW (ref 8.9–10.3)
Chloride: 105 mmol/L (ref 98–111)
Creatinine, Ser: 1.43 mg/dL — ABNORMAL HIGH (ref 0.61–1.24)
GFR, Estimated: 49 mL/min — ABNORMAL LOW (ref >60)
Glucose, Bld: 157 mg/dL — ABNORMAL HIGH (ref 70–99)
Potassium: 3.8 mmol/L (ref 3.5–5.1)
Sodium: 137 mmol/L (ref 135–145)
Total Bilirubin: 1.5 mg/dL — ABNORMAL HIGH (ref 0.3–1.2)
Total Protein: 5.9 g/dL — ABNORMAL LOW (ref 6.5–8.1)

## 2022-08-09 LAB — RETICULOCYTES
Immature Retic Fract: 28 % — ABNORMAL HIGH (ref 2.3–15.9)
RBC.: 2.09 MIL/uL — ABNORMAL LOW (ref 4.22–5.81)
Retic Count, Absolute: 175.1 10*3/uL (ref 19.0–186.0)
Retic Ct Pct: 8.4 % — ABNORMAL HIGH (ref 0.4–3.1)

## 2022-08-09 LAB — TYPE AND SCREEN
ABO/RH(D): B NEG
Antibody Screen: NEGATIVE
Unit division: 0
Unit division: 0

## 2022-08-09 LAB — BPAM RBC
Blood Product Expiration Date: 202408132359
Blood Product Expiration Date: 202408152359
Unit Type and Rh: 1700
Unit Type and Rh: 1700

## 2022-08-09 LAB — APTT: aPTT: 32 seconds (ref 24–36)

## 2022-08-09 LAB — CBC
Hematocrit: 20.3 % — ABNORMAL LOW (ref 37.5–51.0)
Hemoglobin: 6.5 g/dL — CL (ref 13.0–17.7)
MCH: 28.8 pg (ref 26.6–33.0)
MCHC: 32 g/dL (ref 31.5–35.7)
MCV: 90 fL (ref 79–97)
NRBC: 2 % — ABNORMAL HIGH (ref 0–0)
Platelets: 347 10*3/uL (ref 150–450)
RBC: 2.26 x10E6/uL — CL (ref 4.14–5.80)
RDW: 17 % — ABNORMAL HIGH (ref 11.6–15.4)
WBC: 11.8 10*3/uL — ABNORMAL HIGH (ref 3.4–10.8)

## 2022-08-09 LAB — PREPARE RBC (CROSSMATCH)

## 2022-08-09 LAB — LACTATE DEHYDROGENASE: LDH: 196 U/L — ABNORMAL HIGH (ref 98–192)

## 2022-08-09 LAB — PROTIME-INR
INR: 1.2 (ref 0.8–1.2)
Prothrombin Time: 15.7 seconds — ABNORMAL HIGH (ref 11.4–15.2)

## 2022-08-09 MED ORDER — SODIUM CHLORIDE 0.9% IV SOLUTION: Freq: Once | INTRAVENOUS | Status: AC

## 2022-08-09 MED ORDER — ROSUVASTATIN CALCIUM 5 MG PO TABS
5.0000 mg | ORAL_TABLET | Freq: Every day | ORAL | Status: DC
  Administered 2022-08-10: 5 mg via ORAL
  Filled 2022-08-09: qty 1

## 2022-08-09 MED ORDER — ROSUVASTATIN CALCIUM 5 MG PO TABS: 5.0000 mg | ORAL_TABLET | Freq: Every day | ORAL | Status: DC

## 2022-08-09 MED ORDER — CLOPIDOGREL BISULFATE 75 MG PO TABS
75.0000 mg | ORAL_TABLET | Freq: Every day | ORAL | Status: DC
  Administered 2022-08-10: 75 mg via ORAL
  Filled 2022-08-09: qty 1

## 2022-08-09 MED ORDER — ALECTINIB HCL 150 MG PO CAPS
600.0000 mg | ORAL_CAPSULE | Freq: Two times a day (BID) | ORAL | Status: DC
  Administered 2022-08-09 – 2022-08-10 (×2): 600 mg via ORAL
  Filled 2022-08-09 (×3): qty 4

## 2022-08-09 NOTE — Assessment & Plan Note (Addendum)
Managed by Dr. Arbutus Ped, really doing quite well on Alectinib. - Stable.  - Continue Alectinib for now. If hemolysis panel is highly suspicious for hemolytic process, would favor discontinuing.

## 2022-08-09 NOTE — Telephone Encounter (Addendum)
Spoke with daughter in law He is feeling well except tired No chest pain or shortness of breath or signs of bleeding  They would prefer outpt transfusion I asked them to stop eliquis and that I would be in touch  Spoke with [facility] transfusion center -  [phone]. earliest could do would be Monday  Spoke with bed control They will contact daughter for direct admit   Spoke with daughter about admission  Notified inpt team   Cancel transfusion for Monday at [facility] transfusion center

## 2022-08-09 NOTE — Assessment & Plan Note (Signed)
Managed by Dr. Arbutus Ped, really doing quite well on Alectinib. - Stable - Continue Alectinib for now. If hemolysis panel is highly suspicious for hemolytic process, would favor discontinuing.

## 2022-08-09 NOTE — Hospital Course (Addendum)
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   His hospital course is detailed below:  Symptomatic anemia Presented with fatigue and was found to have anemia with hemoglobin to 6.5 on outpatient labs.  Did not have any apparent bleeding.  He was on Eliquis 2.5 Mg twice daily for history of DVT and PE, though this was held during admission.  Patient received 2U PRBCs and hemoglobin elevated to 8.8.  Restarted Eliquis prior to discharge.  S/p coronary artery stent placement Was continued on Plavix and Crestor during hospitalization.  Adenocarcinoma of right lung, stage IV Patient is managed by Dr. Shirline Frees, and doing well on Alectinib.  This was continued during hospitalization.  Hemolysis panel is still in process at time of discharge.  Other chronic conditions were medically managed with home medications and formulary alternatives as necessary ()  PCP Follow-up Recommendations: Follow-up with Dr. Shirline Frees outpatient

## 2022-08-09 NOTE — Assessment & Plan Note (Addendum)
-   Continue Plavix - Continue Crestor  - Transfusion threshold 8

## 2022-08-09 NOTE — Telephone Encounter (Signed)
Called to inform patient that his blood work shows that his Hemoglobin in 6.5, when it was 9.2, 2 weeks ago. I am concerned that the patient is bleeding. If the patient and family are willing, I would like the patient to come in and be worked up for this. And to receive a transfusion.   If patient having, chest pain, shortness of breath, intense fatigue, he should go to the ED right away, or call EMS for transport.   If patient feeling okay, and wanting to be admitted, they can let RN know, and they will message me to let me know patient wanting to be admitted. I will call bed control, and start the process. Patient will likely be directly admitted to the hospital, from home later this evening.   If patient does not want to be admitted, they should be scheduled for follow up early next week. However it is this provider's recommendation that patient is admitted. This is urgent, and could quickly become an emergency.   Provider called patient cell, home, and son cell phones and left message requesting call back,as this was urgent.

## 2022-08-09 NOTE — Assessment & Plan Note (Addendum)
Hgb 6.5 on outpatient check 7/11, symptomatic with generalized fatigue. No overt bleeding was apparent. Has been on Eliquis 2.5mg  BID for history of DVT and PE. Also has an IVC filter in place. Per note from two weeks ago, Dr. Arbutus Ped believed his anemia was likely a combination of anemia of chronic disease, iron deficiency, and hemolytic anemia. He is at increased risk for hemolytic anemia with his alectinib therapy. This would be less likely given he's been on this for >1 year. Of course cannot rule out acute bleed given acute drop in Hgb over the past two weeks or so.  Patient received 2U PRBCs.  Post H/H** - Hold Eliquis  - Transfuse to >8 given cardiac history - Repeat CBC in the morning  - CMP, protime-INR - Will touch base with Dr. Arbutus Ped as to whether he would like more testing and whether he believes alectinib may be contributing to a potential hemolytic anemia

## 2022-08-09 NOTE — H&P (Signed)
Hospital Admission History and Physical Service Pager: [phone]  Patient name: Arthur [name] Medical record number: [id number] Date of Birth: January 26, 1941 Age: 82 y.o. Gender: male  Primary Care Provider: Carney Living, MD Consultants: None Code Status: Full Preferred Emergency Contact:  Contact Information     Name Relation Home Work Mobile   Meadow,Vipul Son [phone]  [phone]      Other Contacts     Name Relation Home Work Mobile   Landowski,[name] Daughter [phone]  [phone]   Woolsey,Shital Daughter   [phone]        Chief Complaint: Fatigue  Assessment and Plan: Arthur [name] is a 82 y.o. male presenting with fatigue, found to have anemia with hemoglobin to 6.5 on outpatient labs. Differential for presentation of this includes hemolytic anemia, GI bleed, versus malignancy associated marrow failure. Marrow failure seems less likely given other cell lines (WBC and platelets) are not suppressed. He is not having any over bleeding and, if anything, is constipated, making GIB less likely. We will admit for workup and potential transfusion.     [facility]     * (Principal) Symptomatic anemia     Hgb 6.5 on outpatient check yesterday, symptomatic with generalized  fatigue. Curiously feeling much better today. No overt bleeding. BUN not  elevated above his baseline of 26. Has been on Eliquis 2.5mg  BID for  history of DVT and PE. Also has an  IVC filter in place. Per note from two  weeks ago, Dr. Arbutus Ped believed his anemia was likely a combination of  anemia of chronic diseaes, iron deficiency, and hemolytic anemia. He is at  increased risk for hemolytic anemia with his alectinib therapy. This would  be less likely given he's been on this for >1 year. Of course cannot rule  out acute bleed given acute drop in Hgb over the past two weeks or so.  - Obs status, med-surg unit - Hold Eliquis  - Repeat CBC - Transfuse to >8 given cardiac  history - Repeat CBC in the morning  - Retic, LDH, Haptoglobin, bilirubin - Will touch base with Dr. Arbutus Ped as to whether he would like more  testing and whether he believes alectinib may be contributing to a  potential hemolytic anemia  - Two large bore IV         Status post coronary artery stent placement     - Continue Plavix - Continue Crestor  - Transfusion threshold 8        Adenocarcinoma of right lung, stage 4 (HCC)     Managed by Dr. Arbutus Ped, really doing quite well on Alectinib. - Stable.  - Continue Alectinib for now. If hemolysis panel is highly suspicious for  hemolytic process, would favor discontinuing.        FEN/GI: vegetarian diet  VTE Prophylaxis: SCDs  Disposition: Med-Surg  History of Present Illness:  Arthur [name] is a 82 y.o. male presenting with fatigue, found to have a Hgb of 6.5 as an outpatient.  His past medical history is significant for stage IV adenocarcinoma of the lungs, CAD status post coronary artery stenting, history of cardiac arrest, history of DVTs and PEs with an IVC filter in place, Stroke  He reports that he has been feeling unwell since Saturday.  He is having some generalized fatigue and exercise intolerance.  Notes he was getting much more tired than he usually does with his day-to-day activities.  Also thinks he has  had a little bit of a decreased appetite, though review of notes shows that this is a chronic issue.  He denies any fever, body aches, chills. No one has noticed yellowing of the skin or eyes.   He was seen for this by Dr. Barbaraann Faster in clinic on Monday.  Unfortunately, labs were not able to be collected at that visit.  He returned to clinic yesterday and saw Dr. Royal Piedra who was able to collect labs.  His hemoglobin resulted at 6.5, MCV 90.  The remainder of his labs are significant for a WBC of 11.8 and platelets of 347.  His PCP, Dr. Deirdre Priest, was able to get him set up for an outpatient infusion and foot admission.   However, given that we are going into the weekend, this was not possible, so he was directly admitted to the hospital for transfusions and further workup.  On arrival today, he actually tells me he is feeling a bit better.   Review Of Systems: Per HPI with the following additions:  Review of Systems  Constitutional:  Negative for chills and fever.  Gastrointestinal:  Positive for constipation. Negative for blood in stool.  Skin:  Negative for rash.  Endo/Heme/Allergies:  Does not bruise/bleed easily.     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   Pertinent Past Surgical History: Stenting in Jan 2023  Remainder reviewed in history tab.   Pertinent Social History: Tobacco use: No Alcohol use: No Other Substance use: No Lives with Wife and son   Pertinent Family History: CAD in both parents, father died of MI in his 14s  Remainder reviewed in history tab.   Important Outpatient Medications: Alectinib Eliquis Plavix PRN Lasix  Remainder reviewed in medication history.   Objective: There were no vitals taken for this visit. Exam: General: Elderly gentleman, appears younger than stated age, talkative and NAD Eyes: No scleral icterus, there is conjunctival pallor ENTM: Mucous membranes are moist Neck: Supple and without LAD  Cardiovascular: 1/6 systolic murmur  Respiratory: Normal WOB on RA, speaking in full sentences  Gastrointestinal: Soft, non-tender, non-distended MSK: Without edema or deformity  Derm: Without rash or excoriation Neuro: At baseline, chronic left footdrop. Otherwise 5/5 strength in all extremities  Psych: In good spirits   Labs:  CBC BMET  Recent Labs  Lab 08/08/22 1632  WBC 11.8*  HGB 6.5*  HCT 20.3*  PLT 347   Recent Labs  Lab 08/08/22 1632  NA 142  K 4.3  CL 106  CO2 21  BUN 26  CREATININE 1.48*   GLUCOSE 158*  CALCIUM 8.8     EKG: Not collected    Imaging Studies Performed: No imaging studies performed    Alicia Amel, MD 08/09/2022, 4:24 PM PGY-3, [facility]  FPTS Intern pager: [phone], text pages welcome Secure chat group [facility] Teaching Service

## 2022-08-09 NOTE — Assessment & Plan Note (Addendum)
Weakness/fatigue continue, seen several days prior.  No frank bleeding appreciated although he does have history of anemia and is on iron supplementation for this.  Unfortunately labs are unable to be collected at last visit, we will collect those today for consideration of anemia and electrolyte abnormality.  I have also recommended boost/Ensure shakes to assist with nutrition.

## 2022-08-09 NOTE — Progress Notes (Incomplete)
Daily Progress Note Intern Pager: [phone]  Patient name: Arthur [name] Medical record number: [id number] Date of birth: Sep 22, 1940 Age: 82 y.o. Gender: male  Primary Care Provider: Carney Living, MD Consultants: None Code Status: Full  Pt Overview and Major Events to Date:  7/12 - received 2U PRBCs  Assessment and Plan: Arthur [name] is an 82 y.o. male presenting with fatigue, found to have anemia with hemoglobin to 6.5 on outpatient labs.  Patient was given 2 PRBCs.  Awaiting repeat CBC this morning to check hemoglobin.   [facility]     * (Principal) Symptomatic anemia     Hgb 6.5 on outpatient check 7/11, symptomatic with generalized fatigue.  No overt bleeding was apparent. Has been on Eliquis 2.5mg  BID for history  of DVT and PE. Also has an IVC filter in place. Per note from two weeks  ago, Dr. Arbutus Ped believed his anemia was likely a combination of anemia of  chronic disease, iron deficiency, and hemolytic anemia. He is at increased  risk for hemolytic anemia with his alectinib therapy. This would be less  likely given he's been on this for >1 year. Of course cannot rule out  acute bleed given acute drop in Hgb over the past two weeks or so.   Patient received 2U PRBCs. - Hold Eliquis  - Transfuse to >8 given cardiac history - Repeat CBC in the morning  - CMP, protime-INR - Will touch base with Dr. Arbutus Ped as to whether he would like more  testing and whether he believes alectinib may be contributing to a  potential hemolytic anemia          Status post coronary artery stent placement     - Continue Plavix - Continue Crestor  - Transfusion threshold 8        Adenocarcinoma of right lung, stage 4 (HCC)     Managed by Dr. Arbutus Ped, really doing quite well on Alectinib. - Stable - Continue Alectinib for now. If hemolysis panel is highly suspicious for  hemolytic process, would favor discontinuing.         FEN/GI: Vegetarian  diet PPx: SCDs Dispo:Home today. Barriers include resolving anemia.   Subjective:  Patient seen this morning resting in bed.  He states he feels well.  Denies shortness of breath or abdominal pain.  He is without complaint.  Objective: Temp:  [97.7 F (36.5 C)-98.8 F (37.1 C)] 98.3 F (36.8 C) (07/13 0336) Pulse Rate:  [61-73] 61 (07/13 0336) Resp:  [18-20] 20 (07/13 0336) BP: (120-150)/(46-70) 126/56 (07/13 0336) SpO2:  [97 %-100 %] 99 % (07/13 0336) Physical Exam: General: Resting comfortably in bed, no acute distress Cardiovascular: RRR, no murmurs Respiratory: CTA bilaterally.  No increased work of breathing.  On room air. Abdomen: Soft, nontender, nondistended Neuro: alert and oriented x3, speech normal in content. Psych:  Cognition and judgment appear intact. Alert, communicative  and cooperative.  Laboratory: Most recent CBC Lab Results  Component Value Date   WBC 9.8 08/10/2022   HGB 8.8 (L) 08/10/2022   HCT 25.3 (L) 08/10/2022   MCV 86.9 08/10/2022   PLT 322 08/10/2022   Most recent BMP    Latest Ref Rng & Units 08/09/2022    4:36 PM  BMP  Glucose 70 - 99 mg/dL 130   BUN 8 - 23 mg/dL 21   Creatinine 8.65 - 1.24 mg/dL 7.84   Sodium 696 - 295 mmol/L 137  Potassium 3.5 - 5.1 mmol/L 3.8   Chloride 98 - 111 mmol/L 105   CO2 22 - 32 mmol/L 22   Calcium 8.9 - 10.3 mg/dL 8.2     Cyndia Skeeters, DO 08/10/2022, 6:33 AM  PGY-1, [facility] FPTS Intern pager: [phone], text pages welcome Secure chat group [facility] Teaching Service

## 2022-08-09 NOTE — Progress Notes (Addendum)
FMTS Brief Progress Note  S: Saw patient this evening with Dr. Melissa Noon. Patient seen resting comfortably in bed with wife at bedside.  He is currently getting 1 unit of blood.  He denies chest pain, shortness of breath, nausea, headache.  Reports he feels his blood pressure is improving.   O: BP (!) 144/55   Pulse 73   Temp 98.8 F (37.1 C) (Oral)   Resp 18   Ht 5\' 11"  (1.803 m)   SpO2 100%   BMI 22.43 kg/m   General: Well-appearing, pleasant, no distress  A/P: Symptomatic anemia - Continue to hold Eliquis - Complete blood transfusion, and repeat CBC in the morning - Transfusion threshold < 8  S/p coronary artery stent placement - Continue Plavix and Crestor - Transfusion threshold < 8  - Orders reviewed. Labs for AM ordered.   Cyndia Skeeters, DO 08/09/2022, 7:50 PM PGY-1, [facility] Night Resident  Please page [phone] with questions.

## 2022-08-09 NOTE — Assessment & Plan Note (Addendum)
Hgb 6.5 on outpatient check yesterday, symptomatic with generalized fatigue. Curiously feeling much better today. No overt bleeding. BUN not elevated above his baseline of 26. Has been on Eliquis 2.5mg  BID for history of DVT and PE. Also has an  IVC filter in place. Per note from two weeks ago, Dr. Arbutus Ped believed his anemia was likely a combination of anemia of chronic diseaes, iron deficiency, and hemolytic anemia. He is at increased risk for hemolytic anemia with his alectinib therapy. This would be less likely given he's been on this for >1 year. Of course cannot rule out acute bleed given acute drop in Hgb over the past two weeks or so.  - Obs status, med-surg unit - Hold Eliquis  - Repeat CBC - Transfuse to >8 given cardiac history - Repeat CBC in the morning  - Retic, LDH, Haptoglobin, bilirubin - Will touch base with Dr. Arbutus Ped as to whether he would like more testing and whether he believes alectinib may be contributing to a potential hemolytic anemia  - Two large bore IV

## 2022-08-09 NOTE — Telephone Encounter (Signed)
Patients daughter returns call to nurse line.   I advised of hemoglobin result and recommendations below.   She reports they would prefer to have his infusion in an outpatient setting as they have preciously done before. She would prefer to avoid the hospital at this time.   She denies any SOB, chest pains or excessive fatigue. She reports she will keep an careful eye on him.   Will forward to provider to see if outpatient infusion is appropriate.   Strict ED precautions given to daughter.   Daughter- Lucille Passy [phone]

## 2022-08-10 DIAGNOSIS — E119 Type 2 diabetes mellitus without complications: Secondary | ICD-10-CM | POA: Diagnosis not present

## 2022-08-10 DIAGNOSIS — Z8673 Personal history of transient ischemic attack (TIA), and cerebral infarction without residual deficits: Secondary | ICD-10-CM | POA: Diagnosis not present

## 2022-08-10 DIAGNOSIS — Z86711 Personal history of pulmonary embolism: Secondary | ICD-10-CM | POA: Diagnosis not present

## 2022-08-10 DIAGNOSIS — Z955 Presence of coronary angioplasty implant and graft: Secondary | ICD-10-CM | POA: Diagnosis not present

## 2022-08-10 DIAGNOSIS — Z85118 Personal history of other malignant neoplasm of bronchus and lung: Secondary | ICD-10-CM | POA: Diagnosis not present

## 2022-08-10 DIAGNOSIS — Z79899 Other long term (current) drug therapy: Secondary | ICD-10-CM | POA: Diagnosis not present

## 2022-08-10 DIAGNOSIS — Z7902 Long term (current) use of antithrombotics/antiplatelets: Secondary | ICD-10-CM | POA: Diagnosis not present

## 2022-08-10 DIAGNOSIS — D649 Anemia, unspecified: Secondary | ICD-10-CM | POA: Diagnosis not present

## 2022-08-10 DIAGNOSIS — Z86718 Personal history of other venous thrombosis and embolism: Secondary | ICD-10-CM | POA: Diagnosis not present

## 2022-08-10 DIAGNOSIS — I2510 Atherosclerotic heart disease of native coronary artery without angina pectoris: Secondary | ICD-10-CM | POA: Diagnosis not present

## 2022-08-10 DIAGNOSIS — Z7901 Long term (current) use of anticoagulants: Secondary | ICD-10-CM | POA: Diagnosis not present

## 2022-08-10 LAB — COMPREHENSIVE METABOLIC PANEL
ALT: 23 U/L (ref 0–44)
AST: 32 U/L (ref 15–41)
Albumin: 2.7 g/dL — ABNORMAL LOW (ref 3.5–5.0)
Alkaline Phosphatase: 75 U/L (ref 38–126)
Anion gap: 8 (ref 5–15)
BUN: 17 mg/dL (ref 8–23)
CO2: 24 mmol/L (ref 22–32)
Calcium: 8.3 mg/dL — ABNORMAL LOW (ref 8.9–10.3)
Chloride: 104 mmol/L (ref 98–111)
Creatinine, Ser: 1.25 mg/dL — ABNORMAL HIGH (ref 0.61–1.24)
GFR, Estimated: 58 mL/min — ABNORMAL LOW (ref >60)
Glucose, Bld: 101 mg/dL — ABNORMAL HIGH (ref 70–99)
Potassium: 3.9 mmol/L (ref 3.5–5.1)
Sodium: 136 mmol/L (ref 135–145)
Total Bilirubin: 1.6 mg/dL — ABNORMAL HIGH (ref 0.3–1.2)
Total Protein: 5.7 g/dL — ABNORMAL LOW (ref 6.5–8.1)

## 2022-08-10 LAB — CBC
HCT: 25.3 % — ABNORMAL LOW (ref 39.0–52.0)
Hemoglobin: 8.8 g/dL — ABNORMAL LOW (ref 13.0–17.0)
MCH: 30.2 pg (ref 26.0–34.0)
MCHC: 34.8 g/dL (ref 30.0–36.0)
MCV: 86.9 fL (ref 80.0–100.0)
Platelets: 322 10*3/uL (ref 150–400)
RBC: 2.91 MIL/uL — ABNORMAL LOW (ref 4.22–5.81)
RDW: 19.5 % — ABNORMAL HIGH (ref 11.5–15.5)
WBC: 9.8 10*3/uL (ref 4.0–10.5)
nRBC: 2.2 % — ABNORMAL HIGH (ref 0.0–0.2)

## 2022-08-10 LAB — BPAM RBC
ISSUE DATE / TIME: 202407121830
ISSUE DATE / TIME: 202407122142

## 2022-08-10 LAB — TYPE AND SCREEN

## 2022-08-10 LAB — PROTIME-INR
INR: 1.2 (ref 0.8–1.2)
Prothrombin Time: 15.1 seconds (ref 11.4–15.2)

## 2022-08-10 MED ORDER — APIXABAN 2.5 MG PO TABS
2.5000 mg | ORAL_TABLET | Freq: Two times a day (BID) | ORAL | Status: DC
  Administered 2022-08-10: 2.5 mg via ORAL
  Filled 2022-08-10: qty 1

## 2022-08-10 NOTE — Plan of Care (Signed)

## 2022-08-10 NOTE — Assessment & Plan Note (Signed)
Hgb 6.5 on outpatient check 7/11, symptomatic with generalized fatigue. No overt bleeding was apparent. Has been on Eliquis 2.5mg  BID for history of DVT and PE. Also has an IVC filter in place. Per note from two weeks ago, Dr. Arbutus Ped believed his anemia was likely a combination of anemia of chronic disease, iron deficiency, and hemolytic anemia. He is at increased risk for hemolytic anemia with his alectinib therapy. This would be less likely given he's been on this for >1 year. Of course cannot rule out acute bleed given acute drop in Hgb over the past two weeks or so.  Patient received 2U PRBCs. - Hold Eliquis  - Transfuse to >8 given cardiac history - Repeat CBC in the morning  - CMP, protime-INR - Will touch base with Dr. Arbutus Ped as to whether he would like more testing and whether he believes alectinib may be contributing to a potential hemolytic anemia

## 2022-08-10 NOTE — Discharge Summary (Signed)
Family Medicine Teaching [facility] Discharge Summary  Patient name: Arthur [name] Medical record number: [id number] Date of birth: 1940-03-19 Age: 82 y.o. Gender: male Date of Admission: 08/09/2022  Date of Discharge: 08/10/2022 Admitting Physician: Alicia Amel, MD  Primary Care Provider: Carney Living, MD Consultants: None  Indication for Hospitalization: Symptomatic Anemia   Brief Hospital Course:  Arthur [name] is a 82 y.o.male with a history of Stage IV Adenocarcinoma of the lung, T2DM, CAD s/p stenting, HLD, DVT and PE on Eliquis and with IVC filter who was admitted to the [facility] Medicine Teaching Service at [facility] for symptomatic anemia.   His hospital course is detailed below:  Symptomatic anemia Presented with fatigue and was found to have anemia with hemoglobin to 6.5 on outpatient labs.  Did not have any apparent bleeding.  He was on Eliquis 2.5 Mg twice daily for history of DVT and PE, though this was held during admission.  Patient received 2U PRBCs and hemoglobin elevated to 8.8.  He had hemolysis labs which did not show any increase in intravascular hemolysis from his baseline.  He had an appropriately elevated reticulocyte count.  Ultimately, we could not identify a single cause of his anemia and I assume that this is multifactorial, with some component of hemolysis, some anemia of chronic disease, and some iron deficiency.  He is being managed for this as an outpatient with Dr. Arbutus Ped.  We restarted his Eliquis prior to discharge.  S/p coronary artery stent placement Was continued on Plavix and Crestor during hospitalization.  Adenocarcinoma of right lung, stage IV Patient is managed by Dr. Arbutus Ped, and doing well on Alectinib.  This was continued during hospitalization.    PCP Follow-up Recommendations: Repeat CBC at follow-up Ensure patient has follow-up scheduled with Dr. Arbutus Ped in the next 1-2 weeks.  Patient should continue his Fusion Plus  supplement    Discharge Diagnoses/Problem List:  Principal Problem:   Symptomatic anemia Active Problems:   Adenocarcinoma of right lung, stage 4 (HCC)   Status post coronary artery stent placement   Disposition: Home  Discharge Condition: Stable, at baseline  Discharge Exam:  From Dr. Kennith Gain same-day progress note General: Resting comfortably in bed, no acute distress Cardiovascular: RRR, no murmurs Respiratory: CTA bilaterally.  No increased work of breathing.  On room air. Abdomen: Soft, nontender, nondistended Neuro: alert and oriented x3, speech normal in content. Psych:  Cognition and judgment appear intact. Alert, communicative  and cooperative.   Significant Procedures: None   Significant Labs and Imaging:  Recent Labs  Lab 08/08/22 1632 08/09/22 1636 08/10/22 0616  WBC 11.8* 11.6* 9.8  HGB 6.5* 6.1* 8.8*  HCT 20.3* 19.1* 25.3*  PLT 347 352 322   Recent Labs  Lab 08/08/22 1632 08/09/22 1636 08/10/22 0616  NA 142 137 136  K 4.3 3.8 3.9  CL 106 105 104  CO2 21 22 24   GLUCOSE 158* 157* 101*  BUN 26 21 17   CREATININE 1.48* 1.43* 1.25*  CALCIUM 8.8 8.2* 8.3*  ALKPHOS  --  76 75  AST  --  37 32  ALT  --  22 23  ALBUMIN  --  3.0* 2.7*    Results/Tests Pending at Time of Discharge: none  Discharge Medications:  Allergies as of 08/10/2022       Reactions   Ace Inhibitors Cough   Iohexol    Severe ATN after CT with contrast May 2023 - creatinine peaked near 8. Did not require HD.  Medication List     STOP taking these medications    clopidogrel 75 MG tablet Commonly known as: PLAVIX       TAKE these medications    Alecensa 150 MG capsule Generic drug: alectinib Take 4 capsules (600 mg total) by mouth 2 (two) times daily with a meal.   baclofen 10 MG tablet Commonly known as: LIORESAL Take 1 tablet (10 mg total) by mouth daily. What changed:  when to take this reasons to take this   Eliquis 2.5 MG Tabs tablet Generic  drug: apixaban Take 1 tablet by mouth twice daily   furosemide 20 MG tablet Commonly known as: LASIX Take 20 mg by mouth See admin instructions. Take 20 mg (1 tablet) by mouth two to three times weekly.   Fusion Plus Caps Take 1 capsule by mouth daily.   polyethylene glycol 17 g packet Commonly known as: MIRALAX / GLYCOLAX Take 17 g by mouth 2 (two) times daily.   rosuvastatin 5 MG tablet Commonly known as: CRESTOR Take 1 tablet by mouth once daily   senna 8.6 MG Tabs tablet Commonly known as: SENOKOT Take 1 tablet (8.6 mg total) by mouth daily.        Discharge Instructions: Please refer to Patient Instructions section of EMR for full details.  Patient was counseled important signs and symptoms that should prompt return to medical care, changes in medications, dietary instructions, activity restrictions, and follow up appointments.   Follow-Up Appointments:  Follow-up Information     Bess Kinds, MD Follow up on 08/14/2022.   Specialty: Family Medicine Why: You have an appointment with Dr. Barbaraann Faster on 7/17 at 8:50 am. Please arrive 15 minutes early. Contact information: [address] [place] [address] [phone]         Si Gaul, MD Follow up.   Specialty: Oncology Contact information: [address] [place] [address] [phone]                 Alicia Amel, MD 08/10/2022, 11:45 AM PGY-3, [facility] Family [facility]

## 2022-08-10 NOTE — Discharge Instructions (Addendum)
Arthur [name], Arthur [name] were admitted because your hemoglobin got pretty low (6.1).  Unfortunately, we still do not know exactly why this happened, but we know that this is not the first time that you have had this issue.  The most important thing for you to do is to schedule a follow-up appointment with Dr. Arbutus Ped as he has been managing this anemia up to this point.  Given that it does not seem you are having or have had a GI bleed, I think it is safe you to continue taking your Eliquis.  If, however, you notice that you have any blood in your stool, please stop taking the Eliquis immediately and call us at [phone].  We will see you in clinic next week to repeat your hemoglobin measurement.  Eliezer Mccoy, MD     PCP Follow-up Recommendations: Repeat CBC at follow-up Ensure patient has follow-up scheduled with Dr. Arbutus Ped in the next 1-2 weeks.  Patient should continue his Fusion Plus supplement

## 2022-08-10 NOTE — Plan of Care (Signed)

## 2022-08-11 LAB — HAPTOGLOBIN: Haptoglobin: <10 mg/dL — ABNORMAL LOW (ref 38–329)

## 2022-08-12 ENCOUNTER — Other Ambulatory Visit: Payer: Self-pay | Admitting: Internal Medicine

## 2022-08-12 ENCOUNTER — Encounter (HOSPITAL_COMMUNITY): Payer: 59

## 2022-08-14 ENCOUNTER — Ambulatory Visit (INDEPENDENT_AMBULATORY_CARE_PROVIDER_SITE_OTHER): Payer: 59 | Admitting: Student

## 2022-08-14 ENCOUNTER — Encounter: Payer: Self-pay | Admitting: Student

## 2022-08-14 VITALS — BP 133/48 | HR 71 | Ht 71.0 in

## 2022-08-14 DIAGNOSIS — Z09 Encounter for follow-up examination after completed treatment for conditions other than malignant neoplasm: Secondary | ICD-10-CM

## 2022-08-14 DIAGNOSIS — R140 Abdominal distension (gaseous): Secondary | ICD-10-CM | POA: Insufficient documentation

## 2022-08-14 DIAGNOSIS — D649 Anemia, unspecified: Secondary | ICD-10-CM | POA: Diagnosis not present

## 2022-08-14 MED ORDER — BENZONATATE 100 MG PO CAPS: 100.0000 mg | ORAL_CAPSULE | Freq: Two times a day (BID) | ORAL | 0 refills | Status: DC | PRN

## 2022-08-14 NOTE — Assessment & Plan Note (Signed)
Patient here for follow-up after hospital discharge.  Patient was hospitalized for acute anemia, with hemoglobin of 6.8.  Patient received 2 units PRBC.  Eliquis was held and restarted charge.  Etiology of anemia thought to be AICD, IDA, possibly hemolysis.  Patient to follow-up with hematology oncology, in next 2 weeks.  [name] son discussed issue with getting a hold of someone, provider will send message to oncologist personally.  Patient otherwise feeling well, reports no longer feeling weak, return of appetite, good bathroom habits, no bruising or bleeding.  Will check CBC today. - CBC - Follow-up with heme-onc

## 2022-08-14 NOTE — Progress Notes (Signed)
SUBJECTIVE:   CHIEF COMPLAINT / HPI:   Hospital F/u Patient admitted for anemia w/ Hgb 6.8 -Received 2 units PRBC, held/restarted eliquis -Etiology likely multifactorial (AICD, IDA, hemolysis?) F/u w/ Onc and CBC  Doing well, feels like he has his energy back, and is eating better/good. No bruising or bleeding noted. Normal bathroom habits. Good energy level.  Gas: Report's having some gas/stomach grumbling, wanting recommendation for medication to help. Is passing gas, just noting that he's having more gas/discomfort from it. Having sporadic bowel movements, because he was taking miralax, but was going to frequently, and has now stopped the miralax.   Cough Medicine Ask for refill of tessalon pearls, uses infrequently for cough.   PERTINENT  PMH / PSH:    Patient Care Team: Carney Living, MD as PCP - General (Family Medicine) Jodelle Red, MD as PCP - Cardiology (Cardiology) Rollene Rotunda, MD (Cardiology) Nelson Chimes, MD (Ophthalmology) OBJECTIVE:  BP (!) 133/48   Pulse 71   Ht 5\' 11"  (1.803 m)   SpO2 100%   BMI 22.43 kg/m  Physical Exam Constitutional:      General: He is not in acute distress.    Appearance: Normal appearance. He is normal weight. He is not ill-appearing or toxic-appearing.  HENT:     Mouth/Throat:     Mouth: Mucous membranes are moist.     Pharynx: Oropharynx is clear.  Cardiovascular:     Rate and Rhythm: Normal rate and regular rhythm.     Pulses: Normal pulses.     Heart sounds: Normal heart sounds. No murmur heard.    No friction rub. No gallop.  Pulmonary:     Effort: Pulmonary effort is normal. No respiratory distress.     Breath sounds: Normal breath sounds. No stridor. No wheezing, rhonchi or rales.  Abdominal:     General: Bowel sounds are normal. There is no distension.     Palpations: Abdomen is soft. There is no mass.     Tenderness: There is no abdominal tenderness. There is no guarding.     Hernia: No  hernia is present.  Skin:    Capillary Refill: Capillary refill takes 2 to 3 seconds.  Neurological:     Mental Status: He is alert.  Psychiatric:        Mood and Affect: Mood normal.        Behavior: Behavior normal.      ASSESSMENT/PLAN:  Hospital discharge follow-up  Anemia, unspecified type -     CBC  Symptomatic anemia Assessment & Plan: Patient here for follow-up after hospital discharge.  Patient was hospitalized for acute anemia, with hemoglobin of 6.8.  Patient received 2 units PRBC.  Eliquis was held and restarted charge.  Etiology of anemia thought to be AICD, IDA, possibly hemolysis.  Patient to follow-up with hematology oncology, in next 2 weeks.  [name] son discussed issue with getting a hold of someone, provider will send message to oncologist personally.  Patient otherwise feeling well, reports no longer feeling weak, return of appetite, good bathroom habits, no bruising or bleeding.  Will check CBC today. - CBC - Follow-up with heme-onc    Gassiness Assessment & Plan: Patient noting he has been more gassy lately, and requesting medication to help with symptoms.  Patient reports he is passing gas, however he just notes he has more recently, with more stomach growling.  Patient having sporadic bowel movements, as he has history of constipation, and has stopped MiraLAX.  Patient was taking MiraLAX  but discontinued for too many loose stools.  Discussed restarting MiraLAX at measured dose, titrated to an bowel movements as needed.  Discussed use of Gas-X/simethicone with patient. - OTC simethicone   Other orders -     Benzonatate; Take 1 capsule (100 mg total) by mouth 2 (two) times daily as needed for cough.  Dispense: 30 capsule; Refill: 0   No follow-ups on file. Bess Kinds, MD 08/14/2022, 10:52 AM PGY-3, [facility] Family [facility]

## 2022-08-14 NOTE — Patient Instructions (Addendum)
It was great to see you! Thank you for allowing me to participate in your care!  I recommend that you always bring your medications to each appointment as this makes it easy to ensure we are on the correct medications and helps [place] not miss when refills are needed.  Our plans for today:  - Anemia You were hospitalized for anemia, this was thought to be caused by your chronic disease, and iron deficiency, and possibly other factors. You were treated with a blood transfusion.  We recommend you follow up with your Oncologist, within the next 2 weeks, to discuss your recent anemia Checking CBC, again, for any signs of bleeding - Gas  Take GasX (simethicone) it's available over the counter  -Cough Refilled cough medicine. Use as needed, and keep away from children as they are fatal to children  We are checking some labs today, I will call you if they are abnormal will send you a MyChart message or a letter if they are normal.  If you do not hear about your labs in the next 2 weeks please let us know.  Take care and seek immediate care sooner if you develop any concerns.   Dr. Bess Kinds, MD [facility] Medicine

## 2022-08-14 NOTE — Assessment & Plan Note (Signed)
Patient noting he has been more gassy lately, and requesting medication to help with symptoms.  Patient reports he is passing gas, however he just notes he has more recently, with more stomach growling.  Patient having sporadic bowel movements, as he has history of constipation, and has stopped MiraLAX.  Patient was taking MiraLAX but discontinued for too many loose stools.  Discussed restarting MiraLAX at measured dose, titrated to an bowel movements as needed.  Discussed use of Gas-X/simethicone with patient. - OTC simethicone

## 2022-08-15 LAB — CBC
Hematocrit: 28.7 % — ABNORMAL LOW (ref 37.5–51.0)
Hemoglobin: 9.1 g/dL — ABNORMAL LOW (ref 13.0–17.7)
MCH: 28 pg (ref 26.6–33.0)
MCHC: 31.7 g/dL (ref 31.5–35.7)
MCV: 88 fL (ref 79–97)
Platelets: 389 10*3/uL (ref 150–450)
RBC: 3.25 x10E6/uL — ABNORMAL LOW (ref 4.14–5.80)
RDW: 17.2 % — ABNORMAL HIGH (ref 11.6–15.4)
WBC: 8.9 10*3/uL (ref 3.4–10.8)

## 2022-08-20 ENCOUNTER — Telehealth: Payer: Self-pay | Admitting: Medical Oncology

## 2022-08-20 NOTE — Telephone Encounter (Signed)
07/12- hgb 6.1 Received blood transfusion in hospital.   Son requested appt.

## 2022-08-22 ENCOUNTER — Other Ambulatory Visit (HOSPITAL_COMMUNITY): Payer: Self-pay

## 2022-08-24 NOTE — Progress Notes (Unsigned)
[facility] OFFICE PROGRESS NOTE  Carney Living, MD [address] [place] [address]  DIAGNOSIS: 1) Stage IV (T1b, N3, M1 C) non-small cell lung cancer, adenocarcinoma with positive ALK gene translocation diagnosed in February 2023. 2) deep venous thrombosis of the left lower extremity  PRIOR THERAPY: None  CURRENT THERAPY:  1) Alecensa (Alectinib) 600 mg p.o. twice daily.  First dose April 11, 2021.  Status post 16.5 months of treatment. 2) Eliquis 2.5 mg p.o. twice daily.  First dose 10/11/2021. 3) Oral B12 supplements.   INTERVAL HISTORY: Arthur [name] 82 y.o. male returns to the clinic today for a follow-up visit accompanied by his son.  The patient is followed by the clinic for his history of non-small cell lung cancer.  He was last seen by Dr. Arbutus Ped on 07/25/2022.  He is currently on targeted treatment with Alecensa.  He has been on this since March 2023.  He tolerates it well.  However, the patient struggles with anemia which Dr. Arbutus Ped feels to be a combination of anemia of chronic disease, iron deficiency, and hemolytic anemia.  He is on oral iron supplements with Integra plus. He states he was not aware he should be taking B12 supplements.  Of note the patient is on Eliquis 2.5 mg p.o. twice daily for his history of DVT and PE. He has IVC filter.   In July, he presented to his PCP for weakness and back pain. Labs notable for worsening anemia (Hbg 6.5 from 9.2 two weeks prior). He was admitted for workup. He received 2 units of blood. He had elevated reticulocytes, continued low haptoglobin <10, and bilirubin of 1.5 mg.   Since being discharged from the hospital the patient states he feels "good".  He denies any visible bleeding.  He denies any significant fatigue or weakness.  Denies any fever, chills, night sweats, unexplained weight loss.  Denies any chest pain, shortness of breath, cough, or hemoptysis.  Denies any nausea, vomiting,  diarrhea, or constipation.  He has stable bilateral lower extremity swelling.  Denies any rashes or skin changes.  Denies any headache or visual changes.  He is here today for evaluation repeat blood work.    MEDICAL HISTORY: Past Medical History:  Diagnosis Date   Diabetes mellitus without complication (HCC)    High cholesterol    Hypertension    lung ca    Normal nuclear stress test 01/29/2008   stress perfusion study apparently in 2010 in [facility] which he said was negative.    ALLERGIES:  is allergic to ace inhibitors and iohexol.  MEDICATIONS:  Current Outpatient Medications  Medication Sig Dispense Refill   alectinib (ALECENSA) 150 MG capsule Take 4 capsules (600 mg total) by mouth 2 (two) times daily with a meal. 240 capsule 3   baclofen (LIORESAL) 10 MG tablet Take 1 tablet (10 mg total) by mouth daily. (Patient taking differently: Take 10 mg by mouth daily as needed for muscle spasms.) 15 each 0   benzonatate (TESSALON) 100 MG capsule Take 1 capsule (100 mg total) by mouth 2 (two) times daily as needed for cough. 30 capsule 0   clopidogrel (PLAVIX) 75 MG tablet Take 1 tablet by mouth once daily with breakfast (Patient not taking: Reported on 08/10/2022) 90 tablet 0   ELIQUIS 2.5 MG TABS tablet Take 1 tablet by mouth twice daily 180 tablet 0   furosemide (LASIX) 20 MG tablet Take 20 mg by mouth See admin instructions. Take 20  mg (1 tablet) by mouth two to three times weekly.     Iron-FA-B Cmp-C-Biot-Probiotic (FUSION PLUS) CAPS Take 1 capsule by mouth daily.     polyethylene glycol (MIRALAX / GLYCOLAX) 17 g packet Take 17 g by mouth 2 (two) times daily.     rosuvastatin (CRESTOR) 5 MG tablet Take 1 tablet by mouth once daily 90 tablet 1   senna (SENOKOT) 8.6 MG TABS tablet Take 1 tablet (8.6 mg total) by mouth daily. 120 tablet 0   No current facility-administered medications for this visit.    SURGICAL HISTORY:  Past Surgical History:  Procedure Laterality Date    CATARACT EXTRACTION, BILATERAL  2006   CORONARY STENT INTERVENTION N/A 02/06/2021   Procedure: CORONARY STENT INTERVENTION;  Surgeon: Corky Crafts, MD;  Location: [facility] INVASIVE CV LAB;  Service: Cardiovascular;  Laterality: N/A;   CORONARY ULTRASOUND/IVUS N/A 02/06/2021   Procedure: Intravascular Ultrasound/IVUS;  Surgeon: Corky Crafts, MD;  Location: [facility] INVASIVE CV LAB;  Service: Cardiovascular;  Laterality: N/A;   FINE NEEDLE ASPIRATION  03/21/2021   Procedure: FINE NEEDLE ASPIRATION;  Surgeon: Josephine Igo, DO;  Location: [facility] ENDOSCOPY;  Service: Pulmonary;;   IR RADIOLOGIST EVAL & MGMT  12/24/2021   KNEE SURGERY  2005   LEFT HEART CATH AND CORONARY ANGIOGRAPHY N/A 02/06/2021   Procedure: LEFT HEART CATH AND CORONARY ANGIOGRAPHY;  Surgeon: Corky Crafts, MD;  [facility] INVAS[facility] CV LAB;  Service: Cardiovascular;  Laterality: N/A;   VIDEO BRONCHOSCOPY WITH ENDOBRONCHIAL ULTRASOUND Bilateral 03/21/2021   Procedure: VIDEO BRONCHOSCOPY WITH ENDOBRONCHIAL ULTRASOUND;  Surgeon: Josephine Igo, DO;  Location: [facility] ENDOSCOPY;  Service: Pulmonary;  Laterality: Bilateral;    REVIEW OF SYSTEMS:   Review of Systems  Constitutional: Negative for appetite change, chills, fatigue, fever and unexpected weight change.  HENT: Negative for mouth sores, nosebleeds, sore throat and trouble swallowing.   Eyes: Negative for eye problems and icterus.  Respiratory: Negative for cough, hemoptysis, shortness of breath and wheezing.   Cardiovascular: Negative for chest pain positive for leg swelling. Gastrointestinal: Negative for abdominal pain, constipation, diarrhea, nausea and vomiting.  Genitourinary: Negative for bladder incontinence, difficulty urinating, dysuria, frequency and hematuria.   Musculoskeletal: Negative for back pain, gait problem, neck pain and neck stiffness.  Skin: Negative for itching and rash.  Neurological: Negative for dizziness, extremity weakness, gait problem,  headaches, light-headedness and seizures.  Hematological: Negative for adenopathy. Does not bruise/bleed easily.  Psychiatric/Behavioral: Negative for confusion, depression and sleep disturbance. The patient is not nervous/anxious.     PHYSICAL EXAMINATION:  There were no vitals taken for this visit.  ECOG PERFORMANCE STATUS: 1  Physical Exam  Constitutional: Oriented to person, place, and time and well-developed, well-nourished, and in no distress.  HENT:  Head: Normocephalic and atraumatic.  Mouth/Throat: Oropharynx is clear and moist. No oropharyngeal exudate.  Eyes: Conjunctivae are normal. Right eye exhibits no discharge. Left eye exhibits no discharge. No scleral icterus.  Neck: Normal range of motion. Neck supple.  Cardiovascular: Normal rate, regular rhythm, normal heart sounds and intact distal pulses.   Pulmonary/Chest: Effort normal and breath sounds normal. No respiratory distress. No wheezes. No rales.  Abdominal: Soft. Bowel sounds are normal. Exhibits no distension and no mass. There is no tenderness.  Musculoskeletal: Normal range of motion. Exhibits no edema.  Lymphadenopathy:    No cervical adenopathy.  Neurological: Alert and oriented to person, place, and time. Exhibits muscle wasting. Examined in the wheelchair.  Skin: Skin is warm and  dry. No rash noted. Not diaphoretic. No erythema. No pallor.  Psychiatric: Mood, memory and judgment normal.  Vitals reviewed.  LABORATORY DATA: Lab Results  Component Value Date   WBC 8.9 08/14/2022   HGB 9.1 (L) 08/14/2022   HCT 28.7 (L) 08/14/2022   MCV 88 08/14/2022   PLT 389 08/14/2022      Chemistry      Component Value Date/Time   NA 136 08/10/2022 0616   NA 142 08/08/2022 1632   K 3.9 08/10/2022 0616   CL 104 08/10/2022 0616   CO2 24 08/10/2022 0616   BUN 17 08/10/2022 0616   BUN 26 08/08/2022 1632   CREATININE 1.25 (H) 08/10/2022 0616   CREATININE 1.34 (H) 07/25/2022 0837   CREATININE 1.18 11/15/2015 1114       Component Value Date/Time   CALCIUM 8.3 (L) 08/10/2022 0616   ALKPHOS 75 08/10/2022 0616   AST 32 08/10/2022 0616   AST 30 07/25/2022 0837   ALT 23 08/10/2022 0616   ALT 18 07/25/2022 0837   BILITOT 1.6 (H) 08/10/2022 0616   BILITOT 1.6 (H) 07/25/2022 0837       RADIOGRAPHIC STUDIES:  No results found.   ASSESSMENT/PLAN:  This is a very pleasant 82 year old [place] male diagnosed with stage IV (T1b, N3, M1C) non-small cell lung cancer, adenocarcinoma. The patient has a positive ALK gene translocation. He was diagnosed in February 2023.   The patient is currently undergoing treatment with Alecensa (Alectinib) 600 mg p.o. twice daily.  He has been tolerating the treatment well with no concerning complaints.  He is status post 16.5 months of treatment.    The patient was recently hospitalized for worsening anemia Hbg 6.5 from 9.2 two weeks prior). Received 2 units of blood. Dr. Arbutus Ped believes this is a combination of IDA, anemia of chronic disease, and hemolytic anemia.   The patient was seen with Dr. Arbutus Ped today. Labs were reviewed.  Hemoglobin is down to 7.5 today.  We will administer 2 units of blood today.  Mohamed recommends that he continue taking his Alecensa but will start him on 10 mg of prednisone p.o. daily for possible hemolytic anemia.  We will see him back for a follow-up visit on 09/24/2022 and review his restaging CT scan at that time.  We will arrange for a lab appointment in the next 1 and half to 2 weeks to ensure he does not need an additional blood transfusion.  He will continue his iron supplement.  I instructed him to pick up B12 supplement over the counter.   He will continue his Eliquis for history of DVT and PE.  His IVC filter is still in place.  The patient was advised to call immediately if he has any concerning symptoms in the interval. The patient voices understanding of current disease status and treatment options and is in agreement with the  current care plan. All questions were answered. The patient knows to call the clinic with any problems, questions or concerns. We can certainly see the patient much sooner if necessary   No orders of the defined types were placed in this encounter.    Dalilah Curlin L Atia Haupt, PA-C 08/24/22  ADDENDUM: Hematology/Oncology Attending: I had a face to face encounter with the patient today.  I reviewed his records, lab and recommended his care plan.  This is a very pleasant 82 years old [place] male with stage IV non-small cell lung cancer, adenocarcinoma with positive ALK gene translocation diagnosed in February 2023 as  well as history of extensive deep venous thrombosis of the left lower extremity.  The patient is currently on treatment with Alecensa (Alectinib) 600 mg p.o. twice daily status post 16.5 months of treatment.  He has been tolerating the treatment well.  He is also on Eliquis for the history of deep venous thrombosis with IVC filter placement.  He was admitted to the hospital recently with significant anemia and hemoglobin down to 6.1.  He received 2 units of PRBCs transfusion on August 09, 2022 with improvement of his hemoglobin up to 9.1 on 08/14/2022. He had some evidence of hemolysis with low haptoglobin. The patient had repeat CBC today that showed hemoglobin of 7.1 and hematocrit 23.5.  I recommended for the patient to receive 2 unit of PRBCs transfusion today if possible.  The hemolytic part of the anemia could be secondary to his treatment with Alecensa (Alectinib) but the patient has been doing fine regarding his lung cancer with this medication.  We may consider reducing his dose in the future if needed or switching to Lorlatinib. We will see the patient back for follow-up visit in around 4 weeks with repeat lab and scan for restaging of his disease. He was advised to call immediately if he has any other concerning symptoms in the interval. The total time spent in the appointment  was 30 minutes. Disclaimer: This note was dictated with voice recognition software. Similar sounding words can inadvertently be transcribed and may be missed upon review. Lajuana Matte, MD

## 2022-08-27 ENCOUNTER — Inpatient Hospital Stay (HOSPITAL_BASED_OUTPATIENT_CLINIC_OR_DEPARTMENT_OTHER): Payer: 59 | Admitting: Physician Assistant

## 2022-08-27 ENCOUNTER — Inpatient Hospital Stay: Payer: 59

## 2022-08-27 ENCOUNTER — Other Ambulatory Visit (HOSPITAL_COMMUNITY): Payer: Self-pay

## 2022-08-27 ENCOUNTER — Inpatient Hospital Stay: Payer: 59 | Attending: Physician Assistant

## 2022-08-27 ENCOUNTER — Other Ambulatory Visit: Payer: Self-pay | Admitting: *Deleted

## 2022-08-27 ENCOUNTER — Other Ambulatory Visit: Payer: Self-pay

## 2022-08-27 VITALS — BP 142/50 | HR 72 | Temp 97.3°F | Resp 14 | Wt 161.5 lb

## 2022-08-27 DIAGNOSIS — D649 Anemia, unspecified: Secondary | ICD-10-CM

## 2022-08-27 DIAGNOSIS — C3491 Malignant neoplasm of unspecified part of right bronchus or lung: Secondary | ICD-10-CM

## 2022-08-27 DIAGNOSIS — Z5111 Encounter for antineoplastic chemotherapy: Secondary | ICD-10-CM

## 2022-08-27 DIAGNOSIS — Z79899 Other long term (current) drug therapy: Secondary | ICD-10-CM | POA: Diagnosis not present

## 2022-08-27 DIAGNOSIS — C3490 Malignant neoplasm of unspecified part of unspecified bronchus or lung: Secondary | ICD-10-CM | POA: Diagnosis not present

## 2022-08-27 LAB — TYPE AND SCREEN
ABO/RH(D): B NEG
Antibody Screen: NEGATIVE
Unit division: 0
Unit division: 0

## 2022-08-27 LAB — CBC WITH DIFFERENTIAL (CANCER CENTER ONLY)
Abs Immature Granulocytes: 0.06 10*3/uL (ref 0.00–0.07)
Basophils Absolute: 0.1 10*3/uL (ref 0.0–0.1)
Basophils Relative: 1 %
Eosinophils Absolute: 0.4 10*3/uL (ref 0.0–0.5)
Eosinophils Relative: 4 %
HCT: 23.5 % — ABNORMAL LOW (ref 39.0–52.0)
Hemoglobin: 7.5 g/dL — ABNORMAL LOW (ref 13.0–17.0)
Immature Granulocytes: 1 %
Lymphocytes Relative: 26 %
Lymphs Abs: 2.4 10*3/uL (ref 0.7–4.0)
MCH: 29 pg (ref 26.0–34.0)
MCHC: 31.9 g/dL (ref 30.0–36.0)
MCV: 90.7 fL (ref 80.0–100.0)
Monocytes Absolute: 0.8 10*3/uL (ref 0.1–1.0)
Monocytes Relative: 9 %
Neutro Abs: 5.3 10*3/uL (ref 1.7–7.7)
Neutrophils Relative %: 59 %
Platelet Count: 271 10*3/uL (ref 150–400)
RBC: 2.59 MIL/uL — ABNORMAL LOW (ref 4.22–5.81)
RDW: 17.9 % — ABNORMAL HIGH (ref 11.5–15.5)
WBC Count: 9.1 10*3/uL (ref 4.0–10.5)
nRBC: 0 % (ref 0.0–0.2)

## 2022-08-27 LAB — CMP (CANCER CENTER ONLY)
ALT: 20 U/L (ref 0–44)
AST: 31 U/L (ref 15–41)
Albumin: 3.5 g/dL (ref 3.5–5.0)
Alkaline Phosphatase: 78 U/L (ref 38–126)
Anion gap: 7 (ref 5–15)
BUN: 22 mg/dL (ref 8–23)
CO2: 24 mmol/L (ref 22–32)
Calcium: 8.5 mg/dL — ABNORMAL LOW (ref 8.9–10.3)
Chloride: 109 mmol/L (ref 98–111)
Creatinine: 1.31 mg/dL — ABNORMAL HIGH (ref 0.61–1.24)
GFR, Estimated: 55 mL/min — ABNORMAL LOW (ref >60)
Glucose, Bld: 101 mg/dL — ABNORMAL HIGH (ref 70–99)
Potassium: 4.5 mmol/L (ref 3.5–5.1)
Sodium: 140 mmol/L (ref 135–145)
Total Bilirubin: 1.5 mg/dL — ABNORMAL HIGH (ref 0.3–1.2)
Total Protein: 6.3 g/dL — ABNORMAL LOW (ref 6.5–8.1)

## 2022-08-27 LAB — FERRITIN: Ferritin: 421 ng/mL — ABNORMAL HIGH (ref 24–336)

## 2022-08-27 LAB — BPAM RBC
Blood Product Expiration Date: 202408282359
Blood Product Expiration Date: 202409042359
ISSUE DATE / TIME: 202407301140
ISSUE DATE / TIME: 202407301140
Unit Type and Rh: 1700
Unit Type and Rh: 9500

## 2022-08-27 LAB — IRON AND IRON BINDING CAPACITY (CC-WL,HP ONLY)
Iron: 68 ug/dL (ref 45–182)
Saturation Ratios: 25 % (ref 17.9–39.5)
TIBC: 270 ug/dL (ref 250–450)
UIBC: 202 ug/dL (ref 117–376)

## 2022-08-27 LAB — PREPARE RBC (CROSSMATCH)

## 2022-08-27 MED ORDER — SODIUM CHLORIDE 0.9% IV SOLUTION
250.0000 mL | Freq: Once | INTRAVENOUS | Status: AC
  Administered 2022-08-27: 250 mL via INTRAVENOUS

## 2022-08-27 MED ORDER — SODIUM CHLORIDE 0.9% FLUSH: 3.0000 mL | INTRAVENOUS | Status: DC | PRN

## 2022-08-27 MED ORDER — PREDNISONE 10 MG PO TABS
10.0000 mg | ORAL_TABLET | Freq: Every day | ORAL | 0 refills | Status: DC
Start: 2022-08-27 — End: 2022-10-08

## 2022-08-27 MED ORDER — DIPHENHYDRAMINE HCL 25 MG PO CAPS
25.0000 mg | ORAL_CAPSULE | Freq: Once | ORAL | Status: AC
  Administered 2022-08-27: 25 mg via ORAL
  Filled 2022-08-27: qty 1

## 2022-08-27 MED ORDER — ACETAMINOPHEN 325 MG PO TABS
650.0000 mg | ORAL_TABLET | Freq: Once | ORAL | Status: AC
  Administered 2022-08-27: 650 mg via ORAL
  Filled 2022-08-27: qty 2

## 2022-08-27 NOTE — Patient Instructions (Signed)

## 2022-08-28 ENCOUNTER — Other Ambulatory Visit: Payer: Self-pay | Admitting: Family Medicine

## 2022-08-28 DIAGNOSIS — I2510 Atherosclerotic heart disease of native coronary artery without angina pectoris: Secondary | ICD-10-CM

## 2022-08-29 ENCOUNTER — Other Ambulatory Visit: Payer: Self-pay

## 2022-08-29 ENCOUNTER — Telehealth: Payer: Self-pay | Admitting: Internal Medicine

## 2022-08-29 NOTE — Telephone Encounter (Signed)
Scheduled per 07/30 los, patient has been called and notified. 

## 2022-09-06 ENCOUNTER — Ambulatory Visit: Payer: 59 | Admitting: Student

## 2022-09-06 ENCOUNTER — Other Ambulatory Visit (HOSPITAL_COMMUNITY): Payer: Self-pay

## 2022-09-06 ENCOUNTER — Other Ambulatory Visit: Payer: Self-pay

## 2022-09-06 VITALS — BP 115/57 | HR 76 | Temp 99.3°F | Ht 71.0 in

## 2022-09-06 DIAGNOSIS — K5900 Constipation, unspecified: Secondary | ICD-10-CM | POA: Diagnosis not present

## 2022-09-06 DIAGNOSIS — R059 Cough, unspecified: Secondary | ICD-10-CM

## 2022-09-06 MED ORDER — BENZONATATE 100 MG PO CAPS
100.0000 mg | ORAL_CAPSULE | Freq: Two times a day (BID) | ORAL | 0 refills | Status: DC | PRN
Start: 2022-09-06 — End: 2022-10-16

## 2022-09-06 MED ORDER — LINACLOTIDE 145 MCG PO CAPS
145.0000 ug | ORAL_CAPSULE | Freq: Every day | ORAL | 0 refills | Status: DC
Start: 2022-09-06 — End: 2022-10-08

## 2022-09-06 MED ORDER — SALINE SPRAY 0.65 % NA SOLN: 1.0000 | NASAL | 1 refills | Status: DC | PRN

## 2022-09-06 NOTE — Assessment & Plan Note (Addendum)
Primarily suspect chronic cough from lung cancer/allergic rhinitis.  Viral URI on differential. - Follow-up if symptoms worsen or systemic symptoms develop

## 2022-09-06 NOTE — Progress Notes (Deleted)
    SUBJECTIVE:   CHIEF COMPLAINT / HPI:   ***  PERTINENT  PMH / PSH: ***  OBJECTIVE:   BP (!) 141/82   Pulse 76   Ht 5\' 11"  (1.803 m)   SpO2 99%   BMI 22.52 kg/m   ***  ASSESSMENT/PLAN:   No problem-specific Assessment & Plan notes found for this encounter.     Tiffany Kocher, DO [facility]

## 2022-09-06 NOTE — Patient Instructions (Signed)
It was great to see you! Thank you for allowing me to participate in your care!   I recommend that you always bring your medications to each appointment as this makes it easy to ensure we are on the correct medications and helps [place] not miss when refills are needed.  Our plans for today:  -Take linaclotide (Linzess) daily for 1 month -Please use nasal saline spray for dry sinuses to help with cough   Take care and seek immediate care sooner if you develop any concerns. Please remember to show up 15 minutes before your scheduled appointment time!  Tiffany Kocher, DO [facility]

## 2022-09-06 NOTE — Assessment & Plan Note (Signed)
Chronic constipation, not responding well to MiraLAX and Senokot regimen.  Suspect iron supplementation also contributing.

## 2022-09-06 NOTE — Progress Notes (Signed)
    SUBJECTIVE:   CHIEF COMPLAINT / HPI:   Cough Productive cough started yesterday.  Denies fevers, chills, sore throat, chest pain, shortness of breath, NVD.  Patient has taken [employer] as well as OTC Robitussin, with minimal relief.  Reports dry and protect sinuses-currently using azelastine no spray.  Of note patient is currently undergoing treatment for lung cancer with Dr. Shirline Frees, and taking Alecensa.   Constipation Patient reports constipation for greater than 6 months, near time of cancer diagnosis.  Currently taking MiraLAX and Senokot daily.  Also taking iron supplement daily, which she needs due to significant symptomatic anemia requiring blood transfusions.  Dark stools likely secondary to iron supplementation.  Reports eating well and staying hydrated, son confirms.  Patient requesting additional medication.  PERTINENT  PMH / PSH: Non-small cell carcinoma of the lung stage IV, essential hypertension, type 2 diabetes, renal insufficiency, hyperlipidemia  OBJECTIVE:   BP (!) 115/57   Pulse 76   Temp 99.3 F (37.4 C) (Oral)   Ht 5\' 11"  (1.803 m)   SpO2 99%   BMI 22.52 kg/m    General: NAD, pleasant, in wheel chair, able to participate in exam Cardio: RRR, no MRG. Cap Refill <2s. Respiratory: CTAB, normal wob on RA GI: Abdomen is soft, not tender, not distended. BS present Skin: Warm and dry  ASSESSMENT/PLAN:   Constipation, unspecified constipation type Assessment & Plan: Chronic constipation, not responding well to MiraLAX and Senokot regimen.  Suspect iron supplementation also contributing.  Orders: -     linaCLOtide; Take 1 capsule (145 mcg total) by mouth daily before breakfast.  Dispense: 30 capsule; Refill: 0  Cough, unspecified type Assessment & Plan: Primarily suspect chronic cough from lung cancer/allergic rhinitis.  Viral URI on differential. - Follow-up if symptoms worsen or systemic symptoms develop  Orders: -     Saline Spray; Place 1  spray into both nostrils as needed for congestion.  Dispense: 88 mL; Refill: 1 -     Benzonatate; Take 1 capsule (100 mg total) by mouth 2 (two) times daily as needed for cough.  Dispense: 30 capsule; Refill: 0   Tiffany Kocher, DO [facility]

## 2022-09-10 ENCOUNTER — Telehealth: Payer: Self-pay

## 2022-09-10 ENCOUNTER — Inpatient Hospital Stay: Payer: 59 | Attending: Physician Assistant

## 2022-09-10 ENCOUNTER — Other Ambulatory Visit: Payer: Self-pay

## 2022-09-10 ENCOUNTER — Inpatient Hospital Stay: Payer: 59

## 2022-09-10 DIAGNOSIS — C3490 Malignant neoplasm of unspecified part of unspecified bronchus or lung: Secondary | ICD-10-CM | POA: Diagnosis not present

## 2022-09-10 DIAGNOSIS — D649 Anemia, unspecified: Secondary | ICD-10-CM | POA: Diagnosis not present

## 2022-09-10 LAB — CBC WITH DIFFERENTIAL (CANCER CENTER ONLY)
Abs Immature Granulocytes: 0.08 10*3/uL — ABNORMAL HIGH (ref 0.00–0.07)
Basophils Absolute: 0.1 10*3/uL (ref 0.0–0.1)
Basophils Relative: 0 %
Eosinophils Absolute: 0.1 10*3/uL (ref 0.0–0.5)
Eosinophils Relative: 1 %
HCT: 33.9 % — ABNORMAL LOW (ref 39.0–52.0)
Hemoglobin: 11.6 g/dL — ABNORMAL LOW (ref 13.0–17.0)
Immature Granulocytes: 1 %
Lymphocytes Relative: 17 %
Lymphs Abs: 2.4 10*3/uL (ref 0.7–4.0)
MCH: 29.7 pg (ref 26.0–34.0)
MCHC: 34.2 g/dL (ref 30.0–36.0)
MCV: 86.9 fL (ref 80.0–100.0)
Monocytes Absolute: 1.1 10*3/uL — ABNORMAL HIGH (ref 0.1–1.0)
Monocytes Relative: 8 %
Neutro Abs: 9.9 10*3/uL — ABNORMAL HIGH (ref 1.7–7.7)
Neutrophils Relative %: 73 %
Platelet Count: 326 10*3/uL (ref 150–400)
RBC: 3.9 MIL/uL — ABNORMAL LOW (ref 4.22–5.81)
RDW: 16.2 % — ABNORMAL HIGH (ref 11.5–15.5)
WBC Count: 13.6 10*3/uL — ABNORMAL HIGH (ref 4.0–10.5)
nRBC: 0 % (ref 0.0–0.2)

## 2022-09-10 LAB — CMP (CANCER CENTER ONLY)
ALT: 20 U/L (ref 0–44)
AST: 32 U/L (ref 15–41)
Albumin: 3.6 g/dL (ref 3.5–5.0)
Alkaline Phosphatase: 82 U/L (ref 38–126)
Anion gap: 5 (ref 5–15)
BUN: 20 mg/dL (ref 8–23)
CO2: 27 mmol/L (ref 22–32)
Calcium: 8.6 mg/dL — ABNORMAL LOW (ref 8.9–10.3)
Chloride: 106 mmol/L (ref 98–111)
Creatinine: 1.24 mg/dL (ref 0.61–1.24)
GFR, Estimated: 58 mL/min — ABNORMAL LOW (ref >60)
Glucose, Bld: 157 mg/dL — ABNORMAL HIGH (ref 70–99)
Potassium: 4.9 mmol/L (ref 3.5–5.1)
Sodium: 138 mmol/L (ref 135–145)
Total Bilirubin: 1.5 mg/dL — ABNORMAL HIGH (ref 0.3–1.2)
Total Protein: 6.9 g/dL (ref 6.5–8.1)

## 2022-09-10 LAB — SAMPLE TO BLOOD BANK

## 2022-09-10 NOTE — Telephone Encounter (Signed)
CRITICAL VALUE STICKER  CRITICAL VALUE: HGB 11.6  RECEIVER (on-site recipient of call):  P. LPN  DATE & TIME NOTIFIED: 8/13 938am  MESSENGER (representative from lab): Shanda Bumps   MD NOTIFIED: Dr. Arbutus Ped  TIME OF NOTIFICATION: [phone]

## 2022-09-12 ENCOUNTER — Encounter: Payer: Self-pay | Admitting: Family Medicine

## 2022-09-13 MED ORDER — CETIRIZINE HCL 10 MG PO TABS: 10.0000 mg | ORAL_TABLET | Freq: Every day | ORAL | 0 refills | Status: DC

## 2022-09-17 ENCOUNTER — Other Ambulatory Visit: Payer: Self-pay | Admitting: Medical Oncology

## 2022-09-17 DIAGNOSIS — C3490 Malignant neoplasm of unspecified part of unspecified bronchus or lung: Secondary | ICD-10-CM

## 2022-09-17 DIAGNOSIS — T508X5D Adverse effect of diagnostic agents, subsequent encounter: Secondary | ICD-10-CM

## 2022-09-19 ENCOUNTER — Ambulatory Visit (HOSPITAL_COMMUNITY)
Admission: RE | Admit: 2022-09-19 | Discharge: 2022-09-19 | Disposition: A | Discharge status: Home / Self Care | Payer: 59 | Source: Ambulatory Visit | Attending: Internal Medicine | Admitting: Internal Medicine

## 2022-09-19 ENCOUNTER — Inpatient Hospital Stay: Payer: 59

## 2022-09-19 DIAGNOSIS — D3501 Benign neoplasm of right adrenal gland: Secondary | ICD-10-CM | POA: Diagnosis not present

## 2022-09-19 DIAGNOSIS — J439 Emphysema, unspecified: Secondary | ICD-10-CM | POA: Diagnosis not present

## 2022-09-19 DIAGNOSIS — C3490 Malignant neoplasm of unspecified part of unspecified bronchus or lung: Secondary | ICD-10-CM | POA: Insufficient documentation

## 2022-09-19 DIAGNOSIS — D649 Anemia, unspecified: Secondary | ICD-10-CM

## 2022-09-19 LAB — CMP (CANCER CENTER ONLY)
ALT: 18 U/L (ref 0–44)
AST: 25 U/L (ref 15–41)
Albumin: 3.3 g/dL — ABNORMAL LOW (ref 3.5–5.0)
Alkaline Phosphatase: 79 U/L (ref 38–126)
Anion gap: 7 (ref 5–15)
BUN: 29 mg/dL — ABNORMAL HIGH (ref 8–23)
CO2: 26 mmol/L (ref 22–32)
Calcium: 8.4 mg/dL — ABNORMAL LOW (ref 8.9–10.3)
Chloride: 104 mmol/L (ref 98–111)
Creatinine: 1.3 mg/dL — ABNORMAL HIGH (ref 0.61–1.24)
GFR, Estimated: 55 mL/min — ABNORMAL LOW (ref >60)
Glucose, Bld: 314 mg/dL — ABNORMAL HIGH (ref 70–99)
Potassium: 4.7 mmol/L (ref 3.5–5.1)
Sodium: 137 mmol/L (ref 135–145)
Total Bilirubin: 1.4 mg/dL — ABNORMAL HIGH (ref 0.3–1.2)
Total Protein: 6.7 g/dL (ref 6.5–8.1)

## 2022-09-19 LAB — CBC WITH DIFFERENTIAL (CANCER CENTER ONLY)
Abs Immature Granulocytes: 0.67 10*3/uL — ABNORMAL HIGH (ref 0.00–0.07)
Basophils Absolute: 0.1 10*3/uL (ref 0.0–0.1)
Basophils Relative: 0 %
Eosinophils Absolute: 0 10*3/uL (ref 0.0–0.5)
Eosinophils Relative: 0 %
HCT: 30 % — ABNORMAL LOW (ref 39.0–52.0)
Hemoglobin: 9.9 g/dL — ABNORMAL LOW (ref 13.0–17.0)
Immature Granulocytes: 5 %
Lymphocytes Relative: 10 %
Lymphs Abs: 1.5 10*3/uL (ref 0.7–4.0)
MCH: 28.7 pg (ref 26.0–34.0)
MCHC: 33 g/dL (ref 30.0–36.0)
MCV: 87 fL (ref 80.0–100.0)
Monocytes Absolute: 0.5 10*3/uL (ref 0.1–1.0)
Monocytes Relative: 3 %
Neutro Abs: 12.3 10*3/uL — ABNORMAL HIGH (ref 1.7–7.7)
Neutrophils Relative %: 82 %
Platelet Count: 367 10*3/uL (ref 150–400)
RBC: 3.45 MIL/uL — ABNORMAL LOW (ref 4.22–5.81)
RDW: 16 % — ABNORMAL HIGH (ref 11.5–15.5)
WBC Count: 15 10*3/uL — ABNORMAL HIGH (ref 4.0–10.5)
nRBC: 0 % (ref 0.0–0.2)

## 2022-09-19 LAB — IRON AND IRON BINDING CAPACITY (CC-WL,HP ONLY)
Iron: 74 ug/dL (ref 45–182)
Saturation Ratios: 33 % (ref 17.9–39.5)
TIBC: 223 ug/dL — ABNORMAL LOW (ref 250–450)
UIBC: 149 ug/dL (ref 117–376)

## 2022-09-19 LAB — SAMPLE TO BLOOD BANK

## 2022-09-19 LAB — FERRITIN: Ferritin: 699 ng/mL — ABNORMAL HIGH (ref 24–336)

## 2022-09-24 ENCOUNTER — Inpatient Hospital Stay (HOSPITAL_BASED_OUTPATIENT_CLINIC_OR_DEPARTMENT_OTHER): Payer: 59 | Admitting: Internal Medicine

## 2022-09-24 DIAGNOSIS — C3491 Malignant neoplasm of unspecified part of right bronchus or lung: Secondary | ICD-10-CM

## 2022-09-24 NOTE — Progress Notes (Signed)
[facility] [phone]   [phone]  PROGRESS NOTE FOR TELEMEDICINE VISITS  Carney Living, MD [address] [place] [address]  I connected withNAME@ on 09/24/22 at  9:15 AM EDT by telephone visit and verified that I am speaking with the correct person using two identifiers.   I discussed the limitations, risks, security and privacy concerns of performing an evaluation and management service by telemedicine and the availability of in-person appointments. I also discussed with the patient that there may be a patient responsible charge related to this service. The patient expressed understanding and agreed to proceed.  Other persons participating in the visit and their role in the encounter:  son  [name] location: Home Provider's location: [facility]  DIAGNOSIS: 1) Stage IV (T1b, N3, M1 C) non-small cell lung cancer, adenocarcinoma with positive ALK gene translocation diagnosed in February 2023. 2) deep venous thrombosis of the left lower extremity   PRIOR THERAPY: None   CURRENT THERAPY:  1) Alecensa (Alectinib) 600 mg p.o. twice daily.  First dose April 11, 2021.  Status post 17.5 months of treatment. 2) Eliquis 2.5 mg p.o. twice daily.  First dose 10/11/2021. 3) Oral B12 supplements.   INTERVAL HISTORY: Alphonsa R Quattro 82 y.o. male has a telephone virtual visit with me today for evaluation and discussion of his recent scan results.  The patient is feeling fine today with no concerning complaints.  He denied having any significant fatigue or weakness.  He denied having any nausea, vomiting, diarrhea.  He had constipation recently but in the last few days his bowel movements are regular.  He denied having any recent weight loss or night sweats.  He has no headache or visual changes.  He has been tolerating his treatment with Alecensa (Alectinib) fairly well.  He had repeat CT scan of the chest, abdomen and pelvis  performed recently and we are having the visit for evaluation and discussion of his scan results. MEDICAL HISTORY: Past Medical History:  Diagnosis Date   Diabetes mellitus without complication (HCC)    High cholesterol    Hypertension    lung ca    Normal nuclear stress test 01/29/2008   stress perfusion study apparently in 2010 in [facility] which he said was negative.    ALLERGIES:  is allergic to ace inhibitors and iohexol.  MEDICATIONS:  Current Outpatient Medications  Medication Sig Dispense Refill   alectinib (ALECENSA) 150 MG capsule Take 4 capsules (600 mg total) by mouth 2 (two) times daily with a meal. 240 capsule 3   baclofen (LIORESAL) 10 MG tablet Take 1 tablet (10 mg total) by mouth daily. (Patient taking differently: Take 10 mg by mouth daily as needed for muscle spasms.) 15 each 0   benzonatate (TESSALON) 100 MG capsule Take 1 capsule (100 mg total) by mouth 2 (two) times daily as needed for cough. 30 capsule 0   cetirizine (ZYRTEC) 10 MG tablet Take 1 tablet (10 mg total) by mouth daily. 30 tablet 0   clopidogrel (PLAVIX) 75 MG tablet Take 1 tablet by mouth once daily with breakfast (Patient not taking: Reported on 08/10/2022) 90 tablet 0   ELIQUIS 2.5 MG TABS tablet Take 1 tablet by mouth twice daily 180 tablet 0   furosemide (LASIX) 20 MG tablet Take 20 mg by mouth See admin instructions. Take 20 mg (1 tablet) by mouth two to three times weekly.     Iron-FA-B Cmp-C-Biot-Probiotic (FUSION PLUS) CAPS Take 1  capsule by mouth daily.     linaclotide (LINZESS) 145 MCG CAPS capsule Take 1 capsule (145 mcg total) by mouth daily before breakfast. 30 capsule 0   polyethylene glycol (MIRALAX / GLYCOLAX) 17 g packet Take 17 g by mouth 2 (two) times daily.     predniSONE (DELTASONE) 10 MG tablet Take 1 tablet (10 mg total) by mouth daily with breakfast. 40 tablet 0   rosuvastatin (CRESTOR) 5 MG tablet Take 1 tablet by mouth once daily 90 tablet 2   senna (SENOKOT) 8.6 MG  TABS tablet Take 1 tablet (8.6 mg total) by mouth daily. 120 tablet 0   sodium chloride (OCEAN) 0.65 % SOLN nasal spray Place 1 spray into both nostrils as needed for congestion. 88 mL 1   No current facility-administered medications for this visit.    SURGICAL HISTORY:  Past Surgical History:  Procedure Laterality Date   CATARACT EXTRACTION, BILATERAL  2006   CORONARY STENT INTERVENTION N/A 02/06/2021   Procedure: CORONARY STENT INTERVENTION;  Surgeon: Corky Crafts, MD;  Location: [facility] INVASIVE CV LAB;  Service: Cardiovascular;  Laterality: N/A;   CORONARY ULTRASOUND/IVUS N/A 02/06/2021   Procedure: Intravascular Ultrasound/IVUS;  Surgeon: Corky Crafts, MD;  Location: [facility] INVASIVE CV LAB;  Service: Cardiovascular;  Laterality: N/A;   FINE NEEDLE ASPIRATION  03/21/2021   Procedure: FINE NEEDLE ASPIRATION;  Surgeon: Josephine Igo, DO;  Location: [facility] ENDOSCOPY;  Service: Pulmonary;;   IR RADIOLOGIST EVAL & MGMT  12/24/2021   KNEE SURGERY  2005   LEFT HEART CATH AND CORONARY ANGIOGRAPHY N/A 02/06/2021   Procedure: LEFT HEART CATH AND CORONARY ANGIOGRAPHY;  Surgeon: Corky Crafts, MD;  Location: [facility] INVASIVE CV LAB;  Service: Cardiovascular;  Laterality: N/A;   VIDEO BRONCHOSCOPY WITH ENDOBRONCHIAL ULTRASOUND Bilateral 03/21/2021   Procedure: VIDEO BRONCHOSCOPY WITH ENDOBRONCHIAL ULTRASOUND;  Surgeon: Josephine Igo, DO;  Location: [facility] ENDOSCOPY;  Service: Pulmonary;  Laterality: Bilateral;    REVIEW OF SYSTEMS:  Constitutional: positive for fatigue Eyes: negative Ears, nose, mouth, throat, and face: negative Respiratory: negative Cardiovascular: negative Gastrointestinal: negative Genitourinary:negative Integument/breast: negative Hematologic/lymphatic: negative Musculoskeletal:negative Neurological: negative Behavioral/Psych: negative Endocrine: negative Allergic/Immunologic: negative    LABORATORY DATA: Lab Results  Component Value Date   WBC 15.0 (H)  09/19/2022   HGB 9.9 (L) 09/19/2022   HCT 30.0 (L) 09/19/2022   MCV 87.0 09/19/2022   PLT 367 09/19/2022      Chemistry      Component Value Date/Time   NA 137 09/19/2022 1401   NA 142 08/08/2022 1632   K 4.7 09/19/2022 1401   CL 104 09/19/2022 1401   CO2 26 09/19/2022 1401   BUN 29 (H) 09/19/2022 1401   BUN 26 08/08/2022 1632   CREATININE 1.30 (H) 09/19/2022 1401   CREATININE 1.18 11/15/2015 1114      Component Value Date/Time   CALCIUM 8.4 (L) 09/19/2022 1401   ALKPHOS 79 09/19/2022 1401   AST 25 09/19/2022 1401   ALT 18 09/19/2022 1401   BILITOT 1.4 (H) 09/19/2022 1401       RADIOGRAPHIC STUDIES: CT CHEST ABDOMEN PELVIS WO CONTRAST  Result Date: 09/23/2022 CLINICAL DATA:  Restaging non-small cell lung cancer. * Tracking Code: BO * EXAM: CT CHEST, ABDOMEN AND PELVIS WITHOUT CONTRAST TECHNIQUE: Multidetector CT imaging of the chest, abdomen and pelvis was performed following the standard protocol without IV contrast. RADIATION DOSE REDUCTION: This exam was performed according to the departmental dose-optimization program which includes automated exposure control, adjustment of the  mA and/or kV according to patient size and/or use of iterative reconstruction technique. COMPARISON:  06/10/2022 FINDINGS: CT CHEST FINDINGS Cardiovascular: The heart size is within normal limits. Aortic atherosclerosis and coronary artery calcifications. No pericardial effusion identified. Mediastinum/Nodes: No enlarged mediastinal, hilar, or axillary lymph nodes. Thyroid gland, trachea, and esophagus demonstrate no significant findings. Lungs/Pleura: Interval resolution of focal loculated fluid overlying the paravertebral right lower lobe. Mild emphysema. The spiculated nodule within the posterior right lower lobe measures 6 mm, image 77/8. Unchanged when compared with the previous exam. New tree-in-bud nodularity clustered in the posteromedial right lower lobe, image 86/4. New infiltrative changes  within the superior segment of the left lower lobe is identified, image 79/8. Within the periphery of the left lower lobe there is a new cluster of tree-in-bud nodularity, image 112/8. Within the medial left lung base there is a round lung nodule which is new from previous exam possibly representing an area of mucoid impaction. This measures 6 mm, image 119/8. Musculoskeletal: No acute osseous findings. Similar appearance of mild sclerosis involving the T11 vertebra, image 121/7. Equivocal for treated bone metastases. There is no new or suspicious bone lesions. CT ABDOMEN PELVIS FINDINGS Hepatobiliary: No focal liver abnormality is seen. No gallstones, gallbladder wall thickening, or biliary dilatation. Pancreas: The pancreatic parenchyma appears severely atrophic with diffuse pancreatic ductal dilatation with large dense calcification in the pancreatic head. Diffuse coarsened calcifications are identified within the pancreatic parenchyma of the body and tail. Spleen: Normal in size without focal abnormality. Adrenals/Urinary Tract: Low-density nodule in the right adrenal gland is unchanged measuring 2.0 cm and is compatible with a benign adenoma. Unchanged from prior study. Normal left adrenal gland. Nonobstructing stone within upper pole of right kidney. Multiple right kidney cysts are identified. The largest measures 3.4 cm, image 58/2. No follow-up imaging recommended. Mildly complex cyst off the inferior pole of the left kidney is unchanged measuring 1.1 cm, image 75/2. No follow-up imaging recommended. Urinary bladder is partially decompressed. No focal abnormality noted. Stomach/Bowel: Stomach is within normal limits. Appendix appears normal. There is a large right inguinal hernia which contains several loops of small bowel. These bowel loops measure up to 2.7 cm in diameter. No signs of high-grade bowel obstruction. Moderate retained stool within the colon. Vascular/Lymphatic: Aortic atherosclerosis. No  signs of aneurysm. No signs of abdominopelvic adenopathy. IVC filter is identified. Reproductive: Prostate gland enlargement. Other: No free fluid or fluid collections. No signs of pneumoperitoneum. Musculoskeletal: There are few small ill-defined sclerotic foci within the bony pelvis and lumbar spine which appear unchanged from previous exam and may reflect treated bone metastasis. Inferior endplate compression deformity is identified involving the L4 vertebra. Loss of approximately 20% of the inferior vertebral body height is noted. This appears stable from previous exam. IMPRESSION: 1. Interval resolution of focal loculated fluid overlying the paravertebral right lower lobe. 2. New infiltrative changes within the superior segment of the left lower lobe and new clusters of tree-in-bud nodularity within the bilateral lower lobes. Findings are favored to represent an infectious or inflammatory process. Consider follow-up imaging with repeat CT of the chest in 3 months to ensure resolution. 3. New 6 mm round lung nodule within the medial left lung base possibly representing an area of mucoid impaction. 4. Stable appearance of spiculated nodule within the posterior right lower lobe. 5. Stable appearance of ill-defined sclerotic foci within the bony pelvis and lumbar spine which may reflect treated bone metastasis. 6. Large right inguinal hernia which contains several loops  of small bowel. Bowel loops appear mildly increased in caliber when compared with the previous exam without signs to suggest high-grade bowel obstruction. Clinical correlation advised. 7. Stable appearance of right adrenal adenoma. 8. Nonobstructing right renal calculus. 9. Coronary artery calcifications. 10. Aortic Atherosclerosis (ICD10-I70.0) and Emphysema (ICD10-J43.9). Electronically Signed   By: Signa Kell M.D.   On: 09/23/2022 12:55    ASSESSMENT AND PLAN: This is a very pleasant 82 years old [place] male diagnosed with Stage IV (T1b,  N3, M1 C) non-small cell lung cancer, adenocarcinoma with positive ALK gene translocation in February 2023. The patient is currently undergoing treatment with Alecensa (Alectinib) 600 mg p.o. twice daily.  He has been tolerating the treatment well with no concerning complaints.  He is status post 17.5 months of treatment.   He has been tolerating his treatment with Alecensa (Alectinib) fairly well with no concerning adverse effects. He had repeat CT scan of the chest, abdomen and pelvis performed recently.  I personally and independently reviewed the scan and discussed the result with the patient and his family. His scan showed no concerning findings for disease progression.  There was new infiltrative changes within the superior segment of the left lower lobe and new clusters of tree-in-bud nodularity within the bilateral lower lobes suspicious for infectious/inflammatory process requiring close follow-up.  There was also a new 0.6 cm round lung nodule in the medial left lung base suspicious for area of mucoid impaction. I recommended for the patient to continue his current treatment with Alecensa (Alectinib) with the same dose. I will see the patient back for follow-up visit in 2 months for evaluation and repeat blood work. I will see him back for follow-up visit in 2 months for evaluation and repeat blood work as well as imaging studies. For the anemia, likely combination of anemia of chronic disease, iron deficiency and hemolytic anemia.  He will continue his current treatment with fusion plus and I advised him to also take multivitamins and B12 supplement orally. For the history of deep venous thrombosis, he will continue his current treatment with Eliquis 2.5 mg p.o. twice daily. He was advised to call immediately if he has any other concerning symptoms in the interval. I discussed the assessment and treatment plan with the patient. The patient was provided an opportunity to ask questions and all  were answered. The patient agreed with the plan and demonstrated an understanding of the instructions.   The patient was advised to call back or seek an in-person evaluation if the symptoms worsen or if the condition fails to improve as anticipated.  I provided 25 minutes of non face-to-face telephone visit time during this encounter, and > 50% was spent counseling as documented under my assessment & plan.  Lajuana Matte, MD 09/24/2022 8:08 AM  Disclaimer: This note was dictated with voice recognition software. Similar sounding words can inadvertently be transcribed and may not be corrected upon review.

## 2022-09-25 ENCOUNTER — Ambulatory Visit: Payer: 59

## 2022-09-26 ENCOUNTER — Other Ambulatory Visit (HOSPITAL_COMMUNITY): Payer: Self-pay

## 2022-09-27 ENCOUNTER — Ambulatory Visit: Payer: 59

## 2022-09-27 NOTE — Telephone Encounter (Signed)
No specific medications I know of to prescribe without seeing patient first Will need to cancel PM appointment  Thanks Latrelle Dodrill, MD

## 2022-09-27 NOTE — Telephone Encounter (Signed)
RN team,  Can you reach out to this patient? Dr. Deirdre Priest is out this week but perhaps we have an opening he could be seen in?  Thanks Latrelle Dodrill, MD

## 2022-10-02 ENCOUNTER — Other Ambulatory Visit (HOSPITAL_COMMUNITY): Payer: Self-pay

## 2022-10-03 ENCOUNTER — Ambulatory Visit (INDEPENDENT_AMBULATORY_CARE_PROVIDER_SITE_OTHER): Payer: 59 | Admitting: Student

## 2022-10-03 VITALS — BP 135/64 | HR 87 | Ht 71.0 in

## 2022-10-03 DIAGNOSIS — J189 Pneumonia, unspecified organism: Secondary | ICD-10-CM

## 2022-10-03 MED ORDER — AMOXICILLIN-POT CLAVULANATE 875-125 MG PO TABS
1.0000 | ORAL_TABLET | Freq: Two times a day (BID) | ORAL | 0 refills | Status: DC
Start: 2022-10-03 — End: 2022-10-08

## 2022-10-03 NOTE — Assessment & Plan Note (Signed)
With prolonged course of cough and signs of infection on prior recent CT scan ,suspect component of atypical pneumonia.  Will elect to treat with Augmentin for 7 days.  With [place] electively treating with antibiotics will defer new chest imaging.  Can consider repeat CT or chest x-ray if patient has not improved after antibiotic course.  Will also instruct patient to try over-the-counter cough medicine Coricidin to see if this helps manage his symptoms.  With new nodule found on lung CT would recommend he have closer follow-up with oncologist.  He is scheduled to see PCP on 9/10 and recommended he keep this appointment for follow-up.

## 2022-10-03 NOTE — Patient Instructions (Addendum)
I am prescribing Augmentin to treat pneumonia.   You should try Corcidin Cough medicine to help treat the persistent cough.   I would like for you to follow up with your Dr. Arbutus Ped prior to the end of October - please call and see if you can get an earlier appointment.     Future Appointments  Date Time Provider Department Center  10/08/2022 10:10 AM Carney Living, MD [facility]  11/20/2022  9:45 AM [facility] None  11/20/2022 10:15 AM Si Gaul, MD [facility] None    Please arrive 15 minutes before your appointment to ensure smooth check in process.    Please call the clinic at [phone] if your symptoms worsen or you have any concerns.  Thank you for allowing me to participate in your care, Dr. Glendale Chard [facility]

## 2022-10-03 NOTE — Progress Notes (Signed)
    SUBJECTIVE:   CHIEF COMPLAINT / HPI:   Arthur [name] is a 82 y.o. male  presenting for persistent cough present for 2 months which has been progressing in severity.  Son reports his dad coughing all throughout the night only getting 1 to 2 hours of sleep due to the amount of coughing.  Cough is productive with yellow and white sputum.  We have tried Robitussin and Tessalon Perles without significant relief.  He now has left rib pain from coughing.  They have been managing it with Voltaren gel as needed at home.  He has been afebrile but has been more fatigued than usual.  He was recently seen by his oncologist Dr. Shirline Frees and had a CT of his chest abdomen pelvis which showed possible new inflammation versus infection in the superior segment of the left lower lobe.  He is scheduled for follow-up with oncology at the end of October.  PERTINENT  PMH / PSH: Reviewed and updated   OBJECTIVE:   BP 135/64   Pulse 87   Ht 5\' 11"  (1.803 m)   SpO2 95%   BMI 22.52 kg/m   Chronically ill-appearing, no acute distress Cardio: Regular rate, regular rhythm, no murmurs on exam. Pulm: No increased work of breathing, productive cough noticed on exam, diffuse rhonchi on the left with diminished breath sounds on the right with diffuse crackles. Abdominal: bowel sounds present, soft, non-tender, non-distended Extremities: no peripheral edema       10/03/2022    2:37 PM 12/18/2021    9:35 AM 08/13/2021    9:18 AM  PHQ9 SCORE ONLY  PHQ-9 Total Score 0 0 0      ASSESSMENT/PLAN:   Atypical pneumonia With prolonged course of cough and signs of infection on prior recent CT scan ,suspect component of atypical pneumonia.  Will elect to treat with Augmentin for 7 days.  With [place] electively treating with antibiotics will defer new chest imaging.  Can consider repeat CT or chest x-ray if patient has not improved after antibiotic course.  Will also instruct patient to try over-the-counter cough medicine  Coricidin to see if this helps manage his symptoms.  With new nodule found on lung CT would recommend he have closer follow-up with oncologist.  He is scheduled to see PCP on 9/10 and recommended he keep this appointment for follow-up.      Glendale Chard, DO [facility]

## 2022-10-08 ENCOUNTER — Other Ambulatory Visit: Payer: Self-pay

## 2022-10-08 ENCOUNTER — Encounter: Payer: Self-pay | Admitting: Family Medicine

## 2022-10-08 ENCOUNTER — Ambulatory Visit (INDEPENDENT_AMBULATORY_CARE_PROVIDER_SITE_OTHER): Payer: 59 | Admitting: Family Medicine

## 2022-10-08 VITALS — BP 132/55 | HR 84 | Ht 71.0 in | Wt 157.2 lb

## 2022-10-08 DIAGNOSIS — I2510 Atherosclerotic heart disease of native coronary artery without angina pectoris: Secondary | ICD-10-CM

## 2022-10-08 DIAGNOSIS — I2699 Other pulmonary embolism without acute cor pulmonale: Secondary | ICD-10-CM | POA: Diagnosis not present

## 2022-10-08 DIAGNOSIS — C3490 Malignant neoplasm of unspecified part of unspecified bronchus or lung: Secondary | ICD-10-CM | POA: Diagnosis not present

## 2022-10-08 DIAGNOSIS — J189 Pneumonia, unspecified organism: Secondary | ICD-10-CM

## 2022-10-08 DIAGNOSIS — R058 Other specified cough: Secondary | ICD-10-CM

## 2022-10-08 DIAGNOSIS — D649 Anemia, unspecified: Secondary | ICD-10-CM | POA: Diagnosis not present

## 2022-10-08 DIAGNOSIS — K5900 Constipation, unspecified: Secondary | ICD-10-CM

## 2022-10-08 MED ORDER — LINACLOTIDE 72 MCG PO CAPS: ORAL_CAPSULE | ORAL | 2 refills | Status: DC

## 2022-10-08 NOTE — Progress Notes (Signed)
    SUBJECTIVE:   CHIEF COMPLAINT / HPI:   Cough Seen by Dr Hyacinth Meeker on 9/5 started Augmentin for possible bacterial bronchitis Chest CT end of August showed new findings. Today he feels the cough is improved.  Less sputum and much less bothersome.  No fever or chest pain or dyspnea with exertion.  CAD Brings in all his medications.  List updated.  Anemia Taking eliquis low dose and plavix.  No bleeding or lightheadness or chest pain.  Taking vitamins   OBJECTIVE:   BP (!) 132/55   Pulse 84   Ht 5\' 11"  (1.803 m)   Wt 157 lb 3.2 oz (71.3 kg)   SpO2 95%   BMI 21.92 kg/m   Good spirits in WC Lungs - no wheeze or rales or dullness Heart - Regular rate and rhythm.  No murmurs, gallops or rubs.    Extrem - 1+ edema at sock line R inguinal Hernia - nontender   ASSESSMENT/PLAN:   Pulmonary embolism, other, unspecified chronicity, unspecified whether acute cor pulmonale present [facility]) Assessment & Plan: No signs of recurrence but remains at risk with immobility and cancer.  Continue eliquis    Coronary artery disease involving native coronary artery of native heart without angina pectoris Assessment & Plan: No angina on plavix and rosuvastatin    Non-small cell carcinoma of lung, stage 4, unspecified laterality [facility]) Assessment & Plan: Concerning for August CT changes.  Patient has follow up with oncology.    Atypical pneumonia Assessment & Plan: Cough has improved with starting the antibiotics.  He will finish these.  Continue to monitor   Constipation, unspecified constipation type Assessment & Plan: Seems related to reduced activity.  Feels linzess 140 mg is too strong.  Will reduce to 75 mg as needed    Other cough  Symptomatic anemia Assessment & Plan: Multifactorial being followed by Dr Shirline Frees.  No signs of acute worsening today    Other orders -     linaCLOtide; Take daily or every other day as needed  Dispense: 30 capsule; Refill: 2     Patient  Instructions  Good to see you today - Thank you for coming in  Things we discussed today:  Cough Glad you are better Let me know if any problems Follow up with Dr Shirline Frees   Try the lower dose of Linzess  Please always bring your medication bottles  Come back to see me in 3 months    Carney Living, MD [facility]

## 2022-10-08 NOTE — Assessment & Plan Note (Signed)
No signs of recurrence but remains at risk with immobility and cancer.  Continue eliquis

## 2022-10-08 NOTE — Assessment & Plan Note (Signed)
Seems related to reduced activity.  Feels linzess 140 mg is too strong.  Will reduce to 75 mg as needed

## 2022-10-08 NOTE — Assessment & Plan Note (Signed)
Multifactorial being followed by Dr Shirline Frees.  No signs of acute worsening today

## 2022-10-08 NOTE — Assessment & Plan Note (Signed)
Concerning for August CT changes.  Patient has follow up with oncology.

## 2022-10-08 NOTE — Assessment & Plan Note (Signed)
No angina on plavix and rosuvastatin

## 2022-10-08 NOTE — Assessment & Plan Note (Signed)
Cough has improved with starting the antibiotics.  He will finish these.  Continue to monitor

## 2022-10-08 NOTE — Patient Instructions (Addendum)
Good to see you today - Thank you for coming in  Things we discussed today:  Cough Glad you are better Let me know if any problems Follow up with Dr Shirline Frees   Try the lower dose of Linzess  Please always bring your medication bottles  Come back to see me in 3 months

## 2022-10-14 ENCOUNTER — Other Ambulatory Visit: Payer: Self-pay | Admitting: Family Medicine

## 2022-10-14 DIAGNOSIS — I10 Essential (primary) hypertension: Secondary | ICD-10-CM

## 2022-10-14 DIAGNOSIS — D649 Anemia, unspecified: Secondary | ICD-10-CM

## 2022-10-15 ENCOUNTER — Other Ambulatory Visit: Payer: Self-pay

## 2022-10-15 ENCOUNTER — Telehealth: Payer: Self-pay | Admitting: Student

## 2022-10-15 ENCOUNTER — Inpatient Hospital Stay (HOSPITAL_COMMUNITY)
Admission: EM | Admit: 2022-10-15 | Discharge: 2022-10-20 | DRG: 378 | Disposition: A | Discharge status: Home Health | Payer: 59 | Attending: Family Medicine | Admitting: Family Medicine

## 2022-10-15 ENCOUNTER — Other Ambulatory Visit: Payer: 59

## 2022-10-15 DIAGNOSIS — Z7901 Long term (current) use of anticoagulants: Secondary | ICD-10-CM

## 2022-10-15 DIAGNOSIS — D649 Anemia, unspecified: Principal | ICD-10-CM | POA: Diagnosis present

## 2022-10-15 DIAGNOSIS — K264 Chronic or unspecified duodenal ulcer with hemorrhage: Secondary | ICD-10-CM | POA: Diagnosis not present

## 2022-10-15 DIAGNOSIS — K922 Gastrointestinal hemorrhage, unspecified: Secondary | ICD-10-CM | POA: Diagnosis present

## 2022-10-15 DIAGNOSIS — D500 Iron deficiency anemia secondary to blood loss (chronic): Secondary | ICD-10-CM | POA: Diagnosis not present

## 2022-10-15 DIAGNOSIS — K5711 Diverticulosis of small intestine without perforation or abscess with bleeding: Secondary | ICD-10-CM | POA: Diagnosis not present

## 2022-10-15 DIAGNOSIS — Z79899 Other long term (current) drug therapy: Secondary | ICD-10-CM | POA: Diagnosis not present

## 2022-10-15 DIAGNOSIS — E119 Type 2 diabetes mellitus without complications: Secondary | ICD-10-CM | POA: Diagnosis not present

## 2022-10-15 DIAGNOSIS — Z86711 Personal history of pulmonary embolism: Secondary | ICD-10-CM

## 2022-10-15 DIAGNOSIS — D638 Anemia in other chronic diseases classified elsewhere: Secondary | ICD-10-CM | POA: Diagnosis not present

## 2022-10-15 DIAGNOSIS — K5900 Constipation, unspecified: Secondary | ICD-10-CM | POA: Diagnosis not present

## 2022-10-15 DIAGNOSIS — Z8673 Personal history of transient ischemic attack (TIA), and cerebral infarction without residual deficits: Secondary | ICD-10-CM

## 2022-10-15 DIAGNOSIS — Z7902 Long term (current) use of antithrombotics/antiplatelets: Secondary | ICD-10-CM

## 2022-10-15 DIAGNOSIS — I10 Essential (primary) hypertension: Secondary | ICD-10-CM | POA: Diagnosis present

## 2022-10-15 DIAGNOSIS — C3490 Malignant neoplasm of unspecified part of unspecified bronchus or lung: Secondary | ICD-10-CM | POA: Diagnosis present

## 2022-10-15 DIAGNOSIS — K269 Duodenal ulcer, unspecified as acute or chronic, without hemorrhage or perforation: Secondary | ICD-10-CM | POA: Diagnosis present

## 2022-10-15 DIAGNOSIS — Z9842 Cataract extraction status, left eye: Secondary | ICD-10-CM

## 2022-10-15 DIAGNOSIS — Z8249 Family history of ischemic heart disease and other diseases of the circulatory system: Secondary | ICD-10-CM | POA: Diagnosis not present

## 2022-10-15 DIAGNOSIS — E7800 Pure hypercholesterolemia, unspecified: Secondary | ICD-10-CM | POA: Diagnosis not present

## 2022-10-15 DIAGNOSIS — I2510 Atherosclerotic heart disease of native coronary artery without angina pectoris: Secondary | ICD-10-CM | POA: Diagnosis not present

## 2022-10-15 DIAGNOSIS — Z95828 Presence of other vascular implants and grafts: Secondary | ICD-10-CM

## 2022-10-15 DIAGNOSIS — E1169 Type 2 diabetes mellitus with other specified complication: Secondary | ICD-10-CM | POA: Diagnosis not present

## 2022-10-15 DIAGNOSIS — I2583 Coronary atherosclerosis due to lipid rich plaque: Secondary | ICD-10-CM | POA: Diagnosis present

## 2022-10-15 DIAGNOSIS — Z86718 Personal history of other venous thrombosis and embolism: Secondary | ICD-10-CM

## 2022-10-15 DIAGNOSIS — Z955 Presence of coronary angioplasty implant and graft: Secondary | ICD-10-CM | POA: Diagnosis not present

## 2022-10-15 DIAGNOSIS — D6832 Hemorrhagic disorder due to extrinsic circulating anticoagulants: Secondary | ICD-10-CM | POA: Diagnosis present

## 2022-10-15 DIAGNOSIS — Z888 Allergy status to other drugs, medicaments and biological substances status: Secondary | ICD-10-CM

## 2022-10-15 DIAGNOSIS — C3491 Malignant neoplasm of unspecified part of right bronchus or lung: Secondary | ICD-10-CM | POA: Diagnosis present

## 2022-10-15 DIAGNOSIS — K2951 Unspecified chronic gastritis with bleeding: Secondary | ICD-10-CM | POA: Diagnosis not present

## 2022-10-15 DIAGNOSIS — R54 Age-related physical debility: Secondary | ICD-10-CM

## 2022-10-15 DIAGNOSIS — Z9841 Cataract extraction status, right eye: Secondary | ICD-10-CM

## 2022-10-15 DIAGNOSIS — I480 Paroxysmal atrial fibrillation: Secondary | ICD-10-CM | POA: Diagnosis not present

## 2022-10-15 DIAGNOSIS — R195 Other fecal abnormalities: Secondary | ICD-10-CM | POA: Diagnosis not present

## 2022-10-15 DIAGNOSIS — D509 Iron deficiency anemia, unspecified: Secondary | ICD-10-CM | POA: Diagnosis not present

## 2022-10-15 DIAGNOSIS — R17 Unspecified jaundice: Secondary | ICD-10-CM | POA: Diagnosis not present

## 2022-10-15 DIAGNOSIS — T45515A Adverse effect of anticoagulants, initial encounter: Secondary | ICD-10-CM | POA: Diagnosis present

## 2022-10-15 LAB — PROTIME-INR
INR: 1.4 — ABNORMAL HIGH (ref 0.8–1.2)
Prothrombin Time: 17 s — ABNORMAL HIGH (ref 11.4–15.2)

## 2022-10-15 NOTE — Telephone Encounter (Signed)
Received page that patient had a critically low value. Page was lost before provider could call back, but upon chart review, Patient was noted to have a Hg of 5.1.   Called patient's son at listed number and confirmed birth date. He note's that his father is doing well and sleeping, with no complaints of bleeding or bruising. He note's that they cannot find the source of his bleeding.   I discussed that his hgb is critically low and this is dangerous. Told the son, that patient need's a blood transfusion and may be admitted for this. The son asked if this could wait to the morning, but I expressed the urgency and danger of the situation. The son noted that they would take the patient to [facility] to be seen.

## 2022-10-15 NOTE — ED Triage Notes (Addendum)
Patient received a call from his PCP this evening advised him to go to ER for blood transfusion due to low hemoglobin test result taken this morning at clinic . He adds generalized weakness/fatigue this week . Denies hematuria or hematochezia/melena.

## 2022-10-16 ENCOUNTER — Encounter (HOSPITAL_COMMUNITY): Payer: Self-pay | Admitting: Family Medicine

## 2022-10-16 ENCOUNTER — Other Ambulatory Visit: Payer: Self-pay

## 2022-10-16 DIAGNOSIS — K2951 Unspecified chronic gastritis with bleeding: Secondary | ICD-10-CM | POA: Diagnosis present

## 2022-10-16 DIAGNOSIS — Z7902 Long term (current) use of antithrombotics/antiplatelets: Secondary | ICD-10-CM | POA: Diagnosis not present

## 2022-10-16 DIAGNOSIS — D638 Anemia in other chronic diseases classified elsewhere: Secondary | ICD-10-CM | POA: Diagnosis present

## 2022-10-16 DIAGNOSIS — D649 Anemia, unspecified: Secondary | ICD-10-CM | POA: Diagnosis not present

## 2022-10-16 DIAGNOSIS — E1169 Type 2 diabetes mellitus with other specified complication: Secondary | ICD-10-CM | POA: Diagnosis not present

## 2022-10-16 DIAGNOSIS — C3490 Malignant neoplasm of unspecified part of unspecified bronchus or lung: Secondary | ICD-10-CM

## 2022-10-16 DIAGNOSIS — Z8673 Personal history of transient ischemic attack (TIA), and cerebral infarction without residual deficits: Secondary | ICD-10-CM | POA: Diagnosis not present

## 2022-10-16 DIAGNOSIS — K922 Gastrointestinal hemorrhage, unspecified: Secondary | ICD-10-CM

## 2022-10-16 DIAGNOSIS — D500 Iron deficiency anemia secondary to blood loss (chronic): Secondary | ICD-10-CM | POA: Diagnosis present

## 2022-10-16 DIAGNOSIS — C3491 Malignant neoplasm of unspecified part of right bronchus or lung: Secondary | ICD-10-CM | POA: Diagnosis present

## 2022-10-16 DIAGNOSIS — K5900 Constipation, unspecified: Secondary | ICD-10-CM | POA: Diagnosis present

## 2022-10-16 DIAGNOSIS — Z8249 Family history of ischemic heart disease and other diseases of the circulatory system: Secondary | ICD-10-CM | POA: Diagnosis not present

## 2022-10-16 DIAGNOSIS — Z86718 Personal history of other venous thrombosis and embolism: Secondary | ICD-10-CM | POA: Diagnosis not present

## 2022-10-16 DIAGNOSIS — E7800 Pure hypercholesterolemia, unspecified: Secondary | ICD-10-CM | POA: Diagnosis present

## 2022-10-16 DIAGNOSIS — Z7901 Long term (current) use of anticoagulants: Secondary | ICD-10-CM | POA: Diagnosis not present

## 2022-10-16 DIAGNOSIS — E119 Type 2 diabetes mellitus without complications: Secondary | ICD-10-CM | POA: Diagnosis present

## 2022-10-16 DIAGNOSIS — R17 Unspecified jaundice: Secondary | ICD-10-CM | POA: Diagnosis present

## 2022-10-16 DIAGNOSIS — R195 Other fecal abnormalities: Secondary | ICD-10-CM | POA: Diagnosis not present

## 2022-10-16 DIAGNOSIS — I10 Essential (primary) hypertension: Secondary | ICD-10-CM | POA: Diagnosis present

## 2022-10-16 DIAGNOSIS — Z9841 Cataract extraction status, right eye: Secondary | ICD-10-CM | POA: Diagnosis not present

## 2022-10-16 DIAGNOSIS — Z79899 Other long term (current) drug therapy: Secondary | ICD-10-CM | POA: Diagnosis not present

## 2022-10-16 DIAGNOSIS — Z86711 Personal history of pulmonary embolism: Secondary | ICD-10-CM | POA: Diagnosis not present

## 2022-10-16 DIAGNOSIS — K269 Duodenal ulcer, unspecified as acute or chronic, without hemorrhage or perforation: Secondary | ICD-10-CM | POA: Diagnosis not present

## 2022-10-16 DIAGNOSIS — Z95828 Presence of other vascular implants and grafts: Secondary | ICD-10-CM | POA: Diagnosis not present

## 2022-10-16 DIAGNOSIS — K5710 Diverticulosis of small intestine without perforation or abscess without bleeding: Secondary | ICD-10-CM | POA: Diagnosis not present

## 2022-10-16 DIAGNOSIS — K264 Chronic or unspecified duodenal ulcer with hemorrhage: Secondary | ICD-10-CM | POA: Diagnosis present

## 2022-10-16 DIAGNOSIS — E785 Hyperlipidemia, unspecified: Secondary | ICD-10-CM | POA: Diagnosis not present

## 2022-10-16 DIAGNOSIS — I2583 Coronary atherosclerosis due to lipid rich plaque: Secondary | ICD-10-CM | POA: Diagnosis present

## 2022-10-16 DIAGNOSIS — D6832 Hemorrhagic disorder due to extrinsic circulating anticoagulants: Secondary | ICD-10-CM | POA: Diagnosis present

## 2022-10-16 DIAGNOSIS — K2980 Duodenitis without bleeding: Secondary | ICD-10-CM | POA: Diagnosis not present

## 2022-10-16 DIAGNOSIS — K5711 Diverticulosis of small intestine without perforation or abscess with bleeding: Secondary | ICD-10-CM | POA: Diagnosis present

## 2022-10-16 DIAGNOSIS — I480 Paroxysmal atrial fibrillation: Secondary | ICD-10-CM | POA: Diagnosis present

## 2022-10-16 DIAGNOSIS — K2950 Unspecified chronic gastritis without bleeding: Secondary | ICD-10-CM | POA: Diagnosis not present

## 2022-10-16 DIAGNOSIS — D509 Iron deficiency anemia, unspecified: Secondary | ICD-10-CM | POA: Diagnosis not present

## 2022-10-16 DIAGNOSIS — Z955 Presence of coronary angioplasty implant and graft: Secondary | ICD-10-CM | POA: Diagnosis not present

## 2022-10-16 DIAGNOSIS — I2510 Atherosclerotic heart disease of native coronary artery without angina pectoris: Secondary | ICD-10-CM | POA: Diagnosis present

## 2022-10-16 LAB — CBC
HCT: 25.7 % — ABNORMAL LOW (ref 39.0–52.0)
Hemoglobin: 8.3 g/dL — ABNORMAL LOW (ref 13.0–17.0)
MCH: 31.1 pg (ref 26.0–34.0)
MCHC: 32.3 g/dL (ref 30.0–36.0)
MCV: 96.3 fL (ref 80.0–100.0)
Platelets: 217 10*3/uL (ref 150–400)
RBC: 2.67 MIL/uL — ABNORMAL LOW (ref 4.22–5.81)
RDW: 19.4 % — ABNORMAL HIGH (ref 11.5–15.5)
WBC: 9.4 10*3/uL (ref 4.0–10.5)
nRBC: 5 % — ABNORMAL HIGH (ref 0.0–0.2)

## 2022-10-16 LAB — POC OCCULT BLOOD, ED: Fecal Occult Bld: POSITIVE — AB

## 2022-10-16 LAB — PREPARE RBC (CROSSMATCH)

## 2022-10-16 LAB — HEMOGLOBIN AND HEMATOCRIT, BLOOD
HCT: 26.2 % — ABNORMAL LOW (ref 39.0–52.0)
Hemoglobin: 8.6 g/dL — ABNORMAL LOW (ref 13.0–17.0)

## 2022-10-16 MED ORDER — ALECTINIB HCL 150 MG PO CAPS
600.0000 mg | ORAL_CAPSULE | Freq: Two times a day (BID) | ORAL | Status: DC
  Administered 2022-10-16 – 2022-10-20 (×8): 600 mg via ORAL
  Filled 2022-10-16 (×11): qty 4

## 2022-10-16 MED ORDER — PANTOPRAZOLE SODIUM 40 MG IV SOLR
40.0000 mg | Freq: Two times a day (BID) | INTRAVENOUS | Status: DC
  Administered 2022-10-16 (×2): 40 mg via INTRAVENOUS
  Filled 2022-10-16 (×2): qty 10

## 2022-10-16 MED ORDER — LINACLOTIDE 72 MCG PO CAPS: 72.0000 ug | ORAL_CAPSULE | ORAL | Status: DC | PRN

## 2022-10-16 MED ORDER — ROSUVASTATIN CALCIUM 5 MG PO TABS
5.0000 mg | ORAL_TABLET | Freq: Every day | ORAL | Status: DC
  Administered 2022-10-17 – 2022-10-20 (×4): 5 mg via ORAL
  Filled 2022-10-16 (×4): qty 1

## 2022-10-16 MED ORDER — FUROSEMIDE 20 MG PO TABS: 20.0000 mg | ORAL_TABLET | Freq: Every day | ORAL | Status: DC | PRN

## 2022-10-16 MED ORDER — SODIUM CHLORIDE 0.9% IV SOLUTION: Freq: Once | INTRAVENOUS | Status: AC

## 2022-10-16 MED ORDER — PANTOPRAZOLE SODIUM 40 MG IV SOLR
40.0000 mg | Freq: Once | INTRAVENOUS | Status: AC
  Administered 2022-10-16: 40 mg via INTRAVENOUS
  Filled 2022-10-16: qty 10

## 2022-10-16 MED ORDER — PEG-KCL-NACL-NASULF-NA ASC-C 100 G PO SOLR
0.5000 | Freq: Once | ORAL | Status: AC
  Administered 2022-10-16: 100 g via ORAL
  Filled 2022-10-16: qty 1

## 2022-10-16 MED ORDER — FE FUM-VIT C-VIT B12-FA 460-60-0.01-1 MG PO CAPS
1.0000 | ORAL_CAPSULE | Freq: Every day | ORAL | Status: DC
  Administered 2022-10-16 – 2022-10-20 (×5): 1 via ORAL
  Filled 2022-10-16 (×5): qty 1

## 2022-10-16 NOTE — ED Notes (Signed)
Report received from Margo Aye RN. Assumed care of pt at this time.

## 2022-10-16 NOTE — Assessment & Plan Note (Addendum)
Patient comes in with weeklong symptoms of weakness and fatigue, had Hgb 4.8 on admission. Patient was also Hemoccult positive on admission.  Patient has history of anemia, multifactorial in nature (IDA, hemolytic, chronic disease).  Patient is followed by hematology oncology, last seen in August.  Patient is hemodynamically stable.  - Transfused 2 units PRBC - Posttransfusion H&H - AM CBC - GI following, appreciate recs

## 2022-10-16 NOTE — Progress Notes (Addendum)
FMTS Attending Daily Note: Burley Saver, MD  Team Pager [phone] Pager [phone] I have personally seen and examined this patient, reviewed their chart and results. I have discussed this patient with the resident. I agree with the resident's findings, assessment and care plan as documented in the resident's note, clarifications or changes to the resident's note are listed below.  See H&P cosign for thoughts from today. Addendums in note as below. Attempting to add on LDH/haptoglobin and smear from labs prior to his transfusion. No abdominal tenderness to palpation, + bowel sounds.       Daily Progress Note Intern Pager: [phone]  Patient name: Arthur [name] Medical record number: [id number] Date of birth: [birth year] Age: 82 y.o. Gender: male  Primary Care Provider: Carney Living, MD Consultants: GI, Oncology? Code Status: Full  Pt Overview and Major Events to Date:  Kristine R Hanish is a 82 y.o. male presenting with symptomatic anemia and positive hemoccult test. 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 Differential for presentation of this includes GI bleed, vs hemolytic anemia, vs IDA vs anemia of chronic disease, as patient has hx of anemia that is thought to be stemming from these possible modalities.    Assessment and Plan: Assessment & Plan Symptomatic anemia Patient comes in with weeklong symptoms of weakness and fatigue, had Hgb 4.8 on admission. Patient was also Hemoccult positive on admission.  Patient has history of anemia, multifactorial in nature (IDA, hemolytic, chronic disease).  Patient is followed by hematology oncology, last seen in August.  Patient is hemodynamically stable.  - Transfused 2 units PRBC - Posttransfusion H&H - AM CBC - GI following, appreciate recs  Gastrointestinal hemorrhage, unspecified gastrointestinal hemorrhage type Patient comes in for symptomatic anemia found to have positive Hemoccult test, concerning  for GI bleed.  GI has been consulted appreciate their recommedations.  - Protonix - GI following appreciate recs - N.p.o.   Chronic and Stable Problems:  Stage IV non-small cell carcinoma of lung - Alectinib 150mg  4 capsules BID, follows with [facility] CAD - Plavix 75mg  every day, held in setting of GI bleed Hx PE - Eliquis 2.5mg  BID, held in setting of GI bleed HLD - Rosuvastatin 5mg  every day DM2 - Diet Controlled    FEN/GI: NPO for endoscopy VTE Prophylaxis: SCD's  Subjective:  Pt is with his son laying down in a hospital bed in the ED. He is pleasant and alert. He is a good historian. He reports several weeks of weakness, dizziness. Was advised to come in by PCP and found to have HGB 5.1.   Objective: Temp:  [97.7 F (36.5 C)-98.7 F (37.1 C)] 97.9 F (36.6 C) (09/18 0717) Pulse Rate:  [68-93] 69 (09/18 0717) Resp:  [12-20] 14 (09/18 0717) BP: (103-140)/(41-74) 107/54 (09/18 0715) SpO2:  [95 %-100 %] 100 % (09/18 0717)  Physical Exam: General: Alert, conversant on exam. Looks chronically ill.  Cardiovascular: RRR, no murmurs rubs or gallops appreciated Respiratory: Pt not in current distress. Lung fields CTAB. Abdomen: Pt mildly tender to palpation on Left CVA. Otherwise NT/ND. No masses palpated. Extremities: No cyanosis, clubbing, or edema.  Laboratory: Most recent CBC Lab Results  Component Value Date   WBC 11.2 (H) 10/15/2022   HGB 4.8 (LL) 10/15/2022   HCT 16.5 (L) 10/15/2022   MCV 100.6 (H) 10/15/2022   PLT 233 10/15/2022   Most recent BMP    Latest Ref Rng & Units 10/15/2022  11:27 PM  BMP  Glucose 70 - 99 mg/dL 657   BUN 8 - 23 mg/dL 29   Creatinine 8.46 - 1.24 mg/dL 9.62   Sodium 952 - 841 mmol/L 137   Potassium 3.5 - 5.1 mmol/L 3.8   Chloride 98 - 111 mmol/L 104   CO2 22 - 32 mmol/L 23   Calcium 8.9 - 10.3 mg/dL 8.1    Other pertinent labs - will consider LDH, haptoglobin, and peripheral blood smear tomorrow to evaluate for hemolytic  anemia.   EKG: some concern for AFIB, pt is already on anticoagulation.   Imaging/Diagnostic Tests: No interval exams.  Margaretmary Dys, MD 10/16/2022, 7:39 AM  PGY-1, [facility] FPTS Intern pager: [phone], text pages welcome Secure chat group [facility] Teaching Service

## 2022-10-16 NOTE — Assessment & Plan Note (Addendum)
Patient comes in for symptomatic anemia found to have positive Hemoccult test, concerning for GI bleed.  GI has been consulted appreciate their recommedations.  - Protonix - GI following appreciate recs - N.p.o.

## 2022-10-16 NOTE — H&P (Addendum)
Hospital Admission History and Physical Service Pager: [phone]  Patient name: Arthur [name] Medical record number: [id number] Date of Birth: Dec 20, 1940 Age: 82 y.o. Gender: male  Primary Care Provider: Carney Living, MD Consultants: Gastroenterology Code Status: Full Code Preferred Emergency Contact:  Contact Information     Name Relation Home Work Mobile   Delair,Vipul Son [phone]  [phone]      Other Contacts     Name Relation Home Work Mobile   Lowe,[name] Daughter [phone]  [phone]   Helbert,Shital Daughter   [phone]        Chief Complaint: symptomatic anemia  Assessment and Plan: Arthur [name] is a 82 y.o. male presenting with symptomatic anemia and positive hemoccult test. Differential for presentation of this includes GI bleed, vs hemolytic anemia, vs IDA vs anemia of chronic disease, as patient has hx of anemia that is thought to be stemming from these possible modalities.   Assessment & Plan Symptomatic anemia Patient comes in with weeklong symptoms of weakness and fatigue, found to have a hemoglobin of 4.8 on admission.  Patient last hemoglobin was 9.9 nearly a month ago.  Patient was also Hemoccult positive on admission.  Patient has history of anemia, thought to be secondary to a combination of IDA, hemolytic anemia, anemia of chronic disease.  Patient is followed by hematology oncology, last seen in August.  Patient is hemodynamically stable. - Admit to FPTS, attending Dr. Miquel Dunn - Transfused 2 units PRBC - Posttransfusion H&H - AM CBC - GI following, appreciate recs - Fall precautions Gastrointestinal hemorrhage, unspecified gastrointestinal hemorrhage type Patient comes in for symptomatic anemia found to have positive Hemoccult test, concerning for GI bleed.  GI has been consulted will follow-up in the morning. - Insert 2 large-bore IV - Protonix - GI following appreciate recs - N.p.o.   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 male presenting with symptomatic anemia. [name] son reports that patient had been complaining of fatigue and weakness for the last week, thus they went to the [facility] clinic for a lab draw, to check for anemia.  He was found to have a hemoglobin of 4.8, and instructed to come to [facility].    Patient does have a history of anemia and follows with Dr. Shirline Frees at [facility]. Anemia thought to be multifactorial-anemia of chronic disease, hemolytic anemia, and iron deficiency anemia.  He receives blood transfusion and takes an iron supplement every day.   In the ED, patient came in, was hemoccult positive, and transfused 2 units of PRBC.  GI was consulted and recommended admission for symptomatic anemia.  Review Of Systems: Per HPI with the following additions:   Pertinent Past Medical History: Anemia - multifactorial (ACD, hemolytic, IDA) Stage IV non-small cell carcinoma of lung CAD DM HLD HTN PE  Remainder reviewed in history tab and noted above   Pertinent Past Surgical History: Coronary stent 2023 Left heart cath 2023   Remainder reviewed in history tab.  Pertinent Social History: Tobacco use: No Alcohol use: no  Other Substance use: no  Lives with Son and daughter in law  Pertinent Family History: Mother - CAD Father - CAD  Remainder reviewed in history tab.   Important Outpatient Medications: Alectinib 150mg  4 capsules BID Plavix 75mg  every day Eliquis 2.5mg  BID Lasix 20mg  2-3 times weekly Iron Fusion Plus capsules every day Linzess every day  PRN Rosuvastatin 5mg  every day  Remainder reviewed in medication history.   Objective: BP (!) 140/49 (BP Location: Right Arm)   Pulse 86   Temp 98.7 F (37.1 C) (Oral)   Resp 20   SpO2 95%  Exam: General: NAD, elderly, frail, polite, conversant male Eyes: pale sclera ENTM: MMM Cardiovascular: RRR, NRMG, S1/S2 Respiratory: CTABL, no wheeze, rhales Gastrointestinal: NTTP, non-distended MSK: 1+ pitting edema in BLE   Labs:  CBC BMET  Recent Labs  Lab 10/15/22 2327  WBC 11.2*  HGB 4.8*  HCT 16.5*  PLT 233   Recent Labs  Lab 10/15/22 2327  NA 137  K 3.8  CL 104  CO2 23  BUN 29*  CREATININE 1.39*  GLUCOSE 146*  CALCIUM 8.1*    PTT/INR 17, 1.4 Hemoccult Positive  Imaging Studies Performed: none   FPTS Intern pager: [phone], text pages welcome Secure chat group [facility] Teaching Service

## 2022-10-16 NOTE — Hospital Course (Addendum)
Arthur [name] is a 82 y.o.male with a history of stage IV non-small cell carcinoma of lung, CAD, PE, HLD, HTN, PE, anemia who was admitted to the [facility] Medicine Teaching Service at [facility] for symptomatic anemia. His hospital course is detailed below:  Symptomatic Anemia Pt had labs drawn in clinic a few days ago, resulted in Hgb 5.1 so he was advised to come to the ED. In the ED, pt reported fatigue over the last few days and his Hgb was 4.8. Hemoccult testing was positive. Pt was given 2u PRBCs. His hemoglobin improved to 8.3. GI performed an EGD demonstrating a non-bleeding duodenal ulcer with no stigmata of bleeding, which likely may the source of his bleeding and non-bleeding duodenal diverticulum. Hemolysis labs demonstrated elevated bilirubin, normal LDH. They advised starting Protonix 40 mg BID for 8 weeks and then once daily indefinitely.   Chronic Anticoagulation  With patient's history of recurrent episodes of thromboembolic events on Eliquis, there is concern for failure of Eliquis. Hematology and Cardiology expressed concern that Eliquis/Xarelto may not be optimal given his Eliquis failure and the best option moving forward would be Lovenox, as it is more easily reversible than other anticoagulation agents even though he has had hx of hematomas on this in the past. Plavix resumed.  Paroxysmal Atrial Fibrillation New onset PAF was diagnosed during this admission and was not seen on previous Echo. Patient remained rate controlled (70-80s with soft blood pressures) upon admission and did not require rate controlling medications. Lovenox was initiated per cardiology and hematology recommendations.  Other chronic conditions were medically managed with home medications and formulary alternatives as necessary (CAD, HLD, HTN) Adenocarcinoma of R Lung, Stage 4: Follows with [facility], Alectinib 600 mg BID with meals T2DM: A1c 2 months ago qat 4.9. Diet controlled. Hx of PE: IVC filter in  place. Pt was on Elqiuis 2.5 mg, but seems to have failed Eliquis therapy, discontinued this admission and started on Lovenox. Hx of primary angioplasty w/ coronary stent: Stent in 2023. Continued Plavix and Rosuvastatin.  HTN: soft blood pressures on admission HLD: continue Rosuvastatin 5 mg daily Edema: continued Lasix 20 mg daily   Constipation: continue Linzess 72 mcg capsule daily or every other day  PCP Follow-up Recommendations: GI will consider EGD in 6-8 weeks (10/31-11/14) weeks to assess healing of duodenal ulcer if anemia doesn't improve

## 2022-10-16 NOTE — ED Provider Notes (Signed)
[place] EMERGENCY DEPARTMENT AT [facility] Provider Note   CSN: [id number] Arrival date & time: 10/15/22  2311     History  Chief Complaint  Patient presents with   Abnormal Blood Tests Results    Hgb= 5.1    Arthur [name] is a 82 y.o. male.  Patient with history of diabetes, hypertension, PE on Elliquis, CAD, stage IV lung cancer currently on oral chemotherapy presents today with concerns for anemia. Patient reportedly had been feeling fatigued for the past few days, went to his pcp and had blood drawn and was found to have a hemoglobin of 5.1. Patient reportedly has been through this before and has required admission for blood transfusion and notes that the cause of his anemia was deemed 'multifactorial,' no clear etiology was found. Patient states he feels similar to when he has required admission previously. Patient denies any pain. Denies hematochezia or melena.   The history is provided by the patient. No language interpreter was used.       Home Medications Prior to Admission medications   Medication Sig Start Date End Date Taking? Authorizing Provider  alectinib (ALECENSA) 150 MG capsule Take 4 capsules (600 mg total) by mouth 2 (two) times daily with a meal. 07/31/22   Si Gaul, MD  baclofen (LIORESAL) 10 MG tablet Take 1 tablet (10 mg total) by mouth daily. Patient not taking: Reported on 10/08/2022 08/05/22   Bess Kinds, MD  benzonatate (TESSALON) 100 MG capsule Take 1 capsule (100 mg total) by mouth 2 (two) times daily as needed for cough. Patient not taking: Reported on 10/08/2022 09/06/22   Tiffany Kocher, DO  cetirizine (ZYRTEC) 10 MG tablet Take 1 tablet (10 mg total) by mouth daily. 09/13/22   Carney Living, MD  clopidogrel (PLAVIX) 75 MG tablet Take 1 tablet by mouth once daily with breakfast 07/15/22   Carney Living, MD  ELIQUIS 2.5 MG TABS tablet Take 1 tablet by mouth twice daily 08/12/22   Si Gaul, MD  furosemide  (LASIX) 20 MG tablet Take 20 mg by mouth See admin instructions. Take 20 mg (1 tablet) by mouth two to three times weekly.    [provider]  Iron-FA-B Cmp-C-Biot-Probiotic (FUSION PLUS) CAPS Take 1 capsule by mouth daily. 09/13/21   [provider]  linaclotide Karlene Einstein) 72 MCG capsule Take daily or every other day as needed 10/08/22   Carney Living, MD  polyethylene glycol (MIRALAX / GLYCOLAX) 17 g packet Take 17 g by mouth 2 (two) times daily.    [provider]  rosuvastatin (CRESTOR) 5 MG tablet Take 1 tablet by mouth once daily 08/28/22   Carney Living, MD  senna (SENOKOT) 8.6 MG TABS tablet Take 1 tablet (8.6 mg total) by mouth daily. 08/08/22   Shelby Mattocks, DO  sodium chloride (OCEAN) 0.65 % SOLN nasal spray Place 1 spray into both nostrils as needed for congestion. 09/06/22   Tiffany Kocher, DO      Allergies    Ace inhibitors and Iohexol    Review of Systems   Review of Systems  Constitutional:  Positive for fatigue.  All other systems reviewed and are negative.   Physical Exam Updated Vital Signs BP (!) 140/49 (BP Location: Right Arm)   Pulse 86   Temp 98.7 F (37.1 C) (Oral)   Resp 20   SpO2 95%  Physical Exam Vitals and nursing note reviewed. Exam conducted with a chaperone present.  Constitutional:  General: He is not in acute distress.    Appearance: Normal appearance. He is normal weight. He is not ill-appearing, toxic-appearing or diaphoretic.  HENT:     Head: Normocephalic and atraumatic.  Cardiovascular:     Rate and Rhythm: Normal rate.  Pulmonary:     Effort: Pulmonary effort is normal. No respiratory distress.  Abdominal:     General: Abdomen is flat.     Palpations: Abdomen is soft.     Tenderness: There is no abdominal tenderness.  Genitourinary:    Comments: No obvious melena or hematochezia on exam Musculoskeletal:        General: Normal range of motion.     Cervical back: Normal range of motion.   Skin:    General: Skin is warm and dry.  Neurological:     General: No focal deficit present.     Mental Status: He is alert.  Psychiatric:        Mood and Affect: Mood normal.        Behavior: Behavior normal.     ED Results / Procedures / Treatments   Labs (all labs ordered are listed, but only abnormal results are displayed) Labs Reviewed  CBC WITH DIFFERENTIAL/PLATELET - Abnormal; Notable for the following components:      Result Value   WBC 11.2 (*)    RBC 1.64 (*)    Hemoglobin 4.8 (*)    HCT 16.5 (*)    MCV 100.6 (*)    MCHC 29.1 (*)    RDW 23.5 (*)    nRBC 5.9 (*)    Abs Immature Granulocytes 0.29 (*)    All other components within normal limits  COMPREHENSIVE METABOLIC PANEL - Abnormal; Notable for the following components:   Glucose, Bld 146 (*)    BUN 29 (*)    Creatinine, Ser 1.39 (*)    Calcium 8.1 (*)    Total Protein 5.3 (*)    Albumin 2.5 (*)    Total Bilirubin 1.3 (*)    GFR, Estimated 51 (*)    All other components within normal limits  PROTIME-INR - Abnormal; Notable for the following components:   Prothrombin Time 17.0 (*)    INR 1.4 (*)    All other components within normal limits  POC OCCULT BLOOD, ED - Abnormal; Notable for the following components:   Fecal Occult Bld POSITIVE (*)    All other components within normal limits  TYPE AND SCREEN  PREPARE RBC (CROSSMATCH)    EKG None  Radiology No results found.  Procedures .Critical Care  Performed by: Silva Bandy, PA-C Authorized by: Silva Bandy, PA-C   Critical care provider statement:    Critical care time (minutes):  30   Critical care was necessary to treat or prevent imminent or life-threatening deterioration of the following conditions:  Circulatory failure   Critical care was time spent personally by me on the following activities:  Development of treatment plan with patient or surrogate, discussions with primary provider, evaluation of patient's response to treatment,  examination of patient, obtaining history from patient or surrogate, ordering and review of laboratory studies, ordering and review of radiographic studies, pulse oximetry, re-evaluation of patient's condition and review of old charts   Care discussed with: admitting provider       Medications Ordered in ED Medications  0.9 %  sodium chloride infusion (Manually program via Guardrails IV Fluids) (has no administration in time range)  pantoprazole (PROTONIX) injection 40 mg (has no administration  in time range)    ED Course/ Medical Decision Making/ A&P                                 Medical Decision Making Amount and/or Complexity of Data Reviewed Labs: ordered.  Risk Prescription drug management. Decision regarding hospitalization.   This patient is a 82 y.o. male who presents to the ED for concern of anemia, this involves an extensive number of treatment options, and is a complaint that carries with it a high risk of complications and morbidity. The emergent differential diagnosis prior to evaluation includes, but is not limited to,  GI bleed, symptomatic anemia, paraneoplastic syndrome . This is not an exhaustive differential.   Past Medical History / Co-morbidities / Social History:  has a past medical history of Diabetes mellitus without complication (HCC), High cholesterol, Hypertension, lung ca, and Normal nuclear stress test (01/29/2008).   Additional history: Chart reviewed. Pertinent results include: patient with recent admissions for similar symptoms, found to be anemic requiring blood transfusion, no definitive etiology found   Physical Exam: Physical exam performed. The pertinent findings include: patient overall well appearing, benign physical exam. Abdomen soft and non-tender. Rectal exam without obvious source of bleeding  Lab Tests: I ordered, and personally interpreted labs.  The pertinent results include:  hgb 4.8, down from 5.1 earlier today and down from 9.9  3 weeks ago. WBC 11.2 consistent with previous. Creatinine 1.39 consistent with previous   Medications: I ordered medication including protonix for concern for GI bleed, RBCs for blood transfusion. Reevaluation of the patient after these medicines showed that the patient improved. I have reviewed the patients home medicines and have made adjustments as needed.  Consultations Obtained: I requested consultation with the GI on call Dr. Adela Lank,  and discussed lab and imaging findings as well as pertinent plan - they recommend: start protonix, admit to medicine, they will see patient in the morning. Reconsult for intervention if patient becomes unstable   Disposition: After consideration of the diagnostic results and the patients response to treatment, I feel that patient will require admission for symptomatic anemia, GI bleed. He is hemodynamically stable at this time. Patient is understanding and in agreement with plan.  Discussed patient with family medicine teaching service who accepts patient for admission.    I discussed this case with my attending physician Dr. Bebe Shaggy who cosigned this note including patient's presenting symptoms, physical exam, and planned diagnostics and interventions. Attending physician stated agreement with plan or made changes to plan which were implemented.    Final Clinical Impression(s) / ED Diagnoses Final diagnoses:  Symptomatic anemia  Gastrointestinal hemorrhage, unspecified gastrointestinal hemorrhage type    Rx / DC Orders ED Discharge Orders     None         Vear Clock 10/16/22 0451    Zadie Rhine, MD 10/16/22 [phone]

## 2022-10-16 NOTE — Assessment & Plan Note (Signed)
Patient comes in with weeklong symptoms of weakness and fatigue, found to have a hemoglobin of 4.8 on admission.  Patient last hemoglobin was 9.9 nearly a month ago.  Patient was also Hemoccult positive on admission.  Patient has history of anemia, thought to be secondary to a combination of IDA, hemolytic anemia, anemia of chronic disease.  Patient is followed by hematology oncology, last seen in August.  Patient is hemodynamically stable. - Admit to FPTS, attending Dr. Miquel Dunn - Transfused 2 units PRBC - Posttransfusion H&H - AM CBC - GI following, appreciate recs - Fall precautions

## 2022-10-16 NOTE — Assessment & Plan Note (Signed)
Patient comes in for symptomatic anemia found to have positive Hemoccult test, concerning for GI bleed.  GI has been consulted will follow-up in the morning. - Insert 2 large-bore IV - Protonix - GI following appreciate recs - N.p.o.

## 2022-10-16 NOTE — H&P (View-Only) (Signed)
Consultation  Referring Provider: Melvenia Needles Med service / [facility] Primary Care Physician:  Carney Living, MD Primary Gastroenterologist:  Dr. Myrtie Neither  Reason for Consultation: Profound anemia, heme positive stool  HPI: Arthur [name] is a 82 y.o. male, followed by Dr. Mohammed/oncology with history of stage IV non-small cell lung cancer who has been on treatment over the past 18 months, with Alecensa.  He also has history of diabetes mellitus, hypertension, coronary artery disease with prior coronary stent, and history of PE for which she is on Eliquis. He had undergone GI evaluation per Dr. Myrtie Neither in 2017 for iron deficiency anemia and had colonoscopy at that time with finding of left and right sided diverticuli, he had one 2 mm adenomatous polyp removed and noted to have internal hemorrhoids.  At EGD had a large duodenal diverticulum and otherwise negative exam. He is currently on both Eliquis and Plavix. He has been having progressive problems with anemia over the past 3 to 4 months and his son reports that he has required 3 transfusions as an outpatient over that time. He had outpatient labs done yesterday and was found to have a hemoglobin of 5.1 and was sent to the emergency room. Further labs in the ER last evening with hemoglobin 4.8/hematocrit 16.5/MCV of 100/platelets 273 INR 1.4/pro time 17 BUN 29/creatinine 1.39 Bili 1.3, LFTs within normal limits Recent iron studies done in August with ferritin 698/serum iron 74/TIBC 223 and iron sat 33 Reviewing his labs in June 2024 hemoglobin 9.4, mid July was down to 6.1 and required transfusion, 08/27/2022 hemoglobin 7.5 8/13 hemoglobin 11.6  He was transfused 2 units of packed RBCs last evening and hemoglobin 8.3 today  Stool documented heme positive per ER last p.m. no obvious melena or hematochezia.  Patient has not complained had any complaint of heartburn or indigestion, no dysphagia, denies any abdominal pain, no changes in bowel  habits denies any diarrhea or changes in his usual constipation for which she had been on Linzess, any melena or hematochezia. He had been feeling more fatigued and weak over the past week or so, feels better today.  Last CT chest abdomen and pelvis without contrast 09/19/2022-interval resolution of focal loculated fluid overlying right paravertebral right lower lobe, new infiltrative changes in the superior segment of the left lower lobe and new clusters/tree-in-bud nodularity bilateral lower lobes infectious versus inflammatory follow-up in 3 months stable apparent spiculated nodule right lower lobe stable appearance ill-defined sclerotic foci within the bony pelvis and lumbar spine may reflect treated bone mets, large right inguinal hernia.  Past Medical History:  Diagnosis Date   Diabetes mellitus without complication (HCC)    High cholesterol    Hypertension    lung ca    Normal nuclear stress test 01/29/2008   stress perfusion study apparently in 2010 in [id number] which he said was negative.    Past Surgical History:  Procedure Laterality Date   CATARACT EXTRACTION, BILATERAL  2006   CORONARY STENT INTERVENTION N/A 02/06/2021   Procedure: CORONARY STENT INTERVENTION;  Surgeon: Corky Crafts, MD;  Location: [facility] INVASIVE CV LAB;  Service: Cardiovascular;  Laterality: N/A;   CORONARY ULTRASOUND/IVUS N/A 02/06/2021   Procedure: Intravascular Ultrasound/IVUS;  Surgeon: Corky Crafts, MD;  Location: [facility] INVASIVE CV LAB;  Service: Cardiovascular;  Laterality: N/A;   FINE NEEDLE ASPIRATION  03/21/2021   Procedure: FINE NEEDLE ASPIRATION;  Surgeon: Josephine Igo, DO;  Location: [facility] ENDOSCOPY;  Service: Pulmonary;;   IR RADIOLOGIST EVAL &  MGMT  12/24/2021   KNEE SURGERY  2005   LEFT HEART CATH AND CORONARY ANGIOGRAPHY N/A 02/06/2021   Procedure: LEFT HEART CATH AND CORONARY ANGIOGRAPHY;  Surgeon: Corky Crafts, MD;  Location: [facility] INVASIVE CV LAB;  Service:  Cardiovascular;  Laterality: N/A;   VIDEO BRONCHOSCOPY WITH ENDOBRONCHIAL ULTRASOUND Bilateral 03/21/2021   Procedure: VIDEO BRONCHOSCOPY WITH ENDOBRONCHIAL ULTRASOUND;  Surgeon: Josephine Igo, DO;  Location: [facility] ENDOSCOPY;  Service: Pulmonary;  Laterality: Bilateral;    Prior to Admission medications   Medication Sig Start Date End Date Taking? Authorizing Provider  alectinib (ALECENSA) 150 MG capsule Take 4 capsules (600 mg total) by mouth 2 (two) times daily with a meal. 07/31/22  Yes Si Gaul, MD  cetirizine (ZYRTEC) 10 MG tablet Take 1 tablet (10 mg total) by mouth daily. 09/13/22  Yes Carney Living, MD  clopidogrel (PLAVIX) 75 MG tablet Take 1 tablet by mouth once daily with breakfast 07/15/22  Yes Chambliss, Estill Batten, MD  ELIQUIS 2.5 MG TABS tablet Take 1 tablet by mouth twice daily 08/12/22  Yes Si Gaul, MD  Iron-FA-B Cmp-C-Biot-Probiotic (FUSION PLUS) CAPS Take 1 capsule by mouth daily. 09/13/21  Yes [provider]  linaclotide Karlene Einstein) 72 MCG capsule Take daily or every other day as needed 10/08/22  Yes Chambliss, Estill Batten, MD  polyethylene glycol (MIRALAX / GLYCOLAX) 17 g packet Take 17 g by mouth 2 (two) times daily.   Yes [provider]  rosuvastatin (CRESTOR) 5 MG tablet Take 1 tablet by mouth once daily 08/28/22  Yes Chambliss, Estill Batten, MD  senna (SENOKOT) 8.6 MG TABS tablet Take 1 tablet (8.6 mg total) by mouth daily. 08/08/22  Yes Shelby Mattocks, DO  sodium chloride (OCEAN) 0.65 % SOLN nasal spray Place 1 spray into both nostrils as needed for congestion. 09/06/22  Yes Tiffany Kocher, DO  furosemide (LASIX) 20 MG tablet Take 20 mg by mouth daily as needed for edema (lower extremites). Patient not taking: Reported on 10/16/2022    [provider]    Current Facility-Administered Medications  Medication Dose Route Frequency Provider Last Rate Last Admin   alectinib (ALECENSA) capsule 600 mg  600 mg Oral BID WC Bess Kinds,  MD       Fe Fum-Vit C-Vit B12-FA (TRIGELS-F FORTE) capsule 1 capsule  1 capsule Oral Daily Bess Kinds, MD   1 capsule at 10/16/22 8295   furosemide (LASIX) tablet 20 mg  20 mg Oral Daily PRN Bess Kinds, MD       linaclotide (LINZESS) capsule 72 mcg  72 mcg Oral Q48H PRN Bess Kinds, MD       pantoprazole (PROTONIX) injection 40 mg  40 mg Intravenous Q12H Lincoln Brigham, MD   40 mg at 10/16/22 1212   peg 3350 powder (MOVIPREP) kit 100 g  0.5 kit Oral Once Etoy Mcdonnell S, PA-C       rosuvastatin (CRESTOR) tablet 5 mg  5 mg Oral Daily Bess Kinds, MD       Current Outpatient Medications  Medication Sig Dispense Refill   alectinib (ALECENSA) 150 MG capsule Take 4 capsules (600 mg total) by mouth 2 (two) times daily with a meal. 240 capsule 3   cetirizine (ZYRTEC) 10 MG tablet Take 1 tablet (10 mg total) by mouth daily. 30 tablet 0   clopidogrel (PLAVIX) 75 MG tablet Take 1 tablet by mouth once daily with breakfast 90 tablet 0   ELIQUIS 2.5 MG TABS tablet Take 1 tablet by mouth twice  daily 180 tablet 0   Iron-FA-B Cmp-C-Biot-Probiotic (FUSION PLUS) CAPS Take 1 capsule by mouth daily.     linaclotide (LINZESS) 72 MCG capsule Take daily or every other day as needed 30 capsule 2   polyethylene glycol (MIRALAX / GLYCOLAX) 17 g packet Take 17 g by mouth 2 (two) times daily.     rosuvastatin (CRESTOR) 5 MG tablet Take 1 tablet by mouth once daily 90 tablet 2   senna (SENOKOT) 8.6 MG TABS tablet Take 1 tablet (8.6 mg total) by mouth daily. 120 tablet 0   sodium chloride (OCEAN) 0.65 % SOLN nasal spray Place 1 spray into both nostrils as needed for congestion. 88 mL 1   furosemide (LASIX) 20 MG tablet Take 20 mg by mouth daily as needed for edema (lower extremites). (Patient not taking: Reported on 10/16/2022)      Allergies as of 10/15/2022 - Review Complete 10/08/2022  Allergen Reaction Noted   Ace inhibitors Cough 11/06/2009   Iohexol  06/25/2021    Family History  Problem  Relation Age of Onset   Coronary artery disease Father    Coronary artery disease Mother     Social History   Socioeconomic History   Marital status: Married    Spouse name: Not on file   Number of children: Not on file   Years of education: Not on file   Highest education level: Not on file  Occupational History   Not on file  Tobacco Use   Smoking status: Never   Smokeless tobacco: Never  Substance and Sexual Activity   Alcohol use: No   Drug use: No   Sexual activity: Not on file  Other Topics Concern   Not on file  Social History Narrative   Social History:   He works part-time as a Airline pilot. He is married with 3 children. He's never smoked cigarettes and does not drink alcohol.            Social Determinants of Health   Financial Resource Strain: Not on file  Food Insecurity: No Food Insecurity (08/09/2022)   Hunger Vital Sign    Worried About Running Out of Food in the Last Year: Never true    Ran Out of Food in the Last Year: Never true  Transportation Needs: Unmet Transportation Needs (08/09/2022)   PRAPARE - Administrator, Civil Service (Medical): Yes    Lack of Transportation (Non-Medical): Yes  Physical Activity: Not on file  Stress: Not on file  Social Connections: Not on file  Intimate Partner Violence: Not At Risk (08/09/2022)   Humiliation, Afraid, Rape, and Kick questionnaire    Fear of Current or Ex-Partner: No    Emotionally Abused: No    Physically Abused: No    Sexually Abused: No    Review of Systems: Pertinent positive and negative review of systems were noted in the above HPI section.  All other review of systems was otherwise negative.   Physical Exam: Vital signs in last 24 hours: Temp:  [97.7 F (36.5 C)-98.7 F (37.1 C)] 97.9 F (36.6 C) (09/18 0904) Pulse Rate:  [58-93] 80 (09/18 1145) Resp:  [12-22] 22 (09/18 1145) BP: (103-140)/(41-93) 125/49 (09/18 1145) SpO2:  [95 %-100 %] 99 % (09/18 1145)   General:   Alert,   Well-developed, well-nourished, elderly male pleasant and cooperative in NAD, son at bedside Head:  Normocephalic and atraumatic. Eyes:  Sclera clear, no icterus.   Conjunctiva  Ears:  Normal auditory acuity. Nose:  No deformity, discharge,  or lesions. Mouth:  No deformity or lesions.   Neck:  Supple; no masses or thyromegaly. Lungs:  Clear throughout to auscultation.  Few rhonchi in the left base . Heart:  Regular rate and rhythm; no murmurs, clicks, rubs,  or gallops. Abdomen:  Soft,nontender, BS active,nonpalp mass or hsm.   Rectal: Not done, heme positive per ER Msk:  Symmetrical without gross deformities. . Pulses:  Normal pulses noted. Extremities:  Without clubbing or edema. Neurologic:  Alert and  oriented x4;  grossly normal neurologically. Skin:  Intact without significant lesions or rashes.. Psych:  Alert and cooperative. Normal mood and affect.  Intake/Output from previous day: 09/17 0701 - 09/18 0700 In: 315 [Blood:315] Out: -  Intake/Output this shift: Total I/O In: 315 [Blood:315] Out: -   Lab Results: Recent Labs    10/15/22 1229 10/15/22 2327 10/16/22 0910  WBC 12.2* 11.2* 9.4  HGB 5.1* 4.8* 8.3*  HCT 16.9* 16.5* 25.7*  PLT 253 233 217   BMET Recent Labs    10/15/22 1229 10/15/22 2327  NA 141 137  K 3.8 3.8  CL 105 104  CO2 22 23  GLUCOSE 209* 146*  BUN 34* 29*  CREATININE 1.39* 1.39*  CALCIUM 8.7 8.1*   LFT Recent Labs    10/15/22 2327  PROT 5.3*  ALBUMIN 2.5*  AST 32  ALT 21  ALKPHOS 60  BILITOT 1.3*   PT/INR Recent Labs    10/15/22 2327  LABPROT 17.0*  INR 1.4*   Hepatitis Panel No results for input(s): "HEPBSAG", "HCVAB", "HEPAIGM", "HEPBIGM" in the last 72 hours.   IMPRESSION:  #19  82 year old male with stage IV non-small cell lung cancer, on treatment with Alecensa long history of iron deficiency anemia, who was referred to the ER after outpatient labs yesterday showed hemoglobin of 5.1.  Patient has complained of  increased fatigue and weakness over the past week.  Has had increased difficulty with anemia over the past 4 to 5 months and his son reports that he has required prior to 3 outpatient transfusions within the past couple of months.  He has not had any overt bleeding. He is on both Eliquis and Plavix Most recent iron studies August 2022 not consistent with iron deficiency  Documented heme positive in the ER last evening  Previous GI workup 2017 with EGD and colonoscopy unremarkable other than a 2 mm tubular adenoma and a large duodenal diverticulum.  Suspect he has had chronic intermittent low-grade GI blood loss long-term exacerbated by Eliquis and Plavix as well as chemotherapy. Consider chronic gastropathy, AVMs  #2 adult onset diabetes mellitus 3.  Hypertension 4.  History of PE on Eliquis 5.  Coronary artery disease status post prior coronary stent remains on Plavix  PLAN:  Transfuse to keep hemoglobin 7 Continue Protonix once daily Hold Eliquis and Plavix Patient will be scheduled for EGD, and probable capsule endoscopy tomorrow with Dr. Tomasa Rand.  Both procedures were discussed in detail with the patient including indications risks and benefits and he is agreeable to proceed.  If obvious source for anemia found at time of EGD, may forego capsule endoscopy.  I think he would benefit from further discussion about need for continuing both Eliquis and Plavix, and would defer that to the medicine team team and/or his cardiologist   Lander Eslick PA-C 10/16/2022, 12:55 PM

## 2022-10-16 NOTE — ED Notes (Signed)
ED TO INPATIENT HANDOFF REPORT  ED Nurse Name and Phone #:  Theophilus Bones [phone]  S Name/Age/Gender Arthur [name] 82 y.o. male Room/Bed: 022C/022C  Code Status   Code Status: Full Code  Home/SNF/Other Home Patient oriented to: self, place, time, and situation Is this baseline? Yes   Triage Complete: Triage complete  Chief Complaint GI bleed [K92.2]  Triage Note Patient received a call from his PCP this evening advised him to go to ER for blood transfusion due to low hemoglobin test result taken this morning at clinic . He adds generalized weakness/fatigue this week . Denies hematuria or hematochezia/melena.    Allergies Allergies  Allergen Reactions   Ace Inhibitors Cough   Iohexol     Severe ATN after CT with contrast May 2023 - creatinine peaked near 8. Did not require HD.    Level of Care/Admitting Diagnosis ED Disposition     ED Disposition  Admit   Condition  --   Comment  Hospital [facility]: [facility] [[id number]]  Level of Care: Progressive [102]  Admit to Progressive based on following criteria: GI, ENDOCRINE disease patients with GI bleeding, acute liver failure or pancreatitis, stable with diabetic ketoacidosis or thyrotoxicosis (hypothyroid) state.  May admit patient to [facility] or [facility] if equivalent level of care is available:: No  Covid Evaluation: Asymptomatic - no recent exposure (last 10 days) testing not required  Diagnosis: GI bleed [564332]  Admitting Physician: Billey Co [[id number]]  Attending Physician: Billey Co [[id number]]  Certification:: I certify this patient will need inpatient services for at least 2 midnights  Expected Medical Readiness: 10/18/2022          B Medical/Surgery History Past Medical History:  Diagnosis Date   Diabetes mellitus without complication (HCC)    High cholesterol    Hypertension    lung ca    Normal nuclear stress test 01/29/2008   stress perfusion study apparently in  2010 in [facility] which he said was negative.   Past Surgical History:  Procedure Laterality Date   CATARACT EXTRACTION, BILATERAL  2006   CORONARY STENT INTERVENTION N/A 02/06/2021   Procedure: CORONARY STENT INTERVENTION;  Surgeon: Corky Crafts, MD;  Location: [facility] INVASIVE CV LAB;  Service: Cardiovascular;  Laterality: N/A;   CORONARY ULTRASOUND/IVUS N/A 02/06/2021   Procedure: Intravascular Ultrasound/IVUS;  Surgeon: Corky Crafts, MD;  Location: [facility] INVASIVE CV LAB;  Service: Cardiovascular;  Laterality: N/A;   FINE NEEDLE ASPIRATION  03/21/2021   Procedure: FINE NEEDLE ASPIRATION;  Surgeon: Josephine Igo, DO;  Location: [facility] ENDOSCOPY;  Service: Pulmonary;;   IR RADIOLOGIST EVAL & MGMT  12/24/2021   KNEE SURGERY  2005   LEFT HEART CATH AND CORONARY ANGIOGRAPHY N/A 02/06/2021   Procedure: LEFT HEART CATH AND CORONARY ANGIOGRAPHY;  Surgeon: Corky Crafts, MD;  Location: [facility] INVASIVE CV LAB;  Service: Cardiovascular;  Laterality: N/A;   VIDEO BRONCHOSCOPY WITH ENDOBRONCHIAL ULTRASOUND Bilateral 03/21/2021   Procedure: VIDEO BRONCHOSCOPY WITH ENDOBRONCHIAL ULTRASOUND;  Surgeon: Josephine Igo, DO;  Location: [facility] ENDOSCOPY;  Service: Pulmonary;  Laterality: Bilateral;     A IV Location/Drains/Wounds Patient Lines/Drains/Airways Status     Active Line/Drains/Airways     Name Placement date Placement time Site Days   Peripheral IV 10/16/22 20 G Anterior;Left Forearm 10/16/22  0132  Forearm  less than 1            Intake/Output Last 24 hours  Intake/Output Summary (Last 24 hours) at 10/16/2022  4782 Last data filed at 10/16/2022 0515 Gross per 24 hour  Intake 315 ml  Output --  Net 315 ml    Labs/Imaging Results for orders placed or performed during the hospital encounter of 10/15/22 (from the past 48 hour(s))  Type and screen [facility]     Status: None (Preliminary result)   Collection Time: 10/15/22 11:27 PM  Result Value Ref  Range   ABO/RH(D) B NEG    Antibody Screen NEG    Sample Expiration 10/18/2022,2359    Unit Number [id number]    Blood Component Type RED CELLS,LR    Unit division 00    Status of Unit ISSUED    Transfusion Status OK TO TRANSFUSE    Crossmatch Result      Compatible Performed at [facility] Lab, [address], [place], [place] [address]    Unit Number [id number]    Blood Component Type RED CELLS,LR    Unit division 00    Status of Unit ISSUED    Transfusion Status OK TO TRANSFUSE    Crossmatch Result Compatible   CBC with Differential     Status: Abnormal   Collection Time: 10/15/22 11:27 PM  Result Value Ref Range   WBC 11.2 (H) 4.0 - 10.5 K/uL   RBC 1.64 (L) 4.22 - 5.81 MIL/uL   Hemoglobin 4.8 (LL) 13.0 - 17.0 g/dL    Comment: REPEATED TO VERIFY THIS CRITICAL RESULT HAS VERIFIED AND BEEN CALLED TO B SANGALANG RN BY JALEESA WHITE ON 09 18 2024 AT 0007, AND HAS BEEN READ BACK.     HCT 16.5 (L) 39.0 - 52.0 %   MCV 100.6 (H) 80.0 - 100.0 fL   MCH 29.3 26.0 - 34.0 pg   MCHC 29.1 (L) 30.0 - 36.0 g/dL   RDW 72.5 (H) 36.6 - 44.0 %   Platelets 233 150 - 400 K/uL   nRBC 5.9 (H) 0.0 - 0.2 %   Neutrophils Relative % 65 %   Neutro Abs 7.3 1.7 - 7.7 K/uL   Lymphocytes Relative 21 %   Lymphs Abs 2.4 0.7 - 4.0 K/uL   Monocytes Relative 9 %   Monocytes Absolute 1.0 0.1 - 1.0 K/uL   Eosinophils Relative 2 %   Eosinophils Absolute 0.2 0.0 - 0.5 K/uL   Basophils Relative 0 %   Basophils Absolute 0.1 0.0 - 0.1 K/uL   Immature Granulocytes 3 %   Abs Immature Granulocytes 0.29 (H) 0.00 - 0.07 K/uL    Comment: Performed at [facility] Lab, [address] [place], [place] [address]  Comprehensive metabolic panel     Status: Abnormal   Collection Time: 10/15/22 11:27 PM  Result Value Ref Range   Sodium 137 135 - 145 mmol/L   Potassium 3.8 3.5 - 5.1 mmol/L   Chloride 104 98 - 111 mmol/L   CO2 23 22 - 32 mmol/L   Glucose, Bld 146 (H) 70 - 99 mg/dL    Comment: Glucose  reference range applies only to samples taken after fasting for at least 8 hours.   BUN 29 (H) 8 - 23 mg/dL   Creatinine, Ser 5.95 (H) 0.61 - 1.24 mg/dL   Calcium 8.1 (L) 8.9 - 10.3 mg/dL   Total Protein 5.3 (L) 6.5 - 8.1 g/dL   Albumin 2.5 (L) 3.5 - 5.0 g/dL   AST 32 15 - 41 U/L   ALT 21 0 - 44 U/L   Alkaline Phosphatase 60 38 - 126  U/L   Total Bilirubin 1.3 (H) 0.3 - 1.2 mg/dL   GFR, Estimated 51 (L) >60 mL/min    Comment: (NOTE) Calculated using the CKD-EPI Creatinine Equation (2021)    Anion gap 10 5 - 15    Comment: Performed at [facility] Lab, [address] [place], [place] [address]  Protime-INR     Status: Abnormal   Collection Time: 10/15/22 11:27 PM  Result Value Ref Range   Prothrombin Time 17.0 (H) 11.4 - 15.2 seconds   INR 1.4 (H) 0.8 - 1.2    Comment: (NOTE) INR goal varies based on device and disease states. Performed at [facility] Lab, [address] [place], [place] [address]   POC occult blood, ED     Status: Abnormal   Collection Time: 10/16/22 12:41 AM  Result Value Ref Range   Fecal Occult Bld POSITIVE (A) NEGATIVE  Prepare RBC (crossmatch)     Status: None   Collection Time: 10/16/22 12:45 AM  Result Value Ref Range   Order Confirmation      ORDER PROCESSED BY BLOOD BANK Performed at [facility] Lab, [address], [place], [place] [address]    No results found.  Pending Labs Unresulted Labs (From admission, onward)     Start     Ordered   10/16/22 0830  CBC  Once,   R        10/16/22 0220            Vitals/Pain Today's Vitals   10/16/22 0645 10/16/22 0700 10/16/22 0715 10/16/22 0717  BP: (!) 113/58 (!) 112/54 (!) 107/54   Pulse: 77 70 76 69  Resp: 16 16 12 14   Temp:    97.9 F (36.6 C)  TempSrc:    Oral  SpO2: 98% 99% 97% 100%  PainSc:        Isolation Precautions No active isolations  Medications Medications  alectinib (ALECENSA) capsule 600 mg (has no administration in time range)  furosemide (LASIX)  tablet 20 mg (has no administration in time range)  rosuvastatin (CRESTOR) tablet 5 mg (has no administration in time range)  Fe Fum-Vit C-Vit B12-FA (TRIGELS-F FORTE) capsule 1 capsule (has no administration in time range)  linaclotide (LINZESS) capsule 72 mcg (has no administration in time range)  pantoprazole (PROTONIX) injection 40 mg (has no administration in time range)  0.9 %  sodium chloride infusion (Manually program via Guardrails IV Fluids) ( Intravenous New Bag/Given 10/16/22 0139)  pantoprazole (PROTONIX) injection 40 mg (40 mg Intravenous Given 10/16/22 0134)    Mobility walks     Focused Assessments GI   R Recommendations: See Admitting Provider Note  Report given to:   Additional Notes:

## 2022-10-16 NOTE — Consult Note (Signed)
Consultation  Referring Provider: Melvenia [facility] service / [facility] Primary Care Physician:  Carney Living, MD Primary Gastroenterologist:  Dr. Myrtie Neither  Reason for Consultation: Profound anemia, heme positive stool  HPI: Arthur [name] is a 82 y.o. male, followed by Dr. Mohammed/oncology with history of stage IV non-small cell lung cancer who has been on treatment over the past 18 months, with Alecensa.  He also has history of diabetes mellitus, hypertension, coronary artery disease with prior coronary stent, and history of PE for which she is on Eliquis. He had undergone GI evaluation per Dr. Myrtie Neither in 2017 for iron deficiency anemia and had colonoscopy at that time with finding of left and right sided diverticuli, he had one 2 mm adenomatous polyp removed and noted to have internal hemorrhoids.  At EGD had a large duodenal diverticulum and otherwise negative exam. He is currently on both Eliquis and Plavix. He has been having progressive problems with anemia over the past 3 to 4 months and his son reports that he has required 3 transfusions as an outpatient over that time. He had outpatient labs done yesterday and was found to have a hemoglobin of 5.1 and was sent to the emergency room. Further labs in the ER last evening with hemoglobin 4.8/hematocrit 16.5/MCV of 100/platelets 273 INR 1.4/pro time 17 BUN 29/creatinine 1.39 Bili 1.3, LFTs within normal limits Recent iron studies done in August with ferritin 698/serum iron 74/TIBC 223 and iron sat 33 Reviewing his labs in June 2024 hemoglobin 9.4, mid July was down to 6.1 and required transfusion, 08/27/2022 hemoglobin 7.5 8/13 hemoglobin 11.6  He was transfused 2 units of packed RBCs last evening and hemoglobin 8.3 today  Stool documented heme positive per ER last p.m. no obvious melena or hematochezia.  Patient has not complained had any complaint of heartburn or indigestion, no dysphagia, denies any abdominal pain, no changes in bowel  habits denies any diarrhea or changes in his usual constipation for which she had been on Linzess, any melena or hematochezia. He had been feeling more fatigued and weak over the past week or so, feels better today.  Last CT chest abdomen and pelvis without contrast 09/19/2022-interval resolution of focal loculated fluid overlying right paravertebral right lower lobe, new infiltrative changes in the superior segment of the left lower lobe and new clusters/tree-in-bud nodularity bilateral lower lobes infectious versus inflammatory follow-up in 3 months stable apparent spiculated nodule right lower lobe stable appearance ill-defined sclerotic foci within the bony pelvis and lumbar spine may reflect treated bone mets, large right inguinal hernia.  Past Medical History:  Diagnosis Date   Diabetes mellitus without complication (HCC)    High cholesterol    Hypertension    lung ca    Normal nuclear stress test 01/29/2008   stress perfusion study apparently in 2010 in [id number] [facility] which he said was negative.    Past Surgical History:  Procedure Laterality Date   CATARACT EXTRACTION, BILATERAL  2006   CORONARY STENT INTERVENTION N/A 02/06/2021   Procedure: CORONARY STENT INTERVENTION;  Surgeon: Corky Crafts, MD;  Location: [facility] INVASIVE CV LAB;  Service: Cardiovascular;  Laterality: N/A;   CORONARY ULTRASOUND/IVUS N/A 02/06/2021   Procedure: Intravascular Ultrasound/IVUS;  Surgeon: Corky Crafts, MD;  Location: [facility] INVASIVE CV LAB;  Service: Cardiovascular;  Laterality: N/A;   FINE NEEDLE ASPIRATION  03/21/2021   Procedure: FINE NEEDLE ASPIRATION;  Surgeon: Josephine Igo, DO;  Location: [facility] ENDOSCOPY;  Service: Pulmonary;;   IR RADIOLOGIST EVAL &  MGMT  12/24/2021   KNEE SURGERY  2005   LEFT HEART CATH AND CORONARY ANGIOGRAPHY N/A 02/06/2021   Procedure: LEFT HEART CATH AND CORONARY ANGIOGRAPHY;  Surgeon: Corky Crafts, MD;  Location: [facility] INVASIVE CV LAB;  Service:  Cardiovascular;  Laterality: N/A;   VIDEO BRONCHOSCOPY WITH ENDOBRONCHIAL ULTRASOUND Bilateral 03/21/2021   Procedure: VIDEO BRONCHOSCOPY WITH ENDOBRONCHIAL ULTRASOUND;  Surgeon: Josephine Igo, DO;  Location: [facility] ENDOSCOPY;  Service: Pulmonary;  Laterality: Bilateral;    Prior to Admission medications   Medication Sig Start Date End Date Taking? Authorizing Provider  alectinib (ALECENSA) 150 MG capsule Take 4 capsules (600 mg total) by mouth 2 (two) times daily with a meal. 07/31/22  Yes Si Gaul, MD  cetirizine (ZYRTEC) 10 MG tablet Take 1 tablet (10 mg total) by mouth daily. 09/13/22  Yes Carney Living, MD  clopidogrel (PLAVIX) 75 MG tablet Take 1 tablet by mouth once daily with breakfast 07/15/22  Yes Chambliss, Estill Batten, MD  ELIQUIS 2.5 MG TABS tablet Take 1 tablet by mouth twice daily 08/12/22  Yes Si Gaul, MD  Iron-FA-B Cmp-C-Biot-Probiotic (FUSION PLUS) CAPS Take 1 capsule by mouth daily. 09/13/21  Yes [provider]  linaclotide Karlene Einstein) 72 MCG capsule Take daily or every other day as needed 10/08/22  Yes Chambliss, Estill Batten, MD  polyethylene glycol (MIRALAX / GLYCOLAX) 17 g packet Take 17 g by mouth 2 (two) times daily.   Yes [provider]  rosuvastatin (CRESTOR) 5 MG tablet Take 1 tablet by mouth once daily 08/28/22  Yes Chambliss, Estill Batten, MD  senna (SENOKOT) 8.6 MG TABS tablet Take 1 tablet (8.6 mg total) by mouth daily. 08/08/22  Yes Shelby Mattocks, DO  sodium chloride (OCEAN) 0.65 % SOLN nasal spray Place 1 spray into both nostrils as needed for congestion. 09/06/22  Yes Tiffany Kocher, DO  furosemide (LASIX) 20 MG tablet Take 20 mg by mouth daily as needed for edema (lower extremites). Patient not taking: Reported on 10/16/2022    [provider]    Current Facility-Administered Medications  Medication Dose Route Frequency Provider Last Rate Last Admin   alectinib (ALECENSA) capsule 600 mg  600 mg Oral BID WC Bess Kinds,  MD       Fe Fum-Vit C-Vit B12-FA (TRIGELS-F FORTE) capsule 1 capsule  1 capsule Oral Daily Bess Kinds, MD   1 capsule at 10/16/22 8295   furosemide (LASIX) tablet 20 mg  20 mg Oral Daily PRN Bess Kinds, MD       linaclotide (LINZESS) capsule 72 mcg  72 mcg Oral Q48H PRN Bess Kinds, MD       pantoprazole (PROTONIX) injection 40 mg  40 mg Intravenous Q12H Lincoln Brigham, MD   40 mg at 10/16/22 1212   peg 3350 powder (MOVIPREP) kit 100 g  0.5 kit Oral Once Etoy Mcdonnell S, PA-C       rosuvastatin (CRESTOR) tablet 5 mg  5 mg Oral Daily Bess Kinds, MD       Current Outpatient Medications  Medication Sig Dispense Refill   alectinib (ALECENSA) 150 MG capsule Take 4 capsules (600 mg total) by mouth 2 (two) times daily with a meal. 240 capsule 3   cetirizine (ZYRTEC) 10 MG tablet Take 1 tablet (10 mg total) by mouth daily. 30 tablet 0   clopidogrel (PLAVIX) 75 MG tablet Take 1 tablet by mouth once daily with breakfast 90 tablet 0   ELIQUIS 2.5 MG TABS tablet Take 1 tablet by mouth twice  daily 180 tablet 0   Iron-FA-B Cmp-C-Biot-Probiotic (FUSION PLUS) CAPS Take 1 capsule by mouth daily.     linaclotide (LINZESS) 72 MCG capsule Take daily or every other day as needed 30 capsule 2   polyethylene glycol (MIRALAX / GLYCOLAX) 17 g packet Take 17 g by mouth 2 (two) times daily.     rosuvastatin (CRESTOR) 5 MG tablet Take 1 tablet by mouth once daily 90 tablet 2   senna (SENOKOT) 8.6 MG TABS tablet Take 1 tablet (8.6 mg total) by mouth daily. 120 tablet 0   sodium chloride (OCEAN) 0.65 % SOLN nasal spray Place 1 spray into both nostrils as needed for congestion. 88 mL 1   furosemide (LASIX) 20 MG tablet Take 20 mg by mouth daily as needed for edema (lower extremites). (Patient not taking: Reported on 10/16/2022)      Allergies as of 10/15/2022 - Review Complete 10/08/2022  Allergen Reaction Noted   Ace inhibitors Cough 11/06/2009   Iohexol  06/25/2021    Family History  Problem  Relation Age of Onset   Coronary artery disease Father    Coronary artery disease Mother     Social History   Socioeconomic History   Marital status: Married    Spouse name: Not on file   Number of children: Not on file   Years of education: Not on file   Highest education level: Not on file  Occupational History   Not on file  Tobacco Use   Smoking status: Never   Smokeless tobacco: Never  Substance and Sexual Activity   Alcohol use: No   Drug use: No   Sexual activity: Not on file  Other Topics Concern   Not on file  Social History Narrative   Social History:   He works part-time as a Airline pilot. He is married with 3 children. He's never smoked cigarettes and does not drink alcohol.            Social Determinants of Health   Financial Resource Strain: Not on file  Food Insecurity: No Food Insecurity (08/09/2022)   Hunger Vital Sign    Worried About Running Out of Food in the Last Year: Never true    Ran Out of Food in the Last Year: Never true  Transportation Needs: Unmet Transportation Needs (08/09/2022)   PRAPARE - Administrator, Civil Service (Medical): Yes    Lack of Transportation (Non-Medical): Yes  Physical Activity: Not on file  Stress: Not on file  Social Connections: Not on file  Intimate Partner Violence: Not At Risk (08/09/2022)   Humiliation, Afraid, Rape, and Kick questionnaire    Fear of Current or Ex-Partner: No    Emotionally Abused: No    Physically Abused: No    Sexually Abused: No    Review of Systems: Pertinent positive and negative review of systems were noted in the above HPI section.  All other review of systems was otherwise negative.   Physical Exam: Vital signs in last 24 hours: Temp:  [97.7 F (36.5 C)-98.7 F (37.1 C)] 97.9 F (36.6 C) (09/18 0904) Pulse Rate:  [58-93] 80 (09/18 1145) Resp:  [12-22] 22 (09/18 1145) BP: (103-140)/(41-93) 125/49 (09/18 1145) SpO2:  [95 %-100 %] 99 % (09/18 1145)   General:   Alert,   Well-developed, well-nourished, elderly male pleasant and cooperative in NAD, son at bedside Head:  Normocephalic and atraumatic. Eyes:  Sclera clear, no icterus.   Conjunctiva  Ears:  Normal auditory acuity. Nose:  No deformity, discharge,  or lesions. Mouth:  No deformity or lesions.   Neck:  Supple; no masses or thyromegaly. Lungs:  Clear throughout to auscultation.  Few rhonchi in the left base . Heart:  Regular rate and rhythm; no murmurs, clicks, rubs,  or gallops. Abdomen:  Soft,nontender, BS active,nonpalp mass or hsm.   Rectal: Not done, heme positive per ER Msk:  Symmetrical without gross deformities. . Pulses:  Normal pulses noted. Extremities:  Without clubbing or edema. Neurologic:  Alert and  oriented x4;  grossly normal neurologically. Skin:  Intact without significant lesions or rashes.. Psych:  Alert and cooperative. Normal mood and affect.  Intake/Output from previous day: 09/17 0701 - 09/18 0700 In: 315 [Blood:315] Out: -  Intake/Output this shift: Total I/O In: 315 [Blood:315] Out: -   Lab Results: Recent Labs    10/15/22 1229 10/15/22 2327 10/16/22 0910  WBC 12.2* 11.2* 9.4  HGB 5.1* 4.8* 8.3*  HCT 16.9* 16.5* 25.7*  PLT 253 233 217   BMET Recent Labs    10/15/22 1229 10/15/22 2327  NA 141 137  K 3.8 3.8  CL 105 104  CO2 22 23  GLUCOSE 209* 146*  BUN 34* 29*  CREATININE 1.39* 1.39*  CALCIUM 8.7 8.1*   LFT Recent Labs    10/15/22 2327  PROT 5.3*  ALBUMIN 2.5*  AST 32  ALT 21  ALKPHOS 60  BILITOT 1.3*   PT/INR Recent Labs    10/15/22 2327  LABPROT 17.0*  INR 1.4*   Hepatitis Panel No results for input(s): "HEPBSAG", "HCVAB", "HEPAIGM", "HEPBIGM" in the last 72 hours.   IMPRESSION:  #19  82 year old male with stage IV non-small cell lung cancer, on treatment with Alecensa long history of iron deficiency anemia, who was referred to the ER after outpatient labs yesterday showed hemoglobin of 5.1.  Patient has complained of  increased fatigue and weakness over the past week.  Has had increased difficulty with anemia over the past 4 to 5 months and his son reports that he has required prior to 3 outpatient transfusions within the past couple of months.  He has not had any overt bleeding. He is on both Eliquis and Plavix Most recent iron studies August 2022 not consistent with iron deficiency  Documented heme positive in the ER last evening  Previous GI workup 2017 with EGD and colonoscopy unremarkable other than a 2 mm tubular adenoma and a large duodenal diverticulum.  Suspect he has had chronic intermittent low-grade GI blood loss long-term exacerbated by Eliquis and Plavix as well as chemotherapy. Consider chronic gastropathy, AVMs  #2 adult onset diabetes mellitus 3.  Hypertension 4.  History of PE on Eliquis 5.  Coronary artery disease status post prior coronary stent remains on Plavix  PLAN:  Transfuse to keep hemoglobin 7 Continue Protonix once daily Hold Eliquis and Plavix Patient will be scheduled for EGD, and probable capsule endoscopy tomorrow with Dr. Tomasa Rand.  Both procedures were discussed in detail with the patient including indications risks and benefits and he is agreeable to proceed.  If obvious source for anemia found at time of EGD, may forego capsule endoscopy.  I think he would benefit from further discussion about need for continuing both Eliquis and Plavix, and would defer that to the medicine team team and/or his cardiologist   Lander Eslick PA-C 10/16/2022, 12:55 PM

## 2022-10-17 ENCOUNTER — Encounter (HOSPITAL_COMMUNITY)
Admission: EM | Disposition: A | Discharge status: Home Health | Payer: Self-pay | Source: Home / Self Care | Attending: Family Medicine

## 2022-10-17 ENCOUNTER — Inpatient Hospital Stay (HOSPITAL_COMMUNITY): Payer: 59 | Admitting: Anesthesiology

## 2022-10-17 ENCOUNTER — Encounter (HOSPITAL_COMMUNITY): Payer: Self-pay | Admitting: Family Medicine

## 2022-10-17 DIAGNOSIS — D649 Anemia, unspecified: Secondary | ICD-10-CM | POA: Diagnosis not present

## 2022-10-17 DIAGNOSIS — D509 Iron deficiency anemia, unspecified: Secondary | ICD-10-CM | POA: Diagnosis not present

## 2022-10-17 DIAGNOSIS — C3491 Malignant neoplasm of unspecified part of right bronchus or lung: Secondary | ICD-10-CM | POA: Diagnosis not present

## 2022-10-17 DIAGNOSIS — K269 Duodenal ulcer, unspecified as acute or chronic, without hemorrhage or perforation: Secondary | ICD-10-CM | POA: Diagnosis not present

## 2022-10-17 DIAGNOSIS — K264 Chronic or unspecified duodenal ulcer with hemorrhage: Secondary | ICD-10-CM

## 2022-10-17 DIAGNOSIS — I2510 Atherosclerotic heart disease of native coronary artery without angina pectoris: Secondary | ICD-10-CM

## 2022-10-17 DIAGNOSIS — E1169 Type 2 diabetes mellitus with other specified complication: Secondary | ICD-10-CM

## 2022-10-17 DIAGNOSIS — Z955 Presence of coronary angioplasty implant and graft: Secondary | ICD-10-CM

## 2022-10-17 DIAGNOSIS — E785 Hyperlipidemia, unspecified: Secondary | ICD-10-CM | POA: Diagnosis not present

## 2022-10-17 DIAGNOSIS — Z86711 Personal history of pulmonary embolism: Secondary | ICD-10-CM | POA: Diagnosis not present

## 2022-10-17 DIAGNOSIS — I10 Essential (primary) hypertension: Secondary | ICD-10-CM | POA: Diagnosis not present

## 2022-10-17 DIAGNOSIS — I2583 Coronary atherosclerosis due to lipid rich plaque: Secondary | ICD-10-CM

## 2022-10-17 HISTORY — PX: BIOPSY: SHX5522

## 2022-10-17 HISTORY — PX: ESOPHAGOGASTRODUODENOSCOPY: SHX5428

## 2022-10-17 LAB — CBC WITH DIFFERENTIAL/PLATELET
Basophils Absolute: 0.1 10*3/uL (ref 0.0–0.2)
Basos: 0 %
EOS (ABSOLUTE): 0.2 10*3/uL (ref 0.0–0.4)
Eos: 1 %
Hematocrit: 16.9 % — CL (ref 37.5–51.0)
Hemoglobin: 5.1 g/dL — CL (ref 13.0–17.7)
Immature Grans (Abs): 0.5 10*3/uL — ABNORMAL HIGH (ref 0.0–0.1)
Immature Granulocytes: 4 %
Lymphocytes Absolute: 2.1 10*3/uL (ref 0.7–3.1)
Lymphs: 17 %
MCH: 29.7 pg (ref 26.6–33.0)
MCHC: 30.2 g/dL — ABNORMAL LOW (ref 31.5–35.7)
MCV: 98 fL — ABNORMAL HIGH (ref 79–97)
Monocytes Absolute: 0.7 10*3/uL (ref 0.1–0.9)
Monocytes: 6 %
NRBC: 5 % — ABNORMAL HIGH (ref 0–0)
Neutrophils Absolute: 8.7 10*3/uL — ABNORMAL HIGH (ref 1.4–7.0)
Neutrophils: 72 %
Platelets: 253 10*3/uL (ref 150–450)
RBC: 1.72 x10E6/uL — CL (ref 4.14–5.80)
RDW: 20.7 % — ABNORMAL HIGH (ref 11.6–15.4)
WBC: 12.2 10*3/uL — ABNORMAL HIGH (ref 3.4–10.8)

## 2022-10-17 LAB — CBC
HCT: 23.4 % — ABNORMAL LOW (ref 39.0–52.0)
Hemoglobin: 7.5 g/dL — ABNORMAL LOW (ref 13.0–17.0)
MCH: 30.4 pg (ref 26.0–34.0)
MCHC: 32.1 g/dL (ref 30.0–36.0)
MCV: 94.7 fL (ref 80.0–100.0)
Platelets: 216 10*3/uL (ref 150–400)
RBC: 2.47 MIL/uL — ABNORMAL LOW (ref 4.22–5.81)
RDW: 20 % — ABNORMAL HIGH (ref 11.5–15.5)
WBC: 7.5 10*3/uL (ref 4.0–10.5)
nRBC: 4.1 % — ABNORMAL HIGH (ref 0.0–0.2)

## 2022-10-17 LAB — BASIC METABOLIC PANEL
BUN/Creatinine Ratio: 24 (ref 10–24)
BUN: 34 mg/dL — ABNORMAL HIGH (ref 8–27)
CO2: 22 mmol/L (ref 20–29)
Calcium: 8.7 mg/dL (ref 8.6–10.2)
Chloride: 105 mmol/L (ref 96–106)
Creatinine, Ser: 1.39 mg/dL — ABNORMAL HIGH (ref 0.76–1.27)
Glucose: 209 mg/dL — ABNORMAL HIGH (ref 70–99)
Potassium: 3.8 mmol/L (ref 3.5–5.2)
Sodium: 141 mmol/L (ref 134–144)
eGFR: 51 mL/min/{1.73_m2} — ABNORMAL LOW (ref >59)

## 2022-10-17 LAB — HEMOGLOBIN AND HEMATOCRIT, BLOOD
HCT: 28 % — ABNORMAL LOW (ref 39.0–52.0)
Hemoglobin: 9.2 g/dL — ABNORMAL LOW (ref 13.0–17.0)

## 2022-10-17 LAB — PREPARE RBC (CROSSMATCH)

## 2022-10-17 LAB — MRSA NEXT GEN BY PCR, NASAL: MRSA by PCR Next Gen: NOT DETECTED

## 2022-10-17 SURGERY — EGD (ESOPHAGOGASTRODUODENOSCOPY)
Anesthesia: Monitor Anesthesia Care

## 2022-10-17 MED ORDER — LIDOCAINE 2% (20 MG/ML) 5 ML SYRINGE
INTRAMUSCULAR | Status: DC | PRN
  Administered 2022-10-17: 80 mg via INTRAVENOUS

## 2022-10-17 MED ORDER — PANTOPRAZOLE SODIUM 40 MG PO TBEC
40.0000 mg | DELAYED_RELEASE_TABLET | Freq: Two times a day (BID) | ORAL | Status: DC
  Administered 2022-10-17 – 2022-10-20 (×6): 40 mg via ORAL
  Filled 2022-10-17 (×6): qty 1

## 2022-10-17 MED ORDER — PHENYLEPHRINE HCL (PRESSORS) 10 MG/ML IV SOLN
INTRAVENOUS | Status: DC | PRN
Start: 2022-10-17 — End: 2022-10-17
  Administered 2022-10-17 (×4): 80 ug via INTRAVENOUS

## 2022-10-17 MED ORDER — PANTOPRAZOLE SODIUM 40 MG IV SOLR: 40.0000 mg | INTRAVENOUS | Status: DC

## 2022-10-17 MED ORDER — PROPOFOL 10 MG/ML IV BOLUS
INTRAVENOUS | Status: DC | PRN
  Administered 2022-10-17: 40 mg via INTRAVENOUS
  Administered 2022-10-17 (×2): 20 mg via INTRAVENOUS
  Administered 2022-10-17 (×2): 30 mg via INTRAVENOUS
  Administered 2022-10-17: 60 mg via INTRAVENOUS
  Administered 2022-10-17 (×2): 20 mg via INTRAVENOUS

## 2022-10-17 MED ORDER — SODIUM CHLORIDE 0.9% IV SOLUTION
Freq: Once | INTRAVENOUS | Status: AC
  Administered 2022-10-17: 1000 mL via INTRAVENOUS

## 2022-10-17 MED ORDER — LACTATED RINGERS IV SOLN: INTRAVENOUS | Status: DC | PRN

## 2022-10-17 MED ORDER — SODIUM CHLORIDE 0.9 % IV SOLN: INTRAVENOUS | Status: DC

## 2022-10-17 NOTE — Assessment & Plan Note (Signed)
Patient comes in for symptomatic anemia found to have positive Hemoccult test, concerning for GI bleed.  GI performed an endoscopy on 9/19 and believe that the melena may come from the non-bleeding ulcer in the duodenum.  - Per GI Op note: Continue PO PPI BID for 8 weeks, then once daily indefinitely. - GI following appreciate recs -- Biopsies collected.

## 2022-10-17 NOTE — Assessment & Plan Note (Signed)
Rosuvastatin 5 mg every day

## 2022-10-17 NOTE — Assessment & Plan Note (Signed)
Patient comes in with weeklong symptoms of weakness and fatigue, had Hgb 4.8 on admission. Patient was also Hemoccult positive on admission. Patient has history of anemia, multifactorial in nature (IDA, hemolytic, chronic disease).  Patient is followed by hematology oncology, last seen in August.  Patient is hemodynamically stable. Has received 3 units during this hospitalization so far. - Transfused 1 units PRBC (9/19) - Posttransfusion H&H - 9.2 - AM CBC

## 2022-10-17 NOTE — Assessment & Plan Note (Signed)
Pt found to have a non-bleeding ulcer.

## 2022-10-17 NOTE — Consult Note (Addendum)
Cardiology Consultation   Arthur [name] ID: Arthur [name] MRN: [id number]; DOB: 11-Nov-1940  Admit date: 10/15/2022 Date of Consult: 10/17/2022  PCP:  Arthur Living, MD   [facility] Providers Cardiologist:  Arthur Red, MD   {  Arthur [name] Profile:   Arthur [name] is a 82 y.o. male with a hx of cardiac arrest 2023, CAD status post DES to LAD, PE/DVT with IVC filter, stage IV lung cancer, CKD, anemia, CVA 2023, hypertension, type 2 diabetes, hyperlipidemia who is being seen 10/17/2022 for the evaluation of medication reconciliation at the request of Arthur [name].  History of Present Illness:   Arthur [name] has previous admission in January 2023 in which Arthur [name] initially presented with shortness of breath.  Arthur [name] was found to have multiple pulm embolism.  While in the ER Arthur [name] had cardiac arrest requiring CPR and ACLS with eventual ROSC. EKG showing possible VT prior to arrest.  Arthur [name] was taken to the Cath Lab where Arthur [name] had DES to a 80% proximal LAD lesion.  CT during that time had noted multiple lung nodules concerning for metastatic disease.  Arthur [name] has maintained normal EF without any heart failure symptoms.  Overall stable coronary artery disease without any complaints of anginal symptoms or interferences with ADLs.  Of note regarding Arthur [name] anticoagulation history: Arthur [name] has history of PE/DVT since 2017 and had initially been on Xarelto however this was discontinued at some point in 2018.  Arthur [name] had Arthur [name] cardiac arrest with diagnosis of PE and had transitioned on Eliquis 5 mg twice daily.  Then following 2 months after Arthur [name] cardiac arrest in February 2023 Arthur [name] was diagnosed with ischemic stroke with embolic showers with the largest in the left PCA and bilateral lower extremity DVTs thought to be an apixaban failure and had been on Lovenox and Plavix.  A month after in March Arthur [name] had left thigh hematomas outside the hospital that may have been contributed to poor technique however this was eventually  discontinued.  Then Arthur [name] got an IVC filter placed in April 2023.  09/25/2021 had lower leg extremity DVT still present.  Dr. Shirline [name] from oncology had started reduced dose of Eliquis 2.5 mg twice daily and Arthur [name] has been maintained on this since.  Currently Arthur [name] is being evaluated for acute symptomatic anemia.  Has had progressive issues with anemia without obvious signs of acute bleed over the past 3 to 4 months requiring multiple blood transfusions x 3.  In the office Arthur [name] was noted to have a hemoglobin as low as 4.8 and was sent to the emergency room where Arthur [name] has gotten an additional 2 blood transfusions.  Hemoglobin now 9.4.  Today Arthur [name] underwent endoscopy.  Results showed large non bleeding duodenal ulcer.  Plans with repeat EGD in 6 to 8 weeks.  Cardiology has been asked to evaluate for ongoing need for Plavix.  Overall from a cardiac standpoint Arthur [name] does very well and asymptomatic without any complaints of shortness of breath, chest pain, peripheral edema, dizziness, orthopnea.   Of note Arthur [name] has no documented history of atrial fibrillation however in previous echocardiogram Arthur [name] was noted to be in this arrhythmia.  Today's EKG also shows evidence of atrial fibrillation however unable to view telemetry for further identification.   Past Medical History:  Diagnosis Date   Diabetes mellitus without complication (HCC)    High cholesterol    Hypertension    lung ca    Normal nuclear stress test 01/29/2008   stress perfusion study apparently in 2010  in [facility] which Arthur [name] said was negative.    Past Surgical History:  Procedure Laterality Date   CATARACT EXTRACTION, BILATERAL  2006   CORONARY STENT INTERVENTION N/A 02/06/2021   Procedure: CORONARY STENT INTERVENTION;  Surgeon: Arthur Crafts, MD;  Location: [facility] INVASIVE CV LAB;  Service: Cardiovascular;  Laterality: N/A;   CORONARY ULTRASOUND/IVUS N/A 02/06/2021   Procedure: Intravascular Ultrasound/IVUS;  Surgeon: Arthur Crafts, MD;  Location: [facility] INVASIVE CV LAB;  Service: Cardiovascular;  Laterality: N/A;   FINE NEEDLE ASPIRATION  03/21/2021   Procedure: FINE NEEDLE ASPIRATION;  Surgeon: Arthur Igo, DO;  Location: [facility] ENDOSCOPY;  Service: Pulmonary;;   IR RADIOLOGIST EVAL & MGMT  12/24/2021   KNEE SURGERY  2005   LEFT HEART CATH AND CORONARY ANGIOGRAPHY N/A 02/06/2021   Procedure: LEFT HEART CATH AND CORONARY ANGIOGRAPHY;  Surgeon: Arthur Crafts, MD;  Location: [facility] INVASIVE CV LAB;  Service: Cardiovascular;  Laterality: N/A;   VIDEO BRONCHOSCOPY WITH ENDOBRONCHIAL ULTRASOUND Bilateral 03/21/2021   Procedure: VIDEO BRONCHOSCOPY WITH ENDOBRONCHIAL ULTRASOUND;  Surgeon: Arthur Igo, DO;  Location: [facility] ENDOSCOPY;  Service: Pulmonary;  Laterality: Bilateral;    Inpatient Medications: Scheduled Meds:  alectinib  600 mg Oral BID WC   Fe Fum-Vit C-Vit B12-FA  1 capsule Oral Daily   pantoprazole  40 mg Oral BID   rosuvastatin  5 mg Oral Daily   Continuous Infusions:  PRN Meds: furosemide, linaclotide  Allergies:    Allergies  Allergen Reactions   Ace Inhibitors Cough   Iohexol     Severe ATN after CT with contrast May 2023 - creatinine peaked near 8. Did not require HD.    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 Arthur [name] is married with 3 children. Arthur [name]'s never smoked cigarettes and does not drink alcohol.            Social Determinants of Health   Financial Resource Strain: Not on file  Food  Insecurity: No Food Insecurity (08/09/2022)   Hunger Vital Sign    Worried About Running Out of Food in the Last Year: Never true    Ran Out of Food in the Last Year: Never true  Transportation Needs: Unmet Transportation Needs (08/09/2022)   PRAPARE - Administrator, Civil Service (Medical): Yes    Lack of Transportation (Non-Medical): Yes  Physical Activity: Not on file  Stress: Not on file  Social Connections: Not on file  Intimate Partner Violence: Not At Risk (08/09/2022)   Humiliation, Afraid, Rape, and Kick questionnaire    Fear of Current or Ex-Partner: No    Emotionally Abused: No    Physically Abused: No    Sexually Abused: No    Family History: Family History  Problem Relation Age of Onset   Coronary artery disease Father    Coronary artery disease Mother  ROS:  Please see the history of present illness.   All other ROS reviewed and negative.     Physical Exam/Data:   Vitals:   10/17/22 1140 10/17/22 1150 10/17/22 1229 10/17/22 1450  BP: (!) 103/46 (!) 110/54  128/85  Pulse: 72 75  82  Resp: 12 18  20   Temp:   98.3 F (36.8 C) (!) 97.4 F (36.3 C)  TempSrc:   Oral Oral  SpO2: 97% 95%  95%    Intake/Output Summary (Last 24 hours) at 10/17/2022 1523 Last data filed at 10/17/2022 1119 Gross per 24 hour  Intake 920.67 ml  Output --  Net 920.67 ml      10/08/2022   10:12 AM 10/03/2022    2:21 PM 09/06/2022   11:07 AM  Last 3 Weights  Weight (lbs) 157 lb 3.2 oz -- --  Weight (kg) 71.305 kg -- --     There is no height or weight on file to calculate BMI.  General:  Well nourished, well developed, in no acute distress HEENT: normal Neck: no JVD Vascular: No carotid bruits; Distal pulses 2+ bilaterally Cardiac: Irregularly irregular Lungs:  clear to auscultation bilaterally, no wheezing, rhonchi or rales  Abd: soft, nontender, no hepatomegaly  Ext: no edema Musculoskeletal:  No deformities, BUE and BLE strength normal and equal Skin: warm  and dry  Neuro:  CNs 2-12 intact, no focal abnormalities noted Psych:  Normal affect   EKG:  The EKG was personally reviewed and demonstrates: Atrial fibrillation heart rate 77 Telemetry:  Telemetry was personally reviewed and demonstrates: Arthur [name] is not on central monitoring however on Arthur [name] telemetry box it does appear to be somewhat of a irregular rhythm however P waves can be appreciated intermittently.  Not great for identifying rhythm.  Relevant CV Studies: Left heart catheterization 02/06/2021 Ost LAD to Prox LAD lesion is 80% stenosed.   A drug-eluting stent was successfully placed using a STENT ONYX FRONTIER 3.5X15, postdailted to > 4 mm and optimized with IVUS.   Post intervention, there is a 0% residual stenosis.   1st Diag lesion is 90% stenosed.   Balloon angioplasty was performed through the stent struts to the jailed 1st diagonal using a BALLN SAPPHIRE 2.0X12.   Post intervention, there is a 40% residual stenosis.   Mid Cx lesion is 25% stenosed.   LV end diastolic pressure is normal.   There is no aortic valve stenosis.   From the right radial, it was difficult to not selectively engage the circumflex due to the left main being short.  EBU 3 guide catheter was used by anchoring the guide using circumflex wire, pulling back the guide, and then wiring the LAD.   Complex, proximal LAD lesion.  Due to high risk location, this needed to be treated.  Successful IVUS guided stenting of the proximal LAD.  PTCA of the diagonal which was jailed.  Arthur [name] tolerated the procedure well.   Resume heparin 6 hours after sheath removal.  Clopidogrel was used since Arthur [name] will likely be going home on Eliquis or Xarelto.  No aspirin to reduce bleeding risk.  Echocardiogram 06/21/2021 1. Left ventricular ejection fraction, by estimation, is 65 to 70%. Left  ventricular ejection fraction by 2D MOD biplane is 70.2 %. The left  ventricle has normal function. The left ventricle has no regional wall  motion  abnormalities. There is moderate  asymmetric left ventricular hypertrophy of the basal-septal segment. Left  ventricular diastolic function could not be evaluated.  2. Right ventricular systolic function is normal. The right ventricular  size is normal. There is moderately elevated pulmonary artery systolic  pressure. The estimated right ventricular systolic pressure is 50.8 mmHg.   3. Right atrial size was mildly dilated.   4. The pericardial effusion is anterior to the right ventricle.   5. The mitral valve is grossly normal. No evidence of mitral valve  regurgitation.   6. Heavy calcification on the tip of the non-coronary cusp of the aortic  valve. The aortic valve is calcified. Aortic valve regurgitation is  trivial. Aortic valve sclerosis/calcification is present, without any  evidence of aortic stenosis. Aortic valve  mean gradient measures 8.8 mmHg.   7. The inferior vena cava is dilated in size with <50% respiratory  variability, suggesting right atrial pressure of 15 mmHg.   8. Rhythm strip during this exam demostrated atrial fibrillation.   Comparison(s): Changes from prior study are noted. 03/18/2021: LVEF 55-60%,  severe ASH, small RV anterior pericardial effusion.    Laboratory Data:  High Sensitivity Troponin:  No results for input(s): "TROPONINIHS" in the last 720 hours.   Chemistry Recent Labs  Lab 10/15/22 1229 10/15/22 2327  NA 141 137  K 3.8 3.8  CL 105 104  CO2 22 23  GLUCOSE 209* 146*  BUN 34* 29*  CREATININE 1.39* 1.39*  CALCIUM 8.7 8.1*  GFRNONAA  --  51*  ANIONGAP  --  10    Recent Labs  Lab 10/15/22 2327  PROT 5.3*  ALBUMIN 2.5*  AST 32  ALT 21  ALKPHOS 60  BILITOT 1.3*   Lipids No results for input(s): "CHOL", "TRIG", "HDL", "LABVLDL", "LDLCALC", "CHOLHDL" in the last 168 hours.  Hematology Recent Labs  Lab 10/15/22 2327 10/16/22 0910 10/16/22 2045 10/17/22 0440 10/17/22 0957  WBC 11.2* 9.4  --  7.5  --   RBC 1.64* 2.67*  --   2.47*  --   HGB 4.8* 8.3* 8.6* 7.5* 9.2*  HCT 16.5* 25.7* 26.2* 23.4* 28.0*  MCV 100.6* 96.3  --  94.7  --   MCH 29.3 31.1  --  30.4  --   MCHC 29.1* 32.3  --  32.1  --   RDW 23.5* 19.4*  --  20.0*  --   PLT 233 217  --  216  --    Thyroid No results for input(s): "TSH", "FREET4" in the last 168 hours.  BNPNo results for input(s): "BNP", "PROBNP" in the last 168 hours.  DDimer No results for input(s): "DDIMER" in the last 168 hours.   Radiology/Studies:  No results found.   Assessment and Plan:   Symptomatic anemia requiring multiple blood transfusions CAD status post DES to LAD Cardiac arrest Hx of recurrent DVT/PE Ischemic CVA Admitted with a hemoglobin of 4.8.  Required 2 blood transfusions here (total of 5 now), with no signs of acute bleeding.  Underwent endoscopy today with findings suggestive of non bleeding large duodenal ulcer.  Currently Arthur [name] is on Plavix and Eliquis for previous cardiac arrest with proximal LAD lesion and stent placement in January 2023.  History of PEs.  Very difficult situation as Arthur [name]'s high risk for MACE, but concerning signs of possible bleeding issues although not apparent.  Discussed with MD and our preference generally would be to continue Plavix until clear contraindications of a acute GI bleed indicating discontinuation.   Regardless may resume blood thinners tomorrow per GI.  Continue rosuvastatin.  Anemia work up per primary team  Chronic anticoagulation  Has  multiple indications for anticoagulation given recurrent episodes of DVT/PE along with new diagnosis of atrial fibrillation.  Arthur [name] has history of being on Xarelto however this was stopped previously in 2018.  History of considered Eliquis failure due to subsequent acute ischemic stroke with embolic showers to the left PCA and bilateral lower extremity DVTs.  Discussed with pharmacist and given Arthur [name] recurrent episodes of thromboembolic events would actually require full dose of anticoagulation.   Preference would be between Xarelto and potentially Lovenox.  Will defer to heme-onc about DOAC choice and if to continue Eliquis.   Newly diagnosed Afib Previous echocardiogram last year did note possible atrial fibrillation.  Arthur [name] is currently rate controlled now without any rate medications.  Heart rates in the 80s and has been soft/hypotensive.  Previously on Atenolol. Per chart review "Category C drug-drug interaction between Alecensa and atenolol both agents can cause bradycardia".  Given rate control and setting of anemia and fatigue will defer use of BB for now.  Able to continue eliquis tomorrow per GI.  HLD LDL 79 10 months ago. On statin     Risk Assessment/Risk Scores:   CHA2DS2-VASc Score = 5  This indicates a 7.2% annual risk of stroke. The Arthur [name]'s score is based upon: CHF History: 0 HTN History: 1 Diabetes History: 1 Stroke History: 0 Vascular Disease History: 1 Age Score: 2 Gender Score: 0    For questions or updates, please contact [facility] Please consult [email or web address] for contact info under    Signed, Abagail Kitchens, PA-C  10/17/2022 3:23 PM

## 2022-10-17 NOTE — Assessment & Plan Note (Signed)
Diet  Controlled.

## 2022-10-17 NOTE — Anesthesia Preprocedure Evaluation (Signed)
Anesthesia Evaluation  Patient identified by MRN, date of birth, ID band Patient awake    Reviewed: Allergy & Precautions, H&P , NPO status , [name] Chart, lab work & pertinent test results  Airway Mallampati: II  TM Distance: >3 FB Neck ROM: Full    Dental no notable dental hx.    Pulmonary neg pulmonary ROS Adenocarcinoma of right lung, stage 4 (   Pulmonary exam normal breath sounds clear to auscultation       Cardiovascular hypertension, + CAD and + Cardiac Stents  Normal cardiovascular exam Rhythm:Regular Rate:Normal  1. Left ventricular ejection fraction, by estimation, is 65 to 70%. Left  ventricular ejection fraction by 2D MOD biplane is 70.2 %. The left  ventricle has normal function. The left ventricle has no regional wall  motion abnormalities. There is moderate  asymmetric left ventricular hypertrophy of the basal-septal segment. Left  ventricular diastolic function could not be evaluated.   2. Right ventricular systolic function is normal. The right ventricular  size is normal. There is moderately elevated pulmonary artery systolic  pressure. The estimated right ventricular systolic pressure is 50.8 mmHg.   3. Right atrial size was mildly dilated.   4. The pericardial effusion is anterior to the right ventricle.   5. The mitral valve is grossly normal. No evidence of mitral valve  regurgitation.   6. Heavy calcification on the tip of the non-coronary cusp of the aortic  valve. The aortic valve is calcified. Aortic valve regurgitation is  trivial. Aortic valve sclerosis/calcification is present, without any  evidence of aortic stenosis. Aortic valve  mean gradient measures 8.8 mmHg.     Neuro/Psych negative neurological ROS  negative psych ROS   GI/Hepatic negative GI ROS, Neg liver ROS,,,  Endo/Other  negative endocrine ROSdiabetes    Renal/GU Renal InsufficiencyRenal disease  negative genitourinary    Musculoskeletal negative musculoskeletal ROS (+)    Abdominal   Peds negative pediatric ROS (+)  Hematology  (+) Blood dyscrasia, anemia   Anesthesia Other Findings   Reproductive/Obstetrics negative OB ROS                             Anesthesia Physical Anesthesia Plan  ASA: 3  Anesthesia Plan: MAC   Post-op Pain Management: Minimal or no pain anticipated   Induction: Intravenous  PONV Risk Score and Plan: 1 and Propofol infusion and Treatment may vary due to age or medical condition  Airway Management Planned: Nasal Cannula  Additional Equipment:   Intra-op Plan:   Post-operative Plan:   Informed Consent: I have reviewed the patients History and Physical, chart, labs and discussed the procedure including the risks, benefits and alternatives for the proposed anesthesia with the patient or authorized representative who has indicated his/her understanding and acceptance.     Dental advisory given  Plan Discussed with: CRNA and Surgeon  Anesthesia Plan Comments:        Anesthesia Quick Evaluation

## 2022-10-17 NOTE — Progress Notes (Signed)
Met patient post-endoscopy for a post-op check. Pt's son present. Cardiology was present and explaining their rationale for continuing both eliquis and plavix. Their hope was that the not-currently bleeding ulcer identified by GI is the source for the bleeding and that the acid control via a PPI would slow or stop the acute blood loss.   Subjective: Pt says that the endoscopy went fine and he feels tired but well. He was frustrated that he had not had a bed ready after endoscopy, he lost his room in the ED while he was gone for exam. Spent most of the day in the hallway.  Objective: VITALS: Reviewed. GEN: Pt is alert, somewhat ill-appearing man. Sitting upright in recliner with his feet up. HEENT    -Head: [place]/AT;    -Eyes: EOMI. Some redness, patient states this is at baseline. CV: Telemetry was showing atrial fibrillation, irregularly irregular rhythm auscultated, no m/r/g. LUNGS: CTAB, no w/r/c. ABD: Soft, NT/ND, NBS. SKIN: Warm, well perfused. No skin rashes or abnormal lesions. EXT: No clubbing, cyanosis, or edema. NEURO: Pt is able to ambulate with walker.  PSYCH: Good Judgment. oriented to person, place, situation, some trouble with date, but knew day of week. . Normal memory, mood, and affect.  Assessment: Pt appears well post-operatively. No acute concerns at this time.  Plan: Start pt back on regular diet.  Recs for anti-coagulation per cardiology recommendations.  Re-check his CBC, Hepatic function, and BMP with morning labs tomorrow.

## 2022-10-17 NOTE — Anesthesia Postprocedure Evaluation (Signed)
Anesthesia Post Note  Patient: Arthur [name]  Procedure(s) Performed: ESOPHAGOGASTRODUODENOSCOPY (EGD) BIOPSY     Patient location during evaluation: PACU Anesthesia Type: MAC Level of consciousness: awake and alert Pain management: pain level controlled Vital Signs Assessment: post-procedure vital signs reviewed and stable Respiratory status: spontaneous breathing, nonlabored ventilation, respiratory function stable and patient connected to nasal cannula oxygen Cardiovascular status: stable and blood pressure returned to baseline Postop Assessment: no apparent nausea or vomiting Anesthetic complications: no  No notable events documented.  Last Vitals:  Vitals:   10/17/22 1139 10/17/22 1140  BP: (!) 111/52 (!) 103/46  Pulse: 65 72  Resp: 13 12  Temp:    SpO2: 97% 97%    Last Pain:  Vitals:   10/17/22 1129  TempSrc: Temporal  PainSc:                  Anelly Samarin S

## 2022-10-17 NOTE — Interval H&P Note (Signed)
History and Physical Interval Note:  10/17/2022 11:01 AM  Arthur [name]  has presented today for surgery, with the diagnosis of Acute on chronic anemia, fecal occult blood test.  The various methods of treatment have been discussed with the patient and family. After consideration of risks, benefits and other options for treatment, the patient has consented to  Procedure(s): ESOPHAGOGASTRODUODENOSCOPY (EGD) (N/A) GIVENS CAPSULE STUDY (N/A) as a surgical intervention.  The patient's history has been reviewed, patient examined, no change in status, stable for surgery.  I have reviewed the patient's chart and labs.  Questions were answered to the patient's satisfaction.    Patient reports seeing black stools overnight with the bowel prep for the VCE.  Hgb dropped to 7.5 and he was given an addition unit of blood this morning with appropriate rise in hgb.  Jenel Lucks

## 2022-10-17 NOTE — Assessment & Plan Note (Signed)
--   Pt on Eliquis 2.5 mg BID

## 2022-10-17 NOTE — Transfer of Care (Signed)
Immediate Anesthesia Transfer of Care Note  Patient: Arthur [name]  Procedure(s) Performed: ESOPHAGOGASTRODUODENOSCOPY (EGD) BIOPSY  Patient Location: PACU and Endoscopy Unit  Anesthesia Type:MAC  Level of Consciousness: sedated and drowsy  Airway & Oxygen Therapy: Patient Spontanous Breathing and Patient connected to nasal cannula oxygen  Post-op Assessment: Report given to RN and Post -op Vital signs reviewed and stable  Post vital signs: Reviewed and stable  Last Vitals:  Vitals Value Taken Time  BP 82/38 10/17/22 1129  Temp 97.2   Pulse 72 10/17/22 1129  Resp 14 10/17/22 1129  SpO2 96 % 10/17/22 1129    Last Pain:  Vitals:   10/17/22 1020  TempSrc: Temporal  PainSc: 0-No pain         Complications: No notable events documented.

## 2022-10-17 NOTE — Assessment & Plan Note (Deleted)
Patient comes in with weeklong symptoms of weakness and fatigue, had Hgb 4.8 on admission. Patient was also Hemoccult positive on admission.  Patient has history of anemia, multifactorial in nature (IDA, hemolytic, chronic disease).  Patient is followed by hematology oncology, last seen in August.  Patient is hemodynamically stable. Has received 3 units during this hospitalization so far. - Transfused 1 units PRBC (9/19) - Posttransfusion H&H - AM CBC - GI following, appreciate recs

## 2022-10-17 NOTE — Assessment & Plan Note (Deleted)
Patient comes in for symptomatic anemia found to have positive Hemoccult test, concerning for GI bleed.  GI has been consulted appreciate their recommedations.  - Protonix - GI following appreciate recs - N.p.o.

## 2022-10-17 NOTE — Plan of Care (Signed)

## 2022-10-17 NOTE — Op Note (Addendum)
[facility] Patient Name: Arthur [name] Procedure Date : 10/17/2022 MRN: [id number] Attending MD: Dub Amis. Tomasa Rand , MD, [id number] Date of Birth: 1940-11-01 CSN: [id number] Age: 82 Admit Type: Outpatient Procedure:                Upper GI endoscopy Indications:              Iron deficiency anemia, Melena Providers:                Lorin Picket E. Tomasa Rand, MD, Vicki Mallet, RN, Salley Scarlet, Technician, Sorin Munteanu, Scientist, clinical (histocompatibility and immunogenetics) Referring MD:              Medicines:                Monitored Anesthesia Care Complications:            No immediate complications. Estimated Blood Loss:     Estimated blood loss was minimal. Procedure:                Pre-Anesthesia Assessment:                           - Prior to the procedure, a History and Physical                            was performed, and patient medications and                            allergies were reviewed. The patient's tolerance of                            previous anesthesia was also reviewed. The risks                            and benefits of the procedure and the sedation                            options and risks were discussed with the patient.                            All questions were answered, and informed consent                            was obtained. Prior Anticoagulants: The patient has                            taken Plavix (clopidogrel), last dose was 2 days                            prior to procedure. ASA Grade Assessment: III - A                            patient with severe systemic disease. After  reviewing the risks and benefits, the patient was                            deemed in satisfactory condition to undergo the                            procedure.                           After obtaining informed consent, the endoscope was                            passed under direct vision. Throughout the                             procedure, the patient's blood pressure, pulse, and                            oxygen saturations were monitored continuously. The                            [id number]-[id number] ([id number]) Olympus endoscope was introduced                            through the mouth, and advanced to the second part                            of duodenum. The upper GI endoscopy was                            accomplished without difficulty. The patient                            tolerated the procedure well. Scope In: Scope Out: Findings:      The examined portions of the nasopharynx, oropharynx and larynx were       normal.      The examined esophagus was normal.      A small amount of pill residue was found in the gastric body.      The exam of the stomach was otherwise normal.      Biopsies were taken with a cold forceps in the gastric body and in the       gastric antrum for Helicobacter pylori testing. Estimated blood loss was       minimal.      One non-bleeding cratered duodenal ulcer with no stigmata of bleeding       was found in the duodenal bulb. The lesion was 10 mm in largest       dimension. Biopsies were taken with a cold forceps for histology.       Estimated blood loss was minimal.      A large non-bleeding diverticulum was found in the first portion of the       duodenum.      The exam of the duodenum was otherwise normal.      Biopsies for histology were taken with a cold forceps in the second  portion of the duodenum for evaluation of celiac disease. Estimated       blood loss was minimal. Impression:               - The examined portions of the nasopharynx,                            oropharynx and larynx were normal.                           - Normal esophagus.                           - A small amount of pill residue in the stomach.                           - Non-bleeding duodenal ulcer with no stigmata of                            bleeding. Biopsied. This is the likely source of                             the patient's melena and acute drop in hemoglobin.                           - Non-bleeding duodenal diverticulum.                           - Biopsies were taken with a cold forceps for                            Helicobacter pylori testing.                           - Biopsies were taken with a cold forceps for                            evaluation of celiac disease.                           - Capsule endoscopy not pursued due to bleeding                            source identified in duodenum. Moderate Sedation:      N/A Recommendation:           - Return patient to hospital ward for ongoing care.                           - Resume previous diet.                           - Resume Eliquis (apixaban) tomorrow and Plavix                            (clopidogrel) tomorrow at prior doses. Recommend  following up with cardiology to discuss necessity                            of continuing Plavix.                           - Await pathology results.                           - Consider repeat EGD in 6-8 weeks to assess                            healing of duodenal ulcer if anemia does not                            improve.                           - Avoid NSAIDs indefinitely                           - Continue PO PPI BID for 8 weeks, then once daily                            indefinitely.                           - GI will sign off at this time. Please reconsult                            if there is concern for ongoing bleeding Procedure Code(s):        --- Professional ---                           [phone], Esophagogastroduodenoscopy, flexible,                            transoral; with biopsy, single or multiple Diagnosis Code(s):        --- Professional ---                           Z36.6YQI, Foreign body in stomach, initial encounter                           K26.9, Duodenal ulcer, unspecified as acute or                             chronic, without hemorrhage or perforation                           D50.9, Iron deficiency anemia, unspecified                           K92.1, Melena (includes Hematochezia)  K57.10, Diverticulosis of small intestine without                            perforation or abscess without bleeding CPT copyright 2022 American [employer] Association. All rights reserved. The codes documented in this report are preliminary and upon coder review may  be revised to meet current compliance requirements. Cleburne Savini E. Tomasa Rand, MD 10/17/2022 11:37:59 AM This report has been signed electronically. Number of Addenda: 0

## 2022-10-17 NOTE — ED Notes (Addendum)
Assumed care of pt. Pt calm in room with blood transfusing. Tolerating well, VSS. Daughter at bedside. Awaiting endo and admit bed.

## 2022-10-17 NOTE — Progress Notes (Signed)
Daily Progress Note Intern Pager: [phone]  Patient name: Arthur [name] Medical record number: [id number] Date of birth: [birth year] Age: 82 y.o. Gender: male  Primary Care Provider: Carney Living, MD Consultants: GI, Cardiology Code Status: Full  Pt Overview and Major Events to Date:  Arthur [name] is a 82 y.o. male presenting with symptomatic anemia and positive hemoccult test. 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 Differential for presentation of this includes GI bleed, vs hemolytic anemia, vs IDA vs anemia of chronic disease, as patient has hx of anemia that is thought to be stemming from these possible modalities.  Assessment and Plan: Assessment & Plan GI bleed Patient comes in for symptomatic anemia found to have positive Hemoccult test, concerning for GI bleed.  GI performed an endoscopy on 9/19 and believe that the melena may come from the non-bleeding ulcer in the duodenum.  - Per GI Op note: Continue PO PPI BID for 8 weeks, then once daily indefinitely. - GI following appreciate recs -- Biopsies collected.  Symptomatic anemia Patient comes in with weeklong symptoms of weakness and fatigue, had Hgb 4.8 on admission. Patient was also Hemoccult positive on admission. Patient has history of anemia, multifactorial in nature (IDA, hemolytic, chronic disease).  Patient is followed by hematology oncology, last seen in August.  Patient is hemodynamically stable. Has received 3 units during this hospitalization so far. - Transfused 1 units PRBC (9/19) - Posttransfusion H&H - 9.2 - AM CBC Adenocarcinoma of right lung, stage 4 (HCC) -- Alectinib 150mg  4 capsules BID, follows with [facility] Diabetes mellitus, type II (HCC) -- Diet Controlled History of pulmonary embolism -- Pt on Eliquis 2.5 mg BID Coronary artery disease due to lipid rich plaque --Rosuvastatin 5mg  every day Duodenal ulcer Pt found to have a non-bleeding  ulcer.   Chronic and Stable Problems:  Stage IV non-small cell carcinoma of lung -  Plavix 75mg  every day, held in setting of GI bleed Hx PE - Eliquis 2.5mg  BID, restarting DM2 - Diet Controlled    FEN/GI: Regular (vegetarian) diet VTE Prophylaxis: SCD's  Subjective:  Pt is with his daughter laying down in a hospital bed in the emergency department prior to his endoscopic procedure but after his 3rd transfusion. Exam conducted with PharmD Georgina Snell present. Pt is pleasant and alert. He is a a fair historian. He reports no acute events overnight, though he is frustrated that he was NPO much of yesterday and then again today. Apologized to patient. Patient reports no symptoms of dizziness, SOB, chest pain. No problems with N/V/D/C.   Objective: Temp:  [97.2 F (36.2 C)-98.4 F (36.9 C)] 97.4 F (36.3 C) (09/19 1450) Pulse Rate:  [61-85] 82 (09/19 1450) Resp:  [12-20] 20 (09/19 1450) BP: (79-130)/(38-85) 128/85 (09/19 1450) SpO2:  [94 %-100 %] 95 % (09/19 1450)  Physical Exam: General: Alert, conversant on exam. Oriented x4. Looks chronically ill.  Cardiovascular: Slightly irregular rhythm RR, no murmurs rubs or gallops appreciated Respiratory: Pt not in current distress. Lung fields CTAB. Abdomen: Pt NT/ND. No masses palpated. Extremities: No cyanosis, clubbing, or edema.  Laboratory: Most recent CBC Lab Results  Component Value Date   WBC 7.5 10/17/2022   HGB 9.2 (L) 10/17/2022   HCT 28.0 (L) 10/17/2022   MCV 94.7 10/17/2022   PLT 216 10/17/2022   Most recent BMP    Latest Ref Rng & Units 10/15/2022   11:27 PM  BMP  Glucose 70 - 99 mg/dL 865   BUN 8 - 23 mg/dL 29   Creatinine 7.84 - 1.24 mg/dL 6.96   Sodium 295 - 284 mmol/L 137   Potassium 3.5 - 5.1 mmol/L 3.8   Chloride 98 - 111 mmol/L 104   CO2 22 - 32 mmol/L 23   Calcium 8.9 - 10.3 mg/dL 8.1     EKG: some concern for AFIB, pt is already on anticoagulation.   Imaging/Diagnostic Tests: Endoscopy  9/19: Impression:              - The examined portions of the nasopharynx, oropharynx and larynx were normal. - Normal esophagus. - A small amount of pill residue in the stomach. - Non-bleeding duodenal ulcer with no stigmata of  bleeding. Biopsied. This is the likely source of the patient's melena and acute drop in hemoglobin. - Non-bleeding duodenal diverticulum. - Biopsies were taken with a cold forceps for Helicobacter pylori testing. - Biopsies were taken with a cold forceps for evaluation of celiac disease. - Capsule endoscopy not pursued due to bleeding source identified in duodenum.  Margaretmary Dys, MD 10/17/2022, 3:33 PM  PGY-1, [facility] FPTS Intern pager: [phone], text pages welcome Secure chat group [facility] Teaching Service

## 2022-10-17 NOTE — Assessment & Plan Note (Signed)
--   Alectinib 150mg  4 capsules BID, follows with [facility]

## 2022-10-17 NOTE — Evaluation (Signed)
Occupational Therapy Evaluation Patient Details Name: Arthur [name] MRN: [id number] DOB: 06-02-1940 Today's Date: 10/17/2022   History of Present Illness 82 y.o. male presenting with symptomatic anemia and positive hemoccult test.  PMH: Stage IV lung CA, prior PE, DVT, L drop foot.   Clinical Impression   Patient admitted for the diagnosis above.  PTA he lives at home with family, walks in/out of the home with a 2WRW, and continues to complete his own ADL.  Family assists with iADL and community mobility.  Currently he is needing light supervision to CGA for mobility and ADL completion.  OT will continue efforts in the acute setting and no HH OT is anticipated, but he may benefit from [facility] PT.  PT eval pending, and obviously that will be PT and the patient's decision.         If plan is discharge home, recommend the following: Assist for transportation;A little help with walking and/or transfers    Functional Status Assessment  Patient has had a recent decline in their functional status and demonstrates the ability to make significant improvements in function in a reasonable and predictable amount of time.  Equipment Recommendations  None recommended by OT    Recommendations for Other Services       Precautions / Restrictions Precautions Precautions: Fall Restrictions Weight Bearing Restrictions: No      Mobility Bed Mobility Overal bed mobility: Needs Assistance Bed Mobility: Supine to Sit     Supine to sit: Modified independent (Device/Increase time)          Transfers Overall transfer level: Needs assistance   Transfers: Sit to/from Stand Sit to Stand: Contact guard assist                  Balance Overall balance assessment: Needs assistance Sitting-balance support: Feet supported Sitting balance-Leahy Scale: Good     Standing balance support: Reliant on assistive device for balance Standing balance-Leahy Scale: Fair Standing balance comment: Can  static stand without the RW                           ADL either performed or assessed with clinical judgement   ADL       Grooming: Wash/dry hands;Wash/dry face;Contact guard assist;Standing               Lower Body Dressing: Contact guard assist;Sit to/from stand   Toilet Transfer: Contact guard assist;Rolling walker (2 wheels);Ambulation                   Vision Patient Visual Report: No change from baseline       Perception Perception: Within Functional Limits       Praxis Praxis: WFL       Pertinent Vitals/Pain Pain Assessment Pain Assessment: No/denies pain     Extremity/Trunk Assessment Upper Extremity Assessment Upper Extremity Assessment: Overall WFL for tasks assessed   Lower Extremity Assessment Lower Extremity Assessment: Defer to PT evaluation   Cervical / Trunk Assessment Cervical / Trunk Assessment: Normal   Communication Communication Communication: No apparent difficulties   Cognition Arousal: Alert Behavior During Therapy: WFL for tasks assessed/performed Overall Cognitive Status: Within Functional Limits for tasks assessed                                       General Comments   VSS on RA  Exercises     Shoulder Instructions      Home Living Family/patient expects to be discharged to:: Private residence Living Arrangements: Children Available Help at Discharge: Family;Available 24 hours/day Type of Home: House Home Access: Stairs to enter [employer] of Steps: 1   Home Layout: Able to live on main level with bedroom/bathroom;Two level     Bathroom Shower/Tub: Producer, television/film/video: Standard Bathroom Accessibility: Yes How Accessible: Accessible via walker Home Equipment: Shower seat;Hand held Programmer, systems (2 wheels);Cane - single point;Rollator (4 wheels)          Prior Functioning/Environment Prior Level of Function : Independent/Modified  Independent             Mobility Comments: using RW for mobility d/t L foot drop ADLs Comments: Reports able to toilet, bathe self - some assist for LB dressing. Family assist with IADLs. pt no longer drives        OT Problem List: Decreased activity tolerance;Impaired balance (sitting and/or standing)      OT Treatment/Interventions: Self-care/ADL training;Therapeutic activities;Balance training;DME and/or AE instruction;Patient/family education    OT Goals(Current goals can be found in the care plan section) Acute Rehab OT Goals Patient Stated Goal: Return home OT Goal Formulation: With patient Time For Goal Achievement: 10/31/22 Potential to Achieve Goals: Good ADL Goals Pt Will Perform Grooming: with modified independence;standing Pt Will Perform Lower Body Dressing: with modified independence;sit to/from stand Pt Will Transfer to Toilet: with modified independence;regular height toilet;ambulating  OT Frequency: Min 1X/week    Co-evaluation              AM-PAC OT "6 Clicks" Daily Activity     Outcome Measure Help from another person eating meals?: None Help from another person taking care of personal grooming?: A Little Help from another person toileting, which includes using toliet, bedpan, or urinal?: A Little Help from another person bathing (including washing, rinsing, drying)?: A Little Help from another person to put on and taking off regular upper body clothing?: None Help from another person to put on and taking off regular lower body clothing?: A Little 6 Click Score: 20   End of Session Equipment Utilized During Treatment: Rolling walker (2 wheels);Gait belt Nurse Communication: Mobility status  Activity Tolerance: Patient tolerated treatment well Patient left: in chair;with call bell/phone within reach;with family/visitor present  OT Visit Diagnosis: Unsteadiness on feet (R26.81)                Time: 1610-9604 OT Time Calculation (min): 22  min Charges:  OT General Charges $OT Visit: 1 Visit OT Evaluation $OT Eval Moderate Complexity: 1 Mod  10/17/2022  RP, OTR/L  Acute Rehabilitation Services  Office:  [phone]   Suzanna Obey 10/17/2022, 3:54 PM

## 2022-10-17 NOTE — Progress Notes (Signed)
Summary of [name] Anticoagulation, Antiplatelet, and Thrombotic History  04/2015 - diagnosed with PE and R lower extremity DVT and started on rivaroxaban  05/15/2016 - rivaroxaban discontinued, started on aspirin 81mg  daily  02/03/2021 - PEA cardiac arrest with CPR and ROSC > diagnosed with PE and started on heparin infusion and transitioned to apixaban 5mg  BID  02/06/2021 - LHC s/p PCI and initiated on clopidogrel + apixaban 5mg  BID  03/19/2021 - diagnosed with acute ischemic stroke due to embolic shower with largest at left PCA and bilateral lower extremity DVTs and considered to be apixaban failure and transitioned to enoxaparin + clopidogrel  04/27/2021 - L thigh hematoma at outside hospital and enoxaparin was discontinued   04/28/2021 - IVC filter placed at OSH, clopidogrel continued  09/25/2021 - LLE DVT still present (not on anticoagulation), clopidogrel continued  10/10/2021 - Dr. Arbutus Ped (oncology) started apixaban 2.5mg  BID at clinic visit  09/24/2022 - CT A/P confirms IVC filter is in place  10/15/2022 - admission to [facility] for acute GIB, symptomatic anemia (on apixaban 2.5mg  BID and clopidogrel)   Summary of patient history documented from chart review per request of primary team.  Wilburn Cornelia, PharmD, BCPS Clinical Pharmacist 10/17/2022 2:29 PM   Please refer to [facility] for pharmacy phone number

## 2022-10-17 NOTE — Progress Notes (Signed)
PCP hospital visit  Spoke with Mr Follen and his son.   He is feeling much better.  They are very appreciative of the great care from the inpt team, as am I.  Pauline Good MD

## 2022-10-17 NOTE — Evaluation (Signed)
Physical Therapy Evaluation Patient Details Name: Arthur [name] MRN: [id number] DOB: 08/05/1940 Today's Date: 10/17/2022  History of Present Illness  82 y.o. male presenting with symptomatic anemia and positive hemoccult test.  PMH: Stage IV lung CA, prior PE, DVT, L drop foot.  Clinical Impression  Pt admitted with/for the above problem.  Pt still not at baseline function, but much improved needing CGA for OOB activity.  Pt is still quite deconditioned and would benefit from HHPT.  Pt currently limited functionally due to the problems listed below.  (see problems list.)  Pt will benefit from PT to maximize function and safety to be able to get home safely with available assist.         If plan is discharge home, recommend the following: A little help with bathing/dressing/bathroom;Assistance with cooking/housework;Assist for transportation;Help with stairs or ramp for entrance   Can travel by private vehicle        Equipment Recommendations Rolling walker (2 wheels) (pt has a broken or "rickety" RW and would love a new on IF insurance will pay for one.)  Recommendations for Other Services       Functional Status Assessment Patient has had a recent decline in their functional status and demonstrates the ability to make significant improvements in function in a reasonable and predictable amount of time.     Precautions / Restrictions Precautions Precautions: Fall Restrictions Weight Bearing Restrictions: No      Mobility  Bed Mobility Overal bed mobility: Needs Assistance Bed Mobility: Supine to Sit     Supine to sit: Modified independent (Device/Increase time)          Transfers Overall transfer level: Needs assistance   Transfers: Sit to/from Stand Sit to Stand: Contact guard assist           General transfer comment: uses UE's appropriately    Ambulation/Gait Ambulation/Gait assistance: Contact guard assist Gait Distance (Feet): 110 Feet Assistive  device: Rolling walker (2 wheels) Gait Pattern/deviations: Step-through pattern   Gait velocity interpretation: <1.8 ft/sec, indicate of risk for recurrent falls   General Gait Details: generally steady, pt didn't have to step high on the left, but would have benefited from his AFO.  Generally slow with good use of the RW.  Stairs            Wheelchair Mobility     Tilt Bed    Modified Rankin (Stroke Patients Only)       Balance Overall balance assessment: Needs assistance Sitting-balance support: Feet supported Sitting balance-Leahy Scale: Good     Standing balance support: Reliant on assistive device for balance Standing balance-Leahy Scale: Fair Standing balance comment: Can statically stand without the RW                             Pertinent Vitals/Pain Pain Assessment Pain Assessment: No/denies pain    Home Living Family/patient expects to be discharged to:: Private residence Living Arrangements: Children Available Help at Discharge: Family;Available 24 hours/day Type of Home: House Home Access: Stairs to enter   [employer] of Steps: 1   Home Layout: Able to live on main level with bedroom/bathroom;Two level Home Equipment: Shower seat;Hand held Programmer, systems (2 wheels);Cane - single point;Rollator (4 wheels)      Prior Function Prior Level of Function : Independent/Modified Independent             Mobility Comments: using RW for mobility d/t L foot  drop ADLs Comments: Reports able to toilet, bathe self - some assist for LB dressing. Family assist with IADLs. pt no longer drives     Extremity/Trunk Assessment   Upper Extremity Assessment Upper Extremity Assessment: Generalized weakness    Lower Extremity Assessment Lower Extremity Assessment: Generalized weakness (L LE with drop foot and o/w weaker than R LE by a significant degree.)    Cervical / Trunk Assessment Cervical / Trunk Assessment: Normal   Communication   Communication Communication: No apparent difficulties  Cognition Arousal: Alert Behavior During Therapy: WFL for tasks assessed/performed Overall Cognitive Status: Within Functional Limits for tasks assessed                                          General Comments General comments (skin integrity, edema, etc.): vss    Exercises     Assessment/Plan    PT Assessment Patient needs continued PT services  PT Problem List Decreased strength;Decreased activity tolerance;Decreased balance;Decreased mobility;Decreased knowledge of use of DME       PT Treatment Interventions DME instruction;Gait training;Functional mobility training;Therapeutic activities;Patient/family education    PT Goals (Current goals can be found in the Care Plan section)  Acute Rehab PT Goals Patient Stated Goal: Home and Independent with my RW PT Goal Formulation: With patient Time For Goal Achievement: 10/24/22 Potential to Achieve Goals: Good    Frequency Min 1X/week     Co-evaluation               AM-PAC PT "6 Clicks" Mobility  Outcome Measure Help needed turning from your back to your side while in a flat bed without using bedrails?: None Help needed moving from lying on your back to sitting on the side of a flat bed without using bedrails?: None Help needed moving to and from a bed to a chair (including a wheelchair)?: A Little Help needed standing up from a chair using your arms (e.g., wheelchair or bedside chair)?: A Little Help needed to walk in hospital room?: A Little Help needed climbing 3-5 steps with a railing? : A Little 6 Click Score: 20    End of Session   Activity Tolerance: Patient tolerated treatment well Patient left: in chair;with call bell/phone within reach;with family/visitor present Nurse Communication: Mobility status PT Visit Diagnosis: Unsteadiness on feet (R26.81);Other abnormalities of gait and mobility (R26.89);Difficulty in  walking, not elsewhere classified (R26.2)    Time: 1191-4782 PT Time Calculation (min) (ACUTE ONLY): 30 min   Charges:   PT Evaluation $PT Eval Moderate Complexity: 1 Mod PT Treatments $Gait Training: 8-22 mins PT General Charges $$ ACUTE PT VISIT: 1 Visit         10/17/2022  Jacinto Halim., PT Acute Rehabilitation Services [phone]  (office)  [name] Gum Arthur [name] 10/17/2022, 6:00 PM

## 2022-10-17 NOTE — ED Notes (Signed)
ED TO INPATIENT HANDOFF REPORT  ED Nurse Name and Phone #: Donny Pique, RN [phone]  S Name/Age/Gender Arthur [name] 82 y.o. male Room/Bed: H026C/H026C  Code Status   Code Status: Full Code  Home/SNF/Other Home Patient oriented to: self, place, time, and situation Is this baseline? Yes   Triage Complete: Triage complete  Chief Complaint GI bleed [K92.2] Symptomatic anemia [D64.9] Gastrointestinal hemorrhage, unspecified gastrointestinal hemorrhage type [K92.2]  Triage Note Patient received a call from his PCP this evening advised him to go to ER for blood transfusion due to low hemoglobin test result taken this morning at clinic . He adds generalized weakness/fatigue this week . Denies hematuria or hematochezia/melena.    Allergies Allergies  Allergen Reactions   Ace Inhibitors Cough   Iohexol     Severe ATN after CT with contrast May 2023 - creatinine peaked near 8. Did not require HD.    Level of Care/Admitting Diagnosis ED Disposition     ED Disposition  Admit   Condition  --   Comment  [facility] [[id number]]  Level of Care: Progressive [102]  Admit to Progressive based on following criteria: GI, ENDOCRINE disease patients with GI bleeding, acute liver failure or pancreatitis, stable with diabetic ketoacidosis or thyrotoxicosis (hypothyroid) state.  May admit patient to [facility] or [facility] if equivalent level of care is available:: No  Covid Evaluation: Asymptomatic - no recent exposure (last 10 days) testing not required  Diagnosis: GI bleed [960454]  Admitting Physician: Billey Co [[id number]]  Attending Physician: Billey Co [[id number]]  Certification:: I certify this patient will need inpatient services for at least 2 midnights  Expected Medical Readiness: 10/18/2022          B Medical/Surgery History Past Medical History:  Diagnosis Date   Diabetes mellitus without complication (HCC)    High cholesterol     Hypertension    lung ca    Normal nuclear stress test 01/29/2008   stress perfusion study apparently in 2010 in [facility] which he said was negative.   Past Surgical History:  Procedure Laterality Date   CATARACT EXTRACTION, BILATERAL  2006   CORONARY STENT INTERVENTION N/A 02/06/2021   Procedure: CORONARY STENT INTERVENTION;  Surgeon: Corky Crafts, MD;  Location: [facility] INVASIVE CV LAB;  Service: Cardiovascular;  Laterality: N/A;   CORONARY ULTRASOUND/IVUS N/A 02/06/2021   Procedure: Intravascular Ultrasound/IVUS;  Surgeon: Corky Crafts, MD;  Location: [facility] INVASIVE CV LAB;  Service: Cardiovascular;  Laterality: N/A;   FINE NEEDLE ASPIRATION  03/21/2021   Procedure: FINE NEEDLE ASPIRATION;  Surgeon: Josephine Igo, DO;  Location: [facility] ENDOSCOPY;  Service: Pulmonary;;   IR RADIOLOGIST EVAL & MGMT  12/24/2021   KNEE SURGERY  2005   LEFT HEART CATH AND CORONARY ANGIOGRAPHY N/A 02/06/2021   Procedure: LEFT HEART CATH AND CORONARY ANGIOGRAPHY;  Surgeon: Corky Crafts, MD;  Location: [facility] INVASIVE CV LAB;  Service: Cardiovascular;  Laterality: N/A;   VIDEO BRONCHOSCOPY WITH ENDOBRONCHIAL ULTRASOUND Bilateral 03/21/2021   Procedure: VIDEO BRONCHOSCOPY WITH ENDOBRONCHIAL ULTRASOUND;  Surgeon: Josephine Igo, DO;  Location: [facility] ENDOSCOPY;  Service: Pulmonary;  Laterality: Bilateral;     A IV Location/Drains/Wounds Patient Lines/Drains/Airways Status     Active Line/Drains/Airways     Name Placement date Placement time Site Days   Peripheral IV 10/16/22 20 G Anterior;Left Forearm 10/16/22  0132  Forearm  1            Intake/Output Last 24 hours  Intake/Output Summary (Last 24 hours) at 10/17/2022 1315 Last data filed at 10/17/2022 1119 Gross per 24 hour  Intake 920.67 ml  Output --  Net 920.67 ml    Labs/Imaging Results for orders placed or performed during the hospital encounter of 10/15/22 (from the past 48 hour(s))  Type and screen [facility]     Status: None (Preliminary result)   Collection Time: 10/15/22 11:27 PM  Result Value Ref Range   ABO/RH(D) B NEG    Antibody Screen NEG    Sample Expiration 10/18/2022,2359    Unit Number [id number]    Blood Component Type RED CELLS,LR    Unit division 00    Status of Unit ISSUED,FINAL    Transfusion Status OK TO TRANSFUSE    Crossmatch Result      Compatible Performed at [facility], [address] [place], [place] [address]    Unit Number [id number]    Blood Component Type RED CELLS,LR    Unit division 00    Status of Unit ISSUED,FINAL    Transfusion Status OK TO TRANSFUSE    Crossmatch Result Compatible    Unit Number [id number]    Blood Component Type RED CELLS,LR    Unit division 00    Status of Unit ISSUED    Transfusion Status OK TO TRANSFUSE    Crossmatch Result Compatible   CBC with Differential     Status: Abnormal   Collection Time: 10/15/22 11:27 PM  Result Value Ref Range   WBC 11.2 (H) 4.0 - 10.5 K/uL   RBC 1.64 (L) 4.22 - 5.81 MIL/uL   Hemoglobin 4.8 (LL) 13.0 - 17.0 g/dL    Comment: REPEATED TO VERIFY THIS CRITICAL RESULT HAS VERIFIED AND BEEN CALLED TO B SANGALANG RN BY JALEESA WHITE ON 09 18 2024 AT 0007, AND HAS BEEN READ BACK.     HCT 16.5 (L) 39.0 - 52.0 %   MCV 100.6 (H) 80.0 - 100.0 fL   MCH 29.3 26.0 - 34.0 pg   MCHC 29.1 (L) 30.0 - 36.0 g/dL   RDW 32.4 (H) 40.1 - 02.7 %   Platelets 233 150 - 400 K/uL   nRBC 5.9 (H) 0.0 - 0.2 %   Neutrophils Relative % 65 %   Neutro Abs 7.3 1.7 - 7.7 K/uL   Lymphocytes Relative 21 %   Lymphs Abs 2.4 0.7 - 4.0 K/uL   Monocytes Relative 9 %   Monocytes Absolute 1.0 0.1 - 1.0 K/uL   Eosinophils Relative 2 %   Eosinophils Absolute 0.2 0.0 - 0.5 K/uL   Basophils Relative 0 %   Basophils Absolute 0.1 0.0 - 0.1 K/uL   Immature Granulocytes 3 %   Abs Immature Granulocytes 0.29 (H) 0.00 - 0.07 K/uL    Comment: Performed at [facility], [address], [place], [place] [address]   Comprehensive metabolic panel     Status: Abnormal   Collection Time: 10/15/22 11:27 PM  Result Value Ref Range   Sodium 137 135 - 145 mmol/L   Potassium 3.8 3.5 - 5.1 mmol/L   Chloride 104 98 - 111 mmol/L   CO2 23 22 - 32 mmol/L   Glucose, Bld 146 (H) 70 - 99 mg/dL    Comment: Glucose reference range applies only to samples taken after fasting for at least 8 hours.   BUN 29 (H) 8 - 23 mg/dL   Creatinine, Ser 4.40 (H) 0.61 - 1.24 mg/dL   Calcium 8.1 (  L) 8.9 - 10.3 mg/dL   Total Protein 5.3 (L) 6.5 - 8.1 g/dL   Albumin 2.5 (L) 3.5 - 5.0 g/dL   AST 32 15 - 41 U/L   ALT 21 0 - 44 U/L   Alkaline Phosphatase 60 38 - 126 U/L   Total Bilirubin 1.3 (H) 0.3 - 1.2 mg/dL   GFR, Estimated 51 (L) >60 mL/min    Comment: (NOTE) Calculated using the CKD-EPI Creatinine Equation (2021)    Anion gap 10 5 - 15    Comment: Performed at [facility], [address] [place], [place] [address]  Protime-INR     Status: Abnormal   Collection Time: 10/15/22 11:27 PM  Result Value Ref Range   Prothrombin Time 17.0 (H) 11.4 - 15.2 seconds   INR 1.4 (H) 0.8 - 1.2    Comment: (NOTE) INR goal varies based on device and disease states. Performed at [facility], [address], [place], [place] [address]   POC occult blood, ED     Status: Abnormal   Collection Time: 10/16/22 12:41 AM  Result Value Ref Range   Fecal Occult Bld POSITIVE (A) NEGATIVE  Prepare RBC (crossmatch)     Status: None   Collection Time: 10/16/22 12:45 AM  Result Value Ref Range   Order Confirmation      ORDER PROCESSED BY BLOOD BANK Performed at [facility], [address], [place], [place] [address]   CBC     Status: Abnormal   Collection Time: 10/16/22  9:10 AM  Result Value Ref Range   WBC 9.4 4.0 - 10.5 K/uL   RBC 2.67 (L) 4.22 - 5.81 MIL/uL   Hemoglobin 8.3 (L) 13.0 - 17.0 g/dL    Comment: REPEATED TO VERIFY POST TRANSFUSION SPECIMEN    HCT 25.7 (L) 39.0 - 52.0 %   MCV 96.3 80.0 - 100.0 fL   MCH  31.1 26.0 - 34.0 pg   MCHC 32.3 30.0 - 36.0 g/dL   RDW 30.1 (H) 60.1 - 09.3 %   Platelets 217 150 - 400 K/uL    Comment: CONSISTENT WITH PREVIOUS RESULT REPEATED TO VERIFY    nRBC 5.0 (H) 0.0 - 0.2 %    Comment: Performed at [facility], [address] [place], [place] [address]  Hemoglobin and hematocrit, blood     Status: Abnormal   Collection Time: 10/16/22  8:45 PM  Result Value Ref Range   Hemoglobin 8.6 (L) 13.0 - 17.0 g/dL   HCT 32.2 (L) 02.5 - 42.7 %    Comment: Performed at [facility], [address], [place], [place] [address]  CBC     Status: Abnormal   Collection Time: 10/17/22  4:40 AM  Result Value Ref Range   WBC 7.5 4.0 - 10.5 K/uL   RBC 2.47 (L) 4.22 - 5.81 MIL/uL   Hemoglobin 7.5 (L) 13.0 - 17.0 g/dL   HCT 62.8 (L) 31.5 - 17.6 %   MCV 94.7 80.0 - 100.0 fL   MCH 30.4 26.0 - 34.0 pg   MCHC 32.1 30.0 - 36.0 g/dL   RDW 16.0 (H) 73.7 - 10.6 %   Platelets 216 150 - 400 K/uL   nRBC 4.1 (H) 0.0 - 0.2 %    Comment: Performed at [facility], [address], [place], [place] [address]  Prepare RBC (crossmatch)     Status: None   Collection Time: 10/17/22  5:20 AM  Result Value Ref Range   Order Confirmation      ORDER PROCESSED BY BLOOD BANK Performed at [facility], [address] [place], [place] [address]   Hemoglobin and hematocrit, blood     Status: Abnormal   Collection Time: 10/17/22  9:57 AM  Result Value Ref Range   Hemoglobin 9.2 (L) 13.0 - 17.0 g/dL   HCT 62.1 (L) 30.8 - 65.7 %    Comment: Performed at [facility], [address], [place], [place] [address]   No results found.  Pending Labs Unresulted Labs (From admission, onward)     Start     Ordered   10/18/22 0500  CBC  Tomorrow morning,   R        10/17/22 0902   10/18/22 0500  Basic metabolic panel  Tomorrow morning,   R        10/17/22 0902            Vitals/Pain Today's Vitals   10/17/22 1139 10/17/22 1140 10/17/22 1150 10/17/22 1229  BP:  (!) 111/52 (!) 103/46 (!) 110/54   Pulse: 65 72 75   Resp: 13 12 18    Temp:    98.3 F (36.8 C)  TempSrc:    Oral  SpO2: 97% 97% 95%   PainSc:   0-No pain     Isolation Precautions No active isolations  Medications Medications  alectinib (ALECENSA) capsule 600 mg ( Oral MAR Unhold 10/17/22 1219)  furosemide (LASIX) tablet 20 mg ( Oral MAR Unhold 10/17/22 1219)  rosuvastatin (CRESTOR) tablet 5 mg ( Oral MAR Unhold 10/17/22 1219)  Fe Fum-Vit C-Vit B12-FA (TRIGELS-F FORTE) capsule 1 capsule ( Oral MAR Unhold 10/17/22 1219)  linaclotide (LINZESS) capsule 72 mcg ( Oral MAR Unhold 10/17/22 1219)  pantoprazole (PROTONIX) injection 40 mg ( Intravenous MAR Unhold 10/17/22 1219)  0.9 %  sodium chloride infusion (Manually program via Guardrails IV Fluids) (0 mLs Intravenous Stopped 10/16/22 0913)  pantoprazole (PROTONIX) injection 40 mg (40 mg Intravenous Given 10/16/22 0134)  peg 3350 powder (MOVIPREP) kit 100 g (100 g Oral Given 10/16/22 2033)  0.9 %  sodium chloride infusion (Manually program via Guardrails IV Fluids) (0 mLs Intravenous Stopped 10/17/22 2952)    Mobility walks with device   Walker in the room, wheelchair for long distances  Focused Assessments    R Recommendations: See Admitting Provider Note  Report given to:   Additional Notes: Patient is A&Ox4, walks with a walker and uses a wheelchair for long distances due to foot drop. Went to Endo and returned with no complications.

## 2022-10-17 NOTE — ED Notes (Signed)
Report called to endo. Ok for AM meds with small sip water.

## 2022-10-18 DIAGNOSIS — K264 Chronic or unspecified duodenal ulcer with hemorrhage: Secondary | ICD-10-CM | POA: Diagnosis not present

## 2022-10-18 DIAGNOSIS — Z86711 Personal history of pulmonary embolism: Secondary | ICD-10-CM | POA: Diagnosis not present

## 2022-10-18 DIAGNOSIS — D649 Anemia, unspecified: Secondary | ICD-10-CM

## 2022-10-18 DIAGNOSIS — Z955 Presence of coronary angioplasty implant and graft: Secondary | ICD-10-CM | POA: Diagnosis not present

## 2022-10-18 DIAGNOSIS — I480 Paroxysmal atrial fibrillation: Secondary | ICD-10-CM | POA: Diagnosis not present

## 2022-10-18 LAB — TYPE AND SCREEN
ABO/RH(D): B NEG
Antibody Screen: NEGATIVE
Unit division: 0
Unit division: 0
Unit division: 0

## 2022-10-18 LAB — CBC
HCT: 25.6 % — ABNORMAL LOW (ref 39.0–52.0)
Hemoglobin: 8.3 g/dL — ABNORMAL LOW (ref 13.0–17.0)
MCH: 29.9 pg (ref 26.0–34.0)
MCHC: 32.4 g/dL (ref 30.0–36.0)
MCV: 92.1 fL (ref 80.0–100.0)
Platelets: 218 10*3/uL (ref 150–400)
RBC: 2.78 MIL/uL — ABNORMAL LOW (ref 4.22–5.81)
RDW: 19 % — ABNORMAL HIGH (ref 11.5–15.5)
WBC: 6 10*3/uL (ref 4.0–10.5)
nRBC: 3.7 % — ABNORMAL HIGH (ref 0.0–0.2)

## 2022-10-18 LAB — BASIC METABOLIC PANEL
Anion gap: 6 (ref 5–15)
BUN: 20 mg/dL (ref 8–23)
CO2: 24 mmol/L (ref 22–32)
Calcium: 7.5 mg/dL — ABNORMAL LOW (ref 8.9–10.3)
Chloride: 107 mmol/L (ref 98–111)
Creatinine, Ser: 1.41 mg/dL — ABNORMAL HIGH (ref 0.61–1.24)
GFR, Estimated: 50 mL/min — ABNORMAL LOW (ref >60)
Glucose, Bld: 94 mg/dL (ref 70–99)
Potassium: 4.1 mmol/L (ref 3.5–5.1)
Sodium: 137 mmol/L (ref 135–145)

## 2022-10-18 LAB — BPAM RBC
Blood Product Expiration Date: 202410082359
Blood Product Expiration Date: 202410102359
Blood Product Expiration Date: 202410122359
ISSUE DATE / TIME: 202409180145
ISSUE DATE / TIME: 202409180543
ISSUE DATE / TIME: 202409190558
Unit Type and Rh: 1700
Unit Type and Rh: 1700
Unit Type and Rh: 1700

## 2022-10-18 LAB — HEPATIC FUNCTION PANEL
ALT: 22 U/L (ref 0–44)
AST: 29 U/L (ref 15–41)
Albumin: 2.2 g/dL — ABNORMAL LOW (ref 3.5–5.0)
Alkaline Phosphatase: 65 U/L (ref 38–126)
Bilirubin, Direct: 0.5 mg/dL — ABNORMAL HIGH (ref 0.0–0.2)
Indirect Bilirubin: 1.6 mg/dL — ABNORMAL HIGH (ref 0.3–0.9)
Total Bilirubin: 2.1 mg/dL — ABNORMAL HIGH (ref 0.3–1.2)
Total Protein: 4.8 g/dL — ABNORMAL LOW (ref 6.5–8.1)

## 2022-10-18 LAB — HEMOGLOBIN AND HEMATOCRIT, BLOOD
HCT: 28.3 % — ABNORMAL LOW (ref 39.0–52.0)
Hemoglobin: 9.1 g/dL — ABNORMAL LOW (ref 13.0–17.0)

## 2022-10-18 MED ORDER — CLOPIDOGREL BISULFATE 75 MG PO TABS
75.0000 mg | ORAL_TABLET | Freq: Every day | ORAL | Status: DC
  Administered 2022-10-18 – 2022-10-20 (×3): 75 mg via ORAL
  Filled 2022-10-18 (×3): qty 1

## 2022-10-18 MED ORDER — ENOXAPARIN SODIUM 80 MG/0.8ML IJ SOSY
1.0000 mg/kg | PREFILLED_SYRINGE | Freq: Two times a day (BID) | INTRAMUSCULAR | Status: DC
  Administered 2022-10-18: 72.5 mg via SUBCUTANEOUS
  Filled 2022-10-18 (×2): qty 0.72

## 2022-10-18 NOTE — TOC Initial Note (Addendum)
Transition of Care (TOC) - Initial/Assessment Note   Spoke to patient and son at bedside. Patient from home with son.   Discussed PT recommendations for HHPT and walker. Both in agreement. Patient has had HH in past and has no preference in agency.   Kelly with [facility] accepted referral.   Patient has received walker two years ago but would like a new one . NCM explained insurance only covers a walker every five years . Patient does not think walker was through insurance. Walker ordered with Mitch with [employer]. [employer] will discuss coverage directly with patient and son. Both voiced understanding   Patient received a rollator through insurance last year, Insurance will not cover a walker. [employer] will discuss with patient   Asked attending team for HHPT orders and F2F Patient Details  Name: Arthur [name] MRN: [id number] Date of Birth: 1940-03-24  Transition of Care [facility]) CM/SW Contact:    Kingsley Plan, RN Phone Number: 10/18/2022, 12:02 PM  Clinical Narrative:                   Expected Discharge Plan: Home w Home Health Services Barriers to Discharge: Continued Medical Work up   Patient Goals and CMS Choice Patient states their goals for this hospitalization and ongoing recovery are:: to return to home CMS Medicare.gov Compare Post Acute Care list provided to:: Patient Choice offered to / list presented to : Patient [place] ownership interest in [facility].provided to:: Patient    Expected Discharge Plan and Services   Discharge Planning Services: CM Consult Post Acute Care Choice: Durable Medical Equipment, Home Health Living arrangements for the past 2 months: Single Family Home                 DME Arranged: Walker rolling DME Agency: [employer] Date DME Agency Contacted: 10/18/22 Time DME Agency Contacted: 1201 Representative spoke with at DME Agency: Mitch HH Arranged: PT HH Agency: [facility] Date [facility] Agency Contacted:  10/18/22 Time HH Agency Contacted: 1201 Representative spoke with at [facility] Agency: Tresa Endo  Prior Living Arrangements/Services Living arrangements for the past 2 months: Single Family Home Lives with:: Adult Children Patient language and need for interpreter reviewed:: Yes Do you feel safe going back to the place where you live?: Yes      Need for Family Participation in Patient Care: Yes (Comment) Care giver support system in place?: Yes (comment) Current home services: DME Criminal Activity/Legal Involvement Pertinent to Current Situation/Hospitalization: No - Comment as needed  Activities of Daily Living Home Assistive Devices/Equipment: Environmental consultant (specify type), Wheelchair ADL Screening (condition at time of admission) [name] cognitive ability adequate to safely complete daily activities?: Yes Is the patient deaf or have difficulty hearing?: No Does the patient have difficulty seeing, even when wearing glasses/contacts?: No Does the patient have difficulty concentrating, remembering, or making decisions?: No Patient able to express need for assistance with ADLs?: Yes Does the patient have difficulty dressing or bathing?: No Independently performs ADLs?: Yes (appropriate for developmental age) Does the patient have difficulty walking or climbing stairs?: Yes Weakness of Legs: Both  Permission Sought/Granted   Permission granted to share information with : Yes, Verbal Permission Granted  Share Information with NAME: son Vipul  Permission granted to share info w AGENCY: DME agencies and home helath agencies        Emotional Assessment Appearance:: Appears stated age Attitude/Demeanor/Rapport: Engaged Affect (typically observed): Accepting Orientation: : Oriented to Self, Oriented to Place, Oriented to  Time, Oriented to Situation Alcohol / Substance Use: Not Applicable Psych Involvement: No (comment)  Admission diagnosis:  GI bleed [K92.2] Symptomatic anemia  [D64.9] Gastrointestinal hemorrhage, unspecified gastrointestinal hemorrhage type [K92.2] Patient Active Problem List   Diagnosis Date Noted   Duodenal ulcer 10/17/2022   Status post primary angioplasty with coronary stent 10/17/2022   GI bleed 10/16/2022   Gassiness 08/14/2022   Constipation 08/07/2022   Atypical pneumonia 11/07/2021   Renal insufficiency 07/04/2021   Foot drop, left foot 07/04/2021   Non-small cell carcinoma of lung, stage 4 (HCC) 06/15/2021   Encounter for antineoplastic chemotherapy 06/13/2021   Deep venous embolism and thrombosis (HCC) 05/16/2021   Inguinal hernia 05/16/2021   Adenocarcinoma of right lung, stage 4 (HCC) 04/09/2021   Cerebral embolism with cerebral infarction 03/18/2021   Status post coronary artery stent placement    Coronary artery disease due to lipid rich plaque    Pulmonary embolus (HCC) 02/03/2021   Symptomatic anemia 09/06/2015   History of pulmonary embolism    GERD (gastroesophageal reflux disease) 07/05/2013   Cough 03/19/2011   Arthritis 02/26/2010   Gout 11/06/2009   Diabetes mellitus, type II (HCC) 09/22/2009   Hyperlipidemia 09/22/2009   Essential hypertension, benign 09/22/2009   PCP:  Carney Living, MD Pharmacy:   [facility] [address] [place] - [address] [place] [address] Phone: [phone] Fax: [phone]  [facility] ORDER) ELECTRONIC - [place], [place] - [address] [place] [address] Phone: [phone] Fax: [phone]  [facility] [address] [place] [address] Phone: [phone] Fax: [phone]  [facility] [address] [place] [address] Phone: [phone] Fax: [phone]  [facility] [address] [place] - [address] [place] [address] Phone: [phone] Fax:  [phone]     Social Determinants of Health (SDOH) Social History: SDOH Screenings   Food Insecurity: No Food Insecurity (08/09/2022)  Housing: Low Risk  (08/09/2022)  Transportation Needs: Unmet Transportation Needs (08/09/2022)  Utilities: Not At Risk (08/09/2022)  Depression (PHQ2-9): Low Risk  (10/03/2022)  Tobacco Use: Low Risk  (10/17/2022)   SDOH Interventions:     Readmission Risk Interventions    06/19/2021    4:57 PM  Readmission Risk Prevention Plan  Transportation Screening Complete  PCP or Specialist Appt within 3-5 Days Complete  HRI or Home Care Consult Complete  Social Work Consult for Recovery Care Planning/Counseling Complete  Medication Review Oceanographer) Complete

## 2022-10-18 NOTE — Assessment & Plan Note (Addendum)
-   Had atrial fibrillation on monitoring overnight.  He has an elevated CHADS2 VASCand yet another indication for anticoagulation.  Will discuss with hematology as above.

## 2022-10-18 NOTE — Assessment & Plan Note (Deleted)
Rosuvastatin 5 mg every day

## 2022-10-18 NOTE — Assessment & Plan Note (Addendum)
--   Pt has IVC filter in place -- Pt on Eliquis 2.5 mg BID as outpatient  --Hematology consulted for further recommendations regarding anticoagulation given his complex history

## 2022-10-18 NOTE — Assessment & Plan Note (Addendum)
Has underlying artery disease and is been status post LAD stent in 2023. Restarting Plavix today  P.m. CBC Continue rosuvastatin

## 2022-10-18 NOTE — Assessment & Plan Note (Signed)
Pt found to have a non-bleeding ulcer.  - Per GI Op note: Continue PO PPI BID for 8 weeks, then once daily indefinitely.

## 2022-10-18 NOTE — Progress Notes (Signed)
   Rounding Note    Patient Name: Arthur [name] Date of Encounter: 10/18/2022  [facility] Cardiologist: Jodelle Red, MD   Subjective   Restarted clopidogrel today. No gross bleeding noted.  Inpatient Medications    Scheduled Meds:  alectinib  600 mg Oral BID WC   clopidogrel  75 mg Oral Daily   Fe Fum-Vit C-Vit B12-FA  1 capsule Oral Daily   pantoprazole  40 mg Oral BID   rosuvastatin  5 mg Oral Daily   Continuous Infusions:  PRN Meds: furosemide, linaclotide   Vital Signs    Vitals:   10/17/22 1933 10/17/22 2334 10/18/22 0421 10/18/22 0733  BP: (!) 113/49 107/67 112/68 (!) 112/58  Pulse: 72 71 81 71  Resp: 16 19 19 19   Temp: 98.5 F (36.9 C) 98 F (36.7 C) 98.5 F (36.9 C) 98.6 F (37 C)  TempSrc: Oral Oral Oral Oral  SpO2: 92% 97% 93% 94%    Intake/Output Summary (Last 24 hours) at 10/18/2022 1448 Last data filed at 10/18/2022 1206 Gross per 24 hour  Intake --  Output 525 ml  Net -525 ml      10/08/2022   10:12 AM 10/03/2022    2:21 PM 09/06/2022   11:07 AM  Last 3 Weights  Weight (lbs) 157 lb 3.2 oz -- --  Weight (kg) 71.305 kg -- --      Physical Exam   GEN: Well nourished, well developed in no acute distress HEENT: Normal, moist mucous membranes NECK: No JVD CARDIAC: irregularly irregular rhythm, normal S1 and S2, no rubs or gallops. No murmur. VASCULAR: Radial and DP pulses 2+ bilaterally. No carotid bruits RESPIRATORY:  Clear to auscultation without rales, wheezing or rhonchi  ABDOMEN: Soft, non-tender, non-distended MUSCULOSKELETAL:  moves all 4 limbs independently SKIN: Warm and dry, no edema NEUROLOGIC:  Alert and oriented x 3. No focal neuro deficits noted. PSYCHIATRIC:  Normal affect    New pertinent results (labs, ECG, imaging, cardiac studies)     Patient Profile     82 y.o. male whom I know well from his hospitalization in 2023, at which time he was found to have multiple pulmonary emboli, and he also  suffered a cardiac arrest with preceding VT during that admission, which resulted in heart cath showing complex proximal LAD disease requiring stent. We are asked today to weight in on clopidogrel use with his chronic anemia of unclear etiology.He has comorbid history including CVA, atrial fibrillation, and stage 4 non small cell lung cancer.  Assessment & Plan    See extensive discussion from 10/17/22 note. From cardiac standpoint, would continue clopidogrel. He is being evaluated by heme/onc re: anticoagulation, as there is concern he is apixaban failure given that he had CVA while on full dose apixaban.   Very complex situation. No active bleeding seen, but cratered ulcer on EGD. Best case scenario is that he has stable Hgb with treatment of his ulcer. However, if he does have continued anemia, the risk/benefit of both antiplatelet and anticoagulation would need to be discussed. I would consider the anticoagulant to be the higher risk of bleeding, but he has significant thrombotic risk, including recurrent CVA given his afib.  Family understands complex situation. We will arrange for outpatient cardiology follow up with me to monitor his clinical status.     Signed, Jodelle Red, MD  10/18/2022, 2:48 PM

## 2022-10-18 NOTE — Assessment & Plan Note (Signed)
--  Alectinib 150mg  4 capsules BID, follows with [facility]

## 2022-10-18 NOTE — Plan of Care (Signed)

## 2022-10-18 NOTE — Assessment & Plan Note (Signed)
Diet  Controlled.

## 2022-10-18 NOTE — Assessment & Plan Note (Addendum)
Had a EGD on September 19 which showed a nonbleeding duodenal ulcer.  GI has signed off.  We appreciate their consult and care. - Per GI Op note: Continue PO PPI BID for 8 weeks (end 10/14) , then once daily indefinitely -- Biopsies collected.

## 2022-10-18 NOTE — Progress Notes (Addendum)
FMTS Attending Daily Note: Arthur Starr, MD  Team Pager [phone] Pager [phone]  I have personally seen and examined this patient. I have reviewed their chart. I have discussed this patient with the resident physician.  Addendums to below note include:  GI bleed with plavix and eliquis contributing.  Edits within note.  Pending how he does tomorrow and throughout the weekend potential discharge home this weekend.  I agree with the remainder of the findings, exam, and plan below by the resident physician.    Disposition: Possibly home 9/21 or 9/22 pending a stable hemoglobin and resumption of clopidogrel and discussion of Eliquis with hematology     Daily Progress Note Intern Pager: [phone]  Patient name: Arthur [name] Medical record number: [id number] Date of birth: 02-03-1940 Age: 82 y.o. Gender: male  Primary Care Provider: Carney Living, MD Consultants: GI, Cardiology Code Status: Full  Pt Overview and Major Events to Date:  Arthur [name] is a 82 y.o. male presenting with symptomatic anemia and positive hemoccult test. 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   Assessment and Plan: Assessment & Plan Symptomatic anemia due to GI blood loss in the setting of duodenal ulcer, acute on chronic anemia Patient comes in with weeklong symptoms of weakness and fatigue, had Hgb 4.8 on admission. Patient was also Hemoccult positive on admission. Patient has history of anemia, multifactorial in nature (IDA, hemolytic, chronic disease).  Patient is followed by hematology oncology, last seen in August.  Patient is hemodynamically stable. Has received 3 units during this hospitalization so far. - Transfused 1 units PRBC (9/19) - 9/20 CBC came back with Hgb 8.3 -- P.m. hemoglobin -Continue oral iron --Indirect bilirubin still elevated, Chane smear, LDH and haptoglobin to look for degree of hemolysis.  He does have a history of hemolytic anemia in  the past. GI bleed Had a EGD on September 19 which showed a nonbleeding duodenal ulcer.  GI has signed off.  We appreciate their consult and care. - Per GI Op note: Continue PO PPI BID for 8 weeks (end 10/14) , then once daily indefinitely -- Biopsies collected. Adenocarcinoma of right lung, stage 4 (HCC) -- Alectinib 150mg  4 capsules BID, follows with [facility] Diabetes mellitus, type II (HCC) -- Diet Controlled History of pulmonary embolism -- Pt has IVC filter in place -- Pt on Eliquis 2.5 mg BID as outpatient  --Hematology consulted for further recommendations regarding anticoagulation given his complex history Duodenal ulcer Pt found to have a non-bleeding ulcer.  - Per GI Op note: Continue PO PPI BID for 8 weeks, then once daily indefinitely. Status post primary angioplasty with coronary stent Has underlying artery disease and is been status post LAD stent in 2023. Restarting Plavix today  P.m. CBC Continue rosuvastatin Paroxysmal atrial fibrillation (HCC) - Had atrial fibrillation on monitoring overnight.  He has an elevated CHADS2 VASCand yet another indication for anticoagulation.  Will discuss with hematology as above.     FEN/GI: Regular (vegetarian) diet VTE Prophylaxis: SCD's  Subjective:  Pt is with his daughter laying down in a hospital bed in the emergency department prior to his endoscopic procedure but after his 3rd transfusion. Exam conducted with PharmD Georgina Snell present. Pt is pleasant and alert. He is a a fair historian. He reports no acute events overnight, though he is frustrated that he was NPO much of yesterday and then again today. Apologized to patient. Patient reports no symptoms of  dizziness, SOB, chest pain. No problems with N/V/D/C.   Objective: Temp:  [98 F (36.7 C)-98.6 F (37 C)] 98.6 F (37 C) (09/20 0733) Pulse Rate:  [71-81] 71 (09/20 0733) Resp:  [16-19] 19 (09/20 0733) BP: (107-113)/(49-68) 112/58 (09/20 0733) SpO2:  [92 %-97  %] 94 % (09/20 0733)  Physical Exam: General: Alert, conversant on exam. Oriented x4. Looks chronically ill.  Cardiovascular: Slightly irregular rhythm RR, no murmurs rubs or gallops appreciated Respiratory: Pt not in current distress. Lung fields CTAB. Abdomen: Pt NT/ND. No masses palpated. Extremities: No cyanosis, clubbing, or edema.  Laboratory: Most recent CBC Lab Results  Component Value Date   WBC 6.0 10/18/2022   HGB 8.3 (L) 10/18/2022   HCT 25.6 (L) 10/18/2022   MCV 92.1 10/18/2022   PLT 218 10/18/2022   Most recent BMP    Latest Ref Rng & Units 10/18/2022    3:01 AM  BMP  Glucose 70 - 99 mg/dL 94   BUN 8 - 23 mg/dL 20   Creatinine 7.82 - 1.24 mg/dL 9.56   Sodium 213 - 086 mmol/L 137   Potassium 3.5 - 5.1 mmol/L 4.1   Chloride 98 - 111 mmol/L 107   CO2 22 - 32 mmol/L 24   Calcium 8.9 - 10.3 mg/dL 7.5     Imaging/Diagnostic Tests: No interval exams.   Westley Chandler, MD 10/18/2022, 3:57 PM  PGY-1, [facility] Family Medicine FPTS Intern pager: [phone], text pages welcome Secure chat group [facility] Teaching Service

## 2022-10-18 NOTE — Assessment & Plan Note (Addendum)
Patient comes in with weeklong symptoms of weakness and fatigue, had Hgb 4.8 on admission. Patient was also Hemoccult positive on admission. Patient has history of anemia, multifactorial in nature (IDA, hemolytic, chronic disease).  Patient is followed by hematology oncology, last seen in August.  Patient is hemodynamically stable. Has received 3 units during this hospitalization so far. - Transfused 1 units PRBC (9/19) - 9/20 CBC came back with Hgb 8.3 -- P.m. hemoglobin -Continue oral iron --Indirect bilirubin still elevated, Chane smear, LDH and haptoglobin to look for degree of hemolysis.  He does have a history of hemolytic anemia in the past.

## 2022-10-18 NOTE — Progress Notes (Signed)
Physical Therapy Treatment Patient Details Name: Arthur [name] MRN: [id number] DOB: 01-Jun-1940 Today's Date: 10/18/2022   History of Present Illness 82 y.o. male presenting with symptomatic anemia and positive hemoccult test.  PMH: Stage IV lung CA, prior PE, DVT, L drop foot.    PT Comments  Progressing well.  Still will benefit from HHPT for deconditioning and general weakness.  Emphasis on transitions, standing exercise and progression of gait stability/stamina/quality in the RW.     If plan is discharge home, recommend the following: A little help with bathing/dressing/bathroom;Assistance with cooking/housework;Assist for transportation;Help with stairs or ramp for entrance   Can travel by private vehicle        Equipment Recommendations  Rolling walker (2 wheels)    Recommendations for Other Services       Precautions / Restrictions Precautions Precautions: Fall     Mobility  Bed Mobility Overal bed mobility: Needs Assistance Bed Mobility: Supine to Sit     Supine to sit: Modified independent (Device/Increase time)          Transfers Overall transfer level: Needs assistance   Transfers: Sit to/from Stand Sit to Stand: Contact guard assist           General transfer comment: needs cues for prep/safety, but no hands on assist.    Ambulation/Gait Ambulation/Gait assistance: Contact guard assist Gait Distance (Feet): 150 Feet Assistive device: Rolling walker (2 wheels) Gait Pattern/deviations: Step-through pattern   Gait velocity interpretation: <1.31 ft/sec, indicative of household ambulator   General Gait Details: generally steady, pt didn't have to step high on the left, but unable to do heel toe pattern and would have benefited from his AFO.  Generally slow with good use of the RW.   Stairs             Wheelchair Mobility     Tilt Bed    Modified Rankin (Stroke Patients Only)       Balance                                             Cognition Arousal: Alert Behavior During Therapy: WFL for tasks assessed/performed Overall Cognitive Status: Within Functional Limits for tasks assessed (at baseline per family)                                          Exercises General Exercises - Lower Extremity Hip ABduction/ADduction: AROM, AAROM, Both, 10 reps, Standing Hip Flexion/Marching: AROM, Strengthening, Both, 10 reps, Standing Toe Raises: AROM, 10 reps, Both (df very weak on L foot) Heel Raises: AROM, Both, 10 reps, Standing Mini-Sqauts: AROM, Strengthening, 10 reps, Standing    General Comments General comments (skin integrity, edema, etc.): vss when PLETH is not noisy.  With Hgb in the low 8's 91/92 % is good on RA.      Pertinent Vitals/Pain Pain Assessment Pain Assessment: No/denies pain    Home Living                          Prior Function            PT Goals (current goals can now be found in the care plan section) Acute Rehab PT Goals PT Goal Formulation: With patient Time For  Goal Achievement: 10/24/22 Potential to Achieve Goals: Good Progress towards PT goals: Progressing toward goals    Frequency    Min 1X/week      PT Plan      Co-evaluation              AM-PAC PT "6 Clicks" Mobility   Outcome Measure  Help needed turning from your back to your side while in a flat bed without using bedrails?: None Help needed moving from lying on your back to sitting on the side of a flat bed without using bedrails?: None Help needed moving to and from a bed to a chair (including a wheelchair)?: A Little Help needed standing up from a chair using your arms (e.g., wheelchair or bedside chair)?: A Little Help needed to walk in hospital room?: A Little Help needed climbing 3-5 steps with a railing? : A Little 6 Click Score: 20    End of Session   Activity Tolerance: Patient tolerated treatment well Patient left: in bed;with call  bell/phone within reach Nurse Communication: Mobility status PT Visit Diagnosis: Other abnormalities of gait and mobility (R26.89);Unsteadiness on feet (R26.81)     Time: 1610-9604 PT Time Calculation (min) (ACUTE ONLY): 25 min  Charges:    $Gait Training: 8-22 mins $Therapeutic Exercise: 8-22 mins PT General Charges $$ ACUTE PT VISIT: 1 Visit                     10/18/2022  Jacinto Halim., PT Acute Rehabilitation Services [phone]  (office)   [name] 10/18/2022, 11:50 AM

## 2022-10-18 NOTE — Progress Notes (Signed)
Hematology/Oncology Consult Note  Clinical Summary: [name] is an 82 year old male with medical history significant for metastatic lung cancer who presents with GI bleed in the setting of anticoagulation therapy for DVT.  Reason for Consult: Anticoagulation recommendation in setting of GI bleed and prior DVT  HPI: [name] is an 82 year old male is a 82 year old male with medical history significant for stage IV non-small cell adenocarcinoma of the lung who is currently on alectinib oral therapy.  He follows with Dr. Shirline [name].  The patient was found to have acute DVT on 05/19/2015.  He said subsequent Doppler ultrasounds with the most recent Doppler on 01/01/2022 showing no evidence of active VTE.  He was noted to have an age-indeterminate VTE on 09/25/2021.  Additionally he is had superficial thrombophlebitis noted on 06/20/2021.  His extensive anticoagulation history has been documented in a pharmacist note on 10/17/2022.  It was noted that he was initially diagnosed with a PE and DVT in 2017 and treated with Xarelto therapy.  This was eventually transition to aspirin and unfortunately underwent a PEA cardiac arrest in January 2023.  He was started on heparin and then transition to Eliquis at that time.  Unfortunately after being on Eliquis and Plavix he was diagnosed with acute ischemic stroke on 03/19/2021.  He was also noted to have bilateral lower extremity DVTs and this was considered Eliquis failure for which she was started on Lovenox.  He developed a hematoma in his thigh in March 2023 for which the Lovenox was discontinued.  He had an IVC filter placed.  In September 2023 he was restarted on low-dose Eliquis therapy 2.5 mg twice daily.  Of note his IVC filter is still in place as of 09/24/2022.  On review of the prior records Arthur [name] presented on 10/16/2022 with symptomatic anemia and positive Hemoccult stools.  On 10/15/2022 patient had labs drawn which showed a hemoglobin of  5.1, MCV 98, and platelets of 253 with white blood cell count of 12.2.  This subsequently dropped to 4.8 later in the day.  He received transfusion support with a hemoglobin increased to 8.3 on 10/16/2022.  Arthur [name] underwent upper GI endoscopy on 10/17/2022 which revealed a nonbleeding cratered duodenal ulcer with no stigmata of bleeding.  Biopsies were taken for evaluation.  GI noted that restarting anticoagulation therapy would be acceptable.  Today the patient's blood counts show white blood cell count 6.0, hemoglobin 8.3, MCV 92.1, and platelets of 218.  On exam today Arthur [name] is accompanied by his son, wife, and daughter by phone.  Reports that he has had dark stools but thinks it may be due to his iron pills.  He notes that they are not tarry or sticky.  He is had these chronically as a result of his iron supplementation.  He notes that he is not having any lightheadedness, dizziness, shortness of breath.  He reports feeling much better after receiving his blood transfusions.  He is having some swelling in his lower extremities but his Lasix has been held while he is in the hospital.  He is not having any issues with fevers, chills, sweats, nausea, vomiting or diarrhea.  Full 10 point ROS is otherwise negative.  At length we discussed the options moving forward including transition to Lovenox, no anticoagulation therapy, and Eliquis/Xarelto.  I expressed my concern that Eliquis/Xarelto not be optimal given his Eliquis failure.  Additionally I noted that no treatment would be risky given his proclivity for clotting.  Given this I do think the best option moving forward with Lovenox.  Additionally in the setting of an acute bleed Lovenox would be more easily reversible than the other anticoagulation agents.  Patient and his family voiced understanding but they had hesitation regarding pain and discomfort that he had with Lovenox previously.  They were agreeable to a trial of Lovenox at this time.  They also  voiced understanding the risks of recurrent bleed with restarting anticoagulation therapy.  O:  Vitals:   10/18/22 0733 10/18/22 1601  BP: (!) 112/58 130/62  Pulse: 71 82  Resp: 19 18  Temp: 98.6 F (37 C) 98.6 F (37 C)  SpO2: 94% 100%      Latest Ref Rng & Units 10/18/2022    3:01 AM 10/15/2022   11:27 PM 10/15/2022   12:29 PM  CMP  Glucose 70 - 99 mg/dL 94  119  147   BUN 8 - 23 mg/dL 20  29  34   Creatinine 0.61 - 1.24 mg/dL 8.29  5.62  1.30   Sodium 135 - 145 mmol/L 137  137  141   Potassium 3.5 - 5.1 mmol/L 4.1  3.8  3.8   Chloride 98 - 111 mmol/L 107  104  105   CO2 22 - 32 mmol/L 24  23  22    Calcium 8.9 - 10.3 mg/dL 7.5  8.1  8.7   Total Protein 6.5 - 8.1 g/dL 4.8  5.3    Total Bilirubin 0.3 - 1.2 mg/dL 2.1  1.3    Alkaline Phos 38 - 126 U/L 65  60    AST 15 - 41 U/L 29  32    ALT 0 - 44 U/L 22  21        Latest Ref Rng & Units 10/18/2022    4:19 PM 10/18/2022    3:01 AM 10/17/2022    9:57 AM  CBC  WBC 4.0 - 10.5 K/uL  6.0    Hemoglobin 13.0 - 17.0 g/dL 9.1  8.3  9.2   Hematocrit 39.0 - 52.0 % 28.3  25.6  28.0   Platelets 150 - 400 K/uL  218        GENERAL: well appearing elderly [place] male in NAD  SKIN: skin color, texture, turgor are normal, no rashes or significant lesions EYES: conjunctiva are pink and non-injected, sclera clear LUNGS: clear to auscultation and percussion with normal breathing effort HEART: regular rate & rhythm and no murmurs and +2 pitting lower extremity edema bilaterally.  Musculoskeletal: no cyanosis of digits and no clubbing  PSYCH: alert & oriented x 3, fluent speech NEURO: no focal motor/sensory deficits  Assessment/Plan:  # Anticoagulation Management in Setting of DVT and GI Bleed -- Upper GI endoscopy revealed a nonbleeding duodenal ulcer.  GI recommended restarting anticoagulation therapy. -- Pharmacy note on 10/17/2022 shows anticoagulation history.  There is concern that the stroke on 03/19/2021 occurred while patient was  on Eliquis therapy.  This was considered an Eliquis failure. -- Given the risks and benefits I would recommend instead transitioning to Lovenox therapy 1 mg/kg every 12 hours as anticoagulation of choice.  Xarelto is not recommended in the setting of Eliquis failure. -- In the event the patient were to have rebleed or further issues with full strength anticoagulation could consider surveillance alone.  Surveillance could be a viable option given the placement of his IVC filter as well as the fact that he does not have active VTE.  The concern would  be the patient does appear quite hypercoagulable based on his history. -- Would recommend keeping the patient in house for at least 24 hours after starting anticoagulation therapy to assure there is no drop in hemoglobin or signs or symptoms concerning for recurrent GI bleeding. -- Patient can resume follow-up with Dr. Arbutus Ped in the outpatient setting for continued management of his VTE and lung cancer.   Ulysees Barns, MD Department of Hematology/Oncology [facility] at [facility] Phone: [phone] Pager: [phone] Email: [email or web address]

## 2022-10-19 ENCOUNTER — Other Ambulatory Visit (HOSPITAL_COMMUNITY): Payer: Self-pay

## 2022-10-19 DIAGNOSIS — I480 Paroxysmal atrial fibrillation: Secondary | ICD-10-CM | POA: Diagnosis not present

## 2022-10-19 DIAGNOSIS — C3491 Malignant neoplasm of unspecified part of right bronchus or lung: Secondary | ICD-10-CM | POA: Diagnosis not present

## 2022-10-19 DIAGNOSIS — D649 Anemia, unspecified: Secondary | ICD-10-CM | POA: Diagnosis not present

## 2022-10-19 DIAGNOSIS — Z7901 Long term (current) use of anticoagulants: Secondary | ICD-10-CM

## 2022-10-19 DIAGNOSIS — K922 Gastrointestinal hemorrhage, unspecified: Secondary | ICD-10-CM

## 2022-10-19 LAB — CBC
HCT: 26.2 % — ABNORMAL LOW (ref 39.0–52.0)
HCT: 28.9 % — ABNORMAL LOW (ref 39.0–52.0)
Hemoglobin: 8.6 g/dL — ABNORMAL LOW (ref 13.0–17.0)
Hemoglobin: 9.4 g/dL — ABNORMAL LOW (ref 13.0–17.0)
MCH: 30.6 pg (ref 26.0–34.0)
MCH: 30.1 pg (ref 26.0–34.0)
MCHC: 32.8 g/dL (ref 30.0–36.0)
MCHC: 32.5 g/dL (ref 30.0–36.0)
MCV: 93.2 fL (ref 80.0–100.0)
MCV: 92.6 fL (ref 80.0–100.0)
Platelets: 231 10*3/uL (ref 150–400)
Platelets: 253 10*3/uL (ref 150–400)
RBC: 2.81 MIL/uL — ABNORMAL LOW (ref 4.22–5.81)
RBC: 3.12 MIL/uL — ABNORMAL LOW (ref 4.22–5.81)
RDW: 18.1 % — ABNORMAL HIGH (ref 11.5–15.5)
RDW: 18.1 % — ABNORMAL HIGH (ref 11.5–15.5)
WBC: 7.6 10*3/uL (ref 4.0–10.5)
WBC: 5.6 10*3/uL (ref 4.0–10.5)
nRBC: 1.6 % — ABNORMAL HIGH (ref 0.0–0.2)
nRBC: 1.4 % — ABNORMAL HIGH (ref 0.0–0.2)

## 2022-10-19 LAB — BASIC METABOLIC PANEL
Anion gap: 10 (ref 5–15)
BUN: 20 mg/dL (ref 8–23)
CO2: 24 mmol/L (ref 22–32)
Calcium: 8.1 mg/dL — ABNORMAL LOW (ref 8.9–10.3)
Chloride: 103 mmol/L (ref 98–111)
Creatinine, Ser: 1.47 mg/dL — ABNORMAL HIGH (ref 0.61–1.24)
GFR, Estimated: 47 mL/min — ABNORMAL LOW (ref >60)
Glucose, Bld: 119 mg/dL — ABNORMAL HIGH (ref 70–99)
Potassium: 4 mmol/L (ref 3.5–5.1)
Sodium: 137 mmol/L (ref 135–145)

## 2022-10-19 LAB — IRON AND TIBC
Iron: 49 ug/dL (ref 45–182)
Saturation Ratios: 19 % (ref 17.9–39.5)
TIBC: 259 ug/dL (ref 250–450)
UIBC: 210 ug/dL

## 2022-10-19 LAB — FERRITIN: Ferritin: 285 ng/mL (ref 24–336)

## 2022-10-19 LAB — LACTATE DEHYDROGENASE: LDH: 164 U/L (ref 98–192)

## 2022-10-19 LAB — GLUCOSE, CAPILLARY: Glucose-Capillary: 130 mg/dL — ABNORMAL HIGH (ref 70–99)

## 2022-10-19 MED ORDER — ENOXAPARIN SODIUM 80 MG/0.8ML IJ SOSY
70.0000 mg | PREFILLED_SYRINGE | Freq: Two times a day (BID) | INTRAMUSCULAR | 3 refills | Status: DC
Start: 2022-10-19 — End: 2022-12-24
  Filled 2022-10-19 – 2022-11-16 (×2): qty 48, 30d supply, fill #0
  Filled 2022-12-14: qty 28, 20d supply, fill #1

## 2022-10-19 MED ORDER — PANTOPRAZOLE SODIUM 40 MG PO TBEC
40.0000 mg | DELAYED_RELEASE_TABLET | Freq: Two times a day (BID) | ORAL | 0 refills | Status: DC
  Filled 2022-10-19: qty 60, 30d supply, fill #0

## 2022-10-19 MED ORDER — ENOXAPARIN SODIUM 80 MG/0.8ML IJ SOSY
70.0000 mg | PREFILLED_SYRINGE | Freq: Two times a day (BID) | INTRAMUSCULAR | Status: DC
  Administered 2022-10-19 – 2022-10-20 (×3): 70 mg via SUBCUTANEOUS
  Filled 2022-10-19 (×4): qty 0.7

## 2022-10-19 NOTE — Assessment & Plan Note (Signed)
Previous echo last year did not demonstrate atrial fibrillation.  Currently rate controlled without any rate controlling medications.  Heart rates in the 70s-80s, with soft blood pressures.  The case was discussed with cardiology and hematology, due to failure of anticoagulation in the past with Eliquis. - Discontinued Eliquis, started Lovenox - Cardiology on board, appreciate recs

## 2022-10-19 NOTE — Progress Notes (Signed)
Mobility Specialist Progress Note:   10/19/22 1200  Mobility  Activity Ambulated with assistance in hallway  Level of Assistance Modified independent, requires aide device or extra time  Assistive Device Front wheel walker  Distance Ambulated (ft) 160 ft  Activity Response Tolerated well  Mobility Referral Yes  $Mobility charge 1 Mobility  Mobility Specialist Start Time (ACUTE ONLY) 1156  Mobility Specialist Stop Time (ACUTE ONLY) 1206  Mobility Specialist Time Calculation (min) (ACUTE ONLY) 10 min    Pre Mobility: 75 HR During Mobility: 99 HR Post Mobility:  94 HR  Pt received in chair, agreeable to mobility. Asymptomatic throughout w/ no complaints. Pt left on EOB with call bell and family present.  D'Vante Earlene Plater Mobility Specialist Please contact via Special educational needs teacher or Rehab office at [phone]

## 2022-10-19 NOTE — Progress Notes (Signed)
Daily Progress Note Intern Pager: [phone]  Patient name: Arthur [name] Medical record number: [id number] Date of birth: 12/14/1940 Age: 82 y.o. Gender: male  Primary Care Provider: Carney Living, MD Consultants: GI, Cardiology, Hematology Code Status: Full Code  Pt Overview and Major Events to Date:  9/18: Admitted + 2u pRBC 9/19: EGD + 1u pRBC 9/20: Restarted Plavix and Eliquis 9/21: Discontinued Eliquis, started Lovenox  Assessment and Plan: Arthur [name] is a 82 y.o. male with past medical history of IV NSC carcinoma of the lung, CAD, T2DM, HLD, HTN, and hx of PE and stroke presenting with symptomatic anemia w/ positive hemoccult test.  Assessment & Plan Symptomatic anemia due to GI blood loss in the setting of duodenal ulcer, acute on chronic anemia History of anemia, multifactorial in nature (IDA, hemolytic, chronic disease). Patient is hemodynamically stable at 8.6.  Labs demonstrate elevated indirect bilirubin, LDH wnl, history of hemolytic anemia in the past - PM CBC, Iron, TIBC, Ferritin, may consider iron infusion - Pending haptoglobin - Continue Trigels-F Forte supplementation - Monitor for signs of bleeding Chronic anticoagulation Hematology consulted for further recommendations regarding anticoagulation given his complex history. Patient has hx of PE/DVT and ischemic stroke. Has been on Xarelto which was discontinued in 2018 and transition to Eliquis 5 mg twice daily.  However after being on Eliquis had a ischemic stroke with embolic showers, the largest in the left PCA and BLE DVTs.  Due to having failure on Eliquis he is to transition to Lovenox and Plavix.  He had left thigh hematoma last after being on Lovenox likely due to poor technique and this was discontinued.  An IVC filter was placed in April 2023, August 2023 had another LLE DVT Dr. Shirline Frees from oncology reduced his Eliquis to 2.5 twice daily and has maintained this dose since. - EGD  currently demonstrated large non-bleeding duodenal ulcer, repeat EGD in 6-8 weeks if anemia does not improve. - Discontinued Eliquis, started Lovenox per Hematology and Cardiology due to recurrent episodes of thromboembolic events on Eliquis. - In the setting of acute bleed Lovenox would be more easily reversible than other anticoagulation agents - Family is hesitant with Lovenox but amenable to trialing it due to history - Hematology on board, appreciate recs - Monitor Hgb with AM CBC Paroxysmal atrial fibrillation (HCC) Previous echo last year did not demonstrate atrial fibrillation.  Currently rate controlled without any rate controlling medications.  Heart rates in the 70s-80s, with soft blood pressures.  The case was discussed with cardiology and hematology, due to failure of anticoagulation in the past with Eliquis. - Discontinued Eliquis, started Lovenox - Cardiology on board, appreciate recs GI bleed S/p EGD 9/19 demonstrating a nonbleeding duodenal ulcer per GI. GI provided recommendations for management and signed off, appreciate their assistance and care. - Pantoprazole 40 mg BID for 8 weeks (end 10/14), then once daily indefinitely - Repeat EGD in 6 to 8 weeks to monitor nonbleeding duodenal ulcer, if anemia does not improve - Biopsies pending  Chronic and Stable Problems:  Adenocarcinoma of R Lung, Stage 4: Follows with [facility], Alectinib 600 mg BID with meals T2DM: A1c 2 months ago qat 4.9. Diet controlled. Hx of PE: IVC filter in place. Pt was on Elqiuis 2.5 mg, but seems to have failed Eliquis therapy, discontinued this admission and started on Lovenox. Hx of primary angioplasty w/ coronary stent: Stent in 2023. Continued Plavix and Rosuvastatin.   FEN/GI: Vegetarian diet PPx: Lovenox Dispo:  home likely tomorrow pending stable Hgb on Lovenox  Subjective:  Patient is in high spirits this morning and reports that he is doing very well.  Denies any concerns this  morning.  Reports that he had a bowel movement but no blood noted.  He reports that his stools are dark due to iron supplementation.  He understands that he must be transition to Lovenox for anticoagulation and reports that he has done this in the past.  Objective: Temp:  [98.6 F (37 C)-99 F (37.2 C)] 99 F (37.2 C) (09/21 0833) Pulse Rate:  [77-91] 81 (09/21 0833) Resp:  [18-23] 20 (09/21 0833) BP: (100-130)/(48-73) 125/48 (09/21 0833) SpO2:  [88 %-100 %] 94 % (09/21 0857) Physical Exam: General: Awake and Alert in NAD HEENT: Normocephalic, atraumatic. Conjunctiva normal. No nasal discharge Cardiovascular: Irregularly irregular. No M/R/G Respiratory: CTAB, normal WOB on 2 L. No wheezing, crackles, rhonchi, or diminished breath sounds. Abdomen: Soft, non-tender, non-distended. Bowel sounds normoactive Extremities: 1+ BLE edema, no deformities or significant joint findings. Skin: Warm and dry. Neuro: A&Ox3. No focal neurological deficits.  Laboratory: Most recent CBC Lab Results  Component Value Date   WBC 7.6 10/19/2022   HGB 8.6 (L) 10/19/2022   HCT 26.2 (L) 10/19/2022   MCV 93.2 10/19/2022   PLT 231 10/19/2022   Most recent BMP    Latest Ref Rng & Units 10/19/2022    2:30 AM  BMP  Glucose 70 - 99 mg/dL 562   BUN 8 - 23 mg/dL 20   Creatinine 1.30 - 1.24 mg/dL 8.65   Sodium 784 - 696 mmol/L 137   Potassium 3.5 - 5.1 mmol/L 4.0   Chloride 98 - 111 mmol/L 103   CO2 22 - 32 mmol/L 24   Calcium 8.9 - 10.3 mg/dL 8.1     Imaging/Diagnostic Tests: No new imaging.  Fortunato Curling, DO 10/19/2022, 10:36 AM PGY-1, [facility]  FPTS Intern pager: [phone], text pages welcome Secure chat group [facility] Teaching Service

## 2022-10-19 NOTE — Assessment & Plan Note (Signed)
S/p EGD 9/19 demonstrating a nonbleeding duodenal ulcer per GI. GI provided recommendations for management and signed off, appreciate their assistance and care. - Pantoprazole 40 mg BID for 8 weeks (end 10/14), then once daily indefinitely - Repeat EGD in 6 to 8 weeks to monitor nonbleeding duodenal ulcer, if anemia does not improve - Biopsies pending

## 2022-10-19 NOTE — Assessment & Plan Note (Signed)
Hematology consulted for further recommendations regarding anticoagulation given his complex history. Patient has hx of PE/DVT and ischemic stroke. Has been on Xarelto which was discontinued in 2018 and transition to Eliquis 5 mg twice daily.  However after being on Eliquis had a ischemic stroke with embolic showers, the largest in the left PCA and BLE DVTs.  Due to having failure on Eliquis he is to transition to Lovenox and Plavix.  He had left thigh hematoma last after being on Lovenox likely due to poor technique and this was discontinued.  An IVC filter was placed in April 2023, August 2023 had another LLE DVT Dr. Shirline Frees from oncology reduced his Eliquis to 2.5 twice daily and has maintained this dose since. - EGD currently demonstrated large non-bleeding duodenal ulcer, repeat EGD in 6-8 weeks if anemia does not improve. - Discontinued Eliquis, started Lovenox per Hematology and Cardiology due to recurrent episodes of thromboembolic events on Eliquis. - In the setting of acute bleed Lovenox would be more easily reversible than other anticoagulation agents - Family is hesitant with Lovenox but amenable to trialing it due to history - Hematology on board, appreciate recs - Monitor Hgb with AM CBC

## 2022-10-19 NOTE — Assessment & Plan Note (Signed)
History of anemia, multifactorial in nature (IDA, hemolytic, chronic disease). Patient is hemodynamically stable at 8.6.  Labs demonstrate elevated indirect bilirubin, LDH wnl, history of hemolytic anemia in the past - PM CBC, Iron, TIBC, Ferritin, may consider iron infusion - Pending haptoglobin - Continue Trigels-F Forte supplementation - Monitor for signs of bleeding

## 2022-10-20 ENCOUNTER — Encounter (HOSPITAL_COMMUNITY): Payer: Self-pay | Admitting: Gastroenterology

## 2022-10-20 DIAGNOSIS — K922 Gastrointestinal hemorrhage, unspecified: Secondary | ICD-10-CM

## 2022-10-20 DIAGNOSIS — K269 Duodenal ulcer, unspecified as acute or chronic, without hemorrhage or perforation: Secondary | ICD-10-CM

## 2022-10-20 DIAGNOSIS — I480 Paroxysmal atrial fibrillation: Secondary | ICD-10-CM | POA: Diagnosis not present

## 2022-10-20 DIAGNOSIS — Z7901 Long term (current) use of anticoagulants: Secondary | ICD-10-CM | POA: Diagnosis not present

## 2022-10-20 DIAGNOSIS — D649 Anemia, unspecified: Secondary | ICD-10-CM | POA: Diagnosis not present

## 2022-10-20 LAB — CBC
HCT: 26 % — ABNORMAL LOW (ref 39.0–52.0)
Hemoglobin: 8.4 g/dL — ABNORMAL LOW (ref 13.0–17.0)
MCH: 29.8 pg (ref 26.0–34.0)
MCHC: 32.3 g/dL (ref 30.0–36.0)
MCV: 92.2 fL (ref 80.0–100.0)
Platelets: 227 10*3/uL (ref 150–400)
RBC: 2.82 MIL/uL — ABNORMAL LOW (ref 4.22–5.81)
RDW: 17.6 % — ABNORMAL HIGH (ref 11.5–15.5)
WBC: 5.7 10*3/uL (ref 4.0–10.5)
nRBC: 0.7 % — ABNORMAL HIGH (ref 0.0–0.2)

## 2022-10-20 LAB — BASIC METABOLIC PANEL
Anion gap: 6 (ref 5–15)
BUN: 22 mg/dL (ref 8–23)
CO2: 25 mmol/L (ref 22–32)
Calcium: 7.8 mg/dL — ABNORMAL LOW (ref 8.9–10.3)
Chloride: 105 mmol/L (ref 98–111)
Creatinine, Ser: 1.48 mg/dL — ABNORMAL HIGH (ref 0.61–1.24)
GFR, Estimated: 47 mL/min — ABNORMAL LOW (ref >60)
Glucose, Bld: 119 mg/dL — ABNORMAL HIGH (ref 70–99)
Potassium: 4.4 mmol/L (ref 3.5–5.1)
Sodium: 136 mmol/L (ref 135–145)

## 2022-10-20 LAB — HAPTOGLOBIN: Haptoglobin: <10 mg/dL — ABNORMAL LOW (ref 38–329)

## 2022-10-20 LAB — GLUCOSE, CAPILLARY: Glucose-Capillary: 92 mg/dL (ref 70–99)

## 2022-10-20 MED ORDER — PANTOPRAZOLE SODIUM 40 MG PO TBEC
40.0000 mg | DELAYED_RELEASE_TABLET | Freq: Two times a day (BID) | ORAL | 0 refills | Status: DC
Start: 2022-10-20 — End: 2022-11-28
  Filled 2022-10-20: qty 60, 30d supply, fill #0

## 2022-10-20 NOTE — Progress Notes (Signed)
Patient and son verbalized understanding of dc instructions. Home med and belongings given to patient.

## 2022-10-20 NOTE — Progress Notes (Signed)
Mobility Specialist Progress Note:   10/20/22 1000  Mobility  Activity Ambulated with assistance in hallway  Level of Assistance Modified independent, requires aide device or extra time  Assistive Device Front wheel walker  Distance Ambulated (ft) 200 ft  Activity Response Tolerated well  Mobility Referral Yes  $Mobility charge 1 Mobility  Mobility Specialist Start Time (ACUTE ONLY) 1013  Mobility Specialist Stop Time (ACUTE ONLY) 1023  Mobility Specialist Time Calculation (min) (ACUTE ONLY) 10 min    Pre Mobility: 80 HR,  99% SpO2 During Mobility: 109 HR Post Mobility:  91 HR,  92% SpO2  Received pt in chair having no complaints and agreeable to mobility. Pt was asymptomatic throughout ambulation and returned to room w/o fault. Left in chair w/ call bell in reach and all needs met.   D'Vante Earlene Plater Mobility Specialist Please contact via Special educational needs teacher or Rehab office at [phone]

## 2022-10-20 NOTE — Assessment & Plan Note (Signed)
--  Alectinib 150mg  4 capsules BID, follows with [facility]

## 2022-10-20 NOTE — Assessment & Plan Note (Signed)
Patient comes in for symptomatic anemia found to have positive Hemoccult test, concerning for GI bleed.  GI has been consulted will follow-up in the morning. - Insert 2 large-bore IV - Protonix - GI following appreciate recs - N.p.o.

## 2022-10-20 NOTE — Assessment & Plan Note (Addendum)
Remained stable, hemoglobin 8.4.  No signs of bleeding.  Iron 49, ferritin 285. - Continue Trigels-F Forte supplementation - EGD w/ large non-bleeding duodenal ulcer, GI plan to repeat EGD in 6-8 weeks if anemia does not improve. - Pantoprazole 40 mg BID for 8 weeks (end 10/14), then once daily indefinitely - Repeat EGD in 6 to 8 weeks to monitor nonbleeding duodenal ulcer, if anemia does not improve

## 2022-10-20 NOTE — Discharge Summary (Signed)
Family Medicine Teaching [facility] Discharge Summary  Patient name: Arthur [name] Medical record number: [id number] Date of birth: [birth year] Age: 82 y.o. Gender: male Date of Admission: 10/15/2022  Date of Discharge: 10/20/22 Admitting Physician: Billey Co, MD  Primary Care Provider: Carney Living, MD Consultants: GI  Indication for Hospitalization: Symptomatic anemia  Discharge Diagnoses/Problem List:  Principal Problem for Admission:  Other Problems addressed during stay:  Principal Problem:   Symptomatic anemia due to GI blood loss in the setting of duodenal ulcer, acute on chronic anemia Active Problems:   Adenocarcinoma of right lung, stage 4 (HCC)   Diabetes mellitus, type II (HCC)   Paroxysmal atrial fibrillation [facility])   Chronic anticoagulation    Brief Hospital Course:  Arthur [name] is a 82 y.o.male with a history of stage IV non-small cell carcinoma of lung, CAD, PE, HLD, HTN, PE, anemia who was admitted to the [facility] Medicine Teaching Service at [facility] for symptomatic anemia. His hospital course is detailed below:  Symptomatic Anemia Pt had labs drawn in clinic a few days ago, resulted in Hgb 5.1 so he was advised to come to the ED. In the ED, pt reported fatigue over the last few days and his Hgb was 4.8. Hemoccult testing was positive. Pt was given 2u PRBCs. His hemoglobin improved to 8.3. GI performed an EGD demonstrating a non-bleeding duodenal ulcer with no stigmata of bleeding, which likely may the source of his bleeding and non-bleeding duodenal diverticulum. Hemolysis labs demonstrated elevated bilirubin, normal LDH. They advised starting Protonix 40 mg BID for 8 weeks and then once daily indefinitely.   Chronic Anticoagulation  With patient's history of recurrent episodes of thromboembolic events on Eliquis, there is concern for failure of Eliquis. Hematology and Cardiology expressed concern that Eliquis/Xarelto may not be optimal given  his Eliquis failure and the best option moving forward would be Lovenox, as it is more easily reversible than other anticoagulation agents even though he has had hx of hematomas on this in the past. Plavix resumed.  Paroxysmal Atrial Fibrillation New onset PAF was diagnosed during this admission and was not seen on previous Echo. Patient remained rate controlled (70-80s with soft blood pressures) upon admission and did not require rate controlling medications. Lovenox was initiated per cardiology and hematology recommendations.  Other chronic conditions were medically managed with home medications and formulary alternatives as necessary (CAD, HLD, HTN) Adenocarcinoma of R Lung, Stage 4: Follows with [facility], Alectinib 600 mg BID with meals T2DM: A1c 2 months ago qat 4.9. Diet controlled. Hx of PE: IVC filter in place. Pt was on Elqiuis 2.5 mg, but seems to have failed Eliquis therapy, discontinued this admission and started on Lovenox. Hx of primary angioplasty w/ coronary stent: Stent in 2023. Continued Plavix and Rosuvastatin.  HTN: soft blood pressures on admission HLD: continue Rosuvastatin 5 mg daily Edema: continued Lasix 20 mg daily   Constipation: continue Linzess 72 mcg capsule daily or every other day  PCP Follow-up Recommendations: GI will consider EGD in 6-8 weeks (10/31-11/14) weeks to assess healing of duodenal ulcer if anemia doesn't improve  Disposition: Home  Discharge Condition: Stable  Discharge Exam: (per 9/22 progress note) Vitals:   10/19/22 2329 10/20/22 0333  BP: (!) 110/52 (!) 110/51  Pulse: 79 79  Resp: 17 18  Temp: 98.6 F (37 C) 98.5 F (36.9 C)  SpO2: 97% 95%  General: Pleasant, elderly, resting in bed Cardiovascular: RRR Respiratory: Normal WOB on room air.  Speaks in full sentences. Abdomen: Soft, NTND Extremities: Warm, well perfused with no edema   Significant Procedures: EGD  Significant Labs and Imaging:  Recent Labs  Lab  10/19/22 0230 10/19/22 1643 10/20/22 0226  WBC 7.6 5.6 5.7  HGB 8.6* 9.4* 8.4*  HCT 26.2* 28.9* 26.0*  PLT 231 253 227   Recent Labs  Lab 10/19/22 0230 10/20/22 0226  NA 137 136  K 4.0 4.4  CL 103 105  CO2 24 25  GLUCOSE 119* 119*  BUN 20 22  CREATININE 1.47* 1.48*  CALCIUM 8.1* 7.8*     Discharge Medications:  Allergies as of 10/20/2022       Reactions   Ace Inhibitors Cough   Iohexol    Severe ATN after CT with contrast May 2023 - creatinine peaked near 8. Did not require HD.        Medication List     STOP taking these medications    Eliquis 2.5 MG Tabs tablet Generic drug: apixaban       TAKE these medications    Alecensa 150 MG capsule Generic drug: alectinib Take 4 capsules (600 mg total) by mouth 2 (two) times daily with a meal.   cetirizine 10 MG tablet Commonly known as: ZYRTEC Take 1 tablet (10 mg total) by mouth daily.   clopidogrel 75 MG tablet Commonly known as: PLAVIX Take 1 tablet by mouth once daily with breakfast   enoxaparin 80 MG/0.8ML injection Commonly known as: LOVENOX Inject 0.7 mLs (70 mg total) into the skin every 12 (twelve) hours.   furosemide 20 MG tablet Commonly known as: LASIX Take 20 mg by mouth daily as needed for edema (lower extremites).   Fusion Plus Caps Take 1 capsule by mouth daily.   linaclotide 72 MCG capsule Commonly known as: Linzess Take daily or every other day as needed   pantoprazole 40 MG tablet Commonly known as: PROTONIX Take 1 tablet (40 mg total) by mouth 2 (two) times daily for 23 days.   polyethylene glycol 17 g packet Commonly known as: MIRALAX / GLYCOLAX Take 17 g by mouth 2 (two) times daily.   rosuvastatin 5 MG tablet Commonly known as: CRESTOR Take 1 tablet by mouth once daily   senna 8.6 MG Tabs tablet Commonly known as: SENOKOT Take 1 tablet (8.6 mg total) by mouth daily.   sodium chloride 0.65 % Soln nasal spray Commonly known as: OCEAN Place 1 spray into both  nostrils as needed for congestion.               Durable Medical Equipment  (From admission, onward)           Start     Ordered   10/18/22 1150  For home use only DME Walker rolling  Once       Question Answer Comment  Walker: With 5 Inch Wheels   Patient needs a walker to treat with the following condition Weakness      10/18/22 1150            Discharge Instructions: Please refer to Patient Instructions section of EMR for full details.  Patient was counseled important signs and symptoms that should prompt return to medical care, changes in medications, dietary instructions, activity restrictions, and follow up appointments.   Follow-Up Appointments:  Follow-up Information     Medicaid Transportation Follow up.   Contact information: phone [phone]        [facility] Follow up.   Specialty:  Home Health Services Contact information: [address] [place] [address] [place] [address] [phone]         Jodelle Red, MD Follow up.   Specialty: Cardiology Why: Tuesday Nov 19, 2022 Appt at 2:20 PM (20 min) Contact information: [address] [place] [address] [phone]                 Darral Dash, DO 10/20/2022, 7:08 AM PGY-3, [facility] Family Medicine

## 2022-10-20 NOTE — Progress Notes (Addendum)
Daily Progress Note Intern Pager: [phone]  Patient name: Arthur [name] Medical record number: [id number] Date of birth: [birth year] Age: 82 y.o. Gender: male  Primary Care Provider: Carney Living, MD Consultants: GI, cardiology Code Status: Full  Pt Overview and Major Events to Date:    Assessment and Plan: Terel R Krenek is an 82 year old male who presented with symptomatic anemia due to GI bleed with use of Plavix and Eliquis. Pertinent PMH/PSH includes stage IV adenocarcinoma of right lung, CAD, T2DM, hyperlipidemia, hypertension, history of PE.  Assessment & Plan Symptomatic anemia due to GI blood loss in the setting of duodenal ulcer, acute on chronic anemia Remained stable, hemoglobin 8.4.  No signs of bleeding.  Iron 49, ferritin 285. - Continue Trigels-F Forte supplementation - EGD w/ large non-bleeding duodenal ulcer, GI plan to repeat EGD in 6-8 weeks if anemia does not improve. - Pantoprazole 40 mg BID for 8 weeks (end 10/14), then once daily indefinitely - Repeat EGD in 6 to 8 weeks to monitor nonbleeding duodenal ulcer, if anemia does not improve Chronic anticoagulation Hematology consulted for further recommendations regarding anticoagulation given his complex history. Patient has hx of PE/DVT and ischemic stroke. - Discontinued Eliquis, started Lovenox per Hematology and Cardiology due to recurrent episodes of thromboembolic events on Eliquis. -Continue Plavix 75 mg daily Paroxysmal atrial fibrillation (HCC) Rate controlled. -On Lovenox 70 mg every 12 hours Diabetes mellitus, type II (HCC) -- Diet Controlled Adenocarcinoma of right lung, stage 4 (HCC) -- Alectinib 150mg  4 capsules BID, follows with [facility]   FEN/GI: Regular diet (patient is vegetarian) PPx: Lovenox 70 mg q12 hours (sent to [facility] for discharge, should be available at Main Pharmacy) Dispo:Home  today or tomorrow .   Subjective:  Feeling well, no complaints. No  questions.  Objective: Temp:  [98.5 F (36.9 C)-99 F (37.2 C)] 98.5 F (36.9 C) (09/22 0333) Pulse Rate:  [70-81] 79 (09/22 0333) Resp:  [17-20] 18 (09/22 0333) BP: (109-125)/(45-61) 110/51 (09/22 0333) SpO2:  [89 %-98 %] 95 % (09/22 0333) Physical Exam: General: Pleasant, elderly, resting in bed Cardiovascular: RRR Respiratory: Normal WOB on room air. Speaks in full sentences. Abdomen: Soft, NTND Extremities: Warm, well perfused with no edema  Laboratory: Most recent CBC Lab Results  Component Value Date   WBC 5.7 10/20/2022   HGB 8.4 (L) 10/20/2022   HCT 26.0 (L) 10/20/2022   MCV 92.2 10/20/2022   PLT 227 10/20/2022   Most recent BMP    Latest Ref Rng & Units 10/20/2022    2:26 AM  BMP  Glucose 70 - 99 mg/dL 841   BUN 8 - 23 mg/dL 22   Creatinine 6.60 - 1.24 mg/dL 6.30   Sodium 160 - 109 mmol/L 136   Potassium 3.5 - 5.1 mmol/L 4.4   Chloride 98 - 111 mmol/L 105   CO2 22 - 32 mmol/L 25   Calcium 8.9 - 10.3 mg/dL 7.8     Darral Dash, DO 10/20/2022, 7:07 AM  PGY-3, [place] [facility] FPTS Intern pager: [phone], text pages welcome Secure chat group [facility] Teaching Service

## 2022-10-20 NOTE — Assessment & Plan Note (Signed)
Diet  Controlled.

## 2022-10-20 NOTE — Assessment & Plan Note (Addendum)
Rate controlled. -On Lovenox 70 mg every 12 hours

## 2022-10-20 NOTE — TOC Transition Note (Signed)
Transition of Care [facility]) - CM/SW Discharge Note   Patient Details  Name: Arthur [name] MRN: [id number] Date of Birth: [birth year]  Transition of [facility]) CM/SW Contact:  [name] Bacon, RN Phone Number: 10/20/2022, 10:10 AM   Clinical Narrative:   Patient is being discharged today. [facility]- [facility] made aware.    Final next level of care: Home w Home Health Services Barriers to Discharge: No Barriers Identified   Patient Goals and CMS Choice CMS Medicare.gov Compare Post Acute Care list provided to:: Patient Choice offered to / list presented to : Patient  Discharge Placement                         Discharge Plan and Services Additional resources added to the After Visit Summary for     Discharge Planning Services: CM Consult Post Acute Care Choice: Durable Medical Equipment, Home Health          DME Arranged: Walker rolling DME Agency: [employer] Date DME Agency Contacted: 10/18/22 Time DME Agency Contacted: 1201 Representative spoke with at DME Agency: Marthann Schiller HH Arranged: PT HH Agency: [facility] Date [facility] Agency Contacted: 10/18/22 Time HH Agency Contacted: 1201 Representative spoke with at [facility] Agency: Tresa Endo  Social Determinants of Health (SDOH) Interventions SDOH Screenings   Food Insecurity: No Food Insecurity (10/17/2022)  Housing: Low Risk  (10/17/2022)  Transportation Needs: No Transportation Needs (10/17/2022)  Recent Concern: Transportation Needs - Unmet Transportation Needs (08/09/2022)  Utilities: Not At Risk (10/17/2022)  Depression (PHQ2-9): Low Risk  (10/03/2022)  Tobacco Use: Low Risk  (10/17/2022)     Readmission Risk Interventions    06/19/2021    4:57 PM  Readmission Risk Prevention Plan  Transportation Screening Complete  PCP or Specialist Appt within 3-5 Days Complete  HRI or Home Care Consult Complete  Social Work Consult for Recovery Care Planning/Counseling Complete  Medication Review [employer]) Complete

## 2022-10-20 NOTE — Assessment & Plan Note (Signed)
Patient comes in for symptomatic anemia found to have positive Hemoccult test, concerning for GI bleed.  GI has been consulted appreciate their recommedations.  - Protonix - GI following appreciate recs - N.p.o.

## 2022-10-20 NOTE — Assessment & Plan Note (Signed)
Hematology consulted for further recommendations regarding anticoagulation given his complex history. Patient has hx of PE/DVT and ischemic stroke. - Discontinued Eliquis, started Lovenox per Hematology and Cardiology due to recurrent episodes of thromboembolic events on Eliquis. -Continue Plavix 75 mg daily

## 2022-10-21 ENCOUNTER — Other Ambulatory Visit (HOSPITAL_COMMUNITY): Payer: Self-pay

## 2022-10-21 DIAGNOSIS — N289 Disorder of kidney and ureter, unspecified: Secondary | ICD-10-CM | POA: Diagnosis not present

## 2022-10-21 DIAGNOSIS — I10 Essential (primary) hypertension: Secondary | ICD-10-CM | POA: Diagnosis not present

## 2022-10-21 DIAGNOSIS — R54 Age-related physical debility: Secondary | ICD-10-CM | POA: Diagnosis not present

## 2022-10-21 DIAGNOSIS — I480 Paroxysmal atrial fibrillation: Secondary | ICD-10-CM | POA: Diagnosis not present

## 2022-10-21 DIAGNOSIS — M21372 Foot drop, left foot: Secondary | ICD-10-CM | POA: Diagnosis not present

## 2022-10-21 DIAGNOSIS — D630 Anemia in neoplastic disease: Secondary | ICD-10-CM | POA: Diagnosis not present

## 2022-10-21 DIAGNOSIS — I2510 Atherosclerotic heart disease of native coronary artery without angina pectoris: Secondary | ICD-10-CM | POA: Diagnosis not present

## 2022-10-21 DIAGNOSIS — Z8673 Personal history of transient ischemic attack (TIA), and cerebral infarction without residual deficits: Secondary | ICD-10-CM | POA: Diagnosis not present

## 2022-10-21 DIAGNOSIS — E785 Hyperlipidemia, unspecified: Secondary | ICD-10-CM | POA: Diagnosis not present

## 2022-10-21 DIAGNOSIS — K264 Chronic or unspecified duodenal ulcer with hemorrhage: Secondary | ICD-10-CM | POA: Diagnosis not present

## 2022-10-21 DIAGNOSIS — K5900 Constipation, unspecified: Secondary | ICD-10-CM | POA: Diagnosis not present

## 2022-10-21 DIAGNOSIS — M1990 Unspecified osteoarthritis, unspecified site: Secondary | ICD-10-CM | POA: Diagnosis not present

## 2022-10-21 DIAGNOSIS — I2583 Coronary atherosclerosis due to lipid rich plaque: Secondary | ICD-10-CM | POA: Diagnosis not present

## 2022-10-21 DIAGNOSIS — M109 Gout, unspecified: Secondary | ICD-10-CM | POA: Diagnosis not present

## 2022-10-21 DIAGNOSIS — C3491 Malignant neoplasm of unspecified part of right bronchus or lung: Secondary | ICD-10-CM | POA: Diagnosis not present

## 2022-10-21 DIAGNOSIS — K219 Gastro-esophageal reflux disease without esophagitis: Secondary | ICD-10-CM | POA: Diagnosis not present

## 2022-10-21 DIAGNOSIS — E119 Type 2 diabetes mellitus without complications: Secondary | ICD-10-CM | POA: Diagnosis not present

## 2022-10-21 DIAGNOSIS — Z86718 Personal history of other venous thrombosis and embolism: Secondary | ICD-10-CM | POA: Diagnosis not present

## 2022-10-21 DIAGNOSIS — Z7902 Long term (current) use of antithrombotics/antiplatelets: Secondary | ICD-10-CM | POA: Diagnosis not present

## 2022-10-21 DIAGNOSIS — Z86711 Personal history of pulmonary embolism: Secondary | ICD-10-CM | POA: Diagnosis not present

## 2022-10-21 DIAGNOSIS — D500 Iron deficiency anemia secondary to blood loss (chronic): Secondary | ICD-10-CM | POA: Diagnosis not present

## 2022-10-21 DIAGNOSIS — Z955 Presence of coronary angioplasty implant and graft: Secondary | ICD-10-CM | POA: Diagnosis not present

## 2022-10-21 LAB — SURGICAL PATHOLOGY

## 2022-10-22 DIAGNOSIS — K264 Chronic or unspecified duodenal ulcer with hemorrhage: Secondary | ICD-10-CM | POA: Diagnosis not present

## 2022-10-22 DIAGNOSIS — Z955 Presence of coronary angioplasty implant and graft: Secondary | ICD-10-CM | POA: Diagnosis not present

## 2022-10-22 DIAGNOSIS — K5900 Constipation, unspecified: Secondary | ICD-10-CM | POA: Diagnosis not present

## 2022-10-22 DIAGNOSIS — M1990 Unspecified osteoarthritis, unspecified site: Secondary | ICD-10-CM | POA: Diagnosis not present

## 2022-10-22 DIAGNOSIS — K219 Gastro-esophageal reflux disease without esophagitis: Secondary | ICD-10-CM | POA: Diagnosis not present

## 2022-10-22 DIAGNOSIS — Z86718 Personal history of other venous thrombosis and embolism: Secondary | ICD-10-CM | POA: Diagnosis not present

## 2022-10-22 DIAGNOSIS — Z86711 Personal history of pulmonary embolism: Secondary | ICD-10-CM | POA: Diagnosis not present

## 2022-10-22 DIAGNOSIS — D500 Iron deficiency anemia secondary to blood loss (chronic): Secondary | ICD-10-CM | POA: Diagnosis not present

## 2022-10-22 DIAGNOSIS — E785 Hyperlipidemia, unspecified: Secondary | ICD-10-CM | POA: Diagnosis not present

## 2022-10-22 DIAGNOSIS — M109 Gout, unspecified: Secondary | ICD-10-CM | POA: Diagnosis not present

## 2022-10-22 DIAGNOSIS — Z7902 Long term (current) use of antithrombotics/antiplatelets: Secondary | ICD-10-CM | POA: Diagnosis not present

## 2022-10-22 DIAGNOSIS — R54 Age-related physical debility: Secondary | ICD-10-CM | POA: Diagnosis not present

## 2022-10-22 DIAGNOSIS — M21372 Foot drop, left foot: Secondary | ICD-10-CM | POA: Diagnosis not present

## 2022-10-22 DIAGNOSIS — D630 Anemia in neoplastic disease: Secondary | ICD-10-CM | POA: Diagnosis not present

## 2022-10-22 DIAGNOSIS — E119 Type 2 diabetes mellitus without complications: Secondary | ICD-10-CM | POA: Diagnosis not present

## 2022-10-22 DIAGNOSIS — N289 Disorder of kidney and ureter, unspecified: Secondary | ICD-10-CM | POA: Diagnosis not present

## 2022-10-22 DIAGNOSIS — I2510 Atherosclerotic heart disease of native coronary artery without angina pectoris: Secondary | ICD-10-CM | POA: Diagnosis not present

## 2022-10-22 DIAGNOSIS — Z8673 Personal history of transient ischemic attack (TIA), and cerebral infarction without residual deficits: Secondary | ICD-10-CM | POA: Diagnosis not present

## 2022-10-22 DIAGNOSIS — I10 Essential (primary) hypertension: Secondary | ICD-10-CM | POA: Diagnosis not present

## 2022-10-22 DIAGNOSIS — C3491 Malignant neoplasm of unspecified part of right bronchus or lung: Secondary | ICD-10-CM | POA: Diagnosis not present

## 2022-10-22 DIAGNOSIS — I2583 Coronary atherosclerosis due to lipid rich plaque: Secondary | ICD-10-CM | POA: Diagnosis not present

## 2022-10-22 DIAGNOSIS — I480 Paroxysmal atrial fibrillation: Secondary | ICD-10-CM | POA: Diagnosis not present

## 2022-10-22 NOTE — Progress Notes (Signed)
Arthur [name],  The biopsies of your duodenum did not show any evidence of celiac disease.  The biopsies of the ulcer did not show any evidence of cancer/malignancy.  The biopsies taken from your stomach were notable for mild chronic gastritis (inflammation) which is a common finding, but there was no evidence of Helicobacter pylori infection.   Ulcers are often caused by medications such as NSAIDs and aspirin.  The ulcer will eventually heal by reducing stomach and avoiding these medications.  Follow up with your PCP to monitor your blood counts.

## 2022-10-22 NOTE — Progress Notes (Unsigned)
    SUBJECTIVE:   CHIEF COMPLAINT / HPI:   Anemia epeat CBC at follow up  Patient given 30 day supply of lovenox from transitions of care pharmacy- please send remainder of refills to [facility] (may need to test cost of this)     PERTINENT  PMH / PSH: *** Patient Active Problem List   Diagnosis Date Noted   Adenocarcinoma of right lung, stage 4 (HCC) 04/09/2021    Priority: Medium    Symptomatic anemia due to GI blood loss in the setting of duodenal ulcer, acute on chronic anemia 09/06/2015    Priority: Medium    Diabetes mellitus, type II (HCC) 09/22/2009    Priority: Low   Gastrointestinal hemorrhage 10/20/2022   Chronic anticoagulation 10/19/2022   Paroxysmal atrial fibrillation (HCC) 10/18/2022   Gassiness 08/14/2022   Constipation 08/07/2022   Atypical pneumonia 11/07/2021   Renal insufficiency 07/04/2021   Foot drop, left foot 07/04/2021   Non-small cell carcinoma of lung, stage 4 (HCC) 06/15/2021   Encounter for antineoplastic chemotherapy 06/13/2021   Deep venous embolism and thrombosis (HCC) 05/16/2021   Inguinal hernia 05/16/2021   Cerebral embolism with cerebral infarction 03/18/2021   Status post coronary artery stent placement    Coronary artery disease due to lipid rich plaque    Pulmonary embolus (HCC) 02/03/2021   GERD (gastroesophageal reflux disease) 07/05/2013   Cough 03/19/2011   Arthritis 02/26/2010   Gout 11/06/2009   Hyperlipidemia 09/22/2009   Essential hypertension, benign 09/22/2009    Current Outpatient Medications  Medication Instructions   Alecensa 600 mg, Oral, 2 times daily with meals   cetirizine (ZYRTEC) 10 mg, Oral, Daily   clopidogrel (PLAVIX) 75 mg, Oral, Daily with breakfast   enoxaparin (LOVENOX) 70 mg, Subcutaneous, Every 12 hours   furosemide (LASIX) 20 mg, Daily PRN   Iron-FA-B Cmp-C-Biot-Probiotic (FUSION PLUS) CAPS 1 capsule, Oral, Daily   linaclotide (LINZESS) 72 MCG capsule Take daily or every other day as needed    pantoprazole (PROTONIX) 40 mg, Oral, 2 times daily, Then daily thereafter forever   polyethylene glycol (MIRALAX / GLYCOLAX) 17 g, Oral, 2 times daily   rosuvastatin (CRESTOR) 5 mg, Oral, Daily   senna (SENOKOT) 8.6 mg, Oral, Daily   sodium chloride (OCEAN) 0.65 % SOLN nasal spray 1 spray, Each Nare, As needed       10/20/2022   11:29 AM 10/20/2022    8:00 AM 10/20/2022    3:33 AM  Vitals with BMI  Systolic 124 101 161  Diastolic 51 56 51  Pulse 71  79      OBJECTIVE:   There were no vitals taken for this visit.  ***  ASSESSMENT/PLAN:   There are no diagnoses linked to this encounter.   There are no Patient Instructions on file for this visit.   Carney Living, MD [facility]

## 2022-10-23 ENCOUNTER — Ambulatory Visit (INDEPENDENT_AMBULATORY_CARE_PROVIDER_SITE_OTHER): Payer: 59 | Admitting: Family Medicine

## 2022-10-23 ENCOUNTER — Other Ambulatory Visit: Payer: Self-pay

## 2022-10-23 ENCOUNTER — Other Ambulatory Visit (HOSPITAL_COMMUNITY): Payer: Self-pay

## 2022-10-23 VITALS — BP 137/53 | HR 80

## 2022-10-23 DIAGNOSIS — K269 Duodenal ulcer, unspecified as acute or chronic, without hemorrhage or perforation: Secondary | ICD-10-CM | POA: Diagnosis not present

## 2022-10-23 DIAGNOSIS — D649 Anemia, unspecified: Secondary | ICD-10-CM | POA: Diagnosis not present

## 2022-10-23 DIAGNOSIS — I480 Paroxysmal atrial fibrillation: Secondary | ICD-10-CM | POA: Diagnosis not present

## 2022-10-23 NOTE — Assessment & Plan Note (Signed)
Seems stable.  Continue twice a day protonix

## 2022-10-23 NOTE — Patient Instructions (Signed)
Good to see you today - Thank you for coming in  Things we discussed today:  Anemia - keep taking all your medications as you are - I will let you know about the blood test today - Let me know if any shortness of breath or chest pain or bleeding  Ask Dr Shirline Frees about the Lovenox  Please always bring your medication bottles  Come back to see me in 2 months

## 2022-10-23 NOTE — Assessment & Plan Note (Signed)
Stable by exam today.  Check CBC.  Continue current medications.  He has follow up with hematology in October

## 2022-10-24 ENCOUNTER — Telehealth: Payer: Self-pay

## 2022-10-24 LAB — CBC
Hematocrit: 29.9 % — ABNORMAL LOW (ref 37.5–51.0)
Hemoglobin: 9.6 g/dL — ABNORMAL LOW (ref 13.0–17.7)
MCH: 29.7 pg (ref 26.6–33.0)
MCHC: 32.1 g/dL (ref 31.5–35.7)
MCV: 93 fL (ref 79–97)
Platelets: 263 10*3/uL (ref 150–450)
RBC: 3.23 x10E6/uL — ABNORMAL LOW (ref 4.14–5.80)
RDW: 16 % — ABNORMAL HIGH (ref 11.6–15.4)
WBC: 6.2 10*3/uL (ref 3.4–10.8)

## 2022-10-24 NOTE — Telephone Encounter (Signed)
Diannia Ruder from [place] calling for skilled nursing verbal orders as follows:  1 time(s) weekly for 4 week(s)  Verbal orders given per [facility] protocol  Veronda Prude, RN

## 2022-10-25 ENCOUNTER — Telehealth: Payer: Self-pay

## 2022-10-25 ENCOUNTER — Other Ambulatory Visit: Payer: Self-pay | Admitting: Family Medicine

## 2022-10-25 NOTE — Telephone Encounter (Signed)
Dana from [facility] calling for PT verbal orders as follows:  2 time(s) weekly for 4 week(s), then 1 time(s) weekly for 4 week(s)  Verbal orders given per [facility] protocol  Veronda Prude, RN

## 2022-10-25 NOTE — Telephone Encounter (Signed)
Dana from [facility] calling for skilled nursing verbal orders as follows:  1 time(s) weekly for 4 week(s)  Verbal orders given per [facility] protocol  Veronda Prude, RN;

## 2022-10-27 NOTE — Progress Notes (Unsigned)
Subjective:   Arthur [name] is a 82 y.o. male who presents for an Initial Medicare Annual Wellness Visit.  Visit Complete: {[phone]}  Patient Medicare AWV questionnaire was completed by the patient on ***; I have confirmed that all information answered by patient is correct and no changes since this date.        Objective:    There were no vitals filed for this visit. There is no height or weight on file to calculate BMI.     10/17/2022    5:46 PM 10/15/2022   11:28 PM 10/08/2022   10:14 AM 09/06/2022   11:08 AM 08/14/2022    8:55 AM 08/09/2022    5:11 PM 08/09/2022    5:08 PM  Advanced Directives  Does Patient Have a Medical Advance Directive? Yes No No No No  No  Type of Advance Directive Healthcare Power of Attorney        Does patient want to make changes to medical advance directive? No - Patient declined        Copy of Healthcare Power of Attorney in Chart? Yes - validated most recent copy scanned in chart (See row information)        Would patient like information on creating a medical advance directive? No - Patient declined  No - Patient declined No - Patient declined No - Patient declined No - Patient declined     Current Medications (verified) Outpatient Encounter Medications as of 10/28/2022  Medication Sig   alectinib (ALECENSA) 150 MG capsule Take 4 capsules (600 mg total) by mouth 2 (two) times daily with a meal.   cetirizine (ZYRTEC) 10 MG tablet Take 1 tablet (10 mg total) by mouth daily. (Patient not taking: Reported on 10/23/2022)   clopidogrel (PLAVIX) 75 MG tablet Take 1 tablet by mouth once daily with breakfast   enoxaparin (LOVENOX) 80 MG/0.8ML injection Inject 0.7 mLs (70 mg total) into the skin every 12 (twelve) hours.   furosemide (LASIX) 20 MG tablet Take 20 mg by mouth daily as needed for edema (lower extremites). (Patient not taking: Reported on 10/16/2022)   Iron-FA-B Cmp-C-Biot-Probiotic (FUSION PLUS) CAPS Take 1 capsule by mouth  daily.   pantoprazole (PROTONIX) 40 MG tablet Take 1 tablet (40 mg total) by mouth 2 (two) times daily for 23 days. Then daily thereafter forever   polyethylene glycol (MIRALAX / GLYCOLAX) 17 g packet Take 17 g by mouth 2 (two) times daily. (Patient not taking: Reported on 10/23/2022)   rosuvastatin (CRESTOR) 5 MG tablet Take 1 tablet by mouth once daily   senna (SENOKOT) 8.6 MG TABS tablet Take 1 tablet (8.6 mg total) by mouth daily. (Patient not taking: Reported on 10/23/2022)   sodium chloride (OCEAN) 0.65 % SOLN nasal spray Place 1 spray into both nostrils as needed for congestion.   No facility-administered encounter medications on file as of 10/28/2022.    Allergies (verified) Ace inhibitors and Iohexol   History: Past Medical History:  Diagnosis Date   Diabetes mellitus without complication (HCC)    High cholesterol    Hypertension    lung ca    Normal nuclear stress test 01/29/2008   stress perfusion study apparently in 2010 in [facility] which he said was negative.   Past Surgical History:  Procedure Laterality Date   BIOPSY  10/17/2022   Procedure: BIOPSY;  Surgeon: Jenel Lucks, MD;  Location: [facility] ENDOSCOPY;  Service: Gastroenterology;;   CATARACT EXTRACTION, BILATERAL  2006   CORONARY STENT INTERVENTION  N/A 02/06/2021   Procedure: CORONARY STENT INTERVENTION;  Surgeon: Corky Crafts, MD;  Location: [facility] INVASIVE CV LAB;  Service: Cardiovascular;  Laterality: N/A;   CORONARY ULTRASOUND/IVUS N/A 02/06/2021   Procedure: Intravascular Ultrasound/IVUS;  Surgeon: Corky Crafts, MD;  Location: [facility] INVASIVE CV LAB;  Service: Cardiovascular;  Laterality: N/A;   ESOPHAGOGASTRODUODENOSCOPY N/A 10/17/2022   Procedure: ESOPHAGOGASTRODUODENOSCOPY (EGD);  Surgeon: Jenel Lucks, MD;  Location: [facility] ENDOSCOPY;  Service: Gastroenterology;  Laterality: N/A;   FINE NEEDLE ASPIRATION  03/21/2021   Procedure: FINE NEEDLE ASPIRATION;  Surgeon: Josephine Igo, DO;   Location: [facility] ENDOSCOPY;  Service: Pulmonary;;   IR RADIOLOGIST EVAL & MGMT  12/24/2021   KNEE SURGERY  2005   LEFT HEART CATH AND CORONARY ANGIOGRAPHY N/A 02/06/2021   Procedure: LEFT HEART CATH AND CORONARY ANGIOGRAPHY;  Surgeon: Corky Crafts, MD;  Location: [facility] INVASIVE CV LAB;  Service: Cardiovascular;  Laterality: N/A;   VIDEO BRONCHOSCOPY WITH ENDOBRONCHIAL ULTRASOUND Bilateral 03/21/2021   Procedure: VIDEO BRONCHOSCOPY WITH ENDOBRONCHIAL ULTRASOUND;  Surgeon: Josephine Igo, DO;  Location: [facility] ENDOSCOPY;  Service: Pulmonary;  Laterality: Bilateral;   Family History  Problem Relation Age of Onset   Coronary artery disease Father    Coronary artery disease Mother    Social History   Socioeconomic History   Marital status: Married    Spouse name: Not on file   Number of children: Not on file   Years of education: Not on file   Highest education level: Not on file  Occupational History   Not on file  Tobacco Use   Smoking status: Never   Smokeless tobacco: Never  Substance and Sexual Activity   Alcohol use: No   Drug use: No   Sexual activity: Not on file  Other Topics Concern   Not on file  Social History Narrative   Social History:   He works part-time as a Airline pilot. He is married with 3 children. He's never smoked cigarettes and does not drink alcohol.            Social Determinants of Health   Financial Resource Strain: Not on file  Food Insecurity: No Food Insecurity (10/17/2022)   Hunger Vital Sign    Worried About Running Out of Food in the Last Year: Never true    Ran Out of Food in the Last Year: Never true  Transportation Needs: No Transportation Needs (10/17/2022)   PRAPARE - Administrator, Civil Service (Medical): No    Lack of Transportation (Non-Medical): No  Recent Concern: Transportation Needs - Unmet Transportation Needs (08/09/2022)   PRAPARE - Administrator, Civil Service (Medical): Yes    Lack of Transportation  (Non-Medical): Yes  Physical Activity: Not on file  Stress: Not on file  Social Connections: Not on file    Tobacco Counseling Counseling given: Not Answered   Clinical Intake:                        Activities of Daily Living    10/17/2022    5:49 PM 10/17/2022    5:46 PM  In your present state of health, do you have any difficulty performing the following activities:  Hearing? 0 0  Vision? 0 0  Difficulty concentrating or making decisions? 0 0  Walking or climbing stairs? 1 1  Dressing or bathing? 0 0  Doing errands, shopping? 1     Patient Care Team: Carney Living,  MD as PCP - General (Family Medicine) Jodelle Red, MD as PCP - Cardiology (Cardiology) Rollene Rotunda, MD (Cardiology) Nelson Chimes, MD (Ophthalmology)  Indicate any recent Medical Services you may have received from other than [facility] providers in the past year (date may be approximate).     Assessment:   This is a routine wellness examination for Tracie.  Hearing/Vision screen No results found.   Goals Addressed   None    Depression Screen    10/08/2022   10:13 AM 10/03/2022    2:38 PM 10/03/2022    2:37 PM 09/06/2022   11:08 AM 08/14/2022    8:55 AM 12/18/2021    9:35 AM 08/13/2021    9:18 AM  PHQ 2/9 Scores  PHQ - 2 Score   0   0 0  PHQ- 9 Score      0   Exception Documentation Patient refusal Patient refusal  Patient refusal Patient refusal      Fall Risk    10/08/2022   10:13 AM 09/06/2022   11:08 AM 08/14/2022    8:55 AM 04/22/2022    3:42 PM 10/10/2021    9:55 AM  Fall Risk   Falls in the past year? 0 0 0 0 0  Number falls in past yr: 0 0 0  0  Injury with Fall? 0 0 0  0    MEDICARE RISK AT HOME:    TIMED UP AND GO:  Was the test performed? No    Cognitive Function:        Immunizations Immunization History  Administered Date(s) Administered   Fluad Quad(high Dose 65+) 12/18/2021   Influenza Split 12/26/2010, 01/01/2012   Influenza,  High Dose Seasonal PF 12/06/2017   Influenza,inj,Quad PF,6+ Mos 10/20/2012, 12/22/2013, 10/05/2014, 10/09/2015, 11/04/2016, 09/26/2017, 10/27/2018, 11/03/2019, 10/18/2020   PFIZER Comirnaty(Gray Top)Covid-19 Tri-Sucrose Vaccine 06/06/2020   PFIZER(Purple Top)SARS-COV-2 Vaccination 02/19/2019, 03/12/2019, 11/17/2019   Pneumococcal Conjugate-13 11/09/2014   Pneumococcal Polysaccharide-23 01/01/2012    TDAP status: Due, Education has been provided regarding the importance of this vaccine. Advised may receive this vaccine at local pharmacy or Health Dept. Aware to provide a copy of the vaccination record if obtained from local pharmacy or Health Dept. Verbalized acceptance and understanding.  {Flu Vaccine status:[id number]}  Pneumococcal vaccine status: Up to date  Covid-19 vaccine status: Information provided on how to obtain vaccines.   Qualifies for Shingles Vaccine? Yes   Zostavax completed No   Shingrix Completed?: No.    Education has been provided regarding the importance of this vaccine. Patient has been advised to call insurance company to determine out of pocket expense if they have not yet received this vaccine. Advised may also receive vaccine at local pharmacy or Health Dept. Verbalized acceptance and understanding.  Screening Tests Health Maintenance  Topic Date Due   Medicare Annual Wellness (AWV)  Never done   Diabetic kidney evaluation - Urine ACR  Never done   DTaP/Tdap/Td (1 - Tdap) Never done   Zoster Vaccines- Shingrix (1 of 2) Never done   FOOT EXAM  10/27/2019   INFLUENZA VACCINE  08/29/2022   COVID-19 Vaccine (5 - 2023-24 season) 09/29/2022   OPHTHALMOLOGY EXAM  10/12/2022   HEMOGLOBIN A1C  01/30/2023   Diabetic kidney evaluation - eGFR measurement  10/20/2023   Pneumonia Vaccine 59+ Years old  Completed   HPV VACCINES  Aged Out   Colonoscopy  Discontinued   Hepatitis C Screening  Discontinued    Health Maintenance  Health Maintenance Due  Topic Date Due    Medicare Annual Wellness (AWV)  Never done   Diabetic kidney evaluation - Urine ACR  Never done   DTaP/Tdap/Td (1 - Tdap) Never done   Zoster Vaccines- Shingrix (1 of 2) Never done   FOOT EXAM  10/27/2019   INFLUENZA VACCINE  08/29/2022   COVID-19 Vaccine (5 - 2023-24 season) 09/29/2022   OPHTHALMOLOGY EXAM  10/12/2022    Colorectal cancer screening: No longer required.   Lung Cancer Screening: (Low Dose CT Chest recommended if Age 78-80 years, 20 pack-year currently smoking OR have quit w/in 15years.) does not qualify.   Lung Cancer Screening Referral: n/a  Additional Screening:  Hepatitis C Screening: does not qualify  Vision Screening: Recommended annual ophthalmology exams for early detection of glaucoma and other disorders of the eye. Is the patient up to date with their annual eye exam?  {YES/NO:21197} Who is the provider or what is the name of the office in which the patient attends annual eye exams? *** If pt is not established with a provider, would they like to be referred to a provider to establish care? {YES/NO:21197}.   Dental Screening: Recommended annual dental exams for proper oral hygiene  Diabetic Foot Exam: Diabetic Foot Exam: Overdue, Pt has been advised about the importance in completing this exam. Pt is scheduled for diabetic foot exam on at next office visit.  Community Resource Referral / Chronic Care Management: CRR required this visit?  {YES/NO:21197}  CCM required this visit?  {CCM Required choices:[phone]}    Plan:     I have personally reviewed and noted the following in the patient's chart:   Medical and social history Use of alcohol, tobacco or illicit drugs  Current medications and supplements including opioid prescriptions. {Opioid [phone]} Functional ability and status Nutritional status Physical activity Advanced directives List of other physicians Hospitalizations, surgeries, and ER visits in previous 12  months Vitals Screenings to include cognitive, depression, and falls Referrals and appointments  In addition, I have reviewed and discussed with patient certain preventive protocols, quality metrics, and best practice recommendations. A written personalized care plan for preventive services as well as general preventive health recommendations were provided to patient.     Kandis Fantasia [place], [place]   02/27/8655   After Visit Summary: {CHL AMB AWV After Visit Summary:[phone]}  Nurse Notes: ***

## 2022-10-28 ENCOUNTER — Ambulatory Visit: Payer: 59

## 2022-10-28 VITALS — Ht 71.0 in | Wt 157.0 lb

## 2022-10-28 DIAGNOSIS — E785 Hyperlipidemia, unspecified: Secondary | ICD-10-CM | POA: Diagnosis not present

## 2022-10-28 DIAGNOSIS — Z0000 Encounter for general adult medical examination without abnormal findings: Secondary | ICD-10-CM

## 2022-10-28 DIAGNOSIS — K219 Gastro-esophageal reflux disease without esophagitis: Secondary | ICD-10-CM | POA: Diagnosis not present

## 2022-10-28 DIAGNOSIS — M109 Gout, unspecified: Secondary | ICD-10-CM | POA: Diagnosis not present

## 2022-10-28 DIAGNOSIS — E119 Type 2 diabetes mellitus without complications: Secondary | ICD-10-CM | POA: Diagnosis not present

## 2022-10-28 DIAGNOSIS — I10 Essential (primary) hypertension: Secondary | ICD-10-CM | POA: Diagnosis not present

## 2022-10-28 DIAGNOSIS — Z955 Presence of coronary angioplasty implant and graft: Secondary | ICD-10-CM | POA: Diagnosis not present

## 2022-10-28 DIAGNOSIS — R54 Age-related physical debility: Secondary | ICD-10-CM | POA: Diagnosis not present

## 2022-10-28 DIAGNOSIS — Z86718 Personal history of other venous thrombosis and embolism: Secondary | ICD-10-CM | POA: Diagnosis not present

## 2022-10-28 DIAGNOSIS — I2510 Atherosclerotic heart disease of native coronary artery without angina pectoris: Secondary | ICD-10-CM | POA: Diagnosis not present

## 2022-10-28 DIAGNOSIS — D500 Iron deficiency anemia secondary to blood loss (chronic): Secondary | ICD-10-CM | POA: Diagnosis not present

## 2022-10-28 DIAGNOSIS — D630 Anemia in neoplastic disease: Secondary | ICD-10-CM | POA: Diagnosis not present

## 2022-10-28 DIAGNOSIS — C3491 Malignant neoplasm of unspecified part of right bronchus or lung: Secondary | ICD-10-CM | POA: Diagnosis not present

## 2022-10-28 DIAGNOSIS — I480 Paroxysmal atrial fibrillation: Secondary | ICD-10-CM | POA: Diagnosis not present

## 2022-10-28 DIAGNOSIS — Z7902 Long term (current) use of antithrombotics/antiplatelets: Secondary | ICD-10-CM | POA: Diagnosis not present

## 2022-10-28 DIAGNOSIS — K264 Chronic or unspecified duodenal ulcer with hemorrhage: Secondary | ICD-10-CM | POA: Diagnosis not present

## 2022-10-28 DIAGNOSIS — Z8673 Personal history of transient ischemic attack (TIA), and cerebral infarction without residual deficits: Secondary | ICD-10-CM | POA: Diagnosis not present

## 2022-10-28 DIAGNOSIS — I2583 Coronary atherosclerosis due to lipid rich plaque: Secondary | ICD-10-CM | POA: Diagnosis not present

## 2022-10-28 DIAGNOSIS — M21372 Foot drop, left foot: Secondary | ICD-10-CM | POA: Diagnosis not present

## 2022-10-28 DIAGNOSIS — N289 Disorder of kidney and ureter, unspecified: Secondary | ICD-10-CM | POA: Diagnosis not present

## 2022-10-28 DIAGNOSIS — M1990 Unspecified osteoarthritis, unspecified site: Secondary | ICD-10-CM | POA: Diagnosis not present

## 2022-10-28 DIAGNOSIS — K5900 Constipation, unspecified: Secondary | ICD-10-CM | POA: Diagnosis not present

## 2022-10-28 DIAGNOSIS — Z86711 Personal history of pulmonary embolism: Secondary | ICD-10-CM | POA: Diagnosis not present

## 2022-10-28 NOTE — Patient Instructions (Addendum)
Arthur [name] , Thank you for taking time to come for your Medicare Wellness Visit. I appreciate your ongoing commitment to your health goals. Please review the following plan we discussed and let me know if I can assist you in the future.   Referrals/Orders/Follow-Ups/Clinician Recommendations: Aim for 30 minutes of exercise or brisk walking, 6-8 glasses of water, and 5 servings of fruits and vegetables each day.  This is a list of the screening recommended for you and due dates:  Health Maintenance  Topic Date Due   Yearly kidney health urinalysis for diabetes  Never done   DTaP/Tdap/Td vaccine (1 - Tdap) Never done   Zoster (Shingles) Vaccine (1 of 2) Never done   Complete foot exam   10/27/2019   Flu Shot  08/29/2022   COVID-19 Vaccine (5 - 2023-24 season) 09/29/2022   Eye exam for diabetics  10/12/2022   Hemoglobin A1C  01/30/2023   Yearly kidney function blood test for diabetes  10/20/2023   Medicare Annual Wellness Visit  10/28/2023   Pneumonia Vaccine  Completed   HPV Vaccine  Aged Out   Colon Cancer Screening  Discontinued   Hepatitis C Screening  Discontinued    Advanced directives: (In Chart) A copy of your advanced directives are scanned into your chart should your provider ever need it.  Next Medicare Annual Wellness Visit scheduled for next year: Yes

## 2022-10-29 ENCOUNTER — Other Ambulatory Visit: Payer: Self-pay

## 2022-10-30 DIAGNOSIS — K264 Chronic or unspecified duodenal ulcer with hemorrhage: Secondary | ICD-10-CM | POA: Diagnosis not present

## 2022-10-30 DIAGNOSIS — I480 Paroxysmal atrial fibrillation: Secondary | ICD-10-CM | POA: Diagnosis not present

## 2022-10-30 DIAGNOSIS — M1990 Unspecified osteoarthritis, unspecified site: Secondary | ICD-10-CM | POA: Diagnosis not present

## 2022-10-30 DIAGNOSIS — I10 Essential (primary) hypertension: Secondary | ICD-10-CM | POA: Diagnosis not present

## 2022-10-30 DIAGNOSIS — M21372 Foot drop, left foot: Secondary | ICD-10-CM | POA: Diagnosis not present

## 2022-10-30 DIAGNOSIS — D630 Anemia in neoplastic disease: Secondary | ICD-10-CM | POA: Diagnosis not present

## 2022-10-30 DIAGNOSIS — I2583 Coronary atherosclerosis due to lipid rich plaque: Secondary | ICD-10-CM | POA: Diagnosis not present

## 2022-10-30 DIAGNOSIS — D500 Iron deficiency anemia secondary to blood loss (chronic): Secondary | ICD-10-CM | POA: Diagnosis not present

## 2022-10-30 DIAGNOSIS — Z7902 Long term (current) use of antithrombotics/antiplatelets: Secondary | ICD-10-CM | POA: Diagnosis not present

## 2022-10-30 DIAGNOSIS — N289 Disorder of kidney and ureter, unspecified: Secondary | ICD-10-CM | POA: Diagnosis not present

## 2022-10-30 DIAGNOSIS — E785 Hyperlipidemia, unspecified: Secondary | ICD-10-CM | POA: Diagnosis not present

## 2022-10-30 DIAGNOSIS — C3491 Malignant neoplasm of unspecified part of right bronchus or lung: Secondary | ICD-10-CM | POA: Diagnosis not present

## 2022-10-30 DIAGNOSIS — Z86718 Personal history of other venous thrombosis and embolism: Secondary | ICD-10-CM | POA: Diagnosis not present

## 2022-10-30 DIAGNOSIS — Z8673 Personal history of transient ischemic attack (TIA), and cerebral infarction without residual deficits: Secondary | ICD-10-CM | POA: Diagnosis not present

## 2022-10-30 DIAGNOSIS — Z86711 Personal history of pulmonary embolism: Secondary | ICD-10-CM | POA: Diagnosis not present

## 2022-10-30 DIAGNOSIS — R54 Age-related physical debility: Secondary | ICD-10-CM | POA: Diagnosis not present

## 2022-10-30 DIAGNOSIS — K219 Gastro-esophageal reflux disease without esophagitis: Secondary | ICD-10-CM | POA: Diagnosis not present

## 2022-10-30 DIAGNOSIS — E119 Type 2 diabetes mellitus without complications: Secondary | ICD-10-CM | POA: Diagnosis not present

## 2022-10-30 DIAGNOSIS — I2510 Atherosclerotic heart disease of native coronary artery without angina pectoris: Secondary | ICD-10-CM | POA: Diagnosis not present

## 2022-10-30 DIAGNOSIS — Z955 Presence of coronary angioplasty implant and graft: Secondary | ICD-10-CM | POA: Diagnosis not present

## 2022-10-30 DIAGNOSIS — K5900 Constipation, unspecified: Secondary | ICD-10-CM | POA: Diagnosis not present

## 2022-10-30 DIAGNOSIS — M109 Gout, unspecified: Secondary | ICD-10-CM | POA: Diagnosis not present

## 2022-11-01 ENCOUNTER — Other Ambulatory Visit (HOSPITAL_COMMUNITY): Payer: Self-pay

## 2022-11-01 ENCOUNTER — Other Ambulatory Visit: Payer: Self-pay

## 2022-11-01 DIAGNOSIS — Z8673 Personal history of transient ischemic attack (TIA), and cerebral infarction without residual deficits: Secondary | ICD-10-CM | POA: Diagnosis not present

## 2022-11-01 DIAGNOSIS — I2583 Coronary atherosclerosis due to lipid rich plaque: Secondary | ICD-10-CM | POA: Diagnosis not present

## 2022-11-01 DIAGNOSIS — D500 Iron deficiency anemia secondary to blood loss (chronic): Secondary | ICD-10-CM | POA: Diagnosis not present

## 2022-11-01 DIAGNOSIS — I2510 Atherosclerotic heart disease of native coronary artery without angina pectoris: Secondary | ICD-10-CM | POA: Diagnosis not present

## 2022-11-01 DIAGNOSIS — Z955 Presence of coronary angioplasty implant and graft: Secondary | ICD-10-CM | POA: Diagnosis not present

## 2022-11-01 DIAGNOSIS — I480 Paroxysmal atrial fibrillation: Secondary | ICD-10-CM | POA: Diagnosis not present

## 2022-11-01 DIAGNOSIS — D630 Anemia in neoplastic disease: Secondary | ICD-10-CM | POA: Diagnosis not present

## 2022-11-01 DIAGNOSIS — R54 Age-related physical debility: Secondary | ICD-10-CM | POA: Diagnosis not present

## 2022-11-01 DIAGNOSIS — Z86711 Personal history of pulmonary embolism: Secondary | ICD-10-CM | POA: Diagnosis not present

## 2022-11-01 DIAGNOSIS — K219 Gastro-esophageal reflux disease without esophagitis: Secondary | ICD-10-CM | POA: Diagnosis not present

## 2022-11-01 DIAGNOSIS — N289 Disorder of kidney and ureter, unspecified: Secondary | ICD-10-CM | POA: Diagnosis not present

## 2022-11-01 DIAGNOSIS — M21372 Foot drop, left foot: Secondary | ICD-10-CM | POA: Diagnosis not present

## 2022-11-01 DIAGNOSIS — C3491 Malignant neoplasm of unspecified part of right bronchus or lung: Secondary | ICD-10-CM | POA: Diagnosis not present

## 2022-11-01 DIAGNOSIS — E119 Type 2 diabetes mellitus without complications: Secondary | ICD-10-CM | POA: Diagnosis not present

## 2022-11-01 DIAGNOSIS — I10 Essential (primary) hypertension: Secondary | ICD-10-CM | POA: Diagnosis not present

## 2022-11-01 DIAGNOSIS — K264 Chronic or unspecified duodenal ulcer with hemorrhage: Secondary | ICD-10-CM | POA: Diagnosis not present

## 2022-11-01 DIAGNOSIS — Z7902 Long term (current) use of antithrombotics/antiplatelets: Secondary | ICD-10-CM | POA: Diagnosis not present

## 2022-11-01 DIAGNOSIS — E785 Hyperlipidemia, unspecified: Secondary | ICD-10-CM | POA: Diagnosis not present

## 2022-11-01 DIAGNOSIS — M1990 Unspecified osteoarthritis, unspecified site: Secondary | ICD-10-CM | POA: Diagnosis not present

## 2022-11-01 DIAGNOSIS — Z86718 Personal history of other venous thrombosis and embolism: Secondary | ICD-10-CM | POA: Diagnosis not present

## 2022-11-01 DIAGNOSIS — K5900 Constipation, unspecified: Secondary | ICD-10-CM | POA: Diagnosis not present

## 2022-11-01 DIAGNOSIS — M109 Gout, unspecified: Secondary | ICD-10-CM | POA: Diagnosis not present

## 2022-11-01 NOTE — Progress Notes (Signed)
Specialty Pharmacy Refill Coordination Note  Arthur [name] is a 82 y.o. male contacted today regarding refills of specialty medication(s) Alectinib Hcl   Patient requested Delivery   Delivery date: 11/05/22   Verified address: [address] [name] [address]   Medication will be filled on 11/04/22.

## 2022-11-04 DIAGNOSIS — I10 Essential (primary) hypertension: Secondary | ICD-10-CM | POA: Diagnosis not present

## 2022-11-04 DIAGNOSIS — D500 Iron deficiency anemia secondary to blood loss (chronic): Secondary | ICD-10-CM | POA: Diagnosis not present

## 2022-11-04 DIAGNOSIS — M1990 Unspecified osteoarthritis, unspecified site: Secondary | ICD-10-CM | POA: Diagnosis not present

## 2022-11-04 DIAGNOSIS — M109 Gout, unspecified: Secondary | ICD-10-CM | POA: Diagnosis not present

## 2022-11-04 DIAGNOSIS — N289 Disorder of kidney and ureter, unspecified: Secondary | ICD-10-CM | POA: Diagnosis not present

## 2022-11-04 DIAGNOSIS — K219 Gastro-esophageal reflux disease without esophagitis: Secondary | ICD-10-CM | POA: Diagnosis not present

## 2022-11-04 DIAGNOSIS — Z86718 Personal history of other venous thrombosis and embolism: Secondary | ICD-10-CM | POA: Diagnosis not present

## 2022-11-04 DIAGNOSIS — K264 Chronic or unspecified duodenal ulcer with hemorrhage: Secondary | ICD-10-CM | POA: Diagnosis not present

## 2022-11-04 DIAGNOSIS — E785 Hyperlipidemia, unspecified: Secondary | ICD-10-CM | POA: Diagnosis not present

## 2022-11-04 DIAGNOSIS — M21372 Foot drop, left foot: Secondary | ICD-10-CM | POA: Diagnosis not present

## 2022-11-04 DIAGNOSIS — Z7902 Long term (current) use of antithrombotics/antiplatelets: Secondary | ICD-10-CM | POA: Diagnosis not present

## 2022-11-04 DIAGNOSIS — C3491 Malignant neoplasm of unspecified part of right bronchus or lung: Secondary | ICD-10-CM | POA: Diagnosis not present

## 2022-11-04 DIAGNOSIS — E119 Type 2 diabetes mellitus without complications: Secondary | ICD-10-CM | POA: Diagnosis not present

## 2022-11-04 DIAGNOSIS — Z8673 Personal history of transient ischemic attack (TIA), and cerebral infarction without residual deficits: Secondary | ICD-10-CM | POA: Diagnosis not present

## 2022-11-04 DIAGNOSIS — I2510 Atherosclerotic heart disease of native coronary artery without angina pectoris: Secondary | ICD-10-CM | POA: Diagnosis not present

## 2022-11-04 DIAGNOSIS — Z955 Presence of coronary angioplasty implant and graft: Secondary | ICD-10-CM | POA: Diagnosis not present

## 2022-11-04 DIAGNOSIS — K5900 Constipation, unspecified: Secondary | ICD-10-CM | POA: Diagnosis not present

## 2022-11-04 DIAGNOSIS — Z86711 Personal history of pulmonary embolism: Secondary | ICD-10-CM | POA: Diagnosis not present

## 2022-11-04 DIAGNOSIS — I480 Paroxysmal atrial fibrillation: Secondary | ICD-10-CM | POA: Diagnosis not present

## 2022-11-04 DIAGNOSIS — D630 Anemia in neoplastic disease: Secondary | ICD-10-CM | POA: Diagnosis not present

## 2022-11-04 DIAGNOSIS — R54 Age-related physical debility: Secondary | ICD-10-CM | POA: Diagnosis not present

## 2022-11-04 DIAGNOSIS — I2583 Coronary atherosclerosis due to lipid rich plaque: Secondary | ICD-10-CM | POA: Diagnosis not present

## 2022-11-05 DIAGNOSIS — M1990 Unspecified osteoarthritis, unspecified site: Secondary | ICD-10-CM | POA: Diagnosis not present

## 2022-11-05 DIAGNOSIS — Z86718 Personal history of other venous thrombosis and embolism: Secondary | ICD-10-CM | POA: Diagnosis not present

## 2022-11-05 DIAGNOSIS — N289 Disorder of kidney and ureter, unspecified: Secondary | ICD-10-CM | POA: Diagnosis not present

## 2022-11-05 DIAGNOSIS — Z8673 Personal history of transient ischemic attack (TIA), and cerebral infarction without residual deficits: Secondary | ICD-10-CM | POA: Diagnosis not present

## 2022-11-05 DIAGNOSIS — I480 Paroxysmal atrial fibrillation: Secondary | ICD-10-CM | POA: Diagnosis not present

## 2022-11-05 DIAGNOSIS — M109 Gout, unspecified: Secondary | ICD-10-CM | POA: Diagnosis not present

## 2022-11-05 DIAGNOSIS — I2510 Atherosclerotic heart disease of native coronary artery without angina pectoris: Secondary | ICD-10-CM | POA: Diagnosis not present

## 2022-11-05 DIAGNOSIS — M21372 Foot drop, left foot: Secondary | ICD-10-CM | POA: Diagnosis not present

## 2022-11-05 DIAGNOSIS — K219 Gastro-esophageal reflux disease without esophagitis: Secondary | ICD-10-CM | POA: Diagnosis not present

## 2022-11-05 DIAGNOSIS — Z86711 Personal history of pulmonary embolism: Secondary | ICD-10-CM | POA: Diagnosis not present

## 2022-11-05 DIAGNOSIS — C3491 Malignant neoplasm of unspecified part of right bronchus or lung: Secondary | ICD-10-CM | POA: Diagnosis not present

## 2022-11-05 DIAGNOSIS — I10 Essential (primary) hypertension: Secondary | ICD-10-CM | POA: Diagnosis not present

## 2022-11-05 DIAGNOSIS — K264 Chronic or unspecified duodenal ulcer with hemorrhage: Secondary | ICD-10-CM | POA: Diagnosis not present

## 2022-11-05 DIAGNOSIS — D500 Iron deficiency anemia secondary to blood loss (chronic): Secondary | ICD-10-CM | POA: Diagnosis not present

## 2022-11-05 DIAGNOSIS — E785 Hyperlipidemia, unspecified: Secondary | ICD-10-CM | POA: Diagnosis not present

## 2022-11-05 DIAGNOSIS — Z955 Presence of coronary angioplasty implant and graft: Secondary | ICD-10-CM | POA: Diagnosis not present

## 2022-11-05 DIAGNOSIS — E119 Type 2 diabetes mellitus without complications: Secondary | ICD-10-CM | POA: Diagnosis not present

## 2022-11-05 DIAGNOSIS — D630 Anemia in neoplastic disease: Secondary | ICD-10-CM | POA: Diagnosis not present

## 2022-11-05 DIAGNOSIS — Z7902 Long term (current) use of antithrombotics/antiplatelets: Secondary | ICD-10-CM | POA: Diagnosis not present

## 2022-11-05 DIAGNOSIS — I2583 Coronary atherosclerosis due to lipid rich plaque: Secondary | ICD-10-CM | POA: Diagnosis not present

## 2022-11-05 DIAGNOSIS — K5900 Constipation, unspecified: Secondary | ICD-10-CM | POA: Diagnosis not present

## 2022-11-05 DIAGNOSIS — R54 Age-related physical debility: Secondary | ICD-10-CM | POA: Diagnosis not present

## 2022-11-09 DIAGNOSIS — I2583 Coronary atherosclerosis due to lipid rich plaque: Secondary | ICD-10-CM | POA: Diagnosis not present

## 2022-11-09 DIAGNOSIS — D500 Iron deficiency anemia secondary to blood loss (chronic): Secondary | ICD-10-CM | POA: Diagnosis not present

## 2022-11-09 DIAGNOSIS — Z86711 Personal history of pulmonary embolism: Secondary | ICD-10-CM | POA: Diagnosis not present

## 2022-11-09 DIAGNOSIS — K219 Gastro-esophageal reflux disease without esophagitis: Secondary | ICD-10-CM | POA: Diagnosis not present

## 2022-11-09 DIAGNOSIS — Z86718 Personal history of other venous thrombosis and embolism: Secondary | ICD-10-CM | POA: Diagnosis not present

## 2022-11-09 DIAGNOSIS — M109 Gout, unspecified: Secondary | ICD-10-CM | POA: Diagnosis not present

## 2022-11-09 DIAGNOSIS — E119 Type 2 diabetes mellitus without complications: Secondary | ICD-10-CM | POA: Diagnosis not present

## 2022-11-09 DIAGNOSIS — Z7902 Long term (current) use of antithrombotics/antiplatelets: Secondary | ICD-10-CM | POA: Diagnosis not present

## 2022-11-09 DIAGNOSIS — R54 Age-related physical debility: Secondary | ICD-10-CM | POA: Diagnosis not present

## 2022-11-09 DIAGNOSIS — C3491 Malignant neoplasm of unspecified part of right bronchus or lung: Secondary | ICD-10-CM | POA: Diagnosis not present

## 2022-11-09 DIAGNOSIS — I2510 Atherosclerotic heart disease of native coronary artery without angina pectoris: Secondary | ICD-10-CM | POA: Diagnosis not present

## 2022-11-09 DIAGNOSIS — I10 Essential (primary) hypertension: Secondary | ICD-10-CM | POA: Diagnosis not present

## 2022-11-09 DIAGNOSIS — M1990 Unspecified osteoarthritis, unspecified site: Secondary | ICD-10-CM | POA: Diagnosis not present

## 2022-11-09 DIAGNOSIS — Z8673 Personal history of transient ischemic attack (TIA), and cerebral infarction without residual deficits: Secondary | ICD-10-CM | POA: Diagnosis not present

## 2022-11-09 DIAGNOSIS — K264 Chronic or unspecified duodenal ulcer with hemorrhage: Secondary | ICD-10-CM | POA: Diagnosis not present

## 2022-11-09 DIAGNOSIS — K5900 Constipation, unspecified: Secondary | ICD-10-CM | POA: Diagnosis not present

## 2022-11-09 DIAGNOSIS — E785 Hyperlipidemia, unspecified: Secondary | ICD-10-CM | POA: Diagnosis not present

## 2022-11-09 DIAGNOSIS — N289 Disorder of kidney and ureter, unspecified: Secondary | ICD-10-CM | POA: Diagnosis not present

## 2022-11-09 DIAGNOSIS — Z955 Presence of coronary angioplasty implant and graft: Secondary | ICD-10-CM | POA: Diagnosis not present

## 2022-11-09 DIAGNOSIS — M21372 Foot drop, left foot: Secondary | ICD-10-CM | POA: Diagnosis not present

## 2022-11-09 DIAGNOSIS — I480 Paroxysmal atrial fibrillation: Secondary | ICD-10-CM | POA: Diagnosis not present

## 2022-11-09 DIAGNOSIS — D630 Anemia in neoplastic disease: Secondary | ICD-10-CM | POA: Diagnosis not present

## 2022-11-12 DIAGNOSIS — Z955 Presence of coronary angioplasty implant and graft: Secondary | ICD-10-CM | POA: Diagnosis not present

## 2022-11-12 DIAGNOSIS — K219 Gastro-esophageal reflux disease without esophagitis: Secondary | ICD-10-CM | POA: Diagnosis not present

## 2022-11-12 DIAGNOSIS — N289 Disorder of kidney and ureter, unspecified: Secondary | ICD-10-CM | POA: Diagnosis not present

## 2022-11-12 DIAGNOSIS — Z86711 Personal history of pulmonary embolism: Secondary | ICD-10-CM | POA: Diagnosis not present

## 2022-11-12 DIAGNOSIS — D500 Iron deficiency anemia secondary to blood loss (chronic): Secondary | ICD-10-CM | POA: Diagnosis not present

## 2022-11-12 DIAGNOSIS — E785 Hyperlipidemia, unspecified: Secondary | ICD-10-CM | POA: Diagnosis not present

## 2022-11-12 DIAGNOSIS — Z8673 Personal history of transient ischemic attack (TIA), and cerebral infarction without residual deficits: Secondary | ICD-10-CM | POA: Diagnosis not present

## 2022-11-12 DIAGNOSIS — R54 Age-related physical debility: Secondary | ICD-10-CM | POA: Diagnosis not present

## 2022-11-12 DIAGNOSIS — E119 Type 2 diabetes mellitus without complications: Secondary | ICD-10-CM | POA: Diagnosis not present

## 2022-11-12 DIAGNOSIS — C3491 Malignant neoplasm of unspecified part of right bronchus or lung: Secondary | ICD-10-CM | POA: Diagnosis not present

## 2022-11-12 DIAGNOSIS — D630 Anemia in neoplastic disease: Secondary | ICD-10-CM | POA: Diagnosis not present

## 2022-11-12 DIAGNOSIS — I10 Essential (primary) hypertension: Secondary | ICD-10-CM | POA: Diagnosis not present

## 2022-11-12 DIAGNOSIS — I2583 Coronary atherosclerosis due to lipid rich plaque: Secondary | ICD-10-CM | POA: Diagnosis not present

## 2022-11-12 DIAGNOSIS — M1990 Unspecified osteoarthritis, unspecified site: Secondary | ICD-10-CM | POA: Diagnosis not present

## 2022-11-12 DIAGNOSIS — K264 Chronic or unspecified duodenal ulcer with hemorrhage: Secondary | ICD-10-CM | POA: Diagnosis not present

## 2022-11-12 DIAGNOSIS — M109 Gout, unspecified: Secondary | ICD-10-CM | POA: Diagnosis not present

## 2022-11-12 DIAGNOSIS — K5900 Constipation, unspecified: Secondary | ICD-10-CM | POA: Diagnosis not present

## 2022-11-12 DIAGNOSIS — I2510 Atherosclerotic heart disease of native coronary artery without angina pectoris: Secondary | ICD-10-CM | POA: Diagnosis not present

## 2022-11-12 DIAGNOSIS — Z7902 Long term (current) use of antithrombotics/antiplatelets: Secondary | ICD-10-CM | POA: Diagnosis not present

## 2022-11-12 DIAGNOSIS — Z86718 Personal history of other venous thrombosis and embolism: Secondary | ICD-10-CM | POA: Diagnosis not present

## 2022-11-12 DIAGNOSIS — I480 Paroxysmal atrial fibrillation: Secondary | ICD-10-CM | POA: Diagnosis not present

## 2022-11-12 DIAGNOSIS — M21372 Foot drop, left foot: Secondary | ICD-10-CM | POA: Diagnosis not present

## 2022-11-13 DIAGNOSIS — E119 Type 2 diabetes mellitus without complications: Secondary | ICD-10-CM | POA: Diagnosis not present

## 2022-11-13 DIAGNOSIS — Z7902 Long term (current) use of antithrombotics/antiplatelets: Secondary | ICD-10-CM | POA: Diagnosis not present

## 2022-11-13 DIAGNOSIS — R54 Age-related physical debility: Secondary | ICD-10-CM | POA: Diagnosis not present

## 2022-11-13 DIAGNOSIS — C3491 Malignant neoplasm of unspecified part of right bronchus or lung: Secondary | ICD-10-CM | POA: Diagnosis not present

## 2022-11-13 DIAGNOSIS — Z8673 Personal history of transient ischemic attack (TIA), and cerebral infarction without residual deficits: Secondary | ICD-10-CM | POA: Diagnosis not present

## 2022-11-13 DIAGNOSIS — Z86711 Personal history of pulmonary embolism: Secondary | ICD-10-CM | POA: Diagnosis not present

## 2022-11-13 DIAGNOSIS — I2583 Coronary atherosclerosis due to lipid rich plaque: Secondary | ICD-10-CM | POA: Diagnosis not present

## 2022-11-13 DIAGNOSIS — Z86718 Personal history of other venous thrombosis and embolism: Secondary | ICD-10-CM | POA: Diagnosis not present

## 2022-11-13 DIAGNOSIS — K264 Chronic or unspecified duodenal ulcer with hemorrhage: Secondary | ICD-10-CM | POA: Diagnosis not present

## 2022-11-13 DIAGNOSIS — D630 Anemia in neoplastic disease: Secondary | ICD-10-CM | POA: Diagnosis not present

## 2022-11-13 DIAGNOSIS — M109 Gout, unspecified: Secondary | ICD-10-CM | POA: Diagnosis not present

## 2022-11-13 DIAGNOSIS — E785 Hyperlipidemia, unspecified: Secondary | ICD-10-CM | POA: Diagnosis not present

## 2022-11-13 DIAGNOSIS — Z955 Presence of coronary angioplasty implant and graft: Secondary | ICD-10-CM | POA: Diagnosis not present

## 2022-11-13 DIAGNOSIS — I2510 Atherosclerotic heart disease of native coronary artery without angina pectoris: Secondary | ICD-10-CM | POA: Diagnosis not present

## 2022-11-13 DIAGNOSIS — I480 Paroxysmal atrial fibrillation: Secondary | ICD-10-CM | POA: Diagnosis not present

## 2022-11-13 DIAGNOSIS — I10 Essential (primary) hypertension: Secondary | ICD-10-CM | POA: Diagnosis not present

## 2022-11-13 DIAGNOSIS — M21372 Foot drop, left foot: Secondary | ICD-10-CM | POA: Diagnosis not present

## 2022-11-13 DIAGNOSIS — D500 Iron deficiency anemia secondary to blood loss (chronic): Secondary | ICD-10-CM | POA: Diagnosis not present

## 2022-11-13 DIAGNOSIS — K219 Gastro-esophageal reflux disease without esophagitis: Secondary | ICD-10-CM | POA: Diagnosis not present

## 2022-11-13 DIAGNOSIS — M1990 Unspecified osteoarthritis, unspecified site: Secondary | ICD-10-CM | POA: Diagnosis not present

## 2022-11-13 DIAGNOSIS — K5900 Constipation, unspecified: Secondary | ICD-10-CM | POA: Diagnosis not present

## 2022-11-13 DIAGNOSIS — N289 Disorder of kidney and ureter, unspecified: Secondary | ICD-10-CM | POA: Diagnosis not present

## 2022-11-14 DIAGNOSIS — M109 Gout, unspecified: Secondary | ICD-10-CM | POA: Diagnosis not present

## 2022-11-14 DIAGNOSIS — M21372 Foot drop, left foot: Secondary | ICD-10-CM | POA: Diagnosis not present

## 2022-11-14 DIAGNOSIS — C3491 Malignant neoplasm of unspecified part of right bronchus or lung: Secondary | ICD-10-CM | POA: Diagnosis not present

## 2022-11-14 DIAGNOSIS — I2510 Atherosclerotic heart disease of native coronary artery without angina pectoris: Secondary | ICD-10-CM | POA: Diagnosis not present

## 2022-11-14 DIAGNOSIS — E785 Hyperlipidemia, unspecified: Secondary | ICD-10-CM | POA: Diagnosis not present

## 2022-11-14 DIAGNOSIS — I480 Paroxysmal atrial fibrillation: Secondary | ICD-10-CM | POA: Diagnosis not present

## 2022-11-14 DIAGNOSIS — R54 Age-related physical debility: Secondary | ICD-10-CM | POA: Diagnosis not present

## 2022-11-14 DIAGNOSIS — Z8673 Personal history of transient ischemic attack (TIA), and cerebral infarction without residual deficits: Secondary | ICD-10-CM | POA: Diagnosis not present

## 2022-11-14 DIAGNOSIS — E119 Type 2 diabetes mellitus without complications: Secondary | ICD-10-CM | POA: Diagnosis not present

## 2022-11-14 DIAGNOSIS — D500 Iron deficiency anemia secondary to blood loss (chronic): Secondary | ICD-10-CM | POA: Diagnosis not present

## 2022-11-14 DIAGNOSIS — Z955 Presence of coronary angioplasty implant and graft: Secondary | ICD-10-CM | POA: Diagnosis not present

## 2022-11-14 DIAGNOSIS — K5900 Constipation, unspecified: Secondary | ICD-10-CM | POA: Diagnosis not present

## 2022-11-14 DIAGNOSIS — I2583 Coronary atherosclerosis due to lipid rich plaque: Secondary | ICD-10-CM | POA: Diagnosis not present

## 2022-11-14 DIAGNOSIS — Z86711 Personal history of pulmonary embolism: Secondary | ICD-10-CM | POA: Diagnosis not present

## 2022-11-14 DIAGNOSIS — Z86718 Personal history of other venous thrombosis and embolism: Secondary | ICD-10-CM | POA: Diagnosis not present

## 2022-11-14 DIAGNOSIS — K264 Chronic or unspecified duodenal ulcer with hemorrhage: Secondary | ICD-10-CM | POA: Diagnosis not present

## 2022-11-14 DIAGNOSIS — D630 Anemia in neoplastic disease: Secondary | ICD-10-CM | POA: Diagnosis not present

## 2022-11-14 DIAGNOSIS — M1990 Unspecified osteoarthritis, unspecified site: Secondary | ICD-10-CM | POA: Diagnosis not present

## 2022-11-14 DIAGNOSIS — Z7902 Long term (current) use of antithrombotics/antiplatelets: Secondary | ICD-10-CM | POA: Diagnosis not present

## 2022-11-14 DIAGNOSIS — K219 Gastro-esophageal reflux disease without esophagitis: Secondary | ICD-10-CM | POA: Diagnosis not present

## 2022-11-14 DIAGNOSIS — I10 Essential (primary) hypertension: Secondary | ICD-10-CM | POA: Diagnosis not present

## 2022-11-14 DIAGNOSIS — N289 Disorder of kidney and ureter, unspecified: Secondary | ICD-10-CM | POA: Diagnosis not present

## 2022-11-16 ENCOUNTER — Other Ambulatory Visit: Payer: Self-pay

## 2022-11-18 ENCOUNTER — Other Ambulatory Visit: Payer: Self-pay

## 2022-11-18 ENCOUNTER — Other Ambulatory Visit (HOSPITAL_COMMUNITY): Payer: Self-pay

## 2022-11-19 ENCOUNTER — Ambulatory Visit (HOSPITAL_BASED_OUTPATIENT_CLINIC_OR_DEPARTMENT_OTHER): Payer: 59 | Admitting: Cardiology

## 2022-11-19 ENCOUNTER — Other Ambulatory Visit (HOSPITAL_COMMUNITY): Payer: Self-pay

## 2022-11-20 ENCOUNTER — Other Ambulatory Visit: Payer: Self-pay

## 2022-11-20 ENCOUNTER — Inpatient Hospital Stay (HOSPITAL_BASED_OUTPATIENT_CLINIC_OR_DEPARTMENT_OTHER): Payer: 59 | Admitting: Internal Medicine

## 2022-11-20 ENCOUNTER — Inpatient Hospital Stay: Payer: 59 | Attending: Physician Assistant

## 2022-11-20 VITALS — BP 139/80 | HR 92 | Temp 98.0°F | Resp 16 | Ht 71.0 in | Wt 159.4 lb

## 2022-11-20 DIAGNOSIS — C3490 Malignant neoplasm of unspecified part of unspecified bronchus or lung: Secondary | ICD-10-CM

## 2022-11-20 DIAGNOSIS — Z7901 Long term (current) use of anticoagulants: Secondary | ICD-10-CM | POA: Diagnosis not present

## 2022-11-20 DIAGNOSIS — R634 Abnormal weight loss: Secondary | ICD-10-CM | POA: Diagnosis not present

## 2022-11-20 DIAGNOSIS — Z79899 Other long term (current) drug therapy: Secondary | ICD-10-CM | POA: Diagnosis not present

## 2022-11-20 DIAGNOSIS — C3491 Malignant neoplasm of unspecified part of right bronchus or lung: Secondary | ICD-10-CM

## 2022-11-20 DIAGNOSIS — D649 Anemia, unspecified: Secondary | ICD-10-CM

## 2022-11-20 DIAGNOSIS — K922 Gastrointestinal hemorrhage, unspecified: Secondary | ICD-10-CM | POA: Diagnosis not present

## 2022-11-20 LAB — CMP (CANCER CENTER ONLY)
ALT: 13 U/L (ref 0–44)
AST: 24 U/L (ref 15–41)
Albumin: 3 g/dL — ABNORMAL LOW (ref 3.5–5.0)
Alkaline Phosphatase: 79 U/L (ref 38–126)
Anion gap: 3 — ABNORMAL LOW (ref 5–15)
BUN: 18 mg/dL (ref 8–23)
CO2: 30 mmol/L (ref 22–32)
Calcium: 8.2 mg/dL — ABNORMAL LOW (ref 8.9–10.3)
Chloride: 107 mmol/L (ref 98–111)
Creatinine: 1.44 mg/dL — ABNORMAL HIGH (ref 0.61–1.24)
GFR, Estimated: 49 mL/min — ABNORMAL LOW (ref >60)
Glucose, Bld: 145 mg/dL — ABNORMAL HIGH (ref 70–99)
Potassium: 4 mmol/L (ref 3.5–5.1)
Sodium: 140 mmol/L (ref 135–145)
Total Bilirubin: 1.3 mg/dL — ABNORMAL HIGH (ref 0.3–1.2)
Total Protein: 6.4 g/dL — ABNORMAL LOW (ref 6.5–8.1)

## 2022-11-20 LAB — CBC WITH DIFFERENTIAL (CANCER CENTER ONLY)
Abs Immature Granulocytes: 0.07 10*3/uL (ref 0.00–0.07)
Basophils Absolute: 0.1 10*3/uL (ref 0.0–0.1)
Basophils Relative: 1 %
Eosinophils Absolute: 0.2 10*3/uL (ref 0.0–0.5)
Eosinophils Relative: 3 %
HCT: 28.5 % — ABNORMAL LOW (ref 39.0–52.0)
Hemoglobin: 9.4 g/dL — ABNORMAL LOW (ref 13.0–17.0)
Immature Granulocytes: 1 %
Lymphocytes Relative: 27 %
Lymphs Abs: 1.7 10*3/uL (ref 0.7–4.0)
MCH: 28.9 pg (ref 26.0–34.0)
MCHC: 33 g/dL (ref 30.0–36.0)
MCV: 87.7 fL (ref 80.0–100.0)
Monocytes Absolute: 0.6 10*3/uL (ref 0.1–1.0)
Monocytes Relative: 10 %
Neutro Abs: 3.7 10*3/uL (ref 1.7–7.7)
Neutrophils Relative %: 58 %
Platelet Count: 311 10*3/uL (ref 150–400)
RBC: 3.25 MIL/uL — ABNORMAL LOW (ref 4.22–5.81)
RDW: 17.3 % — ABNORMAL HIGH (ref 11.5–15.5)
WBC Count: 6.4 10*3/uL (ref 4.0–10.5)
nRBC: 0 % (ref 0.0–0.2)

## 2022-11-20 LAB — IRON AND IRON BINDING CAPACITY (CC-WL,HP ONLY)
Iron: 102 ug/dL (ref 45–182)
Saturation Ratios: 42 % — ABNORMAL HIGH (ref 17.9–39.5)
TIBC: 245 ug/dL — ABNORMAL LOW (ref 250–450)
UIBC: 143 ug/dL

## 2022-11-20 LAB — SAMPLE TO BLOOD BANK

## 2022-11-20 LAB — FERRITIN: Ferritin: 254 ng/mL (ref 24–336)

## 2022-11-20 NOTE — Progress Notes (Signed)
[facility] [phone]   [phone]  OFFICE PROGRESS NOTE  Carney Living, MD [address] [place] [address]  DIAGNOSIS:  1) Stage IV (T1b, N3, M1 C) non-small cell lung cancer, adenocarcinoma with positive ALK gene translocation diagnosed in February 2023. 2) deep venous thrombosis of the left lower extremity  PRIOR THERAPY: None  CURRENT THERAPY:  1) Alecensa (Alectinib) 600 mg p.o. twice daily.  First dose April 11, 2021.  Status post 20 months of treatment. 2) Eliquis 2.5 mg p.o. twice daily.  First dose 10/11/2021.    INTERVAL HISTORY: Arthur [name] 82 y.o. male to the clinic today for follow-up visit accompanied by his son.Discussed the use of AI scribe software for clinical note transcription with the patient, who gave verbal consent to proceed.  History of Present Illness   The patient, an 82 year old individual with a history of stage four non-small cell lung cancer (adenocarcinoma) with ALK gene translocation, has been on targeted therapy with Alectinib 600mg  twice daily for approximately 20 months. Recently, he has been experiencing issues with Lovenox injections, which were initiated in replacement of Eliquis due to gastrointestinal bleeding and low hemoglobin levels. The injections have resulted in bruising and occasional bleeding at the injection site, causing discomfort.  He also reports a decrease in appetite since starting the injections, although weight has remained stable. He has been taking Protonix to manage gastrointestinal bleeding, which has reportedly helped. He also takes an iron supplement, Fusion Plus, daily. Despite these challenges, he reports no side effects from the Alectinib.  The patient's son, who accompanies him to appointments, has expressed concern about the Lovenox injections and has inquired about alternative options. However, due to the patient's history of gastrointestinal bleeding  with Eliquis, the current treatment plan is deemed necessary.        MEDICAL HISTORY: Past Medical History:  Diagnosis Date   Diabetes mellitus without complication (HCC)    High cholesterol    Hypertension    lung ca    Normal nuclear stress test 01/29/2008   stress perfusion study apparently in 2010 in [facility] which he said was negative.    ALLERGIES:  is allergic to ace inhibitors and iohexol.  MEDICATIONS:  Current Outpatient Medications  Medication Sig Dispense Refill   alectinib (ALECENSA) 150 MG capsule Take 4 capsules (600 mg total) by mouth 2 (two) times daily with a meal. 240 capsule 3   cetirizine (ZYRTEC) 10 MG tablet Take 1 tablet (10 mg total) by mouth daily. (Patient not taking: Reported on 10/23/2022) 30 tablet 0   clopidogrel (PLAVIX) 75 MG tablet Take 1 tablet by mouth once daily with breakfast 90 tablet 1   enoxaparin (LOVENOX) 80 MG/0.8ML injection Inject 0.7 mLs (70 mg total) into the skin every 12 (twelve) hours. 48 mL 3   furosemide (LASIX) 20 MG tablet Take 20 mg by mouth daily as needed for edema (lower extremites). (Patient not taking: Reported on 10/16/2022)     Iron-FA-B Cmp-C-Biot-Probiotic (FUSION PLUS) CAPS Take 1 capsule by mouth daily.     pantoprazole (PROTONIX) 40 MG tablet Take 1 tablet (40 mg total) by mouth 2 (two) times daily for 23 days. Then daily thereafter forever 60 tablet 0   polyethylene glycol (MIRALAX / GLYCOLAX) 17 g packet Take 17 g by mouth 2 (two) times daily. (Patient not taking: Reported on 10/23/2022)     rosuvastatin (CRESTOR) 5 MG tablet Take 1 tablet by mouth  once daily 90 tablet 2   senna (SENOKOT) 8.6 MG TABS tablet Take 1 tablet (8.6 mg total) by mouth daily. (Patient not taking: Reported on 10/23/2022) 120 tablet 0   sodium chloride (OCEAN) 0.65 % SOLN nasal spray Place 1 spray into both nostrils as needed for congestion. 88 mL 1   No current facility-administered medications for this visit.    SURGICAL HISTORY:   Past Surgical History:  Procedure Laterality Date   BIOPSY  10/17/2022   Procedure: BIOPSY;  Surgeon: Jenel Lucks, MD;  Location: [facility] ENDOSCOPY;  Service: Gastroenterology;;   CATARACT EXTRACTION, BILATERAL  2006   CORONARY STENT INTERVENTION N/A 02/06/2021   Procedure: CORONARY STENT INTERVENTION;  Surgeon: Corky Crafts, MD;  Location: [facility] INVASIVE CV LAB;  Service: Cardiovascular;  Laterality: N/A;   CORONARY ULTRASOUND/IVUS N/A 02/06/2021   Procedure: Intravascular Ultrasound/IVUS;  Surgeon: Corky Crafts, MD;  Location: [facility] INVASIVE CV LAB;  Service: Cardiovascular;  Laterality: N/A;   ESOPHAGOGASTRODUODENOSCOPY N/A 10/17/2022   Procedure: ESOPHAGOGASTRODUODENOSCOPY (EGD);  Surgeon: Jenel Lucks, MD;  Location: [facility] ENDOSCOPY;  Service: Gastroenterology;  Laterality: N/A;   FINE NEEDLE ASPIRATION  03/21/2021   Procedure: FINE NEEDLE ASPIRATION;  Surgeon: Josephine Igo, DO;  Location: [facility] ENDOSCOPY;  Service: Pulmonary;;   IR RADIOLOGIST EVAL & MGMT  12/24/2021   KNEE SURGERY  2005   LEFT HEART CATH AND CORONARY ANGIOGRAPHY N/A 02/06/2021   Procedure: LEFT HEART CATH AND CORONARY ANGIOGRAPHY;  Surgeon: Corky Crafts, MD;  Location: [facility] INVASIVE CV LAB;  Service: Cardiovascular;  Laterality: N/A;   VIDEO BRONCHOSCOPY WITH ENDOBRONCHIAL ULTRASOUND Bilateral 03/21/2021   Procedure: VIDEO BRONCHOSCOPY WITH ENDOBRONCHIAL ULTRASOUND;  Surgeon: Josephine Igo, DO;  Location: [facility] ENDOSCOPY;  Service: Pulmonary;  Laterality: Bilateral;    REVIEW OF SYSTEMS:  Constitutional: positive for fatigue Eyes: negative Ears, nose, mouth, throat, and face: negative Respiratory: negative Cardiovascular: negative Gastrointestinal: positive for dyspepsia Genitourinary:negative Integument/breast: negative Hematologic/lymphatic: positive for easy bruising Musculoskeletal:negative Neurological: negative Behavioral/Psych: negative Endocrine: negative Allergic/Immunologic:  negative   PHYSICAL EXAMINATION: General appearance: alert, cooperative, and no distress Head: Normocephalic, without obvious abnormality, atraumatic Neck: no adenopathy, no JVD, supple, symmetrical, trachea midline, and thyroid not enlarged, symmetric, no tenderness/mass/nodules Lymph nodes: Cervical, supraclavicular, and axillary nodes normal. Resp: clear to auscultation bilaterally Back: symmetric, no curvature. ROM normal. No CVA tenderness. Cardio: regular rate and rhythm, S1, S2 normal, no murmur, click, rub or gallop GI: soft, non-tender; bowel sounds normal; no masses,  no organomegaly Extremities: edema trace edema in the left lower extremity Neurologic: Alert and oriented X 3, normal strength and tone. Normal symmetric reflexes. Normal coordination and gait  ECOG PERFORMANCE STATUS: 1 - Symptomatic but completely ambulatory  Blood pressure 139/80, pulse 92, temperature 98 F (36.7 C), temperature source Oral, resp. rate 16, height 5\' 11"  (1.803 m), weight 159 lb 6.4 oz (72.3 kg), SpO2 99%.  LABORATORY DATA: Lab Results  Component Value Date   WBC 6.4 11/20/2022   HGB 9.4 (L) 11/20/2022   HCT 28.5 (L) 11/20/2022   MCV 87.7 11/20/2022   PLT 311 11/20/2022      Chemistry      Component Value Date/Time   NA 136 10/20/2022 0226   NA 141 10/15/2022 1229   K 4.4 10/20/2022 0226   CL 105 10/20/2022 0226   CO2 25 10/20/2022 0226   BUN 22 10/20/2022 0226   BUN 34 (H) 10/15/2022 1229   CREATININE 1.48 (H) 10/20/2022 0226   CREATININE 1.30 (  H) 09/19/2022 1401   CREATININE 1.18 11/15/2015 1114      Component Value Date/Time   CALCIUM 7.8 (L) 10/20/2022 0226   ALKPHOS 65 10/18/2022 0301   AST 29 10/18/2022 0301   AST 25 09/19/2022 1401   ALT 22 10/18/2022 0301   ALT 18 09/19/2022 1401   BILITOT 2.1 (H) 10/18/2022 0301   BILITOT 1.4 (H) 09/19/2022 1401       RADIOGRAPHIC STUDIES: No results found.  ASSESSMENT AND PLAN: This is a very pleasant 82 years old [place]  male diagnosed with Stage IV (T1b, N3, M1 C) non-small cell lung cancer, adenocarcinoma with positive ALK gene translocation in February 2023. The patient is currently undergoing treatment with Alecensa (Alectinib) 600 mg p.o. twice daily.  He has been tolerating the treatment well with no concerning complaints.  He is status post 20 months of treatment.   The patient has been tolerating his treatment with Alecensa (Alectinib) fairly well.    Stage 4 Non-Small Cell Lung Cancer (Adenocarcinoma) with ALK gene translocation Stable on Alectinib 600mg  twice daily for 20 months. No reported side effects. -Continue Alectinib 600mg  twice daily. -Order a scan to be done 7-10 days prior to next visit in 2 months.  Gastrointestinal Bleeding History of GI bleeding secondary to Eliquis leading to anemia. Hemoglobin currently stable at 9.4. Patient is on Protonix and Ferrous Sulfate (Fusion Plus) for management. -Continue Protonix and Ferrous Sulfate (Fusion Plus). -Advise patient to avoid spicy food and soda to prevent further irritation of the stomach.  Anticoagulation Patient switched from Eliquis to Lovenox due to GI bleeding. Patient experiencing bruising and discomfort with Lovenox injections. -Continue Lovenox injections in the abdominal area for optimal absorption. -Advise patient that bruising is an expected side effect of Lovenox injections.  Follow-up in 2 months with a scan 7-10 days prior to the visit.   The patient was advised to call immediately if he has any other concerning symptoms in the interval.   All questions were answered. The patient knows to call the clinic with any problems, questions or concerns. We can certainly see the patient much sooner if necessary.  The total time spent in the appointment was 30 minutes.  Disclaimer: This note was dictated with voice recognition software. Similar sounding words can inadvertently be transcribed and may not be corrected upon review.

## 2022-11-21 DIAGNOSIS — E785 Hyperlipidemia, unspecified: Secondary | ICD-10-CM | POA: Diagnosis not present

## 2022-11-21 DIAGNOSIS — Z86718 Personal history of other venous thrombosis and embolism: Secondary | ICD-10-CM | POA: Diagnosis not present

## 2022-11-21 DIAGNOSIS — E119 Type 2 diabetes mellitus without complications: Secondary | ICD-10-CM | POA: Diagnosis not present

## 2022-11-21 DIAGNOSIS — Z955 Presence of coronary angioplasty implant and graft: Secondary | ICD-10-CM | POA: Diagnosis not present

## 2022-11-21 DIAGNOSIS — M21372 Foot drop, left foot: Secondary | ICD-10-CM | POA: Diagnosis not present

## 2022-11-21 DIAGNOSIS — N289 Disorder of kidney and ureter, unspecified: Secondary | ICD-10-CM | POA: Diagnosis not present

## 2022-11-21 DIAGNOSIS — M1990 Unspecified osteoarthritis, unspecified site: Secondary | ICD-10-CM | POA: Diagnosis not present

## 2022-11-21 DIAGNOSIS — K264 Chronic or unspecified duodenal ulcer with hemorrhage: Secondary | ICD-10-CM | POA: Diagnosis not present

## 2022-11-21 DIAGNOSIS — D500 Iron deficiency anemia secondary to blood loss (chronic): Secondary | ICD-10-CM | POA: Diagnosis not present

## 2022-11-21 DIAGNOSIS — Z86711 Personal history of pulmonary embolism: Secondary | ICD-10-CM | POA: Diagnosis not present

## 2022-11-21 DIAGNOSIS — R54 Age-related physical debility: Secondary | ICD-10-CM | POA: Diagnosis not present

## 2022-11-21 DIAGNOSIS — Z7902 Long term (current) use of antithrombotics/antiplatelets: Secondary | ICD-10-CM | POA: Diagnosis not present

## 2022-11-21 DIAGNOSIS — K5900 Constipation, unspecified: Secondary | ICD-10-CM | POA: Diagnosis not present

## 2022-11-21 DIAGNOSIS — I2583 Coronary atherosclerosis due to lipid rich plaque: Secondary | ICD-10-CM | POA: Diagnosis not present

## 2022-11-21 DIAGNOSIS — I480 Paroxysmal atrial fibrillation: Secondary | ICD-10-CM | POA: Diagnosis not present

## 2022-11-21 DIAGNOSIS — C3491 Malignant neoplasm of unspecified part of right bronchus or lung: Secondary | ICD-10-CM | POA: Diagnosis not present

## 2022-11-21 DIAGNOSIS — K219 Gastro-esophageal reflux disease without esophagitis: Secondary | ICD-10-CM | POA: Diagnosis not present

## 2022-11-21 DIAGNOSIS — I10 Essential (primary) hypertension: Secondary | ICD-10-CM | POA: Diagnosis not present

## 2022-11-21 DIAGNOSIS — M109 Gout, unspecified: Secondary | ICD-10-CM | POA: Diagnosis not present

## 2022-11-21 DIAGNOSIS — D630 Anemia in neoplastic disease: Secondary | ICD-10-CM | POA: Diagnosis not present

## 2022-11-21 DIAGNOSIS — I2510 Atherosclerotic heart disease of native coronary artery without angina pectoris: Secondary | ICD-10-CM | POA: Diagnosis not present

## 2022-11-21 DIAGNOSIS — Z8673 Personal history of transient ischemic attack (TIA), and cerebral infarction without residual deficits: Secondary | ICD-10-CM | POA: Diagnosis not present

## 2022-11-22 DIAGNOSIS — E785 Hyperlipidemia, unspecified: Secondary | ICD-10-CM | POA: Diagnosis not present

## 2022-11-22 DIAGNOSIS — K5900 Constipation, unspecified: Secondary | ICD-10-CM | POA: Diagnosis not present

## 2022-11-22 DIAGNOSIS — I2583 Coronary atherosclerosis due to lipid rich plaque: Secondary | ICD-10-CM | POA: Diagnosis not present

## 2022-11-22 DIAGNOSIS — Z86718 Personal history of other venous thrombosis and embolism: Secondary | ICD-10-CM | POA: Diagnosis not present

## 2022-11-22 DIAGNOSIS — Z86711 Personal history of pulmonary embolism: Secondary | ICD-10-CM | POA: Diagnosis not present

## 2022-11-22 DIAGNOSIS — D500 Iron deficiency anemia secondary to blood loss (chronic): Secondary | ICD-10-CM | POA: Diagnosis not present

## 2022-11-22 DIAGNOSIS — Z8673 Personal history of transient ischemic attack (TIA), and cerebral infarction without residual deficits: Secondary | ICD-10-CM | POA: Diagnosis not present

## 2022-11-22 DIAGNOSIS — M109 Gout, unspecified: Secondary | ICD-10-CM | POA: Diagnosis not present

## 2022-11-22 DIAGNOSIS — I2510 Atherosclerotic heart disease of native coronary artery without angina pectoris: Secondary | ICD-10-CM | POA: Diagnosis not present

## 2022-11-22 DIAGNOSIS — E119 Type 2 diabetes mellitus without complications: Secondary | ICD-10-CM | POA: Diagnosis not present

## 2022-11-22 DIAGNOSIS — D630 Anemia in neoplastic disease: Secondary | ICD-10-CM | POA: Diagnosis not present

## 2022-11-22 DIAGNOSIS — R54 Age-related physical debility: Secondary | ICD-10-CM | POA: Diagnosis not present

## 2022-11-22 DIAGNOSIS — M1990 Unspecified osteoarthritis, unspecified site: Secondary | ICD-10-CM | POA: Diagnosis not present

## 2022-11-22 DIAGNOSIS — C3491 Malignant neoplasm of unspecified part of right bronchus or lung: Secondary | ICD-10-CM | POA: Diagnosis not present

## 2022-11-22 DIAGNOSIS — Z955 Presence of coronary angioplasty implant and graft: Secondary | ICD-10-CM | POA: Diagnosis not present

## 2022-11-22 DIAGNOSIS — I480 Paroxysmal atrial fibrillation: Secondary | ICD-10-CM | POA: Diagnosis not present

## 2022-11-22 DIAGNOSIS — N289 Disorder of kidney and ureter, unspecified: Secondary | ICD-10-CM | POA: Diagnosis not present

## 2022-11-22 DIAGNOSIS — M21372 Foot drop, left foot: Secondary | ICD-10-CM | POA: Diagnosis not present

## 2022-11-22 DIAGNOSIS — K219 Gastro-esophageal reflux disease without esophagitis: Secondary | ICD-10-CM | POA: Diagnosis not present

## 2022-11-22 DIAGNOSIS — K264 Chronic or unspecified duodenal ulcer with hemorrhage: Secondary | ICD-10-CM | POA: Diagnosis not present

## 2022-11-22 DIAGNOSIS — Z7902 Long term (current) use of antithrombotics/antiplatelets: Secondary | ICD-10-CM | POA: Diagnosis not present

## 2022-11-22 DIAGNOSIS — I10 Essential (primary) hypertension: Secondary | ICD-10-CM | POA: Diagnosis not present

## 2022-11-26 ENCOUNTER — Other Ambulatory Visit (HOSPITAL_COMMUNITY): Payer: Self-pay

## 2022-11-26 ENCOUNTER — Encounter: Payer: Self-pay | Admitting: Family Medicine

## 2022-11-26 ENCOUNTER — Encounter: Payer: Self-pay | Admitting: Medical Oncology

## 2022-11-26 ENCOUNTER — Other Ambulatory Visit: Payer: Self-pay | Admitting: Internal Medicine

## 2022-11-26 NOTE — Progress Notes (Signed)
Specialty Pharmacy Refill Coordination Note  Arthur [name] is a 82 y.o. male contacted today regarding refills of specialty medication(s) Alectinib Hcl   Patient requested Delivery   Delivery date: 12/06/22   Verified address: [address] [name] [phone]   Medication will be filled on 12/05/22. *Pending Refill Request*

## 2022-11-26 NOTE — Telephone Encounter (Signed)
Protonix refill requested

## 2022-11-26 NOTE — Progress Notes (Signed)
Specialty Pharmacy Ongoing Clinical Assessment Note  Arthur [name] is a 82 y.o. male who is being followed by the specialty pharmacy service for RxSp Oncology   [name] specialty medication(s) reviewed today: Alectinib Hcl   Missed doses in the last 4 weeks: 0   Patient/Caregiver did not have any additional questions or concerns.   Therapeutic benefit summary: Patient is achieving benefit   Adverse events/side effects summary: No adverse events/side effects   [name] therapy is appropriate to: Continue    Goals Addressed             This Visit's Progress    Slow Disease Progression       Patient is on track. Patient will maintain adherence.  Dr. Arbutus Ped documented stable disease at the last office visit on 11/20/22.          Follow up:  6 months  [name] Specialty Pharmacist   Clinical Intervention Note  Clinical Intervention Notes: Pt started Lovenox, no DDIs identified.   Clinical Intervention Outcomes: Prevention of an adverse drug event   [name]

## 2022-11-27 ENCOUNTER — Other Ambulatory Visit: Payer: Self-pay | Admitting: Internal Medicine

## 2022-11-27 ENCOUNTER — Other Ambulatory Visit: Payer: Self-pay

## 2022-11-27 MED ORDER — ALECENSA 150 MG PO CAPS
600.0000 mg | ORAL_CAPSULE | Freq: Two times a day (BID) | ORAL | 3 refills | Status: DC
  Filled 2022-11-27: qty 240, 30d supply, fill #0
  Filled 2022-12-30: qty 240, 30d supply, fill #1
  Filled 2023-01-21: qty 240, 30d supply, fill #2
  Filled 2023-02-19: qty 240, 30d supply, fill #3

## 2022-11-28 DIAGNOSIS — M21372 Foot drop, left foot: Secondary | ICD-10-CM | POA: Diagnosis not present

## 2022-11-28 DIAGNOSIS — M1990 Unspecified osteoarthritis, unspecified site: Secondary | ICD-10-CM | POA: Diagnosis not present

## 2022-11-28 DIAGNOSIS — I2510 Atherosclerotic heart disease of native coronary artery without angina pectoris: Secondary | ICD-10-CM | POA: Diagnosis not present

## 2022-11-28 DIAGNOSIS — K264 Chronic or unspecified duodenal ulcer with hemorrhage: Secondary | ICD-10-CM | POA: Diagnosis not present

## 2022-11-28 DIAGNOSIS — Z955 Presence of coronary angioplasty implant and graft: Secondary | ICD-10-CM | POA: Diagnosis not present

## 2022-11-28 DIAGNOSIS — N289 Disorder of kidney and ureter, unspecified: Secondary | ICD-10-CM | POA: Diagnosis not present

## 2022-11-28 DIAGNOSIS — Z7902 Long term (current) use of antithrombotics/antiplatelets: Secondary | ICD-10-CM | POA: Diagnosis not present

## 2022-11-28 DIAGNOSIS — Z8673 Personal history of transient ischemic attack (TIA), and cerebral infarction without residual deficits: Secondary | ICD-10-CM | POA: Diagnosis not present

## 2022-11-28 DIAGNOSIS — E785 Hyperlipidemia, unspecified: Secondary | ICD-10-CM | POA: Diagnosis not present

## 2022-11-28 DIAGNOSIS — I10 Essential (primary) hypertension: Secondary | ICD-10-CM | POA: Diagnosis not present

## 2022-11-28 DIAGNOSIS — K5900 Constipation, unspecified: Secondary | ICD-10-CM | POA: Diagnosis not present

## 2022-11-28 DIAGNOSIS — K219 Gastro-esophageal reflux disease without esophagitis: Secondary | ICD-10-CM | POA: Diagnosis not present

## 2022-11-28 DIAGNOSIS — I2583 Coronary atherosclerosis due to lipid rich plaque: Secondary | ICD-10-CM | POA: Diagnosis not present

## 2022-11-28 DIAGNOSIS — M109 Gout, unspecified: Secondary | ICD-10-CM | POA: Diagnosis not present

## 2022-11-28 DIAGNOSIS — D630 Anemia in neoplastic disease: Secondary | ICD-10-CM | POA: Diagnosis not present

## 2022-11-28 DIAGNOSIS — Z86718 Personal history of other venous thrombosis and embolism: Secondary | ICD-10-CM | POA: Diagnosis not present

## 2022-11-28 DIAGNOSIS — Z86711 Personal history of pulmonary embolism: Secondary | ICD-10-CM | POA: Diagnosis not present

## 2022-11-28 DIAGNOSIS — D500 Iron deficiency anemia secondary to blood loss (chronic): Secondary | ICD-10-CM | POA: Diagnosis not present

## 2022-11-28 DIAGNOSIS — R54 Age-related physical debility: Secondary | ICD-10-CM | POA: Diagnosis not present

## 2022-11-28 DIAGNOSIS — E119 Type 2 diabetes mellitus without complications: Secondary | ICD-10-CM | POA: Diagnosis not present

## 2022-11-28 DIAGNOSIS — C3491 Malignant neoplasm of unspecified part of right bronchus or lung: Secondary | ICD-10-CM | POA: Diagnosis not present

## 2022-11-28 DIAGNOSIS — I480 Paroxysmal atrial fibrillation: Secondary | ICD-10-CM | POA: Diagnosis not present

## 2022-11-28 MED ORDER — PANTOPRAZOLE SODIUM 40 MG PO TBEC: 40.0000 mg | DELAYED_RELEASE_TABLET | Freq: Every day | ORAL | 1 refills | Status: DC

## 2022-12-05 DIAGNOSIS — Z7902 Long term (current) use of antithrombotics/antiplatelets: Secondary | ICD-10-CM | POA: Diagnosis not present

## 2022-12-05 DIAGNOSIS — M109 Gout, unspecified: Secondary | ICD-10-CM | POA: Diagnosis not present

## 2022-12-05 DIAGNOSIS — K264 Chronic or unspecified duodenal ulcer with hemorrhage: Secondary | ICD-10-CM | POA: Diagnosis not present

## 2022-12-05 DIAGNOSIS — Z8673 Personal history of transient ischemic attack (TIA), and cerebral infarction without residual deficits: Secondary | ICD-10-CM | POA: Diagnosis not present

## 2022-12-05 DIAGNOSIS — Z86711 Personal history of pulmonary embolism: Secondary | ICD-10-CM | POA: Diagnosis not present

## 2022-12-05 DIAGNOSIS — I480 Paroxysmal atrial fibrillation: Secondary | ICD-10-CM | POA: Diagnosis not present

## 2022-12-05 DIAGNOSIS — K219 Gastro-esophageal reflux disease without esophagitis: Secondary | ICD-10-CM | POA: Diagnosis not present

## 2022-12-05 DIAGNOSIS — Z86718 Personal history of other venous thrombosis and embolism: Secondary | ICD-10-CM | POA: Diagnosis not present

## 2022-12-05 DIAGNOSIS — R54 Age-related physical debility: Secondary | ICD-10-CM | POA: Diagnosis not present

## 2022-12-05 DIAGNOSIS — M1990 Unspecified osteoarthritis, unspecified site: Secondary | ICD-10-CM | POA: Diagnosis not present

## 2022-12-05 DIAGNOSIS — E119 Type 2 diabetes mellitus without complications: Secondary | ICD-10-CM | POA: Diagnosis not present

## 2022-12-05 DIAGNOSIS — I10 Essential (primary) hypertension: Secondary | ICD-10-CM | POA: Diagnosis not present

## 2022-12-05 DIAGNOSIS — Z955 Presence of coronary angioplasty implant and graft: Secondary | ICD-10-CM | POA: Diagnosis not present

## 2022-12-05 DIAGNOSIS — D630 Anemia in neoplastic disease: Secondary | ICD-10-CM | POA: Diagnosis not present

## 2022-12-05 DIAGNOSIS — K5900 Constipation, unspecified: Secondary | ICD-10-CM | POA: Diagnosis not present

## 2022-12-05 DIAGNOSIS — C3491 Malignant neoplasm of unspecified part of right bronchus or lung: Secondary | ICD-10-CM | POA: Diagnosis not present

## 2022-12-05 DIAGNOSIS — D500 Iron deficiency anemia secondary to blood loss (chronic): Secondary | ICD-10-CM | POA: Diagnosis not present

## 2022-12-05 DIAGNOSIS — E785 Hyperlipidemia, unspecified: Secondary | ICD-10-CM | POA: Diagnosis not present

## 2022-12-05 DIAGNOSIS — I2510 Atherosclerotic heart disease of native coronary artery without angina pectoris: Secondary | ICD-10-CM | POA: Diagnosis not present

## 2022-12-05 DIAGNOSIS — I2583 Coronary atherosclerosis due to lipid rich plaque: Secondary | ICD-10-CM | POA: Diagnosis not present

## 2022-12-05 DIAGNOSIS — N289 Disorder of kidney and ureter, unspecified: Secondary | ICD-10-CM | POA: Diagnosis not present

## 2022-12-05 DIAGNOSIS — M21372 Foot drop, left foot: Secondary | ICD-10-CM | POA: Diagnosis not present

## 2022-12-12 DIAGNOSIS — I480 Paroxysmal atrial fibrillation: Secondary | ICD-10-CM | POA: Diagnosis not present

## 2022-12-12 DIAGNOSIS — Z8673 Personal history of transient ischemic attack (TIA), and cerebral infarction without residual deficits: Secondary | ICD-10-CM | POA: Diagnosis not present

## 2022-12-12 DIAGNOSIS — I10 Essential (primary) hypertension: Secondary | ICD-10-CM | POA: Diagnosis not present

## 2022-12-12 DIAGNOSIS — Z7902 Long term (current) use of antithrombotics/antiplatelets: Secondary | ICD-10-CM | POA: Diagnosis not present

## 2022-12-12 DIAGNOSIS — Z86711 Personal history of pulmonary embolism: Secondary | ICD-10-CM | POA: Diagnosis not present

## 2022-12-12 DIAGNOSIS — Z955 Presence of coronary angioplasty implant and graft: Secondary | ICD-10-CM | POA: Diagnosis not present

## 2022-12-12 DIAGNOSIS — K5900 Constipation, unspecified: Secondary | ICD-10-CM | POA: Diagnosis not present

## 2022-12-12 DIAGNOSIS — K219 Gastro-esophageal reflux disease without esophagitis: Secondary | ICD-10-CM | POA: Diagnosis not present

## 2022-12-12 DIAGNOSIS — M1990 Unspecified osteoarthritis, unspecified site: Secondary | ICD-10-CM | POA: Diagnosis not present

## 2022-12-12 DIAGNOSIS — N289 Disorder of kidney and ureter, unspecified: Secondary | ICD-10-CM | POA: Diagnosis not present

## 2022-12-12 DIAGNOSIS — Z86718 Personal history of other venous thrombosis and embolism: Secondary | ICD-10-CM | POA: Diagnosis not present

## 2022-12-12 DIAGNOSIS — D500 Iron deficiency anemia secondary to blood loss (chronic): Secondary | ICD-10-CM | POA: Diagnosis not present

## 2022-12-12 DIAGNOSIS — M21372 Foot drop, left foot: Secondary | ICD-10-CM | POA: Diagnosis not present

## 2022-12-12 DIAGNOSIS — D630 Anemia in neoplastic disease: Secondary | ICD-10-CM | POA: Diagnosis not present

## 2022-12-12 DIAGNOSIS — I2510 Atherosclerotic heart disease of native coronary artery without angina pectoris: Secondary | ICD-10-CM | POA: Diagnosis not present

## 2022-12-12 DIAGNOSIS — R54 Age-related physical debility: Secondary | ICD-10-CM | POA: Diagnosis not present

## 2022-12-12 DIAGNOSIS — K264 Chronic or unspecified duodenal ulcer with hemorrhage: Secondary | ICD-10-CM | POA: Diagnosis not present

## 2022-12-12 DIAGNOSIS — E119 Type 2 diabetes mellitus without complications: Secondary | ICD-10-CM | POA: Diagnosis not present

## 2022-12-12 DIAGNOSIS — M109 Gout, unspecified: Secondary | ICD-10-CM | POA: Diagnosis not present

## 2022-12-12 DIAGNOSIS — I2583 Coronary atherosclerosis due to lipid rich plaque: Secondary | ICD-10-CM | POA: Diagnosis not present

## 2022-12-12 DIAGNOSIS — C3491 Malignant neoplasm of unspecified part of right bronchus or lung: Secondary | ICD-10-CM | POA: Diagnosis not present

## 2022-12-12 DIAGNOSIS — E785 Hyperlipidemia, unspecified: Secondary | ICD-10-CM | POA: Diagnosis not present

## 2022-12-14 ENCOUNTER — Other Ambulatory Visit (HOSPITAL_COMMUNITY): Payer: Self-pay

## 2022-12-16 ENCOUNTER — Other Ambulatory Visit: Payer: Self-pay

## 2022-12-17 ENCOUNTER — Other Ambulatory Visit (HOSPITAL_COMMUNITY): Payer: Self-pay

## 2022-12-17 ENCOUNTER — Other Ambulatory Visit: Payer: Self-pay

## 2022-12-17 DIAGNOSIS — Z955 Presence of coronary angioplasty implant and graft: Secondary | ICD-10-CM | POA: Diagnosis not present

## 2022-12-17 DIAGNOSIS — E119 Type 2 diabetes mellitus without complications: Secondary | ICD-10-CM | POA: Diagnosis not present

## 2022-12-17 DIAGNOSIS — Z7902 Long term (current) use of antithrombotics/antiplatelets: Secondary | ICD-10-CM | POA: Diagnosis not present

## 2022-12-17 DIAGNOSIS — I10 Essential (primary) hypertension: Secondary | ICD-10-CM | POA: Diagnosis not present

## 2022-12-17 DIAGNOSIS — K219 Gastro-esophageal reflux disease without esophagitis: Secondary | ICD-10-CM | POA: Diagnosis not present

## 2022-12-17 DIAGNOSIS — Z8673 Personal history of transient ischemic attack (TIA), and cerebral infarction without residual deficits: Secondary | ICD-10-CM | POA: Diagnosis not present

## 2022-12-17 DIAGNOSIS — I480 Paroxysmal atrial fibrillation: Secondary | ICD-10-CM | POA: Diagnosis not present

## 2022-12-17 DIAGNOSIS — Z86711 Personal history of pulmonary embolism: Secondary | ICD-10-CM | POA: Diagnosis not present

## 2022-12-17 DIAGNOSIS — E785 Hyperlipidemia, unspecified: Secondary | ICD-10-CM | POA: Diagnosis not present

## 2022-12-17 DIAGNOSIS — M21372 Foot drop, left foot: Secondary | ICD-10-CM | POA: Diagnosis not present

## 2022-12-17 DIAGNOSIS — D630 Anemia in neoplastic disease: Secondary | ICD-10-CM | POA: Diagnosis not present

## 2022-12-17 DIAGNOSIS — R54 Age-related physical debility: Secondary | ICD-10-CM | POA: Diagnosis not present

## 2022-12-17 DIAGNOSIS — Z86718 Personal history of other venous thrombosis and embolism: Secondary | ICD-10-CM | POA: Diagnosis not present

## 2022-12-17 DIAGNOSIS — K264 Chronic or unspecified duodenal ulcer with hemorrhage: Secondary | ICD-10-CM | POA: Diagnosis not present

## 2022-12-17 DIAGNOSIS — K5900 Constipation, unspecified: Secondary | ICD-10-CM | POA: Diagnosis not present

## 2022-12-17 DIAGNOSIS — I2510 Atherosclerotic heart disease of native coronary artery without angina pectoris: Secondary | ICD-10-CM | POA: Diagnosis not present

## 2022-12-17 DIAGNOSIS — M109 Gout, unspecified: Secondary | ICD-10-CM | POA: Diagnosis not present

## 2022-12-17 DIAGNOSIS — N289 Disorder of kidney and ureter, unspecified: Secondary | ICD-10-CM | POA: Diagnosis not present

## 2022-12-17 DIAGNOSIS — D500 Iron deficiency anemia secondary to blood loss (chronic): Secondary | ICD-10-CM | POA: Diagnosis not present

## 2022-12-17 DIAGNOSIS — C3491 Malignant neoplasm of unspecified part of right bronchus or lung: Secondary | ICD-10-CM | POA: Diagnosis not present

## 2022-12-17 DIAGNOSIS — I2583 Coronary atherosclerosis due to lipid rich plaque: Secondary | ICD-10-CM | POA: Diagnosis not present

## 2022-12-17 DIAGNOSIS — M1990 Unspecified osteoarthritis, unspecified site: Secondary | ICD-10-CM | POA: Diagnosis not present

## 2022-12-23 ENCOUNTER — Other Ambulatory Visit (HOSPITAL_COMMUNITY): Payer: Self-pay

## 2022-12-23 ENCOUNTER — Other Ambulatory Visit: Payer: Self-pay

## 2022-12-23 NOTE — Patient Instructions (Incomplete)
Good to see you today - Thank you for coming in  Things we discussed today:  I sent a prescription to your pharmacy for your Zoster vaccines to help prevent Shingles.  The shot may cause a sore arm and mild flu like symptoms for a few days.  You will need a second shot 2 months after your first.    Please always bring your medication bottles  I will really miss working with you - Be Well

## 2022-12-23 NOTE — Progress Notes (Unsigned)
SUBJECTIVE:   CHIEF COMPLAINT / HPI:   Duodenal Ulcer Taking protonix daily.  No epigastric pain or any bleeding  Anemia No lightheadness or dizziness or fatigue.  Take iron daily  PE Taking lovenox twice a day.  No bleeding or skin irritation  Cough Has frequent waxing and waning dry cough.  No fever or chills or sputum.  Mild right sided focal anterior lower rib tenderness.  Takes tessalon and mucinex which he believe helps.     Weight loss He does not have an appetite can eat only small amounts.  No pain or nausea.  Does not like Ensure.  Not walking for exercise   OBJECTIVE:   BP 129/72   Pulse 95   Ht 5\' 11"  (1.803 m)   Wt 154 lb (69.9 kg)   SpO2 99%   BMI 21.48 kg/m   Good spirits Heart - irreg  Lungs:  Normal respiratory effort, chest expands symmetrically. Lungs are clear to auscultation, no crackles or wheezes. Abdomen - soft nontender not masses Chest wall - focal L lower anterior rib tenderness to palpation   ASSESSMENT/PLAN:   Type 2 diabetes mellitus with other specified complication, without long-term current use of insulin (HCC) Assessment & Plan: Well controlled by diet.   Orders: -     POCT glycosylated hemoglobin (Hb A1C)  Pulmonary embolism, other, unspecified chronicity, unspecified whether acute cor pulmonale present (HCC) -     Enoxaparin Sodium; Inject 0.7 mLs (70 mg total) into the skin every 12 (twelve) hours.  Dispense: 48 mL; Refill: 1  Encounter for immunization -     Flu Vaccine Trivalent High Dose (Fluad)  Deep venous embolism and thrombosis (HCC) Assessment & Plan: Stable on twice a day lovenox being managed by Hematology.    Duodenal ulcer Assessment & Plan: Asymptomatic on daily PPI.  Continue this while on anticoagulation and antiplatelets   Foot drop, left foot Assessment & Plan: Wears a brace continually. Able to walk short distances.  No further treatments to offer.    Anemia, unspecified type Assessment &  Plan: Asymptomatic today.  Will check labs if he develops any symptoms   Weight loss Assessment & Plan: Wt Readings from Last 3 Encounters:  12/24/22 154 lb (69.9 kg)  11/20/22 159 lb 6.4 oz (72.3 kg)  10/28/22 157 lb (71.2 kg)   Mildly decreased today.  He would like a medicine to give him an appetite.   The prednisone he took in the past helped with with this.  Decreased appetite is likely due to his cancer therapy.  Do not feel further work up indicated unless significantly worsens.  Recommend regular mild exercise and frequent meals and daily MVI.   He can discuss with oncology if worsens   Paroxysmal atrial fibrillation [facility]) Assessment & Plan: Well tolerated on anticoagulation for PE.  With anemia would not try to lower heart rate unless is regularly > 110 or he is symptomatic.   Chronic cough Assessment & Plan: Chronic for many years.  No current signs of infection.  Likely due to residual lung cancer or PE.  On PPI and does not have any nasal symptoms.  Treating symptomatically.  He likes to take tessalon and mucinex once daily - ok to continue this.  Also suggested honey.  He has a follow up CT by Oncology in a month to look for structural changes.     Other orders -     Benzonatate; Take 1 capsule (100 mg total) by  mouth 2 (two) times daily as needed for cough.  Dispense: 30 capsule; Refill: 1     Patient Instructions  Good to see you today - Thank you for coming in  Things we discussed today:  Cough Try a tsp of honey up to three times a day  Can also use a cough syryp  If the cough if worse or any fever or any shortness of breath call me  Ask Dr Arbutus Ped about  - something to increase your appetite if you continue to lose weight   Exercise by walking every day  Start slow but build up    You need an diabetes eye exam every year.  Please see your eye doctor.  Ask them to fax [place] a report of your exam   Please always bring your medication bottles  I will  really miss working with you - Be Well    Carney Living, MD [facility]

## 2022-12-24 ENCOUNTER — Ambulatory Visit (INDEPENDENT_AMBULATORY_CARE_PROVIDER_SITE_OTHER): Payer: 59 | Admitting: Family Medicine

## 2022-12-24 VITALS — BP 129/72 | HR 95 | Ht 71.0 in | Wt 154.0 lb

## 2022-12-24 DIAGNOSIS — I82409 Acute embolism and thrombosis of unspecified deep veins of unspecified lower extremity: Secondary | ICD-10-CM

## 2022-12-24 DIAGNOSIS — K269 Duodenal ulcer, unspecified as acute or chronic, without hemorrhage or perforation: Secondary | ICD-10-CM | POA: Diagnosis not present

## 2022-12-24 DIAGNOSIS — Z23 Encounter for immunization: Secondary | ICD-10-CM

## 2022-12-24 DIAGNOSIS — E1169 Type 2 diabetes mellitus with other specified complication: Secondary | ICD-10-CM | POA: Diagnosis not present

## 2022-12-24 DIAGNOSIS — M21372 Foot drop, left foot: Secondary | ICD-10-CM | POA: Diagnosis not present

## 2022-12-24 DIAGNOSIS — D649 Anemia, unspecified: Secondary | ICD-10-CM | POA: Diagnosis not present

## 2022-12-24 DIAGNOSIS — I2699 Other pulmonary embolism without acute cor pulmonale: Secondary | ICD-10-CM

## 2022-12-24 DIAGNOSIS — I480 Paroxysmal atrial fibrillation: Secondary | ICD-10-CM

## 2022-12-24 DIAGNOSIS — R634 Abnormal weight loss: Secondary | ICD-10-CM | POA: Diagnosis not present

## 2022-12-24 DIAGNOSIS — R053 Chronic cough: Secondary | ICD-10-CM | POA: Diagnosis not present

## 2022-12-24 LAB — POCT GLYCOSYLATED HEMOGLOBIN (HGB A1C): Hemoglobin A1C: 5.4 % (ref 4.0–5.6)

## 2022-12-24 MED ORDER — BENZONATATE 100 MG PO CAPS: 100.0000 mg | ORAL_CAPSULE | Freq: Two times a day (BID) | ORAL | 1 refills | Status: DC | PRN

## 2022-12-24 MED ORDER — ENOXAPARIN SODIUM 80 MG/0.8ML IJ SOSY: 70.0000 mg | PREFILLED_SYRINGE | Freq: Two times a day (BID) | INTRAMUSCULAR | 1 refills | Status: DC

## 2022-12-24 NOTE — Assessment & Plan Note (Signed)
Wears a brace continually. Able to walk short distances.  No further treatments to offer.

## 2022-12-24 NOTE — Assessment & Plan Note (Signed)
Chronic for many years.  No current signs of infection.  Likely due to residual lung cancer or PE.  On PPI and does not have any nasal symptoms.  Treating symptomatically.  He likes to take tessalon and mucinex once daily - ok to continue this.  Also suggested honey.  He has a follow up CT by Oncology in a month to look for structural changes.

## 2022-12-24 NOTE — Assessment & Plan Note (Signed)
Wt Readings from Last 3 Encounters:  12/24/22 154 lb (69.9 kg)  11/20/22 159 lb 6.4 oz (72.3 kg)  10/28/22 157 lb (71.2 kg)   Mildly decreased today.  He would like a medicine to give him an appetite.   The prednisone he took in the past helped with with this.  Decreased appetite is likely due to his cancer therapy.  Do not feel further work up indicated unless significantly worsens.  Recommend regular mild exercise and frequent meals and daily MVI.   He can discuss with oncology if worsens

## 2022-12-24 NOTE — Assessment & Plan Note (Signed)
Stable on twice a day lovenox being managed by Hematology.

## 2022-12-24 NOTE — Assessment & Plan Note (Signed)
Well controlled by diet.

## 2022-12-24 NOTE — Assessment & Plan Note (Signed)
Asymptomatic on daily PPI.  Continue this while on anticoagulation and antiplatelets

## 2022-12-24 NOTE — Assessment & Plan Note (Signed)
Well tolerated on anticoagulation for PE.  With anemia would not try to lower heart rate unless is regularly > 110 or he is symptomatic.

## 2022-12-24 NOTE — Assessment & Plan Note (Signed)
Asymptomatic today.  Will check labs if he develops any symptoms

## 2022-12-25 ENCOUNTER — Other Ambulatory Visit: Payer: Self-pay | Admitting: Family Medicine

## 2022-12-27 ENCOUNTER — Other Ambulatory Visit: Payer: Self-pay

## 2022-12-28 ENCOUNTER — Encounter: Payer: Self-pay | Admitting: Family Medicine

## 2022-12-29 ENCOUNTER — Telehealth: Payer: Self-pay | Admitting: Family Medicine

## 2022-12-29 NOTE — Telephone Encounter (Signed)
Text conversation with daughter He has been having mild-moderate abdomen pain for the last few days.  No shortness of breath or bleeding.  Tylenol is helping some.  I recommend they not take ibuprofen due to his recent bleeding ulcer  Will make an appointment with ATC on Monday at 910

## 2022-12-30 ENCOUNTER — Other Ambulatory Visit: Payer: Self-pay

## 2022-12-30 ENCOUNTER — Ambulatory Visit (INDEPENDENT_AMBULATORY_CARE_PROVIDER_SITE_OTHER): Payer: 59 | Admitting: Family Medicine

## 2022-12-30 VITALS — BP 121/80 | HR 92 | Ht 71.0 in

## 2022-12-30 DIAGNOSIS — R053 Chronic cough: Secondary | ICD-10-CM | POA: Diagnosis not present

## 2022-12-30 DIAGNOSIS — R0789 Other chest pain: Secondary | ICD-10-CM | POA: Insufficient documentation

## 2022-12-30 MED ORDER — DICLOFENAC SODIUM 1 % EX GEL: 2.0000 g | Freq: Four times a day (QID) | CUTANEOUS | 0 refills | Status: DC

## 2022-12-30 MED ORDER — LIDOCAINE 5 % EX PTCH: 1.0000 | MEDICATED_PATCH | CUTANEOUS | 0 refills | Status: DC

## 2022-12-30 NOTE — Progress Notes (Signed)
    SUBJECTIVE:   CHIEF COMPLAINT / HPI:   Abdominal pain Has been having mild to moderate abdominal pain for the last 2 weeks.  Has been coughing a lot more recently.  Feels the pain more when he coughs or moves around in bed. No SOB or bleeding.  No chest pain other than that 1 spot.  Has been using Voltaren gel twice a day as well as heating which has overall improved the condition.  Tried Tylenol without much relief.  Has not been taking ibuprofen due to recent bleeding ulcer.  Spoke with PCP Dr. Deirdre Priest who scheduled appointment today.  Chronic cough Has been seen for this in the past.  Feels like a dry cough.  Has tried Tessalon in the past without help.  Also has nasal congestion.  No shortness of breath or generalized chest pain.  PERTINENT  PMH / PSH: Adenocarcinoma of right lung stage IV on treatment, DVT, cerebral embolism with infarction, CAD, pulmonary embolism, hypertension, PAF on chronic anticoagulation, GERD, duodenal ulcer, T2DM  OBJECTIVE:   BP 121/80   Pulse 92   Ht 5\' 11"  (1.803 m)   SpO2 99%   BMI 21.48 kg/m   General: Alert and oriented, in NAD Skin: Warm, dry, and intact without lesions HEENT: NCAT, EOM grossly normal, midline nasal septum Cardiac: Regular rate, irregular rhythm, no m/r/g appreciated Respiratory/Chest: CTAB, breathing and speaking comfortably on RA, point tenderness to palpation over anterior 7/8 rib space Abdominal: Soft, nontender, nondistended, normoactive bowel sounds Extremities: Moves all extremities grossly equally Neurological: No gross focal deficit Psychiatric: Appropriate mood and affect   ASSESSMENT/PLAN:   Chest wall pain History and exam suggestive of muscular chest wall pain versus true abdominal pathology.  Reassured by lack of systemic symptoms and overall improving status.  Also consider pathologic fracture due to metastatic bone disease, though previous imaging reassuring.  Repeat CT chest ordered and pending; will send  message to oncologist to help get this scheduled for further evaluation.  In the meantime, continue Voltaren gel up to 4 times a day, heating/icing, and lidocaine patches.  Advised to alternate Voltaren and patches.  Cough Likely due to malignancy and resultant inflammation.  Also could be component of allergies/viral syndrome/postnasal drip given concurrent congestion.  Reassured by lack of focal findings on exam and stable vital signs.  Recommended warm tea with honey and lemon as well as cetirizine daily as needed though cautioned not to take long term.  Follow-up CT chest with oncology as above.  Arthur Holmes, MD [facility]

## 2022-12-30 NOTE — Assessment & Plan Note (Addendum)
History and exam suggestive of muscular chest wall pain versus true abdominal pathology.  Reassured by lack of systemic symptoms and overall improving status.  Also consider pathologic fracture due to metastatic bone disease, though previous imaging reassuring.  Repeat CT chest ordered and pending; will send message to oncologist to help get this scheduled for further evaluation.  In the meantime, continue Voltaren gel up to 4 times a day, heating/icing, and lidocaine patches.  Advised to alternate Voltaren and patches.

## 2022-12-30 NOTE — Patient Instructions (Signed)
You have costochondritis/chest wall pain syndrome after having a muscle strain with coughing. Continue Voltaren gel up to 4 times a day, heating/icing (whichever feels the best), and the lidocaine patches I gave you.  You can also get more of these over-the-counter. Let me know if this is not getting any better.  I will also message your oncologist to see if we could get your appointment with them sooner as well as your CT chest scheduled for further evaluation.

## 2022-12-30 NOTE — Assessment & Plan Note (Addendum)
Likely due to malignancy and resultant inflammation.  Also could be component of allergies/viral syndrome/postnasal drip given concurrent congestion.  Reassured by lack of focal findings on exam and stable vital signs.  Recommended warm tea with honey and lemon as well as cetirizine daily as needed though cautioned not to take long term.  Follow-up CT chest with oncology as above.

## 2022-12-30 NOTE — Progress Notes (Signed)
Specialty Pharmacy Refill Coordination Note  Arthur [name] is a 82 y.o. male contacted today regarding refills of specialty medication(s) Alectinib Hcl   Patient requested Delivery   Delivery date: 12/31/22   Verified address: [address] [place] [address]   Medication will be filled on 12/30/22.

## 2023-01-01 ENCOUNTER — Telehealth: Payer: Self-pay | Admitting: Medical Oncology

## 2023-01-01 NOTE — Telephone Encounter (Addendum)
Son called to r/s appt on 12/23.  Son requests scan next week and f/u before 12/20 due to not available. He is calling radiology to schedule scan . I told the son that there may not be availability before 12/20.

## 2023-01-01 NOTE — Telephone Encounter (Signed)
Called and spoke with patient and confirmed lab date at 230 on 12/11 before ct scan per in basket message. Order stated follow up with Cassie few days later; patient request f/u be a phone visit. Availability scheduled f/u telephone visit. Patient inquired about 12/23 appt, advise lets wait before cancelling it until after 12/11 appt with Cassie.

## 2023-01-08 ENCOUNTER — Other Ambulatory Visit: Payer: Self-pay | Admitting: Internal Medicine

## 2023-01-08 ENCOUNTER — Inpatient Hospital Stay: Payer: 59 | Attending: Physician Assistant

## 2023-01-08 ENCOUNTER — Ambulatory Visit (HOSPITAL_COMMUNITY)
Admission: RE | Admit: 2023-01-08 | Discharge: 2023-01-08 | Disposition: A | Discharge status: Home / Self Care | Payer: 59 | Source: Ambulatory Visit | Attending: Internal Medicine | Admitting: Internal Medicine

## 2023-01-08 DIAGNOSIS — Z79899 Other long term (current) drug therapy: Secondary | ICD-10-CM | POA: Diagnosis not present

## 2023-01-08 DIAGNOSIS — C3490 Malignant neoplasm of unspecified part of unspecified bronchus or lung: Secondary | ICD-10-CM | POA: Insufficient documentation

## 2023-01-08 DIAGNOSIS — I700 Atherosclerosis of aorta: Secondary | ICD-10-CM | POA: Diagnosis not present

## 2023-01-08 DIAGNOSIS — K861 Other chronic pancreatitis: Secondary | ICD-10-CM | POA: Diagnosis not present

## 2023-01-08 DIAGNOSIS — N200 Calculus of kidney: Secondary | ICD-10-CM | POA: Diagnosis not present

## 2023-01-08 DIAGNOSIS — D649 Anemia, unspecified: Secondary | ICD-10-CM

## 2023-01-08 LAB — CMP (CANCER CENTER ONLY)
ALT: 11 U/L (ref 0–44)
AST: 22 U/L (ref 15–41)
Albumin: 2.7 g/dL — ABNORMAL LOW (ref 3.5–5.0)
Alkaline Phosphatase: 79 U/L (ref 38–126)
Anion gap: 3 — ABNORMAL LOW (ref 5–15)
BUN: 17 mg/dL (ref 8–23)
CO2: 29 mmol/L (ref 22–32)
Calcium: 8 mg/dL — ABNORMAL LOW (ref 8.9–10.3)
Chloride: 107 mmol/L (ref 98–111)
Creatinine: 1.22 mg/dL (ref 0.61–1.24)
GFR, Estimated: 59 mL/min — ABNORMAL LOW (ref >60)
Glucose, Bld: 184 mg/dL — ABNORMAL HIGH (ref 70–99)
Potassium: 4.3 mmol/L (ref 3.5–5.1)
Sodium: 139 mmol/L (ref 135–145)
Total Bilirubin: 1.1 mg/dL (ref <1.2)
Total Protein: 6.4 g/dL — ABNORMAL LOW (ref 6.5–8.1)

## 2023-01-08 LAB — CBC WITH DIFFERENTIAL (CANCER CENTER ONLY)
Abs Immature Granulocytes: 0.05 10*3/uL (ref 0.00–0.07)
Basophils Absolute: 0.1 10*3/uL (ref 0.0–0.1)
Basophils Relative: 1 %
Eosinophils Absolute: 0.3 10*3/uL (ref 0.0–0.5)
Eosinophils Relative: 4 %
HCT: 26.3 % — ABNORMAL LOW (ref 39.0–52.0)
Hemoglobin: 8.9 g/dL — ABNORMAL LOW (ref 13.0–17.0)
Immature Granulocytes: 1 %
Lymphocytes Relative: 24 %
Lymphs Abs: 1.7 10*3/uL (ref 0.7–4.0)
MCH: 28.9 pg (ref 26.0–34.0)
MCHC: 33.8 g/dL (ref 30.0–36.0)
MCV: 85.4 fL (ref 80.0–100.0)
Monocytes Absolute: 0.6 10*3/uL (ref 0.1–1.0)
Monocytes Relative: 9 %
Neutro Abs: 4.4 10*3/uL (ref 1.7–7.7)
Neutrophils Relative %: 61 %
Platelet Count: 442 10*3/uL — ABNORMAL HIGH (ref 150–400)
RBC: 3.08 MIL/uL — ABNORMAL LOW (ref 4.22–5.81)
RDW: 18.4 % — ABNORMAL HIGH (ref 11.5–15.5)
WBC Count: 7.1 10*3/uL (ref 4.0–10.5)
nRBC: 0 % (ref 0.0–0.2)

## 2023-01-08 LAB — IRON AND IRON BINDING CAPACITY (CC-WL,HP ONLY)
Iron: 79 ug/dL (ref 45–182)
Saturation Ratios: 41 % — ABNORMAL HIGH (ref 17.9–39.5)
TIBC: 195 ug/dL — ABNORMAL LOW (ref 250–450)
UIBC: 116 ug/dL — ABNORMAL LOW (ref 117–376)

## 2023-01-08 LAB — SAMPLE TO BLOOD BANK

## 2023-01-09 LAB — FERRITIN: Ferritin: 378 ng/mL — ABNORMAL HIGH (ref 24–336)

## 2023-01-10 ENCOUNTER — Telehealth: Payer: 59 | Admitting: Physician Assistant

## 2023-01-10 ENCOUNTER — Telehealth: Payer: Self-pay | Admitting: Medical Oncology

## 2023-01-10 NOTE — Telephone Encounter (Signed)
Son cancelled appt 12/23. Needs early morning appt. Schedule message sent . CT scan not final.

## 2023-01-16 ENCOUNTER — Telehealth: Payer: Self-pay | Admitting: Physician Assistant

## 2023-01-16 NOTE — Telephone Encounter (Signed)
[name] son is aware of scheduled appointment times/dates

## 2023-01-17 ENCOUNTER — Telehealth: Payer: 59 | Admitting: Physician Assistant

## 2023-01-20 ENCOUNTER — Ambulatory Visit: Payer: 59 | Admitting: Physician Assistant

## 2023-01-21 ENCOUNTER — Other Ambulatory Visit (HOSPITAL_COMMUNITY): Payer: Self-pay

## 2023-01-21 ENCOUNTER — Other Ambulatory Visit: Payer: Self-pay

## 2023-01-21 NOTE — Progress Notes (Signed)
Specialty Pharmacy Refill Coordination Note  Arthur [name] is a 82 y.o. male contacted today regarding refills of specialty medication(s) Alectinib HCl [name]   Patient requested Delivery   Delivery date: 01/27/23   Verified address: [address] [place] [address]   Medication will be filled on 01/24/23.

## 2023-01-24 ENCOUNTER — Other Ambulatory Visit: Payer: Self-pay

## 2023-01-31 ENCOUNTER — Ambulatory Visit: Payer: 59 | Admitting: Family Medicine

## 2023-01-31 ENCOUNTER — Ambulatory Visit (INDEPENDENT_AMBULATORY_CARE_PROVIDER_SITE_OTHER): Payer: 59 | Admitting: Family Medicine

## 2023-01-31 ENCOUNTER — Encounter: Payer: Self-pay | Admitting: Family Medicine

## 2023-01-31 VITALS — BP 131/60 | HR 76 | Ht 71.0 in | Wt 153.4 lb

## 2023-01-31 DIAGNOSIS — G8929 Other chronic pain: Secondary | ICD-10-CM

## 2023-01-31 DIAGNOSIS — M5440 Lumbago with sciatica, unspecified side: Secondary | ICD-10-CM

## 2023-01-31 DIAGNOSIS — I2510 Atherosclerotic heart disease of native coronary artery without angina pectoris: Secondary | ICD-10-CM | POA: Diagnosis not present

## 2023-01-31 MED ORDER — BACLOFEN 10 MG PO TABS: 10.0000 mg | ORAL_TABLET | Freq: Two times a day (BID) | ORAL | 1 refills | Status: DC | PRN

## 2023-01-31 MED ORDER — CLOPIDOGREL BISULFATE 75 MG PO TABS: 75.0000 mg | ORAL_TABLET | Freq: Every day | ORAL | 1 refills | Status: DC

## 2023-01-31 MED ORDER — FUSION PLUS PO CAPS: 1.0000 | ORAL_CAPSULE | Freq: Every day | ORAL | 1 refills | Status: DC

## 2023-01-31 NOTE — Assessment & Plan Note (Signed)
 Intermittent but persistent.  Given his lung cancer recommended LS spine xray if persists or worsens.  They will consider.  Refilled his baclofen.  Reminded not to take NSAIDs give his anemia and recent UGI bleed.

## 2023-01-31 NOTE — Patient Instructions (Addendum)
 Things we discussed today:  Back Pain Use tylenol  and cream and baclofen  as needed.  If not improving or comes back would get xray  Keep taking your 4 medications as ordered    It has been my honor to work with you and your family for these many years - Take Good Care.

## 2023-01-31 NOTE — Progress Notes (Signed)
    SUBJECTIVE:   CHIEF COMPLAINT / HPI:   Overall feels well.  Chest Wall Pain and cough have resolved  R side Back Pain Does have intermittent mild-moderate R sided lower back pain that responds to tylenol  and baclofen  (he has run out).   Is positional.  Does not radiate.  No dysuria or hematuria or fever or new lower extremity weakness (has chronic L foot drop)  CT  01/08/23 IMPRESSION: 1. Right lower lobe 7 mm pulmonary nodule is unchanged. 2. No new or progressive metastasis. 3. New small volume loculated right-sided pleural fluid. 4. Resolved bilateral lower lobe infection. The 6 mm left lower lobe lung nodule has resolved. 5. Mild degradation secondary to motion throughout the lower chest and lack of IV contrast. 6. Similar right inguinal hernia containing nonobstructive small bowel. 7. Aortic valvular calcifications. Consider echocardiography to evaluate for valvular dysfunction. 8. Incidental findings, including: Cholelithiasis. Right nephrolithiasis. Chronic calcific pancreatitis. Coronary artery atherosclerosis. Aortic Atherosclerosis (ICD10-I70.0). Possible constipation.    OBJECTIVE:   BP 131/60   Pulse 76   Ht 5' 11 (1.803 m)   Wt 153 lb 6.4 oz (69.6 kg)   SpO2 100%   BMI 21.39 kg/m   Alert interactive R lower back over paraspinous and sacral area is mildly tender without mass Lungs:  Normal respiratory effort, chest expands symmetrically. Lungs are clear to auscultation, no crackles or wheezes.   ASSESSMENT/PLAN:   Chronic right-sided low back pain with sciatica, sciatica laterality unspecified Assessment & Plan: Intermittent but persistent.  Given his lung cancer recommended LS spine xray if persists or worsens.  They will consider.  Refilled his baclofen .  Reminded not to take NSAIDs give his anemia and recent UGI bleed.    Orders: -     DG Lumbar Spine Complete; Future -     Baclofen ; Take 1 tablet (10 mg total) by mouth 2 (two) times daily as  needed for muscle spasms. (Patient not taking: Reported on 01/31/2023)  Dispense: 30 each; Refill: 1  Coronary artery disease involving native coronary artery of native heart without angina pectoris  Other orders -     Clopidogrel  Bisulfate; Take 1 tablet (75 mg total) by mouth daily with breakfast.  Dispense: 90 tablet; Refill: 1 -     Fusion Plus; Take 1 capsule by mouth daily.  Dispense: 90 capsule; Refill: 1     Patient Instructions  Things we discussed today:  Back Pain Use tylenol  and cream and baclofen  as needed.  If not improving or comes back would get xray  Keep taking your 4 medications as ordered    It has been my honor to work with you and your family for these many years - Take Good Care.     Layman LITTIE Bee, MD [facility]

## 2023-02-09 NOTE — Progress Notes (Signed)
 [facility] OFFICE PROGRESS NOTE  [name], Algona, DO [address] [place] [address]  DIAGNOSIS:  1) Stage IV (T1b, N3, M1 C) non-small cell lung cancer, adenocarcinoma with positive ALK gene translocation diagnosed in February 2023. 2) deep venous thrombosis of the left lower extremity  PRIOR THERAPY: None  CURRENT THERAPY: 1) Alecensa  (Alectinib) 600 mg p.o. twice daily.  First dose April 11, 2021.  Status post 22 months of treatment. 2) Lovenox    INTERVAL HISTORY: Nichola R Silbernagel 83 y.o. male returns to the clinic today for a follow-up visit accompanied by his son. The patient is followed by the clinic for his history of non-small cell lung cancer. He was last seen by Dr. Sherrod on 11/20/22.  He is currently on targeted treatment with Alecensa .  He has been on this since March 2023.  He tolerates it well.   He was seen by his PCPs PA's office a few times in the interval.  He was seen for dry cough, abdominal discomfort, back pain, and chest discomfort.  He also continues to have some poor appetite.  Since being seen, his symptoms have resolved. He states there everything is normal. His only concern today is that he has sweating after eating. He states this occurs even when he is not eating spicy food.   The patient is on lovenox  for his history of DVT and PE. He has IVC filter. He is not symptomatic from his anemia. He denies fatigue. His Hbg typically ranges in the 8-9's. He has been on prednisone  in the past for hemolytic anemia.    He denies any visible bleeding.  He denies any significant fatigue or weakness.  Denies any fever, chills, night sweats, unexplained weight loss.  Denies any chest pain, shortness of breath, cough, or hemoptysis.  Denies any nausea, vomiting, diarrhea, or constipation.  He has stable bilateral lower extremity swelling.  Denies any rashes or skin changes.  Denies any headache or visual changes.  He recently had a restaging CT scan.   He is here today for evaluation repeat blood work.   MEDICAL HISTORY: Past Medical History:  Diagnosis Date   Diabetes mellitus without complication (HCC)    High cholesterol    Hypertension    lung ca    Normal nuclear stress test 01/29/2008   stress perfusion study apparently in 2010 in [facility] which he said was negative.    ALLERGIES:  is allergic to ace inhibitors and iohexol .  MEDICATIONS:  Current Outpatient Medications  Medication Sig Dispense Refill   alectinib (ALECENSA ) 150 MG capsule Take 4 capsules (600 mg total) by mouth 2 (two) times daily with a meal. 240 capsule 3   baclofen  (LIORESAL ) 10 MG tablet Take 1 tablet (10 mg total) by mouth 2 (two) times daily as needed for muscle spasms. (Patient not taking: Reported on 01/31/2023) 30 each 1   benzonatate  (TESSALON ) 100 MG capsule Take 1 capsule (100 mg total) by mouth 2 (two) times daily as needed for cough. (Patient not taking: Reported on 01/31/2023) 30 capsule 1   cetirizine  (ZYRTEC ) 10 MG tablet Take 1 tablet (10 mg total) by mouth daily. (Patient not taking: Reported on 01/31/2023) 30 tablet 0   clopidogrel  (PLAVIX ) 75 MG tablet Take 1 tablet (75 mg total) by mouth daily with breakfast. 90 tablet 1   diclofenac  Sodium (VOLTAREN ) 1 % GEL APPLY 2 GRAMS TOPICALLY FOUR TIMES DAILY 100 g 3   enoxaparin  (LOVENOX ) 80 MG/0.8ML injection Inject 0.7  mLs (70 mg total) into the skin every 12 (twelve) hours. 48 mL 1   furosemide  (LASIX ) 20 MG tablet Take 20 mg by mouth daily as needed for edema (lower extremites). (Patient not taking: Reported on 01/31/2023)     Iron -FA-B Cmp-C-Biot-Probiotic (FUSION PLUS) CAPS Take 1 capsule by mouth daily. 90 capsule 1   lidocaine  (LIDODERM ) 5 % Place 1 patch onto the skin daily. Remove & Discard patch within 12 hours or as directed by MD 14 patch 0   pantoprazole  (PROTONIX ) 40 MG tablet Take 1 tablet (40 mg total) by mouth daily. 90 tablet 1   polyethylene glycol (MIRALAX  / GLYCOLAX ) 17 g  packet Take 17 g by mouth 2 (two) times daily. (Patient not taking: Reported on 01/31/2023)     rosuvastatin  (CRESTOR ) 5 MG tablet Take 1 tablet by mouth once daily 90 tablet 2   senna (SENOKOT) 8.6 MG TABS tablet Take 1 tablet (8.6 mg total) by mouth daily. (Patient not taking: Reported on 01/31/2023) 120 tablet 0   sodium chloride  (OCEAN) 0.65 % SOLN nasal spray Place 1 spray into both nostrils as needed for congestion. 88 mL 1   No current facility-administered medications for this visit.    SURGICAL HISTORY:  Past Surgical History:  Procedure Laterality Date   BIOPSY  10/17/2022   Procedure: BIOPSY;  Surgeon: Stacia Glendia BRAVO, MD;  [facility] ENDOSCOPY;  Service: Gastroenterology;;   CATARACT EXTRACTION, BILATERAL  2006   CORONARY STENT INTERVENTION N/A 02/06/2021   Procedure: CORONARY STENT INTERVENTION;  Surgeon: Dann Candyce RAMAN, MD;  Location: [facility] INVASIVE CV LAB;  Service: Cardiovascular;  Laterality: N/A;   CORONARY ULTRASOUND/IVUS N/A 02/06/2021   Procedure: Intravascular Ultrasound/IVUS;  Surgeon: Dann Candyce RAMAN, MD;  Location: [facility] INVASIVE CV LAB;  Service: Cardiovascular;  Laterality: N/A;   ESOPHAGOGASTRODUODENOSCOPY N/A 10/17/2022   Procedure: ESOPHAGOGASTRODUODENOSCOPY (EGD);  Surgeon: Stacia Glendia BRAVO, MD;  Location: [facility] ENDOSCOPY;  Service: Gastroenterology;  Laterality: N/A;   FINE NEEDLE ASPIRATION  03/21/2021   Procedure: FINE NEEDLE ASPIRATION;  Surgeon: Brenna Adine CROME, DO;  Location: [facility] ENDOSCOPY;  Service: Pulmonary;;   IR RADIOLOGIST EVAL & MGMT  12/24/2021   KNEE SURGERY  2005   LEFT HEART CATH AND CORONARY ANGIOGRAPHY N/A 02/06/2021   Procedure: LEFT HEART CATH AND CORONARY ANGIOGRAPHY;  Surgeon: Dann Candyce RAMAN, MD;  Location: [facility] INVASIVE CV LAB;  Service: Cardiovascular;  Laterality: N/A;   VIDEO BRONCHOSCOPY WITH ENDOBRONCHIAL ULTRASOUND Bilateral 03/21/2021   Procedure: VIDEO BRONCHOSCOPY WITH ENDOBRONCHIAL ULTRASOUND;  Surgeon: Brenna Adine CROME,  DO;  Location: [facility] ENDOSCOPY;  Service: Pulmonary;  Laterality: Bilateral;    REVIEW OF SYSTEMS:   Constitutional: Negative for appetite change, chills, fatigue, fever and unexpected weight change.  HENT: Negative for mouth sores, nosebleeds, sore throat and trouble swallowing.   Eyes: Negative for eye problems and icterus.  Respiratory: Negative for cough, hemoptysis, shortness of breath and wheezing.   Cardiovascular: Negative for chest pain positive for stable leg swelling. Gastrointestinal: Negative for abdominal pain, constipation, diarrhea, nausea and vomiting.  Genitourinary: Negative for bladder incontinence, difficulty urinating, dysuria, frequency and hematuria.   Musculoskeletal: Negative for back pain, gait problem, neck pain and neck stiffness.  Skin: Negative for itching and rash.  Neurological: Negative for dizziness, extremity weakness, gait problem, headaches, light-headedness and seizures.  Hematological: Negative for adenopathy. Does not bruise/bleed easily.  Psychiatric/Behavioral: Negative for confusion, depression and sleep disturbance. The patient is not nervous/anxious.      PHYSICAL EXAMINATION:  Blood pressure  139/66, pulse 100, temperature (!) 97.2 F (36.2 C), temperature source Temporal, resp. rate 16, weight 155 lb 3.2 oz (70.4 kg), SpO2 100%.  ECOG PERFORMANCE STATUS: 1  Physical Exam  Constitutional: Oriented to person, place, and time and well-developed, well-nourished, and in no distress.  HENT:  Head: Normocephalic and atraumatic.  Mouth/Throat: Oropharynx is clear and moist. No oropharyngeal exudate.  Eyes: Conjunctivae are normal. Right eye exhibits no discharge. Left eye exhibits no discharge. No scleral icterus.  Neck: Normal range of motion. Neck supple.  Cardiovascular: Normal rate, regular rhythm, normal heart sounds and intact distal pulses.   Pulmonary/Chest: Effort normal and breath sounds normal. No respiratory distress. No wheezes. No  rales.  Abdominal: Soft. Bowel sounds are normal. Exhibits no distension and no mass. There is no tenderness.  Musculoskeletal: Normal range of motion. Exhibits no edema.  Lymphadenopathy:    No cervical adenopathy.  Neurological: Alert and oriented to person, place, and time. Exhibits muscle wasting. Examined in the wheelchair.  Skin: Skin is warm and dry. No rash noted. Not diaphoretic. No erythema. No pallor.  Psychiatric: Mood, memory and judgment normal.  Vitals reviewed.  LABORATORY DATA: Lab Results  Component Value Date   WBC 6.7 02/11/2023   HGB 8.6 (L) 02/11/2023   HCT 25.7 (L) 02/11/2023   MCV 85.1 02/11/2023   PLT 385 02/11/2023      Chemistry      Component Value Date/Time   NA 143 02/11/2023 1038   NA 141 10/15/2022 1229   K 4.0 02/11/2023 1038   CL 111 02/11/2023 1038   CO2 29 02/11/2023 1038   BUN 16 02/11/2023 1038   BUN 34 (H) 10/15/2022 1229   CREATININE 1.26 (H) 02/11/2023 1038   CREATININE 1.18 11/15/2015 1114      Component Value Date/Time   CALCIUM  7.9 (L) 02/11/2023 1038   ALKPHOS 61 02/11/2023 1038   AST 21 02/11/2023 1038   ALT 12 02/11/2023 1038   BILITOT 1.2 02/11/2023 1038       RADIOGRAPHIC STUDIES:  No results found.   ASSESSMENT/PLAN:  This is a very pleasant 83 year old Indian male diagnosed with stage IV (T1b, N3, M1C) non-small cell lung cancer, adenocarcinoma. The patient has a positive ALK gene translocation. He was diagnosed in February 2023.    The patient is currently undergoing treatment with Alecensa  (Alectinib) 600 mg p.o. twice daily.  He has been tolerating the treatment well with no concerning complaints.  He is status post 22 months of treatment.     The patient was previously hospitalized for worsening anemia Hbg 6.5 from 9.2 two weeks prior). Dr. Sherrod believes this is a combination of IDA, anemia of chronic disease, and hemolytic anemia.   The patient was seen with Dr. Sherrod today.  Dr. Sherrod personally and  independently reviewed the scan and discussed results with the patient today.  The scan showed no evidence of disease progression.  Dr. Sherrod recommends he continue on the same treatment at the same dose.   The patient was seen with Dr. Sherrod today. Labs were reviewed.  Hemoglobin is 8.6 today. He does not need a blood transfusion at this time.      We will see him back for a follow-up visit 2 months for evaluation and repeat labs.   We will add on TSH and T4 due to his concern with sweating after eating.    He will continue his Lovenox  for history of DVT and PE.  His IVC filter is  still in place.  The patient was advised to call immediately if he has any concerning symptoms in the interval. The patient voices understanding of current disease status and treatment options and is in agreement with the current care plan. All questions were answered. The patient knows to call the clinic with any problems, questions or concerns. We can certainly see the patient much sooner if necessary   Orders Placed This Encounter  Procedures   CBC with Differential (Cancer Center Only)    Standing Status:   Future    Expected Date:   04/01/2023    Expiration Date:   02/11/2024   CMP (Cancer Center only)    Standing Status:   Future    Expected Date:   04/01/2023    Expiration Date:   02/11/2024   Lactate dehydrogenase (LDH)    Standing Status:   Future    Expected Date:   04/01/2023    Expiration Date:   02/11/2024   Sample to Blood Bank    Standing Status:   Future    Expected Date:   04/01/2023    Expiration Date:   02/11/2024     Kealan Buchan L Mung Rinker, PA-C 02/11/23  ADDENDUM: Hematology/Oncology Attending: I had a face-to-face encounter with the patient today.  I reviewed his record, lab, scan and recommended his care plan.  This is a very pleasant 83 years old Indian male with history of stage IV non-small cell lung cancer, adenocarcinoma with positive ALK gene translocation diagnosed in  February 2023.  The patient also has a history of extensive deep venous thrombosis of the left lower extremity as well as hemolytic anemia.  He has been on treatment with alectinib 600 mg p.o. twice daily since March 2023 and has been tolerating this treatment fairly well.  He had repeat CT scan of the chest, abdomen and pelvis performed recently.  I personally independently reviewed the scan and discussed the result with the patient today.  His scan showed no concerning findings for disease progression.  I recommended for him to continue his current treatment with Alecensa  (Alectinib) with the same dose. Regarding the deep venous thrombosis and failure of Eliquis  in the past, he will continue his current treatment with Lovenox . For the anemia which is multifactorial including element of hemolysis as well as iron  deficiency, he will continue with the oral iron  tablets and will continue to monitor it closely.  He is currently asymptomatic. The patient will come back for follow-up visit in 2 months for evaluation and repeat blood work. He was advised to call immediately if he has any other concerning symptoms in the interval. The total time spent in the appointment was 30 minutes. Disclaimer: This note was dictated with voice recognition software. Similar sounding words can inadvertently be transcribed and may be missed upon review. Sherrod MARLA Sherrod, MD

## 2023-02-10 ENCOUNTER — Other Ambulatory Visit: Payer: Self-pay | Admitting: Physician Assistant

## 2023-02-10 DIAGNOSIS — C3491 Malignant neoplasm of unspecified part of right bronchus or lung: Secondary | ICD-10-CM

## 2023-02-11 ENCOUNTER — Other Ambulatory Visit: Payer: Self-pay

## 2023-02-11 ENCOUNTER — Inpatient Hospital Stay (HOSPITAL_BASED_OUTPATIENT_CLINIC_OR_DEPARTMENT_OTHER): Payer: 59 | Admitting: Physician Assistant

## 2023-02-11 ENCOUNTER — Inpatient Hospital Stay: Payer: 59 | Attending: Physician Assistant

## 2023-02-11 VITALS — BP 139/66 | HR 100 | Temp 97.2°F | Resp 16 | Wt 155.2 lb

## 2023-02-11 DIAGNOSIS — C3491 Malignant neoplasm of unspecified part of right bronchus or lung: Secondary | ICD-10-CM

## 2023-02-11 DIAGNOSIS — C3490 Malignant neoplasm of unspecified part of unspecified bronchus or lung: Secondary | ICD-10-CM | POA: Insufficient documentation

## 2023-02-11 DIAGNOSIS — Z86718 Personal history of other venous thrombosis and embolism: Secondary | ICD-10-CM | POA: Insufficient documentation

## 2023-02-11 DIAGNOSIS — D649 Anemia, unspecified: Secondary | ICD-10-CM | POA: Diagnosis not present

## 2023-02-11 DIAGNOSIS — Z79620 Long term (current) use of immunosuppressive biologic: Secondary | ICD-10-CM | POA: Diagnosis not present

## 2023-02-11 DIAGNOSIS — Z79899 Other long term (current) drug therapy: Secondary | ICD-10-CM | POA: Diagnosis not present

## 2023-02-11 DIAGNOSIS — Z86711 Personal history of pulmonary embolism: Secondary | ICD-10-CM | POA: Diagnosis not present

## 2023-02-11 DIAGNOSIS — D509 Iron deficiency anemia, unspecified: Secondary | ICD-10-CM | POA: Diagnosis not present

## 2023-02-11 DIAGNOSIS — Z7902 Long term (current) use of antithrombotics/antiplatelets: Secondary | ICD-10-CM | POA: Diagnosis not present

## 2023-02-11 DIAGNOSIS — D589 Hereditary hemolytic anemia, unspecified: Secondary | ICD-10-CM | POA: Diagnosis not present

## 2023-02-11 LAB — CBC WITH DIFFERENTIAL (CANCER CENTER ONLY)
Abs Immature Granulocytes: 0.06 10*3/uL (ref 0.00–0.07)
Basophils Absolute: 0.1 10*3/uL (ref 0.0–0.1)
Basophils Relative: 1 %
Eosinophils Absolute: 0.3 10*3/uL (ref 0.0–0.5)
Eosinophils Relative: 4 %
HCT: 25.7 % — ABNORMAL LOW (ref 39.0–52.0)
Hemoglobin: 8.6 g/dL — ABNORMAL LOW (ref 13.0–17.0)
Immature Granulocytes: 1 %
Lymphocytes Relative: 24 %
Lymphs Abs: 1.6 10*3/uL (ref 0.7–4.0)
MCH: 28.5 pg (ref 26.0–34.0)
MCHC: 33.5 g/dL (ref 30.0–36.0)
MCV: 85.1 fL (ref 80.0–100.0)
Monocytes Absolute: 0.6 10*3/uL (ref 0.1–1.0)
Monocytes Relative: 8 %
Neutro Abs: 4.2 10*3/uL (ref 1.7–7.7)
Neutrophils Relative %: 62 %
Platelet Count: 385 10*3/uL (ref 150–400)
RBC: 3.02 MIL/uL — ABNORMAL LOW (ref 4.22–5.81)
RDW: 18.6 % — ABNORMAL HIGH (ref 11.5–15.5)
WBC Count: 6.7 10*3/uL (ref 4.0–10.5)
nRBC: 0 % (ref 0.0–0.2)

## 2023-02-11 LAB — LACTATE DEHYDROGENASE: LDH: 194 U/L — ABNORMAL HIGH (ref 98–192)

## 2023-02-11 LAB — CMP (CANCER CENTER ONLY)
ALT: 12 U/L (ref 0–44)
AST: 21 U/L (ref 15–41)
Albumin: 2.7 g/dL — ABNORMAL LOW (ref 3.5–5.0)
Alkaline Phosphatase: 61 U/L (ref 38–126)
Anion gap: 3 — ABNORMAL LOW (ref 5–15)
BUN: 16 mg/dL (ref 8–23)
CO2: 29 mmol/L (ref 22–32)
Calcium: 7.9 mg/dL — ABNORMAL LOW (ref 8.9–10.3)
Chloride: 111 mmol/L (ref 98–111)
Creatinine: 1.26 mg/dL — ABNORMAL HIGH (ref 0.61–1.24)
GFR, Estimated: 57 mL/min — ABNORMAL LOW (ref >60)
Glucose, Bld: 198 mg/dL — ABNORMAL HIGH (ref 70–99)
Potassium: 4 mmol/L (ref 3.5–5.1)
Sodium: 143 mmol/L (ref 135–145)
Total Bilirubin: 1.2 mg/dL (ref 0.0–1.2)
Total Protein: 5.9 g/dL — ABNORMAL LOW (ref 6.5–8.1)

## 2023-02-11 LAB — SAMPLE TO BLOOD BANK

## 2023-02-11 LAB — TSH: TSH: 4.036 u[IU]/mL (ref 0.350–4.500)

## 2023-02-12 LAB — T4: T4, Total: 7.9 ug/dL (ref 4.5–12.0)

## 2023-02-14 ENCOUNTER — Other Ambulatory Visit: Payer: Self-pay

## 2023-02-19 ENCOUNTER — Other Ambulatory Visit: Payer: Self-pay

## 2023-02-19 ENCOUNTER — Other Ambulatory Visit (HOSPITAL_COMMUNITY): Payer: Self-pay

## 2023-02-19 NOTE — Progress Notes (Signed)
Specialty Pharmacy Refill Coordination Note  Arthur [name] is a 83 y.o. male contacted today regarding refills of specialty medication(s) Alectinib HCl [name]   Patient requested Delivery   Delivery date:  (medication will be delivered 2.3 - son aware we cannot deliver on sundays)   Verified address: [address] [place] [address]   Medication will be filled on 01.31.25.

## 2023-02-28 ENCOUNTER — Other Ambulatory Visit: Payer: Self-pay

## 2023-03-03 ENCOUNTER — Other Ambulatory Visit (HOSPITAL_COMMUNITY): Payer: Self-pay

## 2023-03-10 DIAGNOSIS — H02821 Cysts of right upper eyelid: Secondary | ICD-10-CM | POA: Diagnosis not present

## 2023-03-10 DIAGNOSIS — H26492 Other secondary cataract, left eye: Secondary | ICD-10-CM | POA: Diagnosis not present

## 2023-03-10 DIAGNOSIS — E119 Type 2 diabetes mellitus without complications: Secondary | ICD-10-CM | POA: Diagnosis not present

## 2023-03-10 LAB — HM DIABETES EYE EXAM

## 2023-03-26 ENCOUNTER — Other Ambulatory Visit: Payer: Self-pay | Admitting: Internal Medicine

## 2023-03-26 ENCOUNTER — Other Ambulatory Visit: Payer: Self-pay

## 2023-03-26 MED ORDER — ALECENSA 150 MG PO CAPS
600.0000 mg | ORAL_CAPSULE | Freq: Two times a day (BID) | ORAL | 3 refills | Status: DC
  Filled 2023-03-26: qty 240, 30d supply, fill #0
  Filled 2023-04-22: qty 240, 30d supply, fill #1
  Filled 2023-05-19: qty 240, 30d supply, fill #2
  Filled 2023-06-24: qty 240, 30d supply, fill #3

## 2023-03-26 NOTE — Progress Notes (Signed)
 Specialty Pharmacy Refill Coordination Note  Arthur [name] is a 83 y.o. male contacted today regarding refills of specialty medication(s) Alectinib HCl [name]   Patient requested Delivery   Delivery date: 03/31/23   Verified address: [address] [place] [address]   Medication will be filled on 03/28/23, pending refill approval.     This fill date is pending response to refill request from provider. Patient is aware and if they have not received fill by intended date they must follow up with pharmacy.

## 2023-03-28 ENCOUNTER — Other Ambulatory Visit: Payer: Self-pay

## 2023-04-10 ENCOUNTER — Inpatient Hospital Stay (HOSPITAL_BASED_OUTPATIENT_CLINIC_OR_DEPARTMENT_OTHER): Payer: 59 | Admitting: Internal Medicine

## 2023-04-10 ENCOUNTER — Inpatient Hospital Stay: Payer: 59 | Attending: Physician Assistant

## 2023-04-10 VITALS — BP 130/57 | HR 94 | Temp 98.1°F | Resp 16 | Ht 71.0 in | Wt 155.4 lb

## 2023-04-10 DIAGNOSIS — T451X5A Adverse effect of antineoplastic and immunosuppressive drugs, initial encounter: Secondary | ICD-10-CM | POA: Diagnosis not present

## 2023-04-10 DIAGNOSIS — Z7901 Long term (current) use of anticoagulants: Secondary | ICD-10-CM | POA: Diagnosis not present

## 2023-04-10 DIAGNOSIS — Z79899 Other long term (current) drug therapy: Secondary | ICD-10-CM | POA: Insufficient documentation

## 2023-04-10 DIAGNOSIS — C3490 Malignant neoplasm of unspecified part of unspecified bronchus or lung: Secondary | ICD-10-CM | POA: Insufficient documentation

## 2023-04-10 DIAGNOSIS — D6481 Anemia due to antineoplastic chemotherapy: Secondary | ICD-10-CM | POA: Diagnosis not present

## 2023-04-10 DIAGNOSIS — Z86718 Personal history of other venous thrombosis and embolism: Secondary | ICD-10-CM | POA: Insufficient documentation

## 2023-04-10 DIAGNOSIS — C3491 Malignant neoplasm of unspecified part of right bronchus or lung: Secondary | ICD-10-CM

## 2023-04-10 DIAGNOSIS — D649 Anemia, unspecified: Secondary | ICD-10-CM

## 2023-04-10 LAB — CMP (CANCER CENTER ONLY)
ALT: 11 U/L (ref 0–44)
AST: 19 U/L (ref 15–41)
Albumin: 2.8 g/dL — ABNORMAL LOW (ref 3.5–5.0)
Alkaline Phosphatase: 59 U/L (ref 38–126)
Anion gap: 2 — ABNORMAL LOW (ref 5–15)
BUN: 15 mg/dL (ref 8–23)
CO2: 31 mmol/L (ref 22–32)
Calcium: 7.8 mg/dL — ABNORMAL LOW (ref 8.9–10.3)
Chloride: 109 mmol/L (ref 98–111)
Creatinine: 1.15 mg/dL (ref 0.61–1.24)
GFR, Estimated: >60 mL/min (ref >60)
Glucose, Bld: 108 mg/dL — ABNORMAL HIGH (ref 70–99)
Potassium: 4.2 mmol/L (ref 3.5–5.1)
Sodium: 142 mmol/L (ref 135–145)
Total Bilirubin: 1.1 mg/dL (ref 0.0–1.2)
Total Protein: 6.1 g/dL — ABNORMAL LOW (ref 6.5–8.1)

## 2023-04-10 LAB — CBC WITH DIFFERENTIAL (CANCER CENTER ONLY)
Abs Immature Granulocytes: 0.07 10*3/uL (ref 0.00–0.07)
Basophils Absolute: 0.1 10*3/uL (ref 0.0–0.1)
Basophils Relative: 1 %
Eosinophils Absolute: 0.2 10*3/uL (ref 0.0–0.5)
Eosinophils Relative: 2 %
HCT: 26.6 % — ABNORMAL LOW (ref 39.0–52.0)
Hemoglobin: 8.7 g/dL — ABNORMAL LOW (ref 13.0–17.0)
Immature Granulocytes: 1 %
Lymphocytes Relative: 20 %
Lymphs Abs: 1.7 10*3/uL (ref 0.7–4.0)
MCH: 28.7 pg (ref 26.0–34.0)
MCHC: 32.7 g/dL (ref 30.0–36.0)
MCV: 87.8 fL (ref 80.0–100.0)
Monocytes Absolute: 0.6 10*3/uL (ref 0.1–1.0)
Monocytes Relative: 8 %
Neutro Abs: 5.6 10*3/uL (ref 1.7–7.7)
Neutrophils Relative %: 68 %
Platelet Count: 368 10*3/uL (ref 150–400)
RBC: 3.03 MIL/uL — ABNORMAL LOW (ref 4.22–5.81)
RDW: 18.1 % — ABNORMAL HIGH (ref 11.5–15.5)
WBC Count: 8.1 10*3/uL (ref 4.0–10.5)
nRBC: 0 % (ref 0.0–0.2)

## 2023-04-10 LAB — SAMPLE TO BLOOD BANK

## 2023-04-10 LAB — LACTATE DEHYDROGENASE: LDH: 215 U/L — ABNORMAL HIGH (ref 98–192)

## 2023-04-10 NOTE — Progress Notes (Signed)
 [facility] [phone]   [phone]  OFFICE PROGRESS NOTE  [name], Tamala Julian, DO [address] [place] [address]  DIAGNOSIS:  1) Stage IV (T1b, N3, M1 C) non-small cell lung cancer, adenocarcinoma with positive ALK gene translocation diagnosed in February 2023. 2) deep venous thrombosis of the left lower extremity  PRIOR THERAPY: None  CURRENT THERAPY:  1) Alecensa (Alectinib) 600 mg p.o. twice daily.  First dose April 11, 2021.  Status post 20 months of treatment. 2) Eliquis 2.5 mg p.o. twice daily.  First dose 10/11/2021.    INTERVAL HISTORY: Arthur [name] 83 y.o. male to the clinic today for follow-up visit accompanied by his son. Discussed the use of AI scribe software for clinical note transcription with the patient, who gave verbal consent to proceed.  History of Present Illness   The patient is an 83 year old with stage four non-small cell lung cancer adenocarcinoma who presents for follow-up. He is accompanied by his son.  He was diagnosed with stage four non-small cell lung cancer adenocarcinoma in February 2023, with a positive ALK gene translocation. He has been on treatment with Alectinib, 600 mg twice a day, since April 11, 2021. He feels better overall and mentions an increased appetite, stating 'I eat too much.' He also notes drinking a lot of water at times, although he sometimes dislikes it. His weight is stable at 155.4 pounds.  He has a history of anemia, with a current hemoglobin level of 8.7, slightly improved from 8.6 at the last visit. No fatigue or weakness, indicating that his body has adjusted to the anemia. He is currently taking prednisone 10 mg daily.  He has a history of deep venous thrombosis and is currently on Lovenox for anticoagulation. He has difficulty finding someone to administer the injections, especially when traveling or during nighttime. He is considering switching back to Eliquis, which he  previously used but had issues with bleeding.        MEDICAL HISTORY: Past Medical History:  Diagnosis Date   Diabetes mellitus without complication (HCC)    High cholesterol    Hypertension    lung ca    Normal nuclear stress test 01/29/2008   stress perfusion study apparently in 2010 in [facility] which he said was negative.    ALLERGIES:  is allergic to ace inhibitors and iohexol.  MEDICATIONS:  Current Outpatient Medications  Medication Sig Dispense Refill   alectinib (ALECENSA) 150 MG capsule Take 4 capsules (600 mg total) by mouth 2 (two) times daily with a meal. 240 capsule 3   baclofen (LIORESAL) 10 MG tablet Take 1 tablet (10 mg total) by mouth 2 (two) times daily as needed for muscle spasms. (Patient not taking: Reported on 01/31/2023) 30 each 1   benzonatate (TESSALON) 100 MG capsule Take 1 capsule (100 mg total) by mouth 2 (two) times daily as needed for cough. (Patient not taking: Reported on 01/31/2023) 30 capsule 1   cetirizine (ZYRTEC) 10 MG tablet Take 1 tablet (10 mg total) by mouth daily. (Patient not taking: Reported on 01/31/2023) 30 tablet 0   clopidogrel (PLAVIX) 75 MG tablet Take 1 tablet (75 mg total) by mouth daily with breakfast. 90 tablet 1   diclofenac Sodium (VOLTAREN) 1 % GEL APPLY 2 GRAMS TOPICALLY FOUR TIMES DAILY 100 g 3   enoxaparin (LOVENOX) 80 MG/0.8ML injection Inject 0.7 mLs (70 mg total) into the skin every 12 (twelve) hours. 48 mL 1  furosemide (LASIX) 20 MG tablet Take 20 mg by mouth daily as needed for edema (lower extremites). (Patient not taking: Reported on 01/31/2023)     Iron-FA-B Cmp-C-Biot-Probiotic (FUSION PLUS) CAPS Take 1 capsule by mouth daily. 90 capsule 1   lidocaine (LIDODERM) 5 % Place 1 patch onto the skin daily. Remove & Discard patch within 12 hours or as directed by MD 14 patch 0   pantoprazole (PROTONIX) 40 MG tablet Take 1 tablet (40 mg total) by mouth daily. 90 tablet 1   polyethylene glycol (MIRALAX / GLYCOLAX) 17 g  packet Take 17 g by mouth 2 (two) times daily. (Patient not taking: Reported on 01/31/2023)     rosuvastatin (CRESTOR) 5 MG tablet Take 1 tablet by mouth once daily 90 tablet 2   senna (SENOKOT) 8.6 MG TABS tablet Take 1 tablet (8.6 mg total) by mouth daily. (Patient not taking: Reported on 01/31/2023) 120 tablet 0   sodium chloride (OCEAN) 0.65 % SOLN nasal spray Place 1 spray into both nostrils as needed for congestion. 88 mL 1   No current facility-administered medications for this visit.    SURGICAL HISTORY:  Past Surgical History:  Procedure Laterality Date   BIOPSY  10/17/2022   Procedure: BIOPSY;  Surgeon: Jenel Lucks, MD;  Location: [facility] ENDOSCOPY;  Service: Gastroenterology;;   CATARACT EXTRACTION, BILATERAL  2006   CORONARY STENT INTERVENTION N/A 02/06/2021   Procedure: CORONARY STENT INTERVENTION;  Surgeon: Corky Crafts, MD;  Location: [facility] INVASIVE CV LAB;  Service: Cardiovascular;  Laterality: N/A;   CORONARY ULTRASOUND/IVUS N/A 02/06/2021   Procedure: Intravascular Ultrasound/IVUS;  Surgeon: Corky Crafts, MD;  [facility] INVAS[facility] CV LAB;  Service: Cardiovascular;  Laterality: N/A;   ESOPHAGOGASTRODUODENOSCOPY N/A 10/17/2022   Procedure: ESOPHAGOGASTRODUODENOSCOPY (EGD);  Surgeon: Jenel Lucks, MD;  Location: [facility] ENDOSCOPY;  Service: Gastroenterology;  Laterality: N/A;   FINE NEEDLE ASPIRATION  03/21/2021   Procedure: FINE NEEDLE ASPIRATION;  Surgeon: Josephine Igo, DO;  Location: [facility] ENDOSCOPY;  Service: Pulmonary;;   IR RADIOLOGIST EVAL & MGMT  12/24/2021   KNEE SURGERY  2005   LEFT HEART CATH AND CORONARY ANGIOGRAPHY N/A 02/06/2021   Procedure: LEFT HEART CATH AND CORONARY ANGIOGRAPHY;  Surgeon: Corky Crafts, MD;  Location: [facility] INVASIVE CV LAB;  Service: Cardiovascular;  Laterality: N/A;   VIDEO BRONCHOSCOPY WITH ENDOBRONCHIAL ULTRASOUND Bilateral 03/21/2021   Procedure: VIDEO BRONCHOSCOPY WITH ENDOBRONCHIAL ULTRASOUND;  Surgeon: Josephine Igo,  DO;  Location: [facility] ENDOSCOPY;  Service: Pulmonary;  Laterality: Bilateral;    REVIEW OF SYSTEMS:  Constitutional: positive for fatigue Eyes: negative Ears, nose, mouth, throat, and face: negative Respiratory: negative Cardiovascular: negative Gastrointestinal: positive for dyspepsia Genitourinary:negative Integument/breast: negative Hematologic/lymphatic: negative Musculoskeletal:negative Neurological: negative Behavioral/Psych: negative Endocrine: negative Allergic/Immunologic: negative   PHYSICAL EXAMINATION: General appearance: alert, cooperative, and no distress Head: Normocephalic, without obvious abnormality, atraumatic Neck: no adenopathy, no JVD, supple, symmetrical, trachea midline, and thyroid not enlarged, symmetric, no tenderness/mass/nodules Lymph nodes: Cervical, supraclavicular, and axillary nodes normal. Resp: clear to auscultation bilaterally Back: symmetric, no curvature. ROM normal. No CVA tenderness. Cardio: regular rate and rhythm, S1, S2 normal, no murmur, click, rub or gallop GI: soft, non-tender; bowel sounds normal; no masses,  no organomegaly Extremities: edema trace edema in the left lower extremity Neurologic: Alert and oriented X 3, normal strength and tone. Normal symmetric reflexes. Normal coordination and gait  ECOG PERFORMANCE STATUS: 1 - Symptomatic but completely ambulatory  Blood pressure (!) 130/57, pulse 94, temperature 98.1 F (36.7  C), temperature source Temporal, resp. rate 16, height 5\' 11"  (1.803 m), weight 155 lb 6.4 oz (70.5 kg), SpO2 100%.  LABORATORY DATA: Lab Results  Component Value Date   WBC 8.1 04/10/2023   HGB 8.7 (L) 04/10/2023   HCT 26.6 (L) 04/10/2023   MCV 87.8 04/10/2023   PLT 368 04/10/2023      Chemistry      Component Value Date/Time   NA 142 04/10/2023 1057   NA 141 10/15/2022 1229   K 4.2 04/10/2023 1057   CL 109 04/10/2023 1057   CO2 31 04/10/2023 1057   BUN 15 04/10/2023 1057   BUN 34 (H) 10/15/2022  1229   CREATININE 1.15 04/10/2023 1057   CREATININE 1.18 11/15/2015 1114      Component Value Date/Time   CALCIUM 7.8 (L) 04/10/2023 1057   ALKPHOS 59 04/10/2023 1057   AST 19 04/10/2023 1057   ALT 11 04/10/2023 1057   BILITOT 1.1 04/10/2023 1057       RADIOGRAPHIC STUDIES: No results found.  ASSESSMENT AND PLAN: This is a very pleasant 83 years old [place] male diagnosed with Stage IV (T1b, N3, M1 C) non-small cell lung cancer, adenocarcinoma with positive ALK gene translocation in February 2023. The patient is currently undergoing treatment with Alecensa (Alectinib) 600 mg p.o. twice daily.  He has been tolerating the treatment well with no concerning complaints.  He is status post 24 months of treatment.   The patient has been tolerating his treatment with Alecensa (Alectinib) fairly well.    Stage IV non-small cell lung cancer (adenocarcinoma) with ALK gene translocation Diagnosed in February 2023. Currently on alectinib 600 mg twice daily since April 11, 2021. Reports improvement and stable weight at 155.4 lbs. No significant fatigue or weakness. Hemoglobin remains low at 8.7, consistent with previous levels. Alectinib is known to cause hemolytic anemia, managed with prednisone 10 mg daily. Consider lorlatinib if cancer progresses or anemia worsens. Alectinib will continue as long as effective and tolerable. A scan is planned in 4-6 weeks to assess cancer status. 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 Managed with prednisone 10 mg daily. Consider lorlatinib if anemia becomes severe or cancer progresses. - Continue prednisone 10 mg daily - Monitor hemoglobin levels - Consider switching to lorlatinib if anemia worsens  Deep venous thrombosis (DVT) On Lovenox for anticoagulation due to DVT and cancer.  Difficulty with injections due to lack of available personnel. Discussed switching to Eliquis 2.5 mg twice daily if Lovenox administration becomes too challenging. Previous issues with Eliquis included bleeding and ineffectiveness, but may retry if condition improves. Injections preferred due to better efficacy in cancer patients, but oral anticoagulants like Eliquis may be considered if administration challenges persist. - Continue Lovenox until supply runs out - Switch to Eliquis 2.5 mg twice daily if Lovenox administration becomes too challenging - Monitor for any bleeding or adverse effects with Eliquis  Follow-up Follow-up planned to assess cancer status and treatment efficacy. - Schedule follow-up appointment in 6 weeks - Order scan 1 week prior to follow-up appointment   The patient was advised to call immediately if he has any other concerning symptoms in the interval.   All questions were answered. The patient knows to call the clinic with any problems, questions or concerns. We can certainly see the patient  much sooner if necessary.  The total time spent in the appointment was 30 minutes.  Disclaimer: This note was dictated with voice recognition software. Similar sounding words can inadvertently be transcribed and may not be corrected upon review.

## 2023-04-22 ENCOUNTER — Other Ambulatory Visit: Payer: Self-pay

## 2023-04-22 NOTE — Progress Notes (Signed)
 Specialty Pharmacy Refill Coordination Note  Arthur [name] is a 83 y.o. male contacted today regarding refills of specialty medication(s) Alectinib HCl [name]   Patient requested (Patient-Rptd) Delivery   Delivery date: (Patient-Rptd) 04/24/23   Verified address: (Patient-Rptd) [address] [place] [address]   Medication will be filled on 04/23/23.

## 2023-05-13 ENCOUNTER — Other Ambulatory Visit: Payer: Self-pay | Admitting: Family Medicine

## 2023-05-13 ENCOUNTER — Other Ambulatory Visit: Payer: Self-pay

## 2023-05-17 ENCOUNTER — Other Ambulatory Visit: Payer: Self-pay | Admitting: Family Medicine

## 2023-05-17 DIAGNOSIS — I2510 Atherosclerotic heart disease of native coronary artery without angina pectoris: Secondary | ICD-10-CM

## 2023-05-19 ENCOUNTER — Other Ambulatory Visit: Payer: Self-pay

## 2023-05-19 ENCOUNTER — Other Ambulatory Visit (HOSPITAL_COMMUNITY): Payer: Self-pay

## 2023-05-19 NOTE — Progress Notes (Signed)
 Specialty Pharmacy Refill Coordination Note  Arthur [name] is a 83 y.o. male contacted today regarding refills of specialty medication(s) Alectinib HCl (Alecensa )   Patient requested (Patient-Rptd) Delivery   Delivery date: (Patient-Rptd) 05/20/23   Verified address: (Patient-Rptd) [address] [place] [address]   Medication will be filled on 05/19/23.

## 2023-05-27 ENCOUNTER — Inpatient Hospital Stay: Attending: Physician Assistant

## 2023-05-27 ENCOUNTER — Ambulatory Visit (HOSPITAL_COMMUNITY)
Admission: RE | Admit: 2023-05-27 | Discharge: 2023-05-27 | Disposition: A | Discharge status: Home / Self Care | Source: Ambulatory Visit | Attending: Internal Medicine | Admitting: Internal Medicine

## 2023-05-27 DIAGNOSIS — D630 Anemia in neoplastic disease: Secondary | ICD-10-CM | POA: Diagnosis not present

## 2023-05-27 DIAGNOSIS — C3490 Malignant neoplasm of unspecified part of unspecified bronchus or lung: Secondary | ICD-10-CM | POA: Insufficient documentation

## 2023-05-27 DIAGNOSIS — K4090 Unilateral inguinal hernia, without obstruction or gangrene, not specified as recurrent: Secondary | ICD-10-CM | POA: Diagnosis not present

## 2023-05-27 DIAGNOSIS — J90 Pleural effusion, not elsewhere classified: Secondary | ICD-10-CM | POA: Diagnosis not present

## 2023-05-27 DIAGNOSIS — D649 Anemia, unspecified: Secondary | ICD-10-CM

## 2023-05-27 DIAGNOSIS — N281 Cyst of kidney, acquired: Secondary | ICD-10-CM | POA: Diagnosis not present

## 2023-05-27 DIAGNOSIS — Z85118 Personal history of other malignant neoplasm of bronchus and lung: Secondary | ICD-10-CM | POA: Diagnosis not present

## 2023-05-27 DIAGNOSIS — R918 Other nonspecific abnormal finding of lung field: Secondary | ICD-10-CM | POA: Diagnosis not present

## 2023-05-27 LAB — IRON AND IRON BINDING CAPACITY (CC-WL,HP ONLY)
Iron: 105 ug/dL (ref 45–182)
Saturation Ratios: 48 % — ABNORMAL HIGH (ref 17.9–39.5)
TIBC: 220 ug/dL — ABNORMAL LOW (ref 250–450)
UIBC: 115 ug/dL — ABNORMAL LOW (ref 117–376)

## 2023-05-27 LAB — CBC WITH DIFFERENTIAL (CANCER CENTER ONLY)
Abs Immature Granulocytes: 0.06 10*3/uL (ref 0.00–0.07)
Basophils Absolute: 0.1 10*3/uL (ref 0.0–0.1)
Basophils Relative: 1 %
Eosinophils Absolute: 0.1 10*3/uL (ref 0.0–0.5)
Eosinophils Relative: 2 %
HCT: 25.8 % — ABNORMAL LOW (ref 39.0–52.0)
Hemoglobin: 8.6 g/dL — ABNORMAL LOW (ref 13.0–17.0)
Immature Granulocytes: 1 %
Lymphocytes Relative: 23 %
Lymphs Abs: 1.5 10*3/uL (ref 0.7–4.0)
MCH: 28.6 pg (ref 26.0–34.0)
MCHC: 33.3 g/dL (ref 30.0–36.0)
MCV: 85.7 fL (ref 80.0–100.0)
Monocytes Absolute: 0.5 10*3/uL (ref 0.1–1.0)
Monocytes Relative: 8 %
Neutro Abs: 4.3 10*3/uL (ref 1.7–7.7)
Neutrophils Relative %: 65 %
Platelet Count: 368 10*3/uL (ref 150–400)
RBC: 3.01 MIL/uL — ABNORMAL LOW (ref 4.22–5.81)
RDW: 18.3 % — ABNORMAL HIGH (ref 11.5–15.5)
WBC Count: 6.6 10*3/uL (ref 4.0–10.5)
nRBC: 0.3 % — ABNORMAL HIGH (ref 0.0–0.2)

## 2023-05-27 LAB — CMP (CANCER CENTER ONLY)
ALT: 12 U/L (ref 0–44)
AST: 21 U/L (ref 15–41)
Albumin: 2.8 g/dL — ABNORMAL LOW (ref 3.5–5.0)
Alkaline Phosphatase: 50 U/L (ref 38–126)
Anion gap: 4 — ABNORMAL LOW (ref 5–15)
BUN: 13 mg/dL (ref 8–23)
CO2: 29 mmol/L (ref 22–32)
Calcium: 8.1 mg/dL — ABNORMAL LOW (ref 8.9–10.3)
Chloride: 109 mmol/L (ref 98–111)
Creatinine: 1.11 mg/dL (ref 0.61–1.24)
GFR, Estimated: >60 mL/min (ref >60)
Glucose, Bld: 118 mg/dL — ABNORMAL HIGH (ref 70–99)
Potassium: 3.9 mmol/L (ref 3.5–5.1)
Sodium: 142 mmol/L (ref 135–145)
Total Bilirubin: 1 mg/dL (ref 0.0–1.2)
Total Protein: 5.9 g/dL — ABNORMAL LOW (ref 6.5–8.1)

## 2023-05-27 LAB — SAMPLE TO BLOOD BANK

## 2023-05-27 LAB — FERRITIN: Ferritin: 419 ng/mL — ABNORMAL HIGH (ref 24–336)

## 2023-05-31 ENCOUNTER — Other Ambulatory Visit: Payer: Self-pay | Admitting: Student

## 2023-05-31 ENCOUNTER — Telehealth: Payer: Self-pay | Admitting: Family Medicine

## 2023-05-31 ENCOUNTER — Other Ambulatory Visit (HOSPITAL_COMMUNITY): Payer: Self-pay

## 2023-05-31 ENCOUNTER — Other Ambulatory Visit: Payer: Self-pay | Admitting: Family Medicine

## 2023-05-31 DIAGNOSIS — I2699 Other pulmonary embolism without acute cor pulmonale: Secondary | ICD-10-CM

## 2023-05-31 MED ORDER — ENOXAPARIN SODIUM 80 MG/0.8ML IJ SOSY: 70.0000 mg | PREFILLED_SYRINGE | Freq: Two times a day (BID) | INTRAMUSCULAR | 0 refills | Status: DC

## 2023-05-31 NOTE — Progress Notes (Signed)
 Called spoke to pharmacist who had a question about his Lovenox .  Medication will be dispensed as prescribed.  Clem Currier, DO [facility], PGY-2 05/31/23 6:11 PM

## 2023-05-31 NOTE — Telephone Encounter (Signed)
 Phone call from patient's daughter to my cell (they have number from when I was PCP)  He is about to run out of lovenox  and have been unable to contact the office.  Pharmacy reported to them they faxed a refill request last week but did not receive renewal. .  I refilled Lovenox  for 2 week supply. He has appointment with Dr Becki Bouton next week   Overall he is doing well.

## 2023-06-05 ENCOUNTER — Inpatient Hospital Stay: Attending: Physician Assistant | Admitting: Internal Medicine

## 2023-06-05 ENCOUNTER — Encounter: Payer: Self-pay | Admitting: Family Medicine

## 2023-06-05 ENCOUNTER — Ambulatory Visit: Admitting: Family Medicine

## 2023-06-05 VITALS — BP 134/60 | HR 95

## 2023-06-05 DIAGNOSIS — D6481 Anemia due to antineoplastic chemotherapy: Secondary | ICD-10-CM

## 2023-06-05 DIAGNOSIS — C3491 Malignant neoplasm of unspecified part of right bronchus or lung: Secondary | ICD-10-CM | POA: Diagnosis not present

## 2023-06-05 DIAGNOSIS — Z131 Encounter for screening for diabetes mellitus: Secondary | ICD-10-CM

## 2023-06-05 DIAGNOSIS — I2699 Other pulmonary embolism without acute cor pulmonale: Secondary | ICD-10-CM | POA: Diagnosis not present

## 2023-06-05 DIAGNOSIS — K269 Duodenal ulcer, unspecified as acute or chronic, without hemorrhage or perforation: Secondary | ICD-10-CM

## 2023-06-05 DIAGNOSIS — E782 Mixed hyperlipidemia: Secondary | ICD-10-CM | POA: Diagnosis not present

## 2023-06-05 DIAGNOSIS — D539 Nutritional anemia, unspecified: Secondary | ICD-10-CM

## 2023-06-05 LAB — POCT GLYCOSYLATED HEMOGLOBIN (HGB A1C): HbA1c, POC (controlled diabetic range): 4.9 % (ref 0.0–7.0)

## 2023-06-05 MED ORDER — PANTOPRAZOLE SODIUM 40 MG PO TBEC: 40.0000 mg | DELAYED_RELEASE_TABLET | Freq: Every day | ORAL | 3 refills | Status: DC

## 2023-06-05 MED ORDER — ENOXAPARIN SODIUM 80 MG/0.8ML IJ SOSY: 70.0000 mg | PREFILLED_SYRINGE | Freq: Two times a day (BID) | INTRAMUSCULAR | 3 refills | Status: DC

## 2023-06-05 NOTE — Patient Instructions (Signed)
 Good to see you today - Thank you for coming in  Things we discussed today: I have refilled your medications.  You should have plenty of Lovenox  refills now.  If you have any issues please message me. Try to get up and move around is much as you are able to   Please always bring your medication bottles  Come back to see me in 3 months

## 2023-06-05 NOTE — Assessment & Plan Note (Signed)
 Asymptomatic on daily PPI, will continue while on anticoagulation Refilled protonix  40mg 

## 2023-06-05 NOTE — Progress Notes (Signed)
    SUBJECTIVE:   CHIEF COMPLAINT / HPI:   Presents today for regular checkup with his son.  Needs refills on Protonix  and Lovenox . No concerns today.  He met with Dr. Liam Redhead this morning who made no changes to his medication regimen. He gets up and walks as he is able to with his foot drop.  PERTINENT  PMH / PSH: Hypertension, pulmonary embolus, CAD, cerebral embolism with cerebral infarction, DVT, A-fib, stage IV adenocarcinoma of right lung, GERD, type 2 diabetes  OBJECTIVE:   BP 134/60   Pulse 95   SpO2 99%   GEN: No acute distress, resting comfortably CV: Regular rate and rhythm Respiratory: Breathing comfortably on room air, good air movement throughout, no wheezes Abdomen: Bowel sounds present.  Soft, nontender.  Small areas of scar tissue from Lovenox  injection present throughout lower abdomen  ASSESSMENT/PLAN:   Assessment & Plan Pulmonary embolism, other, unspecified chronicity, unspecified whether acute cor pulmonale present (HCC) No new symptoms, stable - Continue Lovenox  injections twice daily Screening for diabetes mellitus (DM) A1c 4.9 today, well controlled Mixed hyperlipidemia Stable, last lipid panel 11/2021 WNL Repeat lipids next visit Duodenal ulcer Asymptomatic on daily PPI, will continue while on anticoagulation Refilled protonix  40mg    Follow up in 3 months  Sarahann Cumins, DO [facility]

## 2023-06-05 NOTE — Progress Notes (Signed)
 [facility] [phone]   [phone]  PROGRESS NOTE FOR TELEMEDICINE VISITS  [name], [name], DO [address] [place] [address]  I connected withNAME@ on 06/05/23 at  8:30 AM EDT by telephone visit and verified that I am speaking with the correct person using two identifiers.   I discussed the limitations, risks, security and privacy concerns of performing an evaluation and management service by telemedicine and the availability of in-person appointments. I also discussed with the patient that there may be a patient responsible charge related to this service. The patient expressed understanding and agreed to proceed.  Other persons participating in the visit and their role in the encounter:  son  [name] location: Home Provider's location:  cancer Center  DIAGNOSIS:  1) Stage IV (T1b, N3, M1 C) non-small cell lung cancer, adenocarcinoma with positive ALK gene translocation diagnosed in February 2023. 2) deep venous thrombosis of the left lower extremity   PRIOR THERAPY: None   CURRENT THERAPY:  1) Alecensa  (Alectinib) 600 mg p.o. twice daily.  First dose April 11, 2021.  Status post 20 months of treatment. 2) Eliquis  2.5 mg p.o. twice daily.  First dose 10/11/2021.  This was a change it to Lovenox  70 mg subcutaneously twice a day because of recurrent deep venous thrombosis.  INTERVAL HISTORY: Arthur [name] 83 y.o. male has a telephone virtual visit with me today for evaluation and discussion of his scan results.Discussed the use of AI scribe software for clinical note transcription with the patient, who gave verbal consent to proceed.  History of Present Illness   Arthur [name] is an 83 year old male with stage four non-small cell lung cancer adenocarcinoma who presents for evaluation and discussion of recent imaging studies.  He has stage four non-small cell lung cancer adenocarcinoma with a positive ALK gene  translocation, diagnosed in February 2023. He has been on alectinib 600 mg twice daily since March 2023. He reports no new complaints, including no chest pain, dizziness, vomiting, body pain, or stomach pain. He denies any recent weight loss, maintaining a weight of 161-162 pounds, and reports no side effects from alectinib.  He has a history of venous thrombosis in the left lower extremity and is currently on Lovenox  70 mg twice daily, having switched from Eliquis  after multiple nerve fusions and blood clot incidents last year, around June or July. He has been on Lovenox  since then, with his primary care physician managing the prescription.       MEDICAL HISTORY: Past Medical History:  Diagnosis Date   Diabetes mellitus without complication (HCC)    High cholesterol    Hypertension    lung ca    Normal nuclear stress test 01/29/2008   stress perfusion study apparently in 2010 in [facility] which he said was negative.    ALLERGIES:  is allergic to ace inhibitors and iohexol .  MEDICATIONS:  Current Outpatient Medications  Medication Sig Dispense Refill   alectinib (ALECENSA ) 150 MG capsule Take 4 capsules (600 mg total) by mouth 2 (two) times daily with a meal. 240 capsule 3   baclofen  (LIORESAL ) 10 MG tablet Take 1 tablet (10 mg total) by mouth 2 (two) times daily as needed for muscle spasms. (Patient not taking: Reported on 01/31/2023) 30 each 1   benzonatate  (TESSALON ) 100 MG capsule Take 1 capsule (100 mg total) by mouth 2 (two) times daily as needed for cough. (Patient not taking: Reported on 01/31/2023) 30 capsule 1  cetirizine  (ZYRTEC ) 10 MG tablet Take 1 tablet (10 mg total) by mouth daily. (Patient not taking: Reported on 01/31/2023) 30 tablet 0   clopidogrel  (PLAVIX ) 75 MG tablet Take 1 tablet (75 mg total) by mouth daily with breakfast. 90 tablet 1   diclofenac  Sodium (VOLTAREN ) 1 % GEL APPLY 2 GRAMS TOPICALLY FOUR TIMES DAILY 100 g 3   enoxaparin  (LOVENOX ) 80 MG/0.8ML  injection Inject 0.7 mLs (70 mg total) into the skin every 12 (twelve) hours. 21 mL 0   furosemide  (LASIX ) 20 MG tablet Take 20 mg by mouth daily as needed for edema (lower extremites). (Patient not taking: Reported on 01/31/2023)     Iron -FA-B Cmp-C-Biot-Probiotic (FUSION PLUS) CAPS Take 1 capsule by mouth daily. 90 capsule 1   lidocaine  (LIDODERM ) 5 % Place 1 patch onto the skin daily. Remove & Discard patch within 12 hours or as directed by MD 14 patch 0   pantoprazole  (PROTONIX ) 40 MG tablet Take 1 tablet by mouth once daily 90 tablet 0   polyethylene glycol (MIRALAX  / GLYCOLAX ) 17 g packet Take 17 g by mouth 2 (two) times daily. (Patient not taking: Reported on 01/31/2023)     rosuvastatin  (CRESTOR ) 5 MG tablet Take 1 tablet by mouth once daily 90 tablet 3   senna (SENOKOT) 8.6 MG TABS tablet Take 1 tablet (8.6 mg total) by mouth daily. (Patient not taking: Reported on 01/31/2023) 120 tablet 0   sodium chloride  (OCEAN) 0.65 % SOLN nasal spray Place 1 spray into both nostrils as needed for congestion. 88 mL 1   No current facility-administered medications for this visit.    SURGICAL HISTORY:  Past Surgical History:  Procedure Laterality Date   BIOPSY  10/17/2022   Procedure: BIOPSY;  Surgeon: Elois Hair, MD;  Location[facility] ENDOSCOPY;  Service: Gastroenterology;;   CATARACT EXTRACTION, BILATERAL  2006   CORONARY STENT INTERVENTION N/A 02/06/2021   Procedure: CORONARY STENT INTERVENTION;  Surgeon: Lucendia Rusk, MD;  Location: [facility] INVASIVE CV LAB;  Service: Cardiovascular;  Laterality: N/A;   CORONARY ULTRASOUND/IVUS N/A 02/06/2021   Procedure: Intravascular Ultrasound/IVUS;  Surgeon: Lucendia Rusk, MD;  Location: [facility] INVASIVE CV LAB;  Service: Cardiovascular;  Laterality: N/A;   ESOPHAGOGASTRODUODENOSCOPY N/A 10/17/2022   Procedure: ESOPHAGOGASTRODUODENOSCOPY (EGD);  Surgeon: Elois Hair, MD;  Location: [facility] ENDOSCOPY;  Service: Gastroenterology;  Laterality: N/A;   FINE  NEEDLE ASPIRATION  03/21/2021   Procedure: FINE NEEDLE ASPIRATION;  Surgeon: Prudy Brownie, DO;  Location: [facility] ENDOSCOPY;  Service: Pulmonary;;   IR RADIOLOGIST EVAL & MGMT  12/24/2021   KNEE SURGERY  2005   LEFT HEART CATH AND CORONARY ANGIOGRAPHY N/A 02/06/2021   Procedure: LEFT HEART CATH AND CORONARY ANGIOGRAPHY;  Surgeon: Lucendia Rusk, MD;  Location: [facility] INVASIVE CV LAB;  Service: Cardiovascular;  Laterality: N/A;   VIDEO BRONCHOSCOPY WITH ENDOBRONCHIAL ULTRASOUND Bilateral 03/21/2021   Procedure: VIDEO BRONCHOSCOPY WITH ENDOBRONCHIAL ULTRASOUND;  Surgeon: Prudy Brownie, DO;  Location: [facility] ENDOSCOPY;  Service: Pulmonary;  Laterality: Bilateral;    REVIEW OF SYSTEMS:  Constitutional: positive for fatigue Eyes: negative Ears, nose, mouth, throat, and face: negative Respiratory: negative Cardiovascular: negative Gastrointestinal: negative Genitourinary:negative Integument/breast: negative Hematologic/lymphatic: negative Musculoskeletal:negative Neurological: negative Behavioral/Psych: negative Endocrine: negative Allergic/Immunologic: negative    LABORATORY DATA: Lab Results  Component Value Date   WBC 6.6 05/27/2023   HGB 8.6 (L) 05/27/2023   HCT 25.8 (L) 05/27/2023   MCV 85.7 05/27/2023   PLT 368 05/27/2023      Chemistry  Component Value Date/Time   NA 142 05/27/2023 1105   NA 141 10/15/2022 1229   K 3.9 05/27/2023 1105   CL 109 05/27/2023 1105   CO2 29 05/27/2023 1105   BUN 13 05/27/2023 1105   BUN 34 (H) 10/15/2022 1229   CREATININE 1.11 05/27/2023 1105   CREATININE 1.18 11/15/2015 1114      Component Value Date/Time   CALCIUM  8.1 (L) 05/27/2023 1105   ALKPHOS 50 05/27/2023 1105   AST 21 05/27/2023 1105   ALT 12 05/27/2023 1105   BILITOT 1.0 05/27/2023 1105       RADIOGRAPHIC STUDIES: CT Chest Wo Contrast Result Date: 06/05/2023 CLINICAL DATA:  History of non-small-cell lung cancer current Leigh on immunotherapy. Staging. * Tracking Code:  BO * EXAM: CT CHEST WITHOUT CONTRAST TECHNIQUE: Multidetector CT imaging of the chest was performed following the standard protocol without IV contrast. RADIATION DOSE REDUCTION: This exam was performed according to the departmental dose-optimization program which includes automated exposure control, adjustment of the mA and/or kV according to patient size and/or use of iterative reconstruction technique. COMPARISON:  CT chest dated 01/08/2023 and priors FINDINGS: Cardiovascular: Normal heart size. No significant pericardial fluid/thickening. Great vessels are normal in course and caliber. Coronary artery calcifications and aortic atherosclerosis. Mediastinum/Nodes: Imaged thyroid  gland without nodules meeting criteria for imaging follow-up by size. Normal esophagus. 12 mm AP window lymph node (2:23), unchanged in size over multiple prior studies. Lungs/Pleura: The central airways are patent. Minimal lower lobe predominant interlobular septal thickening. Slight thickening along the bilateral pleural fissures. Unchanged right lower lobe 6 x 4 mm nodule (6:76). Minimal decrease in size of peripheral left lower lobe nodule measuring 5 x 5 mm (6:116), previously 6 x 6 mm on 09/19/2022 and not well seen on 01/08/2023 due to motion artifact. No pneumothorax. Unchanged loculated medial right pleural effusion. New trace left pleural effusion. Upper abdomen: Please see separately dictated CT abdomen and pelvis report for detailed findings. Musculoskeletal: No acute or abnormal lytic or blastic osseous lesions. Multilevel degenerative changes of the thoracic spine. IMPRESSION: 1. Minimal decrease in size of peripheral left lower lobe nodule and unchanged right lower lobe nodule. 2. Minimal lower lobe predominant interlobular septal thickening, which may represent mild pulmonary edema. 3. New trace left pleural effusion. Unchanged loculated medial right pleural effusion. 4. Aortic Atherosclerosis (ICD10-I70.0). Coronary artery  calcifications. Assessment for potential risk factor modification, dietary therapy or pharmacologic therapy may be warranted, if clinically indicated. Electronically Signed   By: Limin  Xu M.D.   On: 06/05/2023 08:18   CT ABDOMEN PELVIS WO CONTRAST Result Date: 05/29/2023 EXAMINATION: CT ABDOMEN PELVIS WO CONTRAST CLINICAL INDICATION: Male, 83 years old. Non-small cell lung cancer (NSCLC), staging TECHNIQUE: Helical CT scan examination of the abdomen and pelvis is performed from the domes of the diaphragm to the pubic symphysis. Limited evaluation of the solid organs due to lack of intravenous contrast. Unless otherwise specified, incidental thyroid , adrenal, renal lesions do not require dedicated imaging follow up. Additionally, any mentioned pulmonary nodules do not require dedicated imaging follow-up based on the Fleischner guidelines unless otherwise specified. Coronary calcifications are not identified unless otherwise specified. COMPARISON: 01/08/2023 FINDINGS: Regarding findings of the lung bases, please refer to the chest report. The liver appears normal. Exam is degraded secondary to motion. The gallbladder is normal. The spleen is normal. There are findings of chronic pancreatitis. The adrenals appear normal other than a stable small right adrenal adenoma. The left kidney is normal. Right renal cysts are  seen. 3 mm nonobstructing right intrarenal stone noted. Abdominal aorta is normal in caliber. Scattered calcified atherosclerotic changes are present. There is an indwelling IVC filter. Urinary bladder is normal. The prostate is mildly enlarged. There is a small to moderate right inguinal hernia containing small bowel. Large and small bowel loops are otherwise within normal limits. There is no free fluid or pathologic lymphadenopathy by size criteria. No suspicious osseous lesions are identified. There is diffuse osseous demineralization. Old L4 and L5 compression fractures are noted. There are  degenerative changes of the spine and bony pelvis. IMPRESSION: No evidence for metastatic disease within the abdomen or pelvis. DOSE REDUCTION: This exam was performed according to our departmental dose-optimization program which includes automated exposure control, adjustment of the mA and/or kV according to patient size and/or use of iterative reconstruction technique. Electronically signed by: [place] Engel MD 05/29/2023 10:55 AM EDT RP Workstation: [id number]    ASSESSMENT AND PLAN: This is a very pleasant 83 years old [place] male diagnosed with Stage IV (T1b, N3, M1 C) non-small cell lung cancer, adenocarcinoma with positive ALK gene translocation in February 2023. The patient is currently undergoing treatment with Alecensa  (Alectinib) 600 mg p.o. twice daily.  He has been tolerating the treatment well with no concerning complaints.  He is status post 26 months of treatment.   The patient has been tolerating his treatment with Alecensa  (Alectinib) fairly well.    Stage 4 non-small cell lung cancer Stage 4 non-small cell lung cancer adenocarcinoma with positive ALK gene translocation, diagnosed in February 2023. Currently on alectinib 600 mg PO BID for 26 months. Imaging of chest, abdomen, and pelvis shows well-controlled disease with no new findings. No new symptoms or side effects from alectinib. Treatment is effective, and symptoms are well-managed. - Continue alectinib 600 mg PO BID - Review chest imaging report once available  Anemia due to chemotherapy Chronic anemia with hemoglobin at 8.6, consistent with previous levels. Iron  studies and ferritin are normal. Anemia likely due to hemolytic effects of alectinib and possible blood loss from anticoagulation therapy. Symptoms are minimal, and no changes in treatment are necessary. - Continue current management of anemia - Monitor hemoglobin levels  Deep venous thrombosis of left lower extremity Deep venous thrombosis in the left lower  extremity managed with Lovenox  70 mg subcutaneously BID. Transitioned from Eliquis  to Lovenox  due to previous complications. No new symptoms reported. - Continue Lovenox  70 mg subcutaneously BID - Coordinate with primary care physician for Lovenox  refills   The patient was advised to call immediately if he has any other concerning symptoms in the interval.  I discussed the assessment and treatment plan with the patient. The patient was provided an opportunity to ask questions and all were answered. The patient agreed with the plan and demonstrated an understanding of the instructions.   The patient was advised to call back or seek an in-person evaluation if the symptoms worsen or if the condition fails to improve as anticipated.  I provided 34 minutes of non face-to-face telephone visit time during this encounter, and > 50% was spent counseling as documented under my assessment & plan.  Aurelio Blower, MD 06/05/2023 7:44 AM  Disclaimer: This note was dictated with voice recognition software. Similar sounding words can inadvertently be transcribed and may not be corrected upon review.

## 2023-06-05 NOTE — Assessment & Plan Note (Addendum)
 Stable, last lipid panel 11/2021 WNL Repeat lipids next visit

## 2023-06-05 NOTE — Assessment & Plan Note (Signed)
 No new symptoms, stable - Continue Lovenox  injections twice daily

## 2023-06-10 ENCOUNTER — Other Ambulatory Visit (HOSPITAL_COMMUNITY): Payer: Self-pay

## 2023-06-24 ENCOUNTER — Other Ambulatory Visit: Payer: Self-pay

## 2023-06-24 NOTE — Progress Notes (Signed)
Opened addendum in error 

## 2023-06-24 NOTE — Progress Notes (Signed)
 Specialty Pharmacy Refill Coordination Note  Arthur [name] is a 83 y.o. male contacted today regarding refills of specialty medication(s) Alectinib HCl (Alecensa )   Patient requested Delivery   Delivery date: 06/27/23   Verified address: [address] [name] [address]   Medication will be filled on 06/26/23.

## 2023-06-24 NOTE — Progress Notes (Signed)
 Specialty Pharmacy Ongoing Clinical Assessment Note  Arthur [name] is a 83 y.o. male who is being followed by the specialty pharmacy service for RxSp Oncology   [name] specialty medication(s) reviewed today: Alectinib HCl (Alecensa )   Missed doses in the last 4 weeks: 0   Patient/Caregiver did not have any additional questions or concerns.   Therapeutic benefit summary: Patient is achieving benefit   Adverse events/side effects summary: No adverse events/side effects   [name] therapy is appropriate to: Continue    Goals Addressed             This Visit's Progress    Slow Disease Progression   On track    Patient is on track. Patient will maintain adherence.  Per office visit on 06/05/23, Dr. Marguerita Shih reported imaging of chest, abdomen, and pelvis showed well-controlled disease with no new findings..          Follow up: 6 months  [facility]

## 2023-06-25 ENCOUNTER — Ambulatory Visit: Admitting: Student

## 2023-06-25 VITALS — BP 133/64 | Ht 71.0 in

## 2023-06-25 DIAGNOSIS — R310 Gross hematuria: Secondary | ICD-10-CM | POA: Diagnosis not present

## 2023-06-25 DIAGNOSIS — R319 Hematuria, unspecified: Secondary | ICD-10-CM | POA: Diagnosis not present

## 2023-06-25 LAB — POCT URINALYSIS DIP (MANUAL ENTRY)
Glucose, UA: NEGATIVE mg/dL
Leukocytes, UA: NEGATIVE
Nitrite, UA: NEGATIVE
Protein Ur, POC: 100 mg/dL — AB
Spec Grav, UA: 1.02 (ref 1.010–1.025)
Urobilinogen, UA: 2 U/dL — AB
pH, UA: 6 (ref 5.0–8.0)

## 2023-06-25 LAB — POCT UA - MICROSCOPIC ONLY
Bacteria, U Microscopic: NONE SEEN
RBC, Urine, Miroscopic: >20 (ref 0–2)
WBC, Ur, HPF, POC: NONE SEEN (ref 0–5)

## 2023-06-25 LAB — POCT HEMOGLOBIN: Hemoglobin: 8.7 g/dL — AB (ref 11–14.6)

## 2023-06-25 NOTE — Progress Notes (Signed)
    SUBJECTIVE:   CHIEF COMPLAINT / HPI:   Gross hematuria Patient reports visible blood (bright color) urine for the past 2 days.  Painless.  No recent traumatic injury.  Currently taking Lovenox  injections for pulmonary embolism with cor pulmonale.  Also takes Plavix  for LAD stent.  He has history of GI bleeding.  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  He has nonsymptomatic right nephrolithiasis which could be contributing to hematuria, however no evidence of obstruction as he is in no pain.  OBJECTIVE:   BP 133/64   Ht 5\' 11"  (1.803 m)   BMI 21.67 kg/m    General: NAD, pleasant Cardio: RRR, no MRG. Cap Refill <2s. Respiratory: CTAB, normal wob on RA GI: Abdomen is soft, not tender, not distended. BS present Skin: Warm and dry  ASSESSMENT/PLAN:   Assessment & Plan Gross hematuria Painless hematuria.  Stable hemoglobin of 8.7 today.  UA not concerning for infection, large blood present.>20 Rbcs on Microscope today.  Multiple risk factors for hematuria including: Right renal nephrolithiasis (of no evidence of obstruction today), Lovenox  use, Plavix  use, prior history of GI bleeding. - Urgent referral to urology for cystoscopy to rule out other causes of gross hematuria - CBC today - Return precautions and ED precautions provided   Lavada Porteous, DO [facility]

## 2023-06-25 NOTE — Patient Instructions (Signed)
 It was great to see you! Thank you for allowing me to participate in your care!   I recommend that you always bring your medications to each appointment as this makes it easy to ensure we are on the correct medications and helps us  not miss when refills are needed.  Our plans for today:  - I have sent an urgent referral to urology, they will call you to make an appointment. -If you develop symptoms similar to when he had his GI bleed such as shortness of breath, fatigue, pale skin, fevers-please go to the emergency room immediately - We are checking some labs today, I will call you if they are abnormal will send you a MyChart message or a letter if they are normal.  If you do not hear about your labs in the next 2 weeks please let us  know.  Take care and seek immediate care sooner if you develop any concerns. Please remember to show up 15 minutes before your scheduled appointment time!  Lavada Porteous, DO [facility]

## 2023-06-26 ENCOUNTER — Ambulatory Visit: Payer: Self-pay | Admitting: Student

## 2023-06-26 ENCOUNTER — Other Ambulatory Visit: Payer: Self-pay

## 2023-06-26 LAB — CBC WITH DIFFERENTIAL/PLATELET
Basophils Absolute: 0.1 10*3/uL (ref 0.0–0.2)
Basos: 1 %
EOS (ABSOLUTE): 0.2 10*3/uL (ref 0.0–0.4)
Eos: 2 %
Hematocrit: 27.8 % — ABNORMAL LOW (ref 37.5–51.0)
Hemoglobin: 8.8 g/dL — ABNORMAL LOW (ref 13.0–17.7)
Immature Grans (Abs): 0.1 10*3/uL (ref 0.0–0.1)
Immature Granulocytes: 1 %
Lymphocytes Absolute: 1.6 10*3/uL (ref 0.7–3.1)
Lymphs: 22 %
MCH: 28.7 pg (ref 26.6–33.0)
MCHC: 31.7 g/dL (ref 31.5–35.7)
MCV: 91 fL (ref 79–97)
Monocytes Absolute: 0.7 10*3/uL (ref 0.1–0.9)
Monocytes: 9 %
Neutrophils Absolute: 5 10*3/uL (ref 1.4–7.0)
Neutrophils: 65 %
Platelets: 400 10*3/uL (ref 150–450)
RBC: 3.07 x10E6/uL — ABNORMAL LOW (ref 4.14–5.80)
RDW: 16.6 % — ABNORMAL HIGH (ref 11.6–15.4)
WBC: 7.6 10*3/uL (ref 3.4–10.8)

## 2023-07-18 ENCOUNTER — Other Ambulatory Visit: Payer: Self-pay

## 2023-07-18 ENCOUNTER — Other Ambulatory Visit: Payer: Self-pay | Admitting: Internal Medicine

## 2023-07-18 MED ORDER — ALECENSA 150 MG PO CAPS
600.0000 mg | ORAL_CAPSULE | Freq: Two times a day (BID) | ORAL | 3 refills | Status: DC
Start: 2023-07-18 — End: 2023-10-20
  Filled 2023-07-18 – 2023-07-24 (×3): qty 240, 30d supply, fill #0
  Filled 2023-08-20: qty 240, 30d supply, fill #1
  Filled 2023-09-12: qty 240, 30d supply, fill #2

## 2023-07-22 ENCOUNTER — Other Ambulatory Visit (HOSPITAL_COMMUNITY): Payer: Self-pay

## 2023-07-22 ENCOUNTER — Other Ambulatory Visit: Payer: Self-pay

## 2023-07-24 ENCOUNTER — Other Ambulatory Visit: Payer: Self-pay

## 2023-07-24 ENCOUNTER — Other Ambulatory Visit (HOSPITAL_COMMUNITY): Payer: Self-pay

## 2023-07-24 NOTE — Progress Notes (Signed)
 Specialty Pharmacy Refill Coordination Note  Arthur [name] is a 83 y.o. male contacted today regarding refills of specialty medication(s) Alectinib HCl (Alecensa )   Patient requested Delivery   Delivery date: 07/28/23   Verified address: [address]  [place] [address]   Medication will be filled on 07/25/23.

## 2023-08-04 ENCOUNTER — Telehealth: Payer: Self-pay | Admitting: Internal Medicine

## 2023-08-04 NOTE — Telephone Encounter (Signed)
 Rescheduled appointments with the patient. He is aware of the changes made and confirmed appointment changes.

## 2023-08-05 ENCOUNTER — Inpatient Hospital Stay

## 2023-08-05 ENCOUNTER — Inpatient Hospital Stay: Admitting: Internal Medicine

## 2023-08-06 ENCOUNTER — Other Ambulatory Visit: Payer: Self-pay | Admitting: Family Medicine

## 2023-08-17 NOTE — Progress Notes (Unsigned)
 [facility] OFFICE PROGRESS NOTE  [name], Logan, DO [address] [place] [address]  DIAGNOSIS: 1) Stage IV (T1b, N3, M1 C) non-small cell lung cancer, adenocarcinoma with positive ALK gene translocation diagnosed in February 2023. 2) deep venous thrombosis of the left lower extremity  PRIOR THERAPY: None  CURRENT THERAPY: 1) Alecensa  (Alectinib) 600 mg p.o. twice daily.  First dose April 11, 2021.  Status post 22 months of treatment. 2) Eliquis  2.5 mg p.o. twice daily. First dose 10/11/2021. This was a change it to Lovenox  70 mg subcutaneously twice a day because of recurrent deep venous thrombosis.   INTERVAL HISTORY: Bren R Meacham 83 y.o. male returns to the clinic today for a follow-up visit accompanied by his son. The patient is followed by the clinic for his history of non-small cell lung cancer. He was last seen by Dr. Sherrod on 06/05/23. He has been on this since March 2023. He tolerates it well.   He was seen by his PCPs office in late may 2025 for hematuria. The patient has history of hemolytic anemia. He did not have evidence of infection and he was referred to urology. His hematuria has since resolved.   The patient is on lovenox  for his history of DVT and PE. He denies visible bleeding. His Hbg typically ranges in the 8-9's. He has been on prednisone  in the past for hemolytic anemia. He is not on any at this time.   He denies any visible bleeding.  His only concern is with 8 lb weight loss and not having a strong appetite.They are wondering if he can have steriods.    Denies any fever, chills, or night sweats. Denies any chest pain, shortness of breath, cough, or hemoptysis.  He states his breathing is good. Denies any nausea, vomiting, diarrhea, or constipation.  He has stable bilateral lower extremity swelling.  Denies any rashes or skin changes.  Denies any headache or visual changes.  He is currently taking Protonix . He denies experiencing reflux or  heartburn frequently, but occasionally coughs after meals. He reports that after eating, he sits upright for several hours before going to bed. He reports this occurs about 2x per month.  He is here today for evaluation repeat blood work. He is here for evaluation and repeat blood work.   MEDICAL HISTORY: Past Medical History:  Diagnosis Date   Diabetes mellitus without complication (HCC)    High cholesterol    Hypertension    lung ca    Normal nuclear stress test 01/29/2008   stress perfusion study apparently in 2010 in [facility] which he said was negative.    ALLERGIES:  is allergic to ace inhibitors and iohexol .  MEDICATIONS:  Current Outpatient Medications  Medication Sig Dispense Refill   alectinib (ALECENSA ) 150 MG capsule Take 4 capsules (600 mg total) by mouth 2 (two) times daily with a meal. 240 capsule 3   baclofen  (LIORESAL ) 10 MG tablet Take 1 tablet (10 mg total) by mouth 2 (two) times daily as needed for muscle spasms. (Patient not taking: Reported on 01/31/2023) 30 each 1   clopidogrel  (PLAVIX ) 75 MG tablet Take 1 tablet (75 mg total) by mouth daily with breakfast. 90 tablet 1   enoxaparin  (LOVENOX ) 80 MG/0.8ML injection Inject 0.7 mLs (70 mg total) into the skin every 12 (twelve) hours. 42 mL 3   Iron -FA-B Cmp-C-Biot-Probiotic (FUSION PLUS) CAPS Take 1 capsule by mouth once daily 90 capsule 0   pantoprazole  (PROTONIX ) 40 MG  tablet Take 1 tablet (40 mg total) by mouth daily. 90 tablet 3   rosuvastatin  (CRESTOR ) 5 MG tablet Take 1 tablet by mouth once daily 90 tablet 3   sodium chloride  (OCEAN) 0.65 % SOLN nasal spray Place 1 spray into both nostrils as needed for congestion. 88 mL 1   No current facility-administered medications for this visit.    SURGICAL HISTORY:  Past Surgical History:  Procedure Laterality Date   BIOPSY  10/17/2022   Procedure: BIOPSY;  Surgeon: Stacia Glendia BRAVO, MD;  Location: [facility] ENDOSCOPY;  Service: Gastroenterology;;   CATARACT  EXTRACTION, BILATERAL  2006   CORONARY STENT INTERVENTION N/A 02/06/2021   Procedure: CORONARY STENT INTERVENTION;  Surgeon: Dann Candyce RAMAN, MD;  Location: [facility] INVASIVE CV LAB;  Service: Cardiovascular;  Laterality: N/A;   CORONARY ULTRASOUND/IVUS N/A 02/06/2021   Procedure: Intravascular Ultrasound/IVUS;  Surgeon: Dann Candyce RAMAN, MD;  Location: [facility] INVASIVE CV LAB;  Service: Cardiovascular;  Laterality: N/A;   ESOPHAGOGASTRODUODENOSCOPY N/A 10/17/2022   Procedure: ESOPHAGOGASTRODUODENOSCOPY (EGD);  Surgeon: Stacia Glendia BRAVO, MD;  Location: [facility] ENDOSCOPY;  Service: Gastroenterology;  Laterality: N/A;   FINE NEEDLE ASPIRATION  03/21/2021   Procedure: FINE NEEDLE ASPIRATION;  Surgeon: Brenna Adine CROME, DO;  Location: [facility] ENDOSCOPY;  Service: Pulmonary;;   IR RADIOLOGIST EVAL & MGMT  12/24/2021   KNEE SURGERY  2005   LEFT HEART CATH AND CORONARY ANGIOGRAPHY N/A 02/06/2021   Procedure: LEFT HEART CATH AND CORONARY ANGIOGRAPHY;  Surgeon: Dann Candyce RAMAN, MD;  Location: [facility] INVASIVE CV LAB;  Service: Cardiovascular;  Laterality: N/A;   VIDEO BRONCHOSCOPY WITH ENDOBRONCHIAL ULTRASOUND Bilateral 03/21/2021   Procedure: VIDEO BRONCHOSCOPY WITH ENDOBRONCHIAL ULTRASOUND;  Surgeon: Brenna Adine CROME, DO;  Location: [facility] ENDOSCOPY;  Service: Pulmonary;  Laterality: Bilateral;    REVIEW OF SYSTEMS:   Review of Systems  Constitutional: Positive for decreased appetite and weight loss. Negative for chills, fever and unexpected weight change.  HENT: Negative for mouth sores, nosebleeds, sore throat and trouble swallowing.   Eyes: Negative for eye problems and icterus.  Respiratory: Negative for cough (rarely may cough after eating), hemoptysis, shortness of breath and wheezing.   Cardiovascular: Negative for chest pain and leg swelling.  Gastrointestinal: Negative for abdominal pain, constipation, diarrhea, nausea and vomiting.  Genitourinary: Negative for bladder incontinence, difficulty urinating,  dysuria, frequency and hematuria.   Musculoskeletal: Negative for back pain, gait problem, neck pain and neck stiffness.  Skin: Negative for itching and rash.  Neurological: Negative for dizziness, extremity weakness, gait problem, headaches, light-headedness and seizures.  Hematological: Negative for adenopathy. Does not bruise/bleed easily.  Psychiatric/Behavioral: Negative for confusion, depression and sleep disturbance. The patient is not nervous/anxious.     PHYSICAL EXAMINATION:  There were no vitals taken for this visit.  ECOG PERFORMANCE STATUS: 1  Physical Exam  Constitutional: Oriented to person, place, and time and well-developed, well-nourished, and in no distress.  HENT:  Head: Normocephalic and atraumatic.  Mouth/Throat: Oropharynx is clear and moist. No oropharyngeal exudate.  Eyes: Conjunctivae are normal. Right eye exhibits no discharge. Left eye exhibits no discharge. No scleral icterus.  Neck: Normal range of motion. Neck supple.  Cardiovascular: Normal rate, regular rhythm, normal heart sounds and intact distal pulses.   Pulmonary/Chest: Effort normal and breath sounds normal. No respiratory distress. No wheezes. No rales.  Abdominal: Soft. Bowel sounds are normal. Exhibits no distension and no mass. There is no tenderness.  Musculoskeletal: Normal range of motion. Exhibits no edema.  Lymphadenopathy:    No  cervical adenopathy.  Neurological: Alert and oriented to person, place, and time. Exhibits muscle wasting. Examined in the wheelchair.  Skin: Skin is warm and dry. No rash noted. Not diaphoretic. No erythema. No pallor.  Psychiatric: Mood, memory and judgment normal.  Vitals reviewed.    LABORATORY DATA: Lab Results  Component Value Date   WBC 7.6 06/25/2023   HGB 8.8 (L) 06/25/2023   HCT 27.8 (L) 06/25/2023   MCV 91 06/25/2023   PLT 400 06/25/2023      Chemistry      Component Value Date/Time   NA 142 05/27/2023 1105   NA 141 10/15/2022 1229   K  3.9 05/27/2023 1105   CL 109 05/27/2023 1105   CO2 29 05/27/2023 1105   BUN 13 05/27/2023 1105   BUN 34 (H) 10/15/2022 1229   CREATININE 1.11 05/27/2023 1105   CREATININE 1.18 11/15/2015 1114      Component Value Date/Time   CALCIUM  8.1 (L) 05/27/2023 1105   ALKPHOS 50 05/27/2023 1105   AST 21 05/27/2023 1105   ALT 12 05/27/2023 1105   BILITOT 1.0 05/27/2023 1105       RADIOGRAPHIC STUDIES:  No results found.   ASSESSMENT/PLAN:  This is a very pleasant 83 year old [place] male diagnosed with stage IV (T1b, N3, M1C) non-small cell lung cancer, adenocarcinoma. The patient has a positive ALK gene translocation. He was diagnosed in February 2023.    The patient is currently undergoing treatment with Alecensa  (Alectinib) 600 mg p.o. twice daily.  He has been tolerating the treatment well with no concerning complaints.  He is status post 22 months of treatment.     The patient was previously hospitalized for worsening anemia. Dr. Sherrod believes this is a combination of IDA, anemia of chronic disease, and hemolytic anemia.     Labs were reviewed.  Hemoglobin is 8.1 today. He does not need a blood transfusion at this time.     We will see him back for a follow-up visit 2 months for evaluation and restaging CT scan of the CAP. We will order without contrast due to allergy.    He will continue his Lovenox  for history of DVT and PE.    I will wait for the results of his haptoglobin, iron , ferritin, LDH, and folate/B12. If there is concern for hemolysis, we will contact him for instructions. I will check with Dr. Sherrod if he should be on folic acid regardless with his history. If no prednisone  needed, I told them I can send him a medrol  dose pack for his appetite short term.   We will monitor his labs closer for his anemia which has been stable in the 8's for over 7 months but it is slightly lower today at 8.1. I have standing orders for sample to blood bank.    Increase dietary  calcium  and protein intake based on CMP.   He is not a good candidate for megace due to DVT history. He is not a good candidate for Marinol due to age.   The patient was advised to call immediately if she has any concerning symptoms in the interval. The patient voices understanding of current disease status and treatment options and is in agreement with the current care plan. All questions were answered. The patient knows to call the clinic with any problems, questions or concerns. We can certainly see the patient much sooner if necessary   No orders of the defined types were placed in this encounter.  The total time spent in the appointment was 30-39 minutes  Tinley Rought L Monae Topping, PA-C 08/17/23

## 2023-08-18 ENCOUNTER — Inpatient Hospital Stay (HOSPITAL_BASED_OUTPATIENT_CLINIC_OR_DEPARTMENT_OTHER): Admitting: Physician Assistant

## 2023-08-18 ENCOUNTER — Inpatient Hospital Stay: Attending: Physician Assistant

## 2023-08-18 VITALS — BP 135/54 | HR 94 | Temp 98.5°F | Resp 16 | Wt 146.2 lb

## 2023-08-18 DIAGNOSIS — C3491 Malignant neoplasm of unspecified part of right bronchus or lung: Secondary | ICD-10-CM

## 2023-08-18 DIAGNOSIS — C3490 Malignant neoplasm of unspecified part of unspecified bronchus or lung: Secondary | ICD-10-CM | POA: Insufficient documentation

## 2023-08-18 DIAGNOSIS — Z79899 Other long term (current) drug therapy: Secondary | ICD-10-CM | POA: Insufficient documentation

## 2023-08-18 DIAGNOSIS — Z85118 Personal history of other malignant neoplasm of bronchus and lung: Secondary | ICD-10-CM | POA: Diagnosis not present

## 2023-08-18 DIAGNOSIS — Z7901 Long term (current) use of anticoagulants: Secondary | ICD-10-CM | POA: Diagnosis not present

## 2023-08-18 DIAGNOSIS — Z86711 Personal history of pulmonary embolism: Secondary | ICD-10-CM | POA: Insufficient documentation

## 2023-08-18 DIAGNOSIS — D649 Anemia, unspecified: Secondary | ICD-10-CM

## 2023-08-18 DIAGNOSIS — D589 Hereditary hemolytic anemia, unspecified: Secondary | ICD-10-CM | POA: Insufficient documentation

## 2023-08-18 DIAGNOSIS — D539 Nutritional anemia, unspecified: Secondary | ICD-10-CM

## 2023-08-18 LAB — CMP (CANCER CENTER ONLY)
ALT: 15 U/L (ref 0–44)
AST: 22 U/L (ref 15–41)
Albumin: 2.5 g/dL — ABNORMAL LOW (ref 3.5–5.0)
Alkaline Phosphatase: 53 U/L (ref 38–126)
Anion gap: 4 — ABNORMAL LOW (ref 5–15)
BUN: 14 mg/dL (ref 8–23)
CO2: 29 mmol/L (ref 22–32)
Calcium: 7.6 mg/dL — ABNORMAL LOW (ref 8.9–10.3)
Chloride: 110 mmol/L (ref 98–111)
Creatinine: 1.19 mg/dL (ref 0.61–1.24)
GFR, Estimated: >60 mL/min (ref >60)
Glucose, Bld: 121 mg/dL — ABNORMAL HIGH (ref 70–99)
Potassium: 3.5 mmol/L (ref 3.5–5.1)
Sodium: 143 mmol/L (ref 135–145)
Total Bilirubin: 1.2 mg/dL (ref 0.0–1.2)
Total Protein: 5.5 g/dL — ABNORMAL LOW (ref 6.5–8.1)

## 2023-08-18 LAB — SAMPLE TO BLOOD BANK

## 2023-08-18 LAB — CBC WITH DIFFERENTIAL (CANCER CENTER ONLY)
Abs Immature Granulocytes: 0.05 K/uL (ref 0.00–0.07)
Basophils Absolute: 0.1 K/uL (ref 0.0–0.1)
Basophils Relative: 1 %
Eosinophils Absolute: 0.1 K/uL (ref 0.0–0.5)
Eosinophils Relative: 2 %
HCT: 24.2 % — ABNORMAL LOW (ref 39.0–52.0)
Hemoglobin: 8.1 g/dL — ABNORMAL LOW (ref 13.0–17.0)
Immature Granulocytes: 1 %
Lymphocytes Relative: 29 %
Lymphs Abs: 1.6 K/uL (ref 0.7–4.0)
MCH: 28.1 pg (ref 26.0–34.0)
MCHC: 33.5 g/dL (ref 30.0–36.0)
MCV: 84 fL (ref 80.0–100.0)
Monocytes Absolute: 0.5 K/uL (ref 0.1–1.0)
Monocytes Relative: 8 %
Neutro Abs: 3.2 K/uL (ref 1.7–7.7)
Neutrophils Relative %: 59 %
Platelet Count: 408 K/uL — ABNORMAL HIGH (ref 150–400)
RBC: 2.88 MIL/uL — ABNORMAL LOW (ref 4.22–5.81)
RDW: 19.1 % — ABNORMAL HIGH (ref 11.5–15.5)
WBC Count: 5.4 K/uL (ref 4.0–10.5)
nRBC: 0 % (ref 0.0–0.2)

## 2023-08-18 LAB — IRON AND IRON BINDING CAPACITY (CC-WL,HP ONLY)
Iron: 158 ug/dL (ref 45–182)
Saturation Ratios: 97 % — ABNORMAL HIGH (ref 17.9–39.5)
TIBC: 164 ug/dL — ABNORMAL LOW (ref 250–450)
UIBC: 6 ug/dL — ABNORMAL LOW (ref 117–376)

## 2023-08-18 LAB — FERRITIN: Ferritin: 534 ng/mL — ABNORMAL HIGH (ref 24–336)

## 2023-08-18 LAB — VITAMIN B12: Vitamin B-12: 424 pg/mL (ref 180–914)

## 2023-08-18 LAB — FOLATE: Folate: >40 ng/mL (ref >5.9)

## 2023-08-18 LAB — LACTATE DEHYDROGENASE: LDH: 234 U/L — ABNORMAL HIGH (ref 98–192)

## 2023-08-19 ENCOUNTER — Encounter: Payer: Self-pay | Admitting: Physician Assistant

## 2023-08-19 ENCOUNTER — Other Ambulatory Visit: Payer: Self-pay | Admitting: Physician Assistant

## 2023-08-19 DIAGNOSIS — D599 Acquired hemolytic anemia, unspecified: Secondary | ICD-10-CM

## 2023-08-19 LAB — HAPTOGLOBIN: Haptoglobin: <10 mg/dL — ABNORMAL LOW (ref 38–329)

## 2023-08-19 MED ORDER — PREDNISONE 10 MG PO TABS: 10.0000 mg | ORAL_TABLET | Freq: Every day | ORAL | 1 refills | Status: DC

## 2023-08-20 ENCOUNTER — Other Ambulatory Visit: Payer: Self-pay | Admitting: Pharmacy Technician

## 2023-08-20 ENCOUNTER — Other Ambulatory Visit: Payer: Self-pay

## 2023-08-20 NOTE — Progress Notes (Signed)
 Specialty Pharmacy Refill Coordination Note  Arthur [name] is a 83 y.o. male contacted today regarding refills of specialty medication(s) Alectinib HCl (Alecensa )   Patient requested Delivery   Delivery date: 08/26/23   Verified address: [address]   [place] [address]   Medication will be filled on 08/25/23.   Spoke to patient's daughter.

## 2023-08-25 ENCOUNTER — Other Ambulatory Visit: Payer: Self-pay

## 2023-08-26 ENCOUNTER — Telehealth: Payer: Self-pay

## 2023-08-26 NOTE — Telephone Encounter (Signed)
 Notified the pt daughter regarding her FMLA forms. I let her know that the [facility], and that they need to go to the pt Primary Physician. I let her know that I will faxed her forms to his PCP. Ms.Robenson verbalized understanding. No questions or concerns to be noted at this time.

## 2023-08-28 ENCOUNTER — Telehealth: Payer: Self-pay

## 2023-08-28 NOTE — Telephone Encounter (Signed)
-----   Message from Elyce Prescott sent at 08/27/2023  3:43 PM EDT ----- Regarding: Appt schedule Can you all schedule an appointment for FMLA paper work completion? I received the forms in my box but need additional info to fill it out  Thanks, [employer]

## 2023-08-28 NOTE — Telephone Encounter (Signed)
 Called and left a voice mail for the son regarding the patient. Informed the patient son to give us  a call back so we can schedule his father for an appt.

## 2023-08-29 ENCOUNTER — Telehealth: Payer: Self-pay | Admitting: Family Medicine

## 2023-08-29 ENCOUNTER — Ambulatory Visit: Payer: Self-pay

## 2023-08-29 NOTE — Telephone Encounter (Signed)
 Received after hours call from pt's daughter in law Pt is having severe knee pain and unable to bear weight Asking for pain medication to get through the weekend Advised that he be seen in person to evaluate his pain to ensure he doesn't have a DVT

## 2023-09-01 ENCOUNTER — Other Ambulatory Visit: Payer: Self-pay | Admitting: Family Medicine

## 2023-09-01 DIAGNOSIS — I2699 Other pulmonary embolism without acute cor pulmonale: Secondary | ICD-10-CM

## 2023-09-04 ENCOUNTER — Telehealth: Payer: Self-pay

## 2023-09-04 NOTE — Telephone Encounter (Signed)
 Patient has an appt scheduled 08/12 @10 :10 with Dr.Norbert

## 2023-09-04 NOTE — Telephone Encounter (Signed)
-----   Message from [facility] sent at 09/03/2023  3:49 PM EDT ----- Regarding: Appt Hey,  Can we schedule him an appt to go over FMLA paperwork for his daughter?  Thanks, [employer]

## 2023-09-04 NOTE — Telephone Encounter (Signed)
 Spoke with patient to make appt with PCP in Sept. But patient stated that he needed to be seen before then. So I made appt with Dr. Rosendo for 8/12 at 10:10. Patient wanted to know why he had to come in? Wanted know if this was something that could just be done over the phone? Nelson Land, CMA

## 2023-09-08 NOTE — Telephone Encounter (Signed)
 Spoke with son. He stated that he has the paperwork and that they will keep appt for tomorrow. Nelson Land, CMA

## 2023-09-09 ENCOUNTER — Other Ambulatory Visit: Payer: Self-pay | Admitting: Internal Medicine

## 2023-09-09 ENCOUNTER — Ambulatory Visit (INDEPENDENT_AMBULATORY_CARE_PROVIDER_SITE_OTHER): Admitting: Student

## 2023-09-09 VITALS — BP 121/52 | HR 99

## 2023-09-09 DIAGNOSIS — F419 Anxiety disorder, unspecified: Secondary | ICD-10-CM | POA: Diagnosis not present

## 2023-09-09 DIAGNOSIS — M21372 Foot drop, left foot: Secondary | ICD-10-CM | POA: Diagnosis not present

## 2023-09-09 DIAGNOSIS — C3491 Malignant neoplasm of unspecified part of right bronchus or lung: Secondary | ICD-10-CM

## 2023-09-09 MED ORDER — BUSPIRONE HCL 5 MG PO TABS: 5.0000 mg | ORAL_TABLET | Freq: Three times a day (TID) | ORAL | 9 refills | Status: DC

## 2023-09-09 MED ORDER — CLOPIDOGREL BISULFATE 75 MG PO TABS: 75.0000 mg | ORAL_TABLET | Freq: Every day | ORAL | 6 refills | Status: DC

## 2023-09-09 MED ORDER — BUSPIRONE HCL 5 MG PO TABS: 5.0000 mg | ORAL_TABLET | Freq: Two times a day (BID) | ORAL | 9 refills | Status: DC

## 2023-09-09 NOTE — Progress Notes (Signed)
    SUBJECTIVE:   CHIEF COMPLAINT / HPI:   Arthur [name] is an 83 year old male who presents for completion of an FMLA form. He is accompanied by his son.  He is wheelchair-bound due to a previous stroke, resulting in foot drop. He experiences muscular pain in his knee and leg, along with general weakness, impacting daily activities like bathing. He attributes some pain to his sleeping position on a hospital-style bed.  He uses Plavix  and requests a refill for Buspirone , previously effective for anxiety. Chronic leg swelling is managed with compression stockings. No shortness of breath. Tylenol  is used for pain management.  PERTINENT  PMH / PSH: Reviewed   OBJECTIVE:   BP (!) 121/52   Pulse 99   SpO2 100%    Physical Exam General: Alert, wheelchair dependent, NAD Cardiovascular: RRR, No Murmurs, Normal S2/S2 Respiratory: CTAB, No wheezing or Rales Abdomen: No distension or tenderness Extremities: 2+ BLE edema  ASSESSMENT/PLAN:   Foot drop, left foot Chronic condition and wheel chair dependent.  Given comorbidities including history of stroke, type 2 diabetes and generalized weakness likely from deconditioning patient has had difficulty performing ADLs and has required increased care his daughter softness his caregiver.  Pleated FMLA form for patient care giver.  Form placed in the cabinet for documents to be faxed   Chronic lower extremity Edema Chronic swelling and pain likely due to venous insufficiency and prolonged wheelchair use.  - Await lab results from oncology appointment for electrolyte imbalance. - Recommend compression stockings for swelling management.  Anxiety disorder Anxiety disorder previously managed with buspirone , discontinued in 2023. Buspirone  was effective without adverse effects. - Restart buspirone  for anxiety management.    Norleen April, MD [facility]

## 2023-09-09 NOTE — Assessment & Plan Note (Signed)
 Chronic condition and wheel chair dependent.  Given comorbidities including history of stroke, type 2 diabetes and generalized weakness likely from deconditioning patient has had difficulty performing ADLs and has required increased care his daughter softness his caregiver.  Pleated FMLA form for patient care giver.  Form placed in the cabinet for documents to be faxed

## 2023-09-09 NOTE — Patient Instructions (Signed)
 Pleasure to meet you today.  I will complete your FMLA form and fax it by end of today.  I have also refilled your Plavix .  I have restarted your BuSpar  for anxiety which you will take 5 mg 2 times daily.

## 2023-09-10 ENCOUNTER — Inpatient Hospital Stay: Attending: Physician Assistant

## 2023-09-10 DIAGNOSIS — C3491 Malignant neoplasm of unspecified part of right bronchus or lung: Secondary | ICD-10-CM

## 2023-09-10 DIAGNOSIS — D649 Anemia, unspecified: Secondary | ICD-10-CM | POA: Diagnosis not present

## 2023-09-10 DIAGNOSIS — C3490 Malignant neoplasm of unspecified part of unspecified bronchus or lung: Secondary | ICD-10-CM | POA: Insufficient documentation

## 2023-09-10 LAB — CBC WITH DIFFERENTIAL (CANCER CENTER ONLY)
Abs Immature Granulocytes: 0.05 K/uL (ref 0.00–0.07)
Basophils Absolute: 0 K/uL (ref 0.0–0.1)
Basophils Relative: 1 %
Eosinophils Absolute: 0.1 K/uL (ref 0.0–0.5)
Eosinophils Relative: 1 %
HCT: 26.9 % — ABNORMAL LOW (ref 39.0–52.0)
Hemoglobin: 9.2 g/dL — ABNORMAL LOW (ref 13.0–17.0)
Immature Granulocytes: 1 %
Lymphocytes Relative: 13 %
Lymphs Abs: 1 K/uL (ref 0.7–4.0)
MCH: 28 pg (ref 26.0–34.0)
MCHC: 34.2 g/dL (ref 30.0–36.0)
MCV: 82 fL (ref 80.0–100.0)
Monocytes Absolute: 0.3 K/uL (ref 0.1–1.0)
Monocytes Relative: 4 %
Neutro Abs: 6.2 K/uL (ref 1.7–7.7)
Neutrophils Relative %: 80 %
Platelet Count: 436 K/uL — ABNORMAL HIGH (ref 150–400)
RBC: 3.28 MIL/uL — ABNORMAL LOW (ref 4.22–5.81)
RDW: 18.6 % — ABNORMAL HIGH (ref 11.5–15.5)
WBC Count: 7.7 K/uL (ref 4.0–10.5)
nRBC: 0.4 % — ABNORMAL HIGH (ref 0.0–0.2)

## 2023-09-10 LAB — CMP (CANCER CENTER ONLY)
ALT: 20 U/L (ref 0–44)
AST: 26 U/L (ref 15–41)
Albumin: 2.7 g/dL — ABNORMAL LOW (ref 3.5–5.0)
Alkaline Phosphatase: 64 U/L (ref 38–126)
Anion gap: 4 — ABNORMAL LOW (ref 5–15)
BUN: 16 mg/dL (ref 8–23)
CO2: 30 mmol/L (ref 22–32)
Calcium: 7.8 mg/dL — ABNORMAL LOW (ref 8.9–10.3)
Chloride: 105 mmol/L (ref 98–111)
Creatinine: 1.17 mg/dL (ref 0.61–1.24)
GFR, Estimated: >60 mL/min (ref >60)
Glucose, Bld: 114 mg/dL — ABNORMAL HIGH (ref 70–99)
Potassium: 4.2 mmol/L (ref 3.5–5.1)
Sodium: 139 mmol/L (ref 135–145)
Total Bilirubin: 1.3 mg/dL — ABNORMAL HIGH (ref 0.0–1.2)
Total Protein: 5.6 g/dL — ABNORMAL LOW (ref 6.5–8.1)

## 2023-09-11 ENCOUNTER — Other Ambulatory Visit: Payer: Self-pay

## 2023-09-11 ENCOUNTER — Telehealth: Payer: Self-pay

## 2023-09-11 ENCOUNTER — Encounter: Payer: Self-pay | Admitting: Family Medicine

## 2023-09-11 ENCOUNTER — Encounter (INDEPENDENT_AMBULATORY_CARE_PROVIDER_SITE_OTHER): Payer: Self-pay

## 2023-09-11 NOTE — Telephone Encounter (Signed)
 [name] calls nurse line regarding pain medication.   She reports that when patient was seen in the office with Dr. Rosendo on 8/12, they were told that he could not receive pain medication until he had labs drawn.   Patient had labs drawn at oncologist yesterday.   Daughter is asking if pain medication can now be prescribed.   Forwarding to Dr. Rosendo.   Chiquita JAYSON English, RN

## 2023-09-12 ENCOUNTER — Other Ambulatory Visit: Payer: Self-pay

## 2023-09-12 ENCOUNTER — Other Ambulatory Visit (HOSPITAL_COMMUNITY): Payer: Self-pay

## 2023-09-12 NOTE — Progress Notes (Signed)
 Specialty Pharmacy Refill Coordination Note  MyChart Questionnaire Submission  Arthur [name] is a 83 y.o. male contacted today regarding refills of specialty medication(s) Alecensa .  Doses on hand: (Patient-Rptd) 7 days - Patient should have around 96 capules/12 days remaining.  Patient requested: (Patient-Rptd) Delivery   Delivery date: 09/18/23  Verified address: [address] [place] [address]  Medication will be filled on 09/17/23.

## 2023-09-14 ENCOUNTER — Other Ambulatory Visit: Payer: Self-pay

## 2023-09-14 ENCOUNTER — Observation Stay (HOSPITAL_COMMUNITY)

## 2023-09-14 ENCOUNTER — Emergency Department (HOSPITAL_COMMUNITY)

## 2023-09-14 ENCOUNTER — Observation Stay (HOSPITAL_BASED_OUTPATIENT_CLINIC_OR_DEPARTMENT_OTHER)

## 2023-09-14 ENCOUNTER — Encounter (HOSPITAL_COMMUNITY): Payer: Self-pay

## 2023-09-14 ENCOUNTER — Inpatient Hospital Stay (HOSPITAL_COMMUNITY)
Admission: EM | Admit: 2023-09-14 | Discharge: 2023-09-23 | DRG: 558 | Disposition: A | Discharge status: Skilled Nursing Facility | Attending: Family Medicine | Admitting: Family Medicine

## 2023-09-14 DIAGNOSIS — Z993 Dependence on wheelchair: Secondary | ICD-10-CM | POA: Diagnosis not present

## 2023-09-14 DIAGNOSIS — Z888 Allergy status to other drugs, medicaments and biological substances status: Secondary | ICD-10-CM

## 2023-09-14 DIAGNOSIS — Z7902 Long term (current) use of antithrombotics/antiplatelets: Secondary | ICD-10-CM

## 2023-09-14 DIAGNOSIS — M7582 Other shoulder lesions, left shoulder: Secondary | ICD-10-CM | POA: Diagnosis not present

## 2023-09-14 DIAGNOSIS — Z79899 Other long term (current) drug therapy: Secondary | ICD-10-CM | POA: Diagnosis not present

## 2023-09-14 DIAGNOSIS — T451X5A Adverse effect of antineoplastic and immunosuppressive drugs, initial encounter: Secondary | ICD-10-CM | POA: Diagnosis present

## 2023-09-14 DIAGNOSIS — R059 Cough, unspecified: Secondary | ICD-10-CM | POA: Diagnosis present

## 2023-09-14 DIAGNOSIS — R4781 Slurred speech: Secondary | ICD-10-CM | POA: Diagnosis not present

## 2023-09-14 DIAGNOSIS — M7989 Other specified soft tissue disorders: Secondary | ICD-10-CM

## 2023-09-14 DIAGNOSIS — Z955 Presence of coronary angioplasty implant and graft: Secondary | ICD-10-CM | POA: Diagnosis not present

## 2023-09-14 DIAGNOSIS — Z7969 Long term (current) use of other immunomodulators and immunosuppressants: Secondary | ICD-10-CM

## 2023-09-14 DIAGNOSIS — G9389 Other specified disorders of brain: Secondary | ICD-10-CM | POA: Diagnosis not present

## 2023-09-14 DIAGNOSIS — I82409 Acute embolism and thrombosis of unspecified deep veins of unspecified lower extremity: Secondary | ICD-10-CM | POA: Diagnosis not present

## 2023-09-14 DIAGNOSIS — R29810 Facial weakness: Secondary | ICD-10-CM | POA: Diagnosis present

## 2023-09-14 DIAGNOSIS — I82452 Acute embolism and thrombosis of left peroneal vein: Secondary | ICD-10-CM | POA: Diagnosis not present

## 2023-09-14 DIAGNOSIS — F419 Anxiety disorder, unspecified: Secondary | ICD-10-CM | POA: Diagnosis present

## 2023-09-14 DIAGNOSIS — D589 Hereditary hemolytic anemia, unspecified: Secondary | ICD-10-CM | POA: Diagnosis present

## 2023-09-14 DIAGNOSIS — M25412 Effusion, left shoulder: Secondary | ICD-10-CM | POA: Diagnosis not present

## 2023-09-14 DIAGNOSIS — R569 Unspecified convulsions: Secondary | ICD-10-CM | POA: Diagnosis not present

## 2023-09-14 DIAGNOSIS — Z789 Other specified health status: Secondary | ICD-10-CM

## 2023-09-14 DIAGNOSIS — I2510 Atherosclerotic heart disease of native coronary artery without angina pectoris: Secondary | ICD-10-CM | POA: Diagnosis present

## 2023-09-14 DIAGNOSIS — M25512 Pain in left shoulder: Secondary | ICD-10-CM | POA: Diagnosis present

## 2023-09-14 DIAGNOSIS — R4182 Altered mental status, unspecified: Secondary | ICD-10-CM | POA: Diagnosis not present

## 2023-09-14 DIAGNOSIS — R531 Weakness: Secondary | ICD-10-CM | POA: Diagnosis not present

## 2023-09-14 DIAGNOSIS — I951 Orthostatic hypotension: Secondary | ICD-10-CM

## 2023-09-14 DIAGNOSIS — Z7901 Long term (current) use of anticoagulants: Secondary | ICD-10-CM | POA: Diagnosis not present

## 2023-09-14 DIAGNOSIS — M1712 Unilateral primary osteoarthritis, left knee: Secondary | ICD-10-CM | POA: Diagnosis not present

## 2023-09-14 DIAGNOSIS — Y92013 Bedroom of single-family (private) house as the place of occurrence of the external cause: Secondary | ICD-10-CM

## 2023-09-14 DIAGNOSIS — I82442 Acute embolism and thrombosis of left tibial vein: Secondary | ICD-10-CM | POA: Diagnosis present

## 2023-09-14 DIAGNOSIS — E785 Hyperlipidemia, unspecified: Secondary | ICD-10-CM | POA: Diagnosis not present

## 2023-09-14 DIAGNOSIS — M25519 Pain in unspecified shoulder: Secondary | ICD-10-CM | POA: Diagnosis not present

## 2023-09-14 DIAGNOSIS — M21372 Foot drop, left foot: Secondary | ICD-10-CM | POA: Diagnosis present

## 2023-09-14 DIAGNOSIS — I10 Essential (primary) hypertension: Secondary | ICD-10-CM | POA: Diagnosis present

## 2023-09-14 DIAGNOSIS — R29818 Other symptoms and signs involving the nervous system: Secondary | ICD-10-CM | POA: Diagnosis not present

## 2023-09-14 DIAGNOSIS — I639 Cerebral infarction, unspecified: Secondary | ICD-10-CM | POA: Diagnosis not present

## 2023-09-14 DIAGNOSIS — M75102 Unspecified rotator cuff tear or rupture of left shoulder, not specified as traumatic: Secondary | ICD-10-CM

## 2023-09-14 DIAGNOSIS — R638 Other symptoms and signs concerning food and fluid intake: Secondary | ICD-10-CM | POA: Diagnosis not present

## 2023-09-14 DIAGNOSIS — M75122 Complete rotator cuff tear or rupture of left shoulder, not specified as traumatic: Secondary | ICD-10-CM | POA: Diagnosis not present

## 2023-09-14 DIAGNOSIS — T148XXA Other injury of unspecified body region, initial encounter: Secondary | ICD-10-CM | POA: Diagnosis present

## 2023-09-14 DIAGNOSIS — Z86718 Personal history of other venous thrombosis and embolism: Secondary | ICD-10-CM

## 2023-09-14 DIAGNOSIS — Z91041 Radiographic dye allergy status: Secondary | ICD-10-CM

## 2023-09-14 DIAGNOSIS — S82402D Unspecified fracture of shaft of left fibula, subsequent encounter for closed fracture with routine healing: Secondary | ICD-10-CM | POA: Diagnosis not present

## 2023-09-14 DIAGNOSIS — K219 Gastro-esophageal reflux disease without esophagitis: Secondary | ICD-10-CM | POA: Diagnosis not present

## 2023-09-14 DIAGNOSIS — D599 Acquired hemolytic anemia, unspecified: Secondary | ICD-10-CM

## 2023-09-14 DIAGNOSIS — M79622 Pain in left upper arm: Secondary | ICD-10-CM | POA: Diagnosis not present

## 2023-09-14 DIAGNOSIS — I499 Cardiac arrhythmia, unspecified: Secondary | ICD-10-CM | POA: Diagnosis not present

## 2023-09-14 DIAGNOSIS — M19012 Primary osteoarthritis, left shoulder: Secondary | ICD-10-CM | POA: Diagnosis not present

## 2023-09-14 DIAGNOSIS — E119 Type 2 diabetes mellitus without complications: Secondary | ICD-10-CM | POA: Diagnosis present

## 2023-09-14 DIAGNOSIS — R64 Cachexia: Secondary | ICD-10-CM | POA: Diagnosis not present

## 2023-09-14 DIAGNOSIS — D638 Anemia in other chronic diseases classified elsewhere: Secondary | ICD-10-CM | POA: Diagnosis not present

## 2023-09-14 DIAGNOSIS — C3490 Malignant neoplasm of unspecified part of unspecified bronchus or lung: Secondary | ICD-10-CM | POA: Diagnosis present

## 2023-09-14 DIAGNOSIS — I480 Paroxysmal atrial fibrillation: Secondary | ICD-10-CM | POA: Diagnosis present

## 2023-09-14 DIAGNOSIS — Z6821 Body mass index (BMI) 21.0-21.9, adult: Secondary | ICD-10-CM

## 2023-09-14 DIAGNOSIS — Z7952 Long term (current) use of systemic steroids: Secondary | ICD-10-CM

## 2023-09-14 DIAGNOSIS — W06XXXA Fall from bed, initial encounter: Secondary | ICD-10-CM | POA: Diagnosis present

## 2023-09-14 DIAGNOSIS — D649 Anemia, unspecified: Principal | ICD-10-CM | POA: Diagnosis present

## 2023-09-14 DIAGNOSIS — Z86711 Personal history of pulmonary embolism: Secondary | ICD-10-CM

## 2023-09-14 DIAGNOSIS — E440 Moderate protein-calorie malnutrition: Secondary | ICD-10-CM | POA: Diagnosis present

## 2023-09-14 DIAGNOSIS — R112 Nausea with vomiting, unspecified: Secondary | ICD-10-CM | POA: Diagnosis not present

## 2023-09-14 DIAGNOSIS — R110 Nausea: Secondary | ICD-10-CM

## 2023-09-14 DIAGNOSIS — S46812A Strain of other muscles, fascia and tendons at shoulder and upper arm level, left arm, initial encounter: Secondary | ICD-10-CM | POA: Diagnosis not present

## 2023-09-14 DIAGNOSIS — J189 Pneumonia, unspecified organism: Secondary | ICD-10-CM | POA: Diagnosis not present

## 2023-09-14 LAB — BASIC METABOLIC PANEL WITH GFR
Anion gap: 9 (ref 5–15)
BUN: 17 mg/dL (ref 8–23)
CO2: 23 mmol/L (ref 22–32)
Calcium: 7.8 mg/dL — ABNORMAL LOW (ref 8.9–10.3)
Chloride: 106 mmol/L (ref 98–111)
Creatinine, Ser: 1.3 mg/dL — ABNORMAL HIGH (ref 0.61–1.24)
GFR, Estimated: 55 mL/min — ABNORMAL LOW (ref >60)
Glucose, Bld: 84 mg/dL (ref 70–99)
Potassium: 4.6 mmol/L (ref 3.5–5.1)
Sodium: 138 mmol/L (ref 135–145)

## 2023-09-14 LAB — CBC WITH DIFFERENTIAL/PLATELET
Abs Immature Granulocytes: 0.07 K/uL (ref 0.00–0.07)
Abs Immature Granulocytes: 0.07 K/uL (ref 0.00–0.07)
Abs Immature Granulocytes: 0.07 K/uL (ref 0.00–0.07)
Basophils Absolute: 0 K/uL (ref 0.0–0.1)
Basophils Absolute: 0 K/uL (ref 0.0–0.1)
Basophils Absolute: 0.1 K/uL (ref 0.0–0.1)
Basophils Relative: 0 %
Basophils Relative: 0 %
Basophils Relative: 1 %
Eosinophils Absolute: 0.1 K/uL (ref 0.0–0.5)
Eosinophils Absolute: 0.1 K/uL (ref 0.0–0.5)
Eosinophils Absolute: 0.1 K/uL (ref 0.0–0.5)
Eosinophils Relative: 2 %
Eosinophils Relative: 2 %
Eosinophils Relative: 1 %
HCT: 22.9 % — ABNORMAL LOW (ref 39.0–52.0)
HCT: 25 % — ABNORMAL LOW (ref 39.0–52.0)
HCT: 25.7 % — ABNORMAL LOW (ref 39.0–52.0)
Hemoglobin: 7.5 g/dL — ABNORMAL LOW (ref 13.0–17.0)
Hemoglobin: 7.9 g/dL — ABNORMAL LOW (ref 13.0–17.0)
Hemoglobin: 8.4 g/dL — ABNORMAL LOW (ref 13.0–17.0)
Immature Granulocytes: 1 %
Immature Granulocytes: 1 %
Immature Granulocytes: 1 %
Lymphocytes Relative: 22 %
Lymphocytes Relative: 23 %
Lymphocytes Relative: 18 %
Lymphs Abs: 1.5 K/uL (ref 0.7–4.0)
Lymphs Abs: 1.6 K/uL (ref 0.7–4.0)
Lymphs Abs: 1.3 K/uL (ref 0.7–4.0)
MCH: 27.6 pg (ref 26.0–34.0)
MCH: 27.8 pg (ref 26.0–34.0)
MCH: 27.4 pg (ref 26.0–34.0)
MCHC: 32.8 g/dL (ref 30.0–36.0)
MCHC: 31.6 g/dL (ref 30.0–36.0)
MCHC: 32.7 g/dL (ref 30.0–36.0)
MCV: 84.2 fL (ref 80.0–100.0)
MCV: 88 fL (ref 80.0–100.0)
MCV: 83.7 fL (ref 80.0–100.0)
Monocytes Absolute: 0.7 K/uL (ref 0.1–1.0)
Monocytes Absolute: 0.7 K/uL (ref 0.1–1.0)
Monocytes Absolute: 0.4 K/uL (ref 0.1–1.0)
Monocytes Relative: 10 %
Monocytes Relative: 10 %
Monocytes Relative: 6 %
Neutro Abs: 4.6 K/uL (ref 1.7–7.7)
Neutro Abs: 4.4 K/uL (ref 1.7–7.7)
Neutro Abs: 5.1 K/uL (ref 1.7–7.7)
Neutrophils Relative %: 65 %
Neutrophils Relative %: 64 %
Neutrophils Relative %: 73 %
Platelets: 418 K/uL — ABNORMAL HIGH (ref 150–400)
Platelets: 387 K/uL (ref 150–400)
Platelets: 420 K/uL — ABNORMAL HIGH (ref 150–400)
RBC: 2.72 MIL/uL — ABNORMAL LOW (ref 4.22–5.81)
RBC: 2.84 MIL/uL — ABNORMAL LOW (ref 4.22–5.81)
RBC: 3.07 MIL/uL — ABNORMAL LOW (ref 4.22–5.81)
RDW: 18.5 % — ABNORMAL HIGH (ref 11.5–15.5)
RDW: 18.1 % — ABNORMAL HIGH (ref 11.5–15.5)
RDW: 18.6 % — ABNORMAL HIGH (ref 11.5–15.5)
WBC: 7 K/uL (ref 4.0–10.5)
WBC: 6.9 K/uL (ref 4.0–10.5)
WBC: 7 K/uL (ref 4.0–10.5)
nRBC: 0 % (ref 0.0–0.2)
nRBC: 0 % (ref 0.0–0.2)
nRBC: 0.3 % — ABNORMAL HIGH (ref 0.0–0.2)

## 2023-09-14 LAB — COMPREHENSIVE METABOLIC PANEL WITH GFR
ALT: 21 U/L (ref 0–44)
AST: 24 U/L (ref 15–41)
Albumin: 1.7 g/dL — ABNORMAL LOW (ref 3.5–5.0)
Alkaline Phosphatase: 50 U/L (ref 38–126)
Anion gap: 8 (ref 5–15)
BUN: 16 mg/dL (ref 8–23)
CO2: 27 mmol/L (ref 22–32)
Calcium: 7.9 mg/dL — ABNORMAL LOW (ref 8.9–10.3)
Chloride: 102 mmol/L (ref 98–111)
Creatinine, Ser: 1.27 mg/dL — ABNORMAL HIGH (ref 0.61–1.24)
GFR, Estimated: 56 mL/min — ABNORMAL LOW (ref >60)
Glucose, Bld: 101 mg/dL — ABNORMAL HIGH (ref 70–99)
Potassium: 4.4 mmol/L (ref 3.5–5.1)
Sodium: 137 mmol/L (ref 135–145)
Total Bilirubin: 1.4 mg/dL — ABNORMAL HIGH (ref 0.0–1.2)
Total Protein: 4.7 g/dL — ABNORMAL LOW (ref 6.5–8.1)

## 2023-09-14 LAB — URINALYSIS, ROUTINE W REFLEX MICROSCOPIC
Bilirubin Urine: NEGATIVE
Glucose, UA: NEGATIVE mg/dL
Hgb urine dipstick: NEGATIVE
Ketones, ur: NEGATIVE mg/dL
Leukocytes,Ua: NEGATIVE
Nitrite: NEGATIVE
Protein, ur: NEGATIVE mg/dL
Specific Gravity, Urine: 1.008 (ref 1.005–1.030)
pH: 6 (ref 5.0–8.0)

## 2023-09-14 LAB — BILIRUBIN, FRACTIONATED(TOT/DIR/INDIR)
Bilirubin, Direct: 0.4 mg/dL — ABNORMAL HIGH (ref 0.0–0.2)
Indirect Bilirubin: 0.7 mg/dL (ref 0.3–0.9)
Total Bilirubin: 1.1 mg/dL (ref 0.0–1.2)

## 2023-09-14 LAB — RETICULOCYTES
Immature Retic Fract: 10.4 % (ref 2.3–15.9)
RBC.: 2.81 MIL/uL — ABNORMAL LOW (ref 4.22–5.81)
Retic Count, Absolute: 55.1 K/uL (ref 19.0–186.0)
Retic Ct Pct: 2 % (ref 0.4–3.1)

## 2023-09-14 LAB — LACTATE DEHYDROGENASE: LDH: 260 U/L — ABNORMAL HIGH (ref 98–192)

## 2023-09-14 LAB — PROTIME-INR
INR: 1.1 (ref 0.8–1.2)
Prothrombin Time: 14.7 s (ref 11.4–15.2)

## 2023-09-14 LAB — POC OCCULT BLOOD, ED: Fecal Occult Bld: NEGATIVE

## 2023-09-14 MED ORDER — CLOPIDOGREL BISULFATE 75 MG PO TABS
75.0000 mg | ORAL_TABLET | Freq: Every day | ORAL | Status: DC
  Administered 2023-09-14 – 2023-09-23 (×10): 75 mg via ORAL
  Filled 2023-09-14 (×10): qty 1

## 2023-09-14 MED ORDER — ROSUVASTATIN CALCIUM 5 MG PO TABS
5.0000 mg | ORAL_TABLET | Freq: Every day | ORAL | Status: DC
  Administered 2023-09-14 – 2023-09-23 (×10): 5 mg via ORAL
  Filled 2023-09-14 (×10): qty 1

## 2023-09-14 MED ORDER — OXYCODONE HCL 5 MG PO TABS
2.5000 mg | ORAL_TABLET | ORAL | Status: DC | PRN
  Administered 2023-09-14 – 2023-09-16 (×5): 2.5 mg via ORAL
  Filled 2023-09-14 (×5): qty 1

## 2023-09-14 MED ORDER — PREDNISONE 10 MG PO TABS
10.0000 mg | ORAL_TABLET | Freq: Every day | ORAL | Status: DC
  Administered 2023-09-14 – 2023-09-23 (×10): 10 mg via ORAL
  Filled 2023-09-14 (×10): qty 1

## 2023-09-14 MED ORDER — CALCIUM GLUCONATE-NACL 1-0.675 GM/50ML-% IV SOLN
1.0000 g | Freq: Once | INTRAVENOUS | Status: DC
Start: 2023-09-14 — End: 2023-09-14

## 2023-09-14 MED ORDER — ALECTINIB HCL 150 MG PO CAPS
600.0000 mg | ORAL_CAPSULE | Freq: Two times a day (BID) | ORAL | Status: DC
  Administered 2023-09-14 – 2023-09-17 (×5): 600 mg via ORAL
  Filled 2023-09-14 (×12): qty 4

## 2023-09-14 MED ORDER — OXYCODONE HCL 5 MG PO TABS
2.5000 mg | ORAL_TABLET | Freq: Once | ORAL | Status: AC
  Administered 2023-09-14: 2.5 mg via ORAL
  Filled 2023-09-14: qty 1

## 2023-09-14 MED ORDER — ACETAMINOPHEN 500 MG PO TABS
1000.0000 mg | ORAL_TABLET | Freq: Four times a day (QID) | ORAL | Status: DC
  Administered 2023-09-14 – 2023-09-23 (×19): 1000 mg via ORAL
  Filled 2023-09-14 (×26): qty 2

## 2023-09-14 MED ORDER — ACETAMINOPHEN 325 MG PO TABS: 650.0000 mg | ORAL_TABLET | Freq: Once | ORAL | Status: DC

## 2023-09-14 MED ORDER — PANTOPRAZOLE SODIUM 40 MG PO TBEC
40.0000 mg | DELAYED_RELEASE_TABLET | Freq: Every day | ORAL | Status: DC
  Administered 2023-09-14: 40 mg via ORAL
  Filled 2023-09-14: qty 1

## 2023-09-14 MED ORDER — PANTOPRAZOLE SODIUM 40 MG PO TBEC
40.0000 mg | DELAYED_RELEASE_TABLET | Freq: Two times a day (BID) | ORAL | Status: DC
  Administered 2023-09-14 – 2023-09-23 (×18): 40 mg via ORAL
  Filled 2023-09-14 (×18): qty 1

## 2023-09-14 MED ORDER — LIDOCAINE 5 % EX PTCH
1.0000 | MEDICATED_PATCH | CUTANEOUS | Status: DC
  Administered 2023-09-14 – 2023-09-23 (×7): 1 via TRANSDERMAL
  Filled 2023-09-14 (×10): qty 1

## 2023-09-14 MED ORDER — ENOXAPARIN SODIUM 80 MG/0.8ML IJ SOSY
70.0000 mg | PREFILLED_SYRINGE | Freq: Two times a day (BID) | INTRAMUSCULAR | Status: DC
  Administered 2023-09-14 – 2023-09-23 (×18): 70 mg via SUBCUTANEOUS
  Filled 2023-09-14 (×3): qty 0.8
  Filled 2023-09-14: qty 0.7
  Filled 2023-09-14 (×15): qty 0.8

## 2023-09-14 MED ORDER — BUSPIRONE HCL 5 MG PO TABS
5.0000 mg | ORAL_TABLET | Freq: Two times a day (BID) | ORAL | Status: DC
  Administered 2023-09-14 – 2023-09-23 (×19): 5 mg via ORAL
  Filled 2023-09-14 (×19): qty 1

## 2023-09-14 NOTE — Evaluation (Signed)
 Physical Therapy Evaluation Patient Details Name: ADOM SCHOENECK MRN: [id number] DOB: 01/19/1941 Today's Date: 09/14/2023  History of Present Illness  Admitted with L shoulder pain, onset 2 weeks prior to admission; pin began with LUE use to pull self over in bed; no bony abnormality on xray, MRI shows Severe supraspinatus tendinosis with a high-grade  partial-thickness bursal surface tear along the anterior insertion  measuring 9 mm AP, 7.5 mm medial-lateral and a high-grade  partial-thickness articular surface tear just proximal to the  insertion measuring 6 mm medial-lateral and 6 mm AP. Moderate-severe  infraspinatus tendinosis. Teres minor tendon is intact. Large  full-thickness tear of the subscapularis tendon at the  musculotendinous junction, hematoma; uses RW short distance in home; wc for community access, doctor's visits;  has a past medical history of Diabetes mellitus without complication (HCC), High cholesterol, Hypertension, lung ca, and Normal nuclear stress test (01/29/2008).  Clinical Impression   Pt admitted with above diagnosis. Lives at home with family, in a 2-level home (pt can stay on first level) with a level entry; Prior to admission, pt was able to get up and walk household distances with RW, used wheelchair for MD visits; Presents to PT with significant pain L shoulder which limits activity tolerance and ability to move, generalized weakness (has been largely bedbound for 2 weeks since injury); needed heavy mod assist to come to sit EOB (on R side), stand up to RW, and to take small sidesteps wit RW at bedside;  Once pt has a solid pain control regimen in place, and If family is able to provide adequate physical assist, PT recommends getting home with HHPT/OT follow up; I anticipate needing another one or two PT (and OT for ADLs) sessions with pt to work on functional mobility and transfers in the setting of this L shoulder injury and incr pain, to make moving at home better,  easier, safer for pt and family; Pt currently with functional limitations due to the deficits listed below (see PT Problem List). Pt will benefit from skilled PT to increase their independence and safety with mobility to allow discharge to the venue listed below.           If plan is discharge home, recommend the following: A lot of help with walking and/or transfers;Two people to help with walking and/or transfers;A lot of help with bathing/dressing/bathroom   Can travel by private vehicle        Equipment Recommendations Wheelchair (measurements PT);Wheelchair cushion (measurements PT);BSC/3in1;Other (comment) (Can also consider hospital bed, in setting of lung Ca, previous CVA)  Recommendations for Other Services  OT consult;Other (comment) (Ortho consult)    Functional Status Assessment Patient has had a recent decline in their functional status and demonstrates the ability to make significant improvements in function in a reasonable and predictable amount of time.     Precautions / Restrictions Precautions Precautions: Fall;Other (comment) Precaution/Restrictions Comments: L shoulder exqusisitely painful with attempts at L shoulder active motion Restrictions Weight Bearing Restrictions Per Provider Order: No      Mobility  Bed Mobility Overal bed mobility: Needs Assistance Bed Mobility: Rolling, Sidelying to Sit, Sit to Supine Rolling: Mod assist Sidelying to sit: Mod assist   Sit to supine: Mod assist   General bed mobility comments: Light mod assist and tactile cueing for rolling onto R side ; Heavy mod assist to elevate trunk to sitting and use of bed pad to square off hips at EOB; Mod assist to help LEs into bed against  Transfers Overall transfer level: Needs assistance Equipment used: Rolling walker (2 wheels) Transfers: Sit to/from Stand, Bed to chair/wheelchair/BSC Sit to Stand: Mod assist   Step pivot transfers: Mod assist       General transfer  comment: Heavy mod assist to power up to stand; supported LUE/shoulder girdle at elbow and heav use of gait belt; Pt stabilized LUE, holding the front top bar of RW, locking elbow in full extension, RUE correctly on grip; a few sidesteps toward HOB, fatigued quickly; Light mod assist to stabilize during steps; heavier assist to control descent to sit    Ambulation/Gait                  Stairs            Wheelchair Mobility     Tilt Bed    Modified Rankin (Stroke Patients Only)       Balance Overall balance assessment: Needs assistance   Sitting balance-Leahy Scale: Poor       Standing balance-Leahy Scale: Poor                               Pertinent Vitals/Pain Pain Assessment Pain Assessment: Faces Pain Score: 5  (without motion) Faces Pain Scale: Hurts worst Pain Location: L shoulder with any active muscle activation Pain Descriptors / Indicators: Grimacing, Guarding Pain Intervention(s): Repositioned, RN gave pain meds during session    Home Living Family/patient expects to be discharged to:: Private residence Living Arrangements: Children Available Help at Discharge: Family;Available 24 hours/day Type of Home: House Home Access: Stairs to enter   [employer] of Steps: 1   Home Layout: Able to live on main level with bedroom/bathroom;Two level Home Equipment: Shower seat;Hand held Programmer, systems (2 wheels);Cane - single point;Rollator (4 wheels)      Prior Function Prior Level of Function : Independent/Modified Independent             Mobility Comments: using RW for mobility d/t L foot drop ADLs Comments: Reports able to toilet, bathe self - some assist for LB dressing. Family assist with IADLs. pt no longer drives     Extremity/Trunk Assessment   Upper Extremity Assessment Upper Extremity Assessment: Defer to OT evaluation (noting pain with movement and muscle activation; able to passively move L  shoulder into 90 degrees flexion (pt held onto LUE with R hand, likely for security))    Lower Extremity Assessment Lower Extremity Assessment: Generalized weakness;LLE deficits/detail LLE Deficits / Details: Noting decr coordination with LLE motion; decr active ankle dorsiflexion       Communication   Communication Communication: Impaired Factors Affecting Communication: Difficulty expressing self (previous CVA effected speech)    Cognition Arousal: Alert Behavior During Therapy: WFL for tasks assessed/performed   PT - Cognitive impairments: No apparent impairments                         Following commands: Intact       Cueing Cueing Techniques: Verbal cues, Tactile cues     General Comments      Exercises     Assessment/Plan    PT Assessment Patient needs continued PT services  PT Problem List Decreased strength;Decreased range of motion;Decreased activity tolerance;Decreased balance;Decreased mobility;Decreased coordination;Decreased knowledge of use of DME;Decreased safety awareness;Decreased knowledge of precautions;Pain       PT Treatment Interventions DME instruction;Gait training;Functional mobility training;Therapeutic activities;Therapeutic exercise;Balance training;Neuromuscular re-education;Patient/family education;Wheelchair  mobility training;Manual techniques    PT Goals (Current goals can be found in the Care Plan section)  Acute Rehab PT Goals Patient Stated Goal: Less pain in shoulder so he can do more PT Goal Formulation: With patient Time For Goal Achievement: 09/28/23 Potential to Achieve Goals: Good    Frequency Min 2X/week     Co-evaluation               AM-PAC PT 6 Clicks Mobility  Outcome Measure Help needed turning from your back to your side while in a flat bed without using bedrails?: A Lot Help needed moving from lying on your back to sitting on the side of a flat bed without using bedrails?: A Lot Help needed  moving to and from a bed to a chair (including a wheelchair)?: A Lot Help needed standing up from a chair using your arms (e.g., wheelchair or bedside chair)?: A Lot Help needed to walk in hospital room?: Total Help needed climbing 3-5 steps with a railing? : Total 6 Click Score: 10    End of Session Equipment Utilized During Treatment: Gait belt Activity Tolerance: Patient tolerated treatment well Patient left: in bed;with call bell/phone within reach Nurse Communication: Mobility status PT Visit Diagnosis: Unsteadiness on feet (R26.81);Other abnormalities of gait and mobility (R26.89);Pain;Muscle weakness (generalized) (M62.81);Hemiplegia and hemiparesis Hemiplegia - Right/Left: Left Hemiplegia - dominant/non-dominant: Non-dominant Hemiplegia - caused by: Cerebral infarction Pain - Right/Left: Left Pain - part of body: Shoulder    Time: 9096-9051 PT Time Calculation (min) (ACUTE ONLY): 45 min   Charges:   PT Evaluation $PT Eval Moderate Complexity: 1 Mod PT Treatments $Therapeutic Activity: 23-37 mins PT General Charges $$ ACUTE PT VISIT: 1 Visit         Silvano Currier, PT  Acute Rehabilitation Services Office [phone] Secure Chat welcomed   Silvano VEAR Currier 09/14/2023, 2:05 PM

## 2023-09-14 NOTE — ED Notes (Signed)
 Admitting provider at bedside.

## 2023-09-14 NOTE — Plan of Care (Addendum)
 Spoke with Dr. Loretha who is on call for oncology. Recommended getting fractionated bilirubin and leave current medication regimen including Alectinib the same for now unless pt clinically changes, Tbili only slightly elevated above baseline. Dr. Loretha plans to discuss pt with Dr. Sherrod tomorrow who regularly sees pt. Appreciate oncology assistance.   - LDH, haptoglobin, reticulocytes, peripheral smear, fractionated bilirubin pending - Will hold off on transfusion given hemoglobin improved to 8.4 - Transfusion threshold <8

## 2023-09-14 NOTE — H&P (Signed)
 Hospital Admission History and Physical Service Pager: [phone]  Patient name: Arthur [name] Medical record number: [id number] Date of Birth: 1940-10-16 Age: 83 y.o. Gender: male  Primary Care Provider: Stoney Blizzard, DO Consultants: None Code Status: Full code Preferred Emergency Contact:  Contact Information     Name Relation Home Work Mobile   Frontera,Vipul Son [phone]  [phone]      Other Contacts     Name Relation Home Work Mobile   Polakowski,[name] Daughter [phone]  [phone]   Cocking,Shital Daughter   [phone]        Chief Complaint: L arm pain  Assessment and Plan: Renald R Olivera is a 83 y.o. male presenting with left shoulder pain.    Differential for presentation of this includes: Rotator cuff injury: Most likely etiology MSK in origin given pain began after turning in bed and pain mostly with movement at glenohumeral joint. Lung cancer metastasis: Unlikely given acute onset without discernible changes on x-ray imaging Septic joint: Unlikely given no infectious etiology or symptoms nor erythema, swelling or warmth of the joint  Assessment & Plan Left shoulder pain Most likely MSK etiology as above, will obtain MRI left shoulder to elucidate further.  Manage pain symptomatically. - Admitted to FMTS service, Dr. Madelon attending - MRI left shoulder wo - Pain regimen: Tylenol  1g every 6 hour scheduled, oxycodone  2.5 mg every 4 hours as needed, lidocaine  patch - PT/OT consult Anemia Seen by oncology in 07/2023 for similar concern, anemia thought to be multifactorial from IDA, anemia of chronic disease and hemolytic anemia.  Has required prior transfusions with oncology last year to maintain transfusion threshold <8 given CAD.  No evidence of acute bleed, FOBT negative therefore low concern for GI bleed. 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  Given worsening swelling per family, will obtain DVT ultrasound to assess for acute on chronic process. - Left leg DVT ultrasound - Continue treatment dose Lovenox  Slurred speech Now resolved, per family likely in the setting of anxiety from ED visit.  No other neurologic deficits and CT head negative for acute process.  In discussion with family will defer MRI at this time and monitor clinically. Cough Significant postprandial cough lasting 30 to 45 minutes concerning for dysphagia and underlying aspiration risk. - SLP consult  Chronic and Stable Problems:  T2DM: A1c 4.9, not currently on therapy.  Will monitor with daily labs. HLD: Continue home rosuvastatin  5 mg daily GERD: Continue home pantoprazole  40 mg daily Anxiety: Continue BuSpar  5 mg daily CAD: Continue Plavix  75 mg daily Stage IV lung cancer: Continue alectinib 600 mg twice daily.  Consider discussion with oncology/palliative care regarding CODE STATUS    FEN/GI: Heart healthy diet VTE Prophylaxis: Treatment dose Lovenox   Disposition: Med tele  History of Present Illness:  Arthur [name] is a 83 y.o. male presenting with left shoulder pain.  Son and daughter-in-law present at bedside providing additional history.  Report worsening pain that occurred about 2 weeks ago with now limited range of motion of left arm.  Reports he was trying to pull over to the side of the bed and noticed he had pain after that time.  Family has been giving him Voltaren  gel and Tylenol  without relief of symptoms.  Reports he still has been ambulating via walker as recently as yesterday.  No other prior surgeries to  that area.  Denies neck and back pain.  Some intermittent pain in his left knee as well but this has been manageable.  Family reports he has had some slurred speech that began only today when EMS arrived.  Daughter-in-law states this is common for him when he  becomes anxious which she was feeling.  Reports he does have chronic left-sided foot drop from prior CVA, mostly wheelchair-bound but can use a walker with assistance.  No blood in stool, urine or vomiting. Has required 2-3 transfusions last year with Oncology.  Otherwise normal intake.  Family does report he has significant coughing events lasting 30 to 45 minutes when consuming food.  They are worried about his ability to swallow.  Family does report he may have worsening of his chronic bilateral lower extremity edema particularly on the left side.  Has been using treatment dose Lovenox  given history of DVT in left leg.  In the ED, left shoulder and humerus x-ray negative for acute fracture.  CT head negative for acute process, showed chronic occipital infarct.  Hemoglobin trended down to 7.5, FOBT subsequently negative.  Review Of Systems: Per HPI  Pertinent Past Medical History: T2DM HLD HTN Lung cancer CVA with left sided deficits, wheelchair bound H/o DVT/PE on Lovenox  Remainder reviewed in history tab.   Pertinent Past Surgical History: Cardiac stent - 2023 Knee surgery - 2005 Remainder reviewed in history tab.   Pertinent Social History: Tobacco use: None Alcohol use: None Other Substance use: None Lives with son and daughter in law  Pertinent Family History: Mother - CAD Father - CAD Remainder reviewed in history tab.   Important Outpatient Medications: Alectinib 600mg  BID Baclofen  10mg  BID prn (not taking daily) Buspirone  5mg  BID Plavix  75mg  daily Lovenox  70mg  q12h Pantoprazole  40mg  daily Prednisone  10mg  daily (for appetite) Crestor  5mg  daily Iron  PO Remainder reviewed in medication history.   Objective: BP (!) 119/46 (BP Location: Right Arm)   Pulse 90   Temp 97.9 F (36.6 C)   Resp 18   Ht 5' 11 (1.803 m)   Wt 70.3 kg   SpO2 99%   BMI 21.62 kg/m  Exam: General: Well-appearing, alert, NAD Eyes: PERRLA, anicteric sclera, corneal arcus  bilaterally ENTM: Moist mucus membranes. Neck: Supple, non-tender Cardiovascular: RRR without murmur Respiratory: CTAB. Normal WOB on RA Gastrointestinal: Soft, non-tender, non-distended MSK: L shoulder -No erythema, ecchymosis or deformity -TTP of posterior L glenohumoral region -Self abducts to 30 degree, can abduct to 120 degrees with assistance -Unable to tolerate Neer or Hawkins -27minus/5 strength in L shoulder abduction/flexion 2+ pitting edema bilaterally (R>L) Derm: Warm, dry, no rashes noted Neuro: CN intact. Motor and sensation intact globally Psych: Cooperative, pleasant   Labs:  CBC BMET  Recent Labs  Lab 09/14/23 0304  WBC 7.0  HGB 7.5*  HCT 22.9*  PLT 418*   Recent Labs  Lab 09/14/23 0304  NA 137  K 4.4  CL 102  CO2 27  BUN 16  CREATININE 1.27*  GLUCOSE 101*  CALCIUM  7.9*    Pertinent additional labs: FOBT: Negative  EKG: NSR   Imaging Studies Performed: CT Head Wo Contrast Result Date: 09/14/2023 IMPRESSION: Atrophy, chronic microvascular disease. No acute intracranial abnormality. Old left occipital infarct.  DG Shoulder Left Result Date: 09/14/2023 IMPRESSION: Left AC joint osteoarthritis. No acute bony abnormality.   DG Humerus Left Result Date: 09/14/2023 IMPRESSION: Left AC joint osteoarthritis. No acute bony abnormality.     Theophilus Pagan, MD 09/14/2023, 4:45 AM PGY-3, [place]  Family Medicine  FPTS Intern pager: [phone], text pages welcome Secure chat group [facility] Teaching Service

## 2023-09-14 NOTE — Assessment & Plan Note (Addendum)
 Most likely MSK etiology as above, will obtain MRI left shoulder to elucidate further.  Manage pain symptomatically. - Admitted to FMTS service, Dr. Madelon attending - MRI left shoulder wo - Pain regimen: Tylenol  1g every 6 hour scheduled, oxycodone  2.5 mg every 4 hours as needed, lidocaine  patch - PT/OT consult

## 2023-09-14 NOTE — Assessment & Plan Note (Signed)
 Chronic in left leg, current receiving treatment dose Lovenox .  Given worsening swelling per family, will obtain DVT ultrasound to assess for acute on chronic process. - Left leg DVT ultrasound - Continue treatment dose Lovenox 

## 2023-09-14 NOTE — Progress Notes (Signed)
 Orthopedic Tech Progress Note Patient Details:  Arthur [name] 15-Nov-1940 [id number]  Ortho Devices Type of Ortho Device: Arm sling Ortho Device/Splint Location: Handed to [place], OT. Ortho Device/Splint Interventions: Ordered   Post Interventions Instructions Provided: Care of device, Adjustment of device  [name] [name] 09/14/2023, 3:38 PM

## 2023-09-14 NOTE — Plan of Care (Signed)
 Spoke with pt's son on the phone with Dr. Lupie.  Son states that family had concern regarding slurred speech last night, more so when he was speaking in [place] which is unusual for him.  They thought it was related to his shoulder pain that he was having.  They state that he does not typically have slurred speech when he speaks [place].  Also report uncontrolled pain.  Son concerned that Tylenol  is not working and they have been unable to get stronger pain medication from the clinic.  Advised that patient has as needed oxycodone , will relay to patient's RN to give him a tablet.  Discussed blood transfusion with son, patient has had these in the past.  Advised that I will try to get in touch with patient's oncologist to discuss his care.  Son concern for leg swelling.  Obtaining DVT ultrasound  All questions answered

## 2023-09-14 NOTE — Progress Notes (Signed)
 Prior-To-Admission Oral Chemotherapy for Treatment of Oncologic Disease   Ordered by Dr. Izetta Nap, MD to continue prior-to-admission oral chemotherapy regimen of alectinib 600 mg BID with meals.  Procedure Per Pharmacy & Therapeutics Committee Policy: Orders for continuation of home oral chemotherapy for treatment of an oncologic disease will be held unless approved by an oncologist during current admission.    For patients receiving oncology care at [facility], inpatient pharmacist contacts patient's oncologist during regular office hours to review. If earlier review is medically necessary, attending physician consults [facility] on-call oncologist   For patients receiving oncology care outside of [facility], attending physician consults patient's oncologist to review. If this oncologist or their coverage cannot be reached, attending physician consults [facility] on-call oncologist   Oral chemotherapy continuation order is on hold pending oncologist review Pharmacy reached out and asked Dr. Loretha about resuming patient's home oral chemotherapy regimen and still awaiting response. After discussing with Dr. Elyce Prescott who spoke with Dr. Loretha with oncology and confirmed okay for patient to continue during inpatient stay. Pharmacy will continue with the verification process.   Thank you,   Feliciano Close, PharmD PGY2 Infectious Diseases Pharmacy Resident  09/14/2023 2:14 PM

## 2023-09-14 NOTE — Assessment & Plan Note (Signed)
 Now resolved, per family likely in the setting of anxiety from ED visit.  No other neurologic deficits and CT head negative for acute process.  In discussion with family will defer MRI at this time and monitor clinically.

## 2023-09-14 NOTE — Evaluation (Signed)
 Occupational Therapy Evaluation Patient Details Name: Arthur [name] MRN: [id number] DOB: 1940/04/27 Today's Date: 09/14/2023   History of Present Illness   Pt is an 83 y/o M presenting to ED with L shoulder pain x3 days and slurred speech. MRI with rotator cuff tears, tendinosis, and large subscapular hematoma in the setting of chronic anticoagulation. Ortho consulted and recommending conservating mgmt. PMH includes DM, high cholesterol, HTN, lung CA     Clinical Impressions Pt has been needing incr assist at baseline for ADLs, has primarily been in the bed and just getting OOB with RW to walk to bathroom/shower with assist from family. Pt currently limited by L shoulder pain, needing up to mod A for ADLs, mod A for bed mobility and min A +2 for transfers with RW. Pt provided with shoulder sling and handout for donning/doffing/positioning. Educated on use for comfort only and LUE precautions. Pt presenting with impairments listed below, will follow acutely. Recommend HHOT at d/c.      If plan is discharge home, recommend the following:   A little help with walking and/or transfers;A lot of help with bathing/dressing/bathroom;A lot of help with walking and/or transfers;Direct supervision/assist for medications management;Direct supervision/assist for financial management;Assist for transportation;Help with stairs or ramp for entrance     Functional Status Assessment   Patient has had a recent decline in their functional status and demonstrates the ability to make significant improvements in function in a reasonable and predictable amount of time.     Equipment Recommendations   None recommended by OT (pt has all needed DME)     Recommendations for Other Services   PT consult     Precautions/Restrictions   Precautions Precautions: Fall;Other (comment) Precaution/Restrictions Comments: LUE relative rest per ortho, sling for comfort Required Braces or Orthoses: Sling      Mobility Bed Mobility Overal bed mobility: Needs Assistance Bed Mobility: Rolling, Sidelying to Sit, Sit to Supine Rolling: Mod assist Sidelying to sit: Mod assist   Sit to supine: Mod assist        Transfers Overall transfer level: Needs assistance Equipment used: Rolling walker (2 wheels) Transfers: Sit to/from Stand, Bed to chair/wheelchair/BSC Sit to Stand: Min assist, +2 physical assistance                  Balance Overall balance assessment: Needs assistance   Sitting balance-Leahy Scale: Fair Sitting balance - Comments: static sitting EOB     Standing balance-Leahy Scale: Poor Standing balance comment: reliant on external support                           ADL either performed or assessed with clinical judgement   ADL Overall ADL's : Needs assistance/impaired Eating/Feeding: Set up;Sitting   Grooming: Set up;Sitting   Upper Body Bathing: Minimal assistance;Sitting   Lower Body Bathing: Sitting/lateral leans;Moderate assistance   Upper Body Dressing : Minimal assistance;Sitting   Lower Body Dressing: Moderate assistance   Toilet Transfer: Minimal assistance;+2 for physical assistance   Toileting- Clothing Manipulation and Hygiene: Contact guard assist       Functional mobility during ADLs: Minimal assistance;Rolling walker (2 wheels)       Vision Baseline Vision/History: 1 Wears glasses Vision Assessment?: No apparent visual deficits     Perception Perception: Not tested       Praxis Praxis: Not tested       Pertinent Vitals/Pain Pain Assessment Pain Assessment: Faces Pain Score: 6  Faces Pain Scale:  Hurts even more Pain Location: L shoulder Pain Descriptors / Indicators: Grimacing, Guarding Pain Intervention(s): Limited activity within patient's tolerance, Monitored during session, Repositioned     Extremity/Trunk Assessment Upper Extremity Assessment Upper Extremity Assessment: Generalized weakness;Right hand  dominant;LUE deficits/detail LUE Deficits / Details: shoudler AROM to 80*, can bring hand to mouth, painful, guarding LUE Sensation: WNL LUE Coordination: decreased gross motor   Lower Extremity Assessment Lower Extremity Assessment: Defer to PT evaluation LLE Deficits / Details: Noting decr coordination with LLE motion; decr active ankle dorsiflexion   Cervical / Trunk Assessment Cervical / Trunk Assessment: Normal   Communication Communication Communication: Impaired Factors Affecting Communication: Difficulty expressing self (prior CVA affected speech)   Cognition Arousal: Alert Behavior During Therapy: WFL for tasks assessed/performed Cognition: No apparent impairments                               Following commands: Intact       Cueing  General Comments   Cueing Techniques: Verbal cues;Tactile cues  VSS on RA   Exercises     Shoulder Instructions      Home Living Family/patient expects to be discharged to:: Private residence Living Arrangements: Children Available Help at Discharge: Family;Available 24 hours/day Type of Home: House Home Access: Stairs to enter [employer] of Steps: 1   Home Layout: Able to live on main level with bedroom/bathroom;Two level     Bathroom Shower/Tub: Producer, television/film/video: Standard     Home Equipment: Shower seat;Hand held Programmer, systems (2 wheels);Cane - single point;Rollator (4 wheels);Wheelchair - manual;Hospital bed          Prior Functioning/Environment Prior Level of Function : Independent/Modified Independent             Mobility Comments: using RW for mobility d/t L foot drop ADLs Comments: Reports able to toilet, bathe self - some assist for LB dressing. Family assist with IADLs. pt no longer drives    OT Problem List: Decreased strength;Decreased range of motion;Decreased activity tolerance;Impaired balance (sitting and/or standing);Decreased  cognition;Decreased safety awareness;Impaired UE functional use   OT Treatment/Interventions: Self-care/ADL training;Therapeutic exercise;Energy conservation;DME and/or AE instruction;Therapeutic activities;Patient/family education;Balance training      OT Goals(Current goals can be found in the care plan section)   Acute Rehab OT Goals Patient Stated Goal: none stated OT Goal Formulation: With patient Time For Goal Achievement: 09/28/23 Potential to Achieve Goals: Good ADL Goals Pt Will Perform Upper Body Dressing: with min assist;sitting Pt Will Perform Lower Body Dressing: with min assist;sitting/lateral leans;sit to/from stand Pt Will Transfer to Toilet: with min assist;ambulating;regular height toilet Pt Will Perform Tub/Shower Transfer: Tub transfer;Shower transfer;with min assist;ambulating;shower seat   OT Frequency:  Min 2X/week    Co-evaluation              AM-PAC OT 6 Clicks Daily Activity     Outcome Measure Help from another person eating meals?: A Little Help from another person taking care of personal grooming?: A Little Help from another person toileting, which includes using toliet, bedpan, or urinal?: A Lot Help from another person bathing (including washing, rinsing, drying)?: A Lot Help from another person to put on and taking off regular upper body clothing?: A Lot Help from another person to put on and taking off regular lower body clothing?: A Lot 6 Click Score: 14   End of Session Nurse Communication: Mobility status  Activity Tolerance: Patient tolerated  treatment well Patient left: in bed;with call bell/phone within reach;with bed alarm set;with family/visitor present  OT Visit Diagnosis: Unsteadiness on feet (R26.81);Other abnormalities of gait and mobility (R26.89);Muscle weakness (generalized) (M62.81)                Time: 8544-8473 OT Time Calculation (min): 31 min Charges:  OT General Charges $OT Visit: 1 Visit OT Evaluation $OT Eval  Moderate Complexity: 1 Mod OT Treatments $Therapeutic Activity: 8-22 mins  Suhani Stillion K, OTD, OTR/L SecureChat Preferred Acute Rehab [phone]   Laneta POUR Koonce 09/14/2023, 3:56 PM

## 2023-09-14 NOTE — Plan of Care (Signed)
 FMTS Interim Progress Note  S: Patient was seen and examined at bedside.  He is lying comfortably supine on the bed.  He is alert and oriented x 3 however when speaking to me in English has slightly slurred speech.  Discussed this with son over the phone who states this has been present since last night and is unusual for him.  When he speaks his native language of Hindi he does not slur his speech.  Patient reports persistent left shoulder pain and is requesting pain medication.  He denies any other complaints at this time.  O: BP (!) 114/59 (BP Location: Right Arm)   Pulse 94   Temp 97.7 F (36.5 C) (Oral)   Resp 17   Ht 5' 11 (1.803 m)   Wt 70.3 kg   SpO2 99%   BMI 21.62 kg/m   General: A&O, NAD, pleasant and smiling when speaking to me HEENT: No sign of trauma, EOM grossly intact Cardiac: RRR, no m/r/g Respiratory: CTAB, normal WOB, no w/c/r GI: Soft, NTTP, non-distended, bowel sounds present Extremities: NTTP, no peripheral edema, moderate posterior left shoulder tenderness to palpation Neuro: No sensation deficits, some slurred speech, equal smile, weak grip strength of left hand, weak left arm secondary to left shoulder pain, left foot drop, all other cranial nerves intact Psych: Appropriate mood and affect  A/P: Left shoulder pain Persistent even after Tylenol . - Advised patient that he does have as needed oxycodone  2.5 mg for pain and he may ask for it - Will adjust pain management as needed  Anemia ?Hemolytic anemia History of hemolytic anemia in the past requiring transfusions, concern during this admission. - FOBT negative on admission, no complaints of hematochezia - LDH, haptoglobin, peripheral smear, CBC with differential ordered - Repeat CBC significant for hemoglobin of 8.4, will hold off on PRBC transfusion at this time with goal < 8 - Consult oncology if daily home prednisone  and Plavix  should be continued at this time  Slurred speech Noticed on exam this  morning, appears intermittent and only when speaking [place]. -Spoke with patient's son over the phone regarding this, patient's son does confirm slurred speech on admission last night however he thought it was secondary to the amount of pain the patient was in.  He states that he does not hear any slurred speech when the patient is speaking in his native language of Hindi. 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 - Bedside swallow ordered - SLP consult ordered   Lupie Credit, DO 09/14/2023, 11:14 AM PGY-1, [facility] Family Medicine Service pager [phone]

## 2023-09-14 NOTE — Plan of Care (Deleted)
 Paged ortho on call and left message with office line regarding MRI L shoulder concerning for large subscapularis muscle hematoma, full thickness tear of subscapularis tendon, and partial thickness tears

## 2023-09-14 NOTE — Progress Notes (Signed)
 Physical Therapy Note  PT eval complete with full note to follow;  Recommend Getting back home with HHPT/OT follow up for transition out of hospital;  More details re: recommendations will depend on plan of care after we get MRI results; I'm curious for Ortho recs;  I anticipate he will need more assist at home than he has typically needed; Would like to know exactly how much assist is available to him at home;   Silvano Currier, PT  Acute Rehabilitation Services Pager [phone] Office [phone]

## 2023-09-14 NOTE — Plan of Care (Signed)
 Spoke to Ortho on-call Dr. Dozier. As far as the hematoma, there is no surgical intervention necessary at this time and is likely related to chronic Lovenox  use. We may use a sling for comfort but overall encourages movement and relative rest to avoid shoulder stiffness. He is happy to follow-up with the patient on an outpatient basis.

## 2023-09-14 NOTE — Progress Notes (Signed)
 VASCULAR LAB    Left lower extremity venous duplex has been performed.  See CV proc for preliminary results.   Fanta Wimberley, RVT 09/14/2023, 3:55 PM

## 2023-09-14 NOTE — ED Notes (Signed)
 Pt's home medication bottle containing Arthur [name]  was taken to pharmacy per admitting provider request.

## 2023-09-14 NOTE — Evaluation (Signed)
 Clinical/Bedside Swallow Evaluation Patient Details  Name: Arthur [name] MRN: [id number] Date of Birth: Nov 08, 1940  Today's Date: 09/14/2023 Time: SLP Start Time (ACUTE ONLY): 1345 SLP Stop Time (ACUTE ONLY): 1405 SLP Time Calculation (min) (ACUTE ONLY): 20 min  Past Medical History:  Past Medical History:  Diagnosis Date   Diabetes mellitus without complication (HCC)    High cholesterol    Hypertension    lung ca    Normal nuclear stress test 01/29/2008   stress perfusion study apparently in 2010 in [facility] which he said was negative.   Past Surgical History:  Past Surgical History:  Procedure Laterality Date   BIOPSY  10/17/2022   Procedure: BIOPSY;  Surgeon: Stacia Glendia BRAVO, MD;  Location: [facility] ENDOSCOPY;  Service: Gastroenterology;;   CATARACT EXTRACTION, BILATERAL  2006   CORONARY STENT INTERVENTION N/A 02/06/2021   Procedure: CORONARY STENT INTERVENTION;  Surgeon: Dann Candyce RAMAN, MD;  Location: [facility] INVASIVE CV LAB;  Service: Cardiovascular;  Laterality: N/A;   CORONARY ULTRASOUND/IVUS N/A 02/06/2021   Procedure: Intravascular Ultrasound/IVUS;  Surgeon: Dann Candyce RAMAN, MD;  Location: [facility] INVASIVE CV LAB;  Service: Cardiovascular;  Laterality: N/A;   ESOPHAGOGASTRODUODENOSCOPY N/A 10/17/2022   Procedure: ESOPHAGOGASTRODUODENOSCOPY (EGD);  Surgeon: Stacia Glendia BRAVO, MD;  Location: [facility] ENDOSCOPY;  Service: Gastroenterology;  Laterality: N/A;   FINE NEEDLE ASPIRATION  03/21/2021   Procedure: FINE NEEDLE ASPIRATION;  Surgeon: Brenna Adine CROME, DO;  Location: [facility] ENDOSCOPY;  Service: Pulmonary;;   IR RADIOLOGIST EVAL & MGMT  12/24/2021   KNEE SURGERY  2005   LEFT HEART CATH AND CORONARY ANGIOGRAPHY N/A 02/06/2021   Procedure: LEFT HEART CATH AND CORONARY ANGIOGRAPHY;  Surgeon: Dann Candyce RAMAN, MD;  Location: [facility] INVASIVE CV LAB;  Service: Cardiovascular;  Laterality: N/A;   VIDEO BRONCHOSCOPY WITH ENDOBRONCHIAL ULTRASOUND Bilateral 03/21/2021   Procedure:  VIDEO BRONCHOSCOPY WITH ENDOBRONCHIAL ULTRASOUND;  Surgeon: Brenna Adine CROME, DO;  Location: [facility] ENDOSCOPY;  Service: Pulmonary;  Laterality: Bilateral;   HPI:  Arthur [name] is a 83 y.o. male with PMH of Diabetes, hyperlipidemia, hypertension, GERD, PE, CVA, stage IV adenocarcinoma of the lung, DVT, paroxysmal A-fib and duodenal ulcer, presenting with left shoulder pain and slurred speech. CT of the head negative for acute process. MRI of the brain noted No acute intracranial abnormality, Remote nonhemorrhagic infarcts in the medial left occipital lobe and superior right occipital lobe, Remote lacunar infarcts in the inferior cerebellum bilaterally. Per MD, patient with significant post prandial coughing.    Assessment / Plan / Recommendation  Clinical Impression  Patient presents with a suspected primary esophageal dysphagia characterized by c/o post prandial coughing, mostly after evening meal, and with a seemingly normal oropharyngeal swallow. Son reports h/o chest radiation which could decrease esophageal motility as well as recent increase in anxiety which can increase already present GER. Educated patient and son on general esophageal precautions including remaining upright for 1 hour after meals. Will defer further management of GER medications to MD. If symptoms persist, esophageal evaluation would be recommended. SLP Visit Diagnosis: Dysphagia, unspecified (R13.10)    Aspiration Risk       Diet Recommendation Regular;Thin liquid    Liquid Administration via: Cup;Straw Medication Administration: Whole meds with liquid Supervision: Patient able to self feed Compensations: Slow rate;Small sips/bites Postural Changes: Seated upright at 90 degrees;Remain upright for at least 30 minutes after po intake    Other  Recommendations Recommended Consults: Consider esophageal assessment (if symptoms persist) Oral Care Recommendations: Oral care BID  Functional Status Assessment  Patient has had a recent decline in their functional status and/or demonstrates limited ability to make significant improvements in function in a reasonable and predictable amount of time  Frequency and Duration             Swallow Study   General HPI: Arthur [name] is a 83 y.o. male with PMH of Diabetes, hyperlipidemia, hypertension, GERD, PE, CVA, stage IV adenocarcinoma of the lung, DVT, paroxysmal A-fib and duodenal ulcer, presenting with left shoulder pain and slurred speech. CT of the head negative for acute process. MRI of the brain noted No acute intracranial abnormality, Remote nonhemorrhagic infarcts in the medial left occipital lobe and superior right occipital lobe, Remote lacunar infarcts in the inferior cerebellum bilaterally. Per MD, patient with significant post prandial coughing. Type of Study: Bedside Swallow Evaluation Previous Swallow Assessment: none Diet Prior to this Study: Regular;Thin liquids (Level 0) Temperature Spikes Noted: No Respiratory Status: Room air History of Recent Intubation: No Behavior/Cognition: Alert;Cooperative;Pleasant mood Oral Cavity Assessment: Within Functional Limits Oral Care Completed by SLP: No Oral Cavity - Dentition: Adequate natural dentition Vision: Functional for self-feeding Self-Feeding Abilities: Able to feed self Patient Positioning: Upright in bed Baseline Vocal Quality: Normal Volitional Cough: Strong Volitional Swallow: Able to elicit    Oral/Motor/Sensory Function Overall Oral Motor/Sensory Function: Within functional limits   Ice Chips Ice chips: Not tested   Thin Liquid Thin Liquid: Within functional limits Presentation: Cup;Self Fed;Straw    Nectar Thick Nectar Thick Liquid: Not tested   Honey Thick Honey Thick Liquid: Not tested   Puree Puree: Within functional limits Presentation: Self Fed   Solid     Solid: Within functional limits Presentation: Self Fed     [employer] MA, CCC-SLP  Seona Clemenson  Meryl 09/14/2023,2:24 PM

## 2023-09-14 NOTE — Hospital Course (Addendum)
 Arthur [name] is an 83yo M admitted for L shoulder pain x2 weeks. His hospital course is as outlined below:  Left shoulder pain Patient presented to the ED for primary complaint of L shoulder pain x2 weeks. MRI significant for intramuscular hematoma and multiple rotator cuff tears. Orthopedic surgery did not see indication for surgical intervention and recommended outpatient follow up. We placed a sling for comfort, and encouraged movement and relative rest to avoid stiffness. He received tylenol , oxycodone , lidocaine  patch, voltaren  gel, and heating pads for pain relief. Pain improved at time of discharge, with improvement of range of motion.***   Anemia Baseline hemoglobin 8-9, decreased to 7.5 on admission. Received 1U PRBC to maintain goal of 8. No sign of active bleeding, FOBT negative. LDH was elevated but at baseline, haptoglobin <10 but at baseline, reticulocytes normal, indirect bilirubin normal. 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 Hgb remained stable throughout the rest of his hospital stay.  Decreased Oral Intake Patient endorsing he has not eaten well in a week. He still has good appetite, and his daughter is bringing him food from home. We trailed ODT Zofran  before meals to help with nausea which helped.  Patient out to his oncologist to suggested pausing cancer treatment for a week (8/21-8/27), and starting Medrol  Dosepak to improve his appetite***.  Orthostatic Hypotension PT/OT have been having difficulty with BPs during sessions. This greatly improved after patient received 1U of blood. Patient has also had poor oral intake which can be contributing.   Slurred speech Questionable slurred speech noticed intermittently by family the day prior to admission and by the ED. MRI head to rule out CVA indicative of no acute infarcts.    Leg swelling Left lower extremity venous Doppler ordered this admission for leg  swelling, showed chronic deep vein thrombosis involving the left posterior tibial veins, and left peroneal veins. Patient was continued on his home treatment dose Lovenox .   Cough Postprandial cough on admission, SLP recommendations to increase GERD medication. Cough was resolved by day 2 of his admission.  Follow up with PCP recommendations Restart alectinib on 09/25/2023 Ensure ortho follow-up  Workup for orthostatic hypotension or BPPV

## 2023-09-14 NOTE — ED Triage Notes (Addendum)
 PT was brought in by [facility], PT called for EMS for shoulder pain for the last 3 days. PT family states he has slight slurred speech but it has been for 3-4 days per EMS. PT just complaining of left shoulder pain VSS and A&OX4 PT slipped and caught his self with the left arm he states and pain ever since

## 2023-09-14 NOTE — Assessment & Plan Note (Signed)
 Seen by oncology in 07/2023 for similar concern, anemia thought to be multifactorial from IDA, anemia of chronic disease and hemolytic anemia.  Has required prior transfusions with oncology last year to maintain transfusion threshold <8 given CAD.  No evidence of acute bleed, FOBT negative therefore low concern for GI bleed. -Repeat CBC, consider transfusion if Hgb persistently <8 -Continue home prednisone  10 mg daily

## 2023-09-14 NOTE — ED Provider Notes (Signed)
 [place] EMERGENCY DEPARTMENT AT [facility] Provider Note   CSN: [id number] Arrival date & time: 09/14/23  0204     Patient presents with: Shoulder Pain   Arthur [name] is a 83 y.o. male.   83 year old male brought in by EMS from home with complaint of left upper arm pain. Patient states he has a hospital bed, slipped  while getting up and injured his arm. States he did not fall, did not hit head. He denies any other acute complaints tonight. EMS reported family with concern for slurred speech for the past 3-4 days, family not currently at bedside, patient denies changes in speech, word finding difficulties.        Prior to Admission medications   Medication Sig Start Date End Date Taking? Authorizing Provider  alectinib (ALECENSA ) 150 MG capsule Take 4 capsules (600 mg total) by mouth 2 (two) times daily with a meal. 07/18/23  Yes Sherrod Sherrod, MD  baclofen  (LIORESAL ) 10 MG tablet Take 1 tablet (10 mg total) by mouth 2 (two) times daily as needed for muscle spasms. 01/31/23  Yes Chambliss, Layman CROME, MD  busPIRone  (BUSPAR ) 5 MG tablet Take 1 tablet (5 mg total) by mouth 2 (two) times daily. 09/09/23  Yes Rosendo Rush, MD  clopidogrel  (PLAVIX ) 75 MG tablet Take 1 tablet (75 mg total) by mouth daily with breakfast. 09/09/23  Yes Rosendo Rush, MD  diclofenac  Sodium (VOLTAREN ) 1 % GEL Apply 2 g topically as needed. 06/15/20  Yes [provider]  enoxaparin  (LOVENOX ) 80 MG/0.8ML injection INJECT 0.7 ML (70 MG) INTO THE SKIN EVERY 12 HOURS 09/02/23  Yes [name], [name], DO  Iron -FA-B Cmp-C-Biot-Probiotic (FUSION PLUS) CAPS Take 1 capsule by mouth once daily 08/06/23  Yes [name], [name], DO  pantoprazole  (PROTONIX ) 40 MG tablet Take 1 tablet (40 mg total) by mouth daily. 06/05/23  Yes [name], [name], DO  predniSONE  (DELTASONE ) 10 MG tablet Take 1 tablet (10 mg total) by mouth daily with breakfast. 08/19/23  Yes Heilingoetter, Cassandra L, PA-C  rosuvastatin   (CRESTOR ) 5 MG tablet Take 1 tablet by mouth once daily 05/19/23  Yes Gomes, Adriana, DO  sodium chloride  (OCEAN) 0.65 % SOLN nasal spray Place 1 spray into both nostrils as needed for congestion. 09/06/22  Yes Howell Lunger, DO    Allergies: Ace inhibitors and Iohexol     Review of Systems Negative except as per HPI Updated Vital Signs BP (!) 107/51 (BP Location: Right Arm)   Pulse (!) 113   Temp 98 F (36.7 C) (Oral)   Resp 16   Ht 5' 11 (1.803 m)   Wt 70.3 kg   SpO2 100%   BMI 21.62 kg/m   Physical Exam Vitals and nursing note reviewed. Exam conducted with a chaperone present.  Constitutional:      General: He is not in acute distress.    Appearance: He is well-developed. He is not diaphoretic.  HENT:     Head: Normocephalic and atraumatic.  Cardiovascular:     Pulses: Normal pulses.  Pulmonary:     Effort: Pulmonary effort is normal.  Abdominal:     Palpations: Abdomen is soft.     Tenderness: There is no abdominal tenderness.  Genitourinary:    Rectum: Guaiac result negative. No tenderness.     Comments: Stool soft and brown Musculoskeletal:        General: Tenderness present. No swelling or deformity.       Arms:     Right lower leg:  Edema present.     Left lower leg: Edema present.     Comments: Pitting edema bilateral lower extremities, patient states this is baseline for him  Skin:    General: Skin is warm and dry.     Findings: No erythema or rash.  Neurological:     Mental Status: He is alert and oriented to person, place, and time.     GCS: GCS eye subscore is 4. GCS verbal subscore is 5. GCS motor subscore is 6.     Cranial Nerves: Facial asymmetry present.     Comments: Left lower extremity weakness, reportedly baseline per patient Slight right sided facial weakness questionable. No sensory deficits identified. Left leg weakness reported be secondary to known foot drop.  Psychiatric:        Behavior: Behavior normal.     (all labs ordered are  listed, but only abnormal results are displayed) Labs Reviewed  CBC WITH DIFFERENTIAL/PLATELET - Abnormal; Notable for the following components:      Result Value   RBC 2.72 (*)    Hemoglobin 7.5 (*)    HCT 22.9 (*)    RDW 18.5 (*)    Platelets 418 (*)    All other components within normal limits  COMPREHENSIVE METABOLIC PANEL WITH GFR - Abnormal; Notable for the following components:   Glucose, Bld 101 (*)    Creatinine, Ser 1.27 (*)    Calcium  7.9 (*)    Total Protein 4.7 (*)    Albumin  1.7 (*)    Total Bilirubin 1.4 (*)    GFR, Estimated 56 (*)    All other components within normal limits  PROTIME-INR  URINALYSIS, ROUTINE W REFLEX MICROSCOPIC  POC OCCULT BLOOD, ED    EKG: None  Radiology: CT Head Wo Contrast Result Date: 09/14/2023 CLINICAL DATA:  Neuro deficit, acute, stroke suspected.  Fall. EXAM: CT HEAD WITHOUT CONTRAST TECHNIQUE: Contiguous axial images were obtained from the base of the skull through the vertex without intravenous contrast. RADIATION DOSE REDUCTION: This exam was performed according to the departmental dose-optimization program which includes automated exposure control, adjustment of the mA and/or kV according to patient size and/or use of iterative reconstruction technique. COMPARISON:  03/17/2021 FINDINGS: Brain: Old left occipital infarct with encephalomalacia. No acute intracranial abnormality. Specifically, no hemorrhage, hydrocephalus, mass lesion, acute infarction, or significant intracranial injury. There is atrophy and chronic small vessel disease changes. Vascular: No hyperdense vessel or unexpected calcification. Skull: No acute calvarial abnormality. Sinuses/Orbits: No acute findings Other: None IMPRESSION: Atrophy, chronic microvascular disease. No acute intracranial abnormality. Old left occipital infarct. Electronically Signed   By: Franky Crease M.D.   On: 09/14/2023 03:27   DG Shoulder Left Result Date: 09/14/2023 CLINICAL DATA:  Fall, left  arm pain EXAM: LEFT SHOULDER - 2+ VIEW; LEFT HUMERUS - 2+ VIEW COMPARISON:  None Available. FINDINGS: Degenerative changes in the [facility] joint with joint space narrowing and spurring. Glenohumeral joint is maintained. No acute bony abnormality. Specifically, no fracture, subluxation, or dislocation. Soft tissues are intact. IMPRESSION: Left AC joint osteoarthritis. No acute bony abnormality. Electronically Signed   By: Franky Crease M.D.   On: 09/14/2023 02:56   DG Humerus Left Result Date: 09/14/2023 CLINICAL DATA:  Fall, left arm pain EXAM: LEFT SHOULDER - 2+ VIEW; LEFT HUMERUS - 2+ VIEW COMPARISON:  None Available. FINDINGS: Degenerative changes in the [facility] joint with joint space narrowing and spurring. Glenohumeral joint is maintained. No acute bony abnormality. Specifically, no fracture, subluxation, or dislocation. Soft  tissues are intact. IMPRESSION: Left AC joint osteoarthritis. No acute bony abnormality. Electronically Signed   By: Franky Crease M.D.   On: 09/14/2023 02:56     Procedures   Medications Ordered in the ED - No data to display                                   Medical Decision Making Amount and/or Complexity of Data Reviewed Labs: ordered. Radiology: ordered.  Risk Decision regarding hospitalization.   This patient presents to the ED for concern of left arm pain, family with concern for slurred speech for 3 to 4 days, this involves an extensive number of treatment options, and is a complaint that carries with it a high risk of complications and morbidity.  The differential diagnosis includes but not limited to CVA, fracture, dislocation, contusion, anemia, UTI   Co morbidities / Chronic conditions that complicate the patient evaluation  Diabetes, hyperlipidemia, hypertension, GERD, PE, CVA, stage IV adenocarcinoma of the lung, DVT, paroxysmal A-fib and duodenal ulcer.  Currently on Plavix .   Additional history obtained:  Additional history obtained from EMR Family at  bedside who report concern for left arm pain, slurred sleep each for 3 to 4 days, lower extremity edema- initially reported as baseline, family then concerned with worsening leg swelling External records from outside source obtained and reviewed including prior labs on file   Lab Tests:  I Ordered, and personally interpreted labs.  The pertinent results include: CBC with hemoglobin 7.5, down from 9.2 4 days ago.  CMP without significant changes compared to prior on file, corrected calcium  is 9.1.  INR normal, Hemoccult negative.   Imaging Studies ordered:  I ordered imaging studies including x-ray left shoulder x-ray left humerus, CT head I independently visualized and interpreted imaging which showed no acute findings I agree with the radiologist interpretation; old left lacunar infarct   Cardiac Monitoring: / EKG:  The patient was maintained on a cardiac monitor.  I personally viewed and interpreted the cardiac monitored which showed an underlying rhythm of: a fib, rate 104   Problem List / ED Course / Critical interventions / Medication management  83 year old male brought in by EMS from home for severe left arm pain. Patient states he was pushing himself up in the bed, slipped but did not fall out of bed and has had pain in the left upper arm ever since. Pain is worse with palpation of the arm and any ROM of the upper arm, uses lower arm normally.  Radial pulses equal, sensation intact.  X-ray of the left arm is negative for acute bony injury.  Family notes that he has had slurred speech for the last 3 to 4 days.  He does appear to have a right sided facial weakness, difficult to discern if there is any speech change, patient denies any changes in his speech.  He has swelling to both of his lower extremities which he states is baseline and family initially reports his baseline however when discussing results with family, they note that the swelling to both of his legs is worse than usual.   He does have a left sided foot drop at baseline.  Hemoglobin is down to 7.5 compared to 9.2 just 4 days ago.  Hemoccult is negative.  CT head with concern for the slurred speech and facial weakness shows old infarct.  Request admission to family medicine service for  further workup of patient's worsening anemia and speech changes. I have reviewed the patients home medicines and have made adjustments as needed   Consultations Obtained:  I requested consultation with the family medicine service,  and discussed lab and imaging findings as well as pertinent plan - they recommend: Will consult for admission Case discussed with ER attending who agrees with plan of care.   Social Determinants of Health:  Lives at home, family nearby   Test / Admission - Considered:  Admit      Final diagnoses:  Anemia, unspecified type  Weakness    ED Discharge Orders     None          Beverley Leita DELENA DEVONNA 09/14/23 0541    Palumbo, April, MD 09/14/23 [phone]

## 2023-09-14 NOTE — Assessment & Plan Note (Signed)
 Significant postprandial cough lasting 30 to 45 minutes concerning for dysphagia and underlying aspiration risk. - SLP consult

## 2023-09-14 NOTE — Plan of Care (Signed)

## 2023-09-15 DIAGNOSIS — R64 Cachexia: Secondary | ICD-10-CM | POA: Diagnosis present

## 2023-09-15 DIAGNOSIS — E785 Hyperlipidemia, unspecified: Secondary | ICD-10-CM | POA: Diagnosis present

## 2023-09-15 DIAGNOSIS — I82442 Acute embolism and thrombosis of left tibial vein: Secondary | ICD-10-CM | POA: Diagnosis present

## 2023-09-15 DIAGNOSIS — M75122 Complete rotator cuff tear or rupture of left shoulder, not specified as traumatic: Secondary | ICD-10-CM | POA: Diagnosis present

## 2023-09-15 DIAGNOSIS — Z7401 Bed confinement status: Secondary | ICD-10-CM | POA: Diagnosis not present

## 2023-09-15 DIAGNOSIS — Z789 Other specified health status: Secondary | ICD-10-CM

## 2023-09-15 DIAGNOSIS — Z888 Allergy status to other drugs, medicaments and biological substances status: Secondary | ICD-10-CM | POA: Diagnosis not present

## 2023-09-15 DIAGNOSIS — M21372 Foot drop, left foot: Secondary | ICD-10-CM | POA: Diagnosis present

## 2023-09-15 DIAGNOSIS — Z7901 Long term (current) use of anticoagulants: Secondary | ICD-10-CM | POA: Diagnosis not present

## 2023-09-15 DIAGNOSIS — I2510 Atherosclerotic heart disease of native coronary artery without angina pectoris: Secondary | ICD-10-CM | POA: Diagnosis present

## 2023-09-15 DIAGNOSIS — Z993 Dependence on wheelchair: Secondary | ICD-10-CM | POA: Diagnosis not present

## 2023-09-15 DIAGNOSIS — D589 Hereditary hemolytic anemia, unspecified: Secondary | ICD-10-CM | POA: Diagnosis present

## 2023-09-15 DIAGNOSIS — R2681 Unsteadiness on feet: Secondary | ICD-10-CM | POA: Diagnosis not present

## 2023-09-15 DIAGNOSIS — I951 Orthostatic hypotension: Secondary | ICD-10-CM | POA: Diagnosis not present

## 2023-09-15 DIAGNOSIS — Z7902 Long term (current) use of antithrombotics/antiplatelets: Secondary | ICD-10-CM | POA: Diagnosis not present

## 2023-09-15 DIAGNOSIS — C3490 Malignant neoplasm of unspecified part of unspecified bronchus or lung: Secondary | ICD-10-CM | POA: Diagnosis present

## 2023-09-15 DIAGNOSIS — Z7952 Long term (current) use of systemic steroids: Secondary | ICD-10-CM | POA: Diagnosis not present

## 2023-09-15 DIAGNOSIS — M25512 Pain in left shoulder: Secondary | ICD-10-CM | POA: Diagnosis not present

## 2023-09-15 DIAGNOSIS — E440 Moderate protein-calorie malnutrition: Secondary | ICD-10-CM | POA: Diagnosis present

## 2023-09-15 DIAGNOSIS — Z955 Presence of coronary angioplasty implant and graft: Secondary | ICD-10-CM | POA: Diagnosis not present

## 2023-09-15 DIAGNOSIS — M75102 Unspecified rotator cuff tear or rupture of left shoulder, not specified as traumatic: Secondary | ICD-10-CM | POA: Diagnosis not present

## 2023-09-15 DIAGNOSIS — K219 Gastro-esophageal reflux disease without esophagitis: Secondary | ICD-10-CM | POA: Diagnosis present

## 2023-09-15 DIAGNOSIS — D638 Anemia in other chronic diseases classified elsewhere: Secondary | ICD-10-CM | POA: Diagnosis present

## 2023-09-15 DIAGNOSIS — E119 Type 2 diabetes mellitus without complications: Secondary | ICD-10-CM | POA: Diagnosis present

## 2023-09-15 DIAGNOSIS — Z79899 Other long term (current) drug therapy: Secondary | ICD-10-CM | POA: Diagnosis not present

## 2023-09-15 DIAGNOSIS — I82409 Acute embolism and thrombosis of unspecified deep veins of unspecified lower extremity: Secondary | ICD-10-CM | POA: Diagnosis not present

## 2023-09-15 DIAGNOSIS — Z91041 Radiographic dye allergy status: Secondary | ICD-10-CM | POA: Diagnosis not present

## 2023-09-15 DIAGNOSIS — Y92013 Bedroom of single-family (private) house as the place of occurrence of the external cause: Secondary | ICD-10-CM | POA: Diagnosis not present

## 2023-09-15 DIAGNOSIS — W06XXXA Fall from bed, initial encounter: Secondary | ICD-10-CM | POA: Diagnosis present

## 2023-09-15 DIAGNOSIS — R531 Weakness: Secondary | ICD-10-CM | POA: Diagnosis not present

## 2023-09-15 DIAGNOSIS — Z743 Need for continuous supervision: Secondary | ICD-10-CM | POA: Diagnosis not present

## 2023-09-15 DIAGNOSIS — I480 Paroxysmal atrial fibrillation: Secondary | ICD-10-CM | POA: Diagnosis present

## 2023-09-15 DIAGNOSIS — M6281 Muscle weakness (generalized): Secondary | ICD-10-CM | POA: Diagnosis not present

## 2023-09-15 DIAGNOSIS — D649 Anemia, unspecified: Secondary | ICD-10-CM | POA: Diagnosis not present

## 2023-09-15 DIAGNOSIS — I10 Essential (primary) hypertension: Secondary | ICD-10-CM | POA: Diagnosis present

## 2023-09-15 DIAGNOSIS — R638 Other symptoms and signs concerning food and fluid intake: Secondary | ICD-10-CM | POA: Diagnosis not present

## 2023-09-15 DIAGNOSIS — I82452 Acute embolism and thrombosis of left peroneal vein: Secondary | ICD-10-CM | POA: Diagnosis present

## 2023-09-15 LAB — CBC WITH DIFFERENTIAL/PLATELET
Abs Immature Granulocytes: 0.06 K/uL (ref 0.00–0.07)
Basophils Absolute: 0 K/uL (ref 0.0–0.1)
Basophils Relative: 1 %
Eosinophils Absolute: 0.1 K/uL (ref 0.0–0.5)
Eosinophils Relative: 2 %
HCT: 23.2 % — ABNORMAL LOW (ref 39.0–52.0)
Hemoglobin: 7.6 g/dL — ABNORMAL LOW (ref 13.0–17.0)
Immature Granulocytes: 1 %
Lymphocytes Relative: 24 %
Lymphs Abs: 1.5 K/uL (ref 0.7–4.0)
MCH: 27.7 pg (ref 26.0–34.0)
MCHC: 32.8 g/dL (ref 30.0–36.0)
MCV: 84.7 fL (ref 80.0–100.0)
Monocytes Absolute: 0.6 K/uL (ref 0.1–1.0)
Monocytes Relative: 10 %
Neutro Abs: 3.8 K/uL (ref 1.7–7.7)
Neutrophils Relative %: 62 %
Platelets: 354 K/uL (ref 150–400)
RBC: 2.74 MIL/uL — ABNORMAL LOW (ref 4.22–5.81)
RDW: 18.3 % — ABNORMAL HIGH (ref 11.5–15.5)
WBC: 6.1 K/uL (ref 4.0–10.5)
nRBC: 0.3 % — ABNORMAL HIGH (ref 0.0–0.2)

## 2023-09-15 LAB — BASIC METABOLIC PANEL WITH GFR
Anion gap: 7 (ref 5–15)
BUN: 16 mg/dL (ref 8–23)
CO2: 26 mmol/L (ref 22–32)
Calcium: 7.7 mg/dL — ABNORMAL LOW (ref 8.9–10.3)
Chloride: 105 mmol/L (ref 98–111)
Creatinine, Ser: 1.34 mg/dL — ABNORMAL HIGH (ref 0.61–1.24)
GFR, Estimated: 53 mL/min — ABNORMAL LOW (ref >60)
Glucose, Bld: 87 mg/dL (ref 70–99)
Potassium: 4.5 mmol/L (ref 3.5–5.1)
Sodium: 138 mmol/L (ref 135–145)

## 2023-09-15 LAB — PREPARE RBC (CROSSMATCH)

## 2023-09-15 MED ORDER — LACTATED RINGERS IV BOLUS
500.0000 mL | Freq: Once | INTRAVENOUS | Status: AC
  Administered 2023-09-15: 500 mL via INTRAVENOUS

## 2023-09-15 MED ORDER — SODIUM CHLORIDE 0.9% IV SOLUTION: Freq: Once | INTRAVENOUS | Status: AC

## 2023-09-15 NOTE — Assessment & Plan Note (Addendum)
 Stable. Manage pain symptomatically, ortho says there is nothing to manage surgically - Admitted to FMTS service, Dr. Rumball attending - Pain regimen: Tylenol  1g every 6 hour scheduled, oxycodone  2.5 mg every 4 hours as needed, lidocaine  patch - PT/OT recommending HHOT/PT

## 2023-09-15 NOTE — Plan of Care (Signed)
 I went to update patient and family at bedside with Dr. Howell.  Per hematology/oncology recommendations, patient will receive 1 unit of blood.  PT/OT says patient is not stable enough for home health, and be comfortable with SNF or CIR placement.  Unsure if patient will qualify for CIR, however son is agreeable to inpatient rehabilitation plan.  PT/OT was concern for some orthostatic hypotension on exam, will see if this improves following blood transfusion.

## 2023-09-15 NOTE — Care Management Obs Status (Signed)
 MEDICARE OBSERVATION STATUS NOTIFICATION   Patient Details  Name: Arthur [name] MRN: [id number] Date of Birth: 02/20/1940   Medicare Observation Status Notification Given:  Yes  Spoke with daughter Colvin Blatt at [phone] to make aware of the obs status and copy will be provided in the room  Claretta Deed 09/15/2023, 9:14 AM

## 2023-09-15 NOTE — Assessment & Plan Note (Addendum)
 Chronic in left LE - Continue treatment dose Lovenox 

## 2023-09-15 NOTE — Progress Notes (Signed)
 Physical Therapy Treatment Patient Details Name: Arthur [name] MRN: [id number] DOB: [birth year] Today's Date: 09/15/2023   History of Present Illness Pt is an 83 y/o M presenting to ED with L shoulder pain x3 days and slurred speech. MRI with rotator cuff tears, tendinosis, and large subscapular hematoma in the setting of chronic anticoagulation. Ortho consulted and recommending conservating mgmt. PMH includes DM, high cholesterol, HTN, lung CA    PT Comments  Continuing work on functional mobility and activity tolerance;  Session focused on functional transfers, with a focus on evaluating if his pain is better managed with addition of small dose of oxy to premedicate; Arthur [name] was very tired and asking about staying in bed upon PT arrival; Daughter in the room and helped with encouraging him to try moving; noting less grimace and less physical assist today with moving from supine to sit; placed and fit sling while he was sitting EOB; Tended for his eyes to drift closed, and he reported lightheadedness;   Noteworthy BP drop in sitting, coupled with lightheadedness; 75/65; Brief stand and simulated basic stand pivot transfer with 2 person assist at EOB to reposition to lay back down; seated EOB after brief standing trial, BP 105/98, HR 101; lightheadedness still present; laid back down and bed placed in semi-chair position;   Possible pt is orthostatic from bedrest prior to this admission, low Hgb, decr oral intake, and perhaps pain meds (although very low dose of oxy); Encouraged more upright sitting and more fluids;   At this time, pt is not safe to get go home from a functional mobility standpoint; He needs to be able to tolerate more upright postures consistently with stable BPs, and consistently be able to participate in transfers; Noting low Hgb today, N/V, dizziness while sitting EOB; considering the previosu CVA and the setting of Ca -- possible he will qualify as inpatient status?   If plan  is discharge home, recommend the following: A lot of help with walking and/or transfers;Two people to help with walking and/or transfers;A lot of help with bathing/dressing/bathroom   Can travel by private vehicle        Equipment Recommendations  Other (comment) (worth considering drop-arm BSC)    Recommendations for Other Services OT consult     Precautions / Restrictions Precautions Precautions: Fall;Other (comment) Precaution/Restrictions Comments: LUE relative rest per ortho, sling for comfort Required Braces or Orthoses: Sling Restrictions Weight Bearing Restrictions Per Provider Order: No Other Position/Activity Restrictions: LUE relative rest; sling for comfort     Mobility  Bed Mobility Overal bed mobility: Needs Assistance Bed Mobility: Supine to Sit, Sit to Supine     Supine to sit: Min assist Sit to supine: Max assist   General bed mobility comments: Min assist to elevate trunk to sit, HOB at 40 degrees; Cues to complete task by scooting to EOB and squaring off hips at EOB; Max assist to help LEs back into bed    Transfers Overall transfer level: Needs assistance Equipment used: 2 person hand held assist Transfers: Sit to/from Stand, Bed to chair/wheelchair/BSC Sit to Stand: Mod assist   Step pivot transfers: Mod assist       General transfer comment: Heavy mod assist to power up to stand; supported LUE/shoulder girdle at elbow and heav use of gait belt; LUE in sling; a few sidesteps toward [name], fatigued quickly;  heavier assist to control descent to sit    Ambulation/Gait  Stairs             Wheelchair Mobility     Tilt Bed    Modified Rankin (Stroke Patients Only)       Balance     Sitting balance-Leahy Scale: Fair       Standing balance-Leahy Scale: Poor                              Communication Communication Communication: Impaired Factors Affecting Communication: Difficulty  expressing self (prior CVA affected speech)  Cognition Arousal: Alert Behavior During Therapy: WFL for tasks assessed/performed   PT - Cognitive impairments: Initiation, Attention                       PT - Cognition Comments: Slow to answer questions; slow to initiate movement compared to yesterday Following commands: Intact      Cueing Cueing Techniques: Verbal cues, Tactile cues  Exercises      General Comments General comments (skin integrity, edema, etc.): Noteworthy BP drop in sitting, coupled with lightheadedness; 75/65; Brief stand at EOB to reposition to lay back down; seated after standing, BP 105/98      Pertinent Vitals/Pain Pain Assessment Pain Assessment: 0-10 Pain Score: 7  Pain Location: L shoulder Pain Descriptors / Indicators: Grimacing, Guarding Pain Intervention(s): Monitored during session, Premedicated before session    Home Living                          Prior Function            PT Goals (current goals can now be found in the care plan section) Acute Rehab PT Goals Patient Stated Goal: Less pain in shoulder so he can do more PT Goal Formulation: With patient Time For Goal Achievement: 09/28/23 Potential to Achieve Goals: Good Progress towards PT goals: Progressing toward goals (slowly, limtied by nausea today)    Frequency    Min 2X/week      PT Plan      Co-evaluation              AM-PAC PT 6 Clicks Mobility   Outcome Measure  Help needed turning from your back to your side while in a flat bed without using bedrails?: A Lot Help needed moving from lying on your back to sitting on the side of a flat bed without using bedrails?: A Lot Help needed moving to and from a bed to a chair (including a wheelchair)?: A Lot Help needed standing up from a chair using your arms (e.g., wheelchair or bedside chair)?: A Lot Help needed to walk in hospital room?: Total Help needed climbing 3-5 steps with a railing? :  Total 6 Click Score: 10    End of Session Equipment Utilized During Treatment: Gait belt;Other (comment) (sling) Activity Tolerance: Other (comment) (limited by nausea, and lightheadedness in sitting) Patient left: in bed;with call bell/phone within reach;with bed alarm set;with family/visitor present;Other (comment) (bed in semi-chair position) Nurse Communication: Mobility status (BP drop) PT Visit Diagnosis: Unsteadiness on feet (R26.81);Other abnormalities of gait and mobility (R26.89);Pain;Muscle weakness (generalized) (M62.81);Hemiplegia and hemiparesis Hemiplegia - Right/Left: Left Hemiplegia - dominant/non-dominant: Non-dominant Hemiplegia - caused by: Cerebral infarction Pain - Right/Left: Left Pain - part of body: Shoulder     Time: 0940-1020 PT Time Calculation (min) (ACUTE ONLY): 40 min  Charges:    $Therapeutic Activity: 38-52 mins PT General Charges $$  ACUTE PT VISIT: 1 Visit                     Silvano Currier, PT  Acute Rehabilitation Services Office [phone] Secure Chat welcomed    Silvano VEAR Currier 09/15/2023, 10:50 AM

## 2023-09-15 NOTE — Assessment & Plan Note (Addendum)
 thought to be multifactorial from IDA, anemia of chronic disease and hemolytic anemia.  transfusion threshold <8 given CAD.  No evidence of acute bleed, FOBT negative therefore low concern for GI bleed. - f/u hematology suggestions - Repeat CBC, consider transfusion if Hgb persistently <8 - Continue home prednisone  10 mg daily - f/u haptoglobin

## 2023-09-15 NOTE — Assessment & Plan Note (Addendum)
 Significant postprandial cough lasting 30 to 45 minutes concerning for dysphagia and underlying aspiration risk. - SLP - consider esophageal assessment if sx persist, oral care BID

## 2023-09-15 NOTE — Plan of Care (Signed)

## 2023-09-15 NOTE — Assessment & Plan Note (Deleted)
 Dr. Loretha following & discussing with oncology team - f/u fractionated bili

## 2023-09-15 NOTE — Assessment & Plan Note (Signed)
 T2DM: A1c 4.9, not currently on therapy.  Will monitor with daily labs. HLD: Continue home rosuvastatin  5 mg daily GERD: Continue home pantoprazole  40 mg daily Anxiety: Continue BuSpar  5 mg daily CAD: Continue Plavix  75 mg daily Stage IV lung cancer: Continue alectinib 600 mg twice daily.

## 2023-09-15 NOTE — TOC Initial Note (Addendum)
 Transition of Care [facility]) - Initial/Assessment Note    Patient Details  Name: Arthur [name] MRN: [id number] Date of Birth: [birth year]  [facility]) CM/SW Contact:    Robynn Eileen Hoose, RN Phone Number: 09/15/2023, 9:13 AM  Clinical Narrative:                 Secure message from provider regarding pt needing home health services and family requesting assistance with bathing patient every other day.  Spoke with daughter at bedside, explained home health services and discussed self pay for personal care aide.  Daughter feels home health service coming out a couple times a week for short period of time will be sufficient enough. Message to Angie with [facility], awaiting response.  1029: Message received from Angie with [facility], unable to accept patient at this time.   [facility] unable to accept patient due to out of network with patient's insurance coverage.  1155: Message received from PT that patient is not at full functioning mobility capacity. Patient blood pressure reported to be low and patient was symptomatic during therapy session. ICM will follow for disposition.   1347: Spoke with daughter regarding no HH agency accepting at this time. Daughter concerned with taking patient home with his BP dropping and his weakness. Daughter agreeable to acute inpatient rehab. She does not want SNF rehab.   Expected Discharge Plan: Home w Home Health Services Barriers to Discharge: Continued Medical Work up   Patient Goals and CMS Choice Patient states their goals for this hospitalization and ongoing recovery are:: Family wants pt to go home with home health services          Expected Discharge Plan and Services       Living arrangements for the past 2 months: Single Family Home                                      Prior Living Arrangements/Services Living arrangements for the past 2 months: Single Family Home Lives with:: Self,  Spouse Patient language and need for interpreter reviewed:: Yes Do you feel safe going back to the place where you live?: Yes      Need for Family Participation in Patient Care: Yes (Comment) Care giver support system in place?: Yes (comment) Current home services: DME Criminal Activity/Legal Involvement Pertinent to Current Situation/Hospitalization: No - Comment as needed  Activities of Daily Living   ADL Screening (condition at time of admission) Independently performs ADLs?: No Does the patient have a NEW difficulty with bathing/dressing/toileting/self-feeding that is expected to last >3 days?: Yes (Initiates electronic notice to provider for possible OT consult) Does the patient have a NEW difficulty with getting in/out of bed, walking, or climbing stairs that is expected to last >3 days?: Yes (Initiates electronic notice to provider for possible PT consult) Does the patient have a NEW difficulty with communication that is expected to last >3 days?: No Is the patient deaf or have difficulty hearing?: No Does the patient have difficulty seeing, even when wearing glasses/contacts?: No Does the patient have difficulty concentrating, remembering, or making decisions?: No  Permission Sought/Granted Permission sought to share information with : Case Manager, Magazine features editor                Emotional Assessment         Alcohol / Substance Use: Not Applicable Psych Involvement: No (comment)  Admission diagnosis:  Weakness [R53.1] Left shoulder pain [M25.512] Anemia, unspecified type [D64.9] Patient Active Problem List   Diagnosis Date Noted   Chronic health problem 09/15/2023   Left shoulder pain 09/14/2023   Slurred speech 09/14/2023   Duodenal ulcer 10/20/2022   Chronic anticoagulation 10/19/2022   Paroxysmal atrial fibrillation (HCC) 10/18/2022   Renal insufficiency 07/04/2021   Foot drop, left foot 07/04/2021   Non-small cell carcinoma of lung, stage 4  (HCC) 06/15/2021   Encounter for antineoplastic chemotherapy 06/13/2021   Deep venous embolism and thrombosis (HCC) 05/16/2021   Inguinal hernia 05/16/2021   Primary adenocarcinoma of lung (HCC) 04/09/2021   Cerebral embolism with cerebral infarction 03/18/2021   Status post coronary artery stent placement    Coronary artery disease due to lipid rich plaque    Pulmonary embolus (HCC) 02/03/2021   Weight loss 03/08/2020   Anemia 09/06/2015   GERD (gastroesophageal reflux disease) 07/05/2013   Weakness 01/20/2012   Arthritis 02/26/2010   Gout 11/06/2009   Diabetes mellitus, type II (HCC) 09/22/2009   Hyperlipidemia 09/22/2009   Essential hypertension, benign 09/22/2009   PCP:  Stoney Blizzard, DO Pharmacy:   [facility] [address] [place] - [address] [place] [address] Phone: [phone] Fax: [phone]  [facility] [address] [place] [address] Phone: [phone] Fax: [phone]  [facility] [address] [place] [address] Phone: [phone] Fax: [phone]  [facility] [address], [place] - [address] [place] [address] Phone: [phone] Fax: [phone]     Social Drivers of Health (SDOH) Social History: SDOH Screenings   Food Insecurity: No Food Insecurity (09/14/2023)  Housing: Unknown (09/14/2023)  Transportation Needs: No Transportation Needs (09/14/2023)  Utilities: Not At Risk (09/14/2023)  Alcohol Screen: Low Risk  (10/28/2022)  Depression (PHQ2-9): Low Risk  (06/25/2023)  Financial Resource Strain: Low Risk  (10/28/2022)  Physical Activity: Insufficiently Active (10/28/2022)  Social Connections: Moderately Integrated (09/14/2023)  Stress: No Stress Concern Present (10/28/2022)  Tobacco Use: Low Risk  (09/14/2023)  Health Literacy: Adequate Health Literacy (10/28/2022)    SDOH Interventions:     Readmission Risk Interventions    06/19/2021    4:57 PM  Readmission Risk Prevention Plan  Transportation Screening Complete  PCP or Specialist Appt within 3-5 Days Complete  HRI or Home Care Consult Complete  Social Work Consult for Recovery Care Planning/Counseling Complete  Medication Review Oceanographer) Complete

## 2023-09-15 NOTE — Progress Notes (Signed)
 Daily Progress Note Intern Pager: [phone]  Patient name: Arthur [name] Medical record number: [id number] Date of birth: 1940/12/22 Age: 83 y.o. Gender: male  Primary Care Provider: Stoney Blizzard, DO Consultants: ortho, oncology, hematology Code Status: FULL  Pt Overview and Major Events to Date:  8/17: admitted  Medical Decision Making: Arthur [name] is a 83 y.o. male admitted for left shoulder pain. Pertinent PMH/PSH includes chronic DVT, multifactorial anemia, and primary adenocarcinoma of the lung.  Assessment & Plan Anemia thought to be multifactorial from IDA, anemia of chronic disease and hemolytic anemia.  transfusion threshold <8 given CAD.  No evidence of acute bleed, FOBT negative therefore low concern for GI bleed. - f/u hematology suggestions - Repeat CBC, consider transfusion if Hgb persistently <8 - Continue home prednisone  10 mg daily - f/u haptoglobin Deep venous embolism and thrombosis (HCC) Chronic in left LE - Continue treatment dose Lovenox  Left shoulder pain Stable. Manage pain symptomatically, ortho says there is nothing to manage surgically - Admitted to FMTS service, Dr. Rumball attending - Pain regimen: Tylenol  1g every 6 hour scheduled, oxycodone  2.5 mg every 4 hours as needed, lidocaine  patch - PT/OT recommending HHOT/PT Cough (Resolved: 09/15/2023) Significant postprandial cough lasting 30 to 45 minutes concerning for dysphagia and underlying aspiration risk. - SLP - consider esophageal assessment if sx persist, oral care BID Chronic health problem T2DM: A1c 4.9, not currently on therapy.  Will monitor with daily labs. HLD: Continue home rosuvastatin  5 mg daily GERD: Continue home pantoprazole  40 mg daily Anxiety: Continue BuSpar  5 mg daily CAD: Continue Plavix  75 mg daily Stage IV lung cancer: Continue alectinib 600 mg twice daily.  FEN/GI: heart healthy diet PPx: Tx dose Lovenox , IVC filter Dispo:Home with home health pending  bed placement & insurance coverage  Subjective:  Patient is doing well today and has no complaints. He is denying shoulder pain and cough. Family at bedside only concern is needing assistance with bathing pt every other day upon discharge.   Objective: Temp:  [97.4 F (36.3 C)-98.7 F (37.1 C)] 98 F (36.7 C) (08/17 2351) Pulse Rate:  [86-105] 95 (08/17 2351) Resp:  [16-18] 17 (08/17 2351) BP: (105-119)/(52-78) 119/78 (08/17 2351) SpO2:  [97 %-100 %] 98 % (08/17 2351) Physical Exam: General: resting in bed, NAD Cardiovascular: RRR, no r/m/g Respiratory: coarse breath sounds, normal work on breathing on RA Abdomen: soft, nontender, non distended, bowel sounds present Extremities: non edematous, NTTP, non erythematous and warm  Laboratory: Most recent CBC Lab Results  Component Value Date   WBC 6.1 09/15/2023   HGB 7.6 (L) 09/15/2023   HCT 23.2 (L) 09/15/2023   MCV 84.7 09/15/2023   PLT 354 09/15/2023   Most recent BMP    Latest Ref Rng & Units 09/15/2023    4:18 AM  BMP  Glucose 70 - 99 mg/dL 87   BUN 8 - 23 mg/dL 16   Creatinine 9.38 - 1.24 mg/dL 8.65   Sodium 864 - 854 mmol/L 138   Potassium 3.5 - 5.1 mmol/L 4.5   Chloride 98 - 111 mmol/L 105   CO2 22 - 32 mmol/L 26   Calcium  8.9 - 10.3 mg/dL 7.7     Other pertinent labs: LDH 260   MRI Shoulder: Radiologist Impression:  1. Large complex intramuscular fluid collection in the subscapularis muscle measuring 5.1 x 3 x 9 cm concerning for a large intramuscular hematoma. 2. Severe supraspinatus tendinosis with a high-grade partial-thickness bursal surface tear along the  anterior insertion measuring 9 mm AP, 7.5 mm medial-lateral and a high-grade partial-thickness articular surface tear just proximal to the insertion measuring 6 mm medial-lateral and 6 mm AP. 3. Moderate-severe infraspinatus tendinosis. 4. Large full-thickness tear of the subscapularis tendon at the musculotendinous junction. 5. Mild tendinosis  of the intra-articular portion of the long head of the biceps tendon. 6. Moderate osteoarthritis of the glenohumeral joint.  Idelle Nakai, DO 09/15/2023, 7:21 AM  PGY-1, [facility] FPTS Intern pager: [phone], text pages welcome Secure chat group [facility] Teaching Service

## 2023-09-16 DIAGNOSIS — R110 Nausea: Secondary | ICD-10-CM

## 2023-09-16 LAB — TYPE AND SCREEN
ABO/RH(D): B NEG
Antibody Screen: NEGATIVE
Unit division: 0

## 2023-09-16 LAB — BPAM RBC
Blood Product Expiration Date: 202508252359
ISSUE DATE / TIME: 202508182043
Unit Type and Rh: 1700

## 2023-09-16 LAB — CBC
HCT: 26.8 % — ABNORMAL LOW (ref 39.0–52.0)
Hemoglobin: 9.3 g/dL — ABNORMAL LOW (ref 13.0–17.0)
MCH: 28.2 pg (ref 26.0–34.0)
MCHC: 34.7 g/dL (ref 30.0–36.0)
MCV: 81.2 fL (ref 80.0–100.0)
Platelets: 362 K/uL (ref 150–400)
RBC: 3.3 MIL/uL — ABNORMAL LOW (ref 4.22–5.81)
RDW: 18.5 % — ABNORMAL HIGH (ref 11.5–15.5)
WBC: 6.5 K/uL (ref 4.0–10.5)
nRBC: 0 % (ref 0.0–0.2)

## 2023-09-16 LAB — HAPTOGLOBIN: Haptoglobin: <10 mg/dL — ABNORMAL LOW (ref 38–329)

## 2023-09-16 MED ORDER — ONDANSETRON 4 MG PO TBDP
4.0000 mg | ORAL_TABLET | Freq: Four times a day (QID) | ORAL | Status: DC | PRN
  Administered 2023-09-16 – 2023-09-17 (×2): 4 mg via ORAL
  Filled 2023-09-16 (×3): qty 1

## 2023-09-16 MED ORDER — DICLOFENAC SODIUM 1 % EX GEL
2.0000 g | Freq: Four times a day (QID) | CUTANEOUS | Status: DC | PRN
  Administered 2023-09-16: 2 g via TOPICAL
  Filled 2023-09-16: qty 100

## 2023-09-16 MED ORDER — OXYCODONE HCL 5 MG PO TABS
2.5000 mg | ORAL_TABLET | ORAL | Status: DC | PRN
  Administered 2023-09-16 – 2023-09-17 (×4): 5 mg via ORAL
  Filled 2023-09-16 (×4): qty 1

## 2023-09-16 NOTE — Assessment & Plan Note (Addendum)
 Stable. Manage pain symptomatically, ortho says there is nothing to manage surgically - Admitted to FMTS service, Dr. Rumball attending - Pain regimen: Tylenol  1g every 6 hour scheduled, oxycodone  2.5-5 mg every 4 hours as needed, lidocaine  patch - Obtain shoulder immobilizer and use heat pack as needed - Diclofenac  gel 1% 2 g 4 times daily as needed for shoulder pain - PT/OT recommending CIR but was denied. PT/OT will reevaluate pt for placement in SNF or home with home health

## 2023-09-16 NOTE — Progress Notes (Addendum)
 Physical Therapy Note  In discussion with Medical Team yesterday afternoon;   Have updated DC recs to post-acute rehab at CIR to maximize independence and safety with mobility and ADLs in prep for getting back home with family assist;     09/16/23 0600   PT - Assessment/Plan  Follow Up Recommendations Acute inpatient rehab (3hours/day)   Notified CIR Admissions Coordinator for consideration of Mr. Mazer case;   Silvano Currier, PT  Acute Rehabilitation Services Office [phone] Secure Chat welcomed    09/16/2023,10:53am Addendum: Noting he does not qualify for CIR; we can consider short-term SNF for post-acute rehab - though I anticipate family may decline SNF; Will continue to follow and work towards safe DC plan (aiming at getting home)

## 2023-09-16 NOTE — Assessment & Plan Note (Addendum)
 Patient complaining of nausea today.  Denies any emesis -Zofran  4 mg ODT every 6h as needed - discuss bringing food from home for patient

## 2023-09-16 NOTE — Assessment & Plan Note (Signed)
 T2DM: A1c 4.9, not currently on therapy.  Will monitor with daily labs. HLD: Continue home rosuvastatin  5 mg daily GERD: Continue home pantoprazole  40 mg daily Anxiety: Continue BuSpar  5 mg daily CAD: Continue Plavix  75 mg daily Stage IV lung cancer: Continue alectinib 600 mg twice daily.

## 2023-09-16 NOTE — Progress Notes (Signed)
 ? ?  Inpatient Rehab Admissions Coordinator : ? ?Per therapy recommendations patient was screened for CIR candidacy by Ottie Glazier RN MSN. Patient does not appear to demonstrate the medical neccesity for a Hospital Rehabilitation /CIR admit. I will not place a Rehab Consult. Recommend other Rehab Venues to be pursued. Please contact me with any questions. ? ?Ottie Glazier RN MSN ?Admissions Coordinator ?[phone]  ?

## 2023-09-16 NOTE — Progress Notes (Signed)
 Occupational Therapy Treatment Patient Details Name: Arthur [name] MRN: [id number] DOB: 1940/12/16 Today's Date: 09/16/2023   History of present illness Pt is an 83 y/o M presenting to ED with L shoulder pain x3 days and slurred speech. MRI with rotator cuff tears, tendinosis, and large subscapular hematoma in the setting of chronic anticoagulation. Ortho consulted and recommending conservating mgmt. PMH includes DM, high cholesterol, HTN, lung CA   OT comments  Pt making progress toward goals, needed mod A for bed mobility due to limited use of LUE, and min A +2 for x2 standing attempts from EOB. Pt needed mod A to don sling in sitting, able to complete seated BLE therex/counter pressure techniques to help incr BP and reduce symptoms (see BP measures below). Pt dry heaving after 2 stands and requests to be returned to bed. Pt presenting with impairments listed below, will follow acutely. Patient will benefit from continued inpatient follow up therapy, <3 hours/day to maximize safety/ind with ADL/functional mobility.   BP supine 119/62 (78) BP seated 127/78 (92) BP standing 112/95 (102)      If plan is discharge home, recommend the following:  A little help with walking and/or transfers;A lot of help with bathing/dressing/bathroom;A lot of help with walking and/or transfers;Direct supervision/assist for medications management;Direct supervision/assist for financial management;Assist for transportation;Help with stairs or ramp for entrance   Equipment Recommendations  Other (comment) (defer)    Recommendations for Other Services PT consult    Precautions / Restrictions Precautions Precautions: Fall;Other (comment) Precaution/Restrictions Comments: LUE relative rest per ortho, sling for comfort Required Braces or Orthoses: Sling Restrictions Weight Bearing Restrictions Per Provider Order: No       Mobility Bed Mobility Overal bed mobility: Needs Assistance Bed Mobility: Supine  to Sit     Supine to sit: Mod assist          Transfers Overall transfer level: Needs assistance Equipment used: 1 person hand held assist, Rolling walker (2 wheels) Transfers: Sit to/from Stand, Bed to chair/wheelchair/BSC Sit to Stand: Min assist, +2 physical assistance           General transfer comment: pt becoming dizzy upon standing, dry heaving when returned to sitting EOB     Balance Overall balance assessment: Needs assistance Sitting-balance support: Single extremity supported Sitting balance-Leahy Scale: Fair Sitting balance - Comments: static sitting EOB     Standing balance-Leahy Scale: Poor Standing balance comment: reliant on external support                           ADL either performed or assessed with clinical judgement   ADL Overall ADL's : Needs assistance/impaired                 Upper Body Dressing : Moderate assistance;Sitting Upper Body Dressing Details (indicate cue type and reason): donning LUE sling     Toilet Transfer: Minimal assistance;+2 for physical assistance           Functional mobility during ADLs: Minimal assistance;+2 for physical assistance      Extremity/Trunk Assessment Upper Extremity Assessment Upper Extremity Assessment: Generalized weakness LUE Deficits / Details: shoudler AROM to 80*, can bring hand to mouth, painful, guarding LUE Sensation: WNL LUE Coordination: decreased gross motor   Lower Extremity Assessment Lower Extremity Assessment: Defer to PT evaluation        Vision   Vision Assessment?: No apparent visual deficits   Perception Perception Perception: Not tested   Praxis  Praxis Praxis: Not tested   Communication Communication Communication: Impaired Factors Affecting Communication: Difficulty expressing self (prior CVA affected speech)   Cognition Arousal: Alert Behavior During Therapy: WFL for tasks assessed/performed Cognition: No apparent impairments                                Following commands: Intact        Cueing   Cueing Techniques: Verbal cues, Tactile cues  Exercises      Shoulder Instructions       General Comments see note for BP measures    Pertinent Vitals/ Pain       Pain Assessment Pain Assessment: Faces Pain Score: 7  Faces Pain Scale: Hurts even more Pain Location: L shoulder Pain Descriptors / Indicators: Grimacing, Guarding Pain Intervention(s): Limited activity within patient's tolerance, Monitored during session, Repositioned  Home Living                                          Prior Functioning/Environment              Frequency  Min 2X/week        Progress Toward Goals  OT Goals(current goals can now be found in the care plan section)  Progress towards OT goals: Progressing toward goals  Acute Rehab OT Goals Patient Stated Goal: none stated OT Goal Formulation: With patient Time For Goal Achievement: 09/28/23 Potential to Achieve Goals: Good ADL Goals Pt Will Perform Upper Body Dressing: with min assist;sitting Pt Will Perform Lower Body Dressing: with min assist;sitting/lateral leans;sit to/from stand Pt Will Transfer to Toilet: with min assist;ambulating;regular height toilet Pt Will Perform Tub/Shower Transfer: Tub transfer;Shower transfer;with min assist;ambulating;shower seat  Plan      Co-evaluation                 AM-PAC OT 6 Clicks Daily Activity     Outcome Measure   Help from another person eating meals?: A Little Help from another person taking care of personal grooming?: A Little Help from another person toileting, which includes using toliet, bedpan, or urinal?: A Lot Help from another person bathing (including washing, rinsing, drying)?: A Lot Help from another person to put on and taking off regular upper body clothing?: A Lot Help from another person to put on and taking off regular lower body clothing?: A Lot 6 Click Score:  14    End of Session Equipment Utilized During Treatment: Gait belt;Other (comment) (LUE Sling)  OT Visit Diagnosis: Unsteadiness on feet (R26.81);Other abnormalities of gait and mobility (R26.89);Muscle weakness (generalized) (M62.81)   Activity Tolerance Patient tolerated treatment well   Patient Left in bed;with call bell/phone within reach;with bed alarm set;with family/visitor present   Nurse Communication Mobility status (pt wants pain/nausea meds, wants heat pack on shoulder)        Time: 8474-8392 OT Time Calculation (min): 42 min  Charges: OT General Charges $OT Visit: 1 Visit OT Treatments $Therapeutic Activity: 38-52 mins  Arthur [name], OTD, OTR/L SecureChat Preferred Acute Rehab [phone]   Arthur [name] 09/16/2023, 4:33 PM

## 2023-09-16 NOTE — Progress Notes (Signed)
 Physical Therapy Treatment Patient Details Name: Arthur [name] MRN: [id number] DOB: 09-Sep-1940 Today's Date: 09/16/2023   History of Present Illness Pt is an 83 y/o M presenting to ED with L shoulder pain x3 days and slurred speech. MRI with rotator cuff tears, tendinosis, and large subscapular hematoma in the setting of chronic anticoagulation. Ortho consulted and recommending conservating mgmt. PMH includes DM, high cholesterol, HTN, lung CA    PT Comments  Continuing work on functional mobility and activity tolerance;  Session focused on functional transfers and obtaining orthostatic BPs; Arthur [name] was able to come to sitting EOB, transfer sit<>stand x2 with mod assist from lower surface, and min assist from higher surface; It seems at least today, that the shoulder sling gets in the way and is not helpful with managing pain; Pt reported dizziness/ nausea (including dry-heaving) throughout session, with sitting and standing activity; noteworthy that he did have a BP drop of 20 mmHg between supine and standing at 3 minutes; I discussed drinking more, eating more (including something to go with the pain meds) with pt and son; when I mention it to Arthur [name], he waves it off;  Still, BPs today were more stable compared to yesterday's PT session;   Despite dizziness and nausea, I'm pleased with his standing, and am hopeful that we can start gait next session;   We must consider - is it possible that even the low dose of oxy he is getting is contributing to his decr activity tolerance, nausea and dizziness? Does Arthur [name] have any other pain meds with different mechanism of action available to him?  In an Outpt setting, a shoulder steroid injection would be considered -- is that something we could reconsult Ortho to consider doing here? I'm hopeful that he'll get back to moving better and with less need for family assist when his shoulder pain is more under control.  Not a candidate for CIR, or LTACH, and  family is hesitant to consider SNF for post-acute rehab; Anticipate dc home with family assist, and would like to maximize [facility] services (but it sounds like there are barriers to getting HHPT)   If plan is discharge home, recommend the following: A lot of help with walking and/or transfers;Two people to help with walking and/or transfers;A lot of help with bathing/dressing/bathroom   Can travel by private vehicle     No  Equipment Recommendations  Other (comment) (worth considering drop-arm BSC)    Recommendations for Other Services OT consult     Precautions / Restrictions Precautions Precautions: Fall;Other (comment) Precaution/Restrictions Comments: LUE relative rest per ortho, sling for comfort Required Braces or Orthoses: Sling Restrictions Weight Bearing Restrictions Per Provider Order: No Other Position/Activity Restrictions: LUE relative rest; sling for comfort     Mobility  Bed Mobility Overal bed mobility: Needs Assistance Bed Mobility: Supine to Sit     Supine to sit: Mod assist     General bed mobility comments: Mod assist today to elevate trunk and square off hips at EOB; opted to give more assist to keep from phsyically taxing pt prior to standing; neauseated with dry heaving sitting up    Transfers Overall transfer level: Needs assistance Equipment used: Ambulation equipment used, 1 person hand held assist Transfers: Sit to/from Stand, Bed to chair/wheelchair/BSC Sit to Stand: Mod assist, Min assist           General transfer comment: Mod assist to stand to stedy from EOB; good pull up with RUE; min assist to pull  from higher stedy seat to stand; reporting dizziness in standing and high sit on stedy Transfer via Lift Equipment: Stedy  Ambulation/Gait               General Gait Details: Hopeful for gait next session   Stairs             Wheelchair Mobility     Tilt Bed    Modified Rankin (Stroke Patients Only)       Balance      Sitting balance-Leahy Scale: Fair Sitting balance - Comments: static sitting EOB     Standing balance-Leahy Scale: Poor Standing balance comment: reliant on external support                            Communication Communication Communication: Impaired Factors Affecting Communication: Difficulty expressing self (prior CVA affected speech)  Cognition Arousal: Alert Behavior During Therapy: WFL for tasks assessed/performed                             Following commands: Intact      Cueing Cueing Techniques: Verbal cues, Tactile cues  Exercises      General Comments General comments (skin integrity, edema, etc.): See also other PT note of this date for serial BPs      Pertinent Vitals/Pain Pain Assessment Pain Assessment: Faces Faces Pain Scale: Hurts little more Pain Location: L shoulder Pain Descriptors / Indicators: Grimacing, Guarding Pain Intervention(s): Monitored during session, Heat applied (heat at end of session)    Home Living                          Prior Function            PT Goals (current goals can now be found in the care plan section) Acute Rehab PT Goals Patient Stated Goal: Less pain in shoulder so he can do more PT Goal Formulation: With patient Time For Goal Achievement: 09/28/23 Potential to Achieve Goals: Good Progress towards PT goals: Progressing toward goals (Dizziness persists, but BPs improved compared to yesterday)    Frequency    Min 2X/week      PT Plan      Co-evaluation              AM-PAC PT 6 Clicks Mobility   Outcome Measure  Help needed turning from your back to your side while in a flat bed without using bedrails?: A Little Help needed moving from lying on your back to sitting on the side of a flat bed without using bedrails?: A Lot Help needed moving to and from a bed to a chair (including a wheelchair)?: A Lot Help needed standing up from a chair using your arms  (e.g., wheelchair or bedside chair)?: A Lot Help needed to walk in hospital room?: A Lot Help needed climbing 3-5 steps with a railing? : Total 6 Click Score: 12    End of Session Equipment Utilized During Treatment: Gait belt Activity Tolerance: Other (comment) (limited by nausea, and lightheadedness in sitting) Patient left: in chair;with call bell/phone within reach;with family/visitor present Nurse Communication: Mobility status (BP drop) PT Visit Diagnosis: Unsteadiness on feet (R26.81);Other abnormalities of gait and mobility (R26.89);Pain;Muscle weakness (generalized) (M62.81);Hemiplegia and hemiparesis Hemiplegia - Right/Left: Left Hemiplegia - dominant/non-dominant: Non-dominant Hemiplegia - caused by: Cerebral infarction Pain - Right/Left: Left Pain - part of body: Shoulder  Time: 1250-1320 PT Time Calculation (min) (ACUTE ONLY): 30 min  Charges:    $Therapeutic Activity: 23-37 mins PT General Charges $$ ACUTE PT VISIT: 1 Visit                     Silvano Currier, PT  Acute Rehabilitation Services Office [phone] Secure Chat welcomed    Silvano VEAR Currier 09/16/2023, 2:42 PM

## 2023-09-16 NOTE — Assessment & Plan Note (Signed)
 Chronic in left LE - Continue treatment dose Lovenox 

## 2023-09-16 NOTE — Plan of Care (Signed)

## 2023-09-16 NOTE — TOC Progression Note (Signed)
 Transition of Care [facility]) - Progression Note    Patient Details  Name: Arthur [name] MRN: [id number] Date of Birth: 12/28/1940  Transition of [facility]) CM/SW Contact  Lauraine FORBES Saa, LCSWA Phone Number: 09/16/2023, 12:14 PM  Clinical Narrative:     12:14 PM Per [facility] CIR admissions, patient is not a candidate for CIR as they do not demonstrate the medical necessity needed for IP Rehab. CSW informed medical team that if patient does not want to discharge to SNF, then they will have to discharge home with [facility]. TOC will continue to follow and be available to assist.  Expected Discharge Plan: Home w Home Health Services Barriers to Discharge: Continued Medical Work up               Expected Discharge Plan and Services       Living arrangements for the past 2 months: Single Family Home                                       Social Drivers of Health (SDOH) Interventions SDOH Screenings   Food Insecurity: No Food Insecurity (09/14/2023)  Housing: Low Risk  (09/15/2023)  Transportation Needs: No Transportation Needs (09/14/2023)  Utilities: Not At Risk (09/14/2023)  Alcohol Screen: Low Risk  (10/28/2022)  Depression (PHQ2-9): Low Risk  (06/25/2023)  Financial Resource Strain: Low Risk  (10/28/2022)  Physical Activity: Insufficiently Active (10/28/2022)  Social Connections: Unknown (09/15/2023)  Stress: No Stress Concern Present (10/28/2022)  Tobacco Use: Low Risk  (09/14/2023)  Health Literacy: Adequate Health Literacy (10/28/2022)    Readmission Risk Interventions    06/19/2021    4:57 PM  Readmission Risk Prevention Plan  Transportation Screening Complete  PCP or Specialist Appt within 3-5 Days Complete  HRI or Home Care Consult Complete  Social Work Consult for Recovery Care Planning/Counseling Complete  Medication Review Oceanographer) Complete

## 2023-09-16 NOTE — Progress Notes (Signed)
 Physical Therapy Treatment Note  PT session complete with full note to follow;   Orthostatic vitals as follows:    09/16/23 1227 09/16/23 1230 09/16/23 1245  Vital Signs  Patient Position (if appropriate) Orthostatic Vitals  --   --   Orthostatic Lying   BP- Lying 120/57  --   --   Pulse- Lying 83 (MAP 76)  --   --   Orthostatic Sitting  BP- Sitting 115/72 105/79 133/72  Pulse- Sitting 97 (MAP 72) Dizziness 107 (MAP89) Dizziness 99 (MAP 90; reclined, feet up)  Orthostatic Standing at 0 minutes  BP- Standing at 0 minutes  --  108/84  --   Pulse- Standing at 0 minutes  --  101 (MAP 93) Dizziness  --   Orthostatic Standing at 3 minutes  BP- Standing at 3 minutes  --  100/60  --   Pulse- Standing at 3 minutes  --  111 (MAP 74) Dizziness  --    Noteworthy for SBP decrease of 20 mmHg between supine and standing 3 minutes;  Still, BP response to standing is improving compared to yesterday's PT session;   Silvano Currier, PT  Acute Rehabilitation Services Office [phone] Secure Chat welcomed

## 2023-09-16 NOTE — TOC Progression Note (Signed)
 Transition of Care [facility]) - Progression Note    Patient Details  Name: Arthur [name] MRN: [id number] Date of Birth: [birth year]  Transition of Care [facility]) CM/SW Contact  Tom-Johnson, Kate Larock Daphne, RN Phone Number: 09/16/2023, 1:00 PM  Clinical Narrative:     CM consulted fro [facility] eligibility. Per [facility], patient does not have revenue codes for [facility] to approve LTACH stay (3 midnights in ICU or stepdown/progressive level of care). Patient is not on IV medications such as abx or pain medication. Patient do not meet criteria for LTACH based on [facility] guidelines at this time. MD and team notified via secure chat.   CM will continue to follow as patient progresses with care towards discharge.           Expected Discharge Plan: Home w Home Health Services Barriers to Discharge: Continued Medical Work up               Expected Discharge Plan and Services       Living arrangements for the past 2 months: Single Family Home                                       Social Drivers of Health (SDOH) Interventions SDOH Screenings   Food Insecurity: No Food Insecurity (09/14/2023)  Housing: Low Risk  (09/15/2023)  Transportation Needs: No Transportation Needs (09/14/2023)  Utilities: Not At Risk (09/14/2023)  Alcohol Screen: Low Risk  (10/28/2022)  Depression (PHQ2-9): Low Risk  (06/25/2023)  Financial Resource Strain: Low Risk  (10/28/2022)  Physical Activity: Insufficiently Active (10/28/2022)  Social Connections: Unknown (09/15/2023)  Stress: No Stress Concern Present (10/28/2022)  Tobacco Use: Low Risk  (09/14/2023)  Health Literacy: Adequate Health Literacy (10/28/2022)    Readmission Risk Interventions    06/19/2021    4:57 PM  Readmission Risk Prevention Plan  Transportation Screening Complete  PCP or Specialist Appt within 3-5 Days Complete  HRI or Home Care Consult Complete  Social Work Consult for Recovery Care Planning/Counseling Complete   Medication Review Oceanographer) Complete

## 2023-09-16 NOTE — Assessment & Plan Note (Addendum)
 Now s/p 1U transfusion, Hgb 9.2.  Patient complaining of dizziness when working with PT/OT - consider orthostatic vitals if no improvement with dizziness during PT/OT session today. - Continue home prednisone  10 mg daily - f/u haptoglobin - f/u BMP Cr trend

## 2023-09-16 NOTE — Progress Notes (Signed)
 Daily Progress Note Intern Pager: [phone]  Patient name: Arthur [name] Medical record number: [id number] Date of birth: 02-Aug-1940 Age: 83 y.o. Gender: male  Primary Care Provider: Stoney Blizzard, DO Consultants:  ortho, oncology, hematology  Code Status: FULL  Pt Overview and Major Events to Date:  8/17: admitted  Medical Decision Making: Arthur [name] is a 83 y.o. male admitted for left shoulder pain. Pertinent PMH/PSH includes chronic DVT, multifactorial anemia, and primary adenocarcinoma of the lung.  Assessment & Plan Left shoulder pain Hematoma Nontraumatic complete tear of left rotator cuff Stable. Manage pain symptomatically, ortho says there is nothing to manage surgically - Admitted to FMTS service, Dr. Rumball attending - Pain regimen: Tylenol  1g every 6 hour scheduled, oxycodone  2.5-5 mg every 4 hours as needed, lidocaine  patch - Obtain shoulder immobilizer and use heat pack as needed - Diclofenac  gel 1% 2 g 4 times daily as needed for shoulder pain - PT/OT recommending CIR but was denied. PT/OT will reevaluate pt for placement in SNF or home with home health Anemia Now s/p 1U transfusion, Hgb 9.2.  Patient complaining of dizziness when working with PT/OT - consider orthostatic vitals if no improvement with dizziness during PT/OT session today. - Continue home prednisone  10 mg daily - f/u haptoglobin - f/u BMP Cr trend Nausea Patient complaining of nausea today.  Denies any emesis -Zofran  4 mg ODT every 6h as needed - discuss bringing food from home for patient  Deep venous embolism and thrombosis (HCC) Chronic in left LE - Continue treatment dose Lovenox  Chronic health problem T2DM: A1c 4.9, not currently on therapy.  Will monitor with daily labs. HLD: Continue home rosuvastatin  5 mg daily GERD: Continue home pantoprazole  40 mg daily Anxiety: Continue BuSpar  5 mg daily CAD: Continue Plavix  75 mg daily Stage IV lung cancer: Continue alectinib  600 mg twice daily.     FEN/GI:  heart healthy diet  PPx: Tx dose Lovenox , IVC filter Dispo:CIR pending placement.   Subjective:  Patient is resting in bed comfortably.  He says he cannot stand with shoulder pain anymore.  He says he would like something tighter to wrap his arms and.  He is frustrated that the pain medication that he is getting is not helping with the pain, and is only making him sleepy.  Objective: Temp:  [97.4 F (36.3 C)-98.9 F (37.2 C)] 97.8 F (36.6 C) (08/19 0718) Pulse Rate:  [79-101] 96 (08/19 0718) Resp:  [15-18] 16 (08/19 0444) BP: (105-117)/(54-71) 108/60 (08/19 0718) SpO2:  [99 %-100 %] 100 % (08/19 0718) Physical Exam: General: Resting comfortably in bed, daughter at bedside Cardiovascular: RRR Respiratory: CTAB, no r/m/g Abdomen: Soft, nontender, nonguarded Extremities: nonedematous, LE symmetric  Laboratory: Most recent CBC Lab Results  Component Value Date   WBC 6.5 09/16/2023   HGB 9.3 (L) 09/16/2023   HCT 26.8 (L) 09/16/2023   MCV 81.2 09/16/2023   PLT 362 09/16/2023   Most recent BMP    Latest Ref Rng & Units 09/15/2023    4:18 AM  BMP  Glucose 70 - 99 mg/dL 87   BUN 8 - 23 mg/dL 16   Creatinine 9.38 - 1.24 mg/dL 8.65   Sodium 864 - 854 mmol/L 138   Potassium 3.5 - 5.1 mmol/L 4.5   Chloride 98 - 111 mmol/L 105   CO2 22 - 32 mmol/L 26   Calcium  8.9 - 10.3 mg/dL 7.7     Terrisha Lopata, DO 09/16/2023, 7:19 AM  PGY-1,  [facility] FPTS Intern pager: [phone], text pages welcome Secure chat group [facility] Teaching Service

## 2023-09-17 DIAGNOSIS — I951 Orthostatic hypotension: Secondary | ICD-10-CM | POA: Diagnosis not present

## 2023-09-17 DIAGNOSIS — R638 Other symptoms and signs concerning food and fluid intake: Secondary | ICD-10-CM | POA: Insufficient documentation

## 2023-09-17 DIAGNOSIS — C3490 Malignant neoplasm of unspecified part of unspecified bronchus or lung: Secondary | ICD-10-CM | POA: Diagnosis not present

## 2023-09-17 DIAGNOSIS — M75122 Complete rotator cuff tear or rupture of left shoulder, not specified as traumatic: Secondary | ICD-10-CM | POA: Diagnosis not present

## 2023-09-17 DIAGNOSIS — M25512 Pain in left shoulder: Secondary | ICD-10-CM | POA: Diagnosis not present

## 2023-09-17 LAB — BASIC METABOLIC PANEL WITH GFR
Anion gap: 8 (ref 5–15)
BUN: 17 mg/dL (ref 8–23)
CO2: 25 mmol/L (ref 22–32)
Calcium: 7.7 mg/dL — ABNORMAL LOW (ref 8.9–10.3)
Chloride: 104 mmol/L (ref 98–111)
Creatinine, Ser: 1.23 mg/dL (ref 0.61–1.24)
GFR, Estimated: 58 mL/min — ABNORMAL LOW (ref >60)
Glucose, Bld: 111 mg/dL — ABNORMAL HIGH (ref 70–99)
Potassium: 4.5 mmol/L (ref 3.5–5.1)
Sodium: 137 mmol/L (ref 135–145)

## 2023-09-17 LAB — CBC
HCT: 29.9 % — ABNORMAL LOW (ref 39.0–52.0)
Hemoglobin: 10.2 g/dL — ABNORMAL LOW (ref 13.0–17.0)
MCH: 28 pg (ref 26.0–34.0)
MCHC: 34.1 g/dL (ref 30.0–36.0)
MCV: 82.1 fL (ref 80.0–100.0)
Platelets: 357 K/uL (ref 150–400)
RBC: 3.64 MIL/uL — ABNORMAL LOW (ref 4.22–5.81)
RDW: 19 % — ABNORMAL HIGH (ref 11.5–15.5)
WBC: 6.6 K/uL (ref 4.0–10.5)
nRBC: 0 % (ref 0.0–0.2)

## 2023-09-17 MED ORDER — METHYLPREDNISOLONE 4 MG PO TBPK
8.0000 mg | ORAL_TABLET | Freq: Every morning | ORAL | Status: AC
  Administered 2023-09-17: 8 mg via ORAL
  Filled 2023-09-17 (×2): qty 21

## 2023-09-17 MED ORDER — METHYLPREDNISOLONE 4 MG PO TBPK
4.0000 mg | ORAL_TABLET | ORAL | Status: AC
  Administered 2023-09-17: 4 mg via ORAL

## 2023-09-17 MED ORDER — METHYLPREDNISOLONE 4 MG PO TBPK
8.0000 mg | ORAL_TABLET | Freq: Every evening | ORAL | Status: AC
  Administered 2023-09-18: 8 mg via ORAL

## 2023-09-17 MED ORDER — METHYLPREDNISOLONE 4 MG PO TBPK
4.0000 mg | ORAL_TABLET | Freq: Four times a day (QID) | ORAL | Status: AC
  Administered 2023-09-19 – 2023-09-22 (×10): 4 mg via ORAL

## 2023-09-17 MED ORDER — METHYLPREDNISOLONE 4 MG PO TBPK
4.0000 mg | ORAL_TABLET | Freq: Three times a day (TID) | ORAL | Status: AC
  Administered 2023-09-18 (×3): 4 mg via ORAL

## 2023-09-17 MED ORDER — METHYLPREDNISOLONE 4 MG PO TBPK
8.0000 mg | ORAL_TABLET | Freq: Every evening | ORAL | Status: AC
  Administered 2023-09-17: 8 mg via ORAL
  Filled 2023-09-17: qty 21

## 2023-09-17 NOTE — TOC Progression Note (Signed)
 Transition of Care [facility]) - Progression Note    Patient Details  Name: Arthur [name] MRN: [id number] Date of Birth: 08-02-1940  Transition [facility]) CM/SW Contact  Tom-Johnson, Braydn Carneiro Daphne, RN Phone Number: 09/17/2023, 9:06 AM  Clinical Narrative:     CM spoke with patient and son, Vipul at bedside about needs for post hospital transition.  Vipul states patient lives with his elderly wife and she will not be abe to care for him at his current state. Vipul states patient needs to improve in the hospital and then discharge home. CM informed patient and Vipul that SNF was recommended to give patient more time to recover and build on his strength prior returning home. Vipul requested patient stay in the hospital for another day or two. CM informed Vipul that depends on his Medical readiness and MD. CM informed Vipul and patient that patient can discharge home with Home health disciplines but services will not be available daily with limited time which they requested. Patient does not have Medicaid and family will have to pay out of pocket for Personal Care Services. Understanding voiced and Vipul states they will think about going to SNF if patient does not improve.   CM will continue to follow as patient progresses with care towards discharge.             Expected Discharge Plan: Home w Home Health Services Barriers to Discharge: Continued Medical Work up               Expected Discharge Plan and Services       Living arrangements for the past 2 months: Single Family Home                                       Social Drivers of Health (SDOH) Interventions SDOH Screenings   Food Insecurity: No Food Insecurity (09/14/2023)  Housing: Low Risk  (09/15/2023)  Transportation Needs: No Transportation Needs (09/14/2023)  Utilities: Not At Risk (09/14/2023)  Alcohol Screen: Low Risk  (10/28/2022)  Depression (PHQ2-9): Low Risk  (06/25/2023)  Financial Resource  Strain: Low Risk  (10/28/2022)  Physical Activity: Insufficiently Active (10/28/2022)  Social Connections: Unknown (09/15/2023)  Stress: No Stress Concern Present (10/28/2022)  Tobacco Use: Low Risk  (09/14/2023)  Health Literacy: Adequate Health Literacy (10/28/2022)    Readmission Risk Interventions    06/19/2021    4:57 PM  Readmission Risk Prevention Plan  Transportation Screening Complete  PCP or Specialist Appt within 3-5 Days Complete  HRI or Home Care Consult Complete  Social Work Consult for Recovery Care Planning/Counseling Complete  Medication Review Oceanographer) Complete

## 2023-09-17 NOTE — Plan of Care (Signed)

## 2023-09-17 NOTE — Assessment & Plan Note (Addendum)
 Patient reports he has not eaten well in 1 week due to some nausea.  Endorses good appetite. - will obtain further hx - consider RD consult - RN informed to document when patient is eating - [name] daughter bringing food from home and encouraging water  intake

## 2023-09-17 NOTE — Assessment & Plan Note (Addendum)
 s/p 1U transfusion, Hgb 10.2.   - Continue home prednisone  10 mg daily - f/u haptoglobin - Creatinine improved to 1.23

## 2023-09-17 NOTE — Assessment & Plan Note (Addendum)
 Improved. Denies any emesis -Zofran  4 mg ODT 30 minutes before meals

## 2023-09-17 NOTE — Plan of Care (Signed)
 Went to evaluate the patient at bedside with Dr. Howell. He recently finished ambulating around the unit with PT. We discussed his poor appetite, and he says he feels like his appetite has been improving. His wife says he had a few spoonfuls of beans and rice for lunch today. When speaking with the patient this morning, he endorses some decreased appetite for the past week. His daughter-in-law was on the phone, and she feels his poor appetite has been going on for some time. He denies any vomiting or pain with eating. His BM have been regular. His DIL says he will have only a few bites of food before he feels full. We discussed this may be 2/2 to his cancer and comorbidity. We are reaching out to his oncologist for suggestions. Discussed SNF placement as well, and family is okay with this. Will reach out to [facility] for SNF placement.

## 2023-09-17 NOTE — Assessment & Plan Note (Addendum)
 Improving. Manage pain symptomatically - Admitted to FMTS service, Dr. Rumball attending - Pain regimen: Tylenol  1g every 6 hour scheduled, oxycodone  2.5-5 mg every 4 hours as needed, lidocaine  patch - heat pack as needed - Diclofenac  gel 1% 2 g 4 times daily as needed for shoulder pain - PT/OT

## 2023-09-17 NOTE — Assessment & Plan Note (Addendum)
 Static vitals obtained during PT session yesterday, drop of over diastolic from supine to standing after 3 minutes. Patient complaining of dizziness when opening his eyes, this resolves after a few minutes, pmhx BPPV. - Consider fluid bolus

## 2023-09-17 NOTE — Assessment & Plan Note (Signed)
 T2DM: A1c 4.9, not currently on therapy.  Will monitor with daily labs. HLD: Continue home rosuvastatin  5 mg daily GERD: Continue home pantoprazole  40 mg daily Anxiety: Continue BuSpar  5 mg daily CAD: Continue Plavix  75 mg daily Stage IV lung cancer: Continue alectinib 600 mg twice daily.

## 2023-09-17 NOTE — [place] FL2 (Signed)
 [place]  MEDICAID FL2 LEVEL OF CARE FORM     IDENTIFICATION  Patient Name: Arthur [name] Birthdate: 1940/03/27 Sex: male Admission Date (Current Location): 09/14/2023  [facility] and [place] Number:  Producer, television/film/video and Address:  [facility], [address] [place], [place] [address]      Provider Number: [id number]  Attending Physician Name and Address:  Madelon Donald HERO, DO  Relative Name and Phone Number:  Christine Schiefelbein; [place]; [phone]    Current Level of Care: Hospital Recommended Level of Care: Skilled Nursing Facility Prior Approval Number:    Date Approved/Denied:   PASRR Number: [id number]  Discharge Plan: SNF    Current Diagnoses: Patient Active Problem List   Diagnosis Date Noted   Orthostatic hypotension 09/17/2023   Decreased oral intake 09/17/2023   Nausea 09/16/2023   Chronic health problem 09/15/2023   Nontraumatic complete tear of left rotator cuff 09/15/2023   Left shoulder pain 09/14/2023   Slurred speech 09/14/2023   Duodenal ulcer 10/20/2022   Chronic anticoagulation 10/19/2022   Paroxysmal atrial fibrillation (HCC) 10/18/2022   Renal insufficiency 07/04/2021   Foot drop, left foot 07/04/2021   Non-small cell carcinoma of lung, stage 4 (HCC) 06/15/2021   Encounter for antineoplastic chemotherapy 06/13/2021   Deep venous embolism and thrombosis (HCC) 05/16/2021   Inguinal hernia 05/16/2021   Primary adenocarcinoma of lung (HCC) 04/09/2021   Cerebral embolism with cerebral infarction 03/18/2021   Status post coronary artery stent placement    Coronary artery disease due to lipid rich plaque    Pulmonary embolus (HCC) 02/03/2021   Weight loss 03/08/2020   Anemia 09/06/2015   Hematoma 09/09/2013   GERD (gastroesophageal reflux disease) 07/05/2013   Weakness 01/20/2012   Arthritis 02/26/2010   Gout 11/06/2009   Diabetes mellitus, type II (HCC) 09/22/2009   Hyperlipidemia 09/22/2009   Essential hypertension,  benign 09/22/2009    Orientation RESPIRATION BLADDER Height & Weight     Time, Place, Situation, Self  Normal (Room Air) Continent Weight: 155 lb (70.3 kg) Height:  5' 11 (180.3 cm)  BEHAVIORAL SYMPTOMS/MOOD NEUROLOGICAL BOWEL NUTRITION STATUS      Continent Diet (Please see discharge summary)  AMBULATORY STATUS COMMUNICATION OF NEEDS Skin   Extensive Assist Verbally Normal                       Personal Care Assistance Level of Assistance  Bathing, Feeding, Dressing Bathing Assistance: Maximum assistance Feeding assistance: Maximum assistance Dressing Assistance: Maximum assistance     Functional Limitations Info             SPECIAL CARE FACTORS FREQUENCY  PT (By licensed PT), OT (By licensed OT)     PT Frequency: 5x OT Frequency: 5x            Contractures Contractures Info: Not present    Additional Factors Info  Code Status, Allergies Code Status Info: Full Code Allergies Info: Ice Inhibitors and Iohexol            Current Medications (09/17/2023):  This is the current hospital active medication list Current Facility-Administered Medications  Medication Dose Route Frequency Provider Last Rate Last Admin   acetaminophen  (TYLENOL ) tablet 1,000 mg  1,000 mg Oral Q6H Theophilus Pagan, MD   1,000 mg at 09/17/23 1001   alectinib (ALECENSA ) capsule 600 mg-Pt's Own Supply  600 mg Oral BID WC Theophilus Pagan, MD   600 mg at 09/17/23 0803   busPIRone  (BUSPAR ) tablet  5 mg  5 mg Oral BID Theophilus Pagan, MD   5 mg at 09/17/23 1001   clopidogrel  (PLAVIX ) tablet 75 mg  75 mg Oral Q breakfast Theophilus Pagan, MD   75 mg at 09/17/23 0804   diclofenac  Sodium (VOLTAREN ) 1 % topical gel 2 g  2 g Topical QID PRN [name], [name], DO   2 g at 09/16/23 1358   enoxaparin  (LOVENOX ) injection 70 mg  70 mg Subcutaneous Q12H Theophilus Pagan, MD   70 mg at 09/17/23 1000   lidocaine  (LIDODERM ) 5 % 1 patch  1 patch Transdermal Q24H Theophilus Pagan, MD   1 patch at 09/15/23  9374   ondansetron  (ZOFRAN -ODT) disintegrating tablet 4 mg  4 mg Oral Q6H PRN Faunce, Alina, DO   4 mg at 09/17/23 1156   oxyCODONE  (Oxy IR/ROXICODONE ) immediate release tablet 2.5-5 mg  2.5-5 mg Oral Q4H PRN [name], [name], DO   5 mg at 09/17/23 1155   pantoprazole  (PROTONIX ) EC tablet 40 mg  40 mg Oral BID Lupie Credit, DO   40 mg at 09/17/23 1001   predniSONE  (DELTASONE ) tablet 10 mg  10 mg Oral Q breakfast Theophilus Pagan, MD   10 mg at 09/17/23 9195   rosuvastatin  (CRESTOR ) tablet 5 mg  5 mg Oral Daily Theophilus Pagan, MD   5 mg at 09/17/23 1001     Discharge Medications: Please see discharge summary for a list of discharge medications.  Relevant Imaging Results:  Relevant Lab Results:   Additional Information SS# [id number]  Lauraine FORBES Saa, LCSWA

## 2023-09-17 NOTE — Plan of Care (Signed)
 Was able to speak with patient's oncologist Dr. Sherrod via secure chat.  Given his poor appetite, and current hospitalization.  He recommends holding alectinib for 1 week.  And starting Medrol  Dosepak in addition to his 10 mg prednisone  daily.  They will see him outpatient.  We greatly appreciate their assistance in this patient's care.

## 2023-09-17 NOTE — Assessment & Plan Note (Signed)
 Chronic in left LE - Continue treatment dose Lovenox 

## 2023-09-17 NOTE — Progress Notes (Addendum)
 Daily Progress Note Intern Pager: [phone]  Patient name: Arthur [name] Medical record number: [id number] Date of birth: 23-Nov-1940 Age: 83 y.o. Gender: male  Primary Care Provider: Stoney Blizzard, DO Consultants: ortho, oncology, hematology  Code Status: FULL  Pt Overview and Major Events to Date:  8/17: admitted  Medical Decision Making: Pheonix R [name] is a 83 y.o. male admitted for left shoulder pain. Pertinent PMH/PSH includes chronic DVT, multifactorial anemia, and primary adenocarcinoma of the lung.  Assessment & Plan Left shoulder pain Hematoma Nontraumatic complete tear of left rotator cuff Improving. Manage pain symptomatically - Admitted to FMTS service, Dr. Rumball attending - Pain regimen: Tylenol  1g every 6 hour scheduled, oxycodone  2.5-5 mg every 4 hours as needed, lidocaine  patch - heat pack as needed - Diclofenac  gel 1% 2 g 4 times daily as needed for shoulder pain - PT/OT  Decreased oral intake Patient reports he has not eaten well in 1 week due to some nausea.  Endorses good appetite. - will obtain further hx - consider RD consult - RN informed to document when patient is eating - [name] daughter bringing food from home and encouraging water  intake Orthostatic hypotension Static vitals obtained during PT session yesterday, drop of over diastolic from supine to standing after 3 minutes. Patient complaining of dizziness when opening his eyes, this resolves after a few minutes, pmhx BPPV. - Consider fluid bolus Anemia s/p 1U transfusion, Hgb 10.2.   - Continue home prednisone  10 mg daily - f/u haptoglobin - Creatinine improved to 1.23 Nausea Improved. Denies any emesis -Zofran  4 mg ODT 30 minutes before meals Deep venous embolism and thrombosis (HCC) Chronic in left LE - Continue treatment dose Lovenox  Chronic health problem T2DM: A1c 4.9, not currently on therapy.  Will monitor with daily labs. HLD: Continue home rosuvastatin  5  mg daily GERD: Continue home pantoprazole  40 mg daily Anxiety: Continue BuSpar  5 mg daily CAD: Continue Plavix  75 mg daily Stage IV lung cancer: Continue alectinib 600 mg twice daily.  FEN/GI: Heart healthy diet PPx: Treatment dose Lovenox , IVC filter Dispo:SNF   Subjective:  Patient says his shoulder pain is much improved with the heating pad and diclofenac  gel.  Patient also says the pain medication has been helping. Discussed disposition with patient and daughter, daughter is under the impression that if patient stays here until the end of the week doing PT/OT that he will no longer need SNF or home health services.    Objective: Temp:  [97.4 F (36.3 C)-98 F (36.7 C)] 97.5 F (36.4 C) (08/20 0500) Pulse Rate:  [96-110] 110 (08/20 0500) Resp:  [16] 16 (08/20 0500) BP: (108-137)/(60-78) 137/60 (08/20 0500) SpO2:  [99 %-100 %] 99 % (08/20 0500) Physical Exam: General: Resting in bed, NAD Respiratory: Normal work of breathing on room air Abdomen: Soft, nontender, nondistended Extremities: Symmetrical, nonedematous, NTTP  Laboratory: Most recent CBC Lab Results  Component Value Date   WBC 6.6 09/17/2023   HGB 10.2 (L) 09/17/2023   HCT 29.9 (L) 09/17/2023   MCV 82.1 09/17/2023   PLT 357 09/17/2023   Most recent BMP    Latest Ref Rng & Units 09/15/2023    4:18 AM  BMP  Glucose 70 - 99 mg/dL 87   BUN 8 - 23 mg/dL 16   Creatinine 9.38 - 1.24 mg/dL 8.65   Sodium 864 - 854 mmol/L 138   Potassium 3.5 - 5.1 mmol/L 4.5   Chloride 98 - 111 mmol/L 105  CO2 22 - 32 mmol/L 26   Calcium  8.9 - 10.3 mg/dL 7.7     Arthur Nakai, DO 09/17/2023, 7:12 AM  PGY-1, [facility] FPTS Intern pager: [phone], text pages welcome Secure chat group [facility] Teaching Service

## 2023-09-17 NOTE — Progress Notes (Signed)
 Physical Therapy Treatment Patient Details Name: Arthur [name] MRN: [id number] DOB: May 27, 1940 Today's Date: 09/17/2023   History of Present Illness Pt is an 83 y/o M presenting to ED with L shoulder pain x3 days and slurred speech. MRI with rotator cuff tears, tendinosis, and large subscapular hematoma in the setting of chronic anticoagulation. Ortho consulted and recommending conservating mgmt. PMH includes DM, high cholesterol, HTN, lung CA    PT Comments  Continuing work on functional mobility and activity tolerance;  Session focused on getting more BPs with position changes and activity (see below or vitals flowsheets); We also prioritized initial gait bouts with success; 2 gait bouts with RW and bilateral support and chair follow, 46ft, and then 7 ft; Very nice effort, and noting his dizziness corealted with incr time in standing as follows:     09/17/23 1155 09/17/23 1200 09/17/23 1202  Orthostatic Lying   BP- Lying 117/62  --   --   Pulse- Lying 92  --   --   Orthostatic Sitting  BP- Sitting 111/69 136/49  --   Pulse- Sitting 100 100 (taken seated after unable to get a standing BP)  --   Orthostatic Standing at 0 minutes  BP- Standing at 0 minutes 98/76  --  (!) 79/69  Pulse- Standing at 0 minutes 95  --  94; dizzy (taken after another standing bout)  Orthostatic Standing at 3 minutes  BP- Standing at 3 minutes 115/82  --   --   Pulse- Standing at 3 minutes 103  --   --     09/17/23 1205 09/17/23 1208  Orthostatic Lying   BP- Lying  --   --   Pulse- Lying  --   --   Orthostatic Sitting  BP- Sitting (!) 87/69 122/65  Pulse- Sitting 105 (after standing bout)Dizzy 89 (reclined feet up, end of session)  Orthostatic Standing at 0 minutes  BP- Standing at 0 minutes  --   --   Pulse- Standing at 0 minutes  --   --   Orthostatic Standing at 3 minutes  BP- Standing at 3 minutes  --   --   Pulse- Standing at 3 minutes  --   --       If plan is discharge home, recommend  the following: A lot of help with walking and/or transfers;Two people to help with walking and/or transfers;A lot of help with bathing/dressing/bathroom   Can travel by private vehicle     No  Equipment Recommendations  Other (comment) (worth considering drop-arm BSC)    Recommendations for Other Services OT consult     Precautions / Restrictions Precautions Precautions: Fall;Other (comment) Precaution/Restrictions Comments: LUE relative rest per ortho, sling for comfort Required Braces or Orthoses: Sling Restrictions Weight Bearing Restrictions Per Provider Order: No Other Position/Activity Restrictions: LUE relative rest; sling for comfort     Mobility  Bed Mobility Overal bed mobility: Needs Assistance Bed Mobility: Rolling, Sidelying to Sit Rolling: Mod assist Sidelying to sit: Mod assist       General bed mobility comments: Lighter mod assist to sit up to EOB today    Transfers Overall transfer level: Needs assistance Equipment used: Rolling walker (2 wheels) Transfers: Sit to/from Stand Sit to Stand: Min assist, Mod assist, +2 physical assistance           General transfer comment: Min assist to stand from elevated bed; stood x2 from lower recliner seat, with heavy mod assist to power up to stand  Ambulation/Gait Ambulation/Gait assistance: Mod assist, +2 physical assistance Gait Distance (Feet): 10 Feet (20ft +68ft) Assistive device: Rolling walker (2 wheels) Gait Pattern/deviations: Decreased step length - right, Decreased step length - left Gait velocity: slow     General Gait Details: Gait distance limited by dizziness, but able to take a seated rest break, an dtry for a second bout of gait; chair follow behind to incr confidence   Stairs             Wheelchair Mobility     Tilt Bed    Modified Rankin (Stroke Patients Only)       Balance     Sitting balance-Leahy Scale: Fair       Standing balance-Leahy Scale: Poor                               Communication Communication Communication: Impaired Factors Affecting Communication: Difficulty expressing self (prior CVA affected speech)  Cognition Arousal: Alert Behavior During Therapy: WFL for tasks assessed/performed                           PT - Cognition Comments: Brighter disposition today Following commands: Intact      Cueing Cueing Techniques: Verbal cues, Tactile cues  Exercises      General Comments General comments (skin integrity, edema, etc.): Still with BP drop in standing      Pertinent Vitals/Pain Pain Assessment Pain Assessment: 0-10 Pain Score: 7  Pain Location: L shoulder Pain Descriptors / Indicators: Grimacing, Guarding Pain Intervention(s): Limited activity within patient's tolerance    Home Living                          Prior Function            PT Goals (current goals can now be found in the care plan section) Acute Rehab PT Goals Patient Stated Goal: Less pain in shoulder so he can do more PT Goal Formulation: With patient Time For Goal Achievement: 09/28/23 Potential to Achieve Goals: Good Progress towards PT goals: Progressing toward goals    Frequency    Min 2X/week      PT Plan      Co-evaluation              AM-PAC PT 6 Clicks Mobility   Outcome Measure  Help needed turning from your back to your side while in a flat bed without using bedrails?: A Little Help needed moving from lying on your back to sitting on the side of a flat bed without using bedrails?: A Little Help needed moving to and from a bed to a chair (including a wheelchair)?: A Lot Help needed standing up from a chair using your arms (e.g., wheelchair or bedside chair)?: A Lot Help needed to walk in hospital room?: A Lot Help needed climbing 3-5 steps with a railing? : Total 6 Click Score: 13    End of Session Equipment Utilized During Treatment: Gait belt Activity Tolerance: Other  (comment) (limited by nausea, and lightheadedness in sitting) Patient left: in chair;with call bell/phone within reach;with family/visitor present Nurse Communication: Mobility status (BP drop) PT Visit Diagnosis: Unsteadiness on feet (R26.81);Other abnormalities of gait and mobility (R26.89);Pain;Muscle weakness (generalized) (M62.81);Hemiplegia and hemiparesis Hemiplegia - Right/Left: Left Hemiplegia - dominant/non-dominant: Non-dominant Hemiplegia - caused by: Cerebral infarction Pain - Right/Left: Left Pain - part of body:  Shoulder     Time: 8891-8841 PT Time Calculation (min) (ACUTE ONLY): 50 min  Charges:    $Gait Training: 23-37 mins $Therapeutic Activity: 8-22 mins PT General Charges $$ ACUTE PT VISIT: 1 Visit                     Silvano Currier, PT  Acute Rehabilitation Services Office [phone] Secure Chat welcomed    Silvano VEAR Currier 09/17/2023, 3:20 PM

## 2023-09-17 NOTE — TOC Progression Note (Signed)
 Transition of Care [facility]) - Progression Note    Patient Details  Name: Arthur [name] MRN: [id number] Date of Birth: [birth year]  [facility] CM/SW Contact  Lauraine FORBES Saa, LCSWA Phone Number: 09/17/2023, 2:41 PM  Clinical Narrative:     2:41 PM Per MD resident, patient and patient's family are now agreeable with patient discharging to SNF and expressed interest in SNFs in [facility]. CSW sent Fl2's to SNFs within [facility].  Expected Discharge Plan: Skilled Nursing Facility Barriers to Discharge: Continued Medical Work up, English as a second language teacher, SNF Pending bed offer               Expected Discharge Plan and Services In-house Referral: Clinical Social Work   Post Acute Care Choice: Skilled Nursing Facility Living arrangements for the past 2 months: Single Family Home                                       Social Drivers of Health (SDOH) Interventions SDOH Screenings   Food Insecurity: No Food Insecurity (09/14/2023)  Housing: Low Risk  (09/15/2023)  Transportation Needs: No Transportation Needs (09/14/2023)  Utilities: Not At Risk (09/14/2023)  Alcohol Screen: Low Risk  (10/28/2022)  Depression (PHQ2-9): Low Risk  (06/25/2023)  Financial Resource Strain: Low Risk  (10/28/2022)  Physical Activity: Insufficiently Active (10/28/2022)  Social Connections: Unknown (09/15/2023)  Stress: No Stress Concern Present (10/28/2022)  Tobacco Use: Low Risk  (09/14/2023)  Health Literacy: Adequate Health Literacy (10/28/2022)    Readmission Risk Interventions    06/19/2021    4:57 PM  Readmission Risk Prevention Plan  Transportation Screening Complete  PCP or Specialist Appt within 3-5 Days Complete  HRI or Home Care Consult Complete  Social Work Consult for Recovery Care Planning/Counseling Complete  Medication Review Oceanographer) Complete

## 2023-09-18 LAB — BASIC METABOLIC PANEL WITH GFR
Anion gap: 10 (ref 5–15)
BUN: 15 mg/dL (ref 8–23)
CO2: 27 mmol/L (ref 22–32)
Calcium: 7.8 mg/dL — ABNORMAL LOW (ref 8.9–10.3)
Chloride: 101 mmol/L (ref 98–111)
Creatinine, Ser: 1.13 mg/dL (ref 0.61–1.24)
GFR, Estimated: >60 mL/min (ref >60)
Glucose, Bld: 113 mg/dL — ABNORMAL HIGH (ref 70–99)
Potassium: 4.3 mmol/L (ref 3.5–5.1)
Sodium: 138 mmol/L (ref 135–145)

## 2023-09-18 LAB — CBC
HCT: 28.7 % — ABNORMAL LOW (ref 39.0–52.0)
Hemoglobin: 9.9 g/dL — ABNORMAL LOW (ref 13.0–17.0)
MCH: 28.1 pg (ref 26.0–34.0)
MCHC: 34.5 g/dL (ref 30.0–36.0)
MCV: 81.5 fL (ref 80.0–100.0)
Platelets: 402 K/uL — ABNORMAL HIGH (ref 150–400)
RBC: 3.52 MIL/uL — ABNORMAL LOW (ref 4.22–5.81)
RDW: 19.2 % — ABNORMAL HIGH (ref 11.5–15.5)
WBC: 4.3 K/uL (ref 4.0–10.5)
nRBC: 0 % (ref 0.0–0.2)

## 2023-09-18 NOTE — Plan of Care (Signed)

## 2023-09-18 NOTE — Assessment & Plan Note (Addendum)
 Improving. Manage pain symptomatically - Admitted to FMTS service, Dr. Rumball attending - Pain regimen: Tylenol  1g every 6 hour scheduled, oxycodone  2.5-5 mg every 4 hours as needed, lidocaine  patch - heat pack as needed - Diclofenac  gel 1% 2 g 4 times daily as needed for shoulder pain - PT/OT

## 2023-09-18 NOTE — Assessment & Plan Note (Signed)
 Chronic in left LE - Continue treatment dose Lovenox 

## 2023-09-18 NOTE — TOC Progression Note (Signed)
 Transition of Care [facility]) - Progression Note    Patient Details  Name: Arthur [name] MRN: [id number] Date of Birth: Jun 26, 1940  Transition [facility] Care [facility]) CM/SW Contact  Gwenn Frieze [place], [place] Phone Number: 09/18/2023, 4:08 PM  Clinical Narrative: Provided pt and pt's wife with current SNF offers. Pt called his DIL during visit, provided update to her as well. Family to review facilities and will update SW on choice.   Frieze Gwenn, MSW, LCSW [phone] (coverage)        Expected Discharge Plan: Skilled Nursing Facility Barriers to Discharge: Continued Medical Work up, English as a second language teacher, SNF Pending bed offer               Expected Discharge Plan and Services In-house Referral: Clinical Social Work   Post Acute Care Choice: Skilled Nursing Facility Living arrangements for the past 2 months: Single Family Home                                       Social Drivers of Health (SDOH) Interventions SDOH Screenings   Food Insecurity: No Food Insecurity (09/14/2023)  Housing: Low Risk  (09/15/2023)  Transportation Needs: No Transportation Needs (09/14/2023)  Utilities: Not At Risk (09/14/2023)  Alcohol Screen: Low Risk  (10/28/2022)  Depression (PHQ2-9): Low Risk  (06/25/2023)  Financial Resource Strain: Low Risk  (10/28/2022)  Physical Activity: Insufficiently Active (10/28/2022)  Social Connections: Unknown (09/15/2023)  Stress: No Stress Concern Present (10/28/2022)  Tobacco Use: Low Risk  (09/14/2023)  Health Literacy: Adequate Health Literacy (10/28/2022)    Readmission Risk Interventions    06/19/2021    4:57 PM  Readmission Risk Prevention Plan  Transportation Screening Complete  PCP or Specialist Appt within 3-5 Days Complete  HRI or Home Care Consult Complete  Social Work Consult for Recovery Care Planning/Counseling Complete  Medication Review Oceanographer) Complete

## 2023-09-18 NOTE — Assessment & Plan Note (Addendum)
 s/p 1U transfusion, Hgb 9.9   - Continue home prednisone  10 mg daily - f/u haptoglobin

## 2023-09-18 NOTE — Progress Notes (Signed)
 Physical Therapy Treatment Patient Details Name: Arthur [name] MRN: [id number] DOB: 1940-12-06 Today's Date: 09/18/2023   History of Present Illness Pt is an 83 y/o M presenting to ED with L shoulder pain x3 days and slurred speech. MRI with rotator cuff tears, tendinosis, and large subscapular hematoma in the setting of chronic anticoagulation. Ortho consulted and recommending conservating mgmt. PMH includes DM, high cholesterol, HTN, lung CA    PT Comments  Pt agreeable to participate. Pt with TED hose donned; denies dizziness this session, BP 103/88 (94), HR 90. Pt requiring min-mod assist (+2 safety) for functional mobility. Worked on progressive gait trials with close chair follow. Pt fatigues easily and requires rest break in between short bouts of walking. Recommend post acute rehab.    If plan is discharge home, recommend the following: A lot of help with walking and/or transfers;Two people to help with walking and/or transfers;A lot of help with bathing/dressing/bathroom   Can travel by private vehicle     No  Equipment Recommendations  Other (comment) (worth considering drop-arm BSC)    Recommendations for Other Services OT consult     Precautions / Restrictions Precautions Precautions: Fall;Other (comment) Precaution/Restrictions Comments: LUE relative rest per ortho, sling for comfort Required Braces or Orthoses: Sling Restrictions Weight Bearing Restrictions Per Provider Order: No Other Position/Activity Restrictions: LUE relative rest; sling for comfort     Mobility  Bed Mobility Overal bed mobility: Needs Assistance Bed Mobility: Rolling, Sidelying to Sit Rolling: Min assist Sidelying to sit: Mod assist       General bed mobility comments: Exiting towards R side of bed, pt using hand to guide LLE over to edge, assist at bed pad to pivot hip to edge and at trunk to elevate    Transfers Overall transfer level: Needs assistance Equipment used: Rolling  walker (2 wheels) Transfers: Sit to/from Stand Sit to Stand: Min assist, Mod assist           General transfer comment: Min-modA to push up to stand, consistent cueing for hand placement    Ambulation/Gait Ambulation/Gait assistance: Min assist, +2 safety/equipment Gait Distance (Feet): 8 Feet (8 ft, 6 ft, 10 ft) Assistive device: Rolling walker (2 wheels) Gait Pattern/deviations: Decreased stride length Gait velocity: slow Gait velocity interpretation: <1.8 ft/sec, indicate of risk for recurrent falls   General Gait Details: slow pace, minA for stability, pt intermittently placing L hand on front bar of walker vs handle bar, chair follow utilized and pt indicating when to take break   Optometrist     Tilt Bed    Modified Rankin (Stroke Patients Only)       Balance Overall balance assessment: Needs assistance Sitting-balance support: Feet supported Sitting balance-Leahy Scale: Fair     Standing balance support: Bilateral upper extremity supported Standing balance-Leahy Scale: Poor                              Communication Communication Communication: Impaired Factors Affecting Communication: Difficulty expressing self (prior CVA affected speech)  Cognition Arousal: Alert Behavior During Therapy: WFL for tasks assessed/performed   PT - Cognitive impairments: Memory                         Following commands: Intact      Cueing Cueing Techniques: Verbal cues, Tactile cues  Exercises  General Comments        Pertinent Vitals/Pain Pain Assessment Pain Assessment: Faces Faces Pain Scale: Hurts little more Pain Location: L shoulder Pain Descriptors / Indicators: Grimacing, Guarding Pain Intervention(s): Limited activity within patient's tolerance, Monitored during session, Repositioned, Heat applied    Home Living                          Prior Function            PT Goals  (current goals can now be found in the care plan section) Acute Rehab PT Goals Patient Stated Goal: Less pain in shoulder so he can do more PT Goal Formulation: With patient Time For Goal Achievement: 09/28/23 Potential to Achieve Goals: Good Progress towards PT goals: Progressing toward goals    Frequency    Min 2X/week      PT Plan      Co-evaluation              AM-PAC PT 6 Clicks Mobility   Outcome Measure  Help needed turning from your back to your side while in a flat bed without using bedrails?: A Little Help needed moving from lying on your back to sitting on the side of a flat bed without using bedrails?: A Little Help needed moving to and from a bed to a chair (including a wheelchair)?: A Lot Help needed standing up from a chair using your arms (e.g., wheelchair or bedside chair)?: A Lot Help needed to walk in hospital room?: A Lot Help needed climbing 3-5 steps with a railing? : Total 6 Click Score: 13    End of Session Equipment Utilized During Treatment: Gait belt Activity Tolerance: Patient tolerated treatment well Patient left: in chair;with call bell/phone within reach;with family/visitor present;with chair alarm set   PT Visit Diagnosis: Unsteadiness on feet (R26.81);Other abnormalities of gait and mobility (R26.89);Pain;Muscle weakness (generalized) (M62.81);Hemiplegia and hemiparesis Hemiplegia - Right/Left: Left Hemiplegia - dominant/non-dominant: Non-dominant Hemiplegia - caused by: Cerebral infarction Pain - Right/Left: Left Pain - part of body: Shoulder     Time: 1403-1430 PT Time Calculation (min) (ACUTE ONLY): 27 min  Charges:    $Therapeutic Activity: 23-37 mins PT General Charges $$ ACUTE PT VISIT: 1 Visit                     Arthur [name], PT, DPT Acute Rehabilitation Services Office [phone]    Arthur [name] 09/18/2023, 4:06 PM

## 2023-09-18 NOTE — Progress Notes (Signed)
     Daily Progress Note Intern Pager: [phone]  Patient name: Arthur [name] Medical record number: [id number] Date of birth: 03-07-1940 Age: 83 y.o. Gender: male  Primary Care Provider: Stoney Blizzard, DO Consultants: Arthur [name], oncology, hematology Code Status: FULL  Pt Overview and Major Events to Date:  8/17: Admitted  Medical Decision Making: Arthur [name] is a 83 y.o. male admitted for left shoulder pain. Pertinent PMH/PSH includes chronic DVT, multifactorial anemia, and primary adenocarcinoma of the lung.   Medically ready for d/c to SNF. Assessment & Plan Left shoulder pain Hematoma Nontraumatic complete tear of left rotator cuff Improving. Manage pain symptomatically - Admitted to FMTS service, Dr. Rumball attending - Pain regimen: Tylenol  1g every 6 hour scheduled, oxycodone  2.5-5 mg every 4 hours as needed, lidocaine  patch - heat pack as needed - Diclofenac  gel 1% 2 g 4 times daily as needed for shoulder pain - PT/OT  Decreased oral intake Improving.  - pause alectinib for 1 week (day 1/7) medrol  dosepak per oncology - consider RD consult - [name] daughter bringing food from home  - Encouraging p.o. water  intake Orthostatic hypotension Dizziness improving and able to ambulate with PT. over decrease diastolic from supine to standing after 3 minutes - Encouraging p.o. water  intake Anemia s/p 1U transfusion, Hgb 9.9   - Continue home prednisone  10 mg daily - f/u haptoglobin Nausea Improved. Denies any emesis -Zofran  4 mg ODT 30 minutes before meals Deep venous embolism and thrombosis (HCC) Chronic in left LE - Continue treatment dose Lovenox  Chronic health problem T2DM: A1c 4.9, not currently on therapy.  Will monitor with daily labs. HLD: Continue home rosuvastatin  5 mg daily GERD: Continue home pantoprazole  40 mg daily Anxiety: Continue BuSpar  5 mg daily CAD: Continue Plavix  75 mg daily Stage IV lung cancer: pause alectinib 600 mg for  1 week for appetite  FEN/GI: Heart healthy PPx: Tx dose Lovenox , IVC filter Dispo:SNF pending placement  Subjective:  Patient is reporting his shoulder pain is a 4-5/10 from previously 7-8/10. His appetite is also improving. He was able to have a hamburger for dinner.   Objective: Temp:  [97.7 F (36.5 C)-98.2 F (36.8 C)] 97.7 F (36.5 C) (08/21 0533) Pulse Rate:  [83-128] 98 (08/21 0533) Resp:  [16] 16 (08/21 0533) BP: (105-121)/(53-61) 116/61 (08/21 0533) SpO2:  [97 %-98 %] 98 % (08/21 0533) Physical Exam: General: Resting in bed, NAD Cardiovascular: RRR Respiratory: CTAB, normal work of breathing on room air Abdomen: Soft, nontender, nondistended Extremities: Symmetric, nonedematous, no erythema or warmth around his knee  Laboratory: Most recent CBC Lab Results  Component Value Date   WBC 4.3 09/18/2023   HGB 9.9 (L) 09/18/2023   HCT 28.7 (L) 09/18/2023   MCV 81.5 09/18/2023   PLT 402 (H) 09/18/2023   Most recent BMP    Latest Ref Rng & Units 09/18/2023    5:04 AM  BMP  Glucose 70 - 99 mg/dL 886   BUN 8 - 23 mg/dL 15   Creatinine 9.38 - 1.24 mg/dL 8.86   Sodium 864 - 854 mmol/L 138   Potassium 3.5 - 5.1 mmol/L 4.3   Chloride 98 - 111 mmol/L 101   CO2 22 - 32 mmol/L 27   Calcium  8.9 - 10.3 mg/dL 7.8     Arthur Nakai, DO 09/18/2023, 7:38 AM  PGY-1, [facility] FPTS Intern pager: [phone], text pages welcome Secure chat group [facility] Teaching Service

## 2023-09-18 NOTE — Assessment & Plan Note (Addendum)
 Improving.  - pause alectinib for 1 week (day 1/7) medrol  dosepak per oncology - consider RD consult - [name] daughter bringing food from home  - Encouraging p.o. water  intake

## 2023-09-18 NOTE — Care Management Important Message (Signed)
 Important Message  Patient Details  Name: Arthur [name] MRN: [id number] Date of Birth: Oct 21, 1940   Important Message Given:  Yes - Medicare IM     Claretta Deed 09/18/2023, 2:13 PM

## 2023-09-18 NOTE — Assessment & Plan Note (Signed)
 T2DM: A1c 4.9, not currently on therapy.  Will monitor with daily labs. HLD: Continue home rosuvastatin  5 mg daily GERD: Continue home pantoprazole  40 mg daily Anxiety: Continue BuSpar  5 mg daily CAD: Continue Plavix  75 mg daily Stage IV lung cancer: pause alectinib 600 mg for 1 week for appetite

## 2023-09-18 NOTE — Assessment & Plan Note (Addendum)
 Dizziness improving and able to ambulate with PT. over decrease diastolic from supine to standing after 3 minutes - Encouraging p.o. water  intake

## 2023-09-18 NOTE — Assessment & Plan Note (Signed)
 Improved. Denies any emesis -Zofran  4 mg ODT 30 minutes before meals

## 2023-09-18 NOTE — Progress Notes (Signed)
 OT Cancellation Note  Patient Details Name: YOON BARCA MRN: [id number] DOB: August 23, 1940   Cancelled Treatment:    Reason Eval/Treat Not Completed: Other (comment). PT going in to see pt, OT will follow up tomorrow  Jacques Karna Loose 09/18/2023, 2:12 PM

## 2023-09-19 DIAGNOSIS — M75102 Unspecified rotator cuff tear or rupture of left shoulder, not specified as traumatic: Secondary | ICD-10-CM

## 2023-09-19 MED ORDER — KATE FARMS STANDARD 1.4 PO LIQD
325.0000 mL | Freq: Two times a day (BID) | ORAL | Status: DC
  Administered 2023-09-19 – 2023-09-22 (×5): 325 mL via ORAL
  Filled 2023-09-19 (×10): qty 325

## 2023-09-19 NOTE — Assessment & Plan Note (Addendum)
 Improving.  Diet changed from heart healthy to vegetarian. - pause alectinib for 1 week (day 2/7) medrol  dosepak per oncology - [name] daughter bringing food from home  - Encouraging p.o. water  intake - RD consulted - feeding supplementation 325mg  between meals BID

## 2023-09-19 NOTE — TOC Progression Note (Signed)
 Transition of Care [facility]) - Progression Note    Patient Details  Name: Arthur [name] MRN: [id number] Date of Birth: [birth year]  [facility]) CM/SW Contact  Lauraine FORBES Saa, LCSWA Phone Number: 09/19/2023, 4:36 PM  Clinical Narrative:     3:15PM CSW followed up with patient and patient's spouse on SNF decision. Patient and patient's spouse deferred SNF decision to patient's daughter in law, [name]. [name] informed CSW that they were deciding between [facility] and [facility] but has yet to make a decision. CSW provided additional SNF options (current SNF options are: [place], [facility], [address], [employer], [address], [address], [address], [address], [facility]).   4:39 PM CSW called [name] to follow up on SNF decision. [name] stated that a SNF decision has yet to be made as she it out of town but would follow up over the weekend.  Expected Discharge Plan: Skilled Nursing Facility Barriers to Discharge: Other (must enter comment), English as a second language teacher, Continued Medical Work up (SNF pending bed decision)               Expected Discharge Plan and Services In-house Referral: Clinical Social Work   Post Acute Care Choice: Skilled Nursing Facility Living arrangements for the past 2 months: Single Family Home                                       Social Drivers of Health (SDOH) Interventions SDOH Screenings   Food Insecurity: No Food Insecurity (09/14/2023)  Housing: Low Risk  (09/15/2023)  Transportation Needs: No Transportation Needs (09/14/2023)  Utilities: Not At Risk (09/14/2023)  Alcohol Screen: Low Risk  (10/28/2022)  Depression (PHQ2-9): Low Risk  (06/25/2023)  Financial Resource Strain: Low Risk  (10/28/2022)  Physical Activity: Insufficiently Active (10/28/2022)  Social Connections: Unknown (09/15/2023)  Stress: No Stress Concern Present (10/28/2022)  Tobacco Use: Low Risk  (09/14/2023)  Health  Literacy: Adequate Health Literacy (10/28/2022)    Readmission Risk Interventions    06/19/2021    4:57 PM  Readmission Risk Prevention Plan  Transportation Screening Complete  PCP or Specialist Appt within 3-5 Days Complete  HRI or Home Care Consult Complete  Social Work Consult for Recovery Care Planning/Counseling Complete  Medication Review Oceanographer) Complete

## 2023-09-19 NOTE — Progress Notes (Signed)
 Daily Progress Note Intern Pager: [phone]  Patient name: Arthur [name] Medical record number: [id number] Date of birth: 05-26-1940 Age: 83 y.o. Gender: male  Primary Care Provider: Stoney Blizzard, DO Consultants:  Kemp rana, oncology, hematology  Code Status: FULL  Pt Overview and Major Events to Date:  8/17: Admitted   Medical Decision Making: Arthur [name] is a 83 y.o. male admitted for left shoulder pain. Pertinent PMH/PSH includes chronic DVT, multifactorial anemia, and primary adenocarcinoma of the lung.    Medically stable for d/c to SNF. Assessment & Plan Left shoulder pain Hematoma Nontraumatic complete tear of left rotator cuff Improving. Manage pain symptomatically - Pain regimen: Tylenol  1g every 6 hour scheduled, oxycodone  2.5-5 mg every 4 hours as needed, lidocaine  patch - heat pack as needed - Diclofenac  gel 1% 2 g 4 times daily as needed for shoulder pain - PT/OT  Decreased oral intake Improving.  Diet changed from heart healthy to vegetarian. - pause alectinib for 1 week (day 2/7) medrol  dosepak per oncology - [name] daughter bringing food from home  - Encouraging p.o. water  intake - RD consulted - feeding supplementation 325mg  between meals BID Orthostatic hypotension Stable.  Dizziness improving and able to ambulate with PT. over decrease diastolic from supine to standing after 3 minutes - Encouraging p.o. water  intake Deep venous embolism and thrombosis (HCC) Stable.  Chronic in left LE - Continue treatment dose Lovenox  Anemia Stable.  S/p 1U transfusion, Hgb 9.9   - Continue home prednisone  10 mg daily - AM CBC, BMP if still here Nausea Improved. Denies any emesis -Zofran  4 mg ODT 30 minutes before meals Chronic health problem T2DM: A1c 4.9, not currently on therapy.  Will monitor with daily labs. HLD: Continue home rosuvastatin  5 mg daily GERD: Continue home pantoprazole  40 mg daily Anxiety: Continue BuSpar  5 mg  daily CAD: Continue Plavix  75 mg daily Stage IV lung cancer: pause alectinib 600 mg for 1 week for appetite   FEN/GI: Vegetarian PPx: Treatment dose Lovenox , IVC filter Dispo:SNF pending bed placement.  Subjective:  Patient is resting in his bed comfortably, says he has no new complaints today.  He endorses shoulder pain becoming better.  He says a physical therapy session went very well yesterday, denied any dizziness during the session.  He continues to have some decreased oral intake however this is improving.  He had a protein shake yesterday, and his diet has been changed from heart healthy to vegetarian which will hopefully encourage more oral intake.  Objective: Temp:  [98.1 F (36.7 C)-98.2 F (36.8 C)] 98.2 F (36.8 C) (08/22 0359) Pulse Rate:  [69-100] 100 (08/22 0359) Resp:  [18-19] 19 (08/22 0359) BP: (106-131)/(66-82) 126/66 (08/22 0359) SpO2:  [96 %-100 %] 100 % (08/22 0359) Physical Exam: General: resting in bed with covers over him Cardiovascular: Tachy, no r/m/g Respiratory: CTAB, normal work of breathing on room air Abdomen: Soft, nontender, nondistended Extremities: Cachectic, NTTP, symmetric BLE  Laboratory: Most recent CBC Lab Results  Component Value Date   WBC 4.3 09/18/2023   HGB 9.9 (L) 09/18/2023   HCT 28.7 (L) 09/18/2023   MCV 81.5 09/18/2023   PLT 402 (H) 09/18/2023   Most recent BMP    Latest Ref Rng & Units 09/18/2023    5:04 AM  BMP  Glucose 70 - 99 mg/dL 886   BUN 8 - 23 mg/dL 15   Creatinine 9.38 - 1.24 mg/dL 8.86   Sodium 864 - 854 mmol/L 138  Potassium 3.5 - 5.1 mmol/L 4.3   Chloride 98 - 111 mmol/L 101   CO2 22 - 32 mmol/L 27   Calcium  8.9 - 10.3 mg/dL 7.8    Arthur Nakai, DO 09/19/2023, 8:11 AM  PGY-1, [facility] FPTS Intern pager: [phone], text pages welcome Secure chat group [facility] Teaching Service

## 2023-09-19 NOTE — Progress Notes (Signed)
 Occupational Therapy Treatment Patient Details Name: Arthur [name] MRN: [id number] DOB: [birth year] Today's Date: 09/19/2023   History of present illness Pt is an 83 y/o M presenting to ED with L shoulder pain x3 days and slurred speech. MRI with rotator cuff tears, tendinosis, and large subscapular hematoma in the setting of chronic anticoagulation. Ortho consulted and recommending conservating mgmt. PMH includes DM, high cholesterol, HTN, lung CA   OT comments  Patient with mobility specialist upon entry. Patient performed gown change due to gown being wet with min assist and declined sling. Patient able to ambulate short distance to sink and performed grooming tasks standing at sink with sink for support and min assist. Patient performed transfer training with min/mod assist to stand and min assist for mobility and transfers. Patient will benefit from continued inpatient follow up therapy, <3 hours/day.  Acute OT to continue to follow to address established goals to facilitate DC to next venue of care.        If plan is discharge home, recommend the following:  A little help with walking and/or transfers;A lot of help with bathing/dressing/bathroom;A lot of help with walking and/or transfers;Direct supervision/assist for medications management;Direct supervision/assist for financial management;Assist for transportation;Help with stairs or ramp for entrance   Equipment Recommendations  Other (comment) (defer)    Recommendations for Other [name]      Precautions / Restrictions Precautions Precautions: Fall;Other (comment) Precaution/Restrictions Comments: LUE relative rest per ortho, sling for comfort Required Braces or Orthoses: Sling Restrictions Weight Bearing Restrictions Per Provider Order: No       Mobility Bed Mobility Overal bed mobility: Needs Assistance             General bed mobility comments: Patient on EOB with mobility    Transfers Overall transfer  level: Needs assistance Equipment used: Rolling walker (2 wheels) Transfers: Sit to/from Stand, Bed to chair/wheelchair/BSC Sit to Stand: Min assist, Mod assist     Step pivot transfers: Min assist     General transfer comment: min to mod assist for sit to stands due to LUE limitations and min assist for transfers     Balance Overall balance assessment: Needs assistance Sitting-balance support: Feet supported Sitting balance-Leahy Scale: Fair     Standing balance support: Single extremity supported, Bilateral upper extremity supported, During functional activity Standing balance-Leahy Scale: Poor Standing balance comment: reliant on external support                           ADL either performed or assessed with clinical judgement   ADL Overall ADL's : Needs assistance/impaired     Grooming: Wash/dry hands;Wash/dry face;Brushing hair;Minimal assistance;Standing Grooming Details (indicate cue type and reason): min assist while standing         Upper Body Dressing : Minimal assistance;Sitting Upper Body Dressing Details (indicate cue type and reason): to change gown     Toilet Transfer: Minimal assistance;Moderate assistance;Rolling walker (2 wheels) Toilet Transfer Details (indicate cue type and reason): simulated           General ADL Comments: min to mod assist for sit to stands    Extremity/Trunk Assessment              Vision       Perception     Praxis     Communication Communication Communication: Impaired Factors Affecting Communication: Difficulty expressing self (prior CVA affected speech)   Cognition Arousal: Alert Behavior During Therapy: Arthur [name] for tasks  assessed/performed                                 Following commands: Intact        Cueing   Cueing Techniques: Verbal cues, Tactile cues  Exercises      Shoulder Instructions       General Comments      Pertinent Vitals/ Pain       Pain  Assessment Pain Assessment: Faces Faces Pain Scale: Hurts little more Pain Location: L shoulder Pain Descriptors / Indicators: Grimacing, Guarding Pain Intervention(s): Limited activity within patient's tolerance, Monitored during session, Repositioned  Home Living                                          Prior Functioning/Environment              Frequency  Min 2X/week        Progress Toward Goals  OT Goals(current goals can now be found in the care plan section)  Progress towards OT goals: Progressing toward goals  Acute Rehab OT Goals Patient Stated Goal: none stated OT Goal Formulation: With patient Time For Goal Achievement: 09/28/23 Potential to Achieve Goals: Good ADL Goals Pt Will Perform Upper Body Dressing: with min assist;sitting Pt Will Perform Lower Body Dressing: with min assist;sitting/lateral leans;sit to/from stand Pt Will Transfer to Toilet: with min assist;ambulating;regular height toilet Pt Will Perform Tub/Shower Transfer: Tub transfer;Shower transfer;with min assist;ambulating;shower seat  Plan      Co-evaluation                 AM-PAC OT 6 Clicks Daily Activity     Outcome Measure   Help from another person eating meals?: A Little Help from another person taking care of personal grooming?: A Little Help from another person toileting, which includes using toliet, bedpan, or urinal?: A Lot Help from another person bathing (including washing, rinsing, drying)?: A Lot Help from another person to put on and taking off regular upper body clothing?: A Lot Help from another person to put on and taking off regular lower body clothing?: A Lot 6 Click Score: 14    End of Session Equipment Utilized During Treatment: Gait belt;Rolling walker (2 wheels)  OT Visit Diagnosis: Unsteadiness on feet (R26.81);Other abnormalities of gait and mobility (R26.89);Muscle weakness (generalized) (M62.81)   Activity Tolerance Patient  tolerated treatment well   Patient Left in chair;with call bell/phone within reach;with chair alarm set;with family/visitor present   Nurse Communication Mobility status        Time: 8954-8883 OT Time Calculation (min): 31 min  Charges: OT General Charges $OT Visit: 1 Visit OT Treatments $Self Care/Home Management : 23-37 mins  Dick Laine, OTA Acute Rehabilitation [name]  Office [phone]   Jeb LITTIE Laine 09/19/2023, 2:03 PM

## 2023-09-19 NOTE — Assessment & Plan Note (Addendum)
 Stable.  Dizziness improving and able to ambulate with PT. over decrease diastolic from supine to standing after 3 minutes - Encouraging p.o. water  intake

## 2023-09-19 NOTE — Assessment & Plan Note (Addendum)
 Stable.  S/p 1U transfusion, Hgb 9.9   - Continue home prednisone  10 mg daily - AM CBC, BMP if still here

## 2023-09-19 NOTE — Plan of Care (Signed)

## 2023-09-19 NOTE — Progress Notes (Signed)
 Initial Nutrition Assessment  DOCUMENTATION CODES:  Non-severe (moderate) malnutrition in context of chronic illness  INTERVENTION:  Continue vegetarian diet as ordered Encourage adequate intake of protein secondary to muscle deficits and cancer [employer] 1.4 PO BID, each supplement provides 455 kcal and 20 grams protein.  NUTRITION DIAGNOSIS:  Moderate Malnutrition related to chronic illness, cancer and cancer related treatments as evidenced by mild fat depletion, moderate muscle depletion.  GOAL:  Patient will meet greater than or equal to 90% of their needs  MONITOR:  PO intake, Supplement acceptance, Labs, Weight trends  REASON FOR ASSESSMENT:  Consult Assessment of nutrition requirement/status  ASSESSMENT:  Pt admitted with left shoulder pain.PMH significant for chronic DVT, anemia, primary adenocarcinoma of the lung.  Pt reported to be medically stable pending SNF placement.    Spoke with pt and his son at bedside. Most history provided with the assistance of his son.   He reports that his nausea and vomiting which occurred a couple days ago has much improved and his appetite is better with the adjustments in medication and addition of steroids.   This morning he consumed some bread and tea for breakfast. His son has brought some snacks for him to have including cereal, rice krispie treat and a protein poptart.   At home he follows a vegetarian diet. In the morning he will usually have eggs with cheese and tea. Lunch and dinner are typically vegetables and on occasion he eats beans. Pt endorses consuming very small meals.   Over the last month, pt's son reports that d/t his loss in appetite he has lost about 10-15 lbs.   Medications: medrol  dosepak, prednisone   Labs: reviewed  NUTRITION - FOCUSED PHYSICAL EXAM:  Flowsheet Row Most Recent Value  Orbital Region No depletion  Upper Arm Region Moderate depletion  Thoracic and Lumbar Region Mild depletion  Buccal  Region Mild depletion  Temple Region Mild depletion  Clavicle Bone Region Moderate depletion  Clavicle and Acromion Bone Region Moderate depletion  Scapular Bone Region Moderate depletion  Dorsal Hand Severe depletion  Patellar Region Mild depletion  Anterior Thigh Region Moderate depletion  Posterior Calf Region Mild depletion  Edema (RD Assessment) Mild  [BLE]  Hair Reviewed  Eyes Reviewed  Mouth Reviewed  Skin Reviewed  Nails Reviewed    Diet Order:   Diet Order             Diet vegetarian Room service appropriate? Yes; Fluid consistency: Thin  Diet effective now                   EDUCATION NEEDS:   Education needs have been addressed  Skin:  Skin Assessment: Reviewed RN Assessment  Last BM:  8/22 typ1 small; type 2 large  Height:   Ht Readings from Last 1 Encounters:  09/14/23 5' 11 (1.803 m)    Weight:   Wt Readings from Last 1 Encounters:  09/14/23 70.3 kg   BMI:  Body mass index is 21.62 kg/m.  Estimated Nutritional Needs:   Kcal:  1900-2100  Protein:  95-110g  Fluid:  >/=2L  Royce Maris, RDN, LDN Clinical Nutrition See [facility] for contact information.

## 2023-09-19 NOTE — Assessment & Plan Note (Signed)
 Improved. Denies any emesis -Zofran  4 mg ODT 30 minutes before meals

## 2023-09-19 NOTE — Progress Notes (Signed)
 Mobility Specialist Progress Note:   09/19/23 1202  Orthostatic Lying   BP- Lying 128/59 (MAP: 79)  Pulse- Lying 104  Orthostatic Sitting  BP- Sitting 125/76 (MAP: 86)  Pulse- Sitting 111  Orthostatic Standing at 3 minutes  BP- Standing at 3 minutes 139/69 (MAP: 83)  Pulse- Standing at 3 minutes 101  Mobility  Activity Ambulated with assistance (In room/ hallway)  Level of Assistance Minimal assist, patient does 75% or more  Assistive Device Front wheel walker  Distance Ambulated (ft) 8 ft (+12)  Activity Response Tolerated well  Mobility Referral Yes  Mobility visit 1 Mobility  Mobility Specialist Start Time (ACUTE ONLY) 1043  Mobility Specialist Stop Time (ACUTE ONLY) 1058  Mobility Specialist Time Calculation (min) (ACUTE ONLY) 15 min   Received pt in bed and agreeable to mobility. Required MinA to EOB and STS, otherwise contact guard. Took orthostatics on patient (see above). No c/o. Pt took x2 seated rest breaks d/t fatigue. Pt returned to room by chair. OT present. All needs met.  Lavanda Pollack Mobility Specialist  Please contact via [employer] or  Rehab Office [phone]

## 2023-09-19 NOTE — Assessment & Plan Note (Addendum)
 Improving. Manage pain symptomatically - Pain regimen: Tylenol  1g every 6 hour scheduled, oxycodone  2.5-5 mg every 4 hours as needed, lidocaine  patch - heat pack as needed - Diclofenac  gel 1% 2 g 4 times daily as needed for shoulder pain - PT/OT

## 2023-09-19 NOTE — Discharge Summary (Incomplete)
 Family Medicine Teaching [facility] Discharge Summary  Patient name: Arthur [name] Medical record number: [id number] Date of birth: 1940-08-16 Age: 83 y.o. Gender: male Date of Admission: 09/14/2023  Date of Discharge: 09/23/2023 Admitting Physician: Donald CHRISTELLA Lai, DO  Primary Care Provider: Stoney Blizzard, DO Consultants: ortho, oncology, hematology  Indication for Hospitalization: slurred speech, rotator cuff tear  Discharge Diagnoses/Problem List:  Principal Problem for Admission: rotator cuff tear Other Problems addressed during stay:  Principal Problem:   Left shoulder pain Active Problems:   Anemia   Primary adenocarcinoma of lung (HCC)   Weakness   Hematoma   Deep venous embolism and thrombosis (HCC)   Nontraumatic complete tear of left rotator cuff   Decreased oral intake   Nontraumatic tear of left rotator cuff   Malnutrition of moderate degree  Brief Hospital Course:  Arthur [name] is an 83yo M admitted for L shoulder pain x2 weeks. His hospital course is as outlined below:  Left shoulder pain Patient presented to the ED for primary complaint of L shoulder pain x2 weeks. MRI significant for intramuscular hematoma and multiple rotator cuff tears. Orthopedic surgery did not see indication for surgical intervention and recommended outpatient follow up. We placed a sling for comfort, and encouraged movement and relative rest to avoid stiffness. He received tylenol , oxycodone , lidocaine  patch, voltaren  gel, and heating pads for pain relief. Pain improved at time of discharge, with improvement of range of motion.   Anemia Baseline hemoglobin 8-9, decreased to 7.5 on admission. Received 1U PRBC to maintain goal of 8. No sign of active bleeding, FOBT negative. LDH was elevated but at baseline, haptoglobin <10 but at baseline, reticulocytes normal, indirect bilirubin normal. Chronic anemia followed outpatient and thought to be related to IDA, ACD, and  hemolysis from Alectinib. Hgb remained stable throughout the rest of his hospital stay.  Decreased Oral Intake Concern for decreased PO intake since admission. This improved with intermittent preprandial zofran . His Alectinib was paused and he received a medrol  dose pack to assist with stimulating his appetite.   Orthostatic Hypotension PT/OT have been having difficulty with BPs during sessions. This greatly improved after patient received 1U of blood. Patient has also had poor oral intake which could have been contributing.  Slurred speech Questionable slurred speech noticed intermittently by family the day prior to admission and by the ED. MRI head to rule out CVA indicative of no acute infarcts. This resolved.    Leg swelling Left lower extremity venous Doppler ordered this admission for leg swelling, showed chronic deep vein thrombosis involving the left posterior tibial veins, and left peroneal veins. Patient was continued on his home treatment dose Lovenox .   Cough Postprandial cough on admission, SLP recommendations to increase GERD medication. Cough was resolved by day 2 of his admission.  Follow up with PCP recommendations Restart alectinib on 09/25/2023 Ensure ortho follow-up  Consider further evaluation for orthostatic hypotension/BPPV (dizziness with standing)    Results/Tests Pending at Time of Discharge:  Unresulted Labs (From admission, onward)    None        Disposition: SNF  Discharge Condition: Stable  Discharge Exam:  Vitals:   09/23/23 0503 09/23/23 0740  BP: 130/75 135/71  Pulse: 81 93  Resp: 17 17  Temp: 97.7 F (36.5 C) 97.8 F (36.6 C)  SpO2: 99% 100%   General: comfortable appearing, NAD Cardiovascular: RRR, no m/r/g Respiratory: normal work of breathing on RA  Significant Procedures: none  Significant Labs and Imaging:  No results for input(s): WBC, HGB, HCT, PLT in the last 48 hours.  No results for input(s): NA, K, CL,  CO2, GLUCOSE, BUN, CREATININE, CALCIUM , MG, PHOS, ALKPHOS, AST, ALT, ALBUMIN , PROTEIN in the last 48 hours.  CT Head Wo Contrast Result Date: 09/14/2023 IMPRESSION: Atrophy, chronic microvascular disease. No acute intracranial abnormality. Old left occipital infarct.   DG Shoulder Left Result Date: 09/14/2023 IMPRESSION: Left AC joint osteoarthritis. No acute bony abnormality.    DG Humerus Left Result Date: 09/14/2023 IMPRESSION: Left AC joint osteoarthritis. No acute bony abnormality.   MRI Shoulder: Radiologist Impression:  1. Large complex intramuscular fluid collection in the subscapularis muscle measuring 5.1 x 3 x 9 cm concerning for a large intramuscular hematoma. 2. Severe supraspinatus tendinosis with a high-grade partial-thickness bursal surface tear along the anterior insertion measuring 9 mm AP, 7.5 mm medial-lateral and a high-grade partial-thickness articular surface tear just proximal to the insertion measuring 6 mm medial-lateral and 6 mm AP. 3. Moderate-severe infraspinatus tendinosis. 4. Large full-thickness tear of the subscapularis tendon at the musculotendinous junction. 5. Mild tendinosis of the intra-articular portion of the long head of the biceps tendon. 6. Moderate osteoarthritis of the glenohumeral joint.  Discharge Medications:  Allergies as of 09/23/2023       Reactions   Ace Inhibitors Cough   Iohexol     Severe ATN after CT with contrast May 2023 - creatinine peaked near 8. Did not require HD.        Medication List     PAUSE taking these medications    Alecensa  150 MG capsule Wait to take this until: September 25, 2023 Generic drug: alectinib Take 4 capsules (600 mg total) by mouth 2 (two) times daily with a meal.       TAKE these medications    baclofen  10 MG tablet Commonly known as: LIORESAL  Take 1 tablet (10 mg total) by mouth 2 (two) times daily as needed for muscle spasms.   busPIRone  5 MG  tablet Commonly known as: BUSPAR  Take 1 tablet (5 mg total) by mouth 2 (two) times daily.   clopidogrel  75 MG tablet Commonly known as: PLAVIX  Take 1 tablet (75 mg total) by mouth daily with breakfast.   diclofenac  Sodium 1 % Gel Commonly known as: VOLTAREN  Apply 2 g topically as needed.   enoxaparin  80 MG/0.8ML injection Commonly known as: LOVENOX  INJECT 0.7 ML (70 MG) INTO THE SKIN EVERY 12 HOURS   Fusion Plus Caps Take 1 capsule by mouth once daily   oxyCODONE  5 MG immediate release tablet Commonly known as: Oxy IR/ROXICODONE  Take 0.5-1 tablets (2.5-5 mg total) by mouth every 6 (six) hours as needed for severe pain (pain score 7-10), moderate pain (pain score 4-6) or breakthrough pain (2.5mg  for moderate to severe pain, 5mg  for breakthrough pain).   pantoprazole  40 MG tablet Commonly known as: PROTONIX  Take 1 tablet (40 mg total) by mouth 2 (two) times daily. What changed: when to take this   predniSONE  10 MG tablet Commonly known as: DELTASONE  Take 1 tablet (10 mg total) by mouth daily with breakfast.   rosuvastatin  5 MG tablet Commonly known as: CRESTOR  Take 1 tablet by mouth once daily   sodium chloride  0.65 % Soln nasal spray Commonly known as: OCEAN Place 1 spray into both nostrils as needed for congestion.        Discharge Instructions: Please refer to Patient Instructions section of EMR for full details.  Patient was counseled important signs and symptoms that should prompt  return to medical care, changes in medications, dietary instructions, activity restrictions, and follow up appointments.   Follow-Up Appointments:  Contact information for after-discharge care     Destination     [facility], [place] .   Service: Skilled Nursing Contact information: [address] [place]  [address] [phone]                    Hospital f/u scheduled with PCP in [facility] 10/07/23, Oncology 9/8 and 9/17  Idelle Nakai, DO 09/23/2023, 8:27 AM PGY-1, [facility]   Upper Level Addendum: I have seen and evaluated this patient along with Dr. Idelle and reviewed the above note, making necessary revisions as appropriate. I agree with the medical decision making and physical exam as noted above. Elyce Prescott, DO PGY-2 [facility] Family Medicine Residency

## 2023-09-19 NOTE — Assessment & Plan Note (Signed)
 T2DM: A1c 4.9, not currently on therapy.  Will monitor with daily labs. HLD: Continue home rosuvastatin  5 mg daily GERD: Continue home pantoprazole  40 mg daily Anxiety: Continue BuSpar  5 mg daily CAD: Continue Plavix  75 mg daily Stage IV lung cancer: pause alectinib 600 mg for 1 week for appetite

## 2023-09-19 NOTE — Assessment & Plan Note (Addendum)
 Stable.  Chronic in left LE - Continue treatment dose Lovenox 

## 2023-09-20 DIAGNOSIS — E440 Moderate protein-calorie malnutrition: Secondary | ICD-10-CM | POA: Insufficient documentation

## 2023-09-20 DIAGNOSIS — M75102 Unspecified rotator cuff tear or rupture of left shoulder, not specified as traumatic: Secondary | ICD-10-CM | POA: Diagnosis not present

## 2023-09-20 MED ORDER — OXYCODONE HCL 5 MG PO TABS
2.5000 mg | ORAL_TABLET | Freq: Four times a day (QID) | ORAL | Status: DC | PRN
  Administered 2023-09-20 – 2023-09-21 (×2): 5 mg via ORAL
  Filled 2023-09-20 (×2): qty 1

## 2023-09-20 NOTE — Assessment & Plan Note (Addendum)
 Improving. - pause alectinib for 1 week (restart 08/28) - day 3/7 medrol  dosepak per oncology - [name] daughter bringing food from home  - Encouraging p.o. water  intake - RD consulted - feeding supplementation between meals BID

## 2023-09-20 NOTE — Progress Notes (Signed)
 Daily Progress Note Intern Pager: [phone]  Patient name: Arthur [name] Medical record number: [id number] Date of birth: Dec 12, 1940 Age: 83 y.o. Gender: male  Primary Care Provider: Stoney Blizzard, DO Consultants: Kemp RANA, oncology Code Status: Full  Pt Overview and Major Events to Date:  08/17: Admitted  Medical Decision Making:  Arthur [name] is a 83 y.o. male admitted for pain control of left shoulder, MRI demonstrating multiple rotator cuff tears, with orthopedic evaluation recommendation for nonoperative management.  Hospital stay further complicated by known hemolytic anemia secondary to chemotherapy requiring transfusion.  Now medically stable awaiting discharge to SNF.   Pertinent PMH/PSH includes lung cancer s/p chemotherapy, hemolytic anemia, DVT.  Assessment & Plan Left shoulder pain Hematoma Nontraumatic complete tear of left rotator cuff Shoulder pain improved today. - Pain regimen: Tylenol  1g every 6 hour scheduled, space oxycodone  2.5-5 mg every 6 hours as needed, lidocaine  patch - heat pack as needed - Diclofenac  gel 1% 2 g 4 times daily as needed for shoulder pain - PT/OT  - Sling as needed - Encourage range of motion exercises Decreased oral intake Improving. - pause alectinib for 1 week (restart 08/28) - day 3/7 medrol  dosepak per oncology - [name] daughter bringing food from home  - Encouraging p.o. water  intake - RD consulted - feeding supplementation between meals BID Deep venous embolism and thrombosis (HCC) Stable.  Chronic in left LE - Continue treatment dose Lovenox  Orthostatic hypotension Stable.  - Encouraging p.o. water  intake - PT Anemia Stable.  S/p 1U transfusion. - Continue home prednisone  10 mg daily Chronic health problem T2DM: A1c 4.9, not currently on therapy. HLD: Continue home rosuvastatin  5 mg daily GERD: Continue home pantoprazole  40 mg daily Anxiety: Continue BuSpar  5 mg daily CAD: Continue Plavix  75 mg  daily Stage IV lung cancer: pause alectinib 600 mg for 1 week for appetite, zofran  prn  FEN/GI: Vegetarian PPx: Treatment dose Lovenox , IVC filter Dispo: SNF pending authorization  Subjective:  NAEO. Patient reports shoulder pain has improved. No concerns this morning.  Objective: Temp:  [97.7 F (36.5 C)-98.3 F (36.8 C)] 98 F (36.7 C) (08/22 2024) Pulse Rate:  [79-100] 92 (08/22 2024) Resp:  [17-19] 17 (08/22 2024) BP: (114-126)/(55-68) 121/55 (08/22 2024) SpO2:  [99 %-100 %] 100 % (08/22 2024) Physical Exam: General: NAD, resting comfortably in hospital bed Neuro: A&O Cardiovascular: RRR, no murmurs,  Respiratory: normal WOB on RA, CTAB, no wheezes, ronchi or rales Abdomen: soft, NTTP, no rebound or guarding Extremities: Moving all 4 extremities equally,   Laboratory: Most recent CBC Lab Results  Component Value Date   WBC 4.3 09/18/2023   HGB 9.9 (L) 09/18/2023   HCT 28.7 (L) 09/18/2023   MCV 81.5 09/18/2023   PLT 402 (H) 09/18/2023   Most recent BMP    Latest Ref Rng & Units 09/18/2023    5:04 AM  BMP  Glucose 70 - 99 mg/dL 886   BUN 8 - 23 mg/dL 15   Creatinine 9.38 - 1.24 mg/dL 8.86   Sodium 864 - 854 mmol/L 138   Potassium 3.5 - 5.1 mmol/L 4.3   Chloride 98 - 111 mmol/L 101   CO2 22 - 32 mmol/L 27   Calcium  8.9 - 10.3 mg/dL 7.8     Imaging/Diagnostic Tests: No new imaging.  Alba Sharper, MD 09/20/2023, 12:38 AM  PGY-3, [facility] Family Medicine FPTS Intern pager: [phone], text pages welcome Secure chat group [facility] Teaching Service

## 2023-09-20 NOTE — Assessment & Plan Note (Addendum)
 Shoulder pain improved today. - Pain regimen: Tylenol  1g every 6 hour scheduled, space oxycodone  2.5-5 mg every 6 hours as needed, lidocaine  patch - heat pack as needed - Diclofenac  gel 1% 2 g 4 times daily as needed for shoulder pain - PT/OT  - Sling as needed - Encourage range of motion exercises

## 2023-09-20 NOTE — Plan of Care (Signed)

## 2023-09-20 NOTE — Plan of Care (Signed)
 Shift goals - Up to chair for meals. Pain control, oral hydration bath and bed change,    Problem: Education: Goal: Knowledge of General Education information will improve Description: Including pain rating scale, medication(s)/side effects and non-pharmacologic comfort measures 09/20/2023 1630 by Christyl Osentoski M, RN Outcome: Progressing 09/20/2023 1630 by Rayleen Waddell HERO, RN Outcome: Progressing   Problem: Health Behavior/Discharge Planning: Goal: Ability to manage health-related needs will improve 09/20/2023 1630 by Rayleen Waddell HERO, RN Outcome: Progressing 09/20/2023 1630 by Rayleen Waddell HERO, RN Outcome: Progressing   Problem: Clinical Measurements: Goal: Ability to maintain clinical measurements within normal limits will improve 09/20/2023 1630 by Rayleen Waddell HERO, RN Outcome: Progressing 09/20/2023 1630 by Rayleen Waddell HERO, RN Outcome: Progressing Goal: Will remain free from infection 09/20/2023 1630 by Rayleen Waddell HERO, RN Outcome: Progressing 09/20/2023 1630 by Rayleen Waddell HERO, RN Outcome: Progressing Goal: Diagnostic test results will improve 09/20/2023 1630 by Rayleen Waddell HERO, RN Outcome: Progressing 09/20/2023 1630 by Rayleen Waddell HERO, RN Outcome: Progressing Goal: Respiratory complications will improve 09/20/2023 1630 by Rayleen Waddell HERO, RN Outcome: Progressing 09/20/2023 1630 by Rayleen Waddell HERO, RN Outcome: Progressing Goal: Cardiovascular complication will be avoided 09/20/2023 1630 by Rayleen Waddell HERO, RN Outcome: Progressing 09/20/2023 1630 by Rayleen Waddell HERO, RN Outcome: Progressing   Problem: Activity: Goal: Risk for activity intolerance will decrease 09/20/2023 1630 by Rayleen Waddell HERO, RN Outcome: Progressing 09/20/2023 1630 by Rayleen Waddell HERO, RN Outcome: Progressing   Problem: Nutrition: Goal: Adequate nutrition will be maintained 09/20/2023 1630 by Rayleen Waddell HERO, RN Outcome: Progressing 09/20/2023 1630 by Rayleen Waddell HERO, RN Outcome:  Progressing   Problem: Coping: Goal: Level of anxiety will decrease 09/20/2023 1630 by Rayleen Waddell HERO, RN Outcome: Progressing 09/20/2023 1630 by Rayleen Waddell HERO, RN Outcome: Progressing   Problem: Elimination: Goal: Will not experience complications related to bowel motility 09/20/2023 1630 by Rayleen Waddell HERO, RN Outcome: Progressing 09/20/2023 1630 by Rayleen Waddell HERO, RN Outcome: Progressing Goal: Will not experience complications related to urinary retention 09/20/2023 1630 by Rayleen Waddell HERO, RN Outcome: Progressing 09/20/2023 1630 by Rayleen Waddell HERO, RN Outcome: Progressing   Problem: Pain Managment: Goal: General experience of comfort will improve and/or be controlled 09/20/2023 1630 by Rayleen Waddell HERO, RN Outcome: Progressing 09/20/2023 1630 by Rayleen Waddell HERO, RN Outcome: Progressing   Problem: Safety: Goal: Ability to remain free from injury will improve 09/20/2023 1630 by Rayleen Waddell HERO, RN Outcome: Progressing 09/20/2023 1630 by Rayleen Waddell HERO, RN Outcome: Progressing   Problem: Skin Integrity: Goal: Risk for impaired skin integrity will decrease 09/20/2023 1630 by Rayleen Waddell HERO, RN Outcome: Progressing 09/20/2023 1630 by Rayleen Waddell HERO, RN Outcome: Progressing

## 2023-09-20 NOTE — Assessment & Plan Note (Addendum)
 T2DM: A1c 4.9, not currently on therapy. HLD: Continue home rosuvastatin  5 mg daily GERD: Continue home pantoprazole  40 mg daily Anxiety: Continue BuSpar  5 mg daily CAD: Continue Plavix  75 mg daily Stage IV lung cancer: pause alectinib 600 mg for 1 week for appetite, zofran  prn

## 2023-09-20 NOTE — Assessment & Plan Note (Signed)
 Stable.  S/p 1U transfusion. - Continue home prednisone  10 mg daily

## 2023-09-20 NOTE — Assessment & Plan Note (Signed)
 Stable.  Chronic in left LE - Continue treatment dose Lovenox 

## 2023-09-20 NOTE — Assessment & Plan Note (Signed)
 Stable.  - Encouraging p.o. water  intake - PT

## 2023-09-20 NOTE — Assessment & Plan Note (Deleted)
 Improved. Denies any emesis -Zofran  4 mg ODT 30 minutes before meals

## 2023-09-21 DIAGNOSIS — M75102 Unspecified rotator cuff tear or rupture of left shoulder, not specified as traumatic: Secondary | ICD-10-CM | POA: Diagnosis not present

## 2023-09-21 NOTE — Plan of Care (Signed)
  Problem: Activity: Goal: Risk for activity intolerance will decrease Outcome: Progressing   Problem: Health Behavior/Discharge Planning: Goal: Ability to manage health-related needs will improve Outcome: Progressing   

## 2023-09-21 NOTE — Assessment & Plan Note (Addendum)
 Pain improved, has nearly 90 degrees abduction, significantly improved range of motion-anticipate he will do well with rehab at SNF. - Pain regimen: Tylenol  1g every 6 hour scheduled, space oxycodone  2.5-5 mg every 6 hours as needed (if not needing this medication today, plan to discontinue altogether), lidocaine  patch - heat pack as needed - Diclofenac  gel 1% 2 g 4 times daily as needed for shoulder pain - PT/OT  - Encourage range of motion exercises

## 2023-09-21 NOTE — Progress Notes (Signed)
 Daily Progress Note Intern Pager: [phone]  Patient name: Arthur [name] Medical record number: [id number] Date of birth: 04-18-1940 Age: 83 y.o. Gender: male  Primary Care Provider: Stoney Blizzard, DO Consultants: Oncology, Ortho (signed off) Code Status: Full  Pt Overview and Major Events to Date:  8/17: Admitted  Assessment and Plan: Arthur [name] is a 83 y.o. male admitted for pain control of left shoulder, MRI demonstrating multiple rotator cuff tears, with orthopedic evaluation recommendation for nonoperative management.  Hospital stay further complicated by known hemolytic anemia secondary to chemotherapy requiring transfusion.  Now medically stable, awaiting discharge to SNF.    Pertinent PMH/PSH includes lung cancer s/p chemotherapy, hemolytic anemia, DVT.  Assessment & Plan Left shoulder pain Hematoma Nontraumatic complete tear of left rotator cuff Pain improved, has nearly 90 degrees abduction, significantly improved range of motion-anticipate he will do well with rehab at SNF. - Pain regimen: Tylenol  1g every 6 hour scheduled, space oxycodone  2.5-5 mg every 6 hours as needed (if not needing this medication today, plan to discontinue altogether), lidocaine  patch - heat pack as needed - Diclofenac  gel 1% 2 g 4 times daily as needed for shoulder pain - PT/OT  - Encourage range of motion exercises Decreased oral intake Last 2 documented meals, 75% / 100% eaten. - pause alectinib for 1 week (restart 08/28) - medrol  dosepak (8/22 - 8/26) per oncology - [name] daughter bringing food from home  - Encouraging p.o. water  intake - RD consulted - feeding supplementation between meals BID Deep venous embolism and thrombosis (HCC) Stable. - Continue treatment dose Lovenox  Orthostatic hypotension Stable.  - Encouraging p.o. water  intake - PT Anemia Stable.  S/p 1U transfusion. - Continue home prednisone  10 mg daily (chronic hemolytic anemia) Chronic health  problem T2DM: A1c 4.9, not currently on therapy. HLD: Continue home rosuvastatin  5 mg daily GERD: Continue home pantoprazole  40 mg daily Anxiety: Continue BuSpar  5 mg daily CAD: Continue Plavix  75 mg daily Stage IV lung cancer: pause alectinib 600 mg for 1 week for appetite, zofran  prn  FEN/GI: Vegetarian PPx: lovenox  Dispo:Pending SNF authorization.   Subjective:  Arthur [name] is in good spirits, he was very happy to show me his improved range of motion of left shoulder after working with physical therapy.  He is not currently in pain, he is waiting for breakfast.  Objective: Temp:  [97.8 F (36.6 C)-98.4 F (36.9 C)] 98 F (36.7 C) (08/24 0728) Pulse Rate:  [72-90] 90 (08/24 0728) Resp:  [16] 16 (08/24 0410) BP: (120-133)/(63-79) 120/63 (08/24 0728) SpO2:  [98 %-100 %] 100 % (08/24 0728) Physical Exam: General: Resting comfortably in bed, NAD Cardiovascular: RRR, no murmurs Respiratory: CTAB, normal work of breathing on room air Extremities: Moving all 4 extremities, no edema.  Improved range of motion of left shoulder.  Laboratory: Most recent CBC Lab Results  Component Value Date   WBC 4.3 09/18/2023   HGB 9.9 (L) 09/18/2023   HCT 28.7 (L) 09/18/2023   MCV 81.5 09/18/2023   PLT 402 (H) 09/18/2023   Most recent BMP    Latest Ref Rng & Units 09/18/2023    5:04 AM  BMP  Glucose 70 - 99 mg/dL 886   BUN 8 - 23 mg/dL 15   Creatinine 9.38 - 1.24 mg/dL 8.86   Sodium 864 - 854 mmol/L 138   Potassium 3.5 - 5.1 mmol/L 4.3   Chloride 98 - 111 mmol/L 101   CO2 22 - 32 mmol/L 27  Calcium  8.9 - 10.3 mg/dL 7.8    Howell Lunger, DO 09/21/2023, 9:18 AM  PGY-3, [facility] Family Medicine FPTS Intern pager: [phone], text pages welcome Secure chat group [facility] Teaching Service

## 2023-09-21 NOTE — Assessment & Plan Note (Signed)
 Stable.  - Encouraging p.o. water  intake - PT

## 2023-09-21 NOTE — Assessment & Plan Note (Addendum)
 Last 2 documented meals, 75% / 100% eaten. - pause alectinib for 1 week (restart 08/28) - medrol  dosepak (8/22 - 8/26) per oncology - [name] daughter bringing food from home  - Encouraging p.o. water  intake - RD consulted - feeding supplementation between meals BID

## 2023-09-21 NOTE — Assessment & Plan Note (Addendum)
 Stable. - Continue treatment dose Lovenox 

## 2023-09-21 NOTE — Assessment & Plan Note (Signed)
 T2DM: A1c 4.9, not currently on therapy. HLD: Continue home rosuvastatin  5 mg daily GERD: Continue home pantoprazole  40 mg daily Anxiety: Continue BuSpar  5 mg daily CAD: Continue Plavix  75 mg daily Stage IV lung cancer: pause alectinib 600 mg for 1 week for appetite, zofran  prn

## 2023-09-21 NOTE — Assessment & Plan Note (Addendum)
 Stable.  S/p 1U transfusion. - Continue home prednisone  10 mg daily (chronic hemolytic anemia)

## 2023-09-22 DIAGNOSIS — M75102 Unspecified rotator cuff tear or rupture of left shoulder, not specified as traumatic: Secondary | ICD-10-CM | POA: Diagnosis not present

## 2023-09-22 NOTE — Plan of Care (Signed)

## 2023-09-22 NOTE — Progress Notes (Signed)
 Physical Therapy Treatment Patient Details Name: Arthur [name] MRN: [id number] DOB: 1940/07/05 Today's Date: 09/22/2023   History of Present Illness Pt is an 83 y/o M presenting to ED with L shoulder pain x3 days and slurred speech. MRI with rotator cuff tears, tendinosis, and large subscapular hematoma in the setting of chronic anticoagulation. Ortho consulted and recommending conservating mgmt. PMH includes DM, high cholesterol, HTN, lung CA    PT Comments  Pt seen for PT tx with pt agreeable, wife present for session. Pt requires cuing to scoot out to edge of seat, hand placement & assistance to power up to standing. Pt ambulates in room & hallway with encouragement to increase gait distances. Pt is limited by overall fatigue, declined further gait after 2 trials. Recommend ongoing PT services to progress mobility as able.    If plan is discharge home, recommend the following: A lot of help with walking and/or transfers;Two people to help with walking and/or transfers;A lot of help with bathing/dressing/bathroom   Can travel by private vehicle     No  Equipment Recommendations  Other (comment) (defer to next venue)    Recommendations for Other Services       Precautions / Restrictions Precautions Precautions: Fall;Other (comment) Precaution/Restrictions Comments: LUE relative rest per ortho, sling for comfort Required Braces or Orthoses: Sling Restrictions Weight Bearing Restrictions Per Provider Order: No Other Position/Activity Restrictions: LUE relative rest; sling for comfort     Mobility  Bed Mobility               General bed mobility comments: not tested, pt received & left sitting in recliner    Transfers Overall transfer level: Needs assistance Equipment used: Rolling walker (2 wheels) Transfers: Sit to/from Stand Sit to Stand: Mod assist           General transfer comment: cuing re: need to scoot out to edge of seat, push up to standing with  RUE, mod assist to power up from recliner & EOB    Ambulation/Gait Ambulation/Gait assistance: Min assist Gait Distance (Feet):  (10 + 25 ft) Assistive device: Rolling walker (2 wheels) Gait Pattern/deviations: Decreased step length - left, Decreased step length - right, Decreased dorsiflexion - left, Decreased stride length Gait velocity: decreased     General Gait Details: decreased L hip/knee flexion during swing phase, decreased L dorsiflexion during swing phase, decreased LLE heel strike, cuing for increased L hip/knee flexion for compensatory movement vs sliding L foot on floor when advancing it with fair return demo. Pt frequently attempting to move L hand to front bar on walker with cuing to place back on handle.   Stairs             Wheelchair Mobility     Tilt Bed    Modified Rankin (Stroke Patients Only)       Balance Overall balance assessment: Needs assistance Sitting-balance support: Feet supported Sitting balance-Leahy Scale: Fair     Standing balance support: During functional activity, Bilateral upper extremity supported, Reliant on assistive device for balance Standing balance-Leahy Scale: Fair                              Musician Communication: Impaired Factors Affecting Communication: Difficulty expressing self  Cognition Arousal: Alert Behavior During Therapy: WFL for tasks assessed/performed   PT - Cognitive impairments: Memory  Following commands: Intact Following commands impaired: Follows one step commands with increased time, Follows multi-step commands with increased time    Cueing Cueing Techniques: Verbal cues, Tactile cues  Exercises      General Comments General comments (skin integrity, edema, etc.): BLE teds donned upon PT arrival      Pertinent Vitals/Pain Pain Assessment Pain Assessment: Faces Faces Pain Scale: Hurts a little bit Pain Location: L  shoulder Pain Descriptors / Indicators: Guarding, Discomfort Pain Intervention(s): Monitored during session, Limited activity within patient's tolerance    Home Living                          Prior Function            PT Goals (current goals can now be found in the care plan section) Acute Rehab PT Goals Patient Stated Goal: Less pain in shoulder so he can do more PT Goal Formulation: With patient Time For Goal Achievement: 09/28/23 Potential to Achieve Goals: Good Progress towards PT goals: Progressing toward goals    Frequency    Min 2X/week      PT Plan      Co-evaluation              AM-PAC PT 6 Clicks Mobility   Outcome Measure  Help needed turning from your back to your side while in a flat bed without using bedrails?: A Little Help needed moving from lying on your back to sitting on the side of a flat bed without using bedrails?: A Little Help needed moving to and from a bed to a chair (including a wheelchair)?: A Lot Help needed standing up from a chair using your arms (e.g., wheelchair or bedside chair)?: A Lot Help needed to walk in hospital room?: A Little Help needed climbing 3-5 steps with a railing? : Total 6 Click Score: 14    End of Session Equipment Utilized During Treatment: Gait belt Activity Tolerance: Patient limited by fatigue Patient left: in chair;with call bell/phone within reach;with family/visitor present   PT Visit Diagnosis: Unsteadiness on feet (R26.81);Other abnormalities of gait and mobility (R26.89);Pain;Muscle weakness (generalized) (M62.81);Hemiplegia and hemiparesis;Difficulty in walking, not elsewhere classified (R26.2) Hemiplegia - Right/Left: Left Hemiplegia - dominant/non-dominant: Non-dominant Hemiplegia - caused by: Cerebral infarction Pain - Right/Left: Left Pain - part of body: Shoulder     Time: 8760-8748 PT Time Calculation (min) (ACUTE ONLY): 12 min  Charges:    $Therapeutic Activity: 8-22  mins PT General Charges $$ ACUTE PT VISIT: 1 Visit                     Richerd Pinal, PT, DPT 09/22/23, 12:58 PM   Richerd CHRISTELLA Pinal 09/22/2023, 12:57 PM

## 2023-09-22 NOTE — Assessment & Plan Note (Addendum)
 Pain improved, significantly improved range of motion-anticipate he will do well with rehab at SNF. - Pain regimen: Tylenol  1g every 6 hour scheduled, space oxycodone  2.5-5 mg every 6 hours as needed, lidocaine  patch, heat pack as needed, Diclofenac  gel 1% 2 g 4 times daily as needed for shoulder pain - PT/OT  - Encourage range of motion exercises

## 2023-09-22 NOTE — Assessment & Plan Note (Signed)
 T2DM: A1c 4.9, not currently on therapy. HLD: Continue home rosuvastatin  5 mg daily GERD: Continue home pantoprazole  40 mg daily Anxiety: Continue BuSpar  5 mg daily CAD: Continue Plavix  75 mg daily Stage IV lung cancer: pause alectinib 600 mg for 1 week for appetite, zofran  prn

## 2023-09-22 NOTE — Assessment & Plan Note (Addendum)
 Resolving. Last 2 documented meals, 75% / 100% eaten. - pause alectinib for 1 week (restart 08/28) - medrol  dosepak (8/22 - 8/26) per oncology - [name] daughter bringing food from home  - Encouraging p.o. water  intake - RD consulted - feeding supplementation between meals BID

## 2023-09-22 NOTE — Progress Notes (Signed)
 Daily Progress Note Intern Pager: [phone]  Patient name: Arthur [name] Medical record number: [id number] Date of birth: 17-Sep-1940 Age: 83 y.o. Gender: male  Primary Care Provider: Stoney Blizzard, DO Consultants: Oncology, Ortho (s/o)  Code Status: full  Pt Overview and Major Events to Date:  8/17: Admitted   Assessment and Plan: Arthur [name] is a 83 y.o. male admitted for pain control of left shoulder. MRI demonstrating multiple rotator cuff tears, with orthopedic evaluation recommendation for nonoperative management.  His hospital stay was further complicated by known hemolytic anemia secondary to chemotherapy requiring transfusion. He is now medically stable, awaiting discharge to SNF.   Pertinent PMH/PSH includes primary adenocarcinoma of the lung s/p chemotherapy, chronic DVT, and multifactorial anemia.  Assessment & Plan Left shoulder pain Hematoma Nontraumatic complete tear of left rotator cuff Pain improved, significantly improved range of motion-anticipate he will do well with rehab at SNF. - Pain regimen: Tylenol  1g every 6 hour scheduled, space oxycodone  2.5-5 mg every 6 hours as needed, lidocaine  patch, heat pack as needed, Diclofenac  gel 1% 2 g 4 times daily as needed for shoulder pain - PT/OT  - Encourage range of motion exercises Decreased oral intake Resolving. Last 2 documented meals, 75% / 100% eaten. - pause alectinib for 1 week (restart 08/28) - medrol  dosepak (8/22 - 8/26) per oncology - [name] daughter bringing food from home  - Encouraging p.o. water  intake - RD consulted - feeding supplementation between meals BID Deep venous embolism and thrombosis (HCC) Stable. Wearing compression socks today - Continue treatment dose Lovenox  Anemia Stable. S/p 1U transfusion. - Continue home prednisone  10 mg daily (chronic hemolytic anemia) - repeat CBC if still pending dispo Orthostatic hypotension (Resolved: 09/22/2023) Stable.  - Encouraging  p.o. water  intake - PT Chronic health problem T2DM: A1c 4.9, not currently on therapy. HLD: Continue home rosuvastatin  5 mg daily GERD: Continue home pantoprazole  40 mg daily Anxiety: Continue BuSpar  5 mg daily CAD: Continue Plavix  75 mg daily Stage IV lung cancer: pause alectinib 600 mg for 1 week for appetite, zofran  prn  FEN/GI: vegetarian PPx: lovenox  Dispo:SNF pending decision   Subjective:  Patient is resting in bed and has no new complaints.  He is able to demonstrate the progress he has made with physical therapy by adducing, abducting and raising his arm and says he has little to no pain with this, except for an occasional twinge.  At this point we are waiting for his daughter-in-law to choose SNF bed placement.  Objective: Temp:  [98 F (36.7 C)-98.2 F (36.8 C)] 98.1 F (36.7 C) (08/24 2200) Pulse Rate:  [90-92] 92 (08/24 2200) Resp:  [16] 16 (08/24 2200) BP: (120-128)/(54-79) 128/54 (08/24 2200) SpO2:  [100 %] 100 % (08/24 0728) Physical Exam: General: Comfortably sleeping in bed, NAD Cardiovascular: RRR Respiratory: Normal work of breathing on room air Abdomen: Soft, nontender, nondistended Extremities: Wearing compression socks around bilateral lower extremities, legs symmetric and warm  Laboratory: Most recent CBC Lab Results  Component Value Date   WBC 4.3 09/18/2023   HGB 9.9 (L) 09/18/2023   HCT 28.7 (L) 09/18/2023   MCV 81.5 09/18/2023   PLT 402 (H) 09/18/2023   Most recent BMP    Latest Ref Rng & Units 09/18/2023    5:04 AM  BMP  Glucose 70 - 99 mg/dL 886   BUN 8 - 23 mg/dL 15   Creatinine 9.38 - 1.24 mg/dL 8.86   Sodium 864 - 854 mmol/L 138  Potassium 3.5 - 5.1 mmol/L 4.3   Chloride 98 - 111 mmol/L 101   CO2 22 - 32 mmol/L 27   Calcium  8.9 - 10.3 mg/dL 7.8    Idelle Nakai, DO 09/22/2023, 7:13 AM  PGY-1, [facility] Health Family Medicine FPTS Intern pager: [phone], text pages welcome Secure chat group [facility] Teaching  Service

## 2023-09-22 NOTE — Assessment & Plan Note (Signed)
 Stable.  - Encouraging p.o. water  intake - PT

## 2023-09-22 NOTE — TOC Progression Note (Signed)
 Transition of Care [facility]) - Progression Note    Patient Details  Name: Arthur [name] MRN: [id number] Date of Birth: 09/26/1940  [facility] CM/SW Contact  Lauraine FORBES Saa, LCSWA Phone Number: 09/22/2023, 1:29 PM  Clinical Narrative:     1:29 PM CSW returned patient's daughter in law, [name]'s, two voicemails left to discuss SNF decision. [name] informed CSW of bed decision [facility]) and stated that she attempted to follow up with bedside RN over the weekend on SNF decision but was told that CSW's do no work weekends. CSW submitted SNF insurance authorization which was automatically approved and is valid 09/23/2023-09/25/2023. CSW informed SNF of bed acceptance and insurance authorization approval. [facility] SNF admissions confirmed they could admit patient tomorrow. CSW relayed above information to medical team.  Expected Discharge Plan: Skilled Nursing Facility Barriers to Discharge: Other (must enter comment), English as a second language teacher, Continued Medical Work up (SNF pending bed decision)               Expected Discharge Plan and Services In-house Referral: Clinical Social Work   Post Acute Care Choice: Skilled Nursing Facility Living arrangements for the past 2 months: Single Family Home                                       Social Drivers of Health (SDOH) Interventions SDOH Screenings   Food Insecurity: No Food Insecurity (09/14/2023)  Housing: Low Risk  (09/15/2023)  Transportation Needs: No Transportation Needs (09/14/2023)  Utilities: Not At Risk (09/14/2023)  Alcohol Screen: Low Risk  (10/28/2022)  Depression (PHQ2-9): Low Risk  (06/25/2023)  Financial Resource Strain: Low Risk  (10/28/2022)  Physical Activity: Insufficiently Active (10/28/2022)  Social Connections: Unknown (09/15/2023)  Stress: No Stress Concern Present (10/28/2022)  Tobacco Use: Low Risk  (09/14/2023)  Health Literacy: Adequate Health Literacy (10/28/2022)    Readmission  Risk Interventions    06/19/2021    4:57 PM  Readmission Risk Prevention Plan  Transportation Screening Complete  PCP or Specialist Appt within 3-5 Days Complete  HRI or Home Care Consult Complete  Social Work Consult for Recovery Care Planning/Counseling Complete  Medication Review Oceanographer) Complete

## 2023-09-22 NOTE — Plan of Care (Signed)
   Problem: Education: Goal: Knowledge of General Education information will improve Description: Including pain rating scale, medication(s)/side effects and non-pharmacologic comfort measures Outcome: Progressing   Problem: Clinical Measurements: Goal: Will remain free from infection Outcome: Progressing

## 2023-09-22 NOTE — Assessment & Plan Note (Addendum)
 Stable. Wearing compression socks today - Continue treatment dose Lovenox 

## 2023-09-22 NOTE — Assessment & Plan Note (Addendum)
 Stable. S/p 1U transfusion. - Continue home prednisone  10 mg daily (chronic hemolytic anemia) - repeat CBC if still pending dispo

## 2023-09-23 DIAGNOSIS — I951 Orthostatic hypotension: Secondary | ICD-10-CM | POA: Diagnosis not present

## 2023-09-23 DIAGNOSIS — M25519 Pain in unspecified shoulder: Secondary | ICD-10-CM | POA: Diagnosis not present

## 2023-09-23 DIAGNOSIS — R64 Cachexia: Secondary | ICD-10-CM | POA: Diagnosis present

## 2023-09-23 DIAGNOSIS — Z0389 Encounter for observation for other suspected diseases and conditions ruled out: Secondary | ICD-10-CM | POA: Diagnosis not present

## 2023-09-23 DIAGNOSIS — Z66 Do not resuscitate: Secondary | ICD-10-CM | POA: Diagnosis present

## 2023-09-23 DIAGNOSIS — R531 Weakness: Secondary | ICD-10-CM | POA: Diagnosis not present

## 2023-09-23 DIAGNOSIS — Z7902 Long term (current) use of antithrombotics/antiplatelets: Secondary | ICD-10-CM | POA: Diagnosis not present

## 2023-09-23 DIAGNOSIS — R0602 Shortness of breath: Secondary | ICD-10-CM | POA: Diagnosis not present

## 2023-09-23 DIAGNOSIS — K861 Other chronic pancreatitis: Secondary | ICD-10-CM | POA: Diagnosis present

## 2023-09-23 DIAGNOSIS — I700 Atherosclerosis of aorta: Secondary | ICD-10-CM | POA: Diagnosis not present

## 2023-09-23 DIAGNOSIS — M109 Gout, unspecified: Secondary | ICD-10-CM | POA: Diagnosis present

## 2023-09-23 DIAGNOSIS — G8929 Other chronic pain: Secondary | ICD-10-CM | POA: Diagnosis present

## 2023-09-23 DIAGNOSIS — Z7189 Other specified counseling: Secondary | ICD-10-CM | POA: Diagnosis not present

## 2023-09-23 DIAGNOSIS — M6281 Muscle weakness (generalized): Secondary | ICD-10-CM | POA: Diagnosis not present

## 2023-09-23 DIAGNOSIS — E440 Moderate protein-calorie malnutrition: Secondary | ICD-10-CM | POA: Diagnosis not present

## 2023-09-23 DIAGNOSIS — Z515 Encounter for palliative care: Secondary | ICD-10-CM | POA: Diagnosis not present

## 2023-09-23 DIAGNOSIS — F419 Anxiety disorder, unspecified: Secondary | ICD-10-CM | POA: Diagnosis present

## 2023-09-23 DIAGNOSIS — E119 Type 2 diabetes mellitus without complications: Secondary | ICD-10-CM | POA: Diagnosis present

## 2023-09-23 DIAGNOSIS — I2510 Atherosclerotic heart disease of native coronary artery without angina pectoris: Secondary | ICD-10-CM | POA: Diagnosis not present

## 2023-09-23 DIAGNOSIS — C3490 Malignant neoplasm of unspecified part of unspecified bronchus or lung: Secondary | ICD-10-CM | POA: Diagnosis present

## 2023-09-23 DIAGNOSIS — I480 Paroxysmal atrial fibrillation: Secondary | ICD-10-CM | POA: Diagnosis present

## 2023-09-23 DIAGNOSIS — J690 Pneumonitis due to inhalation of food and vomit: Secondary | ICD-10-CM | POA: Diagnosis present

## 2023-09-23 DIAGNOSIS — D630 Anemia in neoplastic disease: Secondary | ICD-10-CM | POA: Diagnosis present

## 2023-09-23 DIAGNOSIS — M75122 Complete rotator cuff tear or rupture of left shoulder, not specified as traumatic: Secondary | ICD-10-CM | POA: Diagnosis not present

## 2023-09-23 DIAGNOSIS — I69391 Dysphagia following cerebral infarction: Secondary | ICD-10-CM | POA: Diagnosis not present

## 2023-09-23 DIAGNOSIS — I3139 Other pericardial effusion (noninflammatory): Secondary | ICD-10-CM | POA: Diagnosis not present

## 2023-09-23 DIAGNOSIS — M545 Low back pain, unspecified: Secondary | ICD-10-CM | POA: Diagnosis not present

## 2023-09-23 DIAGNOSIS — M75102 Unspecified rotator cuff tear or rupture of left shoulder, not specified as traumatic: Secondary | ICD-10-CM | POA: Diagnosis not present

## 2023-09-23 DIAGNOSIS — K8020 Calculus of gallbladder without cholecystitis without obstruction: Secondary | ICD-10-CM | POA: Diagnosis not present

## 2023-09-23 DIAGNOSIS — M21372 Foot drop, left foot: Secondary | ICD-10-CM | POA: Diagnosis not present

## 2023-09-23 DIAGNOSIS — I10 Essential (primary) hypertension: Secondary | ICD-10-CM | POA: Diagnosis present

## 2023-09-23 DIAGNOSIS — E7800 Pure hypercholesterolemia, unspecified: Secondary | ICD-10-CM | POA: Diagnosis present

## 2023-09-23 DIAGNOSIS — R1111 Vomiting without nausea: Secondary | ICD-10-CM | POA: Diagnosis not present

## 2023-09-23 DIAGNOSIS — J189 Pneumonia, unspecified organism: Secondary | ICD-10-CM | POA: Diagnosis present

## 2023-09-23 DIAGNOSIS — I69398 Other sequelae of cerebral infarction: Secondary | ICD-10-CM | POA: Diagnosis not present

## 2023-09-23 DIAGNOSIS — I672 Cerebral atherosclerosis: Secondary | ICD-10-CM | POA: Diagnosis not present

## 2023-09-23 DIAGNOSIS — K219 Gastro-esophageal reflux disease without esophagitis: Secondary | ICD-10-CM | POA: Diagnosis present

## 2023-09-23 DIAGNOSIS — Z681 Body mass index (BMI) 19 or less, adult: Secondary | ICD-10-CM | POA: Diagnosis not present

## 2023-09-23 DIAGNOSIS — R638 Other symptoms and signs concerning food and fluid intake: Secondary | ICD-10-CM | POA: Diagnosis not present

## 2023-09-23 DIAGNOSIS — D649 Anemia, unspecified: Secondary | ICD-10-CM | POA: Diagnosis not present

## 2023-09-23 DIAGNOSIS — Z7901 Long term (current) use of anticoagulants: Secondary | ICD-10-CM | POA: Diagnosis not present

## 2023-09-23 DIAGNOSIS — M25512 Pain in left shoulder: Secondary | ICD-10-CM | POA: Diagnosis present

## 2023-09-23 DIAGNOSIS — R1312 Dysphagia, oropharyngeal phase: Secondary | ICD-10-CM | POA: Diagnosis present

## 2023-09-23 DIAGNOSIS — C3492 Malignant neoplasm of unspecified part of left bronchus or lung: Secondary | ICD-10-CM | POA: Diagnosis not present

## 2023-09-23 DIAGNOSIS — G928 Other toxic encephalopathy: Secondary | ICD-10-CM | POA: Diagnosis present

## 2023-09-23 DIAGNOSIS — G9389 Other specified disorders of brain: Secondary | ICD-10-CM | POA: Diagnosis not present

## 2023-09-23 DIAGNOSIS — M19012 Primary osteoarthritis, left shoulder: Secondary | ICD-10-CM | POA: Diagnosis not present

## 2023-09-23 DIAGNOSIS — K8689 Other specified diseases of pancreas: Secondary | ICD-10-CM | POA: Diagnosis not present

## 2023-09-23 DIAGNOSIS — Z86711 Personal history of pulmonary embolism: Secondary | ICD-10-CM | POA: Diagnosis not present

## 2023-09-23 DIAGNOSIS — Z7401 Bed confinement status: Secondary | ICD-10-CM | POA: Diagnosis not present

## 2023-09-23 DIAGNOSIS — R0902 Hypoxemia: Secondary | ICD-10-CM | POA: Diagnosis not present

## 2023-09-23 DIAGNOSIS — Z1152 Encounter for screening for COVID-19: Secondary | ICD-10-CM | POA: Diagnosis not present

## 2023-09-23 DIAGNOSIS — Z743 Need for continuous supervision: Secondary | ICD-10-CM | POA: Diagnosis not present

## 2023-09-23 DIAGNOSIS — R42 Dizziness and giddiness: Secondary | ICD-10-CM | POA: Diagnosis not present

## 2023-09-23 DIAGNOSIS — R2681 Unsteadiness on feet: Secondary | ICD-10-CM | POA: Diagnosis not present

## 2023-09-23 DIAGNOSIS — Z955 Presence of coronary angioplasty implant and graft: Secondary | ICD-10-CM | POA: Diagnosis not present

## 2023-09-23 DIAGNOSIS — K5900 Constipation, unspecified: Secondary | ICD-10-CM | POA: Diagnosis not present

## 2023-09-23 MED ORDER — OXYCODONE HCL 5 MG PO TABS: 2.5000 mg | ORAL_TABLET | Freq: Four times a day (QID) | ORAL | Status: DC | PRN

## 2023-09-23 MED ORDER — PANTOPRAZOLE SODIUM 40 MG PO TBEC: 40.0000 mg | DELAYED_RELEASE_TABLET | Freq: Two times a day (BID) | ORAL | Status: DC

## 2023-09-23 NOTE — Progress Notes (Signed)
 IV removed.  Report called and given to [employer] at [facility].  1315 Patient wheeled off the unit for rehab.

## 2023-09-23 NOTE — TOC Transition Note (Signed)
 Transition of Care [facility]) - Discharge Note   Patient Details  Name: Arthur [name] MRN: [id number] Date of Birth: 1940-12-15  [facility] of [facility]) CM/SW Contact:  Lauraine FORBES Saa, LCSWA Phone Number: 09/23/2023, 11:51 AM   Clinical Narrative:     Patient will DC to: [facility] SNF Anticipated DC date: 09/23/2023 Family notified: Nasri Boakye; Daughter in [place]; [phone] Transport by: [facility]   Per MD patient ready for DC to [facility]. RN to call report prior to discharge [phone]). RN, patient, patient's family, and facility notified of DC. Discharge Summary and FL2 sent to facility. DC packet on chart. Ambulance transport requested for patient at 11:48.   CSW will sign off for now as social work intervention is no longer needed. Please consult us  again if new needs arise.    Final next level of care: Skilled Nursing Facility Barriers to Discharge: Barriers Resolved   Patient Goals and CMS Choice Patient states their goals for this hospitalization and ongoing recovery are:: Family wants pt to go home with home health services          Discharge Placement              Patient chooses bed at: [facility] Patient to be transferred to facility by: [facility] Name of family member notified: Eugen Jeansonne; Daughter in [place]; [phone] Patient and family notified of of transfer: 09/23/23  Discharge Plan and Services Additional resources added to the After Visit Summary for   In-house Referral: Clinical Social Work   Post Acute Care Choice: Skilled Nursing Facility                               Social Drivers of Health (SDOH) Interventions SDOH Screenings   Food Insecurity: No Food Insecurity (09/14/2023)  Housing: Low Risk  (09/15/2023)  Transportation Needs: No Transportation Needs (09/14/2023)  Utilities: Not At Risk (09/14/2023)  Alcohol Screen: Low Risk  (10/28/2022)  Depression (PHQ2-9): Low Risk  (06/25/2023)  Financial Resource Strain: Low  Risk  (10/28/2022)  Physical Activity: Insufficiently Active (10/28/2022)  Social Connections: Unknown (09/15/2023)  Stress: No Stress Concern Present (10/28/2022)  Tobacco Use: Low Risk  (09/14/2023)  Health Literacy: Adequate Health Literacy (10/28/2022)     Readmission Risk Interventions    06/19/2021    4:57 PM  Readmission Risk Prevention Plan  Transportation Screening Complete  PCP or Specialist Appt within 3-5 Days Complete  HRI or Home Care Consult Complete  Social Work Consult for Recovery Care Planning/Counseling Complete  Medication Review Oceanographer) Complete

## 2023-09-23 NOTE — Progress Notes (Signed)
 Occupational Therapy Treatment Patient Details Name: Arthur [name] MRN: [id number] DOB: 04-Mar-1940 Today's Date: 09/23/2023   History of present illness Pt is an 83 y/o M presenting to ED with L shoulder pain x3 days and slurred speech. MRI with rotator cuff tears, tendinosis, and large subscapular hematoma in the setting of chronic anticoagulation. Ortho consulted and recommending conservating mgmt. PMH includes DM, high cholesterol, HTN, lung CA   OT comments  Patient making good gains with OT treatment with min assist to get to EOB and for transfers. Patient performed grooming tasks with setup seated. Patient performed mobility and transfer training with min assist. Patient completed therapy in recliner with setup for breakfast. Patient will benefit from continued inpatient follow up therapy, <3 hours/day.  Acute OT to continue to follow to address established goals to facilitate DC to next venue of care.        If plan is discharge home, recommend the following:  A little help with walking and/or transfers;A lot of help with bathing/dressing/bathroom;A lot of help with walking and/or transfers;Direct supervision/assist for medications management;Direct supervision/assist for financial management;Assist for transportation;Help with stairs or ramp for entrance   Equipment Recommendations  Other (comment) (defer)    Recommendations for Other Services      Precautions / Restrictions Precautions Precautions: Fall;Other (comment) Precaution/Restrictions Comments: LUE relative rest per ortho, sling for comfort Required Braces or Orthoses: Sling Restrictions Weight Bearing Restrictions Per Provider Order: No Other Position/Activity Restrictions: LUE relative rest; sling for comfort       Mobility Bed Mobility Overal bed mobility: Needs Assistance Bed Mobility: Rolling, Sidelying to Sit Rolling: Min assist Sidelying to sit: Min assist       General bed mobility comments: uses  RUE to assist with bed mobility    Transfers Overall transfer level: Needs assistance Equipment used: Rolling walker (2 wheels) Transfers: Sit to/from Stand Sit to Stand: Min assist           General transfer comment: cues for hand placement and min assist to power up and to steady     Balance Overall balance assessment: Needs assistance Sitting-balance support: Feet supported Sitting balance-Leahy Scale: Fair Sitting balance - Comments: static sitting EOB   Standing balance support: During functional activity, Bilateral upper extremity supported, Reliant on assistive device for balance Standing balance-Leahy Scale: Fair Standing balance comment: reliant on external support                           ADL either performed or assessed with clinical judgement   ADL Overall ADL's : Needs assistance/impaired Eating/Feeding: Set up;Sitting   Grooming: Wash/dry hands;Wash/dry face;Oral care;Set up;Sitting           Upper Body Dressing : Minimal assistance;Sitting Upper Body Dressing Details (indicate cue type and reason): to change gown Lower Body Dressing: Moderate assistance   Toilet Transfer: Minimal assistance;Rolling walker (2 wheels) Toilet Transfer Details (indicate cue type and reason): simulated                Extremity/Trunk Assessment              Vision       Perception     Praxis     Communication Communication Communication: Impaired Factors Affecting Communication: Difficulty expressing self   Cognition Arousal: Alert Behavior During Therapy: [facility] for tasks assessed/performed  Following commands: Intact Following commands impaired: Follows one step commands with increased time, Follows multi-step commands with increased time      Cueing   Cueing Techniques: Verbal cues, Tactile cues  Exercises      Shoulder Instructions       General Comments      Pertinent Vitals/  Pain       Pain Assessment Pain Assessment: Faces Faces Pain Scale: Hurts a little bit Pain Location: L shoulder Pain Descriptors / Indicators: Guarding, Discomfort Pain Intervention(s): Limited activity within patient's tolerance, Monitored during session, Repositioned  Home Living                                          Prior Functioning/Environment              Frequency  Min 2X/week        Progress Toward Goals  OT Goals(current goals can now be found in the care plan section)  Progress towards OT goals: Progressing toward goals  Acute Rehab OT Goals Patient Stated Goal: to get more rehab OT Goal Formulation: With patient Time For Goal Achievement: 09/28/23 Potential to Achieve Goals: Good ADL Goals Pt Will Perform Upper Body Dressing: with min assist;sitting Pt Will Perform Lower Body Dressing: with min assist;sitting/lateral leans;sit to/from stand Pt Will Transfer to Toilet: with min assist;ambulating;regular height toilet Pt Will Perform Tub/Shower Transfer: Tub transfer;Shower transfer;with min assist;ambulating;shower seat  Plan      Co-evaluation                 AM-PAC OT 6 Clicks Daily Activity     Outcome Measure   Help from another person eating meals?: A Little Help from another person taking care of personal grooming?: A Little Help from another person toileting, which includes using toliet, bedpan, or urinal?: A Lot Help from another person bathing (including washing, rinsing, drying)?: A Lot Help from another person to put on and taking off regular upper body clothing?: A Lot Help from another person to put on and taking off regular lower body clothing?: A Lot 6 Click Score: 14    End of Session Equipment Utilized During Treatment: Gait belt;Rolling walker (2 wheels)  OT Visit Diagnosis: Unsteadiness on feet (R26.81);Other abnormalities of gait and mobility (R26.89);Muscle weakness (generalized) (M62.81)    Activity Tolerance Patient tolerated treatment well   Patient Left in chair;with call bell/phone within reach;with chair alarm set   Nurse Communication Mobility status        Time: 9177-9156 OT Time Calculation (min): 21 min  Charges: OT General Charges $OT Visit: 1 Visit OT Treatments $Self Care/Home Management : 8-22 mins  Dick Laine, OTA Acute Rehabilitation Services  Office [phone]   Jeb LITTIE Laine 09/23/2023, 11:00 AM

## 2023-09-23 NOTE — Plan of Care (Signed)

## 2023-09-23 NOTE — Plan of Care (Signed)

## 2023-09-24 DIAGNOSIS — M6281 Muscle weakness (generalized): Secondary | ICD-10-CM | POA: Diagnosis not present

## 2023-09-24 DIAGNOSIS — C3490 Malignant neoplasm of unspecified part of unspecified bronchus or lung: Secondary | ICD-10-CM | POA: Diagnosis not present

## 2023-09-24 DIAGNOSIS — D649 Anemia, unspecified: Secondary | ICD-10-CM | POA: Diagnosis not present

## 2023-09-24 DIAGNOSIS — Z7901 Long term (current) use of anticoagulants: Secondary | ICD-10-CM | POA: Diagnosis not present

## 2023-09-24 DIAGNOSIS — M75122 Complete rotator cuff tear or rupture of left shoulder, not specified as traumatic: Secondary | ICD-10-CM | POA: Diagnosis not present

## 2023-09-25 DIAGNOSIS — I480 Paroxysmal atrial fibrillation: Secondary | ICD-10-CM | POA: Diagnosis not present

## 2023-09-25 DIAGNOSIS — Z86711 Personal history of pulmonary embolism: Secondary | ICD-10-CM | POA: Diagnosis not present

## 2023-09-25 DIAGNOSIS — I2510 Atherosclerotic heart disease of native coronary artery without angina pectoris: Secondary | ICD-10-CM | POA: Diagnosis not present

## 2023-09-25 DIAGNOSIS — I951 Orthostatic hypotension: Secondary | ICD-10-CM | POA: Diagnosis not present

## 2023-09-25 DIAGNOSIS — M75122 Complete rotator cuff tear or rupture of left shoulder, not specified as traumatic: Secondary | ICD-10-CM | POA: Diagnosis not present

## 2023-09-25 DIAGNOSIS — M21372 Foot drop, left foot: Secondary | ICD-10-CM | POA: Diagnosis not present

## 2023-09-25 DIAGNOSIS — E440 Moderate protein-calorie malnutrition: Secondary | ICD-10-CM | POA: Diagnosis not present

## 2023-09-25 DIAGNOSIS — D649 Anemia, unspecified: Secondary | ICD-10-CM | POA: Diagnosis not present

## 2023-09-25 DIAGNOSIS — R638 Other symptoms and signs concerning food and fluid intake: Secondary | ICD-10-CM | POA: Diagnosis not present

## 2023-09-25 DIAGNOSIS — I69398 Other sequelae of cerebral infarction: Secondary | ICD-10-CM | POA: Diagnosis not present

## 2023-09-25 DIAGNOSIS — Z7901 Long term (current) use of anticoagulants: Secondary | ICD-10-CM | POA: Diagnosis not present

## 2023-09-25 DIAGNOSIS — Z955 Presence of coronary angioplasty implant and graft: Secondary | ICD-10-CM | POA: Diagnosis not present

## 2023-09-29 DIAGNOSIS — I69354 Hemiplegia and hemiparesis following cerebral infarction affecting left non-dominant side: Secondary | ICD-10-CM | POA: Diagnosis not present

## 2023-09-29 DIAGNOSIS — R2681 Unsteadiness on feet: Secondary | ICD-10-CM | POA: Diagnosis not present

## 2023-09-29 DIAGNOSIS — M545 Low back pain, unspecified: Secondary | ICD-10-CM | POA: Diagnosis not present

## 2023-09-29 DIAGNOSIS — C3492 Malignant neoplasm of unspecified part of left bronchus or lung: Secondary | ICD-10-CM | POA: Diagnosis not present

## 2023-09-29 DIAGNOSIS — E7800 Pure hypercholesterolemia, unspecified: Secondary | ICD-10-CM | POA: Diagnosis present

## 2023-09-29 DIAGNOSIS — M25519 Pain in unspecified shoulder: Secondary | ICD-10-CM | POA: Diagnosis not present

## 2023-09-29 DIAGNOSIS — R42 Dizziness and giddiness: Secondary | ICD-10-CM | POA: Diagnosis not present

## 2023-09-29 DIAGNOSIS — R64 Cachexia: Secondary | ICD-10-CM | POA: Diagnosis present

## 2023-09-29 DIAGNOSIS — I480 Paroxysmal atrial fibrillation: Secondary | ICD-10-CM | POA: Diagnosis present

## 2023-09-29 DIAGNOSIS — I3139 Other pericardial effusion (noninflammatory): Secondary | ICD-10-CM | POA: Diagnosis not present

## 2023-09-29 DIAGNOSIS — I10 Essential (primary) hypertension: Secondary | ICD-10-CM | POA: Diagnosis present

## 2023-09-29 DIAGNOSIS — K219 Gastro-esophageal reflux disease without esophagitis: Secondary | ICD-10-CM | POA: Diagnosis present

## 2023-09-29 DIAGNOSIS — J690 Pneumonitis due to inhalation of food and vomit: Secondary | ICD-10-CM | POA: Diagnosis present

## 2023-09-29 DIAGNOSIS — Z7902 Long term (current) use of antithrombotics/antiplatelets: Secondary | ICD-10-CM | POA: Diagnosis not present

## 2023-09-29 DIAGNOSIS — K8020 Calculus of gallbladder without cholecystitis without obstruction: Secondary | ICD-10-CM | POA: Diagnosis not present

## 2023-09-29 DIAGNOSIS — J189 Pneumonia, unspecified organism: Secondary | ICD-10-CM | POA: Diagnosis present

## 2023-09-29 DIAGNOSIS — I700 Atherosclerosis of aorta: Secondary | ICD-10-CM | POA: Diagnosis not present

## 2023-09-29 DIAGNOSIS — M75122 Complete rotator cuff tear or rupture of left shoulder, not specified as traumatic: Secondary | ICD-10-CM | POA: Diagnosis not present

## 2023-09-29 DIAGNOSIS — K8689 Other specified diseases of pancreas: Secondary | ICD-10-CM | POA: Diagnosis not present

## 2023-09-29 DIAGNOSIS — M109 Gout, unspecified: Secondary | ICD-10-CM | POA: Diagnosis present

## 2023-09-29 DIAGNOSIS — M19012 Primary osteoarthritis, left shoulder: Secondary | ICD-10-CM | POA: Diagnosis not present

## 2023-09-29 DIAGNOSIS — Z0389 Encounter for observation for other suspected diseases and conditions ruled out: Secondary | ICD-10-CM | POA: Diagnosis not present

## 2023-09-29 DIAGNOSIS — Z7901 Long term (current) use of anticoagulants: Secondary | ICD-10-CM | POA: Diagnosis not present

## 2023-09-29 DIAGNOSIS — Z681 Body mass index (BMI) 19 or less, adult: Secondary | ICD-10-CM | POA: Diagnosis not present

## 2023-09-29 DIAGNOSIS — R1111 Vomiting without nausea: Secondary | ICD-10-CM | POA: Diagnosis not present

## 2023-09-29 DIAGNOSIS — M6281 Muscle weakness (generalized): Secondary | ICD-10-CM | POA: Diagnosis not present

## 2023-09-29 DIAGNOSIS — Z515 Encounter for palliative care: Secondary | ICD-10-CM | POA: Diagnosis not present

## 2023-09-29 DIAGNOSIS — M25512 Pain in left shoulder: Secondary | ICD-10-CM | POA: Diagnosis present

## 2023-09-29 DIAGNOSIS — Z7189 Other specified counseling: Secondary | ICD-10-CM | POA: Diagnosis not present

## 2023-09-29 DIAGNOSIS — F419 Anxiety disorder, unspecified: Secondary | ICD-10-CM | POA: Diagnosis present

## 2023-09-29 DIAGNOSIS — I951 Orthostatic hypotension: Secondary | ICD-10-CM | POA: Diagnosis not present

## 2023-09-29 DIAGNOSIS — I69391 Dysphagia following cerebral infarction: Secondary | ICD-10-CM | POA: Diagnosis not present

## 2023-09-29 DIAGNOSIS — E119 Type 2 diabetes mellitus without complications: Secondary | ICD-10-CM | POA: Diagnosis present

## 2023-09-29 DIAGNOSIS — G9389 Other specified disorders of brain: Secondary | ICD-10-CM | POA: Diagnosis not present

## 2023-09-29 DIAGNOSIS — I672 Cerebral atherosclerosis: Secondary | ICD-10-CM | POA: Diagnosis not present

## 2023-09-29 DIAGNOSIS — R0902 Hypoxemia: Secondary | ICD-10-CM | POA: Diagnosis not present

## 2023-09-29 DIAGNOSIS — R1312 Dysphagia, oropharyngeal phase: Secondary | ICD-10-CM | POA: Diagnosis present

## 2023-09-29 DIAGNOSIS — K861 Other chronic pancreatitis: Secondary | ICD-10-CM | POA: Diagnosis present

## 2023-09-29 DIAGNOSIS — C3490 Malignant neoplasm of unspecified part of unspecified bronchus or lung: Secondary | ICD-10-CM | POA: Diagnosis present

## 2023-09-29 DIAGNOSIS — Z1152 Encounter for screening for COVID-19: Secondary | ICD-10-CM | POA: Diagnosis not present

## 2023-09-29 DIAGNOSIS — G928 Other toxic encephalopathy: Secondary | ICD-10-CM | POA: Diagnosis present

## 2023-09-29 DIAGNOSIS — R0602 Shortness of breath: Secondary | ICD-10-CM | POA: Diagnosis not present

## 2023-09-29 DIAGNOSIS — R531 Weakness: Secondary | ICD-10-CM | POA: Diagnosis not present

## 2023-09-29 DIAGNOSIS — D649 Anemia, unspecified: Secondary | ICD-10-CM | POA: Diagnosis not present

## 2023-09-29 DIAGNOSIS — Z66 Do not resuscitate: Secondary | ICD-10-CM | POA: Diagnosis present

## 2023-09-29 DIAGNOSIS — K5900 Constipation, unspecified: Secondary | ICD-10-CM | POA: Diagnosis not present

## 2023-09-29 DIAGNOSIS — G8929 Other chronic pain: Secondary | ICD-10-CM | POA: Diagnosis present

## 2023-09-29 DIAGNOSIS — D630 Anemia in neoplastic disease: Secondary | ICD-10-CM | POA: Diagnosis present

## 2023-10-01 ENCOUNTER — Telehealth: Payer: Self-pay | Admitting: Internal Medicine

## 2023-10-01 DIAGNOSIS — C3490 Malignant neoplasm of unspecified part of unspecified bronchus or lung: Secondary | ICD-10-CM | POA: Diagnosis not present

## 2023-10-01 DIAGNOSIS — Z7901 Long term (current) use of anticoagulants: Secondary | ICD-10-CM | POA: Diagnosis not present

## 2023-10-01 DIAGNOSIS — D649 Anemia, unspecified: Secondary | ICD-10-CM | POA: Diagnosis not present

## 2023-10-01 DIAGNOSIS — M75122 Complete rotator cuff tear or rupture of left shoulder, not specified as traumatic: Secondary | ICD-10-CM | POA: Diagnosis not present

## 2023-10-01 DIAGNOSIS — M6281 Muscle weakness (generalized): Secondary | ICD-10-CM | POA: Diagnosis not present

## 2023-10-03 DIAGNOSIS — Z7189 Other specified counseling: Secondary | ICD-10-CM | POA: Diagnosis not present

## 2023-10-03 DIAGNOSIS — I480 Paroxysmal atrial fibrillation: Secondary | ICD-10-CM | POA: Diagnosis not present

## 2023-10-03 DIAGNOSIS — D649 Anemia, unspecified: Secondary | ICD-10-CM | POA: Diagnosis not present

## 2023-10-03 DIAGNOSIS — M545 Low back pain, unspecified: Secondary | ICD-10-CM | POA: Diagnosis not present

## 2023-10-03 DIAGNOSIS — M6281 Muscle weakness (generalized): Secondary | ICD-10-CM | POA: Diagnosis not present

## 2023-10-03 DIAGNOSIS — C3492 Malignant neoplasm of unspecified part of left bronchus or lung: Secondary | ICD-10-CM | POA: Diagnosis not present

## 2023-10-03 DIAGNOSIS — G8929 Other chronic pain: Secondary | ICD-10-CM | POA: Diagnosis not present

## 2023-10-03 DIAGNOSIS — C3490 Malignant neoplasm of unspecified part of unspecified bronchus or lung: Secondary | ICD-10-CM | POA: Diagnosis not present

## 2023-10-03 DIAGNOSIS — Z7902 Long term (current) use of antithrombotics/antiplatelets: Secondary | ICD-10-CM | POA: Diagnosis not present

## 2023-10-03 DIAGNOSIS — M75122 Complete rotator cuff tear or rupture of left shoulder, not specified as traumatic: Secondary | ICD-10-CM | POA: Diagnosis not present

## 2023-10-03 DIAGNOSIS — Z7901 Long term (current) use of anticoagulants: Secondary | ICD-10-CM | POA: Diagnosis not present

## 2023-10-05 ENCOUNTER — Emergency Department (HOSPITAL_COMMUNITY)

## 2023-10-05 ENCOUNTER — Encounter (HOSPITAL_COMMUNITY): Payer: Self-pay

## 2023-10-05 ENCOUNTER — Other Ambulatory Visit: Payer: Self-pay

## 2023-10-05 ENCOUNTER — Inpatient Hospital Stay (HOSPITAL_COMMUNITY)
Admission: EM | Admit: 2023-10-05 | Discharge: 2023-10-10 | DRG: 177 | Disposition: A | Discharge status: Skilled Nursing Facility | Attending: Family Medicine | Admitting: Family Medicine

## 2023-10-05 DIAGNOSIS — K8020 Calculus of gallbladder without cholecystitis without obstruction: Secondary | ICD-10-CM | POA: Diagnosis not present

## 2023-10-05 DIAGNOSIS — M21372 Foot drop, left foot: Secondary | ICD-10-CM | POA: Diagnosis present

## 2023-10-05 DIAGNOSIS — K5900 Constipation, unspecified: Secondary | ICD-10-CM | POA: Diagnosis not present

## 2023-10-05 DIAGNOSIS — J189 Pneumonia, unspecified organism: Secondary | ICD-10-CM | POA: Diagnosis not present

## 2023-10-05 DIAGNOSIS — R1312 Dysphagia, oropharyngeal phase: Secondary | ICD-10-CM | POA: Diagnosis not present

## 2023-10-05 DIAGNOSIS — G8929 Other chronic pain: Secondary | ICD-10-CM | POA: Diagnosis present

## 2023-10-05 DIAGNOSIS — Z789 Other specified health status: Secondary | ICD-10-CM

## 2023-10-05 DIAGNOSIS — R1110 Vomiting, unspecified: Secondary | ICD-10-CM

## 2023-10-05 DIAGNOSIS — I2510 Atherosclerotic heart disease of native coronary artery without angina pectoris: Secondary | ICD-10-CM | POA: Diagnosis present

## 2023-10-05 DIAGNOSIS — J188 Other pneumonia, unspecified organism: Secondary | ICD-10-CM | POA: Diagnosis present

## 2023-10-05 DIAGNOSIS — G928 Other toxic encephalopathy: Secondary | ICD-10-CM | POA: Diagnosis not present

## 2023-10-05 DIAGNOSIS — K8689 Other specified diseases of pancreas: Secondary | ICD-10-CM | POA: Diagnosis not present

## 2023-10-05 DIAGNOSIS — Z888 Allergy status to other drugs, medicaments and biological substances status: Secondary | ICD-10-CM

## 2023-10-05 DIAGNOSIS — K861 Other chronic pancreatitis: Secondary | ICD-10-CM | POA: Diagnosis present

## 2023-10-05 DIAGNOSIS — K219 Gastro-esophageal reflux disease without esophagitis: Secondary | ICD-10-CM | POA: Diagnosis not present

## 2023-10-05 DIAGNOSIS — I69354 Hemiplegia and hemiparesis following cerebral infarction affecting left non-dominant side: Secondary | ICD-10-CM

## 2023-10-05 DIAGNOSIS — Z7902 Long term (current) use of antithrombotics/antiplatelets: Secondary | ICD-10-CM

## 2023-10-05 DIAGNOSIS — Z86711 Personal history of pulmonary embolism: Secondary | ICD-10-CM

## 2023-10-05 DIAGNOSIS — R531 Weakness: Secondary | ICD-10-CM | POA: Diagnosis not present

## 2023-10-05 DIAGNOSIS — I700 Atherosclerosis of aorta: Secondary | ICD-10-CM | POA: Diagnosis not present

## 2023-10-05 DIAGNOSIS — F419 Anxiety disorder, unspecified: Secondary | ICD-10-CM | POA: Diagnosis present

## 2023-10-05 DIAGNOSIS — R64 Cachexia: Secondary | ICD-10-CM | POA: Diagnosis present

## 2023-10-05 DIAGNOSIS — M109 Gout, unspecified: Secondary | ICD-10-CM | POA: Diagnosis not present

## 2023-10-05 DIAGNOSIS — D630 Anemia in neoplastic disease: Secondary | ICD-10-CM | POA: Diagnosis present

## 2023-10-05 DIAGNOSIS — E7800 Pure hypercholesterolemia, unspecified: Secondary | ICD-10-CM | POA: Diagnosis not present

## 2023-10-05 DIAGNOSIS — Z7952 Long term (current) use of systemic steroids: Secondary | ICD-10-CM

## 2023-10-05 DIAGNOSIS — G9389 Other specified disorders of brain: Secondary | ICD-10-CM | POA: Diagnosis not present

## 2023-10-05 DIAGNOSIS — J690 Pneumonitis due to inhalation of food and vomit: Secondary | ICD-10-CM | POA: Diagnosis not present

## 2023-10-05 DIAGNOSIS — Z515 Encounter for palliative care: Secondary | ICD-10-CM | POA: Diagnosis not present

## 2023-10-05 DIAGNOSIS — M19012 Primary osteoarthritis, left shoulder: Secondary | ICD-10-CM | POA: Diagnosis not present

## 2023-10-05 DIAGNOSIS — Z681 Body mass index (BMI) 19 or less, adult: Secondary | ICD-10-CM

## 2023-10-05 DIAGNOSIS — I480 Paroxysmal atrial fibrillation: Secondary | ICD-10-CM | POA: Diagnosis not present

## 2023-10-05 DIAGNOSIS — Z8249 Family history of ischemic heart disease and other diseases of the circulatory system: Secondary | ICD-10-CM

## 2023-10-05 DIAGNOSIS — C3490 Malignant neoplasm of unspecified part of unspecified bronchus or lung: Secondary | ICD-10-CM | POA: Diagnosis not present

## 2023-10-05 DIAGNOSIS — Z86718 Personal history of other venous thrombosis and embolism: Secondary | ICD-10-CM

## 2023-10-05 DIAGNOSIS — I69391 Dysphagia following cerebral infarction: Secondary | ICD-10-CM

## 2023-10-05 DIAGNOSIS — E119 Type 2 diabetes mellitus without complications: Secondary | ICD-10-CM | POA: Diagnosis not present

## 2023-10-05 DIAGNOSIS — M25512 Pain in left shoulder: Secondary | ICD-10-CM | POA: Diagnosis present

## 2023-10-05 DIAGNOSIS — Z66 Do not resuscitate: Secondary | ICD-10-CM | POA: Diagnosis not present

## 2023-10-05 DIAGNOSIS — R1111 Vomiting without nausea: Secondary | ICD-10-CM | POA: Diagnosis not present

## 2023-10-05 DIAGNOSIS — I3139 Other pericardial effusion (noninflammatory): Secondary | ICD-10-CM | POA: Diagnosis not present

## 2023-10-05 DIAGNOSIS — R0602 Shortness of breath: Secondary | ICD-10-CM | POA: Diagnosis not present

## 2023-10-05 DIAGNOSIS — R400 Somnolence: Secondary | ICD-10-CM | POA: Insufficient documentation

## 2023-10-05 DIAGNOSIS — Z993 Dependence on wheelchair: Secondary | ICD-10-CM

## 2023-10-05 DIAGNOSIS — R42 Dizziness and giddiness: Secondary | ICD-10-CM | POA: Diagnosis not present

## 2023-10-05 DIAGNOSIS — Z1152 Encounter for screening for COVID-19: Secondary | ICD-10-CM

## 2023-10-05 DIAGNOSIS — D6481 Anemia due to antineoplastic chemotherapy: Secondary | ICD-10-CM | POA: Diagnosis present

## 2023-10-05 DIAGNOSIS — Z7969 Long term (current) use of other immunomodulators and immunosuppressants: Secondary | ICD-10-CM

## 2023-10-05 DIAGNOSIS — M25519 Pain in unspecified shoulder: Secondary | ICD-10-CM | POA: Diagnosis not present

## 2023-10-05 DIAGNOSIS — I10 Essential (primary) hypertension: Secondary | ICD-10-CM | POA: Diagnosis present

## 2023-10-05 DIAGNOSIS — Z0389 Encounter for observation for other suspected diseases and conditions ruled out: Secondary | ICD-10-CM | POA: Diagnosis not present

## 2023-10-05 DIAGNOSIS — G9340 Encephalopathy, unspecified: Secondary | ICD-10-CM | POA: Insufficient documentation

## 2023-10-05 DIAGNOSIS — T451X5A Adverse effect of antineoplastic and immunosuppressive drugs, initial encounter: Secondary | ICD-10-CM | POA: Diagnosis present

## 2023-10-05 DIAGNOSIS — R0902 Hypoxemia: Secondary | ICD-10-CM | POA: Diagnosis not present

## 2023-10-05 DIAGNOSIS — I69398 Other sequelae of cerebral infarction: Secondary | ICD-10-CM

## 2023-10-05 DIAGNOSIS — Z79899 Other long term (current) drug therapy: Secondary | ICD-10-CM

## 2023-10-05 DIAGNOSIS — Z955 Presence of coronary angioplasty implant and graft: Secondary | ICD-10-CM

## 2023-10-05 DIAGNOSIS — I672 Cerebral atherosclerosis: Secondary | ICD-10-CM | POA: Diagnosis not present

## 2023-10-05 DIAGNOSIS — Z91041 Radiographic dye allergy status: Secondary | ICD-10-CM

## 2023-10-05 LAB — I-STAT CHEM 8, ED
BUN: 15 mg/dL (ref 8–23)
Calcium, Ion: 1.07 mmol/L — ABNORMAL LOW (ref 1.15–1.40)
Chloride: 98 mmol/L (ref 98–111)
Creatinine, Ser: 1.2 mg/dL (ref 0.61–1.24)
Glucose, Bld: 189 mg/dL — ABNORMAL HIGH (ref 70–99)
HCT: 25 % — ABNORMAL LOW (ref 39.0–52.0)
Hemoglobin: 8.5 g/dL — ABNORMAL LOW (ref 13.0–17.0)
Potassium: 4.5 mmol/L (ref 3.5–5.1)
Sodium: 134 mmol/L — ABNORMAL LOW (ref 135–145)
TCO2: 25 mmol/L (ref 22–32)

## 2023-10-05 LAB — PROTIME-INR
INR: 1.1 (ref 0.8–1.2)
Prothrombin Time: 14.7 s (ref 11.4–15.2)

## 2023-10-05 LAB — COMPREHENSIVE METABOLIC PANEL WITH GFR
ALT: 17 U/L (ref 0–44)
AST: 19 U/L (ref 15–41)
Albumin: 1.8 g/dL — ABNORMAL LOW (ref 3.5–5.0)
Alkaline Phosphatase: 67 U/L (ref 38–126)
Anion gap: 7 (ref 5–15)
BUN: 16 mg/dL (ref 8–23)
CO2: 26 mmol/L (ref 22–32)
Calcium: 7.7 mg/dL — ABNORMAL LOW (ref 8.9–10.3)
Chloride: 98 mmol/L (ref 98–111)
Creatinine, Ser: 1.22 mg/dL (ref 0.61–1.24)
GFR, Estimated: 59 mL/min — ABNORMAL LOW (ref >60)
Glucose, Bld: 188 mg/dL — ABNORMAL HIGH (ref 70–99)
Potassium: 5.1 mmol/L (ref 3.5–5.1)
Sodium: 131 mmol/L — ABNORMAL LOW (ref 135–145)
Total Bilirubin: 1.6 mg/dL — ABNORMAL HIGH (ref 0.0–1.2)
Total Protein: 5 g/dL — ABNORMAL LOW (ref 6.5–8.1)

## 2023-10-05 LAB — CBC WITH DIFFERENTIAL/PLATELET

## 2023-10-05 LAB — I-STAT CG4 LACTIC ACID, ED: Lactic Acid, Venous: 0.5 mmol/L (ref 0.5–1.9)

## 2023-10-05 NOTE — ED Triage Notes (Signed)
 Pt arrives EMS for possible sepsis with reports of emesis, SHOB and dizziness per pt. Pt reports he is feeling better on arrival. Reports left shoulder pain from torn rotator cuff. Pt alert and oriented on arrival. Pt was given 25mcg fent and 4mg  zofran  PTA. CBG 190

## 2023-10-05 NOTE — ED Provider Notes (Incomplete)
 [facility] [facility] AT [facility] Provider Note   CSN: [id number] Arrival date & time: 10/05/23  2243     Patient presents with: Emesis and Shortness of Breath   Arthur [name] is a 83 y.o. male.  Patient with past medical history significant stage IV non-small cell lung cancer, adenocarcinoma on Alecensa  status post 22 months of treatment, patient on Lovenox  due to DVT/PE, type II DM, hypertension, chronic left foot drop presents to the emergency department via EMS from a nursing facility due to concerns of shortness of breath and emesis.  EMS also reported patient  complained of some dizziness and left shoulder pain.  At the time of arrival patient is mostly concerned about left-sided shoulder pain which is chronic in nature due to a recently diagnosed rotator cuff and shortness of breath.  He has no other complaining of dizziness.  EMS administered 25 mcg of fentanyl  and 4 mg Zofran .  {Add pertinent medical, surgical, social history, OB history to HPI:32947}  Emesis Shortness of Breath Associated symptoms: vomiting        Prior to Admission medications   Medication Sig Start Date End Date Taking? Authorizing Provider  alectinib (ALECENSA ) 150 MG capsule Take 4 capsules (600 mg total) by mouth 2 (two) times daily with a meal. 07/18/23   Sherrod Sherrod, MD  baclofen  (LIORESAL ) 10 MG tablet Take 1 tablet (10 mg total) by mouth 2 (two) times daily as needed for muscle spasms. 01/31/23   Jeanelle Layman CROME, MD  busPIRone  (BUSPAR ) 5 MG tablet Take 1 tablet (5 mg total) by mouth 2 (two) times daily. 09/09/23   Rosendo Rush, MD  clopidogrel  (PLAVIX ) 75 MG tablet Take 1 tablet (75 mg total) by mouth daily with breakfast. 09/09/23   Rosendo Rush, MD  diclofenac  Sodium (VOLTAREN ) 1 % GEL Apply 2 g topically as needed. 06/15/20   [provider]  enoxaparin  (LOVENOX ) 80 MG/0.8ML injection INJECT 0.7 ML (70 MG) INTO THE SKIN EVERY 12 HOURS 09/02/23   [name],  [name], DO  Iron -FA-B Cmp-C-Biot-Probiotic (FUSION PLUS) CAPS Take 1 capsule by mouth once daily 08/06/23   [name], [name], DO  oxyCODONE  (OXY IR/ROXICODONE ) 5 MG immediate release tablet Take 0.5-1 tablets (2.5-5 mg total) by mouth every 6 (six) hours as needed for severe pain (pain score 7-10), moderate pain (pain score 4-6) or breakthrough pain (2.5mg  for moderate to severe pain, 5mg  for breakthrough pain). 09/23/23   [name], [name], DO  pantoprazole  (PROTONIX ) 40 MG tablet Take 1 tablet (40 mg total) by mouth 2 (two) times daily. 09/23/23   [name], [name], DO  predniSONE  (DELTASONE ) 10 MG tablet Take 1 tablet (10 mg total) by mouth daily with breakfast. 08/19/23   Heilingoetter, Cassandra L, PA-C  rosuvastatin  (CRESTOR ) 5 MG tablet Take 1 tablet by mouth once daily 05/19/23   Gomes, Adriana, DO  sodium chloride  (OCEAN) 0.65 % SOLN nasal spray Place 1 spray into both nostrils as needed for congestion. 09/06/22   Howell Lunger, DO    Allergies: Ace inhibitors and Iohexol     Review of Systems  Respiratory:  Positive for shortness of breath.   Gastrointestinal:  Positive for vomiting.    Updated Vital Signs BP (!) 116/56   Pulse 96   Temp 97.8 F (36.6 C) (Rectal)   Resp 14   Wt 62.5 kg   SpO2 100%   BMI 19.22 kg/m   Physical Exam Vitals and nursing note reviewed.  Constitutional:      General: He  is not in acute distress.    Appearance: He is well-developed.  HENT:     Head: Normocephalic and atraumatic.  Eyes:     Conjunctiva/sclera: Conjunctivae normal.  Cardiovascular:     Rate and Rhythm: Normal rate. Rhythm irregular.  Pulmonary:     Effort: Pulmonary effort is normal. No respiratory distress.     Breath sounds: Normal breath sounds.  Abdominal:     Palpations: Abdomen is soft.     Tenderness: There is abdominal tenderness.     Comments: Generalized abdominal tenderness  Musculoskeletal:        General: No swelling.     Cervical back: Neck supple.     Right  lower leg: Edema present.     Left lower leg: Edema present.     Comments: Nonpitting edema in bilateral lower extremities. Patient complains of left shoulder pain with any passive movement  Skin:    General: Skin is warm and dry.     Capillary Refill: Capillary refill takes less than 2 seconds.  Neurological:     Mental Status: He is alert.  Psychiatric:        Mood and Affect: Mood normal.     (all labs ordered are listed, but only abnormal results are displayed) Labs Reviewed  CULTURE, BLOOD (ROUTINE X 2)  CULTURE, BLOOD (ROUTINE X 2)  RESP PANEL BY RT-PCR (RSV, FLU A&B, COVID)  [id number]  COMPREHENSIVE METABOLIC PANEL WITH GFR  CBC WITH DIFFERENTIAL/PLATELET  PROTIME-INR  URINALYSIS, W/ REFLEX TO CULTURE (INFECTION SUSPECTED)  I-STAT CG4 LACTIC ACID, ED  I-STAT CHEM 8, ED    EKG: None  Radiology: No results found.  {Document cardiac monitor, telemetry assessment procedure when appropriate:32947} Procedures   Medications Ordered in the ED - No data to display    {Click here for ABCD2, HEART and other calculators REFRESH Note before signing:1}                              Medical Decision Making Amount and/or Complexity of Data Reviewed Labs: ordered. Radiology: ordered.   This patient presents to the ED for concern of ***, this involves an extensive number of treatment options, and is a complaint that carries with it a high risk of complications and morbidity.  The differential diagnosis includes ***   Co morbidities / Chronic conditions that complicate the patient evaluation  ***   Additional history obtained:  Additional history obtained from EMR External records from outside source obtained and reviewed including ***   Lab Tests:  I Ordered, and personally interpreted labs.  The pertinent results include:  ***   Imaging Studies ordered:  I ordered imaging studies including ***  I independently visualized and interpreted imaging which showed  *** I agree with the radiologist interpretation   Cardiac Monitoring: / EKG:  The patient was maintained on a cardiac monitor.  I personally viewed and interpreted the cardiac monitored which showed an underlying rhythm of: ***   Problem List / ED Course / Critical interventions / Medication management  *** I ordered medication including ***   Reevaluation of the patient after these medicines showed that the patient *** I have reviewed the patients home medicines and have made adjustments as needed   Consultations Obtained:  I requested consultation with the ***,  and discussed lab and imaging findings as well as pertinent plan - they recommend: ***   Social Determinants of Health:  ***  Test / Admission - Considered:  ***   {Document critical care time when appropriate  Document review of labs and clinical decision tools ie CHADS2VASC2, etc  Document your independent review of radiology images and any outside records  Document your discussion with family members, caretakers and with consultants  Document social determinants of health affecting pt's care  Document your decision making why or why not admission, treatments were needed:32947:::1}   Final diagnoses:  None    ED Discharge Orders     None

## 2023-10-05 NOTE — ED Provider Notes (Signed)
 [place] EMERGENCY DEPARTMENT AT [facility] Provider Note   CSN: [id number] Arrival date & time: 10/05/23  2243     Patient presents with: Emesis and Shortness of Breath   Arthur [name] is a 83 y.o. male.  Patient with past medical history significant stage IV non-small cell lung cancer, adenocarcinoma on Alecensa  status post 22 months of treatment, patient on Lovenox  due to DVT/PE, type II DM, hypertension, chronic left foot drop presents to the emergency department via EMS from a nursing facility due to concerns of shortness of breath and emesis.  EMS also reported patient  complained of some dizziness and left shoulder pain.  At the time of arrival patient is mostly concerned about left-sided shoulder pain which is chronic in nature due to a recently diagnosed rotator cuff and shortness of breath.  He has no other complaining of dizziness.  EMS administered 25 mcg of fentanyl  and 4 mg Zofran .    Emesis Shortness of Breath Associated symptoms: vomiting        Prior to Admission medications   Medication Sig Start Date End Date Taking? Authorizing Provider  alectinib (ALECENSA ) 150 MG capsule Take 4 capsules (600 mg total) by mouth 2 (two) times daily with a meal. 07/18/23   Sherrod Sherrod, MD  baclofen  (LIORESAL ) 10 MG tablet Take 1 tablet (10 mg total) by mouth 2 (two) times daily as needed for muscle spasms. 01/31/23   Jeanelle Layman CROME, MD  busPIRone  (BUSPAR ) 5 MG tablet Take 1 tablet (5 mg total) by mouth 2 (two) times daily. 09/09/23   Rosendo Rush, MD  clopidogrel  (PLAVIX ) 75 MG tablet Take 1 tablet (75 mg total) by mouth daily with breakfast. 09/09/23   Rosendo Rush, MD  diclofenac  Sodium (VOLTAREN ) 1 % GEL Apply 2 g topically as needed. 06/15/20   [provider]  enoxaparin  (LOVENOX ) 80 MG/0.8ML injection INJECT 0.7 ML (70 MG) INTO THE SKIN EVERY 12 HOURS 09/02/23   [name], [name], DO  Iron -FA-B Cmp-C-Biot-Probiotic (FUSION PLUS) CAPS Take 1 capsule  by mouth once daily 08/06/23   [name], [name], DO  oxyCODONE  (OXY IR/ROXICODONE ) 5 MG immediate release tablet Take 0.5-1 tablets (2.5-5 mg total) by mouth every 6 (six) hours as needed for severe pain (pain score 7-10), moderate pain (pain score 4-6) or breakthrough pain (2.5mg  for moderate to severe pain, 5mg  for breakthrough pain). 09/23/23   [name], [name], DO  pantoprazole  (PROTONIX ) 40 MG tablet Take 1 tablet (40 mg total) by mouth 2 (two) times daily. 09/23/23   [name], [name], DO  predniSONE  (DELTASONE ) 10 MG tablet Take 1 tablet (10 mg total) by mouth daily with breakfast. 08/19/23   Heilingoetter, Cassandra L, PA-C  rosuvastatin  (CRESTOR ) 5 MG tablet Take 1 tablet by mouth once daily 05/19/23   Gomes, Adriana, DO  sodium chloride  (OCEAN) 0.65 % SOLN nasal spray Place 1 spray into both nostrils as needed for congestion. 09/06/22   Howell Lunger, DO    Allergies: Ace inhibitors and Iohexol     Review of Systems  Respiratory:  Positive for shortness of breath.   Gastrointestinal:  Positive for vomiting.    Updated Vital Signs BP 111/61   Pulse 96   Temp 97.8 F (36.6 C) (Rectal)   Resp 11   Wt 62.5 kg   SpO2 99%   BMI 19.22 kg/m   Physical Exam Vitals and nursing note reviewed.  Constitutional:      General: He is not in acute distress.    Appearance: He  is well-developed.  HENT:     Head: Normocephalic and atraumatic.  Eyes:     Conjunctiva/sclera: Conjunctivae normal.  Cardiovascular:     Rate and Rhythm: Normal rate. Rhythm irregular.  Pulmonary:     Effort: Pulmonary effort is normal. No respiratory distress.     Breath sounds: Normal breath sounds.  Abdominal:     Palpations: Abdomen is soft.     Tenderness: There is abdominal tenderness.     Comments: Generalized abdominal tenderness  Musculoskeletal:        General: No swelling.     Cervical back: Neck supple.     Right lower leg: Edema present.     Left lower leg: Edema present.     Comments:  Nonpitting edema in bilateral lower extremities. Patient complains of left shoulder pain with any passive movement  Skin:    General: Skin is warm and dry.     Capillary Refill: Capillary refill takes less than 2 seconds.  Neurological:     Mental Status: He is alert.  Psychiatric:        Mood and Affect: Mood normal.     (all labs ordered are listed, but only abnormal results are displayed) Labs Reviewed  COMPREHENSIVE METABOLIC PANEL WITH GFR - Abnormal; Notable for the following components:      Result Value   Sodium 131 (*)    Glucose, Bld 188 (*)    Calcium  7.7 (*)    Total Protein 5.0 (*)    Albumin  1.8 (*)    Total Bilirubin 1.6 (*)    GFR, Estimated 59 (*)    All other components within normal limits  CBC WITH DIFFERENTIAL/PLATELET - Abnormal; Notable for the following components:   RBC 3.06 (*)    Hemoglobin 8.7 (*)    HCT 26.3 (*)    RDW 21.2 (*)    Platelets 425 (*)    Neutro Abs 8.7 (*)    Lymphs Abs 0.5 (*)    All other components within normal limits  URINALYSIS, W/ REFLEX TO CULTURE (INFECTION SUSPECTED) - Abnormal; Notable for the following components:   Color, Urine AMBER (*)    APPearance HAZY (*)    All other components within normal limits  I-STAT CHEM 8, ED - Abnormal; Notable for the following components:   Sodium 134 (*)    Glucose, Bld 189 (*)    Calcium , Ion 1.07 (*)    Hemoglobin 8.5 (*)    HCT 25.0 (*)    All other components within normal limits  RESP PANEL BY RT-PCR (RSV, FLU A&B, COVID)  RVPGX2  CULTURE, BLOOD (ROUTINE X 2)  CULTURE, BLOOD (ROUTINE X 2)  PROTIME-INR  LIPASE, BLOOD  COMPREHENSIVE METABOLIC PANEL WITH GFR  CBC  BLOOD GAS, VENOUS  I-STAT CG4 LACTIC ACID, ED    EKG: EKG Interpretation Date/Time:  Sunday October 05 2023 22:50:26 EDT Ventricular Rate:  103 PR Interval:    QRS Duration:  80 QT Interval:  317 QTC Calculation: 415 R Axis:   59  Text Interpretation: Atrial fibrillation Borderline T wave  abnormalities Confirmed by Lorette Mayo [phone]) on 10/05/2023 11:05:09 PM  Radiology: CT CHEST ABDOMEN PELVIS WO CONTRAST Result Date: 10/06/2023 EXAM: CT CHEST, ABDOMEN AND PELVIS WITHOUT CONTRAST 10/05/2023 11:45:17 PM TECHNIQUE: CT of the chest, abdomen and pelvis was performed without the administration of intravenous contrast. Multiplanar reformatted images are provided for review. Automated exposure control, iterative reconstruction, and/or weight based adjustment of the mA/kV was utilized to reduce  the radiation dose to as low as reasonably achievable. COMPARISON: Comparison with same day chest radiographs; CT abdomen and pelvis 05/27/2023 and CT chest 05/27/2023 CLINICAL HISTORY: Sepsis. FINDINGS: CHEST: MEDIASTINUM AND LYMPH NODES: Small pericardial effusion. Coronary artery and aortic atherosclerotic calcifications. Coronary stenting. No mediastinal, hilar or axillary lymphadenopathy. LUNGS AND PLEURA: Wetting debris in the trachea. Bronchial wall thickening in the left lower lobe with patchy central lobular ground-glass nodules. Small left and trace right pleural effusions. No pneumothorax. ABDOMEN AND PELVIS: LIVER: The liver is unremarkable. GALLBLADDER AND BILE DUCTS: Cholelithiasis. No evidence of acute cholecystitis. No biliary dilation. SPLEEN: No acute abnormality. PANCREAS: Coarse calcifications in the pancreas compatible with sequelae of chronic pancreatitis. Similar dilation of the main pancreatic duct measuring up to 9 mm. ADRENAL GLANDS: No acute abnormality. KIDNEYS, URETERS AND BLADDER: Nonobstructing stone in the right kidney. No hydronephrosis. No perinephric or periureteral stranding. Urinary bladder is unremarkable. GI AND BOWEL: Stomach demonstrates no acute abnormality. There is no bowel obstruction. PERITONEUM AND RETROPERITONEUM: No ascites. No free air. VASCULATURE: Normal caliber thoracic aorta. Aortic atherosclerotic calcification. IVC filter. ABDOMINAL AND PELVIS LYMPH NODES:  No lymphadenopathy. REPRODUCTIVE ORGANS: No acute abnormality. BONES AND SOFT TISSUES: Heterogeneous enlargement of the right subscapularis suggestive of intramuscular hematoma. This measures approximately 8.1 x 3.8 cm. No acute fracture. Remote left rib fractures. Nodular thickening of the anterior soft tissues with subcutaneous gas likely due to injections. IMPRESSION: 1. Infiltrates in the left lower lobe compatible with pneumonia/aspiration. 2. Small left and trace right pleural effusions. 3. Heterogeneous enlargement of the right subscapularis suggestive of intramuscular hematoma, measuring approximately 8.1 x 3.8 cm. 4. Cholelithiasis without evidence of acute cholecystitis or biliary dilation. 5. Coarse calcifications in the pancreas compatible with sequelae of chronic pancreatitis. Electronically signed by: Norman Gatlin MD 10/06/2023 12:00 AM EDT RP Workstation: [id number]   CT Head Wo Contrast Result Date: 10/05/2023 CLINICAL DATA:  Dizziness EXAM: CT HEAD WITHOUT CONTRAST TECHNIQUE: Contiguous axial images were obtained from the base of the skull through the vertex without intravenous contrast. RADIATION DOSE REDUCTION: This exam was performed according to the departmental dose-optimization program which includes automated exposure control, adjustment of the mA and/or kV according to patient size and/or use of iterative reconstruction technique. COMPARISON:  09/14/2023 FINDINGS: Brain: Stable left occipital encephalomalacia consistent with prior infarct. Chronic small vessel ischemic changes are seen throughout the periventricular white matter, stable. No evidence of acute infarct or hemorrhage. Lateral ventricles and midline structures are unremarkable. No acute extra-axial fluid collections. No mass effect. Vascular: No hyperdense vessel or unexpected calcification. Stable atherosclerosis. Skull: Normal. Negative for fracture or focal lesion. Sinuses/Orbits: No acute finding. Other: None. IMPRESSION:  1. Stable head CT, no acute intracranial process. Electronically Signed   By: Ozell Daring M.D.   On: 10/05/2023 23:48   DG Shoulder Left Portable Result Date: 10/05/2023 EXAM: XRAY OF THE LEFT SHOULDER 10/05/2023 11:24:00 PM COMPARISON: 09/14/2023 CLINICAL HISTORY: Questionable sepsis - evaluate for abnormality. FINDINGS: BONES AND JOINTS: Glenohumeral joint is normally aligned. No acute fracture or dislocation. Mild degenerative changes of the acromioclavicular joint. Old healed left rib fractures. SOFT TISSUES: No abnormal calcifications. Visualized lung is unremarkable. IMPRESSION: 1. No acute findings. Electronically signed by: Norman Gatlin MD 10/05/2023 11:32 PM EDT RP Workstation: [id number]   DG Chest Port 1 View Result Date: 10/05/2023 EXAM: 1 VIEW XRAY OF THE CHEST 10/05/2023 11:24:00 PM COMPARISON: 11/16/2021 CLINICAL HISTORY: Questionable sepsis - evaluate for abnormality. FINDINGS: LUNGS AND PLEURA: No focal pulmonary opacity. No pulmonary  edema. No pleural effusion. No pneumothorax. HEART AND MEDIASTINUM: Stable cardiomediastinal silhouette. Aortic atherosclerotic calcification. Coronary stenting. BONES AND SOFT TISSUES: No acute osseous abnormality. IMPRESSION: 1. No acute findings. Electronically signed by: Norman Gatlin MD 10/05/2023 11:31 PM EDT RP Workstation: [id number]     .Critical Care  Performed by: Logan Ubaldo NOVAK, PA-C Authorized by: Logan Ubaldo NOVAK, PA-C   Critical care provider statement:    Critical care time (minutes):  30   Critical care time was exclusive of:  Separately billable procedures and treating other patients   Critical care was necessary to treat or prevent imminent or life-threatening deterioration of the following conditions:  Respiratory failure   Critical care was time spent personally by me on the following activities:  Development of treatment plan with patient or surrogate, discussions with consultants, evaluation of patient's response to  treatment, examination of patient, ordering and review of laboratory studies, ordering and review of radiographic studies, ordering and performing treatments and interventions, pulse oximetry, re-evaluation of patient's condition and review of old charts   Care discussed with: admitting provider      Medications Ordered in the ED  azithromycin  (ZITHROMAX ) 500 mg in sodium chloride  0.9 % 250 mL IVPB (500 mg Intravenous New Bag/Given 10/06/23 0112)  enoxaparin  (LOVENOX ) injection 70 mg (has no administration in time range)  0.9 %  sodium chloride  infusion ( Intravenous New Bag/Given 10/06/23 0127)  cefTRIAXone  (ROCEPHIN ) 1 g in sodium chloride  0.9 % 100 mL IVPB (0 g Intravenous Stopped 10/06/23 0112)  naloxone  (NARCAN ) injection 0.4 mg (0.4 mg Intravenous Given 10/06/23 0123)  ipratropium-albuterol  (DUONEB) 0.5-2.5 (3) MG/3ML nebulizer solution 3 mL (3 mLs Nebulization Given 10/06/23 0127)                                    Medical Decision Making Amount and/or Complexity of Data Reviewed Labs: ordered. Radiology: ordered.  Risk Decision regarding hospitalization.   This patient presents to the ED for concern of shortness of breath, emesis, this involves an extensive number of treatment options, and is a complaint that carries with it a high risk of complications and morbidity.  The differential diagnosis includes pneumonia, appendicitis, cholecystitis, gastritis, gastroenteritis, others   Co morbidities / Chronic conditions that complicate the patient evaluation  Stage IV lung cancer, left-sided rotator cuff injury   Additional history obtained:  Additional history obtained from EMR External records from outside source obtained and reviewed including recent discharge summary   Lab Tests:  I Ordered, and personally interpreted labs.  The pertinent results include: No leukocytosis   Imaging Studies ordered:  I ordered imaging studies including chest x-ray, shoulder films of the left  shoulder, CT of the head without contrast, CT chest abdomen pelvis without contrast I independently visualized and interpreted imaging which showed no acute findings on plain films. 1. Infiltrates in the left lower lobe compatible with pneumonia/aspiration.  2. Small left and trace right pleural effusions.  3. Heterogeneous enlargement of the right subscapularis suggestive of  intramuscular hematoma, measuring approximately 8.1 x 3.8 cm.  4. Cholelithiasis without evidence of acute cholecystitis or biliary dilation.  5. Coarse calcifications in the pancreas compatible with sequelae of chronic  pancreatitis.  Stable head CT I agree with the radiologist interpretation   Cardiac Monitoring: / EKG:  The patient was maintained on a cardiac monitor.  I personally viewed and interpreted the cardiac monitored which showed an underlying rhythm  of: atrial fibrillation   Problem List / ED Course / Critical interventions / Medication management   I ordered medication including Rocephin , Zithromax  Reevaluation of the patient after these medicines showed that the patient stayed the same    Consultations Obtained:  I requested consultation with the family medicine service and discussed lab and imaging findings as well as pertinent plan - they recommend: admission   Test / Admission - Considered:  Patient with evidence on imaging of a new left lower lobe pneumonia.  Treatment initiated with Rocephin  and Zithromax .  Patient has a new oxygen requirement requiring 2+ liters of oxygen to maintain oxygen saturations above 90%.  He is not on oxygen at baseline.  Feel the patient would benefit from admission for further management.  CT does show chronic pancreatitis.  Upon reassessment patient is no longer complaining of abdominal pain. Lipase and UA sent with results pending. Due to lab issues lipase having to be sent out for analysis. Low suspicion of acute on chronic pancreatitis.       Final  diagnoses:  Pneumonia of left lower lobe due to infectious organism  Vomiting, unspecified vomiting type, unspecified whether nausea present    ED Discharge Orders     None          Logan Ubaldo KATHEE DEVONNA 10/06/23 0138    Mesner, Selinda, MD 10/06/23 [id number]

## 2023-10-06 ENCOUNTER — Other Ambulatory Visit: Payer: Self-pay

## 2023-10-06 ENCOUNTER — Inpatient Hospital Stay

## 2023-10-06 ENCOUNTER — Inpatient Hospital Stay (HOSPITAL_COMMUNITY)

## 2023-10-06 ENCOUNTER — Ambulatory Visit (HOSPITAL_COMMUNITY)

## 2023-10-06 DIAGNOSIS — R5383 Other fatigue: Secondary | ICD-10-CM | POA: Diagnosis not present

## 2023-10-06 DIAGNOSIS — R64 Cachexia: Secondary | ICD-10-CM | POA: Diagnosis present

## 2023-10-06 DIAGNOSIS — R569 Unspecified convulsions: Secondary | ICD-10-CM

## 2023-10-06 DIAGNOSIS — Z1152 Encounter for screening for COVID-19: Secondary | ICD-10-CM | POA: Diagnosis not present

## 2023-10-06 DIAGNOSIS — R29898 Other symptoms and signs involving the musculoskeletal system: Secondary | ICD-10-CM | POA: Diagnosis not present

## 2023-10-06 DIAGNOSIS — J188 Other pneumonia, unspecified organism: Secondary | ICD-10-CM | POA: Diagnosis present

## 2023-10-06 DIAGNOSIS — I10 Essential (primary) hypertension: Secondary | ICD-10-CM | POA: Diagnosis present

## 2023-10-06 DIAGNOSIS — R4182 Altered mental status, unspecified: Secondary | ICD-10-CM | POA: Diagnosis not present

## 2023-10-06 DIAGNOSIS — Z515 Encounter for palliative care: Secondary | ICD-10-CM | POA: Diagnosis not present

## 2023-10-06 DIAGNOSIS — J189 Pneumonia, unspecified organism: Secondary | ICD-10-CM | POA: Diagnosis present

## 2023-10-06 DIAGNOSIS — K861 Other chronic pancreatitis: Secondary | ICD-10-CM | POA: Diagnosis present

## 2023-10-06 DIAGNOSIS — R400 Somnolence: Secondary | ICD-10-CM | POA: Insufficient documentation

## 2023-10-06 DIAGNOSIS — R1312 Dysphagia, oropharyngeal phase: Secondary | ICD-10-CM | POA: Diagnosis present

## 2023-10-06 DIAGNOSIS — G8929 Other chronic pain: Secondary | ICD-10-CM | POA: Diagnosis present

## 2023-10-06 DIAGNOSIS — J690 Pneumonitis due to inhalation of food and vomit: Secondary | ICD-10-CM | POA: Diagnosis present

## 2023-10-06 DIAGNOSIS — Z681 Body mass index (BMI) 19 or less, adult: Secondary | ICD-10-CM | POA: Diagnosis not present

## 2023-10-06 DIAGNOSIS — I480 Paroxysmal atrial fibrillation: Secondary | ICD-10-CM | POA: Diagnosis present

## 2023-10-06 DIAGNOSIS — D630 Anemia in neoplastic disease: Secondary | ICD-10-CM | POA: Diagnosis present

## 2023-10-06 DIAGNOSIS — E119 Type 2 diabetes mellitus without complications: Secondary | ICD-10-CM | POA: Diagnosis present

## 2023-10-06 DIAGNOSIS — M109 Gout, unspecified: Secondary | ICD-10-CM | POA: Diagnosis present

## 2023-10-06 DIAGNOSIS — Z743 Need for continuous supervision: Secondary | ICD-10-CM | POA: Diagnosis not present

## 2023-10-06 DIAGNOSIS — Z8673 Personal history of transient ischemic attack (TIA), and cerebral infarction without residual deficits: Secondary | ICD-10-CM | POA: Diagnosis not present

## 2023-10-06 DIAGNOSIS — M25512 Pain in left shoulder: Secondary | ICD-10-CM | POA: Diagnosis present

## 2023-10-06 DIAGNOSIS — Z7401 Bed confinement status: Secondary | ICD-10-CM | POA: Diagnosis not present

## 2023-10-06 DIAGNOSIS — G928 Other toxic encephalopathy: Secondary | ICD-10-CM | POA: Diagnosis present

## 2023-10-06 DIAGNOSIS — K219 Gastro-esophageal reflux disease without esophagitis: Secondary | ICD-10-CM | POA: Diagnosis present

## 2023-10-06 DIAGNOSIS — I69391 Dysphagia following cerebral infarction: Secondary | ICD-10-CM | POA: Diagnosis not present

## 2023-10-06 DIAGNOSIS — K5900 Constipation, unspecified: Secondary | ICD-10-CM | POA: Diagnosis not present

## 2023-10-06 DIAGNOSIS — G9340 Encephalopathy, unspecified: Secondary | ICD-10-CM | POA: Insufficient documentation

## 2023-10-06 DIAGNOSIS — R1110 Vomiting, unspecified: Secondary | ICD-10-CM

## 2023-10-06 DIAGNOSIS — Z66 Do not resuscitate: Secondary | ICD-10-CM | POA: Diagnosis present

## 2023-10-06 DIAGNOSIS — M6281 Muscle weakness (generalized): Secondary | ICD-10-CM | POA: Diagnosis not present

## 2023-10-06 DIAGNOSIS — I69354 Hemiplegia and hemiparesis following cerebral infarction affecting left non-dominant side: Secondary | ICD-10-CM | POA: Diagnosis not present

## 2023-10-06 DIAGNOSIS — F419 Anxiety disorder, unspecified: Secondary | ICD-10-CM | POA: Diagnosis present

## 2023-10-06 DIAGNOSIS — C3490 Malignant neoplasm of unspecified part of unspecified bronchus or lung: Secondary | ICD-10-CM | POA: Diagnosis present

## 2023-10-06 DIAGNOSIS — E7800 Pure hypercholesterolemia, unspecified: Secondary | ICD-10-CM | POA: Diagnosis present

## 2023-10-06 DIAGNOSIS — Z7189 Other specified counseling: Secondary | ICD-10-CM | POA: Diagnosis not present

## 2023-10-06 LAB — CBC
HCT: 25.3 % — ABNORMAL LOW (ref 39.0–52.0)
Hemoglobin: 8.5 g/dL — ABNORMAL LOW (ref 13.0–17.0)
MCH: 28.8 pg (ref 26.0–34.0)
MCHC: 33.6 g/dL (ref 30.0–36.0)
MCV: 85.8 fL (ref 80.0–100.0)
Platelets: 410 K/uL — ABNORMAL HIGH (ref 150–400)
RBC: 2.95 MIL/uL — ABNORMAL LOW (ref 4.22–5.81)
RDW: 20.7 % — ABNORMAL HIGH (ref 11.5–15.5)
WBC: 9.7 K/uL (ref 4.0–10.5)
nRBC: 0 % (ref 0.0–0.2)

## 2023-10-06 LAB — URINALYSIS, W/ REFLEX TO CULTURE (INFECTION SUSPECTED)
Bacteria, UA: NONE SEEN
Bilirubin Urine: NEGATIVE
Glucose, UA: NEGATIVE mg/dL
Hgb urine dipstick: NEGATIVE
Ketones, ur: NEGATIVE mg/dL
Leukocytes,Ua: NEGATIVE
Nitrite: NEGATIVE
Protein, ur: NEGATIVE mg/dL
Specific Gravity, Urine: 1.015 (ref 1.005–1.030)
pH: 5 (ref 5.0–8.0)

## 2023-10-06 LAB — CBC WITH DIFFERENTIAL/PLATELET
Basophils Relative: 0 K/uL (ref 0.0–0.1)
Eosinophils Absolute: 0 K/uL (ref 0.0–0.5)
Eosinophils Relative: 0 K/uL (ref 0.0–0.5)
HCT: 26.3 % — ABNORMAL LOW (ref 39.0–52.0)
Hemoglobin: 8.7 g/dL — ABNORMAL LOW (ref 13.0–17.0)
Lymphocytes Relative: 5 %
Lymphs Abs: 0.5 K/uL — AB (ref 0.7–4.0)
MCH: 28.4 pg (ref 26.0–34.0)
MCHC: 33.1 g/dL (ref 30.0–36.0)
MCV: 85.9 fL (ref 80.0–100.0)
Monocytes Absolute: 0 K/uL (ref 0.1–1.0)
Monocytes Relative: 0.5 K/uL (ref 0.1–1.0)
Monocytes Relative: 6 K/uL (ref 0.7–4.0)
Neutro Abs: 8.7 K/uL — AB (ref 1.7–7.7)
Neutrophils Relative %: 88 %
Platelets: 425 K/uL — ABNORMAL HIGH (ref 150–400)
RBC Morphology: 1
RBC: 3.06 MIL/uL — ABNORMAL LOW (ref 4.22–5.81)
RDW: 21.2 % — ABNORMAL HIGH (ref 11.5–15.5)
Smear Review: 0.07 (ref 0.00–0.07)
WBC Morphology: NORMAL
WBC: 9.8 K/uL (ref 4.0–10.5)
nRBC: 0 % (ref 0.0–0.2)

## 2023-10-06 LAB — BLOOD GAS, VENOUS
Acid-Base Excess: 5.5 mmol/L — ABNORMAL HIGH (ref 0.0–2.0)
Bicarbonate: 31.6 mmol/L — ABNORMAL HIGH (ref 20.0–28.0)
O2 Saturation: 54.4 %
Patient temperature: 37
pCO2, Ven: 51 mmHg (ref 44–60)
pH, Ven: 7.4 (ref 7.25–7.43)
pO2, Ven: 36 mmHg (ref 32–45)

## 2023-10-06 LAB — COMPREHENSIVE METABOLIC PANEL WITH GFR
ALT: 15 U/L (ref 0–44)
AST: 17 U/L (ref 15–41)
Albumin: 1.7 g/dL — ABNORMAL LOW (ref 3.5–5.0)
Alkaline Phosphatase: 63 U/L (ref 38–126)
Anion gap: 9 (ref 5–15)
BUN: 16 mg/dL (ref 8–23)
CO2: 26 mmol/L (ref 22–32)
Calcium: 7.7 mg/dL — ABNORMAL LOW (ref 8.9–10.3)
Chloride: 98 mmol/L (ref 98–111)
Creatinine, Ser: 1.17 mg/dL (ref 0.61–1.24)
GFR, Estimated: >60 mL/min (ref >60)
Glucose, Bld: 166 mg/dL — ABNORMAL HIGH (ref 70–99)
Potassium: 4.5 mmol/L (ref 3.5–5.1)
Sodium: 133 mmol/L — ABNORMAL LOW (ref 135–145)
Total Bilirubin: 1.3 mg/dL — ABNORMAL HIGH (ref 0.0–1.2)
Total Protein: 4.4 g/dL — ABNORMAL LOW (ref 6.5–8.1)

## 2023-10-06 LAB — RESP PANEL BY RT-PCR (RSV, FLU A&B, COVID)  RVPGX2
Influenza A by PCR: NEGATIVE
Influenza B by PCR: NEGATIVE
Resp Syncytial Virus by PCR: NEGATIVE
SARS Coronavirus 2 by RT PCR: NEGATIVE

## 2023-10-06 LAB — TROPONIN I (HIGH SENSITIVITY)
Troponin I (High Sensitivity): 8 ng/L (ref <18)
Troponin I (High Sensitivity): 7 ng/L (ref <18)

## 2023-10-06 LAB — STREP PNEUMONIAE URINARY ANTIGEN: Strep Pneumo Urinary Antigen: NEGATIVE

## 2023-10-06 LAB — AMMONIA: Ammonia: 21 umol/L (ref 9–35)

## 2023-10-06 LAB — LIPASE, BLOOD: Lipase: 12 U/L (ref 11–51)

## 2023-10-06 MED ORDER — SODIUM CHLORIDE 0.9 % IV SOLN: INTRAVENOUS | Status: AC

## 2023-10-06 MED ORDER — ENOXAPARIN SODIUM 60 MG/0.6ML IJ SOSY
60.0000 mg | PREFILLED_SYRINGE | Freq: Two times a day (BID) | INTRAMUSCULAR | Status: DC
  Administered 2023-10-06 – 2023-10-10 (×9): 60 mg via SUBCUTANEOUS
  Filled 2023-10-06 (×10): qty 0.6

## 2023-10-06 MED ORDER — ALECTINIB HCL 150 MG PO CAPS
600.0000 mg | ORAL_CAPSULE | Freq: Two times a day (BID) | ORAL | Status: DC
Start: 2023-10-07 — End: 2023-10-07

## 2023-10-06 MED ORDER — METHYLPREDNISOLONE SODIUM SUCC 40 MG IJ SOLR: 16.0000 mg | Freq: Every day | INTRAMUSCULAR | Status: DC

## 2023-10-06 MED ORDER — ENOXAPARIN SODIUM 80 MG/0.8ML IJ SOSY
70.0000 mg | PREFILLED_SYRINGE | Freq: Two times a day (BID) | INTRAMUSCULAR | Status: DC
  Filled 2023-10-06: qty 0.7

## 2023-10-06 MED ORDER — SODIUM CHLORIDE 0.9 % IV SOLN
500.0000 mg | Freq: Every day | INTRAVENOUS | Status: AC
  Administered 2023-10-07 – 2023-10-08 (×2): 500 mg via INTRAVENOUS
  Filled 2023-10-06 (×2): qty 5

## 2023-10-06 MED ORDER — SODIUM CHLORIDE 0.9 % IV SOLN
1.0000 g | Freq: Once | INTRAVENOUS | Status: AC
  Administered 2023-10-06: 1 g via INTRAVENOUS
  Filled 2023-10-06: qty 10

## 2023-10-06 MED ORDER — NALOXONE HCL 0.4 MG/ML IJ SOLN
0.4000 mg | Freq: Once | INTRAMUSCULAR | Status: AC
  Administered 2023-10-06: 0.4 mg via INTRAVENOUS
  Filled 2023-10-06: qty 1

## 2023-10-06 MED ORDER — LACTATED RINGERS IV SOLN: INTRAVENOUS | Status: AC

## 2023-10-06 MED ORDER — IPRATROPIUM-ALBUTEROL 0.5-2.5 (3) MG/3ML IN SOLN
3.0000 mL | Freq: Once | RESPIRATORY_TRACT | Status: AC
  Administered 2023-10-06: 3 mL via RESPIRATORY_TRACT
  Filled 2023-10-06: qty 3

## 2023-10-06 MED ORDER — HYDROCORTISONE SOD SUC (PF) 100 MG IJ SOLR
50.0000 mg | Freq: Every day | INTRAMUSCULAR | Status: DC
  Administered 2023-10-06 – 2023-10-10 (×5): 50 mg via INTRAVENOUS
  Filled 2023-10-06 (×2): qty 1
  Filled 2023-10-06: qty 2
  Filled 2023-10-06 (×2): qty 1

## 2023-10-06 MED ORDER — SODIUM CHLORIDE 0.9 % IV SOLN
500.0000 mg | Freq: Once | INTRAVENOUS | Status: AC
  Administered 2023-10-06: 500 mg via INTRAVENOUS
  Filled 2023-10-06: qty 5

## 2023-10-06 MED ORDER — SODIUM CHLORIDE 0.9 % IV SOLN
1.0000 g | INTRAVENOUS | Status: AC
  Administered 2023-10-06 – 2023-10-09 (×4): 1 g via INTRAVENOUS
  Filled 2023-10-06 (×4): qty 10

## 2023-10-06 NOTE — ED Notes (Signed)
 Donah, MD made aware of patient current status and assessment findings.

## 2023-10-06 NOTE — Plan of Care (Addendum)
 Updated patients son and daughter in law on his condition. During the discussion the family stated that they wanted to change the code status of the patient. They state that during a short period of lucency today the patient voiced that he did not want any surgical interventions if one became necessary. They stated that he had a difficult recovery from a previous CPR intervention. They would like to change him to DNR/DNI.   Arthur [name], MPH, Arthur [name] 10/06/2023 3:03 PM MS4 AI, [facility] pager [phone]   Attempted to confirm code status with the patient, however he continues to wax and wane with his mentation.  Family was not at bedside, therefore called and discussed code status with his son/MPOA Arthur [name].  Confirmed that patient should be DNR/DNI and would like to avoid any life prolonging measures such as feeding tube, surgery, or interventions that would cause him additional pain.  Also reviewed documentation in the chart which reveals that he is DNR/DNI since 01/28/2021.  Agreed to palliative consult to discuss goals of care as well.  Arthur Melena, DO 10/06/23 3:50 PM

## 2023-10-06 NOTE — ED Notes (Signed)
 Patient transported to MRI

## 2023-10-06 NOTE — ED Notes (Signed)
 One time non volume urine occurrence noted. Patient is clean and dry.

## 2023-10-06 NOTE — Assessment & Plan Note (Deleted)
***  Appears to be more of an acute onset in the ED, concerning the patient only responsive to continuous painful stimuli without clear etiology.  Will provide Narcan  given fentanyl  administration but unlikely contributing given timing.  Will obtain VBG to rule out component of hypoxia.  CT head negative but will also obtain MRI brain for further evaluation of central acting etiology.  Will hold centrally acting meds. - MRI brain without contrast negative for any new infarct or other intracranial abnormality that could be a source for the somnolence. - VBG without signs of acidosis or hypercapnia.  - Narcan  x1 - Neurochecks every 4 hour - N.p.o. pending improvement in mentation - MIVF at 100 mL/hr

## 2023-10-06 NOTE — Hospital Course (Signed)
 Arthur [name] is a 83 y.o. male with a PMH of A. Fib, CVA with residual L sided deficits, and primary adenocarcinoma of the lung who was admitted to the [facility] Medicine Teaching Service at [facility] presenting with shortness of breath and emesis. His hospital course is detailed below:  Pneumonia Pt presented with dyspnea and was found on CXR and CT abdomen pelvis to have findings consistent with pneumonia in the LLL. ED started azithromycin  and ceftriaxone  and gave a DuoNeb treatment. We continued CAP coverage for a seven day course.   Somnolence Pt had an acute change in mental status while in the ED. He became somnolent and minimally responsive, only rousing to painful stimuli and not answering most questions. EEG noted moderate diffuse encephalopathy. Course improved throughout admission. At the time of discharge pt was significantly improved and AOx2.  Emesis: Pt presented from acute rehab with nausea and vomiting. He did not have any episodes of emesis throughout admission and did not complain of nausea.  Paroxysmal A-fib Pt developed tachycardia and was found to be in paroxsymal A-fib. No sustained HR above 110. Deferred metoprolol treatment.   Primary Adenocarcinoma of Lung Currently on chemotherapy with Alectinib BID outpatient, held during admission per Dr. Jeannett recommendation.  Increased steroid dose during admission to Solu-Cortef  50 mg daily from Prednisone  10 mg daily outpatient in the setting of acute infection.  Will return to home dose upon discharge.

## 2023-10-06 NOTE — Assessment & Plan Note (Deleted)
 No abdominal pain upon exam and no further episodes of vomiting in the ED.  CTAP showed possible sequelae of chronic pancreatitis unclear if contributing.  Possible component of gastroenteritis given acute onset.  Will monitor closely for recurrence of symptoms. - F/u lipase obtained in the ED***

## 2023-10-06 NOTE — H&P (Addendum)
 Hospital Admission History and Physical Service Pager: [phone]  Patient name: Arthur [name] Medical record number: [id number] Date of Birth: 06/25/1940 Age: 83 y.o. Gender: male  Primary Care Provider: Stoney Blizzard, DO Consultants: None Code Status: Full code, which was confirmed with family if patient unable to confirm Preferred Emergency Contact: Contact Information     Name Relation Home Work Mobile   Bry,Vipul Son [phone]  [phone]      Other Contacts     Name Relation Home Work Mobile   Glauser,[name] Daughter [phone]  [phone]   Jacome,Shital Daughter   [phone]        Chief Complaint: Shortness of breath  Assessment and Plan: Arthur [name] is a 83 y.o. male with PMHx stage IV lung cancer, CVA with residual left-sided deficits, T2DM, DVT/PE on anticoagulation, A-fib, HTN, HLD and GERD presenting with shortness of breath and somnolence.   Differential for patient's shortness of breath includes: Pneumonia: Confirmed on imaging.  Possible component of aspiration given tracheal debris. A-fib with RVR: In A-fib on EKG but currently rate controlled, possibly paroxysmal RVR events. ACS: EKG without ST changes.  Will obtain troponin for definitive rule out. PE: Already on treatment dose anticoagulation, has not missed any doses and without tachycardia.  Less likely  Differential for patient's somnolence includes: Hypoxia: Initially satting well on room air but prior to admission requiring oxygen to maintain oxygen saturation.  Medication induced: Did receive fentanyl  via EMS but was well prior to admission.  No other centrally acting or sedating medications given. CVA: Does have a history of prior CVA but on full dose anticoagulation, less likely and with negative CT head.  Assessment & Plan Pneumonia Imaging consistent with left lower lobe pneumonia, will provide CAP coverage for possible component of aspiration will evaluate further with  SLP particularly in the setting of somnolence as below. - Admitted to FM TS, Dr. Donah attending - Ceftriaxone  azithromycin  - SLP consult - DuoNebs x 1 - Wean oxygen as tolerated - F/u troponin for definitive ACS rule out - F/u blood cultures Somnolence Appears to be more of an acute onset in the ED, concerning the patient only responsive to continuous painful stimuli without clear etiology.  Will provide Narcan  given fentanyl  administration but unlikely contributing given timing.  Will obtain VBG to rule out component of hypoxia.  CT head negative but will also obtain MRI brain for further evaluation of central acting etiology.  Will hold centrally acting meds. - MRI brain without contrast - VBG - Narcan  x1 - Neurochecks every 4 hour - N.p.o. pending improvement in mentation - MIVF at 100 mL/hr Vomiting No abdominal pain upon exam and no further episodes of vomiting in the ED.  CTAP showed possible sequelae of chronic pancreatitis unclear if contributing.  Possible component of gastroenteritis given acute onset.  Will monitor closely for recurrence of symptoms. - F/u lipase obtained in the ED  Chronic and Stable Problems: Chronic left shoulder pain: PT/OT consulted Anemia: Baseline hemoglobin 8-9, 8.7 today.  No signs of bleeding.  Monitor with CBCs. T2DM: A1c 4.9, not currently on therapy.  Will monitor with daily labs. HLD: Hold home rosuvastatin  5 mg daily GERD: Hold home pantoprazole  40 mg daily Anxiety: Hold BuSpar  5 mg daily CAD: Hold Plavix  75 mg daily Stage IV lung cancer: Hold alectinib 600 mg twice daily  FEN/GI: N.p.o. pending improvement in mentation VTE Prophylaxis: Treatment dose Lovenox   Disposition: Progressive  History of Present Illness:  Arthur [name]  is a 83 y.o. male presenting with shortness of breath.  Presented from acute rehab facility due to concern for shortness of breath and vomiting.  Per ED note patient also concerned about left shoulder pain  and shortness of breath upon arrival.  Notably left shoulder pain is chronic with prior MRI and workup.  Upon exam patient with limited communication and requiring continuous painful stimuli to participate.  Denies shortness of breath and abdominal pain.  Limited response to questions with single word answers.  Denies concern with intake and problems with urination and stooling.  Per bedside RN patient initially more interactive but has become progressively more somnolent throughout ED visit.  In the ED, initiated sepsis workup but lab work and vitals otherwise unremarkable.  Obtained CT chest, abdomen and pelvis which is significant for left lower lobe pneumonia, initiated on CAP coverage.  CT head negative but progressively became more somnolent throughout ED stay.  Desatted to 80s on room air, required 2L [place] and paged for admission.  Review Of Systems: Per HPI  Pertinent Past Medical History: T2DM HLD HTN Lung cancer CVA with left sided deficits, wheelchair bound H/o DVT/PE on Lovenox  A-fib Remainder reviewed in history tab.   Pertinent Past Surgical History: Cardiac stent - 2023 Knee surgery - 2005  Remainder reviewed in history tab.   Pertinent Social History: Tobacco use: None Alcohol use: None Other Substance use: None Lives with son and daughter in law (but currently in acute rehab)  Pertinent Family History: Mother - CAD Father - CAD Remainder reviewed in history tab.   Important Outpatient Medications: Alectinib 600mg  BID Baclofen  10mg  BID prn (not taking daily) Buspirone  5mg  BID Plavix  75mg  daily Lovenox  70mg  q12h Pantoprazole  40mg  daily Prednisone  10mg  daily (for appetite) Crestor  5mg  daily Iron  PO Remainder reviewed in medication history.   Objective: BP 111/61   Pulse 96   Temp 97.8 F (36.6 C) (Rectal)   Resp 11   Wt 62.5 kg   SpO2 99%   BMI 19.22 kg/m  Exam: General: Initially resting with eyes closed and mouth open and only arouses to  painful stimuli.  Required prolonged painful stimuli to open his eyes and participate in conversation.  NAD Eyes: Pupils sluggish but reactive bilaterally. ENTM: Dry lips and oral mucosa Neck: Supple, non-tender Cardiovascular: Irregularly irregular rhythm.  HR in 100s.  No murmur. Respiratory: Normal work of breathing on 2L [place].  Diminished breath sounds in bilateral bases. Gastrointestinal: Soft, nontender, nondistended.  Some groaning with palpation but denied pain. MSK: No peripheral edema. Derm: Warm, dry, no rashes noted Neuro: Alert to self and place.  Some nonsensical speech.  Assessment of cranial nerves limited by mentation.  5/5 grip strength bilaterally.  Able to lift right lower extremity off the bed but does not hold left lower extremity off the bed Psych: Somnolent, only responsive to painful stimuli and closes eyes if not continuously prompted  Labs:  CBC BMET  Recent Labs  Lab 10/05/23 2304 10/05/23 2310  WBC 9.8  --   HGB 8.7* 8.5*  HCT 26.3* 25.0*  PLT 425*  --    Recent Labs  Lab 10/05/23 2304 10/05/23 2310  NA 131* 134*  K 5.1 4.5  CL 98 98  CO2 26  --   BUN 16 15  CREATININE 1.22 1.20  GLUCOSE 188* 189*  CALCIUM  7.7*  --     Pertinent additional labs: Lactic acid: 0.5 INR: 1.1 Glucose: 189 Quad screen: Negative UA: Negative  EKG: A-fib, regular  rate   Imaging Studies Performed: CT CHEST ABDOMEN PELVIS WO CONTRAST Result Date: 10/06/2023 EXAM: CT CHEST, ABDOMEN AND PELVIS WITHOUT CONTRAST 10/05/2023 11:45:17 PM TECHNIQUE: CT of the chest, abdomen and pelvis was performed without the administration of intravenous contrast. Multiplanar reformatted images are provided for review. Automated exposure control, iterative reconstruction, and/or weight based adjustment of the mA/kV was utilized to reduce the radiation dose to as low as reasonably achievable. COMPARISON: Comparison with same day chest radiographs; CT abdomen and pelvis 05/27/2023 and CT chest  05/27/2023 CLINICAL HISTORY: Sepsis. FINDINGS: CHEST: MEDIASTINUM AND LYMPH NODES: Small pericardial effusion. Coronary artery and aortic atherosclerotic calcifications. Coronary stenting. No mediastinal, hilar or axillary lymphadenopathy. LUNGS AND PLEURA: Wetting debris in the trachea. Bronchial wall thickening in the left lower lobe with patchy central lobular ground-glass nodules. Small left and trace right pleural effusions. No pneumothorax. ABDOMEN AND PELVIS: LIVER: The liver is unremarkable. GALLBLADDER AND BILE DUCTS: Cholelithiasis. No evidence of acute cholecystitis. No biliary dilation. SPLEEN: No acute abnormality. PANCREAS: Coarse calcifications in the pancreas compatible with sequelae of chronic pancreatitis. Similar dilation of the main pancreatic duct measuring up to 9 mm. ADRENAL GLANDS: No acute abnormality. KIDNEYS, URETERS AND BLADDER: Nonobstructing stone in the right kidney. No hydronephrosis. No perinephric or periureteral stranding. Urinary bladder is unremarkable. GI AND BOWEL: Stomach demonstrates no acute abnormality. There is no bowel obstruction. PERITONEUM AND RETROPERITONEUM: No ascites. No free air. VASCULATURE: Normal caliber thoracic aorta. Aortic atherosclerotic calcification. IVC filter. ABDOMINAL AND PELVIS LYMPH NODES: No lymphadenopathy. REPRODUCTIVE ORGANS: No acute abnormality. BONES AND SOFT TISSUES: Heterogeneous enlargement of the right subscapularis suggestive of intramuscular hematoma. This measures approximately 8.1 x 3.8 cm. No acute fracture. Remote left rib fractures. Nodular thickening of the anterior soft tissues with subcutaneous gas likely due to injections. IMPRESSION: 1. Infiltrates in the left lower lobe compatible with pneumonia/aspiration. 2. Small left and trace right pleural effusions. 3. Heterogeneous enlargement of the right subscapularis suggestive of intramuscular hematoma, measuring approximately 8.1 x 3.8 cm. 4. Cholelithiasis without evidence of  acute cholecystitis or biliary dilation. 5. Coarse calcifications in the pancreas compatible with sequelae of chronic pancreatitis. Electronically signed by: Norman Gatlin MD 10/06/2023 12:00 AM EDT RP Workstation: [id number]   CT Head Wo Contrast Result Date: 10/05/2023 CLINICAL DATA:  Dizziness EXAM: CT HEAD WITHOUT CONTRAST TECHNIQUE: Contiguous axial images were obtained from the base of the skull through the vertex without intravenous contrast. RADIATION DOSE REDUCTION: This exam was performed according to the departmental dose-optimization program which includes automated exposure control, adjustment of the mA and/or kV according to patient size and/or use of iterative reconstruction technique. COMPARISON:  09/14/2023 FINDINGS: Brain: Stable left occipital encephalomalacia consistent with prior infarct. Chronic small vessel ischemic changes are seen throughout the periventricular white matter, stable. No evidence of acute infarct or hemorrhage. Lateral ventricles and midline structures are unremarkable. No acute extra-axial fluid collections. No mass effect. Vascular: No hyperdense vessel or unexpected calcification. Stable atherosclerosis. Skull: Normal. Negative for fracture or focal lesion. Sinuses/Orbits: No acute finding. Other: None. IMPRESSION: 1. Stable head CT, no acute intracranial process. Electronically Signed   By: Ozell Daring M.D.   On: 10/05/2023 23:48   DG Shoulder Left Portable Result Date: 10/05/2023 EXAM: XRAY OF THE LEFT SHOULDER 10/05/2023 11:24:00 PM COMPARISON: 09/14/2023 CLINICAL HISTORY: Questionable sepsis - evaluate for abnormality. FINDINGS: BONES AND JOINTS: Glenohumeral joint is normally aligned. No acute fracture or dislocation. Mild degenerative changes of the acromioclavicular joint. Old healed left rib fractures. SOFT TISSUES:  No abnormal calcifications. Visualized lung is unremarkable. IMPRESSION: 1. No acute findings. Electronically signed by: Norman Gatlin MD  10/05/2023 11:32 PM EDT RP Workstation: [id number]   DG Chest Port 1 View Result Date: 10/05/2023 EXAM: 1 VIEW XRAY OF THE CHEST 10/05/2023 11:24:00 PM COMPARISON: 11/16/2021 CLINICAL HISTORY: Questionable sepsis - evaluate for abnormality. FINDINGS: LUNGS AND PLEURA: No focal pulmonary opacity. No pulmonary edema. No pleural effusion. No pneumothorax. HEART AND MEDIASTINUM: Stable cardiomediastinal silhouette. Aortic atherosclerotic calcification. Coronary stenting. BONES AND SOFT TISSUES: No acute osseous abnormality. IMPRESSION: 1. No acute findings. Electronically signed by: Norman Gatlin MD 10/05/2023 11:31 PM EDT RP Workstation: [id number]      Theophilus Pagan, MD 10/06/2023, 1:18 AM PGY-3, [facility]  FPTS Intern pager: [phone], text pages welcome Secure chat group [facility] Teaching Service

## 2023-10-06 NOTE — Assessment & Plan Note (Deleted)
 Imaging consistent with left lower lobe pneumonia, will provide CAP coverage for possible component of aspiration will evaluate further with SLP particularly in the setting of somnolence as below. - Admitted to FM TS, Dr. Donah attending - Ceftriaxone  1 g daily and azithromycin  500 mg daily x5 days (last dose 9/12) - SLP consulted, appreciate recs - DuoNebs x 1 - Wean oxygen as tolerated - F/u troponin for definitive ACS rule out - F/u blood cultures

## 2023-10-06 NOTE — Evaluation (Signed)
 Clinical/Bedside Swallow Evaluation Patient Details  Name: SHYHIEM BEENEY MRN: [id number] Date of Birth: July 06, 1940  Today's Date: 10/06/2023 Time: SLP Start Time (ACUTE ONLY): 1353 SLP Stop Time (ACUTE ONLY): 1404 SLP Time Calculation (min) (ACUTE ONLY): 11 min  Past Medical History:  Past Medical History:  Diagnosis Date   Diabetes mellitus without complication (HCC)    High cholesterol    Hypertension    lung ca    Normal nuclear stress test 01/29/2008   stress perfusion study apparently in 2010 in [facility] which he said was negative.   Past Surgical History:  Past Surgical History:  Procedure Laterality Date   BIOPSY  10/17/2022   Procedure: BIOPSY;  Surgeon: Stacia Glendia BRAVO, MD;  Location: [facility] ENDOSCOPY;  Service: Gastroenterology;;   CATARACT EXTRACTION, BILATERAL  2006   CORONARY STENT INTERVENTION N/A 02/06/2021   Procedure: CORONARY STENT INTERVENTION;  Surgeon: Dann Candyce RAMAN, MD;  Location: [facility] INVASIVE CV LAB;  Service: Cardiovascular;  Laterality: N/A;   CORONARY ULTRASOUND/IVUS N/A 02/06/2021   Procedure: Intravascular Ultrasound/IVUS;  Surgeon: Dann Candyce RAMAN, MD;  Location: [facility] INVASIVE CV LAB;  Service: Cardiovascular;  Laterality: N/A;   ESOPHAGOGASTRODUODENOSCOPY N/A 10/17/2022   Procedure: ESOPHAGOGASTRODUODENOSCOPY (EGD);  Surgeon: Stacia Glendia BRAVO, MD;  Location: [facility] ENDOSCOPY;  Service: Gastroenterology;  Laterality: N/A;   FINE NEEDLE ASPIRATION  03/21/2021   Procedure: FINE NEEDLE ASPIRATION;  Surgeon: Brenna Adine CROME, DO;  Location: [facility] ENDOSCOPY;  Service: Pulmonary;;   IR RADIOLOGIST EVAL & MGMT  12/24/2021   KNEE SURGERY  2005   LEFT HEART CATH AND CORONARY ANGIOGRAPHY N/A 02/06/2021   Procedure: LEFT HEART CATH AND CORONARY ANGIOGRAPHY;  Surgeon: Dann Candyce RAMAN, MD;  Location: [facility] INVASIVE CV LAB;  Service: Cardiovascular;  Laterality: N/A;   VIDEO BRONCHOSCOPY WITH ENDOBRONCHIAL ULTRASOUND Bilateral 03/21/2021   Procedure:  VIDEO BRONCHOSCOPY WITH ENDOBRONCHIAL ULTRASOUND;  Surgeon: Brenna Adine CROME, DO;  Location: [facility] ENDOSCOPY;  Service: Pulmonary;  Laterality: Bilateral;   HPI:  Daishon R Jayson is an 83 y.o. male who presented to ED on 9/7 due to SOB and somnolence. Imaging concerning for CAP; notable was tracheal debris. Recent admission 8/17-8/26/25 for rotator cuff tear, hypotension, decreased oral intake. PMH Diabetes, hyperlipidemia, hypertension, GERD, PE, CVA, stage IV adenocarcinoma of the lung, DVT, paroxysmal A-fib and duodenal ulcer. Clinical swallowing evaluation on 09/14/23 revealed concerns for primary esophageal dysphagia and recs for esophageal w/u.  Family at bedside reports frequent coughing with PO intake.    Assessment / Plan / Recommendation  Clinical Impression  Pt presented today with mild subjective signs of dysphagia, specific to multiple sub-swallows with pureed boluses and visible effort noted when drinking. There was no coughing, however his son and daughter-in-law describe ongoing choking/coughing when eating meals.  He was assessed by SLP during recent admission with suspicion for a primary esophageal dysphagia. Given hx and family reports, as well as tracheal debris per CT, recommend proceeding with MBS next date to assess pharyngeal physiology and determine if further esophageal w/u is needed. D/W medical student and family at bedside.  Allow full liquids pending study tomorrow.   SLP Visit Diagnosis: Dysphagia, unspecified (R13.10)    Aspiration Risk    unknown   Diet Recommendation   Other (Comment) (full liquids)  Medication Administration: Whole meds with liquid    Other  Recommendations Oral Care Recommendations: Oral care BID       Swallow Study   General Date of Onset: 10/05/23 HPI: Fausto R Jean is an  83 y.o. male who presented to ED on 9/7 due to SOB and somnolence. Imaging concerning for CAP; notable was tracheal debris. Recent admission 8/17-8/26/25 for rotator  cuff tear, hypotension, decreased oral intake. PMH Diabetes, hyperlipidemia, hypertension, GERD, PE, CVA, stage IV adenocarcinoma of the lung, DVT, paroxysmal A-fib and duodenal ulcer. Clinical swallowing evaluation on 09/14/23 revealed concerns for primary esophageal dysphagia and recs for esophageal w/u.  Family at bedside reports frequent coughing with PO intake. Type of Study: Bedside Swallow Evaluation Previous Swallow Assessment: see HPI Diet Prior to this Study: NPO Temperature Spikes Noted: No Respiratory Status: Room air History of Recent Intubation: No Behavior/Cognition: Alert;Cooperative;Pleasant mood Oral Cavity Assessment: Within Functional Limits Oral Care Completed by SLP: No Oral Cavity - Dentition: Adequate natural dentition Vision: Functional for self-feeding Self-Feeding Abilities: Needs assist Patient Positioning: Upright in bed Baseline Vocal Quality: Normal Volitional Cough: Strong Volitional Swallow: Able to elicit    Oral/Motor/Sensory Function Overall Oral Motor/Sensory Function: Within functional limits   Ice Chips Ice chips: Not tested   Thin Liquid Thin Liquid: Within functional limits    Nectar Thick Nectar Thick Liquid: Not tested   Honey Thick Honey Thick Liquid: Not tested   Puree Puree: Impaired Pharyngeal Phase Impairments: Multiple swallows   Solid     Solid: Not tested      Vona Palma Laurice 10/06/2023,2:20 PM  Palma L. Vona, MA CCC/SLP Clinical Specialist - Acute Care SLP Acute Rehabilitation Services Office number [phone]

## 2023-10-06 NOTE — Plan of Care (Signed)
 FMTS Interim Progress Note  S:Pt is difficult to rouse. When awaked with sternal rub he can keep his eyes open but does not focus on the provider, and does not answer questions verbally. Will occasionally shake head no when asked direct questions, but not consistently.   O: BP (!) 113/52   Pulse (!) 104   Temp 98.2 F (36.8 C) (Oral)   Resp 14   Wt 62.5 kg   SpO2 99%   BMI 19.22 kg/m   General: Pt ill appearing, not responding to questions. Not attentive. Cardiac: regular rate and rhythm Respiratory: difficult to assess given patient's position and not following commands.  Good air movement diffusely in R lobes. Good air movement in L apex. Decreased breath sounds in remainder of L lung fields.  Abdomen: No tenderness to palpation appreciable. No obvious wincing from palpation. Pt shook his head no when asked if he felt pain during exam. Extremities: No edema noted.   A/P: Somnolence Pt inattentive, not answering questions or following commands. This is an acute change from presentation to the ED and from baseline from what we can tell, though the history was limited on presentation. MRI showed no evidence of acute intracranial process. Narcan  provided no improvement. No signs of acidemia.  - EEG ordered and found diffuse encephalopathy.  - neuro checks every 4 hours - ammonia check - legionella check  Pneumonia - f/u troponin  - f/u blood cultures - On RA from 2L, satting well.  - Ceftriaxone  1 g daily and azithromycin  500 mg daily x5 days (last dose 9/12) and reassess for possible extension of course - solu-cortef  for stress dosing  Vomiting No episodes of vomiting since admission. Lipase 12.  - Will continue to watch for sxs   Casimir, Marsa DASEN, Medical Student 10/06/2023, 8:33 AM MS4 AI, [facility] pager [phone]  I have discussed the above with Student Doctor Casimir and agree with the documented plan. My edits for  correction/addition/clarification are included above. Please see any attending notes.   Kathrine Melena, DO PGY-2,  Family Medicine 10/06/2023 3:58 PM

## 2023-10-06 NOTE — Progress Notes (Signed)
 EEG complete - results pending

## 2023-10-06 NOTE — ED Notes (Signed)
 PT/OT at bedside stating they will try again for assessment when patient status improves.

## 2023-10-06 NOTE — Assessment & Plan Note (Signed)
 No abdominal pain upon exam and no further episodes of vomiting in the ED.  CTAP showed possible sequelae of chronic pancreatitis unclear if contributing.  Possible component of gastroenteritis given acute onset.  Will monitor closely for recurrence of symptoms. - F/u lipase obtained in the ED***

## 2023-10-06 NOTE — Progress Notes (Signed)
 PT Cancellation Note  Patient Details Name: RONNIE DOO MRN: [id number] DOB: 07/29/1940   Cancelled Treatment:    Reason Eval/Treat Not Completed: [name] level of consciousness. Currently pt is somnolent. Will follow up as able and appropriate.   Dorothyann Maier, DPT, CLT  Acute Rehabilitation Services Office: [phone] (Secure chat preferred)    Dorothyann VEAR Maier 10/06/2023, 11:47 AM

## 2023-10-06 NOTE — Assessment & Plan Note (Signed)
 Imaging consistent with left lower lobe pneumonia, will provide CAP coverage for possible component of aspiration will evaluate further with SLP particularly in the setting of somnolence as below. - Admitted to FM TS, Dr. Donah attending - Ceftriaxone  azithromycin  - SLP consult - DuoNebs x 1 - Wean oxygen as tolerated - F/u troponin for definitive ACS rule out - F/u blood cultures

## 2023-10-06 NOTE — ED Notes (Signed)
 Janna, DO at bedside.

## 2023-10-06 NOTE — Progress Notes (Signed)
 OT Cancellation Note  Patient Details Name: Arthur [name] MRN: [id number] DOB: 06/08/1940   Cancelled Treatment:    Reason Eval/Treat Not Completed: [name] level of consciousness (Discussed with RN, pt is somnolent and not appropriate for therapy eval at this time.)  [name] 10/06/2023, 11:27 AM

## 2023-10-06 NOTE — ED Notes (Signed)
 Pt currently in MRI.Arthur [name]

## 2023-10-06 NOTE — Progress Notes (Signed)
 SLP Cancellation Note  Patient Details Name: Arthur [name] MRN: [id number] DOB: [birth year]   Cancelled treatment:       Reason Eval/Treat Not Completed: Fatigue/lethargy limiting ability to participate. Per RN, pt not alert enough to attempt swallow eval yet. Will continue to follow.     Leita SAILOR., M.A. CCC-SLP Acute Rehabilitation Services Office: [phone]  Secure chat preferred  10/06/2023, 9:37 AM

## 2023-10-06 NOTE — Plan of Care (Addendum)
 FMTS Interim Progress Note  S: Saw patient at bedside and he was somnolent but arousable to voice and responsive. He denied any abdominal pain, nausea, or vomiting, does report that his shoulder still hurts him.  He was mostly nodding yes or no to questions, only spoke to answer A&O questions.  Nursing stated that family brought home chemotherapy medications and shared that we will wait for outpatient oncology recommendations before resuming.  O: BP 133/61 (BP Location: Right Arm)   Pulse (!) 119   Temp 98 F (36.7 C) (Oral)   Resp 19   Wt 62.5 kg   SpO2 92%   BMI 19.22 kg/m   General: Patient was somnolent but arousable to voice, frail Cardiovascular: Irregular rate, irregular rhythm, tachycardic, no murmurs rubs or gallops, cap refill less than 2 Lungs: CTA bilaterally, stable on 2 L [place], nasal cannula not placed in the nares and was adjusted Neurologic: A&O x 2, not responsive to time questions.  A/P: Pneumonia Found on chest x-ray and chest CT, currently on CAP therapy -Continue Rocephin /azithromycin  -Continue 100 mL/h maintenance fluids -Continue to wean oxygen as needed -Pending modified barium swallow as per SLP consult -Solu-Cortef  for stress dosing  Moderate diffuse encephalopathy Waxing waning presentation, unclear etiology given largely unremarkable labs and MRI/head CT, EEG done 9/8 and assumed to be toxic metabolic etiology.  Alectinib has been associated with hepatotoxicity, but LFTs and bili's have been normal and patient has been on treatment for a while.  Albumin  corrected calcium  was normal, no recent serum magnesium . -Added HIV, RPR, vitamin B12, and mag to a.m. labs have not been drawn in a long time and history of B12 deficiency -Consider neuro consult  Paroxysmal A-fib History of A-fib in the past, not currently on rate or rhythm control therapy, and A-fib on exam and tachycardic. -EKG ordered, will consider adding metoprolol or diltiazem if in  RVR   Lorrane Pac, MD 10/06/2023, 10:42 PM PGY-1, [facility] Family Medicine Service pager [phone]

## 2023-10-06 NOTE — Procedures (Signed)
 Patient Name: Arthur [name]  MRN: [id number]  Epilepsy Attending: Arlin MALVA Krebs  Referring Physician/Provider: Janna Ferrier, DO  Date: 10/06/2023 Duration: 25.09 mins  Patient history: 83yo M with ams. EEG to evaluate for seizure  Level of alertness: Awake  AEDs during EEG study: None  Technical aspects: This EEG study was done with scalp electrodes positioned according to the 10-20 International system of electrode placement. Electrical activity was reviewed with band pass filter of 1-70Hz , sensitivity of 7 uV/mm, display speed of 82mm/sec with a 60Hz  notched filter applied as appropriate. EEG data were recorded continuously and digitally stored.  Video monitoring was available and reviewed as appropriate.  Description: EEG showed continuous generalized 3 to 6 Hz theta-delta slowing. Generalized periodic discharges with triphasic morphology at 1 Hz, more prominent when awake/stimulated were also noted. Hyperventilation and photic stimulation were not performed.     ABNORMALITY - Periodic discharges with triphasic morphology, generalized ( GPDs) - Continuous slow, generalized  IMPRESSION: This study is suggestive of moderate diffuse encephalopathy, likely related to toxic-metabolic etiology. No seizures or definite epileptiform discharges were seen throughout the recording.  Demiana Crumbley O Jalyne Brodzinski

## 2023-10-06 NOTE — Assessment & Plan Note (Addendum)
 Appears to be more of an acute onset in the ED, concerning the patient only responsive to continuous painful stimuli without clear etiology.  Will provide Narcan  given fentanyl  administration but unlikely contributing given timing.  Will obtain VBG to rule out component of hypoxia.  CT head negative but will also obtain MRI brain for further evaluation of central acting etiology.  Will hold centrally acting meds. - MRI brain without contrast - VBG - Narcan  x1 - Neurochecks every 4 hour - N.p.o. pending improvement in mentation - MIVF at 100 mL/hr

## 2023-10-07 ENCOUNTER — Inpatient Hospital Stay: Payer: Self-pay | Admitting: Family Medicine

## 2023-10-07 ENCOUNTER — Inpatient Hospital Stay (HOSPITAL_COMMUNITY)

## 2023-10-07 DIAGNOSIS — J189 Pneumonia, unspecified organism: Secondary | ICD-10-CM | POA: Diagnosis not present

## 2023-10-07 DIAGNOSIS — C3490 Malignant neoplasm of unspecified part of unspecified bronchus or lung: Secondary | ICD-10-CM | POA: Diagnosis not present

## 2023-10-07 DIAGNOSIS — Z515 Encounter for palliative care: Secondary | ICD-10-CM

## 2023-10-07 DIAGNOSIS — Z7189 Other specified counseling: Secondary | ICD-10-CM | POA: Diagnosis not present

## 2023-10-07 LAB — CBC WITH DIFFERENTIAL/PLATELET
Abs Immature Granulocytes: 0.06 K/uL (ref 0.00–0.07)
Basophils Absolute: 0 K/uL (ref 0.0–0.1)
Basophils Relative: 0 %
Eosinophils Absolute: 0 K/uL (ref 0.0–0.5)
Eosinophils Relative: 0 %
HCT: 26.8 % — ABNORMAL LOW (ref 39.0–52.0)
Hemoglobin: 9.3 g/dL — ABNORMAL LOW (ref 13.0–17.0)
Immature Granulocytes: 1 %
Lymphocytes Relative: 8 %
Lymphs Abs: 0.8 K/uL (ref 0.7–4.0)
MCH: 29.3 pg (ref 26.0–34.0)
MCHC: 34.7 g/dL (ref 30.0–36.0)
MCV: 84.5 fL (ref 80.0–100.0)
Monocytes Absolute: 0.5 K/uL (ref 0.1–1.0)
Monocytes Relative: 5 %
Neutro Abs: 9.4 K/uL — ABNORMAL HIGH (ref 1.7–7.7)
Neutrophils Relative %: 86 %
Platelets: 426 K/uL — ABNORMAL HIGH (ref 150–400)
RBC: 3.17 MIL/uL — ABNORMAL LOW (ref 4.22–5.81)
RDW: 21.1 % — ABNORMAL HIGH (ref 11.5–15.5)
WBC: 10.8 K/uL — ABNORMAL HIGH (ref 4.0–10.5)
nRBC: 0 % (ref 0.0–0.2)

## 2023-10-07 LAB — COMPREHENSIVE METABOLIC PANEL WITH GFR
ALT: 14 U/L (ref 0–44)
AST: 12 U/L — ABNORMAL LOW (ref 15–41)
Albumin: 1.6 g/dL — ABNORMAL LOW (ref 3.5–5.0)
Alkaline Phosphatase: 60 U/L (ref 38–126)
Anion gap: 11 (ref 5–15)
BUN: 13 mg/dL (ref 8–23)
CO2: 22 mmol/L (ref 22–32)
Calcium: 7.9 mg/dL — ABNORMAL LOW (ref 8.9–10.3)
Chloride: 105 mmol/L (ref 98–111)
Creatinine, Ser: 0.97 mg/dL (ref 0.61–1.24)
GFR, Estimated: >60 mL/min (ref >60)
Glucose, Bld: 106 mg/dL — ABNORMAL HIGH (ref 70–99)
Potassium: 4.1 mmol/L (ref 3.5–5.1)
Sodium: 138 mmol/L (ref 135–145)
Total Bilirubin: 1.3 mg/dL — ABNORMAL HIGH (ref 0.0–1.2)
Total Protein: 4.7 g/dL — ABNORMAL LOW (ref 6.5–8.1)

## 2023-10-07 LAB — RPR: RPR Ser Ql: NONREACTIVE

## 2023-10-07 LAB — MAGNESIUM: Magnesium: 1.4 mg/dL — ABNORMAL LOW (ref 1.7–2.4)

## 2023-10-07 LAB — VITAMIN B12: Vitamin B-12: 481 pg/mL (ref 180–914)

## 2023-10-07 LAB — HIV ANTIBODY (ROUTINE TESTING W REFLEX): HIV Screen 4th Generation wRfx: NONREACTIVE

## 2023-10-07 MED ORDER — BUSPIRONE HCL 10 MG PO TABS
5.0000 mg | ORAL_TABLET | Freq: Two times a day (BID) | ORAL | Status: DC
  Administered 2023-10-07 – 2023-10-10 (×6): 5 mg via ORAL
  Filled 2023-10-07 (×6): qty 1

## 2023-10-07 MED ORDER — LIDOCAINE 4 % EX PTCH: 1.0000 | MEDICATED_PATCH | Freq: Every day | CUTANEOUS | Status: DC | PRN

## 2023-10-07 MED ORDER — PANTOPRAZOLE SODIUM 40 MG PO TBEC
40.0000 mg | DELAYED_RELEASE_TABLET | Freq: Two times a day (BID) | ORAL | Status: DC
  Administered 2023-10-07 – 2023-10-10 (×6): 40 mg via ORAL
  Filled 2023-10-07 (×6): qty 1

## 2023-10-07 MED ORDER — ORAL CARE MOUTH RINSE: 15.0000 mL | OROMUCOSAL | Status: DC | PRN

## 2023-10-07 MED ORDER — OXYCODONE HCL 5 MG PO TABS
5.0000 mg | ORAL_TABLET | Freq: Four times a day (QID) | ORAL | Status: DC | PRN
  Administered 2023-10-09: 5 mg via ORAL
  Filled 2023-10-07 (×2): qty 1

## 2023-10-07 MED ORDER — DICLOFENAC SODIUM 1 % EX GEL
2.0000 g | CUTANEOUS | Status: DC | PRN
  Administered 2023-10-09: 2 g via TOPICAL
  Filled 2023-10-07 (×2): qty 100

## 2023-10-07 MED ORDER — ACETAMINOPHEN 325 MG PO TABS
650.0000 mg | ORAL_TABLET | Freq: Four times a day (QID) | ORAL | Status: DC | PRN
  Administered 2023-10-07 – 2023-10-08 (×2): 650 mg via ORAL
  Filled 2023-10-07 (×2): qty 2

## 2023-10-07 MED ORDER — CLOPIDOGREL BISULFATE 75 MG PO TABS
75.0000 mg | ORAL_TABLET | Freq: Every day | ORAL | Status: DC
  Administered 2023-10-08 – 2023-10-10 (×3): 75 mg via ORAL
  Filled 2023-10-07 (×3): qty 1

## 2023-10-07 MED ORDER — LIDOCAINE 5 % EX PTCH
1.0000 | MEDICATED_PATCH | Freq: Every day | CUTANEOUS | Status: DC | PRN
  Administered 2023-10-08: 1 via TRANSDERMAL
  Filled 2023-10-07: qty 1

## 2023-10-07 MED ORDER — SENNA 8.6 MG PO TABS: 1.0000 | ORAL_TABLET | Freq: Every day | ORAL | Status: DC | PRN

## 2023-10-07 MED ORDER — ROSUVASTATIN CALCIUM 5 MG PO TABS
5.0000 mg | ORAL_TABLET | Freq: Every day | ORAL | Status: DC
  Administered 2023-10-07 – 2023-10-09 (×3): 5 mg via ORAL
  Filled 2023-10-07 (×3): qty 1

## 2023-10-07 MED ORDER — WHITE PETROLATUM EX OINT
TOPICAL_OINTMENT | CUTANEOUS | Status: DC | PRN
  Filled 2023-10-07: qty 28.35

## 2023-10-07 MED ORDER — MUSCLE RUB 10-15 % EX CREA
TOPICAL_CREAM | CUTANEOUS | Status: DC | PRN
  Filled 2023-10-07: qty 85

## 2023-10-07 MED ORDER — MAGNESIUM SULFATE 2 GM/50ML IV SOLN
2.0000 g | Freq: Once | INTRAVENOUS | Status: AC
  Administered 2023-10-07: 2 g via INTRAVENOUS
  Filled 2023-10-07: qty 50

## 2023-10-07 NOTE — Assessment & Plan Note (Addendum)
 Pt found to be tachycardic overnight 9/8 and EKG showed A fib.  - monitor closely and will add metoprolol if HR sustained over 110 bpm

## 2023-10-07 NOTE — Progress Notes (Signed)
 Prior-To-Admission Oral Chemotherapy for Treatment of Oncologic Disease   Order noted from Dr. Kathrine Melena to continue prior-to-admission oral chemotherapy regimen of alectinib.  Procedure Per Pharmacy & Therapeutics Committee Policy: Orders for continuation of home oral chemotherapy for treatment of an oncologic disease will be held unless approved by an oncologist during current admission.    For patients receiving oncology care at [facility], inpatient pharmacist contacts patient's oncologist during regular office hours to review. If earlier review is medically necessary, attending physician consults [facility] on-call oncologist   For patients receiving oncology care outside of [facility], attending physician consults patient's oncologist to review. If this oncologist or their coverage cannot be reached, attending physician consults [facility] on-call oncologist   Oral chemotherapy continuation order is on hold pending oncologist review, [facility] oncologist Dr Sherrod will be notified by inpatient pharmacy during office hours - hold while inpatient and resume on discharge per Dr Sherrod  Thank you for involving pharmacy in this patient's care.  Delon Sax, PharmD, BCPS Clinical Pharmacist Clinical phone for 10/07/2023 is [phone] 10/07/2023 8:17 AM

## 2023-10-07 NOTE — Plan of Care (Signed)
 FMTS Interim Progress Note  S: Saw patient at bedside, was awake and alert with daughter.  He was far more conversant and excited to see us .  Explained the uncertain etiology of his encephalopathy to the daughter and she reports that there were no concerns with extra dosing or incidental medications in his regimen.   Daughter was concerned that [place] rehab may have overworked his shoulder and done more harm than good, was asking if he was still a rehab candidate.  Still has a dry cough, no mucus production, but is coughing less when eating.  He denied any pain, fever, nausea, vomiting.   O: BP 114/67 (BP Location: Right Arm)   Pulse 90   Temp 97.7 F (36.5 C) (Oral)   Resp 19   Wt 62.5 kg   SpO2 96%   BMI 19.22 kg/m    General: A&O x 4, joking with daughter Cardiac: Regular rate and rhythm. Normal S1/S2. No murmurs, rubs, or gallops appreciated. Lungs: Clear bilaterally to ascultation.  Abdomen: Normoactive bowel sounds. No tenderness to deep or light palpation. No rebound or guarding.    Psych: Pleasant and appropriate    A/P: Pneumonia Clinically improving today. -Continue antibiotic course to complete treatment. -Passed modified barium swallow for dysphagia to diet. -BC, NGTD -Will resume p.o. home meds, holding alectinib until DC as per oncologist Dr. Sherrod  Toxic encephalopathy Drastically improved today.  Etiology still uncertain, daughter states that he is back to his baseline.  May have been secondary to acute viral infection such as gastroenteritis or hypoxia 2/2 pneumonia. -HIV//RPR negative, B12 481, magnesium  1.4 and was repleted this a.m. -Continue to monitor, if stable for > 4 hours.  Consider DC to SNF or home with home health.  Lorrane Pac, MD 10/07/2023, 9:44 PM PGY-1, [facility] Family Medicine Service pager [phone]

## 2023-10-07 NOTE — Progress Notes (Signed)
 Modified Barium Swallow Study  Patient Details  Name: MALIK PAAR MRN: [id number] Date of Birth: [birth year]  Today's Date: 10/07/2023  Modified Barium Swallow completed.  Full report located under Chart Review in the Imaging Section.  History of Present Illness Ladarrell R Jandreau is an 83 y.o. male who presented to ED on 9/7 due to SOB and somnolence. Imaging concerning for CAP; notable was tracheal debris. Recent admission 8/17-8/26/25 for rotator cuff tear, hypotension, decreased oral intake. PMH Diabetes, hyperlipidemia, hypertension, GERD, PE, CVA, stage IV adenocarcinoma of the lung, DVT, paroxysmal A-fib and duodenal ulcer. Clinical swallowing evaluation on 09/14/23 revealed concerns for primary esophageal dysphagia and recs for esophageal w/u.  Family at bedside reports frequent coughing with PO intake.   Clinical Impression Pt demonstrates a severe oropharyngeal dysphagia with silent aspiration of liquids and solid particulate matter. Pt has decreased hyoid excursion and epiglottic deflection with severe pharyngeal residue in the vallcuele and pyriform sinuses. There are instances of silent aspiration of residue, particularly with nectar thick liquids. Presence of suspected ostephyte at C6/7 also impacted clearance of bolus through PES with mild backflow. Pt had increased epiglottic deflection in a chin tucked position and could clear otherwise persistent vallecular solid residue with a liquid wash in a chin tucked position. Pt needed lots of instruction to follow this, but with practice and reinforcement from family a chin tuck could be a beneficial adaptive strategy. Thickened liquids are not effective. Pt would benefit from softer, moister solids. Recommend a minced and moist diet (IDDSI 5/Dys 2) with thin liquids with a chin tuck and following solids with liquids. Pt would benefit from f/u with SLP in next setting. to learn strategy and start RMT and pharyngeal strengthening. Already  provided some education to family including daughter in law.  DIGEST Swallow Severity Rating*  Safety: 2  Efficiency:3  Overall Pharyngeal Swallow Severity: 3 1: mild; 2: moderate; 3: severe; 4: profound  *The Dynamic Imaging Grade of Swallowing Toxicity is standardized for the head and neck cancer population, however, demonstrates promising clinical applications across populations to standardize the clinical rating of pharyngeal swallow safety and severity.  Factors that may increase risk of adverse event in presence of aspiration Noe & Lianne 2021): Aspiration of thick, dense, and/or acidic materials  Swallow Evaluation Recommendations Recommendations: PO diet PO Diet Recommendation: Dysphagia 2 (Finely chopped);Thin liquids (Level 0) Liquid Administration via: Cup;Straw Medication Administration: Crushed with puree Supervision: Staff to assist with self-feeding Swallowing strategies  : Slow rate;Small bites/sips;Chin tuck;Follow solids with liquids Postural changes: Stay upright 30-60 min after meals Oral care recommendations: Oral care BID (2x/day)   Consuelo Fort, MA CCC-SLP  Acute Rehabilitation Services Secure Chat Preferred Office [phone]    [place] 10/07/2023,2:42 PM

## 2023-10-07 NOTE — Consult Note (Signed)
 Consultation Note Date: 10/07/2023   Patient Name: Arthur [name]  DOB: 10/11/1940  MRN: [id number]  Age / Sex: 83 y.o., male  PCP: Stoney Blizzard, DO Referring Physician: Donah Laymon PARAS, MD  Reason for Consultation: Establishing goals of care  HPI/Patient Profile: 83 y.o. male  with past medical history of stage IV lung cancer, CVA with residual left-sided deficits, diabetes type 2, DVT/PE on anticoagulation, persistent Afib, HTN, HLD, GERD admitted on 10/05/2023 with LLL pneumonia.   Clinical Assessment and Goals of Care: Consult received and chart review completed. I met today at Arthur [name]'s bedside along with wife. We were shortly joined by daughter and other family. Daughter is a Engineer, civil (consulting). Arthur [name] is very pleased with his progress and care. He speaks highly of his PCP Arthur [name] as well as care at [facility]. He is happy to be cleared for diet. Heis feeling much better.   I spoke further with daughter who reports that Arthur [name] is doing better today. She shares that they have had conversations about his wishes from time to time and he has desired ongoing treatment. However, she does report that he has shared that he is tired. They seem to understand that he may not be able to continue pushing forward much longer. Family are respectful of his wishes. Daughter shares that he was doing well in SNF rehab with hopes of return home soon. Unfortunately this is a set back but they are hopeful to rehab enough to be able to help transfer to help with care at home. Desire to continue care and follow up with Dr. Sherrod.   They share about ongoing L shoulder pain limiting movement. We discussed symptom management and they request K-pad as heat helps. They also report relief with Tylenol  and OxyIR 5 mg (2.5 mg did not provide relief). Will order as requested.   We discussed referral to my colleague at [facility] for ongoing  outpatient follow up for ongoing GOC and symptom management. They agree with referral.   All questions/concerns addressed. Emotional support provided.   Primary Decision Maker HCPOA son Vipul Coudriet     SUMMARY OF RECOMMENDATIONS   - DNR in place - Considering transition back to SNF rehab with hopes of return home soon - Follow up with Dr. Sherrod - Referral to Levon Caras) Pickenpack-Cousar, NP palliative care at [facility]  Code Status/Advance Care Planning: DNR   Symptom Management:  L shoulder pain: K-pad Tylenol  650 mg q6h PRN mild pain OxyIR 5 mg q6h PRN moderate pain Bowel Regimen: Senokot daily PRN   Prognosis:  Overall prognosis poor  Discharge Planning: Skilled Nursing Facility for rehab with Palliative care service follow-up      Primary Diagnoses: Present on Admission:  Pneumonia  Paroxysmal atrial fibrillation (HCC)   I have reviewed the medical record, interviewed the patient and family, and examined the patient. The following aspects are pertinent.  Past Medical History:  Diagnosis Date   Diabetes mellitus without complication (HCC)    High cholesterol    Hypertension  lung ca    Normal nuclear stress test 01/29/2008   stress perfusion study apparently in 2010 in [facility] which he said was negative.   Social History   Socioeconomic History   Marital status: Married    Spouse name: Not on file   Number of children: Not on file   Years of education: Not on file   Highest education level: Not on file  Occupational History   Not on file  Tobacco Use   Smoking status: Never   Smokeless tobacco: Never  Substance and Sexual Activity   Alcohol use: No   Drug use: No   Sexual activity: Not on file  Other Topics Concern   Not on file  Social History Narrative   Social History:   He works part-time as a Airline pilot. He is married with 3 children. He's never smoked cigarettes and does not drink alcohol.            Social Drivers of Manufacturing engineer Strain: Low Risk  (10/28/2022)   Overall Financial Resource Strain (CARDIA)    Difficulty of Paying Living Expenses: Not hard at all  Food Insecurity: No Food Insecurity (10/06/2023)   Hunger Vital Sign    Worried About Running Out of Food in the Last Year: Never true    Ran Out of Food in the Last Year: Never true  Transportation Needs: No Transportation Needs (10/06/2023)   PRAPARE - Administrator, Civil Service (Medical): No    Lack of Transportation (Non-Medical): No  Physical Activity: Insufficiently Active (10/28/2022)   Exercise Vital Sign    Days of Exercise per Week: 3 days    Minutes of Exercise per Session: 30 min  Stress: No Stress Concern Present (10/28/2022)   [employer] of Occupational Health - Occupational Stress Questionnaire    Feeling of Stress : Not at all  Social Connections: Moderately Integrated (10/06/2023)   Social Connection and Isolation Panel    Frequency of Communication with Friends and Family: More than three times a week    Frequency of Social Gatherings with Friends and Family: Three times a week    Attends Religious Services: 1 to 4 times per year    Active Member of Clubs or Organizations: No    Attends Banker Meetings: Never    Marital Status: Married   Family History  Problem Relation Age of Onset   Coronary artery disease Father    Coronary artery disease Mother    Scheduled Meds:  enoxaparin   60 mg Subcutaneous Q12H   hydrocortisone  sod succinate (SOLU-CORTEF ) inj  50 mg Intravenous Daily   Continuous Infusions:  azithromycin  500 mg (10/07/23 0922)   cefTRIAXone  (ROCEPHIN )  IV Stopped (10/07/23 0009)   lactated ringers  100 mL/hr at 10/07/23 0336   PRN Meds:.mouth rinse Allergies  Allergen Reactions   Iohexol  Other (See Comments)    Severe ATN after CT with contrast May 2023 - creatinine peaked near 8. Did not require HD.   Ace Inhibitors Cough   Review of Systems  Constitutional:   Positive for activity change, appetite change and fatigue.  Neurological:  Positive for weakness.    Physical Exam Vitals and nursing note reviewed.  Constitutional:      Appearance: He is ill-appearing.  Cardiovascular:     Rate and Rhythm: Normal rate.  Pulmonary:     Effort: No tachypnea, accessory muscle usage or respiratory distress.  Abdominal:     Palpations: Abdomen is  soft.  Neurological:     Mental Status: He is alert and oriented to person, place, and time.     Vital Signs: BP 128/64 (BP Location: Right Arm)   Pulse (!) 110   Temp 98.8 F (37.1 C) (Axillary)   Resp 18   Wt 62.5 kg   SpO2 95%   BMI 19.22 kg/m  Pain Scale: 0-10   Pain Score: 0-No pain   SpO2: SpO2: 95 % O2 Device:SpO2: 95 % O2 Flow Rate: .O2 Flow Rate (L/min): 2 L/min  IO: Intake/output summary:  Intake/Output Summary (Last 24 hours) at 10/07/2023 0945 Last data filed at 10/07/2023 9663 Gross per 24 hour  Intake 1442.43 ml  Output --  Net 1442.43 ml    LBM: Last BM Date : 10/05/23 Baseline Weight: Weight: 62.5 kg Most recent weight: Weight: 62.5 kg     Palliative Assessment/Data:    Time Total:  70 min  Greater than 50%  of this time was spent counseling and coordinating care related to the above assessment and plan.  Signed by: Bernarda Kitty, NP Palliative Medicine Team Pager # [phone] (M-F 8a-5p) Team Phone # [phone] (Nights/Weekends)

## 2023-10-07 NOTE — Plan of Care (Signed)

## 2023-10-07 NOTE — Assessment & Plan Note (Addendum)
 Imaging consistent with left lower lobe pneumonia, will provide CAP coverage for possible component of aspiration. - Ceftriaxone  1 g daily and azithromycin  500 mg daily x5 days (last dose 9/12) and reassess for possible extension of course.  - modified barium swallow ordered by SLP - Troponin negative, ruling out CAD - F/u blood cultures - Strep pneumo U antigen negative

## 2023-10-07 NOTE — Assessment & Plan Note (Addendum)
 Primary adenocarcinoma of lung -  chemotherapy held by recs from outpatient oncology  Anemia - hemoglobin stable. 9.3 today. HLD - holding rosuvastatin .

## 2023-10-07 NOTE — Evaluation (Signed)
 Physical Therapy Evaluation Patient Details Name: Arthur [name] MRN: [id number] DOB: April 15, 1940 Today's Date: 10/07/2023  History of Present Illness  Pt is 83 yo presenting to [facility] on 9/7 from SNF due to shortness of breath and somnolence. Imaging positive for CAP per MD note. PMHx: Stage IV lung CA, prior PE, DVT, MI, HLD, GERD, T2DM, CAD, and anxiety.  Clinical Impression  Pt familiar to this PT from last admission, where he was ambulating short distances with a RW and chair follow. Pt discharged to SNF and was readmitted; pt reports not mobilizing much at SNF and needing assist for ADL's. Pt currently requiring mod-maxA + 2 for bed mobility and stand pivot transfers to chair. Limited by generalized weakness, L shoulder pain, impaired standing balance and generalized weakness. Patient will benefit from continued inpatient follow up therapy, <3 hours/day.      If plan is discharge home, recommend the following: A lot of help with walking and/or transfers;Two people to help with walking and/or transfers;A lot of help with bathing/dressing/bathroom   Can travel by private vehicle   No    Equipment Recommendations None recommended by PT  Recommendations for Other Services       Functional Status Assessment Patient has had a recent decline in their functional status and demonstrates the ability to make significant improvements in function in a reasonable and predictable amount of time.     Precautions / Restrictions Precautions Precautions: Fall;Other (comment) Recall of Precautions/Restrictions: Intact Precaution/Restrictions Comments: from last admission 08/2023: LUE relative rest per ortho, sling for comfort Required Braces or Orthoses:  (sling for comfort- not in room) Restrictions Weight Bearing Restrictions Per Provider Order: No Other Position/Activity Restrictions: LUE relative rest; sling for comfort- no sling in room      Mobility  Bed Mobility Overal bed mobility:  Needs Assistance Bed Mobility: Supine to Sit     Supine to sit: Max assist, +2 for physical assistance     General bed mobility comments: using R UE to support, pt able to move LEs towards R side of bed but needing max assist for trunk, and scooting hips    Transfers Overall transfer level: Needs assistance Equipment used: 2 person hand held assist Transfers: Sit to/from Stand, Bed to chair/wheelchair/BSC Sit to Stand: Mod assist, +2 physical assistance   Step pivot transfers: Mod assist, +2 physical assistance       General transfer comment: B hand held support from elevated EOB with mod assist +2.  cueing for technique and stepping to recliner.    Ambulation/Gait                  Stairs            Wheelchair Mobility     Tilt Bed    Modified Rankin (Stroke Patients Only)       Balance Overall balance assessment: Needs assistance Sitting-balance support: Feet supported, No upper extremity supported Sitting balance-Leahy Scale: Fair Sitting balance - Comments: static sitting EOB   Standing balance support: During functional activity, Bilateral upper extremity supported Standing balance-Leahy Scale: Poor Standing balance comment: relies on BUE and external support                             Pertinent Vitals/Pain Pain Assessment Pain Assessment: Faces Faces Pain Scale: Hurts even more Pain Location: L shoulder Pain Descriptors / Indicators: Guarding, Discomfort Pain Intervention(s): Limited activity within patient's tolerance, Monitored during session, Other (  comment) (pt NPO so unable to be medicated)    Home Living Family/patient expects to be discharged to:: Skilled nursing facility                        Prior Function Prior Level of Function : Independent/Modified Independent             Mobility Comments: using RW for mobility d/t L foot drop prior to 08/2023 admission, but not mobilizing at SNF per pt ADLs  Comments: Reports able to toilet, bathe self - some assist for LB dressing. Family assist with IADLs. pt no longer drives prior to admission 08/2023; needing assist for bathing/dressing at SNF but able to complete grooming tasks     Extremity/Trunk Assessment   Upper Extremity Assessment Upper Extremity Assessment: Defer to OT evaluation LUE Deficits / Details: guarding L shoulder and did not attempt to move shoulder, able to flex/extend elbow with increased time but reports causing pain in shoulder.  grasp 3/5 LUE Sensation: WNL LUE Coordination: decreased fine motor;decreased gross motor    Lower Extremity Assessment Lower Extremity Assessment: Generalized weakness    Cervical / Trunk Assessment Cervical / Trunk Assessment: Normal  Communication   Communication Communication: Impaired Factors Affecting Communication: Difficulty expressing self    Cognition Arousal: Alert Behavior During Therapy: WFL for tasks assessed/performed   PT - Cognitive impairments: Awareness, Memory, Sequencing, Initiation                       PT - Cognition Comments: pt oriented, only thinks today is sunday (when it is [place]), but overall following simple commands with increased time. slow processing and decreased probelm solving Following commands: Impaired Following commands impaired: Follows one step commands with increased time     Cueing Cueing Techniques: Verbal cues, Tactile cues     General Comments      Exercises     Assessment/Plan    PT Assessment Patient needs continued PT services  PT Problem List Decreased strength;Decreased range of motion;Decreased activity tolerance;Decreased balance;Decreased mobility;Decreased coordination;Decreased knowledge of use of DME;Decreased safety awareness;Decreased knowledge of precautions;Pain       PT Treatment Interventions DME instruction;Gait training;Functional mobility training;Therapeutic activities;Therapeutic  exercise;Balance training;Neuromuscular re-education;Patient/family education;Wheelchair mobility training;Manual techniques    PT Goals (Current goals can be found in the Care Plan section)  Acute Rehab PT Goals Patient Stated Goal: did not state PT Goal Formulation: With patient Time For Goal Achievement: 10/21/23 Potential to Achieve Goals: Fair    Frequency Min 2X/week     Co-evaluation PT/OT/SLP Co-Evaluation/Treatment: Yes Reason for Co-Treatment: For patient/therapist safety;To address functional/ADL transfers PT goals addressed during session: Mobility/safety with mobility OT goals addressed during session: ADL's and self-care       AM-PAC PT 6 Clicks Mobility  Outcome Measure Help needed turning from your back to your side while in a flat bed without using bedrails?: A Little Help needed moving from lying on your back to sitting on the side of a flat bed without using bedrails?: A Lot Help needed moving to and from a bed to a chair (including a wheelchair)?: A Lot Help needed standing up from a chair using your arms (e.g., wheelchair or bedside chair)?: A Lot Help needed to walk in hospital room?: Total Help needed climbing 3-5 steps with a railing? : Total 6 Click Score: 11    End of Session Equipment Utilized During Treatment: Gait belt Activity Tolerance: Patient tolerated treatment well  Patient left: in chair;with call bell/phone within reach;with chair alarm set Nurse Communication: Mobility status PT Visit Diagnosis: Unsteadiness on feet (R26.81);Other abnormalities of gait and mobility (R26.89);Pain;Muscle weakness (generalized) (M62.81);Difficulty in walking, not elsewhere classified (R26.2) Pain - Right/Left: Left Pain - part of body: Shoulder    Time: 8986-8965 PT Time Calculation (min) (ACUTE ONLY): 21 min   Charges:   PT Evaluation $PT Eval Low Complexity: 1 Low   PT General Charges $$ ACUTE PT VISIT: 1 Visit         Aleck Daring, PT,  DPT Acute Rehabilitation Services Office [phone]   Aleck ONEIDA Daring 10/07/2023, 12:21 PM

## 2023-10-07 NOTE — Progress Notes (Signed)
 Daily Progress Note Intern Pager: [phone]  Patient name: Arthur [name] Medical record number: [id number] Date of birth: [birth year] Age: 83 y.o. Gender: male  Primary Care Provider: Stoney Blizzard, DO Consultants: SLP, Palliative Code Status: DNR/DNI  Pt Overview and Major Events to Date:  9/8: EEG shows moderate diffuse encephalopathy  Medical Decision Making:  Arthur [name] is a 83 y.o. male with a PMH of A. Fib, CVA with residual L sided deficits, and primary adenocarcinoma of the lung presenting with shortness of breath. Assessment & Plan Pneumonia Imaging consistent with left lower lobe pneumonia, will provide CAP coverage for possible component of aspiration. - Ceftriaxone  1 g daily and azithromycin  500 mg daily x5 days (last dose 9/12) and reassess for possible extension of course.  - modified barium swallow ordered by SLP - Troponin negative, ruling out CAD - F/u blood cultures - Strep pneumo U antigen negative Somnolence Appears to be more of an acute onset in the ED and is improving.  MRI brain negative for new vascular or structural abnormalities. Held centrally acting meds. - Neurochecks every 4 hours - N.p.o. pending improvement in mentation - MIVF at 100 mL/hr Vomiting No abdominal pain upon exam and no further episodes of vomiting since admission.  CTAP showed possible sequelae of chronic pancreatitis unclear if contributing, but lipase is 12.  Possible component of gastroenteritis given acute onset.   - Will monitor closely for recurrence of symptoms. Paroxysmal atrial fibrillation (HCC) Pt found to be tachycardic overnight 9/8 and EKG showed A fib.  - monitor closely and will add metoprolol if HR sustained over 110 bpm Chronic health problem Primary adenocarcinoma of lung -  chemotherapy held by recs from outpatient oncology  Anemia - hemoglobin stable. 9.3 today. HLD - holding rosuvastatin .   FEN/GI: NPO 2/2 AMS PPx: lovenox  Dispo:Home  pending clinical improvement .   Subjective:  Pt is present and attentive during the interview this morning. Overnight pt was in A fib. Pt denies any chest discomfort or palpitations at this time. Denies abdominal pain, nausea, or vomiting. Denies dyspnea. Pt wondering when his family will come to visit. He states that he is pleased that he is at [facility] because he is taken well care of here.   Objective: Temp:  [98 F (36.7 C)-98.7 F (37.1 C)] 98.5 F (36.9 C) (09/09 0021) Pulse Rate:  [87-119] 99 (09/09 0021) Resp:  [15-20] 19 (09/09 0021) BP: (110-134)/(57-66) 111/60 (09/09 0021) SpO2:  [87 %-100 %] 97 % (09/09 0021) Physical Exam: General: NAD. Alert and oriented x3  Cardiovascular: regular rate and rhythm Respiratory: diminished breath sounds on LLL.  Abdomen: Soft, nontender, and nondistended in all quadrants Extremities: no edema. DP pulses 1+  Laboratory: Most recent CBC Lab Results  Component Value Date   WBC 10.8 (H) 10/07/2023   HGB 9.3 (L) 10/07/2023   HCT 26.8 (L) 10/07/2023   MCV 84.5 10/07/2023   PLT 426 (H) 10/07/2023   Most recent BMP    Latest Ref Rng & Units 10/07/2023    4:41 AM  BMP  Glucose 70 - 99 mg/dL 893   BUN 8 - 23 mg/dL 13   Creatinine 9.38 - 1.24 mg/dL 9.02   Sodium 864 - 854 mmol/L 138   Potassium 3.5 - 5.1 mmol/L 4.1   Chloride 98 - 111 mmol/L 105   CO2 22 - 32 mmol/L 22   Calcium  8.9 - 10.3 mg/dL 7.9     Strep Pneumo Urinary Antigen:  Negative  Imaging/Diagnostic Tests: Radiologist Impression:  MRI Brain wo contrast: IMPRESSION: 1. No evidence of acute intracranial abnormality. 2. Remote left larger than right occipital infarcts. 3. Remote lacunar infarcts in the cerebellum.   Arthur [name], Medical Student 10/07/2023, 7:21 AM  MS4 AI, [facility] FPTS Intern pager: [phone], text pages welcome Secure chat group [facility] Teaching Service   I agree with the assessment and plan as  documented above.  Stuart Redo, MD PGY-3, [facility]

## 2023-10-07 NOTE — Plan of Care (Signed)

## 2023-10-07 NOTE — Assessment & Plan Note (Addendum)
 Appears to be more of an acute onset in the ED and is improving.  MRI brain negative for new vascular or structural abnormalities. Held centrally acting meds. - Neurochecks every 4 hours - N.p.o. pending improvement in mentation - MIVF at 100 mL/hr

## 2023-10-07 NOTE — Assessment & Plan Note (Addendum)
 No abdominal pain upon exam and no further episodes of vomiting since admission.  CTAP showed possible sequelae of chronic pancreatitis unclear if contributing, but lipase is 12.  Possible component of gastroenteritis given acute onset.   - Will monitor closely for recurrence of symptoms.

## 2023-10-07 NOTE — TOC CM/SW Note (Signed)
 Transition of Care [facility]) - Inpatient Brief Assessment   Patient Details  Name: Arthur [name] MRN: [id number] Date of Birth: 11-Jan-1941  Transition of Care [facility] CM/SW Contact:    [name], LCSW Phone Number: 10/07/2023, 10:09 AM   Clinical Narrative:   Patient admitted from [facility] for rehab, has been there since 8/26. Prior to that, was living at home with spouse. Patient now NPO, awaiting therapy evals, and palliative consult pending for goals of care. CSW to continue to follow to assist with disposition when appropriate.    Transition of Care Asessment: Insurance and Status: Insurance coverage has been reviewed Patient has primary care physician: Yes Home environment has been reviewed: been at [facility] for rehab, previously at home with spouse Prior level of function:: Requires assist Prior/Current Home Services: Current home services Social Drivers of Health Review: SDOH reviewed no interventions necessary Readmission risk has been reviewed: Yes Transition of care needs: transition of care needs identified, TOC will continue to follow

## 2023-10-07 NOTE — Evaluation (Signed)
 Occupational Therapy Evaluation Patient Details Name: Arthur [name] MRN: [id number] DOB: [birth year] Today's Date: 10/07/2023   History of Present Illness   Pt is 83 yo presenting to [facility] on 9/7 from SNF due to shortness of breath and somnolence. Imaging positive for CAP per MD note. PMHx: Stage IV lung CA, prior PE, DVT, MI, HLD, GERD, T2DM, CAD, and anxiety.     Clinical Impressions Patient admitted from SNF for above and presents with problem list below.  Patient reports at SNF not mobilizing much at and needing assist for ADLs recently but able to manage grooming with setup assist.  Prior to last admission he was RW and needing a little assist for LB ADLs and IADLs.  Currently, requires max assist +2 for bed mobility and mod assist +2 for transfers to recliner, min to total assist +2 for ADLs. Limited by L shoulder pain, impaired balance, decreased activity tolerance and generalized weakness. Cognitively, needing increased time to process and follow simple commands, decreased awareness and problem solving.  Oriented, but reports it is Sunday (re-oriented to Tuesday).  Based on performance today, believe patient will best benefit from continued OT services acutely and after dc at inpatient setting with <3hrs/day to optimize independence, safety with ADLs and mobility.     If plan is discharge home, recommend the following:   Direct supervision/assist for medications management;Direct supervision/assist for financial management;Assist for transportation;Help with stairs or ramp for entrance;Two people to help with walking and/or transfers;Two people to help with bathing/dressing/bathroom;Assistance with cooking/housework     Functional Status Assessment   Patient has had a recent decline in their functional status and demonstrates the ability to make significant improvements in function in a reasonable and predictable amount of time.     Equipment Recommendations   Other (comment)  (defer)     Recommendations for Other Services         Precautions/Restrictions   Precautions Precautions: Fall;Other (comment) Recall of Precautions/Restrictions: Intact Precaution/Restrictions Comments: from last admission 08/2023: LUE relative rest per ortho, sling for comfort Required Braces or Orthoses:  (sling for comfort- not in room) Restrictions Weight Bearing Restrictions Per Provider Order: No Other Position/Activity Restrictions: LUE relative rest; sling for comfort- no sling in room     Mobility Bed Mobility Overal bed mobility: Needs Assistance Bed Mobility: Supine to Sit     Supine to sit: Max assist, +2 for physical assistance     General bed mobility comments: using R UE to support, pt able to move LEs towards R side of bed but needing max assist for trunk, and scooting hips    Transfers Overall transfer level: Needs assistance Equipment used: 2 person hand held assist Transfers: Sit to/from Stand, Bed to chair/wheelchair/BSC Sit to Stand: Mod assist, +2 physical assistance     Step pivot transfers: Mod assist, +2 physical assistance     General transfer comment: B hand held support from elevated EOB with mod assist +2.  cueing for technique and stepping to recliner.      Balance Overall balance assessment: Needs assistance Sitting-balance support: Feet supported, No upper extremity supported Sitting balance-Leahy Scale: Fair Sitting balance - Comments: static sitting EOB   Standing balance support: During functional activity, Bilateral upper extremity supported Standing balance-Leahy Scale: Poor Standing balance comment: relies on BUE and external support                           ADL either performed or assessed  with clinical judgement   ADL Overall ADL's : Needs assistance/impaired     Grooming: Minimal assistance;Sitting           Upper Body Dressing : Moderate assistance;Sitting   Lower Body Dressing: Total  assistance;+2 for physical assistance;Sit to/from stand   Toilet Transfer: Moderate assistance;+2 for safety/equipment;+2 for physical assistance Toilet Transfer Details (indicate cue type and reason): bil hand held support, stand step to recliner         Functional mobility during ADLs: Moderate assistance;Maximal assistance;+2 for physical assistance       Vision   Vision Assessment?: No apparent visual deficits     Perception         Praxis         Pertinent Vitals/Pain Pain Assessment Pain Assessment: Faces Faces Pain Scale: Hurts even more Pain Location: L shoulder Pain Descriptors / Indicators: Guarding, Discomfort Pain Intervention(s): Limited activity within patient's tolerance, Monitored during session, Repositioned     Extremity/Trunk Assessment Upper Extremity Assessment Upper Extremity Assessment: Generalized weakness;LUE deficits/detail LUE Deficits / Details: guarding L shoulder and did not attempt to move shoulder, able to flex/extend elbow with increased time but reports causing pain in shoulder.  grasp 3/5 LUE Sensation: WNL LUE Coordination: decreased fine motor;decreased gross motor   Lower Extremity Assessment Lower Extremity Assessment: Defer to PT evaluation   Cervical / Trunk Assessment Cervical / Trunk Assessment: Normal   Communication Communication Communication: Impaired Factors Affecting Communication: Difficulty expressing self   Cognition Arousal: Alert Behavior During Therapy: WFL for tasks assessed/performed Cognition: Cognition impaired   Orientation impairments: Time Awareness: Intellectual awareness intact, Online awareness impaired Memory impairment (select all impairments): Working memory Attention impairment (select first level of impairment): Sustained attention Executive functioning impairment (select all impairments): Initiation, Sequencing, Problem solving OT - Cognition Comments: pt oriented, only thinks today is  sunday (when it is [place]), but overall following simple commands with increased time. slow processing and decreased probelm solving                 Following commands: Impaired Following commands impaired: Follows one step commands with increased time     Cueing  General Comments   Cueing Techniques: Verbal cues;Tactile cues      Exercises     Shoulder Instructions      Home Living Family/patient expects to be discharged to:: Skilled nursing facility                                        Prior Functioning/Environment Prior Level of Function : Independent/Modified Independent             Mobility Comments: using RW for mobility d/t L foot drop prior to 08/2023 admission, but not mobilizing at SNF per pt ADLs Comments: Reports able to toilet, bathe self - some assist for LB dressing. Family assist with IADLs. pt no longer drives prior to admission 08/2023; needing assist for bathing/dressing at SNF but able to complete grooming tasks    OT Problem List: Decreased strength;Decreased range of motion;Decreased activity tolerance;Impaired balance (sitting and/or standing);Decreased cognition;Decreased safety awareness;Impaired UE functional use;Decreased knowledge of precautions;Decreased knowledge of use of DME or AE;Decreased coordination;Pain   OT Treatment/Interventions: Self-care/ADL training;Therapeutic exercise;Energy conservation;DME and/or AE instruction;Therapeutic activities;Patient/family education;Balance training;Cognitive remediation/compensation      OT Goals(Current goals can be found in the care plan section)   Acute Rehab OT Goals Patient Stated  Goal: get back to bed soon OT Goal Formulation: With patient Time For Goal Achievement: 10/21/23 Potential to Achieve Goals: Good   OT Frequency:  Min 2X/week    Co-evaluation PT/OT/SLP Co-Evaluation/Treatment: Yes Reason for Co-Treatment: For patient/therapist safety;To address  functional/ADL transfers PT goals addressed during session: Mobility/safety with mobility OT goals addressed during session: ADL's and self-care      AM-PAC OT 6 Clicks Daily Activity     Outcome Measure Help from another person eating meals?: Total Help from another person taking care of personal grooming?: A Little Help from another person toileting, which includes using toliet, bedpan, or urinal?: Total Help from another person bathing (including washing, rinsing, drying)?: A Lot Help from another person to put on and taking off regular upper body clothing?: A Lot Help from another person to put on and taking off regular lower body clothing?: Total 6 Click Score: 10   End of Session Equipment Utilized During Treatment: Gait belt;Oxygen Nurse Communication: Mobility status;Precautions  Activity Tolerance: Patient tolerated treatment well Patient left: in chair;with call bell/phone within reach;with chair alarm set  OT Visit Diagnosis: Unsteadiness on feet (R26.81);Other abnormalities of gait and mobility (R26.89);Muscle weakness (generalized) (M62.81);Other symptoms and signs involving cognitive function                Time: 8986-8965 OT Time Calculation (min): 21 min Charges:  OT General Charges $OT Visit: 1 Visit OT Evaluation $OT Eval Moderate Complexity: 1 Mod  Etta NOVAK, OT Acute Rehabilitation Services Office [phone] Secure Chat Preferred    Etta GORMAN Hope 10/07/2023, 11:06 AM

## 2023-10-08 ENCOUNTER — Encounter: Payer: Self-pay | Admitting: Internal Medicine

## 2023-10-08 ENCOUNTER — Other Ambulatory Visit (HOSPITAL_COMMUNITY): Payer: Self-pay

## 2023-10-08 DIAGNOSIS — J189 Pneumonia, unspecified organism: Secondary | ICD-10-CM | POA: Diagnosis not present

## 2023-10-08 LAB — LEGIONELLA PNEUMOPHILA SEROGP 1 UR AG: L. pneumophila Serogp 1 Ur Ag: NEGATIVE

## 2023-10-08 LAB — CBC
HCT: 25.6 % — ABNORMAL LOW (ref 39.0–52.0)
Hemoglobin: 8.4 g/dL — ABNORMAL LOW (ref 13.0–17.0)
MCH: 28.2 pg (ref 26.0–34.0)
MCHC: 32.8 g/dL (ref 30.0–36.0)
MCV: 85.9 fL (ref 80.0–100.0)
Platelets: 376 K/uL (ref 150–400)
RBC: 2.98 MIL/uL — ABNORMAL LOW (ref 4.22–5.81)
RDW: 21 % — ABNORMAL HIGH (ref 11.5–15.5)
WBC: 9.1 K/uL (ref 4.0–10.5)
nRBC: 0 % (ref 0.0–0.2)

## 2023-10-08 LAB — MAGNESIUM: Magnesium: 1.8 mg/dL (ref 1.7–2.4)

## 2023-10-08 MED ORDER — MAGNESIUM SULFATE 2 GM/50ML IV SOLN
2.0000 g | Freq: Once | INTRAVENOUS | Status: AC
  Administered 2023-10-08: 2 g via INTRAVENOUS
  Filled 2023-10-08: qty 50

## 2023-10-08 MED ORDER — AMOXICILLIN-POT CLAVULANATE 875-125 MG PO TABS
1.0000 | ORAL_TABLET | Freq: Two times a day (BID) | ORAL | 0 refills | Status: DC
  Filled 2023-10-08: qty 2, 1d supply, fill #0

## 2023-10-08 MED ORDER — MUSCLE RUB 10-15 % EX CREA: 1.0000 | TOPICAL_CREAM | CUTANEOUS | 0 refills | Status: DC | PRN

## 2023-10-08 NOTE — [place] FL2 (Signed)
 [place]  MEDICAID FL2 LEVEL OF CARE FORM     IDENTIFICATION  Patient Name: Arthur [name] Birthdate: 10/10/1940 Sex: male Admission Date (Current Location): 10/05/2023  [facility] and [place] Number:  Producer, television/film/video and Address:  [facility], [address] [place], [place] [address]      Provider Number: [id number]  Attending Physician Name and Address:  Donah Laymon PARAS, MD  Relative Name and Phone Number:       Current Level of Care: Hospital Recommended Level of Care: Skilled Nursing Facility Prior Approval Number:    Date Approved/Denied:   PASRR Number: [id number]  Discharge Plan: SNF    Current Diagnoses: Patient Active Problem List   Diagnosis Date Noted   Pneumonia 10/06/2023   Encephalopathy 10/06/2023   Malnutrition of moderate degree 09/20/2023   Nontraumatic tear of left rotator cuff 09/19/2023   Decreased oral intake 09/17/2023   Chronic health problem 09/15/2023   Nontraumatic complete tear of left rotator cuff 09/15/2023   Left shoulder pain 09/14/2023   Duodenal ulcer 10/20/2022   Chronic anticoagulation 10/19/2022   Paroxysmal atrial fibrillation (HCC) 10/18/2022   Renal insufficiency 07/04/2021   Foot drop, left foot 07/04/2021   Non-small cell carcinoma of lung, stage 4 (HCC) 06/15/2021   Encounter for antineoplastic chemotherapy 06/13/2021   Deep venous embolism and thrombosis (HCC) 05/16/2021   Inguinal hernia 05/16/2021   Primary adenocarcinoma of lung (HCC) 04/09/2021   Cerebral embolism with cerebral infarction 03/18/2021   Status post coronary artery stent placement    Coronary artery disease due to lipid rich plaque    Pulmonary embolus (HCC) 02/03/2021   Weight loss 03/08/2020   Anemia 09/06/2015   Hematoma 09/09/2013   GERD (gastroesophageal reflux disease) 07/05/2013   Weakness 01/20/2012   Arthritis 02/26/2010   Gout 11/06/2009   Diabetes mellitus, type II (HCC) 09/22/2009   Hyperlipidemia  09/22/2009   Essential hypertension, benign 09/22/2009    Orientation RESPIRATION BLADDER Height & Weight     Self, Situation, Time, Place  Normal Incontinent Weight: 137 lb 12.6 oz (62.5 kg) Height:     BEHAVIORAL SYMPTOMS/MOOD NEUROLOGICAL BOWEL NUTRITION STATUS      Continent Diet (see DC summary)  AMBULATORY STATUS COMMUNICATION OF NEEDS Skin   Extensive Assist Verbally Normal                       Personal Care Assistance Level of Assistance  Bathing, Feeding, Dressing Bathing Assistance: Maximum assistance Feeding assistance: Maximum assistance Dressing Assistance: Maximum assistance     Functional Limitations Info  Sight Sight Info: Impaired        SPECIAL CARE FACTORS FREQUENCY  PT (By licensed PT), OT (By licensed OT), Speech therapy     PT Frequency: 5x/wk OT Frequency: 5x/wk     Speech Therapy Frequency: 5x/wk      Contractures Contractures Info: Not present    Additional Factors Info  Code Status, Allergies, Psychotropic Code Status Info: DNR Allergies Info: Iohexol , Ace Inhibitors Psychotropic Info: Buspar  5mg  2x/day         Current Medications (10/08/2023):  This is the current hospital active medication list Current Facility-Administered Medications  Medication Dose Route Frequency Provider Last Rate Last Admin   acetaminophen  (TYLENOL ) tablet 650 mg  650 mg Oral Q6H PRN Parker, Alicia C, NP   650 mg at 10/08/23 0941   busPIRone  (BUSPAR ) tablet 5 mg  5 mg Oral BID Nygaard, Joseph, MD  5 mg at 10/08/23 0947   cefTRIAXone  (ROCEPHIN ) 1 g in sodium chloride  0.9 % 100 mL IVPB  1 g Intravenous Q24H Janna, Adriana, DO 200 mL/hr at 10/08/23 0035 1 g at 10/08/23 0035   clopidogrel  (PLAVIX ) tablet 75 mg  75 mg Oral Q breakfast Lorrane Pac, MD   75 mg at 10/08/23 0941   diclofenac  Sodium (VOLTAREN ) 1 % topical gel 2 g  2 g Topical PRN Nygaard, Joseph, MD       enoxaparin  (LOVENOX ) injection 60 mg  60 mg Subcutaneous Q12H Theophilus Pagan, MD   60  mg at 10/08/23 9057   hydrocortisone  sodium succinate  (SOLU-CORTEF ) 100 MG injection 50 mg  50 mg Intravenous Daily Tharon Lung, MD   50 mg at 10/08/23 0942   lidocaine  (LIDODERM ) 5 % 1 patch  1 patch Transdermal Daily PRN Gretel Prentice BIRCH, RPH   1 patch at 10/08/23 9056   magnesium  sulfate IVPB 2 g 50 mL  2 g Intravenous Once Mabe, Gerald, MD 50 mL/hr at 10/08/23 1210 2 g at 10/08/23 1210   Muscle Rub CREA   Topical PRN Donah Laymon PARAS, MD   Given at 10/08/23 [phone]   Oral care mouth rinse  15 mL Mouth Rinse PRN Donah Laymon PARAS, MD       oxyCODONE  (Oxy IR/ROXICODONE ) immediate release tablet 5 mg  5 mg Oral Q6H PRN Parker, Alicia C, NP       pantoprazole  (PROTONIX ) EC tablet 40 mg  40 mg Oral BID Nygaard, Joseph, MD   40 mg at 10/08/23 9057   rosuvastatin  (CRESTOR ) tablet 5 mg  5 mg Oral QHS Nygaard, Joseph, MD   5 mg at 10/07/23 2231   senna (SENOKOT) tablet 8.6 mg  1 tablet Oral Daily PRN Parker, Alicia C, NP       white petrolatum  (VASELINE) gel   Topical PRN McIntyre, Brittany J, MD         Discharge Medications: Please see discharge summary for a list of discharge medications.  Relevant Imaging Results:  Relevant Lab Results:   Additional Information SS#: [id number]  [name], LCSW

## 2023-10-08 NOTE — Assessment & Plan Note (Addendum)
 Primary adenocarcinoma of lung -  chemotherapy held by recs from outpatient oncology  Anemia - hemoglobin stable. 9.3 today. HLD - holding rosuvastatin .

## 2023-10-08 NOTE — Progress Notes (Signed)
 Physical Therapy Treatment Patient Details Name: Arthur [name] MRN: [id number] DOB: [birth year] Today's Date: 10/08/2023   History of Present Illness Pt is 83 yo presenting to [facility] on 9/7 from SNF due to shortness of breath and somnolence. Imaging positive for CAP per MD note. PMHx: Stage IV lung CA, prior PE, DVT, MI, HLD, GERD, T2DM, CAD, and anxiety.   PT Comments  Pt in recliner upon arrival and requesting to move back to the bed. Pt was able to stand with MaxA via anterior approach with +2 to assist with pericare. Pt then performed stand-pivot back to bed with MaxA. Pt declined further exercises due to high levels of pain in L shoulder. Pt was agreeable to perform a few LE exercises after lunch with pt able to demonstrate proper technique for exercises. Continue to recommend <3hrs post acute rehab with acute PT to follow.     If plan is discharge home, recommend the following: A lot of help with walking and/or transfers;Two people to help with walking and/or transfers;A lot of help with bathing/dressing/bathroom   Can travel by private vehicle     No  Equipment Recommendations  None recommended by PT       Precautions / Restrictions Precautions Precautions: Fall;Other (comment) Recall of Precautions/Restrictions: Intact Precaution/Restrictions Comments: from last admission 08/2023: LUE relative rest per ortho, sling for comfort Required Braces or Orthoses:  (sling for comfort- not in room) Restrictions Weight Bearing Restrictions Per Provider Order: No Other Position/Activity Restrictions: LUE relative rest; sling for comfort- no sling in room     Mobility  Bed Mobility Overal bed mobility: Needs Assistance Bed Mobility: Sit to Sidelying, Rolling Rolling: Min assist    Sit to sidelying: Mod assist General bed mobility comments: ModA to bring LE's into sidelying, MinA to assist with roling onto back.    Transfers Overall transfer level: Needs assistance Equipment used:  2 person hand held assist Transfers: Sit to/from Stand, Bed to chair/wheelchair/BSC Sit to Stand: Max assist, +2 safety/equipment Stand pivot transfers: Max assist, +2 safety/equipment    General transfer comment: MaxA via anterior approach with +2 assisting with pericare, performed stand-pivot with MaxA    Ambulation/Gait  General Gait Details: deferred 2/2 fatigue and pain     Balance Overall balance assessment: Needs assistance Sitting-balance support: Feet supported, No upper extremity supported Sitting balance-Leahy Scale: Fair     Standing balance support: During functional activity, Bilateral upper extremity supported Standing balance-Leahy Scale: Poor Standing balance comment: relies on BUE and external support     Communication Communication Communication: Impaired Factors Affecting Communication: Difficulty expressing self  Cognition Arousal: Alert Behavior During Therapy: WFL for tasks assessed/performed   PT - Cognitive impairments: Sequencing, Memory    PT - Cognition Comments: Slow processing with step-by step cueing. Does well when daughter repeats instructions Following commands: Impaired Following commands impaired: Follows one step commands with increased time    Cueing Cueing Techniques: Verbal cues, Tactile cues     General Comments General comments (skin integrity, edema, etc.): Discussed performing ankle pumps, quad sets, heel slides, and SLR after lunch with pt and daughter.      Pertinent Vitals/Pain Pain Assessment Pain Assessment: Faces Faces Pain Scale: Hurts whole lot Pain Location: L shoulder Pain Descriptors / Indicators: Guarding, Discomfort Pain Intervention(s): Limited activity within patient's tolerance, Monitored during session, Repositioned, Premedicated before session     PT Goals (current goals can now be found in the care plan section) Acute Rehab PT Goals Patient Stated Goal:  did not state PT Goal Formulation: With  patient Time For Goal Achievement: 10/21/23 Potential to Achieve Goals: Fair Progress towards PT goals: Progressing toward goals    Frequency    Min 2X/week       AM-PAC PT 6 Clicks Mobility   Outcome Measure  Help needed turning from your back to your side while in a flat bed without using bedrails?: A Little Help needed moving from lying on your back to sitting on the side of a flat bed without using bedrails?: A Lot Help needed moving to and from a bed to a chair (including a wheelchair)?: A Lot Help needed standing up from a chair using your arms (e.g., wheelchair or bedside chair)?: A Lot Help needed to walk in hospital room?: Total Help needed climbing 3-5 steps with a railing? : Total 6 Click Score: 11    End of Session Equipment Utilized During Treatment: Gait belt Activity Tolerance: Patient limited by fatigue;Patient limited by pain Patient left: with call bell/phone within reach;in bed;with bed alarm set;with family/visitor present Nurse Communication: Mobility status PT Visit Diagnosis: Unsteadiness on feet (R26.81);Other abnormalities of gait and mobility (R26.89);Pain;Muscle weakness (generalized) (M62.81);Difficulty in walking, not elsewhere classified (R26.2) Hemiplegia - Right/Left: Left Hemiplegia - dominant/non-dominant: Non-dominant Hemiplegia - caused by: Cerebral infarction Pain - Right/Left: Left Pain - part of body: Shoulder     Time: 8954-8941 PT Time Calculation (min) (ACUTE ONLY): 13 min  Charges:    $Therapeutic Activity: 8-22 mins PT General Charges $$ ACUTE PT VISIT: 1 Visit                    Kate ORN, PT, DPT Secure Chat Preferred  Rehab Office [phone]   Kate BRAVO Wendolyn 10/08/2023, 12:07 PM

## 2023-10-08 NOTE — Discharge Summary (Shared)
 Family Medicine Teaching [facility] Discharge Summary  Patient name: Arthur [name] Medical record number: [id number] Date of birth: [birth year] Age: 83 y.o. Gender: male Date of Admission: 10/05/2023  Date of Discharge: 10/09/2023 Admitting Physician: Laymon JINNY Legions, MD  Primary Care Provider: Stoney Blizzard, DO Consultants: SLP  Indication for Hospitalization: Shortness of breath and emesis  Discharge Diagnoses/Problem List:  Principal Problem for Admission: Pneumonia and encephalopathy Other Problems addressed during stay:  Principal Problem:   Pneumonia Active Problems:   Constipation   Paroxysmal atrial fibrillation [facility])   Chronic health problem   Encephalopathy    Brief Hospital Course:  Arthur [name] is a 83 y.o. male with a PMH of A. Fib, CVA with residual L sided deficits, and primary adenocarcinoma of the lung who was admitted to the [facility] Medicine Teaching Service at [facility] presenting with shortness of breath and emesis. His hospital course is detailed below:  Pneumonia Pt presented with dyspnea and was found on CXR and CT abdomen pelvis to have findings consistent with pneumonia in the LLL. ED started azithromycin  and ceftriaxone  and gave a DuoNeb treatment. We continued CAP coverage for a 5 day course on 9/12.   Somnolence Presented with altered mentation.  In the ED, he was somnolent and minimally responsive, only rousing to painful stimuli and not answering most questions. EEG noted moderate diffuse encephalopathy. Course improved throughout admission, with waxing and waning episodes. At the time of discharge mentation and somnolence had significantly improved, closer to baseline.  Paroxysmal A-fib Pt developed tachycardia and was found to be in paroxsymal A-fib. No sustained HR above 110. Deferred metoprolol treatment.   Results/Tests Pending at Time of Discharge:  Unresulted Labs (From admission, onward)    None      Disposition:  Home  Discharge Condition:  Fair  Discharge Exam:  Vitals:   10/09/23 0412 10/09/23 0757  BP: (!) 105/58 118/65  Pulse: 94 93  Resp: 18 18  Temp: (!) 97.5 F (36.4 C) 98 F (36.7 C)  SpO2: 97% 96%   General: NAD. Nontoxic appearing, AOx3      Cardiovascular: regular rate and rhythm Respiratory: No wheezes present. LLL with crackles. Remainder of exam unremarkable. Abdomen: soft. Tender to deep palpation to suprapubic area and LLQ. No other tenderness noted Extremities: moving limbs normally according to baseline. No edema noted.  Significant Procedures: None  Significant Labs and Imaging:  Recent Labs  Lab 10/08/23 0443  WBC 9.1  HGB 8.4*  HCT 25.6*  PLT 376   Recent Labs  Lab 10/08/23 0443  MG 1.8   Imaging: 1 VIEW XRAY OF THE CHEST 10/05/2023 11:24:00 PM IMPRESSION: 1. No acute findings.  MRI HEAD WITHOUT CONTRAST  IMPRESSION: 1. No evidence of acute intracranial abnormality. 2. Remote left larger than right occipital infarcts. 3. Remote lacunar infarcts in the cerebellum.  Modified barium swallow study Clinical Impression: Pt demonstrates a severe oropharyngeal dysphagia with silent aspiration of liquids and solid particulate matter. Pt has decreased hyoid excursion and epiglottic deflection with severe pharyngeal residue in the valleculae and pyriform sinuses. There are instances of silent aspiration of residue, particularly with nectar thick liquids. Presence of suspected ostephyte at C6/7 also impacted clearance of bolus through PES with mild backflow. Pt had increased epiglottic deflection in a chin tucked position and could clear otherwise persistent vallecular solid residue with a liquid wash in a chin tucked position. Pt needed lots of instruction to follow this, but with practice and reinforcement from  family a chin tuck could be a beneficial adaptive strategy. Thickened liquids are not effective. Pt would benefit from softer, moister solids. Recommend a  minced and moist diet (IDDSI 5/Dys 2) with thin liquids with a chin tuck and following solids with liquids. Pt would benefit from f/u with SLP in next setting. to learn strategy and start RMT and pharyngeal strengthening. Already provided some education to family including daughter in law.   DIGEST Swallow Severity Rating*             Safety: 2             Efficiency:3             Overall Pharyngeal Swallow Severity: 3 1: mild; 2: moderate; 3: severe; 4: profound  *The Dynamic Imaging Grade of Swallowing Toxicity is standardized for the head and neck cancer population, however, demonstrates promising clinical applications across populations to standardize the clinical rating of pharyngeal swallow safety and severity.  Discharge Medications:  Allergies as of 10/09/2023       Reactions   Iohexol  Other (See Comments)   Severe ATN after CT with contrast May 2023 - creatinine peaked near 8. Did not require HD.   Ace Inhibitors Cough        Medication List     TAKE these medications    acetaminophen  500 MG tablet Commonly known as: TYLENOL  Take 1,000 mg by mouth every 8 (eight) hours as needed for mild pain (pain score 1-3).   Alecensa  150 MG capsule Generic drug: alectinib Take 4 capsules (600 mg total) by mouth 2 (two) times daily with a meal.   baclofen  10 MG tablet Commonly known as: LIORESAL  Take 1 tablet (10 mg total) by mouth 2 (two) times daily as needed for muscle spasms.   busPIRone  5 MG tablet Commonly known as: BUSPAR  Take 1 tablet (5 mg total) by mouth 2 (two) times daily.   clopidogrel  75 MG tablet Commonly known as: PLAVIX  Take 1 tablet (75 mg total) by mouth daily with breakfast.   diclofenac  Sodium 1 % Gel Commonly known as: VOLTAREN  Apply 2 g topically as needed (Left shoulder pan).   enoxaparin  60 MG/0.6ML injection Commonly known as: LOVENOX  Inject 0.7 mLs into the skin every 12 (twelve) hours.   lidocaine  4 % Place 1 patch onto the skin daily as  needed (Left arm pain).   Muscle Rub 10-15 % Crea Apply 1 Application topically as needed for muscle pain.   oxyCODONE  5 MG immediate release tablet Commonly known as: Oxy IR/ROXICODONE  Take 0.5-1 tablets (2.5-5 mg total) by mouth every 6 (six) hours as needed for severe pain (pain score 7-10), moderate pain (pain score 4-6) or breakthrough pain (2.5mg  for moderate to severe pain, 5mg  for breakthrough pain). What changed:  how much to take when to take this reasons to take this   pantoprazole  40 MG tablet Commonly known as: PROTONIX  Take 1 tablet (40 mg total) by mouth 2 (two) times daily.   polyethylene glycol 17 g packet Commonly known as: MiraLax  Take 17 g by mouth daily.   predniSONE  10 MG tablet Commonly known as: DELTASONE  Take 1 tablet (10 mg total) by mouth daily with breakfast.   rosuvastatin  5 MG tablet Commonly known as: CRESTOR  Take 1 tablet by mouth once daily What changed: when to take this   senna 8.6 MG Tabs tablet Commonly known as: SENOKOT Take 1 tablet (8.6 mg total) by mouth daily.   sodium chloride  0.65 % Soln nasal  spray Commonly known as: OCEAN Place 1 spray into both nostrils as needed for congestion.   Vitamin B Complex Tabs Take 1 tablet by mouth every morning.        Discharge Instructions: Please refer to Patient Instructions section of EMR for full details.  Patient was counseled important signs and symptoms that should prompt return to medical care, changes in medications, dietary instructions, activity restrictions, and follow up appointments.   Follow-Up Appointments:  Follow-up Information     Rosendo Rush, MD. Go on 10/10/2023.   Specialty: Family Medicine Why: at 11:30 AM for hospital follow up Contact information: [address] [place] [address] [phone]                 [name], Medical Student 10/09/2023, 11:33 AM MS4 IV, [facility]   I have discussed the above with  Student Doctor Latrisha Coiro and agree with the documented plan. My edits for correction/addition/clarification are included above. Please see any attending notes.   Kathrine Melena, DO PGY-2, [facility] 10/09/2023 2:01 PM

## 2023-10-08 NOTE — Plan of Care (Signed)

## 2023-10-08 NOTE — Assessment & Plan Note (Addendum)
 Pt more attentive today, significant improvement from first hospitalization day. Occasionally gets confused by questions but AAOx2. Daughter states that he is not at baseline.   - Neurochecks every 4 hours - On dysphagia diet, per SLP recs s/p MBS

## 2023-10-08 NOTE — Progress Notes (Signed)
 Speech Language Pathology Treatment: Dysphagia  Patient Details Name: Arthur [name] MRN: [id number] DOB: March 24, 1940 Today's Date: 10/08/2023 Time: 1209-1232 SLP Time Calculation (min) (ACUTE ONLY): 23 min  Assessment / Plan / Recommendation Clinical Impression  Pt seen with daughter at bedside. SLP reviewed MBS images and recommendations with daughter. Practiced a chin tuck with assist from daughter with boost breeze. Pt would benefit from pharyngeal strengthening and RMST to rehabilitate swallowing at next level of care. Today he is in pain and anxious so education limited to diet compensations - chin tuck, follow solids with liquids. Daughter understands.   HPI HPI: Arthur [name] is an 83 y.o. male who presented to ED on 9/7 due to SOB and somnolence. Imaging concerning for CAP; notable was tracheal debris. Recent admission 8/17-8/26/25 for rotator cuff tear, hypotension, decreased oral intake. PMH Diabetes, hyperlipidemia, hypertension, GERD, PE, CVA, stage IV adenocarcinoma of the lung, DVT, paroxysmal A-fib and duodenal ulcer. Clinical swallowing evaluation on 09/14/23 revealed concerns for primary esophageal dysphagia and recs for esophageal w/u.  Family at bedside reports frequent coughing with PO intake.      SLP Plan  Continue with current plan of care          Recommendations  Diet recommendations: Dysphagia 2 (fine chop);Thin liquid Liquids provided via: Cup;Straw Medication Administration: Crushed with puree Supervision: Full supervision/cueing for compensatory strategies;Trained caregiver to feed patient Compensations: Slow rate;Small sips/bites;Follow solids with liquid Postural Changes and/or Swallow Maneuvers: Chin tuck                  Oral care BID     Dysphagia, oropharyngeal phase (R13.12)     Continue with current plan of care     Hoby Kawai, Consuelo Fitch  10/08/2023, 12:50 PM

## 2023-10-08 NOTE — Progress Notes (Signed)
 Daily Progress Note Intern Pager: [phone]  Patient name: Arthur [name] Medical record number: [id number] Date of birth: 1940/06/18 Age: 83 y.o. Gender: male  Primary Care Provider: Stoney Blizzard, DO Consultants: SLP, PT, Palliative care Code Status: DNR/DNI  Pt Overview and Major Events to Date:  9/8: EEG shows moderate diffuse encephalopathy   Medical Decision Making:  Arthur [name] is a 83 y.o. male with a PMH of A. Fib, CVA with residual L sided deficits, and primary adenocarcinoma of the lung presenting with shortness of breath currently being treated for pneumonia and improving encephalopathy from admission.  Assessment & Plan Pneumonia Imaging consistent with left lower lobe pneumonia, will provide CAP coverage for possible component of aspiration. - Ceftriaxone  1 g daily x5 days (9/7-9/12) and azithromycin  500 mg daily x3 days (completed 9/10) - Blood cultures with no growth for 2 days Encephalopathy Pt more attentive today, significant improvement from first hospitalization day. Occasionally gets confused by questions but AAOx2. Daughter states that he is not at baseline.   - Neurochecks every 4 hours - On dysphagia diet, per SLP recs s/p MBS Paroxysmal atrial fibrillation (HCC) Tachycardic overnight 9/8 and EKG showed A-fib. Pulses have intermittently reached above 110 bpm this morning. Normal rate overnight.  - monitor closely and will add metoprolol if HR sustained over 110 bpm Vomiting (Resolved: 10/08/2023) RLQ and LUQ pain on exam. No further episodes of nausea or vomiting since admission.  CTAP showed possible sequelae of chronic pancreatitis unclear if contributing, but lipase is 12.  Possible component of gastroenteritis given acute onset.   - Will monitor closely for recurrence of symptoms. Chronic health problem Primary adenocarcinoma of lung -  chemotherapy held by recs from outpatient oncology  Anemia - hemoglobin stable. 9.3 today. HLD -  holding rosuvastatin .  FEN/GI: Diet Dys 2 fluid consistency: thin PPx: Lovenox  Dispo:SNF today or tomorrow.   Subjective:  Arthur [name] states that he is feeling ok overall, but is very weak. He states that he is unable to move from his bed to his chair, and that even repositioning himself in the chair is difficult. He states that he is concerned about the prospect of leaving the hospital to a place with less support because he feels he would not be able to perform activities of daily living. Daughter, at bedside, inquires as to whether he can get PT here for a few days, or go back to acute rehab, to regain strength at discharge, instead of going home where is wife is to weak to be able to care for him sufficiently.  Pt is also feeling pleuritic pain and abdominal pain. Denies nausea or vomiting. Denies fever, chills, nasal congestion.   Objective: Temp:  [97.7 F (36.5 C)-99.1 F (37.3 C)] 99.1 F (37.3 C) (09/10 0424) Pulse Rate:  [89-110] 91 (09/10 0424) Resp:  [18-20] 18 (09/10 0424) BP: (114-128)/(50-67) 117/63 (09/10 0424) SpO2:  [95 %-99 %] 98 % (09/10 0424) Physical Exam: General: NAD. AO x 2 Cardiovascular: Regular rate, irregular rhythm Respiratory: diminished breath sounds to LLL and RLL. No crackles or wheezing noted. Good air movement in LUL and R upper and middle lobes Abdomen: soft, nondistended. TTP to LUQ and RLQ. Extremities: no edema.   Laboratory: Most recent CBC Lab Results  Component Value Date   WBC 9.1 10/08/2023   HGB 8.4 (L) 10/08/2023   HCT 25.6 (L) 10/08/2023   MCV 85.9 10/08/2023   PLT 376 10/08/2023   Most recent BMP  Latest Ref Rng & Units 10/07/2023    4:41 AM  BMP  Glucose 70 - 99 mg/dL 893   BUN 8 - 23 mg/dL 13   Creatinine 9.38 - 1.24 mg/dL 9.02   Sodium 864 - 854 mmol/L 138   Potassium 3.5 - 5.1 mmol/L 4.1   Chloride 98 - 111 mmol/L 105   CO2 22 - 32 mmol/L 22   Calcium  8.9 - 10.3 mg/dL 7.9     Other significant labs: RPR -  nonreactive HIV - nonreactive  Imaging/Diagnostic Tests: Radiologist Impression:  Modified Barium Swallow Study  Clinical Impression: Pt demonstrates a severe oropharyngeal dysphagia with silent aspiration of liquids and solid particulate matter. Pt has decreased hyoid excursion and epiglottic deflection with severe pharyngeal residue in the valleculae and pyriform sinuses. There are instances of silent aspiration of residue, particularly with nectar thick liquids. Presence of suspected ostephyte at C6/7 also impacted clearance of bolus through PES with mild backflow. Pt had increased epiglottic deflection in a chin tucked position and could clear otherwise persistent vallecular solid residue with a liquid wash in a chin tucked position. Pt needed lots of instruction to follow this, but with practice and reinforcement from family a chin tuck could be a beneficial adaptive strategy. Thickened liquids are not effective. Pt would benefit from softer, moister solids. Recommend a minced and moist diet (IDDSI 5/Dys 2) with thin liquids with a chin tuck and following solids with liquids. Pt would benefit from f/u with SLP in next setting. to learn strategy and start RMT and pharyngeal strengthening. Already provided some education to family including daughter in law.   DIGEST Swallow Severity Rating*             Safety: 2             Efficiency:3             Overall Pharyngeal Swallow Severity: 3 1: mild; 2: moderate; 3: severe; 4: profound  *The Dynamic Imaging Grade of Swallowing Toxicity is standardized for the head and neck cancer population, however, demonstrates promising clinical applications across populations to standardize the clinical rating of pharyngeal swallow safety and severity.   [name], Medical Student 10/08/2023, 7:19 AM  MS4 AI, [facility] FPTS Intern pager: [phone], text pages welcome Secure chat group [facility] Teaching Service    I have discussed the above with Student Doctor Casimir and agree with the documented plan. My edits for correction/addition/clarification are included above. Please see any attending notes.   Kathrine Melena, DO PGY-2, [facility] 10/08/2023 2:56 PM

## 2023-10-08 NOTE — Discharge Instructions (Addendum)
 Dear Arthur [name],  Thank you for letting us  participate in your care. You were hospitalized for altered mental status and shortness of breath and diagnosed with Pneumonia. You were treated with antibiotics and symptomatic treatment.   POST-HOSPITAL & CARE INSTRUCTIONS Be sure to take all your medications as prescribed in this packet. Go to your follow up appointments (listed below)  DOCTOR'S APPOINTMENT   Future Appointments  Date Time Provider Department Center  10/20/2023 11:30 AM [facility] LAB [facility] None  10/20/2023 12:30 PM [facility]-CT 1 [facility]-CT   10/28/2023 11:30 AM Sherrod Sherrod, MD [facility] None  10/30/2023 11:50 AM [facility] ANNUAL WELLNESS VISIT [facility]    Follow-up Information     [name], [name], DO Follow up.   Specialty: Family Medicine Why: As needed Contact information: [address] [place] [address] [phone]                 Take care and be well!  Family Medicine Teaching Service Inpatient Team [name]  [facility]  [address] [place], [place] [address] [phone]

## 2023-10-08 NOTE — TOC Initial Note (Signed)
 Transition of Care [facility]) - Initial/Assessment Note    Patient Details  Name: Arthur [name] MRN: [id number] Date of Birth: [birth year]  Transition [facility] Care [facility]) CM/SW Contact:    [name], LCSW Phone Number: 10/08/2023, 2:48 PM  Clinical Narrative:       CSW spoke with son, Vipul, and met with patient, spouse, and daughter in law, [name], at bedside to discuss disposition. Patient and family in agreement with patient returning to [facility] for rehab. CSW contacted [place], bed is still available for the patient. CSW contacted CMA to initiate insurance authorization. CSW updated MD with barrier to discharge. CSW to follow.            Expected Discharge Plan: Skilled Nursing Facility Barriers to Discharge: Insurance Authorization, Continued Medical Work up   Patient Goals and CMS Choice Patient states their goals for this hospitalization and ongoing recovery are:: continue with rehab CMS Medicare.gov Compare Post Acute Care list provided to:: Patient Choice offered to / list presented to : Patient, Adult Children [place] ownership interest in [facility].provided to:: Adult Children    Expected Discharge Plan and Services     Post Acute Care Choice: Skilled Nursing Facility Living arrangements for the past 2 months: Single Family Home, Skilled Nursing Facility                                      Prior Living Arrangements/Services Living arrangements for the past 2 months: Single Family Home, Skilled Nursing Facility Lives with:: Spouse Patient language and need for interpreter reviewed:: No Do you feel safe going back to the place where you live?: Yes      Need for Family Participation in Patient Care: Yes (Comment) Care giver support system in place?: Yes (comment)   Criminal Activity/Legal Involvement Pertinent to Current Situation/Hospitalization: No - Comment as needed  Activities of Daily Living      Permission Sought/Granted Permission  sought to share information with : Facility Medical sales representative, Family Supports Permission granted to share information with : Yes, Verbal Permission Granted  Share Information with NAME: Vipul, [name]  Permission granted to share info w AGENCY: SNF  Permission granted to share info w Relationship: Son, DIL     Emotional Assessment Appearance:: Appears stated age Attitude/Demeanor/Rapport: Engaged Affect (typically observed): Appropriate Orientation: : Oriented to Self, Oriented to Place, Oriented to  Time, Oriented to Situation Alcohol / Substance Use: Not Applicable Psych Involvement: No (comment)  Admission diagnosis:  Pneumonia [J18.9] Pneumonia of left lower lobe due to infectious organism [J18.9] Vomiting, unspecified vomiting type, unspecified whether nausea present [R11.10] Patient Active Problem List   Diagnosis Date Noted   Pneumonia 10/06/2023   Encephalopathy 10/06/2023   Malnutrition of moderate degree 09/20/2023   Nontraumatic tear of left rotator cuff 09/19/2023   Decreased oral intake 09/17/2023   Chronic health problem 09/15/2023   Nontraumatic complete tear of left rotator cuff 09/15/2023   Left shoulder pain 09/14/2023   Duodenal ulcer 10/20/2022   Chronic anticoagulation 10/19/2022   Paroxysmal atrial fibrillation (HCC) 10/18/2022   Renal insufficiency 07/04/2021   Foot drop, left foot 07/04/2021   Non-small cell carcinoma of lung, stage 4 (HCC) 06/15/2021   Encounter for antineoplastic chemotherapy 06/13/2021   Deep venous embolism and thrombosis (HCC) 05/16/2021   Inguinal hernia 05/16/2021   Primary adenocarcinoma of lung (HCC) 04/09/2021   Cerebral embolism with cerebral infarction 03/18/2021  Status post coronary artery stent placement    Coronary artery disease due to lipid rich plaque    Pulmonary embolus (HCC) 02/03/2021   Weight loss 03/08/2020   Anemia 09/06/2015   Hematoma 09/09/2013   GERD (gastroesophageal reflux disease) 07/05/2013    Weakness 01/20/2012   Arthritis 02/26/2010   Gout 11/06/2009   Diabetes mellitus, type II (HCC) 09/22/2009   Hyperlipidemia 09/22/2009   Essential hypertension, benign 09/22/2009   PCP:  Stoney Blizzard, DO Pharmacy:   [facility] [address], [place] - [address] [place] [address] Phone: [phone] Fax: [phone]  [facility] [address] [place] [address] Phone: [phone] Fax: [phone]  [facility] - [facility] [address] [place] [address] Phone: [phone] Fax: [phone]  [facility] [address] [place] - [address] [place] [address] Phone: [phone] Fax: [phone]  [facility] [place], [place] - [address] [place] [address] [place] [address] [place] [address] Phone: [phone] Fax: [phone]     Social Drivers of Health (SDOH) Social History: SDOH Screenings   Food Insecurity: No Food Insecurity (10/06/2023)  Housing: Low Risk  (10/06/2023)  Transportation Needs: No Transportation Needs (10/06/2023)  Utilities: Not At Risk (10/06/2023)  Alcohol Screen: Low Risk  (10/28/2022)  Depression (PHQ2-9): Low Risk  (06/25/2023)  Financial Resource Strain: Low Risk  (10/28/2022)  Physical Activity: Insufficiently Active (10/28/2022)  Social Connections: Moderately Integrated (10/06/2023)  Stress: No Stress Concern Present (10/28/2022)  Tobacco Use: Low Risk  (10/05/2023)  Health Literacy: Adequate Health Literacy (10/28/2022)   SDOH Interventions:     Readmission Risk Interventions    06/19/2021    4:57 PM  Readmission Risk Prevention Plan  Transportation Screening Complete  PCP or Specialist Appt within 3-5 Days Complete  HRI or Home Care Consult Complete  Social Work Consult for Recovery Care Planning/Counseling Complete  Medication Review Oceanographer) Complete

## 2023-10-08 NOTE — Assessment & Plan Note (Addendum)
 Tachycardic overnight 9/8 and EKG showed A-fib. Pulses have intermittently reached above 110 bpm this morning. Normal rate overnight.  - monitor closely and will add metoprolol if HR sustained over 110 bpm

## 2023-10-08 NOTE — Assessment & Plan Note (Addendum)
 Imaging consistent with left lower lobe pneumonia, will provide CAP coverage for possible component of aspiration. - Ceftriaxone  1 g daily x5 days (9/7-9/12) and azithromycin  500 mg daily x3 days (completed 9/10) - Blood cultures with no growth for 2 days

## 2023-10-08 NOTE — Assessment & Plan Note (Addendum)
 RLQ and LUQ pain on exam. No further episodes of nausea or vomiting since admission.  CTAP showed possible sequelae of chronic pancreatitis unclear if contributing, but lipase is 12.  Possible component of gastroenteritis given acute onset.   - Will monitor closely for recurrence of symptoms.

## 2023-10-09 ENCOUNTER — Other Ambulatory Visit (HOSPITAL_COMMUNITY): Payer: Self-pay

## 2023-10-09 DIAGNOSIS — J189 Pneumonia, unspecified organism: Secondary | ICD-10-CM | POA: Diagnosis not present

## 2023-10-09 MED ORDER — SENNA 8.6 MG PO TABS: 1.0000 | ORAL_TABLET | Freq: Every day | ORAL | Status: DC

## 2023-10-09 MED ORDER — POLYETHYLENE GLYCOL 3350 17 G PO PACK: 17.0000 g | PACK | Freq: Every day | ORAL | Status: DC

## 2023-10-09 MED ORDER — SENNA 8.6 MG PO TABS
1.0000 | ORAL_TABLET | Freq: Every day | ORAL | Status: DC
  Administered 2023-10-09: 8.6 mg via ORAL
  Filled 2023-10-09: qty 1

## 2023-10-09 NOTE — Assessment & Plan Note (Addendum)
 Tachycardic overnight 9/8 and EKG showed A-fib. Pulses have intermittently reached above 110 bpm this morning. Normal rate overnight.  - monitor closely and will add metoprolol if HR sustained over 110 bpm - Lovenox  for anticoagulation given history of multiple VTE

## 2023-10-09 NOTE — TOC Progression Note (Signed)
 Transition of Care [facility]) - Progression Note    Patient Details  Name: Arthur [name] MRN: [id number] Date of Birth: [birth year]  Transition [facility]) CM/SW Contact  [name], [place] Phone Number: 10/09/2023, 4:14 PM  Clinical Narrative:   [name] insurance remains pending at this time, CSW checked on status throughout the day. CMA contacted [facility] around 1:45 that patient was ready for discharge and awaiting approval, but request has still not been reviewed. CSW updated [name], MD, and patient's family. CSW to continue to follow.    Expected Discharge Plan: Skilled Nursing Facility Barriers to Discharge: Insurance Authorization               Expected Discharge Plan and Services     Post Acute Care Choice: Skilled Nursing Facility Living arrangements for the past 2 months: Single Family Home, Skilled Nursing Facility                                       Social Drivers of Health (SDOH) Interventions SDOH Screenings   Food Insecurity: No Food Insecurity (10/06/2023)  Housing: Low Risk  (10/06/2023)  Transportation Needs: No Transportation Needs (10/06/2023)  Utilities: Not At Risk (10/06/2023)  Alcohol Screen: Low Risk  (10/28/2022)  Depression (PHQ2-9): Low Risk  (06/25/2023)  Financial Resource Strain: Low Risk  (10/28/2022)  Physical Activity: Insufficiently Active (10/28/2022)  Social Connections: Moderately Integrated (10/06/2023)  Stress: No Stress Concern Present (10/28/2022)  Tobacco Use: Low Risk  (10/05/2023)  Health Literacy: Adequate Health Literacy (10/28/2022)    Readmission Risk Interventions    06/19/2021    4:57 PM  Readmission Risk Prevention Plan  Transportation Screening Complete  PCP or Specialist Appt within 3-5 Days Complete  HRI or Home Care Consult Complete  Social Work Consult for Recovery Care Planning/Counseling Complete  Medication Review Oceanographer) Complete

## 2023-10-09 NOTE — Plan of Care (Addendum)
 Spoke with Dr. Graig from [place] [employer] concerning patient's authorization for return to [facility], authorization [id number].  He has been approved for SNF.  We discussed patient's improvement over hospitalization.  We discussed he is now alert and oriented x 3.  He is on room air and status post antibiotics for pneumonia.  He is atrial fibrillation has remained rate controlled.  He has been treated for constipation which likely explained his right lower quadrant and left upper quadrant pain on exam given normal lipase and improvement with senna in the hospital; gastroenteritis would not be expected to have such a swift disease course.    Lastly, we discussed patient's left shoulder pain which is due to a previously discovered intramuscular hematoma of the subscapularis muscle back in August 2025.  I relayed that when I saw this patient this morning, he was able to recount and demonstrate the exercises that the physical therapist had recommended to him.  I also relayed that patient was motivated to regain his strength, and SNF is the best place for him to do this.  We discussed his pain regimen of Tylenol , lidocaine  patches, and oxycodone  to aid in his movement with PT.

## 2023-10-09 NOTE — Assessment & Plan Note (Addendum)
 Imaging consistent with left lower lobe pneumonia, will provide CAP coverage for possible component of aspiration. - Ceftriaxone  1 g daily x5 days (9/7-9/11). Last day of azithromycin  500 mg was yesterday (completed 9/10) - Blood cultures with no growth for 3 days

## 2023-10-09 NOTE — Assessment & Plan Note (Addendum)
 Primary adenocarcinoma of lung -  chemotherapy held by recs from outpatient oncology  Anemia - hemoglobin stable. 9.3 last. HLD - rosuvastatin  restarted 9/9

## 2023-10-09 NOTE — Assessment & Plan Note (Addendum)
 Pt attentive today, significant improvement from first hospitalization day. Aox3.  No confusion with any questions today. - On dysphagia diet, per SLP recs s/p MBS

## 2023-10-09 NOTE — Progress Notes (Addendum)
 Daily Progress Note Intern Pager: [phone]  Patient name: Arthur [name] Medical record number: [id number] Date of birth: December 20, 1940 Age: 83 y.o. Gender: male  Primary Care Provider: Stoney Blizzard, DO Consultants: SLP, PT, Palliative Care Code Status: DNR/DNI  Pt Overview and Major Events to Date:  9/8: EEG shows moderate diffuse encephalopathy   Medical Decision Making:  Arthur [name] is a 83 y.o. male with a PMH of A. Fib, CVA with residual L sided deficits, and primary adenocarcinoma of the lung presenting with shortness of breath currently being treated for pneumonia and improving encephalopathy from admission.  Assessment & Plan Encephalopathy Pt attentive today, significant improvement from first hospitalization day. Aox3.  No confusion with any questions today. - On dysphagia diet, per SLP recs s/p MBS Constipation Pt is unsure whether he has had bowel movement since admission. TTP in LLQ and suprapubic area. Pt is not on a scheduled bowel regimen currently.  - senna 8.6mg   Pneumonia Imaging consistent with left lower lobe pneumonia, will provide CAP coverage for possible component of aspiration. - Ceftriaxone  1 g daily x5 days (9/7-9/11). Last day of azithromycin  500 mg was yesterday (completed 9/10) - Blood cultures with no growth for 3 days Paroxysmal atrial fibrillation (HCC) Tachycardic overnight 9/8 and EKG showed A-fib. Pulses have intermittently reached above 110 bpm this morning. Normal rate overnight.  - monitor closely and will add metoprolol if HR sustained over 110 bpm - Lovenox  for anticoagulation given history of multiple VTE  Chronic health problem Primary adenocarcinoma of lung -  chemotherapy held by recs from outpatient oncology  Anemia - hemoglobin stable. 9.3 last. HLD - rosuvastatin  restarted 9/9   FEN/GI: Diet dys 2 fluid consistency: thin PPx: Lovenox  Dispo:SNF when a bed is available, possibly today  Subjective:  Pt is feeling  well overall. He denies dyspnea, chest pain. Pt states that he is unsure when the last time he had a bowel movement was. He states that last night he thought he had a bowel movement but when the nurse came to change him he did not have a movement. States that he has not had any difficulty with urination. He states that his family is working on getting SNF placement for him.  Objective: Temp:  [97.5 F (36.4 C)-98.6 F (37 C)] 98 F (36.7 C) (09/11 0757) Pulse Rate:  [91-102] 93 (09/11 0757) Resp:  [18-19] 18 (09/11 0757) BP: (105-118)/(47-65) 118/65 (09/11 0757) SpO2:  [96 %-100 %] 96 % (09/11 0757) Physical Exam: General: NAD. Nontoxic appearing, AOx3  Cardiovascular: regular rate and rhythm Respiratory: No wheezes present. LLL with crackles. Remainder of exam unremarkable. Abdomen: soft. Tender to deep palpation to suprapubic area and LLQ. No other tenderness noted Extremities: moving limbs normally according to baseline. No edema noted.  Laboratory: Most recent CBC Lab Results  Component Value Date   WBC 9.1 10/08/2023   HGB 8.4 (L) 10/08/2023   HCT 25.6 (L) 10/08/2023   MCV 85.9 10/08/2023   PLT 376 10/08/2023   Most recent BMP    Latest Ref Rng & Units 10/07/2023    4:41 AM  BMP  Glucose 70 - 99 mg/dL 893   BUN 8 - 23 mg/dL 13   Creatinine 9.38 - 1.24 mg/dL 9.02   Sodium 864 - 854 mmol/L 138   Potassium 3.5 - 5.1 mmol/L 4.1   Chloride 98 - 111 mmol/L 105   CO2 22 - 32 mmol/L 22   Calcium  8.9 - 10.3 mg/dL 7.9  Arthur [name], Medical Student 10/09/2023, 9:44 AM  MS4 AI, [place] [facility] FPTS Intern pager: [phone], text pages welcome Secure chat group [facility] Teaching Service   I agree with the assessment and plan as documented above.  Arthur Redo, MD PGY-3, [facility]

## 2023-10-09 NOTE — Care Management Important Message (Signed)
 Important Message  Patient Details  Name: Arthur [name] MRN: [id number] Date of Birth: 09/21/1940   Important Message Given:  Yes - Medicare IM     Claretta Deed 10/09/2023, 4:36 PM

## 2023-10-09 NOTE — Assessment & Plan Note (Addendum)
 Pt is unsure whether he has had bowel movement since admission. TTP in LLQ and suprapubic area. Pt is not on a scheduled bowel regimen currently.  - senna 8.6mg 

## 2023-10-10 ENCOUNTER — Ambulatory Visit: Payer: Self-pay | Admitting: Student

## 2023-10-10 DIAGNOSIS — R2689 Other abnormalities of gait and mobility: Secondary | ICD-10-CM | POA: Diagnosis not present

## 2023-10-10 DIAGNOSIS — Z743 Need for continuous supervision: Secondary | ICD-10-CM | POA: Diagnosis not present

## 2023-10-10 DIAGNOSIS — Z515 Encounter for palliative care: Secondary | ICD-10-CM | POA: Diagnosis not present

## 2023-10-10 DIAGNOSIS — Z7902 Long term (current) use of antithrombotics/antiplatelets: Secondary | ICD-10-CM | POA: Diagnosis not present

## 2023-10-10 DIAGNOSIS — Z7901 Long term (current) use of anticoagulants: Secondary | ICD-10-CM | POA: Diagnosis not present

## 2023-10-10 DIAGNOSIS — T50905D Adverse effect of unspecified drugs, medicaments and biological substances, subsequent encounter: Secondary | ICD-10-CM | POA: Diagnosis not present

## 2023-10-10 DIAGNOSIS — M75122 Complete rotator cuff tear or rupture of left shoulder, not specified as traumatic: Secondary | ICD-10-CM | POA: Diagnosis not present

## 2023-10-10 DIAGNOSIS — Z7401 Bed confinement status: Secondary | ICD-10-CM | POA: Diagnosis not present

## 2023-10-10 DIAGNOSIS — G9340 Encephalopathy, unspecified: Secondary | ICD-10-CM | POA: Diagnosis not present

## 2023-10-10 DIAGNOSIS — D649 Anemia, unspecified: Secondary | ICD-10-CM | POA: Diagnosis not present

## 2023-10-10 DIAGNOSIS — Z86711 Personal history of pulmonary embolism: Secondary | ICD-10-CM | POA: Diagnosis not present

## 2023-10-10 DIAGNOSIS — S91302A Unspecified open wound, left foot, initial encounter: Secondary | ICD-10-CM | POA: Diagnosis not present

## 2023-10-10 DIAGNOSIS — M6281 Muscle weakness (generalized): Secondary | ICD-10-CM | POA: Diagnosis not present

## 2023-10-10 DIAGNOSIS — D84821 Immunodeficiency due to drugs: Secondary | ICD-10-CM | POA: Diagnosis not present

## 2023-10-10 DIAGNOSIS — I951 Orthostatic hypotension: Secondary | ICD-10-CM | POA: Diagnosis not present

## 2023-10-10 DIAGNOSIS — R29898 Other symptoms and signs involving the musculoskeletal system: Secondary | ICD-10-CM | POA: Diagnosis not present

## 2023-10-10 DIAGNOSIS — J189 Pneumonia, unspecified organism: Secondary | ICD-10-CM | POA: Diagnosis not present

## 2023-10-10 DIAGNOSIS — I69952 Hemiplegia and hemiparesis following unspecified cerebrovascular disease affecting left dominant side: Secondary | ICD-10-CM | POA: Diagnosis not present

## 2023-10-10 DIAGNOSIS — Z8709 Personal history of other diseases of the respiratory system: Secondary | ICD-10-CM | POA: Diagnosis not present

## 2023-10-10 DIAGNOSIS — Z7189 Other specified counseling: Secondary | ICD-10-CM | POA: Diagnosis not present

## 2023-10-10 DIAGNOSIS — Z86718 Personal history of other venous thrombosis and embolism: Secondary | ICD-10-CM | POA: Diagnosis not present

## 2023-10-10 DIAGNOSIS — E119 Type 2 diabetes mellitus without complications: Secondary | ICD-10-CM | POA: Diagnosis not present

## 2023-10-10 DIAGNOSIS — R5383 Other fatigue: Secondary | ICD-10-CM | POA: Diagnosis not present

## 2023-10-10 DIAGNOSIS — I69391 Dysphagia following cerebral infarction: Secondary | ICD-10-CM | POA: Diagnosis not present

## 2023-10-10 DIAGNOSIS — R1310 Dysphagia, unspecified: Secondary | ICD-10-CM | POA: Diagnosis not present

## 2023-10-10 DIAGNOSIS — C3492 Malignant neoplasm of unspecified part of left bronchus or lung: Secondary | ICD-10-CM | POA: Diagnosis not present

## 2023-10-10 DIAGNOSIS — R0989 Other specified symptoms and signs involving the circulatory and respiratory systems: Secondary | ICD-10-CM | POA: Diagnosis not present

## 2023-10-10 DIAGNOSIS — I480 Paroxysmal atrial fibrillation: Secondary | ICD-10-CM | POA: Diagnosis not present

## 2023-10-10 DIAGNOSIS — I69354 Hemiplegia and hemiparesis following cerebral infarction affecting left non-dominant side: Secondary | ICD-10-CM | POA: Diagnosis not present

## 2023-10-10 DIAGNOSIS — C3490 Malignant neoplasm of unspecified part of unspecified bronchus or lung: Secondary | ICD-10-CM | POA: Diagnosis not present

## 2023-10-10 MED ORDER — OXYCODONE HCL 5 MG PO TABS: 2.5000 mg | ORAL_TABLET | Freq: Four times a day (QID) | ORAL | 0 refills | Status: DC | PRN

## 2023-10-10 NOTE — Plan of Care (Signed)

## 2023-10-10 NOTE — TOC Transition Note (Signed)
 Transition of Care [facility]) - Discharge Note   Patient Details  Name: Arthur [name] MRN: [id number] Date of Birth: 02-Jun-1940  Transition of Care [facility]) CM/SW Contact:  [name], LCSW Phone Number: 10/10/2023, 10:07 AM   Clinical Narrative:   Patient received insurance approval, and [name] has a bed available for patient today. Patient medically stable. CSW sent discharge information to [facility], confirmed receipt. CSW spoke with daughter in law, [name], and family is in agreement, headed over to [place] for paperwork. Transport arranged with [facility] for next available.  Nurse to call report to [phone], Room 208P.    Final next level of care: Skilled Nursing Facility Barriers to Discharge: Barriers Resolved   Patient Goals and CMS Choice Patient states their goals for this hospitalization and ongoing recovery are:: continue with rehab CMS Medicare.gov Compare Post Acute Care list provided to:: Patient Choice offered to / list presented to : Patient, Adult Children [place] ownership interest in [facility].provided to:: Adult Children    Discharge Placement              Patient chooses bed at: [facility] Patient to be transferred to facility by: [facility] Name of family member notified: [name] and family notified of of transfer: 10/10/23  Discharge Plan and Services Additional resources added to the After Visit Summary for       Post Acute Care Choice: Skilled Nursing Facility                               Social Drivers of Health (SDOH) Interventions SDOH Screenings   Food Insecurity: No Food Insecurity (10/06/2023)  Housing: Low Risk  (10/06/2023)  Transportation Needs: No Transportation Needs (10/06/2023)  Utilities: Not At Risk (10/06/2023)  Alcohol Screen: Low Risk  (10/28/2022)  Depression (PHQ2-9): Low Risk  (06/25/2023)  Financial Resource Strain: Low Risk  (10/28/2022)  Physical Activity: Insufficiently Active (10/28/2022)  Social  Connections: Moderately Integrated (10/06/2023)  Stress: No Stress Concern Present (10/28/2022)  Tobacco Use: Low Risk  (10/05/2023)  Health Literacy: Adequate Health Literacy (10/28/2022)     Readmission Risk Interventions    06/19/2021    4:57 PM  Readmission Risk Prevention Plan  Transportation Screening Complete  PCP or Specialist Appt within 3-5 Days Complete  HRI or Home Care Consult Complete  Social Work Consult for Recovery Care Planning/Counseling Complete  Medication Review [employer]) Complete

## 2023-10-10 NOTE — Progress Notes (Incomplete)
 Patient ID: Arthur [name], male   DOB: 1940/02/20, 83 y.o.   MRN: [id number]   Report called to [facility] and given to [facility]. No further questions at this time. Leaving by [facility].  Verdie JONETTA Collier, RN

## 2023-10-10 NOTE — Assessment & Plan Note (Deleted)
 Tachycardic overnight 9/8 and EKG showed A-fib. Pulses have intermittently reached tachycardic rates, but primarily have been within normal limits.  - monitor closely and will add metoprolol if HR sustained over 110 bpm - Lovenox  for anticoagulation given history of multiple VTE

## 2023-10-10 NOTE — Assessment & Plan Note (Deleted)
 Imaging consistent with left lower lobe pneumonia, will provide CAP coverage for possible component of aspiration. Pt states  - Ceftriaxone  and  azithromycin  courses completed.

## 2023-10-10 NOTE — Assessment & Plan Note (Deleted)
 Primary adenocarcinoma of lung -  chemotherapy held by recs from outpatient oncology  Anemia - hemoglobin stable. 9.3 last. HLD - rosuvastatin  restarted 9/9

## 2023-10-10 NOTE — Assessment & Plan Note (Deleted)
 Pt is unsure whether he has had bowel movement since admission. TTP in LLQ and suprapubic area. Pt is not on a scheduled bowel regimen currently. Pt refused miralax  yesterday.  - senna 8.6mg  and Miralax  17mg 

## 2023-10-10 NOTE — Assessment & Plan Note (Deleted)
 Pt attentive today, significant improvement from first hospitalization day. Aox3.  No confusion with any questions today. - On dysphagia diet, per SLP recs s/p MBS

## 2023-10-10 NOTE — Progress Notes (Signed)
 Palliative:  HPI: 83 y.o. male  with past medical history of stage IV lung cancer, CVA with residual left-sided deficits, diabetes type 2, DVT/PE on anticoagulation, persistent Afib, HTN, HLD, GERD admitted on 10/05/2023 with LLL pneumonia.   Reviewed notes. Discussed with CSW. Plans for d/c to SNF rehab. Arthur [name] is much improved from admission. Hoping for return home after SNF rehab stay. He requests Boost and socks which I bring for him. He is requesting his medications - RN brings to bedside as I am messaging her to bring medications. Intake has improved. Mental status has improved. Working with PT/OT. Good spirits. Transport to bedside to take to SNF. No complaints. Pain is controlled.   All questions/concerns addressed. Emotional support provided.   Exam: Alert, oriented. Good spirits. No distress. Breathing regular, unlabored. Abd soft. Generalized weakness.   Plan: - DNR - Continue with treatment per Dr. Sherrod - Follow up with palliative NP at [facility] - SNF rehab with hopes to ultimately return home  25 min  Bernarda Kitty, NP Palliative Medicine Team Pager [phone] (Please see [email or web address] for schedule) Team Phone [phone]

## 2023-10-11 LAB — CULTURE, BLOOD (ROUTINE X 2)
Culture: NO GROWTH
Culture: NO GROWTH
Special Requests: ADEQUATE

## 2023-10-13 DIAGNOSIS — Z86711 Personal history of pulmonary embolism: Secondary | ICD-10-CM | POA: Diagnosis not present

## 2023-10-13 DIAGNOSIS — C3490 Malignant neoplasm of unspecified part of unspecified bronchus or lung: Secondary | ICD-10-CM | POA: Diagnosis not present

## 2023-10-13 DIAGNOSIS — C3492 Malignant neoplasm of unspecified part of left bronchus or lung: Secondary | ICD-10-CM | POA: Diagnosis not present

## 2023-10-13 DIAGNOSIS — I480 Paroxysmal atrial fibrillation: Secondary | ICD-10-CM | POA: Diagnosis not present

## 2023-10-13 DIAGNOSIS — I69952 Hemiplegia and hemiparesis following unspecified cerebrovascular disease affecting left dominant side: Secondary | ICD-10-CM | POA: Diagnosis not present

## 2023-10-13 DIAGNOSIS — Z7902 Long term (current) use of antithrombotics/antiplatelets: Secondary | ICD-10-CM | POA: Diagnosis not present

## 2023-10-13 DIAGNOSIS — Z7901 Long term (current) use of anticoagulants: Secondary | ICD-10-CM | POA: Diagnosis not present

## 2023-10-13 DIAGNOSIS — D84821 Immunodeficiency due to drugs: Secondary | ICD-10-CM | POA: Diagnosis not present

## 2023-10-13 DIAGNOSIS — T50905D Adverse effect of unspecified drugs, medicaments and biological substances, subsequent encounter: Secondary | ICD-10-CM | POA: Diagnosis not present

## 2023-10-13 DIAGNOSIS — I951 Orthostatic hypotension: Secondary | ICD-10-CM | POA: Diagnosis not present

## 2023-10-13 DIAGNOSIS — M6281 Muscle weakness (generalized): Secondary | ICD-10-CM | POA: Diagnosis not present

## 2023-10-13 DIAGNOSIS — D649 Anemia, unspecified: Secondary | ICD-10-CM | POA: Diagnosis not present

## 2023-10-13 DIAGNOSIS — G9340 Encephalopathy, unspecified: Secondary | ICD-10-CM | POA: Diagnosis not present

## 2023-10-13 DIAGNOSIS — M75122 Complete rotator cuff tear or rupture of left shoulder, not specified as traumatic: Secondary | ICD-10-CM | POA: Diagnosis not present

## 2023-10-14 ENCOUNTER — Other Ambulatory Visit: Payer: Self-pay

## 2023-10-15 ENCOUNTER — Inpatient Hospital Stay: Admitting: Physician Assistant

## 2023-10-15 DIAGNOSIS — I69952 Hemiplegia and hemiparesis following unspecified cerebrovascular disease affecting left dominant side: Secondary | ICD-10-CM | POA: Diagnosis not present

## 2023-10-15 DIAGNOSIS — C3490 Malignant neoplasm of unspecified part of unspecified bronchus or lung: Secondary | ICD-10-CM | POA: Diagnosis not present

## 2023-10-15 DIAGNOSIS — M75122 Complete rotator cuff tear or rupture of left shoulder, not specified as traumatic: Secondary | ICD-10-CM | POA: Diagnosis not present

## 2023-10-15 DIAGNOSIS — D649 Anemia, unspecified: Secondary | ICD-10-CM | POA: Diagnosis not present

## 2023-10-15 DIAGNOSIS — M6281 Muscle weakness (generalized): Secondary | ICD-10-CM | POA: Diagnosis not present

## 2023-10-17 ENCOUNTER — Telehealth: Payer: Self-pay

## 2023-10-17 NOTE — Telephone Encounter (Signed)
 Spoke with [name] regarding patient's upcoming appointments. Patient is currently residing at [facility] with an expected discharge date of 10/26/23. [name] requested to cancel appointments scheduled for 10/20/23 and 10/28/23, as discharge timing is uncertain. She stated she will call to reschedule once the patient is home. Informed [name] that she may call centralized scheduling to reschedule the CT scan and notify our office so we can arrange a follow-up visit with Dr. Sherrod approximately one week after CT scan.  Also reminded [name] of the dose reduction for Alecensa  per MyChart message. She requested that I notify [facility] of the dose adjustment.  Spoke with Marjorie at [facility] [phone]) and informed her that the patient's Alecensa  dose is 450 mg by mouth twice daily (3 pills in the morning and 3 pills in the evening). Marjorie voiced understanding.

## 2023-10-20 ENCOUNTER — Telehealth: Payer: Self-pay

## 2023-10-20 ENCOUNTER — Other Ambulatory Visit: Payer: Self-pay

## 2023-10-20 ENCOUNTER — Inpatient Hospital Stay: Attending: Physician Assistant

## 2023-10-20 ENCOUNTER — Ambulatory Visit (HOSPITAL_COMMUNITY)

## 2023-10-20 DIAGNOSIS — I69952 Hemiplegia and hemiparesis following unspecified cerebrovascular disease affecting left dominant side: Secondary | ICD-10-CM | POA: Diagnosis not present

## 2023-10-20 DIAGNOSIS — M75122 Complete rotator cuff tear or rupture of left shoulder, not specified as traumatic: Secondary | ICD-10-CM | POA: Diagnosis not present

## 2023-10-20 DIAGNOSIS — M6281 Muscle weakness (generalized): Secondary | ICD-10-CM | POA: Diagnosis not present

## 2023-10-20 DIAGNOSIS — C3490 Malignant neoplasm of unspecified part of unspecified bronchus or lung: Secondary | ICD-10-CM | POA: Diagnosis not present

## 2023-10-20 MED ORDER — ALECENSA 150 MG PO CAPS: 450.0000 mg | ORAL_CAPSULE | Freq: Two times a day (BID) | ORAL | 3 refills | Status: DC

## 2023-10-20 NOTE — Telephone Encounter (Signed)
 Received a call from [place] at [facility] regarding the patient's medication, Alecensa . Apolinar inquired about the current dosage. I informed her that Dr. Sherrod recently reduced the dose to 450 mg by mouth twice daily, which is equivalent to 3 capsules in the morning and 3 capsules in the evening. Apolinar voiced understanding.

## 2023-10-20 NOTE — Telephone Encounter (Signed)
 Faxed new prescription request of Alecensa  to [facility] with confirmation at [phone].

## 2023-10-22 DIAGNOSIS — M75122 Complete rotator cuff tear or rupture of left shoulder, not specified as traumatic: Secondary | ICD-10-CM | POA: Diagnosis not present

## 2023-10-22 DIAGNOSIS — I69952 Hemiplegia and hemiparesis following unspecified cerebrovascular disease affecting left dominant side: Secondary | ICD-10-CM | POA: Diagnosis not present

## 2023-10-22 DIAGNOSIS — M6281 Muscle weakness (generalized): Secondary | ICD-10-CM | POA: Diagnosis not present

## 2023-10-22 DIAGNOSIS — C3490 Malignant neoplasm of unspecified part of unspecified bronchus or lung: Secondary | ICD-10-CM | POA: Diagnosis not present

## 2023-10-27 DIAGNOSIS — Z7902 Long term (current) use of antithrombotics/antiplatelets: Secondary | ICD-10-CM | POA: Diagnosis not present

## 2023-10-27 DIAGNOSIS — C3490 Malignant neoplasm of unspecified part of unspecified bronchus or lung: Secondary | ICD-10-CM | POA: Diagnosis not present

## 2023-10-27 DIAGNOSIS — I480 Paroxysmal atrial fibrillation: Secondary | ICD-10-CM | POA: Diagnosis not present

## 2023-10-27 DIAGNOSIS — S91302A Unspecified open wound, left foot, initial encounter: Secondary | ICD-10-CM | POA: Diagnosis not present

## 2023-10-27 DIAGNOSIS — Z86718 Personal history of other venous thrombosis and embolism: Secondary | ICD-10-CM | POA: Diagnosis not present

## 2023-10-27 DIAGNOSIS — D649 Anemia, unspecified: Secondary | ICD-10-CM | POA: Diagnosis not present

## 2023-10-27 DIAGNOSIS — R0989 Other specified symptoms and signs involving the circulatory and respiratory systems: Secondary | ICD-10-CM | POA: Diagnosis not present

## 2023-10-27 DIAGNOSIS — R1310 Dysphagia, unspecified: Secondary | ICD-10-CM | POA: Diagnosis not present

## 2023-10-27 DIAGNOSIS — Z7901 Long term (current) use of anticoagulants: Secondary | ICD-10-CM | POA: Diagnosis not present

## 2023-10-27 DIAGNOSIS — Z86711 Personal history of pulmonary embolism: Secondary | ICD-10-CM | POA: Diagnosis not present

## 2023-10-27 DIAGNOSIS — I69391 Dysphagia following cerebral infarction: Secondary | ICD-10-CM | POA: Diagnosis not present

## 2023-10-27 DIAGNOSIS — M75122 Complete rotator cuff tear or rupture of left shoulder, not specified as traumatic: Secondary | ICD-10-CM | POA: Diagnosis not present

## 2023-10-27 DIAGNOSIS — M6281 Muscle weakness (generalized): Secondary | ICD-10-CM | POA: Diagnosis not present

## 2023-10-27 DIAGNOSIS — I69952 Hemiplegia and hemiparesis following unspecified cerebrovascular disease affecting left dominant side: Secondary | ICD-10-CM | POA: Diagnosis not present

## 2023-10-27 DIAGNOSIS — C3492 Malignant neoplasm of unspecified part of left bronchus or lung: Secondary | ICD-10-CM | POA: Diagnosis not present

## 2023-10-28 ENCOUNTER — Telehealth: Admitting: Internal Medicine

## 2023-10-28 DIAGNOSIS — R0989 Other specified symptoms and signs involving the circulatory and respiratory systems: Secondary | ICD-10-CM | POA: Diagnosis not present

## 2023-10-28 DIAGNOSIS — I69391 Dysphagia following cerebral infarction: Secondary | ICD-10-CM | POA: Diagnosis not present

## 2023-10-28 DIAGNOSIS — Z7901 Long term (current) use of anticoagulants: Secondary | ICD-10-CM | POA: Diagnosis not present

## 2023-10-28 DIAGNOSIS — E119 Type 2 diabetes mellitus without complications: Secondary | ICD-10-CM | POA: Diagnosis not present

## 2023-10-28 DIAGNOSIS — Z8709 Personal history of other diseases of the respiratory system: Secondary | ICD-10-CM | POA: Diagnosis not present

## 2023-10-28 DIAGNOSIS — I69354 Hemiplegia and hemiparesis following cerebral infarction affecting left non-dominant side: Secondary | ICD-10-CM | POA: Diagnosis not present

## 2023-10-28 DIAGNOSIS — R1310 Dysphagia, unspecified: Secondary | ICD-10-CM | POA: Diagnosis not present

## 2023-10-28 DIAGNOSIS — C3490 Malignant neoplasm of unspecified part of unspecified bronchus or lung: Secondary | ICD-10-CM | POA: Diagnosis not present

## 2023-10-29 ENCOUNTER — Other Ambulatory Visit: Payer: Self-pay

## 2023-10-29 ENCOUNTER — Other Ambulatory Visit: Payer: Self-pay | Admitting: *Deleted

## 2023-10-29 ENCOUNTER — Other Ambulatory Visit (HOSPITAL_COMMUNITY): Payer: Self-pay

## 2023-10-29 ENCOUNTER — Other Ambulatory Visit: Payer: Self-pay | Admitting: Internal Medicine

## 2023-10-29 DIAGNOSIS — C3491 Malignant neoplasm of unspecified part of right bronchus or lung: Secondary | ICD-10-CM

## 2023-10-29 DIAGNOSIS — C3490 Malignant neoplasm of unspecified part of unspecified bronchus or lung: Secondary | ICD-10-CM | POA: Diagnosis not present

## 2023-10-29 DIAGNOSIS — M6281 Muscle weakness (generalized): Secondary | ICD-10-CM | POA: Diagnosis not present

## 2023-10-29 DIAGNOSIS — M75122 Complete rotator cuff tear or rupture of left shoulder, not specified as traumatic: Secondary | ICD-10-CM | POA: Diagnosis not present

## 2023-10-29 DIAGNOSIS — I69952 Hemiplegia and hemiparesis following unspecified cerebrovascular disease affecting left dominant side: Secondary | ICD-10-CM | POA: Diagnosis not present

## 2023-10-29 MED ORDER — ALECENSA 150 MG PO CAPS
450.0000 mg | ORAL_CAPSULE | Freq: Two times a day (BID) | ORAL | 3 refills | Status: DC
  Filled 2023-10-29: qty 180, 30d supply, fill #0

## 2023-10-29 MED ORDER — ALECENSA 150 MG PO CAPS
600.0000 mg | ORAL_CAPSULE | Freq: Two times a day (BID) | ORAL | 3 refills | Status: DC
  Filled 2023-10-29: qty 240, 30d supply, fill #0

## 2023-10-29 NOTE — Progress Notes (Signed)
 Specialty Pharmacy Refill Coordination Note  Arthur [name] is a 83 y.o. male contacted today regarding refills of specialty medication(s) Alectinib HCl (Alecensa )   Patient requested Delivery   Delivery date: 11/03/23   Verified address: [address] [place], [address]   Medication will be filled on 10/31/23.     This fill date is pending response to refill request from provider. Patient is aware and if they have not received fill by intended date they must follow up with pharmacy.

## 2023-10-29 NOTE — Progress Notes (Signed)
 Clinical Intervention Note  Clinical Intervention Notes: Patient reported that dose of Alecensa  was reduced to 3 capsules twice daily due to issues with side effects. Confirmed with Arthur [name] that dose was reduced and requested new prescription for the new dose.   Clinical Intervention Outcomes: Improved therapy effectiveness; Improved therapy adherence   [facility] Specialty Pharmacist

## 2023-10-30 ENCOUNTER — Other Ambulatory Visit: Payer: Self-pay

## 2023-11-03 DIAGNOSIS — I69952 Hemiplegia and hemiparesis following unspecified cerebrovascular disease affecting left dominant side: Secondary | ICD-10-CM | POA: Diagnosis not present

## 2023-11-03 DIAGNOSIS — M75122 Complete rotator cuff tear or rupture of left shoulder, not specified as traumatic: Secondary | ICD-10-CM | POA: Diagnosis not present

## 2023-11-03 DIAGNOSIS — M6281 Muscle weakness (generalized): Secondary | ICD-10-CM | POA: Diagnosis not present

## 2023-11-03 DIAGNOSIS — C3490 Malignant neoplasm of unspecified part of unspecified bronchus or lung: Secondary | ICD-10-CM | POA: Diagnosis not present

## 2023-11-04 ENCOUNTER — Other Ambulatory Visit: Payer: Self-pay | Admitting: Family Medicine

## 2023-11-05 ENCOUNTER — Other Ambulatory Visit: Payer: Self-pay

## 2023-11-05 ENCOUNTER — Emergency Department (HOSPITAL_COMMUNITY)

## 2023-11-05 ENCOUNTER — Inpatient Hospital Stay (HOSPITAL_COMMUNITY)
Admission: EM | Admit: 2023-11-05 | Discharge: 2023-11-29 | DRG: 177 | Disposition: E | Source: Skilled Nursing Facility | Attending: Internal Medicine | Admitting: Internal Medicine

## 2023-11-05 ENCOUNTER — Encounter (HOSPITAL_COMMUNITY): Payer: Self-pay

## 2023-11-05 DIAGNOSIS — Z7952 Long term (current) use of systemic steroids: Secondary | ICD-10-CM

## 2023-11-05 DIAGNOSIS — Z7189 Other specified counseling: Secondary | ICD-10-CM | POA: Diagnosis not present

## 2023-11-05 DIAGNOSIS — R64 Cachexia: Secondary | ICD-10-CM | POA: Diagnosis present

## 2023-11-05 DIAGNOSIS — N200 Calculus of kidney: Secondary | ICD-10-CM | POA: Diagnosis present

## 2023-11-05 DIAGNOSIS — L89512 Pressure ulcer of right ankle, stage 2: Secondary | ICD-10-CM | POA: Diagnosis present

## 2023-11-05 DIAGNOSIS — Z7902 Long term (current) use of antithrombotics/antiplatelets: Secondary | ICD-10-CM | POA: Diagnosis not present

## 2023-11-05 DIAGNOSIS — Z8249 Family history of ischemic heart disease and other diseases of the circulatory system: Secondary | ICD-10-CM

## 2023-11-05 DIAGNOSIS — R627 Adult failure to thrive: Secondary | ICD-10-CM | POA: Diagnosis not present

## 2023-11-05 DIAGNOSIS — Z79899 Other long term (current) drug therapy: Secondary | ICD-10-CM

## 2023-11-05 DIAGNOSIS — I10 Essential (primary) hypertension: Secondary | ICD-10-CM | POA: Diagnosis not present

## 2023-11-05 DIAGNOSIS — R0600 Dyspnea, unspecified: Secondary | ICD-10-CM | POA: Diagnosis not present

## 2023-11-05 DIAGNOSIS — I2510 Atherosclerotic heart disease of native coronary artery without angina pectoris: Secondary | ICD-10-CM | POA: Diagnosis present

## 2023-11-05 DIAGNOSIS — Z681 Body mass index (BMI) 19 or less, adult: Secondary | ICD-10-CM

## 2023-11-05 DIAGNOSIS — E7800 Pure hypercholesterolemia, unspecified: Secondary | ICD-10-CM | POA: Diagnosis not present

## 2023-11-05 DIAGNOSIS — R0602 Shortness of breath: Secondary | ICD-10-CM | POA: Diagnosis not present

## 2023-11-05 DIAGNOSIS — R0609 Other forms of dyspnea: Secondary | ICD-10-CM | POA: Diagnosis not present

## 2023-11-05 DIAGNOSIS — J9809 Other diseases of bronchus, not elsewhere classified: Secondary | ICD-10-CM | POA: Diagnosis present

## 2023-11-05 DIAGNOSIS — C3490 Malignant neoplasm of unspecified part of unspecified bronchus or lung: Secondary | ICD-10-CM | POA: Diagnosis not present

## 2023-11-05 DIAGNOSIS — J9621 Acute and chronic respiratory failure with hypoxia: Secondary | ICD-10-CM | POA: Diagnosis not present

## 2023-11-05 DIAGNOSIS — J690 Pneumonitis due to inhalation of food and vomit: Secondary | ICD-10-CM | POA: Diagnosis not present

## 2023-11-05 DIAGNOSIS — J984 Other disorders of lung: Secondary | ICD-10-CM | POA: Diagnosis not present

## 2023-11-05 DIAGNOSIS — R54 Age-related physical debility: Secondary | ICD-10-CM | POA: Diagnosis present

## 2023-11-05 DIAGNOSIS — I3139 Other pericardial effusion (noninflammatory): Secondary | ICD-10-CM | POA: Diagnosis present

## 2023-11-05 DIAGNOSIS — R1311 Dysphagia, oral phase: Secondary | ICD-10-CM | POA: Diagnosis not present

## 2023-11-05 DIAGNOSIS — G9340 Encephalopathy, unspecified: Secondary | ICD-10-CM | POA: Diagnosis not present

## 2023-11-05 DIAGNOSIS — Z888 Allergy status to other drugs, medicaments and biological substances status: Secondary | ICD-10-CM

## 2023-11-05 DIAGNOSIS — K861 Other chronic pancreatitis: Secondary | ICD-10-CM | POA: Diagnosis present

## 2023-11-05 DIAGNOSIS — K8689 Other specified diseases of pancreas: Secondary | ICD-10-CM | POA: Diagnosis not present

## 2023-11-05 DIAGNOSIS — Z7901 Long term (current) use of anticoagulants: Secondary | ICD-10-CM

## 2023-11-05 DIAGNOSIS — C799 Secondary malignant neoplasm of unspecified site: Secondary | ICD-10-CM | POA: Diagnosis present

## 2023-11-05 DIAGNOSIS — Z86718 Personal history of other venous thrombosis and embolism: Secondary | ICD-10-CM

## 2023-11-05 DIAGNOSIS — I499 Cardiac arrhythmia, unspecified: Secondary | ICD-10-CM | POA: Diagnosis not present

## 2023-11-05 DIAGNOSIS — J9601 Acute respiratory failure with hypoxia: Secondary | ICD-10-CM

## 2023-11-05 DIAGNOSIS — R1310 Dysphagia, unspecified: Secondary | ICD-10-CM | POA: Diagnosis present

## 2023-11-05 DIAGNOSIS — I480 Paroxysmal atrial fibrillation: Secondary | ICD-10-CM | POA: Diagnosis present

## 2023-11-05 DIAGNOSIS — E119 Type 2 diabetes mellitus without complications: Secondary | ICD-10-CM | POA: Diagnosis present

## 2023-11-05 DIAGNOSIS — J188 Other pneumonia, unspecified organism: Principal | ICD-10-CM

## 2023-11-05 DIAGNOSIS — Z91041 Radiographic dye allergy status: Secondary | ICD-10-CM

## 2023-11-05 DIAGNOSIS — Z955 Presence of coronary angioplasty implant and graft: Secondary | ICD-10-CM

## 2023-11-05 DIAGNOSIS — Z515 Encounter for palliative care: Secondary | ICD-10-CM

## 2023-11-05 DIAGNOSIS — I517 Cardiomegaly: Secondary | ICD-10-CM | POA: Diagnosis not present

## 2023-11-05 DIAGNOSIS — C3491 Malignant neoplasm of unspecified part of right bronchus or lung: Secondary | ICD-10-CM | POA: Diagnosis present

## 2023-11-05 DIAGNOSIS — R52 Pain, unspecified: Secondary | ICD-10-CM

## 2023-11-05 DIAGNOSIS — Z66 Do not resuscitate: Secondary | ICD-10-CM | POA: Diagnosis not present

## 2023-11-05 DIAGNOSIS — I69398 Other sequelae of cerebral infarction: Secondary | ICD-10-CM | POA: Diagnosis not present

## 2023-11-05 DIAGNOSIS — J90 Pleural effusion, not elsewhere classified: Secondary | ICD-10-CM | POA: Diagnosis present

## 2023-11-05 DIAGNOSIS — R918 Other nonspecific abnormal finding of lung field: Secondary | ICD-10-CM | POA: Diagnosis not present

## 2023-11-05 DIAGNOSIS — I2583 Coronary atherosclerosis due to lipid rich plaque: Secondary | ICD-10-CM | POA: Diagnosis present

## 2023-11-05 DIAGNOSIS — D589 Hereditary hemolytic anemia, unspecified: Secondary | ICD-10-CM | POA: Diagnosis present

## 2023-11-05 DIAGNOSIS — J9811 Atelectasis: Secondary | ICD-10-CM | POA: Diagnosis not present

## 2023-11-05 LAB — CBC WITH DIFFERENTIAL/PLATELET
Abs Immature Granulocytes: 0.03 K/uL (ref 0.00–0.07)
Basophils Absolute: 0 K/uL (ref 0.0–0.1)
Basophils Relative: 0 %
Eosinophils Absolute: 0 K/uL (ref 0.0–0.5)
Eosinophils Relative: 0 %
HCT: 28 % — ABNORMAL LOW (ref 39.0–52.0)
Hemoglobin: 8.8 g/dL — ABNORMAL LOW (ref 13.0–17.0)
Immature Granulocytes: 0 %
Lymphocytes Relative: 8 %
Lymphs Abs: 0.6 K/uL — ABNORMAL LOW (ref 0.7–4.0)
MCH: 28.3 pg (ref 26.0–34.0)
MCHC: 31.4 g/dL (ref 30.0–36.0)
MCV: 90 fL (ref 80.0–100.0)
Monocytes Absolute: 0.4 K/uL (ref 0.1–1.0)
Monocytes Relative: 6 %
Neutro Abs: 6 K/uL (ref 1.7–7.7)
Neutrophils Relative %: 86 %
Platelets: 362 K/uL (ref 150–400)
RBC: 3.11 MIL/uL — ABNORMAL LOW (ref 4.22–5.81)
RDW: 18.2 % — ABNORMAL HIGH (ref 11.5–15.5)
WBC: 7 K/uL (ref 4.0–10.5)
nRBC: 0 % (ref 0.0–0.2)

## 2023-11-05 LAB — COMPREHENSIVE METABOLIC PANEL WITH GFR
ALT: 13 U/L (ref 0–44)
AST: 20 U/L (ref 15–41)
Albumin: 2.8 g/dL — ABNORMAL LOW (ref 3.5–5.0)
Alkaline Phosphatase: 75 U/L (ref 38–126)
Anion gap: 7 (ref 5–15)
BUN: 16 mg/dL (ref 8–23)
CO2: 33 mmol/L — ABNORMAL HIGH (ref 22–32)
Calcium: 8.7 mg/dL — ABNORMAL LOW (ref 8.9–10.3)
Chloride: 103 mmol/L (ref 98–111)
Creatinine, Ser: 1.01 mg/dL (ref 0.61–1.24)
GFR, Estimated: >60 mL/min (ref >60)
Glucose, Bld: 153 mg/dL — ABNORMAL HIGH (ref 70–99)
Potassium: 4.7 mmol/L (ref 3.5–5.1)
Sodium: 143 mmol/L (ref 135–145)
Total Bilirubin: 1.3 mg/dL — ABNORMAL HIGH (ref 0.0–1.2)
Total Protein: 5.3 g/dL — ABNORMAL LOW (ref 6.5–8.1)

## 2023-11-05 LAB — I-STAT CG4 LACTIC ACID, ED
Lactic Acid, Venous: 0.9 mmol/L (ref 0.5–1.9)
Lactic Acid, Venous: 0.7 mmol/L (ref 0.5–1.9)

## 2023-11-05 LAB — PRO BRAIN NATRIURETIC PEPTIDE: Pro Brain Natriuretic Peptide: 3136 pg/mL — ABNORMAL HIGH (ref <300.0)

## 2023-11-05 LAB — PROTIME-INR
INR: 1.1 (ref 0.8–1.2)
Prothrombin Time: 14.4 s (ref 11.4–15.2)

## 2023-11-05 LAB — TROPONIN T, HIGH SENSITIVITY
Troponin T High Sensitivity: 56 ng/L — ABNORMAL HIGH (ref 0–19)
Troponin T High Sensitivity: 53 ng/L — ABNORMAL HIGH (ref 0–19)

## 2023-11-05 MED ORDER — SODIUM CHLORIDE 0.9 % IV SOLN
2.0000 g | Freq: Once | INTRAVENOUS | Status: AC
  Administered 2023-11-05: 2 g via INTRAVENOUS
  Filled 2023-11-05: qty 12.5

## 2023-11-05 MED ORDER — VANCOMYCIN HCL IN DEXTROSE 1-5 GM/200ML-% IV SOLN
1000.0000 mg | Freq: Once | INTRAVENOUS | Status: AC
  Administered 2023-11-05: 1000 mg via INTRAVENOUS
  Filled 2023-11-05: qty 200

## 2023-11-05 NOTE — Progress Notes (Signed)
 Patient transported from [facility] to CT scan and back to room WA19.  Patient remained stable and comfortable throughout the trip on [place].

## 2023-11-05 NOTE — Progress Notes (Signed)
 ED Pharmacy Antibiotic Sign Off An antibiotic consult was received from an ED provider for Cefepime  + Vancomycin  per pharmacy dosing for PNA. A chart review was completed to assess appropriateness.   The following one time order(s) were placed:  Cefepime  2gm IV Vancomycin  1gm IV  Further antibiotic and/or antibiotic pharmacy consults should be ordered by the admitting provider if indicated.   Thank you for allowing pharmacy to be a part of this patient's care.   Rosaline Millet, [facility]  Clinical Pharmacist 11/05/23 5:55 PM

## 2023-11-05 NOTE — H&P (Signed)
 History and Physical    Arthur [name] FMW:[id number] DOB: January 08, 1941 DOA: 11/05/2023  PCP: Stoney Blizzard, DO  Patient coming from: [facility]  I have personally briefly reviewed patient's old medical records in [facility]  Chief Complaint: Shortness of breath  HPI: Arthur [name] is a 83 y.o. 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  Patient was recently admitted 9/7-9/11 for left lower lobe pneumonia.  Patient was treated with IV ceftriaxone  and azithromycin  while in hospital.  Patient was noted to have severe oropharyngeal dysphagia on swallow study.  He was also noted to have waxing and waning mentation.  EEG noted moderate diffuse encephalopathy.  Patient was discharged to SNF.  Patient reports developing shortness of breath about 4 days ago.  He has had associated cough productive of thick white sputum.  He says he normally ambulates with use of a walker but has felt so sick the last few days he has not been walking much at all.  ED Course  Labs/Imaging on admission: I have personally reviewed following labs and imaging studies.  Initial vitals showed BP 135/59, pulse 106, RR 29, temp 98.3 F, SpO2 100% on 4 L O2 via [place].  SpO2 dropped to 78% while on 6 L.  Patient was transitioned to NRB, then BiPAP with improvement in SpO2.  Labs showed WBC 7.0, hemoglobin 8.8, platelets 362, sodium 143, potassium 4.7, bicarb 33, BUN 16, creatinine 1.01, serum glucose 153, proBNP 3136, troponin T 56 > 53, lactic acid 0.7.  Blood cultures in process.  UA pending collection.  2 view chest x-ray showed new finding of volume loss and near complete opacification of the left chest likely  representing pleural effusion with collapse and consolidation in the left lung.  CT chest without contrast IMPRESSION: 1. Moderate bilateral pleural effusions. 2. Bilateral basilar atelectasis or consolidation, greater on the left. 3. Debris in the trachea with complete filling of the left lower lobe bronchus. 4. Patchy airspace infiltrates in the right lower lung and throughout the aerated left lung with left central consolidation. 5. Altogether, changes likely represent multifocal pneumonia with compressive atelectasis and consolidation in the lung bases. Aspiration could also have this appearance. A left endobronchial obstructing lesion is not excluded and follow-up after resolution of acute process is recommended. 6. Underlying emphysematous changes in the lungs. 7. Cardiac enlargement with small pericardial effusion. 8. Nonobstructing stone in the right kidney. 9. Pancreatic calcification consistent with chronic pancreatitis.  Patient was given IV vancomycin  and cefepime .  EDP discussed with PCCM (Dr. Layman), their team will see in consultation in the morning and are available overnight if needed.  The hospitalist service was consulted for admission.  Review of Systems: All systems reviewed and are negative except as documented in history of present illness above.   Past Medical History:  Diagnosis Date   Diabetes mellitus without complication (HCC)    High cholesterol    Hypertension    lung ca    Normal nuclear stress test 01/29/2008   stress perfusion study apparently in 2010 in [facility] which he said was negative.    Past Surgical History:  Procedure Laterality Date   BIOPSY  10/17/2022   Procedure:  BIOPSY;  Surgeon: Stacia Glendia BRAVO, MD;  Location: [facility] ENDOSCOPY;  Service: Gastroenterology;;   CATARACT EXTRACTION, BILATERAL  2006   CORONARY STENT INTERVENTION N/A 02/06/2021   Procedure: CORONARY STENT INTERVENTION;  Surgeon: Dann Candyce RAMAN, MD;   Location: [facility] INVASIVE CV LAB;  Service: Cardiovascular;  Laterality: N/A;   CORONARY ULTRASOUND/IVUS N/A 02/06/2021   Procedure: Intravascular Ultrasound/IVUS;  Surgeon: Dann Candyce RAMAN, MD;  Location: [facility] INVASIVE CV LAB;  Service: Cardiovascular;  Laterality: N/A;   ESOPHAGOGASTRODUODENOSCOPY N/A 10/17/2022   Procedure: ESOPHAGOGASTRODUODENOSCOPY (EGD);  Surgeon: Stacia Glendia BRAVO, MD;  Location: [facility] ENDOSCOPY;  Service: Gastroenterology;  Laterality: N/A;   FINE NEEDLE ASPIRATION  03/21/2021   Procedure: FINE NEEDLE ASPIRATION;  Surgeon: Brenna Adine CROME, DO;  Location: [facility] ENDOSCOPY;  Service: Pulmonary;;   IR RADIOLOGIST EVAL & MGMT  12/24/2021   KNEE SURGERY  2005   LEFT HEART CATH AND CORONARY ANGIOGRAPHY N/A 02/06/2021   Procedure: LEFT HEART CATH AND CORONARY ANGIOGRAPHY;  Surgeon: Dann Candyce RAMAN, MD;  Location: [facility] INVASIVE CV LAB;  Service: Cardiovascular;  Laterality: N/A;   VIDEO BRONCHOSCOPY WITH ENDOBRONCHIAL ULTRASOUND Bilateral 03/21/2021   Procedure: VIDEO BRONCHOSCOPY WITH ENDOBRONCHIAL ULTRASOUND;  Surgeon: Brenna Adine CROME, DO;  Location: [facility] ENDOSCOPY;  Service: Pulmonary;  Laterality: Bilateral;    Social History: Social History   Tobacco Use   Smoking status: Never   Smokeless tobacco: Never  Substance Use Topics   Alcohol use: No   Drug use: No   Allergies  Allergen Reactions   Iohexol  Other (See Comments)    Severe ATN after CT with contrast May 2023 - creatinine peaked near 8. Did not require HD.   Ace Inhibitors Cough    Family History  Problem Relation Age of Onset   Coronary artery disease Father    Coronary artery disease Mother      Prior to Admission medications   Medication Sig Start Date End Date Taking? Authorizing Provider  acetaminophen  (TYLENOL ) 500 MG tablet Take 1,000 mg by mouth every 8 (eight) hours as needed for mild pain (pain score 1-3).    [provider]  alectinib (ALECENSA ) 150 MG capsule Take 3 capsules (450 mg  total) by mouth 2 (two) times daily with a meal. 10/29/23   Sherrod Sherrod, MD  B Complex Vitamins (VITAMIN B COMPLEX) TABS Take 1 tablet by mouth every morning.    [provider]  baclofen  (LIORESAL ) 10 MG tablet Take 1 tablet (10 mg total) by mouth 2 (two) times daily as needed for muscle spasms. 01/31/23   Jeanelle Layman CROME, MD  busPIRone  (BUSPAR ) 5 MG tablet Take 1 tablet (5 mg total) by mouth 2 (two) times daily. Patient taking differently: Take 5 mg by mouth 2 (two) times daily. 09/09/23   Rosendo Rush, MD  clopidogrel  (PLAVIX ) 75 MG tablet Take 1 tablet (75 mg total) by mouth daily with breakfast. 09/09/23   Rosendo Rush, MD  diclofenac  Sodium (VOLTAREN ) 1 % GEL Apply 2 g topically as needed (Left shoulder pan). 06/15/20   [provider]  enoxaparin  (LOVENOX ) 60 MG/0.6ML injection Inject 0.7 mLs into the skin every 12 (twelve) hours. 09/28/23   [provider]  Iron -FA-B Cmp-C-Biot-Probiotic (FUSION PLUS) CAPS Take 1 capsule by mouth once daily 11/04/23   [name], [name], DO  lidocaine  4 % Place 1 patch onto the skin daily as needed (Left arm pain).    [provider]  Menthol -Methyl Salicylate  (MUSCLE RUB) 10-15 % CREA Apply 1 Application topically as  needed for muscle pain. 10/08/23   Gomes, Adriana, DO  oxyCODONE  (OXY IR/ROXICODONE ) 5 MG immediate release tablet Take 0.5-1 tablets (2.5-5 mg total) by mouth every 6 (six) hours as needed for moderate pain (pain score 4-6). 10/10/23   Tharon Lung, MD  pantoprazole  (PROTONIX ) 40 MG tablet Take 1 tablet (40 mg total) by mouth 2 (two) times daily. 09/23/23   [name], [name], DO  polyethylene glycol (MIRALAX ) 17 g packet Take 17 g by mouth daily. 10/09/23   Gomes, Adriana, DO  predniSONE  (DELTASONE ) 10 MG tablet Take 1 tablet (10 mg total) by mouth daily with breakfast. 08/19/23   Heilingoetter, Cassandra L, PA-C  rosuvastatin  (CRESTOR ) 5 MG tablet Take 1 tablet by mouth once daily Patient taking differently:  Take 5 mg by mouth at bedtime. 05/19/23   Gomes, Adriana, DO  senna (SENOKOT) 8.6 MG TABS tablet Take 1 tablet (8.6 mg total) by mouth daily. 10/09/23   Gomes, Adriana, DO  sodium chloride  (OCEAN) 0.65 % SOLN nasal spray Place 1 spray into both nostrils as needed for congestion. 09/06/22   Howell Lunger, DO    Physical Exam: Vitals:   11/05/23 2009 11/05/23 2207 11/05/23 2230 11/05/23 2336  BP:   (!) 118/54   Pulse: (!) 113 99 (!) 110   Resp: (!) 32 (!) 27 (!) 30   Temp:    98.2 F (36.8 C)  TempSrc:    Oral  SpO2: 100% 100% 100%   Height:       Constitutional: Elderly man resting in bed, appears fatigued Eyes: EOMI, lids and conjunctivae normal ENMT: Voice is soft.  Mucous membranes are moist. Posterior pharynx clear of any exudate or lesions.Normal dentition.  Neck: normal, supple, no masses. Respiratory: Diminished breath sounds bilateral lung bases. Normal respiratory effort while on 15 L HFNC. No accessory muscle use.  Cardiovascular: Irregularly irregular, no murmurs / rubs / gallops. No extremity edema. 2+ pedal pulses. Abdomen: no tenderness, no masses palpated. Musculoskeletal: no clubbing / cyanosis. No joint deformity upper and lower extremities. Good ROM, no contractures. Normal muscle tone.  Skin: no rashes, lesions, ulcers. No induration Neurologic: Sensation intact. Strength 5/5 in all 4.  Psychiatric: Normal judgment and insight. Alert and oriented x 3. Normal mood.   EKG: Personally reviewed. Atrial fibrillation, rate 102, low voltage, QTc 515.  QTc is more prolonged when compared to previous.  Assessment/Plan Principal Problem:   Acute on chronic respiratory failure with hypoxia (HCC) Active Problems:   Multifocal pneumonia   Bilateral pleural effusion   Coronary artery disease due to lipid rich plaque   Non-small cell carcinoma of lung, stage 4 (HCC)   Paroxysmal atrial fibrillation (HCC)   Arthur [name] is a 83 y.o. 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  Assessment and Plan: Acute hypoxic respiratory failure due to multifocal pneumonia Bilateral pleural effusions: Recent admission for LLL PNA now admitted with hypoxic respiratory failure due to multifocal pneumonia with bilateral pleural effusions and bibasilar atelectasis.  Aspiration risk is high and potentially contributing.  Left endobronchial obstructing lesion not excluded. - Admit to stepdown unit - Continue supplemental O2 via HFNC and wean as able - BiPAP available as needed - Continue IV vancomycin  and cefepime  -  Follow blood cultures - Check strep pneumonia and Legionella urine antigens - Aspiration precautions  Paroxysmal atrial fibrillation: Remains in atrial fibrillation, HR mostly <110.  Not on rate or rhythm controlling agents as an outpatient.  Continue Lovenox  anticoagulation.  History of recurrent DVT: Continue Lovenox .  Chronic hemolytic anemia: Hemoglobin stable at 8.8.  Continue home prednisone  10 mg daily.  History of CVA: Continue Lovenox , Plavix , statin.  CAD s/p DES to LAD 2023: Continue Plavix  and rosuvastatin .  Hypertension: Does not appear to be on antihypertensives as an outpatient.  BP is currently stable.  Continue to monitor.  Stage IV right lung adenocarcinoma: Follows with oncology, Dr. Sherrod.  Current therapy is with alectinib, will hold for now.   DVT prophylaxis: Lovenox  Code Status: Full code, discussed with patient on admission Family Communication: Discussed with patient, he is discussed with family Disposition Plan: From SNF, dispo pending clinical progress Consults called: PCCM Severity of Illness: The appropriate patient status  for this patient is INPATIENT. Inpatient status is judged to be reasonable and necessary in order to provide the required intensity of service to ensure the patient's safety. The patient's presenting symptoms, physical exam findings, and initial radiographic and laboratory data in the context of their chronic comorbidities is felt to place them at high risk for further clinical deterioration. Furthermore, it is not anticipated that the patient will be medically stable for discharge from the hospital within 2 midnights of admission.   * I certify that at the point of admission it is my clinical judgment that the patient will require inpatient hospital care spanning beyond 2 midnights from the point of admission due to high intensity of service, high risk for further deterioration and high frequency of surveillance required.DEWAINE Jorie Blanch MD [facility]  If 7PM-7AM, please contact night-coverage [email or web address]  11/06/2023, 12:40 AM

## 2023-11-05 NOTE — ED Provider Notes (Addendum)
 [place] EMERGENCY DEPARTMENT AT [facility] Provider Note   CSN: [id number] Arrival date & time: 11/05/23  1616     Patient presents with: Shortness of Breath   Arthur [name] is a 83 y.o. male.   HPI    83yo male with a history of atrial fibrillation, CVA with residual left-sided deficits, primary adenocarcinoma of the lung, discharged from the hospital September 7 who presents with concern for shortness of breath.  Reports cough for 4 days, some white sputum, difficult to raise Shortness of breath this morning Feels cold, no known fevers No chest pain No cough or congestion No leg swelling Is on anticoagulation for afib    Past Medical History:  Diagnosis Date   Diabetes mellitus without complication (HCC)    High cholesterol    Hypertension    lung ca    Normal nuclear stress test 01/29/2008   stress perfusion study apparently in 2010 in [facility] which he said was negative.     Prior to Admission medications   Medication Sig Start Date End Date Taking? Authorizing Provider  acetaminophen  (TYLENOL ) 500 MG tablet Take 1,000 mg by mouth every 8 (eight) hours as needed for mild pain (pain score 1-3).   Yes [provider]  alectinib (ALECENSA ) 150 MG capsule Take 3 capsules (450 mg total) by mouth 2 (two) times daily with a meal. 10/29/23  Yes Sherrod Sherrod, MD  B Complex Vitamins (VITAMIN B COMPLEX) TABS Take 1 tablet by mouth daily.   Yes [provider]  baclofen  (LIORESAL ) 10 MG tablet Take 1 tablet (10 mg total) by mouth 2 (two) times daily as needed for muscle spasms. 01/31/23  Yes Chambliss, Layman CROME, MD  benzonatate  (TESSALON ) 200 MG capsule Take 200 mg by mouth in the morning, at noon, and at bedtime.   Yes [provider]  clopidogrel  (PLAVIX ) 75 MG tablet Take 1 tablet (75 mg total) by mouth daily with breakfast. 09/09/23  Yes Rosendo Rush, MD  diclofenac  Sodium (VOLTAREN ) 1 % GEL Apply 2 g topically as  needed (Left shoulder pan). 06/15/20  Yes [provider]  enoxaparin  (LOVENOX ) 80 MG/0.8ML injection Inject 0.7 mLs into the skin every 12 (twelve) hours.   Yes [provider]  lidocaine  4 % Place 1 patch onto the skin daily as needed (Left arm pain).   Yes [provider]  LORazepam (ATIVAN) 0.5 MG tablet Take 0.25 mg by mouth 2 (two) times daily. 11/05/23  Yes [provider]  oxyCODONE  (OXY IR/ROXICODONE ) 5 MG immediate release tablet Take 0.5-1 tablets (2.5-5 mg total) by mouth every 6 (six) hours as needed for moderate pain (pain score 4-6). Patient taking differently: Take 5 mg by mouth every 6 (six) hours as needed for moderate pain (pain score 4-6). 10/10/23  Yes Mabe, Elna, MD  pantoprazole  (PROTONIX ) 40 MG tablet Take 1 tablet (40 mg total) by mouth 2 (two) times daily. 09/23/23  Yes [name], [name], DO  polyethylene glycol (MIRALAX ) 17 g packet Take 17 g by mouth daily. 10/09/23  Yes Gomes, Adriana, DO  predniSONE  (DELTASONE ) 10 MG tablet Take 1 tablet (10 mg total) by mouth daily with breakfast. 08/19/23  Yes Heilingoetter, Cassandra L, PA-C  rosuvastatin  (CRESTOR ) 5 MG tablet Take 1 tablet by mouth once daily Patient taking differently: Take 5 mg by mouth at bedtime. 05/19/23  Yes Gomes, Adriana, DO  senna (SENOKOT) 8.6 MG TABS tablet Take 1 tablet (8.6 mg total) by mouth daily. 10/09/23  Yes Gomes, Adriana, DO  sodium chloride  (OCEAN) 0.65 % SOLN nasal spray Place 1 spray into both nostrils as needed for congestion. 09/06/22  Yes Howell Lunger, DO  busPIRone  (BUSPAR ) 5 MG tablet Take 1 tablet (5 mg total) by mouth 2 (two) times daily. Patient not taking: Reported on 11/05/2023 09/09/23   Rosendo Rush, MD  Iron -FA-B Cmp-C-Biot-Probiotic (FUSION PLUS) CAPS Take 1 capsule by mouth once daily 11/04/23   [name], [name], DO  Menthol -Methyl Salicylate  (MUSCLE RUB) 10-15 % CREA Apply 1 Application topically as needed for muscle pain. Patient not taking:  Reported on 11/05/2023 10/08/23   Janna Ferrier, DO    Allergies: Iohexol  and Ace inhibitors    Review of Systems  Updated Vital Signs BP (!) 119/41   Pulse (!) 115   Temp 98.3 F (36.8 C) (Axillary)   Resp (!) 24   Ht 5' 11 (1.803 m)   Wt 59.5 kg   SpO2 100%   BMI 18.30 kg/m   Physical Exam Vitals and nursing note reviewed.  Constitutional:      General: He is not in acute distress.    Appearance: He is well-developed. He is ill-appearing. He is not diaphoretic.  HENT:     Head: Normocephalic and atraumatic.  Eyes:     Conjunctiva/sclera: Conjunctivae normal.  Cardiovascular:     Rate and Rhythm: Regular rhythm. Tachycardia present.     Heart sounds: Normal heart sounds. No murmur heard.    No friction rub. No gallop.  Pulmonary:     Effort: Pulmonary effort is normal. Tachypnea present. No respiratory distress.     Breath sounds: Examination of the left-upper field reveals decreased breath sounds. Examination of the left-middle field reveals decreased breath sounds. Examination of the left-lower field reveals decreased breath sounds. Decreased breath sounds present. No wheezing or rales.  Abdominal:     General: There is no distension.     Palpations: Abdomen is soft.     Tenderness: There is no abdominal tenderness. There is no guarding.  Musculoskeletal:     Cervical back: Normal range of motion.  Skin:    General: Skin is warm and dry.  Neurological:     Mental Status: He is alert and oriented to person, place, and time.     (all labs ordered are listed, but only abnormal results are displayed) Labs Reviewed  CBC WITH DIFFERENTIAL/PLATELET - Abnormal; Notable for the following components:      Result Value   RBC 3.11 (*)    Hemoglobin 8.8 (*)    HCT 28.0 (*)    RDW 18.2 (*)    Lymphs Abs 0.6 (*)    All other components within normal limits  URINALYSIS, W/ REFLEX TO CULTURE (INFECTION SUSPECTED) - Abnormal; Notable for the following components:    Ketones, ur 5 (*)    Bacteria, UA RARE (*)    All other components within normal limits  COMPREHENSIVE METABOLIC PANEL WITH GFR - Abnormal; Notable for the following components:   CO2 33 (*)    Glucose, Bld 153 (*)    Calcium  8.7 (*)    Total Protein 5.3 (*)    Albumin  2.8 (*)    Total Bilirubin 1.3 (*)    All other components within normal limits  PRO BRAIN NATRIURETIC PEPTIDE - Abnormal; Notable for the following components:   Pro Brain Natriuretic Peptide 3,136.0 (*)    All other components within normal limits  BASIC METABOLIC PANEL WITH GFR - Abnormal; Notable for the  following components:   Glucose, Bld 131 (*)    Calcium  8.5 (*)    All other components within normal limits  CBC - Abnormal; Notable for the following components:   RBC 3.00 (*)    Hemoglobin 8.2 (*)    HCT 27.2 (*)    RDW 17.8 (*)    All other components within normal limits  GLUCOSE, CAPILLARY - Abnormal; Notable for the following components:   Glucose-Capillary 103 (*)    All other components within normal limits  TROPONIN T, HIGH SENSITIVITY - Abnormal; Notable for the following components:   Troponin T High Sensitivity 56 (*)    All other components within normal limits  TROPONIN T, HIGH SENSITIVITY - Abnormal; Notable for the following components:   Troponin T High Sensitivity 53 (*)    All other components within normal limits  CULTURE, BLOOD (ROUTINE X 2)  CULTURE, BLOOD (ROUTINE X 2)  MRSA NEXT GEN BY PCR, NASAL  PROTIME-INR  PATHOLOGIST SMEAR REVIEW  STREP PNEUMONIAE URINARY ANTIGEN  LEGIONELLA PNEUMOPHILA SEROGP 1 UR AG  I-STAT CG4 LACTIC ACID, ED  I-STAT CG4 LACTIC ACID, ED    EKG: EKG Interpretation Date/Time:  Wednesday November 05 2023 16:31:24 EDT Ventricular Rate:  102 PR Interval:    QRS Duration:  85 QT Interval:  407 QTC Calculation: 515 R Axis:   -7  Text Interpretation: Atrial fibrillation Paired ventricular premature complexes Low voltage, precordial leads Borderline T  wave abnormalities Prolonged QT interval No significant change since last tracing Confirmed by Dreama Longs ([id number]) on 11/05/2023 5:45:30 PM  Radiology: CT Chest Wo Contrast Result Date: 11/05/2023 CLINICAL DATA:  Shortness of breath. Abnormal chest radiograph with new opacification in volume loss on the left. Shortness of breath for 3 days. EXAM: CT CHEST WITHOUT CONTRAST TECHNIQUE: Multidetector CT imaging of the chest was performed following the standard protocol without IV contrast. RADIATION DOSE REDUCTION: This exam was performed according to the departmental dose-optimization program which includes automated exposure control, adjustment of the mA and/or kV according to patient size and/or use of iterative reconstruction technique. COMPARISON:  Chest radiograph 11/05/2023.  CT chest 10/05/2023 FINDINGS: Cardiovascular: Cardiac enlargement. Small pericardial effusion. Normal caliber thoracic aorta. Calcification in the aorta and coronary arteries. Probable coronary artery stent. Mediastinum/Nodes: Esophagus is decompressed. No significant lymphadenopathy. Thyroid  gland is unremarkable. Lungs/Pleura: Moderate bilateral pleural effusions. Bilateral basilar atelectasis or consolidation, greater on the left. Air bronchograms are present. Debris demonstrated in the trachea with filling of the lower lobe bronchus. This could be due to aspirated contents, mucous plugging, or an endobronchial obstructing lesion. Follow-up after resolution of acute process is recommended. Patchy airspace infiltrates demonstrated in the right lung base and throughout the aerated left lung. This is likely multifocal pneumonia or aspiration. Diffuse emphysematous changes in the lungs. No pneumothorax. Upper Abdomen: Calcification throughout the pancreas consistent with chronic pancreatitis. An inferior vena caval filter is present. 3 mm stone in the right kidney. No hydronephrosis. Musculoskeletal: Degenerative changes in the spine.  No acute bony abnormalities. Old rib fractures. IMPRESSION: 1. Moderate bilateral pleural effusions. 2. Bilateral basilar atelectasis or consolidation, greater on the left. 3. Debris in the trachea with complete filling of the left lower lobe bronchus. 4. Patchy airspace infiltrates in the right lower lung and throughout the aerated left lung with left central consolidation. 5. Altogether, changes likely represent multifocal pneumonia with compressive atelectasis and consolidation in the lung bases. Aspiration could also have this appearance. A left endobronchial obstructing lesion is not  excluded and follow-up after resolution of acute process is recommended. 6. Underlying emphysematous changes in the lungs. 7. Cardiac enlargement with small pericardial effusion. 8. Nonobstructing stone in the right kidney. 9. Pancreatic calcification consistent with chronic pancreatitis. Electronically Signed   By: Elsie Gravely M.D.   On: 11/05/2023 20:35   DG Chest 2 View Result Date: 11/05/2023 CLINICAL DATA:  Shortness of breath for 3 days. EXAM: CHEST - 2 VIEW COMPARISON:  10/05/2023 FINDINGS: There is near complete opacification of the left lung with some residual aeration in the apex. Diffuse volume loss on the left. This is all new since previous study. This likely represents pleural effusion and collapse/consolidation of the left lung. Follow-up to resolution is recommended to exclude a centrally obstructing lesion. Compensatory hyperinflation of the right lung. No pneumothorax. Heart size is obscured but appears mildly enlarged. IMPRESSION: New finding of volume loss and near complete opacification of the left chest. This likely represents pleural effusion with collapse and consolidation in the left lung. Follow-up to resolution is recommended. Electronically Signed   By: Elsie Gravely M.D.   On: 11/05/2023 17:24     .Critical Care  Performed by: Dreama Longs, MD Authorized by: Dreama Longs, MD    Critical care provider statement:    Critical care time (minutes):  30   Critical care was time spent personally by me on the following activities:  Development of treatment plan with patient or surrogate, discussions with consultants, evaluation of patient's response to treatment, examination of patient, ordering and review of laboratory studies, ordering and review of radiographic studies, ordering and performing treatments and interventions, pulse oximetry, re-evaluation of patient's condition and review of old charts    Medications Ordered in the ED  sodium chloride  flush (NS) 0.9 % injection 3 mL (3 mLs Intravenous Given 11/06/23 0057)  acetaminophen  (TYLENOL ) tablet 650 mg (has no administration in time range)    Or  acetaminophen  (TYLENOL ) suppository 650 mg (has no administration in time range)  ondansetron  (ZOFRAN ) tablet 4 mg (has no administration in time range)    Or  ondansetron  (ZOFRAN ) injection 4 mg (has no administration in time range)  ceFEPIme  (MAXIPIME ) 2 g in sodium chloride  0.9 % 100 mL IVPB (0 g Intravenous Stopped 11/06/23 0649)  vancomycin  (VANCOCIN ) IVPB 1000 mg/200 mL premix (has no administration in time range)  Chlorhexidine  Gluconate Cloth 2 % PADS 6 each (6 each Topical Given 11/06/23 0115)  Oral care mouth rinse (15 mLs Mouth Rinse Given 11/06/23 9177)  Oral care mouth rinse (has no administration in time range)  enoxaparin  (LOVENOX ) injection 60 mg (has no administration in time range)  furosemide  (LASIX ) injection 40 mg (40 mg Intravenous Given 11/06/23 0822)  methylPREDNISolone  sodium succinate  (SOLU-MEDROL ) 40 mg/mL injection 10 mg (has no administration in time range)  bisacodyl (DULCOLAX) suppository 10 mg (has no administration in time range)  pantoprazole  (PROTONIX ) injection 40 mg (has no administration in time range)  ceFEPIme  (MAXIPIME ) 2 g in sodium chloride  0.9 % 100 mL IVPB (0 g Intravenous Stopped 11/05/23 1844)  vancomycin  (VANCOCIN ) IVPB 1000  mg/200 mL premix (0 mg Intravenous Stopped 11/05/23 1944)  enoxaparin  (LOVENOX ) injection 60 mg (60 mg Subcutaneous Given 11/06/23 0151)                                     83yo male with a history of atrial fibrillation, CVA with residual left-sided deficits, primary  adenocarcinoma of the lung, discharged from the hospital September 7 who presents with concern for shortness of breath.  Differential diagnosis for dyspnea includes ACS, PE, COPD exacerbation, CHF exacerbation, pneumothorax, anemia, pneumonia, viral etiology such as COVID 19 infection, metabolic abnormality.    Chest x-ray was completed and personally evaluated by me and radiology shows a new finding of volume loss in urine pattern complete opacification of the left chest, consider pleural effusion effusion, collapse and consolidation in the left lung.    He was initially on 4 L of oxygen, however following x-ray he had a desaturation to the 70s, continued dyspnea while on nonrebreather, and was placed on BiPAP.  Ordered vancomycin  and cefepime  to cover for HCAP in the setting of recent hospitalization, including postobstructive pneumonia and sepsis.  Labs completed and personally about interpreted by me show elevated proBNP, troponin. Consider component of CHF however will obtain CT chest given concern for post-obstructive pneumonia/atelectasis and or effusion as well.  History does not seem consistent with ACS but will continue to monitor trend of troponin.  Labs completed personally by and interpreted by me show no leukocytosis, stable anemia, no clinically significant electrolyte abnormalities.  CT chest completed and evaluated by me and radiology shows moderate bilateral pleural effusions, bilateral basilar atelectasis or consolidation greater on the left.  He has debris in the trachea with complete filling of the left lower lobe bronchus.  Changes likely represent multifocal pneumonia with complex of atelectasis and  consolidation of the lung bases, could also be aspiration.  A left endobronchial obstructing lesion is not excluded. Discussed with Dr. Layman of PCCM//will have pulmonology see in the AM. At this time he is stable on BiPAP and transitioned to HFNC and do not feel he requires emergent bronchoscopy or chest tube.    Admitted to hospitalist service for further care.      Final diagnoses:  Multifocal pneumonia  Acute hypoxic respiratory failure (HCC)  Bronchial obstruction    ED Discharge Orders     None          Dreama Longs, MD 11/06/23 1053    Dreama Longs, MD 11/26/23 1241

## 2023-11-05 NOTE — ED Triage Notes (Signed)
 Patient arrived via [facility] from [facility] with shortness of breathx 3 days.  Facility staff reports that patient's Spo@ noted to be 70s-80s before applying Oxygen. Patient with no history of bilateral PEs and cancer in bilateral lungs.  Patient arrived with SpO2 95% on 4L [place].

## 2023-11-05 NOTE — ED Notes (Signed)
 RT and EDP called to bedside due to patient having sudden increase in work of breathing and O2 requirments. Patient SpO2 78% on 6L nasal cannula. Non-rebreather applied with SpO2 reaching 93%.

## 2023-11-06 ENCOUNTER — Inpatient Hospital Stay (HOSPITAL_COMMUNITY)

## 2023-11-06 DIAGNOSIS — Z7189 Other specified counseling: Secondary | ICD-10-CM | POA: Diagnosis not present

## 2023-11-06 DIAGNOSIS — R0609 Other forms of dyspnea: Secondary | ICD-10-CM

## 2023-11-06 DIAGNOSIS — J188 Other pneumonia, unspecified organism: Secondary | ICD-10-CM | POA: Diagnosis not present

## 2023-11-06 DIAGNOSIS — C3490 Malignant neoplasm of unspecified part of unspecified bronchus or lung: Secondary | ICD-10-CM

## 2023-11-06 DIAGNOSIS — J9621 Acute and chronic respiratory failure with hypoxia: Secondary | ICD-10-CM | POA: Diagnosis not present

## 2023-11-06 DIAGNOSIS — Z515 Encounter for palliative care: Secondary | ICD-10-CM

## 2023-11-06 DIAGNOSIS — R918 Other nonspecific abnormal finding of lung field: Secondary | ICD-10-CM | POA: Diagnosis not present

## 2023-11-06 DIAGNOSIS — R1310 Dysphagia, unspecified: Secondary | ICD-10-CM

## 2023-11-06 DIAGNOSIS — J9601 Acute respiratory failure with hypoxia: Secondary | ICD-10-CM

## 2023-11-06 DIAGNOSIS — J90 Pleural effusion, not elsewhere classified: Secondary | ICD-10-CM

## 2023-11-06 DIAGNOSIS — J9809 Other diseases of bronchus, not elsewhere classified: Secondary | ICD-10-CM

## 2023-11-06 LAB — BASIC METABOLIC PANEL WITH GFR
Anion gap: 8 (ref 5–15)
BUN: 18 mg/dL (ref 8–23)
CO2: 31 mmol/L (ref 22–32)
Calcium: 8.5 mg/dL — ABNORMAL LOW (ref 8.9–10.3)
Chloride: 104 mmol/L (ref 98–111)
Creatinine, Ser: 1 mg/dL (ref 0.61–1.24)
GFR, Estimated: >60 mL/min (ref >60)
Glucose, Bld: 131 mg/dL — ABNORMAL HIGH (ref 70–99)
Potassium: 4.2 mmol/L (ref 3.5–5.1)
Sodium: 144 mmol/L (ref 135–145)

## 2023-11-06 LAB — CBC
HCT: 27.2 % — ABNORMAL LOW (ref 39.0–52.0)
Hemoglobin: 8.2 g/dL — ABNORMAL LOW (ref 13.0–17.0)
MCH: 27.3 pg (ref 26.0–34.0)
MCHC: 30.1 g/dL (ref 30.0–36.0)
MCV: 90.7 fL (ref 80.0–100.0)
Platelets: 331 K/uL (ref 150–400)
RBC: 3 MIL/uL — ABNORMAL LOW (ref 4.22–5.81)
RDW: 17.8 % — ABNORMAL HIGH (ref 11.5–15.5)
WBC: 10 K/uL (ref 4.0–10.5)
nRBC: 0.2 % (ref 0.0–0.2)

## 2023-11-06 LAB — URINALYSIS, W/ REFLEX TO CULTURE (INFECTION SUSPECTED)
Bilirubin Urine: NEGATIVE
Glucose, UA: NEGATIVE mg/dL
Hgb urine dipstick: NEGATIVE
Ketones, ur: 5 mg/dL — AB
Leukocytes,Ua: NEGATIVE
Nitrite: NEGATIVE
Protein, ur: NEGATIVE mg/dL
Specific Gravity, Urine: 1.013 (ref 1.005–1.030)
pH: 5 (ref 5.0–8.0)

## 2023-11-06 LAB — GLUCOSE, CAPILLARY: Glucose-Capillary: 103 mg/dL — ABNORMAL HIGH (ref 70–99)

## 2023-11-06 LAB — STREP PNEUMONIAE URINARY ANTIGEN: Strep Pneumo Urinary Antigen: NEGATIVE

## 2023-11-06 LAB — MRSA NEXT GEN BY PCR, NASAL: MRSA by PCR Next Gen: NOT DETECTED

## 2023-11-06 LAB — ECHOCARDIOGRAM COMPLETE
AV Mean grad: 8 mmHg
AV Peak grad: 13.5 mmHg
Ao pk vel: 1.84 m/s
Area-P 1/2: 3.08 cm2
Height: 71 in
S' Lateral: 2.6 cm
Weight: 2098.78 [oz_av]

## 2023-11-06 LAB — PATHOLOGIST SMEAR REVIEW

## 2023-11-06 MED ORDER — CHLORHEXIDINE GLUCONATE CLOTH 2 % EX PADS: 6.0000 | MEDICATED_PAD | Freq: Every day | CUTANEOUS | Status: DC

## 2023-11-06 MED ORDER — BISACODYL 10 MG RE SUPP: 10.0000 mg | Freq: Every day | RECTAL | Status: DC | PRN

## 2023-11-06 MED ORDER — SODIUM CHLORIDE 0.9% FLUSH
3.0000 mL | Freq: Two times a day (BID) | INTRAVENOUS | Status: DC
  Administered 2023-11-06 – 2023-11-08 (×7): 3 mL via INTRAVENOUS

## 2023-11-06 MED ORDER — SENNOSIDES-DOCUSATE SODIUM 8.6-50 MG PO TABS: 1.0000 | ORAL_TABLET | Freq: Every evening | ORAL | Status: DC | PRN

## 2023-11-06 MED ORDER — ONDANSETRON HCL 4 MG/2ML IJ SOLN
4.0000 mg | Freq: Four times a day (QID) | INTRAMUSCULAR | Status: DC | PRN
Start: 2023-11-06 — End: 2023-11-09

## 2023-11-06 MED ORDER — VANCOMYCIN HCL IN DEXTROSE 1-5 GM/200ML-% IV SOLN: 1000.0000 mg | INTRAVENOUS | Status: DC

## 2023-11-06 MED ORDER — ACETAMINOPHEN 650 MG RE SUPP: 650.0000 mg | Freq: Four times a day (QID) | RECTAL | Status: DC | PRN

## 2023-11-06 MED ORDER — FUROSEMIDE 10 MG/ML IJ SOLN
40.0000 mg | Freq: Two times a day (BID) | INTRAMUSCULAR | Status: DC
  Administered 2023-11-06 – 2023-11-08 (×6): 40 mg via INTRAVENOUS
  Filled 2023-11-06 (×6): qty 4

## 2023-11-06 MED ORDER — ONDANSETRON HCL 4 MG PO TABS: 4.0000 mg | ORAL_TABLET | Freq: Four times a day (QID) | ORAL | Status: DC | PRN

## 2023-11-06 MED ORDER — GUAIFENESIN ER 600 MG PO TB12
600.0000 mg | ORAL_TABLET | Freq: Two times a day (BID) | ORAL | Status: DC
  Administered 2023-11-06: 600 mg via ORAL
  Filled 2023-11-06: qty 1

## 2023-11-06 MED ORDER — SODIUM CHLORIDE 0.9 % IV SOLN
2.0000 g | Freq: Two times a day (BID) | INTRAVENOUS | Status: DC
  Administered 2023-11-06 – 2023-11-08 (×5): 2 g via INTRAVENOUS
  Filled 2023-11-06 (×5): qty 12.5

## 2023-11-06 MED ORDER — CARMEX CLASSIC LIP BALM EX OINT: TOPICAL_OINTMENT | CUTANEOUS | Status: DC | PRN

## 2023-11-06 MED ORDER — ROSUVASTATIN CALCIUM 5 MG PO TABS: 5.0000 mg | ORAL_TABLET | Freq: Every day | ORAL | Status: DC

## 2023-11-06 MED ORDER — ORAL CARE MOUTH RINSE
15.0000 mL | OROMUCOSAL | Status: DC
  Administered 2023-11-06 – 2023-11-08 (×12): 15 mL via OROMUCOSAL

## 2023-11-06 MED ORDER — METHYLPREDNISOLONE SODIUM SUCC 40 MG IJ SOLR
10.0000 mg | Freq: Every day | INTRAMUSCULAR | Status: DC
  Administered 2023-11-06 – 2023-11-08 (×3): 10 mg via INTRAVENOUS
  Filled 2023-11-06 (×3): qty 1

## 2023-11-06 MED ORDER — CHLORHEXIDINE GLUCONATE CLOTH 2 % EX PADS
6.0000 | MEDICATED_PAD | Freq: Every evening | CUTANEOUS | Status: DC
  Administered 2023-11-06 – 2023-11-08 (×3): 6 via TOPICAL

## 2023-11-06 MED ORDER — ENOXAPARIN SODIUM 60 MG/0.6ML IJ SOSY
60.0000 mg | PREFILLED_SYRINGE | Freq: Two times a day (BID) | INTRAMUSCULAR | Status: DC
Start: 2023-11-06 — End: 2023-11-08
  Administered 2023-11-06 – 2023-11-08 (×4): 60 mg via SUBCUTANEOUS
  Filled 2023-11-06 (×4): qty 0.6

## 2023-11-06 MED ORDER — PREDNISONE 5 MG PO TABS
10.0000 mg | ORAL_TABLET | Freq: Every day | ORAL | Status: DC
  Filled 2023-11-06: qty 2

## 2023-11-06 MED ORDER — BACLOFEN 10 MG PO TABS: 10.0000 mg | ORAL_TABLET | Freq: Two times a day (BID) | ORAL | Status: DC | PRN

## 2023-11-06 MED ORDER — PANTOPRAZOLE SODIUM 40 MG PO TBEC: 40.0000 mg | DELAYED_RELEASE_TABLET | Freq: Two times a day (BID) | ORAL | Status: DC

## 2023-11-06 MED ORDER — CLOPIDOGREL BISULFATE 75 MG PO TABS
75.0000 mg | ORAL_TABLET | Freq: Every day | ORAL | Status: DC
  Filled 2023-11-06: qty 1

## 2023-11-06 MED ORDER — ACETAMINOPHEN 325 MG PO TABS: 650.0000 mg | ORAL_TABLET | Freq: Four times a day (QID) | ORAL | Status: DC | PRN

## 2023-11-06 MED ORDER — PANTOPRAZOLE SODIUM 40 MG IV SOLR
40.0000 mg | Freq: Two times a day (BID) | INTRAVENOUS | Status: DC
  Administered 2023-11-06 – 2023-11-08 (×5): 40 mg via INTRAVENOUS
  Filled 2023-11-06 (×5): qty 10

## 2023-11-06 MED ORDER — ENOXAPARIN SODIUM 60 MG/0.6ML IJ SOSY
60.0000 mg | PREFILLED_SYRINGE | INTRAMUSCULAR | Status: AC
  Administered 2023-11-06: 60 mg via SUBCUTANEOUS
  Filled 2023-11-06: qty 0.6

## 2023-11-06 MED ORDER — ORAL CARE MOUTH RINSE: 15.0000 mL | OROMUCOSAL | Status: DC | PRN

## 2023-11-06 NOTE — Consult Note (Signed)
 Name: Arthur [name] MRN: [id number] DOB: August 21, 1940    ADMISSION DATE:  11/05/2023  HISTORY OF PRESENT ILLNESS:     83 y.o. male with past medical history significant for  stage IV adenocarcinoma of right lung, CAD s/p DES to LAD (2023), PAF, history of recurrent DVT on Lovenox , history of CVA, who presented with shortness of breath and hypoxia.  Patient is alert and unable to provide information.  Based on the chart, he has started having shortness of breath 4 days ago associated with productive cough and thick sputum.  He normally ambulates with a walker but he has been feeling sick the last few days and not walking much.  He was admitted a month ago for left lower lobe pneumonia.  Patient received CAP coverage.  He was noted to have severe oropharyngeal dysphagia at that time. Pulmonary critical care was consulted due to possible endobronchial mass and bilateral pleural effusion.  PAST MEDICAL HISTORY :   has a past medical history of Diabetes mellitus without complication (HCC), High cholesterol, Hypertension, lung ca, and Normal nuclear stress test (01/29/2008).  has a past surgical history that includes Knee surgery (2005); Cataract extraction, bilateral (2006); LEFT HEART CATH AND CORONARY ANGIOGRAPHY (N/A, 02/06/2021); CORONARY STENT INTERVENTION (N/A, 02/06/2021); Coronary Ultrasound/IVUS (N/A, 02/06/2021); Video bronchoscopy with endobronchial ultrasound (Bilateral, 03/21/2021); Fine needle aspiration (03/21/2021); IR Radiologist Eval & Mgmt (12/24/2021); Esophagogastroduodenoscopy (N/A, 10/17/2022); and biopsy (10/17/2022). Prior to Admission medications   Medication Sig Start Date End Date Taking? Authorizing Provider  acetaminophen  (TYLENOL ) 500 MG tablet Take 1,000 mg by mouth every 8 (eight) hours as needed for mild pain (pain score 1-3).   Yes [provider]  alectinib (ALECENSA ) 150 MG capsule Take 3 capsules (450 mg total) by mouth 2 (two) times daily with a meal. 10/29/23   Yes Sherrod Sherrod, MD  B Complex Vitamins (VITAMIN B COMPLEX) TABS Take 1 tablet by mouth daily.   Yes [provider]  baclofen  (LIORESAL ) 10 MG tablet Take 1 tablet (10 mg total) by mouth 2 (two) times daily as needed for muscle spasms. 01/31/23  Yes Chambliss, Layman CROME, MD  benzonatate  (TESSALON ) 200 MG capsule Take 200 mg by mouth in the morning, at noon, and at bedtime.   Yes [provider]  clopidogrel  (PLAVIX ) 75 MG tablet Take 1 tablet (75 mg total) by mouth daily with breakfast. 09/09/23  Yes Rosendo Rush, MD  diclofenac  Sodium (VOLTAREN ) 1 % GEL Apply 2 g topically as needed (Left shoulder pan). 06/15/20  Yes [provider]  enoxaparin  (LOVENOX ) 80 MG/0.8ML injection Inject 0.7 mLs into the skin every 12 (twelve) hours.   Yes [provider]  lidocaine  4 % Place 1 patch onto the skin daily as needed (Left arm pain).   Yes [provider]  LORazepam (ATIVAN) 0.5 MG tablet Take 0.25 mg by mouth 2 (two) times daily. 11/05/23  Yes [provider]  oxyCODONE  (OXY IR/ROXICODONE ) 5 MG immediate release tablet Take 0.5-1 tablets (2.5-5 mg total) by mouth every 6 (six) hours as needed for moderate pain (pain score 4-6). Patient taking differently: Take 5 mg by mouth every 6 (six) hours as needed for moderate pain (pain score 4-6). 10/10/23  Yes Mabe, Elna, MD  pantoprazole  (PROTONIX ) 40 MG tablet Take 1 tablet (40 mg total) by mouth 2 (two) times daily. 09/23/23  Yes [name], [name], DO  polyethylene glycol (MIRALAX ) 17 g packet Take 17 g by mouth daily. 10/09/23  Yes Janna Ferrier, DO  predniSONE  (DELTASONE ) 10 MG tablet Take 1 tablet (10 mg total) by mouth daily with breakfast. 08/19/23  Yes Heilingoetter, Cassandra L, PA-C  rosuvastatin  (CRESTOR ) 5 MG tablet Take 1 tablet by mouth once daily Patient taking differently: Take 5 mg by mouth at bedtime. 05/19/23  Yes Gomes, Adriana, DO  senna (SENOKOT) 8.6 MG TABS tablet Take 1 tablet (8.6 mg  total) by mouth daily. 10/09/23  Yes Janna Ferrier, DO  sodium chloride  (OCEAN) 0.65 % SOLN nasal spray Place 1 spray into both nostrils as needed for congestion. 09/06/22  Yes Howell Lunger, DO  busPIRone  (BUSPAR ) 5 MG tablet Take 1 tablet (5 mg total) by mouth 2 (two) times daily. Patient not taking: Reported on 11/05/2023 09/09/23   Rosendo Rush, MD  Iron -FA-B Cmp-C-Biot-Probiotic (FUSION PLUS) CAPS Take 1 capsule by mouth once daily 11/04/23   [name], [name], DO  Menthol -Methyl Salicylate  (MUSCLE RUB) 10-15 % CREA Apply 1 Application topically as needed for muscle pain. Patient not taking: Reported on 11/05/2023 10/08/23   Gomes, Adriana, DO   Allergies  Allergen Reactions   Iohexol  Other (See Comments)    Severe ATN after CT with contrast May 2023 - creatinine peaked near 8. Did not require HD.   Ace Inhibitors Cough    FAMILY HISTORY:  family history includes Coronary artery disease in his father and mother. SOCIAL HISTORY:  reports that he has never smoked. He has never used smokeless tobacco. He reports that he does not drink alcohol and does not use drugs.  REVIEW OF SYSTEMS:   Unable to collaborate for ROS.  SUBJECTIVE:    VITAL SIGNS: Temp:  [97.7 F (36.5 C)-98.3 F (36.8 C)] 97.8 F (36.6 C) (10/09 1146) Pulse Rate:  [61-115] 115 (10/09 0100) Resp:  [17-42] 17 (10/09 1400) BP: (92-135)/(40-75) 116/47 (10/09 1400) SpO2:  [80 %-100 %] 100 % (10/09 0833) FiO2 (%):  [80 %-100 %] 80 % (10/08 2009) Weight:  [59.5 kg] 59.5 kg (10/09 0000)  PHYSICAL EXAMINATION: Physical Exam Constitutional:      Appearance: He is ill-appearing.     Comments: Moaning, and respond to questions nodding.  Cardiovascular:     Rate and Rhythm: Normal rate and regular rhythm.  Pulmonary:     Comments: Mild respiratory distress, on 5 L nasal cannula.  Decreased breath sounds bilaterally L>R. Abdominal:     Palpations: Abdomen is soft.  Skin:    General: Skin is warm and dry.   Neurological:     Mental Status: He is disoriented.    Recent Labs  Lab 11/05/23 1700 11/06/23 0319  NA 143 144  K 4.7 4.2  CL 103 104  CO2 33* 31  BUN 16 18  CREATININE 1.01 1.00  GLUCOSE 153* 131*   Recent Labs  Lab 11/05/23 1700 11/06/23 0319  HGB 8.8* 8.2*  HCT 28.0* 27.2*  WBC 7.0 10.0  PLT 362 331   Images: Echocardiogram 11/06/2023:  EF 60 to 65%.  Left ventricular function normal.  No wall motion abnormalities.  RV systolic function mildly reduced.  There is severe elevated pulmonary artery systolic pressure.  RVSP is 67.  Left atrial and right atrial size dilated.  TR is moderate aortic valve is heavily calcified.  CT chest 11/05/2023 Moderate bilateral pleural effusions.  Consolidation greater on the left side.  Debris's in the trachea with complete filling of the left lower lobe bronchus.  Patchy airspace infiltrates in the right lower lobe.  Most likely diagnosis is multifocal pneumonia versus aspiration.  CT chest 10/05/2023: Left lower lobe compatible with pneumonia, aspiration.  Small trace pleural effusions.  ASSESSMENT / PLAN: LLL pneumonia vs aspiration Severe dysphagia Bilateral pleural effusions History of stage IV adenocarcinoma of the lung  83 years old with history of stage IV adenocarcinoma of the lung, who had a month ago and admission for left lower lobe pneumonia, received CAP coverage.  He came back for shortness of breath and hypoxia.  CT scan shows left lower lobe consolidations with bronchogram.  He has bilateral pleural effusions.  There is debris on the trachea and left lower lobe bronchus, that he has been also present in the last CT scan a month ago.  I think patient most likely had an aspiration event, likely in the setting of severe dysphagia in the past.  Initially patient had 12 L of oxygen and currently he is on 5 L nasal cannula.  I would recommend first to treat with antibiotics and start some diuresis for bilateral pleural effusions.  His echo showed normal EF, however he has high RSVP and dilated RA and LA. Patient is on Lovenox  which prevent him to have any pleural procedures, and I would not recommend without trying diuresis first.   For the concern of endobronchial lesion, his prior CT scan showed some trachea debris, however the CT chest shows more consolidation, with bronchogram, suggesting pneumonia.  At this time I would not recommend bronchoscopy given that he is stage IV lung cancer, on anticoagulation, and he is requiring 5L of O2.   Plan: -Antibiotics per primary team -Diuresis prn 1L negative a day -SLP evaluation -Goals of care with family.  Marny Patch, MD Pulmonary and Critical Care Medicine [facility] Pager: [phone]  11/06/2023, 2:45 PM

## 2023-11-06 NOTE — Progress Notes (Signed)
 OT Cancellation Note  Patient Details Name: Arthur [name] MRN: [id number] DOB: [birth year]   Cancelled Treatment:    Reason Eval/Treat Not Completed: Medical issues which prohibited therapy  Per RN, poorly responsive, very weak. Will check back tomorrow.   Surya Folden OT/L Acute Rehabilitation Department  [phone]    11/06/2023, 1:13 PM

## 2023-11-06 NOTE — Progress Notes (Signed)
 PHARMACY - ANTICOAGULATION CONSULT NOTE  Pharmacy Consult for Lovenox  Indication: Recurrent DVT, AFib, h/o CVA  Allergies  Allergen Reactions   Iohexol  Other (See Comments)    Severe ATN after CT with contrast May 2023 - creatinine peaked near 8. Did not require HD.   Ace Inhibitors Cough    Patient Measurements: Height: 5' 11 (180.3 cm) Weight: 59.5 kg (131 lb 2.8 oz) IBW/kg (Calculated) : 75.3 HEPARIN  DW (KG): 59.5  Vital Signs: Temp: 98.2 F (36.8 C) (10/08 2336) Temp Source: Oral (10/08 2336) BP: 118/54 (10/08 2230) Pulse Rate: 110 (10/08 2230)  Labs: Recent Labs    11/05/23 1700  HGB 8.8*  HCT 28.0*  PLT 362  LABPROT 14.4  INR 1.1  CREATININE 1.01    Estimated Creatinine Clearance: 46.6 mL/min (by C-G formula based on SCr of 1.01 mg/dL).   Medical History: Past Medical History:  Diagnosis Date   Diabetes mellitus without complication (HCC)    High cholesterol    Hypertension    lung ca    Normal nuclear stress test 01/29/2008   stress perfusion study apparently in 2010 in [facility] which he said was negative.    Medications:  PTA on Lovenox  70mg  sq q12h  Assessment: 83 yr male admitted with respiratory failure due to multifocal PNA, bilateral pleural effusions.  PMH for AFib, recurrent DVT, chronic hemolytic anemia, CVA, CAD, Stage IV right lung adenocarcinoma.  On Lovenox  PTA and pharmacy asked to continue dosing on admission.  Goal of Therapy:  Full anticoagulation    Plan:  Lovenox  60mg  sq q12h (1mg /kg) Follow CBC  Arvin Gauss, PharmD 11/06/2023,1:20 AM

## 2023-11-06 NOTE — Consult Note (Signed)
 WOC Nurse Consult Note: Reason for Consult: unstageable wounds on toes  Wound type: LLE toe tips and medial great toe; dark purple; can be related to ill fitting shoes in presence of hammer toes and foot deformity Early Deep Tissue Pressure Injury right heel; intact; not open Pressure Injury POA: Yes Measurement:see nursing flow sheets Wound bed:see above  Drainage (amount, consistency, odor) see nursing flow sheets Periwound: intact  Dressing procedure/placement/frequency: Prevalon boots bilaterally to offload heels; high risk for breakdown Single layer of xeroform to the toe tips and gauze. Change every other day Foam the the right heel; change every three days.    Re consult if needed, will not follow at this time. Thanks  Kalima Saylor [employer], RN,CWOCN, CNS, [employer] [phone]

## 2023-11-06 NOTE — Progress Notes (Signed)
 PT Cancellation Note  Patient Details Name: Arthur [name] MRN: [id number] DOB: 08-Dec-1940   Cancelled Treatment:    Reason Eval/Treat Not Completed: Medical issues which prohibited therapy Per RN, poorly responsive, very weak. Will check back tomorrow. Darice Potters PT Acute Rehabilitation Services Office [phone]   Potters Darice Norris 11/06/2023, 11:53 AM

## 2023-11-06 NOTE — Evaluation (Signed)
 Clinical/Bedside Swallow Evaluation Patient Details  Name: Arthur [name] MRN: [id number] Date of Birth: Apr 12, 1940  Today's Date: 11/06/2023 Time: SLP Start Time (ACUTE ONLY): 0840 SLP Stop Time (ACUTE ONLY): 0910 SLP Time Calculation (min) (ACUTE ONLY): 30 min  Past Medical History:  Past Medical History:  Diagnosis Date   Diabetes mellitus without complication (HCC)    High cholesterol    Hypertension    lung ca    Normal nuclear stress test 01/29/2008   stress perfusion study apparently in 2010 in [facility] which he said was negative.   Past Surgical History:  Past Surgical History:  Procedure Laterality Date   BIOPSY  10/17/2022   Procedure: BIOPSY;  Surgeon: Stacia Glendia BRAVO, MD;  Location: [facility] ENDOSCOPY;  Service: Gastroenterology;;   CATARACT EXTRACTION, BILATERAL  2006   CORONARY STENT INTERVENTION N/A 02/06/2021   Procedure: CORONARY STENT INTERVENTION;  Surgeon: Dann Candyce RAMAN, MD;  Location: [facility] INVASIVE CV LAB;  Service: Cardiovascular;  Laterality: N/A;   CORONARY ULTRASOUND/IVUS N/A 02/06/2021   Procedure: Intravascular Ultrasound/IVUS;  Surgeon: Dann Candyce RAMAN, MD;  Location: [facility] INVASIVE CV LAB;  Service: Cardiovascular;  Laterality: N/A;   ESOPHAGOGASTRODUODENOSCOPY N/A 10/17/2022   Procedure: ESOPHAGOGASTRODUODENOSCOPY (EGD);  Surgeon: Stacia Glendia BRAVO, MD;  Location: [facility] ENDOSCOPY;  Service: Gastroenterology;  Laterality: N/A;   FINE NEEDLE ASPIRATION  03/21/2021   Procedure: FINE NEEDLE ASPIRATION;  Surgeon: Brenna Adine CROME, DO;  Location: [facility] ENDOSCOPY;  Service: Pulmonary;;   IR RADIOLOGIST EVAL & MGMT  12/24/2021   KNEE SURGERY  2005   LEFT HEART CATH AND CORONARY ANGIOGRAPHY N/A 02/06/2021   Procedure: LEFT HEART CATH AND CORONARY ANGIOGRAPHY;  Surgeon: Dann Candyce RAMAN, MD;  Location: [facility] INVASIVE CV LAB;  Service: Cardiovascular;  Laterality: N/A;   VIDEO BRONCHOSCOPY WITH ENDOBRONCHIAL ULTRASOUND Bilateral 03/21/2021   Procedure:  VIDEO BRONCHOSCOPY WITH ENDOBRONCHIAL ULTRASOUND;  Surgeon: Brenna Adine CROME, DO;  Location: [facility] ENDOSCOPY;  Service: Pulmonary;  Laterality: Bilateral;   HPI:  Arthur [name] is a 83 y.o. male with medical history significant for stage IV adenocarcinoma of right lung, CAD s/p DES to LAD (2023), PAF, history of recurrent DVT on Lovenox , history of CVA, chronic hemolytic anemia on prednisone  10 mg daily, HTN, HLD who presented to the ED from [facility] health and rehab for evaluation of shortness of breath and hypoxia.     Patient was recently admitted 9/7-9/11 for left lower lobe pneumonia.  Patient was treated with IV ceftriaxone  and azithromycin  while in hospital.  Patient was noted to have severe oropharyngeal dysphagia on swallow study.  He was also noted to have waxing and waning mentation.  EEG noted moderate diffuse encephalopathy.  Patient was discharged to SNF.    Assessment / Plan / Recommendation  Clinical Impression  Pt currently awake but grossly weak with open mouth posture and dried secretions that SlP was able to remove.  He is oriented to [facility] and mouths his correct name and birthdate.   Pt noted to be leaning to the left recurrently during the session, stating this has occured one day.  He requires total cues to follow directions to seal lips on suction with dental brushing, oral care.  Slight right lingual asymmetry, ? atrophy with near aphonia and labial loss of liquids on liquids on left that he does not swallow.   Pt willing to consume intake including [place] ice boluses x3, single ice chips x2 and single tsp of water , requiring suctioning.  He did NOT ELICIT  a swallow with tiny ice boluses nor tsps water  initially despite total cues with verbal/visual and tactile input. He did finally elicit swallow with the thermal/gustatory input from 1/4 tsp of [name] Ice as well as with thin water  via 1/2 tsp.    He cannot conduct chin tuck due to his gross weakness.  Pt will not meet  nutritional needs and is at very high aspiration risk due to severity of dysphagia, gross weakness and lack of ability to cough and expectorate.   Pt did not desire more intake than the few boluses provided - indicating satisfaction.  Phonation improved postswallow of intake.     For comfort, recommend consider FLOOR STOCK ONLY of 1/4 tsp of [name] ice followed by 1/2 tsp water  PRN. Otherwise recommend  NPO - no trays nor PO medications, highly rec consider palliative consult.   SLP Visit Diagnosis: Dysphagia, oropharyngeal phase (R13.12)    Aspiration Risk  Risk for inadequate nutrition/hydration;Severe aspiration risk    Diet Recommendation  (few 1/2 tsp of water  and 1/4 tsp [name] ice for comfort ONLY)    Medication Administration: Via alternative means Supervision: Full supervision/cueing for compensatory strategies Compensations: Slow rate;Small sips/bites Postural Changes: Remain upright for at least 30 minutes after po intake;Seated upright at 90 degrees    Other  Recommendations Oral Care Recommendations: Oral care BID     Assistance Recommended at Discharge  FULL  Functional Status Assessment Patient has had a recent decline in their functional status and demonstrates the ability to make significant improvements in function in a reasonable and predictable amount of time.  Frequency and Duration min 1 x/week  1 week       Prognosis Prognosis for improved oropharyngeal function: Guarded Barriers to Reach Goals: Severity of deficits      Swallow Study   General Date of Onset: 11/06/23 HPI: Arthur [name] is a 83 y.o. male with medical history significant for stage IV adenocarcinoma of right lung, CAD s/p DES to LAD (2023), PAF, history of recurrent DVT on Lovenox , history of CVA, chronic hemolytic anemia on prednisone  10 mg daily, HTN, HLD who presented to the ED from [facility] health and rehab for evaluation of shortness of breath and hypoxia.     Patient was recently  admitted 9/7-9/11 for left lower lobe pneumonia.  Patient was treated with IV ceftriaxone  and azithromycin  while in hospital.  Patient was noted to have severe oropharyngeal dysphagia on swallow study.  He was also noted to have waxing and waning mentation.  EEG noted moderate diffuse encephalopathy.  Patient was discharged to SNF. Diet Prior to this Study: Thin liquids (Level 0);Dysphagia 2 (finely chopped) Temperature Spikes Noted: No Respiratory Status: Nasal cannula History of Recent Intubation: No Behavior/Cognition: Lethargic/Drowsy Oral Cavity Assessment: Dry Oral Care Completed by SLP: Yes Oral Cavity - Dentition: Adequate natural dentition;Other (Comment) (missing some dentition) Vision: Impaired for self-feeding Self-Feeding Abilities: Total assist Patient Positioning: Upright in bed Baseline Vocal Quality: Low vocal intensity (nearly aphonic, improved after few 1/2 tsp liquids) Volitional Cough: Weak Volitional Swallow: Unable to elicit    Oral/Motor/Sensory Function Overall Oral Motor/Sensory Function: Generalized oral weakness Facial ROM: Reduced right;Reduced left Facial Strength: Reduced right;Reduced left Lingual ROM: Reduced right;Reduced left Lingual Symmetry: Abnormal symmetry right;Suspected CN XII (hypoglossal) dysfunction Lingual Strength: Reduced Lingual Sensation: Other (Comment) Velum: Within Functional Limits Mandible: Impaired   Ice Chips Ice chips: Impaired Presentation: Spoon Oral Phase Impairments: Reduced labial seal;Reduced lingual movement/coordination;Poor awareness of bolus Oral Phase Functional Implications: Other (  comment);Oral holding;Left anterior spillage;Left lateral sulci pocketing   Thin Liquid Thin Liquid: Impaired Presentation: Spoon Oral Phase Impairments: Reduced labial seal Oral Phase Functional Implications: Left anterior spillage;Prolonged oral transit;Oral holding Pharyngeal  Phase Impairments: Suspected delayed Swallow    Nectar  Thick Nectar Thick Liquid: Not tested   Honey Thick Honey Thick Liquid: Not tested   Puree Puree: Not tested   Solid     Solid: Not tested      Nicolas Emmie Caldron 11/06/2023,9:44 AM  Madelin POUR, MS [facility] SLP Acute Rehab Services Office [phone]

## 2023-11-06 NOTE — Consult Note (Signed)
 Consultation Note Date: 11/06/2023   Patient Name: Arthur [name]  DOB: 1940-09-24  MRN: [id number]  Age / Sex: 83 y.o., male   PCP: Arthur Blizzard, DO Referring Physician: [place], Eric J, DO  Reason for Consultation: Establishing goals of care     Chief Complaint/History of Present Illness:   Patient is an 83 year old male with a past medical history of stage IV adenocarcinoma of the right lung, CAD s/p DES to LAD (2023), PAF, history of recurrent DVT on Lovenox , history of CVA, hypertension, hyperlipidemia, and chronic hemolytic anemia on prednisone  10 mg daily as he was admitted on 11/05/2023 from [facility] for management of shortness of breath and hypoxia.  Patient had recently been admitted from 9/7-11 for management of pneumonia and was noted to have severe oropharyngeal dysphagia at that time.  During this admission, patient found to have acute hypoxic respiratory failure secondary to multifocal pneumonia with bilateral pleural effusions.  SLP evaluation showed high risk for aspiration and possible contributing to multifocal pneumonia.  Palliative medicine team consulted to assist with complex medical decision making.  Extensive review of EMR including recent documentation from hospitalist, SLP, and WOC.  Personally reviewed CMP noting albumin  low at 2.8 and total bilirubin slightly high at 1.3.  Personally reviewed recent chest CT noting bilateral consolidation and debris in trachea.  Discussed care with hospitalist and RN to coordinate care and for medical updates.  Hospitalist had already discussed care with patient's HCPOA and noted change of CODE STATUS to DNR/DNI.  Patient does have ACP documentation on file which was reviewed.  Patient named Arthur [name], son, as his HCPOA and Arthur [name] as his primary alternate.  Initially presented to bedside though family had stepped away.  Patient laying sleeping in bed though easily awakened.  Introduced myself as a  member of the palliative medicine team.  Patient noted that he was hungry and wanted to be [name] to eat.  Patient also expressed that he normally has coffee every morning and he wanted his coffee.  Explained that patient's swallowing  is not working well so unfortunately cannot provide this though can discussed with family about care moving forward and patient gave thumbs up to this.  Represented to bedside later in day once RN informed patient's family present.  When presenting to bedside, [name] to introduce myself as a member of the palliative medicine team my role in patient's medical journey.  Family present included patient's wife, patient's son, and patient's daughter.  [name] to provide medical updates about patient's care at this time.  Expressed primary concern is patient's difficulty with swallowing which is leading to aspiration and contributing to his multifocal pneumonia.  Spent time discussing that SLP has evaluated.  Based on evaluation, concern that anything patient swallows, even his own saliva, can be going down the wrong way and would lead to pneumonia.  Discussed there is no medical way to fix this.  Spent time answering questions as [name] regarding this.  With permission, [name] to discuss pathways for medical care moving forward.  Discussed current aggressive medical pathway patient is currently on.  Patient receiving antibiotics and not being allowed to eat for comfort as worried about aspiration risk.  Explained that even with this, patient again can deteriorate because his dysphagia will lead to pneumonia again with time.  Discussed alternative medical pathway of available which would include allowing patient to eat for comfort.  Spent time explaining that when someone is allowed to eat for comfort  was severe dysphagia, it is truly focusing on quality of life and not quantity.  Discussed that when people are allowed to eat for comfort, would provide medications to help with work of breathing  when aspiration occurs.  Noted comfort care would be focused on symptom management at end-of-life knowing patient is going to die from his underlying medical illness.  Spent time answering questions as [name] regarding this. Family inquired about where patient could receive comfort focused care.  Discussed this type of care can be initiated in the hospital though would then need to seek hospice support in the outpatient setting for continuing it.  Discussed based on patient's current evaluation, may be appropriate for inpatient hospice referral.  Explained inpatient hospice and the philosophy of symptom management at end-of-life.  Discussed how 1 has to be excepted to inpatient hospice after evaluation.  Explained involvement of TOC and hospice liaison to coordinate care.  Family specifically stated that if he were to go to a hospice facility, they would want to be here in [place].  Noted the only hospice facility in [place] is [facility] which is owned by [facility].  Daughter asked for information about facility so she could look it up and so this was provided.  Spent time answering questions as [name] regarding this.  At this time, family noted they wanted time to discuss medical pathways and how they wanted to proceed.  They are planning to speak with other family members as well.  Noted right now continuing with appropriate medical interventions and they can inform care team if decision made to transition to comfort focused care.  All questions answered at that time.  Noted palliative medicine team continue to follow along with patient's medical journey.  Discussed with care team including hospitalist, RN, SLP, and oncology after visit.  Primary Diagnoses  Present on Admission:  Acute on chronic respiratory failure with hypoxia (HCC)  Coronary artery disease due to lipid rich plaque  Non-small cell carcinoma of lung, stage 4 (HCC)  Paroxysmal atrial fibrillation (HCC)  Past Medical History:   Diagnosis Date   Diabetes mellitus without complication (HCC)    High cholesterol    Hypertension    lung ca    Normal nuclear stress test 01/29/2008   stress perfusion study apparently in 2010 in [facility] which he said was negative.   Social History   Socioeconomic History   Marital status: Married    Spouse name: Not on file   Number of children: Not on file   Years of education: Not on file   Highest education level: Not on file  Occupational History   Not on file  Tobacco Use   Smoking status: Never   Smokeless tobacco: Never  Substance and Sexual Activity   Alcohol use: No   Drug use: No   Sexual activity: Not on file  Other Topics Concern   Not on file  Social History Narrative   Social History:   He works part-time as a Airline pilot. He is married with 3 children. He's never smoked cigarettes and does not drink alcohol.            Social Drivers of Corporate investment banker Strain: Low Risk  (10/28/2022)   Overall Financial Resource Strain (CARDIA)    Difficulty of Paying Living Expenses: Not hard at all  Food Insecurity: No Food Insecurity (11/06/2023)   Hunger Vital Sign    Worried About Running Out of Food in the Last Year: Never true  Ran Out of Food in the Last Year: Never true  Transportation Needs: No Transportation Needs (11/06/2023)   PRAPARE - Administrator, Civil Service (Medical): No    Lack of Transportation (Non-Medical): No  Physical Activity: Insufficiently Active (10/28/2022)   Exercise Vital Sign    Days of Exercise per Week: 3 days    Minutes of Exercise per Session: 30 min  Stress: No Stress Concern Present (10/28/2022)   [employer] of Occupational Health - Occupational Stress Questionnaire    Feeling of Stress : Not at all  Social Connections: Moderately Integrated (11/06/2023)   Social Connection and Isolation Panel    Frequency of Communication with Friends and Family: More than three times a week     Frequency of Social Gatherings with Friends and Family: Three times a week    Attends Religious Services: 1 to 4 times per year    Active Member of Clubs or Organizations: No    Attends Banker Meetings: Never    Marital Status: Married   Family History  Problem Relation Age of Onset   Coronary artery disease Father    Coronary artery disease Mother    Scheduled Meds:  Chlorhexidine  Gluconate Cloth  6 each Topical Nightly   enoxaparin  (LOVENOX ) injection  60 mg Subcutaneous Q12H   furosemide   40 mg Intravenous Q12H   methylPREDNISolone  (SOLU-MEDROL ) injection  10 mg Intravenous Daily   mouth rinse  15 mL Mouth Rinse 4 times per day   pantoprazole  (PROTONIX ) IV  40 mg Intravenous Q12H   sodium chloride  flush  3 mL Intravenous Q12H   Continuous Infusions:  ceFEPime  (MAXIPIME ) IV Stopped (11/06/23 0649)   vancomycin      PRN Meds:.acetaminophen  **OR** acetaminophen , bisacodyl, ondansetron  **OR** ondansetron  (ZOFRAN ) IV, mouth rinse Allergies  Allergen Reactions   Iohexol  Other (See Comments)    Severe ATN after CT with contrast May 2023 - creatinine peaked near 8. Did not require HD.   Ace Inhibitors Cough   CBC:    Component Value Date/Time   WBC 10.0 11/06/2023 0319   HGB 8.2 (L) 11/06/2023 0319   HGB 9.2 (L) 09/10/2023 1039   HGB 8.8 (L) 06/25/2023 1159   HCT 27.2 (L) 11/06/2023 0319   HCT 27.8 (L) 06/25/2023 1159   PLT 331 11/06/2023 0319   PLT 436 (H) 09/10/2023 1039   PLT 400 06/25/2023 1159   MCV 90.7 11/06/2023 0319   MCV 91 06/25/2023 1159   NEUTROABS 6.0 11/05/2023 1700   NEUTROABS 5.0 06/25/2023 1159   LYMPHSABS 0.6 (L) 11/05/2023 1700   LYMPHSABS 1.6 06/25/2023 1159   MONOABS 0.4 11/05/2023 1700   EOSABS 0.0 11/05/2023 1700   EOSABS 0.2 06/25/2023 1159   BASOSABS 0.0 11/05/2023 1700   BASOSABS 0.1 06/25/2023 1159   Comprehensive Metabolic Panel:    Component Value Date/Time   NA 144 11/06/2023 0319   NA 141 10/15/2022 1229   K 4.2  11/06/2023 0319   CL 104 11/06/2023 0319   CO2 31 11/06/2023 0319   BUN 18 11/06/2023 0319   BUN 34 (H) 10/15/2022 1229   CREATININE 1.00 11/06/2023 0319   CREATININE 1.17 09/10/2023 1039   CREATININE 1.18 11/15/2015 1114   GLUCOSE 131 (H) 11/06/2023 0319   CALCIUM  8.5 (L) 11/06/2023 0319   AST 20 11/05/2023 1700   AST 26 09/10/2023 1039   ALT 13 11/05/2023 1700   ALT 20 09/10/2023 1039   ALKPHOS 75 11/05/2023 1700   BILITOT 1.3 (H)  11/05/2023 1700   BILITOT 1.3 (H) 09/10/2023 1039   PROT 5.3 (L) 11/05/2023 1700   PROT 6.9 07/17/2021 1103   ALBUMIN  2.8 (L) 11/05/2023 1700   ALBUMIN  3.5 (L) 07/17/2021 1103    Physical Exam: Vital Signs: BP (!) 119/41   Pulse (!) 115   Temp 98.3 F (36.8 C) (Axillary)   Resp (!) 24   Ht 5' 11 (1.803 m)   Wt 59.5 kg   SpO2 100%   BMI 18.30 kg/m  SpO2: SpO2: 100 % O2 Device: O2 Device: High Flow Nasal Cannula O2 Flow Rate: O2 Flow Rate (L/min): (S) 5 L/min Intake/output summary:  Intake/Output Summary (Last 24 hours) at 11/06/2023 1040 Last data filed at 11/06/2023 0649 Gross per 24 hour  Intake 505.84 ml  Output 300 ml  Net 205.84 ml   LBM: Last BM Date : 11/06/23 Baseline Weight: Weight: 59.5 kg Most recent weight: Weight: 59.5 kg  General: Awake though quickly falling asleep, frail, cachectic Cardiovascular: RRR Respiratory: Slightly increased work of breathing noted, on HFNC 5 L/min Neuro: Awake though quickly falls asleep         Palliative Performance Scale: 10%              Additional Data Reviewed: Recent Labs    11/05/23 1700 11/06/23 0319  WBC 7.0 10.0  HGB 8.8* 8.2*  PLT 362 331  NA 143 144  BUN 16 18  CREATININE 1.01 1.00    Imaging: CT Chest Wo Contrast CLINICAL DATA:  Shortness of breath. Abnormal chest radiograph with new opacification in volume loss on the left. Shortness of breath for 3 days.  EXAM: CT CHEST WITHOUT CONTRAST  TECHNIQUE: Multidetector CT imaging of the chest was performed  following the standard protocol without IV contrast.  RADIATION DOSE REDUCTION: This exam was performed according to the departmental dose-optimization program which includes automated exposure control, adjustment of the mA and/or kV according to patient size and/or use of iterative reconstruction technique.  COMPARISON:  Chest radiograph 11/05/2023.  CT chest 10/05/2023  FINDINGS: Cardiovascular: Cardiac enlargement. Small pericardial effusion. Normal caliber thoracic aorta. Calcification in the aorta and coronary arteries. Probable coronary artery stent.  Mediastinum/Nodes: Esophagus is decompressed. No significant lymphadenopathy. Thyroid  gland is unremarkable.  Lungs/Pleura: Moderate bilateral pleural effusions. Bilateral basilar atelectasis or consolidation, greater on the left. Air bronchograms are present. Debris demonstrated in the trachea with filling of the lower lobe bronchus. This could be due to aspirated contents, mucous plugging, or an endobronchial obstructing lesion. Follow-up after resolution of acute process is recommended. Patchy airspace infiltrates demonstrated in the right lung base and throughout the aerated left lung. This is likely multifocal pneumonia or aspiration. Diffuse emphysematous changes in the lungs. No pneumothorax.  Upper Abdomen: Calcification throughout the pancreas consistent with chronic pancreatitis. An inferior vena caval filter is present. 3 mm stone in the right kidney. No hydronephrosis.  Musculoskeletal: Degenerative changes in the spine. No acute bony abnormalities. Old rib fractures.  IMPRESSION: 1. Moderate bilateral pleural effusions. 2. Bilateral basilar atelectasis or consolidation, greater on the left. 3. Debris in the trachea with complete filling of the left lower lobe bronchus. 4. Patchy airspace infiltrates in the right lower lung and throughout the aerated left lung with left central consolidation. 5. Altogether,  changes likely represent multifocal pneumonia with compressive atelectasis and consolidation in the lung bases. Aspiration could also have this appearance. A left endobronchial obstructing lesion is not excluded and follow-up after resolution of acute process is recommended. 6.  Underlying emphysematous changes in the lungs. 7. Cardiac enlargement with small pericardial effusion. 8. Nonobstructing stone in the right kidney. 9. Pancreatic calcification consistent with chronic pancreatitis.  Electronically Signed   By: Elsie Gravely M.D.   On: 11/05/2023 20:35 DG Chest 2 View CLINICAL DATA:  Shortness of breath for 3 days.  EXAM: CHEST - 2 VIEW  COMPARISON:  10/05/2023  FINDINGS: There is near complete opacification of the left lung with some residual aeration in the apex. Diffuse volume loss on the left. This is all new since previous study. This likely represents pleural effusion and collapse/consolidation of the left lung. Follow-up to resolution is recommended to exclude a centrally obstructing lesion. Compensatory hyperinflation of the right lung. No pneumothorax. Heart size is obscured but appears mildly enlarged.  IMPRESSION: New finding of volume loss and near complete opacification of the left chest. This likely represents pleural effusion with collapse and consolidation in the left lung. Follow-up to resolution is recommended.  Electronically Signed   By: Elsie Gravely M.D.   On: 11/05/2023 17:24    I personally reviewed recent imaging.   Palliative Care Assessment and Plan Summary of Established Goals of Care and Medical Treatment Preferences   Patient is an 83 year old male with a past medical history of stage IV adenocarcinoma of the right lung, CAD s/p DES to LAD (2023), PAF, history of recurrent DVT on Lovenox , history of CVA, hypertension, hyperlipidemia, and chronic hemolytic anemia on prednisone  10 mg daily as he was admitted on 11/05/2023 from [facility] for management of shortness of breath and hypoxia.  Patient had recently been admitted from 9/7-11 for management of pneumonia and was noted to have severe oropharyngeal dysphagia at that time.  During this admission, patient found to have acute hypoxic respiratory failure secondary to multifocal pneumonia with bilateral pleural effusions.  SLP evaluation showed high risk for aspiration and possible contributing to multifocal pneumonia.  Palliative medicine team consulted to assist with complex medical decision making.  # Complex medical decision making/goals of care  - Patient unable to participate in complex medical decision making at time of visit.  Discussed care with patient's wife, son/HCPOA, and daughter at bedside.  Spent time discussing pathways for medical care moving forward.  Family going to discuss and consider option of transitioning to comfort focused care.  At this time continuing current medical pathway with appropriate medical interventions.  Noted palliative medicine team continue to follow along to engage in conversations as [name] and appropriate.  -  Code Status: Limited: Do not attempt resuscitation (DNR) -DNR-LIMITED -Do Not Intubate/DNI    # Psycho-social/Spiritual Support:  - Support System: Wife, son, daughters  - Son, Cresencio, is HCPOA  # Discharge Planning:  To Be Determined  Thank you for allowing the palliative care team to participate in the care Korie R Gaut.  Tinnie Radar, DO Palliative Care Provider PMT # [phone]  If patient remains symptomatic despite maximum doses, please call PMT at [phone] between 0700 and 1900. Outside of these hours, please call attending, as PMT does not have night coverage.  Personally spent 75 minutes in patient care including extensive chart review (labs, imaging, progress/consult notes, vital signs), medically appropraite exam, discussed with treatment team, education to patient, family, and staff,  documenting clinical information, medication review and management, coordination of care, and available advanced directive documents.

## 2023-11-06 NOTE — Progress Notes (Signed)
 Patient asked where his phone and eyeglasses were. Checked in patient belonging bag from ED, only clothing in bag. Called ED, no cell phone or eyeglasses were found in the room after they cleaned it up.

## 2023-11-06 NOTE — Hospital Course (Signed)
 Arthur [name] is a 83 y.o. male with medical history significant for stage IV adenocarcinoma of right lung, CAD s/p DES to LAD (2023), PAF, history of recurrent DVT on Lovenox , history of CVA, chronic hemolytic anemia on prednisone  10 mg daily, HTN, HLD who is admitted with acute hypoxic respiratory failure due to multifocal pneumonia and bilateral pleural effusions.

## 2023-11-06 NOTE — Progress Notes (Signed)
 PROGRESS NOTE    Arthur [name]  FMW:[id number] DOB: 14-Oct-1940 DOA: 11/05/2023 PCP: Stoney Blizzard, DO    Brief Narrative:   Arthur [name] is a 83 y.o. male with past medical history significant for  stage IV adenocarcinoma of right lung, CAD s/p DES to LAD (2023), PAF, history of recurrent DVT on Lovenox , history of CVA, chronic hemolytic anemia on prednisone  10 mg daily, HTN, HLD who presented to [facility] ED on 11/05/2023 from [facility] for evaluation of shortness of breath and hypoxia. Patient reports developing shortness of breath about 4 days ago.  He has had associated cough productive of thick white sputum.  He says he normally ambulates with use of a walker but has felt so sick the last few days he has not been walking much at all.   Patient was recently admitted 9/7-9/11 for left lower lobe pneumonia.  Patient was treated with IV ceftriaxone  and azithromycin  while in hospital.  Patient was noted to have severe oropharyngeal dysphagia on swallow study.  He was also noted to have waxing and waning mentation.  EEG noted moderate diffuse encephalopathy.  Patient was discharged to SNF.   In the ED, BP 135/59, pulse 106, RR 29, temp 98.3 F, SpO2 100% on 4 L O2 via [place].  SpO2 dropped to 78% while on 6 L.  Patient was transitioned to NRB, then BiPAP with improvement in SpO2. WBC 7.0, hemoglobin 8.8, platelets 362, sodium 143, potassium 4.7, bicarb 33, BUN 16, creatinine 1.01, serum glucose 153, proBNP 3136, troponin T 56 > 53, lactic acid 0.7. Blood cultures ordered and UA pending collection. CXR with volume loss and near complete opacification of the left chest likely representing pleural effusion with collapse and consolidation in the left lung.   CT chest without contrast: Moderate bilateral pleural effusions, bilateral basilar atelectasis or consolidation, greater on the left, debris in the trachea with complete filling of the left lower lobe bronchus, patchy airspace  infiltrates in the right lower lung and throughout the aerated left lung with left central consolidation c/w multifocal pneumonia with compressive atelectasis and consolidation in the lung bases. Aspiration could also have this appearance. A left endobronchial obstructing lesion is not excluded and follow-up after resolution of acute process is recommended. Underlying emphysematous changes in the lungs. Cardiac enlargement with small pericardial effusion. Nonobstructing stone in the right kidney. Pancreatic calcification consistent with chronic pancreatitis.   Patient was given IV vancomycin  and cefepime .  EDP discussed with PCCM (Dr. Layman), their team will see in consultation in the morning and are available overnight if needed.  The hospitalist service was consulted for admission.  Assessment & Plan:   Acute hypoxic respiratory failure due to multifocal pneumonia Bilateral pleural effusions: Recent admission for LLL PNA now admitted with hypoxic respiratory failure due to multifocal pneumonia with bilateral pleural effusions and bibasilar atelectasis.  Aspiration risk is high and potentially contributing.  Left endobronchial obstructing lesion not excluded. -- PCCM following, appreciate assistance --TTE with LVEF 60 to 65%, biatrial enlargement, moderate TR, trivial MR, noted AAS, IVC dilated -- Blood Cultures x 2: Pending -- Strep pneumonia and Legionella urine antigen: Pending -- Lasix  40 mg IV every 12 hours -- Vancomycin , pharmacy consult for dosing/monitoring -- Cefepime  2 g IV every 12 hours -- Continue supplemental O2, wean down to 4 L nasal cannula this morning -- BiPAP available as needed -- Strict I's and O's and daily weights  -- Aspiration precautions -- Repeat chest x-ray in a.m.  Dysphagia -- Seen by SLP 10/9, recommend few 1/2 tsp of water  and 1/4 tsp [name] ice for comfort ONLY; otherwise n.p.o. -- Aspiration precaution   Paroxysmal atrial fibrillation: Remains in  atrial fibrillation, HR mostly <110.  Not on rate or rhythm controlling agents as an outpatient.  -- Continue Lovenox  anticoagulation.   History of recurrent DVT: -- Continue Lovenox .   Chronic hemolytic anemia: Hemoglobin stable at 8.8.   -- Solu-Medrol  10 mg IV every 24 hours (substituted for home prednisone  10 mg 2/2 dysphagia) Continue home prednisone  10 mg daily.   History of CVA: -- Continue Lovenox  -- Plavix /statin on hold due to dysphagia, n.p.o. status   CAD s/p DES to LAD 2023: -- Plavix  and rosuvastatin  on hold due to n.p.o. status   Hypertension: Does not appear to be on antihypertensives as an outpatient.  BP is currently stable.  Continue to monitor.   Stage IV right lung adenocarcinoma: Follows with oncology, Dr. Sherrod.  Current therapy is with alectinib, will hold for now.  Pressure injury, stage II right ankle POA Wound 11/06/23 0100 Pressure Injury Ankle Right;Lateral Stage 2 -  Partial thickness loss of dermis presenting as a shallow open injury with a red, pink wound bed without slough. (Active)  -- Seen by wound RN -- Prevalon boots bilaterally to offload heels; high risk for breakdown -- Single layer of xeroform to the toe tips and gauze. Change every other day -- Foam the the right heel; change every three days.     Adult failure to thrive Patient with recurrent hospitalizations with pneumonia likely complicated by recurrent aspiration events.  Patient appears cachectic with adult failure to thrive.  Discussed with patient's son at bedside on 10/9, will update CODE STATUS to reflect DNR/DNI given that he would have a poor outcome during the resuscitative event.  Discussed also given continued decline would recommend transitioning to a more comfort approach/hospice; but son would like to give treatment opportunity for about 2 days before further deciding on any other changes.  Palliative care consulted, discussed with Dr. Clayton today.  Overall poor prognosis  given his comorbidities advanced metastatic lung cancer and adult failure to thrive.   DVT prophylaxis: Lovenox     Code Status: Limited: Do not attempt resuscitation (DNR) -DNR-LIMITED -Do Not Intubate/DNI  Family Communication: Updated son present at bedside this morning  Disposition Plan:  Level of care: Stepdown Status is: Inpatient Remains inpatient appropriate because: IV antibiotics, IV diuresis, weaning of supplemental oxygen, therapy evaluation, palliative care consultation, overall prognosis poor    Consultants:  PCCM Palliative care  Procedures:  None  Antimicrobials:  Vancomycin  10/8>> Cefepime  10/8>>   Subjective: Patient seen examined bedside, lying in bed.  Somnolent but arousable.  Son present at bedside.  Discussed overall poor prognosis given his recurrent aspiration/multifocal pneumonia as well as his comorbidities including stage IV lung cancer.  Son understanding, agrees to DNR/DNI but wants to see how treatment goes over the next 2 days before deciding about potential transition to comfort measures.  Patient reports his breathing has improved, oxygen now titrated down to 4 L nasal cannula.  No other specific complaints or concerns at this time.  Patient denies headache, no chest pain, no abdominal pain, no fever.  Discussed with RN at bedside this morning.  Also discussed with PCCM and palliative care.  Objective: Vitals:   11/06/23 0605 11/06/23 0744 11/06/23 0800 11/06/23 0833  BP:   (!) 119/41   Pulse:      Resp: ROLLEN)  25  (!) 29 (!) 24  Temp:  98.3 F (36.8 C)    TempSrc:  Axillary    SpO2: 100%   100%  Weight:      Height:        Intake/Output Summary (Last 24 hours) at 11/06/2023 1142 Last data filed at 11/06/2023 0649 Gross per 24 hour  Intake 505.84 ml  Output 300 ml  Net 205.84 ml   Filed Weights   11/06/23 0000  Weight: 59.5 kg    Examination:  Physical Exam: GEN: NAD, somnolent, but arousable, chronically cachectic/ill in  appearance HEENT: NCAT, PERRL, sclera clear, dry mucous membranes PULM: Poor to little air movement left hemithorax, right hemithorax with crackles, no wheezing, normal respiratory effort without accessory muscle use, on 4 L nasal cannula with SpO2 100% at rest CV: RRR w/o M/G/R GI: abd soft, NTND, + BS MSK: no peripheral edema Integumentary: Right ankle wound as depicted below    Data Reviewed: I have personally reviewed following labs and imaging studies  CBC: Recent Labs  Lab 11/05/23 1700 11/06/23 0319  WBC 7.0 10.0  NEUTROABS 6.0  --   HGB 8.8* 8.2*  HCT 28.0* 27.2*  MCV 90.0 90.7  PLT 362 331   Basic Metabolic Panel: Recent Labs  Lab 11/05/23 1700 11/06/23 0319  NA 143 144  K 4.7 4.2  CL 103 104  CO2 33* 31  GLUCOSE 153* 131*  BUN 16 18  CREATININE 1.01 1.00  CALCIUM  8.7* 8.5*   GFR: Estimated Creatinine Clearance: 47.1 mL/min (by C-G formula based on SCr of 1 mg/dL). Liver Function Tests: Recent Labs  Lab 11/05/23 1700  AST 20  ALT 13  ALKPHOS 75  BILITOT 1.3*  PROT 5.3*  ALBUMIN  2.8*   No results for input(s): LIPASE, AMYLASE in the last 168 hours. No results for input(s): AMMONIA in the last 168 hours. Coagulation Profile: Recent Labs  Lab 11/05/23 1700  INR 1.1   Cardiac Enzymes: No results for input(s): CKTOTAL, CKMB, CKMBINDEX, TROPONINI in the last 168 hours. BNP (last 3 results) Recent Labs    11/05/23 1700  PROBNP 3,136.0*   HbA1C: No results for input(s): HGBA1C in the last 72 hours. CBG: Recent Labs  Lab 11/06/23 0821  GLUCAP 103*   Lipid Profile: No results for input(s): CHOL, HDL, LDLCALC, TRIG, CHOLHDL, LDLDIRECT in the last 72 hours. Thyroid  Function Tests: No results for input(s): TSH, T4TOTAL, FREET4, T3FREE, THYROIDAB in the last 72 hours. Anemia Panel: No results for input(s): VITAMINB12, FOLATE, FERRITIN, TIBC, IRON , RETICCTPCT in the last 72 hours. Sepsis  Labs: Recent Labs  Lab 11/05/23 1711 11/05/23 1859  LATICACIDVEN 0.9 0.7    Recent Results (from the past 240 hours)  Culture, blood (Routine x 2)     Status: None (Preliminary result)   Collection Time: 11/05/23  5:03 PM   Specimen: BLOOD RIGHT ARM  Result Value Ref Range Status   Specimen Description   Final    BLOOD RIGHT ARM Performed at [facility], [address], [place], [place] [address]    Special Requests   Final    BOTTLES DRAWN AEROBIC AND ANAEROBIC Blood Culture adequate volume Performed at [facility], [address] [place], [place] [address]    Culture   Final    NO GROWTH < 12 HOURS Performed at [facility], [address], [place], [place] [address]    Report Status PENDING  Incomplete  Culture, blood (Routine x  2)     Status: None (Preliminary result)   Collection Time: 11/05/23  5:52 PM   Specimen: BLOOD  Result Value Ref Range Status   Specimen Description   Final    BLOOD LEFT ANTECUBITAL Performed at [facility], [address] [place], [place] [address]    Special Requests   Final    BOTTLES DRAWN AEROBIC AND ANAEROBIC Blood Culture results may not be optimal due to an inadequate volume of blood received in culture bottles Performed at [facility], [address], [place], [place] [address]    Culture   Final    NO GROWTH < 12 HOURS Performed at [facility], [address], [place], [place] [address]    Report Status PENDING  Incomplete  MRSA Next Gen by PCR, Nasal     Status: None   Collection Time: 11/05/23 10:00 PM   Specimen: Nasal Mucosa; Nasal Swab  Result Value Ref Range Status   MRSA by PCR Next Gen NOT DETECTED NOT DETECTED Final    Comment: (NOTE) The GeneXpert MRSA Assay (FDA approved for NASAL specimens only), is one component of a comprehensive MRSA colonization surveillance program. It is not intended to diagnose MRSA infection nor to guide or  monitor treatment for MRSA infections. Test performance is not FDA approved in patients less than 83 years old. Performed at [facility], [address], [place], [place] [address]          Radiology Studies: ECHOCARDIOGRAM COMPLETE Result Date: 11/06/2023    ECHOCARDIOGRAM REPORT   Patient Name:   Arthur [name] [facility] Date of Exam: 11/06/2023 Medical Rec #:  [id number]         Height:       71.0 in Accession #:    [id number]        Weight:       131.2 lb Date of Birth:  05/09/1940          BSA:          1.763 m Patient Age:    83 years          BP:           123/45 mmHg Patient Gender: M                 HR:           87 bpm. Exam Location:  Inpatient Procedure: 2D Echo, Cardiac Doppler and Color Doppler (Both Spectral and Color            Flow Doppler were utilized during procedure). Indications:    Dyspnea R06.00  History:        Patient has prior history of Echocardiogram examinations, most                 recent 06/21/2021. Risk Factors:Hypertension and Diabetes.  Sonographer:    Jayson Gaskins Referring Phys: [id number] Carden Teel J [place] IMPRESSIONS  1. Left ventricular ejection fraction, by estimation, is 60 to 65%. The left ventricle has normal function. The left ventricle has no regional wall motion abnormalities. There is mild asymmetric left ventricular hypertrophy. Left ventricular diastolic parameters are indeterminate.  2. Right ventricular systolic function is mildly reduced. The right ventricular size is mildly enlarged. There is severely elevated pulmonary artery systolic pressure. The estimated right ventricular systolic pressure is 67.1 mmHg.  3. Left atrial size was moderately dilated.  4. Right atrial size was severely dilated.  5. The mitral  valve is normal in structure. Trivial mitral valve regurgitation. No evidence of mitral stenosis.  6. Tricuspid valve regurgitation is moderate.  7. The aortic valve is heavily calcified and has very limited mobility. Visually appears to  have severe AS but gradients consistent with only mild AS. Would repeat Doppler interrogation or consider TEE to further evaluate. The aortic valve is tricuspid.  There is moderate calcification of the aortic valve. Aortic valve regurgitation is not visualized. Mild to moderate aortic valve stenosis. Aortic valve mean gradient measures 8.0 mmHg. Aortic valve Vmax measures 1.84 m/s.  8. The inferior vena cava is dilated in size with <50% respiratory variability, suggesting right atrial pressure of 15 mmHg. FINDINGS  Left Ventricle: Left ventricular ejection fraction, by estimation, is 60 to 65%. The left ventricle has normal function. The left ventricle has no regional wall motion abnormalities. The left ventricular internal cavity size was normal in size. There is  mild asymmetric left ventricular hypertrophy. Left ventricular diastolic parameters are indeterminate. Right Ventricle: The right ventricular size is mildly enlarged. No increase in right ventricular wall thickness. Right ventricular systolic function is mildly reduced. There is severely elevated pulmonary artery systolic pressure. The tricuspid regurgitant velocity is 3.61 m/s, and with an assumed right atrial pressure of 15 mmHg, the estimated right ventricular systolic pressure is 67.1 mmHg. Left Atrium: Left atrial size was moderately dilated. Right Atrium: Right atrial size was severely dilated. Pericardium: There is no evidence of pericardial effusion. Mitral Valve: The mitral valve is normal in structure. Mild mitral annular calcification. Trivial mitral valve regurgitation. No evidence of mitral valve stenosis. Tricuspid Valve: The tricuspid valve is normal in structure. Tricuspid valve regurgitation is moderate . No evidence of tricuspid stenosis. Aortic Valve: The aortic valve is heavily calcified and has very limited mobility. Visually appears to have severe AS but gradients consistent with only mild AS. Would repeat Doppler interrogation or  consider TEE to further evaluate. The aortic valve is tricuspid. There is moderate calcification of the aortic valve. Aortic valve regurgitation is not visualized. Mild to moderate aortic stenosis is present. Aortic valve mean gradient measures 8.0 mmHg. Aortic valve peak gradient measures 13.5 mmHg. Pulmonic Valve: The pulmonic valve was normal in structure. Pulmonic valve regurgitation is trivial. No evidence of pulmonic stenosis. Aorta: The aortic root is normal in size and structure. Venous: The inferior vena cava is dilated in size with less than 50% respiratory variability, suggesting right atrial pressure of 15 mmHg. IAS/Shunts: No atrial level shunt detected by color flow Doppler.  LEFT VENTRICLE PLAX 2D LVIDd:         3.60 cm LVIDs:         2.60 cm LV PW:         0.80 cm LV IVS:        1.30 cm  RIGHT VENTRICLE RV Basal diam:  4.10 cm RV Mid diam:    3.70 cm TAPSE (M-mode): 2.3 cm LEFT ATRIUM             Index        RIGHT ATRIUM           Index LA Vol (A2C):   72.8 ml 41.30 ml/m  RA Area:     27.80 cm LA Vol (A4C):   23.3 ml 13.22 ml/m  RA Volume:   96.00 ml  54.47 ml/m LA Biplane Vol: 43.9 ml 24.91 ml/m  AORTIC VALVE AV Vmax:           184.00 cm/s  AV Vmean:          132.000 cm/s AV VTI:            0.335 m AV Peak Grad:      13.5 mmHg AV Mean Grad:      8.0 mmHg LVOT Vmax:         105.00 cm/s LVOT Vmean:        78.800 cm/s LVOT VTI:          0.184 m LVOT/AV VTI ratio: 0.55 MITRAL VALVE                TRICUSPID VALVE MV Area (PHT): 3.08 cm     TR Peak grad:   52.1 mmHg MV Decel Time: 246 msec     TR Vmax:        361.00 cm/s MV E velocity: 111.00 cm/s                             SHUNTS                             Systemic VTI: 0.18 m Toribio Fuel MD Electronically signed by Toribio Fuel MD Signature Date/Time: 11/06/2023/11:08:53 AM    Final    CT Chest Wo Contrast Result Date: 11/05/2023 CLINICAL DATA:  Shortness of breath. Abnormal chest radiograph with new opacification in volume loss on  the left. Shortness of breath for 3 days. EXAM: CT CHEST WITHOUT CONTRAST TECHNIQUE: Multidetector CT imaging of the chest was performed following the standard protocol without IV contrast. RADIATION DOSE REDUCTION: This exam was performed according to the departmental dose-optimization program which includes automated exposure control, adjustment of the mA and/or kV according to patient size and/or use of iterative reconstruction technique. COMPARISON:  Chest radiograph 11/05/2023.  CT chest 10/05/2023 FINDINGS: Cardiovascular: Cardiac enlargement. Small pericardial effusion. Normal caliber thoracic aorta. Calcification in the aorta and coronary arteries. Probable coronary artery stent. Mediastinum/Nodes: Esophagus is decompressed. No significant lymphadenopathy. Thyroid  gland is unremarkable. Lungs/Pleura: Moderate bilateral pleural effusions. Bilateral basilar atelectasis or consolidation, greater on the left. Air bronchograms are present. Debris demonstrated in the trachea with filling of the lower lobe bronchus. This could be due to aspirated contents, mucous plugging, or an endobronchial obstructing lesion. Follow-up after resolution of acute process is recommended. Patchy airspace infiltrates demonstrated in the right lung base and throughout the aerated left lung. This is likely multifocal pneumonia or aspiration. Diffuse emphysematous changes in the lungs. No pneumothorax. Upper Abdomen: Calcification throughout the pancreas consistent with chronic pancreatitis. An inferior vena caval filter is present. 3 mm stone in the right kidney. No hydronephrosis. Musculoskeletal: Degenerative changes in the spine. No acute bony abnormalities. Old rib fractures. IMPRESSION: 1. Moderate bilateral pleural effusions. 2. Bilateral basilar atelectasis or consolidation, greater on the left. 3. Debris in the trachea with complete filling of the left lower lobe bronchus. 4. Patchy airspace infiltrates in the right lower lung  and throughout the aerated left lung with left central consolidation. 5. Altogether, changes likely represent multifocal pneumonia with compressive atelectasis and consolidation in the lung bases. Aspiration could also have this appearance. A left endobronchial obstructing lesion is not excluded and follow-up after resolution of acute process is recommended. 6. Underlying emphysematous changes in the lungs. 7. Cardiac enlargement with small pericardial effusion. 8. Nonobstructing stone in the right kidney. 9. Pancreatic calcification consistent with chronic pancreatitis. Electronically Signed  By: Elsie Gravely M.D.   On: 11/05/2023 20:35   DG Chest 2 View Result Date: 11/05/2023 CLINICAL DATA:  Shortness of breath for 3 days. EXAM: CHEST - 2 VIEW COMPARISON:  10/05/2023 FINDINGS: There is near complete opacification of the left lung with some residual aeration in the apex. Diffuse volume loss on the left. This is all new since previous study. This likely represents pleural effusion and collapse/consolidation of the left lung. Follow-up to resolution is recommended to exclude a centrally obstructing lesion. Compensatory hyperinflation of the right lung. No pneumothorax. Heart size is obscured but appears mildly enlarged. IMPRESSION: New finding of volume loss and near complete opacification of the left chest. This likely represents pleural effusion with collapse and consolidation in the left lung. Follow-up to resolution is recommended. Electronically Signed   By: Elsie Gravely M.D.   On: 11/05/2023 17:24        Scheduled Meds:  Chlorhexidine  Gluconate Cloth  6 each Topical Nightly   enoxaparin  (LOVENOX ) injection  60 mg Subcutaneous Q12H   furosemide   40 mg Intravenous Q12H   methylPREDNISolone  (SOLU-MEDROL ) injection  10 mg Intravenous Daily   mouth rinse  15 mL Mouth Rinse 4 times per day   pantoprazole  (PROTONIX ) IV  40 mg Intravenous Q12H   sodium chloride  flush  3 mL Intravenous Q12H    Continuous Infusions:  ceFEPime  (MAXIPIME ) IV Stopped (11/06/23 0649)   vancomycin        LOS: 1 day    Time spent: 56 minutes spent on 11/06/2023 caring for this patient face-to-face including chart review, ordering labs/tests, documenting, discussion with nursing staff, consultants, updating family and interview/physical exam    Camellia PARAS [place], DO [facility] Available via Epic secure chat 7am-7pm After these hours, please refer to coverage provider listed on [email or web address] 11/06/2023, 11:42 AM

## 2023-11-06 NOTE — Progress Notes (Signed)
 Pharmacy Antibiotic Note  Arthur [name] is a 83 y.o. male admitted on 11/05/2023 with pneumonia.  PMH signficant for stage IV adenocarcinoma of right lung, recent hospitalization in September for LLL pneumonia.  Pharmacy has been consulted for Vancomycin  and Cefepime  dosing.  Plan: Cefepime  2g IV q12h Vancomycin  1000 mg IV Q 24 hrs. Goal AUC 400-550.  Expected AUC: 541.5  SCr used: 1.01 Follow renal function F/u culture results & sensitivities  Height: 5' 11 (180.3 cm) Weight: 59.5 kg (131 lb 2.8 oz) IBW/kg (Calculated) : 75.3  Temp (24hrs), Avg:98.1 F (36.7 C), Min:97.7 F (36.5 C), Max:98.3 F (36.8 C)  Recent Labs  Lab 11/05/23 1700 11/05/23 1711 11/05/23 1859  WBC 7.0  --   --   CREATININE 1.01  --   --   LATICACIDVEN  --  0.9 0.7    Estimated Creatinine Clearance: 46.6 mL/min (by C-G formula based on SCr of 1.01 mg/dL).    Allergies  Allergen Reactions   Iohexol  Other (See Comments)    Severe ATN after CT with contrast May 2023 - creatinine peaked near 8. Did not require HD.   Ace Inhibitors Cough    Antimicrobials this admission: 10/8 Cefepime  >>   10/8 Vancomycin  >>    Dose adjustments this admission:    Microbiology results: 10/8 BCx:   10/8 MRSA PCR: negative  Thank you for allowing pharmacy to be a part of this patient's care.  Arvin Gauss, PharmD 11/06/2023 1:02 AM

## 2023-11-06 NOTE — Plan of Care (Signed)
  Problem: Nutrition: Goal: Adequate nutrition will be maintained Outcome: Progressing   Problem: Coping: Goal: Level of anxiety will decrease Outcome: Progressing   Problem: Skin Integrity: Goal: Risk for impaired skin integrity will decrease Outcome: Progressing   Problem: Clinical Measurements: Goal: Ability to maintain a body temperature in the normal range will improve Outcome: Progressing   Problem: Respiratory: Goal: Ability to maintain adequate ventilation will improve Outcome: Progressing Goal: Ability to maintain a clear airway will improve Outcome: Progressing   Problem: Activity: Goal: Ability to tolerate increased activity will improve Outcome: Not Progressing

## 2023-11-07 ENCOUNTER — Inpatient Hospital Stay (HOSPITAL_COMMUNITY)

## 2023-11-07 DIAGNOSIS — C3490 Malignant neoplasm of unspecified part of unspecified bronchus or lung: Secondary | ICD-10-CM | POA: Diagnosis not present

## 2023-11-07 DIAGNOSIS — Z515 Encounter for palliative care: Secondary | ICD-10-CM | POA: Diagnosis not present

## 2023-11-07 DIAGNOSIS — R1311 Dysphagia, oral phase: Secondary | ICD-10-CM

## 2023-11-07 DIAGNOSIS — J9621 Acute and chronic respiratory failure with hypoxia: Secondary | ICD-10-CM | POA: Diagnosis not present

## 2023-11-07 DIAGNOSIS — J90 Pleural effusion, not elsewhere classified: Secondary | ICD-10-CM | POA: Diagnosis not present

## 2023-11-07 DIAGNOSIS — R1310 Dysphagia, unspecified: Secondary | ICD-10-CM | POA: Diagnosis not present

## 2023-11-07 DIAGNOSIS — J188 Other pneumonia, unspecified organism: Secondary | ICD-10-CM | POA: Diagnosis not present

## 2023-11-07 DIAGNOSIS — R918 Other nonspecific abnormal finding of lung field: Secondary | ICD-10-CM | POA: Diagnosis not present

## 2023-11-07 LAB — CBC
HCT: 26.5 % — ABNORMAL LOW (ref 39.0–52.0)
Hemoglobin: 8.2 g/dL — ABNORMAL LOW (ref 13.0–17.0)
MCH: 27.7 pg (ref 26.0–34.0)
MCHC: 30.9 g/dL (ref 30.0–36.0)
MCV: 89.5 fL (ref 80.0–100.0)
Platelets: 343 K/uL (ref 150–400)
RBC: 2.96 MIL/uL — ABNORMAL LOW (ref 4.22–5.81)
RDW: 17.7 % — ABNORMAL HIGH (ref 11.5–15.5)
WBC: 10.6 K/uL — ABNORMAL HIGH (ref 4.0–10.5)
nRBC: 0 % (ref 0.0–0.2)

## 2023-11-07 LAB — BASIC METABOLIC PANEL WITH GFR
Anion gap: 8 (ref 5–15)
BUN: 24 mg/dL — ABNORMAL HIGH (ref 8–23)
CO2: 36 mmol/L — ABNORMAL HIGH (ref 22–32)
Calcium: 8.6 mg/dL — ABNORMAL LOW (ref 8.9–10.3)
Chloride: 103 mmol/L (ref 98–111)
Creatinine, Ser: 1.12 mg/dL (ref 0.61–1.24)
GFR, Estimated: >60 mL/min (ref >60)
Glucose, Bld: 87 mg/dL (ref 70–99)
Potassium: 3.9 mmol/L (ref 3.5–5.1)
Sodium: 147 mmol/L — ABNORMAL HIGH (ref 135–145)

## 2023-11-07 LAB — MAGNESIUM: Magnesium: 1.8 mg/dL (ref 1.7–2.4)

## 2023-11-07 LAB — PHOSPHORUS: Phosphorus: 5 mg/dL — ABNORMAL HIGH (ref 2.5–4.6)

## 2023-11-07 NOTE — Progress Notes (Signed)
 Name: Arthur [name] MRN: [id number] DOB: [birth year]    ADMISSION DATE:  11/05/2023  HISTORY OF PRESENT ILLNESS:      83 y.o. male with past medical history significant for  stage IV adenocarcinoma of right lung, CAD s/p DES to LAD (2023), PAF, history of recurrent DVT on Lovenox , history of CVA, who presented with shortness of breath and hypoxia.  Patient is alert and unable to provide information.  Based on the chart, he has started having shortness of breath 4 days ago associated with productive cough and thick sputum.  He normally ambulates with a walker but he has been feeling sick the last few days and not walking much.  He was admitted a month ago for left lower lobe pneumonia.  Patient received CAP coverage.  He was noted to have severe oropharyngeal dysphagia at that time. Pulmonary critical care was consulted due to possible endobronchial mass and bilateral pleural effusion.  PAST MEDICAL HISTORY :   has a past medical history of Diabetes mellitus without complication (HCC), High cholesterol, Hypertension, lung ca, and Normal nuclear stress test (01/29/2008).  has a past surgical history that includes Knee surgery (2005); Cataract extraction, bilateral (2006); LEFT HEART CATH AND CORONARY ANGIOGRAPHY (N/A, 02/06/2021); CORONARY STENT INTERVENTION (N/A, 02/06/2021); Coronary Ultrasound/IVUS (N/A, 02/06/2021); Video bronchoscopy with endobronchial ultrasound (Bilateral, 03/21/2021); Fine needle aspiration (03/21/2021); IR Radiologist Eval & Mgmt (12/24/2021); Esophagogastroduodenoscopy (N/A, 10/17/2022); and biopsy (10/17/2022). Prior to Admission medications   Medication Sig Start Date End Date Taking? Authorizing Provider  acetaminophen  (TYLENOL ) 500 MG tablet Take 1,000 mg by mouth every 8 (eight) hours as needed for mild pain (pain score 1-3).   Yes [provider]  alectinib (ALECENSA ) 150 MG capsule Take 3 capsules (450 mg total) by mouth 2 (two) times daily with a meal. 10/29/23   Yes Sherrod Sherrod, MD  B Complex Vitamins (VITAMIN B COMPLEX) TABS Take 1 tablet by mouth daily.   Yes [provider]  baclofen  (LIORESAL ) 10 MG tablet Take 1 tablet (10 mg total) by mouth 2 (two) times daily as needed for muscle spasms. 01/31/23  Yes Chambliss, Layman CROME, MD  benzonatate  (TESSALON ) 200 MG capsule Take 200 mg by mouth in the morning, at noon, and at bedtime.   Yes [provider]  clopidogrel  (PLAVIX ) 75 MG tablet Take 1 tablet (75 mg total) by mouth daily with breakfast. 09/09/23  Yes Rosendo Rush, MD  diclofenac  Sodium (VOLTAREN ) 1 % GEL Apply 2 g topically as needed (Left shoulder pan). 06/15/20  Yes [provider]  enoxaparin  (LOVENOX ) 80 MG/0.8ML injection Inject 0.7 mLs into the skin every 12 (twelve) hours.   Yes [provider]  lidocaine  4 % Place 1 patch onto the skin daily as needed (Left arm pain).   Yes [provider]  LORazepam (ATIVAN) 0.5 MG tablet Take 0.25 mg by mouth 2 (two) times daily. 11/05/23  Yes [provider]  oxyCODONE  (OXY IR/ROXICODONE ) 5 MG immediate release tablet Take 0.5-1 tablets (2.5-5 mg total) by mouth every 6 (six) hours as needed for moderate pain (pain score 4-6). Patient taking differently: Take 5 mg by mouth every 6 (six) hours as needed for moderate pain (pain score 4-6). 10/10/23  Yes Mabe, Elna, MD  pantoprazole  (PROTONIX ) 40 MG tablet Take 1 tablet (40 mg total) by mouth 2 (two) times daily. 09/23/23  Yes [name], [name], DO  polyethylene glycol (MIRALAX ) 17 g packet Take 17 g by mouth daily. 10/09/23  Yes Janna Ferrier, DO  predniSONE  (DELTASONE ) 10 MG tablet Take 1 tablet (10 mg total) by mouth daily with breakfast. 08/19/23  Yes Heilingoetter, Cassandra L, PA-C  rosuvastatin  (CRESTOR ) 5 MG tablet Take 1 tablet by mouth once daily Patient taking differently: Take 5 mg by mouth at bedtime. 05/19/23  Yes Gomes, Adriana, DO  senna (SENOKOT) 8.6 MG TABS tablet Take 1 tablet (8.6 mg  total) by mouth daily. 10/09/23  Yes Janna Ferrier, DO  sodium chloride  (OCEAN) 0.65 % SOLN nasal spray Place 1 spray into both nostrils as needed for congestion. 09/06/22  Yes Howell Lunger, DO  busPIRone  (BUSPAR ) 5 MG tablet Take 1 tablet (5 mg total) by mouth 2 (two) times daily. Patient not taking: Reported on 11/05/2023 09/09/23   Rosendo Rush, MD  Iron -FA-B Cmp-C-Biot-Probiotic (FUSION PLUS) CAPS Take 1 capsule by mouth once daily 11/04/23   [name], [name], DO  Menthol -Methyl Salicylate  (MUSCLE RUB) 10-15 % CREA Apply 1 Application topically as needed for muscle pain. Patient not taking: Reported on 11/05/2023 10/08/23   Gomes, Adriana, DO   Allergies  Allergen Reactions   Iohexol  Other (See Comments)    Severe ATN after CT with contrast May 2023 - creatinine peaked near 8. Did not require HD.   Ace Inhibitors Cough    FAMILY HISTORY:  family history includes Coronary artery disease in his father and mother. SOCIAL HISTORY:  reports that he has never smoked. He has never used smokeless tobacco. He reports that he does not drink alcohol and does not use drugs.  REVIEW OF SYSTEMS:   Constitutional: Negative for fever, chills, weight loss, malaise/fatigue and diaphoresis.  HENT: Negative for hearing loss, ear pain, nosebleeds, congestion, sore throat, neck pain, tinnitus and ear discharge.   Eyes: Negative for blurred vision, double vision, photophobia, pain, discharge and redness.  Respiratory: Negative for cough, hemoptysis, sputum production, shortness of breath, wheezing and stridor.   Cardiovascular: Negative for chest pain, palpitations, orthopnea, claudication, leg swelling and PND.  Gastrointestinal: Negative for heartburn, nausea, vomiting, abdominal pain, diarrhea, constipation, blood in stool and melena.  Genitourinary: Negative for dysuria, urgency, frequency, hematuria and flank pain.  Musculoskeletal: Negative for myalgias, back pain, joint pain and falls.  Skin: Negative  for itching and rash.  Neurological: Negative for dizziness, tingling, tremors, sensory change, speech change, focal weakness, seizures, loss of consciousness, weakness and headaches.  Endo/Heme/Allergies: Negative for environmental allergies and polydipsia. Does not bruise/bleed easily.  SUBJECTIVE:   VITAL SIGNS: Temp:  [97.3 F (36.3 C)-97.8 F (36.6 C)] 97.5 F (36.4 C) (10/10 0800) Pulse Rate:  [87-102] 90 (10/10 0900) Resp:  [15-34] 23 (10/10 1000) BP: (106-131)/(35-56) 123/52 (10/10 1000) SpO2:  [88 %-100 %] 88 % (10/10 0900) Weight:  [58.2 kg] 58.2 kg (10/10 0500)  Physical Exam Constitutional:      Appearance: He is ill-appearing.     Comments: Moaning, and respond to questions nodding.  Cardiovascular:     Rate and Rhythm: Normal rate and regular rhythm.  Pulmonary:     Comments: Mild respiratory distress, on 3 L nasal cannula.  Decreased breath sounds bilaterally L>R. Abdominal:     Palpations: Abdomen is soft.  Skin:    General: Skin is warm and dry.  Neurological:     Mental Status: He is able to nod for questions.    Recent Labs  Lab 11/05/23 1700 11/06/23 0319 11/07/23 0341  NA 143 144 147*  K 4.7 4.2 3.9  CL 103 104 103  CO2 33* 31 36*  BUN 16  18 24*  CREATININE 1.01 1.00 1.12  GLUCOSE 153* 131* 87   Recent Labs  Lab 11/05/23 1700 11/06/23 0319 11/07/23 0341  HGB 8.8* 8.2* 8.2*  HCT 28.0* 27.2* 26.5*  WBC 7.0 10.0 10.6*  PLT 362 331 343   DG CHEST PORT 1 VIEW Result Date: 11/07/2023 CLINICAL DATA:  Dyspnea. EXAM: PORTABLE CHEST 1 VIEW COMPARISON:  11/05/2023 FINDINGS: Normal-sized heart. Extensive left lung airspace opacity with improvement. Continued dense consolidation in the left lower lobe. Interval patchy density in the right lower lung zone. Probable small bilateral pleural effusions. Thoracic spine degenerative changes. IMPRESSION: 1. Improved left lung pneumonia/atelectasis. 2. Interval right lower lung zone pneumonia or alveolar  edema. 3. Probable small bilateral pleural effusions. Electronically Signed   By: Elspeth Bathe M.D.   On: 11/07/2023 08:31   ECHOCARDIOGRAM COMPLETE Result Date: 11/06/2023    ECHOCARDIOGRAM REPORT   Patient Name:   DEOVION BATREZ [facility] Date of Exam: 11/06/2023 Medical Rec #:  [id number]         Height:       71.0 in Accession #:    [id number]        Weight:       131.2 lb Date of Birth:  [birth year]          BSA:          1.763 m Patient Age:    83 years          BP:           123/45 mmHg Patient Gender: M                 HR:           87 bpm. Exam Location:  Inpatient Procedure: 2D Echo, Cardiac Doppler and Color Doppler (Both Spectral and Color            Flow Doppler were utilized during procedure). Indications:    Dyspnea R06.00  History:        Patient has prior history of Echocardiogram examinations, most                 recent 06/21/2021. Risk Factors:Hypertension and Diabetes.  Sonographer:    Jayson Gaskins Referring Phys: [id number] ERIC J [place] IMPRESSIONS  1. Left ventricular ejection fraction, by estimation, is 60 to 65%. The left ventricle has normal function. The left ventricle has no regional wall motion abnormalities. There is mild asymmetric left ventricular hypertrophy. Left ventricular diastolic parameters are indeterminate.  2. Right ventricular systolic function is mildly reduced. The right ventricular size is mildly enlarged. There is severely elevated pulmonary artery systolic pressure. The estimated right ventricular systolic pressure is 67.1 mmHg.  3. Left atrial size was moderately dilated.  4. Right atrial size was severely dilated.  5. The mitral valve is normal in structure. Trivial mitral valve regurgitation. No evidence of mitral stenosis.  6. Tricuspid valve regurgitation is moderate.  7. The aortic valve is heavily calcified and has very limited mobility. Visually appears to have severe AS but gradients consistent with only mild AS. Would repeat Doppler interrogation or consider TEE to  further evaluate. The aortic valve is tricuspid.  There is moderate calcification of the aortic valve. Aortic valve regurgitation is not visualized. Mild to moderate aortic valve stenosis. Aortic valve mean gradient measures 8.0 mmHg. Aortic valve Vmax measures 1.84 m/s.  8. The inferior vena cava is dilated in size with <50% respiratory variability, suggesting right atrial pressure of 15 mmHg. FINDINGS  Left Ventricle: Left ventricular ejection fraction, by estimation, is 60 to 65%. The left ventricle has normal function. The left ventricle has no regional wall motion abnormalities. The left ventricular internal cavity size was normal in size. There is  mild asymmetric left ventricular hypertrophy. Left ventricular diastolic parameters are indeterminate. Right Ventricle: The right ventricular size is mildly enlarged. No increase in right ventricular wall thickness. Right ventricular systolic function is mildly reduced. There is severely elevated pulmonary artery systolic pressure. The tricuspid regurgitant velocity is 3.61 m/s, and with an assumed right atrial pressure of 15 mmHg, the estimated right ventricular systolic pressure is 67.1 mmHg. Left Atrium: Left atrial size was moderately dilated. Right Atrium: Right atrial size was severely dilated. Pericardium: There is no evidence of pericardial effusion. Mitral Valve: The mitral valve is normal in structure. Mild mitral annular calcification. Trivial mitral valve regurgitation. No evidence of mitral valve stenosis. Tricuspid Valve: The tricuspid valve is normal in structure. Tricuspid valve regurgitation is moderate . No evidence of tricuspid stenosis. Aortic Valve: The aortic valve is heavily calcified and has very limited mobility. Visually appears to have severe AS but gradients consistent with only mild AS. Would repeat Doppler interrogation or consider TEE to further evaluate. The aortic valve is tricuspid. There is moderate calcification of the aortic  valve. Aortic valve regurgitation is not visualized. Mild to moderate aortic stenosis is present. Aortic valve mean gradient measures 8.0 mmHg. Aortic valve peak gradient measures 13.5 mmHg. Pulmonic Valve: The pulmonic valve was normal in structure. Pulmonic valve regurgitation is trivial. No evidence of pulmonic stenosis. Aorta: The aortic root is normal in size and structure. Venous: The inferior vena cava is dilated in size with less than 50% respiratory variability, suggesting right atrial pressure of 15 mmHg. IAS/Shunts: No atrial level shunt detected by color flow Doppler.  LEFT VENTRICLE PLAX 2D LVIDd:         3.60 cm LVIDs:         2.60 cm LV PW:         0.80 cm LV IVS:        1.30 cm  RIGHT VENTRICLE RV Basal diam:  4.10 cm RV Mid diam:    3.70 cm TAPSE (M-mode): 2.3 cm LEFT ATRIUM             Index        RIGHT ATRIUM           Index LA Vol (A2C):   72.8 ml 41.30 ml/m  RA Area:     27.80 cm LA Vol (A4C):   23.3 ml 13.22 ml/m  RA Volume:   96.00 ml  54.47 ml/m LA Biplane Vol: 43.9 ml 24.91 ml/m  AORTIC VALVE AV Vmax:           184.00 cm/s AV Vmean:          132.000 cm/s AV VTI:            0.335 m AV Peak Grad:      13.5 mmHg AV Mean Grad:      8.0 mmHg LVOT Vmax:         105.00 cm/s LVOT Vmean:        78.800 cm/s LVOT VTI:          0.184 m LVOT/AV VTI ratio: 0.55 MITRAL VALVE                TRICUSPID VALVE MV Area (PHT): 3.08 cm     TR Peak grad:  52.1 mmHg MV Decel Time: 246 msec     TR Vmax:        361.00 cm/s MV E velocity: 111.00 cm/s                             SHUNTS                             Systemic VTI: 0.18 m Toribio Fuel MD Electronically signed by Toribio Fuel MD Signature Date/Time: 11/06/2023/11:08:53 AM    Final    CT Chest Wo Contrast Result Date: 11/05/2023 CLINICAL DATA:  Shortness of breath. Abnormal chest radiograph with new opacification in volume loss on the left. Shortness of breath for 3 days. EXAM: CT CHEST WITHOUT CONTRAST TECHNIQUE: Multidetector CT imaging of  the chest was performed following the standard protocol without IV contrast. RADIATION DOSE REDUCTION: This exam was performed according to the departmental dose-optimization program which includes automated exposure control, adjustment of the mA and/or kV according to patient size and/or use of iterative reconstruction technique. COMPARISON:  Chest radiograph 11/05/2023.  CT chest 10/05/2023 FINDINGS: Cardiovascular: Cardiac enlargement. Small pericardial effusion. Normal caliber thoracic aorta. Calcification in the aorta and coronary arteries. Probable coronary artery stent. Mediastinum/Nodes: Esophagus is decompressed. No significant lymphadenopathy. Thyroid  gland is unremarkable. Lungs/Pleura: Moderate bilateral pleural effusions. Bilateral basilar atelectasis or consolidation, greater on the left. Air bronchograms are present. Debris demonstrated in the trachea with filling of the lower lobe bronchus. This could be due to aspirated contents, mucous plugging, or an endobronchial obstructing lesion. Follow-up after resolution of acute process is recommended. Patchy airspace infiltrates demonstrated in the right lung base and throughout the aerated left lung. This is likely multifocal pneumonia or aspiration. Diffuse emphysematous changes in the lungs. No pneumothorax. Upper Abdomen: Calcification throughout the pancreas consistent with chronic pancreatitis. An inferior vena caval filter is present. 3 mm stone in the right kidney. No hydronephrosis. Musculoskeletal: Degenerative changes in the spine. No acute bony abnormalities. Old rib fractures. IMPRESSION: 1. Moderate bilateral pleural effusions. 2. Bilateral basilar atelectasis or consolidation, greater on the left. 3. Debris in the trachea with complete filling of the left lower lobe bronchus. 4. Patchy airspace infiltrates in the right lower lung and throughout the aerated left lung with left central consolidation. 5. Altogether, changes likely represent  multifocal pneumonia with compressive atelectasis and consolidation in the lung bases. Aspiration could also have this appearance. A left endobronchial obstructing lesion is not excluded and follow-up after resolution of acute process is recommended. 6. Underlying emphysematous changes in the lungs. 7. Cardiac enlargement with small pericardial effusion. 8. Nonobstructing stone in the right kidney. 9. Pancreatic calcification consistent with chronic pancreatitis. Electronically Signed   By: Elsie Gravely M.D.   On: 11/05/2023 20:35   DG Chest 2 View Result Date: 11/05/2023 CLINICAL DATA:  Shortness of breath for 3 days. EXAM: CHEST - 2 VIEW COMPARISON:  10/05/2023 FINDINGS: There is near complete opacification of the left lung with some residual aeration in the apex. Diffuse volume loss on the left. This is all new since previous study. This likely represents pleural effusion and collapse/consolidation of the left lung. Follow-up to resolution is recommended to exclude a centrally obstructing lesion. Compensatory hyperinflation of the right lung. No pneumothorax. Heart size is obscured but appears mildly enlarged. IMPRESSION: New finding of volume loss and near complete opacification of the left chest. This likely represents  pleural effusion with collapse and consolidation in the left lung. Follow-up to resolution is recommended. Electronically Signed   By: Elsie Gravely M.D.   On: 11/05/2023 17:24    ASSESSMENT / PLAN: LLL pneumonia vs aspiration Severe dysphagia Bilateral pleural effusions stage IV adenocarcinoma of the lung   83 years old with history of stage IV adenocarcinoma of the lung, who had a month ago and admission for left lower lobe pneumonia. He is admitted for shortness of breath and hypoxia.  CT scan shows left lower lobe consolidations with bronchogram.  He has bilateral pleural effusions.  There is debris on the trachea and left lower lobe bronchus, that he has been also present  in the last CT scan a month ago.  I think patient most likely had an aspiration event, likely in the setting of severe dysphagia, evaluated in the past.  Initially patient had 12 L of oxygen and currently he is on 3 L nasal cannula. CXR today showed improvement of the LLL pneumonia.No indication for bronchoscopy or thoracentesis at this time.   -Continue antibiotics -Lasix , and diuresis -1L -SLP evaluation.  I will sign off for now, if there is any other questions, please don't hesitate to reach out.  Marny Patch, MD Pulmonary and Critical Care Medicine [facility] Pager: [phone]  11/07/2023, 11:15 AM

## 2023-11-07 NOTE — Progress Notes (Signed)
 PROGRESS NOTE    Arthur [name]  FMW:[id number] DOB: 02/24/1940 DOA: 11/05/2023 PCP: Arthur Blizzard, DO    Brief Narrative:   Arthur [name] is a 83 y.o. male with past medical history significant for  stage IV adenocarcinoma of right lung, CAD s/p DES to LAD (2023), PAF, history of recurrent DVT on Lovenox , history of CVA, chronic hemolytic anemia on prednisone  10 mg daily, HTN, HLD who presented to [facility] ED on 11/05/2023 from [facility] for evaluation of shortness of breath and hypoxia. Patient reports developing shortness of breath about 4 days ago.  He has had associated cough productive of thick white sputum.  He says he normally ambulates with use of a walker but has felt so sick the last few days he has not been walking much at all.   Patient was recently admitted 9/7-9/11 for left lower lobe pneumonia.  Patient was treated with IV ceftriaxone  and azithromycin  while in hospital.  Patient was noted to have severe oropharyngeal dysphagia on swallow study.  He was also noted to have waxing and waning mentation.  EEG noted moderate diffuse encephalopathy.  Patient was discharged to SNF.   In the ED, BP 135/59, pulse 106, RR 29, temp 98.3 F, SpO2 100% on 4 L O2 via [place].  SpO2 dropped to 78% while on 6 L.  Patient was transitioned to NRB, then BiPAP with improvement in SpO2. WBC 7.0, hemoglobin 8.8, platelets 362, sodium 143, potassium 4.7, bicarb 33, BUN 16, creatinine 1.01, serum glucose 153, proBNP 3136, troponin T 56 > 53, lactic acid 0.7. Blood cultures ordered and UA pending collection. CXR with volume loss and near complete opacification of the left chest likely representing pleural effusion with collapse and consolidation in the left lung.   CT chest without contrast: Moderate bilateral pleural effusions, bilateral basilar atelectasis or consolidation, greater on the left, debris in the trachea with complete filling of the left lower lobe bronchus, patchy airspace  infiltrates in the right lower lung and throughout the aerated left lung with left central consolidation c/w multifocal pneumonia with compressive atelectasis and consolidation in the lung bases. Aspiration could also have this appearance. A left endobronchial obstructing lesion is not excluded and follow-up after resolution of acute process is recommended. Underlying emphysematous changes in the lungs. Cardiac enlargement with small pericardial effusion. Nonobstructing stone in the right kidney. Pancreatic calcification consistent with chronic pancreatitis.   Patient was given IV vancomycin  and cefepime .  EDP discussed with PCCM (Arthur [name]), their team will see in consultation in the morning and are available overnight if needed.  The hospitalist service was consulted for admission.  Assessment & Plan:   Acute hypoxic respiratory failure due to multifocal pneumonia Bilateral pleural effusions: Recent admission for LLL PNA now admitted with hypoxic respiratory failure due to multifocal pneumonia with bilateral pleural effusions and bibasilar atelectasis.  Aspiration risk is high and potentially contributing.  Left endobronchial obstructing lesion not excluded.  Strep pneumo urine antigen negative. -- PCCM following, appreciate assistance --TTE with LVEF 60 to 65%, biatrial enlargement, moderate TR, trivial MR, noted AAS, IVC dilated -- Blood Cultures x 2: no growth x 2 days -- Legionella urine antigen: Pending -- Lasix  40 mg IV every 12 hours -- Cefepime  2 g IV every 12 hours -- Continue supplemental O2, wean down to 3 L nasal cannula this morning -- BiPAP available as needed -- Strict I's and O's and daily weights  -- Aspiration precautions  Dysphagia -- Seen by SLP  10/9, recommend few 1/2 tsp of water  and 1/4 tsp [name] ice for comfort ONLY; otherwise n.p.o. -- Aspiration precaution   Paroxysmal atrial fibrillation: Remains in atrial fibrillation, HR mostly <110.  Not on rate or rhythm  controlling agents as an outpatient.  -- Continue Lovenox  anticoagulation.   History of recurrent DVT: -- Continue Lovenox .   Chronic hemolytic anemia: Hemoglobin stable at 8.8.   -- Solu-Medrol  10 mg IV every 24 hours (substituted for home prednisone  10 mg 2/2 dysphagia) Continue home prednisone  10 mg daily.   History of CVA: -- Continue Lovenox  -- Plavix /statin on hold due to dysphagia, n.p.o. status   CAD s/p DES to LAD 2023: -- Plavix  and rosuvastatin  on hold due to n.p.o. status   Hypertension: Does not appear to be on antihypertensives as an outpatient.  BP is currently stable.  Continue to monitor.   Stage IV right lung adenocarcinoma: Follows with oncology, Arthur [name].  Current therapy is with alectinib, will hold for now.  Pressure injury, stage II right ankle POA Wound 11/06/23 0100 Pressure Injury Ankle Right;Lateral Stage 2 -  Partial thickness loss of dermis presenting as a shallow open injury with a red, pink wound bed without slough. (Active)  -- Seen by wound RN -- Prevalon boots bilaterally to offload heels; high risk for breakdown -- Single layer of xeroform to the toe tips and gauze. Change every other day -- Foam the the right heel; change every three days.   Adult failure to thrive Patient with recurrent hospitalizations with pneumonia likely complicated by recurrent aspiration events.  Patient appears cachectic with adult failure to thrive.  Discussed with patient's son at bedside on 10/9, will update CODE STATUS to reflect DNR/DNI given that he would have a poor outcome during the resuscitative event.  Patient with continued poor tolerance of any oral intake.  Discussed with multiple family members present at bedside as well as patient.  No other further options and patient does not wish for any type of feeding tube.  Awaiting for additional sons for return from [place]  with anticipation of residential hospice placement.  Overall poor prognosis given his  comorbidities advanced metastatic lung cancer and adult failure to thrive.   DVT prophylaxis: Lovenox     Code Status: Limited: Do not attempt resuscitation (DNR) -DNR-LIMITED -Do Not Intubate/DNI  Family Communication: Updated son present at bedside this morning  Disposition Plan:  Level of care: Stepdown Status is: Inpatient Remains inpatient appropriate because: IV antibiotics, IV diuresis, weaning of supplemental oxygen, therapy evaluation, palliative care consultation, overall prognosis poor; anticipate residential hospice placement.    Consultants:  PCCM Palliative care  Procedures:  None  Antimicrobials:  Vancomycin  10/8>> Cefepime  10/8>>   Subjective: Patient seen examined bedside, lying in bed.  Alert.  Multiple family members present.  Attempted small amounts of oral intake this morning, unable to tolerate with immediate coughing from patient.  Discussed with patient and multiple family members no further viable options given recurrence of events likely need to transition to a more comfort approach.  Awaiting additional son from [place]  to arrive tomorrow.  Discussed anticipation of residential hospice placement, family seems agreeable.  Chest x-ray improved, respiratory status also improved. No other specific complaints or concerns at this time.  Patient denies headache, no chest pain, no abdominal pain, no fever.  Discussed with RN at bedside this morning.  Also discussed with PCCM and palliative care this morning.  Objective: Vitals:   11/07/23 0945 11/07/23 1000 11/07/23 1100 11/07/23 1142  BP:  (!) 123/52 (!) 124/52   Pulse:      Resp: (!) 25 (!) 23 (!) 37   Temp:    (!) 97.5 F (36.4 C)  TempSrc:    Oral  SpO2:      Weight:      Height:        Intake/Output Summary (Last 24 hours) at 11/07/2023 1149 Last data filed at 11/07/2023 1040 Gross per 24 hour  Intake 192.71 ml  Output 3000 ml  Net -2807.29 ml   Filed Weights   11/06/23 0000 11/07/23 0500   Weight: 59.5 kg 58.2 kg    Examination:  Physical Exam: GEN: NAD, somnolent, but arousable, chronically cachectic/ill in appearance HEENT: NCAT, PERRL, sclera clear, dry mucous membranes PULM: Poor to little air movement left hemithorax, right hemithorax with crackles, no wheezing, normal respiratory effort without accessory muscle use, on 3 L nasal cannula with SpO2 100% at rest CV: RRR w/o M/G/R GI: abd soft, NTND, + BS MSK: no peripheral edema Integumentary: Right ankle wound as depicted below    Data Reviewed: I have personally reviewed following labs and imaging studies  CBC: Recent Labs  Lab 11/05/23 1700 11/06/23 0319 11/07/23 0341  WBC 7.0 10.0 10.6*  NEUTROABS 6.0  --   --   HGB 8.8* 8.2* 8.2*  HCT 28.0* 27.2* 26.5*  MCV 90.0 90.7 89.5  PLT 362 331 343   Basic Metabolic Panel: Recent Labs  Lab 11/05/23 1700 11/06/23 0319 11/07/23 0341  NA 143 144 147*  K 4.7 4.2 3.9  CL 103 104 103  CO2 33* 31 36*  GLUCOSE 153* 131* 87  BUN 16 18 24*  CREATININE 1.01 1.00 1.12  CALCIUM  8.7* 8.5* 8.6*  MG  --   --  1.8  PHOS  --   --  5.0*   GFR: Estimated Creatinine Clearance: 41.1 mL/min (by C-G formula based on SCr of 1.12 mg/dL). Liver Function Tests: Recent Labs  Lab 11/05/23 1700  AST 20  ALT 13  ALKPHOS 75  BILITOT 1.3*  PROT 5.3*  ALBUMIN  2.8*   No results for input(s): LIPASE, AMYLASE in the last 168 hours. No results for input(s): AMMONIA in the last 168 hours. Coagulation Profile: Recent Labs  Lab 11/05/23 1700  INR 1.1   Cardiac Enzymes: No results for input(s): CKTOTAL, CKMB, CKMBINDEX, TROPONINI in the last 168 hours. BNP (last 3 results) Recent Labs    11/05/23 1700  PROBNP 3,136.0*   HbA1C: No results for input(s): HGBA1C in the last 72 hours. CBG: Recent Labs  Lab 11/06/23 0821  GLUCAP 103*   Lipid Profile: No results for input(s): CHOL, HDL, LDLCALC, TRIG, CHOLHDL, LDLDIRECT in the last 72  hours. Thyroid  Function Tests: No results for input(s): TSH, T4TOTAL, FREET4, T3FREE, THYROIDAB in the last 72 hours. Anemia Panel: No results for input(s): VITAMINB12, FOLATE, FERRITIN, TIBC, IRON , RETICCTPCT in the last 72 hours. Sepsis Labs: Recent Labs  Lab 11/05/23 1711 11/05/23 1859  LATICACIDVEN 0.9 0.7    Recent Results (from the past 240 hours)  Culture, blood (Routine x 2)     Status: None (Preliminary result)   Collection Time: 11/05/23  5:03 PM   Specimen: BLOOD RIGHT ARM  Result Value Ref Range Status   Specimen Description   Final    BLOOD RIGHT ARM Performed at [facility], [address] [place], [place] [address]    Special Requests   Final    BOTTLES DRAWN AEROBIC AND  ANAEROBIC Blood Culture adequate volume Performed at [facility], [address], [place], [place] [address]    Culture   Final    NO GROWTH 2 DAYS Performed at [facility], [address], [place], [place] [address]    Report Status PENDING  Incomplete  Culture, blood (Routine x 2)     Status: None (Preliminary result)   Collection Time: 11/05/23  5:52 PM   Specimen: BLOOD  Result Value Ref Range Status   Specimen Description   Final    BLOOD LEFT ANTECUBITAL Performed at [facility], [address], [place], [place] [address]    Special Requests   Final    BOTTLES DRAWN AEROBIC AND ANAEROBIC Blood Culture results may not be optimal due to an inadequate volume of blood received in culture bottles Performed at [facility], [address], [place], [place] [address]    Culture   Final    NO GROWTH 2 DAYS Performed at [facility], [address], [place], [place] [address]    Report Status PENDING  Incomplete  MRSA Next Gen by PCR, Nasal     Status: None   Collection Time: 11/05/23 10:00 PM   Specimen: Nasal Mucosa; Nasal Swab  Result Value Ref Range Status   MRSA by PCR Next Gen NOT  DETECTED NOT DETECTED Final    Comment: (NOTE) The GeneXpert MRSA Assay (FDA approved for NASAL specimens only), is one component of a comprehensive MRSA colonization surveillance program. It is not intended to diagnose MRSA infection nor to guide or monitor treatment for MRSA infections. Test performance is not FDA approved in patients less than 22 years old. Performed at [facility], [address] [place], [place] [address]          Radiology Studies: DG CHEST PORT 1 VIEW Result Date: 11/07/2023 CLINICAL DATA:  Dyspnea. EXAM: PORTABLE CHEST 1 VIEW COMPARISON:  11/05/2023 FINDINGS: Normal-sized heart. Extensive left lung airspace opacity with improvement. Continued dense consolidation in the left lower lobe. Interval patchy density in the right lower lung zone. Probable small bilateral pleural effusions. Thoracic spine degenerative changes. IMPRESSION: 1. Improved left lung pneumonia/atelectasis. 2. Interval right lower lung zone pneumonia or alveolar edema. 3. Probable small bilateral pleural effusions. Electronically Signed   By: Elspeth Bathe M.D.   On: 11/07/2023 08:31   ECHOCARDIOGRAM COMPLETE Result Date: 11/06/2023    ECHOCARDIOGRAM REPORT   Patient Name:   SHER HELLINGER [facility] Date of Exam: 11/06/2023 Medical Rec #:  [id number]         Height:       71.0 in Accession #:    [id number]        Weight:       131.2 lb Date of Birth:  [birth year]          BSA:          1.763 m Patient Age:    83 years          BP:           123/45 mmHg Patient Gender: M                 HR:           87 bpm. Exam Location:  Inpatient Procedure: 2D Echo, Cardiac Doppler and Color Doppler (Both Spectral and Color            Flow Doppler were utilized during procedure). Indications:  Dyspnea R06.00  History:        Patient has prior history of Echocardiogram examinations, most                 recent 06/21/2021. Risk Factors:Hypertension and Diabetes.  Sonographer:    Jayson Gaskins Referring Phys:  [id number] Saphire Barnhart J [place] IMPRESSIONS  1. Left ventricular ejection fraction, by estimation, is 60 to 65%. The left ventricle has normal function. The left ventricle has no regional wall motion abnormalities. There is mild asymmetric left ventricular hypertrophy. Left ventricular diastolic parameters are indeterminate.  2. Right ventricular systolic function is mildly reduced. The right ventricular size is mildly enlarged. There is severely elevated pulmonary artery systolic pressure. The estimated right ventricular systolic pressure is 67.1 mmHg.  3. Left atrial size was moderately dilated.  4. Right atrial size was severely dilated.  5. The mitral valve is normal in structure. Trivial mitral valve regurgitation. No evidence of mitral stenosis.  6. Tricuspid valve regurgitation is moderate.  7. The aortic valve is heavily calcified and has very limited mobility. Visually appears to have severe AS but gradients consistent with only mild AS. Would repeat Doppler interrogation or consider TEE to further evaluate. The aortic valve is tricuspid.  There is moderate calcification of the aortic valve. Aortic valve regurgitation is not visualized. Mild to moderate aortic valve stenosis. Aortic valve mean gradient measures 8.0 mmHg. Aortic valve Vmax measures 1.84 m/s.  8. The inferior vena cava is dilated in size with <50% respiratory variability, suggesting right atrial pressure of 15 mmHg. FINDINGS  Left Ventricle: Left ventricular ejection fraction, by estimation, is 60 to 65%. The left ventricle has normal function. The left ventricle has no regional wall motion abnormalities. The left ventricular internal cavity size was normal in size. There is  mild asymmetric left ventricular hypertrophy. Left ventricular diastolic parameters are indeterminate. Right Ventricle: The right ventricular size is mildly enlarged. No increase in right ventricular wall thickness. Right ventricular systolic function is mildly reduced. There is  severely elevated pulmonary artery systolic pressure. The tricuspid regurgitant velocity is 3.61 m/s, and with an assumed right atrial pressure of 15 mmHg, the estimated right ventricular systolic pressure is 67.1 mmHg. Left Atrium: Left atrial size was moderately dilated. Right Atrium: Right atrial size was severely dilated. Pericardium: There is no evidence of pericardial effusion. Mitral Valve: The mitral valve is normal in structure. Mild mitral annular calcification. Trivial mitral valve regurgitation. No evidence of mitral valve stenosis. Tricuspid Valve: The tricuspid valve is normal in structure. Tricuspid valve regurgitation is moderate . No evidence of tricuspid stenosis. Aortic Valve: The aortic valve is heavily calcified and has very limited mobility. Visually appears to have severe AS but gradients consistent with only mild AS. Would repeat Doppler interrogation or consider TEE to further evaluate. The aortic valve is tricuspid. There is moderate calcification of the aortic valve. Aortic valve regurgitation is not visualized. Mild to moderate aortic stenosis is present. Aortic valve mean gradient measures 8.0 mmHg. Aortic valve peak gradient measures 13.5 mmHg. Pulmonic Valve: The pulmonic valve was normal in structure. Pulmonic valve regurgitation is trivial. No evidence of pulmonic stenosis. Aorta: The aortic root is normal in size and structure. Venous: The inferior vena cava is dilated in size with less than 50% respiratory variability, suggesting right atrial pressure of 15 mmHg. IAS/Shunts: No atrial level shunt detected by color flow Doppler.  LEFT VENTRICLE PLAX 2D LVIDd:         3.60 cm LVIDs:  2.60 cm LV PW:         0.80 cm LV IVS:        1.30 cm  RIGHT VENTRICLE RV Basal diam:  4.10 cm RV Mid diam:    3.70 cm TAPSE (M-mode): 2.3 cm LEFT ATRIUM             Index        RIGHT ATRIUM           Index LA Vol (A2C):   72.8 ml 41.30 ml/m  RA Area:     27.80 cm LA Vol (A4C):   23.3 ml 13.22  ml/m  RA Volume:   96.00 ml  54.47 ml/m LA Biplane Vol: 43.9 ml 24.91 ml/m  AORTIC VALVE AV Vmax:           184.00 cm/s AV Vmean:          132.000 cm/s AV VTI:            0.335 m AV Peak Grad:      13.5 mmHg AV Mean Grad:      8.0 mmHg LVOT Vmax:         105.00 cm/s LVOT Vmean:        78.800 cm/s LVOT VTI:          0.184 m LVOT/AV VTI ratio: 0.55 MITRAL VALVE                TRICUSPID VALVE MV Area (PHT): 3.08 cm     TR Peak grad:   52.1 mmHg MV Decel Time: 246 msec     TR Vmax:        361.00 cm/s MV E velocity: 111.00 cm/s                             SHUNTS                             Systemic VTI: 0.18 m Toribio Fuel MD Electronically signed by Toribio Fuel MD Signature Date/Time: 11/06/2023/11:08:53 AM    Final    CT Chest Wo Contrast Result Date: 11/05/2023 CLINICAL DATA:  Shortness of breath. Abnormal chest radiograph with new opacification in volume loss on the left. Shortness of breath for 3 days. EXAM: CT CHEST WITHOUT CONTRAST TECHNIQUE: Multidetector CT imaging of the chest was performed following the standard protocol without IV contrast. RADIATION DOSE REDUCTION: This exam was performed according to the departmental dose-optimization program which includes automated exposure control, adjustment of the mA and/or kV according to patient size and/or use of iterative reconstruction technique. COMPARISON:  Chest radiograph 11/05/2023.  CT chest 10/05/2023 FINDINGS: Cardiovascular: Cardiac enlargement. Small pericardial effusion. Normal caliber thoracic aorta. Calcification in the aorta and coronary arteries. Probable coronary artery stent. Mediastinum/Nodes: Esophagus is decompressed. No significant lymphadenopathy. Thyroid  gland is unremarkable. Lungs/Pleura: Moderate bilateral pleural effusions. Bilateral basilar atelectasis or consolidation, greater on the left. Air bronchograms are present. Debris demonstrated in the trachea with filling of the lower lobe bronchus. This could be due to  aspirated contents, mucous plugging, or an endobronchial obstructing lesion. Follow-up after resolution of acute process is recommended. Patchy airspace infiltrates demonstrated in the right lung base and throughout the aerated left lung. This is likely multifocal pneumonia or aspiration. Diffuse emphysematous changes in the lungs. No pneumothorax. Upper Abdomen: Calcification throughout the pancreas consistent with chronic pancreatitis. An inferior vena caval filter is present.  3 mm stone in the right kidney. No hydronephrosis. Musculoskeletal: Degenerative changes in the spine. No acute bony abnormalities. Old rib fractures. IMPRESSION: 1. Moderate bilateral pleural effusions. 2. Bilateral basilar atelectasis or consolidation, greater on the left. 3. Debris in the trachea with complete filling of the left lower lobe bronchus. 4. Patchy airspace infiltrates in the right lower lung and throughout the aerated left lung with left central consolidation. 5. Altogether, changes likely represent multifocal pneumonia with compressive atelectasis and consolidation in the lung bases. Aspiration could also have this appearance. A left endobronchial obstructing lesion is not excluded and follow-up after resolution of acute process is recommended. 6. Underlying emphysematous changes in the lungs. 7. Cardiac enlargement with small pericardial effusion. 8. Nonobstructing stone in the right kidney. 9. Pancreatic calcification consistent with chronic pancreatitis. Electronically Signed   By: Elsie Gravely M.D.   On: 11/05/2023 20:35   DG Chest 2 View Result Date: 11/05/2023 CLINICAL DATA:  Shortness of breath for 3 days. EXAM: CHEST - 2 VIEW COMPARISON:  10/05/2023 FINDINGS: There is near complete opacification of the left lung with some residual aeration in the apex. Diffuse volume loss on the left. This is all new since previous study. This likely represents pleural effusion and collapse/consolidation of the left lung.  Follow-up to resolution is recommended to exclude a centrally obstructing lesion. Compensatory hyperinflation of the right lung. No pneumothorax. Heart size is obscured but appears mildly enlarged. IMPRESSION: New finding of volume loss and near complete opacification of the left chest. This likely represents pleural effusion with collapse and consolidation in the left lung. Follow-up to resolution is recommended. Electronically Signed   By: Elsie Gravely M.D.   On: 11/05/2023 17:24        Scheduled Meds:  Chlorhexidine  Gluconate Cloth  6 each Topical Nightly   enoxaparin  (LOVENOX ) injection  60 mg Subcutaneous Q12H   furosemide   40 mg Intravenous Q12H   methylPREDNISolone  (SOLU-MEDROL ) injection  10 mg Intravenous Daily   mouth rinse  15 mL Mouth Rinse 4 times per day   pantoprazole  (PROTONIX ) IV  40 mg Intravenous Q12H   sodium chloride  flush  3 mL Intravenous Q12H   Continuous Infusions:  ceFEPime  (MAXIPIME ) IV Stopped (11/07/23 0658)     LOS: 2 days    Time spent: 56 minutes spent on 11/07/2023 caring for this patient face-to-face including chart review, ordering labs/tests, documenting, discussion with nursing staff, consultants, updating family and interview/physical exam    Camellia PARAS [place], DO [facility] Available via Epic secure chat 7am-7pm After these hours, please refer to coverage provider listed on [email or web address] 11/07/2023, 11:49 AM

## 2023-11-07 NOTE — Evaluation (Signed)
 Occupational Therapy Evaluation Patient Details Name: Arthur [name] MRN: [id number] DOB: 29-Sep-1940 Today's Date: 11/07/2023   History of Present Illness   Arthur [name] is a 83 yr old male admitted to the hospital 11-05-23 from SNF rehab with shortness of breath. He was found to have acute hypoxic respiratory failure due to multi-focal PNA, failure to thrive, and stage 2 R ankle pressure injury. PMH: stage 4 lung CA, CAD, a fib, DVT, CVA, chronic hemolytic anemia, HTN, HLD, DM     Clinical Impressions The pt is currently limited by the below listed deficits (see OT problem list). As such, his occupational performance I compromised and he requires significant assistance for self-care management. Per the pt's daughter, prior to middle August when he was intially hospitalized, the pt was modified independent to independent with ADLs and ambulation using a RW. During the session today, he required mod assist x2 for supine to sit, total assist for sock management, and max assist x2 to stand-pivot to the bedside chair using a RW. He was also noted to be with increased deconditioning, quick fatigue with activity, unsteadiness in standing, and generalized weakness. Without further OT services, he is at high risk for restricted ADL participation and further functional decline. Patient will benefit from continued inpatient follow up therapy, <3 hours/day.      If plan is discharge home, recommend the following:   Two people to help with walking and/or transfers;A lot of help with bathing/dressing/bathroom;Assistance with feeding;Assistance with cooking/housework;Help with stairs or ramp for entrance;Direct supervision/assist for medications management     Functional Status Assessment   Patient has had a recent decline in their functional status and demonstrates the ability to make significant improvements in function in a reasonable and predictable amount of time.     Equipment Recommendations    Other (comment) (to be determined pending functional progress)     Recommendations for Other Services         Precautions/Restrictions   Precautions Precautions: Fall Restrictions Other Position/Activity Restrictions: monitor sats     Mobility Bed Mobility Overal bed mobility: Needs Assistance Bed Mobility: Supine to Sit     Supine to sit: Mod assist, +2 for physical assistance, HOB elevated          Transfers Overall transfer level: Needs assistance Equipment used: Rolling walker (2 wheels) Transfers: Sit to/from Stand, Bed to chair/wheelchair/BSC Sit to Stand: Max assist, +2 physical assistance Stand pivot transfers: Max assist, +2 physical assistance         General transfer comment:  (Max assist x2 required for stand-pivot to the chair using a RW, with the pt needing increased multi-modal cues for RW placement, RW advancement, body positioning, BLE placement, and lifting BLE. Pt had diffficulty with lifting and advancing BLE)      Balance       Sitting balance - Comments: CGA to min assist seated EOB     Standing balance-Leahy Scale: Poor Standing balance comment: max assist x2 with RW         ADL either performed or assessed with clinical judgement   ADL Overall ADL's : Needs assistance/impaired Eating/Feeding: Sitting;Moderate assistance Eating/Feeding Details (indicate cue type and reason): In supported sitting Grooming: Moderate assistance;Sitting;Cueing for compensatory techniques;With adaptive equipment Grooming Details (indicate cue type and reason): In supported sitting Upper Body Bathing: Moderate assistance;Sitting Upper Body Bathing Details (indicate cue type and reason): In supported sitting Lower Body Bathing: Maximal assistance Lower Body Bathing Details (indicate cue type and reason): In supported  sitting Upper Body Dressing : Sitting;Moderate assistance Upper Body Dressing Details (indicate cue type and reason): In supported  sitting Lower Body Dressing: Total assistance;Sitting/lateral leans Lower Body Dressing Details (indicate cue type and reason): In supported sitting Toilet Transfer: Maximal assistance;Set up;+2 for physical assistance;+2 for safety/equipment;Stand-pivot;BSC/3in1;Cueing for sequencing   Toileting- Clothing Manipulation and Hygiene: Total assistance Toileting - Clothing Manipulation Details (indicate cue type and reason): bedside commode level, based on clinical judgement              Pertinent Vitals/Pain Pain Assessment Pain Assessment: No/denies pain     Extremity/Trunk Assessment Upper Extremity Assessment Upper Extremity Assessment: Generalized weakness;Right hand dominant;LUE deficits/detail;RUE deficits/detail RUE Deficits / Details: Elbow and hand AROM WFL. Grip strength grossly 3+/5 LUE Deficits / Details: Chronic shoulder AROM limitations. Elbow and hand AROM WFL. Grip strength grossly 3+/5   Lower Extremity Assessment Lower Extremity Assessment: Generalized weakness;RLE deficits/detail;LLE deficits/detail RLE Deficits / Details:  (Required AAROM at bed level) LLE Deficits / Details:  (Required AAROM at bed level)       Communication     Cognition Arousal: Alert Behavior During Therapy: Flat affect, WFL for tasks assessed/performed               OT - Cognition Comments: Oriented to person, to being in the hospital, to month, and to year.          Following commands:  (Able to follow simple 1 step commands)       Cueing  General Comments   Cueing Techniques: Verbal cues;Gestural cues;Tactile cues              Home Living Family/patient expects to be discharged to:: Private residence Living Arrangements: Spouse/significant other;Children (Spouse and son) Available Help at Discharge: Family Type of Home: House       Home Layout: Able to live on main level with bedroom/bathroom;Two level (his bedroom and a full bathroom are on the main level  of the home)     Bathroom Shower/Tub: Walk-in shower         Home Equipment: BSC/3in1;Hospital bed;Wheelchair - manual;Shower seat;Rolling Environmental consultant (2 wheels)   Additional Comments: Per pt's daughter, the pt has been in and out of the hospital and SNF rehab since middle August.  He was admitted to the hospital this time from [facility] rehab.      Prior Functioning/Environment Prior Level of Function : Independent/Modified Independent             Mobility Comments: Per his daughter, prior to middle August when he was initially hospitalized, he was modified independent with ambulation and used a RW for ambulation. ADLs Comments: Per his daughter, prior to middle August when he was intially hospitalized, he was modified independent to independent with ADLs.    OT Problem List: Decreased strength;Decreased range of motion;Decreased activity tolerance;Impaired balance (sitting and/or standing);Decreased coordination;Decreased knowledge of use of DME or AE;Cardiopulmonary status limiting activity;Impaired UE functional use   OT Treatment/Interventions: Self-care/ADL training;Therapeutic exercise;Energy conservation;DME and/or AE instruction;Therapeutic activities;Balance training;Patient/family education      OT Goals(Current goals can be found in the care plan section)   Acute Rehab OT Goals OT Goal Formulation: With patient/family Time For Goal Achievement: 11/21/23 Potential to Achieve Goals: Fair ADL Goals Pt Will Perform Eating: with set-up;with supervision;sitting Pt Will Perform Grooming: with set-up;sitting;with adaptive equipment Pt Will Perform Upper Body Dressing: with min assist;sitting Pt Will Transfer to Toilet: with min assist;stand pivot transfer;bedside commode Additional ADL Goal #1: The pt  will perform bed mobility with CGA, in prep for progressive ADL participation.   OT Frequency:  Min 2X/week    Co-evaluation PT/OT/SLP Co-Evaluation/Treatment:  Yes Reason for Co-Treatment: Complexity of the patient's impairments (multi-system involvement);For patient/therapist safety;To address functional/ADL transfers PT goals addressed during session: Mobility/safety with mobility;Proper use of DME OT goals addressed during session: ADL's and self-care;Proper use of Adaptive equipment and DME      AM-PAC OT 6 Clicks Daily Activity     Outcome Measure Help from another person eating meals?: A Lot Help from another person taking care of personal grooming?: A Lot Help from another person toileting, which includes using toliet, bedpan, or urinal?: Total Help from another person bathing (including washing, rinsing, drying)?: A Lot Help from another person to put on and taking off regular upper body clothing?: A Lot Help from another person to put on and taking off regular lower body clothing?: Total 6 Click Score: 10   End of Session Equipment Utilized During Treatment: Gait belt;Rolling walker (2 wheels);Oxygen Nurse Communication: Mobility status  Activity Tolerance: Patient limited by fatigue Patient left: in chair;with call bell/phone within reach;with chair alarm set;with family/visitor present  OT Visit Diagnosis: Unsteadiness on feet (R26.81);Other abnormalities of gait and mobility (R26.89);Muscle weakness (generalized) (M62.81);Feeding difficulties (R63.3)                Time: 8783-8748 OT Time Calculation (min): 35 min Charges:  OT General Charges $OT Visit: 1 Visit OT Evaluation $OT Eval Moderate Complexity: 1 Mod    Tanzie Rothschild J Harris, OTR/L 11/07/2023, 3:53 PM

## 2023-11-07 NOTE — Progress Notes (Signed)
 Daily Progress Note   Patient Name: Arthur [name]       Date: 11/07/2023 DOB: Jun 25, 1940  Age: 83 y.o. MRN#: [id number] Attending Physician: [place], Eric J, DO Primary Care Physician: Stoney Blizzard, DO Admit Date: 11/05/2023 Length of Stay: 2 days  Reason for Consultation/Follow-up: Establishing goals of care  Subjective:   Reviewed EMR including recent documentation from hospitalist, pulmonologist, and speech therapist.  SLP now stating that based on evaluation today, patient is fully n.p.o. as he is not eliciting a swallow and pooled secretion/boluses in the anterior oral cavity. Discussed care with bedside RN and hospitalist for update.  Hospitalist has already discussed with family worsening of swallow ability and now NPO.  Family has stated that they do not want to make any decisions about change of care until patient's other son is present tomorrow.  He is currently traveling from [place] . Patient laying in bed comfortably visiting with family at bedside so did not interrupt this quality time.  Noted palliative medicine team will continue to follow along with patient's medical journey.  Objective:   Vital Signs:  BP (!) 117/55   Pulse 96   Temp (!) 97.3 F (36.3 C) (Oral)   Resp (!) 34   Ht 5' 11 (1.803 m)   Wt 58.2 kg   SpO2 100%   BMI 17.90 kg/m   Physical Exam: General: Awake, frail, cachectic Cardiovascular: Tachycardia noted Respiratory: increased work of breathing noted, on HFNC 3 L/min  Assessment & Plan:   Assessment: Patient is an 83 year old male with a past medical history of stage IV adenocarcinoma of the right lung, CAD s/p DES to LAD (2023), PAF, history of recurrent DVT on Lovenox , history of CVA, hypertension, hyperlipidemia, and chronic hemolytic anemia on prednisone  10 mg daily as he was admitted on 11/05/2023 from [facility] for management of shortness of breath and hypoxia.  Patient had recently been admitted from 9/7-11  for management of pneumonia and was noted to have severe oropharyngeal dysphagia at that time.  During this admission, patient found to have acute hypoxic respiratory failure secondary to multifocal pneumonia with bilateral pleural effusions.  SLP evaluation showed high risk for aspiration and possible contributing to multifocal pneumonia.  Palliative medicine team consulted to assist with complex medical decision making.   Recommendations/Plan: # Complex medical decision making/goals of care:    - Patient unable to participate in complex medical decision making though continues to voice desire to eat.  Had already discussed care with patient's wife, son/HCPOA, and daughter at bedside on 11/06/2023.  At that time spent explaining pathways for medical care moving forward.  Have explained that due to patient's lack of swallowing ability, will continue to aspirate.  Could focus on comfort at end-of-life.  Family wanted time to consider and additional family member, son, would not be present until tomorrow and they do not want to make decisions before then.  At this time continuing current medical pathway with appropriate medical interventions.  Noted palliative medicine team continue to follow along to engage in conversations as able and appropriate.                -  Code Status: Limited: Do not attempt resuscitation (DNR) -DNR-LIMITED -Do Not Intubate/DNI     # Psycho-social/Spiritual Support:  - Support System: Wife, son, daughters                - Son, Cresencio, is HCPOA   # Discharge Planning:  To Be  Determined  Thank you for allowing the palliative care team to participate in the care Abed R Olshefski.  Tinnie Radar, DO Palliative Care Provider PMT # [phone]  If patient remains symptomatic despite maximum doses, please call PMT at [phone] between 0700 and 1900. Outside of these hours, please call attending, as PMT does not have night coverage.

## 2023-11-07 NOTE — Progress Notes (Signed)
 Speech Language Pathology Treatment:    Patient Details Name: Arthur [name] MRN: [id number] DOB: Sep 14, 1940 Today's Date: 11/07/2023 Time: 0914-0950 SLP Time Calculation (min) (ACUTE ONLY): 36 min  Assessment / Plan / Recommendation Clinical Impression  Pt seen for skilled SLP to determine potential readiness for PO Intake and/or improvement in swallowing. Discussed prior MBS results and recommendation for chin tuck posture with swallow - pt shakes his head no when asked if he could conduct this posture to mitigate aspiration risk. Gross weakness/deconditioning prevents him from generating adequate pressure for oral propulsion. He is not phonating but gestures for communication.   When inquired to pt if he eats large or small amounts prior to admit, he gestured small.   Oral cavity notable for viscous secretions for which pt has poor awareness, indicative of awareness/gross weakness. Even with oral care using toothette and 1/4 tsp [place], he is NOT eliciting a swallow and pools secretions/boluses in anterior oral cavity. Oral suctioning provided to remove [name] ice and moisture. Congested cough noted with moisture and this appeared to be uncomfortable for the pt. Shallow breathing noted with incr RR.   At this time, NO INTAKE IS COMFORTABLE for Arthur [name] - he was agreeable. Oral care needed to maximize his comfort however.   Called son, Arthur [name], re: today's session and pt's level of dysphagia. Informed him that pt is weaker and his swallow ability will not recover. He will not meet nutritional needs nor stop aspirating * even secretions. Advised him no options are present for nutrition - to which he advised he understood. Arthur [name] advised his family is coming today and they will be making decisions and he knows what is happening. At this time, informed Arthur [name] that today pt with discomfort with any intake and that comfort intake may include moisture from toothette only.   SLP will  respectfully sign off.   HPI HPI: Arthur [name] is a 83 y.o. male with medical history significant for stage IV adenocarcinoma of right lung, CAD s/p DES to LAD (2023), PAF, history of recurrent DVT on Lovenox , history of CVA, chronic hemolytic anemia on prednisone  10 mg daily, HTN, HLD who presented to the ED from [facility] for evaluation of shortness of breath and hypoxia.     Patient was recently admitted 9/7-9/11 for left lower lobe pneumonia.  Patient was treated with IV ceftriaxone  and azithromycin  while in hospital.  Patient was noted to have severe oropharyngeal dysphagia on swallow study.  He was also noted to have waxing and waning mentation.  EEG noted moderate diffuse encephalopathy.  Patient was discharged to SNF.      SLP Plan  Discharge SLP treatment due to (comment) (worsening function, no rehab potential unfortunately)          Recommendations  Diet recommendations: NPO (oral moisture) Medication Administration: Via alternative means Compensations: Slow rate;Small sips/bites                  Oral care BID     Dysphagia, oropharyngeal phase (R13.12)     Discharge SLP treatment due to (comment) (worsening function, no rehab potential unfortunately)     Arthur [name]  11/07/2023, 10:02 AM

## 2023-11-07 NOTE — Evaluation (Signed)
 Physical Therapy Evaluation Patient Details Name: Arthur [name] MRN: [id number] DOB: [birth year] Today's Date: 11/07/2023  History of Present Illness  [name] is a 83 yr old male admitted to the hospital 11-05-23 from SNF rehab with shortness of breath. He was found to have acute hypoxic respiratory failure due to multi-focal PNA, failure to thrive, and stage 2 R ankle pressure injury. PMH: stage 4 lung CA, CAD, a fib, DVT, CVA, chronic hemolytic anemia, HTN, HLD, DM  Clinical Impression    Pt admitted with above diagnosis.  Pt currently with functional limitations due to the deficits listed below (see PT Problem List). Pt presents to therapy in bed, family initially present and able to provide insight to PLOF with pt mod I at RW level prior to August, 2025 and since that time pt has been in/out of hospital and SNF. Pt required mod A x 2 for supine to sit EOB with use of bed pad, max A x 2 for sit to stand  and to complete SPT bed to recliner. Pt exhibits poor activity tolerance and required 3 L/min supplemental O2 with saturation >/=90%. Pt left seated in recliner, all needs in place, family returned and nursing staff aware of recommendation for use of stedy to safely return to bed.  Patient will benefit from continued inpatient follow up therapy, <3 hours/day.   Pt will benefit from acute skilled PT to increase their independence and safety with mobility to allow discharge.         If plan is discharge home, recommend the following: Two people to help with walking and/or transfers;Assistance with cooking/housework;Assist for transportation   Can travel by private vehicle   No    Equipment Recommendations None recommended by PT  Recommendations for Other Services       Functional Status Assessment Patient has had a recent decline in their functional status and demonstrates the ability to make significant improvements in function in a reasonable and predictable amount of time.      Precautions / Restrictions Precautions Precautions: Fall Restrictions Weight Bearing Restrictions Per Provider Order: No Other Position/Activity Restrictions: monitor sats      Mobility  Bed Mobility Overal bed mobility: Needs Assistance Bed Mobility: Supine to Sit     Supine to sit: Mod assist, +2 for physical assistance, HOB elevated     General bed mobility comments: increased time, cues and use of bed pad, pt able to initiate B LE to EOB and reported discomfort L UE (hx of hematormas)    Transfers Overall transfer level: Needs assistance Equipment used: Rolling walker (2 wheels) Transfers: Sit to/from Stand, Bed to chair/wheelchair/BSC Sit to Stand: Max assist, +2 physical assistance Stand pivot transfers: Max assist, +2 physical assistance         General transfer comment: cues, increased time once in standing pt required mod A x 2 for balance, during the course of SPT bed to recliner pt exhibited difficulty advancing R LE with facilitation for weight shifting and multimodal cues, pt demonstrated SOB with exertion on 3 L/min via [place]    Ambulation/Gait               General Gait Details: NT  Stairs            Wheelchair Mobility     Tilt Bed    Modified Rankin (Stroke Patients Only)       Balance Overall balance assessment: Needs assistance Sitting-balance support: Feet supported Sitting balance-Leahy Scale: Fair Sitting balance - Comments:  CGA to min assist seated EOB   Standing balance support: Bilateral upper extremity supported, During functional activity, Reliant on assistive device for balance Standing balance-Leahy Scale: Zero Standing balance comment: max assist x2 with RW                             Pertinent Vitals/Pain Pain Assessment Pain Assessment: No/denies pain    Home Living Family/patient expects to be discharged to:: Private residence Living Arrangements: Spouse/significant other;Children (Spouse and  son) Available Help at Discharge: Family Type of Home: House         Home Layout: Able to live on main level with bedroom/bathroom;Two level (his bedroom and a full bathroom are on the main level of the home) Home Equipment: BSC/3in1;Hospital bed;Wheelchair - manual;Shower seat;Rolling Environmental consultant (2 wheels) Additional Comments: Per pt's daughter, the pt has been in and out of the hospital and SNF rehab since middle August.  He was admitted to the hospital this time from [facility] rehab.    Prior Function Prior Level of Function : Independent/Modified Independent             Mobility Comments: Per his daughter, prior to middle August when he was initially hospitalized, he was modified independent with ambulation and used a RW for ambulation. ADLs Comments: Per his daughter, prior to middle August when he was intially hospitalized, he was modified independent to independent with ADLs.     Extremity/Trunk Assessment   Upper Extremity Assessment Upper Extremity Assessment: Defer to OT evaluation RUE Deficits / Details: Elbow and hand AROM WFL. Grip strength grossly 3+/5 LUE Deficits / Details: Chronic shoulder AROM limitations. Elbow and hand AROM WFL. Grip strength grossly 3+/5    Lower Extremity Assessment Lower Extremity Assessment: Generalized weakness RLE Deficits / Details:  (Required AAROM at bed level) LLE Deficits / Details:  (Required AAROM at bed level)       Communication   Communication Communication: No apparent difficulties    Cognition Arousal: Alert Behavior During Therapy: Flat affect, WFL for tasks assessed/performed   PT - Cognitive impairments: No apparent impairments                       PT - Cognition Comments: soft spoken and often elects to use gestures to communicate Following commands: Impaired Following commands impaired: Only follows one step commands consistently     Cueing Cueing Techniques: Verbal cues, Gestural cues, Tactile  cues, Visual cues     General Comments      Exercises     Assessment/Plan    PT Assessment Patient needs continued PT services  PT Problem List Decreased strength;Decreased activity tolerance;Decreased balance;Decreased mobility;Decreased coordination;Cardiopulmonary status limiting activity       PT Treatment Interventions DME instruction;Gait training;Functional mobility training;Therapeutic activities;Therapeutic exercise;Balance training;Neuromuscular re-education;Patient/family education    PT Goals (Current goals can be found in the Care Plan section)  Acute Rehab PT Goals Patient Stated Goal: to feel better PT Goal Formulation: With patient Time For Goal Achievement: 11/21/23 Potential to Achieve Goals: Fair    Frequency Min 2X/week     Co-evaluation PT/OT/SLP Co-Evaluation/Treatment: Yes Reason for Co-Treatment: Complexity of the patient's impairments (multi-system involvement);For patient/therapist safety;To address functional/ADL transfers PT goals addressed during session: Mobility/safety with mobility;Proper use of DME OT goals addressed during session: ADL's and self-care;Proper use of Adaptive equipment and DME       AM-PAC PT 6 Clicks Mobility  Outcome  Measure Help needed turning from your back to your side while in a flat bed without using bedrails?: A Lot Help needed moving from lying on your back to sitting on the side of a flat bed without using bedrails?: Total Help needed moving to and from a bed to a chair (including a wheelchair)?: Total Help needed standing up from a chair using your arms (e.g., wheelchair or bedside chair)?: Total Help needed to walk in hospital room?: Total Help needed climbing 3-5 steps with a railing? : Total 6 Click Score: 7    End of Session Equipment Utilized During Treatment: Gait belt;Oxygen Activity Tolerance: Patient limited by fatigue Patient left: in chair;with call bell/phone within reach;with chair alarm  set Nurse Communication: Mobility status;Need for lift equipment (stedy to return to bed) PT Visit Diagnosis: Unsteadiness on feet (R26.81);Muscle weakness (generalized) (M62.81);Difficulty in walking, not elsewhere classified (R26.2);Other abnormalities of gait and mobility (R26.89)    Time: 8783-8749 PT Time Calculation (min) (ACUTE ONLY): 34 min   Charges:   PT Evaluation $PT Eval Low Complexity: 1 Low   PT General Charges $$ ACUTE PT VISIT: 1 Visit         [place], PT Acute Rehab   [place] VEAR Drone 11/07/2023, 5:20 PM

## 2023-11-08 DIAGNOSIS — R0602 Shortness of breath: Secondary | ICD-10-CM

## 2023-11-08 DIAGNOSIS — Z7189 Other specified counseling: Secondary | ICD-10-CM | POA: Diagnosis not present

## 2023-11-08 DIAGNOSIS — R52 Pain, unspecified: Secondary | ICD-10-CM

## 2023-11-08 DIAGNOSIS — J9621 Acute and chronic respiratory failure with hypoxia: Secondary | ICD-10-CM | POA: Diagnosis not present

## 2023-11-08 DIAGNOSIS — Z79899 Other long term (current) drug therapy: Secondary | ICD-10-CM

## 2023-11-08 DIAGNOSIS — Z515 Encounter for palliative care: Secondary | ICD-10-CM | POA: Diagnosis not present

## 2023-11-08 DIAGNOSIS — J188 Other pneumonia, unspecified organism: Secondary | ICD-10-CM | POA: Diagnosis not present

## 2023-11-08 DIAGNOSIS — C3490 Malignant neoplasm of unspecified part of unspecified bronchus or lung: Secondary | ICD-10-CM | POA: Diagnosis not present

## 2023-11-08 LAB — LEGIONELLA PNEUMOPHILA SEROGP 1 UR AG: L. pneumophila Serogp 1 Ur Ag: NEGATIVE

## 2023-11-08 LAB — BASIC METABOLIC PANEL WITH GFR
Anion gap: 13 (ref 5–15)
BUN: 28 mg/dL — ABNORMAL HIGH (ref 8–23)
CO2: 35 mmol/L — ABNORMAL HIGH (ref 22–32)
Calcium: 8.7 mg/dL — ABNORMAL LOW (ref 8.9–10.3)
Chloride: 100 mmol/L (ref 98–111)
Creatinine, Ser: 1.19 mg/dL (ref 0.61–1.24)
GFR, Estimated: >60 mL/min (ref >60)
Glucose, Bld: 75 mg/dL (ref 70–99)
Potassium: 3.9 mmol/L (ref 3.5–5.1)
Sodium: 148 mmol/L — ABNORMAL HIGH (ref 135–145)

## 2023-11-08 LAB — CBC
HCT: 28.6 % — ABNORMAL LOW (ref 39.0–52.0)
Hemoglobin: 8.7 g/dL — ABNORMAL LOW (ref 13.0–17.0)
MCH: 27.1 pg (ref 26.0–34.0)
MCHC: 30.4 g/dL (ref 30.0–36.0)
MCV: 89.1 fL (ref 80.0–100.0)
Platelets: 390 K/uL (ref 150–400)
RBC: 3.21 MIL/uL — ABNORMAL LOW (ref 4.22–5.81)
RDW: 17.8 % — ABNORMAL HIGH (ref 11.5–15.5)
WBC: 9.8 K/uL (ref 4.0–10.5)
nRBC: 0 % (ref 0.0–0.2)

## 2023-11-08 MED ORDER — GLYCOPYRROLATE 1 MG PO TABS: 1.0000 mg | ORAL_TABLET | ORAL | Status: DC | PRN

## 2023-11-08 MED ORDER — HALOPERIDOL LACTATE 2 MG/ML PO CONC: 0.5000 mg | ORAL | Status: DC | PRN

## 2023-11-08 MED ORDER — HALOPERIDOL 1 MG PO TABS: 0.5000 mg | ORAL_TABLET | ORAL | Status: DC | PRN

## 2023-11-08 MED ORDER — MORPHINE BOLUS VIA INFUSION: 1.0000 mg | INTRAVENOUS | Status: DC | PRN

## 2023-11-08 MED ORDER — HALOPERIDOL LACTATE 5 MG/ML IJ SOLN: 0.5000 mg | INTRAMUSCULAR | Status: DC | PRN

## 2023-11-08 MED ORDER — LORAZEPAM 2 MG/ML PO CONC: 1.0000 mg | ORAL | Status: DC | PRN

## 2023-11-08 MED ORDER — GLYCOPYRROLATE 0.2 MG/ML IJ SOLN: 0.2000 mg | INTRAMUSCULAR | Status: DC | PRN

## 2023-11-08 MED ORDER — MORPHINE 100MG IN NS 100ML (1MG/ML) PREMIX INFUSION
1.0000 mg/h | INTRAVENOUS | Status: DC
  Administered 2023-11-08: 1 mg/h via INTRAVENOUS
  Filled 2023-11-08: qty 100

## 2023-11-08 MED ORDER — DIPHENHYDRAMINE HCL 50 MG/ML IJ SOLN: 12.5000 mg | Freq: Four times a day (QID) | INTRAMUSCULAR | Status: DC | PRN

## 2023-11-08 MED ORDER — NALOXONE HCL 0.4 MG/ML IJ SOLN: 0.4000 mg | INTRAMUSCULAR | Status: DC | PRN

## 2023-11-08 MED ORDER — SODIUM CHLORIDE 0.9% FLUSH: 9.0000 mL | INTRAVENOUS | Status: DC | PRN

## 2023-11-08 MED ORDER — LORAZEPAM 2 MG/ML IJ SOLN: 1.0000 mg | INTRAMUSCULAR | Status: DC | PRN

## 2023-11-08 MED ORDER — DIPHENHYDRAMINE HCL 12.5 MG/5ML PO ELIX: 12.5000 mg | ORAL_SOLUTION | Freq: Four times a day (QID) | ORAL | Status: DC | PRN

## 2023-11-08 MED ORDER — LORAZEPAM 1 MG PO TABS: 1.0000 mg | ORAL_TABLET | ORAL | Status: DC | PRN

## 2023-11-08 MED ORDER — MORPHINE SULFATE 1 MG/ML IV SOLN PCA
INTRAVENOUS | Status: DC
  Filled 2023-11-08: qty 30

## 2023-11-08 MED ORDER — MORPHINE SULFATE (PF) 2 MG/ML IV SOLN
1.0000 mg | INTRAVENOUS | Status: DC | PRN
  Administered 2023-11-08 (×2): 1 mg via INTRAVENOUS
  Filled 2023-11-08 (×2): qty 1

## 2023-11-08 NOTE — Progress Notes (Addendum)
 PROGRESS NOTE    Arthur [name]  FMW:[id number] DOB: 1940/08/28 DOA: 11/05/2023 PCP: Stoney Blizzard, DO    Brief Narrative:   Arthur [name] is a 83 y.o. 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 Patient reports developing shortness of breath about 4 days ago.  He has had associated cough productive of thick white sputum.  He says he normally ambulates with use of a walker but has felt so sick the last few days he has not been walking much at all.   Patient was recently admitted 9/7-9/11 for left lower lobe pneumonia.  Patient was treated with IV ceftriaxone  and azithromycin  while in hospital.  Patient was noted to have severe oropharyngeal dysphagia on swallow study.  He was also noted to have waxing and waning mentation.  EEG noted moderate diffuse encephalopathy.  Patient was discharged to SNF.   In the ED, BP 135/59, pulse 106, RR 29, temp 98.3 F, SpO2 100% on 4 L O2 via [place].  SpO2 dropped to 78% while on 6 L.  Patient was transitioned to NRB, then BiPAP with improvement in SpO2. WBC 7.0, hemoglobin 8.8, platelets 362, sodium 143, potassium 4.7, bicarb 33, BUN 16, creatinine 1.01, serum glucose 153, proBNP 3136, troponin T 56 > 53, lactic acid 0.7. Blood cultures ordered and UA pending collection. CXR with volume loss and near complete opacification of the left chest likely representing pleural effusion with collapse and consolidation in the left lung.   CT chest without contrast: Moderate bilateral pleural effusions, bilateral basilar atelectasis or consolidation, greater on the left, debris in the trachea with complete filling of the left lower lobe bronchus, patchy airspace  infiltrates in the right lower lung and throughout the aerated left lung with left central consolidation c/w multifocal pneumonia with compressive atelectasis and consolidation in the lung bases. Aspiration could also have this appearance. A left endobronchial obstructing lesion is not excluded and follow-up after resolution of acute process is recommended. Underlying emphysematous changes in the lungs. Cardiac enlargement with small pericardial effusion. Nonobstructing stone in the right kidney. Pancreatic calcification consistent with chronic pancreatitis.   Patient was given IV vancomycin  and cefepime .  EDP discussed with PCCM (Dr. Layman), their team will see in consultation in the morning and are available overnight if needed.  The hospitalist service was consulted for admission.  Assessment & Plan:   Acute hypoxic respiratory failure due to multifocal pneumonia Bilateral pleural effusions: Recent admission for LLL PNA now admitted with hypoxic respiratory failure due to multifocal pneumonia with bilateral pleural effusions and bibasilar atelectasis.  Aspiration risk is high and potentially contributing.  Left endobronchial obstructing lesion not excluded.  Strep pneumo urine antigen negative. -- TTE with LVEF 60 to 65%, biatrial enlargement, moderate TR, trivial MR, noted AAS, IVC dilated -- Blood Cultures x 2: no growth x 3 days -- Legionella urine antigen: Pending -- Lasix  40 mg IV every 12 hours -- Cefepime  2 g IV every 12 hours -- Continue supplemental O2, goal SpO2 greater than 92%, on 2 L nasal cannula with SpO2 100% -- BiPAP available as needed -- Strict I's and O's and daily weights  -- Aspiration precautions  Addendum: 1640 Family meeting at  bedside, HCPOA (Vipul) and multiple family members agree to transition to comfort measures.  Comfort care orders placed including Haldol, Ativan, Robinul, morphine  drip.  Discontinue cefepime .  Will continue Lasix  for respiratory support  given pleural effusion.  Dysphagia -- Seen by SLP 10/9, recommend few 1/2 tsp of water  and 1/4 tsp [name] ice for comfort ONLY; otherwise n.p.o. -- Aspiration precaution   Paroxysmal atrial fibrillation: Remains in atrial fibrillation, HR mostly <110.  Not on rate or rhythm controlling agents as an outpatient.  -- Continue Lovenox  anticoagulation.   History of recurrent DVT: -- Continue Lovenox .   Chronic hemolytic anemia: Hemoglobin stable at 8.8.   -- Solu-Medrol  10 mg IV every 24 hours (substituted for home prednisone  10 mg 2/2 dysphagia) Continue home prednisone  10 mg daily.   History of CVA: -- Continue Lovenox  -- Plavix /statin on hold due to dysphagia, n.p.o. status   CAD s/p DES to LAD 2023: -- Plavix  and rosuvastatin  on hold due to n.p.o. status   Hypertension: Does not appear to be on antihypertensives as an outpatient.  BP is currently stable.  Continue to monitor.   Stage IV right lung adenocarcinoma: Follows with oncology, Dr. Sherrod.  Current therapy is with alectinib, will hold for now.  Pressure injury, stage II right ankle POA Wound 11/06/23 0100 Pressure Injury Ankle Right;Lateral Stage 2 -  Partial thickness loss of dermis presenting as a shallow open injury with a red, pink wound bed without slough. (Active)  -- Seen by wound RN -- Prevalon boots bilaterally to offload heels; high risk for breakdown -- Single layer of xeroform to the toe tips and gauze. Change every other day -- Foam the the right heel; change every three days.   Adult failure to thrive Patient with recurrent hospitalizations with pneumonia likely complicated by recurrent aspiration events.  Patient appears cachectic with adult failure to thrive.  Discussed with patient's son at bedside on 10/9, will update CODE STATUS to reflect DNR/DNI given that he would have a poor outcome during the resuscitative event.  Patient with continued poor tolerance of any oral intake.  Discussed with multiple  family members present at bedside as well as patient.  No other further options and patient does not wish for any type of feeding tube.  Awaiting for additional son driving in from [place]  with anticipation of residential hospice placement.  Overall poor prognosis given his comorbidities advanced metastatic lung cancer and adult failure to thrive.   DVT prophylaxis: Lovenox     Code Status: Limited: Do not attempt resuscitation (DNR) -DNR-LIMITED -Do Not Intubate/DNI  Family Communication: Updated son present at bedside this morning  Disposition Plan:  Level of care: Stepdown Status is: Inpatient Remains inpatient appropriate because: IV antibiotics, IV diuresis, weaning of supplemental oxygen, therapy evaluation, palliative care consultation, overall prognosis poor; anticipate residential hospice placement.    Consultants:  PCCM Palliative care  Procedures:  None  Antimicrobials:  Vancomycin  10/8>> Cefepime  10/8>>   Subjective: Patient seen examined bedside, lying in bed.  Alert.  Multiple family members present.  Attempted small amounts of oral intake this morning, unable to tolerate with immediate coughing from patient.  Discussed with patient and multiple family members no further viable options given recurrence of events likely need to transition to a more comfort approach.  Awaiting additional son from [place]  to arrive tomorrow.  Discussed anticipation of residential hospice placement, family seems agreeable.  Chest x-ray improved, respiratory status also improved. No other specific complaints or concerns at this time.  Patient denies headache, no chest pain, no abdominal pain, no fever.  Discussed with RN at bedside this morning.  Also discussed with PCCM and palliative care this morning.  Objective: Vitals:   11/08/23 0700 11/08/23 0800 11/08/23 0900 11/08/23 1000  BP: (!) 125/109 132/60 (!) 133/53 (!) 128/51  Pulse: 97 91 100 99  Resp:  18 20 (!) 30  Temp:  (!) 97.5 F  (36.4 C)    TempSrc:  Axillary    SpO2: 100% 99% 100% 100%  Weight:      Height:        Intake/Output Summary (Last 24 hours) at 11/08/2023 1107 Last data filed at 11/08/2023 9188 Gross per 24 hour  Intake 200.06 ml  Output 2400 ml  Net -2199.94 ml   Filed Weights   11/06/23 0000 11/07/23 0500  Weight: 59.5 kg 58.2 kg    Examination:  Physical Exam: GEN: NAD, alert, chronically cachectic/ill in appearance HEENT: NCAT, PERRL, sclera clear, dry mucous membranes PULM: Diminished breath sounds left base with mild crackles, no wheezing, normal respiratory effort without accessory muscle use, on 2 L nasal cannula with SpO2 100% at rest CV: RRR w/o M/G/R GI: abd soft, NTND, + BS MSK: no peripheral edema Integumentary: Right ankle wound as depicted below    Data Reviewed: I have personally reviewed following labs and imaging studies  CBC: Recent Labs  Lab 11/05/23 1700 11/06/23 0319 11/07/23 0341 11/08/23 0244  WBC 7.0 10.0 10.6* 9.8  NEUTROABS 6.0  --   --   --   HGB 8.8* 8.2* 8.2* 8.7*  HCT 28.0* 27.2* 26.5* 28.6*  MCV 90.0 90.7 89.5 89.1  PLT 362 331 343 390   Basic Metabolic Panel: Recent Labs  Lab 11/05/23 1700 11/06/23 0319 11/07/23 0341 11/08/23 0244  NA 143 144 147* 148*  K 4.7 4.2 3.9 3.9  CL 103 104 103 100  CO2 33* 31 36* 35*  GLUCOSE 153* 131* 87 75  BUN 16 18 24* 28*  CREATININE 1.01 1.00 1.12 1.19  CALCIUM  8.7* 8.5* 8.6* 8.7*  MG  --   --  1.8  --   PHOS  --   --  5.0*  --    GFR: Estimated Creatinine Clearance: 38.7 mL/min (by C-G formula based on SCr of 1.19 mg/dL). Liver Function Tests: Recent Labs  Lab 11/05/23 1700  AST 20  ALT 13  ALKPHOS 75  BILITOT 1.3*  PROT 5.3*  ALBUMIN  2.8*   No results for input(s): LIPASE, AMYLASE in the last 168 hours. No results for input(s): AMMONIA in the last 168 hours. Coagulation Profile: Recent Labs  Lab 11/05/23 1700  INR 1.1   Cardiac Enzymes: No results for input(s):  CKTOTAL, CKMB, CKMBINDEX, TROPONINI in the last 168 hours. BNP (last 3 results) Recent Labs    11/05/23 1700  PROBNP 3,136.0*   HbA1C: No results for input(s): HGBA1C in the last 72 hours. CBG: Recent Labs  Lab 11/06/23 0821  GLUCAP 103*   Lipid Profile: No results for input(s): CHOL, HDL, LDLCALC, TRIG, CHOLHDL, LDLDIRECT in the last 72 hours. Thyroid  Function Tests: No results for input(s): TSH, T4TOTAL, FREET4, T3FREE, THYROIDAB in the last 72 hours. Anemia Panel: No results for input(s): VITAMINB12, FOLATE, FERRITIN, TIBC, IRON , RETICCTPCT in the last 72 hours. Sepsis Labs: Recent Labs  Lab 11/05/23 1711 11/05/23 1859  LATICACIDVEN 0.9 0.7    Recent Results (from the past 240 hours)  Culture, blood (Routine x 2)     Status: None (  Preliminary result)   Collection Time: 11/05/23  5:03 PM   Specimen: BLOOD RIGHT ARM  Result Value Ref Range Status   Specimen Description   Final    BLOOD RIGHT ARM Performed at [facility], [address], [place], [place] [address]    Special Requests   Final    BOTTLES DRAWN AEROBIC AND ANAEROBIC Blood Culture adequate volume Performed at [facility], [address], [place], [place] [address]    Culture   Final    NO GROWTH 3 DAYS Performed at [facility], [address], [place], [place] [address]    Report Status PENDING  Incomplete  Culture, blood (Routine x 2)     Status: None (Preliminary result)   Collection Time: 11/05/23  5:52 PM   Specimen: BLOOD  Result Value Ref Range Status   Specimen Description   Final    BLOOD LEFT ANTECUBITAL Performed at [facility], [address], [place], [place] [address]    Special Requests   Final    BOTTLES DRAWN AEROBIC AND ANAEROBIC Blood Culture results may not be optimal due to an inadequate volume of blood received in culture bottles Performed at [facility], [address] [place], [place] [address]    Culture   Final    NO GROWTH 3 DAYS Performed at [facility], [address], [place], [place] [address]    Report Status PENDING  Incomplete  MRSA Next Gen by PCR, Nasal     Status: None   Collection Time: 11/05/23 10:00 PM   Specimen: Nasal Mucosa; Nasal Swab  Result Value Ref Range Status   MRSA by PCR Next Gen NOT DETECTED NOT DETECTED Final    Comment: (NOTE) The GeneXpert MRSA Assay (FDA approved for NASAL specimens only), is one component of a comprehensive MRSA colonization surveillance program. It is not intended to diagnose MRSA infection nor to guide or monitor treatment for MRSA infections. Test performance is not FDA approved in patients less than 33 years old. Performed at [facility], [address], [place], [place] [address]          Radiology Studies: DG CHEST PORT 1 VIEW Result Date: 11/07/2023 CLINICAL DATA:  Dyspnea. EXAM: PORTABLE CHEST 1 VIEW COMPARISON:  11/05/2023 FINDINGS: Normal-sized heart. Extensive left lung airspace opacity with improvement. Continued dense consolidation in the left lower lobe. Interval patchy density in the right lower lung zone. Probable small bilateral pleural effusions. Thoracic spine degenerative changes. IMPRESSION: 1. Improved left lung pneumonia/atelectasis. 2. Interval right lower lung zone pneumonia or alveolar edema. 3. Probable small bilateral pleural effusions. Electronically Signed   By: Elspeth Bathe M.D.   On: 11/07/2023 08:31        Scheduled Meds:  Chlorhexidine  Gluconate Cloth  6 each Topical Nightly   enoxaparin  (LOVENOX ) injection  60 mg Subcutaneous Q12H   furosemide   40 mg Intravenous Q12H   methylPREDNISolone  (SOLU-MEDROL ) injection  10 mg Intravenous Daily   mouth rinse  15 mL Mouth Rinse 4 times per day   pantoprazole  (PROTONIX ) IV  40 mg Intravenous Q12H   sodium chloride  flush  3 mL Intravenous Q12H   Continuous Infusions:   ceFEPime  (MAXIPIME ) IV Stopped (11/08/23 0712)     LOS: 3 days    Time spent: 52 minutes spent on 11/08/2023 caring for this patient face-to-face including chart review, ordering labs/tests, documenting, discussion with nursing staff, consultants, updating family and  interview/physical exam    Camellia PARAS [place], DO [facility] Available via Epic secure chat 7am-7pm After these hours, please refer to coverage provider listed on [email or web address] 11/08/2023, 11:07 AM

## 2023-11-08 NOTE — Progress Notes (Addendum)
 Daily Progress Note   Patient Name: Arthur [name]       Date: 11/08/2023 DOB: 1940-06-27  Age: 83 y.o. MRN#: [id number] Attending Physician: [place], Eric J, DO Primary Care Physician: Stoney Blizzard, DO Admit Date: 11/05/2023 Length of Stay: 3 days  Reason for Consultation/Follow-up: Establishing goals of care  Subjective:   Reviewed EMR including recent documentation from hospitalist.  Discussed care with bedside RN for medical updates.  At time of EMR review in past 24 hours patient has received as needed IV morphine  1 mg x 1 dose.  Patient resting comfortably in bed with multiple family members at bedside visiting. RN noted patient's other son is driving from [place]  at this time and will hopefully be here later today.  Family has stated they will make further decisions once patient' other son present at bedside.s  Objective:   Vital Signs:  BP 132/60   Pulse 91   Temp (!) 97.4 F (36.3 C) (Axillary)   Resp 18   Ht 5' 11 (1.803 m)   Wt 58.2 kg   SpO2 99%   BMI 17.90 kg/m   Physical Exam: General: Awake, frail, cachectic Cardiovascular: Tachycardia noted Respiratory: Slightly increased work of breathing noted, on HFNC 3 L/min  Assessment & Plan:   Assessment: Patient is an 83 year old male with a past medical history of stage IV adenocarcinoma of the right lung, CAD s/p DES to LAD (2023), PAF, history of recurrent DVT on Lovenox , history of CVA, hypertension, hyperlipidemia, and chronic hemolytic anemia on prednisone  10 mg daily as he was admitted on 11/05/2023 from [facility] for management of shortness of breath and hypoxia.  Patient had recently been admitted from 9/7-11 for management of pneumonia and was noted to have severe oropharyngeal dysphagia at that time.  During this admission, patient found to have acute hypoxic respiratory failure secondary to multifocal pneumonia with bilateral pleural effusions.  SLP evaluation showed high risk  for aspiration and possible contributing to multifocal pneumonia.  Palliative medicine team consulted to assist with complex medical decision making.   Recommendations/Plan: # Complex medical decision making/goals of care:    - Had already discussed care with patient's wife, son/HCPOA, and daughter at bedside on 11/06/2023.  Have already explained that due to patient's lack of swallowing ability, will continue to aspirate.  Family has wanted time for other family members to come to bedside, currently awaiting son traveling from [place] .  At this time continuing current medical pathway with appropriate medical interventions.  Noted palliative medicine team continue to follow along to engage in conversations as able and appropriate.                -  Code Status: Limited: Do not attempt resuscitation (DNR) -DNR-LIMITED -Do Not Intubate/DNI     # Psycho-social/Spiritual Support:  - Support System: Wife, son, daughters                - Son, Arthur [name], is HCPOA   # Discharge Planning:  To Be Determined  Thank you for allowing the palliative care team to participate in the care Arthur [name].  Tinnie Radar, DO Palliative Care Provider PMT # [phone]  If patient remains symptomatic despite maximum doses, please call PMT at [phone] between 0700 and 1900. Outside of these hours, please call attending, as PMT does not have night coverage.   UPDATED: Informed later in afternoon that patient has transitioned to full comfort focused care after hospitalist met and spoke with family  at bedside. Plan is to start continuous morphine  infusion for management of end of life symptoms.  Reviewed recent BMP and GFR noted to be over 60. This provider was able to assist with symptom management medications.  # Symptom management Patient is receiving these palliative interventions for symptom management with an intent to improve quality of life.     -Pain/Dyspnea, acute in the setting of end-of-life care                                -Start IV Morphine  continuous infusion with prn boluses dosing. This can be titrated based on patient's symptom burden.                   -Anxiety/agitation, in the setting of end-of-life care                               -Agree with IV Ativan 1 mg every 4 hours as needed. Continue to adjust based on patient's symptom burden.                  -Secretions, in the setting of end-of-life care                               -Agree with IV glycopyrrolate 0.2 mg every 4 hours as needed.   Billing based on MDM: High  Problems Addressed: One or more chronic illnesses with severe exacerbation, progression, or side effects of treatment.  Risks: Parenteral controlled substances

## 2023-11-08 NOTE — Progress Notes (Signed)
 PCA Injection Syringe Wasted  Wasted 27 ml of PCA Syringe due to discontinued of PCA pump.  Witnessed by Charmaine Lewis, RN on 11/08/23 1815.  Was not pulled in pyxis, sent by main pharmacy through tube station so could not document waste in pyxis.  Delon Croak, RN

## 2023-11-09 DIAGNOSIS — J9621 Acute and chronic respiratory failure with hypoxia: Secondary | ICD-10-CM | POA: Diagnosis not present

## 2023-11-09 MED ORDER — POLYVINYL ALCOHOL 1.4 % OP SOLN
1.0000 [drp] | OPHTHALMIC | Status: DC | PRN
  Administered 2023-11-09: 1 [drp] via OPHTHALMIC
  Filled 2023-11-09: qty 15

## 2023-11-09 MED ORDER — DEXTROSE 50 % IV SOLN
INTRAVENOUS | Status: AC
  Filled 2023-11-09: qty 50

## 2023-11-10 LAB — CULTURE, BLOOD (ROUTINE X 2)
Culture: NO GROWTH
Culture: NO GROWTH
Special Requests: ADEQUATE

## 2023-11-13 ENCOUNTER — Other Ambulatory Visit: Payer: Self-pay | Admitting: Pharmacy Technician

## 2023-11-13 ENCOUNTER — Other Ambulatory Visit: Payer: Self-pay

## 2023-11-13 NOTE — Progress Notes (Signed)
 Disenrolled patient deceased

## 2023-11-29 NOTE — Death Summary Note (Signed)
 DEATH SUMMARY   Patient Details  Name: Arthur [name] MRN: [id number] DOB: 09-Apr-1940 ERE:Zczmyjmu, Elyce, DO Admission/Discharge Information   Admit Date:  29-Nov-2023  Date of Death: Date of Death: 12/03/2023  Time of Death: Time of Death: 0758  Length of Stay: 4   Principle Cause of death:  Multifocal pneumonia Acute hypoxic respiratory failure Bilateral pleural effusion Dysphagia/aspiration Stage IV lung adenocarcinoma  Hospital Diagnoses: Principal Problem:   Acute on chronic respiratory failure with hypoxia (HCC) Active Problems:   Multifocal pneumonia   Bilateral pleural effusion   Coronary artery disease due to lipid rich plaque   Non-small cell carcinoma of lung, stage 4 (HCC)   Paroxysmal atrial fibrillation (HCC)   Dysphagia   Bronchial obstruction   Acute hypoxic respiratory failure (HCC)   Counseling and coordination of care   Goals of care, counseling/discussion   Palliative care encounter   High risk medication use   Pain   Medication management   Shortness of breath   History of present illness:    Arthur [name] is a 83 y.o. male with past medical history significant for  stage IV adenocarcinoma of right lung, CAD s/p DES to LAD 2021/12/15), PAF, history of recurrent DVT on Lovenox , history of CVA, chronic hemolytic anemia on prednisone  10 mg daily, HTN, HLD who presented to [facility] ED on November 29, 2023 from [facility] health and rehab for evaluation of shortness of breath and hypoxia. Patient reports developing shortness of breath about 4 days ago.  He has had associated cough productive of thick white sputum.  He says he normally ambulates with use of a walker but has felt so sick the last few days he has not been walking much at all.   Patient was recently admitted 9/7-9/11 for left lower lobe pneumonia.  Patient was treated with IV ceftriaxone  and azithromycin  while in hospital.  Patient was noted to have severe oropharyngeal dysphagia on swallow study.  He was  also noted to have waxing and waning mentation.  EEG noted moderate diffuse encephalopathy.  Patient was discharged to SNF.   In the ED, BP 135/59, pulse 106, RR 29, temp 98.3 F, SpO2 100% on 4 L O2 via [place].  SpO2 dropped to 78% while on 6 L.  Patient was transitioned to NRB, then BiPAP with improvement in SpO2. WBC 7.0, hemoglobin 8.8, platelets 362, sodium 143, potassium 4.7, bicarb 33, BUN 16, creatinine 1.01, serum glucose 153, proBNP 3136, troponin T 56 > 53, lactic acid 0.7. Blood cultures ordered and UA pending collection. CXR with volume loss and near complete opacification of the left chest likely representing pleural effusion with collapse and consolidation in the left lung.   CT chest without contrast: Moderate bilateral pleural effusions, bilateral basilar atelectasis or consolidation, greater on the left, debris in the trachea with complete filling of the left lower lobe bronchus, patchy airspace infiltrates in the right lower lung and throughout the aerated left lung with left central consolidation c/w multifocal pneumonia with compressive atelectasis and consolidation in the lung bases. Aspiration could also have this appearance. A left endobronchial obstructing lesion is not excluded and follow-up after resolution of acute process is recommended. Underlying emphysematous changes in the lungs. Cardiac enlargement with small pericardial effusion. Nonobstructing stone in the right kidney. Pancreatic calcification consistent with chronic pancreatitis.   Patient was given IV vancomycin  and cefepime .  EDP discussed with PCCM (Dr. Layman), their team will see in consultation in the morning and are available overnight if needed.  The hospitalist service was consulted for admission.  Assessment and Plan:  Acute hypoxic respiratory failure Multifocal pneumonia Bilateral pleural effusions: Recent admission for LLL PNA now admitted with hypoxic respiratory failure due to multifocal pneumonia with  bilateral pleural effusions and bibasilar atelectasis.  Aspiration risk is high large contributing factor.  Left endobronchial obstructing lesion not excluded.  Strep pneumo urine antigen negative. TTE with LVEF 60 to 65%, biatrial enlargement, moderate TR, trivial MR, noted AAS, IVC dilated.  Patient was treated with empiric antibiotics with cefepime , received IV diuresis with Lasix  and BiPAP as needed, and supplemental oxygen.  Given patient's inability to tolerate any oral intake after being seen by speech therapy; palliative care was consulted for assistance with goals of care and medical decision making.  Given patient's overall quality of life was poor with failure to thrive, decision was made by family to transition to comfort measures.  Patient passed away on 11/28/2023 at 7:58 AM.  Family present at bedside.   Dysphagia Paroxysmal atrial fibrillation: History of recurrent DVT: Chronic hemolytic anemia: History of CVA: CAD s/p DES to LAD 2023: Hypertension: Stage IV right lung adenocarcinoma: Pressure injury, stage II right ankle POA Adult failure to thrive   Procedures: TTE  Consultations: PCCM, palliative care  The results of significant diagnostics from this hospitalization (including imaging, microbiology, ancillary and laboratory) are listed below for reference.   Significant Diagnostic Studies: DG CHEST PORT 1 VIEW Result Date: 11/07/2023 CLINICAL DATA:  Dyspnea. EXAM: PORTABLE CHEST 1 VIEW COMPARISON:  11/05/2023 FINDINGS: Normal-sized heart. Extensive left lung airspace opacity with improvement. Continued dense consolidation in the left lower lobe. Interval patchy density in the right lower lung zone. Probable small bilateral pleural effusions. Thoracic spine degenerative changes. IMPRESSION: 1. Improved left lung pneumonia/atelectasis. 2. Interval right lower lung zone pneumonia or alveolar edema. 3. Probable small bilateral pleural effusions. Electronically Signed   By:  Elspeth Bathe M.D.   On: 11/07/2023 08:31   ECHOCARDIOGRAM COMPLETE Result Date: 11/06/2023    ECHOCARDIOGRAM REPORT   Patient Name:   ARJEN DERINGER [facility] Date of Exam: 11/06/2023 Medical Rec #:  [id number]         Height:       71.0 in Accession #:    [id number]        Weight:       131.2 lb Date of Birth:  Apr 23, 1940          BSA:          1.763 m Patient Age:    83 years          BP:           123/45 mmHg Patient Gender: M                 HR:           87 bpm. Exam Location:  Inpatient Procedure: 2D Echo, Cardiac Doppler and Color Doppler (Both Spectral and Color            Flow Doppler were utilized during procedure). Indications:    Dyspnea R06.00  History:        Patient has prior history of Echocardiogram examinations, most                 recent 06/21/2021. Risk Factors:Hypertension and Diabetes.  Sonographer:    Jayson Gaskins Referring Phys: [id number] Bond Grieshop J [place] IMPRESSIONS  1. Left ventricular ejection fraction, by estimation, is 60 to 65%. The left ventricle has normal function.  The left ventricle has no regional wall motion abnormalities. There is mild asymmetric left ventricular hypertrophy. Left ventricular diastolic parameters are indeterminate.  2. Right ventricular systolic function is mildly reduced. The right ventricular size is mildly enlarged. There is severely elevated pulmonary artery systolic pressure. The estimated right ventricular systolic pressure is 67.1 mmHg.  3. Left atrial size was moderately dilated.  4. Right atrial size was severely dilated.  5. The mitral valve is normal in structure. Trivial mitral valve regurgitation. No evidence of mitral stenosis.  6. Tricuspid valve regurgitation is moderate.  7. The aortic valve is heavily calcified and has very limited mobility. Visually appears to have severe AS but gradients consistent with only mild AS. Would repeat Doppler interrogation or consider TEE to further evaluate. The aortic valve is tricuspid.  There is moderate calcification of  the aortic valve. Aortic valve regurgitation is not visualized. Mild to moderate aortic valve stenosis. Aortic valve mean gradient measures 8.0 mmHg. Aortic valve Vmax measures 1.84 m/s.  8. The inferior vena cava is dilated in size with <50% respiratory variability, suggesting right atrial pressure of 15 mmHg. FINDINGS  Left Ventricle: Left ventricular ejection fraction, by estimation, is 60 to 65%. The left ventricle has normal function. The left ventricle has no regional wall motion abnormalities. The left ventricular internal cavity size was normal in size. There is  mild asymmetric left ventricular hypertrophy. Left ventricular diastolic parameters are indeterminate. Right Ventricle: The right ventricular size is mildly enlarged. No increase in right ventricular wall thickness. Right ventricular systolic function is mildly reduced. There is severely elevated pulmonary artery systolic pressure. The tricuspid regurgitant velocity is 3.61 m/s, and with an assumed right atrial pressure of 15 mmHg, the estimated right ventricular systolic pressure is 67.1 mmHg. Left Atrium: Left atrial size was moderately dilated. Right Atrium: Right atrial size was severely dilated. Pericardium: There is no evidence of pericardial effusion. Mitral Valve: The mitral valve is normal in structure. Mild mitral annular calcification. Trivial mitral valve regurgitation. No evidence of mitral valve stenosis. Tricuspid Valve: The tricuspid valve is normal in structure. Tricuspid valve regurgitation is moderate . No evidence of tricuspid stenosis. Aortic Valve: The aortic valve is heavily calcified and has very limited mobility. Visually appears to have severe AS but gradients consistent with only mild AS. Would repeat Doppler interrogation or consider TEE to further evaluate. The aortic valve is tricuspid. There is moderate calcification of the aortic valve. Aortic valve regurgitation is not visualized. Mild to moderate aortic stenosis is  present. Aortic valve mean gradient measures 8.0 mmHg. Aortic valve peak gradient measures 13.5 mmHg. Pulmonic Valve: The pulmonic valve was normal in structure. Pulmonic valve regurgitation is trivial. No evidence of pulmonic stenosis. Aorta: The aortic root is normal in size and structure. Venous: The inferior vena cava is dilated in size with less than 50% respiratory variability, suggesting right atrial pressure of 15 mmHg. IAS/Shunts: No atrial level shunt detected by color flow Doppler.  LEFT VENTRICLE PLAX 2D LVIDd:         3.60 cm LVIDs:         2.60 cm LV PW:         0.80 cm LV IVS:        1.30 cm  RIGHT VENTRICLE RV Basal diam:  4.10 cm RV Mid diam:    3.70 cm TAPSE (M-mode): 2.3 cm LEFT ATRIUM             Index  RIGHT ATRIUM           Index LA Vol (A2C):   72.8 ml 41.30 ml/m  RA Area:     27.80 cm LA Vol (A4C):   23.3 ml 13.22 ml/m  RA Volume:   96.00 ml  54.47 ml/m LA Biplane Vol: 43.9 ml 24.91 ml/m  AORTIC VALVE AV Vmax:           184.00 cm/s AV Vmean:          132.000 cm/s AV VTI:            0.335 m AV Peak Grad:      13.5 mmHg AV Mean Grad:      8.0 mmHg LVOT Vmax:         105.00 cm/s LVOT Vmean:        78.800 cm/s LVOT VTI:          0.184 m LVOT/AV VTI ratio: 0.55 MITRAL VALVE                TRICUSPID VALVE MV Area (PHT): 3.08 cm     TR Peak grad:   52.1 mmHg MV Decel Time: 246 msec     TR Vmax:        361.00 cm/s MV E velocity: 111.00 cm/s                             SHUNTS                             Systemic VTI: 0.18 m Toribio Fuel MD Electronically signed by Toribio Fuel MD Signature Date/Time: 11/06/2023/11:08:53 AM    Final    CT Chest Wo Contrast Result Date: 11/05/2023 CLINICAL DATA:  Shortness of breath. Abnormal chest radiograph with new opacification in volume loss on the left. Shortness of breath for 3 days. EXAM: CT CHEST WITHOUT CONTRAST TECHNIQUE: Multidetector CT imaging of the chest was performed following the standard protocol without IV contrast. RADIATION  DOSE REDUCTION: This exam was performed according to the departmental dose-optimization program which includes automated exposure control, adjustment of the mA and/or kV according to patient size and/or use of iterative reconstruction technique. COMPARISON:  Chest radiograph 11/05/2023.  CT chest 10/05/2023 FINDINGS: Cardiovascular: Cardiac enlargement. Small pericardial effusion. Normal caliber thoracic aorta. Calcification in the aorta and coronary arteries. Probable coronary artery stent. Mediastinum/Nodes: Esophagus is decompressed. No significant lymphadenopathy. Thyroid  gland is unremarkable. Lungs/Pleura: Moderate bilateral pleural effusions. Bilateral basilar atelectasis or consolidation, greater on the left. Air bronchograms are present. Debris demonstrated in the trachea with filling of the lower lobe bronchus. This could be due to aspirated contents, mucous plugging, or an endobronchial obstructing lesion. Follow-up after resolution of acute process is recommended. Patchy airspace infiltrates demonstrated in the right lung base and throughout the aerated left lung. This is likely multifocal pneumonia or aspiration. Diffuse emphysematous changes in the lungs. No pneumothorax. Upper Abdomen: Calcification throughout the pancreas consistent with chronic pancreatitis. An inferior vena caval filter is present. 3 mm stone in the right kidney. No hydronephrosis. Musculoskeletal: Degenerative changes in the spine. No acute bony abnormalities. Old rib fractures. IMPRESSION: 1. Moderate bilateral pleural effusions. 2. Bilateral basilar atelectasis or consolidation, greater on the left. 3. Debris in the trachea with complete filling of the left lower lobe bronchus. 4. Patchy airspace infiltrates in the right lower lung and throughout the aerated left lung with left  central consolidation. 5. Altogether, changes likely represent multifocal pneumonia with compressive atelectasis and consolidation in the lung bases.  Aspiration could also have this appearance. A left endobronchial obstructing lesion is not excluded and follow-up after resolution of acute process is recommended. 6. Underlying emphysematous changes in the lungs. 7. Cardiac enlargement with small pericardial effusion. 8. Nonobstructing stone in the right kidney. 9. Pancreatic calcification consistent with chronic pancreatitis. Electronically Signed   By: Elsie Gravely M.D.   On: 11/05/2023 20:35   DG Chest 2 View Result Date: 11/05/2023 CLINICAL DATA:  Shortness of breath for 3 days. EXAM: CHEST - 2 VIEW COMPARISON:  10/05/2023 FINDINGS: There is near complete opacification of the left lung with some residual aeration in the apex. Diffuse volume loss on the left. This is all new since previous study. This likely represents pleural effusion and collapse/consolidation of the left lung. Follow-up to resolution is recommended to exclude a centrally obstructing lesion. Compensatory hyperinflation of the right lung. No pneumothorax. Heart size is obscured but appears mildly enlarged. IMPRESSION: New finding of volume loss and near complete opacification of the left chest. This likely represents pleural effusion with collapse and consolidation in the left lung. Follow-up to resolution is recommended. Electronically Signed   By: Elsie Gravely M.D.   On: 11/05/2023 17:24    Microbiology: Recent Results (from the past 240 hours)  Culture, blood (Routine x 2)     Status: None (Preliminary result)   Collection Time: 11/05/23  5:03 PM   Specimen: BLOOD RIGHT ARM  Result Value Ref Range Status   Specimen Description   Final    BLOOD RIGHT ARM Performed at [facility], [address] [place], [place] [address]    Special Requests   Final    BOTTLES DRAWN AEROBIC AND ANAEROBIC Blood Culture adequate volume Performed at [facility], [address], [place], [place] [address]    Culture   Final    NO GROWTH 4 DAYS Performed at  [facility], [address], [place], [place] [address]    Report Status PENDING  Incomplete  Culture, blood (Routine x 2)     Status: None (Preliminary result)   Collection Time: 11/05/23  5:52 PM   Specimen: BLOOD  Result Value Ref Range Status   Specimen Description   Final    BLOOD LEFT ANTECUBITAL Performed at [facility], [address], [place], [place] [address]    Special Requests   Final    BOTTLES DRAWN AEROBIC AND ANAEROBIC Blood Culture results may not be optimal due to an inadequate volume of blood received in culture bottles Performed at [facility] [address], [place], [place] [address]    Culture   Final    NO GROWTH 4 DAYS Performed at [facility], [address], [place], [place] [address]    Report Status PENDING  Incomplete  MRSA Next Gen by PCR, Nasal     Status: None   Collection Time: 11/05/23 10:00 PM   Specimen: Nasal Mucosa; Nasal Swab  Result Value Ref Range Status   MRSA by PCR Next Gen NOT DETECTED NOT DETECTED Final    Comment: (NOTE) The GeneXpert MRSA Assay (FDA approved for NASAL specimens only), is one component of a comprehensive MRSA colonization surveillance program. It is not intended to diagnose MRSA infection nor to guide or monitor treatment for MRSA infections. Test performance is not FDA approved in patients less  than 49 years old. Performed at [facility], [address], [place], [place] [address]     Time spent: 32 minutes  Signed: Camellia PARAS [place], DO 2023-11-25

## 2023-11-29 NOTE — Plan of Care (Signed)
 Patient w/decreased LOC on comfort care. Most outcomes based on family status/outcome.  Problem: Education: Goal: Knowledge of General Education information will improve Description: Including pain rating scale, medication(s)/side effects and non-pharmacologic comfort measures Outcome: Progressing   Problem: Health Behavior/Discharge Planning: Goal: Ability to manage health-related needs will improve Outcome: Progressing   Problem: Clinical Measurements: Goal: Ability to maintain clinical measurements within normal limits will improve Outcome: Progressing Goal: Will remain free from infection Outcome: Progressing   Problem: Activity: Goal: Risk for activity intolerance will decrease Outcome: Progressing   Problem: Coping: Goal: Level of anxiety will decrease Outcome: Progressing   Problem: Pain Managment: Goal: General experience of comfort will improve and/or be controlled Outcome: Progressing   Problem: Safety: Goal: Ability to remain free from injury will improve Outcome: Progressing   Problem: Skin Integrity: Goal: Risk for impaired skin integrity will decrease Outcome: Progressing   Problem: Respiratory: Goal: Ability to maintain adequate ventilation will improve Outcome: Progressing Goal: Ability to maintain a clear airway will improve Outcome: Progressing   Problem: Education: Goal: Knowledge of the prescribed therapeutic regimen will improve Outcome: Progressing   Problem: Coping: Goal: Ability to identify and develop effective coping behavior will improve Outcome: Progressing   Problem: Clinical Measurements: Goal: Quality of life will improve Outcome: Progressing   Problem: Respiratory: Goal: Verbalizations of increased ease of respirations will increase Outcome: Progressing   Problem: Role Relationship: Goal: Family's ability to cope with current situation will improve Outcome: Progressing Goal: Ability to verbalize concerns, feelings, and  thoughts to partner or family member will improve Outcome: Progressing   Problem: Pain Management: Goal: Satisfaction with pain management regimen will improve Outcome: Progressing   Problem: Clinical Measurements: Goal: Respiratory complications will improve Outcome: Not Progressing Goal: Cardiovascular complication will be avoided Outcome: Not Progressing   Problem: Nutrition: Goal: Adequate nutrition will be maintained Outcome: Not Progressing   Problem: Activity: Goal: Ability to tolerate increased activity will improve Outcome: Not Progressing   Problem: Elimination: Goal: Will not experience complications related to bowel motility Outcome: Not Applicable Goal: Will not experience complications related to urinary retention Outcome: Not Applicable   Problem: Clinical Measurements: Goal: Ability to maintain a body temperature in the normal range will improve Outcome: Not Applicable   Cindy S. Loreli BSN, RN, CCRP, CCRN 11/11/23 5:18 AM

## 2023-11-29 DEATH — deceased

## 2023-12-04 ENCOUNTER — Encounter (HOSPITAL_COMMUNITY)

## 2023-12-04 ENCOUNTER — Encounter: Admitting: Vascular Surgery
